# Patient Record
Sex: Male | Born: 1950
Health system: Southern US, Community
[De-identification: ages and names within clinical notes are randomized; demographics above are authoritative.]

## PROBLEM LIST (undated history)

## (undated) DIAGNOSIS — I5022 Chronic systolic (congestive) heart failure: Secondary | ICD-10-CM

## (undated) DIAGNOSIS — I350 Nonrheumatic aortic (valve) stenosis: Secondary | ICD-10-CM

## (undated) DIAGNOSIS — G4733 Obstructive sleep apnea (adult) (pediatric): Secondary | ICD-10-CM

## (undated) DIAGNOSIS — N183 Chronic kidney disease, stage 3 unspecified: Secondary | ICD-10-CM

## (undated) DIAGNOSIS — Z87442 Personal history of urinary calculi: Secondary | ICD-10-CM

## (undated) DIAGNOSIS — M1990 Unspecified osteoarthritis, unspecified site: Secondary | ICD-10-CM

## (undated) DIAGNOSIS — Z952 Presence of prosthetic heart valve: Secondary | ICD-10-CM

## (undated) DIAGNOSIS — K219 Gastro-esophageal reflux disease without esophagitis: Secondary | ICD-10-CM

## (undated) DIAGNOSIS — I2510 Atherosclerotic heart disease of native coronary artery without angina pectoris: Secondary | ICD-10-CM

## (undated) DIAGNOSIS — Z8673 Personal history of transient ischemic attack (TIA), and cerebral infarction without residual deficits: Secondary | ICD-10-CM

## (undated) DIAGNOSIS — I77810 Thoracic aortic ectasia: Secondary | ICD-10-CM

## (undated) DIAGNOSIS — I5043 Acute on chronic combined systolic (congestive) and diastolic (congestive) heart failure: Secondary | ICD-10-CM

## (undated) DIAGNOSIS — I4719 Other supraventricular tachycardia: Secondary | ICD-10-CM

## (undated) DIAGNOSIS — I471 Supraventricular tachycardia: Secondary | ICD-10-CM

## (undated) DIAGNOSIS — Z923 Personal history of irradiation: Secondary | ICD-10-CM

## (undated) DIAGNOSIS — E785 Hyperlipidemia, unspecified: Secondary | ICD-10-CM

## (undated) DIAGNOSIS — N189 Chronic kidney disease, unspecified: Secondary | ICD-10-CM

## (undated) DIAGNOSIS — D649 Anemia, unspecified: Secondary | ICD-10-CM

## (undated) DIAGNOSIS — I10 Essential (primary) hypertension: Secondary | ICD-10-CM

## (undated) DIAGNOSIS — C383 Malignant neoplasm of mediastinum, part unspecified: Secondary | ICD-10-CM

## (undated) DIAGNOSIS — N529 Male erectile dysfunction, unspecified: Secondary | ICD-10-CM

## (undated) HISTORY — DX: Supraventricular tachycardia: I47.1

## (undated) HISTORY — DX: Essential (primary) hypertension: I10

## (undated) HISTORY — PX: OTHER SURGICAL HISTORY: SHX169

## (undated) HISTORY — DX: Personal history of irradiation: Z92.3

## (undated) HISTORY — DX: Thoracic aortic ectasia: I77.810

## (undated) HISTORY — DX: Chronic kidney disease, unspecified: N18.9

## (undated) HISTORY — DX: Chronic kidney disease, stage 3 unspecified: N18.30

## (undated) HISTORY — DX: Other supraventricular tachycardia: I47.19

## (undated) HISTORY — DX: Male erectile dysfunction, unspecified: N52.9

## (undated) HISTORY — DX: Malignant neoplasm of mediastinum, part unspecified: C38.3

## (undated) HISTORY — DX: Atherosclerotic heart disease of native coronary artery without angina pectoris: I25.10

## (undated) HISTORY — PX: LITHOTRIPSY: SUR834

## (undated) HISTORY — DX: Chronic systolic (congestive) heart failure: I50.22

## (undated) HISTORY — DX: Hyperlipidemia, unspecified: E78.5

## (undated) HISTORY — DX: Obstructive sleep apnea (adult) (pediatric): G47.33

---

## 1983-08-16 DIAGNOSIS — Z923 Personal history of irradiation: Secondary | ICD-10-CM

## 1983-08-16 DIAGNOSIS — C383 Malignant neoplasm of mediastinum, part unspecified: Secondary | ICD-10-CM

## 1983-08-16 HISTORY — DX: Malignant neoplasm of mediastinum, part unspecified: C38.3

## 1983-08-16 HISTORY — PX: MEDIASTERNOTOMY: SHX5084

## 1983-08-16 HISTORY — DX: Personal history of irradiation: Z92.3

## 1998-02-10 ENCOUNTER — Ambulatory Visit (HOSPITAL_COMMUNITY): Admission: RE | Admit: 1998-02-10 | Discharge: 1998-02-10 | Payer: Self-pay | Admitting: Family Medicine

## 1998-02-11 ENCOUNTER — Ambulatory Visit (HOSPITAL_COMMUNITY): Admission: RE | Admit: 1998-02-11 | Discharge: 1998-02-11 | Payer: Self-pay | Admitting: Family Medicine

## 1999-04-20 ENCOUNTER — Encounter: Admission: RE | Admit: 1999-04-20 | Discharge: 1999-07-19 | Payer: Self-pay | Admitting: Family Medicine

## 2000-03-03 ENCOUNTER — Encounter: Admission: RE | Admit: 2000-03-03 | Discharge: 2000-06-01 | Payer: Self-pay | Admitting: Family Medicine

## 2000-06-02 ENCOUNTER — Encounter: Admission: RE | Admit: 2000-06-02 | Discharge: 2000-08-31 | Payer: Self-pay | Admitting: Family Medicine

## 2000-11-01 ENCOUNTER — Encounter: Admission: RE | Admit: 2000-11-01 | Discharge: 2001-01-30 | Payer: Self-pay | Admitting: Family Medicine

## 2002-02-22 ENCOUNTER — Encounter: Admission: RE | Admit: 2002-02-22 | Discharge: 2002-02-22 | Payer: Self-pay | Admitting: Gastroenterology

## 2002-02-22 ENCOUNTER — Encounter: Payer: Self-pay | Admitting: Gastroenterology

## 2003-09-29 ENCOUNTER — Encounter (HOSPITAL_BASED_OUTPATIENT_CLINIC_OR_DEPARTMENT_OTHER): Admission: RE | Admit: 2003-09-29 | Discharge: 2003-10-09 | Payer: Self-pay | Admitting: Internal Medicine

## 2008-08-11 ENCOUNTER — Encounter: Admission: RE | Admit: 2008-08-11 | Discharge: 2008-08-11 | Payer: Self-pay | Admitting: Family Medicine

## 2008-09-03 ENCOUNTER — Ambulatory Visit: Payer: Self-pay

## 2008-09-03 ENCOUNTER — Encounter (INDEPENDENT_AMBULATORY_CARE_PROVIDER_SITE_OTHER): Payer: Self-pay | Admitting: Neurology

## 2010-07-02 ENCOUNTER — Emergency Department: Payer: Self-pay | Admitting: Unknown Physician Specialty

## 2010-07-04 ENCOUNTER — Emergency Department: Payer: Self-pay | Admitting: Emergency Medicine

## 2010-08-12 ENCOUNTER — Ambulatory Visit (HOSPITAL_COMMUNITY)
Admission: RE | Admit: 2010-08-12 | Discharge: 2010-08-12 | Payer: Self-pay | Source: Home / Self Care | Attending: Gastroenterology | Admitting: Gastroenterology

## 2010-10-25 LAB — GLUCOSE, CAPILLARY
Glucose-Capillary: 128 mg/dL — ABNORMAL HIGH (ref 70–99)
Glucose-Capillary: 198 mg/dL — ABNORMAL HIGH (ref 70–99)

## 2010-12-31 NOTE — Consult Note (Signed)
NAME:  [name], [name] NO.:  [id number]   MEDICAL RECORD NO.:  [id number]                   PATIENT TYPE:  REC   LOCATION:  FOOT                                 FACILITY:  [facility]   PHYSICIAN:  Jonelle Sports. Sevier, M.D.              DATE OF BIRTH:  1951/05/27   DATE OF CONSULTATION:  10/02/2003  DATE OF DISCHARGE:                                   CONSULTATION   HISTORY:  This 60 year old white male was seen at the courtesy of Dr. Talmage Nap  for foot evaluation and education as well as to establish for ongoing nail  care.   The patient has had diabetes of some 12 years duration and apparently has  only recently decided to get serious about self care.  His A1C in early  January, 2004, was 9.1, but he states that he has made some substantial  changes in his behavior since that time and that recent blood sugars have  been consistent with below 150.  He apparently is exercising with some  regularity, although, his walking is on simply a three day a week basis.  He  apparently does not have hypertension.  He apparently does have some degree  of hyperlipidemia and is on Lipitor 5 mg per day.   The patient has not had any previous difficulty with his feet other than  some tingling in the toes which began some six years ago.  He has never  received any specific treatment for that (although, he is on amitriptyline,  he says that is to help with sleep).  He has never had significant callus  formation, ulcerations, or any significant deformity of the feet.   As stated above, he is here for education and to establish for routine nail  care.   PAST MEDICAL HISTORY:  Really is notable only for his diabetes with mild  neuropathy.   ALLERGIES:  He has no known medicinal allergies.   His medications include:  1. Lipitor as above.  2. Amitriptyline, an uncertain dose at bedtime.  3. __________, again, an uncertain dose on a p.r.n. basis.  4. Altace 5 mg daily (which is  apparently for renal protection).  5. Amaryl 4 mg daily.  6. Actos 45 mg daily.  7. Metformin 1000 mg b.i.d.  8. Lantus 35 units at bedtime.   EXAMINATION:  EXTREMITIES:  Examination today is limited to the distal lower  extremities.  The patient's feet are without significant deformity and free  of edema.  Skin temperatures are in the normal range and are symmetrical  throughout.  Pulses are everywhere palpable and quite adequate.  There is  good hair growth on the toes.  Other nails show no significant dystrophy.  On the plantar aspects of the feet, there is minor very early callus  formation that does not need attention at the fifth metatarsal head area on  the right and the  first metatarsal head area on the left.   1. Monofilament testing on the plantar aspects of the feet shows that there     is some variable response over the metatarsal heads but that he does have     protective sensation throughout the remainder of the feet.  2. Assessment of his footwear shows that his current shoes are probably at     least one-half size too short, and this is discussed with him.   IMPRESSION:  1. Diabetes mellitus type 2, uncontrolled but, hopefully, improving with     mild peripheral neuropathy.  2. Hyperlipidemia on treatment.   DISPOSITION:  1. The patient is given instruction regarding foot care and diabetes by     video with nurse and physician reinforcement.  2. His need to be sure of the adequacy of the length and width of his     footwear is again re-emphasized, and he is specifically instructed in his     current shoes to seek at least one-half size additional length when he     replaces them.  3. The patient is given nail care by the nurse today and will be set up for     routine nail care here on a three-month basis.  4. Follow up beyond that will be on a p.r.n. basis.                                               Jonelle Sports. Cheryll Cockayne, M.D.    RES/MEDQ  D:  10/02/2003  T:   10/03/2003  Job:  [id number]   cc:   Dorisann Frames, M.D.  [facility] [address], [place] [address]  Fax: [phone]

## 2012-02-13 DIAGNOSIS — I2510 Atherosclerotic heart disease of native coronary artery without angina pectoris: Secondary | ICD-10-CM | POA: Insufficient documentation

## 2012-02-13 HISTORY — DX: Atherosclerotic heart disease of native coronary artery without angina pectoris: I25.10

## 2012-03-06 ENCOUNTER — Encounter (HOSPITAL_COMMUNITY): Payer: Self-pay | Admitting: General Practice

## 2012-03-06 ENCOUNTER — Encounter: Payer: Self-pay | Admitting: Cardiology

## 2012-03-06 ENCOUNTER — Inpatient Hospital Stay (HOSPITAL_COMMUNITY)
Admission: EM | Admit: 2012-03-06 | Discharge: 2012-03-07 | DRG: 247 | Disposition: A | Discharge status: Home / Self Care | Payer: 59 | Source: Ambulatory Visit | Attending: Cardiology | Admitting: Cardiology

## 2012-03-06 ENCOUNTER — Encounter (HOSPITAL_COMMUNITY)
Admission: EM | Disposition: A | Discharge status: Home / Self Care | Payer: Self-pay | Source: Ambulatory Visit | Attending: Cardiology

## 2012-03-06 ENCOUNTER — Other Ambulatory Visit: Payer: Self-pay | Admitting: Cardiology

## 2012-03-06 DIAGNOSIS — E1142 Type 2 diabetes mellitus with diabetic polyneuropathy: Secondary | ICD-10-CM | POA: Diagnosis present

## 2012-03-06 DIAGNOSIS — R0602 Shortness of breath: Secondary | ICD-10-CM | POA: Diagnosis present

## 2012-03-06 DIAGNOSIS — I10 Essential (primary) hypertension: Secondary | ICD-10-CM | POA: Diagnosis present

## 2012-03-06 DIAGNOSIS — Z955 Presence of coronary angioplasty implant and graft: Secondary | ICD-10-CM

## 2012-03-06 DIAGNOSIS — I2584 Coronary atherosclerosis due to calcified coronary lesion: Secondary | ICD-10-CM | POA: Diagnosis present

## 2012-03-06 DIAGNOSIS — Z7902 Long term (current) use of antithrombotics/antiplatelets: Secondary | ICD-10-CM

## 2012-03-06 DIAGNOSIS — G4700 Insomnia, unspecified: Secondary | ICD-10-CM | POA: Diagnosis present

## 2012-03-06 DIAGNOSIS — E7800 Pure hypercholesterolemia, unspecified: Secondary | ICD-10-CM | POA: Diagnosis present

## 2012-03-06 DIAGNOSIS — Z79899 Other long term (current) drug therapy: Secondary | ICD-10-CM

## 2012-03-06 DIAGNOSIS — I214 Non-ST elevation (NSTEMI) myocardial infarction: Secondary | ICD-10-CM

## 2012-03-06 DIAGNOSIS — Z8673 Personal history of transient ischemic attack (TIA), and cerebral infarction without residual deficits: Secondary | ICD-10-CM

## 2012-03-06 DIAGNOSIS — E1149 Type 2 diabetes mellitus with other diabetic neurological complication: Secondary | ICD-10-CM | POA: Diagnosis present

## 2012-03-06 DIAGNOSIS — I2510 Atherosclerotic heart disease of native coronary artery without angina pectoris: Principal | ICD-10-CM

## 2012-03-06 DIAGNOSIS — I200 Unstable angina: Secondary | ICD-10-CM | POA: Diagnosis present

## 2012-03-06 HISTORY — DX: Gastro-esophageal reflux disease without esophagitis: K21.9

## 2012-03-06 HISTORY — PX: LEFT HEART CATHETERIZATION WITH CORONARY ANGIOGRAM: SHX5451

## 2012-03-06 LAB — GLUCOSE, CAPILLARY
Glucose-Capillary: 102 mg/dL — ABNORMAL HIGH (ref 70–99)
Glucose-Capillary: 91 mg/dL (ref 70–99)
Glucose-Capillary: 172 mg/dL — ABNORMAL HIGH (ref 70–99)

## 2012-03-06 LAB — CARDIAC PANEL(CRET KIN+CKTOT+MB+TROPI)
Relative Index: 1.6 (ref 0.0–2.5)
Relative Index: 1.6 (ref 0.0–2.5)
Total CK: 318 U/L — ABNORMAL HIGH (ref 7–232)
Troponin I: <0.3 ng/mL (ref <0.30)

## 2012-03-06 LAB — COMPREHENSIVE METABOLIC PANEL
AST: 15 U/L (ref 0–37)
Albumin: 3.7 g/dL (ref 3.5–5.2)
BUN: 17 mg/dL (ref 6–23)
Creatinine, Ser: 1.36 mg/dL — ABNORMAL HIGH (ref 0.50–1.35)
Potassium: 4.5 mEq/L (ref 3.5–5.1)
Total Protein: 6.9 g/dL (ref 6.0–8.3)

## 2012-03-06 LAB — CBC WITH DIFFERENTIAL/PLATELET
Basophils Absolute: 0 10*3/uL (ref 0.0–0.1)
Basophils Relative: 1 % (ref 0–1)
Eosinophils Absolute: 0.1 10*3/uL (ref 0.0–0.7)
Hemoglobin: 13.5 g/dL (ref 13.0–17.0)
MCH: 26.4 pg (ref 26.0–34.0)
MCHC: 33.4 g/dL (ref 30.0–36.0)
Monocytes Absolute: 0.2 10*3/uL (ref 0.1–1.0)
Monocytes Relative: 7 % (ref 3–12)
Neutro Abs: 1.3 10*3/uL — ABNORMAL LOW (ref 1.7–7.7)
Neutrophils Relative %: 43 % (ref 43–77)
RDW: 15.5 % (ref 11.5–15.5)

## 2012-03-06 LAB — POCT ACTIVATED CLOTTING TIME: Activated Clotting Time: 354 seconds

## 2012-03-06 LAB — PROTIME-INR
INR: 0.99 (ref 0.00–1.49)
Prothrombin Time: 13.3 seconds (ref 11.6–15.2)

## 2012-03-06 LAB — APTT: aPTT: 31 seconds (ref 24–37)

## 2012-03-06 LAB — MRSA PCR SCREENING: MRSA by PCR: NEGATIVE

## 2012-03-06 LAB — PLATELET INHIBITION P2Y12: Platelet Function  P2Y12: 97 [PRU] — ABNORMAL LOW (ref 194–418)

## 2012-03-06 SURGERY — LEFT HEART CATHETERIZATION WITH CORONARY ANGIOGRAM
Anesthesia: LOCAL

## 2012-03-06 MED ORDER — ONDANSETRON HCL 4 MG/2ML IJ SOLN: 4.0000 mg | Freq: Four times a day (QID) | INTRAMUSCULAR | Status: DC | PRN

## 2012-03-06 MED ORDER — ACETAMINOPHEN 325 MG PO TABS: 650.0000 mg | ORAL_TABLET | ORAL | Status: DC | PRN

## 2012-03-06 MED ORDER — ASPIRIN EC 81 MG PO TBEC: 81.0000 mg | DELAYED_RELEASE_TABLET | Freq: Every day | ORAL | Status: DC

## 2012-03-06 MED ORDER — LIDOCAINE HCL (PF) 1 % IJ SOLN
INTRAMUSCULAR | Status: AC
  Filled 2012-03-06: qty 30

## 2012-03-06 MED ORDER — MIDAZOLAM HCL 2 MG/2ML IJ SOLN
INTRAMUSCULAR | Status: AC
  Filled 2012-03-06: qty 2

## 2012-03-06 MED ORDER — BIVALIRUDIN 250 MG IV SOLR
INTRAVENOUS | Status: AC
  Filled 2012-03-06: qty 250

## 2012-03-06 MED ORDER — INSULIN DETEMIR 100 UNIT/ML ~~LOC~~ SOLN
40.0000 [IU] | Freq: Every day | SUBCUTANEOUS | Status: DC
  Administered 2012-03-06: 40 [IU] via SUBCUTANEOUS
  Filled 2012-03-06: qty 10

## 2012-03-06 MED ORDER — ASPIRIN 81 MG PO CHEW: 324.0000 mg | CHEWABLE_TABLET | ORAL | Status: DC

## 2012-03-06 MED ORDER — DIAZEPAM 2 MG PO TABS
2.0000 mg | ORAL_TABLET | ORAL | Status: AC
  Administered 2012-03-06: 2 mg via ORAL
  Filled 2012-03-06: qty 1

## 2012-03-06 MED ORDER — ACETAMINOPHEN 325 MG PO TABS
650.0000 mg | ORAL_TABLET | ORAL | Status: DC | PRN
  Filled 2012-03-06: qty 1

## 2012-03-06 MED ORDER — LISINOPRIL 2.5 MG PO TABS
2.5000 mg | ORAL_TABLET | Freq: Every day | ORAL | Status: DC
  Administered 2012-03-06 – 2012-03-07 (×2): 2.5 mg via ORAL
  Filled 2012-03-06 (×2): qty 1

## 2012-03-06 MED ORDER — INSULIN ASPART 100 UNIT/ML ~~LOC~~ SOLN
0.0000 [IU] | Freq: Three times a day (TID) | SUBCUTANEOUS | Status: DC
  Administered 2012-03-07: 3 [IU] via SUBCUTANEOUS

## 2012-03-06 MED ORDER — ASPIRIN EC 325 MG PO TBEC: 325.0000 mg | DELAYED_RELEASE_TABLET | Freq: Every day | ORAL | Status: DC

## 2012-03-06 MED ORDER — HEPARIN (PORCINE) IN NACL 100-0.45 UNIT/ML-% IJ SOLN
1000.0000 [IU]/h | INTRAMUSCULAR | Status: DC
Start: 2012-03-06 — End: 2012-03-06
  Administered 2012-03-06: 1000 [IU]/h via INTRAVENOUS
  Filled 2012-03-06: qty 250

## 2012-03-06 MED ORDER — SODIUM CHLORIDE 0.9 % IV SOLN
1.0000 mL/kg/h | INTRAVENOUS | Status: AC
  Administered 2012-03-06: 1 mL/kg/h via INTRAVENOUS

## 2012-03-06 MED ORDER — NITROGLYCERIN 0.4 MG SL SUBL: 0.4000 mg | SUBLINGUAL_TABLET | SUBLINGUAL | Status: DC | PRN

## 2012-03-06 MED ORDER — SODIUM CHLORIDE 0.9 % IV SOLN
INTRAVENOUS | Status: DC
  Administered 2012-03-06: 75 mL/h via INTRAVENOUS

## 2012-03-06 MED ORDER — AMITRIPTYLINE HCL 100 MG PO TABS
100.0000 mg | ORAL_TABLET | Freq: Every day | ORAL | Status: DC
  Filled 2012-03-06 (×2): qty 1

## 2012-03-06 MED ORDER — ASPIRIN 300 MG RE SUPP
300.0000 mg | RECTAL | Status: DC
  Filled 2012-03-06: qty 1

## 2012-03-06 MED ORDER — SODIUM CHLORIDE 0.9 % IV SOLN: 250.0000 mL | INTRAVENOUS | Status: DC | PRN

## 2012-03-06 MED ORDER — SODIUM CHLORIDE 0.9 % IJ SOLN: 3.0000 mL | INTRAMUSCULAR | Status: DC | PRN

## 2012-03-06 MED ORDER — HEPARIN BOLUS VIA INFUSION
4000.0000 [IU] | Freq: Once | INTRAVENOUS | Status: AC
  Administered 2012-03-06: 4000 [IU] via INTRAVENOUS
  Filled 2012-03-06: qty 4000

## 2012-03-06 MED ORDER — ZOLPIDEM TARTRATE 5 MG PO TABS: 10.0000 mg | ORAL_TABLET | Freq: Every evening | ORAL | Status: DC | PRN

## 2012-03-06 MED ORDER — SODIUM CHLORIDE 0.9 % IJ SOLN
3.0000 mL | Freq: Two times a day (BID) | INTRAMUSCULAR | Status: DC
  Administered 2012-03-06 – 2012-03-07 (×3): 3 mL via INTRAVENOUS

## 2012-03-06 MED ORDER — SIMVASTATIN 40 MG PO TABS
40.0000 mg | ORAL_TABLET | Freq: Every day | ORAL | Status: DC
  Filled 2012-03-06: qty 1

## 2012-03-06 MED ORDER — POLYSACCHARIDE IRON COMPLEX 150 MG PO CAPS
150.0000 mg | ORAL_CAPSULE | Freq: Every day | ORAL | Status: DC
  Administered 2012-03-06 – 2012-03-07 (×2): 150 mg via ORAL
  Filled 2012-03-06 (×2): qty 1

## 2012-03-06 MED ORDER — PANTOPRAZOLE SODIUM 40 MG PO TBEC
40.0000 mg | DELAYED_RELEASE_TABLET | Freq: Every day | ORAL | Status: DC
  Administered 2012-03-07: 40 mg via ORAL
  Filled 2012-03-06: qty 1

## 2012-03-06 MED ORDER — ATROPINE SULFATE 1 MG/ML IJ SOLN
INTRAMUSCULAR | Status: AC
  Filled 2012-03-06: qty 1

## 2012-03-06 MED ORDER — CLOPIDOGREL BISULFATE 300 MG PO TABS
ORAL_TABLET | ORAL | Status: AC
  Filled 2012-03-06: qty 1

## 2012-03-06 MED ORDER — FENTANYL CITRATE 0.05 MG/ML IJ SOLN
INTRAMUSCULAR | Status: AC
  Filled 2012-03-06: qty 2

## 2012-03-06 MED ORDER — CLOPIDOGREL BISULFATE 75 MG PO TABS: 75.0000 mg | ORAL_TABLET | Freq: Every day | ORAL | Status: DC

## 2012-03-06 MED ORDER — ASPIRIN EC 325 MG PO TBEC
325.0000 mg | DELAYED_RELEASE_TABLET | Freq: Every day | ORAL | Status: DC
  Administered 2012-03-06 – 2012-03-07 (×2): 325 mg via ORAL
  Filled 2012-03-06 (×2): qty 1

## 2012-03-06 MED ORDER — HEPARIN (PORCINE) IN NACL 2-0.9 UNIT/ML-% IJ SOLN
INTRAMUSCULAR | Status: AC
  Filled 2012-03-06: qty 2000

## 2012-03-06 MED ORDER — CLOPIDOGREL BISULFATE 75 MG PO TABS
75.0000 mg | ORAL_TABLET | Freq: Every day | ORAL | Status: DC
  Administered 2012-03-07: 75 mg via ORAL
  Filled 2012-03-06: qty 1

## 2012-03-06 MED ORDER — NITROGLYCERIN 0.2 MG/ML ON CALL CATH LAB
INTRAVENOUS | Status: AC
  Filled 2012-03-06: qty 1

## 2012-03-06 MED ORDER — MORPHINE SULFATE 2 MG/ML IJ SOLN: 1.0000 mg | INTRAMUSCULAR | Status: DC | PRN

## 2012-03-06 MED ORDER — SODIUM CHLORIDE 0.9 % IJ SOLN: 3.0000 mL | Freq: Two times a day (BID) | INTRAMUSCULAR | Status: DC

## 2012-03-06 NOTE — H&P (View-Only) (Signed)
Progress Notes     Patient: [name] Provider: Carmine Carrozza, MD  DOB: 06/25/1951 Age: 61 Y Sex: Male Date: 03/06/2012  Phone: [phone]   Address: [address], [place], [place]  Pcp: DIBAS KOIRALA       Subjective:     CC:    1. TT/urgent work in Dr. Koirala/cp.        HPI:  General:  The patient presents today for evaluation of chest pain. This has been occurring for about 6 months off and on but recently has been occurring more frequently and is now occurring daily with any type of exertion. Occasionally he will have some pressure on his chest when he lays down at night. He gets SOB with the CP but no nausea or diaphoresis. The pain is located midsternal with no radiation. He currently complains of 3/10 chest pain at rest which has been going on for about 30 minutes..        ROS:  See HPI, A twelve system review was perfomed at today's visit. For pertinent positives and negatives see HPI.       Medical History: Health maintenance, diabetes mellitus with neuropathy, (Dr Balan) sees Dr. Balan, Allergies, /insomnia, Erectile dysfunction, Hypertension, Elevated cholesterol, Kidney stones-lithotripsy plan april/2012,dr,kimbrough, Colon due 6-16, history of TIAs.        Gyn History:        OB History:        Surgical History: Mediastinal Seminoma removed (benign) 1985, colonoscopy- 02/11/10, 10/12/07, 10/18/06 , EGD- 08/05/10 , kidney stone 08/2010.        Hospitalization/Major Diagnostic Procedure: only as above , no hospitalizations in the past year in 2008 , not in past year 03/2011.        Family History: Father: deceased 76 yrs diabetes Mother: deceased 69 yrs diabetes, hypertension, CHF Brother 1: deceased 54 yrs Cancer Pancreatic Brother2: alive 56 yrs A + W Sister 1: alive 57 yrs A + W Sister 2: deceased 58 yrs diabetes  denies fam hx colon cancer, polyps, or liver dx-eb.       Social History:  General:  History of smoking  cigarettes: Former  smoker Quit in year 1986 no Smoking.  no Alcohol.  Caffeine: yes, coffee, 1 serving daily.  no Recreational drug use.  no Diet.  Exercise: yes, walks, eliptical, 4x a week.  Occupation: employed, [employer].  Marital Status: married, twice. This time for 15 years. "It is what it is.".  Children: 2, son (s).  Seat belt use: yes.  Originally from [place], [place].       Medications: Multi For Him Tablet 1 tablet once a day, Plavix 75 MG Tablet 1 tablet Once a day, Simvastatin 40 MG Tablet 1 tablet every evening Once a day, Amitriptyline HCl 100 MG Tablet 1tablet at bedtime, Ambien 10 MG Tablet 1 tablet at bedtime 30mintues before bedtime, qhs prn insomnia, Nu-Iron 150 MG Capsule 1 capsules once a day, metformin 500 mg tab 2 tablets twice a day, NovoLog 100 UNIT/ML Solution as directed , Levemir 100 UNIT/ML Solution 40units at bedtime, Lisinopril 2.5 MG Tablet 1 tablet once a day, Omeprazole 20 MG Capsule Delayed Release 1 capsule PRN, Nitroglycerin 0.4 mg 0.4 mg tablet 1 tablet as directed as directed every 5 minutes x 3 doses prn chest pain, Medication List reviewed and reconciled with the patient       Allergies: Lipitor: cramps: Side Effects.       Objective:       Vitals: Wt 190.6, Wt change 1.2 lb, Ht 69.75, BMI 27.54, Pulse sitting 106, BP sitting 102/72.       Examination:  Cardiology, General:  GENERAL APPEARANCE: pleasant, NAD.  HEENT: unremarkable.  CAROTID UPSTROKE: normal, no bruit.  JVD: flat.  HEART SOUNDS: regular, normal S1, S2, no S3 or S4.  MURMUR: absent.  LUNGS: no rales or wheezes.  ABDOMEN: soft, non tender, positive bowel sounds, no masses felt.  EXTREMITIES: no leg edema.  PERIPHERAL PULSES: 2 plus bilateral.        Assessment:     Assessment:  1. Chest pain - 786.50 (Primary)  2. Essential hypertension, benign - 401.1  3. SOBOE (shortness of breath on exertion) - 786.05    Plan:     1. Chest pain  I am very concerned about  his symptoms which are consistent with unstable angina. He currently having pain. I have recommended transporting the patient to the hospital to be admitted to a stepdown tele bed and plan cardiac cath later today. Risks and benefits of cardiac catheterization have been reviewed including risk of stroke, heart attack, death, bleeding, renal impariment and arterial damage. There was ample oppurtuny to answer questions. Alternatives were discussed. Patient understands and wishes to proceed. I wil Start IV Heparin GTT per pharmacy and IV NTG gtt as well as ASA. Hold Metformin. Continue Plavix/statin and ACE I.       2. Essential hypertension, benign Continue Lisinopril Tablet, 2.5 MG, 1 tablet, Orally, once a day .        Immunizations:        Labs:        Preventive:         Follow Up: admit      Provider: Addalyne Vandehei, MD  Patient: [name] DOB: 03/12/1951 Date: 03/06/2012    

## 2012-03-06 NOTE — CV Procedure (Signed)
PROCEDURE:  PCI proximal left circumflex and proximal OM1  INDICATIONS:  Unstable angina  The risks, benefits, and details of the procedure were explained to the patient.  The patient verbalized understanding and wanted to proceed.  Informed written consent was obtained.  Dr. Mayford Knife performed the diagnostic cath revealing a 90% circumflex lesion and a calcified 90% OM1 lesion.    PROCEDURE TECHNIQUE:  6Fr sheath used.  Angiomax for anticoagulation.  ACT was used to confirm that the Angiomax was therapeutic.  A CLS 3.5 guiding catheter was used to engage the left main.  A pro-water wire was placed across the entire diseased area in the proximal circumflex and OM1.  We attempted to advance an Angio-Sculpt 2.5 x 10 balloon.  This would not successfully crossed the proximal circumflex lesion.  A 2.5 x 12 emerge balloon was successfully used to predilate both lesions.  A 2.5 x 12 Promus element stent was advanced to the circumflex but would not cross the proximal lesion.  A BMW wire was placed down the vessel and the same stent still would not cross.  A guide liner was advanced to the proximal circumflex but would not advance into the mid circumflex or OM1.  The proximal circumflex was again predilated with a 2.5 x 12 emerge balloon. There was some moderate disease and likely tortuosity in the proximal circumflex which is making delivery of devices difficult.  A 2.25 x 12 xience expedition stent was then successfully advanced to the OM1 after the BMW wire was replaced across the area of disease.  This was deployed at 18 atmospheres.  A 2.25 x 12 sinus expedition stent was then used to try to stent the proximal circumflex lesion.  There is difficulty in getting this stent to go down across the area of disease.  The BMW wire was replaced and with a deep breath, the stent went to the proximal circumflex lesion.  This was deployed at 20 atmospheres.  A 3.0 x 8 [place] trek balloon was used to post dilate both stented  areas.  The distal area was postdilated to 10 atmospheres.  The proximal stent was postdilated to 14 atmospheres.  Several doses of intracoronary nitroglycerin were answered to treat vasospasm in the proximal circumflex which was caused by multiple attempts at advancing equipment.   CONTRAST:  Total of 280 cc.  COMPLICATIONS:  None.      IMPRESSIONS:   1. Severe disease in the proximal left circumflex artery and proximal first obtuse marginal.  These 2 areas were spot stented with two 2.25 x 12 science expedition stents.  There were both postdilated to greater than 3 mm in diameter.  There was significant difficulty in delivering stents through this heavily calcified artery.  There is moderate disease in the remainder of the OM1.  RECOMMENDATION:  The patient will need aggressive medical therapy.  He'll be hydrated and watched overnight.  Continue aggressive diabetes control.  He'll need dual antiplatelet therapy for at least a year.  He already takes Plavix on a daily basis.

## 2012-03-06 NOTE — CV Procedure (Signed)
PROCEDURE:  Left heart catheterization with selective coronary angiography, left ventriculogram.  INDICATIONS:    The risks, benefits, and details of the procedure were explained to the patient.  The patient verbalized understanding and wanted to proceed.  Informed written consent was obtained.  PROCEDURE TECHNIQUE:  After Xylocaine anesthesia a 68F sheath was placed in the right femoral artery with a single anterior needle wall stick.   Left coronary angiography was done using a Judkins L4 guide catheter.  Right coronary angiography was done using a Judkins R4 guide catheter.  Left ventriculography was done using a pigtail catheter.    CONTRAST:  Total of 100 cc.  COMPLICATIONS:  None.    HEMODYNAMICS:  Aortic pressure was 125/34mmHg; LV pressure was 131/16mmHg; LVEDP .  There was no gradient between the left ventricle and aorta.    ANGIOGRAPHIC DATA:   The left main coronary artery is short and calcified but widely patent.  It bifurcates into a left circumflex and left anterior descending arteries.  The left anterior descending artery is calcified in its proximal portion.  It is widely patent and gives rise to a large diagonal #1 which is widely patent and a diagonal #2 which is patent as well.  The ongoing LAD is patent and gives rise to a diagonal #3 which is moderate in size and patent.    The left circumflex artery is calcified.  There is an 80% stenosis proximally before the takeoff of a very large OM branch.  The left circumflex then gives rise to a large OM branch which has an 80% proximal stenosis.  The ongoing left circumflex has 99% stenosis ans is a moderate sized vessel.  The OM branch is the largest of the left circ system supplying a large area of myocardium posterolaterally.  The right coronary artery is calcified and gives rise to an acute RV marginal branch.  The mid RCA has a 60% narrowing and distally is patent and bifurcates into a PL and PDA branches which are  patent.  LEFT VENTRICULOGRAM:  Left ventricular angiogram was done in the 30 RAO projection and revealed normal left ventricular wall motion and systolic function with an estimated ejection fraction of 55%.  LVEDP was 10 mmHg.  IMPRESSIONS:  1. Calcified and short patent left main coronary artery. 2. Normal left anterior descending artery and its branches. 3. 80% proximal left circumflex and 99% distal left circumflex stenosis.  80% proximal OM1 stenosis. 4. 60% stenosis of mid RCA 5. Normal left ventricular systolic function.  LVEDP 10 mmHg.  Ejection fraction 55%.  RECOMMENDATION:   Films were reviewed with Dr. Eldridge Dace and will proceed with PCI of left circumflex/OM. Continue Plavix and ASA

## 2012-03-06 NOTE — Progress Notes (Signed)
ANTICOAGULATION CONSULT NOTE - Initial Consult  Pharmacy Consult for heparin Indication: chest pain/ACS  Allergies  Allergen Reactions  . Lipitor (Atorvastatin) Other (See Comments)    myalgias    Patient Measurements: Height: 5\' 11"  (180.3 cm) Weight: 183 lb 6.8 oz (83.2 kg) IBW/kg (Calculated) : 75.3  Heparin Dosing Weight:83.2 kg  Vital Signs: BP: 123/66 mmHg (07/23 1300) Pulse Rate: 112  (07/23 1300)  Labs: No results found for this basename: HGB:2,HCT:3,PLT:3,APTT:3,LABPROT:3,INR:3,HEPARINUNFRC:3,CREATININE:3,CKTOTAL:3,CKMB:3,TROPONINI:3 in the last 72 hours  CrCl is unknown because no creatinine reading has been taken.   Medical History: Past Medical History  Diagnosis Date  . Diabetes mellitus     neuropathy  . Erectile dysfunction   . Hypertension   . Dyslipidemia   . Chronic kidney disease     kidney stones s/p lithotripsy  . Stroke     TIA's  . Cancer     mediastinal seminoma resected 1985 - benign    Medications:  Prescriptions prior to admission  Medication Sig Dispense Refill  . amitriptyline (ELAVIL) 100 MG tablet Take 100 mg by mouth at bedtime.      . clopidogrel (PLAVIX) 75 MG tablet Take 75 mg by mouth daily.      . insulin detemir (LEVEMIR) 100 UNIT/ML injection Inject 40 Units into the skin at bedtime.      . iron polysaccharides (NIFEREX) 150 MG capsule Take 150 mg by mouth daily.      Marland Kitchen lisinopril (PRINIVIL,ZESTRIL) 2.5 MG tablet Take 2.5 mg by mouth daily.      . metFORMIN (GLUCOPHAGE) 500 MG tablet Take 500 mg by mouth 2 (two) times daily with a meal. 2 tablets twice daily      . omeprazole (PRILOSEC) 20 MG capsule Take 20 mg by mouth daily as needed.      . simvastatin (ZOCOR) 40 MG tablet Take 40 mg by mouth every evening.      . zolpidem (AMBIEN) 10 MG tablet Take 10 mg by mouth at bedtime as needed.        Assessment: 61 yo man admitted with chest pain, r/o [place] to start heparin drip. Wt 83.2 kg.  No baseline labs drawn yet.  Goal  of Therapy:  Heparin level 0.3-0.7 units/ml Monitor platelets by anticoagulation protocol: Yes   Plan:  Give 4000 units bolus x 1 Start heparin infusion at 1000 units/hr Check anti-Xa level in 6 hours and daily while on heparin Continue to monitor H&H and platelets

## 2012-03-06 NOTE — Interval H&P Note (Signed)
History and Physical Interval Note:  03/06/2012 3:53 PM  [name]  has presented today for surgery, with the diagnosis of chest pain  The various methods of treatment have been discussed with the patient and family. After consideration of risks, benefits and other options for treatment, the patient has consented to  Procedure(s) (LRB): LEFT HEART CATHETERIZATION WITH CORONARY ANGIOGRAM (N/A) as a surgical intervention .  The patient's history has been reviewed, patient examined, no change in status, stable for surgery.  I have reviewed the patient's chart and labs.  Questions were answered to the patient's satisfaction.     TURNER,[name]

## 2012-03-06 NOTE — H&P (Signed)
Progress Notes     Patient: [name] [name] Provider: Armanda Magic, MD  DOB: Oct 09, 1950 Age: 61 Y Sex: Male Date: 03/06/2012  Phone: [phone]   Address: [address] [place], [place]  Pcp: Darrow Bussing       Subjective:     CC:    1. TT/urgent work in Dr. Ranell Patrick.        HPI:  General:  The patient presents today for evaluation of chest pain. This has been occurring for about 6 months off and on but recently has been occurring more frequently and is now occurring daily with any type of exertion. Occasionally he will have some pressure on his chest when he lays down at night. He gets SOB with the CP but no nausea or diaphoresis. The pain is located midsternal with no radiation. He currently complains of 3/10 chest pain at rest which has been going on for about 30 minutes..        ROS:  See HPI, A twelve system review was perfomed at today's visit. For pertinent positives and negatives see HPI.       Medical History: Health maintenance, diabetes mellitus with neuropathy, (Dr Talmage Nap) sees Dr. Talmage Nap, Allergies, [place] Ahr, Erectile dysfunction, Hypertension, Elevated cholesterol, Kidney stones-lithotripsy plan april/2012,dr,kimbrough, Colon due 6-16, history of TIAs.        Gyn History:        OB History:        Surgical History: Mediastinal Seminoma removed (benign) 1985, colonoscopy- 02/11/10, 10/12/07, 10/18/06 , EGD- 08/05/10 , kidney stone 08/2010.        Hospitalization/Major Diagnostic Procedure: only as above , no hospitalizations in the past year in 2008 , not in past year 03/2011.        Family History: Father: deceased 41 yrs diabetes Mother: deceased 66 yrs diabetes, hypertension, CHF Brother 1: deceased 61 yrs Cancer Pancreatic Brother2: alive 46 yrs A + W Sister 1: alive 59 yrs A + W Sister 2: deceased 80 yrs diabetes  denies fam hx colon cancer, polyps, or liver dx-eb.       Social History:  General:  History of smoking  cigarettes: Former  smoker Quit in year 1986 no Smoking.  no Alcohol.  Caffeine: yes, coffee, 1 serving daily.  no Recreational drug use.  no Diet.  Exercise: yes, walks, eliptical, 4x a week.  Occupation: employed, [employer].  Marital Status: married, twice. This time for 15 years. "It is what it is.".  Children: 2, son (s).  Seat belt use: yes.  Originally from [place], [place].       Medications: Multi For Him Tablet 1 tablet once a day, Plavix 75 MG Tablet 1 tablet Once a day, Simvastatin 40 MG Tablet 1 tablet every evening Once a day, Amitriptyline HCl 100 MG Tablet 1tablet at bedtime, Ambien 10 MG Tablet 1 tablet at bedtime before bedtime, qhs prn insomnia, Nu-Iron 150 MG Capsule 1 capsules once a day, metformin 500 mg tab 2 tablets twice a day, NovoLog 100 UNIT/ML Solution as directed , Levemir 100 UNIT/ML Solution 40units at bedtime, Lisinopril 2.5 MG Tablet 1 tablet once a day, Omeprazole 20 MG Capsule Delayed Release 1 capsule PRN, Nitroglycerin 0.4 mg 0.4 mg tablet 1 tablet as directed as directed every 5 minutes x 3 doses prn chest pain, Medication List reviewed and reconciled with the patient       Allergies: Lipitor: cramps: Side Effects.       Objective:  Vitals: Wt 190.6, Wt change 1.2 lb, Ht 69.75, BMI 27.54, Pulse sitting 106, BP sitting 102/72.       Examination:  Cardiology, General:  GENERAL APPEARANCE: pleasant, NAD.  HEENT: unremarkable.  CAROTID UPSTROKE: normal, no bruit.  JVD: flat.  HEART SOUNDS: regular, normal S1, S2, no S3 or S4.  MURMUR: absent.  LUNGS: no rales or wheezes.  ABDOMEN: soft, non tender, positive bowel sounds, no masses felt.  EXTREMITIES: no leg edema.  PERIPHERAL PULSES: 2 plus bilateral.        Assessment:     Assessment:  1. Chest pain - 786.50 (Primary)  2. Essential hypertension, benign - 401.1  3. SOBOE (shortness of breath on exertion) - 786.05    Plan:     1. Chest pain  I am very concerned about  his symptoms which are consistent with unstable angina. He currently having pain. I have recommended transporting the patient to the hospital to be admitted to a stepdown tele bed and plan cardiac cath later today. Risks and benefits of cardiac catheterization have been reviewed including risk of stroke, heart attack, death, bleeding, renal impariment and arterial damage. There was ample oppurtuny to answer questions. Alternatives were discussed. Patient understands and wishes to proceed. I wil Start IV Heparin GTT per pharmacy and IV NTG gtt as well as ASA. Hold Metformin. Continue Plavix/statin and ACE I.       2. Essential hypertension, benign Continue Lisinopril Tablet, 2.5 MG, 1 tablet, Orally, once a day .        Immunizations:        Labs:        Preventive:         Follow Up: admit      Provider: Armanda Magic, MD  Patient: [name] [name] DOB: [birth year] Date: 03/06/2012

## 2012-03-07 LAB — BASIC METABOLIC PANEL
Calcium: 8.7 mg/dL (ref 8.4–10.5)
GFR calc Af Amer: 63 mL/min — ABNORMAL LOW (ref >90)
GFR calc non Af Amer: 55 mL/min — ABNORMAL LOW (ref >90)
Glucose, Bld: 173 mg/dL — ABNORMAL HIGH (ref 70–99)
Potassium: 4.2 mEq/L (ref 3.5–5.1)
Sodium: 142 mEq/L (ref 135–145)

## 2012-03-07 LAB — HEMOGLOBIN A1C
Hgb A1c MFr Bld: 8.5 % — ABNORMAL HIGH (ref <5.7)
Hgb A1c MFr Bld: 8.4 % — ABNORMAL HIGH (ref <5.7)
Mean Plasma Glucose: 197 mg/dL — ABNORMAL HIGH (ref <117)
Mean Plasma Glucose: 194 mg/dL — ABNORMAL HIGH (ref <117)

## 2012-03-07 LAB — CARDIAC PANEL(CRET KIN+CKTOT+MB+TROPI)
Relative Index: 2.6 — ABNORMAL HIGH (ref 0.0–2.5)
Total CK: 232 U/L (ref 7–232)
Troponin I: 0.37 ng/mL (ref <0.30)

## 2012-03-07 LAB — CBC
Hemoglobin: 12.8 g/dL — ABNORMAL LOW (ref 13.0–17.0)
MCH: 26.4 pg (ref 26.0–34.0)
MCHC: 33.4 g/dL (ref 30.0–36.0)
MCV: 79.1 fL (ref 78.0–100.0)
RBC: 4.84 MIL/uL (ref 4.22–5.81)

## 2012-03-07 LAB — TSH: TSH: 3.55 u[IU]/mL (ref 0.350–4.500)

## 2012-03-07 LAB — GLUCOSE, CAPILLARY: Glucose-Capillary: 90 mg/dL (ref 70–99)

## 2012-03-07 MED ORDER — ASPIRIN 325 MG PO TBEC: 325.0000 mg | DELAYED_RELEASE_TABLET | Freq: Every day | ORAL | Status: AC

## 2012-03-07 MED ORDER — NITROGLYCERIN 0.4 MG SL SUBL: 0.4000 mg | SUBLINGUAL_TABLET | SUBLINGUAL | Status: DC | PRN

## 2012-03-07 MED ORDER — METOPROLOL SUCCINATE ER 25 MG PO TB24: 25.0000 mg | ORAL_TABLET | Freq: Every day | ORAL | Status: DC

## 2012-03-07 MED ORDER — METFORMIN HCL 500 MG PO TABS: 1000.0000 mg | ORAL_TABLET | Freq: Two times a day (BID) | ORAL | Status: DC

## 2012-03-07 MED ORDER — METOPROLOL SUCCINATE ER 25 MG PO TB24
25.0000 mg | ORAL_TABLET | Freq: Every day | ORAL | Status: DC
  Administered 2012-03-07: 25 mg via ORAL
  Filled 2012-03-07: qty 1

## 2012-03-07 MED FILL — Dextrose Inj 5%: INTRAVENOUS | Qty: 1000 | Status: AC

## 2012-03-07 NOTE — Progress Notes (Signed)
Cardiac cath lab sheath removal note  Site area: R groin Site prior to removal: level 0 Manual Pressure applied for: Minutes beginning: 2026 Pt status during sheath pull: stable Post sheath removal groin site: level 0 Post cath instructions given Post pulses present Pressure dressing applied  Pt remained stable throughout whole sheath pull;   Paul Half, RN

## 2012-03-07 NOTE — Discharge Summary (Signed)
Patient ID: [name] MRN: [id number] DOB/AGE: May 22, 1951 61 y.o.  Admit date: 03/06/2012 Discharge date: 03/07/2012  Primary Discharge Diagnosis  Coronary Artery Disease Secondary Discharge Diagnosis  DM with Neuropathy  Hypertension  Erectile dysfunction  Dyslipidemia   Nephrolithiasis  TIA's  Allergies     Significant Diagnostic Studies: angiography:  PROCEDURE: Left heart catheterization with selective coronary angiography, left ventriculogram.  INDICATIONS:  The risks, benefits, and details of the procedure were explained to the patient. The patient verbalized understanding and wanted to proceed. Informed written consent was obtained.  PROCEDURE TECHNIQUE: After Xylocaine anesthesia a 82F sheath was placed in the right femoral artery with a single anterior needle wall stick. Left coronary angiography was done using a Judkins L4 guide catheter. Right coronary angiography was done using a Judkins R4 guide catheter. Left ventriculography was done using a pigtail catheter.  CONTRAST: Total of 100 cc.  COMPLICATIONS: None.  HEMODYNAMICS: Aortic pressure was 125/70mmHg; LV pressure was 131/101mmHg; LVEDP . There was no gradient between the left ventricle and aorta.  ANGIOGRAPHIC DATA: The left main coronary artery is short and calcified but widely patent. It bifurcates into a left circumflex and left anterior descending arteries.  The left anterior descending artery is calcified in its proximal portion. It is widely patent and gives rise to a large diagonal #1 which is widely patent and a diagonal #2 which is patent as well. The ongoing LAD is patent and gives rise to a diagonal #3 which is moderate in size and patent.  The left circumflex artery is calcified. There is an 80% stenosis proximally before the takeoff of a very large OM branch. The left circumflex then gives rise to a large OM branch which has an 80% proximal stenosis. The ongoing left circumflex has 99% stenosis ans is  a moderate sized vessel. The OM branch is the largest of the left circ system supplying a large area of myocardium posterolaterally.  The right coronary artery is calcified and gives rise to an acute RV marginal branch. The mid RCA has a 60% narrowing and distally is patent and bifurcates into a PL and PDA branches which are patent.  LEFT VENTRICULOGRAM: Left ventricular angiogram was done in the 30 RAO projection and revealed normal left ventricular wall motion and systolic function with an estimated ejection fraction of 55%. LVEDP was 10 mmHg.  IMPRESSIONS:  1. Calcified and short patent left main coronary artery. 2. Normal left anterior descending artery and its branches. 3. 80% proximal left circumflex and 99% distal left circumflex stenosis. 80% proximal OM1 stenosis. 4. 60% stenosis of mid RCA 5. Normal left ventricular systolic function. LVEDP 10 mmHg. Ejection fraction 55%. RECOMMENDATION:  Films were reviewed with Dr. Eldridge Dace and will proceed with PCI of left circumflex/OM.  Continue Plavix and ASA        Consults: None  Hospital Course  This is a 61yo Bm with a history of DM, TIA's and HTN who presented to the office with symptoms of angina for the past 6 months that had increased in frequency to almost daily with exertion.  He had had 30 minutes of constant CP and SOB prior to being seen in the office with normal EKG.  He was admitted to the hospital and ruled in for small NSTEMI.  He underwent cath revealing obstructive ASCAD with high grade lesions in the proximal left circumflex, OM2 and distal left circumflex.  He had nonobstructive disease of RCA.  He underwent PCI of the left circumflex and  OM by Dr. Eldridge Dace.  He has had no further chest pain since the [facility] and on day of discharge was ambulating without difficulty.    Discharge Exam: Blood pressure 113/71, pulse 113, temperature 97.9 F (36.6 C), temperature source Oral, resp. rate 19, height 5\' 11"  (1.803 m), weight 83.2 kg  (183 lb 6.8 oz), SpO2 97.00%.   General appearance: alert Resp: clear to auscultation bilaterally Cardio: regular rate and rhythm, S1, S2 normal, no murmur, click, rub or gallop GI: soft, non-tender; bowel sounds normal; no masses,  no organomegaly Extremities: extremities normal, atraumatic, no cyanosis or edema Labs:   Lab Results  Component Value Date   WBC 4.1 03/07/2012   HGB 12.8* 03/07/2012   HCT 38.3* 03/07/2012   MCV 79.1 03/07/2012   PLT 208 03/07/2012    Lab 03/07/12 0221 03/06/12 1410  NA 142 --  K 4.2 --  CL 110 --  CO2 23 --  BUN 17 --  CREATININE 1.35 --  CALCIUM 8.7 --  PROT -- 6.9  BILITOT -- 0.2*  ALKPHOS -- 74  ALT -- 13  AST -- 15  GLUCOSE 173* --   Lab Results  Component Value Date   CKTOTAL 198 03/07/2012   CKMB 6.2* 03/07/2012   TROPONINI 0.74* 03/07/2012        Radiology: NONE EKG:  Sinus tachycardia no ST abnormality  FOLLOW UP PLANS AND APPOINTMENTS Discharge Orders    Future Orders Please Complete By Expires   Amb Referral to Cardiac Rehabilitation      Diet - low sodium heart healthy      Increase activity slowly      Driving Restrictions      Comments:   No driving for 2 weeks   Lifting restrictions      Comments:   No lifting more than 10 pounds for 2 week   Other Restrictions      Comments:   Do not return to work until seen by Dr. Mayford Knife     Medication List  As of 03/07/2012  3:04 PM   TAKE these medications         amitriptyline 100 MG tablet   Commonly known as: ELAVIL   Take 100 mg by mouth at bedtime.      aspirin 325 MG EC tablet   Take 1 tablet (325 mg total) by mouth daily.      clopidogrel 75 MG tablet   Commonly known as: PLAVIX   Take 75 mg by mouth daily.      insulin detemir 100 UNIT/ML injection   Commonly known as: LEVEMIR   Inject 40 Units into the skin at bedtime.      iron polysaccharides 150 MG capsule   Commonly known as: NIFEREX   Take 150 mg by mouth daily.      lisinopril 2.5 MG tablet    Commonly known as: PRINIVIL,ZESTRIL   Take 2.5 mg by mouth daily.      metFORMIN 500 MG tablet   Commonly known as: GLUCOPHAGE   Take 2 tablets (1,000 mg total) by mouth 2 (two) times daily with a meal. Do not restart Metformin until 7/26      metoprolol succinate 25 MG 24 hr tablet   Commonly known as: TOPROL-XL   Take 1 tablet (25 mg total) by mouth daily.      nitroGLYCERIN 0.4 MG SL tablet   Commonly known as: NITROSTAT   Place 1 tablet (0.4 mg total) under the tongue every  5 (five) minutes x 3 doses as needed for chest pain.      omeprazole 20 MG capsule   Commonly known as: PRILOSEC   Take 20 mg by mouth daily as needed. For heart burn      simvastatin 40 MG tablet   Commonly known as: ZOCOR   Take 40 mg by mouth every evening.      zolpidem 10 MG tablet   Commonly known as: AMBIEN   Take 10 mg by mouth at bedtime as needed. For sleep           Follow-up Information    Follow up with Quintella Reichert, MD on 03/19/2012. (at 9:15am)    Contact information:   [address] [place] [address] [phone]          BRING ALL MEDICATIONS WITH YOU TO FOLLOW UP APPOINTMENTS  Time spent with patient to include physician time:45 minutes Signed: Quintella Reichert 03/07/2012, 3:04 PM

## 2012-03-07 NOTE — Progress Notes (Signed)
Discharge teaching complete. Patient verbalized understanding.

## 2012-03-07 NOTE — Progress Notes (Signed)
CARDIAC REHAB PHASE I   PRE:  Rate/Rhythm: 101 ST  BP:  Supine:   Sitting: 113/71  Standing:    SaO2:   MODE:  Ambulation: 350 ft   POST:  Rate/Rhythem: 106 ST  BP:  Supine:   Sitting: 125/70  Standing:    SaO2:  1150-1310 Pt tolerated ambulation well without c/o of cp or SOB. HR after walk 106 ST. Pt back to recliner after walk with call light in reach. Completed MI education with pt and friend. He agrees to [employer] in [place], will send referral.  Beatrix Fetters

## 2012-03-07 NOTE — Progress Notes (Signed)
CRITICAL VALUE ALERT  Critical value received:  CK MB 6.1 Troponin 0.37  Date of notification:  03/07/2013  Time of notification:  0530  Critical value read back: yes  Nurse who received alert:  [place] Nahzir Pohle RN  MD notified (1st page):  Dr. Anne Fu  Time of first page:  0622  Responding MD:  Dr. Anne Fu  Time MD responded:  [phone]

## 2012-03-22 ENCOUNTER — Encounter (HOSPITAL_COMMUNITY)
Admission: RE | Admit: 2012-03-22 | Discharge: 2012-03-22 | Disposition: A | Discharge status: Home / Self Care | Payer: 59 | Source: Ambulatory Visit | Attending: Cardiology | Admitting: Cardiology

## 2012-03-22 DIAGNOSIS — R079 Chest pain, unspecified: Secondary | ICD-10-CM | POA: Insufficient documentation

## 2012-03-22 DIAGNOSIS — I200 Unstable angina: Secondary | ICD-10-CM | POA: Insufficient documentation

## 2012-03-22 DIAGNOSIS — Z5189 Encounter for other specified aftercare: Secondary | ICD-10-CM | POA: Insufficient documentation

## 2012-03-22 NOTE — Progress Notes (Signed)
Cardiac Rehab Medication Review by a Pharmacist  Does the patient  feel that his/her medications are working for him/her?  yes  Has the patient been experiencing any side effects to the medications prescribed?  Yes. Patient sometimes feels short of breath and thinks it may be related to amitriptyline and ambien.  Does the patient measure his/her own blood pressure or blood glucose at home?  yes   Does the patient have any problems obtaining medications due to transportation or finances?   no  Understanding of regimen: good Understanding of indications: good Potential of compliance: good    Pharmacist comments: Patient has good understanding of medications.  Went over some of his newer meds such as toprol and plavix.  Advised patient to separate his multivitamin and iron to avoid drug-drug interaction (binding).  Lillia Pauls, PharmD Clinical Pharmacist Pager: [phone] Phone: [phone] 03/22/2012 8:40 AM

## 2012-03-26 ENCOUNTER — Encounter (HOSPITAL_COMMUNITY): Payer: 59

## 2012-03-26 ENCOUNTER — Encounter (HOSPITAL_COMMUNITY)
Admission: RE | Admit: 2012-03-26 | Discharge: 2012-03-26 | Disposition: A | Discharge status: Home / Self Care | Payer: 59 | Source: Ambulatory Visit | Attending: Cardiology | Admitting: Cardiology

## 2012-03-26 ENCOUNTER — Encounter (HOSPITAL_COMMUNITY): Payer: Self-pay

## 2012-03-26 LAB — GLUCOSE, CAPILLARY
Glucose-Capillary: 248 mg/dL — ABNORMAL HIGH (ref 70–99)
Glucose-Capillary: 227 mg/dL — ABNORMAL HIGH (ref 70–99)

## 2012-03-26 NOTE — Progress Notes (Signed)
Pt started cardiac rehab today.  Pt tolerated light exercise without difficulty.  VSS, telemetry -NSR.  Pt oriented to exercise equipment and routine.  Understanding verbalized.  Will continue to monitor the patient throughout  the program.

## 2012-03-28 ENCOUNTER — Encounter (HOSPITAL_COMMUNITY): Payer: 59

## 2012-03-30 ENCOUNTER — Encounter (HOSPITAL_COMMUNITY)
Admission: RE | Admit: 2012-03-30 | Discharge: 2012-03-30 | Disposition: A | Discharge status: Home / Self Care | Payer: 59 | Source: Ambulatory Visit | Attending: Cardiology | Admitting: Cardiology

## 2012-03-30 ENCOUNTER — Encounter (HOSPITAL_COMMUNITY): Payer: 59

## 2012-03-30 LAB — GLUCOSE, CAPILLARY
Glucose-Capillary: 76 mg/dL (ref 70–99)
Glucose-Capillary: 86 mg/dL (ref 70–99)
Glucose-Capillary: 156 mg/dL — ABNORMAL HIGH (ref 70–99)

## 2012-03-30 NOTE — Progress Notes (Signed)
CBG 156 after eating breakfast.  [name] exercised without difficulty or complaints.

## 2012-04-02 ENCOUNTER — Encounter (HOSPITAL_COMMUNITY): Admission: RE | Admit: 2012-04-02 | Payer: 59 | Source: Ambulatory Visit

## 2012-04-02 ENCOUNTER — Encounter (HOSPITAL_COMMUNITY): Payer: 59

## 2012-04-02 NOTE — Progress Notes (Addendum)
Nutrition Consult Consult for pre-exercise CBG of 419 mg/dL. Pre-exercise CBG's have been 76-413 mg/dL (2 of 4 WNL). Per discussion with pt, pt forgot to take his Novolog this am due to saying good-bye to company "my sister was leaving and I completely forgot." Pt states he ate "a big bowl of Frosted Flakes with 1% milk and peanuts because I was afraid my blood sugar was going to be too low after last time." Healthier breakfast choices discussed. Pt reports he does not like " a lot of the healthy choices." Per pt, his wife is a Engineer, civil (consulting) and is very supportive of dietary changes needed. "My wife cooks healthy food, I just don't like it." Pt encouraged to take dietary changes slowly so he can learn to like healthier food. Pt likes fruit over vegetables so pt encouraged to incorporate more fruit. Pt works 3rd shift and reports he does not eat "anything" at night. Pt encouraged to incorporate a healthy nighttime meal to help promote better eating choices at his next meal. Per discussion, pt agreed to try either Cheerios, apple slices, and peanuts or an Egg McMuffin sandwich or a peanut butter sandwich on wheat bread. Pt expressed understanding of the above information reviewed. Continue client-centered nutrition education by RD as part of interdisciplinary care.  Monitor and evaluate progress toward nutrition goal with team.

## 2012-04-04 ENCOUNTER — Encounter (HOSPITAL_COMMUNITY): Payer: 59

## 2012-04-04 ENCOUNTER — Encounter (HOSPITAL_COMMUNITY)
Admission: RE | Admit: 2012-04-04 | Discharge: 2012-04-04 | Disposition: A | Discharge status: Home / Self Care | Payer: 59 | Source: Ambulatory Visit | Attending: Cardiology | Admitting: Cardiology

## 2012-04-04 LAB — GLUCOSE, CAPILLARY: Glucose-Capillary: 163 mg/dL — ABNORMAL HIGH (ref 70–99)

## 2012-04-04 NOTE — Progress Notes (Signed)
Improved blood glucose reading -234.  Pt able to proceed with exercise.  Pt post blood sugar 126.

## 2012-04-06 ENCOUNTER — Encounter (HOSPITAL_COMMUNITY): Payer: 59

## 2012-04-06 ENCOUNTER — Encounter (HOSPITAL_COMMUNITY)
Admission: RE | Admit: 2012-04-06 | Discharge: 2012-04-06 | Disposition: A | Discharge status: Home / Self Care | Payer: 59 | Source: Ambulatory Visit | Attending: Cardiology | Admitting: Cardiology

## 2012-04-06 LAB — GLUCOSE, CAPILLARY: Glucose-Capillary: 203 mg/dL — ABNORMAL HIGH (ref 70–99)

## 2012-04-06 NOTE — Progress Notes (Signed)
Reviewed home exercise with pt today.  Pt plans to walk and use elliptical at home for exercise.  Reviewed THR, pulse, RPE, sign and symptoms, NTG use, and when to call 911 or MD.  Pt voiced understanding. Ruthanne Mcneish, MA, ACSM RCEP  

## 2012-04-09 ENCOUNTER — Encounter (HOSPITAL_COMMUNITY): Payer: 59

## 2012-04-09 ENCOUNTER — Encounter (HOSPITAL_COMMUNITY)
Admission: RE | Admit: 2012-04-09 | Discharge: 2012-04-09 | Disposition: A | Discharge status: Home / Self Care | Payer: 59 | Source: Ambulatory Visit | Attending: Cardiology | Admitting: Cardiology

## 2012-04-09 LAB — GLUCOSE, CAPILLARY: Glucose-Capillary: 175 mg/dL — ABNORMAL HIGH (ref 70–99)

## 2012-04-11 ENCOUNTER — Encounter (HOSPITAL_COMMUNITY): Payer: 59

## 2012-04-11 ENCOUNTER — Encounter (HOSPITAL_COMMUNITY)
Admission: RE | Admit: 2012-04-11 | Discharge: 2012-04-11 | Disposition: A | Discharge status: Home / Self Care | Payer: 59 | Source: Ambulatory Visit | Attending: Cardiology | Admitting: Cardiology

## 2012-04-13 ENCOUNTER — Encounter (HOSPITAL_COMMUNITY): Payer: 59

## 2012-04-13 ENCOUNTER — Encounter (HOSPITAL_COMMUNITY)
Admission: RE | Admit: 2012-04-13 | Discharge: 2012-04-13 | Disposition: A | Discharge status: Home / Self Care | Payer: 59 | Source: Ambulatory Visit | Attending: Cardiology | Admitting: Cardiology

## 2012-04-16 ENCOUNTER — Encounter (HOSPITAL_COMMUNITY): Payer: 59

## 2012-04-16 DIAGNOSIS — I200 Unstable angina: Secondary | ICD-10-CM | POA: Insufficient documentation

## 2012-04-16 DIAGNOSIS — R079 Chest pain, unspecified: Secondary | ICD-10-CM | POA: Insufficient documentation

## 2012-04-16 DIAGNOSIS — Z5189 Encounter for other specified aftercare: Secondary | ICD-10-CM | POA: Insufficient documentation

## 2012-04-18 ENCOUNTER — Encounter (HOSPITAL_COMMUNITY): Payer: 59

## 2012-04-18 ENCOUNTER — Encounter (HOSPITAL_COMMUNITY)
Admission: RE | Admit: 2012-04-18 | Discharge: 2012-04-18 | Disposition: A | Discharge status: Home / Self Care | Payer: 59 | Source: Ambulatory Visit | Attending: Cardiology | Admitting: Cardiology

## 2012-04-18 LAB — GLUCOSE, CAPILLARY
Glucose-Capillary: 86 mg/dL (ref 70–99)
Glucose-Capillary: 152 mg/dL — ABNORMAL HIGH (ref 70–99)

## 2012-04-18 NOTE — Progress Notes (Signed)
[name] 61 y.o. male Nutrition Note Spoke with pt.  Nutrition Plan, Nutrition Survey, and cholesterol goals reviewed with pt. Pt is following Step 1  of the Therapeutic Lifestyle Changes diet. Per discussion with pt, pt has "to hide certain foods from my wife." Pt reports his wife is "very controlling of the food that is in the house." Pt grocery shops with his wife weekly and pt states his wife refuses to buy anything pt puts in the shopping cart. "If she does buy it, I'll go to look for the item the next day and she says she threw it in the trash." Pt states he wants to lose wt and has not done anything to help promote wt loss at this point. Wt loss tips reviewed. Pt encouraged to increase fruit and vegetable consumption. Pt consumed 1 serving of fruit and veggies on his 24 hour survey. Pt states he will eat fruit cups and a limited variety of vegetables. Per pt, pt was told "to eat what was on my plate, which was vegetables, or go hungry."  Pt reports he chose to go hungry many nights. Pt is diabetic. Last A1c indicates blood glucose not well-controlled. Pt states he checks his blood glucose 2-4 times daily. Per discussion with pt, pt only regularly checks his CBG's before bedtime and CBG's "in the 200's." Pt takes his insulin without checking his CBG frequently. Pt states "I feel like I can predict what my blood sugar will be and I'm usually within 50 points." Pt agreed with this writer that + 50 points was not accurate enough. Pt frequently checks CBG's because "I start to feel shaky or light headed." Pt often prefers to finish his task at hand and ignores the need to eat until it's "too late." Pt encouraged by this writer to try and learn to work with his DM dx rather than against it. Per pt, pt's wife is an Charity fundraiser and often states, " why do I keep you on my insurance if you're not going to take care of yourself?" Pt reportedly "feels like a child." This Clinical research associate asked pt if his non-compliance with his DM  and diet may be related to his rebelling against his wife's control over his food. Pt did not agree with the fact that he was rebelling and stated he did have issues with his wife's control over his food. Pt encouraged to talk with his wife re: counseling to help them work through some of their issues that are affecting him. This Clinical research associate went over Diabetes Education test results. Pt expressed understanding of the above information reviewed. Pt aware of nutrition education classes offered. Pt nor pt's wife are  planning on attending nutrition classes. Per pt, "I've been through this (program) before and I think I know all the information." Pt states, "I just don't always apply the information I know to my diet." Pt declined handouts for the Nutrition classes at this time.  Nutrition Diagnosis   Food-and nutrition-related knowledge deficit related to lack of exposure to information as related to diagnosis of: ? CVD ? DM (A1c 8.4)    Overweight related to excessive energy intake as evidenced by a BMI of 26.6  Nutrition RX/ Estimated Daily Nutrition Needs for: wt loss  1550-2050 Kcal, 40-55 gm fat, 10-14 gm sat fat, 1.5-2.0 gm trans-fat, <1500 mg sodium , 175-250 gm CHO   Nutrition Intervention   Pt's individual nutrition plan including cholesterol goals reviewed with pt.   Benefits of adopting Therapeutic Lifestyle  Changes discussed when Medficts reviewed.   Pt to attend the Portion Distortion class   Continue client-centered nutrition education by RD, as part of interdisciplinary care. Goal(s)   Pt to identify and limit food sources of saturated fat, trans fat, and cholesterol   Pt to identify food quantities necessary to achieve: ? wt loss to a goal wt of 166-178 lb (75.5-80.9 kg) at graduation from cardiac rehab.    CBG concentrations in the normal range or as close to normal as is safely possible. Monitor and Evaluate progress toward nutrition goal with team. Nutrition Risk: Change to Moderate

## 2012-04-20 ENCOUNTER — Encounter (HOSPITAL_COMMUNITY): Payer: 59

## 2012-04-20 ENCOUNTER — Encounter (HOSPITAL_COMMUNITY)
Admission: RE | Admit: 2012-04-20 | Discharge: 2012-04-20 | Disposition: A | Discharge status: Home / Self Care | Payer: 59 | Source: Ambulatory Visit | Attending: Cardiology | Admitting: Cardiology

## 2012-04-20 LAB — GLUCOSE, CAPILLARY
Glucose-Capillary: 248 mg/dL — ABNORMAL HIGH (ref 70–99)
Glucose-Capillary: 209 mg/dL — ABNORMAL HIGH (ref 70–99)

## 2012-04-23 ENCOUNTER — Encounter (HOSPITAL_COMMUNITY): Payer: 59

## 2012-04-23 ENCOUNTER — Encounter (HOSPITAL_COMMUNITY)
Admission: RE | Admit: 2012-04-23 | Discharge: 2012-04-23 | Disposition: A | Discharge status: Home / Self Care | Payer: 59 | Source: Ambulatory Visit | Attending: Cardiology | Admitting: Cardiology

## 2012-04-23 LAB — GLUCOSE, CAPILLARY: Glucose-Capillary: 238 mg/dL — ABNORMAL HIGH (ref 70–99)

## 2012-04-25 ENCOUNTER — Encounter (HOSPITAL_COMMUNITY): Payer: 59

## 2012-04-25 ENCOUNTER — Encounter (HOSPITAL_COMMUNITY)
Admission: RE | Admit: 2012-04-25 | Discharge: 2012-04-25 | Disposition: A | Discharge status: Home / Self Care | Payer: 59 | Source: Ambulatory Visit | Attending: Cardiology | Admitting: Cardiology

## 2012-04-27 ENCOUNTER — Encounter (HOSPITAL_COMMUNITY): Payer: 59

## 2012-04-27 ENCOUNTER — Encounter (HOSPITAL_COMMUNITY)
Admission: RE | Admit: 2012-04-27 | Discharge: 2012-04-27 | Disposition: A | Discharge status: Home / Self Care | Payer: 59 | Source: Ambulatory Visit | Attending: Cardiology | Admitting: Cardiology

## 2012-04-27 LAB — GLUCOSE, CAPILLARY: Glucose-Capillary: 147 mg/dL — ABNORMAL HIGH (ref 70–99)

## 2012-04-30 ENCOUNTER — Encounter (HOSPITAL_COMMUNITY): Payer: 59

## 2012-04-30 ENCOUNTER — Encounter (HOSPITAL_COMMUNITY)
Admission: RE | Admit: 2012-04-30 | Discharge: 2012-04-30 | Disposition: A | Discharge status: Home / Self Care | Payer: 59 | Source: Ambulatory Visit | Attending: Cardiology | Admitting: Cardiology

## 2012-05-02 ENCOUNTER — Encounter (HOSPITAL_COMMUNITY): Payer: 59

## 2012-05-02 ENCOUNTER — Encounter (HOSPITAL_COMMUNITY)
Admission: RE | Admit: 2012-05-02 | Discharge: 2012-05-02 | Disposition: A | Discharge status: Home / Self Care | Payer: 59 | Source: Ambulatory Visit | Attending: Cardiology | Admitting: Cardiology

## 2012-05-02 LAB — GLUCOSE, CAPILLARY: Glucose-Capillary: 161 mg/dL — ABNORMAL HIGH (ref 70–99)

## 2012-05-04 ENCOUNTER — Encounter (HOSPITAL_COMMUNITY): Payer: 59

## 2012-05-04 ENCOUNTER — Encounter (HOSPITAL_COMMUNITY)
Admission: RE | Admit: 2012-05-04 | Discharge: 2012-05-04 | Disposition: A | Discharge status: Home / Self Care | Payer: 59 | Source: Ambulatory Visit | Attending: Cardiology | Admitting: Cardiology

## 2012-05-04 LAB — GLUCOSE, CAPILLARY: Glucose-Capillary: 263 mg/dL — ABNORMAL HIGH (ref 70–99)

## 2012-05-07 ENCOUNTER — Encounter (HOSPITAL_COMMUNITY): Payer: 59

## 2012-05-07 ENCOUNTER — Encounter (HOSPITAL_COMMUNITY)
Admission: RE | Admit: 2012-05-07 | Discharge: 2012-05-07 | Disposition: A | Discharge status: Home / Self Care | Payer: 59 | Source: Ambulatory Visit | Attending: Cardiology | Admitting: Cardiology

## 2012-05-07 LAB — GLUCOSE, CAPILLARY: Glucose-Capillary: 229 mg/dL — ABNORMAL HIGH (ref 70–99)

## 2012-05-09 ENCOUNTER — Encounter (HOSPITAL_COMMUNITY): Payer: 59

## 2012-05-09 ENCOUNTER — Encounter (HOSPITAL_COMMUNITY)
Admission: RE | Admit: 2012-05-09 | Discharge: 2012-05-09 | Disposition: A | Discharge status: Home / Self Care | Payer: 59 | Source: Ambulatory Visit | Attending: Cardiology | Admitting: Cardiology

## 2012-05-09 LAB — GLUCOSE, CAPILLARY: Glucose-Capillary: 149 mg/dL — ABNORMAL HIGH (ref 70–99)

## 2012-05-11 ENCOUNTER — Encounter (HOSPITAL_COMMUNITY): Payer: 59

## 2012-05-11 ENCOUNTER — Encounter (HOSPITAL_COMMUNITY)
Admission: RE | Admit: 2012-05-11 | Discharge: 2012-05-11 | Disposition: A | Discharge status: Home / Self Care | Payer: 59 | Source: Ambulatory Visit | Attending: Cardiology | Admitting: Cardiology

## 2012-05-11 LAB — GLUCOSE, CAPILLARY: Glucose-Capillary: 320 mg/dL — ABNORMAL HIGH (ref 70–99)

## 2012-05-11 NOTE — Progress Notes (Signed)
Pt pre exercise blood glucose checked - 306, negative for ketones, pt given H20 to drink.  Questioned pt, reported that he had for breakfast when he got off from work this morning around 6 - biscuit and gravy from [employer].  Marily Memos, dietician asked to speak with patient regarding food choices.  Pt not allowed to exercise per rehab policy regarding blood glucose readings.

## 2012-05-14 ENCOUNTER — Encounter (HOSPITAL_COMMUNITY)
Admission: RE | Admit: 2012-05-14 | Discharge: 2012-05-14 | Disposition: A | Discharge status: Home / Self Care | Payer: 59 | Source: Ambulatory Visit | Attending: Cardiology | Admitting: Cardiology

## 2012-05-14 ENCOUNTER — Encounter (HOSPITAL_COMMUNITY): Payer: 59

## 2012-05-16 ENCOUNTER — Emergency Department (HOSPITAL_COMMUNITY)
Admission: EM | Admit: 2012-05-16 | Discharge: 2012-05-16 | Disposition: A | Discharge status: Home / Self Care | Payer: 59 | Attending: Emergency Medicine | Admitting: Emergency Medicine

## 2012-05-16 ENCOUNTER — Encounter (HOSPITAL_COMMUNITY): Payer: 59

## 2012-05-16 ENCOUNTER — Emergency Department (HOSPITAL_COMMUNITY): Payer: 59

## 2012-05-16 ENCOUNTER — Encounter (HOSPITAL_COMMUNITY): Payer: Self-pay | Admitting: Vascular Surgery

## 2012-05-16 ENCOUNTER — Encounter (HOSPITAL_COMMUNITY)
Admission: RE | Admit: 2012-05-16 | Discharge: 2012-05-16 | Disposition: A | Discharge status: Home / Self Care | Payer: 59 | Source: Ambulatory Visit | Attending: Cardiology | Admitting: Cardiology

## 2012-05-16 ENCOUNTER — Other Ambulatory Visit: Payer: Self-pay

## 2012-05-16 DIAGNOSIS — Z5189 Encounter for other specified aftercare: Secondary | ICD-10-CM | POA: Insufficient documentation

## 2012-05-16 DIAGNOSIS — I200 Unstable angina: Secondary | ICD-10-CM | POA: Insufficient documentation

## 2012-05-16 DIAGNOSIS — E119 Type 2 diabetes mellitus without complications: Secondary | ICD-10-CM | POA: Insufficient documentation

## 2012-05-16 DIAGNOSIS — Z794 Long term (current) use of insulin: Secondary | ICD-10-CM | POA: Insufficient documentation

## 2012-05-16 DIAGNOSIS — R079 Chest pain, unspecified: Secondary | ICD-10-CM | POA: Insufficient documentation

## 2012-05-16 DIAGNOSIS — N189 Chronic kidney disease, unspecified: Secondary | ICD-10-CM | POA: Insufficient documentation

## 2012-05-16 DIAGNOSIS — I2510 Atherosclerotic heart disease of native coronary artery without angina pectoris: Secondary | ICD-10-CM

## 2012-05-16 DIAGNOSIS — I129 Hypertensive chronic kidney disease with stage 1 through stage 4 chronic kidney disease, or unspecified chronic kidney disease: Secondary | ICD-10-CM | POA: Insufficient documentation

## 2012-05-16 LAB — COMPREHENSIVE METABOLIC PANEL
AST: 18 U/L (ref 0–37)
Albumin: 3.6 g/dL (ref 3.5–5.2)
Calcium: 9.9 mg/dL (ref 8.4–10.5)
Creatinine, Ser: 1.21 mg/dL (ref 0.50–1.35)

## 2012-05-16 LAB — PROTIME-INR: INR: 0.93 (ref 0.00–1.49)

## 2012-05-16 LAB — APTT: aPTT: 31 seconds (ref 24–37)

## 2012-05-16 LAB — CBC
MCH: 24.2 pg — ABNORMAL LOW (ref 26.0–34.0)
MCV: 74 fL — ABNORMAL LOW (ref 78.0–100.0)
Platelets: 289 10*3/uL (ref 150–400)
RDW: 15.2 % (ref 11.5–15.5)

## 2012-05-16 LAB — POCT I-STAT TROPONIN I

## 2012-05-16 MED ORDER — SODIUM CHLORIDE 0.9 % IV SOLN
1000.0000 mL | INTRAVENOUS | Status: DC
  Administered 2012-05-16: 1000 mL via INTRAVENOUS

## 2012-05-16 MED ORDER — ISOSORBIDE MONONITRATE ER 30 MG PO TB24: 30.0000 mg | ORAL_TABLET | Freq: Every day | ORAL | Status: DC

## 2012-05-16 MED ORDER — ASPIRIN 81 MG PO CHEW
324.0000 mg | CHEWABLE_TABLET | Freq: Once | ORAL | Status: AC
  Administered 2012-05-16: 324 mg via ORAL
  Filled 2012-05-16: qty 4

## 2012-05-16 NOTE — ED Notes (Signed)
Admit date: 05/16/2012 Referring Physician  Dr. Iantha Fallen Primary Physician  Dr. Darrow Bussing Primary Cardiologist  Dr. Armanda Magic Reason for Consultation  chest discomfort  HPI: 61 year old man who had chest discomfort about 2 months ago.  He underwent cardiac catheterization in July of this year.  He had 2 drug eluting stents placed into a heavily calcified circumflex artery.  He did very well for over a month.  Over the past couple of weeks he has had some burning in his chest.  It occurs with exertion sometimes.  He can stop walking and the pain will go away.  Other times, he continues to walk and the discomfort goes away while he is walking.  He has not used any nitroglycerin for this.  He feels that it may be similar in character to the discomfort he had in the past but is not nearly as intense.  This morning, he was at cardiac rehabilitation and made a comment about some burning in his chest.  He was sent to the emergency room for further evaluation.  His initial troponin was normal.  His ECG was unremarkable.  He states that he feels fine now and does not want to be admitted to the hospital.     PMH:   Past Medical History  Diagnosis Date  . Erectile dysfunction   . Hypertension   . Dyslipidemia   . Chronic kidney disease     kidney stones s/p lithotripsy  . Stroke     TIA's  . Cancer     mediastinal seminoma resected 1985 - benign  . Chest pain   . Shortness of breath   . Diabetes mellitus     neuropathy  insulin dependent  . GERD (gastroesophageal reflux disease)   . NSTEMI (non-ST elevated myocardial infarction)      PSH:   Past Surgical History  Procedure Date  . Resection mediastinal seminonma   . Lithotripsy     Allergies:  Lipitor Prior to Admit Meds:   (Not in a hospital admission) Fam HX:    Family History  Problem Relation Age of Onset  . Diabetes Father   . Diabetes Mother   . Heart failure Mother   . Hypertension Mother   . Cancer Brother   .  Diabetes Sister    Social HX:    History   Social History  . Marital Status: Married    Spouse Name: N/A    Number of Children: N/A  . Years of Education: N/A   Occupational History  . Not on file.   Social History Main Topics  . Smoking status: Former Smoker    Quit date: 03/06/1985  . Smokeless tobacco: Never Used  . Alcohol Use: No  . Drug Use: No  . Sexually Active: Not Currently   Other Topics Concern  . Not on file   Social History Narrative  . No narrative on file     ROS:  All 11 ROS were addressed and are negative except what is stated in the HPI  Physical Exam: Blood pressure 139/74, pulse 91, temperature 98.9 F (37.2 C), temperature source Oral, resp. rate 19, SpO2 99.00%.    General: Well developed, well nourished, in no acute distress Head:   Normal cephalic and atramatic  Lungs:   Clear bilaterally to auscultation and percussion. Heart:   HRRR S1 S2  Abdomen:  abdomen soft and non-tender  Msk:  Back normal,  Normal strength and tone for age. Extremities:   No  edema.  DP +1 Neuro: Alert and oriented X 3. Psych:  Normal affect, responds appropriately    Labs:   Lab Results  Component Value Date   WBC 4.2 05/16/2012   HGB 11.0* 05/16/2012   HCT 33.6* 05/16/2012   MCV 74.0* 05/16/2012   PLT 289 05/16/2012    Lab 05/16/12 0945  NA 137  K 5.0  CL 103  CO2 22  BUN 19  CREATININE 1.21  CALCIUM 9.9  PROT 6.8  BILITOT 0.2*  ALKPHOS 75  ALT 22  AST 18  GLUCOSE 193*   No results found for this basename: PTT   Lab Results  Component Value Date   INR 0.93 05/16/2012   INR 0.99 03/06/2012   Lab Results  Component Value Date   CKTOTAL 198 03/07/2012   CKMB 6.2* 03/07/2012   TROPONINI 0.74* 03/07/2012     No results found for this basename: CHOL   No results found for this basename: HDL   No results found for this basename: LDLCALC   No results found for this basename: TRIG   No results found for this basename: CHOLHDL   No results  found for this basename: LDLDIRECT      Radiology:  Dg Chest Portable 1 View  05/16/2012  *RADIOLOGY REPORT*  Clinical Data: Chest pains.  Shortness of breath.  PORTABLE CHEST - 1 VIEW  Comparison: None.  Findings: The heart size is normal.  The lungs are clear.  The visualized soft tissues and bony thorax are unremarkable.  The patient is status post median sternotomy.  IMPRESSION: No acute cardiopulmonary disease.   Original Report Authenticated By: Jamesetta Orleans. MATTERN, M.D.     EKG:  Normal sinus rhythm, heart rate at upper limit of normal, no ST segment changes  ASSESSMENT: Chest discomfort, calcific coronary artery disease.  PLAN:  At this point, the patient has had 2 negative sets of troponins 3 hours apart.  He is pain free and does not want to be admitted.  He states that he feels as good now as he ever has.  I discussed this with the patient and with the PA in the emergency department.  Will start Imdur 30 mg daily.  He can use sublingual nitroglycerin as well which he has at home.  Continue beta blocker.  Hopefully, this will treat his current symptoms.  If he has further symptoms, he may need further workup with either exercise stress testing or cardiac cath depending on the symptoms.  Continue aspirin and Plavix at this time along with aggressive secondary prevention.  He should followup in our office in the next few days.  Corky Crafts., MD  05/16/2012  2:15 PM

## 2012-05-16 NOTE — Progress Notes (Signed)
Pt c/o epigastric discomfort, chest burning and knee discomfort with exercise today at cardiac rehab.  Pt rates 5/10, describes as anginal equivalent.  Symptoms resolved within 2 minutes with rest.  Pt states these symptoms have been increasing over past 2 weeks.  pc to Dr. Norris Cross office.  Per Dr. Mayford Knife pt will be taken to ED.

## 2012-05-16 NOTE — ED Provider Notes (Signed)
61 y.o. male transferred from pond any sign of given by Dr. Lynelle Doctor: Dalene Seltzer is sent from cardiac rehabilitation this a.m. for burning sensation to epigastrium exertional and relieved by rest. This is similar to the symptoms that he had in July when he had and incidentally at that time. Plan is to draw second troponin and consult with Dr. Latrelle Dodrill who will speak with the patient's cardiologist Dr. Mayford Knife.   Patient is asymptomatic at this time and resting comfortably.   Second troponin is negative I will consult Dr. Latrelle Dodrill advice on  Disposition. Patient remains adamant that he would not like to be admitted at this time.  Results for orders placed during the hospital encounter of 05/16/12  CBC      Component Value Range   WBC 4.2  4.0 - 10.5 K/uL   RBC 4.54  4.22 - 5.81 MIL/uL   Hemoglobin 11.0 (*) 13.0 - 17.0 g/dL   HCT 16.1 (*) 09.6 - 04.5 %   MCV 74.0 (*) 78.0 - 100.0 fL   MCH 24.2 (*) 26.0 - 34.0 pg   MCHC 32.7  30.0 - 36.0 g/dL   RDW 40.9  81.1 - 91.4 %   Platelets 289  150 - 400 K/uL  COMPREHENSIVE METABOLIC PANEL      Component Value Range   Sodium 137  135 - 145 mEq/L   Potassium 5.0  3.5 - 5.1 mEq/L   Chloride 103  96 - 112 mEq/L   CO2 22  19 - 32 mEq/L   Glucose, Bld 193 (*) 70 - 99 mg/dL   BUN 19  6 - 23 mg/dL   Creatinine, Ser 7.82  0.50 - 1.35 mg/dL   Calcium 9.9  8.4 - 95.6 mg/dL   Total Protein 6.8  6.0 - 8.3 g/dL   Albumin 3.6  3.5 - 5.2 g/dL   AST 18  0 - 37 U/L   ALT 22  0 - 53 U/L   Alkaline Phosphatase 75  39 - 117 U/L   Total Bilirubin 0.2 (*) 0.3 - 1.2 mg/dL   GFR calc non Af Amer 62 (*) >90 mL/min   GFR calc Af Amer 72 (*) >90 mL/min  PROTIME-INR      Component Value Range   Prothrombin Time 12.4  11.6 - 15.2 seconds   INR 0.93  0.00 - 1.49  APTT      Component Value Range   aPTT 31  24 - 37 seconds  POCT I-STAT TROPONIN I      Component Value Range   Troponin i, poc 0.00  0.00 - 0.08 ng/mL   Comment 3           POCT I-STAT TROPONIN I   Component Value Range   Troponin i, poc 0.00  0.00 - 0.08 ng/mL   Comment 3            Dg Chest Portable 1 View  05/16/2012  *RADIOLOGY REPORT*  Clinical Data: Chest pains.  Shortness of breath.  PORTABLE CHEST - 1 VIEW  Comparison: None.  Findings: The heart size is normal.  The lungs are clear.  The visualized soft tissues and bony thorax are unremarkable.  The patient is status post median sternotomy.  IMPRESSION: No acute cardiopulmonary disease.   Original Report Authenticated By: Jamesetta Orleans. MATTERN, M.D.     Consult from Dr. Cato Mulligan appreciated: He has advised that patient can be DC'd on Endur 30 mg daily in addition to his regular Rx. He  is to follow with [facility] cards in the next several days.    Pt verbalized understanding and agrees with care plan. Outpatient follow-up and return precautions given.     Wynetta Emery, PA-C 05/16/12 1410

## 2012-05-16 NOTE — ED Provider Notes (Addendum)
History     CSN: [id number]  Arrival date & time 05/16/12  4098   First MD Initiated Contact with Patient 05/16/12 [phone]      Chief Complaint  Patient presents with  . Chest Pain    HPI The patient has history of coronary artery disease with a non-ST elevation MI in July. At that time patient had a cardiac catheterization and had stents placed. The patient states over the last several weeks he has been having episodes of a burning-type discomfort in his chest when he is exerting himself at the same time he also seems to get some discomfort in his knees. The symptoms will resolve when he sits and rests. Patient states he gets a little bit short of breath when this occurs occasionally but otherwise has no trouble with nausea vomiting or lightheadedness. The patient states the chest discomfort is somewhat similar to his prior heart problems. The symptoms have not been getting more severe but have been occurring a little bit more frequently. He had not mentioned this trouble to his cardiologist. He was at cardiac rehabilitation when it occurred again. He mentioned to the nurse there and they suggested he come to the emergency room. Past Medical History  Diagnosis Date  . Erectile dysfunction   . Hypertension   . Dyslipidemia   . Chronic kidney disease     kidney stones s/p lithotripsy  . Stroke     TIA's  . Cancer     mediastinal seminoma resected 1985 - benign  . Chest pain   . Shortness of breath   . Diabetes mellitus     neuropathy  insulin dependent  . GERD (gastroesophageal reflux disease)   . NSTEMI (non-ST elevated myocardial infarction)     Past Surgical History  Procedure Date  . Resection mediastinal seminonma   . Lithotripsy     Family History  Problem Relation Age of Onset  . Diabetes Father   . Diabetes Mother   . Heart failure Mother   . Hypertension Mother   . Cancer Brother   . Diabetes Sister     History  Substance Use Topics  . Smoking status: Former  Smoker    Quit date: 03/06/1985  . Smokeless tobacco: Never Used  . Alcohol Use: No      Review of Systems  All other systems reviewed and are negative.    Allergies  Lipitor  Home Medications   Current Outpatient Rx  Name Route Sig Dispense Refill  . AMITRIPTYLINE HCL 100 MG PO TABS Oral Take 100 mg by mouth at bedtime.    . ASPIRIN 325 MG PO TBEC Oral Take 325 mg by mouth daily.    Marland Kitchen CLOPIDOGREL BISULFATE 75 MG PO TABS Oral Take 75 mg by mouth daily.    . INSULIN ASPART 100 UNIT/ML [place] SOLN Subcutaneous Inject 8-10 Units into the skin 3 (three) times daily before meals. Patient's dose is done on a sliding scale. Usually takes 8 units, but dose varies depending on glucose level.    . INSULIN DETEMIR 100 UNIT/ML [place] SOLN Subcutaneous Inject 40 Units into the skin at bedtime.    Marland Kitchen POLYSACCHARIDE IRON COMPLEX 150 MG PO CAPS Oral Take 150 mg by mouth daily.    Marland Kitchen LISINOPRIL 2.5 MG PO TABS Oral Take 2.5 mg by mouth daily.    Marland Kitchen METFORMIN HCL 500 MG PO TABS Oral Take 2 tablets (1,000 mg total) by mouth 2 (two) times daily with a meal. Do not restart  Metformin until 7/26    . METOPROLOL SUCCINATE ER 25 MG PO TB24 Oral Take 50 mg by mouth daily.    Marland Kitchen ONE-DAILY MULTI VITAMINS PO TABS Oral Take 1 tablet by mouth daily.    Marland Kitchen NITROGLYCERIN 0.4 MG SL SUBL Sublingual Place 1 tablet (0.4 mg total) under the tongue every 5 (five) minutes x 3 doses as needed for chest pain. 30 tablet 5  . FISH OIL MAXIMUM STRENGTH 1200 MG PO CAPS Oral Take 3,600 mg by mouth 2 (two) times daily.    Marland Kitchen OMEPRAZOLE 20 MG PO CPDR Oral Take 20 mg by mouth daily as needed. For heart burn    . SIMVASTATIN 40 MG PO TABS Oral Take 40 mg by mouth every evening.    Marland Kitchen ZOLPIDEM TARTRATE 10 MG PO TABS Oral Take 10 mg by mouth at bedtime as needed. For sleep      BP 131/76  Pulse 104  Temp 97.8 F (36.6 C) (Oral)  Resp 22  SpO2 100%  Physical Exam  Nursing note and vitals reviewed. Constitutional: He appears well-developed  and well-nourished. No distress.  HENT:  Head: Normocephalic and atraumatic.  Right Ear: External ear normal.  Left Ear: External ear normal.  Eyes: Conjunctivae normal are normal. Right eye exhibits no discharge. Left eye exhibits no discharge. No scleral icterus.  Neck: Neck supple. No tracheal deviation present.  Cardiovascular: Normal rate, regular rhythm and intact distal pulses.   Pulmonary/Chest: Effort normal and breath sounds normal. No stridor. No respiratory distress. He has no wheezes. He has no rales.  Abdominal: Soft. Bowel sounds are normal. He exhibits no distension. There is no tenderness. There is no rebound and no guarding.  Musculoskeletal: He exhibits no edema and no tenderness.  Neurological: He is alert. He has normal strength. No sensory deficit. Cranial nerve deficit:  no gross defecits noted. He exhibits normal muscle tone. He displays no seizure activity. Coordination normal.  Skin: Skin is warm and dry. No rash noted.  Psychiatric: He has a normal mood and affect.    ED Course  Procedures (including critical care time)  Rate: 102  Rhythm: Sinus tachycardia  QRS Axis: normal  Intervals: normal  ST/T Wave abnormalities: normal  Conduction Disutrbances:none  Old EKG Reviewed: No significant change compared to last tracing Labs Reviewed  CBC - Abnormal; Notable for the following:    Hemoglobin 11.0 (*)     HCT 33.6 (*)     MCV 74.0 (*)     MCH 24.2 (*)     All other components within normal limits  COMPREHENSIVE METABOLIC PANEL - Abnormal; Notable for the following:    Glucose, Bld 193 (*)     Total Bilirubin 0.2 (*)     GFR calc non Af Amer 62 (*)     GFR calc Af Amer 72 (*)     All other components within normal limits  POCT I-STAT TROPONIN I  PROTIME-INR  APTT   Dg Chest Portable 1 View  05/16/2012  *RADIOLOGY REPORT*  Clinical Data: Chest pains.  Shortness of breath.  PORTABLE CHEST - 1 VIEW  Comparison: None.  Findings: The heart size is normal.   The lungs are clear.  The visualized soft tissues and bony thorax are unremarkable.  The patient is status post median sternotomy.  IMPRESSION: No acute cardiopulmonary disease.   Original Report Authenticated By: Jamesetta Orleans. MATTERN, M.D.       MDM  Patient has known history of coronary artery  disease.  He presents with exercise induced chest discomfort that has been ongoing for the last several weeks.  Patient had not called his cardiologist when he had another episode today at cardiac rehabilitation he is instructed to come to the hospital. The symptoms are somewhat atypical but he states these symptoms are similar to chest pain he had previously associated with this coronary artery disease.  I will consult with his cardiologist. At this time he remains asymptomatic.        Celene Kras, MD 05/16/12 1108  Pt was seen by Dr Eldridge Dace.  He does not want to be admitted to the hospital.  Will check another troponin.  Dr Seth Bake will discuss pt's case with his primary cardiologist and likely plan for close outpatient follow up if the second troponin is normal.  Celene Kras, MD 05/16/12 1158

## 2012-05-16 NOTE — ED Notes (Signed)
Results of troponin I per POCT standards:   0.00

## 2012-05-16 NOTE — ED Notes (Signed)
Pt was doing cardiac rehab today and while he was on the treadmill he experiencing some chest burning and bilateral knee pain. Denies SOB, N/V, lightheadedness and any other associated symptoms other than the knee pain. Has hx of NSTEMI in the past and says that the symptoms feel the same. Also has significant medical hx. Has had these episodes for the past few weeks and is associated with activity. Rest resolves the symptoms. Denies CP at this time.

## 2012-05-16 NOTE — ED Notes (Signed)
Cardiology at the bedside.

## 2012-05-17 NOTE — ED Provider Notes (Signed)
Medical screening examination/treatment/procedure(s) were conducted as a shared visit with non-physician practitioner(s) and myself.  I personally evaluated the patient during the encounter   Celene Kras, MD 05/17/12 [phone]

## 2012-05-18 ENCOUNTER — Encounter (HOSPITAL_COMMUNITY)
Admission: RE | Admit: 2012-05-18 | Discharge: 2012-05-18 | Disposition: A | Discharge status: Home / Self Care | Payer: 59 | Source: Ambulatory Visit | Attending: Cardiology | Admitting: Cardiology

## 2012-05-18 ENCOUNTER — Encounter (HOSPITAL_COMMUNITY): Payer: 59

## 2012-05-18 LAB — GLUCOSE, CAPILLARY: Glucose-Capillary: 149 mg/dL — ABNORMAL HIGH (ref 70–99)

## 2012-05-21 ENCOUNTER — Encounter (HOSPITAL_COMMUNITY)
Admission: RE | Admit: 2012-05-21 | Discharge: 2012-05-21 | Disposition: A | Discharge status: Home / Self Care | Payer: 59 | Source: Ambulatory Visit | Attending: Cardiology | Admitting: Cardiology

## 2012-05-21 ENCOUNTER — Encounter (HOSPITAL_COMMUNITY): Payer: 59

## 2012-05-21 NOTE — Progress Notes (Signed)
Pt arrived at cardiac rehab CBG-385.  Pt reports when he first checked CBG at 6:00am is it was "high 200".  Pt ate frosted flakes for breakfast and took his usual insulin dose at 6:00am.  No exercise today.  Urine negative for ketones.  Reviewed with Mickle Plumb, RD, Diabetes Educator.  Per Marily Memos, pt advised to use his sliding scale insulin to cover 385.  Pt verbalized understanding

## 2012-05-23 ENCOUNTER — Encounter (HOSPITAL_COMMUNITY)
Admission: RE | Admit: 2012-05-23 | Discharge: 2012-05-23 | Disposition: A | Discharge status: Home / Self Care | Payer: 59 | Source: Ambulatory Visit | Attending: Cardiology | Admitting: Cardiology

## 2012-05-23 ENCOUNTER — Encounter (HOSPITAL_COMMUNITY): Payer: 59

## 2012-05-25 ENCOUNTER — Encounter (HOSPITAL_COMMUNITY): Payer: 59

## 2012-05-25 ENCOUNTER — Encounter (HOSPITAL_COMMUNITY)
Admission: RE | Admit: 2012-05-25 | Discharge: 2012-05-25 | Disposition: A | Discharge status: Home / Self Care | Payer: 59 | Source: Ambulatory Visit | Attending: Cardiology | Admitting: Cardiology

## 2012-05-25 LAB — GLUCOSE, CAPILLARY
Glucose-Capillary: 106 mg/dL — ABNORMAL HIGH (ref 70–99)
Glucose-Capillary: 147 mg/dL — ABNORMAL HIGH (ref 70–99)
Glucose-Capillary: 89 mg/dL (ref 70–99)

## 2012-05-25 NOTE — Progress Notes (Signed)
Pt arrived at cardiac rehab CBG-89.  Pt states he did not eat breakfast however took his usual dose of insulin at 700am, CBG-187 at that time.  Last meal at 300am.  Pt states he did not eat because he did not want to elevate CBG above acceptable range for exercise.  Pt given peanut butter crackers and lemonade.  CBG-106 15 minutes later.  Pt educated on importance of eating within 30 minutes of taking insulin always.  Understanding verbalized

## 2012-05-28 ENCOUNTER — Encounter (HOSPITAL_COMMUNITY)
Admission: RE | Admit: 2012-05-28 | Discharge: 2012-05-28 | Disposition: A | Discharge status: Home / Self Care | Payer: 59 | Source: Ambulatory Visit | Attending: Cardiology | Admitting: Cardiology

## 2012-05-28 ENCOUNTER — Encounter (HOSPITAL_COMMUNITY): Payer: 59

## 2012-05-28 NOTE — Progress Notes (Signed)
Pt arrived at cardiac rehab, CBG-298.  Pt states he ate cheerios for breakfast.  However he cut his insulin dose in 1/2 (took 4 units instead of 8) to make sure CBG remained above 100 so he could exercise, his fasting CBG-172. Pt advised to avoid cutting insulin dosage.  Understanding verbalized.

## 2012-05-30 ENCOUNTER — Encounter (HOSPITAL_COMMUNITY)
Admission: RE | Admit: 2012-05-30 | Discharge: 2012-05-30 | Disposition: A | Discharge status: Home / Self Care | Payer: 59 | Source: Ambulatory Visit | Attending: Cardiology | Admitting: Cardiology

## 2012-05-30 ENCOUNTER — Encounter (HOSPITAL_COMMUNITY): Payer: 59

## 2012-05-30 LAB — GLUCOSE, CAPILLARY: Glucose-Capillary: 150 mg/dL — ABNORMAL HIGH (ref 70–99)

## 2012-06-01 ENCOUNTER — Encounter (HOSPITAL_COMMUNITY): Payer: 59

## 2012-06-01 ENCOUNTER — Encounter (HOSPITAL_COMMUNITY)
Admission: RE | Admit: 2012-06-01 | Discharge: 2012-06-01 | Disposition: A | Discharge status: Home / Self Care | Payer: 59 | Source: Ambulatory Visit | Attending: Cardiology | Admitting: Cardiology

## 2012-06-04 ENCOUNTER — Encounter (HOSPITAL_COMMUNITY): Payer: 59

## 2012-06-04 ENCOUNTER — Encounter (HOSPITAL_COMMUNITY)
Admission: RE | Admit: 2012-06-04 | Discharge: 2012-06-04 | Disposition: A | Discharge status: Home / Self Care | Payer: 59 | Source: Ambulatory Visit | Attending: Cardiology | Admitting: Cardiology

## 2012-06-06 ENCOUNTER — Encounter (HOSPITAL_COMMUNITY): Payer: 59

## 2012-06-06 ENCOUNTER — Encounter (HOSPITAL_COMMUNITY)
Admission: RE | Admit: 2012-06-06 | Discharge: 2012-06-06 | Disposition: A | Discharge status: Home / Self Care | Payer: 59 | Source: Ambulatory Visit | Attending: Cardiology | Admitting: Cardiology

## 2012-06-06 LAB — GLUCOSE, CAPILLARY: Glucose-Capillary: 87 mg/dL (ref 70–99)

## 2012-06-06 NOTE — Progress Notes (Signed)
Pt's post exercise blood glucose during cool down - 87.  Pt with pre-blood glucose 146.  Pt given tang to drink.  Upon further questioning pt reported that he ate only cookies for breakfast prior to exercise.  Educated pt extensively regarding proper food choice for diabetics.  Pt reports that he knows what he should eat, " I just don't like to, food is the only thing I have to indulge or treat myself."  Talked to Pt extensively regarding the consequences of poor diabetes management.  Pt verbalized understanding and promised to do better.  Encourage pt to find alternatives for pleasure activities that do not negatively  impact his health.  Pt plans to "think" about it. Plan to follow up with pt on Friday when he returns to exercise.

## 2012-06-08 ENCOUNTER — Encounter (HOSPITAL_COMMUNITY)
Admission: RE | Admit: 2012-06-08 | Discharge: 2012-06-08 | Disposition: A | Discharge status: Home / Self Care | Payer: 59 | Source: Ambulatory Visit | Attending: Cardiology | Admitting: Cardiology

## 2012-06-08 ENCOUNTER — Encounter (HOSPITAL_COMMUNITY): Payer: 59

## 2012-06-11 ENCOUNTER — Encounter (HOSPITAL_COMMUNITY): Payer: 59

## 2012-06-13 ENCOUNTER — Encounter (HOSPITAL_COMMUNITY): Payer: 59

## 2012-06-15 ENCOUNTER — Encounter (HOSPITAL_COMMUNITY): Payer: 59

## 2012-06-18 ENCOUNTER — Encounter (HOSPITAL_COMMUNITY): Payer: 59

## 2012-06-18 ENCOUNTER — Encounter (HOSPITAL_COMMUNITY)
Admission: RE | Admit: 2012-06-18 | Discharge: 2012-06-18 | Disposition: A | Discharge status: Home / Self Care | Payer: 59 | Source: Ambulatory Visit | Attending: Cardiology | Admitting: Cardiology

## 2012-06-18 DIAGNOSIS — R079 Chest pain, unspecified: Secondary | ICD-10-CM | POA: Insufficient documentation

## 2012-06-18 DIAGNOSIS — Z5189 Encounter for other specified aftercare: Secondary | ICD-10-CM | POA: Insufficient documentation

## 2012-06-18 DIAGNOSIS — I200 Unstable angina: Secondary | ICD-10-CM | POA: Insufficient documentation

## 2012-06-18 LAB — GLUCOSE, CAPILLARY: Glucose-Capillary: 217 mg/dL — ABNORMAL HIGH (ref 70–99)

## 2012-06-20 ENCOUNTER — Encounter (HOSPITAL_COMMUNITY)
Admission: RE | Admit: 2012-06-20 | Discharge: 2012-06-20 | Disposition: A | Discharge status: Home / Self Care | Payer: 59 | Source: Ambulatory Visit | Attending: Cardiology | Admitting: Cardiology

## 2012-06-20 ENCOUNTER — Encounter (HOSPITAL_COMMUNITY): Payer: 59

## 2012-06-22 ENCOUNTER — Encounter (HOSPITAL_COMMUNITY): Payer: 59

## 2012-06-22 ENCOUNTER — Encounter (HOSPITAL_COMMUNITY)
Admission: RE | Admit: 2012-06-22 | Discharge: 2012-06-22 | Disposition: A | Discharge status: Home / Self Care | Payer: 59 | Source: Ambulatory Visit | Attending: Cardiology | Admitting: Cardiology

## 2012-06-22 LAB — GLUCOSE, CAPILLARY: Glucose-Capillary: 200 mg/dL — ABNORMAL HIGH (ref 70–99)

## 2012-06-25 ENCOUNTER — Encounter (HOSPITAL_COMMUNITY): Payer: 59

## 2012-06-25 ENCOUNTER — Encounter (HOSPITAL_COMMUNITY)
Admission: RE | Admit: 2012-06-25 | Discharge: 2012-06-25 | Disposition: A | Discharge status: Home / Self Care | Payer: 59 | Source: Ambulatory Visit | Attending: Cardiology | Admitting: Cardiology

## 2012-06-25 LAB — GLUCOSE, CAPILLARY: Glucose-Capillary: 278 mg/dL — ABNORMAL HIGH (ref 70–99)

## 2012-06-27 ENCOUNTER — Encounter (HOSPITAL_COMMUNITY): Payer: 59

## 2012-06-27 ENCOUNTER — Encounter (HOSPITAL_COMMUNITY)
Admission: RE | Admit: 2012-06-27 | Discharge: 2012-06-27 | Disposition: A | Discharge status: Home / Self Care | Payer: 59 | Source: Ambulatory Visit | Attending: Cardiology | Admitting: Cardiology

## 2012-06-27 LAB — GLUCOSE, CAPILLARY: Glucose-Capillary: 121 mg/dL — ABNORMAL HIGH (ref 70–99)

## 2012-06-29 ENCOUNTER — Encounter (HOSPITAL_COMMUNITY): Payer: 59

## 2012-06-29 ENCOUNTER — Encounter (HOSPITAL_COMMUNITY)
Admission: RE | Admit: 2012-06-29 | Discharge: 2012-06-29 | Disposition: A | Discharge status: Home / Self Care | Payer: 59 | Source: Ambulatory Visit | Attending: Cardiology | Admitting: Cardiology

## 2012-06-29 LAB — GLUCOSE, CAPILLARY: Glucose-Capillary: 210 mg/dL — ABNORMAL HIGH (ref 70–99)

## 2012-07-02 ENCOUNTER — Encounter (HOSPITAL_COMMUNITY)
Admission: RE | Admit: 2012-07-02 | Discharge: 2012-07-02 | Disposition: A | Discharge status: Home / Self Care | Payer: 59 | Source: Ambulatory Visit | Attending: Cardiology | Admitting: Cardiology

## 2012-07-04 ENCOUNTER — Encounter (HOSPITAL_COMMUNITY)
Admission: RE | Admit: 2012-07-04 | Discharge: 2012-07-04 | Disposition: A | Discharge status: Home / Self Care | Payer: 59 | Source: Ambulatory Visit | Attending: Cardiology | Admitting: Cardiology

## 2012-07-04 ENCOUNTER — Encounter (HOSPITAL_COMMUNITY): Payer: Self-pay

## 2012-07-04 LAB — GLUCOSE, CAPILLARY: Glucose-Capillary: 211 mg/dL — ABNORMAL HIGH (ref 70–99)

## 2012-07-06 ENCOUNTER — Encounter (HOSPITAL_COMMUNITY): Payer: 59

## 2012-07-09 ENCOUNTER — Encounter (HOSPITAL_COMMUNITY): Payer: 59

## 2012-07-11 ENCOUNTER — Encounter (HOSPITAL_COMMUNITY): Payer: 59

## 2012-07-16 ENCOUNTER — Encounter (HOSPITAL_COMMUNITY): Payer: 59

## 2012-07-17 ENCOUNTER — Encounter (HOSPITAL_COMMUNITY): Payer: 59

## 2012-07-18 ENCOUNTER — Encounter (HOSPITAL_COMMUNITY): Payer: 59

## 2012-07-19 ENCOUNTER — Encounter (HOSPITAL_COMMUNITY): Admission: RE | Admit: 2012-07-19 | Payer: 59 | Source: Ambulatory Visit

## 2012-07-20 ENCOUNTER — Ambulatory Visit (HOSPITAL_COMMUNITY)
Admission: RE | Admit: 2012-07-20 | Discharge: 2012-07-20 | Disposition: A | Discharge status: Home / Self Care | Payer: 59 | Source: Ambulatory Visit | Attending: Gastroenterology | Admitting: Gastroenterology

## 2012-07-20 ENCOUNTER — Encounter (HOSPITAL_COMMUNITY): Payer: 59

## 2012-07-20 ENCOUNTER — Encounter (HOSPITAL_COMMUNITY): Payer: Self-pay | Admitting: Gastroenterology

## 2012-07-20 ENCOUNTER — Encounter (HOSPITAL_COMMUNITY)
Admission: RE | Disposition: A | Discharge status: Home / Self Care | Payer: Self-pay | Source: Ambulatory Visit | Attending: Gastroenterology

## 2012-07-20 DIAGNOSIS — E1149 Type 2 diabetes mellitus with other diabetic neurological complication: Secondary | ICD-10-CM | POA: Insufficient documentation

## 2012-07-20 DIAGNOSIS — I10 Essential (primary) hypertension: Secondary | ICD-10-CM | POA: Insufficient documentation

## 2012-07-20 DIAGNOSIS — Z5189 Encounter for other specified aftercare: Secondary | ICD-10-CM | POA: Insufficient documentation

## 2012-07-20 DIAGNOSIS — I200 Unstable angina: Secondary | ICD-10-CM | POA: Insufficient documentation

## 2012-07-20 DIAGNOSIS — I252 Old myocardial infarction: Secondary | ICD-10-CM | POA: Insufficient documentation

## 2012-07-20 DIAGNOSIS — Z794 Long term (current) use of insulin: Secondary | ICD-10-CM | POA: Insufficient documentation

## 2012-07-20 DIAGNOSIS — K219 Gastro-esophageal reflux disease without esophagitis: Secondary | ICD-10-CM | POA: Insufficient documentation

## 2012-07-20 DIAGNOSIS — D649 Anemia, unspecified: Secondary | ICD-10-CM | POA: Insufficient documentation

## 2012-07-20 DIAGNOSIS — R195 Other fecal abnormalities: Secondary | ICD-10-CM | POA: Insufficient documentation

## 2012-07-20 DIAGNOSIS — E1142 Type 2 diabetes mellitus with diabetic polyneuropathy: Secondary | ICD-10-CM | POA: Insufficient documentation

## 2012-07-20 DIAGNOSIS — R079 Chest pain, unspecified: Secondary | ICD-10-CM | POA: Insufficient documentation

## 2012-07-20 HISTORY — PX: ESOPHAGOGASTRODUODENOSCOPY: SHX5428

## 2012-07-20 SURGERY — EGD (ESOPHAGOGASTRODUODENOSCOPY)
Anesthesia: Moderate Sedation

## 2012-07-20 MED ORDER — MIDAZOLAM HCL 5 MG/ML IJ SOLN
INTRAMUSCULAR | Status: AC
  Filled 2012-07-20: qty 2

## 2012-07-20 MED ORDER — BUTAMBEN-TETRACAINE-BENZOCAINE 2-2-14 % EX AERO
INHALATION_SPRAY | CUTANEOUS | Status: DC | PRN
  Administered 2012-07-20: 2 via TOPICAL

## 2012-07-20 MED ORDER — SODIUM CHLORIDE 0.9 % IV SOLN: INTRAVENOUS | Status: DC

## 2012-07-20 MED ORDER — FENTANYL CITRATE 0.05 MG/ML IJ SOLN
INTRAMUSCULAR | Status: AC
  Filled 2012-07-20: qty 2

## 2012-07-20 MED ORDER — MIDAZOLAM HCL 10 MG/2ML IJ SOLN
INTRAMUSCULAR | Status: DC | PRN
  Administered 2012-07-20: 1 mg via INTRAVENOUS
  Administered 2012-07-20 (×2): 2 mg via INTRAVENOUS

## 2012-07-20 MED ORDER — FENTANYL CITRATE 0.05 MG/ML IJ SOLN
INTRAMUSCULAR | Status: DC | PRN
  Administered 2012-07-20 (×2): 25 ug via INTRAVENOUS

## 2012-07-20 NOTE — Op Note (Signed)
[facility] [address] [place], [address]   ENDOSCOPY PROCEDURE REPORT  PATIENT: [name], [name]  MR#: [id number] BIRTHDATE: 01-24-1951 , 61  yrs. old GENDER: Male ENDOSCOPIST:Emberlyn Burlison Randa Evens, MD REFERRED BY: Darrow Bussing, M.D. PROCEDURE DATE:  07/20/2012 PROCEDURE:   EGD, diagnostic ASA CLASS:    Class III INDICATIONS: anemia positive stools. MEDICATION: Fentanyl 50 mcg IV, Versed 6 mg IV, and Cetacaine spray x 2 spray TOPICAL ANESTHETIC:  DESCRIPTION OF PROCEDURE:   After the risks and benefits of the procedure were explained, informed consent was obtained.  The Pentax Gastroscope [id number]  endoscope was introduced through the mouth  and advanced to the    .  The instrument was slowly withdrawn as the mucosa was fully examined. No active or recent bleeding seen.      DUODENUM: The duodenal mucosa showed no abnormalities in the entire duodenum.  STOMACH: The mucosa of the stomach appeared normal.  ESOPHAGUS: The mucosa of the esophagus appeared normal.          The scope was then withdrawn from the patient and the procedure completed.  COMPLICATIONS: There were no complications.   ENDOSCOPIC IMPRESSION: No active bleeding seen 1.   The duodenal mucosa showed no abnormalities in the entire duodenum 2.   The mucosa of the stomach appeared normal 3.   The mucosa of the esophagus appeared normal  RECOMMENDATIONS: To Dr Randa Evens office monday for CBC and to set up colonoscopy next week.    _______________________________ eSigned:  Carman Ching, MD 07/20/2012 9:51 AM   standard discharge   PATIENT NAME:  [name], [name] MR#: [id number]

## 2012-07-20 NOTE — H&P (Signed)
Subjective:   Patient is a 61 y.o. male presents with drop in Hg and positive stools. Procedure including risks and benefits discussed in office.  There are no active problems to display for this patient.  Past Medical History  Diagnosis Date  . Erectile dysfunction   . Hypertension   . Dyslipidemia   . Chronic kidney disease     kidney stones s/p lithotripsy  . Stroke     TIA's  . Cancer     mediastinal seminoma resected 1985 - benign  . Chest pain   . Shortness of breath   . Diabetes mellitus     neuropathy  insulin dependent  . GERD (gastroesophageal reflux disease)   . NSTEMI (non-ST elevated myocardial infarction)     Past Surgical History  Procedure Date  . Resection mediastinal seminonma   . Lithotripsy     Prescriptions prior to admission  Medication Sig Dispense Refill  . amitriptyline (ELAVIL) 100 MG tablet Take 100 mg by mouth at bedtime.      Marland Kitchen aspirin 325 MG EC tablet Take 81 mg by mouth daily.       . clonazePAM (KLONOPIN) 0.5 MG tablet Take 0.5 mg by mouth at bedtime as needed.      . clopidogrel (PLAVIX) 75 MG tablet Take 75 mg by mouth daily.      . insulin aspart (NOVOLOG) 100 UNIT/ML injection Inject 6-14 Units into the skin 3 (three) times daily before meals. Patient's dose is done on a sliding scale. Usually takes 8 units, but dose varies depending on glucose level.      . insulin detemir (LEVEMIR) 100 UNIT/ML injection Inject 40 Units into the skin at bedtime.      . iron polysaccharides (NIFEREX) 150 MG capsule Take 150 mg by mouth daily.      . isosorbide mononitrate (IMDUR) 30 MG 24 hr tablet Take 60 mg by mouth daily.      Marland Kitchen lisinopril (PRINIVIL,ZESTRIL) 2.5 MG tablet Take 2.5 mg by mouth daily.      . metFORMIN (GLUCOPHAGE) 500 MG tablet Take 2 tablets (1,000 mg total) by mouth 2 (two) times daily with a meal. Do not restart Metformin until 7/26      . metoprolol succinate (TOPROL-XL) 50 MG 24 hr tablet Take 50 mg by mouth daily. Take with or  immediately following a meal.      . Multiple Vitamin (MULTIVITAMIN) tablet Take 1 tablet by mouth daily.      . Omega-3 Fatty Acids (FISH OIL MAXIMUM STRENGTH) 1200 MG CAPS Take 3,600 mg by mouth 2 (two) times daily.      . simvastatin (ZOCOR) 40 MG tablet Take 40 mg by mouth every evening.      . zolpidem (AMBIEN) 10 MG tablet Take 10 mg by mouth at bedtime as needed. For sleep      . nitroGLYCERIN (NITROSTAT) 0.4 MG SL tablet Place 1 tablet (0.4 mg total) under the tongue every 5 (five) minutes x 3 doses as needed for chest pain.  30 tablet  5  . omeprazole (PRILOSEC) 20 MG capsule Take 20 mg by mouth daily as needed. For heart burn       Allergies  Allergen Reactions  . Lipitor (Atorvastatin) Other (See Comments)    myalgias    History  Substance Use Topics  . Smoking status: Former Smoker    Quit date: 03/06/1985  . Smokeless tobacco: Never Used  . Alcohol Use: No    Family History  Problem Relation Age of Onset  . Diabetes Father   . Diabetes Mother   . Heart failure Mother   . Hypertension Mother   . Cancer Brother   . Diabetes Sister      Objective:   Patient Vitals for the past 8 hrs:  BP Temp Temp src Pulse Resp SpO2 Height Weight  07/20/12 0825 114/65 mmHg 98.2 F (36.8 C) Oral 90  14  100 % 5\' 11"  (1.803 m) 87.091 kg (192 lb)         See MD Preop evaluation      Assessment:   1. GI bleed  Plan:   EGD control of bleeding if necessary. Discussed in office.

## 2012-07-23 ENCOUNTER — Encounter (HOSPITAL_COMMUNITY): Payer: Self-pay | Admitting: Gastroenterology

## 2012-07-23 ENCOUNTER — Encounter (HOSPITAL_COMMUNITY): Payer: 59

## 2012-07-24 ENCOUNTER — Encounter (HOSPITAL_COMMUNITY): Payer: 59

## 2012-07-25 ENCOUNTER — Encounter (HOSPITAL_COMMUNITY)
Admission: RE | Disposition: A | Discharge status: Home / Self Care | Payer: Self-pay | Source: Ambulatory Visit | Attending: Gastroenterology

## 2012-07-25 ENCOUNTER — Other Ambulatory Visit (HOSPITAL_COMMUNITY): Payer: Self-pay | Admitting: *Deleted

## 2012-07-25 ENCOUNTER — Encounter (HOSPITAL_COMMUNITY): Payer: Self-pay | Admitting: *Deleted

## 2012-07-25 ENCOUNTER — Ambulatory Visit (HOSPITAL_COMMUNITY)
Admission: RE | Admit: 2012-07-25 | Discharge: 2012-07-25 | Disposition: A | Discharge status: Home / Self Care | Payer: 59 | Source: Ambulatory Visit | Attending: Gastroenterology | Admitting: Gastroenterology

## 2012-07-25 ENCOUNTER — Encounter (HOSPITAL_COMMUNITY): Payer: 59

## 2012-07-25 DIAGNOSIS — Z794 Long term (current) use of insulin: Secondary | ICD-10-CM | POA: Insufficient documentation

## 2012-07-25 DIAGNOSIS — R195 Other fecal abnormalities: Secondary | ICD-10-CM | POA: Insufficient documentation

## 2012-07-25 DIAGNOSIS — E1142 Type 2 diabetes mellitus with diabetic polyneuropathy: Secondary | ICD-10-CM | POA: Insufficient documentation

## 2012-07-25 DIAGNOSIS — D649 Anemia, unspecified: Secondary | ICD-10-CM | POA: Insufficient documentation

## 2012-07-25 DIAGNOSIS — I10 Essential (primary) hypertension: Secondary | ICD-10-CM | POA: Insufficient documentation

## 2012-07-25 DIAGNOSIS — E1149 Type 2 diabetes mellitus with other diabetic neurological complication: Secondary | ICD-10-CM | POA: Insufficient documentation

## 2012-07-25 HISTORY — PX: COLONOSCOPY: SHX5424

## 2012-07-25 SURGERY — COLONOSCOPY
Anesthesia: Moderate Sedation

## 2012-07-25 MED ORDER — DIPHENHYDRAMINE HCL 50 MG/ML IJ SOLN
INTRAMUSCULAR | Status: AC
  Filled 2012-07-25: qty 1

## 2012-07-25 MED ORDER — FENTANYL CITRATE 0.05 MG/ML IJ SOLN
INTRAMUSCULAR | Status: DC | PRN
  Administered 2012-07-25 (×2): 25 ug via INTRAVENOUS

## 2012-07-25 MED ORDER — MIDAZOLAM HCL 5 MG/ML IJ SOLN
INTRAMUSCULAR | Status: AC
  Filled 2012-07-25: qty 3

## 2012-07-25 MED ORDER — SODIUM CHLORIDE 0.9 % IV SOLN
INTRAVENOUS | Status: DC
  Administered 2012-07-25: 500 mL via INTRAVENOUS

## 2012-07-25 MED ORDER — MIDAZOLAM HCL 5 MG/5ML IJ SOLN
INTRAMUSCULAR | Status: DC | PRN
  Administered 2012-07-25 (×3): 2 mg via INTRAVENOUS

## 2012-07-25 MED ORDER — FENTANYL CITRATE 0.05 MG/ML IJ SOLN
INTRAMUSCULAR | Status: AC
  Filled 2012-07-25: qty 4

## 2012-07-25 NOTE — H&P (Signed)
Subjective:   Patient is a 61 y.o. male presents with need for colonoscopy for evaluation of heme positive stools. Had EGD recently that was negative.. Procedure including risks and benefits discussed in office.  There are no active problems to display for this patient.  Past Medical History  Diagnosis Date  . Erectile dysfunction   . Hypertension   . Dyslipidemia   . Chronic kidney disease     kidney stones s/p lithotripsy  . Stroke     TIA's  . Cancer     mediastinal seminoma resected 1985 - benign  . Chest pain   . Shortness of breath   . Diabetes mellitus     neuropathy  insulin dependent  . GERD (gastroesophageal reflux disease)   . NSTEMI (non-ST elevated myocardial infarction)     Past Surgical History  Procedure Date  . Resection mediastinal seminonma   . Lithotripsy   . Esophagogastroduodenoscopy 07/20/2012    Procedure: ESOPHAGOGASTRODUODENOSCOPY (EGD);  Surgeon: Vertell Novak., MD;  Location: [facility] ENDOSCOPY;  Service: Endoscopy;  Laterality: N/A;    Prescriptions prior to admission  Medication Sig Dispense Refill  . amitriptyline (ELAVIL) 100 MG tablet Take 100 mg by mouth at bedtime.      Marland Kitchen aspirin 325 MG EC tablet Take 81 mg by mouth daily.       . clonazePAM (KLONOPIN) 0.5 MG tablet Take 0.5 mg by mouth at bedtime as needed.      . clopidogrel (PLAVIX) 75 MG tablet Take 75 mg by mouth daily.      . insulin aspart (NOVOLOG) 100 UNIT/ML injection Inject 6-14 Units into the skin 3 (three) times daily before meals. Patient's dose is done on a sliding scale. Usually takes 8 units, but dose varies depending on glucose level.      . insulin glargine (LANTUS) 100 UNIT/ML injection Inject 40 Units into the skin at bedtime.      . iron polysaccharides (NIFEREX) 150 MG capsule Take 150 mg by mouth daily.      . isosorbide mononitrate (IMDUR) 30 MG 24 hr tablet Take 60 mg by mouth daily.      Marland Kitchen lisinopril (PRINIVIL,ZESTRIL) 2.5 MG tablet Take 2.5 mg by mouth daily.       . metFORMIN (GLUCOPHAGE) 500 MG tablet Take 2 tablets (1,000 mg total) by mouth 2 (two) times daily with a meal. Do not restart Metformin until 7/26      . metoprolol succinate (TOPROL-XL) 50 MG 24 hr tablet Take 50 mg by mouth daily. Take with or immediately following a meal.      . Multiple Vitamin (MULTIVITAMIN) tablet Take 1 tablet by mouth daily.      . Omega-3 Fatty Acids (FISH OIL MAXIMUM STRENGTH) 1200 MG CAPS Take 3,600 mg by mouth 2 (two) times daily.      . simvastatin (ZOCOR) 40 MG tablet Take 40 mg by mouth every evening.      . zolpidem (AMBIEN) 10 MG tablet Take 10 mg by mouth at bedtime as needed. For sleep      . insulin detemir (LEVEMIR) 100 UNIT/ML injection Inject 40 Units into the skin at bedtime.      . nitroGLYCERIN (NITROSTAT) 0.4 MG SL tablet Place 1 tablet (0.4 mg total) under the tongue every 5 (five) minutes x 3 doses as needed for chest pain.  30 tablet  5  . omeprazole (PRILOSEC) 20 MG capsule Take 20 mg by mouth daily as needed. For heart burn  Allergies  Allergen Reactions  . Lipitor (Atorvastatin) Other (See Comments)    myalgias    History  Substance Use Topics  . Smoking status: Former Smoker    Quit date: 03/06/1985  . Smokeless tobacco: Never Used  . Alcohol Use: No    Family History  Problem Relation Age of Onset  . Diabetes Father   . Diabetes Mother   . Heart failure Mother   . Hypertension Mother   . Cancer Brother   . Diabetes Sister      Objective:   Patient Vitals for the past 8 hrs:  BP Temp Temp src Resp SpO2  07/25/12 0856 119/76 mmHg - - 15  100 %  07/25/12 0834 120/71 mmHg 98.1 F (36.7 C) Oral 22  98 %         See MD Preop evaluation      Assessment:   1. Heme positive stools with negative EGD  Plan:   We'll proceed with colonoscopy today risk and benefits of the procedure have been discussed with the patient.

## 2012-07-25 NOTE — Op Note (Signed)
[facility] [address] [place], [address]   COLONOSCOPY PROCEDURE REPORT  PATIENT: Bill, [name]  MR#: [id number] BIRTHDATE: 12-09-1950 , 61  yrs. old GENDER: Male ENDOSCOPIST: Carman Ching, MD REFERRED BY:   Dr Peterson Ao PROCEDURE DATE:  07/25/2012 PROCEDURE:     Colonoscopy ASA CLASS:   class 2 INDICATIONS:  positive stools MEDICATIONS:   fentanyl 50 mcg Versed 6 mg IV  DESCRIPTION OF PROCEDURE: Pentax pediatric colonoscope was inserted. The patient had been prepped with a routine prep there was a large amount of sticky liquid stool with small pieces of hard stool throughout the colon. The cecum in particular was completely filled up with stool it was not suctionable. We used abdominal pressure and place the patient right lateral decubitus position to try to move the liquid. There were no gross lesions seen. A lot of mucosa was seen fairly well and a small polyp or even tumor or AVM could clearly have been missed, however there was no large cancer seen. The scope was withdrawn and the colon examined carefully with the patient in the left than the right lateral decubitus position with no gross lesions other than as mentioned.     COMPLICATIONS: None  ENDOSCOPIC IMPRESSION: Patient tolerated the procedure well. 1. Positive stool. Colonoscopy grossly normal other than very poor prep small lesion or AVM could have been missed.  RECOMMENDATIONS: we'll resume the patient's medications in have him come into the office next week for stool Hemoccults and another CBC.    _______________________________ Rosalie DoctorCarman Ching, MD 07/25/2012 10:09 AM

## 2012-07-26 ENCOUNTER — Encounter (HOSPITAL_COMMUNITY)
Admission: RE | Admit: 2012-07-26 | Discharge: 2012-07-26 | Disposition: A | Discharge status: Home / Self Care | Payer: 59 | Source: Ambulatory Visit | Attending: Gastroenterology | Admitting: Gastroenterology

## 2012-07-26 ENCOUNTER — Encounter (HOSPITAL_COMMUNITY): Payer: 59

## 2012-07-26 ENCOUNTER — Encounter (HOSPITAL_COMMUNITY): Payer: Self-pay | Admitting: Gastroenterology

## 2012-07-26 DIAGNOSIS — Z5189 Encounter for other specified aftercare: Secondary | ICD-10-CM | POA: Insufficient documentation

## 2012-07-26 DIAGNOSIS — R079 Chest pain, unspecified: Secondary | ICD-10-CM | POA: Insufficient documentation

## 2012-07-26 DIAGNOSIS — I200 Unstable angina: Secondary | ICD-10-CM | POA: Insufficient documentation

## 2012-07-26 MED ORDER — SODIUM CHLORIDE 0.9 % IV SOLN
1000.0000 mg | Freq: Once | INTRAVENOUS | Status: AC
  Administered 2012-07-26: 1000 mg via INTRAVENOUS
  Filled 2012-07-26: qty 20

## 2012-07-26 MED ORDER — IRON DEXTRAN 50 MG/ML IJ SOLN
25.0000 mg | Freq: Once | INTRAMUSCULAR | Status: AC
  Administered 2012-07-26: 25 mg via INTRAVENOUS
  Filled 2012-07-26: qty 0.5

## 2012-07-27 ENCOUNTER — Encounter (HOSPITAL_COMMUNITY): Payer: 59

## 2012-07-31 ENCOUNTER — Encounter (HOSPITAL_COMMUNITY)
Admission: RE | Admit: 2012-07-31 | Discharge: 2012-07-31 | Disposition: A | Discharge status: Home / Self Care | Payer: 59 | Source: Ambulatory Visit | Attending: Cardiology | Admitting: Cardiology

## 2012-08-01 ENCOUNTER — Encounter (HOSPITAL_COMMUNITY): Payer: 59

## 2012-08-02 ENCOUNTER — Encounter (HOSPITAL_COMMUNITY): Payer: 59

## 2012-08-03 ENCOUNTER — Encounter (HOSPITAL_COMMUNITY)
Admission: RE | Admit: 2012-08-03 | Discharge: 2012-08-03 | Disposition: A | Discharge status: Home / Self Care | Payer: 59 | Source: Ambulatory Visit | Attending: Cardiology | Admitting: Cardiology

## 2012-08-07 ENCOUNTER — Encounter (HOSPITAL_COMMUNITY): Payer: 59

## 2012-08-09 ENCOUNTER — Encounter (HOSPITAL_COMMUNITY): Payer: 59

## 2012-08-10 ENCOUNTER — Encounter (HOSPITAL_COMMUNITY): Payer: 59

## 2012-08-14 ENCOUNTER — Encounter (HOSPITAL_COMMUNITY)
Admission: RE | Admit: 2012-08-14 | Discharge: 2012-08-14 | Disposition: A | Discharge status: Home / Self Care | Payer: 59 | Source: Ambulatory Visit | Attending: Cardiology | Admitting: Cardiology

## 2012-08-16 ENCOUNTER — Encounter (HOSPITAL_COMMUNITY)
Admission: RE | Admit: 2012-08-16 | Discharge: 2012-08-16 | Disposition: A | Discharge status: Home / Self Care | Payer: Self-pay | Source: Ambulatory Visit | Attending: Cardiology | Admitting: Cardiology

## 2012-08-16 DIAGNOSIS — Z5189 Encounter for other specified aftercare: Secondary | ICD-10-CM | POA: Insufficient documentation

## 2012-08-16 DIAGNOSIS — R079 Chest pain, unspecified: Secondary | ICD-10-CM | POA: Insufficient documentation

## 2012-08-16 DIAGNOSIS — I200 Unstable angina: Secondary | ICD-10-CM | POA: Insufficient documentation

## 2012-08-17 ENCOUNTER — Encounter (HOSPITAL_COMMUNITY)
Admission: RE | Admit: 2012-08-17 | Discharge: 2012-08-17 | Disposition: A | Discharge status: Home / Self Care | Payer: Self-pay | Source: Ambulatory Visit | Attending: Cardiology | Admitting: Cardiology

## 2012-08-21 ENCOUNTER — Encounter (HOSPITAL_COMMUNITY)
Admission: RE | Admit: 2012-08-21 | Discharge: 2012-08-21 | Disposition: A | Discharge status: Home / Self Care | Payer: Self-pay | Source: Ambulatory Visit | Attending: Cardiology | Admitting: Cardiology

## 2012-08-23 ENCOUNTER — Encounter (HOSPITAL_COMMUNITY)
Admission: RE | Admit: 2012-08-23 | Discharge: 2012-08-23 | Disposition: A | Discharge status: Home / Self Care | Payer: Self-pay | Source: Ambulatory Visit | Attending: Cardiology | Admitting: Cardiology

## 2012-08-24 ENCOUNTER — Encounter (HOSPITAL_COMMUNITY)
Admission: RE | Admit: 2012-08-24 | Discharge: 2012-08-24 | Disposition: A | Discharge status: Home / Self Care | Payer: Self-pay | Source: Ambulatory Visit | Attending: Cardiology | Admitting: Cardiology

## 2012-08-28 ENCOUNTER — Encounter (HOSPITAL_COMMUNITY)
Admission: RE | Admit: 2012-08-28 | Discharge: 2012-08-28 | Disposition: A | Discharge status: Home / Self Care | Payer: Self-pay | Source: Ambulatory Visit | Attending: Cardiology | Admitting: Cardiology

## 2012-08-30 ENCOUNTER — Encounter (HOSPITAL_COMMUNITY)
Admission: RE | Admit: 2012-08-30 | Discharge: 2012-08-30 | Disposition: A | Discharge status: Home / Self Care | Payer: Self-pay | Source: Ambulatory Visit | Attending: Cardiology | Admitting: Cardiology

## 2012-08-31 ENCOUNTER — Encounter (HOSPITAL_COMMUNITY)
Admission: RE | Admit: 2012-08-31 | Discharge: 2012-08-31 | Disposition: A | Discharge status: Home / Self Care | Payer: Self-pay | Source: Ambulatory Visit | Attending: Cardiology | Admitting: Cardiology

## 2012-09-04 ENCOUNTER — Encounter (HOSPITAL_COMMUNITY)
Admission: RE | Admit: 2012-09-04 | Discharge: 2012-09-04 | Disposition: A | Discharge status: Home / Self Care | Payer: Self-pay | Source: Ambulatory Visit | Attending: Cardiology | Admitting: Cardiology

## 2012-09-06 ENCOUNTER — Encounter (HOSPITAL_COMMUNITY)
Admission: RE | Admit: 2012-09-06 | Discharge: 2012-09-06 | Disposition: A | Discharge status: Home / Self Care | Payer: Self-pay | Source: Ambulatory Visit | Attending: Cardiology | Admitting: Cardiology

## 2012-09-07 ENCOUNTER — Encounter (HOSPITAL_COMMUNITY)
Admission: RE | Admit: 2012-09-07 | Discharge: 2012-09-07 | Disposition: A | Discharge status: Home / Self Care | Payer: Self-pay | Source: Ambulatory Visit | Attending: Cardiology | Admitting: Cardiology

## 2012-09-11 ENCOUNTER — Encounter (HOSPITAL_COMMUNITY)
Admission: RE | Admit: 2012-09-11 | Discharge: 2012-09-11 | Disposition: A | Discharge status: Home / Self Care | Payer: Self-pay | Source: Ambulatory Visit | Attending: Cardiology | Admitting: Cardiology

## 2012-09-13 ENCOUNTER — Encounter (HOSPITAL_COMMUNITY)
Admission: RE | Admit: 2012-09-13 | Discharge: 2012-09-13 | Disposition: A | Discharge status: Home / Self Care | Payer: Self-pay | Source: Ambulatory Visit | Attending: Cardiology | Admitting: Cardiology

## 2012-09-14 ENCOUNTER — Encounter (HOSPITAL_COMMUNITY)
Admission: RE | Admit: 2012-09-14 | Discharge: 2012-09-14 | Disposition: A | Discharge status: Home / Self Care | Payer: Self-pay | Source: Ambulatory Visit | Attending: Cardiology | Admitting: Cardiology

## 2012-09-18 ENCOUNTER — Encounter (HOSPITAL_COMMUNITY)
Admission: RE | Admit: 2012-09-18 | Discharge: 2012-09-18 | Disposition: A | Discharge status: Home / Self Care | Payer: Self-pay | Source: Ambulatory Visit | Attending: Cardiology | Admitting: Cardiology

## 2012-09-18 DIAGNOSIS — Z5189 Encounter for other specified aftercare: Secondary | ICD-10-CM | POA: Insufficient documentation

## 2012-09-18 DIAGNOSIS — R079 Chest pain, unspecified: Secondary | ICD-10-CM | POA: Insufficient documentation

## 2012-09-18 DIAGNOSIS — I200 Unstable angina: Secondary | ICD-10-CM | POA: Insufficient documentation

## 2012-09-20 ENCOUNTER — Encounter (HOSPITAL_COMMUNITY)
Admission: RE | Admit: 2012-09-20 | Discharge: 2012-09-20 | Disposition: A | Discharge status: Home / Self Care | Payer: Self-pay | Source: Ambulatory Visit | Attending: Cardiology | Admitting: Cardiology

## 2012-09-21 ENCOUNTER — Encounter (HOSPITAL_COMMUNITY)
Admission: RE | Admit: 2012-09-21 | Discharge: 2012-09-21 | Disposition: A | Discharge status: Home / Self Care | Payer: Self-pay | Source: Ambulatory Visit | Attending: Cardiology | Admitting: Cardiology

## 2012-09-25 ENCOUNTER — Encounter (HOSPITAL_COMMUNITY)
Admission: RE | Admit: 2012-09-25 | Discharge: 2012-09-25 | Disposition: A | Discharge status: Home / Self Care | Payer: Self-pay | Source: Ambulatory Visit | Attending: Cardiology | Admitting: Cardiology

## 2012-09-27 ENCOUNTER — Encounter (HOSPITAL_COMMUNITY): Payer: Self-pay

## 2012-09-28 ENCOUNTER — Encounter (HOSPITAL_COMMUNITY): Payer: Self-pay

## 2012-10-02 ENCOUNTER — Encounter (HOSPITAL_COMMUNITY): Payer: Self-pay

## 2012-10-04 ENCOUNTER — Encounter (HOSPITAL_COMMUNITY)
Admission: RE | Admit: 2012-10-04 | Discharge: 2012-10-04 | Disposition: A | Discharge status: Home / Self Care | Payer: Self-pay | Source: Ambulatory Visit | Attending: Cardiology | Admitting: Cardiology

## 2012-10-05 ENCOUNTER — Encounter (HOSPITAL_COMMUNITY)
Admission: RE | Admit: 2012-10-05 | Discharge: 2012-10-05 | Disposition: A | Discharge status: Home / Self Care | Payer: Self-pay | Source: Ambulatory Visit | Attending: Cardiology | Admitting: Cardiology

## 2012-10-09 ENCOUNTER — Encounter (HOSPITAL_COMMUNITY)
Admission: RE | Admit: 2012-10-09 | Discharge: 2012-10-09 | Disposition: A | Discharge status: Home / Self Care | Payer: Self-pay | Source: Ambulatory Visit | Attending: Cardiology | Admitting: Cardiology

## 2012-10-11 ENCOUNTER — Encounter (HOSPITAL_COMMUNITY)
Admission: RE | Admit: 2012-10-11 | Discharge: 2012-10-11 | Disposition: A | Discharge status: Home / Self Care | Payer: Self-pay | Source: Ambulatory Visit | Attending: Cardiology | Admitting: Cardiology

## 2012-10-12 ENCOUNTER — Encounter (HOSPITAL_COMMUNITY)
Admission: RE | Admit: 2012-10-12 | Discharge: 2012-10-12 | Disposition: A | Discharge status: Home / Self Care | Payer: Self-pay | Source: Ambulatory Visit | Attending: Cardiology | Admitting: Cardiology

## 2012-10-16 ENCOUNTER — Encounter (HOSPITAL_COMMUNITY): Payer: Self-pay

## 2012-10-16 DIAGNOSIS — Z5189 Encounter for other specified aftercare: Secondary | ICD-10-CM | POA: Insufficient documentation

## 2012-10-16 DIAGNOSIS — R079 Chest pain, unspecified: Secondary | ICD-10-CM | POA: Insufficient documentation

## 2012-10-16 DIAGNOSIS — I200 Unstable angina: Secondary | ICD-10-CM | POA: Insufficient documentation

## 2012-10-18 ENCOUNTER — Encounter (HOSPITAL_COMMUNITY): Payer: Self-pay

## 2012-10-19 ENCOUNTER — Encounter (HOSPITAL_COMMUNITY): Payer: Self-pay

## 2012-10-23 ENCOUNTER — Encounter (HOSPITAL_COMMUNITY)
Admission: RE | Admit: 2012-10-23 | Discharge: 2012-10-23 | Disposition: A | Discharge status: Home / Self Care | Payer: Self-pay | Source: Ambulatory Visit | Attending: Cardiology | Admitting: Cardiology

## 2012-10-25 ENCOUNTER — Encounter (HOSPITAL_COMMUNITY)
Admission: RE | Admit: 2012-10-25 | Discharge: 2012-10-25 | Disposition: A | Discharge status: Home / Self Care | Payer: Self-pay | Source: Ambulatory Visit | Attending: Cardiology | Admitting: Cardiology

## 2012-10-26 ENCOUNTER — Encounter (HOSPITAL_COMMUNITY)
Admission: RE | Admit: 2012-10-26 | Discharge: 2012-10-26 | Disposition: A | Discharge status: Home / Self Care | Payer: Self-pay | Source: Ambulatory Visit | Attending: Cardiology | Admitting: Cardiology

## 2012-10-30 ENCOUNTER — Encounter (HOSPITAL_COMMUNITY): Payer: Self-pay

## 2012-11-01 ENCOUNTER — Encounter (HOSPITAL_COMMUNITY)
Admission: RE | Admit: 2012-11-01 | Discharge: 2012-11-01 | Disposition: A | Discharge status: Home / Self Care | Payer: Self-pay | Source: Ambulatory Visit | Attending: Cardiology | Admitting: Cardiology

## 2012-11-02 ENCOUNTER — Encounter (HOSPITAL_COMMUNITY)
Admission: RE | Admit: 2012-11-02 | Discharge: 2012-11-02 | Disposition: A | Discharge status: Home / Self Care | Payer: Self-pay | Source: Ambulatory Visit | Attending: Cardiology | Admitting: Cardiology

## 2012-11-06 ENCOUNTER — Encounter (HOSPITAL_COMMUNITY)
Admission: RE | Admit: 2012-11-06 | Discharge: 2012-11-06 | Disposition: A | Discharge status: Home / Self Care | Payer: Self-pay | Source: Ambulatory Visit | Attending: Cardiology | Admitting: Cardiology

## 2012-11-08 ENCOUNTER — Encounter (HOSPITAL_COMMUNITY)
Admission: RE | Admit: 2012-11-08 | Discharge: 2012-11-08 | Disposition: A | Discharge status: Home / Self Care | Payer: Self-pay | Source: Ambulatory Visit | Attending: Cardiology | Admitting: Cardiology

## 2012-11-09 ENCOUNTER — Encounter (HOSPITAL_COMMUNITY)
Admission: RE | Admit: 2012-11-09 | Discharge: 2012-11-09 | Disposition: A | Discharge status: Home / Self Care | Payer: Self-pay | Source: Ambulatory Visit | Attending: Cardiology | Admitting: Cardiology

## 2012-11-13 ENCOUNTER — Encounter (HOSPITAL_COMMUNITY)
Admission: RE | Admit: 2012-11-13 | Discharge: 2012-11-13 | Disposition: A | Discharge status: Home / Self Care | Payer: Self-pay | Source: Ambulatory Visit | Attending: Cardiology | Admitting: Cardiology

## 2012-11-13 DIAGNOSIS — Z5189 Encounter for other specified aftercare: Secondary | ICD-10-CM | POA: Insufficient documentation

## 2012-11-13 DIAGNOSIS — I200 Unstable angina: Secondary | ICD-10-CM | POA: Insufficient documentation

## 2012-11-13 DIAGNOSIS — R079 Chest pain, unspecified: Secondary | ICD-10-CM | POA: Insufficient documentation

## 2012-11-15 ENCOUNTER — Encounter (HOSPITAL_COMMUNITY)
Admission: RE | Admit: 2012-11-15 | Discharge: 2012-11-15 | Disposition: A | Discharge status: Home / Self Care | Payer: Self-pay | Source: Ambulatory Visit | Attending: Cardiology | Admitting: Cardiology

## 2012-11-16 ENCOUNTER — Encounter (HOSPITAL_COMMUNITY)
Admission: RE | Admit: 2012-11-16 | Discharge: 2012-11-16 | Disposition: A | Discharge status: Home / Self Care | Payer: Self-pay | Source: Ambulatory Visit | Attending: Cardiology | Admitting: Cardiology

## 2012-11-20 ENCOUNTER — Encounter (HOSPITAL_COMMUNITY)
Admission: RE | Admit: 2012-11-20 | Discharge: 2012-11-20 | Disposition: A | Discharge status: Home / Self Care | Payer: Self-pay | Source: Ambulatory Visit | Attending: Cardiology | Admitting: Cardiology

## 2012-11-22 ENCOUNTER — Encounter (HOSPITAL_COMMUNITY)
Admission: RE | Admit: 2012-11-22 | Discharge: 2012-11-22 | Disposition: A | Discharge status: Home / Self Care | Payer: Self-pay | Source: Ambulatory Visit | Attending: Cardiology | Admitting: Cardiology

## 2012-11-23 ENCOUNTER — Encounter (HOSPITAL_COMMUNITY)
Admission: RE | Admit: 2012-11-23 | Discharge: 2012-11-23 | Disposition: A | Discharge status: Home / Self Care | Payer: Self-pay | Source: Ambulatory Visit | Attending: Cardiology | Admitting: Cardiology

## 2012-11-27 ENCOUNTER — Encounter (HOSPITAL_COMMUNITY): Payer: Self-pay

## 2012-11-29 ENCOUNTER — Encounter (HOSPITAL_COMMUNITY)
Admission: RE | Admit: 2012-11-29 | Discharge: 2012-11-29 | Disposition: A | Discharge status: Home / Self Care | Payer: Self-pay | Source: Ambulatory Visit | Attending: Cardiology | Admitting: Cardiology

## 2012-11-30 ENCOUNTER — Encounter (HOSPITAL_COMMUNITY)
Admission: RE | Admit: 2012-11-30 | Discharge: 2012-11-30 | Disposition: A | Discharge status: Home / Self Care | Payer: Self-pay | Source: Ambulatory Visit | Attending: Cardiology | Admitting: Cardiology

## 2012-12-04 ENCOUNTER — Encounter (HOSPITAL_COMMUNITY)
Admission: RE | Admit: 2012-12-04 | Discharge: 2012-12-04 | Disposition: A | Discharge status: Home / Self Care | Payer: Self-pay | Source: Ambulatory Visit | Attending: Cardiology | Admitting: Cardiology

## 2012-12-06 ENCOUNTER — Encounter (HOSPITAL_COMMUNITY)
Admission: RE | Admit: 2012-12-06 | Discharge: 2012-12-06 | Disposition: A | Discharge status: Home / Self Care | Payer: Self-pay | Source: Ambulatory Visit | Attending: Cardiology | Admitting: Cardiology

## 2012-12-07 ENCOUNTER — Encounter (HOSPITAL_COMMUNITY)
Admission: RE | Admit: 2012-12-07 | Discharge: 2012-12-07 | Disposition: A | Discharge status: Home / Self Care | Payer: Self-pay | Source: Ambulatory Visit | Attending: Cardiology | Admitting: Cardiology

## 2012-12-11 ENCOUNTER — Encounter (HOSPITAL_COMMUNITY)
Admission: RE | Admit: 2012-12-11 | Discharge: 2012-12-11 | Disposition: A | Discharge status: Home / Self Care | Payer: Self-pay | Source: Ambulatory Visit | Attending: Cardiology | Admitting: Cardiology

## 2012-12-11 NOTE — Progress Notes (Signed)
Today [name] was sitting in a chair instead of exercising at beginning of first station. Inquired how he was feeling. He said he was experiencing indigestion due to eating sausage before attending exercise today. He also shared this with [name] RN who reports additionally that patient also had coffee along with a sausage biscuit.  The patient was able to exercise most of session with occasional rest breaks. He insisted it was the sausage causing the indigestion.  He said eating sausage is unusual for him, especially prior to attending exercise. He completed exercise session with [facility] and was free of indigestion when he left.

## 2012-12-13 ENCOUNTER — Encounter (HOSPITAL_COMMUNITY)
Admission: RE | Admit: 2012-12-13 | Discharge: 2012-12-13 | Disposition: A | Discharge status: Home / Self Care | Payer: Self-pay | Source: Ambulatory Visit | Attending: Cardiology | Admitting: Cardiology

## 2012-12-13 DIAGNOSIS — I200 Unstable angina: Secondary | ICD-10-CM | POA: Insufficient documentation

## 2012-12-13 DIAGNOSIS — Z5189 Encounter for other specified aftercare: Secondary | ICD-10-CM | POA: Insufficient documentation

## 2012-12-13 DIAGNOSIS — R079 Chest pain, unspecified: Secondary | ICD-10-CM | POA: Insufficient documentation

## 2012-12-14 ENCOUNTER — Encounter (HOSPITAL_COMMUNITY)
Admission: RE | Admit: 2012-12-14 | Discharge: 2012-12-14 | Disposition: A | Discharge status: Home / Self Care | Payer: Self-pay | Source: Ambulatory Visit | Attending: Cardiology | Admitting: Cardiology

## 2012-12-18 ENCOUNTER — Encounter (HOSPITAL_COMMUNITY): Payer: Self-pay

## 2012-12-20 ENCOUNTER — Encounter (HOSPITAL_COMMUNITY)
Admission: RE | Admit: 2012-12-20 | Discharge: 2012-12-20 | Disposition: A | Discharge status: Home / Self Care | Payer: Self-pay | Source: Ambulatory Visit | Attending: Cardiology | Admitting: Cardiology

## 2012-12-21 ENCOUNTER — Encounter (HOSPITAL_COMMUNITY)
Admission: RE | Admit: 2012-12-21 | Discharge: 2012-12-21 | Disposition: A | Discharge status: Home / Self Care | Payer: Self-pay | Source: Ambulatory Visit | Attending: Cardiology | Admitting: Cardiology

## 2012-12-21 NOTE — Progress Notes (Signed)
[name] shared today that he is becoming more short of breath with exertional activities and not able to exercise at the intensity he had without shortness of breath.  He said he will walk a very short distance at a brisk pace and gets noticeably short of breath.  He reports that he saw Dr. Katrinka Blazing last week but he did not share his experience of dyspnea.  [name] commented he is confused as to why he is experiencing shortness of breath.  He reports his Metoprolol succinate dose was increased to 100 mg a day. He was instructed to call the office and report symptoms to Dr. Mayford Knife.  I also did a follow up phone message today to Dr. Norris Cross nurse regarding his shortness of breath.

## 2012-12-25 ENCOUNTER — Encounter (HOSPITAL_COMMUNITY)
Admission: RE | Admit: 2012-12-25 | Discharge: 2012-12-25 | Disposition: A | Discharge status: Home / Self Care | Payer: Self-pay | Source: Ambulatory Visit | Attending: Cardiology | Admitting: Cardiology

## 2012-12-27 ENCOUNTER — Encounter: Payer: Self-pay | Admitting: Cardiology

## 2012-12-27 ENCOUNTER — Encounter (HOSPITAL_COMMUNITY): Admission: RE | Admit: 2012-12-27 | Payer: Self-pay | Source: Ambulatory Visit

## 2012-12-27 ENCOUNTER — Other Ambulatory Visit: Payer: Self-pay | Admitting: Cardiology

## 2012-12-27 NOTE — H&P (Signed)
Office Visit     Patient: [name] Account Number: [id number] Provider: Matisse Roskelley, MD  DOB: 07/01/1951 Age: 61 Y Sex: Male Date: 12/26/2012  Phone: [phone]   Address: [address], [place], [place]  Pcp: DIBAS [name]          1. TT/sob.        HPI:  General:  The patient presents today for follouwp of his SOB, CAD and HTN. He recently saw [name] for an episode of chest pain and since then has had an episode of severe SOB while on the treadmill at cardiac rehab. He has a history of iron deficiency anemia and was seen by [name] this am. His last Hgb was 10 and he does not think his SOB is due to anemia. He has continued to have chest pain which only occurs with exertion. He is very active and has gone from jogging on the treadmill to now he cannot even walk on the treadmill due to chest pain and SOB..        ROS:  See HPI, A twelve system review was perfomed at today's visit. For pertinent positives and negatives see HPI.       Medical History: AAA negative 11/11 CT abdomen- no aneurysm aortic, diabetes mellitus with neuropathy, ([name]) sees Bill. Balan, Allergies, /insomnia, Erectile dysfunction, Hypertension, Elevated cholesterol, Kidney stones-lithotripsy plan april/2012,[name], Colon due 6-16, history of TIAs, EF 55-60percent, MILD AI,MR,TR 02/2012, CAD with 60% stenosis of mid RCA, 80% prox left circ, 99% distal left cir, normal LAD, normal LVF s/p PCI of left circ/OM, 02/2012, normal EGD 12/13, Colon- 12/13- colonoscopy grossly normal- poor prep, stool cards positive, [name] Anemia resolved on IV iron.        Surgical History: Mediastinal Seminoma removed (benign) 1985, colonoscopy- 02/11/10, 10/12/07, 10/18/06 , EGD- 08/05/10 , kidney stone 08/2010.        Family History: Father: deceased 76 yrs, diabetesMother: deceased 69 yrs, diabetes, hypertension, CHFBrother 1: deceased 54 yrs, Cancer PancreaticBrother2: alive 57 yrs, A + WSister 1: alive 58  yrs, A + WSister 2: deceased 58 yrs, diabetes       Social History:  General:  History of smoking  cigarettes: Former smoker Quit in year 1986 no Smoking.  no Alcohol.  Caffeine: yes, coffee, 1 serving daily.  no Recreational drug use.  no Diet.  Exercise: yes, walks, eliptical, 4x a week.  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  Marital Status: married, twice. This time for 15 years. "It is what it is.".  Children: 2, son (s).  Seat belt use: yes.  Originally from [place], [place].       Medications: Taking Nu-Iron 150 MG Capsule 2 capsules once a day, Taking Ambien 10 MG Tablet 1 tablet at bedtime 30mintues before bedtime, qhs prn insomnia, Taking Amitriptyline HCl 100 Milligram Tablet TAKE 1 TABLET BY MOUTH EVERY NIGHT AT BEDTIME at bedtime, Taking Isosorbide Mononitrate CR 60 MG Tablet Extended Release 24 Hour 1 tablet Once a day, Taking Levemir 100 UNIT/ML Solution 40units at bedtime, Taking Multi For Him Tablet 1 tablet once a day, Taking metformin 500 mg tab 2 tablets twice a day, Taking NovoLog 100 UNIT/ML Solution sliding scale , Taking Plavix 75 MG Tablet 1 tablet Once a day, Taking Simvastatin 40 MG Tablet 1 tablet every evening Once a day, Taking Losartan Potassium 25 MG Tablet 1 tablet Once a day, Taking Metoprolol Succinate 100 MG Tablet Extended Release 24 Hour 1 tablet Once a day,   Taking Nitroglycerin 0.4 mg 0.4 mg tablet 1 tablet as directed , Medication List reviewed and reconciled with the patient       Allergies: Lipitor: cramps: Side Effects.           Vitals: Wt 187.4, Wt change .4 lb, Ht 69.75, BMI 27.08, Pulse sitting 84, BP sitting 102/82.       Examination:  Cardiology, General:  GENERAL APPEARANCE: pleasant, NAD.  HEENT: unremarkable.  CAROTID UPSTROKE: normal, no bruit.  JVD: flat.  HEART SOUNDS: regular, normal S1, S2, no S3 or S4.  MURMUR: absent.  LUNGS: no rales or wheezes.  ABDOMEN: soft, non tender, positive bowel sounds, no  masses felt.  EXTREMITIES: no leg edema.  PERIPHERAL PULSES: 2 plus bilateral.            Assessment:  1. Exertional angina - 413.9 (Primary)  2. CAD (coronary artery disease), native coronary artery - 414.01  3. Hypertension - 401.9        1. Exertional angina  LAB: PT (Prothrombin Time) (005199) LAB: Basic Metabolic LAB: CBC with Diff Notes: He continues to exertional chest pain and DOE but no CP at rest. I have recommended that we proceed with cardiac cath to evaluate coronary anatomy. , Risks and benefits of cardiac catheterization have been reviewed including risk of stroke, heart attack, death, bleeding, renal impariment and arterial damage. There was ample oppurtuny to answer questions. Alternatives were discussed. Patient understands and wishes to proceed.       2. CAD (coronary artery disease), native coronary artery  Continue Plavix Tablet, 75 MG, 1 tablet, Orally, Once a day ; Continue Isosorbide Mononitrate CR Tablet Extended Release 24 Hour, 60 MG, 1 tablet, Orally, Once a day .       3. Hypertension  Continue Metoprolol Succinate Tablet Extended Release 24 Hour, 100 MG, 1 tablet, Orally, Once a day ; Continue Losartan Potassium Tablet, 25 MG, 1 tablet, Orally, Once a day .        Procedures:  Venipuncture:  Venipuncture: [name] 12/26/2012 11:56:58 AM > , performed in right arm.           Labs:    Lab: PT (Prothrombin Time) (005199)  Prothrombin Time 10.5  9.1-12.0 - SEC  INR 1.0  0.8-1.2 -           Lab: Basic Metabolic  eGFR (AFRICAN AMERICAN) 60 L >60 - calc  GLUCOSE 274 H 70-99 - mg/dL  BUN 24  6-26 - mg/dL  CREATININE 1.44 H 0.60-1.30 - mg/dl  eGFR (NON-AFRICAN AMERICAN) 49 L >60 - calc  SODIUM 139  136-145 - mmol/L  POTASSIUM 4.5  3.5-5.5 - mmol/L  CHLORIDE 107  98-107 - mmol/L  C02 25  22-32 - mmol/L  ANION GAP 11.5  6.0-20.0 - mmol/L  CALCIUM 9.6  8.6-10.3 - mg/dL   [name] 12/26/2012 05:48:48 PM > please forward to primary  MD [name] 12/27/2012 08:25:37 AM > Pt notified. To Bill. Koirala.        Lab: CBC with Diff Stable  WBC 4.0  4.0-11.0 - K/ul  RBC 5.47  4.20-5.80 - [name]/uL  HGB 13.7  13.0-17.0 - g/dL  HCT 42.5  39.0-52.0 - %  MCH 25.1 L 27.0-33.0 - pg  MPV 8.8  7.5-10.7 - fL  MCV 77.7 L 80.0-94.0 - fL  MCHC 32.3  32.0-36.0 - g/dL  RDW 20.6 H 11.5-15.5 - %  NRBC# 0.00  -   PLT 216  150-400 - K/uL  NEUT %   47.7  43.3-71.9 - %  NRBC% 0.00  - %  LYMPH% 39.4  16.8-43.5 - %  MONO % 6.7  4.6-12.4 - %  EOS % 5.0  0.0-7.8 - %  BASO % 1.2 H 0.0-1.0 - %  NEUT # 1.9  1.9-7.2 - K/uL  LYMPH# 1.60  1.10-2.70 - K/uL  MONO # 0.3  0.3-0.8 - K/uL  EOS # 0.2  0.0-0.6 - K/uL  BASO # 0.0  0.0-0.1 - K/uL   Alisah Grandberry [name] 12/26/2012 05:49:20 PM > please forward to [name] with GI [name] 12/27/2012 08:26:01 AM > Pt notified. To [name]. [name],JAMES 12/27/2012 08:42:16 AM >          Procedure Codes: 80048 ECL BMP, 85025 ECL CBC PLATELET DIFF, 36415 BLOOD COLLECTION ROUTINE VENIPUNCTURE       Follow Up: cath         Provider: Zaley Talley, MD  Patient: [name] DOB: 01/13/1951 Date: 12/26/2012       

## 2012-12-28 ENCOUNTER — Encounter (HOSPITAL_COMMUNITY): Payer: Self-pay

## 2012-12-28 ENCOUNTER — Other Ambulatory Visit: Payer: Self-pay | Admitting: *Deleted

## 2012-12-28 ENCOUNTER — Encounter (HOSPITAL_BASED_OUTPATIENT_CLINIC_OR_DEPARTMENT_OTHER)
Admission: RE | Disposition: A | Discharge status: Nursing Home (Includes ICF) | Payer: Self-pay | Source: Ambulatory Visit | Attending: Cardiology

## 2012-12-28 ENCOUNTER — Inpatient Hospital Stay (HOSPITAL_BASED_OUTPATIENT_CLINIC_OR_DEPARTMENT_OTHER)
Admission: RE | Admit: 2012-12-28 | Discharge: 2012-12-28 | Disposition: A | Discharge status: Nursing Home (Includes ICF) | Payer: 59 | Source: Ambulatory Visit | Attending: Cardiology | Admitting: Cardiology

## 2012-12-28 ENCOUNTER — Inpatient Hospital Stay (HOSPITAL_COMMUNITY)
Admission: AD | Admit: 2012-12-28 | Discharge: 2013-01-10 | DRG: 236 | Disposition: A | Discharge status: Home / Self Care | Payer: 59 | Source: Ambulatory Visit | Attending: Cardiothoracic Surgery | Admitting: Cardiothoracic Surgery

## 2012-12-28 DIAGNOSIS — I2510 Atherosclerotic heart disease of native coronary artery without angina pectoris: Secondary | ICD-10-CM

## 2012-12-28 DIAGNOSIS — Z794 Long term (current) use of insulin: Secondary | ICD-10-CM

## 2012-12-28 DIAGNOSIS — E785 Hyperlipidemia, unspecified: Secondary | ICD-10-CM | POA: Diagnosis present

## 2012-12-28 DIAGNOSIS — E119 Type 2 diabetes mellitus without complications: Secondary | ICD-10-CM | POA: Diagnosis present

## 2012-12-28 DIAGNOSIS — I708 Atherosclerosis of other arteries: Secondary | ICD-10-CM | POA: Diagnosis present

## 2012-12-28 DIAGNOSIS — Z87442 Personal history of urinary calculi: Secondary | ICD-10-CM

## 2012-12-28 DIAGNOSIS — I359 Nonrheumatic aortic valve disorder, unspecified: Secondary | ICD-10-CM | POA: Diagnosis present

## 2012-12-28 DIAGNOSIS — I200 Unstable angina: Secondary | ICD-10-CM | POA: Diagnosis present

## 2012-12-28 DIAGNOSIS — I70209 Unspecified atherosclerosis of native arteries of extremities, unspecified extremity: Secondary | ICD-10-CM | POA: Diagnosis present

## 2012-12-28 DIAGNOSIS — Z7902 Long term (current) use of antithrombotics/antiplatelets: Secondary | ICD-10-CM

## 2012-12-28 DIAGNOSIS — R0602 Shortness of breath: Secondary | ICD-10-CM | POA: Insufficient documentation

## 2012-12-28 DIAGNOSIS — T82897A Other specified complication of cardiac prosthetic devices, implants and grafts, initial encounter: Secondary | ICD-10-CM | POA: Insufficient documentation

## 2012-12-28 DIAGNOSIS — I10 Essential (primary) hypertension: Secondary | ICD-10-CM | POA: Diagnosis present

## 2012-12-28 DIAGNOSIS — I252 Old myocardial infarction: Secondary | ICD-10-CM

## 2012-12-28 DIAGNOSIS — I2584 Coronary atherosclerosis due to calcified coronary lesion: Secondary | ICD-10-CM | POA: Diagnosis present

## 2012-12-28 DIAGNOSIS — K5900 Constipation, unspecified: Secondary | ICD-10-CM | POA: Diagnosis not present

## 2012-12-28 DIAGNOSIS — D62 Acute posthemorrhagic anemia: Secondary | ICD-10-CM | POA: Diagnosis not present

## 2012-12-28 DIAGNOSIS — Y831 Surgical operation with implant of artificial internal device as the cause of abnormal reaction of the patient, or of later complication, without mention of misadventure at the time of the procedure: Secondary | ICD-10-CM | POA: Insufficient documentation

## 2012-12-28 DIAGNOSIS — Z79899 Other long term (current) drug therapy: Secondary | ICD-10-CM

## 2012-12-28 DIAGNOSIS — I209 Angina pectoris, unspecified: Secondary | ICD-10-CM | POA: Insufficient documentation

## 2012-12-28 DIAGNOSIS — Y842 Radiological procedure and radiotherapy as the cause of abnormal reaction of the patient, or of later complication, without mention of misadventure at the time of the procedure: Secondary | ICD-10-CM | POA: Diagnosis present

## 2012-12-28 DIAGNOSIS — Y832 Surgical operation with anastomosis, bypass or graft as the cause of abnormal reaction of the patient, or of later complication, without mention of misadventure at the time of the procedure: Secondary | ICD-10-CM | POA: Diagnosis present

## 2012-12-28 DIAGNOSIS — N529 Male erectile dysfunction, unspecified: Secondary | ICD-10-CM | POA: Diagnosis present

## 2012-12-28 DIAGNOSIS — D509 Iron deficiency anemia, unspecified: Secondary | ICD-10-CM | POA: Diagnosis present

## 2012-12-28 DIAGNOSIS — Z951 Presence of aortocoronary bypass graft: Secondary | ICD-10-CM

## 2012-12-28 DIAGNOSIS — E8779 Other fluid overload: Secondary | ICD-10-CM | POA: Diagnosis not present

## 2012-12-28 DIAGNOSIS — I498 Other specified cardiac arrhythmias: Secondary | ICD-10-CM | POA: Diagnosis not present

## 2012-12-28 DIAGNOSIS — E1149 Type 2 diabetes mellitus with other diabetic neurological complication: Secondary | ICD-10-CM | POA: Diagnosis present

## 2012-12-28 DIAGNOSIS — E1142 Type 2 diabetes mellitus with diabetic polyneuropathy: Secondary | ICD-10-CM | POA: Diagnosis present

## 2012-12-28 DIAGNOSIS — T66XXXS Radiation sickness, unspecified, sequela: Secondary | ICD-10-CM

## 2012-12-28 DIAGNOSIS — K219 Gastro-esophageal reflux disease without esophagitis: Secondary | ICD-10-CM | POA: Diagnosis present

## 2012-12-28 DIAGNOSIS — Z85830 Personal history of malignant neoplasm of bone: Secondary | ICD-10-CM

## 2012-12-28 LAB — BLOOD GAS, ARTERIAL
Acid-base deficit: 1.1 mmol/L (ref 0.0–2.0)
Bicarbonate: 22.9 mEq/L (ref 20.0–24.0)
Drawn by: 277551
FIO2: 0.21 %
O2 Saturation: 95.8 %
Patient temperature: 98.6
TCO2: 24 mmol/L (ref 0–100)
pCO2 arterial: 36.6 mmHg (ref 35.0–45.0)
pH, Arterial: 7.412 (ref 7.350–7.450)
pO2, Arterial: 71.1 mmHg — ABNORMAL LOW (ref 80.0–100.0)

## 2012-12-28 LAB — CBC WITH DIFFERENTIAL/PLATELET
Basophils Relative: 1 % (ref 0–1)
HCT: 40.8 % (ref 39.0–52.0)
Hemoglobin: 14.3 g/dL (ref 13.0–17.0)
Lymphocytes Relative: 50 % — ABNORMAL HIGH (ref 12–46)
MCHC: 35 g/dL (ref 30.0–36.0)
Monocytes Absolute: 0.2 10*3/uL (ref 0.1–1.0)
Monocytes Relative: 6 % (ref 3–12)
Neutro Abs: 1.4 10*3/uL — ABNORMAL LOW (ref 1.7–7.7)
Neutrophils Relative %: 37 % — ABNORMAL LOW (ref 43–77)
RBC: 5.49 MIL/uL (ref 4.22–5.81)
WBC: 3.7 10*3/uL — ABNORMAL LOW (ref 4.0–10.5)

## 2012-12-28 LAB — COMPREHENSIVE METABOLIC PANEL
AST: 16 U/L (ref 0–37)
Albumin: 3.6 g/dL (ref 3.5–5.2)
Alkaline Phosphatase: 76 U/L (ref 39–117)
BUN: 19 mg/dL (ref 6–23)
CO2: 26 mEq/L (ref 19–32)
Chloride: 106 mEq/L (ref 96–112)
GFR calc non Af Amer: 52 mL/min — ABNORMAL LOW (ref >90)
Potassium: 4.2 mEq/L (ref 3.5–5.1)
Total Bilirubin: 0.3 mg/dL (ref 0.3–1.2)

## 2012-12-28 LAB — MAGNESIUM: Magnesium: 2.1 mg/dL (ref 1.5–2.5)

## 2012-12-28 LAB — GLUCOSE, CAPILLARY
Glucose-Capillary: 295 mg/dL — ABNORMAL HIGH (ref 70–99)
Glucose-Capillary: 238 mg/dL — ABNORMAL HIGH (ref 70–99)

## 2012-12-28 LAB — POCT I-STAT GLUCOSE
Glucose, Bld: 199 mg/dL — ABNORMAL HIGH (ref 70–99)
Operator id: 144271

## 2012-12-28 SURGERY — JV LEFT HEART CATHETERIZATION WITH CORONARY ANGIOGRAM

## 2012-12-28 MED ORDER — SIMVASTATIN 40 MG PO TABS
40.0000 mg | ORAL_TABLET | Freq: Every evening | ORAL | Status: DC
  Administered 2012-12-29 – 2013-01-09 (×11): 40 mg via ORAL
  Filled 2012-12-28 (×13): qty 1

## 2012-12-28 MED ORDER — ASPIRIN 81 MG PO CHEW: 81.0000 mg | CHEWABLE_TABLET | Freq: Every day | ORAL | Status: DC

## 2012-12-28 MED ORDER — ASPIRIN EC 81 MG PO TBEC
81.0000 mg | DELAYED_RELEASE_TABLET | Freq: Every day | ORAL | Status: DC
  Administered 2012-12-29 – 2013-01-03 (×6): 81 mg via ORAL
  Filled 2012-12-28 (×7): qty 1

## 2012-12-28 MED ORDER — INSULIN ASPART 100 UNIT/ML ~~LOC~~ SOLN
0.0000 [IU] | Freq: Every day | SUBCUTANEOUS | Status: DC
  Administered 2012-12-28 – 2013-01-01 (×2): 2 [IU] via SUBCUTANEOUS
  Administered 2013-01-02: 3 [IU] via SUBCUTANEOUS
  Administered 2013-01-03: 2 [IU] via SUBCUTANEOUS

## 2012-12-28 MED ORDER — AMITRIPTYLINE HCL 100 MG PO TABS
100.0000 mg | ORAL_TABLET | Freq: Every day | ORAL | Status: DC
  Administered 2012-12-28 – 2013-01-01 (×5): 100 mg via ORAL
  Filled 2012-12-28 (×6): qty 1

## 2012-12-28 MED ORDER — INSULIN ASPART 100 UNIT/ML ~~LOC~~ SOLN
0.0000 [IU] | Freq: Three times a day (TID) | SUBCUTANEOUS | Status: DC
  Administered 2012-12-29: 8 [IU] via SUBCUTANEOUS
  Administered 2012-12-29: 5 [IU] via SUBCUTANEOUS
  Administered 2012-12-30: 8 [IU] via SUBCUTANEOUS
  Administered 2012-12-30 (×2): 5 [IU] via SUBCUTANEOUS
  Administered 2012-12-31: 8 [IU] via SUBCUTANEOUS
  Administered 2012-12-31: 2 [IU] via SUBCUTANEOUS
  Administered 2012-12-31: 3 [IU] via SUBCUTANEOUS
  Administered 2013-01-01: 5 [IU] via SUBCUTANEOUS
  Administered 2013-01-01: 3 [IU] via SUBCUTANEOUS
  Administered 2013-01-01: 2 [IU] via SUBCUTANEOUS
  Administered 2013-01-02 (×2): 5 [IU] via SUBCUTANEOUS
  Administered 2013-01-03: 8 [IU] via SUBCUTANEOUS
  Administered 2013-01-03: 3 [IU] via SUBCUTANEOUS

## 2012-12-28 MED ORDER — ASPIRIN 81 MG PO CHEW
324.0000 mg | CHEWABLE_TABLET | ORAL | Status: AC
  Administered 2012-12-28: 324 mg via ORAL

## 2012-12-28 MED ORDER — SODIUM CHLORIDE 0.9 % IV SOLN: 1.0000 mL/kg/h | INTRAVENOUS | Status: DC

## 2012-12-28 MED ORDER — ONDANSETRON HCL 4 MG/2ML IJ SOLN: 4.0000 mg | Freq: Four times a day (QID) | INTRAMUSCULAR | Status: DC | PRN

## 2012-12-28 MED ORDER — INSULIN ASPART 100 UNIT/ML ~~LOC~~ SOLN: 6.0000 [IU] | Freq: Three times a day (TID) | SUBCUTANEOUS | Status: DC

## 2012-12-28 MED ORDER — LOSARTAN POTASSIUM 25 MG PO TABS
25.0000 mg | ORAL_TABLET | Freq: Every day | ORAL | Status: DC
  Administered 2012-12-29 – 2013-01-03 (×6): 25 mg via ORAL
  Filled 2012-12-28 (×7): qty 1

## 2012-12-28 MED ORDER — POLYSACCHARIDE IRON COMPLEX 150 MG PO CAPS
300.0000 mg | ORAL_CAPSULE | Freq: Every day | ORAL | Status: DC
  Administered 2012-12-29 – 2013-01-03 (×6): 300 mg via ORAL
  Filled 2012-12-28 (×6): qty 2

## 2012-12-28 MED ORDER — NITROGLYCERIN IN D5W 200-5 MCG/ML-% IV SOLN: 2.0000 ug/min | INTRAVENOUS | Status: DC | PRN

## 2012-12-28 MED ORDER — NITROGLYCERIN 0.4 MG SL SUBL: 0.4000 mg | SUBLINGUAL_TABLET | SUBLINGUAL | Status: DC | PRN

## 2012-12-28 MED ORDER — INSULIN GLARGINE 100 UNIT/ML ~~LOC~~ SOLN
40.0000 [IU] | Freq: Every day | SUBCUTANEOUS | Status: DC
  Administered 2012-12-28 – 2013-01-03 (×7): 40 [IU] via SUBCUTANEOUS
  Filled 2012-12-28 (×9): qty 0.4

## 2012-12-28 MED ORDER — ACETAMINOPHEN 325 MG PO TABS: 650.0000 mg | ORAL_TABLET | ORAL | Status: DC | PRN

## 2012-12-28 MED ORDER — DIAZEPAM 5 MG PO TABS
5.0000 mg | ORAL_TABLET | ORAL | Status: AC
  Administered 2012-12-28: 5 mg via ORAL

## 2012-12-28 MED ORDER — SODIUM CHLORIDE 0.9 % IV SOLN: 250.0000 mL | INTRAVENOUS | Status: DC | PRN

## 2012-12-28 MED ORDER — ACETAMINOPHEN 325 MG PO TABS
650.0000 mg | ORAL_TABLET | ORAL | Status: DC | PRN
  Administered 2013-01-02: 650 mg via ORAL

## 2012-12-28 MED ORDER — HEPARIN (PORCINE) IN NACL 100-0.45 UNIT/ML-% IJ SOLN
1200.0000 [IU]/h | INTRAMUSCULAR | Status: DC
  Administered 2012-12-28: 1200 [IU]/h via INTRAVENOUS
  Filled 2012-12-28 (×2): qty 250

## 2012-12-28 MED ORDER — SODIUM CHLORIDE 0.9 % IV SOLN
INTRAVENOUS | Status: DC
  Administered 2012-12-28: 12:00:00 via INTRAVENOUS

## 2012-12-28 MED ORDER — SODIUM CHLORIDE 0.9 % IJ SOLN: 3.0000 mL | INTRAMUSCULAR | Status: DC | PRN

## 2012-12-28 MED ORDER — ZOLPIDEM TARTRATE 5 MG PO TABS
10.0000 mg | ORAL_TABLET | Freq: Every evening | ORAL | Status: DC | PRN
  Administered 2012-12-28 – 2013-01-04 (×7): 10 mg via ORAL
  Filled 2012-12-28 (×7): qty 2

## 2012-12-28 MED ORDER — ACETAMINOPHEN 325 MG PO TABS
650.0000 mg | ORAL_TABLET | ORAL | Status: DC | PRN
  Filled 2012-12-28 (×2): qty 2

## 2012-12-28 MED ORDER — METOPROLOL SUCCINATE ER 100 MG PO TB24
100.0000 mg | ORAL_TABLET | Freq: Every day | ORAL | Status: DC
  Administered 2012-12-29 – 2013-01-03 (×6): 100 mg via ORAL
  Filled 2012-12-28 (×7): qty 1

## 2012-12-28 MED ORDER — SODIUM CHLORIDE 0.9 % IJ SOLN: 3.0000 mL | Freq: Two times a day (BID) | INTRAMUSCULAR | Status: DC

## 2012-12-28 NOTE — H&P (View-Only) (Signed)
Office Visit     Patient: [name], [name] Account Number: [id number] Provider: Armanda Magic, MD  DOB: [birth year] Age: 62 Y Sex: Male Date: 12/26/2012  Phone: [phone]   Address: [address], [place], [place]  Pcp: Darrow Bussing          1. TT/sob.        HPI:  General:  The patient presents today for follouwp of his SOB, CAD and HTN. He recently saw Dr. Katrinka Blazing for an episode of chest pain and since then has had an episode of severe SOB while on the treadmill at cardiac rehab. He has a history of iron deficiency anemia and was seen by Dr. Randa Evens this am. His last Hgb was 10 and he does not think his SOB is due to anemia. He has continued to have chest pain which only occurs with exertion. He is very active and has gone from jogging on the treadmill to now he cannot even walk on the treadmill due to chest pain and SOB..        ROS:  See HPI, A twelve system review was perfomed at today's visit. For pertinent positives and negatives see HPI.       Medical History: AAA negative 11/11 CT abdomen- no aneurysm aortic, diabetes mellitus with neuropathy, (Dr Talmage Nap) sees Dr. Talmage Nap, Allergies, Lenox Ahr, Erectile dysfunction, Hypertension, Elevated cholesterol, Kidney stones-lithotripsy plan april/2012,dr,kimbrough, Colon due 6-16, history of TIAs, EF 55-60percent, MILD AI,MR,TR 02/2012, CAD with 60% stenosis of mid RCA, 80% prox left circ, 99% distal left cir, normal LAD, normal LVF s/p PCI of left circ/OM, 02/2012, normal EGD 12/13, Colon- 12/13- colonoscopy grossly normal- poor prep, stool cards positive, Dr. Randa Evens Anemia resolved on IV iron.        Surgical History: Mediastinal Seminoma removed (benign) 1985, colonoscopy- 02/11/10, 10/12/07, 10/18/06 , EGD- 08/05/10 , kidney stone 08/2010.        Family History: Father: deceased 23 yrs, diabetesMother: deceased 3 yrs, diabetes, hypertension, CHFBrother 1: deceased 61 yrs, Cancer PancreaticBrother2: alive 42 yrs, A + WSister 1: alive 65  yrs, A + WSister 2: deceased 71 yrs, diabetes       Social History:  General:  History of smoking  cigarettes: Former smoker Quit in year 1986 no Smoking.  no Alcohol.  Caffeine: yes, coffee, 1 serving daily.  no Recreational drug use.  no Diet.  Exercise: yes, walks, eliptical, 4x a week.  Occupation: employed, [employer].  Marital Status: married, twice. This time for 15 years. "It is what it is.".  Children: 2, son (s).  Seat belt use: yes.  Originally from [place], [place].       Medications: Taking Nu-Iron 150 MG Capsule 2 capsules once a day, Taking Ambien 10 MG Tablet 1 tablet at bedtime before bedtime, qhs prn insomnia, Taking Amitriptyline HCl 100 Milligram Tablet TAKE 1 TABLET BY MOUTH EVERY NIGHT AT BEDTIME at bedtime, Taking Isosorbide Mononitrate CR 60 MG Tablet Extended Release 24 Hour 1 tablet Once a day, Taking Levemir 100 UNIT/ML Solution 40units at bedtime, Taking Multi For Him Tablet 1 tablet once a day, Taking metformin 500 mg tab 2 tablets twice a day, Taking NovoLog 100 UNIT/ML Solution sliding scale , Taking Plavix 75 MG Tablet 1 tablet Once a day, Taking Simvastatin 40 MG Tablet 1 tablet every evening Once a day, Taking Losartan Potassium 25 MG Tablet 1 tablet Once a day, Taking Metoprolol Succinate 100 MG Tablet Extended Release 24 Hour 1 tablet Once a day,  Taking Nitroglycerin 0.4 mg 0.4 mg tablet 1 tablet as directed , Medication List reviewed and reconciled with the patient       Allergies: Lipitor: cramps: Side Effects.           Vitals: Wt 187.4, Wt change .4 lb, Ht 69.75, BMI 27.08, Pulse sitting 84, BP sitting 102/82.       Examination:  Cardiology, General:  GENERAL APPEARANCE: pleasant, NAD.  HEENT: unremarkable.  CAROTID UPSTROKE: normal, no bruit.  JVD: flat.  HEART SOUNDS: regular, normal S1, S2, no S3 or S4.  MURMUR: absent.  LUNGS: no rales or wheezes.  ABDOMEN: soft, non tender, positive bowel sounds, no  masses felt.  EXTREMITIES: no leg edema.  PERIPHERAL PULSES: 2 plus bilateral.            Assessment:  1. Exertional angina - 413.9 (Primary)  2. CAD (coronary artery disease), native coronary artery - 414.01  3. Hypertension - 401.9        1. Exertional angina  LAB: PT (Prothrombin Time) (086578) LAB: Basic Metabolic LAB: CBC with Diff Notes: He continues to exertional chest pain and DOE but no CP at rest. I have recommended that we proceed with cardiac cath to evaluate coronary anatomy. , Risks and benefits of cardiac catheterization have been reviewed including risk of stroke, heart attack, death, bleeding, renal impariment and arterial damage. There was ample oppurtuny to answer questions. Alternatives were discussed. Patient understands and wishes to proceed.       2. CAD (coronary artery disease), native coronary artery  Continue Plavix Tablet, 75 MG, 1 tablet, Orally, Once a day ; Continue Isosorbide Mononitrate CR Tablet Extended Release 24 Hour, 60 MG, 1 tablet, Orally, Once a day .       3. Hypertension  Continue Metoprolol Succinate Tablet Extended Release 24 Hour, 100 MG, 1 tablet, Orally, Once a day ; Continue Losartan Potassium Tablet, 25 MG, 1 tablet, Orally, Once a day .        Procedures:  Venipuncture:  Venipuncture: Gasanova,Svetlana 12/26/2012 11:56:58 AM > , performed in right arm.           Labs:    Lab: PT (Prothrombin Time) ([id number])  Prothrombin Time 10.5  9.1-12.0 - SEC  INR 1.0  0.8-1.2 -           Lab: Basic Metabolic  eGFR (AFRICAN AMERICAN) 60 L >60 - calc  GLUCOSE 274 H 70-99 - mg/dL  BUN 24  5-28 - mg/dL  CREATININE 4.13 H 2.44-0.10 - mg/dl  eGFR (NON-AFRICAN AMERICAN) 49 L >60 - calc  SODIUM 139  136-145 - mmol/L  POTASSIUM 4.5  3.5-5.5 - mmol/L  CHLORIDE 107  98-107 - mmol/L  C02 25  22-32 - mmol/L  ANION GAP 11.5  6.0-20.0 - mmol/L  CALCIUM 9.6  8.6-10.3 - mg/dL   TURNER,TRACI M 27/25/3664 05:48:48 PM > please forward to primary  MD Harward,Amy 12/27/2012 08:25:37 AM > Pt notified. To Dr. Docia Chuck.        Lab: CBC with Diff Stable  WBC 4.0  4.0-11.0 - K/ul  RBC 5.47  4.20-5.80 - M/uL  HGB 13.7  13.0-17.0 - g/dL  HCT 40.3  47.4-25.9 - %  MCH 25.1 L 27.0-33.0 - pg  MPV 8.8  7.5-10.7 - fL  MCV 77.7 L 80.0-94.0 - fL  MCHC 32.3  32.0-36.0 - g/dL  RDW 56.3 H 87.5-64.3 - %  NRBC# 0.00  -   PLT 216  150-400 - K/uL  NEUT %  47.7  43.3-71.9 - %  NRBC% 0.00  - %  LYMPH% 39.4  16.8-43.5 - %  MONO % 6.7  4.6-12.4 - %  EOS % 5.0  0.0-7.8 - %  BASO % 1.2 H 0.0-1.0 - %  NEUT # 1.9  1.9-7.2 - K/uL  LYMPH# 1.60  1.10-2.70 - K/uL  MONO # 0.3  0.3-0.8 - K/uL  EOS # 0.2  0.0-0.6 - K/uL  BASO # 0.0  0.0-0.1 - K/uL   TURNER,TRACI M 12/26/2012 05:49:20 PM > please forward to Dr. Randa Evens with GI Harward,Amy 12/27/2012 08:26:01 AM > Pt notified. To Dr. Randa Evens. EDWARDS,JAMES 12/27/2012 08:42:16 AM >          Procedure Codes: 16109 ECL BMP, 85025 ECL CBC PLATELET DIFF, 60454 BLOOD COLLECTION ROUTINE VENIPUNCTURE       Follow Up: cath         Provider: Armanda Magic, MD  Patient: [name], [name] DOB: 27-Sep-1950 Date: 12/26/2012
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
## 2012-12-28 NOTE — Progress Notes (Signed)
Verbal order received from Dr. Mayford Knife to start Ntg gtt at 65mcg/min.  Pharmacy called to verify shortage of this med.  Pt not having active CP, not a STEMI.  Pharmacist suggested we put the order in as prn for CP.  Paged Dr. Mayford Knife to notify of this issue.  Page not returned.  Night shift RN was also notified and she will f/u tonight. Advised pt to notify RN immediately of any CP.  Will continue to monitor.

## 2012-12-28 NOTE — Progress Notes (Signed)
ANTICOAGULATION CONSULT NOTE - Initial Consult  Pharmacy Consult for UFH Indication: ACS/CAD  Allergies  Allergen Reactions  . Lipitor (Atorvastatin) Other (See Comments)    myalgias    Patient Measurements: Height: 5' 10.87" (180 cm) Weight: 192 lb 0.3 oz (87.1 kg) IBW/kg (Calculated) : 74.99 Heparin Dosing Weight: 87kg  Vital Signs: Temp: 98.1 F (36.7 C) (05/16 1640) Temp src: Oral (05/16 1640) BP: 112/67 mmHg (05/16 1640) Pulse Rate: 83 (05/16 1640)  Labs: No results found for this basename: HGB, HCT, PLT, APTT, LABPROT, INR, HEPARINUNFRC, CREATININE, CKTOTAL, CKMB, TROPONINI,  in the last 72 hours  Estimated Creatinine Clearance: 66.3 ml/min (by C-G formula based on Cr of 1.21).   Medical History: Past Medical History  Diagnosis Date  . Erectile dysfunction   . Hypertension   . Dyslipidemia   . Chronic kidney disease     kidney stones s/p lithotripsy  . Stroke     TIA's  . Cancer     mediastinal seminoma resected 1985 - benign  . Chest pain   . Shortness of breath   . Diabetes mellitus     neuropathy  insulin dependent  . GERD (gastroesophageal reflux disease)   . NSTEMI (non-ST elevated myocardial infarction)   . Coronary artery disease 02/2012    60% mid RCA, 80% prox left circ, 99% distal left circ, normal LAD s/p PCI left circ/OM    Medications:  Prescriptions prior to admission  Medication Sig Dispense Refill  . amitriptyline (ELAVIL) 100 MG tablet Take 100 mg by mouth at bedtime.      . clopidogrel (PLAVIX) 75 MG tablet Take 75 mg by mouth daily.      . insulin aspart (NOVOLOG) 100 UNIT/ML injection Inject 6-14 Units into the skin 3 (three) times daily before meals. Patient's dose is done on a sliding scale. Usually takes 8 units, but dose varies depending on glucose level.      . insulin detemir (LEVEMIR) 100 UNIT/ML injection Inject 40 Units into the skin at bedtime.      . insulin glargine (LANTUS) 100 UNIT/ML injection Inject 40 Units into the  skin at bedtime.      . iron polysaccharides (NIFEREX) 150 MG capsule Take 150 mg by mouth daily.      . isosorbide mononitrate (IMDUR) 30 MG 24 hr tablet Take 60 mg by mouth daily.      Marland Kitchen lisinopril (PRINIVIL,ZESTRIL) 2.5 MG tablet Take 2.5 mg by mouth daily.      . metFORMIN (GLUCOPHAGE) 500 MG tablet Take 2 tablets (1,000 mg total) by mouth 2 (two) times daily with a meal. Do not restart Metformin until 7/26      . metoprolol succinate (TOPROL-XL) 50 MG 24 hr tablet Take 100 mg by mouth daily. Take with or immediately following a meal.      . Multiple Vitamin (MULTIVITAMIN) tablet Take 1 tablet by mouth daily.      . nitroGLYCERIN (NITROSTAT) 0.4 MG SL tablet Place 1 tablet (0.4 mg total) under the tongue every 5 (five) minutes x 3 doses as needed for chest pain.  30 tablet  5  . Omega-3 Fatty Acids (FISH OIL MAXIMUM STRENGTH) 1200 MG CAPS Take 3,600 mg by mouth 2 (two) times daily.      Marland Kitchen omeprazole (PRILOSEC) 20 MG capsule Take 20 mg by mouth daily as needed. For heart burn      . simvastatin (ZOCOR) 40 MG tablet Take 40 mg by mouth every evening.      Marland Kitchen  zolpidem (AMBIEN) 10 MG tablet Take 10 mg by mouth at bedtime as needed. For sleep        Assessment: 62 y/o male patient admitted s/p cardiac cath, found to have severe 3 vessel CAD requiring CVTS consult for possible OHS. Plan to start heparin 8 hours after sheath pull, which would be 2200 with no bolus.  Goal of Therapy:  Heparin level 0.3-0.7 units/ml Monitor platelets by anticoagulation protocol: Yes   Plan:  Begin heparin gtt at 1200 units/hr at 2200 today and f/u am heparin level with cbc.  Verlene Mayer, PharmD, BCPS Pager [phone] 12/28/2012,6:40 PM

## 2012-12-28 NOTE — Interval H&P Note (Signed)
History and Physical Interval Note:  12/28/2012 12:56 PM  [name]  has presented today for surgery, with the diagnosis of cp  The various methods of treatment have been discussed with the patient and family. After consideration of risks, benefits and other options for treatment, the patient has consented to  Procedure(s): JV LEFT HEART CATHETERIZATION WITH CORONARY ANGIOGRAM (N/A) as a surgical intervention .  The patient's history has been reviewed, patient examined, no change in status, stable for surgery.  I have reviewed the patient's chart and labs.  Questions were answered to the patient's satisfaction.     TURNER,[name]

## 2012-12-28 NOTE — Progress Notes (Signed)
Report called to [facility] on 3000, transported to 3 [address] 17 on monitor and stretcher.

## 2012-12-28 NOTE — CV Procedure (Signed)
PROCEDURE:  Left heart catheterization with selective coronary angiography, left ventriculogram.  INDICATIONS:  SOB/CAD  The risks, benefits, and details of the procedure were explained to the patient.  The patient verbalized understanding and wanted to proceed.  Informed written consent was obtained.  PROCEDURE TECHNIQUE:  After Xylocaine anesthesia a 25F sheath was placed in the right femoral artery with a single anterior needle wall stick.   Left coronary angiography was done using a Judkins L4 guide catheter.  Right coronary angiography was done using a Judkins R4 guide catheter.  Left ventriculography was done using a pigtail catheter.    CONTRAST:  Total of 55 cc.  COMPLICATIONS:  None.    HEMODYNAMICS:  Aortic pressure was 115/44mmHg; LV pressure was 111/65mmHg; LVEDP .  There was no gradient between the left ventricle and aorta.    ANGIOGRAPHIC DATA:   The left main coronary artery is very short with luminal irregularities.  The left anterior descending artery is patent with luminal irregularities.  It gives rise to a first diagonal which is small and bifurcates into 2 daughter vessels which are patent.  The ongoing LAD gives rise to a second diagonal which is patent.  The ongoing LAD traverses to the apex and is patent.  The left circumflex artery is patent in the ostial portion and then there is a high grade greater than 90% stenosis in the proximal stent in the left circumflex that extends into the left main.  The ongoing left circ is small and gives rise to a large OM1 with a patent stent.   The right coronary artery is an ostial 99% lesion.  The onging RCA has 60^ stenosis in the mid RCA.  Distally it gives rise to a PL and PDA branches with are small in caliber.  LEFT VENTRICULOGRAM:  Left ventricular angiogram was done in the 30 RAO projection and revealed normal left ventricular wall motion and systolic function with an estimated ejection fraction of 55%.  LVEDP was 13  mmHg.  IMPRESSIONS:  1. Short left main coronary artery with luminal irregularities. 2. Calcified left anterior descending artery with luminal irregularities and its branches. 3. High grade instent restenosis of the left circumflex stent with extension in the the ostium. 4. Subtotalled ostial RCA with left to right collaterals from the LAD/Circ system. 5. Normal left ventricular systolic function.  LVEDP 13 mmHg.  Ejection fraction 55%.  RECOMMENDATION:   1.  Admit to tele bed 2.  IV Heparin gtt per pharmacy 3.  IV NTG gtt 4.  Continue ASA and stop Plavix 5.  Hold Metformin 6.  Continue beta blocker/statin and ACE I 7.  Films reviewed with Dr. Eldridge Dace - left system heavily calcified and subtotalled ostial RCA in a patient with a drug eluting stent that is less than 1 year out.  Recommend CVTS consult.

## 2012-12-28 NOTE — Progress Notes (Signed)
Bedrest begins @ 1355, tegaderm dressing applied to right groin site by Army Melia, site level 0.

## 2012-12-29 ENCOUNTER — Encounter (HOSPITAL_COMMUNITY): Payer: Self-pay | Admitting: *Deleted

## 2012-12-29 DIAGNOSIS — I200 Unstable angina: Secondary | ICD-10-CM

## 2012-12-29 DIAGNOSIS — I2510 Atherosclerotic heart disease of native coronary artery without angina pectoris: Principal | ICD-10-CM

## 2012-12-29 DIAGNOSIS — Z01810 Encounter for preprocedural cardiovascular examination: Secondary | ICD-10-CM

## 2012-12-29 LAB — BASIC METABOLIC PANEL
BUN: 18 mg/dL (ref 6–23)
CO2: 22 mEq/L (ref 19–32)
Calcium: 9.2 mg/dL (ref 8.4–10.5)
Creatinine, Ser: 1.32 mg/dL (ref 0.50–1.35)
Glucose, Bld: 278 mg/dL — ABNORMAL HIGH (ref 70–99)

## 2012-12-29 LAB — SURGICAL PCR SCREEN
MRSA, PCR: NEGATIVE
Staphylococcus aureus: NEGATIVE

## 2012-12-29 LAB — HEPARIN LEVEL (UNFRACTIONATED): Heparin Unfractionated: 0.32 IU/mL (ref 0.30–0.70)

## 2012-12-29 LAB — CBC
Hemoglobin: 15.8 g/dL (ref 13.0–17.0)
MCH: 26.2 pg (ref 26.0–34.0)
MCHC: 35.2 g/dL (ref 30.0–36.0)
MCV: 74.3 fL — ABNORMAL LOW (ref 78.0–100.0)
RBC: 6.04 MIL/uL — ABNORMAL HIGH (ref 4.22–5.81)

## 2012-12-29 LAB — GLUCOSE, CAPILLARY: Glucose-Capillary: 115 mg/dL — ABNORMAL HIGH (ref 70–99)

## 2012-12-29 LAB — TSH: TSH: 7.486 u[IU]/mL — ABNORMAL HIGH (ref 0.350–4.500)

## 2012-12-29 MED ORDER — DOCUSATE SODIUM 100 MG PO CAPS
100.0000 mg | ORAL_CAPSULE | Freq: Every day | ORAL | Status: DC
  Administered 2012-12-29 – 2013-01-03 (×6): 100 mg via ORAL
  Filled 2012-12-29 (×7): qty 1

## 2012-12-29 MED ORDER — HEPARIN (PORCINE) IN NACL 100-0.45 UNIT/ML-% IJ SOLN
1400.0000 [IU]/h | INTRAMUSCULAR | Status: DC
Start: 2012-12-29 — End: 2013-01-04
  Administered 2012-12-29: 1400 [IU]/h via INTRAVENOUS
  Administered 2012-12-31 – 2013-01-02 (×4): 1500 [IU]/h via INTRAVENOUS
  Administered 2013-01-03 (×2): 1400 [IU]/h via INTRAVENOUS
  Filled 2012-12-29 (×9): qty 250

## 2012-12-29 NOTE — Progress Notes (Signed)
PROGRESS NOTE  Subjective:   Mr. Elizebeth Brooking is a 62 yo with CAD - stenosis of prox LCx and ostial RCA.  The plan is for CABG.   He is pain free.   Objective:    Vital Signs:   Temp:  [97.9 F (36.6 C)-98.2 F (36.8 C)] 98 F (36.7 C) (05/17 0400) Pulse Rate:  [78-91] 90 (05/17 0400) Resp:  [16-18] 18 (05/16 1640) BP: (103-142)/(58-96) 131/82 mmHg (05/17 0400) SpO2:  [94 %-100 %] 97 % (05/17 0400) Weight:  [192 lb (87.091 kg)-192 lb 0.3 oz (87.1 kg)] 192 lb 0.3 oz (87.1 kg) (05/16 1640)  Last BM Date: 12/27/12   24-hour weight change: Weight change:   Weight trends: Filed Weights   12/28/12 1640  Weight: 192 lb 0.3 oz (87.1 kg)    Intake/Output:  05/16 0701 - 05/17 0700 In: 360 [P.O.:360] Out: -      Physical Exam: BP 131/82  Pulse 90  Temp(Src) 98 F (36.7 C) (Oral)  Resp 18  Ht 5' 10.87" (1.8 m)  Wt 192 lb 0.3 oz (87.1 kg)  BMI 26.88 kg/m2  SpO2 97%  General: Vital signs reviewed and noted.   Head: Normocephalic, atraumatic.  Eyes: conjunctivae/corneas clear.  EOM's intact.   Throat: normal  Neck:  normal  Lungs:  clear  Heart:  RR, normal S1, S2  Abdomen:  Soft, non-tender, non-distended    Extremities: No edema, cath site is ok   Neurologic: A&O X3, CN II - XII are grossly intact.   Psych: Normal     Labs: BMET:  Recent Labs  12/28/12 1332 12/28/12 2243  NA  --  140  K  --  4.2  CL  --  106  CO2  --  26  GLUCOSE 199* 207*  BUN  --  19  CREATININE  --  1.41*  CALCIUM  --  9.0  MG  --  2.1    Liver function tests:  Recent Labs  12/28/12 2243  AST 16  ALT 28  ALKPHOS 76  BILITOT 0.3  PROT 6.5  ALBUMIN 3.6   No results found for this basename: LIPASE, AMYLASE,  in the last 72 hours  CBC:  Recent Labs  12/28/12 2243  WBC 3.7*  NEUTROABS 1.4*  HGB 14.3  HCT 40.8  MCV 74.3*  PLT 218    Cardiac Enzymes: No results found for this basename: CKTOTAL, CKMB, TROPONINI,  in the last 72 hours  Coagulation Studies: No  results found for this basename: LABPROT, INR,  in the last 72 hours  Other: No components found with this basename: POCBNP,  No results found for this basename: DDIMER,  in the last 72 hours No results found for this basename: HGBA1C,  in the last 72 hours No results found for this basename: CHOL, HDL, LDLCALC, TRIG, CHOLHDL,  in the last 72 hours No results found for this basename: TSH, T4TOTAL, FREET3, T3FREE, THYROIDAB,  in the last 72 hours No results found for this basename: VITAMINB12, FOLATE, FERRITIN, TIBC, IRON, RETICCTPCT,  in the last 72 hours   Other results:  Tele:  NSR  Medications:    Infusions: . heparin 1,200 Units/hr (12/28/12 2242)  . nitroGLYCERIN      Scheduled Medications: . amitriptyline  100 mg Oral QHS  . aspirin EC  81 mg Oral Daily  . insulin aspart  0-15 Units Subcutaneous TID WC  . insulin aspart  0-5 Units Subcutaneous QHS  . insulin aspart  6-14  Units Subcutaneous TID AC  . insulin glargine  40 Units Subcutaneous QHS  . iron polysaccharides  300 mg Oral Daily  . losartan  25 mg Oral Daily  . metoprolol succinate  100 mg Oral Daily  . simvastatin  40 mg Oral QPM    Assessment/ Plan:    1. CAD:  He has a tight proximal LCx stenosis and an ostial RCA stenosis.  Plan is for CABG this week.  2: HTN:  Currently well controlled.  3. Diabetes mellitus: 4.    Disposition:   For CABG next week.  Length of Stay: 1  Vesta Mixer, Montez Hageman., MD, [facility] 12/29/2012, 8:41 AM Office [phone] Pager [phone]

## 2012-12-29 NOTE — Progress Notes (Signed)
ANTICOAGULATION CONSULT NOTE   Pharmacy Consult for UFH Indication: ACS/CAD  Allergies  Allergen Reactions  . Adhesive (Tape)   . Lipitor (Atorvastatin) Other (See Comments)    myalgias    Patient Measurements: Height: 5' 10.87" (180 cm) Weight: 192 lb 0.3 oz (87.1 kg) IBW/kg (Calculated) : 74.99 Heparin Dosing Weight: 87kg  Vital Signs: Temp: 98.1 F (36.7 C) (05/17 1426) Temp src: Oral (05/17 1426) BP: 128/74 mmHg (05/17 1426) Pulse Rate: 85 (05/17 1426)  Labs:  Recent Labs  12/28/12 2243 12/29/12 0835 12/29/12 1500 12/29/12 1757  HGB 14.3 15.8  --   --   HCT 40.8 44.9  --   --   PLT 218 222  --   --   HEPARINUNFRC  --  0.17*  --  0.32  CREATININE 1.41*  --  1.32  --     Estimated Creatinine Clearance: 60.8 ml/min (by C-G formula based on Cr of 1.32).   Medical History: Past Medical History  Diagnosis Date  . Erectile dysfunction   . Hypertension   . Dyslipidemia   . Chronic kidney disease     kidney stones s/p lithotripsy  . Stroke     TIA's  . Cancer     mediastinal seminoma resected 1985 - benign  . Chest pain   . Shortness of breath   . Diabetes mellitus     neuropathy  insulin dependent  . GERD (gastroesophageal reflux disease)   . NSTEMI (non-ST elevated myocardial infarction)   . Coronary artery disease 02/2012    60% mid RCA, 80% prox left circ, 99% distal left circ, normal LAD s/p PCI left circ/OM    Medications:  Prescriptions prior to admission  Medication Sig Dispense Refill  . amitriptyline (ELAVIL) 100 MG tablet Take 100 mg by mouth at bedtime.      . clopidogrel (PLAVIX) 75 MG tablet Take 75 mg by mouth daily.      . insulin aspart (NOVOLOG) 100 UNIT/ML injection Inject 6-14 Units into the skin 3 (three) times daily before meals. Patient's dose is done on a sliding scale. Usually takes 8 units, but dose varies depending on glucose level.      . insulin glargine (LANTUS) 100 UNIT/ML injection Inject 40 Units into the skin at  bedtime.      . iron polysaccharides (NIFEREX) 150 MG capsule Take 300 mg by mouth daily.       . isosorbide mononitrate (IMDUR) 30 MG 24 hr tablet Take 60 mg by mouth daily.      Marland Kitchen losartan (COZAAR) 25 MG tablet Take 25 mg by mouth daily.      . metFORMIN (GLUCOPHAGE) 500 MG tablet Take 1,000 mg by mouth 2 (two) times daily with a meal.      . metoprolol succinate (TOPROL-XL) 50 MG 24 hr tablet Take 100 mg by mouth daily. Take with or immediately following a meal.      . Multiple Vitamin (MULTIVITAMIN) tablet Take 1 tablet by mouth daily.      . nitroGLYCERIN (NITROSTAT) 0.4 MG SL tablet Place 0.4 mg under the tongue every 5 (five) minutes as needed for chest pain.      . Omega-3 Fatty Acids (FISH OIL MAXIMUM STRENGTH) 1200 MG CAPS Take 3,600 mg by mouth 2 (two) times daily.      Marland Kitchen omeprazole (PRILOSEC) 20 MG capsule Take 20 mg by mouth daily as needed (heartburn).       . simvastatin (ZOCOR) 40 MG tablet Take 40  mg by mouth every evening.      . zolpidem (AMBIEN) 10 MG tablet Take 10 mg by mouth at bedtime as needed for sleep.       . [DISCONTINUED] metFORMIN (GLUCOPHAGE) 500 MG tablet Take 2 tablets (1,000 mg total) by mouth 2 (two) times daily with a meal. Do not restart Metformin until 7/26      . [DISCONTINUED] nitroGLYCERIN (NITROSTAT) 0.4 MG SL tablet Place 1 tablet (0.4 mg total) under the tongue every 5 (five) minutes x 3 doses as needed for chest pain.  30 tablet  5    Assessment: 62 y/o male patient admitted s/p cardiac cath, found to have severe 3 vessel CAD requiring CVTS consult for possible OHS. Heparin level = 0.32  Goal of Therapy:  Heparin level 0.3-0.7 units/ml Monitor platelets by anticoagulation protocol: Yes   Plan:  Increase heparin to 1500 units/hr F/u with heparin level in am

## 2012-12-29 NOTE — Progress Notes (Signed)
ANTICOAGULATION CONSULT NOTE - Initial Consult  Pharmacy Consult for UFH Indication: ACS/CAD  Allergies  Allergen Reactions  . Adhesive (Tape)   . Lipitor (Atorvastatin) Other (See Comments)    myalgias    Patient Measurements: Height: 5' 10.87" (180 cm) Weight: 192 lb 0.3 oz (87.1 kg) IBW/kg (Calculated) : 74.99 Heparin Dosing Weight: 87kg  Vital Signs: Temp: 98 F (36.7 C) (05/17 0400) Temp src: Oral (05/17 0400) BP: 110/72 mmHg (05/17 0948) Pulse Rate: 91 (05/17 0948)  Labs:  Recent Labs  12/28/12 2243 12/29/12 0835  HGB 14.3 15.8  HCT 40.8 44.9  PLT 218 222  HEPARINUNFRC  --  0.17*  CREATININE 1.41*  --     Estimated Creatinine Clearance: 56.9 ml/min (by C-G formula based on Cr of 1.41).   Medical History: Past Medical History  Diagnosis Date  . Erectile dysfunction   . Hypertension   . Dyslipidemia   . Chronic kidney disease     kidney stones s/p lithotripsy  . Stroke     TIA's  . Cancer     mediastinal seminoma resected 1985 - benign  . Chest pain   . Shortness of breath   . Diabetes mellitus     neuropathy  insulin dependent  . GERD (gastroesophageal reflux disease)   . NSTEMI (non-ST elevated myocardial infarction)   . Coronary artery disease 02/2012    60% mid RCA, 80% prox left circ, 99% distal left circ, normal LAD s/p PCI left circ/OM    Medications:  Prescriptions prior to admission  Medication Sig Dispense Refill  . amitriptyline (ELAVIL) 100 MG tablet Take 100 mg by mouth at bedtime.      . clopidogrel (PLAVIX) 75 MG tablet Take 75 mg by mouth daily.      . insulin aspart (NOVOLOG) 100 UNIT/ML injection Inject 6-14 Units into the skin 3 (three) times daily before meals. Patient's dose is done on a sliding scale. Usually takes 8 units, but dose varies depending on glucose level.      . insulin glargine (LANTUS) 100 UNIT/ML injection Inject 40 Units into the skin at bedtime.      . iron polysaccharides (NIFEREX) 150 MG capsule Take 300  mg by mouth daily.       . isosorbide mononitrate (IMDUR) 30 MG 24 hr tablet Take 60 mg by mouth daily.      Marland Kitchen losartan (COZAAR) 25 MG tablet Take 25 mg by mouth daily.      . metFORMIN (GLUCOPHAGE) 500 MG tablet Take 1,000 mg by mouth 2 (two) times daily with a meal.      . metoprolol succinate (TOPROL-XL) 50 MG 24 hr tablet Take 100 mg by mouth daily. Take with or immediately following a meal.      . Multiple Vitamin (MULTIVITAMIN) tablet Take 1 tablet by mouth daily.      . nitroGLYCERIN (NITROSTAT) 0.4 MG SL tablet Place 0.4 mg under the tongue every 5 (five) minutes as needed for chest pain.      . Omega-3 Fatty Acids (FISH OIL MAXIMUM STRENGTH) 1200 MG CAPS Take 3,600 mg by mouth 2 (two) times daily.      Marland Kitchen omeprazole (PRILOSEC) 20 MG capsule Take 20 mg by mouth daily as needed (heartburn).       . simvastatin (ZOCOR) 40 MG tablet Take 40 mg by mouth every evening.      . zolpidem (AMBIEN) 10 MG tablet Take 10 mg by mouth at bedtime as needed for sleep.       . [  DISCONTINUED] metFORMIN (GLUCOPHAGE) 500 MG tablet Take 2 tablets (1,000 mg total) by mouth 2 (two) times daily with a meal. Do not restart Metformin until 7/26      . [DISCONTINUED] nitroGLYCERIN (NITROSTAT) 0.4 MG SL tablet Place 1 tablet (0.4 mg total) under the tongue every 5 (five) minutes x 3 doses as needed for chest pain.  30 tablet  5    Assessment: 62 y/o male patient admitted s/p cardiac cath, found to have severe 3 vessel CAD requiring CVTS consult for possible OHS. Heparin level = 0.17  Goal of Therapy:  Heparin level 0.3-0.7 units/ml Monitor platelets by anticoagulation protocol: Yes   Plan:  Increase heparin to 1400 units/hr F/u with heparin level

## 2012-12-29 NOTE — Progress Notes (Signed)
  Echocardiogram 2D Echocardiogram has been performed.  [name] [name] 12/29/2012, 5:25 PM 

## 2012-12-29 NOTE — Progress Notes (Addendum)
Pre-op Cardiac Surgery  Carotid Findings:  No ICA stenosis.  Vertebral artery flow is antegrade.  Upper Extremity Right Left  Brachial Pressures 117 Triphasic 111 Triphasic  Radial Waveforms Triphasic Triphasic  Ulnar Waveforms Triphasic Biphasic  Palmar Arch (Allen's Test) Normal Normal   Findings:  Palmar arch evaluation - Doppler waveforms remained normal bilaterally with both radial and ulnar compressions.    Lower  Extremity Right Left      Anterior Tibial 149 Biphasic 149 Monophasic  Posterior Tibial > 300 Monophasic >300 Monophasic  Ankle/Brachial Indices      Findings:  ABIs not ascertained due to non compressible vessels probably secondary to calcification    [place], [place], RVS 01/30/2013 13:45

## 2012-12-29 NOTE — Progress Notes (Signed)
15:36-15:45   Follow up pre op visit Phase 1 Cardiac Rehab.  Family visiting with patient.  He has been walking independently.  Seems somewhat apprehensive about upcoming surgery.  Reassured, encouraged and supported. Cathie Olden RN

## 2012-12-30 DIAGNOSIS — Z01810 Encounter for preprocedural cardiovascular examination: Secondary | ICD-10-CM

## 2012-12-30 LAB — BASIC METABOLIC PANEL
BUN: 19 mg/dL (ref 6–23)
Creatinine, Ser: 1.41 mg/dL — ABNORMAL HIGH (ref 0.50–1.35)
GFR calc non Af Amer: 52 mL/min — ABNORMAL LOW (ref >90)
Glucose, Bld: 215 mg/dL — ABNORMAL HIGH (ref 70–99)
Potassium: 3.9 mEq/L (ref 3.5–5.1)

## 2012-12-30 LAB — GLUCOSE, CAPILLARY
Glucose-Capillary: 213 mg/dL — ABNORMAL HIGH (ref 70–99)
Glucose-Capillary: 276 mg/dL — ABNORMAL HIGH (ref 70–99)

## 2012-12-30 LAB — CBC
HCT: 43.1 % (ref 39.0–52.0)
MCH: 25.6 pg — ABNORMAL LOW (ref 26.0–34.0)
MCHC: 34.3 g/dL (ref 30.0–36.0)
MCV: 74.4 fL — ABNORMAL LOW (ref 78.0–100.0)
RDW: 18.5 % — ABNORMAL HIGH (ref 11.5–15.5)
WBC: 4.5 10*3/uL (ref 4.0–10.5)

## 2012-12-30 NOTE — Progress Notes (Signed)
ANTICOAGULATION CONSULT NOTE   Pharmacy Consult for UFH Indication: ACS/CAD  Allergies  Allergen Reactions  . Adhesive (Tape)   . Lipitor (Atorvastatin) Other (See Comments)    myalgias    Patient Measurements: Height: 5' 10.87" (180 cm) Weight: 192 lb 0.3 oz (87.1 kg) IBW/kg (Calculated) : 74.99 Heparin Dosing Weight: 87kg  Vital Signs: Temp: 97.5 F (36.4 C) (05/18 0646) Temp src: Oral (05/18 0646) BP: 129/78 mmHg (05/18 0646) Pulse Rate: 80 (05/18 0646)  Labs:  Recent Labs  12/28/12 2243 12/29/12 0835 12/29/12 1500 12/29/12 1757 12/30/12 0445  HGB 14.3 15.8  --   --  14.8  HCT 40.8 44.9  --   --  43.1  PLT 218 222  --   --  208  HEPARINUNFRC  --  0.17*  --  0.32 0.51  CREATININE 1.41*  --  1.32  --  1.41*    Estimated Creatinine Clearance: 56.9 ml/min (by C-G formula based on Cr of 1.41).   Medical History: Past Medical History  Diagnosis Date  . Erectile dysfunction   . Hypertension   . Dyslipidemia   . Chronic kidney disease     kidney stones s/p lithotripsy  . Stroke     TIA's  . Cancer     mediastinal seminoma resected 1985 - benign  . Chest pain   . Shortness of breath   . Diabetes mellitus     neuropathy  insulin dependent  . GERD (gastroesophageal reflux disease)   . NSTEMI (non-ST elevated myocardial infarction)   . Coronary artery disease 02/2012    60% mid RCA, 80% prox left circ, 99% distal left circ, normal LAD s/p PCI left circ/OM    Medications:  Prescriptions prior to admission  Medication Sig Dispense Refill  . amitriptyline (ELAVIL) 100 MG tablet Take 100 mg by mouth at bedtime.      . clopidogrel (PLAVIX) 75 MG tablet Take 75 mg by mouth daily.      . insulin aspart (NOVOLOG) 100 UNIT/ML injection Inject 6-14 Units into the skin 3 (three) times daily before meals. Patient's dose is done on a sliding scale. Usually takes 8 units, but dose varies depending on glucose level.      . insulin glargine (LANTUS) 100 UNIT/ML injection  Inject 40 Units into the skin at bedtime.      . iron polysaccharides (NIFEREX) 150 MG capsule Take 300 mg by mouth daily.       . isosorbide mononitrate (IMDUR) 30 MG 24 hr tablet Take 60 mg by mouth daily.      Marland Kitchen losartan (COZAAR) 25 MG tablet Take 25 mg by mouth daily.      . metFORMIN (GLUCOPHAGE) 500 MG tablet Take 1,000 mg by mouth 2 (two) times daily with a meal.      . metoprolol succinate (TOPROL-XL) 50 MG 24 hr tablet Take 100 mg by mouth daily. Take with or immediately following a meal.      . Multiple Vitamin (MULTIVITAMIN) tablet Take 1 tablet by mouth daily.      . nitroGLYCERIN (NITROSTAT) 0.4 MG SL tablet Place 0.4 mg under the tongue every 5 (five) minutes as needed for chest pain.      . Omega-3 Fatty Acids (FISH OIL MAXIMUM STRENGTH) 1200 MG CAPS Take 3,600 mg by mouth 2 (two) times daily.      Marland Kitchen omeprazole (PRILOSEC) 20 MG capsule Take 20 mg by mouth daily as needed (heartburn).       Marland Kitchen  simvastatin (ZOCOR) 40 MG tablet Take 40 mg by mouth every evening.      . zolpidem (AMBIEN) 10 MG tablet Take 10 mg by mouth at bedtime as needed for sleep.       . [DISCONTINUED] metFORMIN (GLUCOPHAGE) 500 MG tablet Take 2 tablets (1,000 mg total) by mouth 2 (two) times daily with a meal. Do not restart Metformin until 7/26      . [DISCONTINUED] nitroGLYCERIN (NITROSTAT) 0.4 MG SL tablet Place 1 tablet (0.4 mg total) under the tongue every 5 (five) minutes x 3 doses as needed for chest pain.  30 tablet  5    Assessment: 62 y/o male patient admitted s/p cardiac cath, found to have severe 3 vessel CAD requiring CVTS. Heparin level = 0.51. Plan for CABG later next week.   Goal of Therapy:  Heparin level 0.3-0.7 units/ml Monitor platelets by anticoagulation protocol: Yes   Plan:  Increase heparin to 1500 units/hr F/u with heparin level

## 2012-12-30 NOTE — Progress Notes (Signed)
PROGRESS NOTE  Subjective:   Mr. Elizebeth Brooking is a 62 yo with CAD - stenosis of prox LCx and ostial RCA.  The plan is for CABG.   He is pain free.   Objective:    Vital Signs:   Temp:  [97.5 F (36.4 C)-98.1 F (36.7 C)] 97.5 F (36.4 C) (05/18 0646) Pulse Rate:  [80-91] 80 (05/18 0646) Resp:  [18] 18 (05/18 0646) BP: (110-129)/(72-78) 129/78 mmHg (05/18 0646) SpO2:  [97 %-98 %] 97 % (05/18 0646)  Last BM Date: 12/29/12   24-hour weight change: Weight change:   Weight trends: Filed Weights   12/28/12 1640  Weight: 192 lb 0.3 oz (87.1 kg)    Intake/Output:  05/17 0701 - 05/18 0700 In: 909.9 [P.O.:680; I.V.:229.9] Out: -      Physical Exam: BP 129/78  Pulse 80  Temp(Src) 97.5 F (36.4 C) (Oral)  Resp 18  Ht 5' 10.87" (1.8 m)  Wt 192 lb 0.3 oz (87.1 kg)  BMI 26.88 kg/m2  SpO2 97%  General: Vital signs reviewed and noted.   Head: Normocephalic, atraumatic.  Eyes: conjunctivae/corneas clear.  EOM's intact.   Throat: normal  Neck:  normal  Lungs:  clear  Heart:  RR, normal S1, S2  Abdomen:  Soft, non-tender, non-distended    Extremities: No edema, cath site is ok   Neurologic: A&O X3, CN II - XII are grossly intact.   Psych: Normal     Labs: BMET:  Recent Labs  12/28/12 2243 12/29/12 1500 12/30/12 0445  NA 140 139 141  K 4.2 4.0 3.9  CL 106 104 107  CO2 26 22 26   GLUCOSE 207* 278* 215*  BUN 19 18 19   CREATININE 1.41* 1.32 1.41*  CALCIUM 9.0 9.2 9.0  MG 2.1  --   --     Liver function tests:  Recent Labs  12/28/12 2243  AST 16  ALT 28  ALKPHOS 76  BILITOT 0.3  PROT 6.5  ALBUMIN 3.6    CBC:  Recent Labs  12/28/12 2243 12/29/12 0835 12/30/12 0445  WBC 3.7* 3.8* 4.5  NEUTROABS 1.4*  --   --   HGB 14.3 15.8 14.8  HCT 40.8 44.9 43.1  MCV 74.3* 74.3* 74.4*  PLT 218 222 208     Recent Labs  12/28/12 2243  HGBA1C 9.1*   No results found for this basename: CHOL, HDL, LDLCALC, TRIG, CHOLHDL,  in the last 72 hours  Recent  Labs  12/28/12 2243  TSH 7.486*    Other results:  Tele:  NSR  Medications:    Infusions: . heparin 1,500 Units/hr (12/29/12 1943)  . nitroGLYCERIN      Scheduled Medications: . amitriptyline  100 mg Oral QHS  . aspirin EC  81 mg Oral Daily  . docusate sodium  100 mg Oral Daily  . insulin aspart  0-15 Units Subcutaneous TID WC  . insulin aspart  0-5 Units Subcutaneous QHS  . insulin glargine  40 Units Subcutaneous QHS  . iron polysaccharides  300 mg Oral Daily  . losartan  25 mg Oral Daily  . metoprolol succinate  100 mg Oral Daily  . simvastatin  40 mg Oral QPM    Assessment/ Plan:    1. CAD:  He has a tight proximal LCx stenosis and an ostial RCA stenosis.  Plan is for CABG this week - Thursday or Friday ( he was on Plavix and we need to allow Plavix to wash out)  2: HTN:  Currently well controlled.  3. Diabetes mellitus: 4.   Disposition:   For CABG next week.  Length of Stay: 2  Vesta Mixer, Montez Hageman., MD, [facility] 12/30/2012, 7:56 AM Office [phone] Pager [phone]

## 2012-12-31 ENCOUNTER — Inpatient Hospital Stay (HOSPITAL_COMMUNITY): Payer: 59

## 2012-12-31 LAB — PULMONARY FUNCTION TEST

## 2012-12-31 LAB — BASIC METABOLIC PANEL
GFR calc Af Amer: 73 mL/min — ABNORMAL LOW (ref >90)
GFR calc non Af Amer: 63 mL/min — ABNORMAL LOW (ref >90)
Glucose, Bld: 169 mg/dL — ABNORMAL HIGH (ref 70–99)
Potassium: 4 mEq/L (ref 3.5–5.1)
Sodium: 138 mEq/L (ref 135–145)

## 2012-12-31 LAB — CBC
HCT: 43.2 % (ref 39.0–52.0)
Platelets: 202 10*3/uL (ref 150–400)
RDW: 19 % — ABNORMAL HIGH (ref 11.5–15.5)
WBC: 3.9 10*3/uL — ABNORMAL LOW (ref 4.0–10.5)

## 2012-12-31 LAB — GLUCOSE, CAPILLARY: Glucose-Capillary: 179 mg/dL — ABNORMAL HIGH (ref 70–99)

## 2012-12-31 MED ORDER — NITROGLYCERIN 0.4 MG SL SUBL: 0.4000 mg | SUBLINGUAL_TABLET | SUBLINGUAL | Status: DC | PRN

## 2012-12-31 MED ORDER — ALBUTEROL SULFATE (5 MG/ML) 0.5% IN NEBU
2.5000 mg | INHALATION_SOLUTION | Freq: Once | RESPIRATORY_TRACT | Status: AC
  Administered 2012-12-31: 2.5 mg via RESPIRATORY_TRACT

## 2012-12-31 MED ORDER — POLYETHYLENE GLYCOL 3350 17 G PO PACK
17.0000 g | PACK | Freq: Every day | ORAL | Status: DC | PRN
  Administered 2012-12-31 – 2013-01-02 (×3): 17 g via ORAL
  Filled 2012-12-31 (×3): qty 1

## 2012-12-31 MED ORDER — IOHEXOL 300 MG/ML  SOLN
80.0000 mL | Freq: Once | INTRAMUSCULAR | Status: AC | PRN
  Administered 2012-12-31: 80 mL via INTRAVENOUS

## 2012-12-31 MED ORDER — MAGNESIUM HYDROXIDE 400 MG/5ML PO SUSP
30.0000 mL | Freq: Every day | ORAL | Status: DC | PRN
  Administered 2012-12-31 – 2013-01-03 (×2): 30 mL via ORAL
  Filled 2012-12-31 (×2): qty 30

## 2012-12-31 NOTE — Progress Notes (Signed)
SUBJECTIVE:  Doing well no complaints  OBJECTIVE:   Vitals:   Filed Vitals:   12/30/12 0646 12/30/12 1311 12/30/12 2100 12/31/12 0500  BP: 129/78 110/77 123/71 127/71  Pulse: 80 79 79 77  Temp: 97.5 F (36.4 C) 98 F (36.7 C) 97.6 F (36.4 C) 97.6 F (36.4 C)  TempSrc: Oral Oral Oral   Resp: 18 20 18 18   Height:      Weight:      SpO2: 97% 99% 99% 95%   I&O's:   Intake/Output Summary (Last 24 hours) at 12/31/12 0845 Last data filed at 12/30/12 2300  Gross per 24 hour  Intake    913 ml  Output      0 ml  Net    913 ml   TELEMETRY: Reviewed telemetry pt in NSR:     PHYSICAL EXAM General: Well developed, well nourished, in no acute distress Head: Eyes PERRLA, No xanthomas.   Normal cephalic and atramatic  Lungs:   Clear bilaterally to auscultation and percussion. Heart:   HRRR S1 S2 Pulses are 2+ & equal. Abdomen: Bowel sounds are positive, abdomen soft and non-tender without masses Extremities:   No clubbing, cyanosis or edema.  DP +1 Neuro: Alert and oriented X 3. Psych:  Good affect, responds appropriately   LABS: Basic Metabolic Panel:  Recent Labs  60/45/40 2243  12/30/12 0445 12/31/12 0520  NA 140  < > 141 138  K 4.2  < > 3.9 4.0  CL 106  < > 107 106  CO2 26  < > 26 23  GLUCOSE 207*  < > 215* 169*  BUN 19  < > 19 15  CREATININE 1.41*  < > 1.41* 1.20  CALCIUM 9.0  < > 9.0 9.1  MG 2.1  --   --   --   < > = values in this interval not displayed. Liver Function Tests:  Recent Labs  12/28/12 2243  AST 16  ALT 28  ALKPHOS 76  BILITOT 0.3  PROT 6.5  ALBUMIN 3.6   No results found for this basename: LIPASE, AMYLASE,  in the last 72 hours CBC:  Recent Labs  12/28/12 2243  12/30/12 0445 12/31/12 0520  WBC 3.7*  < > 4.5 3.9*  NEUTROABS 1.4*  --   --   --   HGB 14.3  < > 14.8 14.6  HCT 40.8  < > 43.1 43.2  MCV 74.3*  < > 74.4* 74.7*  PLT 218  < > 208 202  < > = values in this interval not displayed. Cardiac Enzymes: No results found for  this basename: CKTOTAL, CKMB, CKMBINDEX, TROPONINI,  in the last 72 hours BNP: No components found with this basename: POCBNP,  D-Dimer: No results found for this basename: DDIMER,  in the last 72 hours Hemoglobin A1C:  Recent Labs  12/28/12 2243  HGBA1C 9.1*   Fasting Lipid Panel: No results found for this basename: CHOL, HDL, LDLCALC, TRIG, CHOLHDL, LDLDIRECT,  in the last 72 hours Thyroid Function Tests:  Recent Labs  12/28/12 2243  TSH 7.486*   Anemia Panel: No results found for this basename: VITAMINB12, FOLATE, FERRITIN, TIBC, IRON, RETICCTPCT,  in the last 72 hours Coag Panel:   Lab Results  Component Value Date   INR 0.93 05/16/2012   INR 0.99 03/06/2012    RADIOLOGY: No results found.  Assessment/ Plan:   1. CAD: He has a tight proximal LCx stenosis and an ostial RCA stenosis. Plan is for  CABG this week - Thursday or Friday ( he was on Plavix and we need to allow Plavix to wash out)  2: HTN: Currently well controlled.  3. Diabetes mellitus:     Quintella Reichert, MD  12/31/2012  8:45 AM

## 2012-12-31 NOTE — Progress Notes (Signed)
ANTICOAGULATION CONSULT NOTE   Pharmacy Consult for Heparin Indication: ACS/CAD  Allergies  Allergen Reactions  . Adhesive (Tape)   . Lipitor (Atorvastatin) Other (See Comments)    myalgias    Patient Measurements: Height: 5' 10.87" (180 cm) Weight: 192 lb 0.3 oz (87.1 kg) IBW/kg (Calculated) : 74.99 Heparin Dosing Weight: 87kg  Vital Signs: Temp: 97.6 F (36.4 C) (05/19 0500) BP: 112/73 mmHg (05/19 0945) Pulse Rate: 89 (05/19 0945)  Labs:  Recent Labs  12/29/12 0835 12/29/12 1500 12/29/12 1757 12/30/12 0445 12/31/12 0520  HGB 15.8  --   --  14.8 14.6  HCT 44.9  --   --  43.1 43.2  PLT 222  --   --  208 202  HEPARINUNFRC 0.17*  --  0.32 0.51 0.53  CREATININE  --  1.32  --  1.41* 1.20    Estimated Creatinine Clearance: 66.8 ml/min (by C-G formula based on Cr of 1.2).   Medical History: Past Medical History  Diagnosis Date  . Erectile dysfunction   . Hypertension   . Dyslipidemia   . Chronic kidney disease     kidney stones s/p lithotripsy  . Stroke     TIA's  . Cancer     mediastinal seminoma resected 1985 - benign  . Chest pain   . Shortness of breath   . Diabetes mellitus     neuropathy  insulin dependent  . GERD (gastroesophageal reflux disease)   . NSTEMI (non-ST elevated myocardial infarction)   . Coronary artery disease 02/2012    60% mid RCA, 80% prox left circ, 99% distal left circ, normal LAD s/p PCI left circ/OM    Medications:  Prescriptions prior to admission  Medication Sig Dispense Refill  . amitriptyline (ELAVIL) 100 MG tablet Take 100 mg by mouth at bedtime.      . clopidogrel (PLAVIX) 75 MG tablet Take 75 mg by mouth daily.      . insulin aspart (NOVOLOG) 100 UNIT/ML injection Inject 6-14 Units into the skin 3 (three) times daily before meals. Patient's dose is done on a sliding scale. Usually takes 8 units, but dose varies depending on glucose level.      . insulin glargine (LANTUS) 100 UNIT/ML injection Inject 40 Units into the  skin at bedtime.      . iron polysaccharides (NIFEREX) 150 MG capsule Take 300 mg by mouth daily.       . isosorbide mononitrate (IMDUR) 30 MG 24 hr tablet Take 60 mg by mouth daily.      Marland Kitchen losartan (COZAAR) 25 MG tablet Take 25 mg by mouth daily.      . metFORMIN (GLUCOPHAGE) 500 MG tablet Take 1,000 mg by mouth 2 (two) times daily with a meal.      . metoprolol succinate (TOPROL-XL) 50 MG 24 hr tablet Take 100 mg by mouth daily. Take with or immediately following a meal.      . Multiple Vitamin (MULTIVITAMIN) tablet Take 1 tablet by mouth daily.      . nitroGLYCERIN (NITROSTAT) 0.4 MG SL tablet Place 0.4 mg under the tongue every 5 (five) minutes as needed for chest pain.      . Omega-3 Fatty Acids (FISH OIL MAXIMUM STRENGTH) 1200 MG CAPS Take 3,600 mg by mouth 2 (two) times daily.      Marland Kitchen omeprazole (PRILOSEC) 20 MG capsule Take 20 mg by mouth daily as needed (heartburn).       . simvastatin (ZOCOR) 40 MG tablet Take 40  mg by mouth every evening.      . zolpidem (AMBIEN) 10 MG tablet Take 10 mg by mouth at bedtime as needed for sleep.       . [DISCONTINUED] metFORMIN (GLUCOPHAGE) 500 MG tablet Take 2 tablets (1,000 mg total) by mouth 2 (two) times daily with a meal. Do not restart Metformin until 7/26      . [DISCONTINUED] nitroGLYCERIN (NITROSTAT) 0.4 MG SL tablet Place 1 tablet (0.4 mg total) under the tongue every 5 (five) minutes x 3 doses as needed for chest pain.  30 tablet  5    Assessment: 62 y/o male patient admitted with CP.  Now s/p cardiac cath with severe 3 vessel CAD requiring CVTS. Heparin level = 0.53. Plan for CABG later this week after Plavix washout.   Goal of Therapy:  Heparin level 0.3-0.7 units/ml Monitor platelets by anticoagulation protocol: Yes   Plan:  Continue heparin to 1500 units/hr F/u with heparin level   [employer], Pharm.D., BCPS Clinical Pharmacist Pager [phone] 12/31/2012 10:40 AM

## 2012-12-31 NOTE — Progress Notes (Signed)
Inpatient Diabetes Program Recommendations  AACE/ADA: New Consensus Statement on Inpatient Glycemic Control (2013)  Target Ranges:  Prepandial:   less than 140 mg/dL      Peak postprandial:   less than 180 mg/dL (1-2 hours)      Critically ill patients:  140 - 180 mg/dL   Results for [name], [name] (MRN [id number]) as of 12/31/2012 11:00  Ref. Range 12/30/2012 07:44 12/30/2012 11:45 12/30/2012 16:22 12/30/2012 21:09 12/31/2012 09:48  Glucose-Capillary Latest Range: 70-99 mg/dL 956 (H) 213 (H) 086 (H) 189 (H) 198 (H)    Inpatient Diabetes Program Recommendations Insulin - Basal: Please consider increasing Lantus to 45 units QHS. Correction (SSI): Please consider increasing Novolog correction to Resistant scale.  Note: Patient has a history of diabetes and takes Lantus 40 units QHS, Novolog 6-14 units TID, and Metformin 1000 mg BID at home for diabetes management.  Currently, patient is ordered to receive Lantus 40 units QHS, Novolog 0-15 units AC, and Novolog 0-5 units QHS for inpatient glycemic control.  Blood glucose over the past 24 hours has ranged from 189-276 mg/dl and fasting blood glucose this morning noted to be 198 mg/dl.  Please consider increasing Lantus to 45 units QHS and increasing Novolog correction to resistant scale.  Thanks, Orlando Penner, RN, MSN, CCRN Diabetes Coordinator Inpatient Diabetes Program [phone]

## 2012-12-31 NOTE — H&P (Signed)
Admit date: 12/28/2012 Referring Physician Dr. Docia Chuck Primary Cardiologist Dr. Armanda Magic Chief complaint/reason for admission: Unstable angina with progression of CAD  HPI: The patient presents today heart cath for further workup of his SOB, CAD and HTN. He recently saw Dr. Katrinka Blazing for an episode of chest pain and since then has had an episode of severe SOB while on the treadmill at cardiac rehab. He has a history of iron deficiency anemia and was seen by Dr. Randa Evens who felt anemia was not the etiology of his SOB. His last Hgb was 10 and he does not think his SOB is due to anemia. He has continued to have chest pain which only occurs with exertion. He is very active and has gone from jogging on the treadmill to now he cannot even walk on the treadmill due to chest pain and SOB. Today he underwent cardiac cath showing severe 2 vessel ASCAD with subtotaled ostial RCA and restenosis of left circ stent.  After review of films with Dr. Eldridge Dace, it was felt that PCI would be high risk due to severe calcification of the left system.  He is now admitted for IV Heparin therapy and IV NTG gtt while plavix washes out in preparation for CABG next week.   PMH:    Past Medical History  Diagnosis Date  . Erectile dysfunction   . Hypertension   . Dyslipidemia   . Chronic kidney disease     kidney stones s/p lithotripsy  . Stroke     TIA's  . Cancer     mediastinal seminoma resected 1985 - benign  . Chest pain   . Shortness of breath   . Diabetes mellitus     neuropathy  insulin dependent  . GERD (gastroesophageal reflux disease)   . NSTEMI (non-ST elevated myocardial infarction)   . Coronary artery disease 02/2012    60% mid RCA, 80% prox left circ, 99% distal left circ, normal LAD s/p PCI left circ/OM    PSH:    Past Surgical History  Procedure Laterality Date  . Resection mediastinal seminonma    . Lithotripsy    . Esophagogastroduodenoscopy  07/20/2012    Procedure: ESOPHAGOGASTRODUODENOSCOPY  (EGD);  Surgeon: Vertell Novak., MD;  Location: [facility] ENDOSCOPY;  Service: Endoscopy;  Laterality: N/A;  . Colonoscopy  07/25/2012    Procedure: COLONOSCOPY;  Surgeon: Vertell Novak., MD;  Location: [facility] ENDOSCOPY;  Service: Endoscopy;  Laterality: N/A;    ALLERGIES:   Adhesive and Lipitor  Prior to Admit Meds:   Prescriptions prior to admission  Medication Sig Dispense Refill  . amitriptyline (ELAVIL) 100 MG tablet Take 100 mg by mouth at bedtime.      . clopidogrel (PLAVIX) 75 MG tablet Take 75 mg by mouth daily.      . insulin aspart (NOVOLOG) 100 UNIT/ML injection Inject 6-14 Units into the skin 3 (three) times daily before meals. Patient's dose is done on a sliding scale. Usually takes 8 units, but dose varies depending on glucose level.      . insulin glargine (LANTUS) 100 UNIT/ML injection Inject 40 Units into the skin at bedtime.      . iron polysaccharides (NIFEREX) 150 MG capsule Take 300 mg by mouth daily.       . isosorbide mononitrate (IMDUR) 30 MG 24 hr tablet Take 60 mg by mouth daily.      Marland Kitchen losartan (COZAAR) 25 MG tablet Take 25 mg by mouth daily.      Marland Kitchen  metFORMIN (GLUCOPHAGE) 500 MG tablet Take 1,000 mg by mouth 2 (two) times daily with a meal.      . metoprolol succinate (TOPROL-XL) 50 MG 24 hr tablet Take 100 mg by mouth daily. Take with or immediately following a meal.      . Multiple Vitamin (MULTIVITAMIN) tablet Take 1 tablet by mouth daily.      . nitroGLYCERIN (NITROSTAT) 0.4 MG SL tablet Place 0.4 mg under the tongue every 5 (five) minutes as needed for chest pain.      . Omega-3 Fatty Acids (FISH OIL MAXIMUM STRENGTH) 1200 MG CAPS Take 3,600 mg by mouth 2 (two) times daily.      Marland Kitchen omeprazole (PRILOSEC) 20 MG capsule Take 20 mg by mouth daily as needed (heartburn).       . simvastatin (ZOCOR) 40 MG tablet Take 40 mg by mouth every evening.      . zolpidem (AMBIEN) 10 MG tablet Take 10 mg by mouth at bedtime as needed for sleep.       . [DISCONTINUED] metFORMIN  (GLUCOPHAGE) 500 MG tablet Take 2 tablets (1,000 mg total) by mouth 2 (two) times daily with a meal. Do not restart Metformin until 7/26      . [DISCONTINUED] nitroGLYCERIN (NITROSTAT) 0.4 MG SL tablet Place 1 tablet (0.4 mg total) under the tongue every 5 (five) minutes x 3 doses as needed for chest pain.  30 tablet  5   Family HX:    Family History  Problem Relation Age of Onset  . Diabetes Father   . Diabetes Mother   . Heart failure Mother   . Hypertension Mother   . Cancer Brother   . Diabetes Sister    Social HX:    History   Social History  . Marital Status: Married    Spouse Name: N/A    Number of Children: N/A  . Years of Education: N/A   Occupational History  . Not on file.   Social History Main Topics  . Smoking status: Former Smoker    Quit date: 03/06/1985  . Smokeless tobacco: Never Used  . Alcohol Use: No  . Drug Use: No  . Sexually Active: Not Currently   Other Topics Concern  . Not on file   Social History Narrative  . No narrative on file     ROS:  All 11 ROS were addressed and are negative except what is stated in the HPI  PHYSICAL EXAM Filed Vitals:   12/31/12 0945  BP: 112/73  Pulse: 89  Temp:   Resp:    General: Well developed, well nourished, in no acute distress Head: Eyes PERRLA, No xanthomas.   Normal cephalic and atramatic  Lungs:   Clear bilaterally to auscultation and percussion. Heart:   HRRR S1 S2 Pulses are 2+ & equal.            No carotid bruit. No JVD.  No abdominal bruits. No femoral bruits. Abdomen: Bowel sounds are positive, abdomen soft and non-tender without masses  Extremities:   No clubbing, cyanosis or edema.  DP +1 Neuro: Alert and oriented X 3. Psych:  Good affect, responds appropriately   Labs:   Lab Results  Component Value Date   WBC 3.9* 12/31/2012   HGB 14.6 12/31/2012   HCT 43.2 12/31/2012   MCV 74.7* 12/31/2012   PLT 202 12/31/2012    Recent Labs Lab 12/28/12 2243  12/31/12 0520  NA 140  < > 138   K 4.2  < >  4.0  CL 106  < > 106  CO2 26  < > 23  BUN 19  < > 15  CREATININE 1.41*  < > 1.20  CALCIUM 9.0  < > 9.1  PROT 6.5  --   --   BILITOT 0.3  --   --   ALKPHOS 76  --   --   ALT 28  --   --   AST 16  --   --   GLUCOSE 207*  < > 169*  < > = values in this interval not displayed. Lab Results  Component Value Date   CKTOTAL 198 03/07/2012   CKMB 6.2* 03/07/2012   TROPONINI 0.74* 03/07/2012   No results found for this basename: PTT   Lab Results  Component Value Date   INR 0.93 05/16/2012   INR 0.99 03/06/2012     ASSESSMENT:  1.  Severe 2 vessel ASCAD with ostial subtotalled RCA and instent restenosis of the left circ. 2.  Normal LVF 3.  Unstable angina secondary to #1 4.  HTN 5.  DM  PLAN:   1.  Admit to tele bed 2.  IV Heparin gtt 3.  IV NTG gtt 4.  Continue ASA and stop Plavix 5.  CVTS consult for CABG  Quintella Reichert, MD  12/31/2012  11:35 AM

## 2013-01-01 ENCOUNTER — Encounter (HOSPITAL_COMMUNITY): Payer: Self-pay

## 2013-01-01 LAB — CBC
Hemoglobin: 14.7 g/dL (ref 13.0–17.0)
MCH: 25.9 pg — ABNORMAL LOW (ref 26.0–34.0)
MCV: 74.5 fL — ABNORMAL LOW (ref 78.0–100.0)
RBC: 5.68 MIL/uL (ref 4.22–5.81)
WBC: 4.5 10*3/uL (ref 4.0–10.5)

## 2013-01-01 LAB — URINALYSIS, ROUTINE W REFLEX MICROSCOPIC
Bilirubin Urine: NEGATIVE
Glucose, UA: 500 mg/dL — AB
Hgb urine dipstick: NEGATIVE
Ketones, ur: NEGATIVE mg/dL
Leukocytes, UA: NEGATIVE
Nitrite: NEGATIVE
Protein, ur: NEGATIVE mg/dL
Specific Gravity, Urine: 1.019 (ref 1.005–1.030)
Urobilinogen, UA: 1 mg/dL (ref 0.0–1.0)
pH: 6 (ref 5.0–8.0)

## 2013-01-01 LAB — BASIC METABOLIC PANEL
BUN: 15 mg/dL (ref 6–23)
CO2: 26 mEq/L (ref 19–32)
Chloride: 108 mEq/L (ref 96–112)
Creatinine, Ser: 1.39 mg/dL — ABNORMAL HIGH (ref 0.50–1.35)
Potassium: 4.1 mEq/L (ref 3.5–5.1)

## 2013-01-01 LAB — HEPARIN LEVEL (UNFRACTIONATED): Heparin Unfractionated: 0.62 IU/mL (ref 0.30–0.70)

## 2013-01-01 LAB — GLUCOSE, CAPILLARY
Glucose-Capillary: 164 mg/dL — ABNORMAL HIGH (ref 70–99)
Glucose-Capillary: 234 mg/dL — ABNORMAL HIGH (ref 70–99)
Glucose-Capillary: 206 mg/dL — ABNORMAL HIGH (ref 70–99)

## 2013-01-01 NOTE — Progress Notes (Signed)
CARDIAC REHAB PHASE I   PRE:  Rate/Rhythm: 85SR  BP:  Supine:   Sitting: 90/60  Standing:    SaO2:   MODE:  Ambulation: 550 ft   POST:  Rate/Rhythm: 86SR  BP:  Supine:   Sitting: 106/70  Standing:    SaO2:  0900-0922 Pt walked 550 ft on RA with steady gait. Tolerated well. Denied CP. Pt states he has watched preop video and he has [facility] booklet. Demonstrated 2500 ml on IS. Discussed sternal precautions, correct way to get up and down after surgery without using arms, and importance of mobility after surgery. Pt voiced understanding. We will see after surgery as pt can walk independently and mobility ed done.    Luetta Nutting, RN BSN  01/01/2013 9:17 AM

## 2013-01-01 NOTE — Plan of Care (Signed)
Problem: Phase I Progression Outcomes Goal: Point person for discharge identified Outcome: Completed/Met Date Met:  01/01/13 Patients wife

## 2013-01-01 NOTE — Care Management Note (Unsigned)
    Page 1 of 1   01/09/2013     4:05:56 PM   CARE MANAGEMENT NOTE 01/09/2013  Patient:  [name], [name]   Account Number:  [id number]  Date Initiated:  01/01/2013  Documentation initiated by:  GRAVES-BIGELOW,BRENDA  Subjective/Objective Assessment:   Pt admitted for Unstable angina with progression of CAD. He is now admitted for IV Heparin therapy and IV NTG gtt while plavix washes out in preparation for CABG.     Action/Plan:   CM will continue to monitor for disposition needs.   Anticipated DC Date:  01/09/2013   Anticipated DC Plan:  HOME W HOME HEALTH SERVICES      DC Planning Services  CM consult      Choice offered to / List presented to:             Status of service:  In process, will continue to follow Medicare Important Message given?   (If response is "NO", the following Medicare IM given date fields will be blank) Date Medicare IM given:   Date Additional Medicare IM given:    Discharge Disposition:    Per UR Regulation:  Reviewed for med. necessity/level of care/duration of stay  If discussed at Long Length of Stay Meetings, dates discussed:    Comments:  01/09/13 Zenita Kister,RN,BSN [phone] PT S/P CABG X 2 ON 01/04/13.  PTA, PT RESIDES AT HOME WITH WIFE, WHO WILL PROVIDE CARE AT DC.

## 2013-01-01 NOTE — Progress Notes (Signed)
ANTICOAGULATION CONSULT NOTE   Pharmacy Consult for Heparin Indication: ACS/CAD  Allergies  Allergen Reactions  . Adhesive (Tape)   . Lipitor (Atorvastatin) Other (See Comments)    myalgias    Patient Measurements: Height: 5' 10.87" (180 cm) Weight: 192 lb 0.3 oz (87.1 kg) IBW/kg (Calculated) : 74.99 Heparin Dosing Weight: 87kg  Vital Signs: Temp: 97.8 F (36.6 C) (05/20 0500) BP: 104/68 mmHg (05/20 0500) Pulse Rate: 74 (05/20 0500)  Labs:  Recent Labs  12/30/12 0445 12/31/12 0520 01/01/13 0519  HGB 14.8 14.6 14.7  HCT 43.1 43.2 42.3  PLT 208 202 201  HEPARINUNFRC 0.51 0.53 0.62  CREATININE 1.41* 1.20 1.39*    Estimated Creatinine Clearance: 57.7 ml/min (by C-G formula based on Cr of 1.39).   Medical History: Past Medical History  Diagnosis Date  . Erectile dysfunction   . Hypertension   . Dyslipidemia   . Chronic kidney disease     kidney stones s/p lithotripsy  . Stroke     TIA's  . Cancer     mediastinal seminoma resected 1985 - benign  . Chest pain   . Shortness of breath   . Diabetes mellitus     neuropathy  insulin dependent  . GERD (gastroesophageal reflux disease)   . NSTEMI (non-ST elevated myocardial infarction)   . Coronary artery disease 02/2012    60% mid RCA, 80% prox left circ, 99% distal left circ, normal LAD s/p PCI left circ/OM    Medications:  Prescriptions prior to admission  Medication Sig Dispense Refill  . amitriptyline (ELAVIL) 100 MG tablet Take 100 mg by mouth at bedtime.      . clopidogrel (PLAVIX) 75 MG tablet Take 75 mg by mouth daily.      . insulin aspart (NOVOLOG) 100 UNIT/ML injection Inject 6-14 Units into the skin 3 (three) times daily before meals. Patient's dose is done on a sliding scale. Usually takes 8 units, but dose varies depending on glucose level.      . insulin glargine (LANTUS) 100 UNIT/ML injection Inject 40 Units into the skin at bedtime.      . iron polysaccharides (NIFEREX) 150 MG capsule Take 300  mg by mouth daily.       . isosorbide mononitrate (IMDUR) 30 MG 24 hr tablet Take 60 mg by mouth daily.      Marland Kitchen losartan (COZAAR) 25 MG tablet Take 25 mg by mouth daily.      . metFORMIN (GLUCOPHAGE) 500 MG tablet Take 1,000 mg by mouth 2 (two) times daily with a meal.      . metoprolol succinate (TOPROL-XL) 50 MG 24 hr tablet Take 100 mg by mouth daily. Take with or immediately following a meal.      . Multiple Vitamin (MULTIVITAMIN) tablet Take 1 tablet by mouth daily.      . nitroGLYCERIN (NITROSTAT) 0.4 MG SL tablet Place 0.4 mg under the tongue every 5 (five) minutes as needed for chest pain.      . Omega-3 Fatty Acids (FISH OIL MAXIMUM STRENGTH) 1200 MG CAPS Take 3,600 mg by mouth 2 (two) times daily.      Marland Kitchen omeprazole (PRILOSEC) 20 MG capsule Take 20 mg by mouth daily as needed (heartburn).       . simvastatin (ZOCOR) 40 MG tablet Take 40 mg by mouth every evening.      . zolpidem (AMBIEN) 10 MG tablet Take 10 mg by mouth at bedtime as needed for sleep.       . [  DISCONTINUED] metFORMIN (GLUCOPHAGE) 500 MG tablet Take 2 tablets (1,000 mg total) by mouth 2 (two) times daily with a meal. Do not restart Metformin until 7/26      . [DISCONTINUED] nitroGLYCERIN (NITROSTAT) 0.4 MG SL tablet Place 1 tablet (0.4 mg total) under the tongue every 5 (five) minutes x 3 doses as needed for chest pain.  30 tablet  5    Assessment: 62 y/o male patient admitted with CP.  Now s/p cardiac cath with severe 3 vessel CAD requiring CVTS. Heparin level = 0.62. Plan for CABG later this week after Plavix washout.   Goal of Therapy:  Heparin level 0.3-0.7 units/ml Monitor platelets by anticoagulation protocol: Yes   Plan:  Continue heparin to 1500 units/hr F/u with heparin level   [employer], Pharm.D., BCPS Clinical Pharmacist Pager [phone] 01/01/2013 8:50 AM

## 2013-01-01 NOTE — Progress Notes (Signed)
SUBJECTIVE:  Denies any chest pain or SOB overnight  OBJECTIVE:   Vitals:   Filed Vitals:   12/31/12 0945 12/31/12 1427 12/31/12 2100 01/01/13 0500  BP: 112/73 142/77 132/84 104/68  Pulse: 89 83 79 74  Temp:  98.9 F (37.2 C) 97.9 F (36.6 C) 97.8 F (36.6 C)  TempSrc:      Resp:  18 16 18   Height:      Weight:      SpO2:  98% 98% 98%   I&O's:   Intake/Output Summary (Last 24 hours) at 01/01/13 0836 Last data filed at 01/01/13 0540  Gross per 24 hour  Intake   1480 ml  Output      0 ml  Net   1480 ml   TELEMETRY: Reviewed telemetry pt in NSR:     PHYSICAL EXAM General: Well developed, well nourished, in no acute distress Head: Eyes PERRLA, No xanthomas.   Normal cephalic and atramatic  Lungs:   Clear bilaterally to auscultation and percussion. Heart:   HRRR S1 S2 Pulses are 2+ & equal. Abdomen: Bowel sounds are positive, abdomen soft and non-tender without masses Extremities:   No clubbing, cyanosis or edema.  DP +1 Neuro: Alert and oriented X 3. Psych:  Good affect, responds appropriately   LABS: Basic Metabolic Panel:  Recent Labs  78/29/56 0520 01/01/13 0519  NA 138 142  K 4.0 4.1  CL 106 108  CO2 23 26  GLUCOSE 169* 164*  BUN 15 15  CREATININE 1.20 1.39*  CALCIUM 9.1 9.2   Liver Function Tests: No results found for this basename: AST, ALT, ALKPHOS, BILITOT, PROT, ALBUMIN,  in the last 72 hours No results found for this basename: LIPASE, AMYLASE,  in the last 72 hours CBC:  Recent Labs  12/31/12 0520 01/01/13 0519  WBC 3.9* 4.5  HGB 14.6 14.7  HCT 43.2 42.3  MCV 74.7* 74.5*  PLT 202 201   Cardiac Enzymes: No results found for this basename: CKTOTAL, CKMB, CKMBINDEX, TROPONINI,  in the last 72 hours BNP: No components found with this basename: POCBNP,  D-Dimer: No results found for this basename: DDIMER,  in the last 72 hours Hemoglobin A1C: No results found for this basename: HGBA1C,  in the last 72 hours Fasting Lipid Panel: No  results found for this basename: CHOL, HDL, LDLCALC, TRIG, CHOLHDL, LDLDIRECT,  in the last 72 hours Thyroid Function Tests: No results found for this basename: TSH, T4TOTAL, FREET3, T3FREE, THYROIDAB,  in the last 72 hours Anemia Panel: No results found for this basename: VITAMINB12, FOLATE, FERRITIN, TIBC, IRON, RETICCTPCT,  in the last 72 hours Coag Panel:   Lab Results  Component Value Date   INR 0.93 05/16/2012   INR 0.99 03/06/2012    RADIOLOGY: Ct Chest W Contrast  12/31/2012   **ADDENDUM** CREATED: 12/31/2012 11:32:28  In addition to the conclusions discussed in the original dictation, the appearance of the lower lobes of the lungs is unusual and suggestive of an interstitial lung disease.  The pattern is nonspecific, but favored to represent a nonspecific interstitial pneumonia (NSIP).  Attention on a 1-year follow-up high-resolution chest CT is suggested to assess for any temporal progression in disease.  **END ADDENDUM** SIGNED BY: Florencia Reasons, M.D.  12/31/2012   *RADIOLOGY REPORT*  Clinical Data: History of mediastinal seminoma.  Shortness of breath.  CT CHEST WITH CONTRAST  Technique:  Multidetector CT imaging of the chest was performed following the standard protocol during bolus administration of intravenous  contrast.  Contrast: 80mL OMNIPAQUE IOHEXOL 300 MG/ML  SOLN  Comparison: No priors.  Findings:  Mediastinum: Heart size is normal. A small amount of anterior pericardial fluid and/or thickening, unlikely to be of hemodynamic significance at this time.  No associated pericardial calcification.  Surgical clips are present throughout the mediastinum, likely from prior resection of the reported seminoma. No definite recurrent soft tissue mass is identified. No pathologically enlarged mediastinal or hilar lymph nodes. There is atherosclerosis of the thoracic aorta, the great vessels of the mediastinum and the coronary arteries, including calcified atherosclerotic plaque in the left  main, left anterior descending, left circumflex and right coronary arteries. Calcifications of the aortic valve extending inferiorly along the mitral-aortic intervalvular fibrosa and the anterior leaflet of the mitral valve. The esophagus is mildly patulous, but otherwise unremarkable in appearance.  Lungs/Pleura: There are a few scattered pulmonary nodules in the lungs bilaterally.  The largest nodules include a 5 mm nodule associated with the minor fissure (image 31 of series 3) and a 6 mm area of nodular thickening along and otherwise linear opacity in the superior segment of the left lower lobe (image 19 of series 3) which is favored to represent an area of scarring.  No acute consolidative airspace disease.  No pleural effusions.  There are some areas of mild cylindrical bronchiectasis with peribronchovascular interstitial thickening and architectural distortion with some surrounding ground-glass attenuation predominately in the medial aspects of the lower lobes of the lungs bilaterally.  No frank honeycombing identified.  Upper Abdomen: Ill-defined 1.8 cm area of hypervascularity in the right lobe of the liver (segment 8) on image 50 of series 2 is incompletely characterized.  Musculoskeletal: Status post median sternotomy. There are no aggressive appearing lytic or blastic lesions noted in the visualized portions of the skeleton.  IMPRESSION:  1.  Status post median sternotomy for resection of a mediastinal mass without evidence of local recurrence of disease. 2.  A few scattered small pulmonary nodules are noted in the lungs bilaterally, as above.  These are nonspecific. If the patient is at high risk for bronchogenic carcinoma, follow-up chest CT at 6-12 months is recommended.  If the patient is at low risk for bronchogenic carcinoma, follow-up chest CT at 12 months is recommended.  This recommendation follows the consensus statement: Guidelines for Management of Small Pulmonary Nodules Detected on CT  Scans: A Statement from the Fleischner Society as published in Radiology 2005; 237:395-400. 3. Atherosclerosis, including left main and three-vessel coronary artery disease.   Assessment for potential risk factor modification, dietary therapy or pharmacologic therapy may be warranted, if clinically indicated. 4.  1.8 cm hypervascular lesion in segment 8 of the liver is incompletely characterized on today's examination.  This may simply represent a benign perfusion anomaly (transit hepatic attenuation difference (THAD)), but is nonspecific. This could be definitively characterized with contrast enhanced hepatic MRI if of clinical concern. 5.  Calcifications of the aortic valve extending inferiorly along the mitral-aortic intervalvular fibrosa and the anterior leaflet of the mitral valve. Echocardiographic correlation for evidence of valvular dysfunction may be warranted if clinically indicated. 6.  Small amount of pericardial fluid and/or thickening, unlikely to be of hemodynamic significance at this time.   Original Report Authenticated By: Trudie Reed, M.D.   Assessment/ Plan:   1. CAD: He has a tight proximal LCx stenosis and an ostial RCA stenosis. Plan is for CABG this week - Thursday or Friday ( he was on Plavix and we need to allow Plavix  to wash out)  2: HTN: Currently well controlled.  3. Diabetes mellitus:     Quintella Reichert, MD  01/01/2013  8:36 AM

## 2013-01-02 LAB — BASIC METABOLIC PANEL
Calcium: 9.1 mg/dL (ref 8.4–10.5)
GFR calc Af Amer: 76 mL/min — ABNORMAL LOW (ref >90)
GFR calc non Af Amer: 65 mL/min — ABNORMAL LOW (ref >90)
Glucose, Bld: 105 mg/dL — ABNORMAL HIGH (ref 70–99)
Potassium: 3.8 mEq/L (ref 3.5–5.1)
Sodium: 140 mEq/L (ref 135–145)

## 2013-01-02 LAB — CBC
HCT: 43.4 % (ref 39.0–52.0)
MCHC: 34.8 g/dL (ref 30.0–36.0)
Platelets: 204 10*3/uL (ref 150–400)
RDW: 19.3 % — ABNORMAL HIGH (ref 11.5–15.5)
WBC: 4 10*3/uL (ref 4.0–10.5)

## 2013-01-02 LAB — GLUCOSE, CAPILLARY
Glucose-Capillary: 85 mg/dL (ref 70–99)
Glucose-Capillary: 238 mg/dL — ABNORMAL HIGH (ref 70–99)
Glucose-Capillary: 239 mg/dL — ABNORMAL HIGH (ref 70–99)

## 2013-01-02 LAB — PLATELET INHIBITION P2Y12: Platelet Function  P2Y12: 151 [PRU] — ABNORMAL LOW (ref 194–418)

## 2013-01-02 LAB — HEPARIN LEVEL (UNFRACTIONATED): Heparin Unfractionated: 0.69 IU/mL (ref 0.30–0.70)

## 2013-01-02 MED ORDER — LACTULOSE 10 GM/15ML PO SOLN
30.0000 g | Freq: Every day | ORAL | Status: AC
  Administered 2013-01-02: 30 g via ORAL
  Filled 2013-01-02: qty 45

## 2013-01-02 MED ORDER — PANTOPRAZOLE SODIUM 40 MG PO TBEC
40.0000 mg | DELAYED_RELEASE_TABLET | Freq: Every day | ORAL | Status: DC
  Administered 2013-01-03: 40 mg via ORAL
  Filled 2013-01-02: qty 1

## 2013-01-02 NOTE — Progress Notes (Signed)
SUBJECTIVE:  No complaints  OBJECTIVE:   Vitals:   Filed Vitals:   01/01/13 0500 01/01/13 1337 01/01/13 2045 01/02/13 0621  BP: 104/68 103/63 121/65 123/74  Pulse: 74 77 78 75  Temp: 97.8 F (36.6 C) 97.9 F (36.6 C) 97.4 F (36.3 C) 97.3 F (36.3 C)  TempSrc:  Oral Oral Oral  Resp: 18 18  18   Height:      Weight:      SpO2: 98% 99% 99% 98%   I&O's:   Intake/Output Summary (Last 24 hours) at 01/02/13 4098 Last data filed at 01/01/13 1900  Gross per 24 hour  Intake    500 ml  Output      0 ml  Net    500 ml   TELEMETRY: Reviewed telemetry pt in NSR:     PHYSICAL EXAM General: Well developed, well nourished, in no acute distress Head: Eyes PERRLA, No xanthomas.   Normal cephalic and atramatic  Lungs:   Clear bilaterally to auscultation and percussion. Heart:   HRRR S1 S2 Pulses are 2+ & equal. Abdomen: Bowel sounds are positive, abdomen soft and non-tender without masses  Extremities:   No clubbing, cyanosis or edema.  DP +1 Neuro: Alert and oriented X 3. Psych:  Good affect, responds appropriately   LABS: Basic Metabolic Panel:  Recent Labs  11/91/47 0519 01/02/13 0435  NA 142 140  K 4.1 3.8  CL 108 106  CO2 26 23  GLUCOSE 164* 105*  BUN 15 15  CREATININE 1.39* 1.16  CALCIUM 9.2 9.1   Liver Function Tests: No results found for this basename: AST, ALT, ALKPHOS, BILITOT, PROT, ALBUMIN,  in the last 72 hours No results found for this basename: LIPASE, AMYLASE,  in the last 72 hours CBC:  Recent Labs  01/01/13 0519 01/02/13 0435  WBC 4.5 4.0  HGB 14.7 15.1  HCT 42.3 43.4  MCV 74.5* 74.8*  PLT 201 204   Coag Panel:   Lab Results  Component Value Date   INR 0.93 05/16/2012   INR 0.99 03/06/2012    RADIOLOGY: Ct Chest W Contrast  12/31/2012   **ADDENDUM** CREATED: 12/31/2012 11:32:28  In addition to the conclusions discussed in the original dictation, the appearance of the lower lobes of the lungs is unusual and suggestive of an interstitial lung  disease.  The pattern is nonspecific, but favored to represent a nonspecific interstitial pneumonia (NSIP).  Attention on a 1-year follow-up high-resolution chest CT is suggested to assess for any temporal progression in disease.  **END ADDENDUM** SIGNED BY: Florencia Reasons, M.D.  12/31/2012   *RADIOLOGY REPORT*  Clinical Data: History of mediastinal seminoma.  Shortness of breath.  CT CHEST WITH CONTRAST  Technique:  Multidetector CT imaging of the chest was performed following the standard protocol during bolus administration of intravenous contrast.  Contrast: 80mL OMNIPAQUE IOHEXOL 300 MG/ML  SOLN  Comparison: No priors.  Findings:  Mediastinum: Heart size is normal. A small amount of anterior pericardial fluid and/or thickening, unlikely to be of hemodynamic significance at this time.  No associated pericardial calcification.  Surgical clips are present throughout the mediastinum, likely from prior resection of the reported seminoma. No definite recurrent soft tissue mass is identified. No pathologically enlarged mediastinal or hilar lymph nodes. There is atherosclerosis of the thoracic aorta, the great vessels of the mediastinum and the coronary arteries, including calcified atherosclerotic plaque in the left main, left anterior descending, left circumflex and right coronary arteries. Calcifications of the aortic valve  extending inferiorly along the mitral-aortic intervalvular fibrosa and the anterior leaflet of the mitral valve. The esophagus is mildly patulous, but otherwise unremarkable in appearance.  Lungs/Pleura: There are a few scattered pulmonary nodules in the lungs bilaterally.  The largest nodules include a 5 mm nodule associated with the minor fissure (image 31 of series 3) and a 6 mm area of nodular thickening along and otherwise linear opacity in the superior segment of the left lower lobe (image 19 of series 3) which is favored to represent an area of scarring.  No acute consolidative  airspace disease.  No pleural effusions.  There are some areas of mild cylindrical bronchiectasis with peribronchovascular interstitial thickening and architectural distortion with some surrounding ground-glass attenuation predominately in the medial aspects of the lower lobes of the lungs bilaterally.  No frank honeycombing identified.  Upper Abdomen: Ill-defined 1.8 cm area of hypervascularity in the right lobe of the liver (segment 8) on image 50 of series 2 is incompletely characterized.  Musculoskeletal: Status post median sternotomy. There are no aggressive appearing lytic or blastic lesions noted in the visualized portions of the skeleton.  IMPRESSION:  1.  Status post median sternotomy for resection of a mediastinal mass without evidence of local recurrence of disease. 2.  A few scattered small pulmonary nodules are noted in the lungs bilaterally, as above.  These are nonspecific. If the patient is at high risk for bronchogenic carcinoma, follow-up chest CT at 6-12 months is recommended.  If the patient is at low risk for bronchogenic carcinoma, follow-up chest CT at 12 months is recommended.  This recommendation follows the consensus statement: Guidelines for Management of Small Pulmonary Nodules Detected on CT Scans: A Statement from the Fleischner Society as published in Radiology 2005; 237:395-400. 3. Atherosclerosis, including left main and three-vessel coronary artery disease.   Assessment for potential risk factor modification, dietary therapy or pharmacologic therapy may be warranted, if clinically indicated. 4.  1.8 cm hypervascular lesion in segment 8 of the liver is incompletely characterized on today's examination.  This may simply represent a benign perfusion anomaly (transit hepatic attenuation difference (THAD)), but is nonspecific. This could be definitively characterized with contrast enhanced hepatic MRI if of clinical concern. 5.  Calcifications of the aortic valve extending inferiorly  along the mitral-aortic intervalvular fibrosa and the anterior leaflet of the mitral valve. Echocardiographic correlation for evidence of valvular dysfunction may be warranted if clinically indicated. 6.  Small amount of pericardial fluid and/or thickening, unlikely to be of hemodynamic significance at this time.   Original Report Authenticated By: Trudie Reed, M.D.   Assessment/ Plan:   1. CAD: He has a tight proximal LCx stenosis and an ostial RCA stenosis. Plan is for CABG this week - Thursday or Friday ( he was on Plavix and we need to allow Plavix to wash out)  2: HTN: Currently well controlled.  3. Diabetes mellitus:        Quintella Reichert, MD  01/02/2013  9:09 AM

## 2013-01-02 NOTE — Progress Notes (Signed)
Procedure(s) (LRB): CORONARY ARTERY BYPASS GRAFTING (CABG) (N/A) INTRAOPERATIVE TRANSESOPHAGEAL ECHOCARDIOGRAM (N/A) REDO STERNOTOMY (N/A) RADIAL ARTERY HARVEST (Left) Subjective: Severe multivessel CAD with unstable angina History PCI with Plavix on presentation P2 Y. 12 assay still with abnormal Plavix effect Pre-CABG Dopplers within normal limit Patient stable on IV heparin  Objective: Vital signs in last 24 hours: Temp:  [97.3 F (36.3 C)-97.7 F (36.5 C)] 97.7 F (36.5 C) (05/21 1430) Pulse Rate:  [71-78] 71 (05/21 1430) Cardiac Rhythm:  [-] Normal sinus rhythm (05/21 0830) Resp:  [17-18] 17 (05/21 1430) BP: (102-123)/(64-74) 102/64 mmHg (05/21 1430) SpO2:  [98 %-99 %] 99 % (05/21 1430)  Hemodynamic parameters for last 24 hours:   sinus rhythm  Intake/Output from previous day: 05/20 0701 - 05/21 0700 In: 740 [P.O.:480; I.V.:200] Out: -  Intake/Output this shift:    Sinus rhythm Lungs clear Extremities with minimal edema Cardiac cath site without hematoma  Lab Results:  Recent Labs  01/01/13 0519 01/02/13 0435  WBC 4.5 4.0  HGB 14.7 15.1  HCT 42.3 43.4  PLT 201 204   BMET:  Recent Labs  01/01/13 0519 01/02/13 0435  NA 142 140  K 4.1 3.8  CL 108 106  CO2 26 23  GLUCOSE 164* 105*  BUN 15 15  CREATININE 1.39* 1.16  CALCIUM 9.2 9.1    PT/INR: No results found for this basename: LABPROT, INR,  in the last 72 hours ABG    Component Value Date/Time   PHART 7.412 12/28/2012 1825   HCO3 22.9 12/28/2012 1825   TCO2 24.0 12/28/2012 1825   ACIDBASEDEF 1.1 12/28/2012 1825   O2SAT 95.8 12/28/2012 1825   CBG (last 3)   Recent Labs  01/02/13 0735 01/02/13 1139 01/02/13 1700  GLUCAP 85 238* 239*    Assessment/Plan: S/P Procedure(s) (LRB): CORONARY ARTERY BYPASS GRAFTING (CABG) (N/A) INTRAOPERATIVE TRANSESOPHAGEAL ECHOCARDIOGRAM (N/A) REDO STERNOTOMY (N/A) RADIAL ARTERY HARVEST (Left) Plan CABG on May 23   LOS: 5 days    Bill TRIGT  [name],[name] 01/02/2013

## 2013-01-02 NOTE — Progress Notes (Signed)
ANTICOAGULATION CONSULT NOTE   Pharmacy Consult for Heparin Indication: ACS/CAD  Allergies  Allergen Reactions  . Adhesive (Tape)   . Lipitor (Atorvastatin) Other (See Comments)    myalgias    Patient Measurements: Height: 5' 10.87" (180 cm) Weight: 192 lb 0.3 oz (87.1 kg) IBW/kg (Calculated) : 74.99 Heparin Dosing Weight: 87kg  Vital Signs: Temp: 97.3 F (36.3 C) (05/21 0621) Temp src: Oral (05/21 0621) BP: 123/74 mmHg (05/21 0621) Pulse Rate: 75 (05/21 0621)  Labs:  Recent Labs  12/31/12 0520 01/01/13 0519 01/02/13 0435  HGB 14.6 14.7 15.1  HCT 43.2 42.3 43.4  PLT 202 201 204  HEPARINUNFRC 0.53 0.62 0.69  CREATININE 1.20 1.39* 1.16    Estimated Creatinine Clearance: 69.1 ml/min (by C-G formula based on Cr of 1.16).   Medical History: Past Medical History  Diagnosis Date  . Erectile dysfunction   . Hypertension   . Dyslipidemia   . Chronic kidney disease     kidney stones s/p lithotripsy  . Stroke     TIA's  . Cancer     mediastinal seminoma resected 1985 - benign  . Chest pain   . Shortness of breath   . Diabetes mellitus     neuropathy  insulin dependent  . GERD (gastroesophageal reflux disease)   . NSTEMI (non-ST elevated myocardial infarction)   . Coronary artery disease 02/2012    60% mid RCA, 80% prox left circ, 99% distal left circ, normal LAD s/p PCI left circ/OM    Medications:  Prescriptions prior to admission  Medication Sig Dispense Refill  . amitriptyline (ELAVIL) 100 MG tablet Take 100 mg by mouth at bedtime.      . clopidogrel (PLAVIX) 75 MG tablet Take 75 mg by mouth daily.      . insulin aspart (NOVOLOG) 100 UNIT/ML injection Inject 6-14 Units into the skin 3 (three) times daily before meals. Patient's dose is done on a sliding scale. Usually takes 8 units, but dose varies depending on glucose level.      . insulin glargine (LANTUS) 100 UNIT/ML injection Inject 40 Units into the skin at bedtime.      . iron polysaccharides  (NIFEREX) 150 MG capsule Take 300 mg by mouth daily.       . isosorbide mononitrate (IMDUR) 30 MG 24 hr tablet Take 60 mg by mouth daily.      Marland Kitchen losartan (COZAAR) 25 MG tablet Take 25 mg by mouth daily.      . metFORMIN (GLUCOPHAGE) 500 MG tablet Take 1,000 mg by mouth 2 (two) times daily with a meal.      . metoprolol succinate (TOPROL-XL) 50 MG 24 hr tablet Take 100 mg by mouth daily. Take with or immediately following a meal.      . Multiple Vitamin (MULTIVITAMIN) tablet Take 1 tablet by mouth daily.      . nitroGLYCERIN (NITROSTAT) 0.4 MG SL tablet Place 0.4 mg under the tongue every 5 (five) minutes as needed for chest pain.      . Omega-3 Fatty Acids (FISH OIL MAXIMUM STRENGTH) 1200 MG CAPS Take 3,600 mg by mouth 2 (two) times daily.      Marland Kitchen omeprazole (PRILOSEC) 20 MG capsule Take 20 mg by mouth daily as needed (heartburn).       . simvastatin (ZOCOR) 40 MG tablet Take 40 mg by mouth every evening.      . zolpidem (AMBIEN) 10 MG tablet Take 10 mg by mouth at bedtime as needed for sleep.       . [  DISCONTINUED] metFORMIN (GLUCOPHAGE) 500 MG tablet Take 2 tablets (1,000 mg total) by mouth 2 (two) times daily with a meal. Do not restart Metformin until 7/26      . [DISCONTINUED] nitroGLYCERIN (NITROSTAT) 0.4 MG SL tablet Place 1 tablet (0.4 mg total) under the tongue every 5 (five) minutes x 3 doses as needed for chest pain.  30 tablet  5    Assessment: 62 y/o male patient admitted with CP.  Now s/p cardiac cath with severe 3 vessel CAD requiring CVTS. Heparin level trending up on 1500 units/hr.  Will decrease heparin rate slightly to maintain goal.  Plan for CABG later this week after Plavix washout.   Goal of Therapy:  Heparin level 0.3-0.7 units/ml Monitor platelets by anticoagulation protocol: Yes   Plan:  Reduce heparin to 1400 units/hr F/u with heparin level, CBC in AM.  Dixie Dials, Pharm.D., BCPS Clinical Pharmacist Pager [phone] 01/02/2013 11:08 AM

## 2013-01-02 NOTE — Progress Notes (Signed)
Inpatient Diabetes Program Recommendations  AACE/ADA: New Consensus Statement on Inpatient Glycemic Control (2013)  Target Ranges:  Prepandial:   less than 140 mg/dL      Peak postprandial:   less than 180 mg/dL (1-2 hours)      Critically ill patients:  140 - 180 mg/dL  Results for [name], [name] (MRN [id number]) as of 01/02/2013 12:23  Ref. Range 01/01/2013 16:32 01/01/2013 20:47 01/02/2013 07:35 01/02/2013 11:39  Glucose-Capillary Latest Range: 70-99 mg/dL 469 (H) 629 (H) 85 528 (H)    Inpatient Diabetes Program Recommendations Insulin - Basal: . Correction (SSI): . Insulin - Meal Coverage: add Novolog 4-5 units TID with meals for elevated postprandials  Thank you  Piedad Climes BSN, RN,CDE Inpatient Diabetes Coordinator [phone] (team pager)

## 2013-01-03 ENCOUNTER — Encounter (HOSPITAL_COMMUNITY): Payer: Self-pay

## 2013-01-03 ENCOUNTER — Inpatient Hospital Stay (HOSPITAL_COMMUNITY): Payer: 59

## 2013-01-03 DIAGNOSIS — I2510 Atherosclerotic heart disease of native coronary artery without angina pectoris: Secondary | ICD-10-CM

## 2013-01-03 LAB — CBC
Hemoglobin: 15.7 g/dL (ref 13.0–17.0)
MCHC: 34.1 g/dL (ref 30.0–36.0)
RDW: 18.8 % — ABNORMAL HIGH (ref 11.5–15.5)

## 2013-01-03 LAB — SURGICAL PCR SCREEN
MRSA, PCR: NEGATIVE
Staphylococcus aureus: NEGATIVE

## 2013-01-03 LAB — PROTIME-INR
INR: 0.98 (ref 0.00–1.49)
Prothrombin Time: 12.9 seconds (ref 11.6–15.2)

## 2013-01-03 LAB — HEPARIN LEVEL (UNFRACTIONATED): Heparin Unfractionated: 0.53 IU/mL (ref 0.30–0.70)

## 2013-01-03 LAB — GLUCOSE, CAPILLARY
Glucose-Capillary: 118 mg/dL — ABNORMAL HIGH (ref 70–99)
Glucose-Capillary: 158 mg/dL — ABNORMAL HIGH (ref 70–99)
Glucose-Capillary: 252 mg/dL — ABNORMAL HIGH (ref 70–99)
Glucose-Capillary: 206 mg/dL — ABNORMAL HIGH (ref 70–99)

## 2013-01-03 LAB — PREPARE RBC (CROSSMATCH)

## 2013-01-03 LAB — PLATELET INHIBITION P2Y12: Platelet Function  P2Y12: 181 [PRU] — ABNORMAL LOW (ref 194–418)

## 2013-01-03 LAB — BASIC METABOLIC PANEL
CO2: 25 mEq/L (ref 19–32)
Calcium: 9.6 mg/dL (ref 8.4–10.5)
GFR calc non Af Amer: 55 mL/min — ABNORMAL LOW (ref >90)
Sodium: 142 mEq/L (ref 135–145)

## 2013-01-03 LAB — APTT: aPTT: 83 seconds — ABNORMAL HIGH (ref 24–37)

## 2013-01-03 MED ORDER — DOPAMINE-DEXTROSE 3.2-5 MG/ML-% IV SOLN
2.0000 ug/kg/min | INTRAVENOUS | Status: DC
  Filled 2013-01-03: qty 250

## 2013-01-03 MED ORDER — MAGNESIUM SULFATE 50 % IJ SOLN
40.0000 meq | INTRAMUSCULAR | Status: DC
  Filled 2013-01-03: qty 10

## 2013-01-03 MED ORDER — DEXTROSE 5 % IV SOLN
750.0000 mg | INTRAVENOUS | Status: DC
  Filled 2013-01-03: qty 750

## 2013-01-03 MED ORDER — NITROGLYCERIN IN D5W 200-5 MCG/ML-% IV SOLN
2.0000 ug/min | INTRAVENOUS | Status: DC
  Filled 2013-01-03: qty 250

## 2013-01-03 MED ORDER — VANCOMYCIN HCL 10 G IV SOLR
1500.0000 mg | INTRAVENOUS | Status: AC
  Administered 2013-01-04: 1500 mg via INTRAVENOUS
  Filled 2013-01-03: qty 1500

## 2013-01-03 MED ORDER — SODIUM CHLORIDE 0.9 % IV SOLN
INTRAVENOUS | Status: DC
  Filled 2013-01-03: qty 1

## 2013-01-03 MED ORDER — METOPROLOL TARTRATE 12.5 MG HALF TABLET
12.5000 mg | ORAL_TABLET | Freq: Once | ORAL | Status: AC
  Administered 2013-01-04: 12.5 mg via ORAL
  Filled 2013-01-03: qty 1

## 2013-01-03 MED ORDER — CHLORHEXIDINE GLUCONATE 4 % EX LIQD
60.0000 mL | Freq: Once | CUTANEOUS | Status: AC
  Administered 2013-01-03: 4 via TOPICAL
  Filled 2013-01-03: qty 60

## 2013-01-03 MED ORDER — DEXTROSE 5 % IV SOLN
1.5000 g | INTRAVENOUS | Status: AC
  Administered 2013-01-04: .75 g via INTRAVENOUS
  Administered 2013-01-04: 1.5 g via INTRAVENOUS
  Filled 2013-01-03: qty 1.5

## 2013-01-03 MED ORDER — DEXMEDETOMIDINE HCL IN NACL 400 MCG/100ML IV SOLN
0.1000 ug/kg/h | INTRAVENOUS | Status: DC
Start: 2013-01-04 — End: 2013-01-04
  Filled 2013-01-03: qty 100

## 2013-01-03 MED ORDER — SODIUM CHLORIDE 0.9 % IV SOLN
INTRAVENOUS | Status: DC
  Filled 2013-01-03: qty 30

## 2013-01-03 MED ORDER — POTASSIUM CHLORIDE 2 MEQ/ML IV SOLN
80.0000 meq | INTRAVENOUS | Status: DC
  Filled 2013-01-03: qty 40

## 2013-01-03 MED ORDER — DIAZEPAM 5 MG PO TABS
5.0000 mg | ORAL_TABLET | Freq: Once | ORAL | Status: AC
  Administered 2013-01-04: 5 mg via ORAL
  Filled 2013-01-03: qty 1

## 2013-01-03 MED ORDER — SODIUM CHLORIDE 0.9 % IV SOLN
INTRAVENOUS | Status: DC
  Filled 2013-01-03: qty 40

## 2013-01-03 MED ORDER — TEMAZEPAM 15 MG PO CAPS: 15.0000 mg | ORAL_CAPSULE | Freq: Once | ORAL | Status: AC | PRN

## 2013-01-03 MED ORDER — PHENYLEPHRINE HCL 10 MG/ML IJ SOLN
30.0000 ug/min | INTRAVENOUS | Status: DC
  Filled 2013-01-03: qty 2

## 2013-01-03 MED ORDER — CHLORHEXIDINE GLUCONATE 4 % EX LIQD
60.0000 mL | Freq: Once | CUTANEOUS | Status: AC
  Administered 2013-01-04: 4 via TOPICAL
  Filled 2013-01-03: qty 60

## 2013-01-03 MED ORDER — DEXTROSE 5 % IV SOLN
0.5000 ug/min | INTRAVENOUS | Status: DC
  Filled 2013-01-03: qty 4

## 2013-01-03 MED ORDER — PLASMA-LYTE 148 IV SOLN
INTRAVENOUS | Status: AC
  Administered 2013-01-04: 11:00:00
  Filled 2013-01-03: qty 2.5

## 2013-01-03 MED ORDER — SORBITOL 70 % SOLN
120.0000 mL | Freq: Every day | Status: DC | PRN
  Administered 2013-01-03: 120 mL via ORAL
  Filled 2013-01-03 (×3): qty 120

## 2013-01-03 MED ORDER — BISACODYL 5 MG PO TBEC
5.0000 mg | DELAYED_RELEASE_TABLET | Freq: Once | ORAL | Status: AC
  Administered 2013-01-03: 5 mg via ORAL
  Filled 2013-01-03: qty 1

## 2013-01-03 MED ORDER — DIAZEPAM 5 MG PO TABS: 5.0000 mg | ORAL_TABLET | ORAL | Status: DC | PRN

## 2013-01-03 NOTE — Progress Notes (Addendum)
SUBJECTIVE:  Doing well no compliants  OBJECTIVE:   Vitals:   Filed Vitals:   01/02/13 0621 01/02/13 1430 01/02/13 2100 01/03/13 0500  BP: 123/74 102/64 129/73 120/69  Pulse: 75 71 85 74  Temp: 97.3 F (36.3 C) 97.7 F (36.5 C) 98.1 F (36.7 C) 97.8 F (36.6 C)  TempSrc: Oral     Resp: 18 17 18 18   Height:      Weight:      SpO2: 98% 99% 99% 99%   I&O's:   Intake/Output Summary (Last 24 hours) at 01/03/13 0817 Last data filed at 01/02/13 1200  Gross per 24 hour  Intake    360 ml  Output      0 ml  Net    360 ml   TELEMETRY: Reviewed telemetry pt in NSR:     PHYSICAL EXAM General: Well developed, well nourished, in no acute distress Head: Eyes PERRLA, No xanthomas.   Normal cephalic and atramatic  Lungs:   Clear bilaterally to auscultation and percussion. Heart:   HRRR S1 S2 Pulses are 2+ & equal. Abdomen: Bowel sounds are positive, abdomen soft and non-tender without masses  Extremities:   No clubbing, cyanosis or edema.  DP +1 Neuro: Alert and oriented X 3. Psych:  Good affect, responds appropriately   LABS: Basic Metabolic Panel:  Recent Labs  16/10/96 0435 01/03/13 0527  NA 140 142  K 3.8 4.1  CL 106 105  CO2 23 25  GLUCOSE 105* 150*  BUN 15 15  CREATININE 1.16 1.34  CALCIUM 9.1 9.6   Liver Function Tests: No results found for this basename: AST, ALT, ALKPHOS, BILITOT, PROT, ALBUMIN,  in the last 72 hours No results found for this basename: LIPASE, AMYLASE,  in the last 72 hours CBC:  Recent Labs  01/02/13 0435 01/03/13 0527  WBC 4.0 3.5*  HGB 15.1 15.7  HCT 43.4 46.0  MCV 74.8* 75.0*  PLT 204 186   Cardiac Enzymes: No results found for this basename: CKTOTAL, CKMB, CKMBINDEX, TROPONINI,  in the last 72 hours BNP: No components found with this basename: POCBNP,  D-Dimer: No results found for this basename: DDIMER,  in the last 72 hours Hemoglobin A1C: No results found for this basename: HGBA1C,  in the last 72 hours Fasting Lipid  Panel: No results found for this basename: CHOL, HDL, LDLCALC, TRIG, CHOLHDL, LDLDIRECT,  in the last 72 hours Thyroid Function Tests: No results found for this basename: TSH, T4TOTAL, FREET3, T3FREE, THYROIDAB,  in the last 72 hours Anemia Panel: No results found for this basename: VITAMINB12, FOLATE, FERRITIN, TIBC, IRON, RETICCTPCT,  in the last 72 hours Coag Panel:   Lab Results  Component Value Date   INR 0.93 05/16/2012   INR 0.99 03/06/2012    RADIOLOGY: Dg Chest 2 View  01/03/2013   *RADIOLOGY REPORT*  Clinical Data: Chest pain, CAD  CHEST - 2 VIEW  Comparison: 05/16/2012  Findings: Grossly unchanged cardiac silhouette and mediastinal contours post median sternotomy.  Post coronary artery stenting. No focal airspace opacities.  No pleural effusion or pneumothorax. No definite evidence of edema.  Unchanged bones.  IMPRESSION: No acute cardiopulmonary disease.   Original Report Authenticated By: Tacey Ruiz, MD   Ct Chest W Contrast  12/31/2012   **ADDENDUM** CREATED: 12/31/2012 11:32:28  In addition to the conclusions discussed in the original dictation, the appearance of the lower lobes of the lungs is unusual and suggestive of an interstitial lung disease.  The pattern is  nonspecific, but favored to represent a nonspecific interstitial pneumonia (NSIP).  Attention on a 1-year follow-up high-resolution chest CT is suggested to assess for any temporal progression in disease.  **END ADDENDUM** SIGNED BY: Florencia Reasons, M.D.  12/31/2012   *RADIOLOGY REPORT*  Clinical Data: History of mediastinal seminoma.  Shortness of breath.  CT CHEST WITH CONTRAST  Technique:  Multidetector CT imaging of the chest was performed following the standard protocol during bolus administration of intravenous contrast.  Contrast: 80mL OMNIPAQUE IOHEXOL 300 MG/ML  SOLN  Comparison: No priors.  Findings:  Mediastinum: Heart size is normal. A small amount of anterior pericardial fluid and/or thickening, unlikely to  be of hemodynamic significance at this time.  No associated pericardial calcification.  Surgical clips are present throughout the mediastinum, likely from prior resection of the reported seminoma. No definite recurrent soft tissue mass is identified. No pathologically enlarged mediastinal or hilar lymph nodes. There is atherosclerosis of the thoracic aorta, the great vessels of the mediastinum and the coronary arteries, including calcified atherosclerotic plaque in the left main, left anterior descending, left circumflex and right coronary arteries. Calcifications of the aortic valve extending inferiorly along the mitral-aortic intervalvular fibrosa and the anterior leaflet of the mitral valve. The esophagus is mildly patulous, but otherwise unremarkable in appearance.  Lungs/Pleura: There are a few scattered pulmonary nodules in the lungs bilaterally.  The largest nodules include a 5 mm nodule associated with the minor fissure (image 31 of series 3) and a 6 mm area of nodular thickening along and otherwise linear opacity in the superior segment of the left lower lobe (image 19 of series 3) which is favored to represent an area of scarring.  No acute consolidative airspace disease.  No pleural effusions.  There are some areas of mild cylindrical bronchiectasis with peribronchovascular interstitial thickening and architectural distortion with some surrounding ground-glass attenuation predominately in the medial aspects of the lower lobes of the lungs bilaterally.  No frank honeycombing identified.  Upper Abdomen: Ill-defined 1.8 cm area of hypervascularity in the right lobe of the liver (segment 8) on image 50 of series 2 is incompletely characterized.  Musculoskeletal: Status post median sternotomy. There are no aggressive appearing lytic or blastic lesions noted in the visualized portions of the skeleton.  IMPRESSION:  1.  Status post median sternotomy for resection of a mediastinal mass without evidence of local  recurrence of disease. 2.  A few scattered small pulmonary nodules are noted in the lungs bilaterally, as above.  These are nonspecific. If the patient is at high risk for bronchogenic carcinoma, follow-up chest CT at 6-12 months is recommended.  If the patient is at low risk for bronchogenic carcinoma, follow-up chest CT at 12 months is recommended.  This recommendation follows the consensus statement: Guidelines for Management of Small Pulmonary Nodules Detected on CT Scans: A Statement from the Fleischner Society as published in Radiology 2005; 237:395-400. 3. Atherosclerosis, including left main and three-vessel coronary artery disease.   Assessment for potential risk factor modification, dietary therapy or pharmacologic therapy may be warranted, if clinically indicated. 4.  1.8 cm hypervascular lesion in segment 8 of the liver is incompletely characterized on today's examination.  This may simply represent a benign perfusion anomaly (transit hepatic attenuation difference (THAD)), but is nonspecific. This could be definitively characterized with contrast enhanced hepatic MRI if of clinical concern. 5.  Calcifications of the aortic valve extending inferiorly along the mitral-aortic intervalvular fibrosa and the anterior leaflet of the mitral valve. Echocardiographic  correlation for evidence of valvular dysfunction may be warranted if clinically indicated. 6.  Small amount of pericardial fluid and/or thickening, unlikely to be of hemodynamic significance at this time.   Original Report Authenticated By: Trudie Reed, M.D.   Assessment/ Plan:   1. CAD: He has a tight proximal LCx stenosis and an ostial RCA stenosis. Plan is for CABG tomorrow 2: HTN: Currently well controlled.  3. Diabetes mellitus:        Quintella Reichert, MD  01/03/2013  8:17 AM

## 2013-01-03 NOTE — Progress Notes (Signed)
*   Surgery Date in Future * Procedure(s) (LRB): CORONARY ARTERY BYPASS GRAFTING (CABG) (N/A) INTRAOPERATIVE TRANSESOPHAGEAL ECHOCARDIOGRAM (N/A) REDO STERNOTOMY (N/A) RADIAL ARTERY HARVEST (Left) Subjective: Multivessel CAD, unstable angina P2Y12 plt assay should be ok for CABG in am  Objective: Vital signs in last 24 hours: Temp:  [97.7 F (36.5 C)-98.1 F (36.7 C)] 97.8 F (36.6 C) (05/22 0500) Pulse Rate:  [71-85] 81 (05/22 1008) Cardiac Rhythm:  [-] Normal sinus rhythm;Heart block (05/22 0749) Resp:  [17-18] 18 (05/22 0500) BP: (102-129)/(64-73) 116/70 mmHg (05/22 1008) SpO2:  [99 %] 99 % (05/22 0500)  Hemodynamic parameters for last 24 hours:  nsr  Intake/Output from previous day: 05/21 0701 - 05/22 0700 In: 720 [P.O.:720] Out: -  Intake/Output this shift:    cxr clear- prior sternotomy for mediastinal tumor/seminoma  Lab Results:  Recent Labs  01/02/13 0435 01/03/13 0527  WBC 4.0 3.5*  HGB 15.1 15.7  HCT 43.4 46.0  PLT 204 186   BMET:  Recent Labs  01/02/13 0435 01/03/13 0527  NA 140 142  K 3.8 4.1  CL 106 105  CO2 23 25  GLUCOSE 105* 150*  BUN 15 15  CREATININE 1.16 1.34  CALCIUM 9.1 9.6    PT/INR:  Recent Labs  01/03/13 1104  LABPROT 12.9  INR 0.98   ABG    Component Value Date/Time   PHART 7.412 12/28/2012 1825   HCO3 22.9 12/28/2012 1825   TCO2 24.0 12/28/2012 1825   ACIDBASEDEF 1.1 12/28/2012 1825   O2SAT 95.8 12/28/2012 1825   CBG (last 3)   Recent Labs  01/02/13 2013 01/03/13 0728 01/03/13 1120  GLUCAP 259* 118* 158*    Assessment/Plan: S/P Procedure(s) (LRB): CORONARY ARTERY BYPASS GRAFTING (CABG) (N/A) INTRAOPERATIVE TRANSESOPHAGEAL ECHOCARDIOGRAM (N/A) REDO STERNOTOMY (N/A) RADIAL ARTERY HARVEST (Left)  Plan-CABG in am, RIMA to rca and EVH to distal circumflex   LOS: 6 days    VAN TRIGT III,PETER 01/03/2013

## 2013-01-03 NOTE — Progress Notes (Signed)
ANTICOAGULATION CONSULT NOTE   Pharmacy Consult for Heparin Indication: ACS/CAD  Allergies  Allergen Reactions  . Adhesive (Tape)   . Lipitor (Atorvastatin) Other (See Comments)    myalgias    Patient Measurements: Height: 5' 10.87" (180 cm) Weight: 192 lb 0.3 oz (87.1 kg) IBW/kg (Calculated) : 74.99 Heparin Dosing Weight: 87kg  Vital Signs: Temp: 97.8 F (36.6 C) (05/22 0500) BP: 116/70 mmHg (05/22 1008) Pulse Rate: 81 (05/22 1008)  Labs:  Recent Labs  01/01/13 0519 01/02/13 0435 01/03/13 0527 01/03/13 1104  HGB 14.7 15.1 15.7  --   HCT 42.3 43.4 46.0  --   PLT 201 204 186  --   APTT  --   --   --  83*  LABPROT  --   --   --  12.9  INR  --   --   --  0.98  HEPARINUNFRC 0.62 0.69 0.53  --   CREATININE 1.39* 1.16 1.34  --     Estimated Creatinine Clearance: 59.9 ml/min (by C-G formula based on Cr of 1.34).   Medical History: Past Medical History  Diagnosis Date  . Erectile dysfunction   . Hypertension   . Dyslipidemia   . Chronic kidney disease     kidney stones s/p lithotripsy  . Stroke     TIA's  . Cancer     mediastinal seminoma resected 1985 - benign  . Chest pain   . Shortness of breath   . Diabetes mellitus     neuropathy  insulin dependent  . GERD (gastroesophageal reflux disease)   . NSTEMI (non-ST elevated myocardial infarction)   . Coronary artery disease 02/2012    60% mid RCA, 80% prox left circ, 99% distal left circ, normal LAD s/p PCI left circ/OM    Medications:  Prescriptions prior to admission  Medication Sig Dispense Refill  . amitriptyline (ELAVIL) 100 MG tablet Take 100 mg by mouth at bedtime.      . clopidogrel (PLAVIX) 75 MG tablet Take 75 mg by mouth daily.      . insulin aspart (NOVOLOG) 100 UNIT/ML injection Inject 6-14 Units into the skin 3 (three) times daily before meals. Patient's dose is done on a sliding scale. Usually takes 8 units, but dose varies depending on glucose level.      . insulin glargine (LANTUS) 100  UNIT/ML injection Inject 40 Units into the skin at bedtime.      . iron polysaccharides (NIFEREX) 150 MG capsule Take 300 mg by mouth daily.       . isosorbide mononitrate (IMDUR) 30 MG 24 hr tablet Take 60 mg by mouth daily.      Marland Kitchen losartan (COZAAR) 25 MG tablet Take 25 mg by mouth daily.      . metFORMIN (GLUCOPHAGE) 500 MG tablet Take 1,000 mg by mouth 2 (two) times daily with a meal.      . metoprolol succinate (TOPROL-XL) 50 MG 24 hr tablet Take 100 mg by mouth daily. Take with or immediately following a meal.      . Multiple Vitamin (MULTIVITAMIN) tablet Take 1 tablet by mouth daily.      . nitroGLYCERIN (NITROSTAT) 0.4 MG SL tablet Place 0.4 mg under the tongue every 5 (five) minutes as needed for chest pain.      . Omega-3 Fatty Acids (FISH OIL MAXIMUM STRENGTH) 1200 MG CAPS Take 3,600 mg by mouth 2 (two) times daily.      Marland Kitchen omeprazole (PRILOSEC) 20 MG capsule Take 20  mg by mouth daily as needed (heartburn).       . simvastatin (ZOCOR) 40 MG tablet Take 40 mg by mouth every evening.      . zolpidem (AMBIEN) 10 MG tablet Take 10 mg by mouth at bedtime as needed for sleep.       . [DISCONTINUED] metFORMIN (GLUCOPHAGE) 500 MG tablet Take 2 tablets (1,000 mg total) by mouth 2 (two) times daily with a meal. Do not restart Metformin until 7/26      . [DISCONTINUED] nitroGLYCERIN (NITROSTAT) 0.4 MG SL tablet Place 1 tablet (0.4 mg total) under the tongue every 5 (five) minutes x 3 doses as needed for chest pain.  30 tablet  5    Assessment: 62 y/o male patient admitted with CP.  Now s/p cardiac cath with severe 3 vessel CAD requiring CABG 5/23 after plavix washout P2Y12 181. Heparin drip 1400 uts/hr Heparin level at goal 0.53.    Goal of Therapy:  Heparin level 0.3-0.7 units/ml Monitor platelets by anticoagulation protocol: Yes   Plan:  Continue  heparin to 1400 units/hr Follow up after OR 5/23   Leota Sauers Pharm.D. CPP, BCPS Clinical Pharmacist [phone] 01/03/2013 1:16  PM

## 2013-01-04 ENCOUNTER — Encounter (HOSPITAL_COMMUNITY): Payer: Self-pay

## 2013-01-04 ENCOUNTER — Encounter (HOSPITAL_COMMUNITY)
Admission: AD | Disposition: A | Discharge status: Home / Self Care | Payer: Self-pay | Source: Ambulatory Visit | Attending: Cardiology

## 2013-01-04 ENCOUNTER — Encounter (HOSPITAL_COMMUNITY): Payer: Self-pay | Admitting: Certified Registered Nurse Anesthetist

## 2013-01-04 ENCOUNTER — Inpatient Hospital Stay (HOSPITAL_COMMUNITY): Payer: 59 | Admitting: Certified Registered Nurse Anesthetist

## 2013-01-04 ENCOUNTER — Inpatient Hospital Stay (HOSPITAL_COMMUNITY): Payer: 59

## 2013-01-04 DIAGNOSIS — I2510 Atherosclerotic heart disease of native coronary artery without angina pectoris: Secondary | ICD-10-CM

## 2013-01-04 HISTORY — PX: CORONARY ARTERY BYPASS GRAFT: SHX141

## 2013-01-04 HISTORY — PX: RADIAL ARTERY HARVEST: SHX5067

## 2013-01-04 HISTORY — PX: INTRAOPERATIVE TRANSESOPHAGEAL ECHOCARDIOGRAM: SHX5062

## 2013-01-04 LAB — CBC
HCT: 44.8 % (ref 39.0–52.0)
HCT: 28.8 % — ABNORMAL LOW (ref 39.0–52.0)
Hemoglobin: 15.5 g/dL (ref 13.0–17.0)
Hemoglobin: 10.6 g/dL — ABNORMAL LOW (ref 13.0–17.0)
Hemoglobin: 9.7 g/dL — ABNORMAL LOW (ref 13.0–17.0)
MCH: 26.2 pg (ref 26.0–34.0)
MCH: 25.4 pg — ABNORMAL LOW (ref 26.0–34.0)
MCH: 25.4 pg — ABNORMAL LOW (ref 26.0–34.0)
MCHC: 34.6 g/dL (ref 30.0–36.0)
MCHC: 33.7 g/dL (ref 30.0–36.0)
MCV: 75.7 fL — ABNORMAL LOW (ref 78.0–100.0)
MCV: 75.4 fL — ABNORMAL LOW (ref 78.0–100.0)
Platelets: 201 K/uL (ref 150–400)
Platelets: 134 10*3/uL — ABNORMAL LOW (ref 150–400)
RBC: 5.92 MIL/uL — ABNORMAL HIGH (ref 4.22–5.81)
RBC: 4.18 MIL/uL — ABNORMAL LOW (ref 4.22–5.81)
RBC: 3.82 MIL/uL — ABNORMAL LOW (ref 4.22–5.81)
RDW: 18.9 % — ABNORMAL HIGH (ref 11.5–15.5)
RDW: 18.7 % — ABNORMAL HIGH (ref 11.5–15.5)
WBC: 4.7 K/uL (ref 4.0–10.5)
WBC: 8.1 10*3/uL (ref 4.0–10.5)

## 2013-01-04 LAB — CREATININE, SERUM
Creatinine, Ser: 0.99 mg/dL (ref 0.50–1.35)
GFR calc Af Amer: >90 mL/min (ref >90)
GFR calc non Af Amer: 85 mL/min — ABNORMAL LOW (ref >90)

## 2013-01-04 LAB — POCT I-STAT, CHEM 8
BUN: 10 mg/dL (ref 6–23)
Creatinine, Ser: 1 mg/dL (ref 0.50–1.35)
Glucose, Bld: 152 mg/dL — ABNORMAL HIGH (ref 70–99)
Hemoglobin: 10.2 g/dL — ABNORMAL LOW (ref 13.0–17.0)
Potassium: 3.8 mEq/L (ref 3.5–5.1)
Sodium: 141 mEq/L (ref 135–145)

## 2013-01-04 LAB — POCT I-STAT 4, (NA,K, GLUC, HGB,HCT)
Glucose, Bld: 83 mg/dL (ref 70–99)
Glucose, Bld: 92 mg/dL (ref 70–99)
Glucose, Bld: 208 mg/dL — ABNORMAL HIGH (ref 70–99)
HCT: 32 % — ABNORMAL LOW (ref 39.0–52.0)
HCT: 41 % (ref 39.0–52.0)
HCT: 33 % — ABNORMAL LOW (ref 39.0–52.0)
HCT: 32 % — ABNORMAL LOW (ref 39.0–52.0)
Hemoglobin: 15 g/dL (ref 13.0–17.0)
Hemoglobin: 10.9 g/dL — ABNORMAL LOW (ref 13.0–17.0)
Hemoglobin: 13.9 g/dL (ref 13.0–17.0)
Hemoglobin: 11.2 g/dL — ABNORMAL LOW (ref 13.0–17.0)
Hemoglobin: 10.9 g/dL — ABNORMAL LOW (ref 13.0–17.0)
Potassium: 4.6 mEq/L (ref 3.5–5.1)
Potassium: 6.1 mEq/L — ABNORMAL HIGH (ref 3.5–5.1)
Sodium: 141 mEq/L (ref 135–145)
Sodium: 138 mEq/L (ref 135–145)

## 2013-01-04 LAB — POCT I-STAT 3, ART BLOOD GAS (G3+)
Acid-base deficit: 3 mmol/L — ABNORMAL HIGH (ref 0.0–2.0)
Acid-base deficit: 2 mmol/L (ref 0.0–2.0)
Acid-base deficit: 2 mmol/L (ref 0.0–2.0)
Bicarbonate: 24 mEq/L (ref 20.0–24.0)
Bicarbonate: 22.5 mEq/L (ref 20.0–24.0)
Bicarbonate: 23.9 mEq/L (ref 20.0–24.0)
Bicarbonate: 22.6 mEq/L (ref 20.0–24.0)
Patient temperature: 36.4
Patient temperature: 35.9
TCO2: 24 mmol/L (ref 0–100)
TCO2: 23 mmol/L (ref 0–100)
TCO2: 25 mmol/L (ref 0–100)
TCO2: 24 mmol/L (ref 0–100)
pCO2 arterial: 40.3 mmHg (ref 35.0–45.0)
pH, Arterial: 7.316 — ABNORMAL LOW (ref 7.350–7.450)
pH, Arterial: 7.366 (ref 7.350–7.450)
pH, Arterial: 7.311 — ABNORMAL LOW (ref 7.350–7.450)
pH, Arterial: 7.336 — ABNORMAL LOW (ref 7.350–7.450)
pH, Arterial: 7.358 (ref 7.350–7.450)
pH, Arterial: 7.329 — ABNORMAL LOW (ref 7.350–7.450)
pO2, Arterial: 365 mmHg — ABNORMAL HIGH (ref 80.0–100.0)
pO2, Arterial: 100 mmHg (ref 80.0–100.0)
pO2, Arterial: 90 mmHg (ref 80.0–100.0)
pO2, Arterial: 77 mmHg — ABNORMAL LOW (ref 80.0–100.0)

## 2013-01-04 LAB — PLATELET COUNT: Platelets: 141 10*3/uL — ABNORMAL LOW (ref 150–400)

## 2013-01-04 LAB — CARBOXYHEMOGLOBIN
Carboxyhemoglobin: 1.4 % (ref 0.5–1.5)
Methemoglobin: 1.7 % — ABNORMAL HIGH (ref 0.0–1.5)
O2 Saturation: 70.1 %
Total hemoglobin: 9.9 g/dL — ABNORMAL LOW (ref 13.5–18.0)

## 2013-01-04 LAB — MAGNESIUM: Magnesium: 2.5 mg/dL (ref 1.5–2.5)

## 2013-01-04 LAB — HEPARIN LEVEL (UNFRACTIONATED): Heparin Unfractionated: 0.18 IU/mL — ABNORMAL LOW (ref 0.30–0.70)

## 2013-01-04 LAB — PROTIME-INR
INR: 1.28 (ref 0.00–1.49)
Prothrombin Time: 15.7 seconds — ABNORMAL HIGH (ref 11.6–15.2)

## 2013-01-04 LAB — BASIC METABOLIC PANEL
Chloride: 105 mEq/L (ref 96–112)
GFR calc Af Amer: 66 mL/min — ABNORMAL LOW (ref >90)
Potassium: 3.5 mEq/L (ref 3.5–5.1)
Sodium: 141 mEq/L (ref 135–145)

## 2013-01-04 LAB — HEMOGLOBIN AND HEMATOCRIT, BLOOD
HCT: 31 % — ABNORMAL LOW (ref 39.0–52.0)
Hemoglobin: 10.5 g/dL — ABNORMAL LOW (ref 13.0–17.0)

## 2013-01-04 SURGERY — CORONARY ARTERY BYPASS GRAFTING (CABG)
Anesthesia: General | Site: Chest | Wound class: Clean

## 2013-01-04 MED ORDER — FAMOTIDINE IN NACL 20-0.9 MG/50ML-% IV SOLN
20.0000 mg | Freq: Two times a day (BID) | INTRAVENOUS | Status: AC
  Administered 2013-01-04: 20 mg via INTRAVENOUS

## 2013-01-04 MED ORDER — ROCURONIUM BROMIDE 100 MG/10ML IV SOLN
INTRAVENOUS | Status: DC | PRN
  Administered 2013-01-04: 50 mg via INTRAVENOUS
  Administered 2013-01-04: 100 mg via INTRAVENOUS

## 2013-01-04 MED ORDER — VANCOMYCIN HCL IN DEXTROSE 1-5 GM/200ML-% IV SOLN
1000.0000 mg | Freq: Two times a day (BID) | INTRAVENOUS | Status: AC
  Administered 2013-01-04 – 2013-01-05 (×3): 1000 mg via INTRAVENOUS
  Filled 2013-01-04 (×3): qty 200

## 2013-01-04 MED ORDER — SODIUM CHLORIDE 0.9 % IV SOLN: 10.0000 g | INTRAVENOUS | Status: DC | PRN

## 2013-01-04 MED ORDER — LACTATED RINGERS IV SOLN: 500.0000 mL | Freq: Once | INTRAVENOUS | Status: DC | PRN

## 2013-01-04 MED ORDER — PANTOPRAZOLE SODIUM 40 MG PO TBEC
40.0000 mg | DELAYED_RELEASE_TABLET | Freq: Every day | ORAL | Status: DC
  Administered 2013-01-06 – 2013-01-10 (×5): 40 mg via ORAL
  Filled 2013-01-04 (×5): qty 1

## 2013-01-04 MED ORDER — HEMOSTATIC AGENTS (NO CHARGE) OPTIME
TOPICAL | Status: DC | PRN
  Administered 2013-01-04: 1 via TOPICAL

## 2013-01-04 MED ORDER — PHENYLEPHRINE HCL 10 MG/ML IJ SOLN
0.0000 ug/min | INTRAVENOUS | Status: DC
  Administered 2013-01-04: 60 ug/min via INTRAVENOUS
  Filled 2013-01-04 (×3): qty 2

## 2013-01-04 MED ORDER — SODIUM CHLORIDE 0.9 % IV SOLN
INTRAVENOUS | Status: DC
  Administered 2013-01-04: 15:00:00 via INTRAVENOUS

## 2013-01-04 MED ORDER — DOPAMINE-DEXTROSE 3.2-5 MG/ML-% IV SOLN
INTRAVENOUS | Status: DC | PRN
  Administered 2013-01-04: 3 ug/kg/min via INTRAVENOUS

## 2013-01-04 MED ORDER — BISACODYL 10 MG RE SUPP: 10.0000 mg | Freq: Every day | RECTAL | Status: DC

## 2013-01-04 MED ORDER — SODIUM CHLORIDE 0.45 % IV SOLN
INTRAVENOUS | Status: DC
  Administered 2013-01-04: 15:00:00 via INTRAVENOUS

## 2013-01-04 MED ORDER — CALCIUM CHLORIDE 10 % IV SOLN
1.0000 g | Freq: Once | INTRAVENOUS | Status: AC
  Administered 2013-01-04: 1 g via INTRAVENOUS

## 2013-01-04 MED ORDER — MIDAZOLAM HCL 5 MG/5ML IJ SOLN
INTRAMUSCULAR | Status: DC | PRN
  Administered 2013-01-04: 2 mg via INTRAVENOUS
  Administered 2013-01-04: 3 mg via INTRAVENOUS
  Administered 2013-01-04: 2 mg via INTRAVENOUS
  Administered 2013-01-04: 1 mg via INTRAVENOUS
  Administered 2013-01-04: 2 mg via INTRAVENOUS

## 2013-01-04 MED ORDER — MAGNESIUM SULFATE 40 MG/ML IJ SOLN
4.0000 g | Freq: Once | INTRAMUSCULAR | Status: AC
  Administered 2013-01-04: 4 g via INTRAVENOUS
  Filled 2013-01-04: qty 100

## 2013-01-04 MED ORDER — METOCLOPRAMIDE HCL 5 MG/ML IJ SOLN
10.0000 mg | Freq: Four times a day (QID) | INTRAMUSCULAR | Status: AC
  Administered 2013-01-05 (×3): 10 mg via INTRAVENOUS
  Filled 2013-01-04 (×4): qty 2

## 2013-01-04 MED ORDER — INSULIN REGULAR BOLUS VIA INFUSION
0.0000 [IU] | Freq: Three times a day (TID) | INTRAVENOUS | Status: DC
  Filled 2013-01-04: qty 10

## 2013-01-04 MED ORDER — PROPOFOL 10 MG/ML IV BOLUS
INTRAVENOUS | Status: DC | PRN
  Administered 2013-01-04: 150 mg via INTRAVENOUS

## 2013-01-04 MED ORDER — 0.9 % SODIUM CHLORIDE (POUR BTL) OPTIME
TOPICAL | Status: DC | PRN
  Administered 2013-01-04: 1000 mL

## 2013-01-04 MED ORDER — FENTANYL CITRATE 0.05 MG/ML IJ SOLN
INTRAMUSCULAR | Status: DC | PRN
  Administered 2013-01-04: 100 ug via INTRAVENOUS
  Administered 2013-01-04: 200 ug via INTRAVENOUS
  Administered 2013-01-04 (×3): 125 ug via INTRAVENOUS
  Administered 2013-01-04: 350 ug via INTRAVENOUS
  Administered 2013-01-04 (×3): 125 ug via INTRAVENOUS
  Administered 2013-01-04 (×2): 50 ug via INTRAVENOUS

## 2013-01-04 MED ORDER — ALBUMIN HUMAN 5 % IV SOLN
250.0000 mL | INTRAVENOUS | Status: AC | PRN
  Administered 2013-01-04 (×3): 250 mL via INTRAVENOUS
  Filled 2013-01-04 (×2): qty 250

## 2013-01-04 MED ORDER — SODIUM CHLORIDE 0.9 % IJ SOLN: 3.0000 mL | INTRAMUSCULAR | Status: DC | PRN

## 2013-01-04 MED ORDER — ALBUMIN HUMAN 5 % IV SOLN
12.5000 g | Freq: Once | INTRAVENOUS | Status: AC
  Administered 2013-01-04: 12.5 g via INTRAVENOUS

## 2013-01-04 MED ORDER — LEVALBUTEROL HCL 1.25 MG/0.5ML IN NEBU
1.2500 mg | INHALATION_SOLUTION | Freq: Three times a day (TID) | RESPIRATORY_TRACT | Status: DC
  Administered 2013-01-04 – 2013-01-08 (×12): 1.25 mg via RESPIRATORY_TRACT
  Filled 2013-01-04 (×15): qty 0.5

## 2013-01-04 MED ORDER — TRAMADOL HCL 50 MG PO TABS: 50.0000 mg | ORAL_TABLET | Freq: Four times a day (QID) | ORAL | Status: DC | PRN

## 2013-01-04 MED ORDER — SODIUM CHLORIDE 0.9 % IV SOLN
100.0000 [IU] | INTRAVENOUS | Status: DC | PRN
  Administered 2013-01-04: 1 [IU]/h via INTRAVENOUS

## 2013-01-04 MED ORDER — BISACODYL 5 MG PO TBEC
10.0000 mg | DELAYED_RELEASE_TABLET | Freq: Every day | ORAL | Status: DC
  Administered 2013-01-05 – 2013-01-09 (×4): 10 mg via ORAL
  Filled 2013-01-04 (×4): qty 2

## 2013-01-04 MED ORDER — NITROGLYCERIN IN D5W 200-5 MCG/ML-% IV SOLN
INTRAVENOUS | Status: DC | PRN
  Administered 2013-01-04: 5 ug/min via INTRAVENOUS

## 2013-01-04 MED ORDER — DOCUSATE SODIUM 100 MG PO CAPS
200.0000 mg | ORAL_CAPSULE | Freq: Every day | ORAL | Status: DC
  Administered 2013-01-05 – 2013-01-09 (×4): 200 mg via ORAL
  Filled 2013-01-04 (×5): qty 2

## 2013-01-04 MED ORDER — METOPROLOL TARTRATE 25 MG/10 ML ORAL SUSPENSION
12.5000 mg | Freq: Two times a day (BID) | ORAL | Status: DC
  Filled 2013-01-04 (×3): qty 5

## 2013-01-04 MED ORDER — ACETAMINOPHEN 500 MG PO TABS
1000.0000 mg | ORAL_TABLET | Freq: Four times a day (QID) | ORAL | Status: AC
  Administered 2013-01-05 – 2013-01-09 (×18): 1000 mg via ORAL
  Filled 2013-01-04 (×20): qty 2

## 2013-01-04 MED ORDER — NITROGLYCERIN IN D5W 200-5 MCG/ML-% IV SOLN: 0.0000 ug/min | INTRAVENOUS | Status: DC

## 2013-01-04 MED ORDER — VANCOMYCIN HCL IN DEXTROSE 1-5 GM/200ML-% IV SOLN
1000.0000 mg | Freq: Once | INTRAVENOUS | Status: DC
  Filled 2013-01-04: qty 200

## 2013-01-04 MED ORDER — NOREPINEPHRINE BITARTRATE 1 MG/ML IJ SOLN
0.0000 ug/min | INTRAVENOUS | Status: DC
  Administered 2013-01-04: 5 ug/min via INTRAVENOUS
  Filled 2013-01-04 (×2): qty 4

## 2013-01-04 MED ORDER — SODIUM CHLORIDE 0.9 % IJ SOLN
3.0000 mL | Freq: Two times a day (BID) | INTRAMUSCULAR | Status: DC
  Administered 2013-01-05 – 2013-01-08 (×4): 3 mL via INTRAVENOUS

## 2013-01-04 MED ORDER — METOPROLOL TARTRATE 1 MG/ML IV SOLN: 2.5000 mg | INTRAVENOUS | Status: DC | PRN

## 2013-01-04 MED ORDER — MORPHINE SULFATE 2 MG/ML IJ SOLN: 1.0000 mg | INTRAMUSCULAR | Status: AC | PRN

## 2013-01-04 MED ORDER — PHENYLEPHRINE HCL 10 MG/ML IJ SOLN
20.0000 mg | INTRAVENOUS | Status: DC | PRN
  Administered 2013-01-04: 40 ug/min via INTRAVENOUS

## 2013-01-04 MED ORDER — LACTATED RINGERS IV SOLN
INTRAVENOUS | Status: DC | PRN
  Administered 2013-01-04: 07:00:00 via INTRAVENOUS

## 2013-01-04 MED ORDER — MIDAZOLAM HCL 2 MG/2ML IJ SOLN
2.0000 mg | INTRAMUSCULAR | Status: DC | PRN
  Administered 2013-01-04: 2 mg via INTRAVENOUS
  Filled 2013-01-04: qty 2

## 2013-01-04 MED ORDER — LIDOCAINE HCL (CARDIAC) 20 MG/ML IV SOLN
INTRAVENOUS | Status: DC | PRN
  Administered 2013-01-04: 60 mg via INTRAVENOUS

## 2013-01-04 MED ORDER — SODIUM CHLORIDE 0.9 % IV SOLN
INTRAVENOUS | Status: DC | PRN
  Administered 2013-01-04: 13:00:00 via INTRAVENOUS

## 2013-01-04 MED ORDER — POTASSIUM CHLORIDE 10 MEQ/50ML IV SOLN: 10.0000 meq | INTRAVENOUS | Status: AC

## 2013-01-04 MED ORDER — LACTATED RINGERS IV SOLN
INTRAVENOUS | Status: DC | PRN
  Administered 2013-01-04 (×2): via INTRAVENOUS

## 2013-01-04 MED ORDER — DEXMEDETOMIDINE HCL IN NACL 200 MCG/50ML IV SOLN
0.1000 ug/kg/h | INTRAVENOUS | Status: DC
  Filled 2013-01-04 (×2): qty 50

## 2013-01-04 MED ORDER — SODIUM CHLORIDE 0.9 % IV SOLN
INTRAVENOUS | Status: DC
  Administered 2013-01-05: 1.4 [IU]/h via INTRAVENOUS
  Filled 2013-01-04 (×2): qty 1

## 2013-01-04 MED ORDER — VECURONIUM BROMIDE 10 MG IV SOLR
INTRAVENOUS | Status: DC | PRN
  Administered 2013-01-04: 10 mg via INTRAVENOUS

## 2013-01-04 MED ORDER — DEXMEDETOMIDINE HCL IN NACL 400 MCG/100ML IV SOLN
INTRAVENOUS | Status: DC | PRN
  Administered 2013-01-04: .7 ug/kg/h via INTRAVENOUS

## 2013-01-04 MED ORDER — ACETAMINOPHEN 10 MG/ML IV SOLN
1000.0000 mg | Freq: Once | INTRAVENOUS | Status: AC
  Administered 2013-01-04: 1000 mg via INTRAVENOUS
  Filled 2013-01-04: qty 100

## 2013-01-04 MED ORDER — ALBUMIN HUMAN 5 % IV SOLN
INTRAVENOUS | Status: DC | PRN
  Administered 2013-01-04: 13:00:00 via INTRAVENOUS

## 2013-01-04 MED ORDER — LACTATED RINGERS IV SOLN: INTRAVENOUS | Status: DC

## 2013-01-04 MED ORDER — ONDANSETRON HCL 4 MG/2ML IJ SOLN
4.0000 mg | Freq: Four times a day (QID) | INTRAMUSCULAR | Status: DC | PRN
  Administered 2013-01-04: 4 mg via INTRAVENOUS
  Filled 2013-01-04: qty 2

## 2013-01-04 MED ORDER — DEXTROSE 5 % IV SOLN
1.5000 g | Freq: Two times a day (BID) | INTRAVENOUS | Status: AC
  Administered 2013-01-04 – 2013-01-06 (×4): 1.5 g via INTRAVENOUS
  Filled 2013-01-04 (×4): qty 1.5

## 2013-01-04 MED ORDER — MORPHINE SULFATE 2 MG/ML IJ SOLN
2.0000 mg | INTRAMUSCULAR | Status: DC | PRN
  Administered 2013-01-04: 4 mg via INTRAVENOUS
  Administered 2013-01-04 (×6): 2 mg via INTRAVENOUS
  Administered 2013-01-05: 4 mg via INTRAVENOUS
  Administered 2013-01-05 (×3): 2 mg via INTRAVENOUS
  Filled 2013-01-04 (×2): qty 1
  Filled 2013-01-04 (×2): qty 2
  Filled 2013-01-04 (×4): qty 1
  Filled 2013-01-04: qty 2
  Filled 2013-01-04: qty 1
  Filled 2013-01-04: qty 2

## 2013-01-04 MED ORDER — HEPARIN SODIUM (PORCINE) 1000 UNIT/ML IJ SOLN
INTRAMUSCULAR | Status: DC | PRN
  Administered 2013-01-04: 31000 [IU] via INTRAVENOUS

## 2013-01-04 MED ORDER — ASPIRIN EC 325 MG PO TBEC
325.0000 mg | DELAYED_RELEASE_TABLET | Freq: Every day | ORAL | Status: DC
  Administered 2013-01-05 – 2013-01-10 (×6): 325 mg via ORAL
  Filled 2013-01-04 (×6): qty 1

## 2013-01-04 MED ORDER — CHLORHEXIDINE GLUCONATE CLOTH 2 % EX PADS
6.0000 | MEDICATED_PAD | Freq: Every day | CUTANEOUS | Status: DC
  Administered 2013-01-04: 6 via TOPICAL

## 2013-01-04 MED ORDER — ASPIRIN 81 MG PO CHEW: 324.0000 mg | CHEWABLE_TABLET | Freq: Every day | ORAL | Status: DC

## 2013-01-04 MED ORDER — SODIUM CHLORIDE 0.9 % IV SOLN
250.0000 mL | INTRAVENOUS | Status: DC
  Administered 2013-01-05: 250 mL via INTRAVENOUS

## 2013-01-04 MED ORDER — DOPAMINE-DEXTROSE 3.2-5 MG/ML-% IV SOLN: 2.0000 ug/kg/min | INTRAVENOUS | Status: DC

## 2013-01-04 MED ORDER — LACTATED RINGERS IV SOLN
INTRAVENOUS | Status: DC | PRN
  Administered 2013-01-04 (×2): via INTRAVENOUS

## 2013-01-04 MED ORDER — ACETAMINOPHEN 160 MG/5ML PO SOLN: 975.0000 mg | Freq: Four times a day (QID) | ORAL | Status: DC

## 2013-01-04 MED ORDER — PROTAMINE SULFATE 10 MG/ML IV SOLN
INTRAVENOUS | Status: DC | PRN
  Administered 2013-01-04: 220 mg via INTRAVENOUS

## 2013-01-04 MED ORDER — METOPROLOL TARTRATE 12.5 MG HALF TABLET
12.5000 mg | ORAL_TABLET | Freq: Two times a day (BID) | ORAL | Status: DC
  Filled 2013-01-04 (×3): qty 1

## 2013-01-04 MED ORDER — OXYCODONE HCL 5 MG PO TABS
5.0000 mg | ORAL_TABLET | ORAL | Status: DC | PRN
  Administered 2013-01-05 – 2013-01-07 (×4): 10 mg via ORAL
  Administered 2013-01-08: 5 mg via ORAL
  Administered 2013-01-08: 10 mg via ORAL
  Administered 2013-01-09: 5 mg via ORAL
  Administered 2013-01-10: 10 mg via ORAL
  Filled 2013-01-04: qty 1
  Filled 2013-01-04 (×6): qty 2
  Filled 2013-01-04: qty 1

## 2013-01-04 MED ORDER — SODIUM CHLORIDE 0.9 % IV SOLN
10.0000 g | INTRAVENOUS | Status: DC | PRN
  Administered 2013-01-04: 5 g/h via INTRAVENOUS

## 2013-01-04 MED FILL — Sodium Chloride IV Soln 0.9%: INTRAVENOUS | Qty: 1000 | Status: AC

## 2013-01-04 MED FILL — Heparin Sodium (Porcine) Inj 1000 Unit/ML: INTRAMUSCULAR | Qty: 30 | Status: AC

## 2013-01-04 MED FILL — Magnesium Sulfate Inj 50%: INTRAMUSCULAR | Qty: 10 | Status: AC

## 2013-01-04 MED FILL — Potassium Chloride Inj 2 mEq/ML: INTRAVENOUS | Qty: 40 | Status: AC

## 2013-01-04 SURGICAL SUPPLY — 117 items
ADAPTER CARDIO PERF ANTE/RETRO (ADAPTER) ×5 IMPLANT
APPLIER CLIP 9.375 SM OPEN (CLIP) ×5
ATTRACTOMAT 16X20 MAGNETIC DRP (DRAPES) ×5 IMPLANT
BAG DECANTER FOR FLEXI CONT (MISCELLANEOUS) ×5 IMPLANT
BANDAGE ELASTIC 4 VELCRO ST LF (GAUZE/BANDAGES/DRESSINGS) ×10 IMPLANT
BANDAGE ELASTIC 6 VELCRO ST LF (GAUZE/BANDAGES/DRESSINGS) ×5 IMPLANT
BANDAGE GAUZE ELAST BULKY 4 IN (GAUZE/BANDAGES/DRESSINGS) ×10 IMPLANT
BASKET HEART  (ORDER IN 25'S) (MISCELLANEOUS) ×1
BASKET HEART (ORDER IN 25'S) (MISCELLANEOUS) ×1
BASKET HEART (ORDER IN 25S) (MISCELLANEOUS) ×3 IMPLANT
BLADE STERNUM SYSTEM 6 (BLADE) ×5 IMPLANT
BLADE SURG 12 STRL SS (BLADE) ×5 IMPLANT
BLADE SURG 15 STRL LF DISP TIS (BLADE) ×3 IMPLANT
BLADE SURG 15 STRL SS (BLADE) ×2
BLADE SURG ROTATE 9660 (MISCELLANEOUS) ×5 IMPLANT
CANISTER SUCTION 2500CC (MISCELLANEOUS) ×5 IMPLANT
CANNULA ARTERIAL NVNT 3/8 20FR (MISCELLANEOUS) ×5 IMPLANT
CANNULA GUNDRY RCSP 15FR (MISCELLANEOUS) ×5 IMPLANT
CANNULA VENOUS MAL SGL STG 40 (MISCELLANEOUS) IMPLANT
CANNULAE VENOUS MAL SGL STG 40 (MISCELLANEOUS)
CATH COUDE 22FR 5CC (CATHETERS) ×6 IMPLANT
CATH COUDE FOLEY 5CC 14FR (CATHETERS) ×5 IMPLANT
CATH CPB KIT VANTRIGT (MISCELLANEOUS) ×5 IMPLANT
CATH RIBBED COUDE 30CC (CATHETERS) ×4
CATH ROBINSON RED A/P 18FR (CATHETERS) ×15 IMPLANT
CATH THORACIC 28FR (CATHETERS) IMPLANT
CATH THORACIC 28FR RT ANG (CATHETERS) IMPLANT
CATH THORACIC 36FR (CATHETERS) IMPLANT
CATH THORACIC 36FR RT ANG (CATHETERS) ×10 IMPLANT
CLIP APPLIE 9.375 SM OPEN (CLIP) ×3 IMPLANT
CLIP TI MEDIUM 24 (CLIP) IMPLANT
CLIP TI WIDE RED SMALL 24 (CLIP) IMPLANT
CLOTH BEACON ORANGE TIMEOUT ST (SAFETY) ×10 IMPLANT
COVER MAYO STAND STRL (DRAPES) ×5 IMPLANT
COVER SURGICAL LIGHT HANDLE (MISCELLANEOUS) ×5 IMPLANT
CRADLE DONUT ADULT HEAD (MISCELLANEOUS) ×5 IMPLANT
DRAIN CHANNEL 32F RND 10.7 FF (WOUND CARE) ×5 IMPLANT
DRAPE CARDIOVASCULAR INCISE (DRAPES) ×2
DRAPE EXTREMITY T 121X128X90 (DRAPE) ×5 IMPLANT
DRAPE PROXIMA HALF (DRAPES) IMPLANT
DRAPE SLUSH/WARMER DISC (DRAPES) ×5 IMPLANT
DRAPE SRG 135X102X78XABS (DRAPES) ×3 IMPLANT
DRSG COVADERM 4X14 (GAUZE/BANDAGES/DRESSINGS) ×5 IMPLANT
ELECT BLADE 4.0 EZ CLEAN MEGAD (MISCELLANEOUS) ×5
ELECT BLADE 6.5 EXT (BLADE) ×5 IMPLANT
ELECT CAUTERY BLADE 6.4 (BLADE) ×5 IMPLANT
ELECT REM PT RETURN 9FT ADLT (ELECTROSURGICAL) ×10
ELECTRODE BLDE 4.0 EZ CLN MEGD (MISCELLANEOUS) ×3 IMPLANT
ELECTRODE REM PT RTRN 9FT ADLT (ELECTROSURGICAL) ×6 IMPLANT
GAUZE XEROFORM 5X9 LF (GAUZE/BANDAGES/DRESSINGS) ×10 IMPLANT
GEL ULTRASOUND 20GR AQUASONIC (MISCELLANEOUS) ×5 IMPLANT
GLOVE BIO SURGEON STRL SZ7.5 (GLOVE) ×10 IMPLANT
GLOVE BIOGEL PI IND STRL 6 (GLOVE) ×9 IMPLANT
GLOVE BIOGEL PI IND STRL 7.0 (GLOVE) ×6 IMPLANT
GLOVE BIOGEL PI INDICATOR 6 (GLOVE) ×6
GLOVE BIOGEL PI INDICATOR 7.0 (GLOVE) ×4
GOWN STRL NON-REIN LRG LVL3 (GOWN DISPOSABLE) ×20 IMPLANT
HARMONIC SHEARS 14CM COAG (MISCELLANEOUS) ×5 IMPLANT
HEMOSTAT POWDER SURGIFOAM 1G (HEMOSTASIS) ×15 IMPLANT
HEMOSTAT SURGICEL 2X14 (HEMOSTASIS) ×5 IMPLANT
INSERT FOGARTY XLG (MISCELLANEOUS) IMPLANT
KIT BASIN OR (CUSTOM PROCEDURE TRAY) ×5 IMPLANT
KIT ROOM TURNOVER OR (KITS) ×10 IMPLANT
KIT SUCTION CATH 14FR (SUCTIONS) ×5 IMPLANT
KIT VASOVIEW W/TROCAR VH 2000 (KITS) ×5 IMPLANT
LEAD PACING MYOCARDI (MISCELLANEOUS) ×5 IMPLANT
MARKER GRAFT CORONARY BYPASS (MISCELLANEOUS) ×15 IMPLANT
NS IRRIG 1000ML POUR BTL (IV SOLUTION) ×25 IMPLANT
PACK OPEN HEART (CUSTOM PROCEDURE TRAY) ×5 IMPLANT
PAD ARMBOARD 7.5X6 YLW CONV (MISCELLANEOUS) ×20 IMPLANT
PENCIL BUTTON HOLSTER BLD 10FT (ELECTRODE) ×5 IMPLANT
PUNCH AORTIC ROTATE 4.0MM (MISCELLANEOUS) IMPLANT
PUNCH AORTIC ROTATE 4.5MM 8IN (MISCELLANEOUS) ×5 IMPLANT
PUNCH AORTIC ROTATE 5MM 8IN (MISCELLANEOUS) IMPLANT
SPONGE GAUZE 4X4 12PLY (GAUZE/BANDAGES/DRESSINGS) ×20 IMPLANT
SPONGE LAP 18X18 X RAY DECT (DISPOSABLE) ×10 IMPLANT
SPONGE LAP 4X18 X RAY DECT (DISPOSABLE) ×5 IMPLANT
STAPLER VISISTAT 35W (STAPLE) ×5 IMPLANT
SUT BONE WAX W31G (SUTURE) ×5 IMPLANT
SUT MNCRL AB 4-0 PS2 18 (SUTURE) IMPLANT
SUT PROLENE 3 0 SH DA (SUTURE) IMPLANT
SUT PROLENE 3 0 SH1 36 (SUTURE) ×10 IMPLANT
SUT PROLENE 4 0 RB 1 (SUTURE) ×12
SUT PROLENE 4 0 SH DA (SUTURE) ×10 IMPLANT
SUT PROLENE 4-0 RB1 .5 CRCL 36 (SUTURE) ×18 IMPLANT
SUT PROLENE 5 0 C 1 36 (SUTURE) ×5 IMPLANT
SUT PROLENE 6 0 C 1 30 (SUTURE) ×5 IMPLANT
SUT PROLENE 7 0 DA (SUTURE) IMPLANT
SUT PROLENE 7.0 RB 3 (SUTURE) ×15 IMPLANT
SUT PROLENE 8 0 BV175 6 (SUTURE) ×5 IMPLANT
SUT PROLENE BLUE 7 0 (SUTURE) ×5 IMPLANT
SUT SILK  1 MH (SUTURE)
SUT SILK 1 MH (SUTURE) IMPLANT
SUT SILK 2 0 SH CR/8 (SUTURE) IMPLANT
SUT SILK 3 0 SH CR/8 (SUTURE) IMPLANT
SUT STEEL 6MS V (SUTURE) ×10 IMPLANT
SUT STEEL SZ 6 DBL 3X14 BALL (SUTURE) ×5 IMPLANT
SUT VIC AB 1 CTX 36 (SUTURE) ×4
SUT VIC AB 1 CTX36XBRD ANBCTR (SUTURE) ×6 IMPLANT
SUT VIC AB 2-0 CT1 27 (SUTURE)
SUT VIC AB 2-0 CT1 TAPERPNT 27 (SUTURE) IMPLANT
SUT VIC AB 2-0 CTX 27 (SUTURE) IMPLANT
SUT VIC AB 3-0 SH 27 (SUTURE)
SUT VIC AB 3-0 SH 27X BRD (SUTURE) IMPLANT
SUT VIC AB 3-0 X1 27 (SUTURE) IMPLANT
SUT VICRYL 4-0 PS2 18IN ABS (SUTURE) IMPLANT
SUTURE E-PAK OPEN HEART (SUTURE) ×5 IMPLANT
SYR 50ML SLIP (SYRINGE) IMPLANT
SYR TOOMEY 50ML (SYRINGE) ×10 IMPLANT
SYSTEM SAHARA CHEST DRAIN ATS (WOUND CARE) ×5 IMPLANT
TAPE CLOTH SURG 4X10 WHT LF (GAUZE/BANDAGES/DRESSINGS) ×5 IMPLANT
TOWEL OR 17X24 6PK STRL BLUE (TOWEL DISPOSABLE) ×10 IMPLANT
TOWEL OR 17X26 10 PK STRL BLUE (TOWEL DISPOSABLE) ×10 IMPLANT
TRAY FOLEY IC TEMP SENS 14FR (CATHETERS) ×5 IMPLANT
TUBING INSUFFLATION 10FT LAP (TUBING) ×5 IMPLANT
UNDERPAD 30X30 INCONTINENT (UNDERPADS AND DIAPERS) ×10 IMPLANT
WATER STERILE IRR 1000ML POUR (IV SOLUTION) ×10 IMPLANT

## 2013-01-04 NOTE — Anesthesia Postprocedure Evaluation (Signed)
  Anesthesia Post-op Note  Patient: [name]  Procedure(s) Performed: Procedure(s) with comments: CORONARY ARTERY BYPASS GRAFTING (CABG) times two using right saphenous vein harvested with endoscope. (N/A) INTRAOPERATIVE TRANSESOPHAGEAL ECHOCARDIOGRAM (N/A) REDO STERNOTOMY (N/A) RADIAL ARTERY HARVEST (Left) - Artery not havested. Unsuitable for use.  Patient Location: SICU  Anesthesia Type:General  Level of Consciousness: responds to stimulation and Patient remains intubated per anesthesia plan  Airway and Oxygen Therapy: Patient remains intubated per anesthesia plan  Post-op Pain: intubated/sedated. unable to evaluate  Post-op Assessment: Post-op Vital signs reviewed, Patient's Cardiovascular Status Stable and Respiratory Function Stable  Post-op Vital Signs: Reviewed and stable  Complications: No apparent anesthesia complications

## 2013-01-04 NOTE — Brief Op Note (Signed)
12/28/2012 - 01/04/2013  11:53 AM  PATIENT:  [name]  62 y.o. male  PRE-OPERATIVE DIAGNOSIS:  CAD  POST-OPERATIVE DIAGNOSIS:  * No post-op diagnosis entered *  PROCEDURE:  Procedure(s):  CORONARY ARTERY BYPASS GRAFTING x2 -SVG to OM -SVG to Right Coronary  ENDOSCOPIC SAPHENOUS VEIN HARVEST RIGHT THIGH  REDO STERNOTOMY (N/A)  SURGEON:  Surgeon(s) and Role: Panel 1:    * Kerin Perna, MD - Primary  Panel 2:    * Kerin Perna, MD - Primary  PHYSICIAN ASSISTANT: Lowella Dandy PA-C  ANESTHESIA:   general  EBL:  Total I/O In: 1900 [I.V.:1900] Out: 810 [Urine:810]  BLOOD ADMINISTERED: CELLSAVER and  PLTS  DRAINS: Mediastinal chest drains, left pleural chest tube   LOCAL MEDICATIONS USED:  NONE  SPECIMEN:  No Specimen  DISPOSITION OF SPECIMEN:  N/A  COUNTS:  YES  TOURNIQUET:  * No tourniquets in log *  DICTATION: .Dragon Dictation  PLAN OF CARE: Admit to inpatient   PATIENT DISPOSITION:  ICU - intubated and hemodynamically stable.   Delay start of Pharmacological VTE agent (>24hrs) due to surgical blood loss or risk of bleeding: yes

## 2013-01-04 NOTE — OR Nursing (Addendum)
1st call to SICU charge nurse made [phone].

## 2013-01-04 NOTE — OR Nursing (Signed)
On the way to SICU call at [phone].

## 2013-01-04 NOTE — OR Nursing (Signed)
Update to family off bypass 1220 via volunteer desk.

## 2013-01-04 NOTE — Progress Notes (Signed)
The patient was examined and preop studies reviewed. There has been no change from the prior exam and the patient is ready for surgery.  Plan cabg today on J Ihde

## 2013-01-04 NOTE — Anesthesia Procedure Notes (Signed)
Procedures

## 2013-01-04 NOTE — Anesthesia Preprocedure Evaluation (Addendum)
Anesthesia Evaluation  Patient identified by MRN, date of birth, ID band Patient awake    Reviewed: Allergy & Precautions, H&P , NPO status , Patient's Chart, lab work & pertinent test results, reviewed documented beta blocker date and time   History of Anesthesia Complications Negative for: history of anesthetic complications  Airway Mallampati: II TM Distance: >3 FB Neck ROM: Full    Dental  (+) Teeth Intact and Dental Advisory Given   Pulmonary neg pulmonary ROS,    Pulmonary exam normal       Cardiovascular hypertension, Pt. on medications and Pt. on home beta blockers + angina + CAD, + Past MI and + Cardiac Stents     Neuro/Psych TIAnegative psych ROS   GI/Hepatic Neg liver ROS, GERD-  Controlled,  Endo/Other  diabetes, Well Controlled  Renal/GU Renal InsufficiencyRenal disease     Musculoskeletal   Abdominal   Peds  Hematology negative hematology ROS (+)   Anesthesia Other Findings   Reproductive/Obstetrics                         Anesthesia Physical Anesthesia Plan  ASA: IV  Anesthesia Plan: General   Post-op Pain Management:    Induction: Intravenous  Airway Management Planned: Oral ETT  Additional Equipment: Arterial line, 3D TEE and PA Cath  Intra-op Plan:   Post-operative Plan: Post-operative intubation/ventilation  Informed Consent: I have reviewed the patients History and Physical, chart, labs and discussed the procedure including the risks, benefits and alternatives for the proposed anesthesia with the patient or authorized representative who has indicated his/her understanding and acceptance.   Dental advisory given  Plan Discussed with: CRNA, Anesthesiologist and Surgeon  Anesthesia Plan Comments:        Anesthesia Quick Evaluation

## 2013-01-04 NOTE — OR Nursing (Signed)
2nd call SICU charge [phone].

## 2013-01-04 NOTE — Transfer of Care (Signed)
Immediate Anesthesia Transfer of Care Note  Patient: [name]  Procedure(s) Performed: Procedure(s) with comments: CORONARY ARTERY BYPASS GRAFTING (CABG) times two using right saphenous vein harvested with endoscope. (N/A) INTRAOPERATIVE TRANSESOPHAGEAL ECHOCARDIOGRAM (N/A) REDO STERNOTOMY (N/A) RADIAL ARTERY HARVEST (Left) - Artery not havested. Unsuitable for use.  Patient Location: SICU  Anesthesia Type:General  Level of Consciousness: responds to stimulation and Patient remains intubated per anesthesia plan  Airway & Oxygen Therapy: Patient remains intubated per anesthesia plan and Patient placed on Ventilator (see vital sign flow sheet for setting)  Post-op Assessment: Report given to PACU RN and Post -op Vital signs reviewed and stable  Post vital signs: Reviewed and stable  Complications: No apparent anesthesia complications

## 2013-01-04 NOTE — Progress Notes (Signed)
  Echocardiogram Echocardiogram Transesophageal has been performed.  Georgian Co 01/04/2013, 8:24 AM

## 2013-01-04 NOTE — Op Note (Signed)
NAMEMarland Kitchen  [name], [name] NO.:  [id number]  MEDICAL RECORD NO.:  [id number]  LOCATION:  2307                         FACILITY:  [facility]  PHYSICIAN:  Kerin Perna, M.D.  DATE OF BIRTH:  10/05/1950  DATE OF PROCEDURE:  01/04/2013 DATE OF DISCHARGE:                              OPERATIVE REPORT   OPERATION: 1. Redo sternotomy. 2. Coronary artery bypass grafting x2 (saphenous vein graft to     posterior descending, saphenous vein graft to circumflex marginal). 3. Endoscopic harvest of right leg greater saphenous vein. 4. Exposure of left radial artery which was not used due to severe     calcified atherosclerotic disease. 5. Exposure of right internal mammary artery, which was not used due     to  sclerosis from high-dose chest wall radiation in the past.  SURGEON:  Kerin Perna, MD  ASSISTANT:  Pauline Good, PA-C  PREOPERATIVE DIAGNOSES:  Unstable angina with severe multivessel coronary artery disease, status post prior sternotomy for resection of mediastinal seminoma with 30 days high-dose chest wall radiation.  POSTOPERATIVE DIAGNOSES:  Unstable angina with severe multivessel coronary artery disease, status post prior sternotomy for resection of mediastinal seminoma with 30 days high-dose chest wall radiation.  ANESTHESIOLOGIST:  Quita Skye. Krista Blue, MD.  OPERATIVE FINDINGS: 1. Severe mediastinal scarring from previous mediastinal dissection     and chest wall radiation for seminoma. 2. Poor quality left radial artery, which was exposed and not used due     to atherosclerotic calcification 3. Poor quality right internal mammary artery from sclerosis and     scarring from chest wall radiation which was not used.  OPERATIVE PROCEDURE:  The patient was brought to the operating room and placed supine on the operating table.  General anesthesia was induced under invasive hemodynamic monitoring.  The chest, abdomen, and legs were prepped with Betadine and  draped as a sterile field.  A proper time- out was performed.  A transesophageal 2D echo probe was placed by the anesthesiologist which demonstrated mild aortic insufficiency and an ejection fraction of 50%.  A sternal incision was performed as the right leg greater saphenous vein was harvested endoscopically.  The left arm had been prepped and draped to be used for radial artery harvest if needed.  The sternotomy was performed, however, with traction of the size of the sternal bone, innominate vein poor and bleeding was controlled with a pledgeted Prolene suture.  The mediastinum was then carefully dissected to allow retraction of the halves of the sternal bone.  The pericardium had not been entered during the previous mediastinal dissection for resection of seminoma.  The pericardium was opened and the heart was exposed.  There were some changes in the ascending aorta from radiation with thickening, but no dense adhesions.  The right side of the sternum was elevated for exposure of the internal mammary artery.  The internal mammary artery was not well visualized, but was encased in a thick layer of scar from previous chest wall radiation.  The internal mammary artery was not harvested.  Decision was then made to harvest the left radial artery.  An incision was made from the wrist to  the elbow and the soft tissue was dissected down to expose the radial artery.  Radial artery over most of its length had severe calcified atherosclerotic changes.  It did have a pulse but the vessel itself was of poor quality and the vessel was not harvested and the incision was closed in layers using running Vicryl.  The arm was then dressed with a sterile dressing and tucked to the side.  The decision was then made to perform the 2 bypass grafts to the circumflex and right coronary artery using vein.  It was felt that the left internal mammary artery was also scarred from the radiation as the proximal  portion of it was visualized in dissecting out the mediastinum.  The pericardium was suspended as a pericardial cradle.  Pursestrings were placed in the ascending aorta and right atrium and heparin was administered.  When the ACT was documented as being therapeutic, the patient was cannulated and placed on cardiopulmonary bypass.  The coronaries were identified for grafting.  The LAD did not have any significant disease, although there was some calcification in the proximal vessel.  The distal right (PDA) and the distal circumflex were found to be adequate targets.  Cardioplegia cannulas were placed for both antegrade and retrograde cold blood cardioplegia and the patient was cooled to 32 degrees.  The patient was transitioned to cardiopulmonary bypass without any instability.  The vein was prepared for the distal anastomoses and the crossclamp was applied.  One liter of cold blood cardioplegia was delivered in split doses between the antegrade aortic and retrograde coronary sinus catheters.  The cardioplegia resulted in cardioplegic arrest with septal temperature dropping less than 15 degrees.  Cardioplegia was delivered every 20 minutes or less while the crossclamp was in place.  The first distal anastomosis was to the posterior descending branch of right coronary.  It was almost totally occluded with 99% stenosis.  The posterior descending was 1.5-mm vessel.  A reverse saphenous vein was sewn end-to-side with running 7-0 Prolene with good flow through the graft.  Cardioplegia was redosed.  The second distal anastomosis was to the distal circumflex.  This had a proximal 90% stenosis at the site of the prior stent.  A reverse saphenous vein was sewn end-to-side with running 7-0 Prolene with excellent flow and cardioplegia was delivered through the graft.  A dose of retrograde cold blood cardioplegia was delivered as the 2 proximal anastomoses were constructed on the ascending aorta  with a 4.5 mm punch with running 6-0 Prolene.  Air was vented from the coronaries with a dose of retrograde warm blood cardioplegia as the final anastomosis was tied.  The crossclamp was removed and the heart resumed a spontaneous rhythm.  The patient was rewarmed.  The vein grafts were de-aired and each were checked and had good flow with hemostasis documented at the proximal distal anastomoses.  Temporary pacing wires were applied.  The lungs were expanded.  The pleural spaces were drained of any blood or fluid. The patient was started on low-dose dopamine.  The patient was then weaned from cardiopulmonary bypass.  A 2D echo showed good global LV function.  Hemodynamics were stable.  Protamine was administered without adverse reaction.  There was still diffuse oozing from the preoperative Plavix medication the patient had been taking and for that reason, a unit of platelet was given with improved coagulation function.  The mediastinum was irrigated with warm saline.  The superior pericardial tissue was closed over the aorta.  An anterior  mediastinal and left pleural chest tube were placed and brought out through separate incisions.  The sternum was closed with interrupted wire.  The pectoralis fascia was closed with a running #1 Vicryl.  The subcutaneous and skin layers were closed in running Vicryl and sterile dressings were applied.  Total cardiopulmonary bypass time was 90 minutes.     Kerin Perna, M.D.     PV/MEDQ  D:  01/04/2013  T:  01/04/2013  Job:  [id number]  cc:   Armanda Magic, M.D.

## 2013-01-05 ENCOUNTER — Inpatient Hospital Stay (HOSPITAL_COMMUNITY): Payer: 59

## 2013-01-05 LAB — POCT I-STAT, CHEM 8
Calcium, Ion: 1.2 mmol/L (ref 1.13–1.30)
HCT: 32 % — ABNORMAL LOW (ref 39.0–52.0)
TCO2: 21 mmol/L (ref 0–100)

## 2013-01-05 LAB — CBC
HCT: 28.5 % — ABNORMAL LOW (ref 39.0–52.0)
Hemoglobin: 9.7 g/dL — ABNORMAL LOW (ref 13.0–17.0)
Hemoglobin: 9.9 g/dL — ABNORMAL LOW (ref 13.0–17.0)
MCH: 25.4 pg — ABNORMAL LOW (ref 26.0–34.0)
MCHC: 33.7 g/dL (ref 30.0–36.0)
MCV: 75.4 fL — ABNORMAL LOW (ref 78.0–100.0)
RBC: 3.79 MIL/uL — ABNORMAL LOW (ref 4.22–5.81)
RBC: 3.9 MIL/uL — ABNORMAL LOW (ref 4.22–5.81)
WBC: 6.5 10*3/uL (ref 4.0–10.5)

## 2013-01-05 LAB — GLUCOSE, CAPILLARY
Glucose-Capillary: 157 mg/dL — ABNORMAL HIGH (ref 70–99)
Glucose-Capillary: 158 mg/dL — ABNORMAL HIGH (ref 70–99)
Glucose-Capillary: 151 mg/dL — ABNORMAL HIGH (ref 70–99)
Glucose-Capillary: 136 mg/dL — ABNORMAL HIGH (ref 70–99)
Glucose-Capillary: 117 mg/dL — ABNORMAL HIGH (ref 70–99)
Glucose-Capillary: 90 mg/dL (ref 70–99)
Glucose-Capillary: 156 mg/dL — ABNORMAL HIGH (ref 70–99)
Glucose-Capillary: 133 mg/dL — ABNORMAL HIGH (ref 70–99)
Glucose-Capillary: 112 mg/dL — ABNORMAL HIGH (ref 70–99)

## 2013-01-05 LAB — PREPARE PLATELET PHERESIS

## 2013-01-05 LAB — POCT I-STAT 3, ART BLOOD GAS (G3+)
Bicarbonate: 21.7 mEq/L (ref 20.0–24.0)
TCO2: 23 mmol/L (ref 0–100)
pCO2 arterial: 41.9 mmHg (ref 35.0–45.0)
pH, Arterial: 7.326 — ABNORMAL LOW (ref 7.350–7.450)
pO2, Arterial: 100 mmHg (ref 80.0–100.0)

## 2013-01-05 LAB — HIV ANTIBODY (ROUTINE TESTING W REFLEX): HIV: NONREACTIVE

## 2013-01-05 LAB — PREPARE FRESH FROZEN PLASMA: Unit division: 0

## 2013-01-05 LAB — BASIC METABOLIC PANEL
BUN: 11 mg/dL (ref 6–23)
Chloride: 109 mEq/L (ref 96–112)
Glucose, Bld: 74 mg/dL (ref 70–99)
Potassium: 4.3 mEq/L (ref 3.5–5.1)
Sodium: 140 mEq/L (ref 135–145)

## 2013-01-05 MED ORDER — ENOXAPARIN SODIUM 40 MG/0.4ML ~~LOC~~ SOLN
40.0000 mg | Freq: Every day | SUBCUTANEOUS | Status: DC
  Administered 2013-01-05 – 2013-01-07 (×3): 40 mg via SUBCUTANEOUS
  Filled 2013-01-05 (×4): qty 0.4

## 2013-01-05 MED ORDER — KETOROLAC TROMETHAMINE 15 MG/ML IJ SOLN
15.0000 mg | Freq: Four times a day (QID) | INTRAMUSCULAR | Status: DC | PRN
  Administered 2013-01-05: 15 mg via INTRAVENOUS
  Filled 2013-01-05: qty 1

## 2013-01-05 MED ORDER — METOPROLOL TARTRATE 25 MG/10 ML ORAL SUSPENSION
12.5000 mg | Freq: Two times a day (BID) | ORAL | Status: DC
  Administered 2013-01-06: 12.5 mg
  Filled 2013-01-05 (×11): qty 5

## 2013-01-05 MED ORDER — ZOLPIDEM TARTRATE 5 MG PO TABS
10.0000 mg | ORAL_TABLET | Freq: Every evening | ORAL | Status: DC | PRN
  Administered 2013-01-06 – 2013-01-09 (×4): 10 mg via ORAL
  Filled 2013-01-05 (×5): qty 2

## 2013-01-05 MED ORDER — FUROSEMIDE 10 MG/ML IJ SOLN
40.0000 mg | Freq: Once | INTRAMUSCULAR | Status: AC
  Administered 2013-01-05: 40 mg via INTRAVENOUS
  Filled 2013-01-05: qty 4

## 2013-01-05 MED ORDER — METOPROLOL TARTRATE 25 MG PO TABS
25.0000 mg | ORAL_TABLET | Freq: Two times a day (BID) | ORAL | Status: DC
  Administered 2013-01-05 – 2013-01-09 (×8): 25 mg via ORAL
  Filled 2013-01-05 (×12): qty 1

## 2013-01-05 MED ORDER — INSULIN ASPART 100 UNIT/ML ~~LOC~~ SOLN
4.0000 [IU] | Freq: Three times a day (TID) | SUBCUTANEOUS | Status: DC
  Administered 2013-01-05 – 2013-01-06 (×4): 4 [IU] via SUBCUTANEOUS

## 2013-01-05 MED ORDER — AMITRIPTYLINE HCL 100 MG PO TABS
100.0000 mg | ORAL_TABLET | Freq: Every day | ORAL | Status: DC
  Administered 2013-01-05 – 2013-01-09 (×5): 100 mg via ORAL
  Filled 2013-01-05 (×6): qty 1

## 2013-01-05 MED ORDER — INSULIN ASPART 100 UNIT/ML ~~LOC~~ SOLN
0.0000 [IU] | SUBCUTANEOUS | Status: DC
  Administered 2013-01-05: 8 [IU] via SUBCUTANEOUS
  Administered 2013-01-05 – 2013-01-06 (×2): 4 [IU] via SUBCUTANEOUS
  Administered 2013-01-06: 12 [IU] via SUBCUTANEOUS
  Administered 2013-01-06: 2 [IU] via SUBCUTANEOUS
  Administered 2013-01-06: 4 [IU] via SUBCUTANEOUS
  Administered 2013-01-06: 2 [IU] via SUBCUTANEOUS
  Administered 2013-01-07 (×2): 4 [IU] via SUBCUTANEOUS
  Administered 2013-01-07: 2 [IU] via SUBCUTANEOUS
  Administered 2013-01-07: 4 [IU] via SUBCUTANEOUS
  Administered 2013-01-07 – 2013-01-08 (×3): 2 [IU] via SUBCUTANEOUS

## 2013-01-05 MED ORDER — INSULIN DETEMIR 100 UNIT/ML ~~LOC~~ SOLN
30.0000 [IU] | Freq: Every day | SUBCUTANEOUS | Status: DC
  Administered 2013-01-05 – 2013-01-06 (×2): 30 [IU] via SUBCUTANEOUS
  Filled 2013-01-05 (×2): qty 0.3

## 2013-01-05 NOTE — Progress Notes (Signed)
1 Day Post-Op Procedure(s) (LRB): CORONARY ARTERY BYPASS GRAFTING (CABG) times two using right saphenous vein harvested with endoscope. (N/A) INTRAOPERATIVE TRANSESOPHAGEAL ECHOCARDIOGRAM (N/A) REDO STERNOTOMY (N/A) RADIAL ARTERY HARVEST (Left) Subjective: sore  Objective: Vital signs in last 24 hours: Temp:  [95.5 F (35.3 C)-100 F (37.8 C)] 99.9 F (37.7 C) (05/24 1100) Pulse Rate:  [43-124] 109 (05/24 1100) Cardiac Rhythm:  [-] Sinus tachycardia (05/24 1000) Resp:  [9-33] 33 (05/24 1100) BP: (92-136)/(55-71) 127/70 mmHg (05/24 1100) SpO2:  [81 %-100 %] 100 % (05/24 1100) Arterial Line BP: (92-159)/(46-64) 159/64 mmHg (05/24 1100) FiO2 (%):  [40 %-50 %] 40 % (05/23 1638) Weight:  [88.4 kg (194 lb 14.2 oz)] 88.4 kg (194 lb 14.2 oz) (05/24 0515)  Hemodynamic parameters for last 24 hours: PAP: (23-47)/(12-28) 47/28 mmHg CO:  [4.8 L/min-6.6 L/min] 5.9 L/min CI:  [2.3 L/min/m2-3.2 L/min/m2] 2.9 L/min/m2  Intake/Output from previous day: 05/23 0701 - 05/24 0700 In: 8277 [I.V.:5041; WUJWJ:1914; IV Piggyback:1700] Out: 4590 [Urine:3950; Chest Tube:640] Intake/Output this shift: Total I/O In: 473.4 [I.V.:273.4; IV Piggyback:200] Out: 510 [Urine:360; Chest Tube:150]  General appearance: alert and cooperative Neurologic: intact Heart: regular rate and rhythm, S1, S2 normal, no murmur, click, rub or gallop Lungs: clear to auscultation bilaterally Extremities: edema mild Wound: dressing dry  Lab Results:  Recent Labs  01/04/13 2000 01/05/13 0400  WBC 8.1 6.5  HGB 9.7* 9.7*  HCT 28.8* 28.5*  PLT 134* 146*   BMET:  Recent Labs  01/04/13 0515  01/04/13 1949 01/04/13 2000 01/05/13 0400  NA 141  < > 141  --  140  K 3.5  < > 3.8  --  4.3  CL 105  --  110  --  109  CO2 28  --   --   --  21  GLUCOSE 109*  < > 152*  --  74  BUN 14  --  10  --  11  CREATININE 1.30  --  1.00 0.99 1.02  CALCIUM 9.5  --   --   --  8.8  < > = values in this interval not displayed.   PT/INR:  Recent Labs  01/04/13 1410  LABPROT 15.7*  INR 1.28   ABG    Component Value Date/Time   PHART 7.326* 01/05/2013 0519   HCO3 21.7 01/05/2013 0519   TCO2 23 01/05/2013 0519   ACIDBASEDEF 4.0* 01/05/2013 0519   O2SAT 97.0 01/05/2013 0519   CBG (last 3)   Recent Labs  01/05/13 0359 01/05/13 0509 01/05/13 0641  GLUCAP 71 84 117*   CXR: bibasilar atelectasis ECG: NSR, no acute changes  Assessment/Plan: S/P Procedure(s) (LRB): CORONARY ARTERY BYPASS GRAFTING (CABG) times two using right saphenous vein harvested with endoscope. (N/A) INTRAOPERATIVE TRANSESOPHAGEAL ECHOCARDIOGRAM (N/A) REDO STERNOTOMY (N/A) RADIAL ARTERY HARVEST (Left) Mobilize Diuresis Diabetes control: start some levemir and meal coverage in addition to SSI and get off insulin drip. Continue foley due to patient in ICU and urinary output monitoring See progression orders   LOS: 8 days    BARTLE,BRYAN K 01/05/2013

## 2013-01-05 NOTE — Progress Notes (Signed)
Spoke to pt. Chart reviewed. Post CABG. Redo sternotomy.

## 2013-01-05 NOTE — Progress Notes (Signed)
Patient ID: [name], male   DOB: 1951/04/13, 62 y.o.   MRN: [id number]   Hemodynamically stable  Urine output good.  CBC    Component Value Date/Time   WBC 7.9 01/05/2013 1650   RBC 3.90* 01/05/2013 1650   HGB 9.9* 01/05/2013 1650   HCT 29.4* 01/05/2013 1650   PLT 138* 01/05/2013 1650   MCV 75.4* 01/05/2013 1650   MCH 25.4* 01/05/2013 1650   MCHC 33.7 01/05/2013 1650   RDW 19.0* 01/05/2013 1650   LYMPHSABS 1.9 12/28/2012 2243   MONOABS 0.2 12/28/2012 2243   EOSABS 0.3 12/28/2012 2243   BASOSABS 0.0 12/28/2012 2243    BMET    Component Value Date/Time   NA 139 01/05/2013 1642   K 4.1 01/05/2013 1642   CL 105 01/05/2013 1642   CO2 21 01/05/2013 0400   GLUCOSE 180* 01/05/2013 1642   BUN 11 01/05/2013 1642   CREATININE 1.26 01/05/2013 1650   CALCIUM 8.8 01/05/2013 0400   GFRNONAA 59* 01/05/2013 1650   GFRAA 68* 01/05/2013 1650    Stable day. Wants to resume his Elavil and ambien for sleep.

## 2013-01-06 ENCOUNTER — Inpatient Hospital Stay (HOSPITAL_COMMUNITY): Payer: 59

## 2013-01-06 LAB — GLUCOSE, CAPILLARY
Glucose-Capillary: 159 mg/dL — ABNORMAL HIGH (ref 70–99)
Glucose-Capillary: 138 mg/dL — ABNORMAL HIGH (ref 70–99)
Glucose-Capillary: 268 mg/dL — ABNORMAL HIGH (ref 70–99)
Glucose-Capillary: 196 mg/dL — ABNORMAL HIGH (ref 70–99)
Glucose-Capillary: 161 mg/dL — ABNORMAL HIGH (ref 70–99)

## 2013-01-06 LAB — BASIC METABOLIC PANEL
Calcium: 8.8 mg/dL (ref 8.4–10.5)
GFR calc non Af Amer: 53 mL/min — ABNORMAL LOW (ref >90)
Glucose, Bld: 173 mg/dL — ABNORMAL HIGH (ref 70–99)
Sodium: 135 mEq/L (ref 135–145)

## 2013-01-06 LAB — CBC
MCH: 25.6 pg — ABNORMAL LOW (ref 26.0–34.0)
Platelets: 126 10*3/uL — ABNORMAL LOW (ref 150–400)
RBC: 3.55 MIL/uL — ABNORMAL LOW (ref 4.22–5.81)
RDW: 18.7 % — ABNORMAL HIGH (ref 11.5–15.5)
WBC: 7 10*3/uL (ref 4.0–10.5)

## 2013-01-06 LAB — HEPATITIS PANEL, ACUTE
HCV Ab: NEGATIVE
Hep A IgM: NEGATIVE
Hep B C IgM: NEGATIVE
Hepatitis B Surface Ag: NEGATIVE

## 2013-01-06 MED ORDER — INSULIN ASPART 100 UNIT/ML ~~LOC~~ SOLN
8.0000 [IU] | Freq: Three times a day (TID) | SUBCUTANEOUS | Status: DC
  Administered 2013-01-06 – 2013-01-10 (×9): 8 [IU] via SUBCUTANEOUS

## 2013-01-06 MED ORDER — INSULIN DETEMIR 100 UNIT/ML ~~LOC~~ SOLN
40.0000 [IU] | Freq: Every day | SUBCUTANEOUS | Status: DC
  Filled 2013-01-06: qty 0.4

## 2013-01-06 NOTE — Progress Notes (Signed)
Patient ID: [name], male   DOB: 1951/01/30, 62 y.o.   MRN: [id number]  SICU Evening Rounds:  Hemodynamically stable today  Diuresing well.

## 2013-01-06 NOTE — Progress Notes (Signed)
2 Days Post-Op Procedure(s) (LRB): CORONARY ARTERY BYPASS GRAFTING (CABG) times two using right saphenous vein harvested with endoscope. (N/A) INTRAOPERATIVE TRANSESOPHAGEAL ECHOCARDIOGRAM (N/A) REDO STERNOTOMY (N/A) RADIAL ARTERY HARVEST (Left) Subjective: No complaints  Objective: Vital signs in last 24 hours: Temp:  [98.1 F (36.7 C)-98.8 F (37.1 C)] 98.7 F (37.1 C) (05/25 0800) Pulse Rate:  [100-117] 101 (05/25 1100) Cardiac Rhythm:  [-] Sinus tachycardia (05/25 1000) Resp:  [26-44] 27 (05/25 1100) BP: (108-148)/(54-81) 113/65 mmHg (05/25 1100) SpO2:  [87 %-100 %] 100 % (05/25 1100) Weight:  [86.8 kg (191 lb 5.8 oz)] 86.8 kg (191 lb 5.8 oz) (05/25 0600)  Hemodynamic parameters for last 24 hours:    Intake/Output from previous day: 05/24 0701 - 05/25 0700 In: 1465.7 [P.O.:300; I.V.:665.7; IV Piggyback:500] Out: 2665 [Urine:2055; Chest Tube:610] Intake/Output this shift: Total I/O In: 270 [P.O.:250; I.V.:20] Out: 550 [Urine:500; Chest Tube:50]  General appearance: alert and cooperative Neurologic: intact Heart: regular rate and rhythm, S1, S2 normal, no murmur, click, rub or gallop Lungs: diminished breath sounds bibasilar Extremities: extremities normal, atraumatic, no cyanosis or edema Wound: incision ok  Lab Results:  Recent Labs  01/05/13 1650 01/06/13 0409  WBC 7.9 7.0  HGB 9.9* 9.1*  HCT 29.4* 26.7*  PLT 138* 126*   BMET:  Recent Labs  01/05/13 0400 01/05/13 1642 01/05/13 1650 01/06/13 0409  NA 140 139  --  135  K 4.3 4.1  --  3.8  CL 109 105  --  102  CO2 21  --   --  23  GLUCOSE 74 180*  --  173*  BUN 11 11  --  15  CREATININE 1.02 1.20 1.26 1.38*  CALCIUM 8.8  --   --  8.8    PT/INR:  Recent Labs  01/04/13 1410  LABPROT 15.7*  INR 1.28   ABG    Component Value Date/Time   PHART 7.326* 01/05/2013 0519   HCO3 21.7 01/05/2013 0519   TCO2 21 01/05/2013 1642   ACIDBASEDEF 4.0* 01/05/2013 0519   O2SAT 97.0 01/05/2013 0519   CBG  (last 3)   Recent Labs  01/05/13 2245 01/06/13 0407 01/06/13 0756  GLUCAP 168* 159* 138*   CXR: bibasilar atelectasis, low lung volumes  Assessment/Plan: S/P Procedure(s) (LRB): CORONARY ARTERY BYPASS GRAFTING (CABG) times two using right saphenous vein harvested with endoscope. (N/A) INTRAOPERATIVE TRANSESOPHAGEAL ECHOCARDIOGRAM (N/A) REDO STERNOTOMY (N/A) RADIAL ARTERY HARVEST (Left)  He remains hemodynamically stable. Wt. Is below preop Chest tube output is still significant so will leave tubes in today. Mobilize and IS. Diabetes under adequate control   LOS: 9 days    Kayra Crowell K 01/06/2013

## 2013-01-07 ENCOUNTER — Inpatient Hospital Stay (HOSPITAL_COMMUNITY): Payer: 59

## 2013-01-07 LAB — GLUCOSE, CAPILLARY
Glucose-Capillary: 179 mg/dL — ABNORMAL HIGH (ref 70–99)
Glucose-Capillary: 132 mg/dL — ABNORMAL HIGH (ref 70–99)
Glucose-Capillary: 112 mg/dL — ABNORMAL HIGH (ref 70–99)
Glucose-Capillary: 173 mg/dL — ABNORMAL HIGH (ref 70–99)

## 2013-01-07 LAB — BASIC METABOLIC PANEL
Calcium: 8.6 mg/dL (ref 8.4–10.5)
GFR calc Af Amer: 70 mL/min — ABNORMAL LOW (ref >90)
GFR calc non Af Amer: 60 mL/min — ABNORMAL LOW (ref >90)
Glucose, Bld: 145 mg/dL — ABNORMAL HIGH (ref 70–99)
Potassium: 3.8 mEq/L (ref 3.5–5.1)
Sodium: 140 mEq/L (ref 135–145)

## 2013-01-07 LAB — TYPE AND SCREEN
ABO/RH(D): O POS
Antibody Screen: NEGATIVE
Unit division: 0
Unit division: 0

## 2013-01-07 LAB — CBC
HCT: 24.6 % — ABNORMAL LOW (ref 39.0–52.0)
MCHC: 35 g/dL (ref 30.0–36.0)
Platelets: 114 10*3/uL — ABNORMAL LOW (ref 150–400)
RDW: 18.8 % — ABNORMAL HIGH (ref 11.5–15.5)
WBC: 5.7 10*3/uL (ref 4.0–10.5)

## 2013-01-07 MED ORDER — INSULIN DETEMIR 100 UNIT/ML ~~LOC~~ SOLN
30.0000 [IU] | Freq: Every day | SUBCUTANEOUS | Status: DC
  Administered 2013-01-07 – 2013-01-08 (×2): 30 [IU] via SUBCUTANEOUS
  Filled 2013-01-07 (×2): qty 0.3

## 2013-01-07 MED ORDER — METFORMIN HCL 500 MG PO TABS
1000.0000 mg | ORAL_TABLET | Freq: Two times a day (BID) | ORAL | Status: DC
  Administered 2013-01-07 – 2013-01-08 (×3): 1000 mg via ORAL
  Filled 2013-01-07 (×5): qty 2

## 2013-01-07 NOTE — Progress Notes (Signed)
Reported off care to Brett Canales, [place].  No acute distress noted. VSS. Safety maintained.

## 2013-01-07 NOTE — Progress Notes (Signed)
Patient ID: [name], male   DOB: 1950/11/23, 62 y.o.   MRN: [id number]  SICU Evening Rounds:  Hemodynamically stable  CT output 85cc since this am.  Walking well.   Can probably remove tube tomorrow if drainage remains low.

## 2013-01-07 NOTE — Progress Notes (Addendum)
3 Days Post-Op Procedure(s) (LRB): CORONARY ARTERY BYPASS GRAFTING (CABG) times two using right saphenous vein harvested with endoscope. (N/A) INTRAOPERATIVE TRANSESOPHAGEAL ECHOCARDIOGRAM (N/A) REDO STERNOTOMY (N/A) RADIAL ARTERY HARVEST (Left) Subjective:  No complaints. Ambulated this am for the first time.  Objective: Vital signs in last 24 hours: Temp:  [98.1 F (36.7 C)-99.2 F (37.3 C)] 98.3 F (36.8 C) (05/26 0743) Pulse Rate:  [93-119] 116 (05/26 0900) Cardiac Rhythm:  [-] Sinus tachycardia (05/26 0900) Resp:  [6-36] 36 (05/26 0900) BP: (105-150)/(53-105) 122/57 mmHg (05/26 0900) SpO2:  [93 %-100 %] 100 % (05/26 0900) Weight:  [85.5 kg (188 lb 7.9 oz)] 85.5 kg (188 lb 7.9 oz) (05/26 0600)  Hemodynamic parameters for last 24 hours:    Intake/Output from previous day: 05/25 0701 - 05/26 0700 In: 590 [P.O.:570; I.V.:20] Out: 2575 [Urine:2225; Chest Tube:350] Intake/Output this shift: Total I/O In: 240 [P.O.:240] Out: 200 [Chest Tube:200]  General appearance: alert and cooperative Neurologic: intact Heart: regular rate and rhythm, S1, S2 normal, no murmur, click, rub or gallop Lungs: clear to auscultation bilaterally Extremities: extremities normal, atraumatic, no cyanosis or edema Wound: incision ok  Lab Results:  Recent Labs  01/06/13 0409 01/07/13 0412  WBC 7.0 5.7  HGB 9.1* 8.6*  HCT 26.7* 24.6*  PLT 126* 114*   BMET:  Recent Labs  01/06/13 0409 01/07/13 0412  NA 135 140  K 3.8 3.8  CL 102 107  CO2 23 23  GLUCOSE 173* 145*  BUN 15 15  CREATININE 1.38* 1.24  CALCIUM 8.8 8.6    PT/INR:  Recent Labs  01/04/13 1410  LABPROT 15.7*  INR 1.28   ABG    Component Value Date/Time   PHART 7.326* 01/05/2013 0519   HCO3 21.7 01/05/2013 0519   TCO2 21 01/05/2013 1642   ACIDBASEDEF 4.0* 01/05/2013 0519   O2SAT 97.0 01/05/2013 0519   CBG (last 3)   Recent Labs  01/07/13 0102 01/07/13 0404 01/07/13 0740  GLUCAP 179* 132* 112*   *RADIOLOGY  REPORT*   Clinical Data: Post CABG   PORTABLE CHEST - 1 VIEW   Comparison: 01/06/2013; 01/05/2013; 01/04/2013   Findings:   Grossly unchanged and borderline enlarged cardiac silhouette and mediastinal contours post median sternotomy and CABG.  Stable positioning of support apparatus.  Interval reduction in tiny residual left apical hydropneumothorax.  Lung volumes remain persistent reduced with grossly unchanged bibasilar heterogeneous opacities.  Grossly unchanged small bilateral pleural effusions. Mild pulmonary congestion without frank evidence of edema. Unchanged bones.   IMPRESSION: 1.  Stable positioning of support apparatus.  Interval reduction in now tiny left apical hydropneumothorax. 2.  Unchanged small bilateral effusions and associated bibasilar opacities, atelectasis versus infiltrate. 3.  Pulmonary venous congestion without frank evidence of edema.     Original Report Authenticated By: Tacey Ruiz, MD  Assessment/Plan: S/P Procedure(s) (LRB): CORONARY ARTERY BYPASS GRAFTING (CABG) times two using right saphenous vein harvested with endoscope. (N/A) INTRAOPERATIVE TRANSESOPHAGEAL ECHOCARDIOGRAM (N/A) REDO STERNOTOMY (N/A) RADIAL ARTERY HARVEST (Left) Mobilize Left pleural tube drained 380 cc yesterday and 200 so far today so will leave it in today. Diabetes is under adequate control. Will resume glucophage since creat ok and decrease Levemir to 30.   LOS: 10 days    Virgil Lightner K 01/07/2013

## 2013-01-08 ENCOUNTER — Encounter (HOSPITAL_COMMUNITY): Payer: Self-pay

## 2013-01-08 ENCOUNTER — Inpatient Hospital Stay (HOSPITAL_COMMUNITY): Payer: 59

## 2013-01-08 LAB — GLUCOSE, CAPILLARY
Glucose-Capillary: 180 mg/dL — ABNORMAL HIGH (ref 70–99)
Glucose-Capillary: 61 mg/dL — ABNORMAL LOW (ref 70–99)
Glucose-Capillary: 75 mg/dL (ref 70–99)
Glucose-Capillary: 148 mg/dL — ABNORMAL HIGH (ref 70–99)

## 2013-01-08 MED ORDER — INSULIN ASPART 100 UNIT/ML ~~LOC~~ SOLN
0.0000 [IU] | Freq: Three times a day (TID) | SUBCUTANEOUS | Status: DC
  Administered 2013-01-09: 4 [IU] via SUBCUTANEOUS

## 2013-01-08 MED ORDER — FE FUMARATE-B12-VIT C-FA-IFC PO CAPS
1.0000 | ORAL_CAPSULE | Freq: Three times a day (TID) | ORAL | Status: DC
  Administered 2013-01-08 – 2013-01-10 (×7): 1 via ORAL
  Filled 2013-01-08 (×10): qty 1

## 2013-01-08 MED ORDER — SODIUM CHLORIDE 0.9 % IJ SOLN
3.0000 mL | Freq: Two times a day (BID) | INTRAMUSCULAR | Status: DC
  Administered 2013-01-08 – 2013-01-09 (×3): 3 mL via INTRAVENOUS

## 2013-01-08 MED ORDER — POTASSIUM CHLORIDE CRYS ER 20 MEQ PO TBCR
20.0000 meq | EXTENDED_RELEASE_TABLET | Freq: Every day | ORAL | Status: DC
  Administered 2013-01-08 – 2013-01-10 (×3): 20 meq via ORAL
  Filled 2013-01-08 (×3): qty 1

## 2013-01-08 MED ORDER — METFORMIN HCL 500 MG PO TABS
1000.0000 mg | ORAL_TABLET | Freq: Two times a day (BID) | ORAL | Status: DC
  Administered 2013-01-09 – 2013-01-10 (×3): 1000 mg via ORAL
  Filled 2013-01-08 (×5): qty 2

## 2013-01-08 MED ORDER — SODIUM CHLORIDE 0.9 % IJ SOLN: 3.0000 mL | INTRAMUSCULAR | Status: DC | PRN

## 2013-01-08 MED ORDER — MOVING RIGHT ALONG BOOK
Freq: Once | Status: AC
  Administered 2013-01-08: 13:00:00
  Filled 2013-01-08: qty 1

## 2013-01-08 MED ORDER — FUROSEMIDE 40 MG PO TABS
40.0000 mg | ORAL_TABLET | Freq: Every day | ORAL | Status: DC
  Administered 2013-01-08 – 2013-01-10 (×3): 40 mg via ORAL
  Filled 2013-01-08 (×3): qty 1

## 2013-01-08 MED ORDER — SODIUM CHLORIDE 0.9 % IV SOLN: 250.0000 mL | INTRAVENOUS | Status: DC | PRN

## 2013-01-08 MED ORDER — LEVALBUTEROL HCL 1.25 MG/0.5ML IN NEBU
1.2500 mg | INHALATION_SOLUTION | Freq: Four times a day (QID) | RESPIRATORY_TRACT | Status: DC | PRN
  Filled 2013-01-08: qty 0.5

## 2013-01-08 MED ORDER — SORBITOL 70 % SOLN
120.0000 mL | Freq: Every day | Status: DC | PRN
  Administered 2013-01-09: 120 mL via ORAL
  Filled 2013-01-08: qty 120

## 2013-01-08 MED FILL — Mannitol IV Soln 20%: INTRAVENOUS | Qty: 500 | Status: AC

## 2013-01-08 MED FILL — Sodium Chloride IV Soln 0.9%: INTRAVENOUS | Qty: 1000 | Status: AC

## 2013-01-08 MED FILL — Heparin Sodium (Porcine) Inj 1000 Unit/ML: INTRAMUSCULAR | Qty: 30 | Status: AC

## 2013-01-08 MED FILL — Heparin Sodium (Porcine) Inj 1000 Unit/ML: INTRAMUSCULAR | Qty: 10 | Status: AC

## 2013-01-08 MED FILL — Sodium Chloride Irrigation Soln 0.9%: Qty: 3000 | Status: AC

## 2013-01-08 MED FILL — Lidocaine HCl IV Inj 20 MG/ML: INTRAVENOUS | Qty: 10 | Status: AC

## 2013-01-08 MED FILL — Sodium Bicarbonate IV Soln 8.4%: INTRAVENOUS | Qty: 50 | Status: AC

## 2013-01-08 MED FILL — Electrolyte-R (PH 7.4) Solution: INTRAVENOUS | Qty: 5000 | Status: AC

## 2013-01-08 NOTE — Progress Notes (Signed)
4 Days Post-Op Procedure(s) (LRB): CORONARY ARTERY BYPASS GRAFTING (CABG) times two using right saphenous vein harvested with endoscope. (N/A) INTRAOPERATIVE TRANSESOPHAGEAL ECHOCARDIOGRAM (N/A) REDO STERNOTOMY (N/A) RADIAL ARTERY HARVEST (Left) Subjective: Doing well after CABG for unstable angina, redo  Persistent chest tube drainage from preop plavis, postop expected anemia C/o constipation  Objective: Vital signs in last 24 hours: Temp:  [98.1 F (36.7 C)-98.8 F (37.1 C)] 98.3 F (36.8 C) (05/27 0732) Pulse Rate:  [101-117] 108 (05/27 0800) Cardiac Rhythm:  [-] Sinus tachycardia (05/27 0800) Resp:  [12-39] 30 (05/27 0800) BP: (113-132)/(57-91) 132/69 mmHg (05/27 0800) SpO2:  [97 %-100 %] 100 % (05/27 0800) Weight:  [184 lb 15.5 oz (83.9 kg)] 184 lb 15.5 oz (83.9 kg) (05/27 0500)  Hemodynamic parameters for last 24 hours:   nsr  Intake/Output from previous day: 05/26 0701 - 05/27 0700 In: 680 [P.O.:680] Out: 2195 [Urine:1850; Chest Tube:345] Intake/Output this shift:    Incisions clean Lungs clear Lab Results:  Recent Labs  01/06/13 0409 01/07/13 0412  WBC 7.0 5.7  HGB 9.1* 8.6*  HCT 26.7* 24.6*  PLT 126* 114*   BMET:  Recent Labs  01/06/13 0409 01/07/13 0412  NA 135 140  K 3.8 3.8  CL 102 107  CO2 23 23  GLUCOSE 173* 145*  BUN 15 15  CREATININE 1.38* 1.24  CALCIUM 8.8 8.6    PT/INR: No results found for this basename: LABPROT, INR,  in the last 72 hours ABG    Component Value Date/Time   PHART 7.326* 01/05/2013 0519   HCO3 21.7 01/05/2013 0519   TCO2 21 01/05/2013 1642   ACIDBASEDEF 4.0* 01/05/2013 0519   O2SAT 97.0 01/05/2013 0519   CBG (last 3)   Recent Labs  01/07/13 2353 01/08/13 0403 01/08/13 0730  GLUCAP 90 135* 145*    Assessment/Plan: S/P Procedure(s) (LRB): CORONARY ARTERY BYPASS GRAFTING (CABG) times two using right saphenous vein harvested with endoscope. (N/A) INTRAOPERATIVE TRANSESOPHAGEAL ECHOCARDIOGRAM (N/A) REDO  STERNOTOMY (N/A) RADIAL ARTERY HARVEST (Left) Plan for transfer to step-down: see transfer orders Leave L pleural tube for persistent drainage- 70cc per 12h  LOS: 11 days    VAN TRIGT III,Taje Tondreau 01/08/2013

## 2013-01-09 ENCOUNTER — Inpatient Hospital Stay (HOSPITAL_COMMUNITY): Payer: 59

## 2013-01-09 ENCOUNTER — Encounter (HOSPITAL_COMMUNITY): Payer: Self-pay | Admitting: Cardiothoracic Surgery

## 2013-01-09 ENCOUNTER — Encounter: Payer: Self-pay | Admitting: Thoracic Surgery (Cardiothoracic Vascular Surgery)

## 2013-01-09 LAB — CBC
HCT: 25.4 % — ABNORMAL LOW (ref 39.0–52.0)
Hemoglobin: 8.7 g/dL — ABNORMAL LOW (ref 13.0–17.0)
MCH: 25.5 pg — ABNORMAL LOW (ref 26.0–34.0)
MCHC: 34.3 g/dL (ref 30.0–36.0)
MCV: 74.5 fL — ABNORMAL LOW (ref 78.0–100.0)
Platelets: 189 10*3/uL (ref 150–400)
RBC: 3.41 MIL/uL — ABNORMAL LOW (ref 4.22–5.81)
RDW: 17.9 % — ABNORMAL HIGH (ref 11.5–15.5)
WBC: 5.2 10*3/uL (ref 4.0–10.5)

## 2013-01-09 LAB — BASIC METABOLIC PANEL
BUN: 18 mg/dL (ref 6–23)
CO2: 27 mEq/L (ref 19–32)
Calcium: 8.8 mg/dL (ref 8.4–10.5)
Chloride: 103 mEq/L (ref 96–112)
Creatinine, Ser: 1.29 mg/dL (ref 0.50–1.35)
GFR calc Af Amer: 67 mL/min — ABNORMAL LOW (ref >90)
GFR calc non Af Amer: 57 mL/min — ABNORMAL LOW (ref >90)
Glucose, Bld: 173 mg/dL — ABNORMAL HIGH (ref 70–99)
Potassium: 4.1 mEq/L (ref 3.5–5.1)
Sodium: 140 mEq/L (ref 135–145)

## 2013-01-09 LAB — POCT I-STAT 4, (NA,K, GLUC, HGB,HCT): HCT: 46 % (ref 39.0–52.0)

## 2013-01-09 LAB — GLUCOSE, CAPILLARY
Glucose-Capillary: 165 mg/dL — ABNORMAL HIGH (ref 70–99)
Glucose-Capillary: 163 mg/dL — ABNORMAL HIGH (ref 70–99)
Glucose-Capillary: 261 mg/dL — ABNORMAL HIGH (ref 70–99)

## 2013-01-09 MED ORDER — LACTULOSE 10 GM/15ML PO SOLN
20.0000 g | Freq: Once | ORAL | Status: AC
  Administered 2013-01-09: 20 g via ORAL
  Filled 2013-01-09: qty 30

## 2013-01-09 MED ORDER — LOSARTAN POTASSIUM 25 MG PO TABS
12.5000 mg | ORAL_TABLET | Freq: Every day | ORAL | Status: DC
  Administered 2013-01-09 – 2013-01-10 (×2): 12.5 mg via ORAL
  Filled 2013-01-09 (×2): qty 0.5

## 2013-01-09 MED ORDER — INSULIN ASPART 100 UNIT/ML ~~LOC~~ SOLN: 10.0000 [IU] | Freq: Every day | SUBCUTANEOUS | Status: DC

## 2013-01-09 MED ORDER — INSULIN DETEMIR 100 UNIT/ML ~~LOC~~ SOLN
10.0000 [IU] | Freq: Every day | SUBCUTANEOUS | Status: DC
  Administered 2013-01-09: 10 [IU] via SUBCUTANEOUS
  Filled 2013-01-09 (×2): qty 0.1

## 2013-01-09 MED ORDER — LOSARTAN POTASSIUM 25 MG PO TABS
25.0000 mg | ORAL_TABLET | Freq: Every day | ORAL | Status: DC
  Filled 2013-01-09: qty 1

## 2013-01-09 NOTE — Progress Notes (Signed)
CARDIAC REHAB PHASE I   PRE:  Rate/Rhythm: 101ST  BP:  Supine:   Sitting: 120/54  Standing:    SaO2: 93%RA  MODE:  Ambulation: 550 ft   POST:  Rate/Rhythm: 107ST  BP:  Supine:   Sitting: 108/69  Standing:    SaO2: 94%RA 1010-1034 Pt walked 550 ft on RA with rolling walker and asst x 1 with steady gait. Do not think pt will need walker once chest tube out. Tolerated well. To bed after walk. Slightly SOB but sats good.   Luetta Nutting, RN BSN  01/09/2013 10:30 AM
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
## 2013-01-09 NOTE — Progress Notes (Signed)
Pt amb 500 ft with no assistance. Pt reports no complaints. Pt returned to room with call bell in reach. Will continue to monitor pt closely.

## 2013-01-09 NOTE — Progress Notes (Addendum)
                   [address]            [place] [address]          [phone]      5 Days Post-Op Procedure(s) (LRB): CORONARY ARTERY BYPASS GRAFTING (CABG) times two using right saphenous vein harvested with endoscope. (N/A) INTRAOPERATIVE TRANSESOPHAGEAL ECHOCARDIOGRAM (N/A) REDO STERNOTOMY (N/A) RADIAL ARTERY HARVEST (Left)  Subjective: Patient with complaints of constipation. He hopes to get chest tube out today.  Objective: Vital signs in last 24 hours: Temp:  [98.3 F (36.8 C)-99 F (37.2 C)] 98.3 F (36.8 C) (05/28 0524) Pulse Rate:  [97-112] 105 (05/28 0524) Cardiac Rhythm:  [-] Sinus tachycardia (05/27 2045) Resp:  [20-30] 20 (05/28 0524) BP: (105-136)/(62-75) 132/72 mmHg (05/28 0524) SpO2:  [95 %-100 %] 95 % (05/28 0524) Weight:  [83.2 kg (183 lb 6.8 oz)] 83.2 kg (183 lb 6.8 oz) (05/28 0524)  Pre op weight  87 kg Current Weight  01/09/13 83.2 kg (183 lb 6.8 oz)      Intake/Output from previous day: 05/27 0701 - 05/28 0700 In: 1440 [P.O.:1440] Out: 1470 [Urine:1350; Chest Tube:120]   Physical Exam:  Cardiovascular: RRR Pulmonary: Slightly diminished at bases; no rales, wheezes, or rhonchi. Abdomen: Soft, non tender, bowel sounds present. Extremities: Trace bilateral lower extremity edema. Wounds: Clean and dry.  No erythema or signs of infection.  Lab Results: CBC: Recent Labs  01/07/13 0412 01/09/13 0420  WBC 5.7 5.2  HGB 8.6* 8.7*  HCT 24.6* 25.4*  PLT 114* 189   BMET:  Recent Labs  01/07/13 0412 01/09/13 0420  NA 140 140  K 3.8 4.1  CL 107 103  CO2 23 27  GLUCOSE 145* 173*  BUN 15 18  CREATININE 1.24 1.29  CALCIUM 8.6 8.8    PT/INR:  Lab Results  Component Value Date   INR 1.28 01/04/2013   INR 0.98 01/03/2013   INR 0.93 05/16/2012   ABG:  INR: Will add last result for INR, ABG once components are confirmed Will add last 4 CBG results once components are confirmed  Assessment/Plan:  1. CV - SR/ST.  On Lopressor 25 bid.Will restart low dose Cozaar for better blood pressure control and monitor creatinine.Give Lopressor now. 2.  Pulmonary -Chest tube output 120 cc last 24 hours. Chest tube is to water seal and there is no air leak. Will remove.CXR this am appears to show stable trace left apical pneumothorax, small bilateral pleural effusions, and bibasilar atelectasis 3. Volume Overload - On daily Lasix. Is below pre op weight. Will likely not need at discharge 4.  Acute blood loss anemia - H and H stable at 8.7 and 25.4 On Trinsicon tid. 5.DM-CBGs 75/148/165. On Metformin 1000 bid, Insulin 8 units tid. Was on Insulin 40 at hs pre op and 6-14 units before meals. Pre op HGA1C 8.5. Will need follow up as an outpatient 6.Remove EPW in am 7.LOC constipation 8.Possibly discharge 1-2 days  ZIMMERMAN,DONIELLE MPA-C 01/09/2013,7:41 AM  patient examined and medical record reviewed,agree with above note. VAN TRIGT III,Sharbel Sahagun 01/09/2013

## 2013-01-09 NOTE — Progress Notes (Signed)
Sorbitol given per MD request if no BM by 6pm. Will continue to monitor pt closely.

## 2013-01-09 NOTE — Progress Notes (Signed)
Lt CT D/C per order. Occlusive dsg applied. Pt tolerated procedure very well. Pt instructed to call RN if any discomfort or SOB occurs. Pt verbalized understanding. Will continue to monitor pt closely.

## 2013-01-10 ENCOUNTER — Inpatient Hospital Stay (HOSPITAL_COMMUNITY): Payer: 59

## 2013-01-10 ENCOUNTER — Encounter (HOSPITAL_COMMUNITY): Payer: Self-pay

## 2013-01-10 LAB — GLUCOSE, CAPILLARY
Glucose-Capillary: 162 mg/dL — ABNORMAL HIGH (ref 70–99)
Glucose-Capillary: 190 mg/dL — ABNORMAL HIGH (ref 70–99)

## 2013-01-10 LAB — BASIC METABOLIC PANEL
GFR calc non Af Amer: 53 mL/min — ABNORMAL LOW (ref >90)
Glucose, Bld: 176 mg/dL — ABNORMAL HIGH (ref 70–99)
Potassium: 4 mEq/L (ref 3.5–5.1)
Sodium: 140 mEq/L (ref 135–145)

## 2013-01-10 MED ORDER — METOPROLOL TARTRATE 50 MG PO TABS
50.0000 mg | ORAL_TABLET | Freq: Two times a day (BID) | ORAL | Status: DC
  Administered 2013-01-10: 50 mg via ORAL
  Filled 2013-01-10 (×2): qty 1

## 2013-01-10 MED ORDER — OXYCODONE HCL 5 MG PO TABS: 5.0000 mg | ORAL_TABLET | ORAL | Status: DC | PRN

## 2013-01-10 MED ORDER — ASPIRIN 325 MG PO TBEC: 325.0000 mg | DELAYED_RELEASE_TABLET | Freq: Every day | ORAL | Status: DC

## 2013-01-10 MED ORDER — SODIUM CHLORIDE 0.9 % IV SOLN: INTRAVENOUS | Status: DC

## 2013-01-10 NOTE — Discharge Summary (Signed)
patient examined and medical record reviewed,agree with above note. VAN TRIGT III,Javis Abboud 01/10/2013   

## 2013-01-10 NOTE — Progress Notes (Signed)
[id number] Education completed with pt. Understanding voiced. Pt states he has been walking since chest tube out without difficultly. Pt has attended diet and diabetic classes in CRP 2 so brief ed done with diet. Pt would like referral to CRP 2 at [facility]. Will refer. Has watched postop video. Pt aware that this HGA1C is elevated. He states last one around 6.7 and MD happy with that. Luetta Nutting RN BSN

## 2013-01-10 NOTE — Progress Notes (Addendum)
      [address]       [place] [address]             [phone]      6 Days Post-Op Procedure(s) (LRB): CORONARY ARTERY BYPASS GRAFTING (CABG) times two using right saphenous vein harvested with endoscope. (N/A) INTRAOPERATIVE TRANSESOPHAGEAL ECHOCARDIOGRAM (N/A) REDO STERNOTOMY (N/A) RADIAL ARTERY HARVEST (Left)  Subjective:  [name] is without complaints this morning.  He states he had a BM last evening, but it was mostly liquid.  He does state he would like to go home today.   Objective: Vital signs in last 24 hours: Temp:  [97.8 F (36.6 C)-98.6 F (37 C)] 97.8 F (36.6 C) (05/29 0430) Pulse Rate:  [103-123] 112 (05/29 0430) Cardiac Rhythm:  [-] Sinus tachycardia (05/29 0430) Resp:  [20-28] 20 (05/29 0430) BP: (115-126)/(54-79) 126/73 mmHg (05/29 0430) SpO2:  [95 %-98 %] 95 % (05/29 0430) Weight:  [178 lb 5.6 oz (80.9 kg)] 178 lb 5.6 oz (80.9 kg) (05/29 0250)  Intake/Output from previous day: 05/28 0701 - 05/29 0700 In: 123 [P.O.:120; I.V.:3] Out: 40 [Chest Tube:40]  General appearance: alert, cooperative and no distress Neurologic: intact Heart: regular rate and rhythm and tachy Lungs: clear to auscultation bilaterally Abdomen: soft, non-tender; bowel sounds normal; no masses,  no organomegaly Extremities: edema trace Wound: clean and dry  Lab Results:  Recent Labs  01/09/13 0420  WBC 5.2  HGB 8.7*  HCT 25.4*  PLT 189   BMET:  Recent Labs  01/09/13 0420 01/10/13 0445  NA 140 140  K 4.1 4.0  CL 103 102  CO2 27 26  GLUCOSE 173* 176*  BUN 18 17  CREATININE 1.29 1.38*  CALCIUM 8.8 9.0    PT/INR: No results found for this basename: LABPROT, INR,  in the last 72 hours ABG    Component Value Date/Time   PHART 7.326* 01/05/2013 0519   HCO3 21.7 01/05/2013 0519   TCO2 21 01/05/2013 1642   ACIDBASEDEF 4.0* 01/05/2013 0519   O2SAT 97.0 01/05/2013 0519   CBG (last 3)   Recent Labs  01/09/13 1612 01/09/13 2049  01/10/13 0627  GLUCAP 163* 261* 162*    Assessment/Plan: S/P Procedure(s) (LRB): CORONARY ARTERY BYPASS GRAFTING (CABG) times two using right saphenous vein harvested with endoscope. (N/A) INTRAOPERATIVE TRANSESOPHAGEAL ECHOCARDIOGRAM (N/A) REDO STERNOTOMY (N/A) RADIAL ARTERY HARVEST (Left)  1. CV- NSR, remains tachy, blood pressure improved, will increase Lopressor to 50 mg BID continue Cozaar 2. Pulm- no acute issues, continue IS at discharge 3. Renal- creatinine at baseline, weight is below admission, will d/c diuresis 4. LOC Constipation- resolved 5. DM- CBGs uncontrolled, will adjust insulin regimen 6. Dispo- patient is stable, he would like to be discharged home today if okay with staff    LOS: 13 days    Raford Pitcher, ERIN 01/10/2013  patient examined and medical record reviewed,agree with above note. Ready for DC home today VAN TRIGT III,PETER 01/10/2013

## 2013-01-10 NOTE — Progress Notes (Signed)
Discharged to home with family office visits in place teaching done  

## 2013-01-11 ENCOUNTER — Encounter (HOSPITAL_COMMUNITY): Payer: Self-pay

## 2013-01-15 ENCOUNTER — Encounter (HOSPITAL_COMMUNITY): Payer: 59

## 2013-01-17 ENCOUNTER — Encounter (HOSPITAL_COMMUNITY): Payer: 59

## 2013-01-18 ENCOUNTER — Encounter (HOSPITAL_COMMUNITY): Payer: 59

## 2013-01-22 ENCOUNTER — Encounter (HOSPITAL_COMMUNITY): Payer: 59

## 2013-01-24 ENCOUNTER — Encounter (HOSPITAL_COMMUNITY): Payer: 59

## 2013-01-25 ENCOUNTER — Encounter (HOSPITAL_COMMUNITY): Payer: 59

## 2013-01-29 ENCOUNTER — Encounter (HOSPITAL_COMMUNITY): Payer: 59

## 2013-01-31 ENCOUNTER — Encounter (HOSPITAL_COMMUNITY): Payer: 59

## 2013-01-31 ENCOUNTER — Other Ambulatory Visit: Payer: Self-pay | Admitting: *Deleted

## 2013-01-31 DIAGNOSIS — I2510 Atherosclerotic heart disease of native coronary artery without angina pectoris: Secondary | ICD-10-CM

## 2013-02-01 ENCOUNTER — Encounter (HOSPITAL_COMMUNITY): Payer: 59

## 2013-02-05 ENCOUNTER — Encounter (HOSPITAL_COMMUNITY): Payer: 59

## 2013-02-06 ENCOUNTER — Ambulatory Visit
Admission: RE | Admit: 2013-02-06 | Discharge: 2013-02-06 | Disposition: A | Discharge status: Home / Self Care | Payer: 59 | Source: Ambulatory Visit | Attending: Cardiothoracic Surgery | Admitting: Cardiothoracic Surgery

## 2013-02-06 ENCOUNTER — Ambulatory Visit (INDEPENDENT_AMBULATORY_CARE_PROVIDER_SITE_OTHER): Payer: Self-pay | Admitting: Cardiothoracic Surgery

## 2013-02-06 ENCOUNTER — Encounter: Payer: Self-pay | Admitting: Cardiothoracic Surgery

## 2013-02-06 ENCOUNTER — Other Ambulatory Visit: Payer: Self-pay | Admitting: Cardiothoracic Surgery

## 2013-02-06 VITALS — BP 117/78 | HR 105 | Resp 19 | Ht 71.0 in | Wt 181.0 lb

## 2013-02-06 DIAGNOSIS — I2510 Atherosclerotic heart disease of native coronary artery without angina pectoris: Secondary | ICD-10-CM

## 2013-02-06 LAB — CBC
HCT: 34.5 % — ABNORMAL LOW (ref 39.0–52.0)
Hemoglobin: 10.7 g/dL — ABNORMAL LOW (ref 13.0–17.0)
MCH: 22.1 pg — ABNORMAL LOW (ref 26.0–34.0)
MCHC: 31 g/dL (ref 30.0–36.0)
MCV: 71.3 fL — ABNORMAL LOW (ref 78.0–100.0)
Platelets: 310 10*3/uL (ref 150–400)
RBC: 4.84 MIL/uL (ref 4.22–5.81)
RDW: 19.4 % — ABNORMAL HIGH (ref 11.5–15.5)
WBC: 4.2 10*3/uL (ref 4.0–10.5)

## 2013-02-06 NOTE — Progress Notes (Signed)
PCP is Darrow Bussing, MD Referring Provider is Quintella Reichert, MD  Chief Complaint  Patient presents with  . Routine Post Op    f/u post cabg x 2 on 01/04/13    HPI: Routine followup 4 weeks after redo sternotomy and CABG x2.. Revascularization was difficult because of  previous high-dose radiation to the chest for a seminoma over 25 years ago. Patient had left radial artery exposed and not harvested to 2 poor collateralization prior to division of the vessel. Saphenous vein bypass graft placed to circumflex and distal RCA. Internal mammary arteries were both exposed and not used the to disease-radiation changes. Patient doing well without angina. Incision healing well.   Past Medical History  Diagnosis Date  . Erectile dysfunction   . Hypertension   . Dyslipidemia   . Chronic kidney disease     kidney stones s/p lithotripsy  . Stroke     TIA's  . Cancer     mediastinal seminoma resected 1985 - benign  . Chest pain   . Shortness of breath   . Diabetes mellitus     neuropathy  insulin dependent  . GERD (gastroesophageal reflux disease)   . NSTEMI (non-ST elevated myocardial infarction)   . Coronary artery disease 02/2012    60% mid RCA, 80% prox left circ, 99% distal left circ, normal LAD s/p PCI left circ/OM    Past Surgical History  Procedure Laterality Date  . Resection mediastinal seminonma    . Lithotripsy    . Esophagogastroduodenoscopy  07/20/2012    Procedure: ESOPHAGOGASTRODUODENOSCOPY (EGD);  Surgeon: Vertell Novak., MD;  Location: [facility] ENDOSCOPY;  Service: Endoscopy;  Laterality: N/A;  . Colonoscopy  07/25/2012    Procedure: COLONOSCOPY;  Surgeon: Vertell Novak., MD;  Location: [facility] ENDOSCOPY;  Service: Endoscopy;  Laterality: N/A;  . Coronary artery bypass graft N/A 01/04/2013    Procedure: CORONARY ARTERY BYPASS GRAFTING (CABG) times two using right saphenous vein harvested with endoscope.;  Surgeon: Kerin Perna, MD;  Location: [facility] OR;  Service: Open Heart  Surgery;  Laterality: N/A;  . Intraoperative transesophageal echocardiogram N/A 01/04/2013    Procedure: INTRAOPERATIVE TRANSESOPHAGEAL ECHOCARDIOGRAM;  Surgeon: Kerin Perna, MD;  Location: [facility] OR;  Service: Open Heart Surgery;  Laterality: N/A;  . Radial artery harvest Left 01/04/2013    Procedure: RADIAL ARTERY HARVEST;  Surgeon: Kerin Perna, MD;  Location: [facility] OR;  Service: Vascular;  Laterality: Left;  Artery not havested. Unsuitable for use.    Family History  Problem Relation Age of Onset  . Diabetes Father   . Diabetes Mother   . Heart failure Mother   . Hypertension Mother   . Cancer Brother   . Diabetes Sister     Social History History  Substance Use Topics  . Smoking status: Former Smoker    Quit date: 03/06/1985  . Smokeless tobacco: Never Used  . Alcohol Use: No    Current Outpatient Prescriptions  Medication Sig Dispense Refill  . amitriptyline (ELAVIL) 100 MG tablet Take 100 mg by mouth at bedtime.      Marland Kitchen aspirin EC 325 MG EC tablet Take 1 tablet (325 mg total) by mouth daily.  30 tablet  0  . insulin aspart (NOVOLOG) 100 UNIT/ML injection Inject 6-14 Units into the skin 3 (three) times daily before meals. Patient's dose is done on a sliding scale. Usually takes 8 units, but dose varies depending on glucose level.      . insulin glargine (LANTUS)  100 UNIT/ML injection Inject 40 Units into the skin at bedtime.      . iron polysaccharides (NIFEREX) 150 MG capsule Take 300 mg by mouth daily.       Marland Kitchen losartan (COZAAR) 25 MG tablet Take 25 mg by mouth daily.      . metFORMIN (GLUCOPHAGE) 500 MG tablet Take 1,000 mg by mouth 2 (two) times daily with a meal.      . metoprolol succinate (TOPROL-XL) 50 MG 24 hr tablet Take 100 mg by mouth daily. Take with or immediately following a meal.      . Multiple Vitamin (MULTIVITAMIN) tablet Take 1 tablet by mouth daily.      . Omega-3 Fatty Acids (FISH OIL MAXIMUM STRENGTH) 1200 MG CAPS Take 3,600 mg by mouth 2 (two) times  daily.      Marland Kitchen omeprazole (PRILOSEC) 20 MG capsule Take 20 mg by mouth daily as needed (heartburn).       . ONE TOUCH ULTRA TEST test strip       . oxyCODONE (OXY IR/ROXICODONE) 5 MG immediate release tablet Take 1-2 tablets (5-10 mg total) by mouth every 3 (three) hours as needed.  30 tablet  0  . simvastatin (ZOCOR) 40 MG tablet Take 40 mg by mouth every evening.      . zolpidem (AMBIEN) 10 MG tablet Take 10 mg by mouth at bedtime as needed for sleep.       . clopidogrel (PLAVIX) 75 MG tablet Take 75 mg by mouth daily.       No current facility-administered medications for this visit.    Allergies  Allergen Reactions  . Adhesive (Tape)   . Lipitor (Atorvastatin) Other (See Comments)    myalgias    Review of Systems alert and comfortable Ambulating daily 20 minutes twice a day. Patient ready to start cardiac rehabilitation.   BP 117/78  Pulse 105  Resp 19  Ht 5\' 11"  (1.803 m)  Wt 181 lb (82.101 kg)  BMI 25.26 kg/m2  SpO2 97% Physical Exam No distress Lungs clear Incision is well-healed Cardiac rhythm regular, no or murmur or gallop No peripheral edema  Diagnostic Tests: Chest x-ray with mild elevation of left hemidiaphragm  Impression: Doing well status post CABG x2. Patient may drive. Patient may start outpatient cardiac rehabilitation and lift up to 15 pounds maximum.  Plan:Return in 4 weeks to review progress and to discuss return to work.

## 2013-02-07 ENCOUNTER — Encounter (HOSPITAL_COMMUNITY): Payer: 59

## 2013-02-08 ENCOUNTER — Encounter (HOSPITAL_COMMUNITY): Payer: 59

## 2013-02-12 ENCOUNTER — Encounter (HOSPITAL_COMMUNITY): Payer: 59

## 2013-02-14 ENCOUNTER — Encounter (HOSPITAL_COMMUNITY)
Admission: RE | Admit: 2013-02-14 | Discharge: 2013-02-14 | Disposition: A | Discharge status: Home / Self Care | Payer: 59 | Source: Ambulatory Visit | Attending: Cardiology | Admitting: Cardiology

## 2013-02-14 ENCOUNTER — Encounter (HOSPITAL_COMMUNITY): Payer: 59

## 2013-02-14 DIAGNOSIS — R079 Chest pain, unspecified: Secondary | ICD-10-CM | POA: Insufficient documentation

## 2013-02-14 DIAGNOSIS — Z5189 Encounter for other specified aftercare: Secondary | ICD-10-CM | POA: Insufficient documentation

## 2013-02-14 DIAGNOSIS — I200 Unstable angina: Secondary | ICD-10-CM | POA: Insufficient documentation

## 2013-02-14 NOTE — Progress Notes (Signed)
Cardiac Rehab Medication Review by a Pharmacist  Does the patient  feel that his/her medications are working for him/her?  yes  Has the patient been experiencing any side effects to the medications prescribed?  no  Does the patient measure his/her own blood pressure or blood glucose at home?  yes  * Checks CBGs 4x/day, BP 3-4x/week  Does the patient have any problems obtaining medications due to transportation or finances?   no  Understanding of regimen: good Understanding of indications: good Potential of compliance: excellent  Pharmacist comments: The patient did not have a list so we did the discussion from the patient's memory. He stopped taking his plavix prior to CABG and was instructed not to take it post-operatively. All other medications are up to date and the patient states compliance. He does not utilize a pill box -- I counseled that this may be an easier way to remember which medications he has or hasn't taken for the day.   Georgina Pillion, PharmD, BCPS Clinical Pharmacist Pager: [phone] 02/14/2013 8:24 AM

## 2013-02-18 ENCOUNTER — Encounter (HOSPITAL_COMMUNITY)
Admission: RE | Admit: 2013-02-18 | Discharge: 2013-02-18 | Disposition: A | Discharge status: Home / Self Care | Payer: 59 | Source: Ambulatory Visit | Attending: Cardiology | Admitting: Cardiology

## 2013-02-18 LAB — GLUCOSE, CAPILLARY
Glucose-Capillary: 270 mg/dL — ABNORMAL HIGH (ref 70–99)
Glucose-Capillary: 197 mg/dL — ABNORMAL HIGH (ref 70–99)

## 2013-02-18 NOTE — Progress Notes (Signed)
Pt started cardiac rehab today.  Pt tolerated light exercise without difficulty. Telemetry rhythm Sinus tach 105. Sergei's betablocker was decreased to 75 mg in the [place] office last week. Vital signs stable.Will continue to monitor the patient throughout  the program.

## 2013-02-19 ENCOUNTER — Encounter (HOSPITAL_COMMUNITY): Payer: 59

## 2013-02-20 ENCOUNTER — Encounter (HOSPITAL_COMMUNITY)
Admission: RE | Admit: 2013-02-20 | Discharge: 2013-02-20 | Disposition: A | Discharge status: Home / Self Care | Payer: 59 | Source: Ambulatory Visit | Attending: Cardiology | Admitting: Cardiology

## 2013-02-20 LAB — GLUCOSE, CAPILLARY: Glucose-Capillary: 207 mg/dL — ABNORMAL HIGH (ref 70–99)

## 2013-02-21 ENCOUNTER — Encounter (HOSPITAL_COMMUNITY): Payer: 59

## 2013-02-22 ENCOUNTER — Encounter (HOSPITAL_COMMUNITY): Payer: 59

## 2013-02-25 ENCOUNTER — Encounter (HOSPITAL_COMMUNITY)
Admission: RE | Admit: 2013-02-25 | Discharge: 2013-02-25 | Disposition: A | Discharge status: Home / Self Care | Payer: 59 | Source: Ambulatory Visit | Attending: Cardiology | Admitting: Cardiology

## 2013-02-26 ENCOUNTER — Encounter (HOSPITAL_COMMUNITY): Payer: 59

## 2013-02-27 ENCOUNTER — Encounter (HOSPITAL_COMMUNITY)
Admission: RE | Admit: 2013-02-27 | Discharge: 2013-02-27 | Disposition: A | Discharge status: Home / Self Care | Payer: 59 | Source: Ambulatory Visit | Attending: Cardiology | Admitting: Cardiology

## 2013-02-27 LAB — GLUCOSE, CAPILLARY
Glucose-Capillary: 173 mg/dL — ABNORMAL HIGH (ref 70–99)
Glucose-Capillary: 137 mg/dL — ABNORMAL HIGH (ref 70–99)

## 2013-02-27 NOTE — Progress Notes (Signed)
[name] 62 y.o. male Nutrition Note Spoke with pt. Pt well-known to this Clinical research associate from previous admission. Nutrition Plan and Nutrition Survey goals reviewed with pt. Pt is following Step 2 of the Therapeutic Lifestyle Changes diet according to pt's MEDFICTS survey. Per discussion with pt, pt's wife continues to be controlling with pt diet and pt indulges in "food she doesn't want me to eat when she's not around." Pt diet and attitude toward DM diet appear to have improved since previous admission. Pt is diabetic. Last A1c indicates blood glucose not well-controlled. Pt states he sees Dr. Talmage Nap next week to have his A1c checked. Pt asked about his DM medication and pt reports "I sometimes forget to give myself my Novolog until after I've eaten." Pt feels he forgets his Novolog before eating "about 30% of the time." Pt's A1c has increased from 8.4 03/06/12 to 9.1 12/28/12. Pt reports he checks his CBG's 4-5 times daily. Pt got 14 out of 15 questions on his Diabetes education test correct. Barriers to tight blood glucose control include pt application of DM knowledge to himself and his pt's difficulty with coping with his wife's control over food. Pt states he has improved in his food choices especially at work because he brings his lunch. 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 Pt aware of nutrition education classes offered and is unable to attend nutrition classes.  Nutrition Diagnosis   Food-and nutrition-related knowledge deficit related to lack of exposure to information as related to diagnosis of: ? CVD ? DM (A1c 9.1)  Nutrition RX/ Estimated Daily Nutrition Needs for: wt maintenance 2300-2600 Kcal, 75-85 gm fat, 15-18 gm sat fat, 2.3-2.6 gm trans-fat, <1500 mg sodium, 325 gm CHO   Nutrition Intervention   Pt's individual nutrition plan reviewed with pt.   Benefits of adopting Therapeutic Lifestyle  Changes discussed when Medficts reviewed.   Pt to attend the Portion Distortion class   Pt given handouts for: ? Nutrition I class ? Nutrition II class ? Diabetes Blitz class   Continue client-centered nutrition education by RD, as part of interdisciplinary care. Goal(s)   Pt to describe the benefit of including fruits, vegetables, whole grains, and low-fat dairy products in a heart healthy meal plan.   CBG concentrations in the normal range or as close to normal as is safely possible. Monitor and Evaluate progress toward nutrition goal with team. Nutrition Risk: Change to Moderate   [name], M.Ed, RD, LDN, CDE 02/27/2013 11:21 AM

## 2013-02-28 ENCOUNTER — Encounter (HOSPITAL_COMMUNITY): Payer: 59

## 2013-03-01 ENCOUNTER — Encounter (HOSPITAL_COMMUNITY): Payer: 59

## 2013-03-01 ENCOUNTER — Encounter (HOSPITAL_COMMUNITY)
Admission: RE | Admit: 2013-03-01 | Discharge: 2013-03-01 | Disposition: A | Discharge status: Home / Self Care | Payer: 59 | Source: Ambulatory Visit | Attending: Cardiology | Admitting: Cardiology

## 2013-03-04 ENCOUNTER — Encounter (HOSPITAL_COMMUNITY)
Admission: RE | Admit: 2013-03-04 | Discharge: 2013-03-04 | Disposition: A | Discharge status: Home / Self Care | Payer: 59 | Source: Ambulatory Visit | Attending: Cardiology | Admitting: Cardiology

## 2013-03-04 LAB — GLUCOSE, CAPILLARY: Glucose-Capillary: 93 mg/dL (ref 70–99)

## 2013-03-04 NOTE — Progress Notes (Signed)
Reviewed home exercise with pt today.  Pt plans to walk outside when temperature allows and continue yard work for exercise.  Reviewed THR, pulse, RPE, sign and symptoms, NTG use, and when to call 911 or MD.  Pt voiced understanding.  Laray Anger Academic Intern  Fabio Pierce, MA, ACSM RCEP

## 2013-03-04 NOTE — Progress Notes (Signed)
Pre exercise CBG 134. [name] has a continuous blood sugar meter. [name] complained of feeling bad and that his blood sugar had dropped. CBG 46.  Patient given 2 tubes of instant glucose gel.  Patient also given lemonade. Repeat CBG 93. [name] was then given peanut butter and graham crackers. [name]'s symptoms went away after given glucose gel and a snack.  Dr Willeen Cass office called and notified. [name] was counseled by the dietitian.  [name]'s exit CBG was 102 by his home meter upon exit from cardiac rehab. Will continue to monitor the patient throughout  the program. Will fax exercise flow sheets to Dr. Willeen Cass office for review.

## 2013-03-04 NOTE — Progress Notes (Addendum)
Nutrition Note Spoke with pt. Pt fasting CBG reportedly 84 mg/dL. Pre-exercise CBG 210 mg/dL. Pt states he took 8 units of insulin and ate multigrain cheerios and milk. CBG during exercise session dropped to 46 mg/dL. Pt treated with 2 glucose gels. Pt CBG increased to 91 mg/dL a few minutes later according to pt's CGM. Pt breakfast choices discussed at length. Pt again encouraged to choose a high fiber cereal or starch (preferably 5 grams of fiber or more per serving) and protein at breakfast. Pt states he will return to his usual oat bran cereal from [employer]. Pt to add protein to his pre-exercise breakfast meal. Pt feels part of the reason his CBG dropped this morning was because "I don't think I ate enough." Continue client-centered nutrition education by RD as part of interdisciplinary care.  Monitor and evaluate progress toward nutrition goal with team.  Mickle Plumb, M.Ed, RD, LDN, CDE 03/04/2013 10:47 AM

## 2013-03-05 ENCOUNTER — Encounter (HOSPITAL_COMMUNITY): Payer: 59

## 2013-03-06 ENCOUNTER — Encounter (HOSPITAL_COMMUNITY)
Admission: RE | Admit: 2013-03-06 | Discharge: 2013-03-06 | Disposition: A | Discharge status: Home / Self Care | Payer: 59 | Source: Ambulatory Visit | Attending: Cardiology | Admitting: Cardiology

## 2013-03-06 LAB — GLUCOSE, CAPILLARY
Glucose-Capillary: 105 mg/dL — ABNORMAL HIGH (ref 70–99)
Glucose-Capillary: 184 mg/dL — ABNORMAL HIGH (ref 70–99)
Glucose-Capillary: 94 mg/dL (ref 70–99)

## 2013-03-06 MED FILL — Glucose Gel 40%: ORAL | Qty: 1.65 | Status: AC

## 2013-03-07 ENCOUNTER — Encounter (HOSPITAL_COMMUNITY): Payer: 59

## 2013-03-08 ENCOUNTER — Encounter (HOSPITAL_COMMUNITY): Payer: 59

## 2013-03-08 ENCOUNTER — Encounter (HOSPITAL_COMMUNITY)
Admission: RE | Admit: 2013-03-08 | Discharge: 2013-03-08 | Disposition: A | Discharge status: Home / Self Care | Payer: 59 | Source: Ambulatory Visit | Attending: Cardiology | Admitting: Cardiology

## 2013-03-08 LAB — GLUCOSE, CAPILLARY: Glucose-Capillary: 265 mg/dL — ABNORMAL HIGH (ref 70–99)

## 2013-03-11 ENCOUNTER — Encounter (HOSPITAL_COMMUNITY)
Admission: RE | Admit: 2013-03-11 | Discharge: 2013-03-11 | Disposition: A | Discharge status: Home / Self Care | Payer: 59 | Source: Ambulatory Visit | Attending: Cardiology | Admitting: Cardiology

## 2013-03-12 ENCOUNTER — Encounter (HOSPITAL_COMMUNITY): Payer: 59

## 2013-03-13 ENCOUNTER — Encounter (HOSPITAL_COMMUNITY)
Admission: RE | Admit: 2013-03-13 | Discharge: 2013-03-13 | Disposition: A | Discharge status: Home / Self Care | Payer: 59 | Source: Ambulatory Visit | Attending: Cardiology | Admitting: Cardiology

## 2013-03-14 ENCOUNTER — Encounter (HOSPITAL_COMMUNITY): Payer: 59

## 2013-03-15 ENCOUNTER — Encounter (HOSPITAL_COMMUNITY): Payer: 59

## 2013-03-15 ENCOUNTER — Encounter (HOSPITAL_COMMUNITY)
Admission: RE | Admit: 2013-03-15 | Discharge: 2013-03-15 | Disposition: A | Discharge status: Home / Self Care | Payer: 59 | Source: Ambulatory Visit | Attending: Cardiology | Admitting: Cardiology

## 2013-03-15 DIAGNOSIS — I200 Unstable angina: Secondary | ICD-10-CM | POA: Insufficient documentation

## 2013-03-15 DIAGNOSIS — Z5189 Encounter for other specified aftercare: Secondary | ICD-10-CM | POA: Insufficient documentation

## 2013-03-15 DIAGNOSIS — R079 Chest pain, unspecified: Secondary | ICD-10-CM | POA: Insufficient documentation

## 2013-03-18 ENCOUNTER — Encounter (HOSPITAL_COMMUNITY)
Admission: RE | Admit: 2013-03-18 | Discharge: 2013-03-18 | Disposition: A | Discharge status: Home / Self Care | Payer: 59 | Source: Ambulatory Visit | Attending: Cardiology | Admitting: Cardiology

## 2013-03-19 ENCOUNTER — Encounter (HOSPITAL_COMMUNITY): Payer: 59

## 2013-03-19 ENCOUNTER — Other Ambulatory Visit: Payer: Self-pay | Admitting: *Deleted

## 2013-03-19 DIAGNOSIS — I2510 Atherosclerotic heart disease of native coronary artery without angina pectoris: Secondary | ICD-10-CM

## 2013-03-20 ENCOUNTER — Ambulatory Visit
Admission: RE | Admit: 2013-03-20 | Discharge: 2013-03-20 | Disposition: A | Discharge status: Home / Self Care | Payer: 59 | Source: Ambulatory Visit | Attending: Cardiothoracic Surgery | Admitting: Cardiothoracic Surgery

## 2013-03-20 ENCOUNTER — Encounter: Payer: Self-pay | Admitting: Cardiothoracic Surgery

## 2013-03-20 ENCOUNTER — Other Ambulatory Visit: Payer: Self-pay | Admitting: *Deleted

## 2013-03-20 ENCOUNTER — Encounter (HOSPITAL_COMMUNITY): Payer: 59

## 2013-03-20 ENCOUNTER — Other Ambulatory Visit: Payer: Self-pay | Admitting: Cardiothoracic Surgery

## 2013-03-20 ENCOUNTER — Ambulatory Visit (INDEPENDENT_AMBULATORY_CARE_PROVIDER_SITE_OTHER): Payer: Self-pay | Admitting: Cardiothoracic Surgery

## 2013-03-20 VITALS — BP 126/79 | HR 104 | Resp 20 | Ht 71.0 in | Wt 180.0 lb

## 2013-03-20 DIAGNOSIS — I2510 Atherosclerotic heart disease of native coronary artery without angina pectoris: Secondary | ICD-10-CM

## 2013-03-20 DIAGNOSIS — D649 Anemia, unspecified: Secondary | ICD-10-CM

## 2013-03-20 DIAGNOSIS — Z951 Presence of aortocoronary bypass graft: Secondary | ICD-10-CM

## 2013-03-20 LAB — CBC
HCT: 36.3 % — ABNORMAL LOW (ref 39.0–52.0)
Hemoglobin: 11.1 g/dL — ABNORMAL LOW (ref 13.0–17.0)
MCH: 20.2 pg — ABNORMAL LOW (ref 26.0–34.0)
MCHC: 30.6 g/dL (ref 30.0–36.0)
MCV: 66 fL — ABNORMAL LOW (ref 78.0–100.0)
Platelets: 408 10*3/uL — ABNORMAL HIGH (ref 150–400)
RBC: 5.5 MIL/uL (ref 4.22–5.81)
RDW: 20.1 % — ABNORMAL HIGH (ref 11.5–15.5)
WBC: 6.1 10*3/uL (ref 4.0–10.5)

## 2013-03-20 NOTE — Progress Notes (Signed)
PCP is Bill Bussing, MD Referring Provider is Quintella Reichert, MD  Chief Complaint  Patient presents with  . Routine Post Op    1 week f/u with CXR and CBC, S/P CABG     HPI: Doing well almost 3 months after CABG Doing well and outpatient cardiac rehabilitation No angina, exercise intolerance improving but still some mild dyspnea with walking up steep hills. Chest x-ray today is clear. CBC is pending. Incisions are well-healed.  Past Medical History  Diagnosis Date  . Erectile dysfunction   . Hypertension   . Dyslipidemia   . Chronic kidney disease     kidney stones s/p lithotripsy  . Stroke     TIA's  . Cancer     mediastinal seminoma resected 1985 - benign  . Chest pain   . Shortness of breath   . Diabetes mellitus     neuropathy  insulin dependent  . GERD (gastroesophageal reflux disease)   . NSTEMI (non-ST elevated myocardial infarction)   . Coronary artery disease 02/2012    60% mid RCA, 80% prox left circ, 99% distal left circ, normal LAD s/p PCI left circ/OM    Past Surgical History  Procedure Laterality Date  . Resection mediastinal seminonma    . Lithotripsy    . Esophagogastroduodenoscopy  07/20/2012    Procedure: ESOPHAGOGASTRODUODENOSCOPY (EGD);  Surgeon: Vertell Novak., MD;  Location: [facility] ENDOSCOPY;  Service: Endoscopy;  Laterality: N/A;  . Colonoscopy  07/25/2012    Procedure: COLONOSCOPY;  Surgeon: Vertell Novak., MD;  Location: [facility] ENDOSCOPY;  Service: Endoscopy;  Laterality: N/A;  . Coronary artery bypass graft N/A 01/04/2013    Procedure: CORONARY ARTERY BYPASS GRAFTING (CABG) times two using right saphenous vein harvested with endoscope.;  Surgeon: Kerin Perna, MD;  Location: [facility] OR;  Service: Open Heart Surgery;  Laterality: N/A;  . Intraoperative transesophageal echocardiogram N/A 01/04/2013    Procedure: INTRAOPERATIVE TRANSESOPHAGEAL ECHOCARDIOGRAM;  Surgeon: Kerin Perna, MD;  Location: [facility] OR;  Service: Open Heart Surgery;  Laterality:  N/A;  . Radial artery harvest Left 01/04/2013    Procedure: RADIAL ARTERY HARVEST;  Surgeon: Kerin Perna, MD;  Location: [facility] OR;  Service: Vascular;  Laterality: Left;  Artery not havested. Unsuitable for use.    Family History  Problem Relation Age of Onset  . Diabetes Father   . Diabetes Mother   . Heart failure Mother   . Hypertension Mother   . Cancer Brother   . Diabetes Sister     Social History History  Substance Use Topics  . Smoking status: Former Smoker    Quit date: 03/06/1985  . Smokeless tobacco: Never Used  . Alcohol Use: No    Current Outpatient Prescriptions  Medication Sig Dispense Refill  . amitriptyline (ELAVIL) 100 MG tablet Take 100 mg by mouth at bedtime.      Marland Kitchen aspirin EC 325 MG EC tablet Take 1 tablet (325 mg total) by mouth daily.  30 tablet  0  . insulin aspart (NOVOLOG) 100 UNIT/ML injection Inject 6-14 Units into the skin 3 (three) times daily before meals. Patient's dose is done on a sliding scale. Usually takes 8 units, but dose varies depending on glucose level.      . insulin glargine (LANTUS) 100 UNIT/ML injection Inject 40 Units into the skin at bedtime.      . iron polysaccharides (NIFEREX) 150 MG capsule Take 300 mg by mouth daily.       . metFORMIN (GLUCOPHAGE)  500 MG tablet Take 1,000 mg by mouth 2 (two) times daily with a meal.      . metoprolol succinate (TOPROL-XL) 50 MG 24 hr tablet Take 75 mg by mouth daily. Take with or immediately following a meal.      . Multiple Vitamin (MULTIVITAMIN) tablet Take 1 tablet by mouth daily.      . Omega-3 Fatty Acids (FISH OIL MAXIMUM STRENGTH) 1200 MG CAPS Take 3,600 mg by mouth 2 (two) times daily.      Marland Kitchen omeprazole (PRILOSEC) 20 MG capsule Take 20 mg by mouth daily as needed (heartburn/PRN only).       . ONE TOUCH ULTRA TEST test strip       . simvastatin (ZOCOR) 40 MG tablet Take 40 mg by mouth every evening.      . zolpidem (AMBIEN) 10 MG tablet Take 10 mg by mouth at bedtime as needed for  sleep.       Marland Kitchen losartan (COZAAR) 25 MG tablet Take 25 mg by mouth daily.       No current facility-administered medications for this visit.    Allergies  Allergen Reactions  . Lipitor (Atorvastatin) Other (See Comments)    myalgias  . Adhesive (Tape) Rash    Review of Systems no fever weight stable  BP 126/79  Pulse 104  Resp 20  Ht 5\' 11"  (1.803 m)  Wt 180 lb (81.647 kg)  BMI 25.12 kg/m2  SpO2 97% Physical Exam Alert and comfortable Heart rhythm regular Lungs clear Incision is healed No pedal edema  Diagnostic Tests:  Chest x-ray clear Impression: Doing well almost 3 months postop Plan: Ready to return to work, return to work form signed for August 11. Return as needed

## 2013-03-21 ENCOUNTER — Encounter (HOSPITAL_COMMUNITY): Payer: 59

## 2013-03-22 ENCOUNTER — Encounter (HOSPITAL_COMMUNITY): Payer: 59

## 2013-03-22 ENCOUNTER — Encounter (HOSPITAL_COMMUNITY)
Admission: RE | Admit: 2013-03-22 | Discharge: 2013-03-22 | Disposition: A | Discharge status: Home / Self Care | Payer: 59 | Source: Ambulatory Visit | Attending: Cardiology | Admitting: Cardiology

## 2013-03-22 NOTE — Progress Notes (Signed)
[name] plans to return to work on Monday. [name] is switching to the 0815 exercise class.

## 2013-03-25 ENCOUNTER — Encounter (HOSPITAL_COMMUNITY)
Admission: RE | Admit: 2013-03-25 | Discharge: 2013-03-25 | Disposition: A | Discharge status: Home / Self Care | Payer: 59 | Source: Ambulatory Visit | Attending: Cardiology | Admitting: Cardiology

## 2013-03-25 ENCOUNTER — Encounter (HOSPITAL_COMMUNITY): Payer: 59

## 2013-03-25 LAB — GLUCOSE, CAPILLARY: Glucose-Capillary: 109 mg/dL — ABNORMAL HIGH (ref 70–99)

## 2013-03-25 NOTE — Progress Notes (Signed)
[name] blood sugar this morning was 94. Patient was given Ginger ale and graham crackers and peanut butter.  Repeat blood sugar 77. Cruise says his blood sugar was 270 this morning he took his insulin and did not eat breakfast.  Jonny Ruiz went to eat breakfast repeat blood sugar 109. Brownie exercised with the 1115 exercise class today.

## 2013-03-26 ENCOUNTER — Encounter (HOSPITAL_COMMUNITY): Payer: 59

## 2013-03-27 ENCOUNTER — Encounter (HOSPITAL_COMMUNITY): Payer: 59

## 2013-03-27 ENCOUNTER — Encounter (HOSPITAL_COMMUNITY)
Admission: RE | Admit: 2013-03-27 | Discharge: 2013-03-27 | Disposition: A | Discharge status: Home / Self Care | Payer: 59 | Source: Ambulatory Visit | Attending: Cardiology | Admitting: Cardiology

## 2013-03-27 LAB — GLUCOSE, CAPILLARY: Glucose-Capillary: 177 mg/dL — ABNORMAL HIGH (ref 70–99)

## 2013-03-28 ENCOUNTER — Encounter (HOSPITAL_COMMUNITY): Payer: 59

## 2013-03-29 ENCOUNTER — Encounter (HOSPITAL_COMMUNITY): Payer: 59

## 2013-03-29 ENCOUNTER — Encounter (HOSPITAL_COMMUNITY)
Admission: RE | Admit: 2013-03-29 | Discharge: 2013-03-29 | Disposition: A | Discharge status: Home / Self Care | Payer: 59 | Source: Ambulatory Visit | Attending: Cardiology | Admitting: Cardiology

## 2013-03-29 LAB — GLUCOSE, CAPILLARY: Glucose-Capillary: 180 mg/dL — ABNORMAL HIGH (ref 70–99)

## 2013-04-01 ENCOUNTER — Encounter (HOSPITAL_COMMUNITY)
Admission: RE | Admit: 2013-04-01 | Discharge: 2013-04-01 | Disposition: A | Discharge status: Home / Self Care | Payer: 59 | Source: Ambulatory Visit | Attending: Cardiology | Admitting: Cardiology

## 2013-04-01 ENCOUNTER — Encounter (HOSPITAL_COMMUNITY): Payer: 59

## 2013-04-01 LAB — GLUCOSE, CAPILLARY: Glucose-Capillary: 223 mg/dL — ABNORMAL HIGH (ref 70–99)

## 2013-04-02 ENCOUNTER — Encounter (HOSPITAL_COMMUNITY): Payer: 59

## 2013-04-03 ENCOUNTER — Encounter (HOSPITAL_COMMUNITY): Payer: 59

## 2013-04-04 ENCOUNTER — Encounter (HOSPITAL_COMMUNITY): Payer: 59

## 2013-04-05 ENCOUNTER — Encounter (HOSPITAL_COMMUNITY): Payer: 59

## 2013-04-05 ENCOUNTER — Encounter (HOSPITAL_COMMUNITY)
Admission: RE | Admit: 2013-04-05 | Discharge: 2013-04-05 | Disposition: A | Discharge status: Home / Self Care | Payer: 59 | Source: Ambulatory Visit | Attending: Cardiology | Admitting: Cardiology

## 2013-04-08 ENCOUNTER — Encounter (HOSPITAL_COMMUNITY)
Admission: RE | Admit: 2013-04-08 | Discharge: 2013-04-08 | Disposition: A | Discharge status: Home / Self Care | Payer: 59 | Source: Ambulatory Visit | Attending: Cardiology | Admitting: Cardiology

## 2013-04-08 ENCOUNTER — Encounter (HOSPITAL_COMMUNITY): Payer: 59

## 2013-04-08 LAB — GLUCOSE, CAPILLARY: Glucose-Capillary: 154 mg/dL — ABNORMAL HIGH (ref 70–99)

## 2013-04-09 ENCOUNTER — Encounter (HOSPITAL_COMMUNITY): Payer: 59

## 2013-04-10 ENCOUNTER — Encounter (HOSPITAL_COMMUNITY): Payer: 59

## 2013-04-10 ENCOUNTER — Encounter (HOSPITAL_COMMUNITY)
Admission: RE | Admit: 2013-04-10 | Discharge: 2013-04-10 | Disposition: A | Discharge status: Home / Self Care | Payer: 59 | Source: Ambulatory Visit | Attending: Cardiology | Admitting: Cardiology

## 2013-04-11 ENCOUNTER — Encounter (HOSPITAL_COMMUNITY): Payer: 59

## 2013-04-12 ENCOUNTER — Encounter (HOSPITAL_COMMUNITY): Payer: 59

## 2013-04-12 ENCOUNTER — Encounter (HOSPITAL_COMMUNITY)
Admission: RE | Admit: 2013-04-12 | Discharge: 2013-04-12 | Disposition: A | Discharge status: Home / Self Care | Payer: 59 | Source: Ambulatory Visit | Attending: Cardiology | Admitting: Cardiology

## 2013-04-16 ENCOUNTER — Encounter (HOSPITAL_COMMUNITY): Payer: 59

## 2013-04-17 ENCOUNTER — Encounter (HOSPITAL_COMMUNITY): Payer: 59

## 2013-04-17 ENCOUNTER — Encounter (HOSPITAL_COMMUNITY)
Admission: RE | Admit: 2013-04-17 | Discharge: 2013-04-17 | Disposition: A | Discharge status: Home / Self Care | Payer: 59 | Source: Ambulatory Visit | Attending: Cardiology | Admitting: Cardiology

## 2013-04-17 DIAGNOSIS — Z5189 Encounter for other specified aftercare: Secondary | ICD-10-CM | POA: Insufficient documentation

## 2013-04-17 DIAGNOSIS — I200 Unstable angina: Secondary | ICD-10-CM | POA: Insufficient documentation

## 2013-04-17 DIAGNOSIS — R079 Chest pain, unspecified: Secondary | ICD-10-CM | POA: Insufficient documentation

## 2013-04-18 ENCOUNTER — Encounter (HOSPITAL_COMMUNITY): Payer: 59

## 2013-04-19 ENCOUNTER — Encounter (HOSPITAL_COMMUNITY): Payer: 59

## 2013-04-19 ENCOUNTER — Encounter (HOSPITAL_COMMUNITY)
Admission: RE | Admit: 2013-04-19 | Discharge: 2013-04-19 | Disposition: A | Discharge status: Home / Self Care | Payer: 59 | Source: Ambulatory Visit | Attending: Cardiology | Admitting: Cardiology

## 2013-04-22 ENCOUNTER — Encounter (HOSPITAL_COMMUNITY): Payer: 59

## 2013-04-22 ENCOUNTER — Encounter (HOSPITAL_COMMUNITY)
Admission: RE | Admit: 2013-04-22 | Discharge: 2013-04-22 | Disposition: A | Discharge status: Home / Self Care | Payer: 59 | Source: Ambulatory Visit | Attending: Cardiology | Admitting: Cardiology

## 2013-04-22 NOTE — Progress Notes (Signed)
CBG 309 this AM urine negative for ketones. No exercise per protocol.  [name] said he didn't take his insulin and other medications this morning. [name] said he was going home to take his medication.  Dr Willeen Cass office called and notified of elevated CBG.  Will fax exercise flow sheets to Dr. Willeen Cass office for review.

## 2013-04-23 ENCOUNTER — Encounter (HOSPITAL_COMMUNITY): Payer: 59

## 2013-04-24 ENCOUNTER — Encounter (HOSPITAL_COMMUNITY)
Admission: RE | Admit: 2013-04-24 | Discharge: 2013-04-24 | Disposition: A | Discharge status: Home / Self Care | Payer: 59 | Source: Ambulatory Visit | Attending: Cardiology | Admitting: Cardiology

## 2013-04-24 ENCOUNTER — Encounter (HOSPITAL_COMMUNITY): Payer: 59

## 2013-04-25 ENCOUNTER — Encounter (HOSPITAL_COMMUNITY): Payer: 59

## 2013-04-26 ENCOUNTER — Encounter (HOSPITAL_COMMUNITY): Payer: 59

## 2013-04-26 ENCOUNTER — Encounter (HOSPITAL_COMMUNITY)
Admission: RE | Admit: 2013-04-26 | Discharge: 2013-04-26 | Disposition: A | Discharge status: Home / Self Care | Payer: 59 | Source: Ambulatory Visit | Attending: Cardiology | Admitting: Cardiology

## 2013-04-29 ENCOUNTER — Encounter (HOSPITAL_COMMUNITY)
Admission: RE | Admit: 2013-04-29 | Discharge: 2013-04-29 | Disposition: A | Discharge status: Home / Self Care | Payer: 59 | Source: Ambulatory Visit | Attending: Cardiology | Admitting: Cardiology

## 2013-04-29 ENCOUNTER — Encounter (HOSPITAL_COMMUNITY): Payer: 59

## 2013-04-30 ENCOUNTER — Encounter (HOSPITAL_COMMUNITY): Payer: 59

## 2013-05-01 ENCOUNTER — Encounter (HOSPITAL_COMMUNITY): Payer: 59

## 2013-05-01 ENCOUNTER — Encounter (HOSPITAL_COMMUNITY)
Admission: RE | Admit: 2013-05-01 | Discharge: 2013-05-01 | Disposition: A | Discharge status: Home / Self Care | Payer: 59 | Source: Ambulatory Visit | Attending: Cardiology | Admitting: Cardiology

## 2013-05-02 ENCOUNTER — Encounter (HOSPITAL_COMMUNITY): Payer: 59

## 2013-05-03 ENCOUNTER — Encounter (HOSPITAL_COMMUNITY): Payer: 59

## 2013-05-03 ENCOUNTER — Encounter (HOSPITAL_COMMUNITY)
Admission: RE | Admit: 2013-05-03 | Discharge: 2013-05-03 | Disposition: A | Discharge status: Home / Self Care | Payer: 59 | Source: Ambulatory Visit | Attending: Cardiology | Admitting: Cardiology

## 2013-05-04 ENCOUNTER — Encounter: Payer: Self-pay | Admitting: Cardiology

## 2013-05-04 ENCOUNTER — Other Ambulatory Visit: Payer: Self-pay | Admitting: Cardiology

## 2013-05-04 DIAGNOSIS — I2510 Atherosclerotic heart disease of native coronary artery without angina pectoris: Secondary | ICD-10-CM

## 2013-05-04 DIAGNOSIS — E7800 Pure hypercholesterolemia, unspecified: Secondary | ICD-10-CM

## 2013-05-04 DIAGNOSIS — G459 Transient cerebral ischemic attack, unspecified: Secondary | ICD-10-CM

## 2013-05-04 DIAGNOSIS — R0602 Shortness of breath: Secondary | ICD-10-CM | POA: Insufficient documentation

## 2013-05-04 DIAGNOSIS — Z79899 Other long term (current) drug therapy: Secondary | ICD-10-CM

## 2013-05-04 DIAGNOSIS — I10 Essential (primary) hypertension: Secondary | ICD-10-CM

## 2013-05-04 DIAGNOSIS — R079 Chest pain, unspecified: Secondary | ICD-10-CM | POA: Insufficient documentation

## 2013-05-06 ENCOUNTER — Encounter (HOSPITAL_COMMUNITY): Payer: 59

## 2013-05-07 ENCOUNTER — Encounter (HOSPITAL_COMMUNITY): Payer: 59

## 2013-05-08 ENCOUNTER — Encounter (HOSPITAL_COMMUNITY)
Admission: RE | Admit: 2013-05-08 | Discharge: 2013-05-08 | Disposition: A | Discharge status: Home / Self Care | Payer: 59 | Source: Ambulatory Visit | Attending: Cardiology | Admitting: Cardiology

## 2013-05-08 ENCOUNTER — Encounter (HOSPITAL_COMMUNITY): Payer: 59

## 2013-05-09 ENCOUNTER — Encounter (HOSPITAL_COMMUNITY): Payer: 59

## 2013-05-10 ENCOUNTER — Encounter (HOSPITAL_COMMUNITY): Payer: 59

## 2013-05-10 ENCOUNTER — Encounter (HOSPITAL_COMMUNITY)
Admission: RE | Admit: 2013-05-10 | Discharge: 2013-05-10 | Disposition: A | Discharge status: Home / Self Care | Payer: 59 | Source: Ambulatory Visit | Attending: Cardiology | Admitting: Cardiology

## 2013-05-13 ENCOUNTER — Encounter (HOSPITAL_COMMUNITY)
Admission: RE | Admit: 2013-05-13 | Discharge: 2013-05-13 | Disposition: A | Discharge status: Home / Self Care | Payer: 59 | Source: Ambulatory Visit | Attending: Cardiology | Admitting: Cardiology

## 2013-05-13 ENCOUNTER — Encounter (HOSPITAL_COMMUNITY): Payer: 59

## 2013-05-14 ENCOUNTER — Encounter (HOSPITAL_COMMUNITY): Payer: 59

## 2013-05-15 ENCOUNTER — Encounter (HOSPITAL_COMMUNITY)
Admission: RE | Admit: 2013-05-15 | Discharge: 2013-05-15 | Disposition: A | Discharge status: Home / Self Care | Payer: 59 | Source: Ambulatory Visit | Attending: Cardiology | Admitting: Cardiology

## 2013-05-15 ENCOUNTER — Ambulatory Visit: Payer: 59 | Admitting: Cardiology

## 2013-05-15 ENCOUNTER — Encounter (HOSPITAL_COMMUNITY): Payer: 59

## 2013-05-15 DIAGNOSIS — R079 Chest pain, unspecified: Secondary | ICD-10-CM | POA: Insufficient documentation

## 2013-05-15 DIAGNOSIS — Z5189 Encounter for other specified aftercare: Secondary | ICD-10-CM | POA: Insufficient documentation

## 2013-05-15 DIAGNOSIS — I200 Unstable angina: Secondary | ICD-10-CM | POA: Insufficient documentation

## 2013-05-16 ENCOUNTER — Encounter (HOSPITAL_COMMUNITY): Payer: 59

## 2013-05-17 ENCOUNTER — Encounter (HOSPITAL_COMMUNITY): Payer: 59

## 2013-05-17 ENCOUNTER — Encounter (HOSPITAL_COMMUNITY)
Admission: RE | Admit: 2013-05-17 | Discharge: 2013-05-17 | Disposition: A | Discharge status: Home / Self Care | Payer: 59 | Source: Ambulatory Visit | Attending: Cardiology | Admitting: Cardiology

## 2013-05-20 ENCOUNTER — Encounter (HOSPITAL_COMMUNITY)
Admission: RE | Admit: 2013-05-20 | Discharge: 2013-05-20 | Disposition: A | Discharge status: Home / Self Care | Payer: 59 | Source: Ambulatory Visit | Attending: Cardiology | Admitting: Cardiology

## 2013-05-20 ENCOUNTER — Encounter (HOSPITAL_COMMUNITY): Payer: 59

## 2013-05-21 ENCOUNTER — Encounter (HOSPITAL_COMMUNITY): Payer: 59

## 2013-05-22 ENCOUNTER — Encounter (HOSPITAL_COMMUNITY)
Admission: RE | Admit: 2013-05-22 | Discharge: 2013-05-22 | Disposition: A | Discharge status: Home / Self Care | Payer: 59 | Source: Ambulatory Visit | Attending: Cardiology | Admitting: Cardiology

## 2013-05-22 ENCOUNTER — Encounter (HOSPITAL_COMMUNITY): Payer: 59

## 2013-05-23 ENCOUNTER — Encounter (HOSPITAL_COMMUNITY): Payer: 59

## 2013-05-24 ENCOUNTER — Encounter (HOSPITAL_COMMUNITY)
Admission: RE | Admit: 2013-05-24 | Discharge: 2013-05-24 | Disposition: A | Discharge status: Home / Self Care | Payer: 59 | Source: Ambulatory Visit | Attending: Cardiology | Admitting: Cardiology

## 2013-05-24 ENCOUNTER — Encounter (HOSPITAL_COMMUNITY): Payer: 59

## 2013-05-24 ENCOUNTER — Encounter (HOSPITAL_COMMUNITY): Payer: Self-pay

## 2013-05-24 NOTE — Progress Notes (Signed)
Pt graduated from cardiac rehab program today.  Medication list reconciled.  PHQ9 score-  0.  Pt has made significant lifestyle changes and should be commended for his success. Pt has had well controlled blood sugar with better dietary habits and medication regimen.  Pt plans to continue exercise in cardiac maintenance program.

## 2013-05-28 ENCOUNTER — Encounter (HOSPITAL_COMMUNITY): Payer: 59

## 2013-05-30 ENCOUNTER — Encounter (HOSPITAL_COMMUNITY): Payer: 59

## 2013-05-31 ENCOUNTER — Encounter (HOSPITAL_COMMUNITY): Payer: 59

## 2013-06-04 ENCOUNTER — Encounter (HOSPITAL_COMMUNITY): Payer: 59

## 2013-06-06 ENCOUNTER — Encounter (HOSPITAL_COMMUNITY): Payer: 59

## 2013-06-07 ENCOUNTER — Encounter (HOSPITAL_COMMUNITY): Payer: 59

## 2013-06-11 ENCOUNTER — Encounter (HOSPITAL_COMMUNITY): Payer: 59

## 2013-06-12 ENCOUNTER — Encounter: Payer: Self-pay | Admitting: Cardiology

## 2013-06-13 ENCOUNTER — Encounter (HOSPITAL_COMMUNITY): Payer: 59

## 2013-06-14 ENCOUNTER — Encounter (HOSPITAL_COMMUNITY): Payer: 59

## 2013-06-18 ENCOUNTER — Encounter (HOSPITAL_COMMUNITY)
Admission: RE | Admit: 2013-06-18 | Discharge: 2013-06-18 | Disposition: A | Discharge status: Home / Self Care | Payer: Self-pay | Source: Ambulatory Visit | Attending: Cardiology | Admitting: Cardiology

## 2013-06-18 ENCOUNTER — Encounter (HOSPITAL_COMMUNITY): Payer: 59

## 2013-06-18 DIAGNOSIS — Z5189 Encounter for other specified aftercare: Secondary | ICD-10-CM | POA: Insufficient documentation

## 2013-06-18 DIAGNOSIS — I2510 Atherosclerotic heart disease of native coronary artery without angina pectoris: Secondary | ICD-10-CM | POA: Insufficient documentation

## 2013-06-18 DIAGNOSIS — I252 Old myocardial infarction: Secondary | ICD-10-CM | POA: Insufficient documentation

## 2013-06-18 DIAGNOSIS — Z951 Presence of aortocoronary bypass graft: Secondary | ICD-10-CM | POA: Insufficient documentation

## 2013-06-19 ENCOUNTER — Encounter (HOSPITAL_COMMUNITY)
Admission: RE | Admit: 2013-06-19 | Discharge: 2013-06-19 | Disposition: A | Discharge status: Home / Self Care | Payer: Self-pay | Source: Ambulatory Visit | Attending: Cardiology | Admitting: Cardiology

## 2013-06-20 ENCOUNTER — Encounter (HOSPITAL_COMMUNITY): Payer: 59

## 2013-06-20 ENCOUNTER — Encounter (HOSPITAL_COMMUNITY)
Admission: RE | Admit: 2013-06-20 | Discharge: 2013-06-20 | Disposition: A | Discharge status: Home / Self Care | Payer: Self-pay | Source: Ambulatory Visit | Attending: Cardiology | Admitting: Cardiology

## 2013-06-21 ENCOUNTER — Encounter (HOSPITAL_COMMUNITY): Payer: 59

## 2013-06-24 ENCOUNTER — Ambulatory Visit (INDEPENDENT_AMBULATORY_CARE_PROVIDER_SITE_OTHER): Payer: 59 | Admitting: Cardiology

## 2013-06-24 ENCOUNTER — Encounter: Payer: Self-pay | Admitting: Cardiology

## 2013-06-24 VITALS — BP 125/73 | HR 103 | Ht 71.0 in | Wt 185.0 lb

## 2013-06-24 DIAGNOSIS — I2510 Atherosclerotic heart disease of native coronary artery without angina pectoris: Secondary | ICD-10-CM

## 2013-06-24 DIAGNOSIS — I10 Essential (primary) hypertension: Secondary | ICD-10-CM

## 2013-06-24 DIAGNOSIS — I639 Cerebral infarction, unspecified: Secondary | ICD-10-CM | POA: Insufficient documentation

## 2013-06-24 DIAGNOSIS — G4700 Insomnia, unspecified: Secondary | ICD-10-CM | POA: Insufficient documentation

## 2013-06-24 DIAGNOSIS — E7800 Pure hypercholesterolemia, unspecified: Secondary | ICD-10-CM

## 2013-06-24 DIAGNOSIS — I714 Abdominal aortic aneurysm, without rupture: Secondary | ICD-10-CM | POA: Insufficient documentation

## 2013-06-24 NOTE — Progress Notes (Signed)
[address] [place], [place]  [address] Phone: [phone] Fax:  [phone]  Date:  06/24/2013   ID:  [name], DOB Jul 20, 1951, MRN [id number]  PCP:  Darrow Bussing, MD  Cardiologist:  Armanda Magic, MD     History of Present Illness: [name] is a 62 y.o. male with a history of CAD/HTN and lipids.  He is doing well.  He denies any anginal chest pain, SOB, DOE, LE edema, dizziness, palpitations or syncope.    Wt Readings from Last 3 Encounters:  06/24/13 185 lb (83.915 kg)  03/20/13 180 lb (81.647 kg)  02/06/13 181 lb (82.101 kg)     Past Medical History  Diagnosis Date  . Erectile dysfunction   . Dyslipidemia   . Chronic kidney disease     kidney stones s/p lithotripsy  . Cancer     mediastinal seminoma resected 1985 - benign  . Chest pain   . Shortness of breath   . GERD (gastroesophageal reflux disease)   . NSTEMI (non-ST elevated myocardial infarction)   . Insomnia   . TIA (transient ischemic attack)   . AAA (abdominal aortic aneurysm)     (-)11/11,CT abdomen-no aneurysm aortic  . Diabetes mellitus     neuropathy  insulin dependent  . Insomnia   . Erectile dysfunction   . Hypertension   . Elevated cholesterol   . Stroke     TIA's  . Coronary artery disease 02/2012    60% mid RCA, 80% prox left circ, 99% distal left circ, normal LAD s/p PCI left circ/OM, 02/2012 -now s/p CABG    Current Outpatient Prescriptions  Medication Sig Dispense Refill  . amitriptyline (ELAVIL) 100 MG tablet Take 100 mg by mouth at bedtime.      Marland Kitchen aspirin EC 325 MG EC tablet Take 1 tablet (325 mg total) by mouth daily.  30 tablet  0  . B-D ULTRAFINE III SHORT PEN 31G X 8 MM MISC       . insulin aspart (NOVOLOG) 100 UNIT/ML injection Inject 6-14 Units into the skin 3 (three) times daily before meals. Patient's dose is done on a sliding scale. Usually takes 8 units, but dose varies depending on glucose level.      . insulin glargine (LANTUS) 100 UNIT/ML injection  Inject 40 Units into the skin at bedtime.      . iron polysaccharides (NIFEREX) 150 MG capsule Take 300 mg by mouth daily.       . metFORMIN (GLUCOPHAGE) 500 MG tablet Take 1,000 mg by mouth 2 (two) times daily with a meal.      . metoprolol succinate (TOPROL-XL) 50 MG 24 hr tablet Take 75 mg by mouth daily. Take with or immediately following a meal.      . Multiple Vitamin (MULTIVITAMIN) tablet Take 1 tablet by mouth daily.      . Omega-3 Fatty Acids (FISH OIL MAXIMUM STRENGTH) 1200 MG CAPS Take 3,000 mg by mouth 2 (two) times daily.       . ONE TOUCH ULTRA TEST test strip       . simvastatin (ZOCOR) 40 MG tablet Take 40 mg by mouth every evening.      . zolpidem (AMBIEN) 10 MG tablet Take 10 mg by mouth at bedtime as needed for sleep.        No current facility-administered medications for this visit.    Allergies:    Allergies  Allergen Reactions  . Lipitor [Atorvastatin] Other (See  Comments)    myalgias  . Adhesive [Tape] Rash    Social History:  The patient  reports that he quit smoking about 28 years ago. He has never used smokeless tobacco. He reports that he does not drink alcohol or use illicit drugs.   Family History:  The patient's family history includes Cancer in his brother; Diabetes in his father, mother, and sister; Heart failure in his mother; Hypertension in his mother.   ROS:  Please see the history of present illness.      All other systems reviewed and negative.   PHYSICAL EXAM: VS:  BP 125/73  Pulse 103  Ht 5\' 11"  (1.803 m)  Wt 185 lb (83.915 kg)  BMI 25.81 kg/m2  SpO2 98% Well nourished, well developed, in no acute distress HEENT: normal Neck: no JVD Cardiac:  normal S1, S2; RRR; no murmur Lungs:  clear to auscultation bilaterally, no wheezing, rhonchi or rales Abd: soft, nontender, no hepatomegaly Ext: no edema Skin: warm and dry Neuro:  CNs 2-12 intact, no focal abnormalities noted  EKG:  Sinus tachycardia at 102bpm  ASSESSMENT AND  PLAN:  1. ASCAD s/p CABG with no angina  - continue ASA and decrease to 81mg  daily 2. HTN  - continue metoprolol 3. Dyslipidemia - his last lipids 03/2013 showed an LDL of 59   - continue simvastatin 4.  Tachycardia - he just took his beta blocker a short while ago  Followup with me in 6 months  Signed, Armanda Magic, MD 06/24/2013 8:52 AM

## 2013-06-24 NOTE — Patient Instructions (Signed)
Your physician recommends that you continue on your current medications as directed. Please refer to the Current Medication list given to you today.  Your physician wants you to follow-up in: 6 Months with Dr Turner You will receive a reminder letter in the mail two months in advance. If you don't receive a letter, please call our office to schedule the follow-up appointment.  

## 2013-06-25 ENCOUNTER — Encounter (HOSPITAL_COMMUNITY): Payer: 59

## 2013-06-25 ENCOUNTER — Encounter (HOSPITAL_COMMUNITY)
Admission: RE | Admit: 2013-06-25 | Discharge: 2013-06-25 | Disposition: A | Discharge status: Home / Self Care | Payer: Self-pay | Source: Ambulatory Visit | Attending: Cardiology | Admitting: Cardiology

## 2013-06-26 ENCOUNTER — Encounter (HOSPITAL_COMMUNITY)
Admission: RE | Admit: 2013-06-26 | Discharge: 2013-06-26 | Disposition: A | Discharge status: Home / Self Care | Payer: Self-pay | Source: Ambulatory Visit | Attending: Cardiology | Admitting: Cardiology

## 2013-06-27 ENCOUNTER — Encounter (HOSPITAL_COMMUNITY): Payer: 59

## 2013-06-27 ENCOUNTER — Encounter (HOSPITAL_COMMUNITY)
Admission: RE | Admit: 2013-06-27 | Discharge: 2013-06-27 | Disposition: A | Discharge status: Home / Self Care | Payer: Self-pay | Source: Ambulatory Visit | Attending: Cardiology | Admitting: Cardiology

## 2013-06-28 ENCOUNTER — Encounter (HOSPITAL_COMMUNITY): Payer: 59

## 2013-07-02 ENCOUNTER — Encounter (HOSPITAL_COMMUNITY): Payer: 59

## 2013-07-02 ENCOUNTER — Encounter (HOSPITAL_COMMUNITY)
Admission: RE | Admit: 2013-07-02 | Discharge: 2013-07-02 | Disposition: A | Discharge status: Home / Self Care | Payer: Self-pay | Source: Ambulatory Visit | Attending: Cardiology | Admitting: Cardiology

## 2013-07-03 ENCOUNTER — Encounter (HOSPITAL_COMMUNITY)
Admission: RE | Admit: 2013-07-03 | Discharge: 2013-07-03 | Disposition: A | Discharge status: Home / Self Care | Payer: Self-pay | Source: Ambulatory Visit | Attending: Cardiology | Admitting: Cardiology

## 2013-07-04 ENCOUNTER — Encounter (HOSPITAL_COMMUNITY)
Admission: RE | Admit: 2013-07-04 | Discharge: 2013-07-04 | Disposition: A | Discharge status: Home / Self Care | Payer: Self-pay | Source: Ambulatory Visit | Attending: Cardiology | Admitting: Cardiology

## 2013-07-04 ENCOUNTER — Encounter (HOSPITAL_COMMUNITY): Payer: 59

## 2013-07-05 ENCOUNTER — Encounter (HOSPITAL_COMMUNITY): Payer: 59

## 2013-07-09 ENCOUNTER — Encounter (HOSPITAL_COMMUNITY): Payer: 59

## 2013-07-09 ENCOUNTER — Encounter (HOSPITAL_COMMUNITY)
Admission: RE | Admit: 2013-07-09 | Discharge: 2013-07-09 | Disposition: A | Discharge status: Home / Self Care | Payer: Self-pay | Source: Ambulatory Visit | Attending: Cardiology | Admitting: Cardiology

## 2013-07-10 ENCOUNTER — Encounter (HOSPITAL_COMMUNITY)
Admission: RE | Admit: 2013-07-10 | Discharge: 2013-07-10 | Disposition: A | Discharge status: Home / Self Care | Payer: Self-pay | Source: Ambulatory Visit | Attending: Cardiology | Admitting: Cardiology

## 2013-07-11 ENCOUNTER — Encounter (HOSPITAL_COMMUNITY): Payer: 59

## 2013-07-12 ENCOUNTER — Encounter (HOSPITAL_COMMUNITY): Payer: 59

## 2013-07-16 ENCOUNTER — Encounter (HOSPITAL_COMMUNITY)
Admission: RE | Admit: 2013-07-16 | Discharge: 2013-07-16 | Disposition: A | Discharge status: Home / Self Care | Payer: Self-pay | Source: Ambulatory Visit | Attending: Cardiology | Admitting: Cardiology

## 2013-07-16 DIAGNOSIS — I2510 Atherosclerotic heart disease of native coronary artery without angina pectoris: Secondary | ICD-10-CM | POA: Insufficient documentation

## 2013-07-16 DIAGNOSIS — Z5189 Encounter for other specified aftercare: Secondary | ICD-10-CM | POA: Insufficient documentation

## 2013-07-16 DIAGNOSIS — Z951 Presence of aortocoronary bypass graft: Secondary | ICD-10-CM | POA: Insufficient documentation

## 2013-07-16 DIAGNOSIS — I252 Old myocardial infarction: Secondary | ICD-10-CM | POA: Insufficient documentation

## 2013-07-17 ENCOUNTER — Encounter (HOSPITAL_COMMUNITY): Payer: Self-pay

## 2013-07-18 ENCOUNTER — Encounter (HOSPITAL_COMMUNITY)
Admission: RE | Admit: 2013-07-18 | Discharge: 2013-07-18 | Disposition: A | Discharge status: Home / Self Care | Payer: Self-pay | Source: Ambulatory Visit | Attending: Cardiology | Admitting: Cardiology

## 2013-07-19 ENCOUNTER — Encounter (HOSPITAL_COMMUNITY)
Admission: RE | Admit: 2013-07-19 | Discharge: 2013-07-19 | Disposition: A | Discharge status: Home / Self Care | Payer: Self-pay | Source: Ambulatory Visit | Attending: Cardiology | Admitting: Cardiology

## 2013-07-23 ENCOUNTER — Encounter (HOSPITAL_COMMUNITY)
Admission: RE | Admit: 2013-07-23 | Discharge: 2013-07-23 | Disposition: A | Discharge status: Home / Self Care | Payer: Self-pay | Source: Ambulatory Visit | Attending: Cardiology | Admitting: Cardiology

## 2013-07-24 ENCOUNTER — Encounter (HOSPITAL_COMMUNITY)
Admission: RE | Admit: 2013-07-24 | Discharge: 2013-07-24 | Disposition: A | Discharge status: Home / Self Care | Payer: Self-pay | Source: Ambulatory Visit | Attending: Cardiology | Admitting: Cardiology

## 2013-07-25 ENCOUNTER — Encounter (HOSPITAL_COMMUNITY)
Admission: RE | Admit: 2013-07-25 | Discharge: 2013-07-25 | Disposition: A | Discharge status: Home / Self Care | Payer: Self-pay | Source: Ambulatory Visit | Attending: Cardiology | Admitting: Cardiology

## 2013-07-30 ENCOUNTER — Encounter (HOSPITAL_COMMUNITY)
Admission: RE | Admit: 2013-07-30 | Discharge: 2013-07-30 | Disposition: A | Discharge status: Home / Self Care | Payer: Self-pay | Source: Ambulatory Visit | Attending: Cardiology | Admitting: Cardiology

## 2013-07-31 ENCOUNTER — Encounter (HOSPITAL_COMMUNITY)
Admission: RE | Admit: 2013-07-31 | Discharge: 2013-07-31 | Disposition: A | Discharge status: Home / Self Care | Payer: Self-pay | Source: Ambulatory Visit | Attending: Cardiology | Admitting: Cardiology

## 2013-07-31 NOTE — Progress Notes (Addendum)
(  late entry)  Staff noticed  Right eye redness post exercise today at cardiac rehab.  Pt asymptomatic, denies pain, irritation, itching.  Pt has noticed this finding, so unsure when it occurred.  Eye has bloodshot appearance.  Pt is taking Aspirin daily.  Pt  Instructed to seek optometric evaluation.  Pt verbalized understanding and states he will schedule appointment.

## 2013-08-01 ENCOUNTER — Encounter (HOSPITAL_COMMUNITY)
Admission: RE | Admit: 2013-08-01 | Discharge: 2013-08-01 | Disposition: A | Discharge status: Home / Self Care | Payer: Self-pay | Source: Ambulatory Visit | Attending: Cardiology | Admitting: Cardiology

## 2013-08-06 ENCOUNTER — Encounter (HOSPITAL_COMMUNITY)
Admission: RE | Admit: 2013-08-06 | Discharge: 2013-08-06 | Disposition: A | Discharge status: Home / Self Care | Payer: Self-pay | Source: Ambulatory Visit | Attending: Cardiology | Admitting: Cardiology

## 2013-08-07 ENCOUNTER — Encounter (HOSPITAL_COMMUNITY)
Admission: RE | Admit: 2013-08-07 | Discharge: 2013-08-07 | Disposition: A | Discharge status: Home / Self Care | Payer: Self-pay | Source: Ambulatory Visit | Attending: Cardiology | Admitting: Cardiology

## 2013-08-13 ENCOUNTER — Encounter (HOSPITAL_COMMUNITY)
Admission: RE | Admit: 2013-08-13 | Discharge: 2013-08-13 | Disposition: A | Discharge status: Home / Self Care | Payer: Self-pay | Source: Ambulatory Visit | Attending: Cardiology | Admitting: Cardiology

## 2013-08-13 ENCOUNTER — Other Ambulatory Visit (INDEPENDENT_AMBULATORY_CARE_PROVIDER_SITE_OTHER): Payer: 59

## 2013-08-13 DIAGNOSIS — Z79899 Other long term (current) drug therapy: Secondary | ICD-10-CM

## 2013-08-13 DIAGNOSIS — E7800 Pure hypercholesterolemia, unspecified: Secondary | ICD-10-CM

## 2013-08-13 LAB — LIPID PANEL
HDL: 36 mg/dL — ABNORMAL LOW (ref >39.00)
Total CHOL/HDL Ratio: 3
Triglycerides: 93 mg/dL (ref 0.0–149.0)

## 2013-08-13 LAB — ALT: ALT: 40 U/L (ref 0–53)

## 2013-08-14 ENCOUNTER — Other Ambulatory Visit: Payer: Self-pay | Admitting: General Surgery

## 2013-08-14 ENCOUNTER — Encounter (HOSPITAL_COMMUNITY)
Admission: RE | Admit: 2013-08-14 | Discharge: 2013-08-14 | Disposition: A | Discharge status: Home / Self Care | Payer: Self-pay | Source: Ambulatory Visit | Attending: Cardiology | Admitting: Cardiology

## 2013-08-14 ENCOUNTER — Encounter: Payer: Self-pay | Admitting: General Surgery

## 2013-08-14 DIAGNOSIS — E785 Hyperlipidemia, unspecified: Secondary | ICD-10-CM

## 2013-08-16 ENCOUNTER — Encounter (HOSPITAL_COMMUNITY)
Admission: RE | Admit: 2013-08-16 | Discharge: 2013-08-16 | Disposition: A | Discharge status: Home / Self Care | Payer: Self-pay | Source: Ambulatory Visit | Attending: Cardiology | Admitting: Cardiology

## 2013-08-16 DIAGNOSIS — I252 Old myocardial infarction: Secondary | ICD-10-CM | POA: Insufficient documentation

## 2013-08-16 DIAGNOSIS — Z951 Presence of aortocoronary bypass graft: Secondary | ICD-10-CM | POA: Insufficient documentation

## 2013-08-16 DIAGNOSIS — Z5189 Encounter for other specified aftercare: Secondary | ICD-10-CM | POA: Insufficient documentation

## 2013-08-16 DIAGNOSIS — I2510 Atherosclerotic heart disease of native coronary artery without angina pectoris: Secondary | ICD-10-CM | POA: Insufficient documentation

## 2013-08-20 ENCOUNTER — Encounter (HOSPITAL_COMMUNITY)
Admission: RE | Admit: 2013-08-20 | Discharge: 2013-08-20 | Disposition: A | Discharge status: Home / Self Care | Payer: Self-pay | Source: Ambulatory Visit | Attending: Cardiology | Admitting: Cardiology

## 2013-08-21 ENCOUNTER — Encounter (HOSPITAL_COMMUNITY)
Admission: RE | Admit: 2013-08-21 | Discharge: 2013-08-21 | Disposition: A | Discharge status: Home / Self Care | Payer: Self-pay | Source: Ambulatory Visit | Attending: Cardiology | Admitting: Cardiology

## 2013-08-22 ENCOUNTER — Encounter (HOSPITAL_COMMUNITY)
Admission: RE | Admit: 2013-08-22 | Discharge: 2013-08-22 | Disposition: A | Discharge status: Home / Self Care | Payer: Self-pay | Source: Ambulatory Visit | Attending: Cardiology | Admitting: Cardiology

## 2013-08-27 ENCOUNTER — Encounter (HOSPITAL_COMMUNITY): Payer: Self-pay

## 2013-08-28 ENCOUNTER — Encounter (HOSPITAL_COMMUNITY)
Admission: RE | Admit: 2013-08-28 | Discharge: 2013-08-28 | Disposition: A | Discharge status: Home / Self Care | Payer: Self-pay | Source: Ambulatory Visit | Attending: Cardiology | Admitting: Cardiology

## 2013-08-29 ENCOUNTER — Encounter (HOSPITAL_COMMUNITY)
Admission: RE | Admit: 2013-08-29 | Discharge: 2013-08-29 | Disposition: A | Discharge status: Home / Self Care | Payer: Self-pay | Source: Ambulatory Visit | Attending: Cardiology | Admitting: Cardiology

## 2013-09-03 ENCOUNTER — Encounter (HOSPITAL_COMMUNITY)
Admission: RE | Admit: 2013-09-03 | Discharge: 2013-09-03 | Disposition: A | Discharge status: Home / Self Care | Payer: Self-pay | Source: Ambulatory Visit | Attending: Cardiology | Admitting: Cardiology

## 2013-09-04 ENCOUNTER — Encounter (HOSPITAL_COMMUNITY)
Admission: RE | Admit: 2013-09-04 | Discharge: 2013-09-04 | Disposition: A | Discharge status: Home / Self Care | Payer: Self-pay | Source: Ambulatory Visit | Attending: Cardiology | Admitting: Cardiology

## 2013-09-05 ENCOUNTER — Encounter (HOSPITAL_COMMUNITY)
Admission: RE | Admit: 2013-09-05 | Discharge: 2013-09-05 | Disposition: A | Discharge status: Home / Self Care | Payer: Self-pay | Source: Ambulatory Visit | Attending: Cardiology | Admitting: Cardiology

## 2013-09-06 ENCOUNTER — Encounter (HOSPITAL_COMMUNITY)
Admission: RE | Admit: 2013-09-06 | Discharge: 2013-09-06 | Disposition: A | Discharge status: Home / Self Care | Payer: Self-pay | Source: Ambulatory Visit | Attending: Cardiology | Admitting: Cardiology

## 2013-09-10 ENCOUNTER — Encounter (HOSPITAL_COMMUNITY)
Admission: RE | Admit: 2013-09-10 | Discharge: 2013-09-10 | Disposition: A | Discharge status: Home / Self Care | Payer: Self-pay | Source: Ambulatory Visit | Attending: Cardiology | Admitting: Cardiology

## 2013-09-11 ENCOUNTER — Encounter (HOSPITAL_COMMUNITY)
Admission: RE | Admit: 2013-09-11 | Discharge: 2013-09-11 | Disposition: A | Discharge status: Home / Self Care | Payer: Self-pay | Source: Ambulatory Visit | Attending: Cardiology | Admitting: Cardiology

## 2013-09-12 ENCOUNTER — Encounter (HOSPITAL_COMMUNITY)
Admission: RE | Admit: 2013-09-12 | Discharge: 2013-09-12 | Disposition: A | Discharge status: Home / Self Care | Payer: Self-pay | Source: Ambulatory Visit | Attending: Cardiology | Admitting: Cardiology

## 2013-09-17 ENCOUNTER — Encounter (HOSPITAL_COMMUNITY)
Admission: RE | Admit: 2013-09-17 | Discharge: 2013-09-17 | Disposition: A | Discharge status: Home / Self Care | Payer: Self-pay | Source: Ambulatory Visit | Attending: Cardiology | Admitting: Cardiology

## 2013-09-17 DIAGNOSIS — I252 Old myocardial infarction: Secondary | ICD-10-CM | POA: Insufficient documentation

## 2013-09-17 DIAGNOSIS — I2510 Atherosclerotic heart disease of native coronary artery without angina pectoris: Secondary | ICD-10-CM | POA: Insufficient documentation

## 2013-09-17 DIAGNOSIS — Z951 Presence of aortocoronary bypass graft: Secondary | ICD-10-CM | POA: Insufficient documentation

## 2013-09-17 DIAGNOSIS — Z5189 Encounter for other specified aftercare: Secondary | ICD-10-CM | POA: Insufficient documentation

## 2013-09-18 ENCOUNTER — Encounter (HOSPITAL_COMMUNITY)
Admission: RE | Admit: 2013-09-18 | Discharge: 2013-09-18 | Disposition: A | Discharge status: Home / Self Care | Payer: Self-pay | Source: Ambulatory Visit | Attending: Cardiology | Admitting: Cardiology

## 2013-09-19 ENCOUNTER — Encounter (HOSPITAL_COMMUNITY)
Admission: RE | Admit: 2013-09-19 | Discharge: 2013-09-19 | Disposition: A | Discharge status: Home / Self Care | Payer: Self-pay | Source: Ambulatory Visit | Attending: Cardiology | Admitting: Cardiology

## 2013-09-20 ENCOUNTER — Encounter (HOSPITAL_COMMUNITY)
Admission: RE | Admit: 2013-09-20 | Discharge: 2013-09-20 | Disposition: A | Discharge status: Home / Self Care | Payer: Self-pay | Source: Ambulatory Visit | Attending: Cardiology | Admitting: Cardiology

## 2013-09-24 ENCOUNTER — Encounter (HOSPITAL_COMMUNITY)
Admission: RE | Admit: 2013-09-24 | Discharge: 2013-09-24 | Disposition: A | Discharge status: Home / Self Care | Payer: Self-pay | Source: Ambulatory Visit | Attending: Cardiology | Admitting: Cardiology

## 2013-09-25 ENCOUNTER — Encounter (HOSPITAL_COMMUNITY)
Admission: RE | Admit: 2013-09-25 | Discharge: 2013-09-25 | Disposition: A | Discharge status: Home / Self Care | Payer: Self-pay | Source: Ambulatory Visit | Attending: Cardiology | Admitting: Cardiology

## 2013-09-26 ENCOUNTER — Encounter (HOSPITAL_COMMUNITY): Payer: Self-pay

## 2013-09-27 ENCOUNTER — Encounter (HOSPITAL_COMMUNITY)
Admission: RE | Admit: 2013-09-27 | Discharge: 2013-09-27 | Disposition: A | Discharge status: Home / Self Care | Payer: Self-pay | Source: Ambulatory Visit | Attending: Cardiology | Admitting: Cardiology

## 2013-10-01 ENCOUNTER — Encounter (HOSPITAL_COMMUNITY): Payer: Self-pay

## 2013-10-02 ENCOUNTER — Encounter (HOSPITAL_COMMUNITY): Payer: Self-pay

## 2013-10-03 ENCOUNTER — Encounter (HOSPITAL_COMMUNITY): Payer: Self-pay

## 2013-10-07 ENCOUNTER — Other Ambulatory Visit: Payer: Self-pay | Admitting: Gastroenterology

## 2013-10-07 NOTE — Addendum Note (Signed)
Addended by: Vernie Ammons. on: 10/07/2013 11:29 AM   Modules accepted: Orders

## 2013-10-08 ENCOUNTER — Encounter (HOSPITAL_COMMUNITY): Payer: Self-pay

## 2013-10-09 ENCOUNTER — Encounter (HOSPITAL_COMMUNITY): Admission: RE | Payer: Self-pay | Source: Ambulatory Visit

## 2013-10-09 ENCOUNTER — Encounter (HOSPITAL_COMMUNITY)
Admission: RE | Admit: 2013-10-09 | Discharge: 2013-10-09 | Disposition: A | Discharge status: Home / Self Care | Payer: Self-pay | Source: Ambulatory Visit | Attending: Cardiology | Admitting: Cardiology

## 2013-10-09 ENCOUNTER — Ambulatory Visit (HOSPITAL_COMMUNITY): Admission: RE | Admit: 2013-10-09 | Payer: 59 | Source: Ambulatory Visit | Admitting: Gastroenterology

## 2013-10-09 SURGERY — EGD (ESOPHAGOGASTRODUODENOSCOPY)
Anesthesia: Moderate Sedation

## 2013-10-10 ENCOUNTER — Encounter (HOSPITAL_COMMUNITY): Payer: Self-pay

## 2013-10-11 ENCOUNTER — Encounter (HOSPITAL_COMMUNITY)
Admission: RE | Admit: 2013-10-11 | Discharge: 2013-10-11 | Disposition: A | Discharge status: Home / Self Care | Payer: Self-pay | Source: Ambulatory Visit | Attending: Cardiology | Admitting: Cardiology

## 2013-10-15 ENCOUNTER — Encounter (HOSPITAL_COMMUNITY)
Admission: RE | Admit: 2013-10-15 | Discharge: 2013-10-15 | Disposition: A | Discharge status: Home / Self Care | Payer: Self-pay | Source: Ambulatory Visit | Attending: Cardiology | Admitting: Cardiology

## 2013-10-15 DIAGNOSIS — I2510 Atherosclerotic heart disease of native coronary artery without angina pectoris: Secondary | ICD-10-CM | POA: Insufficient documentation

## 2013-10-15 DIAGNOSIS — Z5189 Encounter for other specified aftercare: Secondary | ICD-10-CM | POA: Insufficient documentation

## 2013-10-15 DIAGNOSIS — Z951 Presence of aortocoronary bypass graft: Secondary | ICD-10-CM | POA: Insufficient documentation

## 2013-10-15 DIAGNOSIS — I252 Old myocardial infarction: Secondary | ICD-10-CM | POA: Insufficient documentation

## 2013-10-16 ENCOUNTER — Encounter (HOSPITAL_COMMUNITY)
Admission: RE | Admit: 2013-10-16 | Discharge: 2013-10-16 | Disposition: A | Discharge status: Home / Self Care | Payer: Self-pay | Source: Ambulatory Visit | Attending: Cardiology | Admitting: Cardiology

## 2013-10-17 ENCOUNTER — Encounter (HOSPITAL_COMMUNITY)
Admission: RE | Admit: 2013-10-17 | Discharge: 2013-10-17 | Disposition: A | Discharge status: Home / Self Care | Payer: Self-pay | Source: Ambulatory Visit | Attending: Cardiology | Admitting: Cardiology

## 2013-10-18 ENCOUNTER — Encounter (HOSPITAL_COMMUNITY)
Admission: RE | Admit: 2013-10-18 | Discharge: 2013-10-18 | Disposition: A | Discharge status: Home / Self Care | Payer: Self-pay | Source: Ambulatory Visit | Attending: Cardiology | Admitting: Cardiology

## 2013-10-22 ENCOUNTER — Encounter (HOSPITAL_COMMUNITY)
Admission: RE | Admit: 2013-10-22 | Discharge: 2013-10-22 | Disposition: A | Discharge status: Home / Self Care | Payer: Self-pay | Source: Ambulatory Visit | Attending: Cardiology | Admitting: Cardiology

## 2013-10-23 ENCOUNTER — Encounter (HOSPITAL_COMMUNITY)
Admission: RE | Admit: 2013-10-23 | Discharge: 2013-10-23 | Disposition: A | Discharge status: Home / Self Care | Payer: Self-pay | Source: Ambulatory Visit | Attending: Cardiology | Admitting: Cardiology

## 2013-10-24 ENCOUNTER — Encounter (HOSPITAL_COMMUNITY)
Admission: RE | Admit: 2013-10-24 | Discharge: 2013-10-24 | Disposition: A | Discharge status: Home / Self Care | Payer: Self-pay | Source: Ambulatory Visit | Attending: Cardiology | Admitting: Cardiology

## 2013-10-29 ENCOUNTER — Encounter (HOSPITAL_COMMUNITY)
Admission: RE | Admit: 2013-10-29 | Discharge: 2013-10-29 | Disposition: A | Discharge status: Home / Self Care | Payer: Self-pay | Source: Ambulatory Visit | Attending: Cardiology | Admitting: Cardiology

## 2013-10-30 ENCOUNTER — Encounter (HOSPITAL_COMMUNITY)
Admission: RE | Admit: 2013-10-30 | Discharge: 2013-10-30 | Disposition: A | Discharge status: Home / Self Care | Payer: Self-pay | Source: Ambulatory Visit | Attending: Cardiology | Admitting: Cardiology

## 2013-10-31 ENCOUNTER — Encounter (HOSPITAL_COMMUNITY): Payer: Self-pay

## 2013-11-05 ENCOUNTER — Encounter (HOSPITAL_COMMUNITY)
Admission: RE | Admit: 2013-11-05 | Discharge: 2013-11-05 | Disposition: A | Discharge status: Home / Self Care | Payer: Self-pay | Source: Ambulatory Visit | Attending: Cardiology | Admitting: Cardiology

## 2013-11-06 ENCOUNTER — Encounter (HOSPITAL_COMMUNITY)
Admission: RE | Admit: 2013-11-06 | Discharge: 2013-11-06 | Disposition: A | Discharge status: Home / Self Care | Payer: Self-pay | Source: Ambulatory Visit | Attending: Cardiology | Admitting: Cardiology

## 2013-11-07 ENCOUNTER — Encounter (HOSPITAL_COMMUNITY)
Admission: RE | Admit: 2013-11-07 | Discharge: 2013-11-07 | Disposition: A | Discharge status: Home / Self Care | Payer: Self-pay | Source: Ambulatory Visit | Attending: Cardiology | Admitting: Cardiology

## 2013-11-12 ENCOUNTER — Encounter (HOSPITAL_COMMUNITY)
Admission: RE | Admit: 2013-11-12 | Discharge: 2013-11-12 | Disposition: A | Discharge status: Home / Self Care | Payer: Self-pay | Source: Ambulatory Visit | Attending: Cardiology | Admitting: Cardiology

## 2013-11-13 ENCOUNTER — Encounter (HOSPITAL_COMMUNITY): Payer: Self-pay

## 2013-11-13 DIAGNOSIS — I252 Old myocardial infarction: Secondary | ICD-10-CM | POA: Insufficient documentation

## 2013-11-13 DIAGNOSIS — Z951 Presence of aortocoronary bypass graft: Secondary | ICD-10-CM | POA: Insufficient documentation

## 2013-11-13 DIAGNOSIS — Z5189 Encounter for other specified aftercare: Secondary | ICD-10-CM | POA: Insufficient documentation

## 2013-11-13 DIAGNOSIS — I2510 Atherosclerotic heart disease of native coronary artery without angina pectoris: Secondary | ICD-10-CM | POA: Insufficient documentation

## 2013-11-14 ENCOUNTER — Encounter (HOSPITAL_COMMUNITY)
Admission: RE | Admit: 2013-11-14 | Discharge: 2013-11-14 | Disposition: A | Discharge status: Home / Self Care | Payer: Self-pay | Source: Ambulatory Visit | Attending: Cardiology | Admitting: Cardiology

## 2013-11-15 ENCOUNTER — Encounter (HOSPITAL_COMMUNITY)
Admission: RE | Admit: 2013-11-15 | Discharge: 2013-11-15 | Disposition: A | Discharge status: Home / Self Care | Payer: Self-pay | Source: Ambulatory Visit | Attending: Cardiology | Admitting: Cardiology

## 2013-11-19 ENCOUNTER — Encounter (HOSPITAL_COMMUNITY): Payer: Self-pay

## 2013-11-20 ENCOUNTER — Encounter (HOSPITAL_COMMUNITY)
Admission: RE | Admit: 2013-11-20 | Discharge: 2013-11-20 | Disposition: A | Discharge status: Home / Self Care | Payer: Self-pay | Source: Ambulatory Visit | Attending: Cardiology | Admitting: Cardiology

## 2013-11-21 ENCOUNTER — Encounter (HOSPITAL_COMMUNITY): Payer: Self-pay

## 2013-11-22 ENCOUNTER — Encounter (HOSPITAL_COMMUNITY)
Admission: RE | Admit: 2013-11-22 | Discharge: 2013-11-22 | Disposition: A | Discharge status: Home / Self Care | Payer: Self-pay | Source: Ambulatory Visit | Attending: Cardiology | Admitting: Cardiology

## 2013-11-26 ENCOUNTER — Encounter (HOSPITAL_COMMUNITY): Payer: Self-pay

## 2013-11-27 ENCOUNTER — Encounter (HOSPITAL_COMMUNITY): Payer: Self-pay

## 2013-11-28 ENCOUNTER — Encounter (HOSPITAL_COMMUNITY): Payer: Self-pay

## 2013-11-28 ENCOUNTER — Other Ambulatory Visit: Payer: Self-pay | Admitting: Gastroenterology

## 2013-11-29 ENCOUNTER — Encounter (HOSPITAL_COMMUNITY)
Admission: RE | Admit: 2013-11-29 | Discharge: 2013-11-29 | Disposition: A | Discharge status: Home / Self Care | Payer: Self-pay | Source: Ambulatory Visit | Attending: Cardiology | Admitting: Cardiology

## 2013-11-29 NOTE — Addendum Note (Signed)
Addended by: Vernie Ammons. on: 11/29/2013 08:08 AM   Modules accepted: Orders

## 2013-12-02 ENCOUNTER — Encounter (HOSPITAL_COMMUNITY)
Admission: RE | Disposition: A | Discharge status: Home / Self Care | Payer: Self-pay | Source: Ambulatory Visit | Attending: Gastroenterology

## 2013-12-02 ENCOUNTER — Ambulatory Visit (HOSPITAL_COMMUNITY)
Admission: RE | Admit: 2013-12-02 | Discharge: 2013-12-02 | Disposition: A | Discharge status: Home / Self Care | Payer: 59 | Source: Ambulatory Visit | Attending: Gastroenterology | Admitting: Gastroenterology

## 2013-12-02 ENCOUNTER — Encounter (HOSPITAL_COMMUNITY): Payer: Self-pay

## 2013-12-02 DIAGNOSIS — Z794 Long term (current) use of insulin: Secondary | ICD-10-CM | POA: Insufficient documentation

## 2013-12-02 DIAGNOSIS — K219 Gastro-esophageal reflux disease without esophagitis: Secondary | ICD-10-CM | POA: Insufficient documentation

## 2013-12-02 DIAGNOSIS — Z7982 Long term (current) use of aspirin: Secondary | ICD-10-CM | POA: Insufficient documentation

## 2013-12-02 DIAGNOSIS — I2510 Atherosclerotic heart disease of native coronary artery without angina pectoris: Secondary | ICD-10-CM | POA: Insufficient documentation

## 2013-12-02 DIAGNOSIS — Z7902 Long term (current) use of antithrombotics/antiplatelets: Secondary | ICD-10-CM | POA: Insufficient documentation

## 2013-12-02 DIAGNOSIS — E785 Hyperlipidemia, unspecified: Secondary | ICD-10-CM | POA: Insufficient documentation

## 2013-12-02 DIAGNOSIS — I10 Essential (primary) hypertension: Secondary | ICD-10-CM | POA: Insufficient documentation

## 2013-12-02 DIAGNOSIS — Z8673 Personal history of transient ischemic attack (TIA), and cerebral infarction without residual deficits: Secondary | ICD-10-CM | POA: Insufficient documentation

## 2013-12-02 DIAGNOSIS — Z79899 Other long term (current) drug therapy: Secondary | ICD-10-CM | POA: Insufficient documentation

## 2013-12-02 DIAGNOSIS — I252 Old myocardial infarction: Secondary | ICD-10-CM | POA: Insufficient documentation

## 2013-12-02 DIAGNOSIS — K6289 Other specified diseases of anus and rectum: Secondary | ICD-10-CM | POA: Insufficient documentation

## 2013-12-02 DIAGNOSIS — K319 Disease of stomach and duodenum, unspecified: Secondary | ICD-10-CM | POA: Insufficient documentation

## 2013-12-02 DIAGNOSIS — Z951 Presence of aortocoronary bypass graft: Secondary | ICD-10-CM | POA: Insufficient documentation

## 2013-12-02 DIAGNOSIS — E119 Type 2 diabetes mellitus without complications: Secondary | ICD-10-CM | POA: Insufficient documentation

## 2013-12-02 DIAGNOSIS — R195 Other fecal abnormalities: Secondary | ICD-10-CM | POA: Insufficient documentation

## 2013-12-02 HISTORY — PX: ESOPHAGOGASTRODUODENOSCOPY: SHX5428

## 2013-12-02 HISTORY — PX: HOT HEMOSTASIS: SHX5433

## 2013-12-02 HISTORY — PX: COLONOSCOPY: SHX5424

## 2013-12-02 LAB — GLUCOSE, CAPILLARY: Glucose-Capillary: 157 mg/dL — ABNORMAL HIGH (ref 70–99)

## 2013-12-02 SURGERY — EGD (ESOPHAGOGASTRODUODENOSCOPY)
Anesthesia: Moderate Sedation

## 2013-12-02 MED ORDER — FENTANYL CITRATE 0.05 MG/ML IJ SOLN
INTRAMUSCULAR | Status: AC
  Filled 2013-12-02: qty 2

## 2013-12-02 MED ORDER — SODIUM CHLORIDE 0.9 % IV SOLN: INTRAVENOUS | Status: DC

## 2013-12-02 MED ORDER — MIDAZOLAM HCL 10 MG/2ML IJ SOLN
INTRAMUSCULAR | Status: DC | PRN
  Administered 2013-12-02 (×2): 2 mg via INTRAVENOUS

## 2013-12-02 MED ORDER — MIDAZOLAM HCL 10 MG/2ML IJ SOLN
INTRAMUSCULAR | Status: AC
  Filled 2013-12-02: qty 2

## 2013-12-02 MED ORDER — BUTAMBEN-TETRACAINE-BENZOCAINE 2-2-14 % EX AERO
INHALATION_SPRAY | CUTANEOUS | Status: DC | PRN
  Administered 2013-12-02: 2 via TOPICAL

## 2013-12-02 MED ORDER — FENTANYL CITRATE 0.05 MG/ML IJ SOLN
INTRAMUSCULAR | Status: DC | PRN
  Administered 2013-12-02: 12.5 ug via INTRAVENOUS
  Administered 2013-12-02: 25 ug via INTRAVENOUS

## 2013-12-02 NOTE — Op Note (Signed)
[facility] [place], [address]   ENDOSCOPY PROCEDURE REPORT  PATIENT: [name], [name]  MR#: [id number] BIRTHDATE: 03-21-1951 , 67  yrs. old GENDER: Male ENDOSCOPIST:Worley Radermacher Oletta Lamas, MD REFERRED BY:  Dr Dorthy Cooler, Dr. Radford Pax PROCEDURE DATE:  12/02/2013 PROCEDURE:      EGD with Biopsy ASA CLASS:     class 2 INDICATIONS:63 year old gentleman who has coronary disease and has been on those anticoagulants in antiplatelet agents. He said persistent heme positive stool. Year and half ago an EGD and colonoscopy performed or grossly normal MEDICATION:   fentanyl 50 mcg, versed 3 mg IV TOPICAL ANESTHETIC:  cetacaine spray  DESCRIPTION OF PROCEDURE:   The Pentax upper scope was inserted finally into the esophagus with swallowing. The esophagus was normal with no ulceration's, esophagitis, varices etc. The stomach were centered and examined in the forward and in the retroflex view. The GE junction was normal. There was mild antral inflammation with possibly some erosions. This was mild and was located in the antrum. Several biopsies were obtained. The duodenum was seen well. To duodenal bulb and 2nd duodenum were normal with no ulceration, inflammation, or active bleeding. The scope is withdrawn the patient tolerated procedure well.     COMPLICATIONS: None  ENDOSCOPIC IMPRESSION: 1. Mild Antral Gastritis. This could have potentially caused positive stool. This was biopsied. 2. Stool positive for FOB.  RECOMMENDATIONS: 1. will proceed with colonoscopy at this time. 2. We'll recommend that you take omeprazole 20 mg daily 3. Will follow in the office in about 6 weeks.    _______________________________ Lorrin MaisLaurence Spates, MD 12/02/2013 10:25 AM CC: Dr Dorthy Cooler, Dr. Radford Pax

## 2013-12-02 NOTE — H&P (Addendum)
Subjective:   Patient is a 63 y.o. male presents with persistent heme positive stools. He is been on aspirin Plavix. At EGD and colonoscopy about a year and a half brother were grossly negative but the colonoscopy prep was not particularly good. It is persistently positive stools EGD and colonoscopy are going to be repeated. Procedure including risks and benefits discussed in office.  Patient Active Problem List   Diagnosis Date Noted  . AAA (abdominal aortic aneurysm)   . Diabetes mellitus   . Insomnia   . Elevated cholesterol   . Stroke   . Hypertension   . TIA (transient ischemic attack)   . DM (diabetes mellitus) 12/28/2012  . Dyslipidemia 12/28/2012  . Coronary artery disease 02/13/2012   Past Medical History  Diagnosis Date  . Erectile dysfunction   . Dyslipidemia   . Chronic kidney disease     kidney stones s/p lithotripsy  . Cancer     mediastinal seminoma resected 1985 - benign  . Chest pain   . Shortness of breath   . GERD (gastroesophageal reflux disease)   . NSTEMI (non-ST elevated myocardial infarction)   . Insomnia   . TIA (transient ischemic attack)   . AAA (abdominal aortic aneurysm)     (-)11/11,CT abdomen-no aneurysm aortic  . Diabetes mellitus     neuropathy  insulin dependent  . Insomnia   . Erectile dysfunction   . Hypertension   . Elevated cholesterol   . Stroke     TIA's  . Coronary artery disease 02/2012    60% mid RCA, 80% prox left circ, 99% distal left circ, normal LAD s/p PCI left circ/OM, 02/2012 -now s/p CABG    Past Surgical History  Procedure Laterality Date  . Resection mediastinal seminonma    . Lithotripsy    . Esophagogastroduodenoscopy  07/20/2012    Procedure: ESOPHAGOGASTRODUODENOSCOPY (EGD);  Surgeon: Winfield Cunas., MD;  Location: [facility] ENDOSCOPY;  Service: Endoscopy;  Laterality: N/A;  . Colonoscopy  07/25/2012    Procedure: COLONOSCOPY;  Surgeon: Winfield Cunas., MD;  Location: [facility] ENDOSCOPY;  Service: Endoscopy;   Laterality: N/A;  . Coronary artery bypass graft N/A 01/04/2013    Procedure: CORONARY ARTERY BYPASS GRAFTING (CABG) times two using right saphenous vein harvested with endoscope.;  Surgeon: Ivin Poot, MD;  Location: [place];  Service: Open Heart Surgery;  Laterality: N/A;  . Intraoperative transesophageal echocardiogram N/A 01/04/2013    Procedure: INTRAOPERATIVE TRANSESOPHAGEAL ECHOCARDIOGRAM;  Surgeon: Ivin Poot, MD;  Location: [place];  Service: Open Heart Surgery;  Laterality: N/A;  . Radial artery harvest Left 01/04/2013    Procedure: RADIAL ARTERY HARVEST;  Surgeon: Ivin Poot, MD;  Location: [place];  Service: Vascular;  Laterality: Left;  Artery not havested. Unsuitable for use.    Prescriptions prior to admission  Medication Sig Dispense Refill  . amitriptyline (ELAVIL) 100 MG tablet Take 100 mg by mouth at bedtime.      Marland Kitchen aspirin EC 325 MG EC tablet Take 1 tablet (325 mg total) by mouth daily.  30 tablet  0  . insulin aspart (NOVOLOG) 100 UNIT/ML injection Inject 6-14 Units into the skin 3 (three) times daily before meals. Patient's dose is done on a sliding scale. Usually takes 8 units, but dose varies depending on glucose level.      . insulin glargine (LANTUS) 100 UNIT/ML injection Inject 40 Units into the skin at bedtime.      . iron polysaccharides (NIFEREX) 150  MG capsule Take 300 mg by mouth daily.       . metFORMIN (GLUCOPHAGE) 500 MG tablet Take 1,000 mg by mouth 2 (two) times daily with a meal.      . metoprolol succinate (TOPROL-XL) 50 MG 24 hr tablet Take 75 mg by mouth daily. Take with or immediately following a meal.      . Multiple Vitamin (MULTIVITAMIN) tablet Take 1 tablet by mouth daily.      . Omega-3 Fatty Acids (FISH OIL MAXIMUM STRENGTH) 1200 MG CAPS Take 3,000 mg by mouth 2 (two) times daily.       . ONE TOUCH ULTRA TEST test strip       . simvastatin (ZOCOR) 40 MG tablet Take 40 mg by mouth every evening.      . zolpidem (AMBIEN) 10 MG tablet Take 10 mg  by mouth at bedtime as needed for sleep.       . B-D ULTRAFINE III SHORT PEN 31G X 8 MM MISC        Allergies  Allergen Reactions  . Lipitor [Atorvastatin] Other (See Comments)    myalgias  . Adhesive [Tape] Rash    History  Substance Use Topics  . Smoking status: Former Smoker    Quit date: 03/06/1985  . Smokeless tobacco: Never Used  . Alcohol Use: No    Family History  Problem Relation Age of Onset  . Diabetes Father   . Diabetes Mother   . Heart failure Mother   . Hypertension Mother   . Cancer Brother   . Diabetes Sister      Objective:   Patient Vitals for the past 8 hrs:  BP Temp Temp src Pulse Resp SpO2  12/02/13 0825 137/83 mmHg 97.8 F (36.6 C) Oral 100 22 99 %         See MD Preop evaluation      Assessment:   1. Persistent heme positive stools.  Plan:   Will proceed with EGD and colonoscopy. Have discussed fulgeration APC of AVM if this is found in felt to be the cause of bleeding. Procedure discussed extensively again in the office  Discusseds again.

## 2013-12-02 NOTE — Discharge Instructions (Addendum)
Resume previous diet and medications. Repeat colonoscopy in 10 years. Resume yearly stool checks for blood in 3 years   BEGIN PRILOSEC OTC 20 mg 1 tab dailyGastrointestinal Endoscopy Care After Refer to this sheet in the next few weeks. These instructions provide you with information on caring for yourself after your procedure. Your caregiver may also give you more specific instructions. Your treatment has been planned according to current medical practices, but problems sometimes occur. Call your caregiver if you have any problems or questions after your procedure. HOME CARE INSTRUCTIONS  If you were given medicine to help you relax (sedative), do not drive, operate machinery, or sign important documents for 24 hours.  Avoid alcohol and hot or warm beverages for the first 24 hours after the procedure.  Only take over-the-counter or prescription medicines for pain, discomfort, or fever as directed by your caregiver. You may resume taking your normal medicines unless your caregiver tells you otherwise. Ask your caregiver when you may resume taking medicines that may cause bleeding, such as aspirin, clopidogrel, or warfarin.  You may return to your normal diet and activities on the day after your procedure, or as directed by your caregiver. Walking may help to reduce any bloated feeling in your abdomen.  Drink enough fluids to keep your urine clear or pale yellow.  You may gargle with salt water if you have a sore throat. SEEK IMMEDIATE MEDICAL CARE IF:  You have severe nausea or vomiting.  You have severe abdominal pain, abdominal cramps that last longer than 6 hours, or abdominal swelling (distention).  You have severe shoulder or back pain.  You have trouble swallowing.  You have shortness of breath, your breathing is shallow, or you are breathing faster than normal.  You have a fever or a rapid heartbeat.  You vomit blood or material that looks like coffee grounds.  You have  bloody, black, or tarry stools. MAKE SURE YOU:  Understand these instructions.  Will watch your condition.  Will get help right away if you are not doing well or get worse. Document Released: 03/15/2004 Document Revised: 01/31/2012 Document Reviewed: 11/01/2011 [facility] Patient Information 2014 [place], [place].

## 2013-12-02 NOTE — Op Note (Signed)
[facility] [place], [address]   COLONOSCOPY PROCEDURE REPORT  PATIENT: [name], [name]  MR#: [id number] BIRTHDATE: 1950/12/05 , 66  yrs. old GENDER: Male ENDOSCOPIST: Laurence Spates, MD REFERRED BY:  Dr. Dorthy Cooler, Dr. Radford Pax PROCEDURE DATE:  12/02/2013 PROCEDURE:    Colonoscopy ASA CLASS:    class 2 INDICATIONS:  heme positive stool, persistent. Please see EGD note. MEDICATIONS:fentanyl 75 mcg, versed 5 mg IV  DESCRIPTION OF PROCEDURE: Following the EGD, a digital rectal exam was performed in the Pentax adult colonoscope was inserted. The prep was good there was a minimal solid stool. There was some liquid that had to be suctioned. Using abdominal pressure we were able to reach the cecum. The appendiceal orifice and the ICV were seen and photographed. The scope was withdrawn and the colon carefully examine. No polyps, masses, diverticula, or AVMs were seen. There were no signs of active bleeding. There was some perianal irritation consistent with mild hemorrhoids but no active bleeding. The scope was withdrawn the patient tolerated procedure well there were no immediate complications.     COMPLICATIONS: None  ENDOSCOPIC IMPRESSION: 1. Heme Positive Stool. No obvious abnormalities on colonoscopy of concern 2. Mild perianal irritation. Probably from hemorrhoids RECOMMENDATIONS: 1. we'll see back in the office in about 6 weeks.    _______________________________ Lorrin MaisLaurence Spates, MD 12/02/2013 10:31 AM CC: Dr. Dorthy Cooler, Dr. Radford Pax

## 2013-12-03 ENCOUNTER — Encounter (HOSPITAL_COMMUNITY)
Admission: RE | Admit: 2013-12-03 | Discharge: 2013-12-03 | Disposition: A | Discharge status: Home / Self Care | Payer: Self-pay | Source: Ambulatory Visit | Attending: Cardiology | Admitting: Cardiology

## 2013-12-03 ENCOUNTER — Encounter (HOSPITAL_COMMUNITY): Payer: Self-pay | Admitting: Gastroenterology

## 2013-12-04 ENCOUNTER — Encounter (HOSPITAL_COMMUNITY)
Admission: RE | Admit: 2013-12-04 | Discharge: 2013-12-04 | Disposition: A | Discharge status: Home / Self Care | Payer: Self-pay | Source: Ambulatory Visit | Attending: Cardiology | Admitting: Cardiology

## 2013-12-05 ENCOUNTER — Encounter (HOSPITAL_COMMUNITY): Payer: Self-pay

## 2013-12-06 ENCOUNTER — Encounter (HOSPITAL_COMMUNITY)
Admission: RE | Admit: 2013-12-06 | Discharge: 2013-12-06 | Disposition: A | Discharge status: Home / Self Care | Payer: Self-pay | Source: Ambulatory Visit | Attending: Cardiology | Admitting: Cardiology

## 2013-12-10 ENCOUNTER — Encounter (HOSPITAL_COMMUNITY)
Admission: RE | Admit: 2013-12-10 | Discharge: 2013-12-10 | Disposition: A | Discharge status: Home / Self Care | Payer: Self-pay | Source: Ambulatory Visit | Attending: Cardiology | Admitting: Cardiology

## 2013-12-11 ENCOUNTER — Encounter (HOSPITAL_COMMUNITY): Payer: Self-pay

## 2013-12-12 ENCOUNTER — Encounter (HOSPITAL_COMMUNITY): Payer: Self-pay

## 2013-12-13 ENCOUNTER — Encounter (HOSPITAL_COMMUNITY)
Admission: RE | Admit: 2013-12-13 | Discharge: 2013-12-13 | Disposition: A | Discharge status: Home / Self Care | Payer: Self-pay | Source: Ambulatory Visit | Attending: Cardiology | Admitting: Cardiology

## 2013-12-13 DIAGNOSIS — I6350 Cerebral infarction due to unspecified occlusion or stenosis of unspecified cerebral artery: Secondary | ICD-10-CM | POA: Insufficient documentation

## 2013-12-13 DIAGNOSIS — E785 Hyperlipidemia, unspecified: Secondary | ICD-10-CM | POA: Insufficient documentation

## 2013-12-13 DIAGNOSIS — Z5189 Encounter for other specified aftercare: Secondary | ICD-10-CM | POA: Insufficient documentation

## 2013-12-13 DIAGNOSIS — I10 Essential (primary) hypertension: Secondary | ICD-10-CM | POA: Insufficient documentation

## 2013-12-13 DIAGNOSIS — I2510 Atherosclerotic heart disease of native coronary artery without angina pectoris: Secondary | ICD-10-CM | POA: Insufficient documentation

## 2013-12-13 DIAGNOSIS — I714 Abdominal aortic aneurysm, without rupture, unspecified: Secondary | ICD-10-CM | POA: Insufficient documentation

## 2013-12-13 DIAGNOSIS — G459 Transient cerebral ischemic attack, unspecified: Secondary | ICD-10-CM | POA: Insufficient documentation

## 2013-12-17 ENCOUNTER — Encounter (HOSPITAL_COMMUNITY)
Admission: RE | Admit: 2013-12-17 | Discharge: 2013-12-17 | Disposition: A | Discharge status: Home / Self Care | Payer: Self-pay | Source: Ambulatory Visit | Attending: Cardiology | Admitting: Cardiology

## 2013-12-18 ENCOUNTER — Encounter (HOSPITAL_COMMUNITY)
Admission: RE | Admit: 2013-12-18 | Discharge: 2013-12-18 | Disposition: A | Discharge status: Home / Self Care | Payer: Self-pay | Source: Ambulatory Visit | Attending: Cardiology | Admitting: Cardiology

## 2013-12-19 ENCOUNTER — Encounter (HOSPITAL_COMMUNITY): Payer: Self-pay

## 2013-12-20 ENCOUNTER — Encounter (HOSPITAL_COMMUNITY)
Admission: RE | Admit: 2013-12-20 | Discharge: 2013-12-20 | Disposition: A | Discharge status: Home / Self Care | Payer: Self-pay | Source: Ambulatory Visit | Attending: Cardiology | Admitting: Cardiology

## 2013-12-23 ENCOUNTER — Other Ambulatory Visit: Payer: Self-pay | Admitting: Interventional Cardiology

## 2013-12-23 NOTE — Telephone Encounter (Signed)
Patient has seen Dr Radford Pax, but this has Dr Tamala Julian on it and the dose doesn't match. Please advise. Thanks, MI

## 2013-12-24 ENCOUNTER — Encounter (HOSPITAL_COMMUNITY)
Admission: RE | Admit: 2013-12-24 | Discharge: 2013-12-24 | Disposition: A | Discharge status: Home / Self Care | Payer: Self-pay | Source: Ambulatory Visit | Attending: Cardiology | Admitting: Cardiology

## 2013-12-25 ENCOUNTER — Encounter (HOSPITAL_COMMUNITY)
Admission: RE | Admit: 2013-12-25 | Discharge: 2013-12-25 | Disposition: A | Discharge status: Home / Self Care | Payer: Self-pay | Source: Ambulatory Visit | Attending: Cardiology | Admitting: Cardiology

## 2013-12-26 ENCOUNTER — Encounter (HOSPITAL_COMMUNITY): Payer: Self-pay

## 2013-12-27 ENCOUNTER — Encounter (HOSPITAL_COMMUNITY)
Admission: RE | Admit: 2013-12-27 | Discharge: 2013-12-27 | Disposition: A | Discharge status: Home / Self Care | Payer: Self-pay | Source: Ambulatory Visit | Attending: Cardiology | Admitting: Cardiology

## 2013-12-31 ENCOUNTER — Encounter (HOSPITAL_COMMUNITY)
Admission: RE | Admit: 2013-12-31 | Discharge: 2013-12-31 | Disposition: A | Discharge status: Home / Self Care | Payer: Self-pay | Source: Ambulatory Visit | Attending: Cardiology | Admitting: Cardiology

## 2014-01-01 ENCOUNTER — Encounter (HOSPITAL_COMMUNITY)
Admission: RE | Admit: 2014-01-01 | Discharge: 2014-01-01 | Disposition: A | Discharge status: Home / Self Care | Payer: Self-pay | Source: Ambulatory Visit | Attending: Cardiology | Admitting: Cardiology

## 2014-01-02 ENCOUNTER — Encounter (HOSPITAL_COMMUNITY): Payer: Self-pay

## 2014-01-03 ENCOUNTER — Encounter (HOSPITAL_COMMUNITY)
Admission: RE | Admit: 2014-01-03 | Discharge: 2014-01-03 | Disposition: A | Discharge status: Home / Self Care | Payer: Self-pay | Source: Ambulatory Visit | Attending: Cardiology | Admitting: Cardiology

## 2014-01-07 ENCOUNTER — Encounter (HOSPITAL_COMMUNITY)
Admission: RE | Admit: 2014-01-07 | Discharge: 2014-01-07 | Disposition: A | Discharge status: Home / Self Care | Payer: Self-pay | Source: Ambulatory Visit | Attending: Cardiology | Admitting: Cardiology

## 2014-01-08 ENCOUNTER — Encounter (HOSPITAL_COMMUNITY): Payer: Self-pay

## 2014-01-09 ENCOUNTER — Encounter (HOSPITAL_COMMUNITY)
Admission: RE | Admit: 2014-01-09 | Discharge: 2014-01-09 | Disposition: A | Discharge status: Home / Self Care | Payer: Self-pay | Source: Ambulatory Visit | Attending: Cardiology | Admitting: Cardiology

## 2014-01-10 ENCOUNTER — Encounter (HOSPITAL_COMMUNITY)
Admission: RE | Admit: 2014-01-10 | Discharge: 2014-01-10 | Disposition: A | Discharge status: Home / Self Care | Payer: Self-pay | Source: Ambulatory Visit | Attending: Cardiology | Admitting: Cardiology

## 2014-01-14 ENCOUNTER — Encounter (HOSPITAL_COMMUNITY)
Admission: RE | Admit: 2014-01-14 | Discharge: 2014-01-14 | Disposition: A | Discharge status: Home / Self Care | Payer: Self-pay | Source: Ambulatory Visit | Attending: Cardiology | Admitting: Cardiology

## 2014-01-14 DIAGNOSIS — Z951 Presence of aortocoronary bypass graft: Secondary | ICD-10-CM | POA: Insufficient documentation

## 2014-01-14 DIAGNOSIS — I2510 Atherosclerotic heart disease of native coronary artery without angina pectoris: Secondary | ICD-10-CM | POA: Insufficient documentation

## 2014-01-14 DIAGNOSIS — Z5189 Encounter for other specified aftercare: Secondary | ICD-10-CM | POA: Insufficient documentation

## 2014-01-15 ENCOUNTER — Encounter (HOSPITAL_COMMUNITY)
Admission: RE | Admit: 2014-01-15 | Discharge: 2014-01-15 | Disposition: A | Discharge status: Home / Self Care | Payer: Self-pay | Source: Ambulatory Visit | Attending: Cardiology | Admitting: Cardiology

## 2014-01-16 ENCOUNTER — Encounter (HOSPITAL_COMMUNITY): Payer: Self-pay

## 2014-01-17 ENCOUNTER — Encounter (HOSPITAL_COMMUNITY)
Admission: RE | Admit: 2014-01-17 | Discharge: 2014-01-17 | Disposition: A | Discharge status: Home / Self Care | Payer: Self-pay | Source: Ambulatory Visit | Attending: Cardiology | Admitting: Cardiology

## 2014-01-21 ENCOUNTER — Encounter (HOSPITAL_COMMUNITY): Payer: Self-pay

## 2014-01-22 ENCOUNTER — Encounter (HOSPITAL_COMMUNITY): Payer: Self-pay

## 2014-01-23 ENCOUNTER — Encounter (HOSPITAL_COMMUNITY)
Admission: RE | Admit: 2014-01-23 | Discharge: 2014-01-23 | Disposition: A | Discharge status: Home / Self Care | Payer: Self-pay | Source: Ambulatory Visit | Attending: Cardiology | Admitting: Cardiology

## 2014-01-24 ENCOUNTER — Encounter (HOSPITAL_COMMUNITY)
Admission: RE | Admit: 2014-01-24 | Discharge: 2014-01-24 | Disposition: A | Discharge status: Home / Self Care | Payer: Self-pay | Source: Ambulatory Visit | Attending: Cardiology | Admitting: Cardiology

## 2014-01-28 ENCOUNTER — Encounter (HOSPITAL_COMMUNITY)
Admission: RE | Admit: 2014-01-28 | Discharge: 2014-01-28 | Disposition: A | Discharge status: Home / Self Care | Payer: Self-pay | Source: Ambulatory Visit | Attending: Cardiology | Admitting: Cardiology

## 2014-01-29 ENCOUNTER — Encounter (HOSPITAL_COMMUNITY)
Admission: RE | Admit: 2014-01-29 | Discharge: 2014-01-29 | Disposition: A | Discharge status: Home / Self Care | Payer: Self-pay | Source: Ambulatory Visit | Attending: Cardiology | Admitting: Cardiology

## 2014-01-30 ENCOUNTER — Encounter (HOSPITAL_COMMUNITY): Payer: Self-pay

## 2014-01-31 ENCOUNTER — Encounter (HOSPITAL_COMMUNITY)
Admission: RE | Admit: 2014-01-31 | Discharge: 2014-01-31 | Disposition: A | Discharge status: Home / Self Care | Payer: Self-pay | Source: Ambulatory Visit | Attending: Cardiology | Admitting: Cardiology

## 2014-02-04 ENCOUNTER — Encounter (HOSPITAL_COMMUNITY)
Admission: RE | Admit: 2014-02-04 | Discharge: 2014-02-04 | Disposition: A | Discharge status: Home / Self Care | Payer: Self-pay | Source: Ambulatory Visit | Attending: Cardiology | Admitting: Cardiology

## 2014-02-05 ENCOUNTER — Encounter (HOSPITAL_COMMUNITY)
Admission: RE | Admit: 2014-02-05 | Discharge: 2014-02-05 | Disposition: A | Discharge status: Home / Self Care | Payer: Self-pay | Source: Ambulatory Visit | Attending: Cardiology | Admitting: Cardiology

## 2014-02-06 ENCOUNTER — Encounter (HOSPITAL_COMMUNITY): Payer: Self-pay

## 2014-02-07 ENCOUNTER — Encounter (HOSPITAL_COMMUNITY)
Admission: RE | Admit: 2014-02-07 | Discharge: 2014-02-07 | Disposition: A | Discharge status: Home / Self Care | Payer: Self-pay | Source: Ambulatory Visit | Attending: Cardiology | Admitting: Cardiology

## 2014-02-11 ENCOUNTER — Encounter (HOSPITAL_COMMUNITY)
Admission: RE | Admit: 2014-02-11 | Discharge: 2014-02-11 | Disposition: A | Discharge status: Home / Self Care | Payer: Self-pay | Source: Ambulatory Visit | Attending: Cardiology | Admitting: Cardiology

## 2014-02-11 ENCOUNTER — Other Ambulatory Visit (INDEPENDENT_AMBULATORY_CARE_PROVIDER_SITE_OTHER): Payer: 59

## 2014-02-11 DIAGNOSIS — E785 Hyperlipidemia, unspecified: Secondary | ICD-10-CM

## 2014-02-11 LAB — ALT: ALT: 35 U/L (ref 0–53)

## 2014-02-11 LAB — LIPID PANEL
CHOLESTEROL: 104 mg/dL (ref 0–200)
HDL: 35.3 mg/dL — ABNORMAL LOW (ref >39.00)
LDL Cholesterol: 55 mg/dL (ref 0–99)
NONHDL: 68.7
Total CHOL/HDL Ratio: 3
Triglycerides: 71 mg/dL (ref 0.0–149.0)
VLDL: 14.2 mg/dL (ref 0.0–40.0)

## 2014-02-12 ENCOUNTER — Encounter (HOSPITAL_COMMUNITY)
Admission: RE | Admit: 2014-02-12 | Discharge: 2014-02-12 | Disposition: A | Discharge status: Home / Self Care | Payer: Self-pay | Source: Ambulatory Visit | Attending: Cardiology | Admitting: Cardiology

## 2014-02-12 DIAGNOSIS — I2510 Atherosclerotic heart disease of native coronary artery without angina pectoris: Secondary | ICD-10-CM | POA: Insufficient documentation

## 2014-02-12 DIAGNOSIS — Z5189 Encounter for other specified aftercare: Secondary | ICD-10-CM | POA: Insufficient documentation

## 2014-02-12 DIAGNOSIS — Z951 Presence of aortocoronary bypass graft: Secondary | ICD-10-CM | POA: Insufficient documentation

## 2014-02-13 ENCOUNTER — Encounter: Payer: Self-pay | Admitting: General Surgery

## 2014-02-13 ENCOUNTER — Encounter (HOSPITAL_COMMUNITY)
Admission: RE | Admit: 2014-02-13 | Discharge: 2014-02-13 | Disposition: A | Discharge status: Home / Self Care | Payer: Self-pay | Source: Ambulatory Visit | Attending: Cardiology | Admitting: Cardiology

## 2014-02-18 ENCOUNTER — Encounter (HOSPITAL_COMMUNITY): Payer: Self-pay

## 2014-02-19 ENCOUNTER — Encounter (HOSPITAL_COMMUNITY)
Admission: RE | Admit: 2014-02-19 | Discharge: 2014-02-19 | Disposition: A | Discharge status: Home / Self Care | Payer: Self-pay | Source: Ambulatory Visit | Attending: Cardiology | Admitting: Cardiology

## 2014-02-20 ENCOUNTER — Encounter (HOSPITAL_COMMUNITY)
Admission: RE | Admit: 2014-02-20 | Discharge: 2014-02-20 | Disposition: A | Discharge status: Home / Self Care | Payer: Self-pay | Source: Ambulatory Visit | Attending: Cardiology | Admitting: Cardiology

## 2014-02-21 ENCOUNTER — Encounter (HOSPITAL_COMMUNITY)
Admission: RE | Admit: 2014-02-21 | Discharge: 2014-02-21 | Disposition: A | Discharge status: Home / Self Care | Payer: Self-pay | Source: Ambulatory Visit | Attending: Cardiology | Admitting: Cardiology

## 2014-02-25 ENCOUNTER — Encounter (HOSPITAL_COMMUNITY)
Admission: RE | Admit: 2014-02-25 | Discharge: 2014-02-25 | Disposition: A | Discharge status: Home / Self Care | Payer: Self-pay | Source: Ambulatory Visit | Attending: Cardiology | Admitting: Cardiology

## 2014-02-26 ENCOUNTER — Encounter (HOSPITAL_COMMUNITY): Payer: Self-pay

## 2014-02-27 ENCOUNTER — Encounter (HOSPITAL_COMMUNITY)
Admission: RE | Admit: 2014-02-27 | Discharge: 2014-02-27 | Disposition: A | Discharge status: Home / Self Care | Payer: Self-pay | Source: Ambulatory Visit | Attending: Cardiology | Admitting: Cardiology

## 2014-02-28 ENCOUNTER — Encounter (HOSPITAL_COMMUNITY)
Admission: RE | Admit: 2014-02-28 | Discharge: 2014-02-28 | Disposition: A | Discharge status: Home / Self Care | Payer: Self-pay | Source: Ambulatory Visit | Attending: Cardiology | Admitting: Cardiology

## 2014-03-04 ENCOUNTER — Encounter (HOSPITAL_COMMUNITY)
Admission: RE | Admit: 2014-03-04 | Discharge: 2014-03-04 | Disposition: A | Discharge status: Home / Self Care | Payer: Self-pay | Source: Ambulatory Visit | Attending: Cardiology | Admitting: Cardiology

## 2014-03-05 ENCOUNTER — Encounter (HOSPITAL_COMMUNITY)
Admission: RE | Admit: 2014-03-05 | Discharge: 2014-03-05 | Disposition: A | Discharge status: Home / Self Care | Payer: Self-pay | Source: Ambulatory Visit | Attending: Cardiology | Admitting: Cardiology

## 2014-03-06 ENCOUNTER — Encounter (HOSPITAL_COMMUNITY): Payer: Self-pay

## 2014-03-07 ENCOUNTER — Encounter (HOSPITAL_COMMUNITY)
Admission: RE | Admit: 2014-03-07 | Discharge: 2014-03-07 | Disposition: A | Discharge status: Home / Self Care | Payer: Self-pay | Source: Ambulatory Visit | Attending: Cardiology | Admitting: Cardiology

## 2014-03-11 ENCOUNTER — Encounter (HOSPITAL_COMMUNITY)
Admission: RE | Admit: 2014-03-11 | Discharge: 2014-03-11 | Disposition: A | Discharge status: Home / Self Care | Payer: Self-pay | Source: Ambulatory Visit | Attending: Cardiology | Admitting: Cardiology

## 2014-03-12 ENCOUNTER — Encounter (HOSPITAL_COMMUNITY)
Admission: RE | Admit: 2014-03-12 | Discharge: 2014-03-12 | Disposition: A | Discharge status: Home / Self Care | Payer: Self-pay | Source: Ambulatory Visit | Attending: Cardiology | Admitting: Cardiology

## 2014-03-13 ENCOUNTER — Encounter (HOSPITAL_COMMUNITY): Payer: Self-pay

## 2014-03-14 ENCOUNTER — Encounter (HOSPITAL_COMMUNITY)
Admission: RE | Admit: 2014-03-14 | Discharge: 2014-03-14 | Disposition: A | Discharge status: Home / Self Care | Payer: Self-pay | Source: Ambulatory Visit | Attending: Cardiology | Admitting: Cardiology

## 2014-03-18 ENCOUNTER — Encounter (HOSPITAL_COMMUNITY)
Admission: RE | Admit: 2014-03-18 | Discharge: 2014-03-18 | Disposition: A | Discharge status: Home / Self Care | Payer: Self-pay | Source: Ambulatory Visit | Attending: Cardiology | Admitting: Cardiology

## 2014-03-18 DIAGNOSIS — Z5189 Encounter for other specified aftercare: Secondary | ICD-10-CM | POA: Insufficient documentation

## 2014-03-18 DIAGNOSIS — Z951 Presence of aortocoronary bypass graft: Secondary | ICD-10-CM | POA: Insufficient documentation

## 2014-03-18 DIAGNOSIS — I2510 Atherosclerotic heart disease of native coronary artery without angina pectoris: Secondary | ICD-10-CM | POA: Insufficient documentation

## 2014-03-19 ENCOUNTER — Encounter (HOSPITAL_COMMUNITY)
Admission: RE | Admit: 2014-03-19 | Discharge: 2014-03-19 | Disposition: A | Discharge status: Home / Self Care | Payer: Self-pay | Source: Ambulatory Visit | Attending: Cardiology | Admitting: Cardiology

## 2014-03-20 ENCOUNTER — Encounter (HOSPITAL_COMMUNITY): Payer: Self-pay

## 2014-03-21 ENCOUNTER — Encounter (HOSPITAL_COMMUNITY)
Admission: RE | Admit: 2014-03-21 | Discharge: 2014-03-21 | Disposition: A | Discharge status: Home / Self Care | Payer: Self-pay | Source: Ambulatory Visit | Attending: Cardiology | Admitting: Cardiology

## 2014-03-25 ENCOUNTER — Encounter (HOSPITAL_COMMUNITY)
Admission: RE | Admit: 2014-03-25 | Discharge: 2014-03-25 | Disposition: A | Discharge status: Home / Self Care | Payer: Self-pay | Source: Ambulatory Visit | Attending: Cardiology | Admitting: Cardiology

## 2014-03-26 ENCOUNTER — Encounter (HOSPITAL_COMMUNITY)
Admission: RE | Admit: 2014-03-26 | Discharge: 2014-03-26 | Disposition: A | Discharge status: Home / Self Care | Payer: Self-pay | Source: Ambulatory Visit | Attending: Cardiology | Admitting: Cardiology

## 2014-03-27 ENCOUNTER — Encounter (HOSPITAL_COMMUNITY): Payer: Self-pay

## 2014-03-28 ENCOUNTER — Encounter (HOSPITAL_COMMUNITY)
Admission: RE | Admit: 2014-03-28 | Discharge: 2014-03-28 | Disposition: A | Discharge status: Home / Self Care | Payer: Self-pay | Source: Ambulatory Visit | Attending: Cardiology | Admitting: Cardiology

## 2014-04-01 ENCOUNTER — Encounter (HOSPITAL_COMMUNITY)
Admission: RE | Admit: 2014-04-01 | Discharge: 2014-04-01 | Disposition: A | Discharge status: Home / Self Care | Payer: Self-pay | Source: Ambulatory Visit | Attending: Cardiology | Admitting: Cardiology

## 2014-04-02 ENCOUNTER — Encounter (HOSPITAL_COMMUNITY)
Admission: RE | Admit: 2014-04-02 | Discharge: 2014-04-02 | Disposition: A | Discharge status: Home / Self Care | Payer: Self-pay | Source: Ambulatory Visit | Attending: Cardiology | Admitting: Cardiology

## 2014-04-03 ENCOUNTER — Encounter (HOSPITAL_COMMUNITY): Payer: Self-pay

## 2014-04-08 ENCOUNTER — Encounter (HOSPITAL_COMMUNITY)
Admission: RE | Admit: 2014-04-08 | Discharge: 2014-04-08 | Disposition: A | Discharge status: Home / Self Care | Payer: Self-pay | Source: Ambulatory Visit | Attending: Cardiology | Admitting: Cardiology

## 2014-04-09 ENCOUNTER — Encounter (HOSPITAL_COMMUNITY)
Admission: RE | Admit: 2014-04-09 | Discharge: 2014-04-09 | Disposition: A | Discharge status: Home / Self Care | Payer: Self-pay | Source: Ambulatory Visit | Attending: Cardiology | Admitting: Cardiology

## 2014-04-10 ENCOUNTER — Encounter (HOSPITAL_COMMUNITY): Payer: Self-pay

## 2014-04-11 ENCOUNTER — Encounter (HOSPITAL_COMMUNITY)
Admission: RE | Admit: 2014-04-11 | Discharge: 2014-04-11 | Disposition: A | Discharge status: Home / Self Care | Payer: Self-pay | Source: Ambulatory Visit | Attending: Cardiology | Admitting: Cardiology

## 2014-04-15 ENCOUNTER — Encounter (HOSPITAL_COMMUNITY): Payer: Self-pay

## 2014-04-15 DIAGNOSIS — I2510 Atherosclerotic heart disease of native coronary artery without angina pectoris: Secondary | ICD-10-CM | POA: Insufficient documentation

## 2014-04-15 DIAGNOSIS — Z951 Presence of aortocoronary bypass graft: Secondary | ICD-10-CM | POA: Insufficient documentation

## 2014-04-15 DIAGNOSIS — Z5189 Encounter for other specified aftercare: Secondary | ICD-10-CM | POA: Insufficient documentation

## 2014-04-16 ENCOUNTER — Encounter (HOSPITAL_COMMUNITY)
Admission: RE | Admit: 2014-04-16 | Discharge: 2014-04-16 | Disposition: A | Discharge status: Home / Self Care | Payer: Self-pay | Source: Ambulatory Visit | Attending: Cardiology | Admitting: Cardiology

## 2014-04-17 ENCOUNTER — Encounter (HOSPITAL_COMMUNITY)
Admission: RE | Admit: 2014-04-17 | Discharge: 2014-04-17 | Disposition: A | Discharge status: Home / Self Care | Payer: Self-pay | Source: Ambulatory Visit | Attending: Cardiology | Admitting: Cardiology

## 2014-04-17 ENCOUNTER — Other Ambulatory Visit: Payer: Self-pay | Admitting: Cardiology

## 2014-04-18 ENCOUNTER — Encounter (HOSPITAL_COMMUNITY)
Admission: RE | Admit: 2014-04-18 | Discharge: 2014-04-18 | Disposition: A | Discharge status: Home / Self Care | Payer: Self-pay | Source: Ambulatory Visit | Attending: Cardiology | Admitting: Cardiology

## 2014-04-18 ENCOUNTER — Other Ambulatory Visit: Payer: Self-pay

## 2014-04-18 MED ORDER — METOPROLOL SUCCINATE ER 50 MG PO TB24: 50.0000 mg | ORAL_TABLET | Freq: Every day | ORAL | Status: DC

## 2014-04-18 NOTE — Telephone Encounter (Signed)
OK to refill but make sure pt knows they need a f.u with Dr Radford Pax. Give 1 refill

## 2014-04-22 ENCOUNTER — Encounter (HOSPITAL_COMMUNITY)
Admission: RE | Admit: 2014-04-22 | Discharge: 2014-04-22 | Disposition: A | Discharge status: Home / Self Care | Payer: Self-pay | Source: Ambulatory Visit | Attending: Cardiology | Admitting: Cardiology

## 2014-04-23 ENCOUNTER — Encounter (HOSPITAL_COMMUNITY)
Admission: RE | Admit: 2014-04-23 | Discharge: 2014-04-23 | Disposition: A | Discharge status: Home / Self Care | Payer: Self-pay | Source: Ambulatory Visit | Attending: Cardiology | Admitting: Cardiology

## 2014-04-24 ENCOUNTER — Encounter (HOSPITAL_COMMUNITY): Payer: Self-pay

## 2014-04-29 ENCOUNTER — Encounter (HOSPITAL_COMMUNITY): Payer: Self-pay

## 2014-04-30 ENCOUNTER — Encounter (HOSPITAL_COMMUNITY): Payer: Self-pay

## 2014-05-01 ENCOUNTER — Encounter (HOSPITAL_COMMUNITY): Payer: Self-pay

## 2014-05-02 ENCOUNTER — Encounter (HOSPITAL_COMMUNITY)
Admission: RE | Admit: 2014-05-02 | Discharge: 2014-05-02 | Disposition: A | Discharge status: Home / Self Care | Payer: Self-pay | Source: Ambulatory Visit | Attending: Cardiology | Admitting: Cardiology

## 2014-05-06 ENCOUNTER — Encounter (HOSPITAL_COMMUNITY)
Admission: RE | Admit: 2014-05-06 | Discharge: 2014-05-06 | Disposition: A | Discharge status: Home / Self Care | Payer: Self-pay | Source: Ambulatory Visit | Attending: Cardiology | Admitting: Cardiology

## 2014-05-07 ENCOUNTER — Encounter (HOSPITAL_COMMUNITY)
Admission: RE | Admit: 2014-05-07 | Discharge: 2014-05-07 | Disposition: A | Discharge status: Home / Self Care | Payer: Self-pay | Source: Ambulatory Visit | Attending: Cardiology | Admitting: Cardiology

## 2014-05-08 ENCOUNTER — Other Ambulatory Visit: Payer: Self-pay | Admitting: Internal Medicine

## 2014-05-08 ENCOUNTER — Encounter (HOSPITAL_COMMUNITY): Payer: Self-pay

## 2014-05-09 ENCOUNTER — Encounter (HOSPITAL_COMMUNITY)
Admission: RE | Admit: 2014-05-09 | Discharge: 2014-05-09 | Disposition: A | Discharge status: Home / Self Care | Payer: Self-pay | Source: Ambulatory Visit | Attending: Cardiology | Admitting: Cardiology

## 2014-05-13 ENCOUNTER — Encounter (HOSPITAL_COMMUNITY)
Admission: RE | Admit: 2014-05-13 | Discharge: 2014-05-13 | Disposition: A | Discharge status: Home / Self Care | Payer: Self-pay | Source: Ambulatory Visit | Attending: Cardiology | Admitting: Cardiology

## 2014-05-14 ENCOUNTER — Encounter (HOSPITAL_COMMUNITY)
Admission: RE | Admit: 2014-05-14 | Discharge: 2014-05-14 | Disposition: A | Discharge status: Home / Self Care | Payer: Self-pay | Source: Ambulatory Visit | Attending: Cardiology | Admitting: Cardiology

## 2014-05-15 ENCOUNTER — Encounter (HOSPITAL_COMMUNITY): Payer: Self-pay

## 2014-05-15 DIAGNOSIS — E780 Pure hypercholesterolemia: Secondary | ICD-10-CM | POA: Insufficient documentation

## 2014-05-15 DIAGNOSIS — I2510 Atherosclerotic heart disease of native coronary artery without angina pectoris: Secondary | ICD-10-CM | POA: Insufficient documentation

## 2014-05-15 DIAGNOSIS — Z8673 Personal history of transient ischemic attack (TIA), and cerebral infarction without residual deficits: Secondary | ICD-10-CM | POA: Insufficient documentation

## 2014-05-15 DIAGNOSIS — I129 Hypertensive chronic kidney disease with stage 1 through stage 4 chronic kidney disease, or unspecified chronic kidney disease: Secondary | ICD-10-CM | POA: Insufficient documentation

## 2014-05-15 DIAGNOSIS — E785 Hyperlipidemia, unspecified: Secondary | ICD-10-CM | POA: Insufficient documentation

## 2014-05-15 DIAGNOSIS — Z794 Long term (current) use of insulin: Secondary | ICD-10-CM | POA: Insufficient documentation

## 2014-05-15 DIAGNOSIS — E119 Type 2 diabetes mellitus without complications: Secondary | ICD-10-CM | POA: Insufficient documentation

## 2014-05-15 DIAGNOSIS — I252 Old myocardial infarction: Secondary | ICD-10-CM | POA: Insufficient documentation

## 2014-05-15 DIAGNOSIS — N189 Chronic kidney disease, unspecified: Secondary | ICD-10-CM | POA: Insufficient documentation

## 2014-05-16 ENCOUNTER — Encounter (HOSPITAL_COMMUNITY)
Admission: RE | Admit: 2014-05-16 | Discharge: 2014-05-16 | Disposition: A | Discharge status: Home / Self Care | Payer: Self-pay | Source: Ambulatory Visit | Attending: Cardiology | Admitting: Cardiology

## 2014-05-20 ENCOUNTER — Encounter (HOSPITAL_COMMUNITY)
Admission: RE | Admit: 2014-05-20 | Discharge: 2014-05-20 | Disposition: A | Discharge status: Home / Self Care | Payer: Self-pay | Source: Ambulatory Visit | Attending: Cardiology | Admitting: Cardiology

## 2014-05-21 ENCOUNTER — Encounter (HOSPITAL_COMMUNITY)
Admission: RE | Admit: 2014-05-21 | Discharge: 2014-05-21 | Disposition: A | Discharge status: Home / Self Care | Payer: Self-pay | Source: Ambulatory Visit | Attending: Cardiology | Admitting: Cardiology

## 2014-05-22 ENCOUNTER — Encounter (HOSPITAL_COMMUNITY): Payer: Self-pay

## 2014-05-23 ENCOUNTER — Encounter (HOSPITAL_COMMUNITY)
Admission: RE | Admit: 2014-05-23 | Discharge: 2014-05-23 | Disposition: A | Discharge status: Home / Self Care | Payer: Self-pay | Source: Ambulatory Visit | Attending: Cardiology | Admitting: Cardiology

## 2014-05-27 ENCOUNTER — Encounter (HOSPITAL_COMMUNITY)
Admission: RE | Admit: 2014-05-27 | Discharge: 2014-05-27 | Disposition: A | Discharge status: Home / Self Care | Payer: Self-pay | Source: Ambulatory Visit | Attending: Cardiology | Admitting: Cardiology

## 2014-05-28 ENCOUNTER — Encounter (HOSPITAL_COMMUNITY)
Admission: RE | Admit: 2014-05-28 | Discharge: 2014-05-28 | Disposition: A | Discharge status: Home / Self Care | Payer: Self-pay | Source: Ambulatory Visit | Attending: Cardiology | Admitting: Cardiology

## 2014-05-29 ENCOUNTER — Encounter (HOSPITAL_COMMUNITY)
Admission: RE | Admit: 2014-05-29 | Discharge: 2014-05-29 | Disposition: A | Discharge status: Home / Self Care | Payer: Self-pay | Source: Ambulatory Visit | Attending: Cardiology | Admitting: Cardiology

## 2014-05-30 ENCOUNTER — Encounter (HOSPITAL_COMMUNITY)
Admission: RE | Admit: 2014-05-30 | Discharge: 2014-05-30 | Disposition: A | Discharge status: Home / Self Care | Payer: Self-pay | Source: Ambulatory Visit | Attending: Cardiology | Admitting: Cardiology

## 2014-06-03 ENCOUNTER — Encounter (HOSPITAL_COMMUNITY)
Admission: RE | Admit: 2014-06-03 | Discharge: 2014-06-03 | Disposition: A | Discharge status: Home / Self Care | Payer: Self-pay | Source: Ambulatory Visit | Attending: Cardiology | Admitting: Cardiology

## 2014-06-04 ENCOUNTER — Encounter (HOSPITAL_COMMUNITY)
Admission: RE | Admit: 2014-06-04 | Discharge: 2014-06-04 | Disposition: A | Discharge status: Home / Self Care | Payer: Self-pay | Source: Ambulatory Visit | Attending: Cardiology | Admitting: Cardiology

## 2014-06-05 ENCOUNTER — Encounter (HOSPITAL_COMMUNITY): Payer: Self-pay

## 2014-06-06 ENCOUNTER — Encounter (HOSPITAL_COMMUNITY)
Admission: RE | Admit: 2014-06-06 | Discharge: 2014-06-06 | Disposition: A | Discharge status: Home / Self Care | Payer: Self-pay | Source: Ambulatory Visit | Attending: Cardiology | Admitting: Cardiology

## 2014-06-10 ENCOUNTER — Encounter (HOSPITAL_COMMUNITY)
Admission: RE | Admit: 2014-06-10 | Discharge: 2014-06-10 | Disposition: A | Discharge status: Home / Self Care | Payer: Self-pay | Source: Ambulatory Visit | Attending: Cardiology | Admitting: Cardiology

## 2014-06-11 ENCOUNTER — Encounter (HOSPITAL_COMMUNITY): Payer: Self-pay

## 2014-06-11 ENCOUNTER — Other Ambulatory Visit: Payer: Self-pay | Admitting: Internal Medicine

## 2014-06-12 ENCOUNTER — Encounter (HOSPITAL_COMMUNITY)
Admission: RE | Admit: 2014-06-12 | Discharge: 2014-06-12 | Disposition: A | Discharge status: Home / Self Care | Payer: Self-pay | Source: Ambulatory Visit | Attending: Cardiology | Admitting: Cardiology

## 2014-06-13 ENCOUNTER — Other Ambulatory Visit: Payer: Self-pay | Admitting: Cardiology

## 2014-06-13 ENCOUNTER — Encounter (HOSPITAL_COMMUNITY)
Admission: RE | Admit: 2014-06-13 | Discharge: 2014-06-13 | Disposition: A | Discharge status: Home / Self Care | Payer: Self-pay | Source: Ambulatory Visit | Attending: Cardiology | Admitting: Cardiology

## 2014-06-17 ENCOUNTER — Encounter (HOSPITAL_COMMUNITY)
Admission: RE | Admit: 2014-06-17 | Discharge: 2014-06-17 | Disposition: A | Discharge status: Home / Self Care | Payer: Self-pay | Source: Ambulatory Visit | Attending: Cardiology | Admitting: Cardiology

## 2014-06-17 ENCOUNTER — Ambulatory Visit (INDEPENDENT_AMBULATORY_CARE_PROVIDER_SITE_OTHER): Payer: 59 | Admitting: Cardiology

## 2014-06-17 ENCOUNTER — Encounter: Payer: Self-pay | Admitting: Cardiology

## 2014-06-17 VITALS — BP 122/82 | HR 109 | Ht 71.0 in | Wt 191.6 lb

## 2014-06-17 DIAGNOSIS — I2510 Atherosclerotic heart disease of native coronary artery without angina pectoris: Secondary | ICD-10-CM | POA: Insufficient documentation

## 2014-06-17 DIAGNOSIS — I10 Essential (primary) hypertension: Secondary | ICD-10-CM

## 2014-06-17 DIAGNOSIS — Z5189 Encounter for other specified aftercare: Secondary | ICD-10-CM | POA: Insufficient documentation

## 2014-06-17 DIAGNOSIS — Z951 Presence of aortocoronary bypass graft: Secondary | ICD-10-CM | POA: Insufficient documentation

## 2014-06-17 DIAGNOSIS — I2584 Coronary atherosclerosis due to calcified coronary lesion: Secondary | ICD-10-CM

## 2014-06-17 DIAGNOSIS — E785 Hyperlipidemia, unspecified: Secondary | ICD-10-CM

## 2014-06-17 DIAGNOSIS — R000 Tachycardia, unspecified: Secondary | ICD-10-CM

## 2014-06-17 DIAGNOSIS — I471 Supraventricular tachycardia: Secondary | ICD-10-CM

## 2014-06-17 MED ORDER — METOPROLOL SUCCINATE ER 25 MG PO TB24: 25.0000 mg | ORAL_TABLET | Freq: Every day | ORAL | Status: DC

## 2014-06-17 MED ORDER — ASPIRIN 81 MG PO TBEC: 81.0000 mg | DELAYED_RELEASE_TABLET | Freq: Every day | ORAL | Status: DC

## 2014-06-17 NOTE — Progress Notes (Signed)
[place], [place], [place]  [address] Phone: [phone] Fax:  [phone]  Date:  06/17/2014   ID:  [name], DOB June 30, 1951, MRN [id number]  PCP:  [name], MD  Cardiologist:  Fransico Him, MD    History of Present Illness: [name] is a 63 y.o. male with a history of CAD/HTN and lipids. He is doing well. He denies any anginal chest pain, SOB, DOE, LE edema, dizziness, palpitations or syncope.    Wt Readings from Last 3 Encounters:  06/17/14 191 lb 9.6 oz (86.909 kg)  06/24/13 185 lb (83.915 kg)  03/20/13 180 lb (81.647 kg)     Past Medical History  Diagnosis Date  . Erectile dysfunction   . Dyslipidemia   . Chronic kidney disease     kidney stones s/p lithotripsy  . Cancer     mediastinal seminoma resected 1985 - benign  . Chest pain   . Shortness of breath   . GERD (gastroesophageal reflux disease)   . NSTEMI (non-ST elevated myocardial infarction)   . Insomnia   . TIA (transient ischemic attack)   . AAA (abdominal aortic aneurysm)     (-)11/11,CT abdomen-no aneurysm aortic  . Diabetes mellitus     neuropathy  insulin dependent  . Insomnia   . Erectile dysfunction   . Hypertension   . Elevated cholesterol   . Stroke     TIA's  . Coronary artery disease 02/2012    60% mid RCA, 80% prox left circ, 99% distal left circ, normal LAD s/p PCI left circ/OM, 02/2012 -now s/p CABG    Current Outpatient Prescriptions  Medication Sig Dispense Refill  . amitriptyline (ELAVIL) 100 MG tablet Take 100 mg by mouth at bedtime.    Marland Kitchen aspirin EC 325 MG EC tablet Take 1 tablet (325 mg total) by mouth daily. 30 tablet 0  . B-D ULTRAFINE III SHORT PEN 31G X 8 MM MISC     . insulin aspart (NOVOLOG) 100 UNIT/ML injection Inject 6-14 Units into the skin 3 (three) times daily before meals. Patient's dose is done on a sliding scale. Usually takes 8 units, but dose varies depending on glucose level.    . insulin glargine (LANTUS) 100 UNIT/ML injection  Inject 40 Units into the skin at bedtime.    . iron polysaccharides (NIFEREX) 150 MG capsule Take 300 mg by mouth daily.     . metFORMIN (GLUCOPHAGE) 500 MG tablet Take 1,000 mg by mouth 2 (two) times daily with a meal.    . metoprolol succinate (TOPROL-XL) 50 MG 24 hr tablet TAKE 1 TABLET BY MOUTH DAILY 15 tablet 0  . Multiple Vitamin (MULTIVITAMIN) tablet Take 1 tablet by mouth daily.    . Omega-3 Fatty Acids (FISH OIL MAXIMUM STRENGTH) 1200 MG CAPS Take 3,000 mg by mouth 2 (two) times daily.     . ONE TOUCH ULTRA TEST test strip     . simvastatin (ZOCOR) 40 MG tablet Take 40 mg by mouth every evening.    . zolpidem (AMBIEN) 10 MG tablet Take 10 mg by mouth at bedtime as needed for sleep.      No current facility-administered medications for this visit.    Allergies:    Allergies  Allergen Reactions  . Lipitor [Atorvastatin] Other (See Comments)    myalgias  . Adhesive [Tape] Rash    Social History:  The patient  reports that he quit smoking about 29 years ago. He has never used smokeless  tobacco. He reports that he does not drink alcohol or use illicit drugs.   Family History:  The patient's family history includes Cancer in his brother; Diabetes in his father, mother, and sister; Heart failure in his mother; Hypertension in his mother.   ROS:  Please see the history of present illness.      All other systems reviewed and negative.   PHYSICAL EXAM: VS:  BP 122/82 mmHg  Pulse 109  Ht 5\' 11"  (1.803 m)  Wt 191 lb 9.6 oz (86.909 kg)  BMI 26.73 kg/m2 Well nourished, well developed, in no acute distress HEENT: normal Neck: no JVD Cardiac:  normal S1, S2; RRR; no murmur Lungs:  clear to auscultation bilaterally, no wheezing, rhonchi or rales Abd: soft, nontender, no hepatomegaly Ext: no edema Skin: warm and dry Neuro:  CNs 2-12 intact, no focal abnormalities noted  JKK:[id number] tachycardia at 105bpm with no ST changes, LAE, LVH by voltage with early repol     ASSESSMENT AND  PLAN:  1. ASCAD s/p CABG with no angina - continue ASA and decrease to 81mg  daily 2. HTN - continue metoprolol 3. Dyslipidemia - his last lipids 01/2014 showed an LDL of 55 - continue simvastatin 4. Tachycardia - he just took his beta blocker a short while ago             - increase Toprol to 75mg  daily             - repeat EKG with nurse in 1 week with BP check  Followup with me in 6 months   Signed, Fransico Him, MD [facility] 06/17/2014 10:10 AM

## 2014-06-17 NOTE — Patient Instructions (Signed)
Your physician has recommended you make the following change in your medication:  1) DECREASE Aspirin to 81 mg daily 2) INCREASE Toprol to 75 mg at night   Your physician wants you to follow-up in: 6 months with Dr. Radford Pax. You will receive a reminder letter in the mail two months in advance. If you don't receive a letter, please call our office to schedule the follow-up appointment.  Come back on 11/12 at 9:00 am for an EKG and BP check.

## 2014-06-20 ENCOUNTER — Encounter (HOSPITAL_COMMUNITY)
Admission: RE | Admit: 2014-06-20 | Discharge: 2014-06-20 | Disposition: A | Discharge status: Home / Self Care | Payer: Self-pay | Source: Ambulatory Visit | Attending: Cardiology | Admitting: Cardiology

## 2014-06-24 ENCOUNTER — Encounter (HOSPITAL_COMMUNITY)
Admission: RE | Admit: 2014-06-24 | Discharge: 2014-06-24 | Disposition: A | Discharge status: Home / Self Care | Payer: Self-pay | Source: Ambulatory Visit | Attending: Cardiology | Admitting: Cardiology

## 2014-06-26 ENCOUNTER — Ambulatory Visit (INDEPENDENT_AMBULATORY_CARE_PROVIDER_SITE_OTHER): Payer: 59 | Admitting: *Deleted

## 2014-06-26 ENCOUNTER — Encounter (HOSPITAL_COMMUNITY)
Admission: RE | Admit: 2014-06-26 | Discharge: 2014-06-26 | Disposition: A | Discharge status: Home / Self Care | Payer: Self-pay | Source: Ambulatory Visit | Attending: Cardiology | Admitting: Cardiology

## 2014-06-26 VITALS — BP 128/70 | HR 105 | Wt 192.8 lb

## 2014-06-26 DIAGNOSIS — I471 Supraventricular tachycardia: Secondary | ICD-10-CM

## 2014-06-26 DIAGNOSIS — R000 Tachycardia, unspecified: Secondary | ICD-10-CM

## 2014-06-26 LAB — CBC WITH DIFFERENTIAL/PLATELET
Basophils Absolute: 0 10*3/uL (ref 0.0–0.1)
Basophils Relative: 0.6 % (ref 0.0–3.0)
Eosinophils Absolute: 0.2 10*3/uL (ref 0.0–0.7)
Eosinophils Relative: 4.5 % (ref 0.0–5.0)
HCT: 51.2 % (ref 39.0–52.0)
Hemoglobin: 16.5 g/dL (ref 13.0–17.0)
LYMPHS PCT: 38.6 % (ref 12.0–46.0)
Lymphs Abs: 1.7 10*3/uL (ref 0.7–4.0)
MCHC: 32.2 g/dL (ref 30.0–36.0)
MCV: 83.8 fl (ref 78.0–100.0)
MONOS PCT: 7.1 % (ref 3.0–12.0)
Monocytes Absolute: 0.3 10*3/uL (ref 0.1–1.0)
NEUTROS PCT: 49.2 % (ref 43.0–77.0)
Neutro Abs: 2.1 10*3/uL (ref 1.4–7.7)
Platelets: 212 10*3/uL (ref 150.0–400.0)
RBC: 6.1 Mil/uL — ABNORMAL HIGH (ref 4.22–5.81)
RDW: 15 % (ref 11.5–15.5)
WBC: 4.3 10*3/uL (ref 4.0–10.5)

## 2014-06-26 LAB — TSH: TSH: 3.19 u[IU]/mL (ref 0.35–4.50)

## 2014-06-26 MED ORDER — METOPROLOL SUCCINATE ER 50 MG PO TB24: 50.0000 mg | ORAL_TABLET | Freq: Two times a day (BID) | ORAL | Status: DC

## 2014-06-26 NOTE — Progress Notes (Addendum)
Patient here today for a 1 week follow up EKG/ BP check. He saw Dr. Radford Pax on 11/3 and at that time his HR was- 109 & BP- 122/82. He was on toprol 50 mg daily. Dr. Radford Pax increased his toprol to 75 mg daily at that time. The patient works [place] at night with the [employer]. He did work last night. He takes his medications about 10:00 pm. He did have cardiac rehab this morning and is just coming from that. He states he feels well with exercise and does not feel tachycardiac or have palpitations at night with his job. Per the patient's report, his night time heart rates are ~ 95 bpm. Today his BP is 128/70 and his HR- 105- sinus tach per his EKG. I reviewed with Dr. Radford Pax. Orders received to increase metoprolol succinate to 50 mg BID, obtain a TSH/ CBC today, and order an echo for tachycardia (rule out pericardial effusion). The patient is aware and verbalizes understanding.    Agree with above Signed: Fransico Him, MD

## 2014-06-26 NOTE — Patient Instructions (Signed)
Your physician has recommended you make the following change in your medication:  1) Increase toprol (metoprolol succinate) to 50 mg twice daily.  Your physician recommends that you have lab work: today- TSH/ CBC  Your physician has requested that you have an echocardiogram. Echocardiography is a painless test that uses sound waves to create images of your heart. It provides your doctor with information about the size and shape of your heart and how well your heart's chambers and valves are working. This procedure takes approximately one hour. There are no restrictions for this procedure.

## 2014-06-27 ENCOUNTER — Ambulatory Visit (HOSPITAL_COMMUNITY): Payer: 59 | Attending: Cardiology | Admitting: Radiology

## 2014-06-27 ENCOUNTER — Encounter: Payer: Self-pay | Admitting: Cardiology

## 2014-06-27 DIAGNOSIS — I471 Supraventricular tachycardia: Secondary | ICD-10-CM | POA: Insufficient documentation

## 2014-06-27 DIAGNOSIS — E785 Hyperlipidemia, unspecified: Secondary | ICD-10-CM | POA: Diagnosis not present

## 2014-06-27 DIAGNOSIS — I351 Nonrheumatic aortic (valve) insufficiency: Secondary | ICD-10-CM | POA: Insufficient documentation

## 2014-06-27 DIAGNOSIS — Z87891 Personal history of nicotine dependence: Secondary | ICD-10-CM | POA: Insufficient documentation

## 2014-06-27 DIAGNOSIS — I10 Essential (primary) hypertension: Secondary | ICD-10-CM | POA: Insufficient documentation

## 2014-06-27 DIAGNOSIS — E119 Type 2 diabetes mellitus without complications: Secondary | ICD-10-CM | POA: Diagnosis not present

## 2014-06-27 DIAGNOSIS — I350 Nonrheumatic aortic (valve) stenosis: Secondary | ICD-10-CM | POA: Insufficient documentation

## 2014-06-27 DIAGNOSIS — R000 Tachycardia, unspecified: Secondary | ICD-10-CM

## 2014-06-27 NOTE — Progress Notes (Signed)
Quick Note:  Please let patient know that labs were normal. Continue current medical therapy. ______

## 2014-06-27 NOTE — Progress Notes (Signed)
Echocardiogram performed.  

## 2014-07-01 ENCOUNTER — Encounter (HOSPITAL_COMMUNITY)
Admission: RE | Admit: 2014-07-01 | Discharge: 2014-07-01 | Disposition: A | Discharge status: Home / Self Care | Payer: Self-pay | Source: Ambulatory Visit | Attending: Cardiology | Admitting: Cardiology

## 2014-07-02 ENCOUNTER — Encounter (HOSPITAL_COMMUNITY): Payer: Self-pay

## 2014-07-03 ENCOUNTER — Encounter (HOSPITAL_COMMUNITY)
Admission: RE | Admit: 2014-07-03 | Discharge: 2014-07-03 | Disposition: A | Discharge status: Home / Self Care | Payer: Self-pay | Source: Ambulatory Visit | Attending: Cardiology | Admitting: Cardiology

## 2014-07-04 ENCOUNTER — Encounter (HOSPITAL_COMMUNITY)
Admission: RE | Admit: 2014-07-04 | Discharge: 2014-07-04 | Disposition: A | Discharge status: Home / Self Care | Payer: Self-pay | Source: Ambulatory Visit | Attending: Cardiology | Admitting: Cardiology

## 2014-07-08 ENCOUNTER — Encounter (HOSPITAL_COMMUNITY)
Admission: RE | Admit: 2014-07-08 | Discharge: 2014-07-08 | Disposition: A | Discharge status: Home / Self Care | Payer: Self-pay | Source: Ambulatory Visit | Attending: Cardiology | Admitting: Cardiology

## 2014-07-09 ENCOUNTER — Encounter (HOSPITAL_COMMUNITY)
Admission: RE | Admit: 2014-07-09 | Discharge: 2014-07-09 | Disposition: A | Discharge status: Home / Self Care | Payer: Self-pay | Source: Ambulatory Visit | Attending: Cardiology | Admitting: Cardiology

## 2014-07-15 ENCOUNTER — Encounter (HOSPITAL_COMMUNITY)
Admission: RE | Admit: 2014-07-15 | Discharge: 2014-07-15 | Disposition: A | Discharge status: Home / Self Care | Payer: Self-pay | Source: Ambulatory Visit | Attending: Cardiology | Admitting: Cardiology

## 2014-07-15 DIAGNOSIS — Z951 Presence of aortocoronary bypass graft: Secondary | ICD-10-CM | POA: Insufficient documentation

## 2014-07-15 DIAGNOSIS — Z5189 Encounter for other specified aftercare: Secondary | ICD-10-CM | POA: Insufficient documentation

## 2014-07-15 DIAGNOSIS — I2510 Atherosclerotic heart disease of native coronary artery without angina pectoris: Secondary | ICD-10-CM | POA: Insufficient documentation

## 2014-07-16 ENCOUNTER — Encounter (HOSPITAL_COMMUNITY): Payer: Self-pay

## 2014-07-17 ENCOUNTER — Encounter (HOSPITAL_COMMUNITY)
Admission: RE | Admit: 2014-07-17 | Discharge: 2014-07-17 | Disposition: A | Discharge status: Home / Self Care | Payer: Self-pay | Source: Ambulatory Visit | Attending: Cardiology | Admitting: Cardiology

## 2014-07-18 ENCOUNTER — Encounter (HOSPITAL_COMMUNITY)
Admission: RE | Admit: 2014-07-18 | Discharge: 2014-07-18 | Disposition: A | Discharge status: Home / Self Care | Payer: Self-pay | Source: Ambulatory Visit | Attending: Cardiology | Admitting: Cardiology

## 2014-07-22 ENCOUNTER — Encounter (HOSPITAL_COMMUNITY)
Admission: RE | Admit: 2014-07-22 | Discharge: 2014-07-22 | Disposition: A | Discharge status: Home / Self Care | Payer: Self-pay | Source: Ambulatory Visit | Attending: Cardiology | Admitting: Cardiology

## 2014-07-23 ENCOUNTER — Encounter (HOSPITAL_COMMUNITY): Payer: Self-pay

## 2014-07-24 ENCOUNTER — Encounter (HOSPITAL_COMMUNITY)
Admission: RE | Admit: 2014-07-24 | Discharge: 2014-07-24 | Disposition: A | Discharge status: Home / Self Care | Payer: Self-pay | Source: Ambulatory Visit | Attending: Cardiology | Admitting: Cardiology

## 2014-07-24 ENCOUNTER — Encounter (HOSPITAL_COMMUNITY): Payer: Self-pay | Admitting: Cardiology

## 2014-07-25 ENCOUNTER — Encounter (HOSPITAL_COMMUNITY)
Admission: RE | Admit: 2014-07-25 | Discharge: 2014-07-25 | Disposition: A | Discharge status: Home / Self Care | Payer: Self-pay | Source: Ambulatory Visit | Attending: Cardiology | Admitting: Cardiology

## 2014-07-29 ENCOUNTER — Encounter (HOSPITAL_COMMUNITY)
Admission: RE | Admit: 2014-07-29 | Discharge: 2014-07-29 | Disposition: A | Discharge status: Home / Self Care | Payer: Self-pay | Source: Ambulatory Visit | Attending: Cardiology | Admitting: Cardiology

## 2014-07-30 ENCOUNTER — Encounter (HOSPITAL_COMMUNITY): Payer: Self-pay

## 2014-07-31 ENCOUNTER — Encounter (HOSPITAL_COMMUNITY)
Admission: RE | Admit: 2014-07-31 | Discharge: 2014-07-31 | Disposition: A | Discharge status: Home / Self Care | Payer: Self-pay | Source: Ambulatory Visit | Attending: Cardiology | Admitting: Cardiology

## 2014-08-01 ENCOUNTER — Encounter (HOSPITAL_COMMUNITY)
Admission: RE | Admit: 2014-08-01 | Discharge: 2014-08-01 | Disposition: A | Discharge status: Home / Self Care | Payer: Self-pay | Source: Ambulatory Visit | Attending: Cardiology | Admitting: Cardiology

## 2014-08-05 ENCOUNTER — Encounter (HOSPITAL_COMMUNITY)
Admission: RE | Admit: 2014-08-05 | Discharge: 2014-08-05 | Disposition: A | Discharge status: Home / Self Care | Payer: Self-pay | Source: Ambulatory Visit | Attending: Cardiology | Admitting: Cardiology

## 2014-08-06 ENCOUNTER — Encounter (HOSPITAL_COMMUNITY)
Admission: RE | Admit: 2014-08-06 | Discharge: 2014-08-06 | Disposition: A | Discharge status: Home / Self Care | Payer: Self-pay | Source: Ambulatory Visit | Attending: Cardiology | Admitting: Cardiology

## 2014-08-07 ENCOUNTER — Encounter (HOSPITAL_COMMUNITY)
Admission: RE | Admit: 2014-08-07 | Discharge: 2014-08-07 | Disposition: A | Discharge status: Home / Self Care | Payer: Self-pay | Source: Ambulatory Visit | Attending: Cardiology | Admitting: Cardiology

## 2014-08-12 ENCOUNTER — Encounter (HOSPITAL_COMMUNITY): Payer: Self-pay

## 2014-08-13 ENCOUNTER — Encounter (HOSPITAL_COMMUNITY): Payer: Self-pay

## 2014-08-14 ENCOUNTER — Encounter (HOSPITAL_COMMUNITY): Payer: Self-pay

## 2014-08-19 ENCOUNTER — Encounter (HOSPITAL_COMMUNITY)
Admission: RE | Admit: 2014-08-19 | Discharge: 2014-08-19 | Disposition: A | Discharge status: Home / Self Care | Payer: Self-pay | Source: Ambulatory Visit | Attending: Cardiology | Admitting: Cardiology

## 2014-08-19 DIAGNOSIS — I2510 Atherosclerotic heart disease of native coronary artery without angina pectoris: Secondary | ICD-10-CM | POA: Insufficient documentation

## 2014-08-19 DIAGNOSIS — Z5189 Encounter for other specified aftercare: Secondary | ICD-10-CM | POA: Insufficient documentation

## 2014-08-19 DIAGNOSIS — Z951 Presence of aortocoronary bypass graft: Secondary | ICD-10-CM | POA: Insufficient documentation

## 2014-08-20 ENCOUNTER — Encounter (HOSPITAL_COMMUNITY): Payer: Self-pay

## 2014-08-21 ENCOUNTER — Encounter (HOSPITAL_COMMUNITY)
Admission: RE | Admit: 2014-08-21 | Discharge: 2014-08-21 | Disposition: A | Discharge status: Home / Self Care | Payer: Self-pay | Source: Ambulatory Visit | Attending: Cardiology | Admitting: Cardiology

## 2014-08-22 ENCOUNTER — Encounter (HOSPITAL_COMMUNITY)
Admission: RE | Admit: 2014-08-22 | Discharge: 2014-08-22 | Disposition: A | Discharge status: Home / Self Care | Payer: Self-pay | Source: Ambulatory Visit | Attending: Cardiology | Admitting: Cardiology

## 2014-08-26 ENCOUNTER — Encounter (HOSPITAL_COMMUNITY)
Admission: RE | Admit: 2014-08-26 | Discharge: 2014-08-26 | Disposition: A | Discharge status: Home / Self Care | Payer: Self-pay | Source: Ambulatory Visit | Attending: Cardiology | Admitting: Cardiology

## 2014-08-27 ENCOUNTER — Encounter (HOSPITAL_COMMUNITY): Payer: Self-pay

## 2014-08-28 ENCOUNTER — Encounter (HOSPITAL_COMMUNITY)
Admission: RE | Admit: 2014-08-28 | Discharge: 2014-08-28 | Disposition: A | Discharge status: Home / Self Care | Payer: Self-pay | Source: Ambulatory Visit | Attending: Cardiology | Admitting: Cardiology

## 2014-08-29 ENCOUNTER — Encounter (HOSPITAL_COMMUNITY)
Admission: RE | Admit: 2014-08-29 | Discharge: 2014-08-29 | Disposition: A | Discharge status: Home / Self Care | Payer: Self-pay | Source: Ambulatory Visit | Attending: Cardiology | Admitting: Cardiology

## 2014-09-02 ENCOUNTER — Encounter (HOSPITAL_COMMUNITY)
Admission: RE | Admit: 2014-09-02 | Discharge: 2014-09-02 | Disposition: A | Discharge status: Home / Self Care | Payer: Self-pay | Source: Ambulatory Visit | Attending: Cardiology | Admitting: Cardiology

## 2014-09-03 ENCOUNTER — Encounter (HOSPITAL_COMMUNITY): Payer: Self-pay

## 2014-09-04 ENCOUNTER — Encounter (HOSPITAL_COMMUNITY)
Admission: RE | Admit: 2014-09-04 | Discharge: 2014-09-04 | Disposition: A | Discharge status: Home / Self Care | Payer: Self-pay | Source: Ambulatory Visit | Attending: Cardiology | Admitting: Cardiology

## 2014-09-09 ENCOUNTER — Encounter (HOSPITAL_COMMUNITY)
Admission: RE | Admit: 2014-09-09 | Discharge: 2014-09-09 | Disposition: A | Discharge status: Home / Self Care | Payer: Self-pay | Source: Ambulatory Visit | Attending: Cardiology | Admitting: Cardiology

## 2014-09-10 ENCOUNTER — Encounter (HOSPITAL_COMMUNITY): Payer: Self-pay

## 2014-09-11 ENCOUNTER — Encounter (HOSPITAL_COMMUNITY)
Admission: RE | Admit: 2014-09-11 | Discharge: 2014-09-11 | Disposition: A | Discharge status: Home / Self Care | Payer: Self-pay | Source: Ambulatory Visit | Attending: Cardiology | Admitting: Cardiology

## 2014-09-12 ENCOUNTER — Encounter (HOSPITAL_COMMUNITY)
Admission: RE | Admit: 2014-09-12 | Discharge: 2014-09-12 | Disposition: A | Discharge status: Home / Self Care | Payer: Self-pay | Source: Ambulatory Visit | Attending: Cardiology | Admitting: Cardiology

## 2014-09-16 ENCOUNTER — Encounter (HOSPITAL_COMMUNITY): Payer: Self-pay

## 2014-09-16 DIAGNOSIS — I2510 Atherosclerotic heart disease of native coronary artery without angina pectoris: Secondary | ICD-10-CM | POA: Insufficient documentation

## 2014-09-16 DIAGNOSIS — Z951 Presence of aortocoronary bypass graft: Secondary | ICD-10-CM | POA: Insufficient documentation

## 2014-09-16 DIAGNOSIS — Z5189 Encounter for other specified aftercare: Secondary | ICD-10-CM | POA: Insufficient documentation

## 2014-09-17 ENCOUNTER — Encounter (HOSPITAL_COMMUNITY): Payer: Self-pay

## 2014-09-18 ENCOUNTER — Encounter (HOSPITAL_COMMUNITY)
Admission: RE | Admit: 2014-09-18 | Discharge: 2014-09-18 | Disposition: A | Discharge status: Home / Self Care | Payer: Self-pay | Source: Ambulatory Visit | Attending: Cardiology | Admitting: Cardiology

## 2014-09-19 ENCOUNTER — Encounter (HOSPITAL_COMMUNITY)
Admission: RE | Admit: 2014-09-19 | Discharge: 2014-09-19 | Disposition: A | Discharge status: Home / Self Care | Payer: Self-pay | Source: Ambulatory Visit | Attending: Cardiology | Admitting: Cardiology

## 2014-09-23 ENCOUNTER — Encounter (HOSPITAL_COMMUNITY)
Admission: RE | Admit: 2014-09-23 | Discharge: 2014-09-23 | Disposition: A | Discharge status: Home / Self Care | Payer: Self-pay | Source: Ambulatory Visit | Attending: Cardiology | Admitting: Cardiology

## 2014-09-24 ENCOUNTER — Encounter (HOSPITAL_COMMUNITY): Payer: Self-pay

## 2014-09-25 ENCOUNTER — Encounter (HOSPITAL_COMMUNITY)
Admission: RE | Admit: 2014-09-25 | Discharge: 2014-09-25 | Disposition: A | Discharge status: Home / Self Care | Payer: Self-pay | Source: Ambulatory Visit | Attending: Cardiology | Admitting: Cardiology

## 2014-09-26 ENCOUNTER — Encounter (HOSPITAL_COMMUNITY)
Admission: RE | Admit: 2014-09-26 | Discharge: 2014-09-26 | Disposition: A | Discharge status: Home / Self Care | Payer: Self-pay | Source: Ambulatory Visit | Attending: Cardiology | Admitting: Cardiology

## 2014-09-30 ENCOUNTER — Encounter (HOSPITAL_COMMUNITY): Payer: Self-pay

## 2014-10-01 ENCOUNTER — Encounter (HOSPITAL_COMMUNITY)
Admission: RE | Admit: 2014-10-01 | Discharge: 2014-10-01 | Disposition: A | Discharge status: Home / Self Care | Payer: Self-pay | Source: Ambulatory Visit | Attending: Cardiology | Admitting: Cardiology

## 2014-10-02 ENCOUNTER — Encounter (HOSPITAL_COMMUNITY)
Admission: RE | Admit: 2014-10-02 | Discharge: 2014-10-02 | Disposition: A | Discharge status: Home / Self Care | Payer: Self-pay | Source: Ambulatory Visit | Attending: Cardiology | Admitting: Cardiology

## 2014-10-07 ENCOUNTER — Encounter (HOSPITAL_COMMUNITY): Payer: Self-pay

## 2014-10-08 ENCOUNTER — Encounter (HOSPITAL_COMMUNITY)
Admission: RE | Admit: 2014-10-08 | Discharge: 2014-10-08 | Disposition: A | Discharge status: Home / Self Care | Payer: Self-pay | Source: Ambulatory Visit | Attending: Cardiology | Admitting: Cardiology

## 2014-10-09 ENCOUNTER — Encounter (HOSPITAL_COMMUNITY)
Admission: RE | Admit: 2014-10-09 | Discharge: 2014-10-09 | Disposition: A | Discharge status: Home / Self Care | Payer: Self-pay | Source: Ambulatory Visit | Attending: Cardiology | Admitting: Cardiology

## 2014-10-10 ENCOUNTER — Encounter (HOSPITAL_COMMUNITY)
Admission: RE | Admit: 2014-10-10 | Discharge: 2014-10-10 | Disposition: A | Discharge status: Home / Self Care | Payer: Self-pay | Source: Ambulatory Visit | Attending: Cardiology | Admitting: Cardiology

## 2014-10-10 LAB — GLUCOSE, CAPILLARY: Glucose-Capillary: 76 mg/dL (ref 70–99)

## 2014-10-14 ENCOUNTER — Encounter (HOSPITAL_COMMUNITY): Payer: Self-pay

## 2014-10-14 DIAGNOSIS — I2510 Atherosclerotic heart disease of native coronary artery without angina pectoris: Secondary | ICD-10-CM | POA: Insufficient documentation

## 2014-10-14 DIAGNOSIS — Z951 Presence of aortocoronary bypass graft: Secondary | ICD-10-CM | POA: Insufficient documentation

## 2014-10-14 DIAGNOSIS — Z5189 Encounter for other specified aftercare: Secondary | ICD-10-CM | POA: Insufficient documentation

## 2014-10-15 ENCOUNTER — Encounter (HOSPITAL_COMMUNITY)
Admission: RE | Admit: 2014-10-15 | Discharge: 2014-10-15 | Disposition: A | Discharge status: Home / Self Care | Payer: Self-pay | Source: Ambulatory Visit | Attending: Cardiology | Admitting: Cardiology

## 2014-10-16 ENCOUNTER — Encounter (HOSPITAL_COMMUNITY)
Admission: RE | Admit: 2014-10-16 | Discharge: 2014-10-16 | Disposition: A | Discharge status: Home / Self Care | Payer: Self-pay | Source: Ambulatory Visit | Attending: Cardiology | Admitting: Cardiology

## 2014-10-17 ENCOUNTER — Encounter (HOSPITAL_COMMUNITY)
Admission: RE | Admit: 2014-10-17 | Discharge: 2014-10-17 | Disposition: A | Discharge status: Home / Self Care | Payer: Self-pay | Source: Ambulatory Visit | Attending: Cardiology | Admitting: Cardiology

## 2014-10-21 ENCOUNTER — Encounter (HOSPITAL_COMMUNITY): Payer: Self-pay

## 2014-10-22 ENCOUNTER — Encounter (HOSPITAL_COMMUNITY)
Admission: RE | Admit: 2014-10-22 | Discharge: 2014-10-22 | Disposition: A | Discharge status: Home / Self Care | Payer: Self-pay | Source: Ambulatory Visit | Attending: Cardiology | Admitting: Cardiology

## 2014-10-23 ENCOUNTER — Encounter (HOSPITAL_COMMUNITY)
Admission: RE | Admit: 2014-10-23 | Discharge: 2014-10-23 | Disposition: A | Discharge status: Home / Self Care | Payer: Self-pay | Source: Ambulatory Visit | Attending: Cardiology | Admitting: Cardiology

## 2014-10-28 ENCOUNTER — Encounter (HOSPITAL_COMMUNITY)
Admission: RE | Admit: 2014-10-28 | Discharge: 2014-10-28 | Disposition: A | Discharge status: Home / Self Care | Payer: Self-pay | Source: Ambulatory Visit | Attending: Cardiology | Admitting: Cardiology

## 2014-10-29 ENCOUNTER — Encounter (HOSPITAL_COMMUNITY): Payer: Self-pay

## 2014-10-30 ENCOUNTER — Encounter (HOSPITAL_COMMUNITY)
Admission: RE | Admit: 2014-10-30 | Discharge: 2014-10-30 | Disposition: A | Discharge status: Home / Self Care | Payer: Self-pay | Source: Ambulatory Visit | Attending: Cardiology | Admitting: Cardiology

## 2014-10-31 ENCOUNTER — Encounter (HOSPITAL_COMMUNITY)
Admission: RE | Admit: 2014-10-31 | Discharge: 2014-10-31 | Disposition: A | Discharge status: Home / Self Care | Payer: Self-pay | Source: Ambulatory Visit | Attending: Cardiology | Admitting: Cardiology

## 2014-11-04 ENCOUNTER — Encounter (HOSPITAL_COMMUNITY)
Admission: RE | Admit: 2014-11-04 | Discharge: 2014-11-04 | Disposition: A | Discharge status: Home / Self Care | Payer: Self-pay | Source: Ambulatory Visit | Attending: Cardiology | Admitting: Cardiology

## 2014-11-05 ENCOUNTER — Encounter (HOSPITAL_COMMUNITY): Payer: Self-pay

## 2014-11-06 ENCOUNTER — Encounter (HOSPITAL_COMMUNITY)
Admission: RE | Admit: 2014-11-06 | Discharge: 2014-11-06 | Disposition: A | Discharge status: Home / Self Care | Payer: Self-pay | Source: Ambulatory Visit | Attending: Cardiology | Admitting: Cardiology

## 2014-11-07 ENCOUNTER — Encounter (HOSPITAL_COMMUNITY)
Admission: RE | Admit: 2014-11-07 | Discharge: 2014-11-07 | Disposition: A | Discharge status: Home / Self Care | Payer: Self-pay | Source: Ambulatory Visit | Attending: Cardiology | Admitting: Cardiology

## 2014-11-11 ENCOUNTER — Encounter (HOSPITAL_COMMUNITY)
Admission: RE | Admit: 2014-11-11 | Discharge: 2014-11-11 | Disposition: A | Discharge status: Home / Self Care | Payer: Self-pay | Source: Ambulatory Visit | Attending: Cardiology | Admitting: Cardiology

## 2014-11-12 ENCOUNTER — Encounter (HOSPITAL_COMMUNITY): Payer: Self-pay

## 2014-11-13 ENCOUNTER — Encounter (HOSPITAL_COMMUNITY)
Admission: RE | Admit: 2014-11-13 | Discharge: 2014-11-13 | Disposition: A | Discharge status: Home / Self Care | Payer: Self-pay | Source: Ambulatory Visit | Attending: Cardiology | Admitting: Cardiology

## 2014-11-14 ENCOUNTER — Encounter (HOSPITAL_COMMUNITY)
Admission: RE | Admit: 2014-11-14 | Discharge: 2014-11-14 | Disposition: A | Discharge status: Home / Self Care | Payer: Self-pay | Source: Ambulatory Visit | Attending: Cardiology | Admitting: Cardiology

## 2014-11-14 DIAGNOSIS — Z5189 Encounter for other specified aftercare: Secondary | ICD-10-CM | POA: Insufficient documentation

## 2014-11-14 DIAGNOSIS — I2510 Atherosclerotic heart disease of native coronary artery without angina pectoris: Secondary | ICD-10-CM | POA: Insufficient documentation

## 2014-11-14 DIAGNOSIS — Z951 Presence of aortocoronary bypass graft: Secondary | ICD-10-CM | POA: Insufficient documentation

## 2014-11-18 ENCOUNTER — Encounter (HOSPITAL_COMMUNITY): Payer: Self-pay

## 2014-11-19 ENCOUNTER — Encounter (HOSPITAL_COMMUNITY): Payer: Self-pay

## 2014-11-20 ENCOUNTER — Encounter (HOSPITAL_COMMUNITY)
Admission: RE | Admit: 2014-11-20 | Discharge: 2014-11-20 | Disposition: A | Discharge status: Home / Self Care | Payer: Self-pay | Source: Ambulatory Visit | Attending: Cardiology | Admitting: Cardiology

## 2014-11-21 ENCOUNTER — Encounter (HOSPITAL_COMMUNITY)
Admission: RE | Admit: 2014-11-21 | Discharge: 2014-11-21 | Disposition: A | Discharge status: Home / Self Care | Payer: Self-pay | Source: Ambulatory Visit | Attending: Cardiology | Admitting: Cardiology

## 2014-11-25 ENCOUNTER — Encounter (HOSPITAL_COMMUNITY)
Admission: RE | Admit: 2014-11-25 | Discharge: 2014-11-25 | Disposition: A | Discharge status: Home / Self Care | Payer: Self-pay | Source: Ambulatory Visit | Attending: Cardiology | Admitting: Cardiology

## 2014-11-26 ENCOUNTER — Encounter (HOSPITAL_COMMUNITY): Payer: Self-pay

## 2014-11-27 ENCOUNTER — Encounter (HOSPITAL_COMMUNITY)
Admission: RE | Admit: 2014-11-27 | Discharge: 2014-11-27 | Disposition: A | Discharge status: Home / Self Care | Payer: Self-pay | Source: Ambulatory Visit | Attending: Cardiology | Admitting: Cardiology

## 2014-12-02 ENCOUNTER — Encounter (HOSPITAL_COMMUNITY)
Admission: RE | Admit: 2014-12-02 | Discharge: 2014-12-02 | Disposition: A | Discharge status: Home / Self Care | Payer: Self-pay | Source: Ambulatory Visit | Attending: Cardiology | Admitting: Cardiology

## 2014-12-03 ENCOUNTER — Encounter (HOSPITAL_COMMUNITY): Payer: Self-pay

## 2014-12-04 ENCOUNTER — Encounter (HOSPITAL_COMMUNITY)
Admission: RE | Admit: 2014-12-04 | Discharge: 2014-12-04 | Disposition: A | Discharge status: Home / Self Care | Payer: Self-pay | Source: Ambulatory Visit | Attending: Cardiology | Admitting: Cardiology

## 2014-12-05 ENCOUNTER — Encounter (HOSPITAL_COMMUNITY)
Admission: RE | Admit: 2014-12-05 | Discharge: 2014-12-05 | Disposition: A | Discharge status: Home / Self Care | Payer: Self-pay | Source: Ambulatory Visit | Attending: Cardiology | Admitting: Cardiology

## 2014-12-09 ENCOUNTER — Encounter (HOSPITAL_COMMUNITY)
Admission: RE | Admit: 2014-12-09 | Discharge: 2014-12-09 | Disposition: A | Discharge status: Home / Self Care | Payer: Self-pay | Source: Ambulatory Visit | Attending: Cardiology | Admitting: Cardiology

## 2014-12-10 ENCOUNTER — Encounter (HOSPITAL_COMMUNITY): Payer: Self-pay

## 2014-12-11 ENCOUNTER — Encounter (HOSPITAL_COMMUNITY)
Admission: RE | Admit: 2014-12-11 | Discharge: 2014-12-11 | Disposition: A | Discharge status: Home / Self Care | Payer: Self-pay | Source: Ambulatory Visit | Attending: Cardiology | Admitting: Cardiology

## 2014-12-12 ENCOUNTER — Encounter (HOSPITAL_COMMUNITY)
Admission: RE | Admit: 2014-12-12 | Discharge: 2014-12-12 | Disposition: A | Discharge status: Home / Self Care | Payer: Self-pay | Source: Ambulatory Visit | Attending: Cardiology | Admitting: Cardiology

## 2014-12-16 ENCOUNTER — Encounter (HOSPITAL_COMMUNITY)
Admission: RE | Admit: 2014-12-16 | Discharge: 2014-12-16 | Disposition: A | Discharge status: Home / Self Care | Payer: Self-pay | Source: Ambulatory Visit | Attending: Cardiology | Admitting: Cardiology

## 2014-12-16 DIAGNOSIS — Z5189 Encounter for other specified aftercare: Secondary | ICD-10-CM | POA: Insufficient documentation

## 2014-12-16 DIAGNOSIS — I2510 Atherosclerotic heart disease of native coronary artery without angina pectoris: Secondary | ICD-10-CM | POA: Insufficient documentation

## 2014-12-16 DIAGNOSIS — Z951 Presence of aortocoronary bypass graft: Secondary | ICD-10-CM | POA: Insufficient documentation

## 2014-12-17 ENCOUNTER — Encounter (HOSPITAL_COMMUNITY): Payer: Self-pay

## 2014-12-18 ENCOUNTER — Encounter (HOSPITAL_COMMUNITY)
Admission: RE | Admit: 2014-12-18 | Discharge: 2014-12-18 | Disposition: A | Discharge status: Home / Self Care | Payer: Self-pay | Source: Ambulatory Visit | Attending: Cardiology | Admitting: Cardiology

## 2014-12-19 ENCOUNTER — Encounter (HOSPITAL_COMMUNITY)
Admission: RE | Admit: 2014-12-19 | Discharge: 2014-12-19 | Disposition: A | Discharge status: Home / Self Care | Payer: Self-pay | Source: Ambulatory Visit | Attending: Cardiology | Admitting: Cardiology

## 2014-12-23 ENCOUNTER — Encounter (HOSPITAL_COMMUNITY)
Admission: RE | Admit: 2014-12-23 | Discharge: 2014-12-23 | Disposition: A | Discharge status: Home / Self Care | Payer: Self-pay | Source: Ambulatory Visit | Attending: Cardiology | Admitting: Cardiology

## 2014-12-24 ENCOUNTER — Encounter (HOSPITAL_COMMUNITY): Payer: Self-pay

## 2014-12-25 ENCOUNTER — Encounter (HOSPITAL_COMMUNITY)
Admission: RE | Admit: 2014-12-25 | Discharge: 2014-12-25 | Disposition: A | Discharge status: Home / Self Care | Payer: Self-pay | Source: Ambulatory Visit | Attending: Cardiology | Admitting: Cardiology

## 2014-12-26 ENCOUNTER — Encounter (HOSPITAL_COMMUNITY)
Admission: RE | Admit: 2014-12-26 | Discharge: 2014-12-26 | Disposition: A | Discharge status: Home / Self Care | Payer: Self-pay | Source: Ambulatory Visit | Attending: Cardiology | Admitting: Cardiology

## 2014-12-30 ENCOUNTER — Encounter (HOSPITAL_COMMUNITY)
Admission: RE | Admit: 2014-12-30 | Discharge: 2014-12-30 | Disposition: A | Discharge status: Home / Self Care | Payer: Self-pay | Source: Ambulatory Visit | Attending: Cardiology | Admitting: Cardiology

## 2014-12-31 ENCOUNTER — Encounter (HOSPITAL_COMMUNITY): Payer: Self-pay

## 2015-01-01 ENCOUNTER — Encounter (HOSPITAL_COMMUNITY)
Admission: RE | Admit: 2015-01-01 | Discharge: 2015-01-01 | Disposition: A | Discharge status: Home / Self Care | Payer: Self-pay | Source: Ambulatory Visit | Attending: Cardiology | Admitting: Cardiology

## 2015-01-02 ENCOUNTER — Encounter (HOSPITAL_COMMUNITY)
Admission: RE | Admit: 2015-01-02 | Discharge: 2015-01-02 | Disposition: A | Discharge status: Home / Self Care | Payer: Self-pay | Source: Ambulatory Visit | Attending: Cardiology | Admitting: Cardiology

## 2015-01-06 ENCOUNTER — Encounter (HOSPITAL_COMMUNITY)
Admission: RE | Admit: 2015-01-06 | Discharge: 2015-01-06 | Disposition: A | Discharge status: Home / Self Care | Payer: Self-pay | Source: Ambulatory Visit | Attending: Cardiology | Admitting: Cardiology

## 2015-01-07 ENCOUNTER — Encounter (HOSPITAL_COMMUNITY): Payer: Self-pay

## 2015-01-08 ENCOUNTER — Encounter (HOSPITAL_COMMUNITY)
Admission: RE | Admit: 2015-01-08 | Discharge: 2015-01-08 | Disposition: A | Discharge status: Home / Self Care | Payer: Self-pay | Source: Ambulatory Visit | Attending: Cardiology | Admitting: Cardiology

## 2015-01-09 ENCOUNTER — Encounter (HOSPITAL_COMMUNITY)
Admission: RE | Admit: 2015-01-09 | Discharge: 2015-01-09 | Disposition: A | Discharge status: Home / Self Care | Payer: Self-pay | Source: Ambulatory Visit | Attending: Cardiology | Admitting: Cardiology

## 2015-01-13 ENCOUNTER — Encounter (HOSPITAL_COMMUNITY)
Admission: RE | Admit: 2015-01-13 | Discharge: 2015-01-13 | Disposition: A | Discharge status: Home / Self Care | Payer: Self-pay | Source: Ambulatory Visit | Attending: Cardiology | Admitting: Cardiology

## 2015-01-16 ENCOUNTER — Encounter (HOSPITAL_COMMUNITY)
Admission: RE | Admit: 2015-01-16 | Discharge: 2015-01-16 | Disposition: A | Discharge status: Home / Self Care | Payer: Self-pay | Source: Ambulatory Visit | Attending: Cardiology | Admitting: Cardiology

## 2015-01-16 DIAGNOSIS — Z5189 Encounter for other specified aftercare: Secondary | ICD-10-CM | POA: Insufficient documentation

## 2015-01-16 DIAGNOSIS — I2510 Atherosclerotic heart disease of native coronary artery without angina pectoris: Secondary | ICD-10-CM | POA: Insufficient documentation

## 2015-01-16 DIAGNOSIS — Z951 Presence of aortocoronary bypass graft: Secondary | ICD-10-CM | POA: Insufficient documentation

## 2015-01-22 ENCOUNTER — Encounter (HOSPITAL_COMMUNITY)
Admission: RE | Admit: 2015-01-22 | Discharge: 2015-01-22 | Disposition: A | Discharge status: Home / Self Care | Payer: Self-pay | Source: Ambulatory Visit | Attending: Cardiology | Admitting: Cardiology

## 2015-01-23 ENCOUNTER — Encounter (HOSPITAL_COMMUNITY)
Admission: RE | Admit: 2015-01-23 | Discharge: 2015-01-23 | Disposition: A | Discharge status: Home / Self Care | Payer: Self-pay | Source: Ambulatory Visit | Attending: Cardiology | Admitting: Cardiology

## 2015-01-27 ENCOUNTER — Encounter (HOSPITAL_COMMUNITY)
Admission: RE | Admit: 2015-01-27 | Discharge: 2015-01-27 | Disposition: A | Discharge status: Home / Self Care | Payer: Self-pay | Source: Ambulatory Visit | Attending: Cardiology | Admitting: Cardiology

## 2015-01-28 ENCOUNTER — Encounter (HOSPITAL_COMMUNITY): Payer: Self-pay

## 2015-01-29 ENCOUNTER — Encounter (HOSPITAL_COMMUNITY)
Admission: RE | Admit: 2015-01-29 | Discharge: 2015-01-29 | Disposition: A | Discharge status: Home / Self Care | Payer: Self-pay | Source: Ambulatory Visit | Attending: Cardiology | Admitting: Cardiology

## 2015-02-03 ENCOUNTER — Encounter (HOSPITAL_COMMUNITY)
Admission: RE | Admit: 2015-02-03 | Discharge: 2015-02-03 | Disposition: A | Discharge status: Home / Self Care | Payer: Self-pay | Source: Ambulatory Visit | Attending: Cardiology | Admitting: Cardiology

## 2015-02-04 ENCOUNTER — Encounter (HOSPITAL_COMMUNITY): Payer: Self-pay

## 2015-02-05 ENCOUNTER — Encounter (HOSPITAL_COMMUNITY): Payer: Self-pay

## 2015-02-06 ENCOUNTER — Other Ambulatory Visit: Payer: Self-pay | Admitting: Cardiology

## 2015-02-10 ENCOUNTER — Encounter (HOSPITAL_COMMUNITY)
Admission: RE | Admit: 2015-02-10 | Discharge: 2015-02-10 | Disposition: A | Discharge status: Home / Self Care | Payer: Self-pay | Source: Ambulatory Visit | Attending: Cardiology | Admitting: Cardiology

## 2015-02-11 ENCOUNTER — Encounter (HOSPITAL_COMMUNITY): Payer: Self-pay

## 2015-02-12 ENCOUNTER — Encounter (HOSPITAL_COMMUNITY)
Admission: RE | Admit: 2015-02-12 | Discharge: 2015-02-12 | Disposition: A | Discharge status: Home / Self Care | Payer: Self-pay | Source: Ambulatory Visit | Attending: Cardiology | Admitting: Cardiology

## 2015-02-17 ENCOUNTER — Encounter (HOSPITAL_COMMUNITY)
Admission: RE | Admit: 2015-02-17 | Discharge: 2015-02-17 | Disposition: A | Discharge status: Home / Self Care | Payer: Self-pay | Source: Ambulatory Visit | Attending: Cardiology | Admitting: Cardiology

## 2015-02-17 DIAGNOSIS — Z951 Presence of aortocoronary bypass graft: Secondary | ICD-10-CM | POA: Insufficient documentation

## 2015-02-17 DIAGNOSIS — Z5189 Encounter for other specified aftercare: Secondary | ICD-10-CM | POA: Insufficient documentation

## 2015-02-17 DIAGNOSIS — I2510 Atherosclerotic heart disease of native coronary artery without angina pectoris: Secondary | ICD-10-CM | POA: Insufficient documentation

## 2015-02-18 ENCOUNTER — Encounter (HOSPITAL_COMMUNITY): Payer: Self-pay

## 2015-02-19 ENCOUNTER — Encounter (HOSPITAL_COMMUNITY)
Admission: RE | Admit: 2015-02-19 | Discharge: 2015-02-19 | Disposition: A | Discharge status: Home / Self Care | Payer: Self-pay | Source: Ambulatory Visit | Attending: Cardiology | Admitting: Cardiology

## 2015-02-24 ENCOUNTER — Encounter (HOSPITAL_COMMUNITY)
Admission: RE | Admit: 2015-02-24 | Discharge: 2015-02-24 | Disposition: A | Discharge status: Home / Self Care | Payer: Self-pay | Source: Ambulatory Visit | Attending: Cardiology | Admitting: Cardiology

## 2015-02-25 ENCOUNTER — Encounter (HOSPITAL_COMMUNITY): Payer: Self-pay

## 2015-02-26 ENCOUNTER — Encounter (HOSPITAL_COMMUNITY)
Admission: RE | Admit: 2015-02-26 | Discharge: 2015-02-26 | Disposition: A | Discharge status: Home / Self Care | Payer: Self-pay | Source: Ambulatory Visit | Attending: Cardiology | Admitting: Cardiology

## 2015-02-27 ENCOUNTER — Encounter (HOSPITAL_COMMUNITY)
Admission: RE | Admit: 2015-02-27 | Discharge: 2015-02-27 | Disposition: A | Discharge status: Home / Self Care | Payer: Self-pay | Source: Ambulatory Visit | Attending: Cardiology | Admitting: Cardiology

## 2015-03-03 ENCOUNTER — Encounter (HOSPITAL_COMMUNITY)
Admission: RE | Admit: 2015-03-03 | Discharge: 2015-03-03 | Disposition: A | Discharge status: Home / Self Care | Payer: Self-pay | Source: Ambulatory Visit | Attending: Cardiology | Admitting: Cardiology

## 2015-03-04 ENCOUNTER — Encounter (HOSPITAL_COMMUNITY): Payer: Self-pay

## 2015-03-05 ENCOUNTER — Encounter (HOSPITAL_COMMUNITY)
Admission: RE | Admit: 2015-03-05 | Discharge: 2015-03-05 | Disposition: A | Discharge status: Home / Self Care | Payer: Self-pay | Source: Ambulatory Visit | Attending: Cardiology | Admitting: Cardiology

## 2015-03-10 ENCOUNTER — Encounter (HOSPITAL_COMMUNITY)
Admission: RE | Admit: 2015-03-10 | Discharge: 2015-03-10 | Disposition: A | Discharge status: Home / Self Care | Payer: Self-pay | Source: Ambulatory Visit | Attending: Cardiology | Admitting: Cardiology

## 2015-03-11 ENCOUNTER — Encounter (HOSPITAL_COMMUNITY): Payer: Self-pay

## 2015-03-12 ENCOUNTER — Encounter (HOSPITAL_COMMUNITY)
Admission: RE | Admit: 2015-03-12 | Discharge: 2015-03-12 | Disposition: A | Discharge status: Home / Self Care | Payer: Self-pay | Source: Ambulatory Visit | Attending: Cardiology | Admitting: Cardiology

## 2015-03-13 ENCOUNTER — Encounter (HOSPITAL_COMMUNITY)
Admission: RE | Admit: 2015-03-13 | Discharge: 2015-03-13 | Disposition: A | Discharge status: Home / Self Care | Payer: Self-pay | Source: Ambulatory Visit | Attending: Cardiology | Admitting: Cardiology

## 2015-03-17 ENCOUNTER — Encounter (HOSPITAL_COMMUNITY)
Admission: RE | Admit: 2015-03-17 | Discharge: 2015-03-17 | Disposition: A | Discharge status: Home / Self Care | Payer: Self-pay | Source: Ambulatory Visit | Attending: Cardiology | Admitting: Cardiology

## 2015-03-17 DIAGNOSIS — Z951 Presence of aortocoronary bypass graft: Secondary | ICD-10-CM | POA: Insufficient documentation

## 2015-03-17 DIAGNOSIS — I2510 Atherosclerotic heart disease of native coronary artery without angina pectoris: Secondary | ICD-10-CM | POA: Insufficient documentation

## 2015-03-17 DIAGNOSIS — Z5189 Encounter for other specified aftercare: Secondary | ICD-10-CM | POA: Insufficient documentation

## 2015-03-18 ENCOUNTER — Encounter (HOSPITAL_COMMUNITY): Payer: Self-pay

## 2015-03-19 ENCOUNTER — Encounter (HOSPITAL_COMMUNITY)
Admission: RE | Admit: 2015-03-19 | Discharge: 2015-03-19 | Disposition: A | Discharge status: Home / Self Care | Payer: Self-pay | Source: Ambulatory Visit | Attending: Cardiology | Admitting: Cardiology

## 2015-03-20 ENCOUNTER — Encounter (HOSPITAL_COMMUNITY)
Admission: RE | Admit: 2015-03-20 | Discharge: 2015-03-20 | Disposition: A | Discharge status: Home / Self Care | Payer: Self-pay | Source: Ambulatory Visit | Attending: Cardiology | Admitting: Cardiology

## 2015-03-24 ENCOUNTER — Encounter (HOSPITAL_COMMUNITY)
Admission: RE | Admit: 2015-03-24 | Discharge: 2015-03-24 | Disposition: A | Discharge status: Home / Self Care | Payer: Self-pay | Source: Ambulatory Visit | Attending: Cardiology | Admitting: Cardiology

## 2015-03-25 ENCOUNTER — Encounter (HOSPITAL_COMMUNITY): Payer: Self-pay

## 2015-03-26 ENCOUNTER — Encounter (HOSPITAL_COMMUNITY)
Admission: RE | Admit: 2015-03-26 | Discharge: 2015-03-26 | Disposition: A | Discharge status: Home / Self Care | Payer: Self-pay | Source: Ambulatory Visit | Attending: Cardiology | Admitting: Cardiology

## 2015-03-27 ENCOUNTER — Encounter (HOSPITAL_COMMUNITY)
Admission: RE | Admit: 2015-03-27 | Discharge: 2015-03-27 | Disposition: A | Discharge status: Home / Self Care | Payer: Self-pay | Source: Ambulatory Visit | Attending: Cardiology | Admitting: Cardiology

## 2015-03-31 ENCOUNTER — Encounter (HOSPITAL_COMMUNITY)
Admission: RE | Admit: 2015-03-31 | Discharge: 2015-03-31 | Disposition: A | Discharge status: Home / Self Care | Payer: Self-pay | Source: Ambulatory Visit | Attending: Cardiology | Admitting: Cardiology

## 2015-04-01 ENCOUNTER — Encounter (HOSPITAL_COMMUNITY): Payer: Self-pay

## 2015-04-02 ENCOUNTER — Encounter (HOSPITAL_COMMUNITY)
Admission: RE | Admit: 2015-04-02 | Discharge: 2015-04-02 | Disposition: A | Discharge status: Home / Self Care | Payer: Self-pay | Source: Ambulatory Visit | Attending: Cardiology | Admitting: Cardiology

## 2015-04-03 ENCOUNTER — Encounter (HOSPITAL_COMMUNITY)
Admission: RE | Admit: 2015-04-03 | Discharge: 2015-04-03 | Disposition: A | Discharge status: Home / Self Care | Payer: Self-pay | Source: Ambulatory Visit | Attending: Cardiology | Admitting: Cardiology

## 2015-04-07 ENCOUNTER — Encounter (HOSPITAL_COMMUNITY)
Admission: RE | Admit: 2015-04-07 | Discharge: 2015-04-07 | Disposition: A | Discharge status: Home / Self Care | Payer: Self-pay | Source: Ambulatory Visit | Attending: Cardiology | Admitting: Cardiology

## 2015-04-08 ENCOUNTER — Encounter (HOSPITAL_COMMUNITY): Payer: Self-pay

## 2015-04-09 ENCOUNTER — Encounter (HOSPITAL_COMMUNITY)
Admission: RE | Admit: 2015-04-09 | Discharge: 2015-04-09 | Disposition: A | Discharge status: Home / Self Care | Payer: Self-pay | Source: Ambulatory Visit | Attending: Cardiology | Admitting: Cardiology

## 2015-04-10 ENCOUNTER — Encounter (HOSPITAL_COMMUNITY)
Admission: RE | Admit: 2015-04-10 | Discharge: 2015-04-10 | Disposition: A | Discharge status: Home / Self Care | Payer: Self-pay | Source: Ambulatory Visit | Attending: Cardiology | Admitting: Cardiology

## 2015-04-14 ENCOUNTER — Encounter (HOSPITAL_COMMUNITY): Payer: Self-pay

## 2015-04-15 ENCOUNTER — Encounter (HOSPITAL_COMMUNITY): Payer: Self-pay

## 2015-04-16 ENCOUNTER — Encounter (HOSPITAL_COMMUNITY)
Admission: RE | Admit: 2015-04-16 | Discharge: 2015-04-16 | Disposition: A | Discharge status: Home / Self Care | Payer: Self-pay | Source: Ambulatory Visit | Attending: Cardiology | Admitting: Cardiology

## 2015-04-16 DIAGNOSIS — Z951 Presence of aortocoronary bypass graft: Secondary | ICD-10-CM | POA: Insufficient documentation

## 2015-04-16 DIAGNOSIS — I2510 Atherosclerotic heart disease of native coronary artery without angina pectoris: Secondary | ICD-10-CM | POA: Insufficient documentation

## 2015-04-16 DIAGNOSIS — Z5189 Encounter for other specified aftercare: Secondary | ICD-10-CM | POA: Insufficient documentation

## 2015-04-17 ENCOUNTER — Encounter (HOSPITAL_COMMUNITY)
Admission: RE | Admit: 2015-04-17 | Discharge: 2015-04-17 | Disposition: A | Discharge status: Home / Self Care | Payer: Self-pay | Source: Ambulatory Visit | Attending: Cardiology | Admitting: Cardiology

## 2015-04-21 ENCOUNTER — Encounter (HOSPITAL_COMMUNITY)
Admission: RE | Admit: 2015-04-21 | Discharge: 2015-04-21 | Disposition: A | Discharge status: Home / Self Care | Payer: Self-pay | Source: Ambulatory Visit | Attending: Cardiology | Admitting: Cardiology

## 2015-04-22 ENCOUNTER — Encounter (HOSPITAL_COMMUNITY): Payer: Self-pay

## 2015-04-23 ENCOUNTER — Encounter (HOSPITAL_COMMUNITY)
Admission: RE | Admit: 2015-04-23 | Discharge: 2015-04-23 | Disposition: A | Discharge status: Home / Self Care | Payer: Self-pay | Source: Ambulatory Visit | Attending: Cardiology | Admitting: Cardiology

## 2015-04-24 ENCOUNTER — Encounter (HOSPITAL_COMMUNITY)
Admission: RE | Admit: 2015-04-24 | Discharge: 2015-04-24 | Disposition: A | Discharge status: Home / Self Care | Payer: Self-pay | Source: Ambulatory Visit | Attending: Cardiology | Admitting: Cardiology

## 2015-04-28 ENCOUNTER — Encounter (HOSPITAL_COMMUNITY)
Admission: RE | Admit: 2015-04-28 | Discharge: 2015-04-28 | Disposition: A | Discharge status: Home / Self Care | Payer: Self-pay | Source: Ambulatory Visit | Attending: Cardiology | Admitting: Cardiology

## 2015-04-29 ENCOUNTER — Encounter (HOSPITAL_COMMUNITY): Payer: Self-pay

## 2015-04-30 ENCOUNTER — Encounter (HOSPITAL_COMMUNITY)
Admission: RE | Admit: 2015-04-30 | Discharge: 2015-04-30 | Disposition: A | Discharge status: Home / Self Care | Payer: Self-pay | Source: Ambulatory Visit | Attending: Cardiology | Admitting: Cardiology

## 2015-05-01 ENCOUNTER — Encounter (HOSPITAL_COMMUNITY)
Admission: RE | Admit: 2015-05-01 | Discharge: 2015-05-01 | Disposition: A | Discharge status: Home / Self Care | Payer: Self-pay | Source: Ambulatory Visit | Attending: Cardiology | Admitting: Cardiology

## 2015-05-05 ENCOUNTER — Encounter (HOSPITAL_COMMUNITY)
Admission: RE | Admit: 2015-05-05 | Discharge: 2015-05-05 | Disposition: A | Discharge status: Home / Self Care | Payer: Self-pay | Source: Ambulatory Visit | Attending: Cardiology | Admitting: Cardiology

## 2015-05-06 ENCOUNTER — Encounter (HOSPITAL_COMMUNITY): Payer: Self-pay

## 2015-05-07 ENCOUNTER — Encounter (HOSPITAL_COMMUNITY)
Admission: RE | Admit: 2015-05-07 | Discharge: 2015-05-07 | Disposition: A | Discharge status: Home / Self Care | Payer: Self-pay | Source: Ambulatory Visit | Attending: Cardiology | Admitting: Cardiology

## 2015-05-08 ENCOUNTER — Encounter (HOSPITAL_COMMUNITY)
Admission: RE | Admit: 2015-05-08 | Discharge: 2015-05-08 | Disposition: A | Discharge status: Home / Self Care | Payer: Self-pay | Source: Ambulatory Visit | Attending: Cardiology | Admitting: Cardiology

## 2015-05-12 ENCOUNTER — Other Ambulatory Visit: Payer: Self-pay | Admitting: Cardiology

## 2015-05-12 ENCOUNTER — Encounter (HOSPITAL_COMMUNITY)
Admission: RE | Admit: 2015-05-12 | Discharge: 2015-05-12 | Disposition: A | Discharge status: Home / Self Care | Payer: Self-pay | Source: Ambulatory Visit | Attending: Cardiology | Admitting: Cardiology

## 2015-05-13 ENCOUNTER — Encounter (HOSPITAL_COMMUNITY): Payer: Self-pay

## 2015-05-14 ENCOUNTER — Encounter (HOSPITAL_COMMUNITY)
Admission: RE | Admit: 2015-05-14 | Discharge: 2015-05-14 | Disposition: A | Discharge status: Home / Self Care | Payer: Self-pay | Source: Ambulatory Visit | Attending: Cardiology | Admitting: Cardiology

## 2015-05-15 ENCOUNTER — Encounter (HOSPITAL_COMMUNITY)
Admission: RE | Admit: 2015-05-15 | Discharge: 2015-05-15 | Disposition: A | Discharge status: Home / Self Care | Payer: Self-pay | Source: Ambulatory Visit | Attending: Cardiology | Admitting: Cardiology

## 2015-05-19 ENCOUNTER — Encounter (HOSPITAL_COMMUNITY)
Admission: RE | Admit: 2015-05-19 | Discharge: 2015-05-19 | Disposition: A | Discharge status: Home / Self Care | Payer: Self-pay | Source: Ambulatory Visit | Attending: Cardiology | Admitting: Cardiology

## 2015-05-19 DIAGNOSIS — Z5189 Encounter for other specified aftercare: Secondary | ICD-10-CM | POA: Insufficient documentation

## 2015-05-19 DIAGNOSIS — Z951 Presence of aortocoronary bypass graft: Secondary | ICD-10-CM | POA: Insufficient documentation

## 2015-05-19 DIAGNOSIS — I2510 Atherosclerotic heart disease of native coronary artery without angina pectoris: Secondary | ICD-10-CM | POA: Insufficient documentation

## 2015-05-20 ENCOUNTER — Encounter (HOSPITAL_COMMUNITY): Payer: Self-pay

## 2015-05-21 ENCOUNTER — Encounter (HOSPITAL_COMMUNITY): Payer: Self-pay

## 2015-05-22 ENCOUNTER — Encounter (HOSPITAL_COMMUNITY)
Admission: RE | Admit: 2015-05-22 | Discharge: 2015-05-22 | Disposition: A | Discharge status: Home / Self Care | Payer: Self-pay | Source: Ambulatory Visit | Attending: Cardiology | Admitting: Cardiology

## 2015-05-26 ENCOUNTER — Encounter (HOSPITAL_COMMUNITY)
Admission: RE | Admit: 2015-05-26 | Discharge: 2015-05-26 | Disposition: A | Discharge status: Home / Self Care | Payer: Self-pay | Source: Ambulatory Visit | Attending: Cardiology | Admitting: Cardiology

## 2015-05-27 ENCOUNTER — Encounter (HOSPITAL_COMMUNITY): Payer: Self-pay

## 2015-05-28 ENCOUNTER — Encounter (HOSPITAL_COMMUNITY)
Admission: RE | Admit: 2015-05-28 | Discharge: 2015-05-28 | Disposition: A | Discharge status: Home / Self Care | Payer: Self-pay | Source: Ambulatory Visit | Attending: Cardiology | Admitting: Cardiology

## 2015-05-29 ENCOUNTER — Encounter (HOSPITAL_COMMUNITY)
Admission: RE | Admit: 2015-05-29 | Discharge: 2015-05-29 | Disposition: A | Discharge status: Home / Self Care | Payer: Self-pay | Source: Ambulatory Visit | Attending: Cardiology | Admitting: Cardiology

## 2015-06-02 ENCOUNTER — Encounter (HOSPITAL_COMMUNITY): Payer: Self-pay

## 2015-06-03 ENCOUNTER — Encounter (HOSPITAL_COMMUNITY): Payer: Self-pay

## 2015-06-04 ENCOUNTER — Encounter (HOSPITAL_COMMUNITY): Payer: Self-pay

## 2015-06-09 ENCOUNTER — Encounter (HOSPITAL_COMMUNITY): Payer: Self-pay

## 2015-06-10 ENCOUNTER — Encounter (HOSPITAL_COMMUNITY): Payer: Self-pay

## 2015-06-11 ENCOUNTER — Encounter (HOSPITAL_COMMUNITY)
Admission: RE | Admit: 2015-06-11 | Discharge: 2015-06-11 | Disposition: A | Discharge status: Home / Self Care | Payer: Self-pay | Source: Ambulatory Visit | Attending: Cardiology | Admitting: Cardiology

## 2015-06-16 ENCOUNTER — Encounter (HOSPITAL_COMMUNITY)
Admission: RE | Admit: 2015-06-16 | Discharge: 2015-06-16 | Disposition: A | Discharge status: Home / Self Care | Payer: Self-pay | Source: Ambulatory Visit | Attending: Cardiology | Admitting: Cardiology

## 2015-06-16 DIAGNOSIS — I2510 Atherosclerotic heart disease of native coronary artery without angina pectoris: Secondary | ICD-10-CM | POA: Insufficient documentation

## 2015-06-16 DIAGNOSIS — Z5189 Encounter for other specified aftercare: Secondary | ICD-10-CM | POA: Insufficient documentation

## 2015-06-16 DIAGNOSIS — Z951 Presence of aortocoronary bypass graft: Secondary | ICD-10-CM | POA: Insufficient documentation

## 2015-06-17 ENCOUNTER — Encounter (HOSPITAL_COMMUNITY): Payer: Self-pay

## 2015-06-18 ENCOUNTER — Other Ambulatory Visit: Payer: Self-pay | Admitting: Cardiology

## 2015-06-18 ENCOUNTER — Encounter (HOSPITAL_COMMUNITY)
Admission: RE | Admit: 2015-06-18 | Discharge: 2015-06-18 | Disposition: A | Discharge status: Home / Self Care | Payer: Self-pay | Source: Ambulatory Visit | Attending: Cardiology | Admitting: Cardiology

## 2015-06-23 ENCOUNTER — Encounter (HOSPITAL_COMMUNITY): Payer: Self-pay

## 2015-06-24 ENCOUNTER — Encounter (HOSPITAL_COMMUNITY): Payer: Self-pay

## 2015-06-25 ENCOUNTER — Encounter (HOSPITAL_COMMUNITY)
Admission: RE | Admit: 2015-06-25 | Discharge: 2015-06-25 | Disposition: A | Discharge status: Home / Self Care | Payer: Self-pay | Source: Ambulatory Visit | Attending: Cardiology | Admitting: Cardiology

## 2015-06-30 ENCOUNTER — Encounter (HOSPITAL_COMMUNITY)
Admission: RE | Admit: 2015-06-30 | Discharge: 2015-06-30 | Disposition: A | Discharge status: Home / Self Care | Payer: Self-pay | Source: Ambulatory Visit | Attending: Cardiology | Admitting: Cardiology

## 2015-07-01 ENCOUNTER — Encounter (HOSPITAL_COMMUNITY): Payer: Self-pay

## 2015-07-02 ENCOUNTER — Encounter (HOSPITAL_COMMUNITY)
Admission: RE | Admit: 2015-07-02 | Discharge: 2015-07-02 | Disposition: A | Discharge status: Home / Self Care | Payer: Self-pay | Source: Ambulatory Visit | Attending: Cardiology | Admitting: Cardiology

## 2015-07-03 ENCOUNTER — Encounter (HOSPITAL_COMMUNITY)
Admission: RE | Admit: 2015-07-03 | Discharge: 2015-07-03 | Disposition: A | Discharge status: Home / Self Care | Payer: Self-pay | Source: Ambulatory Visit | Attending: Cardiology | Admitting: Cardiology

## 2015-07-07 ENCOUNTER — Encounter (HOSPITAL_COMMUNITY): Payer: Self-pay

## 2015-07-08 ENCOUNTER — Encounter (HOSPITAL_COMMUNITY): Payer: Self-pay

## 2015-07-14 ENCOUNTER — Encounter (HOSPITAL_COMMUNITY)
Admission: RE | Admit: 2015-07-14 | Discharge: 2015-07-14 | Disposition: A | Discharge status: Home / Self Care | Payer: Self-pay | Source: Ambulatory Visit | Attending: Cardiology | Admitting: Cardiology

## 2015-07-15 ENCOUNTER — Encounter (HOSPITAL_COMMUNITY): Payer: Self-pay

## 2015-07-16 ENCOUNTER — Encounter (HOSPITAL_COMMUNITY)
Admission: RE | Admit: 2015-07-16 | Discharge: 2015-07-16 | Disposition: A | Discharge status: Home / Self Care | Payer: Self-pay | Source: Ambulatory Visit | Attending: Cardiology | Admitting: Cardiology

## 2015-07-16 DIAGNOSIS — Z951 Presence of aortocoronary bypass graft: Secondary | ICD-10-CM | POA: Insufficient documentation

## 2015-07-17 ENCOUNTER — Encounter (HOSPITAL_COMMUNITY)
Admission: RE | Admit: 2015-07-17 | Discharge: 2015-07-17 | Disposition: A | Discharge status: Home / Self Care | Payer: Self-pay | Source: Ambulatory Visit | Attending: Cardiology | Admitting: Cardiology

## 2015-07-21 ENCOUNTER — Encounter (HOSPITAL_COMMUNITY)
Admission: RE | Admit: 2015-07-21 | Discharge: 2015-07-21 | Disposition: A | Discharge status: Home / Self Care | Payer: Self-pay | Source: Ambulatory Visit | Attending: Cardiology | Admitting: Cardiology

## 2015-07-23 ENCOUNTER — Encounter (HOSPITAL_COMMUNITY): Payer: Self-pay

## 2015-07-24 ENCOUNTER — Encounter (HOSPITAL_COMMUNITY)
Admission: RE | Admit: 2015-07-24 | Discharge: 2015-07-24 | Disposition: A | Discharge status: Home / Self Care | Payer: Self-pay | Source: Ambulatory Visit | Attending: Cardiology | Admitting: Cardiology

## 2015-07-28 ENCOUNTER — Encounter (HOSPITAL_COMMUNITY)
Admission: RE | Admit: 2015-07-28 | Discharge: 2015-07-28 | Disposition: A | Discharge status: Home / Self Care | Payer: Self-pay | Source: Ambulatory Visit | Attending: Cardiology | Admitting: Cardiology

## 2015-07-30 ENCOUNTER — Encounter (HOSPITAL_COMMUNITY)
Admission: RE | Admit: 2015-07-30 | Discharge: 2015-07-30 | Disposition: A | Discharge status: Home / Self Care | Payer: Self-pay | Source: Ambulatory Visit | Attending: Cardiology | Admitting: Cardiology

## 2015-07-31 ENCOUNTER — Encounter (HOSPITAL_COMMUNITY)
Admission: RE | Admit: 2015-07-31 | Discharge: 2015-07-31 | Disposition: A | Discharge status: Home / Self Care | Payer: Self-pay | Source: Ambulatory Visit | Attending: Cardiology | Admitting: Cardiology

## 2015-07-31 ENCOUNTER — Other Ambulatory Visit: Payer: Self-pay | Admitting: Cardiology

## 2015-08-04 ENCOUNTER — Encounter (HOSPITAL_COMMUNITY)
Admission: RE | Admit: 2015-08-04 | Discharge: 2015-08-04 | Disposition: A | Discharge status: Home / Self Care | Payer: Self-pay | Source: Ambulatory Visit | Attending: Cardiology | Admitting: Cardiology

## 2015-08-06 ENCOUNTER — Encounter (HOSPITAL_COMMUNITY)
Admission: RE | Admit: 2015-08-06 | Discharge: 2015-08-06 | Disposition: A | Discharge status: Home / Self Care | Payer: Self-pay | Source: Ambulatory Visit | Attending: Cardiology | Admitting: Cardiology

## 2015-08-07 ENCOUNTER — Encounter (HOSPITAL_COMMUNITY)
Admission: RE | Admit: 2015-08-07 | Discharge: 2015-08-07 | Disposition: A | Discharge status: Home / Self Care | Payer: Self-pay | Source: Ambulatory Visit | Attending: Cardiology | Admitting: Cardiology

## 2015-08-11 ENCOUNTER — Encounter (HOSPITAL_COMMUNITY): Payer: Self-pay

## 2015-08-13 ENCOUNTER — Encounter (HOSPITAL_COMMUNITY): Payer: Self-pay

## 2015-08-14 ENCOUNTER — Encounter (HOSPITAL_COMMUNITY)
Admission: RE | Admit: 2015-08-14 | Discharge: 2015-08-14 | Disposition: A | Discharge status: Home / Self Care | Payer: Self-pay | Source: Ambulatory Visit | Attending: Cardiology | Admitting: Cardiology

## 2015-08-18 ENCOUNTER — Encounter (HOSPITAL_COMMUNITY)
Admission: RE | Admit: 2015-08-18 | Discharge: 2015-08-18 | Disposition: A | Discharge status: Home / Self Care | Payer: Self-pay | Source: Ambulatory Visit | Attending: Cardiology | Admitting: Cardiology

## 2015-08-18 DIAGNOSIS — Z951 Presence of aortocoronary bypass graft: Secondary | ICD-10-CM | POA: Insufficient documentation

## 2015-08-20 ENCOUNTER — Encounter (HOSPITAL_COMMUNITY)
Admission: RE | Admit: 2015-08-20 | Discharge: 2015-08-20 | Disposition: A | Discharge status: Home / Self Care | Payer: Self-pay | Source: Ambulatory Visit | Attending: Cardiology | Admitting: Cardiology

## 2015-08-21 ENCOUNTER — Encounter (HOSPITAL_COMMUNITY)
Admission: RE | Admit: 2015-08-21 | Discharge: 2015-08-21 | Disposition: A | Discharge status: Home / Self Care | Payer: Self-pay | Source: Ambulatory Visit | Attending: Cardiology | Admitting: Cardiology

## 2015-08-25 ENCOUNTER — Encounter (HOSPITAL_COMMUNITY)
Admission: RE | Admit: 2015-08-25 | Discharge: 2015-08-25 | Disposition: A | Discharge status: Home / Self Care | Payer: Self-pay | Source: Ambulatory Visit | Attending: Cardiology | Admitting: Cardiology

## 2015-08-27 ENCOUNTER — Encounter (HOSPITAL_COMMUNITY)
Admission: RE | Admit: 2015-08-27 | Discharge: 2015-08-27 | Disposition: A | Discharge status: Home / Self Care | Payer: Self-pay | Source: Ambulatory Visit | Attending: Cardiology | Admitting: Cardiology

## 2015-08-28 ENCOUNTER — Encounter (HOSPITAL_COMMUNITY)
Admission: RE | Admit: 2015-08-28 | Discharge: 2015-08-28 | Disposition: A | Discharge status: Home / Self Care | Payer: Self-pay | Source: Ambulatory Visit | Attending: Cardiology | Admitting: Cardiology

## 2015-09-01 ENCOUNTER — Encounter (HOSPITAL_COMMUNITY)
Admission: RE | Admit: 2015-09-01 | Discharge: 2015-09-01 | Disposition: A | Discharge status: Home / Self Care | Payer: Self-pay | Source: Ambulatory Visit | Attending: Cardiology | Admitting: Cardiology

## 2015-09-03 ENCOUNTER — Encounter (HOSPITAL_COMMUNITY)
Admission: RE | Admit: 2015-09-03 | Discharge: 2015-09-03 | Disposition: A | Discharge status: Home / Self Care | Payer: Self-pay | Source: Ambulatory Visit | Attending: Cardiology | Admitting: Cardiology

## 2015-09-04 ENCOUNTER — Encounter (HOSPITAL_COMMUNITY)
Admission: RE | Admit: 2015-09-04 | Discharge: 2015-09-04 | Disposition: A | Discharge status: Home / Self Care | Payer: Self-pay | Source: Ambulatory Visit | Attending: Cardiology | Admitting: Cardiology

## 2015-09-08 ENCOUNTER — Encounter (HOSPITAL_COMMUNITY)
Admission: RE | Admit: 2015-09-08 | Discharge: 2015-09-08 | Disposition: A | Discharge status: Home / Self Care | Payer: Self-pay | Source: Ambulatory Visit | Attending: Cardiology | Admitting: Cardiology

## 2015-09-10 ENCOUNTER — Encounter (HOSPITAL_COMMUNITY)
Admission: RE | Admit: 2015-09-10 | Discharge: 2015-09-10 | Disposition: A | Discharge status: Home / Self Care | Payer: Self-pay | Source: Ambulatory Visit | Attending: Cardiology | Admitting: Cardiology

## 2015-09-11 ENCOUNTER — Encounter (HOSPITAL_COMMUNITY)
Admission: RE | Admit: 2015-09-11 | Discharge: 2015-09-11 | Disposition: A | Discharge status: Home / Self Care | Payer: Self-pay | Source: Ambulatory Visit | Attending: Cardiology | Admitting: Cardiology

## 2015-09-13 ENCOUNTER — Other Ambulatory Visit: Payer: Self-pay | Admitting: Cardiology

## 2015-09-15 ENCOUNTER — Encounter (HOSPITAL_COMMUNITY)
Admission: RE | Admit: 2015-09-15 | Discharge: 2015-09-15 | Disposition: A | Discharge status: Home / Self Care | Payer: Self-pay | Source: Ambulatory Visit | Attending: Cardiology | Admitting: Cardiology

## 2015-09-17 ENCOUNTER — Encounter (HOSPITAL_COMMUNITY)
Admission: RE | Admit: 2015-09-17 | Discharge: 2015-09-17 | Disposition: A | Discharge status: Home / Self Care | Payer: Self-pay | Source: Ambulatory Visit | Attending: Cardiology | Admitting: Cardiology

## 2015-09-17 DIAGNOSIS — Z951 Presence of aortocoronary bypass graft: Secondary | ICD-10-CM | POA: Insufficient documentation

## 2015-09-18 ENCOUNTER — Encounter (HOSPITAL_COMMUNITY)
Admission: RE | Admit: 2015-09-18 | Discharge: 2015-09-18 | Disposition: A | Discharge status: Home / Self Care | Payer: Self-pay | Source: Ambulatory Visit | Attending: Cardiology | Admitting: Cardiology

## 2015-09-22 ENCOUNTER — Encounter (HOSPITAL_COMMUNITY)
Admission: RE | Admit: 2015-09-22 | Discharge: 2015-09-22 | Disposition: A | Discharge status: Home / Self Care | Payer: Self-pay | Source: Ambulatory Visit | Attending: Cardiology | Admitting: Cardiology

## 2015-09-24 ENCOUNTER — Encounter (HOSPITAL_COMMUNITY): Payer: Self-pay

## 2015-09-25 ENCOUNTER — Encounter (HOSPITAL_COMMUNITY): Payer: Self-pay

## 2015-09-29 ENCOUNTER — Encounter (HOSPITAL_COMMUNITY)
Admission: RE | Admit: 2015-09-29 | Discharge: 2015-09-29 | Disposition: A | Discharge status: Home / Self Care | Payer: Self-pay | Source: Ambulatory Visit | Attending: Cardiology | Admitting: Cardiology

## 2015-10-01 ENCOUNTER — Encounter (HOSPITAL_COMMUNITY)
Admission: RE | Admit: 2015-10-01 | Discharge: 2015-10-01 | Disposition: A | Discharge status: Home / Self Care | Payer: Self-pay | Source: Ambulatory Visit | Attending: Cardiology | Admitting: Cardiology

## 2015-10-02 ENCOUNTER — Encounter (HOSPITAL_COMMUNITY)
Admission: RE | Admit: 2015-10-02 | Discharge: 2015-10-02 | Disposition: A | Discharge status: Home / Self Care | Payer: Self-pay | Source: Ambulatory Visit | Attending: Cardiology | Admitting: Cardiology

## 2015-10-05 NOTE — Progress Notes (Signed)
Cardiology Office Note:    Date:  10/06/2015   ID:  [name], DOB 08-27-1950, MRN [id number]  PCP:  [name], MD  Cardiologist:  Dr. Fransico Him   Electrophysiologist:  n/a  Chief Complaint  Patient presents with  . Coronary Artery Disease    Follow up    History of Present Illness:     [name] is a 65 y.o. male with a hx of CAD, HL, HTN, prior CVA, diabetes, aortic stenosis. Patient suffered a non-STEMI in 2013 treated with PCI of the LCx/OM. LHC in 5/14 demonstrated severe 2 vessel CAD with critical in-stent restenosis in the LCx. He underwent CABG (SVG-PDA, SVG-LCx). Echo in 11/15 with normal LV function and mild aortic stenosis with a mean gradient of 11 mmHg and mild to moderate AI. Last seen by Dr. Radford Pax 11/15.   Returns for follow-up. He continues to attend cardiac rehabilitation 3 times a week. He is doing well without chest discomfort. He does note dyspnea over the past year. His shortness of breath is not getting any worse. He only notices dyspnea after sexual intercourse. He denies dyspnea with exercise. He denies orthopnea, PND or edema. Denies syncope.    Past Medical History  Diagnosis Date  . Erectile dysfunction   . Dyslipidemia   . Chronic kidney disease     kidney stones s/p lithotripsy  . Cancer [facility])     mediastinal seminoma resected 1985 - benign  . Chest pain   . Shortness of breath   . GERD (gastroesophageal reflux disease)   . NSTEMI (non-ST elevated myocardial infarction) ([place])   . Insomnia   . TIA (transient ischemic attack)   . AAA (abdominal aortic aneurysm) (HCC)     (-)11/11,CT abdomen-no aneurysm aortic  . Diabetes mellitus     neuropathy  insulin dependent  . Insomnia   . Erectile dysfunction   . Hypertension   . Elevated cholesterol   . Stroke ([place])     TIA's  . Coronary artery disease 02/2012    60% mid RCA, 80% prox left circ, 99% distal left circ, normal LAD s/p PCI left circ/OM, 02/2012 -now s/p CABG  . Aortic  stenosis, mild 06/27/2014    Past Surgical History  Procedure Laterality Date  . Resection mediastinal seminonma    . Lithotripsy    . Esophagogastroduodenoscopy  07/20/2012    Procedure: ESOPHAGOGASTRODUODENOSCOPY (EGD);  Surgeon: Winfield Cunas., MD;  Location: [facility] ENDOSCOPY;  Service: Endoscopy;  Laterality: N/A;  . Colonoscopy  07/25/2012    Procedure: COLONOSCOPY;  Surgeon: Winfield Cunas., MD;  Location: [facility] ENDOSCOPY;  Service: Endoscopy;  Laterality: N/A;  . Coronary artery bypass graft N/A 01/04/2013    Procedure: CORONARY ARTERY BYPASS GRAFTING (CABG) times two using right saphenous vein harvested with endoscope.;  Surgeon: Ivin Poot, MD;  Location: [place];  Service: Open Heart Surgery;  Laterality: N/A;  . Intraoperative transesophageal echocardiogram N/A 01/04/2013    Procedure: INTRAOPERATIVE TRANSESOPHAGEAL ECHOCARDIOGRAM;  Surgeon: Ivin Poot, MD;  Location: [place];  Service: Open Heart Surgery;  Laterality: N/A;  . Radial artery harvest Left 01/04/2013    Procedure: RADIAL ARTERY HARVEST;  Surgeon: Ivin Poot, MD;  Location: [place];  Service: Vascular;  Laterality: Left;  Artery not havested. Unsuitable for use.  . Esophagogastroduodenoscopy N/A 12/02/2013    Procedure: ESOPHAGOGASTRODUODENOSCOPY (EGD);  Surgeon: Winfield Cunas., MD;  Location: [facility] ENDOSCOPY;  Service: Endoscopy;  Laterality: N/A;  . Colonoscopy N/A  12/02/2013    Procedure: COLONOSCOPY;  Surgeon: Winfield Cunas., MD;  Location: [facility] ENDOSCOPY;  Service: Endoscopy;  Laterality: N/A;  . Hot hemostasis N/A 12/02/2013    Procedure: HOT HEMOSTASIS (ARGON PLASMA COAGULATION/BICAP);  Surgeon: Winfield Cunas., MD;  Location: [facility] ENDOSCOPY;  Service: Endoscopy;  Laterality: N/A;  . Left heart catheterization with coronary angiogram N/A 03/06/2012    Procedure: LEFT HEART CATHETERIZATION WITH CORONARY ANGIOGRAM;  Surgeon: [name], MD;  Location: [place] CATH LAB;  Service: Cardiovascular;   Laterality: N/A;    Current Medications: Outpatient Prescriptions Prior to Visit  Medication Sig Dispense Refill  . amitriptyline (ELAVIL) 100 MG tablet Take 100 mg by mouth at bedtime.    Marland Kitchen aspirin 81 MG EC tablet Take 1 tablet (81 mg total) by mouth daily.    . B-D ULTRAFINE III SHORT PEN 31G X 8 MM MISC 1 each by Other route daily as needed (GLUCOSE TESTING (INSULINE NEEDLE)).     . insulin aspart (NOVOLOG) 100 UNIT/ML injection Inject 6-14 Units into the skin 3 (three) times daily before meals. Patient's dose is done on a sliding scale. Usually takes 8 units, but dose varies depending on glucose [name].    . iron polysaccharides (NIFEREX) 150 MG capsule Take 150 mg by mouth daily.     . metFORMIN (GLUCOPHAGE) 500 MG tablet Take 1,000 mg by mouth 2 (two) times daily with a meal.    . Multiple Vitamin (MULTIVITAMIN) tablet Take 1 tablet by mouth daily.    . Omega-3 Fatty Acids (FISH OIL MAXIMUM STRENGTH) 1200 MG CAPS Take 3,000 mg by mouth 2 (two) times daily.     . ONE TOUCH ULTRA TEST test strip 1 each by Other route as needed for other.     . simvastatin (ZOCOR) 40 MG tablet Take 40 mg by mouth every evening.    . zolpidem (AMBIEN) 10 MG tablet Take 10 mg by mouth at bedtime as needed for sleep.     . metoprolol succinate (TOPROL-XL) 50 MG 24 hr tablet TAKE 1 TABLET BY MOUTH TWICE DAILY WITH OR IMMEDIATELY FOLLOWING A MEAL 60 tablet 0  . insulin glargine (LANTUS) 100 UNIT/ML injection Inject 40 Units into the skin at bedtime. Reported on 10/06/2015     No facility-administered medications prior to visit.     Allergies:   Lipitor and Adhesive   Social History   Social History  . Marital Status: Married    Spouse Name: N/A  . Number of Children: N/A  . Years of Education: N/A   Social History Main Topics  . Smoking status: Former Smoker    Quit date: 03/06/1985  . Smokeless tobacco: Never Used  . Alcohol Use: No  . Drug Use: No  . Sexual Activity: Not Currently   Other  Topics Concern  . None   Social History Narrative     Family History:  The patient's family history includes Cancer in his brother; Diabetes in his father, mother, and sister; Heart failure in his mother; Hypertension in his mother.   ROS:   Please see the history of present illness.    Review of Systems  Cardiovascular: Positive for dyspnea on exertion.  All other systems reviewed and are negative.   Physical Exam:    VS:  BP 100/62 mmHg  Pulse 102  Ht '5\' 11"'$  (1.803 m)  Wt 189 lb 9.6 oz (86.002 kg)  BMI 26.46 kg/m2  SpO2 96%   GEN: Well nourished, well  developed, in no acute distress HEENT: normal Neck: no JVD, no masses Cardiac: Normal S1/S2, RRR; harsh crescendo decrescendo 2/6 systolic murmur RUSB, no edema   Respiratory:  clear to auscultation bilaterally; no wheezing, rhonchi or rales GI: soft, nontender MS: no deformity or atrophy Skin: warm and dry Neuro:  no focal deficits  Psych: Alert and oriented x 3, normal affect  Wt Readings from Last 3 Encounters:  10/06/15 189 lb 9.6 oz (86.002 kg)  06/26/14 192 lb 12 oz (87.431 kg)  06/17/14 191 lb 9.6 oz (86.909 kg)      Studies/Labs Reviewed:     EKG:  EKG is  ordered today.  The ekg ordered today demonstrates sinus tachycardia, HR 104, normal axis, first-degree AV block, PR interval 210 ms, QTC 447 ms, no change from prior tracing  Recent Labs: No results found for requested labs within last 365 days.   Recent Lipid Panel    Component Value Date/Time   CHOL 104 02/11/2014 0741   TRIG 71.0 02/11/2014 0741   HDL 35.30* 02/11/2014 0741   CHOLHDL 3 02/11/2014 0741   VLDL 14.2 02/11/2014 0741   LDLCALC 55 02/11/2014 0741    Additional studies/ records that were reviewed today include:   Echo 11/15 Mild LVH, EF 60-65%, normal wall motion, mild aortic stenosis (mean 11 mmHg), mild to moderate AI, MAC, mild LAE  LHC 5/14 LM luminal irregularities LAD luminal irregularities LCx proximal 90% ISR RCA  ostial 99%, mid 60% EF 55%   Carotid US 5/14 No significant ICA stenosis.  Myoview 3/14 Normal perfusion, EF 57%   ASSESSMENT:     1. Shortness of breath   2. Coronary artery disease involving native coronary artery of native heart without angina pectoris   3. Aortic stenosis, mild   4. Essential hypertension   5. Dyslipidemia   6. Sinus tachycardia ([name])     PLAN:     In order of problems listed above:  1. Dyspnea - Etiology unclear. He only notices symptoms after sexual intercourse. He goes to cardiac rehabilitation 3 times a week without symptoms of dyspnea. He does not have any evidence of volume excess on exam. He is overdue for follow-up echocardiogram to reassess his aortic stenosis. It has been 3 years since his bypass surgery. He does feel that the beta blocker makes him excessively tired.  -  Arrange follow-up echocardiogram  -  Arrange plain exercise treadmill test  -  Decrease Toprol to 50 mg daily  -  Follow-up sooner if symptoms worsen.  2. CAD - Hx of PCI to the LCx in the setting of NSTEMI in 2013 and eventual CABG in 2014.  As noted, he has noted some dyspnea after intercourse over the past year without significant change. Exercise treadmill test will be arranged to rule out ischemia. Continue aspirin, beta blocker, statin.  3. Aortic stenosis - Mild AS and mild to mod AI by echo in 11/15.  Arrange follow-up echocardiogram.  4. HTN - Blood pressure controlled. As noted, he does note fatigue with Toprol 50 mg twice a day. I have asked him to decrease this to once daily. I have asked him to monitor his blood pressure and heart rate at cardiac rehabilitation over the next 2 weeks and send me a copy of the readings. If his heart rate and blood pressure remain well controlled, he may continue on Toprol-XL 50 mg daily.  5. HL - Managed by primary care. Obtain recent lipid panel from PCP. Continue statin.  6. Sinus tachycardia - In review of his chart, he is  chronically tachycardic. His heart rates typically remain stable at cardiac rehabilitation, per his report. As noted, he would like to decrease the dose of his beta blocker. His Toprol will be reduced to once a day. If his heart rate becomes significantly uncontrolled on lower dose Toprol, we will need to resume Toprol 50 mg twice a day. Obtain recent BMET, TSH, CBC from primary care. As noted, follow-up echo will be obtained as well.     Medication Adjustments/Labs and Tests Ordered: Current medicines are reviewed at length with the patient today.  Concerns regarding medicines are outlined above.  Medication changes, Labs and Tests ordered today are outlined in the Patient Instructions noted below. Patient Instructions  Medication Instructions:  1. DECREASE TOPROL XL 50 MG DAILY  Labwork: I WILL GET YOUR RECENT LABS WORK FROM PRIMARY CARE  Testing/Procedures: 1. Your physician has requested that you have an echocardiogram THIS IS TO BE DONE FIRST BEFORE THE STRESS TEST. Echocardiography is a painless test that uses sound waves to create images of your heart. It provides your doctor with information about the size and shape of your heart and how well your heart's chambers and valves are working. This procedure takes approximately one hour. There are no restrictions for this procedure.  2. Your physician has requested that you have an exercise tolerance test. THE STRESS TEST IS TO BE DONE A COUPLE OF DAYS AFTER THE ECHO HAS BEEN DONE. For further information please visit [email or web address]. Please also follow instruction sheet, as given.  Follow-Up: Your physician wants you to follow-up in: [place]. You will receive a reminder letter in the mail two months in advance. If you don't receive a letter, please call our office to schedule the follow-up appointment.  Any Other Special Instructions Will Be Listed Below (If Applicable). CHECK YOUR BP AND HEART RATE AT [facility] BEFORE  STARTING REHAB AND SEND READINGS TO [place], [place] AFTER 2 WEEKS OF READINGS  If you need a refill on your cardiac medications before your next appointment, please call your pharmacy.   Signed, Richardson Dopp, PA-C  10/06/2015 10:53 AM    [facility] [place], [place], [place]  [address] Phone: [phone]; Fax: [phone]

## 2015-10-06 ENCOUNTER — Encounter: Payer: Self-pay | Admitting: Physician Assistant

## 2015-10-06 ENCOUNTER — Encounter (HOSPITAL_COMMUNITY)
Admission: RE | Admit: 2015-10-06 | Discharge: 2015-10-06 | Disposition: A | Discharge status: Home / Self Care | Payer: Self-pay | Source: Ambulatory Visit | Attending: Cardiology | Admitting: Cardiology

## 2015-10-06 ENCOUNTER — Ambulatory Visit (INDEPENDENT_AMBULATORY_CARE_PROVIDER_SITE_OTHER): Payer: 59 | Admitting: Physician Assistant

## 2015-10-06 VITALS — BP 100/62 | HR 102 | Ht 71.0 in | Wt 189.6 lb

## 2015-10-06 DIAGNOSIS — I350 Nonrheumatic aortic (valve) stenosis: Secondary | ICD-10-CM

## 2015-10-06 DIAGNOSIS — R0602 Shortness of breath: Secondary | ICD-10-CM | POA: Diagnosis not present

## 2015-10-06 DIAGNOSIS — I2510 Atherosclerotic heart disease of native coronary artery without angina pectoris: Secondary | ICD-10-CM

## 2015-10-06 DIAGNOSIS — E785 Hyperlipidemia, unspecified: Secondary | ICD-10-CM | POA: Diagnosis not present

## 2015-10-06 DIAGNOSIS — R000 Tachycardia, unspecified: Secondary | ICD-10-CM

## 2015-10-06 DIAGNOSIS — I10 Essential (primary) hypertension: Secondary | ICD-10-CM | POA: Diagnosis not present

## 2015-10-06 MED ORDER — METOPROLOL SUCCINATE ER 50 MG PO TB24: 50.0000 mg | ORAL_TABLET | Freq: Every day | ORAL | Status: DC

## 2015-10-06 NOTE — Patient Instructions (Addendum)
Medication Instructions:  1. DECREASE TOPROL XL 50 MG DAILY  Labwork: I WILL GET YOUR RECENT LABS WORK FROM PRIMARY CARE  Testing/Procedures: 1. Your physician has requested that you have an echocardiogram THIS IS TO BE DONE FIRST BEFORE THE STRESS TEST. Echocardiography is a painless test that uses sound waves to create images of your heart. It provides your doctor with information about the size and shape of your heart and how well your heart's chambers and valves are working. This procedure takes approximately one hour. There are no restrictions for this procedure.  2. Your physician has requested that you have an exercise tolerance test. THE STRESS TEST IS TO BE DONE A COUPLE OF DAYS AFTER THE ECHO HAS BEEN DONE. For further information please visit [email or web address]. Please also follow instruction sheet, as given.  Follow-Up: Your physician wants you to follow-up in: [place]. You will receive a reminder letter in the mail two months in advance. If you don't receive a letter, please call our office to schedule the follow-up appointment.  Any Other Special Instructions Will Be Listed Below (If Applicable). CHECK YOUR BP AND HEART RATE AT [facility] BEFORE STARTING REHAB AND SEND READINGS TO [place], [place] AFTER 2 WEEKS OF READINGS  If you need a refill on your cardiac medications before your next appointment, please call your pharmacy.

## 2015-10-07 ENCOUNTER — Encounter: Payer: Self-pay | Admitting: Physician Assistant

## 2015-10-08 ENCOUNTER — Telehealth: Payer: Self-pay | Admitting: *Deleted

## 2015-10-08 ENCOUNTER — Encounter (HOSPITAL_COMMUNITY)
Admission: RE | Admit: 2015-10-08 | Discharge: 2015-10-08 | Disposition: A | Discharge status: Home / Self Care | Payer: Self-pay | Source: Ambulatory Visit | Attending: Cardiology | Admitting: Cardiology

## 2015-10-08 NOTE — Telephone Encounter (Signed)
Lmptcb to go over lab results 

## 2015-10-09 ENCOUNTER — Encounter (HOSPITAL_COMMUNITY): Payer: Self-pay

## 2015-10-09 NOTE — Telephone Encounter (Signed)
Pt and wife notified of lab results by phone with verbal understanding

## 2015-10-12 ENCOUNTER — Ambulatory Visit: Payer: Medicare Other | Admitting: Physician Assistant

## 2015-10-13 ENCOUNTER — Encounter (HOSPITAL_COMMUNITY)
Admission: RE | Admit: 2015-10-13 | Discharge: 2015-10-13 | Disposition: A | Discharge status: Home / Self Care | Payer: Self-pay | Source: Ambulatory Visit | Attending: Cardiology | Admitting: Cardiology

## 2015-10-15 ENCOUNTER — Encounter (HOSPITAL_COMMUNITY)
Admission: RE | Admit: 2015-10-15 | Discharge: 2015-10-15 | Disposition: A | Discharge status: Home / Self Care | Payer: Self-pay | Source: Ambulatory Visit | Attending: Cardiology | Admitting: Cardiology

## 2015-10-15 DIAGNOSIS — Z951 Presence of aortocoronary bypass graft: Secondary | ICD-10-CM | POA: Insufficient documentation

## 2015-10-16 ENCOUNTER — Encounter (HOSPITAL_COMMUNITY)
Admission: RE | Admit: 2015-10-16 | Discharge: 2015-10-16 | Disposition: A | Discharge status: Home / Self Care | Payer: Self-pay | Source: Ambulatory Visit | Attending: Cardiology | Admitting: Cardiology

## 2015-10-19 ENCOUNTER — Ambulatory Visit (HOSPITAL_COMMUNITY): Payer: 59 | Attending: Cardiovascular Disease

## 2015-10-19 ENCOUNTER — Encounter: Payer: Self-pay | Admitting: Physician Assistant

## 2015-10-19 ENCOUNTER — Other Ambulatory Visit: Payer: Self-pay

## 2015-10-19 ENCOUNTER — Encounter (HOSPITAL_COMMUNITY)
Admission: RE | Admit: 2015-10-19 | Discharge: 2015-10-19 | Disposition: A | Discharge status: Home / Self Care | Payer: Self-pay | Source: Ambulatory Visit | Attending: Cardiology | Admitting: Cardiology

## 2015-10-19 DIAGNOSIS — E785 Hyperlipidemia, unspecified: Secondary | ICD-10-CM | POA: Insufficient documentation

## 2015-10-19 DIAGNOSIS — Z951 Presence of aortocoronary bypass graft: Secondary | ICD-10-CM | POA: Insufficient documentation

## 2015-10-19 DIAGNOSIS — I350 Nonrheumatic aortic (valve) stenosis: Secondary | ICD-10-CM

## 2015-10-19 DIAGNOSIS — I119 Hypertensive heart disease without heart failure: Secondary | ICD-10-CM | POA: Diagnosis not present

## 2015-10-19 DIAGNOSIS — I340 Nonrheumatic mitral (valve) insufficiency: Secondary | ICD-10-CM | POA: Diagnosis not present

## 2015-10-19 DIAGNOSIS — E119 Type 2 diabetes mellitus without complications: Secondary | ICD-10-CM | POA: Insufficient documentation

## 2015-10-19 DIAGNOSIS — I352 Nonrheumatic aortic (valve) stenosis with insufficiency: Secondary | ICD-10-CM | POA: Insufficient documentation

## 2015-10-20 ENCOUNTER — Encounter (HOSPITAL_COMMUNITY): Admission: RE | Admit: 2015-10-20 | Payer: Self-pay | Source: Ambulatory Visit

## 2015-10-20 ENCOUNTER — Telehealth: Payer: Self-pay | Admitting: *Deleted

## 2015-10-20 NOTE — Telephone Encounter (Signed)
Pt notified of echo results and findings by phone with verbal understanding. pt asked for a copy fo his results to pick up 3/8 when he comes in for his stress test.

## 2015-10-21 ENCOUNTER — Telehealth: Payer: Self-pay | Admitting: *Deleted

## 2015-10-21 ENCOUNTER — Ambulatory Visit (INDEPENDENT_AMBULATORY_CARE_PROVIDER_SITE_OTHER): Payer: 59

## 2015-10-21 ENCOUNTER — Encounter (HOSPITAL_COMMUNITY): Payer: 59

## 2015-10-21 ENCOUNTER — Encounter: Payer: Self-pay | Admitting: Physician Assistant

## 2015-10-21 DIAGNOSIS — I10 Essential (primary) hypertension: Secondary | ICD-10-CM

## 2015-10-21 DIAGNOSIS — R0602 Shortness of breath: Secondary | ICD-10-CM | POA: Diagnosis not present

## 2015-10-21 LAB — EXERCISE TOLERANCE TEST
CHL CUP MPHR: 154 {beats}/min
CHL CUP RESTING HR STRESS: 100 {beats}/min
CSEPPHR: 134 {beats}/min
Estimated workload: 13.4 METS
Exercise duration (min): 11 min
Exercise duration (sec): 25 s
Percent HR: 87 %
RPE: 17

## 2015-10-21 MED ORDER — METOPROLOL SUCCINATE ER 50 MG PO TB24: 50.0000 mg | ORAL_TABLET | Freq: Every day | ORAL | Status: DC

## 2015-10-21 MED ORDER — METOPROLOL SUCCINATE ER 25 MG PO TB24: 25.0000 mg | ORAL_TABLET | Freq: Every day | ORAL | Status: DC

## 2015-10-21 NOTE — Telephone Encounter (Signed)
Pt has been notified of GXT results normal. I asked pt if he took Toprol this AM due to BP was high during the exercise on GXT. Pt states took Toprol around 10 pm last night. Advised pt per Brynda Rim. PA to increase Toprol XL to 75 mg daily. Pt advised to call with readings in about 2 weeks. Pt agreeable to plan of care.

## 2015-10-22 ENCOUNTER — Encounter (HOSPITAL_COMMUNITY)
Admission: RE | Admit: 2015-10-22 | Discharge: 2015-10-22 | Disposition: A | Discharge status: Home / Self Care | Payer: Self-pay | Source: Ambulatory Visit | Attending: Cardiology | Admitting: Cardiology

## 2015-10-23 ENCOUNTER — Encounter (HOSPITAL_COMMUNITY)
Admission: RE | Admit: 2015-10-23 | Discharge: 2015-10-23 | Disposition: A | Discharge status: Home / Self Care | Payer: Self-pay | Source: Ambulatory Visit | Attending: Cardiology | Admitting: Cardiology

## 2015-10-27 ENCOUNTER — Encounter (HOSPITAL_COMMUNITY): Payer: Self-pay

## 2015-10-29 ENCOUNTER — Encounter (HOSPITAL_COMMUNITY)
Admission: RE | Admit: 2015-10-29 | Discharge: 2015-10-29 | Disposition: A | Discharge status: Home / Self Care | Payer: Self-pay | Source: Ambulatory Visit | Attending: Cardiology | Admitting: Cardiology

## 2015-10-30 ENCOUNTER — Encounter (HOSPITAL_COMMUNITY)
Admission: RE | Admit: 2015-10-30 | Discharge: 2015-10-30 | Disposition: A | Discharge status: Home / Self Care | Payer: Self-pay | Source: Ambulatory Visit | Attending: Cardiology | Admitting: Cardiology

## 2015-11-03 ENCOUNTER — Encounter (HOSPITAL_COMMUNITY)
Admission: RE | Admit: 2015-11-03 | Discharge: 2015-11-03 | Disposition: A | Discharge status: Home / Self Care | Payer: Self-pay | Source: Ambulatory Visit | Attending: Cardiology | Admitting: Cardiology

## 2015-11-05 ENCOUNTER — Encounter (HOSPITAL_COMMUNITY)
Admission: RE | Admit: 2015-11-05 | Discharge: 2015-11-05 | Disposition: A | Discharge status: Home / Self Care | Payer: Self-pay | Source: Ambulatory Visit | Attending: Cardiology | Admitting: Cardiology

## 2015-11-06 ENCOUNTER — Encounter (HOSPITAL_COMMUNITY)
Admission: RE | Admit: 2015-11-06 | Discharge: 2015-11-06 | Disposition: A | Discharge status: Home / Self Care | Payer: Self-pay | Source: Ambulatory Visit | Attending: Cardiology | Admitting: Cardiology

## 2015-11-10 ENCOUNTER — Encounter (HOSPITAL_COMMUNITY)
Admission: RE | Admit: 2015-11-10 | Discharge: 2015-11-10 | Disposition: A | Discharge status: Home / Self Care | Payer: Self-pay | Source: Ambulatory Visit | Attending: Cardiology | Admitting: Cardiology

## 2015-11-12 ENCOUNTER — Encounter (HOSPITAL_COMMUNITY)
Admission: RE | Admit: 2015-11-12 | Discharge: 2015-11-12 | Disposition: A | Discharge status: Home / Self Care | Payer: Self-pay | Source: Ambulatory Visit | Attending: Cardiology | Admitting: Cardiology

## 2015-11-13 ENCOUNTER — Encounter (HOSPITAL_COMMUNITY)
Admission: RE | Admit: 2015-11-13 | Discharge: 2015-11-13 | Disposition: A | Discharge status: Home / Self Care | Payer: Self-pay | Source: Ambulatory Visit | Attending: Cardiology | Admitting: Cardiology

## 2015-11-17 ENCOUNTER — Encounter (HOSPITAL_COMMUNITY)
Admission: RE | Admit: 2015-11-17 | Discharge: 2015-11-17 | Disposition: A | Discharge status: Home / Self Care | Payer: Self-pay | Source: Ambulatory Visit | Attending: Cardiology | Admitting: Cardiology

## 2015-11-17 DIAGNOSIS — Z951 Presence of aortocoronary bypass graft: Secondary | ICD-10-CM | POA: Insufficient documentation

## 2015-11-19 ENCOUNTER — Encounter (HOSPITAL_COMMUNITY)
Admission: RE | Admit: 2015-11-19 | Discharge: 2015-11-19 | Disposition: A | Discharge status: Home / Self Care | Payer: Self-pay | Source: Ambulatory Visit | Attending: Cardiology | Admitting: Cardiology

## 2015-11-20 ENCOUNTER — Encounter (HOSPITAL_COMMUNITY)
Admission: RE | Admit: 2015-11-20 | Discharge: 2015-11-20 | Disposition: A | Discharge status: Home / Self Care | Payer: Self-pay | Source: Ambulatory Visit | Attending: Cardiology | Admitting: Cardiology

## 2015-11-24 ENCOUNTER — Encounter (HOSPITAL_COMMUNITY)
Admission: RE | Admit: 2015-11-24 | Discharge: 2015-11-24 | Disposition: A | Discharge status: Home / Self Care | Payer: Self-pay | Source: Ambulatory Visit | Attending: Cardiology | Admitting: Cardiology

## 2015-11-26 ENCOUNTER — Encounter (HOSPITAL_COMMUNITY)
Admission: RE | Admit: 2015-11-26 | Discharge: 2015-11-26 | Disposition: A | Discharge status: Home / Self Care | Payer: Self-pay | Source: Ambulatory Visit | Attending: Cardiology | Admitting: Cardiology

## 2015-11-27 ENCOUNTER — Encounter (HOSPITAL_COMMUNITY)
Admission: RE | Admit: 2015-11-27 | Discharge: 2015-11-27 | Disposition: A | Discharge status: Home / Self Care | Payer: Self-pay | Source: Ambulatory Visit | Attending: Cardiology | Admitting: Cardiology

## 2015-11-27 ENCOUNTER — Other Ambulatory Visit: Payer: Self-pay

## 2015-11-27 ENCOUNTER — Other Ambulatory Visit: Payer: Self-pay | Admitting: Cardiology

## 2015-11-27 MED ORDER — METOPROLOL SUCCINATE ER 50 MG PO TB24: 50.0000 mg | ORAL_TABLET | Freq: Every day | ORAL | Status: DC

## 2015-12-01 ENCOUNTER — Encounter (HOSPITAL_COMMUNITY)
Admission: RE | Admit: 2015-12-01 | Discharge: 2015-12-01 | Disposition: A | Discharge status: Home / Self Care | Payer: Self-pay | Source: Ambulatory Visit | Attending: Cardiology | Admitting: Cardiology

## 2015-12-03 ENCOUNTER — Encounter (HOSPITAL_COMMUNITY)
Admission: RE | Admit: 2015-12-03 | Discharge: 2015-12-03 | Disposition: A | Discharge status: Home / Self Care | Payer: Self-pay | Source: Ambulatory Visit | Attending: Cardiology | Admitting: Cardiology

## 2015-12-04 ENCOUNTER — Encounter (HOSPITAL_COMMUNITY): Payer: Self-pay

## 2015-12-08 ENCOUNTER — Encounter (HOSPITAL_COMMUNITY)
Admission: RE | Admit: 2015-12-08 | Discharge: 2015-12-08 | Disposition: A | Discharge status: Home / Self Care | Payer: Self-pay | Source: Ambulatory Visit | Attending: Cardiology | Admitting: Cardiology

## 2015-12-10 ENCOUNTER — Encounter (HOSPITAL_COMMUNITY)
Admission: RE | Admit: 2015-12-10 | Discharge: 2015-12-10 | Disposition: A | Discharge status: Home / Self Care | Payer: Self-pay | Source: Ambulatory Visit | Attending: Cardiology | Admitting: Cardiology

## 2015-12-11 ENCOUNTER — Encounter (HOSPITAL_COMMUNITY)
Admission: RE | Admit: 2015-12-11 | Discharge: 2015-12-11 | Disposition: A | Discharge status: Home / Self Care | Payer: 59 | Source: Ambulatory Visit | Attending: Cardiology | Admitting: Cardiology

## 2015-12-15 ENCOUNTER — Encounter (HOSPITAL_COMMUNITY)
Admission: RE | Admit: 2015-12-15 | Discharge: 2015-12-15 | Disposition: A | Discharge status: Home / Self Care | Payer: Self-pay | Source: Ambulatory Visit | Attending: Cardiology | Admitting: Cardiology

## 2015-12-15 DIAGNOSIS — Z951 Presence of aortocoronary bypass graft: Secondary | ICD-10-CM | POA: Insufficient documentation

## 2015-12-17 ENCOUNTER — Encounter (HOSPITAL_COMMUNITY)
Admission: RE | Admit: 2015-12-17 | Discharge: 2015-12-17 | Disposition: A | Discharge status: Home / Self Care | Payer: Self-pay | Source: Ambulatory Visit | Attending: Cardiology | Admitting: Cardiology

## 2015-12-18 ENCOUNTER — Encounter (HOSPITAL_COMMUNITY)
Admission: RE | Admit: 2015-12-18 | Discharge: 2015-12-18 | Disposition: A | Discharge status: Home / Self Care | Payer: Self-pay | Source: Ambulatory Visit | Attending: Cardiology | Admitting: Cardiology

## 2015-12-22 ENCOUNTER — Encounter (HOSPITAL_COMMUNITY)
Admission: RE | Admit: 2015-12-22 | Discharge: 2015-12-22 | Disposition: A | Discharge status: Home / Self Care | Payer: Self-pay | Source: Ambulatory Visit | Attending: Cardiology | Admitting: Cardiology

## 2015-12-24 ENCOUNTER — Encounter (HOSPITAL_COMMUNITY)
Admission: RE | Admit: 2015-12-24 | Discharge: 2015-12-24 | Disposition: A | Discharge status: Home / Self Care | Payer: Self-pay | Source: Ambulatory Visit | Attending: Cardiology | Admitting: Cardiology

## 2015-12-25 ENCOUNTER — Encounter (HOSPITAL_COMMUNITY)
Admission: RE | Admit: 2015-12-25 | Discharge: 2015-12-25 | Disposition: A | Discharge status: Home / Self Care | Payer: Self-pay | Source: Ambulatory Visit | Attending: Cardiology | Admitting: Cardiology

## 2015-12-29 ENCOUNTER — Encounter (HOSPITAL_COMMUNITY)
Admission: RE | Admit: 2015-12-29 | Discharge: 2015-12-29 | Disposition: A | Discharge status: Home / Self Care | Payer: Self-pay | Source: Ambulatory Visit | Attending: Cardiology | Admitting: Cardiology

## 2015-12-31 ENCOUNTER — Encounter (HOSPITAL_COMMUNITY)
Admission: RE | Admit: 2015-12-31 | Discharge: 2015-12-31 | Disposition: A | Discharge status: Home / Self Care | Payer: Self-pay | Source: Ambulatory Visit | Attending: Cardiology | Admitting: Cardiology

## 2015-12-31 DIAGNOSIS — E113511 Type 2 diabetes mellitus with proliferative diabetic retinopathy with macular edema, right eye: Secondary | ICD-10-CM | POA: Diagnosis not present

## 2015-12-31 DIAGNOSIS — E113512 Type 2 diabetes mellitus with proliferative diabetic retinopathy with macular edema, left eye: Secondary | ICD-10-CM | POA: Diagnosis not present

## 2015-12-31 DIAGNOSIS — H211X3 Other vascular disorders of iris and ciliary body, bilateral: Secondary | ICD-10-CM | POA: Diagnosis not present

## 2016-01-01 ENCOUNTER — Encounter (HOSPITAL_COMMUNITY)
Admission: RE | Admit: 2016-01-01 | Discharge: 2016-01-01 | Disposition: A | Discharge status: Home / Self Care | Payer: Self-pay | Source: Ambulatory Visit | Attending: Cardiology | Admitting: Cardiology

## 2016-01-05 ENCOUNTER — Encounter (HOSPITAL_COMMUNITY)
Admission: RE | Admit: 2016-01-05 | Discharge: 2016-01-05 | Disposition: A | Discharge status: Home / Self Care | Payer: Self-pay | Source: Ambulatory Visit | Attending: Cardiology | Admitting: Cardiology

## 2016-01-07 ENCOUNTER — Encounter (HOSPITAL_COMMUNITY)
Admission: RE | Admit: 2016-01-07 | Discharge: 2016-01-07 | Disposition: A | Discharge status: Home / Self Care | Payer: Self-pay | Source: Ambulatory Visit | Attending: Cardiology | Admitting: Cardiology

## 2016-01-08 ENCOUNTER — Encounter (HOSPITAL_COMMUNITY)
Admission: RE | Admit: 2016-01-08 | Discharge: 2016-01-08 | Disposition: A | Discharge status: Home / Self Care | Payer: Self-pay | Source: Ambulatory Visit | Attending: Cardiology | Admitting: Cardiology

## 2016-01-12 ENCOUNTER — Encounter (HOSPITAL_COMMUNITY)
Admission: RE | Admit: 2016-01-12 | Discharge: 2016-01-12 | Disposition: A | Discharge status: Home / Self Care | Payer: Self-pay | Source: Ambulatory Visit | Attending: Cardiology | Admitting: Cardiology

## 2016-01-15 ENCOUNTER — Encounter (HOSPITAL_COMMUNITY)
Admission: RE | Admit: 2016-01-15 | Discharge: 2016-01-15 | Disposition: A | Discharge status: Home / Self Care | Payer: Self-pay | Source: Ambulatory Visit | Attending: Cardiology | Admitting: Cardiology

## 2016-01-15 DIAGNOSIS — Z951 Presence of aortocoronary bypass graft: Secondary | ICD-10-CM | POA: Insufficient documentation

## 2016-01-19 ENCOUNTER — Encounter (HOSPITAL_COMMUNITY)
Admission: RE | Admit: 2016-01-19 | Discharge: 2016-01-19 | Disposition: A | Discharge status: Home / Self Care | Payer: Self-pay | Source: Ambulatory Visit | Attending: Cardiology | Admitting: Cardiology

## 2016-01-21 ENCOUNTER — Encounter (HOSPITAL_COMMUNITY)
Admission: RE | Admit: 2016-01-21 | Discharge: 2016-01-21 | Disposition: A | Discharge status: Home / Self Care | Payer: Self-pay | Source: Ambulatory Visit | Attending: Cardiology | Admitting: Cardiology

## 2016-01-22 ENCOUNTER — Encounter (HOSPITAL_COMMUNITY)
Admission: RE | Admit: 2016-01-22 | Discharge: 2016-01-22 | Disposition: A | Discharge status: Home / Self Care | Payer: Self-pay | Source: Ambulatory Visit | Attending: Cardiology | Admitting: Cardiology

## 2016-01-25 DIAGNOSIS — N183 Chronic kidney disease, stage 3 (moderate): Secondary | ICD-10-CM | POA: Diagnosis not present

## 2016-01-25 DIAGNOSIS — Z7984 Long term (current) use of oral hypoglycemic drugs: Secondary | ICD-10-CM | POA: Diagnosis not present

## 2016-01-25 DIAGNOSIS — Z0001 Encounter for general adult medical examination with abnormal findings: Secondary | ICD-10-CM | POA: Diagnosis not present

## 2016-01-25 DIAGNOSIS — I10 Essential (primary) hypertension: Secondary | ICD-10-CM | POA: Diagnosis not present

## 2016-01-25 DIAGNOSIS — G4700 Insomnia, unspecified: Secondary | ICD-10-CM | POA: Diagnosis not present

## 2016-01-25 DIAGNOSIS — E7800 Pure hypercholesterolemia, unspecified: Secondary | ICD-10-CM | POA: Diagnosis not present

## 2016-01-25 DIAGNOSIS — E1122 Type 2 diabetes mellitus with diabetic chronic kidney disease: Secondary | ICD-10-CM | POA: Diagnosis not present

## 2016-01-25 DIAGNOSIS — Z79899 Other long term (current) drug therapy: Secondary | ICD-10-CM | POA: Diagnosis not present

## 2016-01-25 DIAGNOSIS — D500 Iron deficiency anemia secondary to blood loss (chronic): Secondary | ICD-10-CM | POA: Diagnosis not present

## 2016-01-25 DIAGNOSIS — Z125 Encounter for screening for malignant neoplasm of prostate: Secondary | ICD-10-CM | POA: Diagnosis not present

## 2016-01-25 DIAGNOSIS — I2510 Atherosclerotic heart disease of native coronary artery without angina pectoris: Secondary | ICD-10-CM | POA: Diagnosis not present

## 2016-01-26 ENCOUNTER — Encounter (HOSPITAL_COMMUNITY)
Admission: RE | Admit: 2016-01-26 | Discharge: 2016-01-26 | Disposition: A | Discharge status: Home / Self Care | Payer: Self-pay | Source: Ambulatory Visit | Attending: Cardiology | Admitting: Cardiology

## 2016-01-28 ENCOUNTER — Encounter (HOSPITAL_COMMUNITY)
Admission: RE | Admit: 2016-01-28 | Discharge: 2016-01-28 | Disposition: A | Discharge status: Home / Self Care | Payer: Self-pay | Source: Ambulatory Visit | Attending: Cardiology | Admitting: Cardiology

## 2016-01-29 ENCOUNTER — Encounter (HOSPITAL_COMMUNITY)
Admission: RE | Admit: 2016-01-29 | Discharge: 2016-01-29 | Disposition: A | Discharge status: Home / Self Care | Payer: Self-pay | Source: Ambulatory Visit | Attending: Cardiology | Admitting: Cardiology

## 2016-02-02 ENCOUNTER — Encounter (HOSPITAL_COMMUNITY)
Admission: RE | Admit: 2016-02-02 | Discharge: 2016-02-02 | Disposition: A | Discharge status: Home / Self Care | Payer: Self-pay | Source: Ambulatory Visit | Attending: Cardiology | Admitting: Cardiology

## 2016-02-04 ENCOUNTER — Encounter (HOSPITAL_COMMUNITY)
Admission: RE | Admit: 2016-02-04 | Discharge: 2016-02-04 | Disposition: A | Discharge status: Home / Self Care | Payer: Self-pay | Source: Ambulatory Visit | Attending: Cardiology | Admitting: Cardiology

## 2016-02-05 ENCOUNTER — Encounter (HOSPITAL_COMMUNITY)
Admission: RE | Admit: 2016-02-05 | Discharge: 2016-02-05 | Disposition: A | Discharge status: Home / Self Care | Payer: Self-pay | Source: Ambulatory Visit | Attending: Cardiology | Admitting: Cardiology

## 2016-02-08 DIAGNOSIS — E113511 Type 2 diabetes mellitus with proliferative diabetic retinopathy with macular edema, right eye: Secondary | ICD-10-CM | POA: Diagnosis not present

## 2016-02-08 DIAGNOSIS — E113512 Type 2 diabetes mellitus with proliferative diabetic retinopathy with macular edema, left eye: Secondary | ICD-10-CM | POA: Diagnosis not present

## 2016-02-09 ENCOUNTER — Encounter (HOSPITAL_COMMUNITY)
Admission: RE | Admit: 2016-02-09 | Discharge: 2016-02-09 | Disposition: A | Discharge status: Home / Self Care | Payer: Self-pay | Source: Ambulatory Visit | Attending: Cardiology | Admitting: Cardiology

## 2016-02-11 ENCOUNTER — Encounter (HOSPITAL_COMMUNITY)
Admission: RE | Admit: 2016-02-11 | Discharge: 2016-02-11 | Disposition: A | Discharge status: Home / Self Care | Payer: Self-pay | Source: Ambulatory Visit | Attending: Cardiology | Admitting: Cardiology

## 2016-02-12 ENCOUNTER — Encounter (HOSPITAL_COMMUNITY)
Admission: RE | Admit: 2016-02-12 | Discharge: 2016-02-12 | Disposition: A | Discharge status: Home / Self Care | Payer: Self-pay | Source: Ambulatory Visit | Attending: Cardiology | Admitting: Cardiology

## 2016-02-15 DIAGNOSIS — E113511 Type 2 diabetes mellitus with proliferative diabetic retinopathy with macular edema, right eye: Secondary | ICD-10-CM | POA: Diagnosis not present

## 2016-02-18 ENCOUNTER — Encounter (HOSPITAL_COMMUNITY)
Admission: RE | Admit: 2016-02-18 | Discharge: 2016-02-18 | Disposition: A | Discharge status: Home / Self Care | Payer: Self-pay | Source: Ambulatory Visit | Attending: Cardiology | Admitting: Cardiology

## 2016-02-18 DIAGNOSIS — Z951 Presence of aortocoronary bypass graft: Secondary | ICD-10-CM | POA: Insufficient documentation

## 2016-02-19 ENCOUNTER — Encounter (HOSPITAL_COMMUNITY)
Admission: RE | Admit: 2016-02-19 | Discharge: 2016-02-19 | Disposition: A | Discharge status: Home / Self Care | Payer: Self-pay | Source: Ambulatory Visit | Attending: Cardiology | Admitting: Cardiology

## 2016-02-22 DIAGNOSIS — E113512 Type 2 diabetes mellitus with proliferative diabetic retinopathy with macular edema, left eye: Secondary | ICD-10-CM | POA: Diagnosis not present

## 2016-02-23 ENCOUNTER — Encounter (HOSPITAL_COMMUNITY)
Admission: RE | Admit: 2016-02-23 | Discharge: 2016-02-23 | Disposition: A | Discharge status: Home / Self Care | Payer: Self-pay | Source: Ambulatory Visit | Attending: Cardiology | Admitting: Cardiology

## 2016-02-25 ENCOUNTER — Encounter (HOSPITAL_COMMUNITY)
Admission: RE | Admit: 2016-02-25 | Discharge: 2016-02-25 | Disposition: A | Discharge status: Home / Self Care | Payer: Self-pay | Source: Ambulatory Visit | Attending: Cardiology | Admitting: Cardiology

## 2016-02-26 ENCOUNTER — Encounter (HOSPITAL_COMMUNITY)
Admission: RE | Admit: 2016-02-26 | Discharge: 2016-02-26 | Disposition: A | Discharge status: Home / Self Care | Payer: Self-pay | Source: Ambulatory Visit | Attending: Cardiology | Admitting: Cardiology

## 2016-03-01 ENCOUNTER — Encounter (HOSPITAL_COMMUNITY)
Admission: RE | Admit: 2016-03-01 | Discharge: 2016-03-01 | Disposition: A | Discharge status: Home / Self Care | Payer: Self-pay | Source: Ambulatory Visit | Attending: Cardiology | Admitting: Cardiology

## 2016-03-03 ENCOUNTER — Encounter (HOSPITAL_COMMUNITY)
Admission: RE | Admit: 2016-03-03 | Discharge: 2016-03-03 | Disposition: A | Discharge status: Home / Self Care | Payer: Self-pay | Source: Ambulatory Visit | Attending: Cardiology | Admitting: Cardiology

## 2016-03-04 ENCOUNTER — Encounter (HOSPITAL_COMMUNITY)
Admission: RE | Admit: 2016-03-04 | Discharge: 2016-03-04 | Disposition: A | Discharge status: Home / Self Care | Payer: Self-pay | Source: Ambulatory Visit | Attending: Cardiology | Admitting: Cardiology

## 2016-03-08 ENCOUNTER — Encounter (HOSPITAL_COMMUNITY)
Admission: RE | Admit: 2016-03-08 | Discharge: 2016-03-08 | Disposition: A | Discharge status: Home / Self Care | Payer: Self-pay | Source: Ambulatory Visit | Attending: Cardiology | Admitting: Cardiology

## 2016-03-10 ENCOUNTER — Encounter (HOSPITAL_COMMUNITY)
Admission: RE | Admit: 2016-03-10 | Discharge: 2016-03-10 | Disposition: A | Discharge status: Home / Self Care | Payer: Self-pay | Source: Ambulatory Visit | Attending: Cardiology | Admitting: Cardiology

## 2016-03-11 ENCOUNTER — Encounter (HOSPITAL_COMMUNITY)
Admission: RE | Admit: 2016-03-11 | Discharge: 2016-03-11 | Disposition: A | Discharge status: Home / Self Care | Payer: Self-pay | Source: Ambulatory Visit | Attending: Cardiology | Admitting: Cardiology

## 2016-03-14 DIAGNOSIS — E113592 Type 2 diabetes mellitus with proliferative diabetic retinopathy without macular edema, left eye: Secondary | ICD-10-CM | POA: Diagnosis not present

## 2016-03-14 DIAGNOSIS — E113591 Type 2 diabetes mellitus with proliferative diabetic retinopathy without macular edema, right eye: Secondary | ICD-10-CM | POA: Diagnosis not present

## 2016-03-15 ENCOUNTER — Encounter (HOSPITAL_COMMUNITY)
Admission: RE | Admit: 2016-03-15 | Discharge: 2016-03-15 | Disposition: A | Discharge status: Home / Self Care | Payer: Self-pay | Source: Ambulatory Visit | Attending: Cardiology | Admitting: Cardiology

## 2016-03-15 DIAGNOSIS — Z951 Presence of aortocoronary bypass graft: Secondary | ICD-10-CM | POA: Insufficient documentation

## 2016-03-17 ENCOUNTER — Encounter (HOSPITAL_COMMUNITY): Admission: RE | Admit: 2016-03-17 | Payer: Self-pay | Source: Ambulatory Visit

## 2016-03-17 DIAGNOSIS — D500 Iron deficiency anemia secondary to blood loss (chronic): Secondary | ICD-10-CM | POA: Diagnosis not present

## 2016-03-17 DIAGNOSIS — Z86010 Personal history of colonic polyps: Secondary | ICD-10-CM | POA: Diagnosis not present

## 2016-03-18 ENCOUNTER — Encounter (HOSPITAL_COMMUNITY)
Admission: RE | Admit: 2016-03-18 | Discharge: 2016-03-18 | Disposition: A | Discharge status: Home / Self Care | Payer: Self-pay | Source: Ambulatory Visit | Attending: Cardiology | Admitting: Cardiology

## 2016-03-22 ENCOUNTER — Encounter (HOSPITAL_COMMUNITY): Payer: Self-pay

## 2016-03-24 ENCOUNTER — Encounter (HOSPITAL_COMMUNITY)
Admission: RE | Admit: 2016-03-24 | Discharge: 2016-03-24 | Disposition: A | Discharge status: Home / Self Care | Payer: Self-pay | Source: Ambulatory Visit | Attending: Cardiology | Admitting: Cardiology

## 2016-03-25 ENCOUNTER — Encounter (HOSPITAL_COMMUNITY)
Admission: RE | Admit: 2016-03-25 | Discharge: 2016-03-25 | Disposition: A | Discharge status: Home / Self Care | Payer: Self-pay | Source: Ambulatory Visit | Attending: Cardiology | Admitting: Cardiology

## 2016-03-29 ENCOUNTER — Encounter (HOSPITAL_COMMUNITY)
Admission: RE | Admit: 2016-03-29 | Discharge: 2016-03-29 | Disposition: A | Discharge status: Home / Self Care | Payer: Self-pay | Source: Ambulatory Visit | Attending: Cardiology | Admitting: Cardiology

## 2016-03-31 ENCOUNTER — Encounter (HOSPITAL_COMMUNITY): Payer: Self-pay

## 2016-03-31 DIAGNOSIS — E103512 Type 1 diabetes mellitus with proliferative diabetic retinopathy with macular edema, left eye: Secondary | ICD-10-CM | POA: Diagnosis not present

## 2016-03-31 DIAGNOSIS — E103511 Type 1 diabetes mellitus with proliferative diabetic retinopathy with macular edema, right eye: Secondary | ICD-10-CM | POA: Diagnosis not present

## 2016-04-01 ENCOUNTER — Encounter (HOSPITAL_COMMUNITY)
Admission: RE | Admit: 2016-04-01 | Discharge: 2016-04-01 | Disposition: A | Discharge status: Home / Self Care | Payer: Self-pay | Source: Ambulatory Visit | Attending: Cardiology | Admitting: Cardiology

## 2016-04-05 ENCOUNTER — Encounter (HOSPITAL_COMMUNITY)
Admission: RE | Admit: 2016-04-05 | Discharge: 2016-04-05 | Disposition: A | Discharge status: Home / Self Care | Payer: Self-pay | Source: Ambulatory Visit | Attending: Cardiology | Admitting: Cardiology

## 2016-04-06 ENCOUNTER — Other Ambulatory Visit: Payer: Self-pay | Admitting: Cardiology

## 2016-04-06 DIAGNOSIS — E7800 Pure hypercholesterolemia, unspecified: Secondary | ICD-10-CM | POA: Diagnosis not present

## 2016-04-06 DIAGNOSIS — I10 Essential (primary) hypertension: Secondary | ICD-10-CM | POA: Diagnosis not present

## 2016-04-06 DIAGNOSIS — E1165 Type 2 diabetes mellitus with hyperglycemia: Secondary | ICD-10-CM | POA: Diagnosis not present

## 2016-04-06 DIAGNOSIS — N189 Chronic kidney disease, unspecified: Secondary | ICD-10-CM | POA: Diagnosis not present

## 2016-04-07 ENCOUNTER — Encounter (HOSPITAL_COMMUNITY)
Admission: RE | Admit: 2016-04-07 | Discharge: 2016-04-07 | Disposition: A | Discharge status: Home / Self Care | Payer: Self-pay | Source: Ambulatory Visit | Attending: Cardiology | Admitting: Cardiology

## 2016-04-08 ENCOUNTER — Encounter (HOSPITAL_COMMUNITY)
Admission: RE | Admit: 2016-04-08 | Discharge: 2016-04-08 | Disposition: A | Discharge status: Home / Self Care | Payer: Self-pay | Source: Ambulatory Visit | Attending: Cardiology | Admitting: Cardiology

## 2016-04-12 ENCOUNTER — Encounter (HOSPITAL_COMMUNITY): Payer: Self-pay

## 2016-04-13 ENCOUNTER — Encounter (HOSPITAL_COMMUNITY)
Admission: RE | Admit: 2016-04-13 | Discharge: 2016-04-13 | Disposition: A | Discharge status: Home / Self Care | Payer: Self-pay | Source: Ambulatory Visit | Attending: Cardiology | Admitting: Cardiology

## 2016-04-14 ENCOUNTER — Encounter (HOSPITAL_COMMUNITY): Payer: Self-pay

## 2016-04-14 DIAGNOSIS — E113512 Type 2 diabetes mellitus with proliferative diabetic retinopathy with macular edema, left eye: Secondary | ICD-10-CM | POA: Diagnosis not present

## 2016-04-15 ENCOUNTER — Encounter (HOSPITAL_COMMUNITY)
Admission: RE | Admit: 2016-04-15 | Discharge: 2016-04-15 | Disposition: A | Discharge status: Home / Self Care | Payer: Self-pay | Source: Ambulatory Visit | Attending: Cardiology | Admitting: Cardiology

## 2016-04-15 DIAGNOSIS — Z951 Presence of aortocoronary bypass graft: Secondary | ICD-10-CM | POA: Insufficient documentation

## 2016-04-19 ENCOUNTER — Encounter (HOSPITAL_COMMUNITY)
Admission: RE | Admit: 2016-04-19 | Discharge: 2016-04-19 | Disposition: A | Discharge status: Home / Self Care | Payer: Self-pay | Source: Ambulatory Visit | Attending: Cardiology | Admitting: Cardiology

## 2016-04-21 ENCOUNTER — Encounter (HOSPITAL_COMMUNITY)
Admission: RE | Admit: 2016-04-21 | Discharge: 2016-04-21 | Disposition: A | Discharge status: Home / Self Care | Payer: Self-pay | Source: Ambulatory Visit | Attending: Cardiology | Admitting: Cardiology

## 2016-04-22 ENCOUNTER — Encounter (HOSPITAL_COMMUNITY)
Admission: RE | Admit: 2016-04-22 | Discharge: 2016-04-22 | Disposition: A | Discharge status: Home / Self Care | Payer: Self-pay | Source: Ambulatory Visit | Attending: Cardiology | Admitting: Cardiology

## 2016-04-26 ENCOUNTER — Encounter (HOSPITAL_COMMUNITY)
Admission: RE | Admit: 2016-04-26 | Discharge: 2016-04-26 | Disposition: A | Discharge status: Home / Self Care | Payer: Self-pay | Source: Ambulatory Visit | Attending: Cardiology | Admitting: Cardiology

## 2016-04-28 ENCOUNTER — Encounter (HOSPITAL_COMMUNITY)
Admission: RE | Admit: 2016-04-28 | Discharge: 2016-04-28 | Disposition: A | Discharge status: Home / Self Care | Payer: Self-pay | Source: Ambulatory Visit | Attending: Cardiology | Admitting: Cardiology

## 2016-04-29 ENCOUNTER — Encounter (HOSPITAL_COMMUNITY)
Admission: RE | Admit: 2016-04-29 | Discharge: 2016-04-29 | Disposition: A | Discharge status: Home / Self Care | Payer: Self-pay | Source: Ambulatory Visit | Attending: Cardiology | Admitting: Cardiology

## 2016-05-03 ENCOUNTER — Encounter (HOSPITAL_COMMUNITY)
Admission: RE | Admit: 2016-05-03 | Discharge: 2016-05-03 | Disposition: A | Discharge status: Home / Self Care | Payer: Self-pay | Source: Ambulatory Visit | Attending: Cardiology | Admitting: Cardiology

## 2016-05-03 ENCOUNTER — Encounter: Payer: Self-pay | Admitting: Cardiology

## 2016-05-05 ENCOUNTER — Encounter (HOSPITAL_COMMUNITY)
Admission: RE | Admit: 2016-05-05 | Discharge: 2016-05-05 | Disposition: A | Discharge status: Home / Self Care | Payer: Self-pay | Source: Ambulatory Visit | Attending: Cardiology | Admitting: Cardiology

## 2016-05-06 ENCOUNTER — Encounter (HOSPITAL_COMMUNITY)
Admission: RE | Admit: 2016-05-06 | Discharge: 2016-05-06 | Disposition: A | Discharge status: Home / Self Care | Payer: Self-pay | Source: Ambulatory Visit | Attending: Cardiology | Admitting: Cardiology

## 2016-05-10 ENCOUNTER — Encounter (HOSPITAL_COMMUNITY)
Admission: RE | Admit: 2016-05-10 | Discharge: 2016-05-10 | Disposition: A | Discharge status: Home / Self Care | Payer: Self-pay | Source: Ambulatory Visit | Attending: Cardiology | Admitting: Cardiology

## 2016-05-10 DIAGNOSIS — Z23 Encounter for immunization: Secondary | ICD-10-CM | POA: Diagnosis not present

## 2016-05-11 DIAGNOSIS — E113513 Type 2 diabetes mellitus with proliferative diabetic retinopathy with macular edema, bilateral: Secondary | ICD-10-CM | POA: Diagnosis not present

## 2016-05-12 ENCOUNTER — Encounter (HOSPITAL_COMMUNITY): Payer: Self-pay

## 2016-05-13 ENCOUNTER — Encounter (HOSPITAL_COMMUNITY)
Admission: RE | Admit: 2016-05-13 | Discharge: 2016-05-13 | Disposition: A | Discharge status: Home / Self Care | Payer: Self-pay | Source: Ambulatory Visit | Attending: Cardiology | Admitting: Cardiology

## 2016-05-16 ENCOUNTER — Ambulatory Visit (INDEPENDENT_AMBULATORY_CARE_PROVIDER_SITE_OTHER): Payer: Medicare Other | Admitting: Cardiology

## 2016-05-16 ENCOUNTER — Encounter: Payer: Self-pay | Admitting: Cardiology

## 2016-05-16 VITALS — BP 130/86 | HR 97 | Ht 71.0 in | Wt 182.0 lb

## 2016-05-16 DIAGNOSIS — I2510 Atherosclerotic heart disease of native coronary artery without angina pectoris: Secondary | ICD-10-CM | POA: Diagnosis not present

## 2016-05-16 DIAGNOSIS — I350 Nonrheumatic aortic (valve) stenosis: Secondary | ICD-10-CM

## 2016-05-16 DIAGNOSIS — I10 Essential (primary) hypertension: Secondary | ICD-10-CM | POA: Diagnosis not present

## 2016-05-16 DIAGNOSIS — E785 Hyperlipidemia, unspecified: Secondary | ICD-10-CM

## 2016-05-16 DIAGNOSIS — R000 Tachycardia, unspecified: Secondary | ICD-10-CM

## 2016-05-16 LAB — LIPID PANEL
CHOLESTEROL: 122 mg/dL — AB (ref 125–200)
HDL: 42 mg/dL (ref >=40)
LDL Cholesterol: 72 mg/dL (ref <130)
TRIGLYCERIDES: 41 mg/dL (ref <150)
Total CHOL/HDL Ratio: 2.9 Ratio (ref <=5.0)
VLDL: 8 mg/dL (ref <30)

## 2016-05-16 LAB — HEPATIC FUNCTION PANEL
ALT: 36 U/L (ref 9–46)
AST: 30 U/L (ref 10–35)
Albumin: 4.3 g/dL (ref 3.6–5.1)
Alkaline Phosphatase: 92 U/L (ref 40–115)
Bilirubin, Direct: 0.1 mg/dL
Indirect Bilirubin: 0.5 mg/dL (ref 0.2–1.2)
Total Bilirubin: 0.6 mg/dL (ref 0.2–1.2)
Total Protein: 7.2 g/dL (ref 6.1–8.1)

## 2016-05-16 NOTE — Progress Notes (Signed)
Cardiology Office Note    Date:  05/16/2016   ID:  [name], DOB [birth year], MRN [id number]  PCP:  Bill Amel, MD  Cardiologist:  Bill Him, MD   Chief Complaint  Patient presents with  . Coronary Artery Disease  . Hypertension  . Hyperlipidemia    History of Present Illness:  [name] is a 65 y.o. male with a hx of CAD, HL, HTN, prior CVA, diabetes, aortic stenosis. Patient suffered a non-STEMI in 2013 treated with PCI of the LCx/OM. LHC in 5/14 demonstrated severe 2 vessel CAD with critical in-stent restenosis in the LCx. He underwent CABG (SVG-PDA, SVG-LCx). Echo in 11/15 with normal LV function and mild aortic stenosis with a mean gradient of 11 mmHg and mild to moderate AI. He was seen last winter by Bill Dopp, PA and was complaining of SOB.  ETT showed no ischemia and echo showed normal LVF with mild AS and moderate AR which was stabel from 2015.  He is doing well today.  He denies any chest pain or pressure, SOB, DOE, LE edema, dizziness, palpitations, claudication or syncope.   Past Medical History:  Diagnosis Date  . AAA (abdominal aortic aneurysm) (HCC)    (-)11/11,CT abdomen-no aneurysm aortic  . Aortic stenosis, mild 06/27/2014   Echo 3/17: EF 55-60%, normal wall motion, normal diastolic function, mild aortic stenosis (mean 17 mmHg), moderate AI, MAC  . Cancer [facility])    mediastinal seminoma resected 1985 - benign  . Chest pain   . Chronic kidney disease    kidney stones s/p lithotripsy  . Coronary artery disease 02/2012   60% mid RCA, 80% prox left circ, 99% distal left circ, normal LAD s/p PCI left circ/OM, 02/2012 -now s/p CABG  // ETT 3/17: No ischemic changes, Hypertensive BP response.  . Diabetes mellitus    neuropathy  insulin dependent  . Dyslipidemia   . Elevated cholesterol   . Erectile dysfunction   . Erectile dysfunction   . GERD (gastroesophageal reflux disease)   . Hypertension   . Insomnia   . Insomnia   . NSTEMI (non-ST  elevated myocardial infarction) ([place])   . Shortness of breath   . Stroke ([place])    TIA's  . TIA (transient ischemic attack)     Past Surgical History:  Procedure Laterality Date  . COLONOSCOPY  07/25/2012   Procedure: COLONOSCOPY;  Surgeon: Winfield Cunas., MD;  Location: [facility] ENDOSCOPY;  Service: Endoscopy;  Laterality: N/A;  . COLONOSCOPY N/A 12/02/2013   Procedure: COLONOSCOPY;  Surgeon: Winfield Cunas., MD;  Location: [facility] ENDOSCOPY;  Service: Endoscopy;  Laterality: N/A;  . CORONARY ARTERY BYPASS GRAFT N/A 01/04/2013   Procedure: CORONARY ARTERY BYPASS GRAFTING (CABG) times two using right saphenous vein harvested with endoscope.;  Surgeon: Ivin Poot, MD;  Location: [place];  Service: Open Heart Surgery;  Laterality: N/A;  . ESOPHAGOGASTRODUODENOSCOPY  07/20/2012   Procedure: ESOPHAGOGASTRODUODENOSCOPY (EGD);  Surgeon: Winfield Cunas., MD;  Location: [facility] ENDOSCOPY;  Service: Endoscopy;  Laterality: N/A;  . ESOPHAGOGASTRODUODENOSCOPY N/A 12/02/2013   Procedure: ESOPHAGOGASTRODUODENOSCOPY (EGD);  Surgeon: Winfield Cunas., MD;  Location: [facility] ENDOSCOPY;  Service: Endoscopy;  Laterality: N/A;  . HOT HEMOSTASIS N/A 12/02/2013   Procedure: HOT HEMOSTASIS (ARGON PLASMA COAGULATION/BICAP);  Surgeon: Winfield Cunas., MD;  Location: [facility] ENDOSCOPY;  Service: Endoscopy;  Laterality: N/A;  . INTRAOPERATIVE TRANSESOPHAGEAL ECHOCARDIOGRAM N/A 01/04/2013   Procedure: INTRAOPERATIVE TRANSESOPHAGEAL ECHOCARDIOGRAM;  Surgeon: Ivin Poot, MD;  Location: [facility] OR;  Service: Open Heart Surgery;  Laterality: N/A;  . LEFT HEART CATHETERIZATION WITH CORONARY ANGIOGRAM N/A 03/06/2012   Procedure: LEFT HEART CATHETERIZATION WITH CORONARY ANGIOGRAM;  Surgeon: [name], MD;  Location: [place] CATH LAB;  Service: Cardiovascular;  Laterality: N/A;  . lithotripsy    . RADIAL ARTERY HARVEST Left 01/04/2013   Procedure: RADIAL ARTERY HARVEST;  Surgeon: Ivin Poot, MD;  Location: [place];  Service:  Vascular;  Laterality: Left;  Artery not havested. Unsuitable for use.  . resection mediastinal seminonma      Current Medications: Outpatient Medications Prior to Visit  Medication Sig Dispense Refill  . amitriptyline (ELAVIL) 100 MG tablet Take 100 mg by mouth at bedtime.    Marland Kitchen aspirin 81 MG EC tablet Take 1 tablet (81 mg total) by mouth daily.    . B-D ULTRAFINE III SHORT PEN 31G X 8 MM MISC 1 each by Other route daily as needed (GLUCOSE TESTING (INSULINE NEEDLE)).     . insulin aspart (NOVOLOG) 100 UNIT/ML injection Inject 6-14 Units into the skin 3 (three) times daily before meals. Patient's dose is done on a sliding scale. Usually takes 8 units, but dose varies depending on glucose level.    . iron polysaccharides (NIFEREX) 150 MG capsule Take 150 mg by mouth daily.     Marland Kitchen LEVEMIR FLEXTOUCH 100 UNIT/ML Pen INJECT 42-44 UNITS INTO THE SKIN ONCE AT BEDTIME  6  . metFORMIN (GLUCOPHAGE) 500 MG tablet Take 1,000 mg by mouth 2 (two) times daily with a meal.    . metoprolol succinate (TOPROL-XL) 50 MG 24 hr tablet TAKE 1 TABLET BY MOUTH EVERY DAY. TAKE WITH OR IMMEDIATELY FOLLOWING A MEAL 90 tablet 3  . Multiple Vitamin (MULTIVITAMIN) tablet Take 1 tablet by mouth daily.    . Omega-3 Fatty Acids (FISH OIL MAXIMUM STRENGTH) 1200 MG CAPS Take 3,000 mg by mouth 2 (two) times daily.     . ONE TOUCH ULTRA TEST test strip 1 each by Other route as needed for other.     . simvastatin (ZOCOR) 40 MG tablet Take 40 mg by mouth every evening.    . zolpidem (AMBIEN) 10 MG tablet Take 10 mg by mouth at bedtime as needed for sleep.      No facility-administered medications prior to visit.      Allergies:   Lipitor [atorvastatin] and Adhesive [tape]   Social History   Social History  . Marital status: Married    Spouse name: N/A  . Number of children: N/A  . Years of education: N/A   Social History Main Topics  . Smoking status: Former Smoker    Quit date: 03/06/1985  . Smokeless tobacco: Never Used   . Alcohol use No  . Drug use: No  . Sexual activity: Not Currently   Other Topics Concern  . None   Social History Narrative  . None     Family History:  The patient's family history includes Cancer in his brother; Diabetes in his father, mother, and sister; Heart failure in his mother; Hypertension in his mother.   ROS:   Please see the history of present illness.    ROS All other systems reviewed and are negative.  No flowsheet data found.     PHYSICAL EXAM:   VS:  BP 130/86   Pulse 97   Ht '5\' 11"'$  (1.803 m)   Wt 182 lb (82.6 kg)   BMI 25.38 kg/m    GEN: Well  nourished, well developed, in no acute distress  HEENT: normal  Neck: no JVD, carotid bruits, or masses Cardiac: RRR; no murmurs, rubs, or gallops,no edema.  Intact distal pulses bilaterally.  Respiratory:  clear to auscultation bilaterally, normal work of breathing GI: soft, nontender, nondistended, + BS MS: no deformity or atrophy  Skin: warm and dry, no rash Neuro:  Alert and Oriented x 3, Strength and sensation are intact Psych: euthymic mood, full affect  Wt Readings from Last 3 Encounters:  05/16/16 182 lb (82.6 kg)  10/06/15 189 lb 9.6 oz (86 kg)  06/26/14 192 lb 12 oz (87.4 kg)      Studies/Labs Reviewed:   EKG:  EKG is not ordered today.    Recent Labs: No results found for requested labs within last 8760 hours.   Lipid Panel    Component Value Date/Time   CHOL 104 02/11/2014 0741   TRIG 71.0 02/11/2014 0741   HDL 35.30 (L) 02/11/2014 0741   CHOLHDL 3 02/11/2014 0741   VLDL 14.2 02/11/2014 0741   LDLCALC 55 02/11/2014 0741    Additional studies/ records that were reviewed today include:  none    ASSESSMENT:    1. Coronary artery disease involving native coronary artery of native heart without angina pectoris   2. Aortic stenosis, mild   3. Essential hypertension   4. Dyslipidemia   5. Sinus tachycardia      PLAN:  In order of problems listed above:  1. ASCAD with  remote PCI of the LCx/OM and then severe restenosis s/p CABG.  He has no anginal symptoms and recent ETT showed no ischemia.  Continue ASA/statin and BB. 2. Mild AS and moderate AR by echo 10/2015.  Repeat echo 10/2016 to assess for progression of AI.  His LV dimensions and LVF were normal. 3. HTN - BP controlled on current meds.  Continue BB. 4. Hyperlipidemia with LDL goal < 70.  Continue statin.  Check FLP and ALT. 5. Tachycardia - his HR has been running mildly elevated at cardiac rehab and sounds fast on exam.  I will get a 24 hour Holter to assess average heart rate.    Medication Adjustments/Labs and Tests Ordered: Current medicines are reviewed at length with the patient today.  Concerns regarding medicines are outlined above.  Medication changes, Labs and Tests ordered today are listed in the Patient Instructions below.  There are no Patient Instructions on file for this visit.   Signed, Bill Him, MD  05/16/2016 10:07 AM    [facility], [place], [place]  [address] Phone: [phone]; Fax: [phone]

## 2016-05-16 NOTE — Patient Instructions (Signed)
Medication Instructions:  Your physician recommends that you continue on your current medications as directed. Please refer to the Current Medication list given to you today.   Labwork: TODAY: LFTs, Lipids  Testing/Procedures: Your physician has requested that you have an echocardiogram in March, 2018. Echocardiography is a painless test that uses sound waves to create images of your heart. It provides your doctor with information about the size and shape of your heart and how well your heart's chambers and valves are working. This procedure takes approximately one hour. There are no restrictions for this procedure.   Your physician has recommended that you wear a holter monitor. Holter monitors are medical devices that record the heart's electrical activity. Doctors most often use these monitors to diagnose arrhythmias. Arrhythmias are problems with the speed or rhythm of the heartbeat. The monitor is a small, portable device. You can wear one while you do your normal daily activities. This is usually used to diagnose what is causing palpitations/syncope (passing out).  Follow-Up: Your physician wants you to follow-up in: 6 months with Dr. Radford Pax. You will receive a reminder letter in the mail two months in advance. If you don't receive a letter, please call our office to schedule the follow-up appointment.  Any Other Special Instructions Will Be Listed Below (If Applicable).     If you need a refill on your cardiac medications before your next appointment, please call your pharmacy.

## 2016-05-17 ENCOUNTER — Encounter (HOSPITAL_COMMUNITY)
Admission: RE | Admit: 2016-05-17 | Discharge: 2016-05-17 | Disposition: A | Discharge status: Home / Self Care | Payer: Self-pay | Source: Ambulatory Visit | Attending: Cardiology | Admitting: Cardiology

## 2016-05-17 ENCOUNTER — Telehealth: Payer: Self-pay | Admitting: Cardiology

## 2016-05-17 DIAGNOSIS — Z951 Presence of aortocoronary bypass graft: Secondary | ICD-10-CM | POA: Insufficient documentation

## 2016-05-17 NOTE — Telephone Encounter (Signed)
New message  Pt verbalized that he is returning call for rn about lab results

## 2016-05-19 ENCOUNTER — Encounter (HOSPITAL_COMMUNITY)
Admission: RE | Admit: 2016-05-19 | Discharge: 2016-05-19 | Disposition: A | Discharge status: Home / Self Care | Payer: Self-pay | Source: Ambulatory Visit | Attending: Cardiology | Admitting: Cardiology

## 2016-05-20 ENCOUNTER — Encounter (HOSPITAL_COMMUNITY)
Admission: RE | Admit: 2016-05-20 | Discharge: 2016-05-20 | Disposition: A | Discharge status: Home / Self Care | Payer: Self-pay | Source: Ambulatory Visit | Attending: Cardiology | Admitting: Cardiology

## 2016-05-24 ENCOUNTER — Encounter (HOSPITAL_COMMUNITY)
Admission: RE | Admit: 2016-05-24 | Discharge: 2016-05-24 | Disposition: A | Discharge status: Home / Self Care | Payer: Self-pay | Source: Ambulatory Visit | Attending: Cardiology | Admitting: Cardiology

## 2016-05-25 ENCOUNTER — Encounter: Payer: Self-pay | Admitting: *Deleted

## 2016-05-25 NOTE — Progress Notes (Signed)
Patient ID: [name], male   DOB: [birth year], 65 y.o.   MRN: [id number] Patient did not show up for 05/25/2016, 8:30 AM, appointment to have a 24 hour holter monitor applied.

## 2016-05-25 NOTE — Progress Notes (Signed)
Message sent to [facility] to reschedule.

## 2016-05-25 NOTE — Progress Notes (Signed)
Please call patient

## 2016-05-25 NOTE — Progress Notes (Signed)
Holter has been rescheduled 10/24.

## 2016-05-26 ENCOUNTER — Encounter (HOSPITAL_COMMUNITY)
Admission: RE | Admit: 2016-05-26 | Discharge: 2016-05-26 | Disposition: A | Discharge status: Home / Self Care | Payer: Self-pay | Source: Ambulatory Visit | Attending: Cardiology | Admitting: Cardiology

## 2016-05-27 ENCOUNTER — Encounter (HOSPITAL_COMMUNITY)
Admission: RE | Admit: 2016-05-27 | Discharge: 2016-05-27 | Disposition: A | Discharge status: Home / Self Care | Payer: Self-pay | Source: Ambulatory Visit | Attending: Cardiology | Admitting: Cardiology

## 2016-05-31 ENCOUNTER — Encounter (HOSPITAL_COMMUNITY): Payer: Self-pay

## 2016-06-02 ENCOUNTER — Encounter (HOSPITAL_COMMUNITY): Payer: Self-pay

## 2016-06-03 ENCOUNTER — Encounter (HOSPITAL_COMMUNITY): Payer: Self-pay

## 2016-06-07 ENCOUNTER — Ambulatory Visit (INDEPENDENT_AMBULATORY_CARE_PROVIDER_SITE_OTHER): Payer: Medicare Other

## 2016-06-07 ENCOUNTER — Encounter (HOSPITAL_COMMUNITY)
Admission: RE | Admit: 2016-06-07 | Discharge: 2016-06-07 | Disposition: A | Discharge status: Home / Self Care | Payer: Self-pay | Source: Ambulatory Visit | Attending: Cardiology | Admitting: Cardiology

## 2016-06-07 DIAGNOSIS — R000 Tachycardia, unspecified: Secondary | ICD-10-CM | POA: Diagnosis not present

## 2016-06-09 ENCOUNTER — Encounter (HOSPITAL_COMMUNITY)
Admission: RE | Admit: 2016-06-09 | Discharge: 2016-06-09 | Disposition: A | Discharge status: Home / Self Care | Payer: Self-pay | Source: Ambulatory Visit | Attending: Cardiology | Admitting: Cardiology

## 2016-06-10 ENCOUNTER — Encounter (HOSPITAL_COMMUNITY)
Admission: RE | Admit: 2016-06-10 | Discharge: 2016-06-10 | Disposition: A | Discharge status: Home / Self Care | Payer: Self-pay | Source: Ambulatory Visit | Attending: Cardiology | Admitting: Cardiology

## 2016-06-14 ENCOUNTER — Encounter (HOSPITAL_COMMUNITY)
Admission: RE | Admit: 2016-06-14 | Discharge: 2016-06-14 | Disposition: A | Discharge status: Home / Self Care | Payer: Self-pay | Source: Ambulatory Visit | Attending: Cardiology | Admitting: Cardiology

## 2016-06-16 ENCOUNTER — Encounter (HOSPITAL_COMMUNITY)
Admission: RE | Admit: 2016-06-16 | Discharge: 2016-06-16 | Disposition: A | Discharge status: Home / Self Care | Payer: Self-pay | Source: Ambulatory Visit | Attending: Cardiology | Admitting: Cardiology

## 2016-06-16 DIAGNOSIS — Z951 Presence of aortocoronary bypass graft: Secondary | ICD-10-CM | POA: Insufficient documentation

## 2016-06-17 ENCOUNTER — Encounter (HOSPITAL_COMMUNITY)
Admission: RE | Admit: 2016-06-17 | Discharge: 2016-06-17 | Disposition: A | Discharge status: Home / Self Care | Payer: Self-pay | Source: Ambulatory Visit | Attending: Cardiology | Admitting: Cardiology

## 2016-06-21 ENCOUNTER — Encounter (HOSPITAL_COMMUNITY)
Admission: RE | Admit: 2016-06-21 | Discharge: 2016-06-21 | Disposition: A | Discharge status: Home / Self Care | Payer: Self-pay | Source: Ambulatory Visit | Attending: Cardiology | Admitting: Cardiology

## 2016-06-23 ENCOUNTER — Encounter (HOSPITAL_COMMUNITY)
Admission: RE | Admit: 2016-06-23 | Discharge: 2016-06-23 | Disposition: A | Discharge status: Home / Self Care | Payer: Self-pay | Source: Ambulatory Visit | Attending: Cardiology | Admitting: Cardiology

## 2016-06-24 ENCOUNTER — Encounter (HOSPITAL_COMMUNITY): Payer: Self-pay

## 2016-06-28 ENCOUNTER — Encounter (HOSPITAL_COMMUNITY)
Admission: RE | Admit: 2016-06-28 | Discharge: 2016-06-28 | Disposition: A | Discharge status: Home / Self Care | Payer: Self-pay | Source: Ambulatory Visit | Attending: Cardiology | Admitting: Cardiology

## 2016-06-30 ENCOUNTER — Encounter (HOSPITAL_COMMUNITY)
Admission: RE | Admit: 2016-06-30 | Discharge: 2016-06-30 | Disposition: A | Discharge status: Home / Self Care | Payer: Self-pay | Source: Ambulatory Visit | Attending: Cardiology | Admitting: Cardiology

## 2016-07-01 ENCOUNTER — Encounter (HOSPITAL_COMMUNITY)
Admission: RE | Admit: 2016-07-01 | Discharge: 2016-07-01 | Disposition: A | Discharge status: Home / Self Care | Payer: Self-pay | Source: Ambulatory Visit | Attending: Cardiology | Admitting: Cardiology

## 2016-07-05 ENCOUNTER — Encounter (HOSPITAL_COMMUNITY)
Admission: RE | Admit: 2016-07-05 | Discharge: 2016-07-05 | Disposition: A | Discharge status: Home / Self Care | Payer: Self-pay | Source: Ambulatory Visit | Attending: Cardiology | Admitting: Cardiology

## 2016-07-06 DIAGNOSIS — E113511 Type 2 diabetes mellitus with proliferative diabetic retinopathy with macular edema, right eye: Secondary | ICD-10-CM | POA: Diagnosis not present

## 2016-07-08 ENCOUNTER — Encounter (HOSPITAL_COMMUNITY): Payer: Self-pay

## 2016-07-12 ENCOUNTER — Encounter (HOSPITAL_COMMUNITY)
Admission: RE | Admit: 2016-07-12 | Discharge: 2016-07-12 | Disposition: A | Discharge status: Home / Self Care | Payer: Self-pay | Source: Ambulatory Visit | Attending: Cardiology | Admitting: Cardiology

## 2016-07-14 ENCOUNTER — Encounter (HOSPITAL_COMMUNITY)
Admission: RE | Admit: 2016-07-14 | Discharge: 2016-07-14 | Disposition: A | Discharge status: Home / Self Care | Payer: Self-pay | Source: Ambulatory Visit | Attending: Cardiology | Admitting: Cardiology

## 2016-07-15 ENCOUNTER — Encounter (HOSPITAL_COMMUNITY)
Admission: RE | Admit: 2016-07-15 | Discharge: 2016-07-15 | Disposition: A | Discharge status: Home / Self Care | Payer: Self-pay | Source: Ambulatory Visit | Attending: Cardiology | Admitting: Cardiology

## 2016-07-15 DIAGNOSIS — I2510 Atherosclerotic heart disease of native coronary artery without angina pectoris: Secondary | ICD-10-CM | POA: Insufficient documentation

## 2016-07-19 ENCOUNTER — Encounter (HOSPITAL_COMMUNITY)
Admission: RE | Admit: 2016-07-19 | Discharge: 2016-07-19 | Disposition: A | Discharge status: Home / Self Care | Payer: Self-pay | Source: Ambulatory Visit | Attending: Cardiology | Admitting: Cardiology

## 2016-07-21 ENCOUNTER — Encounter (HOSPITAL_COMMUNITY)
Admission: RE | Admit: 2016-07-21 | Discharge: 2016-07-21 | Disposition: A | Discharge status: Home / Self Care | Payer: Self-pay | Source: Ambulatory Visit | Attending: Cardiology | Admitting: Cardiology

## 2016-07-22 ENCOUNTER — Encounter (HOSPITAL_COMMUNITY)
Admission: RE | Admit: 2016-07-22 | Discharge: 2016-07-22 | Disposition: A | Discharge status: Home / Self Care | Payer: Self-pay | Source: Ambulatory Visit | Attending: Cardiology | Admitting: Cardiology

## 2016-07-26 ENCOUNTER — Encounter (HOSPITAL_COMMUNITY): Payer: Self-pay

## 2016-07-26 DIAGNOSIS — I10 Essential (primary) hypertension: Secondary | ICD-10-CM | POA: Diagnosis not present

## 2016-07-26 DIAGNOSIS — I2510 Atherosclerotic heart disease of native coronary artery without angina pectoris: Secondary | ICD-10-CM | POA: Diagnosis not present

## 2016-07-26 DIAGNOSIS — E1122 Type 2 diabetes mellitus with diabetic chronic kidney disease: Secondary | ICD-10-CM | POA: Diagnosis not present

## 2016-07-26 DIAGNOSIS — G4700 Insomnia, unspecified: Secondary | ICD-10-CM | POA: Diagnosis not present

## 2016-07-26 DIAGNOSIS — Z794 Long term (current) use of insulin: Secondary | ICD-10-CM | POA: Diagnosis not present

## 2016-07-26 DIAGNOSIS — E7800 Pure hypercholesterolemia, unspecified: Secondary | ICD-10-CM | POA: Diagnosis not present

## 2016-07-26 DIAGNOSIS — Z23 Encounter for immunization: Secondary | ICD-10-CM | POA: Diagnosis not present

## 2016-07-26 DIAGNOSIS — N183 Chronic kidney disease, stage 3 (moderate): Secondary | ICD-10-CM | POA: Diagnosis not present

## 2016-07-27 ENCOUNTER — Encounter (HOSPITAL_COMMUNITY)
Admission: RE | Admit: 2016-07-27 | Discharge: 2016-07-27 | Disposition: A | Discharge status: Home / Self Care | Payer: Self-pay | Source: Ambulatory Visit | Attending: Cardiology | Admitting: Cardiology

## 2016-07-28 ENCOUNTER — Encounter (HOSPITAL_COMMUNITY)
Admission: RE | Admit: 2016-07-28 | Discharge: 2016-07-28 | Disposition: A | Discharge status: Home / Self Care | Payer: Self-pay | Source: Ambulatory Visit | Attending: Cardiology | Admitting: Cardiology

## 2016-07-29 ENCOUNTER — Encounter (HOSPITAL_COMMUNITY): Payer: Self-pay

## 2016-07-29 DIAGNOSIS — E1165 Type 2 diabetes mellitus with hyperglycemia: Secondary | ICD-10-CM | POA: Diagnosis not present

## 2016-07-29 DIAGNOSIS — E7800 Pure hypercholesterolemia, unspecified: Secondary | ICD-10-CM | POA: Diagnosis not present

## 2016-07-29 DIAGNOSIS — I10 Essential (primary) hypertension: Secondary | ICD-10-CM | POA: Diagnosis not present

## 2016-07-29 DIAGNOSIS — N189 Chronic kidney disease, unspecified: Secondary | ICD-10-CM | POA: Diagnosis not present

## 2016-08-02 ENCOUNTER — Encounter (HOSPITAL_COMMUNITY)
Admission: RE | Admit: 2016-08-02 | Discharge: 2016-08-02 | Disposition: A | Discharge status: Home / Self Care | Payer: Self-pay | Source: Ambulatory Visit | Attending: Cardiology | Admitting: Cardiology

## 2016-08-04 ENCOUNTER — Encounter (HOSPITAL_COMMUNITY)
Admission: RE | Admit: 2016-08-04 | Discharge: 2016-08-04 | Disposition: A | Discharge status: Home / Self Care | Payer: Self-pay | Source: Ambulatory Visit | Attending: Cardiology | Admitting: Cardiology

## 2016-08-05 ENCOUNTER — Encounter (HOSPITAL_COMMUNITY)
Admission: RE | Admit: 2016-08-05 | Discharge: 2016-08-05 | Disposition: A | Discharge status: Home / Self Care | Payer: Self-pay | Source: Ambulatory Visit | Attending: Cardiology | Admitting: Cardiology

## 2016-08-09 ENCOUNTER — Encounter (HOSPITAL_COMMUNITY)
Admission: RE | Admit: 2016-08-09 | Discharge: 2016-08-09 | Disposition: A | Discharge status: Home / Self Care | Payer: Self-pay | Source: Ambulatory Visit | Attending: Cardiology | Admitting: Cardiology

## 2016-08-11 ENCOUNTER — Encounter (HOSPITAL_COMMUNITY)
Admission: RE | Admit: 2016-08-11 | Discharge: 2016-08-11 | Disposition: A | Discharge status: Home / Self Care | Payer: Self-pay | Source: Ambulatory Visit | Attending: Cardiology | Admitting: Cardiology

## 2016-08-12 ENCOUNTER — Encounter (HOSPITAL_COMMUNITY)
Admission: RE | Admit: 2016-08-12 | Discharge: 2016-08-12 | Disposition: A | Discharge status: Home / Self Care | Payer: Self-pay | Source: Ambulatory Visit | Attending: Cardiology | Admitting: Cardiology

## 2016-08-16 ENCOUNTER — Encounter (HOSPITAL_COMMUNITY)
Admission: RE | Admit: 2016-08-16 | Discharge: 2016-08-16 | Disposition: A | Discharge status: Home / Self Care | Payer: Self-pay | Source: Ambulatory Visit | Attending: Cardiology | Admitting: Cardiology

## 2016-08-16 DIAGNOSIS — I2510 Atherosclerotic heart disease of native coronary artery without angina pectoris: Secondary | ICD-10-CM | POA: Insufficient documentation

## 2016-08-18 ENCOUNTER — Encounter (HOSPITAL_COMMUNITY)
Admission: RE | Admit: 2016-08-18 | Discharge: 2016-08-18 | Disposition: A | Discharge status: Home / Self Care | Payer: Self-pay | Source: Ambulatory Visit | Attending: Cardiology | Admitting: Cardiology

## 2016-08-19 ENCOUNTER — Encounter (HOSPITAL_COMMUNITY)
Admission: RE | Admit: 2016-08-19 | Discharge: 2016-08-19 | Disposition: A | Discharge status: Home / Self Care | Payer: Self-pay | Source: Ambulatory Visit | Attending: Cardiology | Admitting: Cardiology

## 2016-08-23 ENCOUNTER — Encounter (HOSPITAL_COMMUNITY)
Admission: RE | Admit: 2016-08-23 | Discharge: 2016-08-23 | Disposition: A | Discharge status: Home / Self Care | Payer: Self-pay | Source: Ambulatory Visit | Attending: Cardiology | Admitting: Cardiology

## 2016-08-25 ENCOUNTER — Encounter (HOSPITAL_COMMUNITY)
Admission: RE | Admit: 2016-08-25 | Discharge: 2016-08-25 | Disposition: A | Discharge status: Home / Self Care | Payer: Self-pay | Source: Ambulatory Visit | Attending: Cardiology | Admitting: Cardiology

## 2016-08-26 ENCOUNTER — Encounter (HOSPITAL_COMMUNITY)
Admission: RE | Admit: 2016-08-26 | Discharge: 2016-08-26 | Disposition: A | Discharge status: Home / Self Care | Payer: Self-pay | Source: Ambulatory Visit | Attending: Cardiology | Admitting: Cardiology

## 2016-08-30 ENCOUNTER — Encounter (HOSPITAL_COMMUNITY)
Admission: RE | Admit: 2016-08-30 | Discharge: 2016-08-30 | Disposition: A | Discharge status: Home / Self Care | Payer: Self-pay | Source: Ambulatory Visit | Attending: Cardiology | Admitting: Cardiology

## 2016-09-01 ENCOUNTER — Encounter (HOSPITAL_COMMUNITY): Payer: Self-pay

## 2016-09-02 ENCOUNTER — Encounter (HOSPITAL_COMMUNITY)
Admission: RE | Admit: 2016-09-02 | Discharge: 2016-09-02 | Disposition: A | Discharge status: Home / Self Care | Payer: Self-pay | Source: Ambulatory Visit | Attending: Cardiology | Admitting: Cardiology

## 2016-09-05 DIAGNOSIS — E1165 Type 2 diabetes mellitus with hyperglycemia: Secondary | ICD-10-CM | POA: Diagnosis not present

## 2016-09-06 ENCOUNTER — Encounter (HOSPITAL_COMMUNITY)
Admission: RE | Admit: 2016-09-06 | Discharge: 2016-09-06 | Disposition: A | Discharge status: Home / Self Care | Payer: Self-pay | Source: Ambulatory Visit | Attending: Cardiology | Admitting: Cardiology

## 2016-09-08 ENCOUNTER — Encounter (HOSPITAL_COMMUNITY)
Admission: RE | Admit: 2016-09-08 | Discharge: 2016-09-08 | Disposition: A | Discharge status: Home / Self Care | Payer: Self-pay | Source: Ambulatory Visit | Attending: Cardiology | Admitting: Cardiology

## 2016-09-09 ENCOUNTER — Encounter (HOSPITAL_COMMUNITY)
Admission: RE | Admit: 2016-09-09 | Discharge: 2016-09-09 | Disposition: A | Discharge status: Home / Self Care | Payer: Self-pay | Source: Ambulatory Visit | Attending: Cardiology | Admitting: Cardiology

## 2016-09-13 ENCOUNTER — Encounter (HOSPITAL_COMMUNITY)
Admission: RE | Admit: 2016-09-13 | Discharge: 2016-09-13 | Disposition: A | Discharge status: Home / Self Care | Payer: Self-pay | Source: Ambulatory Visit | Attending: Cardiology | Admitting: Cardiology

## 2016-09-14 DIAGNOSIS — E113511 Type 2 diabetes mellitus with proliferative diabetic retinopathy with macular edema, right eye: Secondary | ICD-10-CM | POA: Diagnosis not present

## 2016-09-15 ENCOUNTER — Encounter (HOSPITAL_COMMUNITY)
Admission: RE | Admit: 2016-09-15 | Discharge: 2016-09-15 | Disposition: A | Discharge status: Home / Self Care | Payer: Self-pay | Source: Ambulatory Visit | Attending: Cardiology | Admitting: Cardiology

## 2016-09-15 DIAGNOSIS — I2510 Atherosclerotic heart disease of native coronary artery without angina pectoris: Secondary | ICD-10-CM | POA: Insufficient documentation

## 2016-09-16 ENCOUNTER — Encounter (HOSPITAL_COMMUNITY)
Admission: RE | Admit: 2016-09-16 | Discharge: 2016-09-16 | Disposition: A | Discharge status: Home / Self Care | Payer: Self-pay | Source: Ambulatory Visit | Attending: Cardiology | Admitting: Cardiology

## 2016-09-20 ENCOUNTER — Encounter (HOSPITAL_COMMUNITY)
Admission: RE | Admit: 2016-09-20 | Discharge: 2016-09-20 | Disposition: A | Discharge status: Home / Self Care | Payer: Self-pay | Source: Ambulatory Visit | Attending: Cardiology | Admitting: Cardiology

## 2016-09-22 ENCOUNTER — Encounter (HOSPITAL_COMMUNITY)
Admission: RE | Admit: 2016-09-22 | Discharge: 2016-09-22 | Disposition: A | Discharge status: Home / Self Care | Payer: Self-pay | Source: Ambulatory Visit | Attending: Cardiology | Admitting: Cardiology

## 2016-09-23 ENCOUNTER — Encounter (HOSPITAL_COMMUNITY)
Admission: RE | Admit: 2016-09-23 | Discharge: 2016-09-23 | Disposition: A | Discharge status: Home / Self Care | Payer: Self-pay | Source: Ambulatory Visit | Attending: Cardiology | Admitting: Cardiology

## 2016-09-27 ENCOUNTER — Encounter (HOSPITAL_COMMUNITY)
Admission: RE | Admit: 2016-09-27 | Discharge: 2016-09-27 | Disposition: A | Discharge status: Home / Self Care | Payer: Self-pay | Source: Ambulatory Visit | Attending: Cardiology | Admitting: Cardiology

## 2016-09-29 ENCOUNTER — Encounter (HOSPITAL_COMMUNITY)
Admission: RE | Admit: 2016-09-29 | Discharge: 2016-09-29 | Disposition: A | Discharge status: Home / Self Care | Payer: Self-pay | Source: Ambulatory Visit | Attending: Cardiology | Admitting: Cardiology

## 2016-09-30 ENCOUNTER — Encounter (HOSPITAL_COMMUNITY)
Admission: RE | Admit: 2016-09-30 | Discharge: 2016-09-30 | Disposition: A | Discharge status: Home / Self Care | Payer: Self-pay | Source: Ambulatory Visit | Attending: Cardiology | Admitting: Cardiology

## 2016-10-04 ENCOUNTER — Encounter (HOSPITAL_COMMUNITY)
Admission: RE | Admit: 2016-10-04 | Discharge: 2016-10-04 | Disposition: A | Discharge status: Home / Self Care | Payer: Self-pay | Source: Ambulatory Visit | Attending: Cardiology | Admitting: Cardiology

## 2016-10-06 ENCOUNTER — Encounter (HOSPITAL_COMMUNITY)
Admission: RE | Admit: 2016-10-06 | Discharge: 2016-10-06 | Disposition: A | Discharge status: Home / Self Care | Payer: Self-pay | Source: Ambulatory Visit | Attending: Cardiology | Admitting: Cardiology

## 2016-10-07 ENCOUNTER — Encounter (HOSPITAL_COMMUNITY)
Admission: RE | Admit: 2016-10-07 | Discharge: 2016-10-07 | Disposition: A | Discharge status: Home / Self Care | Payer: Self-pay | Source: Ambulatory Visit | Attending: Cardiology | Admitting: Cardiology

## 2016-10-11 ENCOUNTER — Encounter (HOSPITAL_COMMUNITY)
Admission: RE | Admit: 2016-10-11 | Discharge: 2016-10-11 | Disposition: A | Discharge status: Home / Self Care | Payer: Self-pay | Source: Ambulatory Visit | Attending: Cardiology | Admitting: Cardiology

## 2016-10-13 ENCOUNTER — Encounter (HOSPITAL_COMMUNITY)
Admission: RE | Admit: 2016-10-13 | Discharge: 2016-10-13 | Disposition: A | Discharge status: Home / Self Care | Payer: Medicare Other | Source: Ambulatory Visit | Attending: Cardiology | Admitting: Cardiology

## 2016-10-13 DIAGNOSIS — I2510 Atherosclerotic heart disease of native coronary artery without angina pectoris: Secondary | ICD-10-CM | POA: Insufficient documentation

## 2016-10-14 ENCOUNTER — Encounter (HOSPITAL_COMMUNITY)
Admission: RE | Admit: 2016-10-14 | Discharge: 2016-10-14 | Disposition: A | Discharge status: Home / Self Care | Payer: Medicare Other | Source: Ambulatory Visit | Attending: Cardiology | Admitting: Cardiology

## 2016-10-18 ENCOUNTER — Encounter (HOSPITAL_COMMUNITY)
Admission: RE | Admit: 2016-10-18 | Discharge: 2016-10-18 | Disposition: A | Discharge status: Home / Self Care | Payer: Medicare Other | Source: Ambulatory Visit | Attending: Cardiology | Admitting: Cardiology

## 2016-10-20 ENCOUNTER — Encounter (HOSPITAL_COMMUNITY)
Admission: RE | Admit: 2016-10-20 | Discharge: 2016-10-20 | Disposition: A | Discharge status: Home / Self Care | Payer: Medicare Other | Source: Ambulatory Visit | Attending: Cardiology | Admitting: Cardiology

## 2016-10-21 ENCOUNTER — Encounter (HOSPITAL_COMMUNITY)
Admission: RE | Admit: 2016-10-21 | Discharge: 2016-10-21 | Disposition: A | Discharge status: Home / Self Care | Payer: Medicare Other | Source: Ambulatory Visit | Attending: Cardiology | Admitting: Cardiology

## 2016-10-25 ENCOUNTER — Encounter (HOSPITAL_COMMUNITY)
Admission: RE | Admit: 2016-10-25 | Discharge: 2016-10-25 | Disposition: A | Discharge status: Home / Self Care | Payer: Medicare Other | Source: Ambulatory Visit | Attending: Cardiology | Admitting: Cardiology

## 2016-10-26 ENCOUNTER — Ambulatory Visit (HOSPITAL_COMMUNITY): Payer: Medicare Other | Attending: Cardiovascular Disease

## 2016-10-26 ENCOUNTER — Encounter (HOSPITAL_COMMUNITY): Payer: Medicare Other

## 2016-10-26 ENCOUNTER — Other Ambulatory Visit: Payer: Self-pay

## 2016-10-26 DIAGNOSIS — R9439 Abnormal result of other cardiovascular function study: Secondary | ICD-10-CM | POA: Insufficient documentation

## 2016-10-26 DIAGNOSIS — I517 Cardiomegaly: Secondary | ICD-10-CM | POA: Diagnosis not present

## 2016-10-26 DIAGNOSIS — I340 Nonrheumatic mitral (valve) insufficiency: Secondary | ICD-10-CM | POA: Insufficient documentation

## 2016-10-26 DIAGNOSIS — I350 Nonrheumatic aortic (valve) stenosis: Secondary | ICD-10-CM | POA: Diagnosis not present

## 2016-10-26 DIAGNOSIS — I361 Nonrheumatic tricuspid (valve) insufficiency: Secondary | ICD-10-CM | POA: Insufficient documentation

## 2016-10-27 ENCOUNTER — Encounter (HOSPITAL_COMMUNITY)
Admission: RE | Admit: 2016-10-27 | Discharge: 2016-10-27 | Disposition: A | Discharge status: Home / Self Care | Payer: Medicare Other | Source: Ambulatory Visit | Attending: Cardiology | Admitting: Cardiology

## 2016-10-28 ENCOUNTER — Encounter (HOSPITAL_COMMUNITY)
Admission: RE | Admit: 2016-10-28 | Discharge: 2016-10-28 | Disposition: A | Discharge status: Home / Self Care | Payer: Medicare Other | Source: Ambulatory Visit | Attending: Cardiology | Admitting: Cardiology

## 2016-11-01 ENCOUNTER — Encounter (HOSPITAL_COMMUNITY)
Admission: RE | Admit: 2016-11-01 | Discharge: 2016-11-01 | Disposition: A | Discharge status: Home / Self Care | Payer: Medicare Other | Source: Ambulatory Visit | Attending: Cardiology | Admitting: Cardiology

## 2016-11-01 ENCOUNTER — Telehealth: Payer: Self-pay | Admitting: Cardiology

## 2016-11-01 NOTE — Telephone Encounter (Signed)
°   New message    pt verbalized that he is calling to speak to rn    He said that RN called him yesterday about Echo results  Pt said he is in the area and he would like then printed

## 2016-11-01 NOTE — Telephone Encounter (Signed)
Left message to call back  

## 2016-11-02 ENCOUNTER — Telehealth: Payer: Self-pay | Admitting: Cardiology

## 2016-11-02 NOTE — Telephone Encounter (Signed)
Left message to call back  

## 2016-11-02 NOTE — Telephone Encounter (Signed)
Walk In pt Form-pt wants Echo results. Placed in [place] box.

## 2016-11-02 NOTE — Telephone Encounter (Signed)
Called patient to review ECHO results and to schedule follow-up appointment to discuss.  Left message to call back.

## 2016-11-02 NOTE — Telephone Encounter (Signed)
[name] is returning your call .  Thanks

## 2016-11-03 ENCOUNTER — Encounter (HOSPITAL_COMMUNITY)
Admission: RE | Admit: 2016-11-03 | Discharge: 2016-11-03 | Disposition: A | Discharge status: Home / Self Care | Payer: Medicare Other | Source: Ambulatory Visit | Attending: Cardiology | Admitting: Cardiology

## 2016-11-03 NOTE — Telephone Encounter (Signed)
Patient walked in to clinic to discuss results. Gave patient results and scheduled patient for follow-up appointment 3/30 with B. Rosita Fire, PA to discuss further. Printed out MyChart activation instructions for patient. He was grateful for assistance.

## 2016-11-03 NOTE — Telephone Encounter (Signed)
-----   Message from [name], MD sent at 10/30/2016  3:13 PM EDT ----- I wold like him to come in with me or with an extender to discuss the results and talk about setting up for a right and left heart cath.  I have reviewed his echo and clearly he appears, visually, to have severe AS and likely has low flow low gradient AS.  Although is LV dimensions are still normal, his EF has started to decline which could be from significant AS.

## 2016-11-04 ENCOUNTER — Encounter (HOSPITAL_COMMUNITY)
Admission: RE | Admit: 2016-11-04 | Discharge: 2016-11-04 | Disposition: A | Discharge status: Home / Self Care | Payer: Medicare Other | Source: Ambulatory Visit | Attending: Cardiology | Admitting: Cardiology

## 2016-11-08 ENCOUNTER — Encounter (HOSPITAL_COMMUNITY)
Admission: RE | Admit: 2016-11-08 | Discharge: 2016-11-08 | Disposition: A | Discharge status: Home / Self Care | Payer: Medicare Other | Source: Ambulatory Visit | Attending: Cardiology | Admitting: Cardiology

## 2016-11-09 DIAGNOSIS — E113513 Type 2 diabetes mellitus with proliferative diabetic retinopathy with macular edema, bilateral: Secondary | ICD-10-CM | POA: Diagnosis not present

## 2016-11-10 ENCOUNTER — Encounter (HOSPITAL_COMMUNITY)
Admission: RE | Admit: 2016-11-10 | Discharge: 2016-11-10 | Disposition: A | Discharge status: Home / Self Care | Payer: Medicare Other | Source: Ambulatory Visit | Attending: Cardiology | Admitting: Cardiology

## 2016-11-11 ENCOUNTER — Encounter (HOSPITAL_COMMUNITY): Payer: Medicare Other

## 2016-11-11 ENCOUNTER — Encounter: Payer: Self-pay | Admitting: Cardiology

## 2016-11-11 ENCOUNTER — Ambulatory Visit (INDEPENDENT_AMBULATORY_CARE_PROVIDER_SITE_OTHER): Payer: Medicare Other | Admitting: Cardiology

## 2016-11-11 VITALS — BP 136/84 | HR 97 | Ht 71.0 in | Wt 189.0 lb

## 2016-11-11 DIAGNOSIS — I350 Nonrheumatic aortic (valve) stenosis: Secondary | ICD-10-CM

## 2016-11-11 DIAGNOSIS — I429 Cardiomyopathy, unspecified: Secondary | ICD-10-CM | POA: Insufficient documentation

## 2016-11-11 DIAGNOSIS — I2510 Atherosclerotic heart disease of native coronary artery without angina pectoris: Secondary | ICD-10-CM | POA: Diagnosis not present

## 2016-11-11 LAB — BASIC METABOLIC PANEL
BUN/Creatinine Ratio: 16 (ref 10–24)
BUN: 21 mg/dL (ref 8–27)
CALCIUM: 9.3 mg/dL (ref 8.6–10.2)
CHLORIDE: 105 mmol/L (ref 96–106)
CO2: 27 mmol/L (ref 18–29)
CREATININE: 1.33 mg/dL — AB (ref 0.76–1.27)
GFR calc Af Amer: 63 mL/min/{1.73_m2} (ref >59)
GFR, EST NON AFRICAN AMERICAN: 55 mL/min/{1.73_m2} — AB (ref >59)
Glucose: 122 mg/dL — ABNORMAL HIGH (ref 65–99)
Potassium: 4.2 mmol/L (ref 3.5–5.2)
Sodium: 140 mmol/L (ref 134–144)

## 2016-11-11 NOTE — Patient Instructions (Signed)
Medication Instructions:  Your physician recommends that you continue on your current medications as directed. Please refer to the Current Medication list given to you today.   Labwork: LABS TODAY: CBC, PT/INR, STAT BMET  Testing/Procedures: Your physician has requested that you have a cardiac catheterization. Cardiac catheterization is used to diagnose and/or treat various heart conditions. Doctors may recommend this procedure for a number of different reasons. The most common reason is to evaluate chest pain. Chest pain can be a symptom of coronary artery disease (CAD), and cardiac catheterization can show whether plaque is narrowing or blocking your heart's arteries. This procedure is also used to evaluate the valves, as well as measure the blood flow and oxygen levels in different parts of your heart. For further information please visit [facility]. Please follow instruction sheet, as given.    Follow-Up: Your physician recommends that you schedule a follow-up appointment with Dr. Radford Pax after Cardiac Catheterization.   Any Other Special Instructions Will Be Listed Below (If Applicable).    [facility] [address] [place] [address] [place] [address] Dept: [phone] Loc: [phone]  [name]  11/11/2016  You are scheduled for a Cardiac Catheterization on Tuesday, April 3 with Dr. Glenetta Hew.  1. Please arrive at the [facility] (Main Entrance A) at [facility]: [place], [place] [address] at 8:30 AM (two hours before your procedure to ensure your preparation). Free valet parking service is available.   Special note: Every effort is made to have your procedure done on time. Please understand that emergencies sometimes delay scheduled procedures.  2. Diet: Do not eat or drink anything after midnight prior to your procedure except sips of water to take  medications.  3. Labs: None needed. Labs drawn in clinic on 11/11/16.  4. Medication instructions in preparation for your procedure:  STOP metformin 2 days prior to your procedure and restart 2 days after your procedure.  Take 1/2 dose of insulin the night before your procedure and do NOT take morning insulin the day of your procedure.  On the morning of your procedure, take any morning medicines NOT listed above.  You may use sips of water.  5. Plan for one night stay--bring personal belongings. 6. Bring a current list of your medications and current insurance cards. 7. You MUST have a responsible person to drive you home. 8. Someone MUST be with you the first 24 hours after you arrive home or your discharge will be delayed. 9. Please wear clothes that are easy to get on and off and wear slip-on shoes.  Thank you for allowing [place] to care for you!   -- [facility]  Coronary Angiogram A coronary angiogram is an X-ray procedure that is used to examine the arteries in the heart. In this procedure, a dye (contrast dye) is injected through a long, thin tube (catheter). The catheter is inserted through the groin, wrist, or arm. The dye is injected into each artery, then X-rays are taken to show if there is a blockage in the arteries of the heart. This procedure can also show if you have valve disease or a disease of the aorta, and it can be used to check the overall function of your heart muscle. You may have a coronary angiogram if: You are having chest pain, or other symptoms of angina, and you are at risk for heart disease. You have an abnormal  electrocardiogram (ECG) or stress test. You have chest pain and heart failure. You are having irregular heart rhythms. You and your health care provider determine that the benefits of the test information outweigh the risks of the procedure. Let your health care provider know about: Any allergies you have, including  allergies to contrast dye. All medicines you are taking, including vitamins, herbs, eye drops, creams, and over-the-counter medicines. Any problems you or family members have had with anesthetic medicines. Any blood disorders you have. Any surgeries you have had. History of kidney problems or kidney failure. Any medical conditions you have. Whether you are pregnant or may be pregnant. What are the risks? Generally, this is a safe procedure. However, problems may occur, including: Infection. Allergic reaction to medicines or dyes that are used. Bleeding from the access site or other locations. Kidney injury, especially in people with impaired kidney function. Stroke (rare). Heart attack (rare). Damage to other structures or organs. What happens before the procedure? Staying hydrated  Follow instructions from your health care provider about hydration, which may include: Up to 2 hours before the procedure - you may continue to drink clear liquids, such as water, clear fruit juice, black coffee, and plain tea. Eating and drinking restrictions  Follow instructions from your health care provider about eating and drinking, which may include: 8 hours before the procedure - stop eating heavy meals or foods such as meat, fried foods, or fatty foods. 6 hours before the procedure - stop eating light meals or foods, such as toast or cereal. 2 hours before the procedure - stop drinking clear liquids. General instructions  Ask your health care provider about: Changing or stopping your regular medicines. This is especially important if you are taking diabetes medicines or blood thinners. Taking medicines such as ibuprofen. These medicines can thin your blood. Do not take these medicines before your procedure if your health care provider instructs you not to, though aspirin may be recommended prior to coronary angiograms. Plan to have someone take you home from the hospital or clinic. You may need to  have blood tests or X-rays done. What happens during the procedure? An IV tube will be inserted into one of your veins. You will be given one or more of the following: A medicine to help you relax (sedative). A medicine to numb the area where the catheter will be inserted into an artery (local anesthetic). To reduce your risk of infection: Your health care team will wash or sanitize their hands. Your skin will be washed with soap. Hair may be removed from the area where the catheter will be inserted. You will be connected to a continuous ECG monitor. The catheter will be inserted into an artery. The location may be in your groin, in your wrist, or in the fold of your arm (near your elbow). A type of X-ray (fluoroscopy) will be used to help guide the catheter to the opening of the blood vessel that is being examined. A dye will be injected into the catheter, and X-rays will be taken. The dye will help to show where any narrowing or blockages are located in the heart arteries. Tell your health care provider if you have any chest pain or trouble breathing during the procedure. If blockages are found, your health care provider may perform another procedure, such as inserting a coronary stent. The procedure may vary among health care providers and hospitals. What happens after the procedure? After the procedure, you will need to keep the area  still for a few hours, or for as long as told by your health care provider. If the procedure is done through the groin, you will be instructed to not bend and not cross your legs. The insertion site will be checked frequently. The pulse in your foot or wrist will be checked frequently. You may have additional blood tests, X-rays, and a test that records the electrical activity of your heart (ECG). Do not drive for 24 hours if you were given a sedative. Summary A coronary angiogram is an X-ray procedure that is used to look into the arteries in the  heart. During the procedure, a dye (contrast dye) is injected through a long, thin tube (catheter). The catheter is inserted through the groin, wrist, or arm. Tell your health care provider about any allergies you have, including allergies to contrast dye. After the procedure, you will need to keep the area still for a few hours, or for as long as told by your health care provider. This information is not intended to replace advice given to you by your health care provider. Make sure you discuss any questions you have with your health care provider. Document Released: 02/05/2003 Document Revised: 05/13/2016 Document Reviewed: 05/13/2016 Elsevier Interactive Patient Education  2017 [employer]  Aortic Valve Stenosis Aortic valve stenosis is a narrowing of the aortic valve. The aortic valve opens and closes to regulate blood flow between the lower left chamber of the heart (left ventricle) and the blood vessel that leads away from the heart (aorta). When the aortic valve becomes narrow, it makes it difficult for the heart to pump blood into the aorta, which causes the heart to work harder. The extra work can weaken the heart over time. Aortic valve stenosis can range from mild to severe. If untreated, it can become more severe over time and can lead to heart failure. What are the causes? This condition may be caused by:  Buildup of calcium around and on the valve. This can occur with aging. This is the most common cause of aortic valve stenosis.  Birth defect.  Rheumatic fever.  Radiation to the chest. What increases the risk? You may be more likely to develop this condition if:  You are over the age of 68.  You were born with an abnormal bicuspid valve. What are the signs or symptoms? You may have no symptoms until your condition becomes severe. It may take 10-20 years for mild or moderate aortic valve stenosis to become severe. Symptoms may include:  Shortness of breath. This may get  worse during physical activity.  Feeling unusually weak and tired (fatigue).  Extreme discomfort in the chest, neck, or arm (angina).  A heartbeat that is irregular or faster than normal (palpitations).  Dizziness or fainting. This may happen when you get physically tired or after you take certain heart medicines, such as nitroglycerin. How is this diagnosed? This condition may be diagnosed with:  A physical exam.  Echocardiogram. This is a type of imaging test that uses sound waves (ultrasound) to make an image of your heart. There are two types that may be used:  Transthoracic echocardiogram (TTE). This type of echocardiogram is noninvasive, and it is usually done first.  Transesophageal echocardiogram (TEE). This type of echocardiogram is done by passing a flexible tube down your esophagus. The heart and the esophagus are close to each other, so your health care provider can take very clear, detailed pictures of the heart using this type of test.  Cardiac catheterization. In this procedure, a thin, flexible tube (catheter) is passed through a large vein in your neck, groin, or arm. This procedure provides information about arteries, structures, blood pressure, and oxygen levels in your heart.  Electrocardiogram (ECG). This records the electrical impulses of your heart and assesses heart function.  Stress tests. These are tests that evaluate the blood supply to your heart and your heart's response to exercise.  Blood tests. You may work with a health care provider who specializes in the heart (cardiologist). How is this treated? Treatment depends on how severe your condition is and what your symptoms are. You will need to have your heart checked regularly to make sure that your condition is not getting worse or causing serious problems. If your condition is mild, no treatment may be needed. Treatment may include:  Medicines that help keep your heart rate regular.  Medicines that  thin your blood (anticoagulants) to prevent the formation of blood clots.  Antibiotic medicines to help prevent infection.  Surgery to replace your aortic valve. This is the most common treatment for aortic valve stenosis. Several types of surgeries are available. The surgery may be done through a large incision over your heart (open heart surgery), or it may be done using a minimally invasive technique (transcatheter aortic valve replacement, or TAVR). Follow these instructions at home: Lifestyle    Limit alcohol intake to no more than 1 drink per day for nonpregnant women and 2 drinks per day for men. One drink equals 12 oz of beer, 5 oz of wine, or 1 oz of hard liquor.  Do not use any tobacco products, such as cigarettes, chewing tobacco, or e-cigarettes. If you need help quitting, ask your health care provider.  Work with your health care provider to manage your blood pressure and cholesterol.  Maintain a healthy weight. Eating and drinking   Follow instructions from your health care provider about eating or drinking restrictions.  Limit how much caffeine you drink. Caffeine can affect your heart's rate and rhythm.  Drink enough fluid to keep your urine clear or pale yellow.  Eat a heart-healthy diet. This should include plenty of fresh fruits and vegetables. If you eat meat, it should be lean cuts. Avoid foods that are:  High in salt, saturated fat, or sugar.  Canned or highly processed.  [place] Pro. Activity   Return to your normal activities as told by your health care provider. Ask your health care provider what activities are safe for you.  Exercise regularly, as told by your health care provider. Ask your health care provider what types of exercise are safe for you.  If your aortic valve stenosis is mild, you may need to avoid only very intense physical activity. The more severe your aortic valve stenosis is, the more activities you may need to avoid. General instructions    Take over-the-counter and prescription medicines only as told by your health care provider.  If you are a woman and you plan to become pregnant, talk with your health care provider before you become pregnant.  Tell all health care providers who care for you that you have aortic valve stenosis.  Keep all follow-up visits as told by your health care provider. This is important. Contact a health care provider if:  You have a fever. Get help right away if:  You develop chest pain or tightness.  You develop shortness of breath or difficulty breathing.  You feel light-headed.  You feel like you might faint.  Your heartbeat is irregular or faster than normal. These symptoms may represent a serious problem that is an emergency. Do not wait to see if the symptoms will go away. Get medical help right away. Call your local emergency services (911 in the [place] Do not drive yourself to the hospital. This information is not intended to replace advice given to you by your health care provider. Make sure you discuss any questions you have with your health care provider. Document Released: 04/30/2003 Document Revised: 01/07/2016 Document Reviewed: 07/05/2015 Elsevier Interactive Patient Education  2017 [employer].    If you need a refill on your cardiac medications before your next appointment, please call your pharmacy.

## 2016-11-11 NOTE — Progress Notes (Signed)
11/11/2016 [name]   11-12-1950  [id number]  Primary Physician Bill Amel, MD Primary Cardiologist: Dr. Radford [name]    Reason for Visit/CC: F/u for Aortic Stenosis, Systolic HF; Discuss Indication for [facility]  HPI:  [name] returns to clinic today as advised by his primary cardiologist Dr. Radford [name] to discuss results of his recent echocardiogram as well as discuss indication, procedure details and risk of recommended right and left heart catheterization.  He is a 66 year old African-American male with a history of CAD. He suffered a non-STEMI in 2013 treated with PCI of the LCx/OM. LHC in 5/14 demonstrated severe 2 vessel CAD with critical in-stent restenosis in the LCx. He underwent CABG (SVG-PDA, SVG-LCx). He also has a history of aortic stenosis. Echo in 11/15 with normal LV function and mild aortic stenosis with a mean gradient of 11 mmHg and mild to moderate AI. He was [name], [place] in March 2017 and was complaining of SOB.  ETT showed no ischemia and echo showed normal LVF with mild AS and moderate AR which was stable from 2015.   He was last seen by Dr. Radford [name] on 05/16/2016. He was doing well at that visit without any complaints of chest pain or dyspnea. Dr. Radford [name] recommended that he undergo repeat echocardiogram March 2018 to assess for progression of his aortic valve disease. Echocardiogram was performed 10/26/2016. This demonstrated notable reduction in left ventricular systolic function with a new ejection fraction of 35-40% with diffuse hypokinesis. Visualization of his  aortic valve revealed restricted mobility. There was mild stenosis with moderate regurgitation. Gradients weren't significantly uncharged. Mean gradient 16 mm Hg. However there is concern for low flow, low gradient severe aortic stenosis. Dr. Radford [name] has recommended [facility].   The patient is here in clinic today with his wife, who is doing most of the talking. She has lots of concerns regarding the need for  repeat catheterization. She has many questions. We discussed the indication for procedure as well as reviewed procedural details as well as outlined potential associated risk. The patient verbalized that he will like to proceed with further testing.  He is stable in clinic today. He denies any chest pain or dyspnea. No syncope/near-syncope. He is euvolemic on physical exam.   Current Meds  Medication Sig  . amitriptyline (ELAVIL) 100 MG tablet Take 100 mg by mouth at bedtime.  . B-D ULTRAFINE III SHORT PEN 31G X 8 MM MISC 1 each by Other route daily as needed (GLUCOSE TESTING (INSULINE NEEDLE)).   . insulin aspart (NOVOLOG) 100 UNIT/ML injection Inject 6-14 Units into the skin 3 (three) times daily before meals. Patient's dose is done on a sliding scale. Usually takes 8 units, but dose varies depending on glucose level.  . iron polysaccharides (NIFEREX) 150 MG capsule Take 150 mg by mouth daily.   Marland Kitchen LEVEMIR FLEXTOUCH 100 UNIT/ML Pen INJECT 42-44 UNITS INTO THE SKIN ONCE AT BEDTIME  . metFORMIN (GLUCOPHAGE) 500 MG tablet Take 1,000 mg by mouth 2 (two) times daily with a meal.  . metoprolol succinate (TOPROL-XL) 50 MG 24 hr tablet TAKE 1 TABLET BY MOUTH EVERY DAY. TAKE WITH OR IMMEDIATELY FOLLOWING A MEAL  . Multiple Vitamin (MULTIVITAMIN) tablet Take 1 tablet by mouth daily.  . Omega-3 Fatty Acids (FISH OIL MAXIMUM STRENGTH) 1200 MG CAPS Take 3,000 mg by mouth 2 (two) times daily.   . ONE TOUCH ULTRA TEST test strip 1 each by Other route as needed for other.   . simvastatin (ZOCOR) 40 MG  tablet Take 40 mg by mouth every evening.  . zolpidem (AMBIEN) 10 MG tablet Take 10 mg by mouth at bedtime as needed for sleep.   . [DISCONTINUED] aspirin 81 MG EC tablet Take 1 tablet (81 mg total) by mouth daily.   Allergies  Allergen Reactions  . Lipitor [Atorvastatin] Other (See Comments)    myalgias  . Adhesive [Tape] Rash   Past Medical History:  Diagnosis Date  . AAA (abdominal aortic aneurysm)  (HCC)    (-)11/11,CT abdomen-no aneurysm aortic  . Aortic stenosis, mild 06/27/2014   Echo 3/17: EF 55-60%, normal wall motion, normal diastolic function, mild aortic stenosis (mean 17 mmHg), moderate AI, MAC  . Cancer [facility])    mediastinal seminoma resected 1985 - benign  . Chest pain   . Chronic kidney disease    kidney stones s/p lithotripsy  . Coronary artery disease 02/2012   60% mid RCA, 80% prox left circ, 99% distal left circ, normal LAD s/p PCI left circ/OM, 02/2012 -now s/p CABG  // ETT 3/17: No ischemic changes, Hypertensive BP response.  . Diabetes mellitus    neuropathy  insulin dependent  . Dyslipidemia   . Elevated cholesterol   . Erectile dysfunction   . Erectile dysfunction   . GERD (gastroesophageal reflux disease)   . Hypertension   . Insomnia   . Insomnia   . NSTEMI (non-ST elevated myocardial infarction) ([place])   . Shortness of breath   . Stroke ([place])    TIA's  . TIA (transient ischemic attack)    Family History  Problem Relation Age of Onset  . Diabetes Father   . Diabetes Mother   . Heart failure Mother   . Hypertension Mother   . Cancer Brother   . Diabetes Sister    Past Surgical History:  Procedure Laterality Date  . COLONOSCOPY  07/25/2012   Procedure: COLONOSCOPY;  Surgeon: Winfield Cunas., MD;  Location: [facility] ENDOSCOPY;  Service: Endoscopy;  Laterality: N/A;  . COLONOSCOPY N/A 12/02/2013   Procedure: COLONOSCOPY;  Surgeon: Winfield Cunas., MD;  Location: [facility] ENDOSCOPY;  Service: Endoscopy;  Laterality: N/A;  . CORONARY ARTERY BYPASS GRAFT N/A 01/04/2013   Procedure: CORONARY ARTERY BYPASS GRAFTING (CABG) times two using right saphenous vein harvested with endoscope.;  Surgeon: Ivin Poot, MD;  Location: [facility];  Service: Open Heart Surgery;  Laterality: N/A;  . ESOPHAGOGASTRODUODENOSCOPY  07/20/2012   Procedure: ESOPHAGOGASTRODUODENOSCOPY (EGD);  Surgeon: Winfield Cunas., MD;  Location: [facility] ENDOSCOPY;  Service: Endoscopy;  Laterality:  N/A;  . ESOPHAGOGASTRODUODENOSCOPY N/A 12/02/2013   Procedure: ESOPHAGOGASTRODUODENOSCOPY (EGD);  Surgeon: Winfield Cunas., MD;  Location: [facility] ENDOSCOPY;  Service: Endoscopy;  Laterality: N/A;  . HOT HEMOSTASIS N/A 12/02/2013   Procedure: HOT HEMOSTASIS (ARGON PLASMA COAGULATION/BICAP);  Surgeon: Winfield Cunas., MD;  Location: [facility] ENDOSCOPY;  Service: Endoscopy;  Laterality: N/A;  . INTRAOPERATIVE TRANSESOPHAGEAL ECHOCARDIOGRAM N/A 01/04/2013   Procedure: INTRAOPERATIVE TRANSESOPHAGEAL ECHOCARDIOGRAM;  Surgeon: Ivin Poot, MD;  Location: [place];  Service: Open Heart Surgery;  Laterality: N/A;  . LEFT HEART CATHETERIZATION WITH CORONARY ANGIOGRAM N/A 03/06/2012   Procedure: LEFT HEART CATHETERIZATION WITH CORONARY ANGIOGRAM;  Surgeon: [name], MD;  Location: [facility] CATH LAB;  Service: Cardiovascular;  Laterality: N/A;  . lithotripsy    . RADIAL ARTERY HARVEST Left 01/04/2013   Procedure: RADIAL ARTERY HARVEST;  Surgeon: Ivin Poot, MD;  Location: [facility];  Service: Vascular;  Laterality: Left;  Artery not havested.  Unsuitable for use.  . resection mediastinal seminonma     Social History   Social History  . Marital status: Married    Spouse name: N/A  . Number of children: N/A  . Years of education: N/A   Occupational History  . Not on file.   Social History Main Topics  . Smoking status: Former Smoker    Quit date: 03/06/1985  . Smokeless tobacco: Never Used  . Alcohol use No  . Drug use: No  . Sexual activity: Not Currently   Other Topics Concern  . Not on file   Social History Narrative  . No narrative on file     Review of Systems: General: negative for chills, fever, night sweats or weight changes.  Cardiovascular: negative for chest pain, dyspnea on exertion, edema, orthopnea, palpitations, paroxysmal nocturnal dyspnea or shortness of breath Dermatological: negative for rash Respiratory: negative for cough or wheezing Urologic: negative for  hematuria Abdominal: negative for nausea, vomiting, diarrhea, bright red blood per rectum, melena, or hematemesis Neurologic: negative for visual changes, syncope, or dizziness All other systems reviewed and are otherwise negative except as noted above.   Physical Exam:  Blood pressure 136/84, pulse 97, height _0  (1.803 m), weight 189 lb (85.7 kg), SpO2 99 %.  General appearance: alert, cooperative and no distress Neck: no carotid bruit and no JVD Lungs: clear to auscultation bilaterally Heart: regular rate and rhythm and 2/6 SM with radiation to the carotids bilaterally Extremities: extremities normal, atraumatic, no cyanosis or edema Pulses: 2+ and symmetric Skin: Skin color, texture, turgor normal. No rashes or lesions Neurologic: Grossly normal  EKG NSR. No ischemia   2D Echo 10/26/16 Study Conclusions  - Left ventricle: The cavity size was normal. There was moderate   focal basal hypertrophy of the septum with mild concentric   hypertrophy. Systolic function was moderately reduced. The   estimated ejection fraction was in the range of 35% to 40%.   Diffuse hypokinesis. The study is not technically sufficient to   allow evaluation of LV diastolic function. Diastolic function is   abnormal but cannot be qualified. - Aortic valve: Valve mobility was restricted. There was mild   stenosis. There was moderate regurgitation. - Mitral valve: Calcified annulus. Transvalvular velocity was   within the normal range. There was no evidence for stenosis.   There was mild regurgitation. - Left atrium: The atrium was mildly to moderately dilated. - Right ventricle: The cavity size was normal. Wall thickness was   normal. - Atrial septum: No defect or patent foramen ovale was identified   by color flow Doppler. - Tricuspid valve: There was mild regurgitation. - Pulmonary arteries: Systolic pressure was within the normal   range. PA peak pressure: 28 mm Hg (S).  Impressions:  -  Compared with echo 0/9381, systolic function is reduced. Aortic   stenosis appears worse visually, but the gradients are unchanged.   This could be due to low flow, low gradient aortic stenosis.  ASSESSMENT AND PLAN:   1. Aortic Stenosis: recent 2D echo showed  Restricted valve mobility. There was mild stenosis with moderate regurgitation. Gradients weren't significantly uncharged. Mean gradient 16 mm Hg. However there is concern for low flow, low gradient severe aortic stenosis. EF is reduced at 35-40%. Dr. Radford [name] has recommended [facility]. He is willing to proceed. He denies any CP, dyspnea, syncope/ near syncope.   2. CAD: h/o CABG in 2015. Nonischemic NST in 2017, however now with reduced LVEF. This may be  from his AS. We will plan R/LHC to further evaluate. He is CP free. Continue medical therapy.    3. New Systolic HF: EF reduced at 35-40%, previously 55-60%.  ? Etiology, severe AS, vs worsening CAD, vs nonischemic. Plan is for [facility]. He is asymptomatic and euvolemic.   4. R/LHC:  discussed the indication for procedure as well as reviewed procedural details as well as outlined potential associated risk, which include but not limited to death, MI, stroke, vascular injury, bleeding, renal toxicity and allergic reaction. The patient verbalized that he will like to proceed with further testing. We will check precath labs today, including BMP, CBC and INR. Plan for procedure next week at [facility].     PLAN  f/u with Dr. Radford [name] after [facility].    Lyda Jester PA-C 11/11/2016 9:37 AM

## 2016-11-12 LAB — CBC
HEMATOCRIT: 45.9 % (ref 37.5–51.0)
HEMOGLOBIN: 15.6 g/dL (ref 13.0–17.7)
MCH: 27.1 pg (ref 26.6–33.0)
MCHC: 34 g/dL (ref 31.5–35.7)
MCV: 80 fL (ref 79–97)
Platelets: 191 10*3/uL (ref 150–379)
RBC: 5.75 x10E6/uL (ref 4.14–5.80)
RDW: 15.5 % — ABNORMAL HIGH (ref 12.3–15.4)
WBC: 3.9 10*3/uL (ref 3.4–10.8)

## 2016-11-12 LAB — PROTIME-INR
INR: 1 (ref 0.8–1.2)
Prothrombin Time: 10.6 s (ref 9.1–12.0)

## 2016-11-15 ENCOUNTER — Ambulatory Visit (HOSPITAL_COMMUNITY)
Admission: RE | Admit: 2016-11-15 | Discharge: 2016-11-15 | Disposition: A | Discharge status: Home / Self Care | Payer: Medicare Other | Source: Ambulatory Visit | Attending: Cardiology | Admitting: Cardiology

## 2016-11-15 ENCOUNTER — Encounter (HOSPITAL_COMMUNITY): Payer: Medicare Other

## 2016-11-15 ENCOUNTER — Encounter (HOSPITAL_COMMUNITY)
Admission: RE | Disposition: A | Discharge status: Home / Self Care | Payer: Self-pay | Source: Ambulatory Visit | Attending: Cardiology

## 2016-11-15 DIAGNOSIS — E7800 Pure hypercholesterolemia, unspecified: Secondary | ICD-10-CM | POA: Insufficient documentation

## 2016-11-15 DIAGNOSIS — I2510 Atherosclerotic heart disease of native coronary artery without angina pectoris: Secondary | ICD-10-CM | POA: Diagnosis not present

## 2016-11-15 DIAGNOSIS — I5022 Chronic systolic (congestive) heart failure: Secondary | ICD-10-CM | POA: Diagnosis not present

## 2016-11-15 DIAGNOSIS — Z87891 Personal history of nicotine dependence: Secondary | ICD-10-CM | POA: Diagnosis not present

## 2016-11-15 DIAGNOSIS — E1140 Type 2 diabetes mellitus with diabetic neuropathy, unspecified: Secondary | ICD-10-CM | POA: Insufficient documentation

## 2016-11-15 DIAGNOSIS — I2584 Coronary atherosclerosis due to calcified coronary lesion: Secondary | ICD-10-CM | POA: Diagnosis not present

## 2016-11-15 DIAGNOSIS — Z8249 Family history of ischemic heart disease and other diseases of the circulatory system: Secondary | ICD-10-CM | POA: Insufficient documentation

## 2016-11-15 DIAGNOSIS — Z8673 Personal history of transient ischemic attack (TIA), and cerebral infarction without residual deficits: Secondary | ICD-10-CM | POA: Diagnosis not present

## 2016-11-15 DIAGNOSIS — G4700 Insomnia, unspecified: Secondary | ICD-10-CM | POA: Diagnosis not present

## 2016-11-15 DIAGNOSIS — I2582 Chronic total occlusion of coronary artery: Secondary | ICD-10-CM | POA: Insufficient documentation

## 2016-11-15 DIAGNOSIS — E1122 Type 2 diabetes mellitus with diabetic chronic kidney disease: Secondary | ICD-10-CM | POA: Insufficient documentation

## 2016-11-15 DIAGNOSIS — I130 Hypertensive heart and chronic kidney disease with heart failure and stage 1 through stage 4 chronic kidney disease, or unspecified chronic kidney disease: Secondary | ICD-10-CM | POA: Insufficient documentation

## 2016-11-15 DIAGNOSIS — I352 Nonrheumatic aortic (valve) stenosis with insufficiency: Secondary | ICD-10-CM | POA: Diagnosis not present

## 2016-11-15 DIAGNOSIS — I714 Abdominal aortic aneurysm, without rupture: Secondary | ICD-10-CM | POA: Diagnosis not present

## 2016-11-15 DIAGNOSIS — I252 Old myocardial infarction: Secondary | ICD-10-CM | POA: Diagnosis not present

## 2016-11-15 DIAGNOSIS — I25119 Atherosclerotic heart disease of native coronary artery with unspecified angina pectoris: Secondary | ICD-10-CM

## 2016-11-15 DIAGNOSIS — Z833 Family history of diabetes mellitus: Secondary | ICD-10-CM | POA: Diagnosis not present

## 2016-11-15 DIAGNOSIS — I350 Nonrheumatic aortic (valve) stenosis: Secondary | ICD-10-CM | POA: Diagnosis not present

## 2016-11-15 DIAGNOSIS — N189 Chronic kidney disease, unspecified: Secondary | ICD-10-CM | POA: Diagnosis not present

## 2016-11-15 DIAGNOSIS — Z951 Presence of aortocoronary bypass graft: Secondary | ICD-10-CM | POA: Insufficient documentation

## 2016-11-15 DIAGNOSIS — Z794 Long term (current) use of insulin: Secondary | ICD-10-CM | POA: Insufficient documentation

## 2016-11-15 DIAGNOSIS — I420 Dilated cardiomyopathy: Secondary | ICD-10-CM

## 2016-11-15 DIAGNOSIS — K219 Gastro-esophageal reflux disease without esophagitis: Secondary | ICD-10-CM | POA: Insufficient documentation

## 2016-11-15 DIAGNOSIS — I429 Cardiomyopathy, unspecified: Secondary | ICD-10-CM

## 2016-11-15 HISTORY — PX: RIGHT/LEFT HEART CATH AND CORONARY/GRAFT ANGIOGRAPHY: CATH118267

## 2016-11-15 LAB — POCT I-STAT 3, VENOUS BLOOD GAS (G3P V)
ACID-BASE DEFICIT: 2 mmol/L (ref 0.0–2.0)
Acid-base deficit: 1 mmol/L (ref 0.0–2.0)
BICARBONATE: 24.3 mmol/L (ref 20.0–28.0)
Bicarbonate: 24.9 mmol/L (ref 20.0–28.0)
O2 SAT: 68 %
O2 SAT: 69 %
TCO2: 26 mmol/L (ref 0–100)
TCO2: 26 mmol/L (ref 0–100)
pCO2, Ven: 45 mmHg (ref 44.0–60.0)
pCO2, Ven: 45.1 mmHg (ref 44.0–60.0)
pH, Ven: 7.34 (ref 7.250–7.430)
pH, Ven: 7.352 (ref 7.250–7.430)
pO2, Ven: 37 mmHg (ref 32.0–45.0)
pO2, Ven: 38 mmHg (ref 32.0–45.0)

## 2016-11-15 LAB — POCT I-STAT 3, ART BLOOD GAS (G3+)
ACID-BASE DEFICIT: 1 mmol/L (ref 0.0–2.0)
Bicarbonate: 24 mmol/L (ref 20.0–28.0)
O2 SAT: 96 %
TCO2: 25 mmol/L (ref 0–100)
pCO2 arterial: 39.6 mmHg (ref 32.0–48.0)
pH, Arterial: 7.39 (ref 7.350–7.450)
pO2, Arterial: 84 mmHg (ref 83.0–108.0)

## 2016-11-15 LAB — GLUCOSE, CAPILLARY
Glucose-Capillary: 151 mg/dL — ABNORMAL HIGH (ref 65–99)
Glucose-Capillary: 84 mg/dL (ref 65–99)

## 2016-11-15 SURGERY — RIGHT/LEFT HEART CATH AND CORONARY/GRAFT ANGIOGRAPHY
Anesthesia: LOCAL

## 2016-11-15 MED ORDER — SODIUM CHLORIDE 0.9 % IV SOLN
INTRAVENOUS | Status: DC
  Administered 2016-11-15: 10:00:00 via INTRAVENOUS

## 2016-11-15 MED ORDER — DIPHENHYDRAMINE HCL 25 MG PO CAPS
ORAL_CAPSULE | ORAL | Status: AC
  Administered 2016-11-15: 25 mg via ORAL
  Filled 2016-11-15: qty 1

## 2016-11-15 MED ORDER — METOPROLOL SUCCINATE ER 25 MG PO TB24: 50.0000 mg | ORAL_TABLET | Freq: Every day | ORAL | Status: DC

## 2016-11-15 MED ORDER — MIDAZOLAM HCL 2 MG/2ML IJ SOLN
INTRAMUSCULAR | Status: AC
  Filled 2016-11-15: qty 2

## 2016-11-15 MED ORDER — ONDANSETRON HCL 4 MG/2ML IJ SOLN: 4.0000 mg | Freq: Four times a day (QID) | INTRAMUSCULAR | Status: DC | PRN

## 2016-11-15 MED ORDER — SODIUM CHLORIDE 0.9 % IV SOLN: 250.0000 mL | INTRAVENOUS | Status: DC | PRN

## 2016-11-15 MED ORDER — ACETAMINOPHEN 325 MG PO TABS: 650.0000 mg | ORAL_TABLET | ORAL | Status: DC | PRN

## 2016-11-15 MED ORDER — SODIUM CHLORIDE 0.9% FLUSH: 3.0000 mL | INTRAVENOUS | Status: DC | PRN

## 2016-11-15 MED ORDER — FENTANYL CITRATE (PF) 100 MCG/2ML IJ SOLN
INTRAMUSCULAR | Status: AC
  Filled 2016-11-15: qty 2

## 2016-11-15 MED ORDER — SODIUM CHLORIDE 0.9 % IV SOLN: INTRAVENOUS | Status: AC

## 2016-11-15 MED ORDER — FENTANYL CITRATE (PF) 100 MCG/2ML IJ SOLN
INTRAMUSCULAR | Status: DC | PRN
  Administered 2016-11-15: 25 ug via INTRAVENOUS

## 2016-11-15 MED ORDER — MIDAZOLAM HCL 2 MG/2ML IJ SOLN
INTRAMUSCULAR | Status: DC | PRN
  Administered 2016-11-15: 1 mg via INTRAVENOUS

## 2016-11-15 MED ORDER — VERAPAMIL HCL 2.5 MG/ML IV SOLN
INTRAVENOUS | Status: DC | PRN
  Administered 2016-11-15: 10 mL via INTRA_ARTERIAL

## 2016-11-15 MED ORDER — SODIUM CHLORIDE 0.9% FLUSH: 3.0000 mL | Freq: Two times a day (BID) | INTRAVENOUS | Status: DC

## 2016-11-15 MED ORDER — HEPARIN SODIUM (PORCINE) 1000 UNIT/ML IJ SOLN
INTRAMUSCULAR | Status: DC | PRN
  Administered 2016-11-15: 4000 [IU] via INTRAVENOUS

## 2016-11-15 MED ORDER — ASPIRIN 81 MG PO CHEW
CHEWABLE_TABLET | ORAL | Status: AC
  Filled 2016-11-15: qty 1

## 2016-11-15 MED ORDER — VERAPAMIL HCL 2.5 MG/ML IV SOLN
INTRAVENOUS | Status: AC
Start: 2016-11-15 — End: ?
  Filled 2016-11-15: qty 2

## 2016-11-15 MED ORDER — LIDOCAINE HCL (PF) 1 % IJ SOLN
INTRAMUSCULAR | Status: AC
  Filled 2016-11-15: qty 30

## 2016-11-15 MED ORDER — LIDOCAINE HCL (PF) 1 % IJ SOLN
INTRAMUSCULAR | Status: DC | PRN
  Administered 2016-11-15 (×2): 2 mL via INTRADERMAL

## 2016-11-15 MED ORDER — HEPARIN (PORCINE) IN NACL 2-0.9 UNIT/ML-% IJ SOLN
INTRAMUSCULAR | Status: DC | PRN
  Administered 2016-11-15: 1000 mL

## 2016-11-15 MED ORDER — ASPIRIN 81 MG PO CHEW
81.0000 mg | CHEWABLE_TABLET | ORAL | Status: AC
  Administered 2016-11-15: 81 mg via ORAL

## 2016-11-15 MED ORDER — IOPAMIDOL (ISOVUE-370) INJECTION 76%
INTRAVENOUS | Status: DC | PRN
  Administered 2016-11-15: 150 mL via INTRA_ARTERIAL

## 2016-11-15 MED ORDER — DIPHENHYDRAMINE HCL 25 MG PO CAPS
25.0000 mg | ORAL_CAPSULE | Freq: Once | ORAL | Status: AC
  Administered 2016-11-15: 25 mg via ORAL
  Filled 2016-11-15: qty 1

## 2016-11-15 MED ORDER — HEPARIN SODIUM (PORCINE) 1000 UNIT/ML IJ SOLN
INTRAMUSCULAR | Status: AC
  Filled 2016-11-15: qty 1

## 2016-11-15 MED ORDER — IOPAMIDOL (ISOVUE-370) INJECTION 76%
INTRAVENOUS | Status: AC
  Filled 2016-11-15: qty 100

## 2016-11-15 SURGICAL SUPPLY — 19 items
CATH BALLN WEDGE 5F 110CM (CATHETERS) ×2 IMPLANT
CATH INFINITI 5 FR 3DRC (CATHETERS) ×2 IMPLANT
CATH INFINITI 5FR AL1 (CATHETERS) ×2 IMPLANT
CATH INFINITI 5FR MULTPACK ANG (CATHETERS) ×2 IMPLANT
CATH LAUNCHER 5F RADR (CATHETERS) ×1 IMPLANT
CATH SWAN GANZ 7F STRAIGHT (CATHETERS) IMPLANT
CATHETER LAUNCHER 5F RADR (CATHETERS) ×2
DEVICE RAD COMP TR BAND LRG (VASCULAR PRODUCTS) ×2 IMPLANT
GLIDESHEATH SLEND A-KIT 6F 22G (SHEATH) ×2 IMPLANT
GUIDEWIRE INQWIRE 1.5J.035X260 (WIRE) ×1 IMPLANT
INQWIRE 1.5J .035X260CM (WIRE) ×2
KIT HEART LEFT (KITS) ×2 IMPLANT
PACK CARDIAC CATHETERIZATION (CUSTOM PROCEDURE TRAY) ×2 IMPLANT
SHEATH GLIDE SLENDER 4/5FR (SHEATH) ×2 IMPLANT
SHEATH PINNACLE 5F 10CM (SHEATH) IMPLANT
SHEATH PINNACLE 7F 10CM (SHEATH) IMPLANT
TRANSDUCER W/STOPCOCK (MISCELLANEOUS) ×2 IMPLANT
TUBING CIL FLEX 10 FLL-RA (TUBING) ×2 IMPLANT
WIRE EMERALD 3MM-J .035X150CM (WIRE) IMPLANT

## 2016-11-15 NOTE — Progress Notes (Signed)
Dr Ellyn Hack in and notified of client with sneezing and coughing and order noted and med given

## 2016-11-15 NOTE — Discharge Instructions (Signed)
Radial Site Care °Refer to this sheet in the next few weeks. These instructions provide you with information about caring for yourself after your procedure. Your health care provider may also give you more specific instructions. Your treatment has been planned according to current medical practices, but problems sometimes occur. Call your health care provider if you have any problems or questions after your procedure. °What can I expect after the procedure? °After your procedure, it is typical to have the following: °· Bruising at the radial site that usually fades within 1-2 weeks. °· Blood collecting in the tissue (hematoma) that may be painful to the touch. It should usually decrease in size and tenderness within 1-2 weeks. °Follow these instructions at home: °· Take medicines only as directed by your health care provider. °· You may shower 24-48 hours after the procedure or as directed by your health care provider. Remove the bandage (dressing) and gently wash the site with plain soap and water. Pat the area dry with a clean towel. Do not rub the site, because this may cause bleeding. °· Do not take baths, swim, or use a hot tub until your health care provider approves. °· Check your insertion site every day for redness, swelling, or drainage. °· Do not apply powder or lotion to the site. °· Do not flex or bend the affected arm for 24 hours or as directed by your health care provider. °· Do not push or pull heavy objects with the affected arm for 24 hours or as directed by your health care provider. °· Do not lift over 10 lb (4.5 kg) for 5 days after your procedure or as directed by your health care provider. °· Ask your health care provider when it is okay to: °¨ Return to work or school. °¨ Resume usual physical activities or sports. °¨ Resume sexual activity. °· Do not drive home if you are discharged the same day as the procedure. Have someone else drive you. °· You may drive 24 hours after the procedure  unless otherwise instructed by your health care provider. °· Do not operate machinery or power tools for 24 hours after the procedure. °· If your procedure was done as an outpatient procedure, which means that you went home the same day as your procedure, a responsible adult should be with you for the first 24 hours after you arrive home. °· Keep all follow-up visits as directed by your health care provider. This is important. °Contact a health care provider if: °· You have a fever. °· You have chills. °· You have increased bleeding from the radial site. Hold pressure on the site. °Get help right away if: °· You have unusual pain at the radial site. °· You have redness, warmth, or swelling at the radial site. °· You have drainage (other than a small amount of blood on the dressing) from the radial site. °· The radial site is bleeding, and the bleeding does not stop after 30 minutes of holding steady pressure on the site. °· Your arm or hand becomes pale, cool, tingly, or numb. °This information is not intended to replace advice given to you by your health care provider. Make sure you discuss any questions you have with your health care provider. °Document Released: 09/03/2010 Document Revised: 01/07/2016 Document Reviewed: 02/17/2014 °Elsevier Interactive Patient Education © 2017 [employer] ° °

## 2016-11-15 NOTE — Interval H&P Note (Signed)
History and Physical Interval Note:  11/15/2016 11:21 AM  [name]  has presented today for surgery, with the diagnosis of aortic stenosis, cad - with new reduced EF perked in his coronary intervention  The various methods of treatment have been discussed with the patient and family. After consideration of risks, benefits and other options for treatment, the patient has consented to  Procedure(s): Right/Left Heart Cath and Coronary/Graft Angiography (N/A) With Possible Percutaneous Coronary Intervention as a surgical intervention .  The patient's history has been reviewed, patient examined, no change in status, stable for surgery.  I have reviewed the patient's chart and labs.  Questions were answered to the patient's satisfaction.     Glenetta Hew

## 2016-11-15 NOTE — H&P (View-Only) (Signed)
11/11/2016 [name]   68-31-1952  [id number]  Primary Physician [name], MD Primary Cardiologist: Dr. Radford Pax    Reason for Visit/CC: F/u for Aortic Stenosis, Systolic HF; Discuss Indication for [facility]  HPI:  [name] returns to clinic today as advised by his primary cardiologist Dr. Radford Pax to discuss results of his recent echocardiogram as well as discuss indication, procedure details and risk of recommended right and left heart catheterization.  He is a 66 year old African-American male with a history of CAD. He suffered a non-STEMI in 2013 treated with PCI of the LCx/OM. LHC in 5/14 demonstrated severe 2 vessel CAD with critical in-stent restenosis in the LCx. He underwent CABG (SVG-PDA, SVG-LCx). He also has a history of aortic stenosis. Echo in 11/15 with normal LV function and mild aortic stenosis with a mean gradient of 11 mmHg and mild to moderate AI. He was [place], [place] in March 2017 and was complaining of SOB.  ETT showed no ischemia and echo showed normal LVF with mild AS and moderate AR which was stable from 2015.   He was last seen by Dr. Radford Pax on 05/16/2016. He was doing well at that visit without any complaints of chest pain or dyspnea. Dr. Radford Pax recommended that he undergo repeat echocardiogram March 2018 to assess for progression of his aortic valve disease. Echocardiogram was performed 10/26/2016. This demonstrated notable reduction in left ventricular systolic function with a new ejection fraction of 35-40% with diffuse hypokinesis. Visualization of his  aortic valve revealed restricted mobility. There was mild stenosis with moderate regurgitation. Gradients weren't significantly uncharged. Mean gradient 16 mm Hg. However there is concern for low flow, low gradient severe aortic stenosis. Dr. Radford Pax has recommended [facility].   The patient is here in clinic today with his wife, who is doing most of the talking. She has lots of concerns regarding the need for  repeat catheterization. She has many questions. We discussed the indication for procedure as well as reviewed procedural details as well as outlined potential associated risk. The patient verbalized that he will like to proceed with further testing.  He is stable in clinic today. He denies any chest pain or dyspnea. No syncope/near-syncope. He is euvolemic on physical exam.   Current Meds  Medication Sig  . amitriptyline (ELAVIL) 100 MG tablet Take 100 mg by mouth at bedtime.  . B-D ULTRAFINE III SHORT PEN 31G X 8 MM MISC 1 each by Other route daily as needed (GLUCOSE TESTING (INSULINE NEEDLE)).   . insulin aspart (NOVOLOG) 100 UNIT/ML injection Inject 6-14 Units into the skin 3 (three) times daily before meals. Patient's dose is done on a sliding scale. Usually takes 8 units, but dose varies depending on glucose level.  . iron polysaccharides (NIFEREX) 150 MG capsule Take 150 mg by mouth daily.   Marland Kitchen LEVEMIR FLEXTOUCH 100 UNIT/ML Pen INJECT 42-44 UNITS INTO THE SKIN ONCE AT BEDTIME  . metFORMIN (GLUCOPHAGE) 500 MG tablet Take 1,000 mg by mouth 2 (two) times daily with a meal.  . metoprolol succinate (TOPROL-XL) 50 MG 24 hr tablet TAKE 1 TABLET BY MOUTH EVERY DAY. TAKE WITH OR IMMEDIATELY FOLLOWING A MEAL  . Multiple Vitamin (MULTIVITAMIN) tablet Take 1 tablet by mouth daily.  . Omega-3 Fatty Acids (FISH OIL MAXIMUM STRENGTH) 1200 MG CAPS Take 3,000 mg by mouth 2 (two) times daily.   . ONE TOUCH ULTRA TEST test strip 1 each by Other route as needed for other.   . simvastatin (ZOCOR) 40 MG  tablet Take 40 mg by mouth every evening.  . zolpidem (AMBIEN) 10 MG tablet Take 10 mg by mouth at bedtime as needed for sleep.   . [DISCONTINUED] aspirin 81 MG EC tablet Take 1 tablet (81 mg total) by mouth daily.   Allergies  Allergen Reactions  . Lipitor [Atorvastatin] Other (See Comments)    myalgias  . Adhesive [Tape] Rash   Past Medical History:  Diagnosis Date  . AAA (abdominal aortic aneurysm)  (HCC)    (-)11/11,CT abdomen-no aneurysm aortic  . Aortic stenosis, mild 06/27/2014   Echo 3/17: EF 55-60%, normal wall motion, normal diastolic function, mild aortic stenosis (mean 17 mmHg), moderate AI, MAC  . Cancer (HCC)    mediastinal seminoma resected 1985 - benign  . Chest pain   . Chronic kidney disease    kidney stones s/p lithotripsy  . Coronary artery disease 02/2012   60% mid RCA, 80% prox left circ, 99% distal left circ, normal LAD s/p PCI left circ/OM, 02/2012 -now s/p CABG  // ETT 3/17: No ischemic changes, Hypertensive BP response.  . Diabetes mellitus    neuropathy  insulin dependent  . Dyslipidemia   . Elevated cholesterol   . Erectile dysfunction   . Erectile dysfunction   . GERD (gastroesophageal reflux disease)   . Hypertension   . Insomnia   . Insomnia   . NSTEMI (non-ST elevated myocardial infarction) (HCC)   . Shortness of breath   . Stroke (HCC)    TIA's  . TIA (transient ischemic attack)    Family History  Problem Relation Age of Onset  . Diabetes Father   . Diabetes Mother   . Heart failure Mother   . Hypertension Mother   . Cancer Brother   . Diabetes Sister    Past Surgical History:  Procedure Laterality Date  . COLONOSCOPY  07/25/2012   Procedure: COLONOSCOPY;  Surgeon: James L Edwards Jr., MD;  [facility]: [facility];  Service: Endoscopy;  Laterality: N/A;  . COLONOSCOPY N/A 12/02/2013   Procedure: COLONOSCOPY;  Surgeon: James L Edwards Jr., MD;  Location: [facility] ENDOSCOPY;  Service: Endoscopy;  Laterality: N/A;  . CORONARY ARTERY BYPASS GRAFT N/A 01/04/2013   Procedure: CORONARY ARTERY BYPASS GRAFTING (CABG) times two using right saphenous vein harvested with endoscope.;  Surgeon: Peter Van Trigt, MD;  Location: [facility] OR;  Service: Open Heart Surgery;  Laterality: N/A;  . ESOPHAGOGASTRODUODENOSCOPY  07/20/2012   Procedure: ESOPHAGOGASTRODUODENOSCOPY (EGD);  Surgeon: James L Edwards Jr., MD;  Location: [facility] ENDOS[facility];  Service: Endoscopy;  Laterality:  N/A;  . ESOPHAGOGASTRODUODENOSCOPY N/A 12/02/2013   Procedure: ESOPHAGOGASTRODUODENOSCOPY (EGD);  Surgeon: James L Edwards Jr., MD;  Location: [facility];  Service: Endoscopy;  Laterality: N/A;  . HOT HEMOSTASIS N/A 12/02/2013   Procedure: HOT HEMOSTASIS (ARGON PLASMA COAGULATION/BICAP);  Surgeon: James L Edwards Jr., MD;  Location: [facility];  Service: Endoscopy;  Laterality: N/A;  . INTRAOPERATIVE TRANSESOPHAGEAL ECHOCARDIOGRAM N/A 01/04/2013   Procedure: INTRAOPERATIVE TRANSESOPHAGEAL ECHOCARDIOGRAM;  Surgeon: Peter Van Trigt, MD;  Location: [facility] OR;  Service: Open Heart Surgery;  Laterality: N/A;  . LEFT HEART CATHETERIZATION WITH CORONARY ANGIOGRAM N/A 03/06/2012   Procedure: LEFT HEART CATHETERIZATION WITH CORONARY ANGIOGRAM;  Surgeon: [name] Turner, MD;  [facility]: [facility] CATH LAB;  Service: Cardiovascular;  Laterality: N/A;  . lithotripsy    . RADIAL ARTERY HARVEST Left 01/04/2013   Procedure: RADIAL ARTERY HARVEST;  Surgeon: Peter Van Trigt, MD;  Location: [facility] OR;  Service: Vascular;  Laterality: Left;  Artery not havested.   Unsuitable for use.  . resection mediastinal seminonma     Social History   Social History  . Marital status: Married    Spouse name: N/A  . Number of children: N/A  . Years of education: N/A   Occupational History  . Not on file.   Social History Main Topics  . Smoking status: Former Smoker    Quit date: 03/06/1985  . Smokeless tobacco: Never Used  . Alcohol use No  . Drug use: No  . Sexual activity: Not Currently   Other Topics Concern  . Not on file   Social History Narrative  . No narrative on file     Review of Systems: General: negative for chills, fever, night sweats or weight changes.  Cardiovascular: negative for chest pain, dyspnea on exertion, edema, orthopnea, palpitations, paroxysmal nocturnal dyspnea or shortness of breath Dermatological: negative for rash Respiratory: negative for cough or wheezing Urologic: negative for  hematuria Abdominal: negative for nausea, vomiting, diarrhea, bright red blood per rectum, melena, or hematemesis Neurologic: negative for visual changes, syncope, or dizziness All other systems reviewed and are otherwise negative except as noted above.   Physical Exam:  Blood pressure 136/84, pulse 97, height _0  (1.803 m), weight 189 lb (85.7 kg), SpO2 99 %.  General appearance: alert, cooperative and no distress Neck: no carotid bruit and no JVD Lungs: clear to auscultation bilaterally Heart: regular rate and rhythm and 2/6 SM with radiation to the carotids bilaterally Extremities: extremities normal, atraumatic, no cyanosis or edema Pulses: 2+ and symmetric Skin: Skin color, texture, turgor normal. No rashes or lesions Neurologic: Grossly normal  EKG NSR. No ischemia   2D Echo 10/26/16 Study Conclusions  - Left ventricle: The cavity size was normal. There was moderate   focal basal hypertrophy of the septum with mild concentric   hypertrophy. Systolic function was moderately reduced. The   estimated ejection fraction was in the range of 35% to 40%.   Diffuse hypokinesis. The study is not technically sufficient to   allow evaluation of LV diastolic function. Diastolic function is   abnormal but cannot be qualified. - Aortic valve: Valve mobility was restricted. There was mild   stenosis. There was moderate regurgitation. - Mitral valve: Calcified annulus. Transvalvular velocity was   within the normal range. There was no evidence for stenosis.   There was mild regurgitation. - Left atrium: The atrium was mildly to moderately dilated. - Right ventricle: The cavity size was normal. Wall thickness was   normal. - Atrial septum: No defect or patent foramen ovale was identified   by color flow Doppler. - Tricuspid valve: There was mild regurgitation. - Pulmonary arteries: Systolic pressure was within the normal   range. PA peak pressure: 28 mm Hg (S).  Impressions:  -  Compared with echo 0/9381, systolic function is reduced. Aortic   stenosis appears worse visually, but the gradients are unchanged.   This could be due to low flow, low gradient aortic stenosis.  ASSESSMENT AND PLAN:   1. Aortic Stenosis: recent 2D echo showed  Restricted valve mobility. There was mild stenosis with moderate regurgitation. Gradients weren't significantly uncharged. Mean gradient 16 mm Hg. However there is concern for low flow, low gradient severe aortic stenosis. EF is reduced at 35-40%. Dr. Radford Pax has recommended [facility]. He is willing to proceed. He denies any CP, dyspnea, syncope/ near syncope.   2. CAD: h/o CABG in 2015. Nonischemic NST in 2017, however now with reduced LVEF. This may be  from his AS. We will plan R/LHC to further evaluate. He is CP free. Continue medical therapy.    3. New Systolic HF: EF reduced at 35-40%, previously 55-60%.  ? Etiology, severe AS, vs worsening CAD, vs nonischemic. Plan is for [facility]. He is asymptomatic and euvolemic.   4. R/LHC:  discussed the indication for procedure as well as reviewed procedural details as well as outlined potential associated risk, which include but not limited to death, MI, stroke, vascular injury, bleeding, renal toxicity and allergic reaction. The patient verbalized that he will like to proceed with further testing. We will check precath labs today, including BMP, CBC and INR. Plan for procedure next week at [facility].     PLAN  f/u with Dr. Radford Pax after [facility].    Lyda Jester PA-C 11/11/2016 9:37 AM

## 2016-11-16 ENCOUNTER — Encounter (HOSPITAL_COMMUNITY): Payer: Self-pay | Admitting: Cardiology

## 2016-11-17 ENCOUNTER — Encounter (HOSPITAL_COMMUNITY): Payer: Medicare Other

## 2016-11-18 ENCOUNTER — Encounter: Payer: Self-pay | Admitting: Cardiology

## 2016-11-18 ENCOUNTER — Encounter (HOSPITAL_COMMUNITY)
Admission: RE | Admit: 2016-11-18 | Discharge: 2016-11-18 | Disposition: A | Discharge status: Home / Self Care | Payer: Medicare Other | Source: Ambulatory Visit | Attending: Cardiology | Admitting: Cardiology

## 2016-11-22 ENCOUNTER — Encounter (HOSPITAL_COMMUNITY)
Admission: RE | Admit: 2016-11-22 | Discharge: 2016-11-22 | Disposition: A | Discharge status: Home / Self Care | Payer: Medicare Other | Source: Ambulatory Visit | Attending: Cardiology | Admitting: Cardiology

## 2016-11-24 ENCOUNTER — Encounter (HOSPITAL_COMMUNITY)
Admission: RE | Admit: 2016-11-24 | Discharge: 2016-11-24 | Disposition: A | Discharge status: Home / Self Care | Payer: Medicare Other | Source: Ambulatory Visit | Attending: Cardiology | Admitting: Cardiology

## 2016-11-25 ENCOUNTER — Encounter (HOSPITAL_COMMUNITY)
Admission: RE | Admit: 2016-11-25 | Discharge: 2016-11-25 | Disposition: A | Discharge status: Home / Self Care | Payer: Medicare Other | Source: Ambulatory Visit | Attending: Cardiology | Admitting: Cardiology

## 2016-11-29 ENCOUNTER — Encounter (HOSPITAL_COMMUNITY)
Admission: RE | Admit: 2016-11-29 | Discharge: 2016-11-29 | Disposition: A | Discharge status: Home / Self Care | Payer: Medicare Other | Source: Ambulatory Visit | Attending: Cardiology | Admitting: Cardiology

## 2016-12-01 ENCOUNTER — Encounter (HOSPITAL_COMMUNITY)
Admission: RE | Admit: 2016-12-01 | Discharge: 2016-12-01 | Disposition: A | Discharge status: Home / Self Care | Payer: Medicare Other | Source: Ambulatory Visit | Attending: Cardiology | Admitting: Cardiology

## 2016-12-02 ENCOUNTER — Encounter (HOSPITAL_COMMUNITY)
Admission: RE | Admit: 2016-12-02 | Discharge: 2016-12-02 | Disposition: A | Discharge status: Home / Self Care | Payer: Medicare Other | Source: Ambulatory Visit | Attending: Cardiology | Admitting: Cardiology

## 2016-12-06 ENCOUNTER — Encounter (HOSPITAL_COMMUNITY)
Admission: RE | Admit: 2016-12-06 | Discharge: 2016-12-06 | Disposition: A | Discharge status: Home / Self Care | Payer: Medicare Other | Source: Ambulatory Visit | Attending: Cardiology | Admitting: Cardiology

## 2016-12-07 ENCOUNTER — Ambulatory Visit: Payer: Medicare Other | Admitting: Cardiology

## 2016-12-08 ENCOUNTER — Ambulatory Visit (INDEPENDENT_AMBULATORY_CARE_PROVIDER_SITE_OTHER): Payer: Medicare Other | Admitting: Cardiology

## 2016-12-08 ENCOUNTER — Encounter: Payer: Self-pay | Admitting: Cardiology

## 2016-12-08 ENCOUNTER — Encounter (HOSPITAL_COMMUNITY)
Admission: RE | Admit: 2016-12-08 | Discharge: 2016-12-08 | Disposition: A | Discharge status: Home / Self Care | Payer: Medicare Other | Source: Ambulatory Visit | Attending: Cardiology | Admitting: Cardiology

## 2016-12-08 ENCOUNTER — Encounter (INDEPENDENT_AMBULATORY_CARE_PROVIDER_SITE_OTHER): Payer: Self-pay

## 2016-12-08 VITALS — BP 138/60 | HR 104 | Ht 71.0 in | Wt 189.4 lb

## 2016-12-08 DIAGNOSIS — I25119 Atherosclerotic heart disease of native coronary artery with unspecified angina pectoris: Secondary | ICD-10-CM | POA: Diagnosis not present

## 2016-12-08 DIAGNOSIS — I209 Angina pectoris, unspecified: Secondary | ICD-10-CM

## 2016-12-08 MED ORDER — METOPROLOL SUCCINATE ER 50 MG PO TB24: ORAL_TABLET | ORAL | 3 refills | Status: DC

## 2016-12-08 NOTE — Patient Instructions (Signed)
Your physician has recommended you make the following change in your medication:  -- increase metoprolol to '75mg'$  daily  Your physician wants you to follow-up in: October 2018 with Dr. Radford Pax. You will receive a reminder letter in the mail two months in advance. If you don't receive a letter, please call our office to schedule the follow-up appointment.

## 2016-12-08 NOTE — Progress Notes (Signed)
12/08/2016 [name]   January 13, 1951  [id number]  Primary Physician [name], MD Primary Cardiologist: Dr. Radford Pax   Reason for Visit/CC: Post Procedural F/u s/p [facility]; Aortic Stenosis and CAD  HPI:  [name] is a 66 y.o. male, followed by Dr. Radford Pax with a h/o CAD, aortic stenosis and systolic HF. He suffered a non-STEMI in 2013 treated with PCI of the LCx/OM. LHC in 5/14 demonstrated severe 2 vessel CAD with critical in-stent restenosis in the LCx. He underwent CABG (SVG-PDA, SVG-LCx). He also has a history of aortic stenosis.  He recently had a f/u 2D echo 10/16/16 for assessment of his aortic valve. This demonstrated notable reduction in left ventricular systolic function with a new ejection fraction of 35-40% with diffuse hypokinesis. Visualization of his  aortic valve revealed restricted mobility. There was mild stenosis with moderate regurgitation. Gradients weren't significantly uncharged. Mean gradient 16 mm Hg. However there was concern for possible low flow, low gradient severe aortic stenosis. Subsequently, he was referred for [facility]. This was performed by Dr. Ellyn Hack 11/15/16. This revealed mild-moderate aortic valve stenosis. AVA 0.837 cm2. Moderate LV dysfunction. The LVEF is ~45%, which was higher than echo report. His coronary anatomy was as follows. Patent SVG-rPDA & SVG-OM1 with occluded native vessels. Mild-Moderate LAD-Diag disease. No indication for PCI. Continued medical therapy recommended.   He presents to clinic today for f/u. He is doing well. No post cath complications. Right radial cath site is stable. He denies CP, dyspnea, syncope/ near syncope. No exertional symtoms. He reports full medication compliance. BP is 138/60. EKG shows sinus tach with rate of 104 bpm. He monitors his HR regularly at home and HR is usually in the upper 90s, per patient report.    Current Meds  Medication Sig  . amitriptyline (ELAVIL) 100 MG tablet Take 100 mg by mouth at bedtime.  .  B Complex-C (SUPER B COMPLEX PO) Take 1 tablet by mouth daily.  . B-D ULTRAFINE III SHORT PEN 31G X 8 MM MISC 1 each by Other route daily as needed (GLUCOSE TESTING (INSULINE NEEDLE)).   . Cholecalciferol (VITAMIN D-3) 5000 units TABS Take 5,000 Units by mouth daily.  Marland Kitchen HUMALOG KWIKPEN 100 UNIT/ML KiwkPen Inject 6-14 Units into the skin 3 (three) times daily with meals. Patient's dose is done on a sliding scale.  . hydroxypropyl methylcellulose / hypromellose (ISOPTO TEARS / GONIOVISC) 2.5 % ophthalmic solution Place 1 drop into both eyes 4 (four) times daily as needed for dry eyes.  . iron polysaccharides (NIFEREX) 150 MG capsule Take 150 mg by mouth at bedtime.   Marland Kitchen LANTUS SOLOSTAR 100 UNIT/ML Solostar Pen Inject 42 Units into the skin daily.   . metFORMIN (GLUCOPHAGE) 500 MG tablet Take 1,000 mg by mouth 2 (two) times daily with a meal.  . metoprolol succinate (TOPROL-XL) 50 MG 24 hr tablet Take 1 and 1/2 tablets (42m) by mouth daily. Take with or immediately following a meal.  . Multiple Vitamin (MULTIVITAMIN) tablet Take 1 tablet by mouth daily.  . Omega-3 Fatty Acids (FISH OIL MAXIMUM STRENGTH) 1200 MG CAPS Take 3,600 mg by mouth daily.   . ONE TOUCH ULTRA TEST test strip 1 each by Other route as needed for other.   . simvastatin (ZOCOR) 40 MG tablet Take 40 mg by mouth every evening.  . vitamin B-12 (CYANOCOBALAMIN) 1000 MCG tablet Take 1,000 mcg by mouth daily.  .Marland Kitchenzinc gluconate 50 MG tablet Take 50 mg by mouth daily.  .Marland Kitchenzolpidem (AMBIEN)  10 MG tablet Take 10 mg by mouth at bedtime.   . [DISCONTINUED] metoprolol succinate (TOPROL-XL) 50 MG 24 hr tablet TAKE 1 TABLET BY MOUTH EVERY DAY. TAKE WITH OR IMMEDIATELY FOLLOWING A MEAL   Allergies  Allergen Reactions  . Lipitor [Atorvastatin] Other (See Comments)    myalgias  . Adhesive [Tape] Rash   Past Medical History:  Diagnosis Date  . AAA (abdominal aortic aneurysm) (HCC)    (-)11/11,CT abdomen-no aneurysm aortic  . Aortic stenosis,  mild 06/27/2014   Echo 3/17: EF 55-60%, normal wall motion, normal diastolic function, mild aortic stenosis (mean 17 mmHg), moderate AI, MAC  . Cancer [facility])    mediastinal seminoma resected 1985 - benign  . Chest pain   . Chronic kidney disease    kidney stones s/p lithotripsy  . Coronary artery disease 02/2012   60% mid RCA, 80% prox left circ, 99% distal left circ, normal LAD s/p PCI left circ/OM, 02/2012 -now s/p CABG  // ETT 3/17: No ischemic changes, Hypertensive BP response.  . Diabetes mellitus    neuropathy  insulin dependent  . Dyslipidemia   . Elevated cholesterol   . Erectile dysfunction   . Erectile dysfunction   . GERD (gastroesophageal reflux disease)   . Hypertension   . Insomnia   . Insomnia   . NSTEMI (non-ST elevated myocardial infarction) ([place])   . Shortness of breath   . Stroke ([place])    TIA's  . TIA (transient ischemic attack)    Family History  Problem Relation Age of Onset  . Diabetes Father   . Diabetes Mother   . Heart failure Mother   . Hypertension Mother   . Cancer Brother   . Diabetes Sister    Past Surgical History:  Procedure Laterality Date  . COLONOSCOPY  07/25/2012   Procedure: COLONOSCOPY;  Surgeon: Winfield Cunas., MD;  Location: [facility] ENDOSCOPY;  Service: Endoscopy;  Laterality: N/A;  . COLONOSCOPY N/A 12/02/2013   Procedure: COLONOSCOPY;  Surgeon: Winfield Cunas., MD;  Location: [facility] ENDOSCOPY;  Service: Endoscopy;  Laterality: N/A;  . CORONARY ARTERY BYPASS GRAFT N/A 01/04/2013   Procedure: CORONARY ARTERY BYPASS GRAFTING (CABG) times two using right saphenous vein harvested with endoscope.;  Surgeon: Ivin Poot, MD;  Location: [place];  Service: Open Heart Surgery;  Laterality: N/A;  . ESOPHAGOGASTRODUODENOSCOPY  07/20/2012   Procedure: ESOPHAGOGASTRODUODENOSCOPY (EGD);  Surgeon: Winfield Cunas., MD;  Location: [facility] ENDOSCOPY;  Service: Endoscopy;  Laterality: N/A;  . ESOPHAGOGASTRODUODENOSCOPY N/A 12/02/2013   Procedure:  ESOPHAGOGASTRODUODENOSCOPY (EGD);  Surgeon: Winfield Cunas., MD;  Location: [facility] ENDOSCOPY;  Service: Endoscopy;  Laterality: N/A;  . HOT HEMOSTASIS N/A 12/02/2013   Procedure: HOT HEMOSTASIS (ARGON PLASMA COAGULATION/BICAP);  Surgeon: Winfield Cunas., MD;  Location: [facility] ENDOSCOPY;  Service: Endoscopy;  Laterality: N/A;  . INTRAOPERATIVE TRANSESOPHAGEAL ECHOCARDIOGRAM N/A 01/04/2013   Procedure: INTRAOPERATIVE TRANSESOPHAGEAL ECHOCARDIOGRAM;  Surgeon: Ivin Poot, MD;  Location: [place];  Service: Open Heart Surgery;  Laterality: N/A;  . LEFT HEART CATHETERIZATION WITH CORONARY ANGIOGRAM N/A 03/06/2012   Procedure: LEFT HEART CATHETERIZATION WITH CORONARY ANGIOGRAM;  Surgeon: [name], MD;  Location: [place] CATH LAB;  Service: Cardiovascular;  Laterality: N/A;  . lithotripsy    . RADIAL ARTERY HARVEST Left 01/04/2013   Procedure: RADIAL ARTERY HARVEST;  Surgeon: Ivin Poot, MD;  Location: [place];  Service: Vascular;  Laterality: Left;  Artery not havested. Unsuitable for use.  . resection mediastinal seminonma    .  RIGHT/LEFT HEART CATH AND CORONARY/GRAFT ANGIOGRAPHY N/A 11/15/2016   Procedure: Right/Left Heart Cath and Coronary/Graft Angiography;  Surgeon: Leonie Man, MD;  Location: [place] CV LAB;  Service: Cardiovascular;  Laterality: N/A;   Social History   Social History  . Marital status: Married    Spouse name: N/A  . Number of children: N/A  . Years of education: N/A   Occupational History  . Not on file.   Social History Main Topics  . Smoking status: Former Smoker    Quit date: 03/06/1985  . Smokeless tobacco: Never Used  . Alcohol use No  . Drug use: No  . Sexual activity: Not Currently   Other Topics Concern  . Not on file   Social History Narrative  . No narrative on file     Review of Systems: General: negative for chills, fever, night sweats or weight changes.  Cardiovascular: negative for chest pain, dyspnea on exertion, edema, orthopnea,  palpitations, paroxysmal nocturnal dyspnea or shortness of breath Dermatological: negative for rash Respiratory: negative for cough or wheezing Urologic: negative for hematuria Abdominal: negative for nausea, vomiting, diarrhea, bright red blood per rectum, melena, or hematemesis Neurologic: negative for visual changes, syncope, or dizziness All other systems reviewed and are otherwise negative except as noted above.   Physical Exam:  Blood pressure 138/60, pulse (!) 104, height _0  (1.803 m), weight 189 lb 6.4 oz (85.9 kg).  General appearance: alert, cooperative and no distress Neck: no carotid bruit and no JVD Lungs: clear to auscultation bilaterally Heart: regular rate and rhythm and 2/6 AS murmur, loudest at RUSB Extremities: extremities normal, atraumatic, no cyanosis or edema Pulses: 2+ and symmetric Skin: Skin color, texture, turgor normal. No rashes or lesions Neurologic: Grossly normal  EKG sinus tach, 104 bpm  -- personally reviewed   ASSESSMENT AND PLAN:   1. Aortic Stenosis: recently assess by cath. Felt to be mild-moderate. AVA 0.837 cm2. No symptoms. Continue to monitor.   2. CAD: s/p CABG in 2014 (SVG-PDA, SVG-LCx). Recent LHC with patent grafts. Stable disease. No angina. Continue medical therapy. His HR is elevated in clinic today and per patient report, is usually in the upper 90s at home. BP is 138/60. We will increase his metoprolol from 50 to 75 mg daily. Continue statin.   3. Systolic Dysfunction: EF estimated at 35-40% on recent 2D echo however 45% by cath estimate. No s/s of acute CHF. Increase BB to 75 mg daily for increased HR.   Melanie Openshaw Ladoris Gene, MHS [facility] 12/08/2016 12:54 PM

## 2016-12-09 ENCOUNTER — Ambulatory Visit: Payer: Medicare Other | Admitting: Cardiology

## 2016-12-09 ENCOUNTER — Encounter (HOSPITAL_COMMUNITY)
Admission: RE | Admit: 2016-12-09 | Discharge: 2016-12-09 | Disposition: A | Discharge status: Home / Self Care | Payer: Medicare Other | Source: Ambulatory Visit | Attending: Cardiology | Admitting: Cardiology

## 2016-12-13 ENCOUNTER — Encounter (HOSPITAL_COMMUNITY)
Admission: RE | Admit: 2016-12-13 | Discharge: 2016-12-13 | Disposition: A | Discharge status: Home / Self Care | Payer: Medicare Other | Source: Ambulatory Visit | Attending: Cardiology | Admitting: Cardiology

## 2016-12-13 DIAGNOSIS — I2510 Atherosclerotic heart disease of native coronary artery without angina pectoris: Secondary | ICD-10-CM | POA: Insufficient documentation

## 2016-12-15 ENCOUNTER — Encounter (HOSPITAL_COMMUNITY)
Admission: RE | Admit: 2016-12-15 | Discharge: 2016-12-15 | Disposition: A | Discharge status: Home / Self Care | Payer: Medicare Other | Source: Ambulatory Visit | Attending: Cardiology | Admitting: Cardiology

## 2016-12-16 ENCOUNTER — Encounter (HOSPITAL_COMMUNITY)
Admission: RE | Admit: 2016-12-16 | Discharge: 2016-12-16 | Disposition: A | Discharge status: Home / Self Care | Payer: Medicare Other | Source: Ambulatory Visit | Attending: Cardiology | Admitting: Cardiology

## 2016-12-20 ENCOUNTER — Encounter (HOSPITAL_COMMUNITY)
Admission: RE | Admit: 2016-12-20 | Discharge: 2016-12-20 | Disposition: A | Discharge status: Home / Self Care | Payer: Medicare Other | Source: Ambulatory Visit | Attending: Cardiology | Admitting: Cardiology

## 2016-12-22 ENCOUNTER — Encounter (HOSPITAL_COMMUNITY)
Admission: RE | Admit: 2016-12-22 | Discharge: 2016-12-22 | Disposition: A | Discharge status: Home / Self Care | Payer: Medicare Other | Source: Ambulatory Visit | Attending: Cardiology | Admitting: Cardiology

## 2016-12-23 ENCOUNTER — Encounter (HOSPITAL_COMMUNITY)
Admission: RE | Admit: 2016-12-23 | Discharge: 2016-12-23 | Disposition: A | Discharge status: Home / Self Care | Payer: Medicare Other | Source: Ambulatory Visit | Attending: Cardiology | Admitting: Cardiology

## 2016-12-23 NOTE — Progress Notes (Signed)
[name]'s blood sugar was noted at 57 per his continuous glucose monitor. Patient was given a pack of smarties candy ginger ale. Exercise stopped. [name] said he ate at 35. Patient instructed to make sure he eats breakfast prior to coming to exercise at cardiac rehab.  [name] worked last night.[name]'s blood sugar came up to 74 and then 122. [name] left cardiac rehab maintenance without complaints.Barnet Pall, RN,BSN 12/23/2016 2:07 PM

## 2016-12-27 ENCOUNTER — Encounter (HOSPITAL_COMMUNITY)
Admission: RE | Admit: 2016-12-27 | Discharge: 2016-12-27 | Disposition: A | Discharge status: Home / Self Care | Payer: Medicare Other | Source: Ambulatory Visit | Attending: Cardiology | Admitting: Cardiology

## 2016-12-28 DIAGNOSIS — I10 Essential (primary) hypertension: Secondary | ICD-10-CM | POA: Diagnosis not present

## 2016-12-28 DIAGNOSIS — E11319 Type 2 diabetes mellitus with unspecified diabetic retinopathy without macular edema: Secondary | ICD-10-CM | POA: Diagnosis not present

## 2016-12-28 DIAGNOSIS — N189 Chronic kidney disease, unspecified: Secondary | ICD-10-CM | POA: Diagnosis not present

## 2016-12-28 DIAGNOSIS — E1165 Type 2 diabetes mellitus with hyperglycemia: Secondary | ICD-10-CM | POA: Diagnosis not present

## 2016-12-28 DIAGNOSIS — E7800 Pure hypercholesterolemia, unspecified: Secondary | ICD-10-CM | POA: Diagnosis not present

## 2016-12-29 ENCOUNTER — Encounter (HOSPITAL_COMMUNITY)
Admission: RE | Admit: 2016-12-29 | Discharge: 2016-12-29 | Disposition: A | Discharge status: Home / Self Care | Payer: Medicare Other | Source: Ambulatory Visit | Attending: Cardiology | Admitting: Cardiology

## 2016-12-30 ENCOUNTER — Encounter (HOSPITAL_COMMUNITY)
Admission: RE | Admit: 2016-12-30 | Discharge: 2016-12-30 | Disposition: A | Discharge status: Home / Self Care | Payer: Medicare Other | Source: Ambulatory Visit | Attending: Cardiology | Admitting: Cardiology

## 2017-01-03 ENCOUNTER — Encounter (HOSPITAL_COMMUNITY)
Admission: RE | Admit: 2017-01-03 | Discharge: 2017-01-03 | Disposition: A | Discharge status: Home / Self Care | Payer: Medicare Other | Source: Ambulatory Visit | Attending: Cardiology | Admitting: Cardiology

## 2017-01-05 ENCOUNTER — Encounter (HOSPITAL_COMMUNITY)
Admission: RE | Admit: 2017-01-05 | Discharge: 2017-01-05 | Disposition: A | Discharge status: Home / Self Care | Payer: Medicare Other | Source: Ambulatory Visit | Attending: Cardiology | Admitting: Cardiology

## 2017-01-06 ENCOUNTER — Encounter (HOSPITAL_COMMUNITY)
Admission: RE | Admit: 2017-01-06 | Discharge: 2017-01-06 | Disposition: A | Discharge status: Home / Self Care | Payer: Medicare Other | Source: Ambulatory Visit | Attending: Cardiology | Admitting: Cardiology

## 2017-01-10 ENCOUNTER — Encounter (HOSPITAL_COMMUNITY)
Admission: RE | Admit: 2017-01-10 | Discharge: 2017-01-10 | Disposition: A | Discharge status: Home / Self Care | Payer: Medicare Other | Source: Ambulatory Visit | Attending: Cardiology | Admitting: Cardiology

## 2017-01-12 ENCOUNTER — Encounter (HOSPITAL_COMMUNITY)
Admission: RE | Admit: 2017-01-12 | Discharge: 2017-01-12 | Disposition: A | Discharge status: Home / Self Care | Payer: Medicare Other | Source: Ambulatory Visit | Attending: Cardiology | Admitting: Cardiology

## 2017-01-13 ENCOUNTER — Encounter (HOSPITAL_COMMUNITY): Payer: Medicare Other

## 2017-01-13 DIAGNOSIS — I2510 Atherosclerotic heart disease of native coronary artery without angina pectoris: Secondary | ICD-10-CM | POA: Insufficient documentation

## 2017-01-17 ENCOUNTER — Encounter (HOSPITAL_COMMUNITY)
Admission: RE | Admit: 2017-01-17 | Discharge: 2017-01-17 | Disposition: A | Discharge status: Home / Self Care | Payer: Medicare Other | Source: Ambulatory Visit | Attending: Cardiology | Admitting: Cardiology

## 2017-01-19 ENCOUNTER — Encounter (HOSPITAL_COMMUNITY)
Admission: RE | Admit: 2017-01-19 | Discharge: 2017-01-19 | Disposition: A | Discharge status: Home / Self Care | Payer: Medicare Other | Source: Ambulatory Visit | Attending: Cardiology | Admitting: Cardiology

## 2017-01-20 ENCOUNTER — Encounter (HOSPITAL_COMMUNITY)
Admission: RE | Admit: 2017-01-20 | Discharge: 2017-01-20 | Disposition: A | Discharge status: Home / Self Care | Payer: Medicare Other | Source: Ambulatory Visit | Attending: Cardiology | Admitting: Cardiology

## 2017-01-24 ENCOUNTER — Encounter (HOSPITAL_COMMUNITY): Payer: Medicare Other

## 2017-01-24 DIAGNOSIS — Z136 Encounter for screening for cardiovascular disorders: Secondary | ICD-10-CM | POA: Diagnosis not present

## 2017-01-24 DIAGNOSIS — D500 Iron deficiency anemia secondary to blood loss (chronic): Secondary | ICD-10-CM | POA: Diagnosis not present

## 2017-01-24 DIAGNOSIS — B351 Tinea unguium: Secondary | ICD-10-CM | POA: Diagnosis not present

## 2017-01-24 DIAGNOSIS — Z125 Encounter for screening for malignant neoplasm of prostate: Secondary | ICD-10-CM | POA: Diagnosis not present

## 2017-01-24 DIAGNOSIS — I2510 Atherosclerotic heart disease of native coronary artery without angina pectoris: Secondary | ICD-10-CM | POA: Diagnosis not present

## 2017-01-24 DIAGNOSIS — E7800 Pure hypercholesterolemia, unspecified: Secondary | ICD-10-CM | POA: Diagnosis not present

## 2017-01-24 DIAGNOSIS — Z0001 Encounter for general adult medical examination with abnormal findings: Secondary | ICD-10-CM | POA: Diagnosis not present

## 2017-01-24 DIAGNOSIS — I10 Essential (primary) hypertension: Secondary | ICD-10-CM | POA: Diagnosis not present

## 2017-01-24 DIAGNOSIS — E1122 Type 2 diabetes mellitus with diabetic chronic kidney disease: Secondary | ICD-10-CM | POA: Diagnosis not present

## 2017-01-24 DIAGNOSIS — N183 Chronic kidney disease, stage 3 (moderate): Secondary | ICD-10-CM | POA: Diagnosis not present

## 2017-01-24 DIAGNOSIS — Z23 Encounter for immunization: Secondary | ICD-10-CM | POA: Diagnosis not present

## 2017-01-24 DIAGNOSIS — G4700 Insomnia, unspecified: Secondary | ICD-10-CM | POA: Diagnosis not present

## 2017-01-26 ENCOUNTER — Other Ambulatory Visit: Payer: Self-pay | Admitting: Family Medicine

## 2017-01-26 ENCOUNTER — Encounter (HOSPITAL_COMMUNITY)
Admission: RE | Admit: 2017-01-26 | Discharge: 2017-01-26 | Disposition: A | Discharge status: Home / Self Care | Payer: Medicare Other | Source: Ambulatory Visit | Attending: Cardiology | Admitting: Cardiology

## 2017-01-26 DIAGNOSIS — Z136 Encounter for screening for cardiovascular disorders: Secondary | ICD-10-CM

## 2017-01-27 ENCOUNTER — Encounter (HOSPITAL_COMMUNITY)
Admission: RE | Admit: 2017-01-27 | Discharge: 2017-01-27 | Disposition: A | Discharge status: Home / Self Care | Payer: Medicare Other | Source: Ambulatory Visit | Attending: Cardiology | Admitting: Cardiology

## 2017-01-31 ENCOUNTER — Encounter (HOSPITAL_COMMUNITY)
Admission: RE | Admit: 2017-01-31 | Discharge: 2017-01-31 | Disposition: A | Discharge status: Home / Self Care | Payer: Medicare Other | Source: Ambulatory Visit | Attending: Cardiology | Admitting: Cardiology

## 2017-02-02 ENCOUNTER — Encounter (HOSPITAL_COMMUNITY)
Admission: RE | Admit: 2017-02-02 | Discharge: 2017-02-02 | Disposition: A | Discharge status: Home / Self Care | Payer: Medicare Other | Source: Ambulatory Visit | Attending: Cardiology | Admitting: Cardiology

## 2017-02-03 ENCOUNTER — Encounter (HOSPITAL_COMMUNITY)
Admission: RE | Admit: 2017-02-03 | Discharge: 2017-02-03 | Disposition: A | Discharge status: Home / Self Care | Payer: Medicare Other | Source: Ambulatory Visit | Attending: Cardiology | Admitting: Cardiology

## 2017-02-07 ENCOUNTER — Encounter (HOSPITAL_COMMUNITY)
Admission: RE | Admit: 2017-02-07 | Discharge: 2017-02-07 | Disposition: A | Discharge status: Home / Self Care | Payer: Medicare Other | Source: Ambulatory Visit | Attending: Cardiology | Admitting: Cardiology

## 2017-02-08 ENCOUNTER — Ambulatory Visit
Admission: RE | Admit: 2017-02-08 | Discharge: 2017-02-08 | Disposition: A | Discharge status: Home / Self Care | Payer: Medicare Other | Source: Ambulatory Visit | Attending: Family Medicine | Admitting: Family Medicine

## 2017-02-08 DIAGNOSIS — Z136 Encounter for screening for cardiovascular disorders: Secondary | ICD-10-CM

## 2017-02-08 DIAGNOSIS — Z87891 Personal history of nicotine dependence: Secondary | ICD-10-CM | POA: Diagnosis not present

## 2017-02-09 ENCOUNTER — Encounter (HOSPITAL_COMMUNITY)
Admission: RE | Admit: 2017-02-09 | Discharge: 2017-02-09 | Disposition: A | Discharge status: Home / Self Care | Payer: Medicare Other | Source: Ambulatory Visit | Attending: Cardiology | Admitting: Cardiology

## 2017-02-10 ENCOUNTER — Encounter (HOSPITAL_COMMUNITY)
Admission: RE | Admit: 2017-02-10 | Discharge: 2017-02-10 | Disposition: A | Discharge status: Home / Self Care | Payer: Medicare Other | Source: Ambulatory Visit | Attending: Cardiology | Admitting: Cardiology

## 2017-02-14 ENCOUNTER — Encounter (HOSPITAL_COMMUNITY)
Admission: RE | Admit: 2017-02-14 | Discharge: 2017-02-14 | Disposition: A | Discharge status: Home / Self Care | Payer: Self-pay | Source: Ambulatory Visit | Attending: Cardiology | Admitting: Cardiology

## 2017-02-14 DIAGNOSIS — I2510 Atherosclerotic heart disease of native coronary artery without angina pectoris: Secondary | ICD-10-CM | POA: Insufficient documentation

## 2017-02-16 ENCOUNTER — Encounter (HOSPITAL_COMMUNITY)
Admission: RE | Admit: 2017-02-16 | Discharge: 2017-02-16 | Disposition: A | Discharge status: Home / Self Care | Payer: Self-pay | Source: Ambulatory Visit | Attending: Cardiology | Admitting: Cardiology

## 2017-02-17 ENCOUNTER — Encounter (HOSPITAL_COMMUNITY)
Admission: RE | Admit: 2017-02-17 | Discharge: 2017-02-17 | Disposition: A | Discharge status: Home / Self Care | Payer: Self-pay | Source: Ambulatory Visit | Attending: Cardiology | Admitting: Cardiology

## 2017-02-21 ENCOUNTER — Encounter (HOSPITAL_COMMUNITY)
Admission: RE | Admit: 2017-02-21 | Discharge: 2017-02-21 | Disposition: A | Discharge status: Home / Self Care | Payer: Self-pay | Source: Ambulatory Visit | Attending: Cardiology | Admitting: Cardiology

## 2017-02-23 ENCOUNTER — Encounter (HOSPITAL_COMMUNITY)
Admission: RE | Admit: 2017-02-23 | Discharge: 2017-02-23 | Disposition: A | Discharge status: Home / Self Care | Payer: Self-pay | Source: Ambulatory Visit | Attending: Cardiology | Admitting: Cardiology

## 2017-02-24 ENCOUNTER — Encounter (HOSPITAL_COMMUNITY)
Admission: RE | Admit: 2017-02-24 | Discharge: 2017-02-24 | Disposition: A | Discharge status: Home / Self Care | Payer: Self-pay | Source: Ambulatory Visit | Attending: Cardiology | Admitting: Cardiology

## 2017-02-28 ENCOUNTER — Encounter (HOSPITAL_COMMUNITY)
Admission: RE | Admit: 2017-02-28 | Discharge: 2017-02-28 | Disposition: A | Discharge status: Home / Self Care | Payer: Self-pay | Source: Ambulatory Visit | Attending: Cardiology | Admitting: Cardiology

## 2017-03-02 ENCOUNTER — Encounter (HOSPITAL_COMMUNITY)
Admission: RE | Admit: 2017-03-02 | Discharge: 2017-03-02 | Disposition: A | Discharge status: Home / Self Care | Payer: Self-pay | Source: Ambulatory Visit | Attending: Cardiology | Admitting: Cardiology

## 2017-03-03 ENCOUNTER — Encounter (HOSPITAL_COMMUNITY)
Admission: RE | Admit: 2017-03-03 | Discharge: 2017-03-03 | Disposition: A | Discharge status: Home / Self Care | Payer: Self-pay | Source: Ambulatory Visit | Attending: Cardiology | Admitting: Cardiology

## 2017-03-07 ENCOUNTER — Encounter (HOSPITAL_COMMUNITY)
Admission: RE | Admit: 2017-03-07 | Discharge: 2017-03-07 | Disposition: A | Discharge status: Home / Self Care | Payer: Self-pay | Source: Ambulatory Visit | Attending: Cardiology | Admitting: Cardiology

## 2017-03-08 DIAGNOSIS — B351 Tinea unguium: Secondary | ICD-10-CM | POA: Diagnosis not present

## 2017-03-09 ENCOUNTER — Encounter (HOSPITAL_COMMUNITY)
Admission: RE | Admit: 2017-03-09 | Discharge: 2017-03-09 | Disposition: A | Discharge status: Home / Self Care | Payer: Self-pay | Source: Ambulatory Visit | Attending: Cardiology | Admitting: Cardiology

## 2017-03-10 ENCOUNTER — Encounter (HOSPITAL_COMMUNITY)
Admission: RE | Admit: 2017-03-10 | Discharge: 2017-03-10 | Disposition: A | Discharge status: Home / Self Care | Payer: Self-pay | Source: Ambulatory Visit | Attending: Cardiology | Admitting: Cardiology

## 2017-03-14 ENCOUNTER — Encounter (HOSPITAL_COMMUNITY)
Admission: RE | Admit: 2017-03-14 | Discharge: 2017-03-14 | Disposition: A | Discharge status: Home / Self Care | Payer: Self-pay | Source: Ambulatory Visit | Attending: Cardiology | Admitting: Cardiology

## 2017-03-16 ENCOUNTER — Encounter (HOSPITAL_COMMUNITY)
Admission: RE | Admit: 2017-03-16 | Discharge: 2017-03-16 | Disposition: A | Discharge status: Home / Self Care | Payer: Self-pay | Source: Ambulatory Visit | Attending: Cardiology | Admitting: Cardiology

## 2017-03-16 DIAGNOSIS — I2510 Atherosclerotic heart disease of native coronary artery without angina pectoris: Secondary | ICD-10-CM | POA: Insufficient documentation

## 2017-03-17 ENCOUNTER — Encounter (HOSPITAL_COMMUNITY)
Admission: RE | Admit: 2017-03-17 | Discharge: 2017-03-17 | Disposition: A | Discharge status: Home / Self Care | Payer: Self-pay | Source: Ambulatory Visit | Attending: Cardiology | Admitting: Cardiology

## 2017-03-21 ENCOUNTER — Encounter (HOSPITAL_COMMUNITY)
Admission: RE | Admit: 2017-03-21 | Discharge: 2017-03-21 | Disposition: A | Discharge status: Home / Self Care | Payer: Self-pay | Source: Ambulatory Visit | Attending: Cardiology | Admitting: Cardiology

## 2017-03-23 ENCOUNTER — Encounter (HOSPITAL_COMMUNITY)
Admission: RE | Admit: 2017-03-23 | Discharge: 2017-03-23 | Disposition: A | Discharge status: Home / Self Care | Payer: Self-pay | Source: Ambulatory Visit | Attending: Cardiology | Admitting: Cardiology

## 2017-03-24 ENCOUNTER — Encounter (HOSPITAL_COMMUNITY)
Admission: RE | Admit: 2017-03-24 | Discharge: 2017-03-24 | Disposition: A | Discharge status: Home / Self Care | Payer: Self-pay | Source: Ambulatory Visit | Attending: Cardiology | Admitting: Cardiology

## 2017-03-28 ENCOUNTER — Encounter (HOSPITAL_COMMUNITY)
Admission: RE | Admit: 2017-03-28 | Discharge: 2017-03-28 | Disposition: A | Discharge status: Home / Self Care | Payer: Self-pay | Source: Ambulatory Visit | Attending: Cardiology | Admitting: Cardiology

## 2017-03-30 ENCOUNTER — Encounter (HOSPITAL_COMMUNITY)
Admission: RE | Admit: 2017-03-30 | Discharge: 2017-03-30 | Disposition: A | Discharge status: Home / Self Care | Payer: Self-pay | Source: Ambulatory Visit | Attending: Cardiology | Admitting: Cardiology

## 2017-03-31 ENCOUNTER — Encounter (HOSPITAL_COMMUNITY)
Admission: RE | Admit: 2017-03-31 | Discharge: 2017-03-31 | Disposition: A | Discharge status: Home / Self Care | Payer: Self-pay | Source: Ambulatory Visit | Attending: Cardiology | Admitting: Cardiology

## 2017-04-04 ENCOUNTER — Encounter (HOSPITAL_COMMUNITY)
Admission: RE | Admit: 2017-04-04 | Discharge: 2017-04-04 | Disposition: A | Discharge status: Home / Self Care | Payer: Self-pay | Source: Ambulatory Visit | Attending: Cardiology | Admitting: Cardiology

## 2017-04-06 ENCOUNTER — Encounter (HOSPITAL_COMMUNITY)
Admission: RE | Admit: 2017-04-06 | Discharge: 2017-04-06 | Disposition: A | Discharge status: Home / Self Care | Payer: Self-pay | Source: Ambulatory Visit | Attending: Cardiology | Admitting: Cardiology

## 2017-04-07 ENCOUNTER — Encounter (HOSPITAL_COMMUNITY)
Admission: RE | Admit: 2017-04-07 | Discharge: 2017-04-07 | Disposition: A | Discharge status: Home / Self Care | Payer: Self-pay | Source: Ambulatory Visit | Attending: Cardiology | Admitting: Cardiology

## 2017-04-11 ENCOUNTER — Encounter (HOSPITAL_COMMUNITY): Payer: Self-pay

## 2017-04-13 ENCOUNTER — Encounter (HOSPITAL_COMMUNITY)
Admission: RE | Admit: 2017-04-13 | Discharge: 2017-04-13 | Disposition: A | Discharge status: Home / Self Care | Payer: Self-pay | Source: Ambulatory Visit | Attending: Cardiology | Admitting: Cardiology

## 2017-04-14 ENCOUNTER — Encounter (HOSPITAL_COMMUNITY)
Admission: RE | Admit: 2017-04-14 | Discharge: 2017-04-14 | Disposition: A | Discharge status: Home / Self Care | Payer: Self-pay | Source: Ambulatory Visit | Attending: Cardiology | Admitting: Cardiology

## 2017-04-18 ENCOUNTER — Encounter (HOSPITAL_COMMUNITY)
Admission: RE | Admit: 2017-04-18 | Discharge: 2017-04-18 | Disposition: A | Discharge status: Home / Self Care | Payer: Self-pay | Source: Ambulatory Visit | Attending: Cardiology | Admitting: Cardiology

## 2017-04-18 DIAGNOSIS — I2510 Atherosclerotic heart disease of native coronary artery without angina pectoris: Secondary | ICD-10-CM | POA: Insufficient documentation

## 2017-04-20 ENCOUNTER — Encounter (HOSPITAL_COMMUNITY)
Admission: RE | Admit: 2017-04-20 | Discharge: 2017-04-20 | Disposition: A | Discharge status: Home / Self Care | Payer: Self-pay | Source: Ambulatory Visit | Attending: Cardiology | Admitting: Cardiology

## 2017-04-21 ENCOUNTER — Encounter (HOSPITAL_COMMUNITY)
Admission: RE | Admit: 2017-04-21 | Discharge: 2017-04-21 | Disposition: A | Discharge status: Home / Self Care | Payer: Self-pay | Source: Ambulatory Visit | Attending: Cardiology | Admitting: Cardiology

## 2017-04-25 ENCOUNTER — Encounter (HOSPITAL_COMMUNITY)
Admission: RE | Admit: 2017-04-25 | Discharge: 2017-04-25 | Disposition: A | Discharge status: Home / Self Care | Payer: Self-pay | Source: Ambulatory Visit | Attending: Cardiology | Admitting: Cardiology

## 2017-04-27 ENCOUNTER — Encounter (HOSPITAL_COMMUNITY)
Admission: RE | Admit: 2017-04-27 | Discharge: 2017-04-27 | Disposition: A | Discharge status: Home / Self Care | Payer: Self-pay | Source: Ambulatory Visit | Attending: Cardiology | Admitting: Cardiology

## 2017-04-28 ENCOUNTER — Encounter (HOSPITAL_COMMUNITY)
Admission: RE | Admit: 2017-04-28 | Discharge: 2017-04-28 | Disposition: A | Discharge status: Home / Self Care | Payer: Self-pay | Source: Ambulatory Visit | Attending: Cardiology | Admitting: Cardiology

## 2017-05-02 ENCOUNTER — Encounter (HOSPITAL_COMMUNITY)
Admission: RE | Admit: 2017-05-02 | Discharge: 2017-05-02 | Disposition: A | Discharge status: Home / Self Care | Payer: Self-pay | Source: Ambulatory Visit | Attending: Cardiology | Admitting: Cardiology

## 2017-05-04 ENCOUNTER — Encounter (HOSPITAL_COMMUNITY)
Admission: RE | Admit: 2017-05-04 | Discharge: 2017-05-04 | Disposition: A | Discharge status: Home / Self Care | Payer: Self-pay | Source: Ambulatory Visit | Attending: Cardiology | Admitting: Cardiology

## 2017-05-05 ENCOUNTER — Encounter (HOSPITAL_COMMUNITY)
Admission: RE | Admit: 2017-05-05 | Discharge: 2017-05-05 | Disposition: A | Discharge status: Home / Self Care | Payer: Self-pay | Source: Ambulatory Visit | Attending: Cardiology | Admitting: Cardiology

## 2017-05-09 ENCOUNTER — Encounter (HOSPITAL_COMMUNITY)
Admission: RE | Admit: 2017-05-09 | Discharge: 2017-05-09 | Disposition: A | Discharge status: Home / Self Care | Payer: Self-pay | Source: Ambulatory Visit | Attending: Cardiology | Admitting: Cardiology

## 2017-05-10 DIAGNOSIS — B351 Tinea unguium: Secondary | ICD-10-CM | POA: Diagnosis not present

## 2017-05-10 DIAGNOSIS — Z23 Encounter for immunization: Secondary | ICD-10-CM | POA: Diagnosis not present

## 2017-05-10 DIAGNOSIS — N183 Chronic kidney disease, stage 3 (moderate): Secondary | ICD-10-CM | POA: Diagnosis not present

## 2017-05-10 DIAGNOSIS — E1122 Type 2 diabetes mellitus with diabetic chronic kidney disease: Secondary | ICD-10-CM | POA: Diagnosis not present

## 2017-05-10 DIAGNOSIS — I10 Essential (primary) hypertension: Secondary | ICD-10-CM | POA: Diagnosis not present

## 2017-05-11 ENCOUNTER — Encounter (HOSPITAL_COMMUNITY)
Admission: RE | Admit: 2017-05-11 | Discharge: 2017-05-11 | Disposition: A | Discharge status: Home / Self Care | Payer: Self-pay | Source: Ambulatory Visit | Attending: Cardiology | Admitting: Cardiology

## 2017-05-12 ENCOUNTER — Encounter (HOSPITAL_COMMUNITY)
Admission: RE | Admit: 2017-05-12 | Discharge: 2017-05-12 | Disposition: A | Discharge status: Home / Self Care | Payer: Self-pay | Source: Ambulatory Visit | Attending: Cardiology | Admitting: Cardiology

## 2017-05-16 ENCOUNTER — Encounter (HOSPITAL_COMMUNITY)
Admission: RE | Admit: 2017-05-16 | Discharge: 2017-05-16 | Disposition: A | Discharge status: Home / Self Care | Payer: Self-pay | Source: Ambulatory Visit | Attending: Cardiology | Admitting: Cardiology

## 2017-05-16 DIAGNOSIS — I2510 Atherosclerotic heart disease of native coronary artery without angina pectoris: Secondary | ICD-10-CM | POA: Insufficient documentation

## 2017-05-18 ENCOUNTER — Encounter (HOSPITAL_COMMUNITY)
Admission: RE | Admit: 2017-05-18 | Discharge: 2017-05-18 | Disposition: A | Discharge status: Home / Self Care | Payer: Self-pay | Source: Ambulatory Visit | Attending: Cardiology | Admitting: Cardiology

## 2017-05-19 ENCOUNTER — Encounter (HOSPITAL_COMMUNITY)
Admission: RE | Admit: 2017-05-19 | Discharge: 2017-05-19 | Disposition: A | Discharge status: Home / Self Care | Payer: Self-pay | Source: Ambulatory Visit | Attending: Cardiology | Admitting: Cardiology

## 2017-05-23 ENCOUNTER — Encounter (HOSPITAL_COMMUNITY)
Admission: RE | Admit: 2017-05-23 | Discharge: 2017-05-23 | Disposition: A | Discharge status: Home / Self Care | Payer: Self-pay | Source: Ambulatory Visit | Attending: Cardiology | Admitting: Cardiology

## 2017-05-25 ENCOUNTER — Encounter (HOSPITAL_COMMUNITY): Payer: Self-pay

## 2017-05-26 ENCOUNTER — Encounter (HOSPITAL_COMMUNITY)
Admission: RE | Admit: 2017-05-26 | Discharge: 2017-05-26 | Disposition: A | Discharge status: Home / Self Care | Payer: Self-pay | Source: Ambulatory Visit | Attending: Cardiology | Admitting: Cardiology

## 2017-05-30 ENCOUNTER — Encounter (HOSPITAL_COMMUNITY)
Admission: RE | Admit: 2017-05-30 | Discharge: 2017-05-30 | Disposition: A | Discharge status: Home / Self Care | Payer: Self-pay | Source: Ambulatory Visit | Attending: Cardiology | Admitting: Cardiology

## 2017-06-01 ENCOUNTER — Encounter (HOSPITAL_COMMUNITY)
Admission: RE | Admit: 2017-06-01 | Discharge: 2017-06-01 | Disposition: A | Discharge status: Home / Self Care | Payer: Self-pay | Source: Ambulatory Visit | Attending: Cardiology | Admitting: Cardiology

## 2017-06-01 DIAGNOSIS — E7800 Pure hypercholesterolemia, unspecified: Secondary | ICD-10-CM | POA: Diagnosis not present

## 2017-06-01 DIAGNOSIS — E11319 Type 2 diabetes mellitus with unspecified diabetic retinopathy without macular edema: Secondary | ICD-10-CM | POA: Diagnosis not present

## 2017-06-01 DIAGNOSIS — E1165 Type 2 diabetes mellitus with hyperglycemia: Secondary | ICD-10-CM | POA: Diagnosis not present

## 2017-06-01 DIAGNOSIS — N189 Chronic kidney disease, unspecified: Secondary | ICD-10-CM | POA: Diagnosis not present

## 2017-06-01 DIAGNOSIS — I10 Essential (primary) hypertension: Secondary | ICD-10-CM | POA: Diagnosis not present

## 2017-06-02 ENCOUNTER — Encounter (HOSPITAL_COMMUNITY)
Admission: RE | Admit: 2017-06-02 | Discharge: 2017-06-02 | Disposition: A | Discharge status: Home / Self Care | Payer: Self-pay | Source: Ambulatory Visit | Attending: Cardiology | Admitting: Cardiology

## 2017-06-06 ENCOUNTER — Encounter (HOSPITAL_COMMUNITY)
Admission: RE | Admit: 2017-06-06 | Discharge: 2017-06-06 | Disposition: A | Discharge status: Home / Self Care | Payer: Self-pay | Source: Ambulatory Visit | Attending: Cardiology | Admitting: Cardiology

## 2017-06-08 ENCOUNTER — Encounter (HOSPITAL_COMMUNITY)
Admission: RE | Admit: 2017-06-08 | Discharge: 2017-06-08 | Disposition: A | Discharge status: Home / Self Care | Payer: Self-pay | Source: Ambulatory Visit | Attending: Cardiology | Admitting: Cardiology

## 2017-06-09 ENCOUNTER — Encounter (HOSPITAL_COMMUNITY)
Admission: RE | Admit: 2017-06-09 | Discharge: 2017-06-09 | Disposition: A | Discharge status: Home / Self Care | Payer: Self-pay | Source: Ambulatory Visit | Attending: Cardiology | Admitting: Cardiology

## 2017-06-13 ENCOUNTER — Encounter (HOSPITAL_COMMUNITY)
Admission: RE | Admit: 2017-06-13 | Discharge: 2017-06-13 | Disposition: A | Discharge status: Home / Self Care | Payer: Self-pay | Source: Ambulatory Visit | Attending: Cardiology | Admitting: Cardiology

## 2017-06-15 ENCOUNTER — Encounter (HOSPITAL_COMMUNITY)
Admission: RE | Admit: 2017-06-15 | Discharge: 2017-06-15 | Disposition: A | Discharge status: Home / Self Care | Payer: Self-pay | Source: Ambulatory Visit | Attending: Cardiology | Admitting: Cardiology

## 2017-06-15 DIAGNOSIS — I2510 Atherosclerotic heart disease of native coronary artery without angina pectoris: Secondary | ICD-10-CM | POA: Insufficient documentation

## 2017-06-16 ENCOUNTER — Encounter (HOSPITAL_COMMUNITY)
Admission: RE | Admit: 2017-06-16 | Discharge: 2017-06-16 | Disposition: A | Discharge status: Home / Self Care | Payer: Medicare Other | Source: Ambulatory Visit | Attending: Cardiology | Admitting: Cardiology

## 2017-06-20 ENCOUNTER — Encounter (HOSPITAL_COMMUNITY)
Admission: RE | Admit: 2017-06-20 | Discharge: 2017-06-20 | Disposition: A | Discharge status: Home / Self Care | Payer: Self-pay | Source: Ambulatory Visit | Attending: Cardiology | Admitting: Cardiology

## 2017-06-22 ENCOUNTER — Encounter (HOSPITAL_COMMUNITY)
Admission: RE | Admit: 2017-06-22 | Discharge: 2017-06-22 | Disposition: A | Discharge status: Home / Self Care | Payer: Self-pay | Source: Ambulatory Visit | Attending: Cardiology | Admitting: Cardiology

## 2017-06-23 ENCOUNTER — Encounter (HOSPITAL_COMMUNITY)
Admission: RE | Admit: 2017-06-23 | Discharge: 2017-06-23 | Disposition: A | Discharge status: Home / Self Care | Payer: Self-pay | Source: Ambulatory Visit | Attending: Cardiology | Admitting: Cardiology

## 2017-06-27 ENCOUNTER — Encounter (HOSPITAL_COMMUNITY)
Admission: RE | Admit: 2017-06-27 | Discharge: 2017-06-27 | Disposition: A | Discharge status: Home / Self Care | Payer: Self-pay | Source: Ambulatory Visit | Attending: Cardiology | Admitting: Cardiology

## 2017-06-29 ENCOUNTER — Encounter (HOSPITAL_COMMUNITY): Payer: Self-pay

## 2017-06-30 ENCOUNTER — Encounter (HOSPITAL_COMMUNITY)
Admission: RE | Admit: 2017-06-30 | Discharge: 2017-06-30 | Disposition: A | Discharge status: Home / Self Care | Payer: Medicare Other | Source: Ambulatory Visit | Attending: Cardiology | Admitting: Cardiology

## 2017-07-04 ENCOUNTER — Encounter (HOSPITAL_COMMUNITY)
Admission: RE | Admit: 2017-07-04 | Discharge: 2017-07-04 | Disposition: A | Discharge status: Home / Self Care | Payer: Self-pay | Source: Ambulatory Visit | Attending: Cardiology | Admitting: Cardiology

## 2017-07-11 ENCOUNTER — Encounter (HOSPITAL_COMMUNITY)
Admission: RE | Admit: 2017-07-11 | Discharge: 2017-07-11 | Disposition: A | Discharge status: Home / Self Care | Payer: Self-pay | Source: Ambulatory Visit | Attending: Cardiology | Admitting: Cardiology

## 2017-07-13 ENCOUNTER — Encounter (HOSPITAL_COMMUNITY)
Admission: RE | Admit: 2017-07-13 | Discharge: 2017-07-13 | Disposition: A | Discharge status: Home / Self Care | Payer: Self-pay | Source: Ambulatory Visit | Attending: Cardiology | Admitting: Cardiology

## 2017-07-14 ENCOUNTER — Encounter (HOSPITAL_COMMUNITY)
Admission: RE | Admit: 2017-07-14 | Discharge: 2017-07-14 | Disposition: A | Discharge status: Home / Self Care | Payer: Self-pay | Source: Ambulatory Visit | Attending: Cardiology | Admitting: Cardiology

## 2017-07-18 ENCOUNTER — Encounter (HOSPITAL_COMMUNITY)
Admission: RE | Admit: 2017-07-18 | Discharge: 2017-07-18 | Disposition: A | Discharge status: Home / Self Care | Payer: Self-pay | Source: Ambulatory Visit | Attending: Cardiology | Admitting: Cardiology

## 2017-07-18 DIAGNOSIS — I2510 Atherosclerotic heart disease of native coronary artery without angina pectoris: Secondary | ICD-10-CM | POA: Insufficient documentation

## 2017-07-20 ENCOUNTER — Encounter (HOSPITAL_COMMUNITY)
Admission: RE | Admit: 2017-07-20 | Discharge: 2017-07-20 | Disposition: A | Discharge status: Home / Self Care | Payer: Self-pay | Source: Ambulatory Visit | Attending: Cardiology | Admitting: Cardiology

## 2017-07-21 ENCOUNTER — Encounter (HOSPITAL_COMMUNITY)
Admission: RE | Admit: 2017-07-21 | Discharge: 2017-07-21 | Disposition: A | Discharge status: Home / Self Care | Payer: Medicare Other | Source: Ambulatory Visit | Attending: Cardiology | Admitting: Cardiology

## 2017-07-25 ENCOUNTER — Encounter (HOSPITAL_COMMUNITY): Payer: Self-pay

## 2017-07-27 ENCOUNTER — Encounter (HOSPITAL_COMMUNITY)
Admission: RE | Admit: 2017-07-27 | Discharge: 2017-07-27 | Disposition: A | Discharge status: Home / Self Care | Payer: Self-pay | Source: Ambulatory Visit | Attending: Cardiology | Admitting: Cardiology

## 2017-07-28 ENCOUNTER — Encounter (HOSPITAL_COMMUNITY)
Admission: RE | Admit: 2017-07-28 | Discharge: 2017-07-28 | Disposition: A | Discharge status: Home / Self Care | Payer: Medicare Other | Source: Ambulatory Visit | Attending: Cardiology | Admitting: Cardiology

## 2017-08-01 ENCOUNTER — Encounter (HOSPITAL_COMMUNITY)
Admission: RE | Admit: 2017-08-01 | Discharge: 2017-08-01 | Disposition: A | Discharge status: Home / Self Care | Payer: Medicare Other | Source: Ambulatory Visit | Attending: Cardiology | Admitting: Cardiology

## 2017-08-03 ENCOUNTER — Encounter (HOSPITAL_COMMUNITY)
Admission: RE | Admit: 2017-08-03 | Discharge: 2017-08-03 | Disposition: A | Discharge status: Home / Self Care | Payer: Self-pay | Source: Ambulatory Visit | Attending: Cardiology | Admitting: Cardiology

## 2017-08-04 ENCOUNTER — Encounter (HOSPITAL_COMMUNITY)
Admission: RE | Admit: 2017-08-04 | Discharge: 2017-08-04 | Disposition: A | Discharge status: Home / Self Care | Payer: Self-pay | Source: Ambulatory Visit | Attending: Cardiology | Admitting: Cardiology

## 2017-08-10 ENCOUNTER — Encounter (HOSPITAL_COMMUNITY)
Admission: RE | Admit: 2017-08-10 | Discharge: 2017-08-10 | Disposition: A | Discharge status: Home / Self Care | Payer: Medicare Other | Source: Ambulatory Visit | Attending: Cardiology | Admitting: Cardiology

## 2017-08-11 ENCOUNTER — Encounter (HOSPITAL_COMMUNITY): Payer: Self-pay

## 2017-08-17 ENCOUNTER — Encounter (HOSPITAL_COMMUNITY)
Admission: RE | Admit: 2017-08-17 | Discharge: 2017-08-17 | Disposition: A | Discharge status: Home / Self Care | Payer: Self-pay | Source: Ambulatory Visit | Attending: Cardiology | Admitting: Cardiology

## 2017-08-17 DIAGNOSIS — I2510 Atherosclerotic heart disease of native coronary artery without angina pectoris: Secondary | ICD-10-CM | POA: Insufficient documentation

## 2017-08-18 ENCOUNTER — Encounter (HOSPITAL_COMMUNITY): Payer: Self-pay

## 2017-08-18 DIAGNOSIS — N183 Chronic kidney disease, stage 3 (moderate): Secondary | ICD-10-CM | POA: Diagnosis not present

## 2017-08-18 DIAGNOSIS — I2510 Atherosclerotic heart disease of native coronary artery without angina pectoris: Secondary | ICD-10-CM | POA: Diagnosis not present

## 2017-08-18 DIAGNOSIS — G4700 Insomnia, unspecified: Secondary | ICD-10-CM | POA: Diagnosis not present

## 2017-08-18 DIAGNOSIS — D500 Iron deficiency anemia secondary to blood loss (chronic): Secondary | ICD-10-CM | POA: Diagnosis not present

## 2017-08-18 DIAGNOSIS — E7800 Pure hypercholesterolemia, unspecified: Secondary | ICD-10-CM | POA: Diagnosis not present

## 2017-08-18 DIAGNOSIS — E1122 Type 2 diabetes mellitus with diabetic chronic kidney disease: Secondary | ICD-10-CM | POA: Diagnosis not present

## 2017-08-18 DIAGNOSIS — I10 Essential (primary) hypertension: Secondary | ICD-10-CM | POA: Diagnosis not present

## 2017-08-22 ENCOUNTER — Encounter (HOSPITAL_COMMUNITY)
Admission: RE | Admit: 2017-08-22 | Discharge: 2017-08-22 | Disposition: A | Discharge status: Home / Self Care | Payer: Self-pay | Source: Ambulatory Visit | Attending: Cardiology | Admitting: Cardiology

## 2017-08-24 ENCOUNTER — Encounter (HOSPITAL_COMMUNITY)
Admission: RE | Admit: 2017-08-24 | Discharge: 2017-08-24 | Disposition: A | Discharge status: Home / Self Care | Payer: Self-pay | Source: Ambulatory Visit | Attending: Cardiology | Admitting: Cardiology

## 2017-08-25 ENCOUNTER — Encounter (HOSPITAL_COMMUNITY)
Admission: RE | Admit: 2017-08-25 | Discharge: 2017-08-25 | Disposition: A | Discharge status: Home / Self Care | Payer: Self-pay | Source: Ambulatory Visit | Attending: Cardiology | Admitting: Cardiology

## 2017-08-29 ENCOUNTER — Encounter (HOSPITAL_COMMUNITY)
Admission: RE | Admit: 2017-08-29 | Discharge: 2017-08-29 | Disposition: A | Discharge status: Home / Self Care | Payer: Self-pay | Source: Ambulatory Visit | Attending: Cardiology | Admitting: Cardiology

## 2017-08-31 ENCOUNTER — Encounter (HOSPITAL_COMMUNITY)
Admission: RE | Admit: 2017-08-31 | Discharge: 2017-08-31 | Disposition: A | Discharge status: Home / Self Care | Payer: Self-pay | Source: Ambulatory Visit | Attending: Cardiology | Admitting: Cardiology

## 2017-09-01 ENCOUNTER — Encounter (HOSPITAL_COMMUNITY)
Admission: RE | Admit: 2017-09-01 | Discharge: 2017-09-01 | Disposition: A | Discharge status: Home / Self Care | Payer: Self-pay | Source: Ambulatory Visit | Attending: Cardiology | Admitting: Cardiology

## 2017-09-05 ENCOUNTER — Encounter (HOSPITAL_COMMUNITY)
Admission: RE | Admit: 2017-09-05 | Discharge: 2017-09-05 | Disposition: A | Discharge status: Home / Self Care | Payer: Self-pay | Source: Ambulatory Visit | Attending: Cardiology | Admitting: Cardiology

## 2017-09-06 ENCOUNTER — Encounter (HOSPITAL_COMMUNITY)
Admission: RE | Admit: 2017-09-06 | Discharge: 2017-09-06 | Disposition: A | Discharge status: Home / Self Care | Payer: Self-pay | Source: Ambulatory Visit | Attending: Cardiology | Admitting: Cardiology

## 2017-09-07 ENCOUNTER — Encounter (HOSPITAL_COMMUNITY)
Admission: RE | Admit: 2017-09-07 | Discharge: 2017-09-07 | Disposition: A | Discharge status: Home / Self Care | Payer: Self-pay | Source: Ambulatory Visit | Attending: Cardiology | Admitting: Cardiology

## 2017-09-08 ENCOUNTER — Encounter (HOSPITAL_COMMUNITY)
Admission: RE | Admit: 2017-09-08 | Discharge: 2017-09-08 | Disposition: A | Discharge status: Home / Self Care | Payer: Self-pay | Source: Ambulatory Visit | Attending: Cardiology | Admitting: Cardiology

## 2017-09-12 ENCOUNTER — Encounter (HOSPITAL_COMMUNITY)
Admission: RE | Admit: 2017-09-12 | Discharge: 2017-09-12 | Disposition: A | Discharge status: Home / Self Care | Payer: Self-pay | Source: Ambulatory Visit | Attending: Cardiology | Admitting: Cardiology

## 2017-09-14 ENCOUNTER — Encounter (HOSPITAL_COMMUNITY)
Admission: RE | Admit: 2017-09-14 | Discharge: 2017-09-14 | Disposition: A | Discharge status: Home / Self Care | Payer: Medicare Other | Source: Ambulatory Visit | Attending: Cardiology | Admitting: Cardiology

## 2017-09-15 ENCOUNTER — Encounter (HOSPITAL_COMMUNITY)
Admission: RE | Admit: 2017-09-15 | Discharge: 2017-09-15 | Disposition: A | Discharge status: Home / Self Care | Payer: Self-pay | Source: Ambulatory Visit | Attending: Cardiology | Admitting: Cardiology

## 2017-09-15 DIAGNOSIS — I2510 Atherosclerotic heart disease of native coronary artery without angina pectoris: Secondary | ICD-10-CM | POA: Insufficient documentation

## 2017-09-19 ENCOUNTER — Encounter (HOSPITAL_COMMUNITY)
Admission: RE | Admit: 2017-09-19 | Discharge: 2017-09-19 | Disposition: A | Discharge status: Home / Self Care | Payer: Self-pay | Source: Ambulatory Visit | Attending: Cardiology | Admitting: Cardiology

## 2017-09-21 ENCOUNTER — Encounter (HOSPITAL_COMMUNITY)
Admission: RE | Admit: 2017-09-21 | Discharge: 2017-09-21 | Disposition: A | Discharge status: Home / Self Care | Payer: Self-pay | Source: Ambulatory Visit | Attending: Cardiology | Admitting: Cardiology

## 2017-09-22 ENCOUNTER — Encounter (HOSPITAL_COMMUNITY)
Admission: RE | Admit: 2017-09-22 | Discharge: 2017-09-22 | Disposition: A | Discharge status: Home / Self Care | Payer: Self-pay | Source: Ambulatory Visit | Attending: Cardiology | Admitting: Cardiology

## 2017-09-26 ENCOUNTER — Encounter (HOSPITAL_COMMUNITY)
Admission: RE | Admit: 2017-09-26 | Discharge: 2017-09-26 | Disposition: A | Discharge status: Home / Self Care | Payer: Self-pay | Source: Ambulatory Visit | Attending: Cardiology | Admitting: Cardiology

## 2017-09-28 ENCOUNTER — Encounter (HOSPITAL_COMMUNITY)
Admission: RE | Admit: 2017-09-28 | Discharge: 2017-09-28 | Disposition: A | Discharge status: Home / Self Care | Payer: Self-pay | Source: Ambulatory Visit | Attending: Cardiology | Admitting: Cardiology

## 2017-10-03 ENCOUNTER — Encounter (HOSPITAL_COMMUNITY)
Admission: RE | Admit: 2017-10-03 | Discharge: 2017-10-03 | Disposition: A | Discharge status: Home / Self Care | Payer: Self-pay | Source: Ambulatory Visit | Attending: Cardiology | Admitting: Cardiology

## 2017-10-05 ENCOUNTER — Encounter (HOSPITAL_COMMUNITY)
Admission: RE | Admit: 2017-10-05 | Discharge: 2017-10-05 | Disposition: A | Discharge status: Home / Self Care | Payer: Self-pay | Source: Ambulatory Visit | Attending: Cardiology | Admitting: Cardiology

## 2017-10-06 ENCOUNTER — Encounter (HOSPITAL_COMMUNITY)
Admission: RE | Admit: 2017-10-06 | Discharge: 2017-10-06 | Disposition: A | Discharge status: Home / Self Care | Payer: Self-pay | Source: Ambulatory Visit | Attending: Cardiology | Admitting: Cardiology

## 2017-10-10 ENCOUNTER — Encounter (HOSPITAL_COMMUNITY)
Admission: RE | Admit: 2017-10-10 | Discharge: 2017-10-10 | Disposition: A | Discharge status: Home / Self Care | Payer: Self-pay | Source: Ambulatory Visit | Attending: Cardiology | Admitting: Cardiology

## 2017-10-11 DIAGNOSIS — E1165 Type 2 diabetes mellitus with hyperglycemia: Secondary | ICD-10-CM | POA: Diagnosis not present

## 2017-10-11 DIAGNOSIS — I10 Essential (primary) hypertension: Secondary | ICD-10-CM | POA: Diagnosis not present

## 2017-10-12 ENCOUNTER — Encounter (HOSPITAL_COMMUNITY)
Admission: RE | Admit: 2017-10-12 | Discharge: 2017-10-12 | Disposition: A | Discharge status: Home / Self Care | Payer: Self-pay | Source: Ambulatory Visit | Attending: Cardiology | Admitting: Cardiology

## 2017-10-13 ENCOUNTER — Encounter (HOSPITAL_COMMUNITY)
Admission: RE | Admit: 2017-10-13 | Discharge: 2017-10-13 | Disposition: A | Discharge status: Home / Self Care | Payer: Self-pay | Source: Ambulatory Visit | Attending: Cardiology | Admitting: Cardiology

## 2017-10-13 DIAGNOSIS — I2510 Atherosclerotic heart disease of native coronary artery without angina pectoris: Secondary | ICD-10-CM | POA: Insufficient documentation

## 2017-10-17 ENCOUNTER — Encounter (HOSPITAL_COMMUNITY)
Admission: RE | Admit: 2017-10-17 | Discharge: 2017-10-17 | Disposition: A | Discharge status: Home / Self Care | Payer: Self-pay | Source: Ambulatory Visit | Attending: Cardiology | Admitting: Cardiology

## 2017-10-19 ENCOUNTER — Encounter (HOSPITAL_COMMUNITY)
Admission: RE | Admit: 2017-10-19 | Discharge: 2017-10-19 | Disposition: A | Discharge status: Home / Self Care | Payer: Self-pay | Source: Ambulatory Visit | Attending: Cardiology | Admitting: Cardiology

## 2017-10-20 ENCOUNTER — Encounter (HOSPITAL_COMMUNITY)
Admission: RE | Admit: 2017-10-20 | Discharge: 2017-10-20 | Disposition: A | Discharge status: Home / Self Care | Payer: Self-pay | Source: Ambulatory Visit | Attending: Cardiology | Admitting: Cardiology

## 2017-10-24 ENCOUNTER — Encounter (HOSPITAL_COMMUNITY)
Admission: RE | Admit: 2017-10-24 | Discharge: 2017-10-24 | Disposition: A | Discharge status: Home / Self Care | Payer: Self-pay | Source: Ambulatory Visit | Attending: Cardiology | Admitting: Cardiology

## 2017-11-07 ENCOUNTER — Encounter (HOSPITAL_COMMUNITY)
Admission: RE | Admit: 2017-11-07 | Discharge: 2017-11-07 | Disposition: A | Discharge status: Home / Self Care | Payer: Self-pay | Source: Ambulatory Visit | Attending: Cardiology | Admitting: Cardiology

## 2017-11-08 ENCOUNTER — Encounter: Payer: Medicare Other | Attending: Family Medicine | Admitting: *Deleted

## 2017-11-08 DIAGNOSIS — Z713 Dietary counseling and surveillance: Secondary | ICD-10-CM | POA: Diagnosis not present

## 2017-11-08 DIAGNOSIS — E119 Type 2 diabetes mellitus without complications: Secondary | ICD-10-CM | POA: Diagnosis not present

## 2017-11-08 NOTE — Progress Notes (Signed)
Diabetes Self-Management Education  Visit Type: First/Initial  Appt. Start Time: 0800 Appt. End Time: 0930  11/08/2017  [name], identified by name and date of birth, is a 67 y.o. male with a diagnosis of Diabetes: Type 2. 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 His wife prepares his meals. He lives on 3 acres of land and enjoys working outside on his days off, sometimes for 8-10 hours. He states he has low BG about once a week, especially when more active. He states he takes his fast acting after meals occasionally, if he forgets before the meal.  ASSESSMENT  Height 5\' 11"  (1.803 m), weight 179 lb 1.6 oz (81.2 kg). Body mass index is 24.98 kg/m.  Diabetes Self-Management Education - 11/08/17 0819      Visit Information   Visit Type  First/Initial      Initial Visit   Diabetes Type  Type 2    Are you currently following a meal plan?  No    Are you taking your medications as prescribed?  Yes    Date Diagnosed  years ago      Health Coping   How would you rate your overall health?  Good      Psychosocial Assessment   Patient Belief/Attitude about Diabetes  -- patient did not answer    Self-care barriers  None    Self-management support  Family    Other persons present  Patient    Patient Concerns  Nutrition/Meal planning;Medication;Monitoring;Glycemic Control    Special Needs  None    Learning Readiness  Change in progress    How often do you need to have someone help you when you read instructions, pamphlets, or other written materials from your doctor or pharmacy?  1 - Never    What is the last grade level you completed in school?  12      Pre-Education Assessment   Patient understands the diabetes disease and treatment process.  Demonstrates understanding / competency    Patient understands incorporating nutritional management into lifestyle.  Needs Review    Patient undertands incorporating physical activity into  lifestyle.  Needs Review    Patient understands using medications safely.  Needs Review    Patient understands monitoring blood glucose, interpreting and using results  Demonstrates understanding / competency    Patient understands prevention, detection, and treatment of acute complications.  Needs Review    Patient understands prevention, detection, and treatment of chronic complications.  Demonstrates understanding / competency    Patient understands how to develop strategies to address psychosocial issues.  Demonstrates understanding / competency    Patient understands how to develop strategies to promote health/change behavior.  Demonstrates understanding / competency      Complications   Last HgB A1C per patient/outside source  9 %    How often do you check your blood sugar?  3-4 times/day    Number of hypoglycemic episodes per month  4    Can you tell when your blood sugar is low?  Yes    What do you do if your blood sugar is low?  eat lots of food    Have you had a dilated eye exam in the past 12 months?  Yes    Have you had a dental exam in the past 12 months?  Yes    Are you checking your feet?  Yes    How many days per week  are you checking your feet?  7      Dietary Intake   Breakfast  flavored oatmeal with walnuts x 1 pkt    Lunch  8 PM before work: meat, starch, vegetables and bread    Dinner  3 AM: meal at work - sandwich (grilled chicken) OR leftovers from home    Beverage(s)  diet soda, water occasionally, coffee      Exercise   Exercise Type  Light (walking / raking leaves) rehab now, previously walked at home and lifted weights.    How many days per week to you exercise?  3    How many minutes per day do you exercise?  30    Total minutes per week of exercise  90      Patient Education   Previous Diabetes Education  Yes (please comment) a few years ago    Disease state   Factors that contribute to the development of diabetes    Nutrition management   Carbohydrate  counting;Role of diet in the treatment of diabetes and the relationship between the three main macronutrients and blood glucose level;Meal timing in regards to the patients' current diabetes medication.    Physical activity and exercise   Role of exercise on diabetes management, blood pressure control and cardiac health.;Identified with patient nutritional and/or medication changes necessary with exercise.    Medications  Reviewed patients medication for diabetes, action, purpose, timing of dose and side effects.      Individualized Goals (developed by patient)   Nutrition  General guidelines for healthy choices and portions discussed    Physical Activity  Exercise 3-5 times per week    Medications  take my medication as prescribed IF correcting a high BG in less than 2 hours from last dose, take 1/2 the amount    Monitoring   test blood glucose pre and post meals as discussed [employer] Reader with you when working outside      Independence   Patient understands the diabetes disease and treatment process.  Demonstrates understanding / competency    Patient understands incorporating nutritional management into lifestyle.  Demonstrates understanding / competency    Patient undertands incorporating physical activity into lifestyle.  Demonstrates understanding / competency    Patient understands using medications safely.  Demonstrates understanding / competency    Patient understands monitoring blood glucose, interpreting and using results  Demonstrates understanding / competency    Patient understands prevention, detection, and treatment of acute complications.  Demonstrates understanding / competency    Patient understands prevention, detection, and treatment of chronic complications.  Demonstrates understanding / competency    Patient understands how to develop strategies to address psychosocial issues.  Demonstrates understanding / competency    Patient understands how to develop  strategies to promote health/change behavior.  Demonstrates understanding / competency      Outcomes   Expected Outcomes  Demonstrated interest in learning. Expect positive outcomes    Future DMSE  PRN    Program Status  Completed       Individualized Plan for Diabetes Self-Management Training:   Learning Objective:  Patient will have a greater understanding of diabetes self-management. Patient education plan is to attend individual and/or group sessions per assessed needs and concerns.   Plan:   Patient Instructions  Plan:   Aim for 3 Carb Choices per meal (45 grams) +/- 1 either way   Aim for 0-2 Carbs per snack if hungry   Include protein in moderation with  your meals and snacks  Consider reading food labels for Total Carbohydrate  of foods  Continue with your activity level daily as tolerated  Consider using a passport pocket holder to carry your [place] Reader around your neck especially when working outside so you can have it with you and not lose it. Also consider checking BG every hour or so when physically active to help prevent low BG.   Continue taking medication as directed by MD  Expected Outcomes:  Demonstrated interest in learning. Expect positive outcomes  Education material provided: A1C conversion sheet, Meal plan card and Carbohydrate counting sheet, Insulin Action handout  If problems or questions, patient to contact team via:  Phone  Future DSME appointment: PRN

## 2017-11-08 NOTE — Patient Instructions (Signed)
Plan:   Aim for 3 Carb Choices per meal (45 grams) +/- 1 either way   Aim for 0-2 Carbs per snack if hungry   Include protein in moderation with your meals and snacks  Consider reading food labels for Total Carbohydrate  of foods  Continue with your activity level daily as tolerated  Consider using a passport pocket holder to carry your [place] Reader around your neck especially when working outside so you can have it with you and not lose it. Also consider checking BG every hour or so when physically active to help prevent low BG.   Continue taking medication as directed by MD

## 2017-11-09 ENCOUNTER — Encounter (HOSPITAL_COMMUNITY)
Admission: RE | Admit: 2017-11-09 | Discharge: 2017-11-09 | Disposition: A | Discharge status: Home / Self Care | Payer: Self-pay | Source: Ambulatory Visit | Attending: Cardiology | Admitting: Cardiology

## 2017-11-10 ENCOUNTER — Encounter (HOSPITAL_COMMUNITY)
Admission: RE | Admit: 2017-11-10 | Discharge: 2017-11-10 | Disposition: A | Discharge status: Home / Self Care | Payer: Self-pay | Source: Ambulatory Visit | Attending: Cardiology | Admitting: Cardiology

## 2017-11-14 ENCOUNTER — Encounter (HOSPITAL_COMMUNITY)
Admission: RE | Admit: 2017-11-14 | Discharge: 2017-11-14 | Disposition: A | Discharge status: Home / Self Care | Payer: Self-pay | Source: Ambulatory Visit | Attending: Cardiology | Admitting: Cardiology

## 2017-11-14 DIAGNOSIS — I2510 Atherosclerotic heart disease of native coronary artery without angina pectoris: Secondary | ICD-10-CM | POA: Insufficient documentation

## 2017-11-16 ENCOUNTER — Encounter (HOSPITAL_COMMUNITY)
Admission: RE | Admit: 2017-11-16 | Discharge: 2017-11-16 | Disposition: A | Discharge status: Home / Self Care | Payer: Self-pay | Source: Ambulatory Visit | Attending: Cardiology | Admitting: Cardiology

## 2017-11-17 ENCOUNTER — Encounter (HOSPITAL_COMMUNITY)
Admission: RE | Admit: 2017-11-17 | Discharge: 2017-11-17 | Disposition: A | Discharge status: Home / Self Care | Payer: Self-pay | Source: Ambulatory Visit | Attending: Cardiology | Admitting: Cardiology

## 2017-11-21 ENCOUNTER — Encounter (HOSPITAL_COMMUNITY)
Admission: RE | Admit: 2017-11-21 | Discharge: 2017-11-21 | Disposition: A | Discharge status: Home / Self Care | Payer: Self-pay | Source: Ambulatory Visit | Attending: Cardiology | Admitting: Cardiology

## 2017-11-23 ENCOUNTER — Encounter (HOSPITAL_COMMUNITY)
Admission: RE | Admit: 2017-11-23 | Discharge: 2017-11-23 | Disposition: A | Discharge status: Home / Self Care | Payer: Self-pay | Source: Ambulatory Visit | Attending: Cardiology | Admitting: Cardiology

## 2017-11-24 ENCOUNTER — Encounter (HOSPITAL_COMMUNITY)
Admission: RE | Admit: 2017-11-24 | Discharge: 2017-11-24 | Disposition: A | Discharge status: Home / Self Care | Payer: Self-pay | Source: Ambulatory Visit | Attending: Cardiology | Admitting: Cardiology

## 2017-11-28 ENCOUNTER — Encounter (HOSPITAL_COMMUNITY)
Admission: RE | Admit: 2017-11-28 | Discharge: 2017-11-28 | Disposition: A | Discharge status: Home / Self Care | Payer: Self-pay | Source: Ambulatory Visit | Attending: Cardiology | Admitting: Cardiology

## 2017-11-30 ENCOUNTER — Encounter (HOSPITAL_COMMUNITY)
Admission: RE | Admit: 2017-11-30 | Discharge: 2017-11-30 | Disposition: A | Discharge status: Home / Self Care | Payer: Self-pay | Source: Ambulatory Visit | Attending: Cardiology | Admitting: Cardiology

## 2017-12-01 ENCOUNTER — Encounter (HOSPITAL_COMMUNITY)
Admission: RE | Admit: 2017-12-01 | Discharge: 2017-12-01 | Disposition: A | Discharge status: Home / Self Care | Payer: Self-pay | Source: Ambulatory Visit | Attending: Cardiology | Admitting: Cardiology

## 2017-12-05 ENCOUNTER — Encounter (HOSPITAL_COMMUNITY): Payer: Self-pay

## 2017-12-07 ENCOUNTER — Encounter (HOSPITAL_COMMUNITY): Payer: Self-pay

## 2017-12-08 ENCOUNTER — Encounter (HOSPITAL_COMMUNITY): Payer: Self-pay

## 2017-12-12 ENCOUNTER — Encounter (HOSPITAL_COMMUNITY)
Admission: RE | Admit: 2017-12-12 | Discharge: 2017-12-12 | Disposition: A | Discharge status: Home / Self Care | Payer: Self-pay | Source: Ambulatory Visit | Attending: Cardiology | Admitting: Cardiology

## 2017-12-14 ENCOUNTER — Encounter (HOSPITAL_COMMUNITY)
Admission: RE | Admit: 2017-12-14 | Discharge: 2017-12-14 | Disposition: A | Discharge status: Home / Self Care | Payer: Self-pay | Source: Ambulatory Visit | Attending: Cardiology | Admitting: Cardiology

## 2017-12-14 DIAGNOSIS — I2510 Atherosclerotic heart disease of native coronary artery without angina pectoris: Secondary | ICD-10-CM | POA: Insufficient documentation

## 2017-12-15 ENCOUNTER — Encounter (HOSPITAL_COMMUNITY)
Admission: RE | Admit: 2017-12-15 | Discharge: 2017-12-15 | Disposition: A | Discharge status: Home / Self Care | Payer: Self-pay | Source: Ambulatory Visit | Attending: Cardiology | Admitting: Cardiology

## 2017-12-19 ENCOUNTER — Encounter (HOSPITAL_COMMUNITY)
Admission: RE | Admit: 2017-12-19 | Discharge: 2017-12-19 | Disposition: A | Discharge status: Home / Self Care | Payer: Self-pay | Source: Ambulatory Visit | Attending: Cardiology | Admitting: Cardiology

## 2017-12-21 ENCOUNTER — Encounter (HOSPITAL_COMMUNITY)
Admission: RE | Admit: 2017-12-21 | Discharge: 2017-12-21 | Disposition: A | Discharge status: Home / Self Care | Payer: Self-pay | Source: Ambulatory Visit | Attending: Cardiology | Admitting: Cardiology

## 2017-12-22 ENCOUNTER — Encounter (HOSPITAL_COMMUNITY)
Admission: RE | Admit: 2017-12-22 | Discharge: 2017-12-22 | Disposition: A | Discharge status: Home / Self Care | Payer: Self-pay | Source: Ambulatory Visit | Attending: Cardiology | Admitting: Cardiology

## 2017-12-26 ENCOUNTER — Encounter (HOSPITAL_COMMUNITY)
Admission: RE | Admit: 2017-12-26 | Discharge: 2017-12-26 | Disposition: A | Discharge status: Home / Self Care | Payer: Self-pay | Source: Ambulatory Visit | Attending: Cardiology | Admitting: Cardiology

## 2017-12-28 ENCOUNTER — Encounter (HOSPITAL_COMMUNITY)
Admission: RE | Admit: 2017-12-28 | Discharge: 2017-12-28 | Disposition: A | Discharge status: Home / Self Care | Payer: Medicare Other | Source: Ambulatory Visit | Attending: Cardiology | Admitting: Cardiology

## 2017-12-29 ENCOUNTER — Encounter (HOSPITAL_COMMUNITY)
Admission: RE | Admit: 2017-12-29 | Discharge: 2017-12-29 | Disposition: A | Discharge status: Home / Self Care | Payer: Medicare Other | Source: Ambulatory Visit | Attending: Cardiology | Admitting: Cardiology

## 2018-01-02 ENCOUNTER — Encounter (HOSPITAL_COMMUNITY)
Admission: RE | Admit: 2018-01-02 | Discharge: 2018-01-02 | Disposition: A | Discharge status: Home / Self Care | Payer: Self-pay | Source: Ambulatory Visit | Attending: Cardiology | Admitting: Cardiology

## 2018-01-04 ENCOUNTER — Encounter (HOSPITAL_COMMUNITY)
Admission: RE | Admit: 2018-01-04 | Discharge: 2018-01-04 | Disposition: A | Discharge status: Home / Self Care | Payer: Self-pay | Source: Ambulatory Visit | Attending: Cardiology | Admitting: Cardiology

## 2018-01-05 ENCOUNTER — Encounter (HOSPITAL_COMMUNITY)
Admission: RE | Admit: 2018-01-05 | Discharge: 2018-01-05 | Disposition: A | Discharge status: Home / Self Care | Payer: Self-pay | Source: Ambulatory Visit | Attending: Cardiology | Admitting: Cardiology

## 2018-01-09 ENCOUNTER — Encounter (HOSPITAL_COMMUNITY)
Admission: RE | Admit: 2018-01-09 | Discharge: 2018-01-09 | Disposition: A | Discharge status: Home / Self Care | Payer: Self-pay | Source: Ambulatory Visit | Attending: Cardiology | Admitting: Cardiology

## 2018-01-11 ENCOUNTER — Encounter (HOSPITAL_COMMUNITY)
Admission: RE | Admit: 2018-01-11 | Discharge: 2018-01-11 | Disposition: A | Discharge status: Home / Self Care | Payer: Medicare Other | Source: Ambulatory Visit | Attending: Cardiology | Admitting: Cardiology

## 2018-01-12 ENCOUNTER — Encounter (HOSPITAL_COMMUNITY)
Admission: RE | Admit: 2018-01-12 | Discharge: 2018-01-12 | Disposition: A | Discharge status: Home / Self Care | Payer: Medicare Other | Source: Ambulatory Visit | Attending: Cardiology | Admitting: Cardiology

## 2018-01-16 ENCOUNTER — Encounter (HOSPITAL_COMMUNITY)
Admission: RE | Admit: 2018-01-16 | Discharge: 2018-01-16 | Disposition: A | Discharge status: Home / Self Care | Payer: Self-pay | Source: Ambulatory Visit | Attending: Cardiology | Admitting: Cardiology

## 2018-01-16 DIAGNOSIS — I2510 Atherosclerotic heart disease of native coronary artery without angina pectoris: Secondary | ICD-10-CM | POA: Insufficient documentation

## 2018-01-18 ENCOUNTER — Encounter (HOSPITAL_COMMUNITY): Payer: Self-pay

## 2018-01-19 ENCOUNTER — Encounter (HOSPITAL_COMMUNITY): Payer: Self-pay

## 2018-01-23 ENCOUNTER — Encounter (HOSPITAL_COMMUNITY)
Admission: RE | Admit: 2018-01-23 | Discharge: 2018-01-23 | Disposition: A | Discharge status: Home / Self Care | Payer: Self-pay | Source: Ambulatory Visit | Attending: Cardiology | Admitting: Cardiology

## 2018-01-25 ENCOUNTER — Encounter (HOSPITAL_COMMUNITY): Payer: Self-pay

## 2018-01-26 ENCOUNTER — Encounter (HOSPITAL_COMMUNITY): Payer: Self-pay

## 2018-01-30 ENCOUNTER — Ambulatory Visit: Payer: Medicare Other | Admitting: Cardiology

## 2018-01-30 ENCOUNTER — Encounter (HOSPITAL_COMMUNITY): Payer: Self-pay

## 2018-01-31 DIAGNOSIS — R5383 Other fatigue: Secondary | ICD-10-CM | POA: Diagnosis not present

## 2018-01-31 DIAGNOSIS — Z0001 Encounter for general adult medical examination with abnormal findings: Secondary | ICD-10-CM | POA: Diagnosis not present

## 2018-01-31 DIAGNOSIS — M545 Low back pain: Secondary | ICD-10-CM | POA: Diagnosis not present

## 2018-01-31 DIAGNOSIS — Z125 Encounter for screening for malignant neoplasm of prostate: Secondary | ICD-10-CM | POA: Diagnosis not present

## 2018-01-31 DIAGNOSIS — D500 Iron deficiency anemia secondary to blood loss (chronic): Secondary | ICD-10-CM | POA: Diagnosis not present

## 2018-01-31 DIAGNOSIS — G4700 Insomnia, unspecified: Secondary | ICD-10-CM | POA: Diagnosis not present

## 2018-01-31 DIAGNOSIS — I10 Essential (primary) hypertension: Secondary | ICD-10-CM | POA: Diagnosis not present

## 2018-01-31 DIAGNOSIS — N183 Chronic kidney disease, stage 3 (moderate): Secondary | ICD-10-CM | POA: Diagnosis not present

## 2018-01-31 DIAGNOSIS — Z136 Encounter for screening for cardiovascular disorders: Secondary | ICD-10-CM | POA: Diagnosis not present

## 2018-01-31 DIAGNOSIS — E1122 Type 2 diabetes mellitus with diabetic chronic kidney disease: Secondary | ICD-10-CM | POA: Diagnosis not present

## 2018-02-01 ENCOUNTER — Encounter (HOSPITAL_COMMUNITY): Payer: Self-pay

## 2018-02-02 ENCOUNTER — Encounter (HOSPITAL_COMMUNITY): Payer: Self-pay

## 2018-02-06 ENCOUNTER — Encounter (HOSPITAL_COMMUNITY)
Admission: RE | Admit: 2018-02-06 | Discharge: 2018-02-06 | Disposition: A | Discharge status: Home / Self Care | Payer: Medicare Other | Source: Ambulatory Visit | Attending: Cardiology | Admitting: Cardiology

## 2018-02-08 ENCOUNTER — Encounter (HOSPITAL_COMMUNITY)
Admission: RE | Admit: 2018-02-08 | Discharge: 2018-02-08 | Disposition: A | Discharge status: Home / Self Care | Payer: Self-pay | Source: Ambulatory Visit | Attending: Cardiology | Admitting: Cardiology

## 2018-02-09 ENCOUNTER — Encounter (HOSPITAL_COMMUNITY)
Admission: RE | Admit: 2018-02-09 | Discharge: 2018-02-09 | Disposition: A | Discharge status: Home / Self Care | Payer: Self-pay | Source: Ambulatory Visit | Attending: Cardiology | Admitting: Cardiology

## 2018-02-13 ENCOUNTER — Encounter (HOSPITAL_COMMUNITY)
Admission: RE | Admit: 2018-02-13 | Discharge: 2018-02-13 | Disposition: A | Discharge status: Home / Self Care | Payer: Medicare Other | Source: Ambulatory Visit | Attending: Cardiology | Admitting: Cardiology

## 2018-02-13 DIAGNOSIS — I2510 Atherosclerotic heart disease of native coronary artery without angina pectoris: Secondary | ICD-10-CM | POA: Insufficient documentation

## 2018-02-14 DIAGNOSIS — E1165 Type 2 diabetes mellitus with hyperglycemia: Secondary | ICD-10-CM | POA: Diagnosis not present

## 2018-02-14 DIAGNOSIS — E039 Hypothyroidism, unspecified: Secondary | ICD-10-CM | POA: Diagnosis not present

## 2018-02-14 DIAGNOSIS — I10 Essential (primary) hypertension: Secondary | ICD-10-CM | POA: Diagnosis not present

## 2018-02-14 DIAGNOSIS — E11319 Type 2 diabetes mellitus with unspecified diabetic retinopathy without macular edema: Secondary | ICD-10-CM | POA: Diagnosis not present

## 2018-02-14 DIAGNOSIS — N189 Chronic kidney disease, unspecified: Secondary | ICD-10-CM | POA: Diagnosis not present

## 2018-02-14 DIAGNOSIS — E7800 Pure hypercholesterolemia, unspecified: Secondary | ICD-10-CM | POA: Diagnosis not present

## 2018-02-16 ENCOUNTER — Encounter (HOSPITAL_COMMUNITY)
Admission: RE | Admit: 2018-02-16 | Discharge: 2018-02-16 | Disposition: A | Discharge status: Home / Self Care | Payer: Self-pay | Source: Ambulatory Visit | Attending: Cardiology | Admitting: Cardiology

## 2018-02-20 ENCOUNTER — Encounter (HOSPITAL_COMMUNITY)
Admission: RE | Admit: 2018-02-20 | Discharge: 2018-02-20 | Disposition: A | Discharge status: Home / Self Care | Payer: Self-pay | Source: Ambulatory Visit | Attending: Cardiology | Admitting: Cardiology

## 2018-02-21 DIAGNOSIS — Z86010 Personal history of colonic polyps: Secondary | ICD-10-CM | POA: Diagnosis not present

## 2018-02-21 DIAGNOSIS — I10 Essential (primary) hypertension: Secondary | ICD-10-CM | POA: Diagnosis not present

## 2018-02-21 DIAGNOSIS — Z794 Long term (current) use of insulin: Secondary | ICD-10-CM | POA: Diagnosis not present

## 2018-02-21 DIAGNOSIS — D500 Iron deficiency anemia secondary to blood loss (chronic): Secondary | ICD-10-CM | POA: Diagnosis not present

## 2018-02-21 DIAGNOSIS — I2510 Atherosclerotic heart disease of native coronary artery without angina pectoris: Secondary | ICD-10-CM | POA: Diagnosis not present

## 2018-02-21 DIAGNOSIS — N183 Chronic kidney disease, stage 3 (moderate): Secondary | ICD-10-CM | POA: Diagnosis not present

## 2018-02-21 DIAGNOSIS — E1122 Type 2 diabetes mellitus with diabetic chronic kidney disease: Secondary | ICD-10-CM | POA: Diagnosis not present

## 2018-02-22 ENCOUNTER — Encounter (HOSPITAL_COMMUNITY)
Admission: RE | Admit: 2018-02-22 | Discharge: 2018-02-22 | Disposition: A | Discharge status: Home / Self Care | Payer: Medicare Other | Source: Ambulatory Visit | Attending: Cardiology | Admitting: Cardiology

## 2018-02-23 ENCOUNTER — Encounter (HOSPITAL_COMMUNITY)
Admission: RE | Admit: 2018-02-23 | Discharge: 2018-02-23 | Disposition: A | Discharge status: Home / Self Care | Payer: Self-pay | Source: Ambulatory Visit | Attending: Cardiology | Admitting: Cardiology

## 2018-02-27 ENCOUNTER — Encounter (HOSPITAL_COMMUNITY)
Admission: RE | Admit: 2018-02-27 | Discharge: 2018-02-27 | Disposition: A | Discharge status: Home / Self Care | Payer: Self-pay | Source: Ambulatory Visit | Attending: Cardiology | Admitting: Cardiology

## 2018-03-01 ENCOUNTER — Encounter (HOSPITAL_COMMUNITY)
Admission: RE | Admit: 2018-03-01 | Discharge: 2018-03-01 | Disposition: A | Discharge status: Home / Self Care | Payer: Self-pay | Source: Ambulatory Visit | Attending: Cardiology | Admitting: Cardiology

## 2018-03-02 ENCOUNTER — Encounter (HOSPITAL_COMMUNITY)
Admission: RE | Admit: 2018-03-02 | Discharge: 2018-03-02 | Disposition: A | Discharge status: Home / Self Care | Payer: Self-pay | Source: Ambulatory Visit | Attending: Cardiology | Admitting: Cardiology

## 2018-03-06 ENCOUNTER — Encounter (HOSPITAL_COMMUNITY)
Admission: RE | Admit: 2018-03-06 | Discharge: 2018-03-06 | Disposition: A | Discharge status: Home / Self Care | Payer: Self-pay | Source: Ambulatory Visit | Attending: Cardiology | Admitting: Cardiology

## 2018-03-08 ENCOUNTER — Encounter (HOSPITAL_COMMUNITY): Payer: Self-pay

## 2018-03-09 ENCOUNTER — Encounter (HOSPITAL_COMMUNITY)
Admission: RE | Admit: 2018-03-09 | Discharge: 2018-03-09 | Disposition: A | Discharge status: Home / Self Care | Payer: Self-pay | Source: Ambulatory Visit | Attending: Cardiology | Admitting: Cardiology

## 2018-03-13 ENCOUNTER — Encounter (HOSPITAL_COMMUNITY)
Admission: RE | Admit: 2018-03-13 | Discharge: 2018-03-13 | Disposition: A | Discharge status: Home / Self Care | Payer: Self-pay | Source: Ambulatory Visit | Attending: Cardiology | Admitting: Cardiology

## 2018-03-15 ENCOUNTER — Encounter (HOSPITAL_COMMUNITY): Payer: Self-pay

## 2018-03-15 DIAGNOSIS — I2510 Atherosclerotic heart disease of native coronary artery without angina pectoris: Secondary | ICD-10-CM | POA: Insufficient documentation

## 2018-03-16 ENCOUNTER — Encounter (HOSPITAL_COMMUNITY)
Admission: RE | Admit: 2018-03-16 | Discharge: 2018-03-16 | Disposition: A | Discharge status: Home / Self Care | Payer: Self-pay | Source: Ambulatory Visit | Attending: Cardiology | Admitting: Cardiology

## 2018-03-20 ENCOUNTER — Encounter (HOSPITAL_COMMUNITY): Payer: Self-pay

## 2018-03-22 ENCOUNTER — Encounter (HOSPITAL_COMMUNITY)
Admission: RE | Admit: 2018-03-22 | Discharge: 2018-03-22 | Disposition: A | Discharge status: Home / Self Care | Payer: Self-pay | Source: Ambulatory Visit | Attending: Cardiology | Admitting: Cardiology

## 2018-03-23 ENCOUNTER — Encounter (HOSPITAL_COMMUNITY)
Admission: RE | Admit: 2018-03-23 | Discharge: 2018-03-23 | Disposition: A | Discharge status: Home / Self Care | Payer: Self-pay | Source: Ambulatory Visit | Attending: Cardiology | Admitting: Cardiology

## 2018-03-27 ENCOUNTER — Encounter (HOSPITAL_COMMUNITY)
Admission: RE | Admit: 2018-03-27 | Discharge: 2018-03-27 | Disposition: A | Discharge status: Home / Self Care | Payer: Self-pay | Source: Ambulatory Visit | Attending: Cardiology | Admitting: Cardiology

## 2018-03-27 NOTE — Progress Notes (Signed)
Cardiology Office Note:    Date:  03/28/2018   ID:  [name], DOB 12-17-1950, MRN [id number]  PCP:  [name], MD  Cardiologist:  No primary care provider on file.    Referring MD: [name], MD   Chief Complaint  Patient presents with  . Coronary Artery Disease  . Aortic Stenosis  . Aortic Insuffiency  . Hyperlipidemia    History of Present Illness:    [name] is a 67 y.o. male with a hx of CAD, aortic stenosis and systolic HF. He had a non-STEMI in 2013 treated with PCI of the LCx/OM. LHC in 5/14 demonstrated severe 2 vessel CAD with critical in-stent restenosis in the LCx. He underwent CABG (SVG-PDA, SVG-LCx). He also has a history of aortic stenosis. Echo 10/2016 showed moderately reduced LVF at 35-40% with diffuse HK, mild AS and moderate AR.  Right and Left heart cath showed mild to moderate AS with AVA 0.837cm2 and EF 45% with patent SVG to RPDA and SVG to OM1 With occluded native vessels.  Medical therapy was continued    He is here today for followup and is doing well.  He denies any chest pain or pressure, SOB, DOE, PND, orthopnea, LE edema, dizziness, palpitations or syncope. He is compliant with his meds and is tolerating meds with no SE.     Past Medical History:  Diagnosis Date  . AAA (abdominal aortic aneurysm) (HCC)    (-)11/11,CT abdomen-no aneurysm aortic  . Aortic stenosis, mild 06/27/2014   Echo 3/17: EF 55-60%, normal wall motion, normal diastolic function, mild aortic stenosis (mean 17 mmHg), moderate AI, MAC  . Cancer [facility])    mediastinal seminoma resected 1985 - benign  . Chest pain   . Chronic kidney disease    kidney stones s/p lithotripsy  . Coronary artery disease 02/2012   60% mid RCA, 80% prox left circ, 99% distal left circ, normal LAD s/p PCI left circ/OM, 02/2012 -now s/p CABG  // ETT 3/17: No ischemic changes, Hypertensive BP response.  . Diabetes mellitus    neuropathy  insulin dependent  . Dyslipidemia   . Elevated  cholesterol   . Erectile dysfunction   . Erectile dysfunction   . GERD (gastroesophageal reflux disease)   . Hypertension   . Insomnia   . Insomnia   . NSTEMI (non-ST elevated myocardial infarction) ([place])   . Shortness of breath   . Stroke ([place])    TIA's  . TIA (transient ischemic attack)     Past Surgical History:  Procedure Laterality Date  . COLONOSCOPY  07/25/2012   Procedure: COLONOSCOPY;  Surgeon: Winfield Cunas., MD;  Location: [facility] ENDOSCOPY;  Service: Endoscopy;  Laterality: N/A;  . COLONOSCOPY N/A 12/02/2013   Procedure: COLONOSCOPY;  Surgeon: Winfield Cunas., MD;  Location: [facility] ENDOSCOPY;  Service: Endoscopy;  Laterality: N/A;  . CORONARY ARTERY BYPASS GRAFT N/A 01/04/2013   Procedure: CORONARY ARTERY BYPASS GRAFTING (CABG) times two using right saphenous vein harvested with endoscope.;  Surgeon: Ivin Poot, MD;  Location: [place];  Service: Open Heart Surgery;  Laterality: N/A;  . ESOPHAGOGASTRODUODENOSCOPY  07/20/2012   Procedure: ESOPHAGOGASTRODUODENOSCOPY (EGD);  Surgeon: Winfield Cunas., MD;  Location: [facility] ENDOSCOPY;  Service: Endoscopy;  Laterality: N/A;  . ESOPHAGOGASTRODUODENOSCOPY N/A 12/02/2013   Procedure: ESOPHAGOGASTRODUODENOSCOPY (EGD);  Surgeon: Winfield Cunas., MD;  Location: [facility] ENDOSCOPY;  Service: Endoscopy;  Laterality: N/A;  . HOT HEMOSTASIS N/A 12/02/2013   Procedure: HOT HEMOSTASIS (  ARGON PLASMA COAGULATION/BICAP);  Surgeon: Winfield Cunas., MD;  Location: [facility] ENDOSCOPY;  Service: Endoscopy;  Laterality: N/A;  . INTRAOPERATIVE TRANSESOPHAGEAL ECHOCARDIOGRAM N/A 01/04/2013   Procedure: INTRAOPERATIVE TRANSESOPHAGEAL ECHOCARDIOGRAM;  Surgeon: Ivin Poot, MD;  Location: [place];  Service: Open Heart Surgery;  Laterality: N/A;  . LEFT HEART CATHETERIZATION WITH CORONARY ANGIOGRAM N/A 03/06/2012   Procedure: LEFT HEART CATHETERIZATION WITH CORONARY ANGIOGRAM;  Surgeon: [name], MD;  Location: [facility] CATH LAB;  Service: Cardiovascular;   Laterality: N/A;  . lithotripsy    . RADIAL ARTERY HARVEST Left 01/04/2013   Procedure: RADIAL ARTERY HARVEST;  Surgeon: Ivin Poot, MD;  Location: [place];  Service: Vascular;  Laterality: Left;  Artery not havested. Unsuitable for use.  . resection mediastinal seminonma    . RIGHT/LEFT HEART CATH AND CORONARY/GRAFT ANGIOGRAPHY N/A 11/15/2016   Procedure: Right/Left Heart Cath and Coronary/Graft Angiography;  Surgeon: Leonie Man, MD;  Location: [place] CV LAB;  Service: Cardiovascular;  Laterality: N/A;    Current Medications: Current Meds  Medication Sig  . amitriptyline (ELAVIL) 100 MG tablet Take 100 mg by mouth at bedtime.  . B Complex-C (SUPER B COMPLEX PO) Take 1 tablet by mouth daily.  . B-D ULTRAFINE III SHORT PEN 31G X 8 MM MISC 1 each by Other route daily as needed (GLUCOSE TESTING (INSULINE NEEDLE)).   . Cholecalciferol (VITAMIN D-3) 5000 units TABS Take 5,000 Units by mouth daily.  Marland Kitchen HUMALOG KWIKPEN 100 UNIT/ML KiwkPen Inject 6-14 Units into the skin 3 (three) times daily with meals. Patient's dose is done on a sliding scale.  . hydroxypropyl methylcellulose / hypromellose (ISOPTO TEARS / GONIOVISC) 2.5 % ophthalmic solution Place 1 drop into both eyes 4 (four) times daily as needed for dry eyes.  . iron polysaccharides (NIFEREX) 150 MG capsule Take 150 mg by mouth at bedtime.   Marland Kitchen LANTUS SOLOSTAR 100 UNIT/ML Solostar Pen Inject 42 Units into the skin daily.   Marland Kitchen LEVEMIR FLEXTOUCH 100 UNIT/ML Pen Inject 1 Dose into the skin 2 (two) times daily.  . metFORMIN (GLUCOPHAGE) 500 MG tablet Take 1,000 mg by mouth 2 (two) times daily with a meal.  . metoprolol succinate (TOPROL-XL) 50 MG 24 hr tablet Take 1 and 1/2 tablets (68m) by mouth daily. Take with or immediately following a meal.  . Multiple Vitamin (MULTIVITAMIN) tablet Take 1 tablet by mouth daily.  . Omega-3 Fatty Acids (FISH OIL MAXIMUM STRENGTH) 1200 MG CAPS Take 3,600 mg by mouth daily.   . ONE TOUCH ULTRA TEST test  strip 1 each by Other route as needed for other.   . simvastatin (ZOCOR) 40 MG tablet Take 40 mg by mouth every evening.  . vitamin B-12 (CYANOCOBALAMIN) 1000 MCG tablet Take 1,000 mcg by mouth daily.  .Marland Kitchenzinc gluconate 50 MG tablet Take 50 mg by mouth daily.  .Marland Kitchenzolpidem (AMBIEN) 10 MG tablet Take 10 mg by mouth at bedtime.      Allergies:   Lipitor [atorvastatin] and Adhesive [tape]   Social History   Socioeconomic History  . Marital status: Married    Spouse name: Not on file  . Number of children: Not on file  . Years of education: Not on file  . Highest education [name]: Not on file  Occupational History  . Not on file  Social Needs  . Financial resource strain: Not on file  . Food insecurity:    Worry: Not on file    Inability: Not on file  .  Transportation needs:    Medical: Not on file    Non-medical: Not on file  Tobacco Use  . Smoking status: Former Smoker    Last attempt to quit: 03/06/1985    Years since quitting: 33.0  . Smokeless tobacco: Never Used  Substance and Sexual Activity  . Alcohol use: No  . Drug use: No  . Sexual activity: Not Currently  Lifestyle  . Physical activity:    Days per week: Not on file    Minutes per session: Not on file  . Stress: Not on file  Relationships  . Social connections:    Talks on phone: Not on file    Gets together: Not on file    Attends religious service: Not on file    Active member of club or organization: Not on file    Attends meetings of clubs or organizations: Not on file    Relationship status: Not on file  Other Topics Concern  . Not on file  Social History Narrative  . Not on file     Family History: The patient's family history includes Cancer in his brother; Diabetes in his father, mother, and sister; Heart failure in his mother; Hypertension in his mother.  ROS:   Please see the history of present illness.    ROS  All other systems reviewed and negative.   EKGs/Labs/Other Studies Reviewed:     The following studies were reviewed today: none  EKG:  EKG is ordered today and showed sinus tachycardia at 113bpm with LAE and nonspecific T wave abnormality   Recent Labs: No results found for requested labs within last 8760 hours.   Recent Lipid Panel    Component Value Date/Time   CHOL 122 (L) 05/16/2016 1030   TRIG 41 05/16/2016 1030   HDL 42 05/16/2016 1030   CHOLHDL 2.9 05/16/2016 1030   VLDL 8 05/16/2016 1030   LDLCALC 72 05/16/2016 1030    Physical Exam:    VS:  BP 126/78   Pulse (!) 113   Ht _0  (1.803 m)   Wt 177 lb 3.2 oz (80.4 kg)   SpO2 91%   BMI 24.71 kg/m     Wt Readings from Last 3 Encounters:  03/28/18 177 lb 3.2 oz (80.4 kg)  11/08/17 179 lb 1.6 oz (81.2 kg)  12/08/16 189 lb 6.4 oz (85.9 kg)     GEN:  Well nourished, well developed in no acute distress HEENT: Normal NECK: No JVD; No carotid bruits LYMPHATICS: No lymphadenopathy CARDIAC: regular but tachy, no rubs, gallops.  2/6 late peaking SM at RUSB RESPIRATORY:  Clear to auscultation without rales, wheezing or rhonchi  ABDOMEN: Soft, non-tender, non-distended MUSCULOSKELETAL:  No edema; No deformity  SKIN: Warm and dry NEUROLOGIC:  Alert and oriented x 3 PSYCHIATRIC:  Normal affect   ASSESSMENT:    1. Coronary artery disease involving native coronary artery of native heart without angina pectoris   2. Aortic stenosis, mild   3. Essential hypertension   4. Nonrheumatic aortic valve insufficiency   5. Dilated cardiomyopathy ([place])   6. Diabetes mellitus due to underlying condition with hyperosmolarity without coma, without long-term current use of insulin ([place])   7. Dyslipidemia    PLAN:    In order of problems listed above:  1.  ASCAD - s/p CABG with cath 10/2016 showing occluded [facility] with retgrograde filling via SVG to rPDA, 95% in-stent restenosis of pLCx stent and occluded OM1 stent, 40% pLAD and 50% D1, patent SVG  to rPDA and SVG to OM1.  Recently he has been having SOB  similar to when he had his stent put in.  This occurs at night and with exertion.  EKG shows ST but he has not taken his BB yet this am.   He will continue on medical therapy with BB, statin.  I instructed him to start ASA 47m daily.  I will get a 2D echo to assess LVF and if AS and AI appear worse then will set up for right and left heart cath otherwise will get stress myoview to rule out ischemia.    2.  Mild AS - mean gradient on echo 10/2016 was 143mg with AVA 0.93cm2.  Cath showed mild to moderate AS with mean AVG 2087m and peak to peak 15-68m76mwith AVA 0.85cm2.  CO 4.74.  On exam he has a late peaking systolic murmur at RUSB.  3.  HTN - BP controlled on exam today.  He will continue on Toprol XL 75mg54mly.  4.  Moderate AR - by echo 10/2016.  Repeat echo to make sure that AR has not progressed. Given SOB that is new I will repeat echo.   5.  Nonischemic DCM - echo 10/2016 showed Ef 35-40$ but 45% by subsequent cath.    6.  DM - followed by PCP.   7.  Hyperlipidemia - LDL goal is < 70.  He will continue on Simvastatin  40mg 53my.   8.  Tachycardia - he is tachycardic at 113bpm today but ran out of Toprol last night. He has only been taking 50mg d1m.  I will refill Toprol XL 75mg [id number] get a 48 hour Holter to assess HR    Medication Adjustments/Labs and Tests Ordered: Current medicines are reviewed at length with the patient today.  Concerns regarding medicines are outlined above.  Orders Placed This Encounter  Procedures  . EKG 12-Lead   No orders of the defined types were placed in this encounter.   Signed, Traci TuFransico Him14/2019 8:05 AM    [facility]

## 2018-03-28 ENCOUNTER — Telehealth (HOSPITAL_COMMUNITY): Payer: Self-pay

## 2018-03-28 ENCOUNTER — Encounter: Payer: Self-pay | Admitting: *Deleted

## 2018-03-28 ENCOUNTER — Encounter

## 2018-03-28 ENCOUNTER — Ambulatory Visit (INDEPENDENT_AMBULATORY_CARE_PROVIDER_SITE_OTHER): Payer: Medicare Other | Admitting: Cardiology

## 2018-03-28 ENCOUNTER — Encounter: Payer: Self-pay | Admitting: Cardiology

## 2018-03-28 VITALS — BP 126/78 | HR 113 | Ht 71.0 in | Wt 177.2 lb

## 2018-03-28 DIAGNOSIS — I2510 Atherosclerotic heart disease of native coronary artery without angina pectoris: Secondary | ICD-10-CM | POA: Diagnosis not present

## 2018-03-28 DIAGNOSIS — I420 Dilated cardiomyopathy: Secondary | ICD-10-CM

## 2018-03-28 DIAGNOSIS — E1165 Type 2 diabetes mellitus with hyperglycemia: Secondary | ICD-10-CM | POA: Diagnosis not present

## 2018-03-28 DIAGNOSIS — E0800 Diabetes mellitus due to underlying condition with hyperosmolarity without nonketotic hyperglycemic-hyperosmolar coma (NKHHC): Secondary | ICD-10-CM | POA: Diagnosis not present

## 2018-03-28 DIAGNOSIS — I10 Essential (primary) hypertension: Secondary | ICD-10-CM

## 2018-03-28 DIAGNOSIS — I350 Nonrheumatic aortic (valve) stenosis: Secondary | ICD-10-CM

## 2018-03-28 DIAGNOSIS — E785 Hyperlipidemia, unspecified: Secondary | ICD-10-CM

## 2018-03-28 DIAGNOSIS — I351 Nonrheumatic aortic (valve) insufficiency: Secondary | ICD-10-CM

## 2018-03-28 LAB — ALT: ALT: 21 IU/L (ref 0–44)

## 2018-03-28 LAB — LIPID PANEL
CHOL/HDL RATIO: 3 ratio (ref 0.0–5.0)
Cholesterol, Total: 104 mg/dL (ref 100–199)
HDL: 35 mg/dL — AB (ref >39)
LDL CALC: 58 mg/dL (ref 0–99)
TRIGLYCERIDES: 55 mg/dL (ref 0–149)
VLDL Cholesterol Cal: 11 mg/dL (ref 5–40)

## 2018-03-28 MED ORDER — METOPROLOL SUCCINATE ER 50 MG PO TB24
ORAL_TABLET | ORAL | 3 refills | Status: DC
Start: 2018-03-28 — End: 2018-04-10

## 2018-03-28 NOTE — Patient Instructions (Addendum)
Your physician recommends that you continue on your current medications as directed. Please refer to the Current Medication list given to you today.  Your physician has requested that you have en exercise stress myoview. For further information please visit [email or web address]. Please follow instruction sheet, as given.  Your physician has recommended that you wear a holter monitor. Holter monitors are medical devices that record the heart's electrical activity. Doctors most often use these monitors to diagnose arrhythmias. Arrhythmias are problems with the speed or rhythm of the heartbeat. The monitor is a small, portable device. You can wear one while you do your normal daily activities. This is usually used to diagnose what is causing palpitations/syncope (passing out).   Your physician has requested that you have an echocardiogram. Echocardiography is a painless test that uses sound waves to create images of your heart. It provides your doctor with information about the size and shape of your heart and how well your heart's chambers and valves are working. This procedure takes approximately one hour. There are no restrictions for this procedure.  Your physician recommends that you return for lab work now -lipids, alt  Your physician wants you to follow-up in: 1 year with Dr. Mallie Snooks will receive a reminder letter in the mail two months in advance. If you don't receive a letter, please call our office to schedule the follow-up appointment.

## 2018-03-28 NOTE — Telephone Encounter (Signed)
Pt contacted and instructions given. Pt stated he understood and would be here for his appointment. S.Asante Blanda EMTP

## 2018-03-29 ENCOUNTER — Other Ambulatory Visit: Payer: Self-pay | Admitting: Cardiology

## 2018-03-29 ENCOUNTER — Encounter (HOSPITAL_COMMUNITY)
Admission: RE | Admit: 2018-03-29 | Discharge: 2018-03-29 | Disposition: A | Discharge status: Home / Self Care | Payer: Self-pay | Source: Ambulatory Visit | Attending: Cardiology | Admitting: Cardiology

## 2018-03-29 DIAGNOSIS — I10 Essential (primary) hypertension: Secondary | ICD-10-CM

## 2018-03-29 DIAGNOSIS — I2510 Atherosclerotic heart disease of native coronary artery without angina pectoris: Secondary | ICD-10-CM

## 2018-03-29 DIAGNOSIS — I350 Nonrheumatic aortic (valve) stenosis: Secondary | ICD-10-CM

## 2018-03-29 DIAGNOSIS — R000 Tachycardia, unspecified: Secondary | ICD-10-CM

## 2018-03-29 DIAGNOSIS — E0800 Diabetes mellitus due to underlying condition with hyperosmolarity without nonketotic hyperglycemic-hyperosmolar coma (NKHHC): Secondary | ICD-10-CM

## 2018-03-29 DIAGNOSIS — I351 Nonrheumatic aortic (valve) insufficiency: Secondary | ICD-10-CM

## 2018-03-29 DIAGNOSIS — E785 Hyperlipidemia, unspecified: Secondary | ICD-10-CM

## 2018-03-29 DIAGNOSIS — I420 Dilated cardiomyopathy: Secondary | ICD-10-CM

## 2018-03-30 ENCOUNTER — Encounter (HOSPITAL_COMMUNITY)
Admission: RE | Admit: 2018-03-30 | Discharge: 2018-03-30 | Disposition: A | Discharge status: Home / Self Care | Payer: Self-pay | Source: Ambulatory Visit | Attending: Cardiology | Admitting: Cardiology

## 2018-04-02 ENCOUNTER — Ambulatory Visit (HOSPITAL_COMMUNITY): Payer: Medicare Other | Attending: Cardiology

## 2018-04-02 ENCOUNTER — Other Ambulatory Visit: Payer: Self-pay

## 2018-04-02 ENCOUNTER — Ambulatory Visit (INDEPENDENT_AMBULATORY_CARE_PROVIDER_SITE_OTHER): Payer: Medicare Other

## 2018-04-02 DIAGNOSIS — E785 Hyperlipidemia, unspecified: Secondary | ICD-10-CM | POA: Diagnosis not present

## 2018-04-02 DIAGNOSIS — I10 Essential (primary) hypertension: Secondary | ICD-10-CM | POA: Diagnosis not present

## 2018-04-02 DIAGNOSIS — I2510 Atherosclerotic heart disease of native coronary artery without angina pectoris: Secondary | ICD-10-CM

## 2018-04-02 DIAGNOSIS — R000 Tachycardia, unspecified: Secondary | ICD-10-CM | POA: Diagnosis not present

## 2018-04-02 DIAGNOSIS — I350 Nonrheumatic aortic (valve) stenosis: Secondary | ICD-10-CM

## 2018-04-02 DIAGNOSIS — E119 Type 2 diabetes mellitus without complications: Secondary | ICD-10-CM | POA: Insufficient documentation

## 2018-04-02 DIAGNOSIS — I351 Nonrheumatic aortic (valve) insufficiency: Secondary | ICD-10-CM

## 2018-04-02 DIAGNOSIS — I420 Dilated cardiomyopathy: Secondary | ICD-10-CM | POA: Diagnosis not present

## 2018-04-02 DIAGNOSIS — E0800 Diabetes mellitus due to underlying condition with hyperosmolarity without nonketotic hyperglycemic-hyperosmolar coma (NKHHC): Secondary | ICD-10-CM

## 2018-04-02 DIAGNOSIS — I083 Combined rheumatic disorders of mitral, aortic and tricuspid valves: Secondary | ICD-10-CM | POA: Insufficient documentation

## 2018-04-03 ENCOUNTER — Telehealth: Payer: Self-pay

## 2018-04-03 ENCOUNTER — Encounter (HOSPITAL_COMMUNITY)
Admission: RE | Admit: 2018-04-03 | Discharge: 2018-04-03 | Disposition: A | Discharge status: Home / Self Care | Payer: Self-pay | Source: Ambulatory Visit | Attending: Cardiology | Admitting: Cardiology

## 2018-04-03 NOTE — Telephone Encounter (Signed)
Notes recorded by Frederik Schmidt, RN on 04/03/2018 at 8:34 AM EDT Reviewed Echo results/recommendations with patient and wife. They verbalized understanding and will await further advice. ------

## 2018-04-03 NOTE — Telephone Encounter (Signed)
-----   Message from [name], MD sent at 04/02/2018  7:46 PM EDT ----- Echo showed moderately reduced LVF with EF 30-35% with moderate AS and mild AI but could be underestimated due to LV dysfunction.  The mean AVG has increased some since last echo (16>>70mmHg).  Mildly leaky MV and TV.  LVOT/LV dimensionless ratio is low at 0.22 indicating the AS may be more severe.I hvae personally reviewed the films and feel that AS is worse than indicated in report.  I will discuss with structural heart team

## 2018-04-03 NOTE — Progress Notes (Signed)
Pt has been made aware of normal result and verbalized understanding.  jw 04/03/18 Copy will be sent to Dr. Dorthy Cooler

## 2018-04-04 ENCOUNTER — Other Ambulatory Visit (HOSPITAL_COMMUNITY): Payer: Medicare Other

## 2018-04-04 ENCOUNTER — Ambulatory Visit (HOSPITAL_COMMUNITY): Payer: Medicare Other | Attending: Cardiovascular Disease

## 2018-04-04 DIAGNOSIS — I420 Dilated cardiomyopathy: Secondary | ICD-10-CM | POA: Diagnosis not present

## 2018-04-04 DIAGNOSIS — I351 Nonrheumatic aortic (valve) insufficiency: Secondary | ICD-10-CM

## 2018-04-04 DIAGNOSIS — I2510 Atherosclerotic heart disease of native coronary artery without angina pectoris: Secondary | ICD-10-CM | POA: Diagnosis not present

## 2018-04-04 DIAGNOSIS — I10 Essential (primary) hypertension: Secondary | ICD-10-CM

## 2018-04-04 DIAGNOSIS — E785 Hyperlipidemia, unspecified: Secondary | ICD-10-CM | POA: Diagnosis not present

## 2018-04-04 DIAGNOSIS — I350 Nonrheumatic aortic (valve) stenosis: Secondary | ICD-10-CM

## 2018-04-04 DIAGNOSIS — E0800 Diabetes mellitus due to underlying condition with hyperosmolarity without nonketotic hyperglycemic-hyperosmolar coma (NKHHC): Secondary | ICD-10-CM

## 2018-04-04 LAB — MYOCARDIAL PERFUSION IMAGING
CHL CUP MPHR: 152 {beats}/min
CHL CUP NUCLEAR SDS: 2
CHL CUP NUCLEAR SRS: 0
CHL CUP NUCLEAR SSS: 2
CHL CUP RESTING HR STRESS: 105 {beats}/min
CSEPEW: 10.1 METS
CSEPPHR: 133 {beats}/min
Exercise duration (min): 8 min
LV sys vol: 112 mL
LVDIAVOL: 157 mL (ref 62–150)
Percent HR: 87 %
RPE: 18
TID: 1.03

## 2018-04-04 MED ORDER — TECHNETIUM TC 99M TETROFOSMIN IV KIT
10.7000 | PACK | Freq: Once | INTRAVENOUS | Status: AC | PRN
  Administered 2018-04-04: 10.7 via INTRAVENOUS
  Filled 2018-04-04: qty 11

## 2018-04-04 MED ORDER — TECHNETIUM TC 99M TETROFOSMIN IV KIT
32.8000 | PACK | Freq: Once | INTRAVENOUS | Status: AC | PRN
  Administered 2018-04-04: 32.8 via INTRAVENOUS
  Filled 2018-04-04: qty 33

## 2018-04-05 ENCOUNTER — Telehealth: Payer: Self-pay

## 2018-04-05 ENCOUNTER — Encounter (HOSPITAL_COMMUNITY): Payer: Self-pay

## 2018-04-05 NOTE — Telephone Encounter (Signed)
-----   Message from Blenda Bridegroom sent at 04/05/2018  7:13 AM EDT ----- Regarding: High Risk Nuclear Study Wanted to let you know that I routed a High Risk Nuclear study to Dr Kyla Balzarine (DOD) in-box for Dr Johnsie Cancel to review. This test was ordered by Dr Radford Pax who is on vacation.  Thanks, Imagene Riches, CNMT

## 2018-04-05 NOTE — Telephone Encounter (Signed)
Dr. Johnsie Cancel advised patient to follow up with Dr. Radford Pax about stress test results. Called patient and made next available appointment with Dr. Radford Pax on 04/23/18. Patient verbalized understanding.

## 2018-04-06 ENCOUNTER — Encounter (HOSPITAL_COMMUNITY): Payer: Self-pay

## 2018-04-09 ENCOUNTER — Telehealth: Payer: Self-pay

## 2018-04-09 ENCOUNTER — Other Ambulatory Visit (HOSPITAL_COMMUNITY): Payer: Medicare Other

## 2018-04-09 NOTE — Telephone Encounter (Signed)
Called patient with results of stress test. Patient already has appointment with Dr. Angelena Form on Friday.

## 2018-04-10 ENCOUNTER — Telehealth: Payer: Self-pay

## 2018-04-10 ENCOUNTER — Encounter (HOSPITAL_COMMUNITY): Payer: Self-pay

## 2018-04-10 MED ORDER — METOPROLOL SUCCINATE ER 100 MG PO TB24: 100.0000 mg | ORAL_TABLET | Freq: Every day | ORAL | 3 refills | Status: DC

## 2018-04-10 NOTE — Telephone Encounter (Signed)
-----   Message from [name], MD sent at 04/09/2018  1:11 PM EDT ----- Please let patient know that heart monitor showed normal rhythm but average heart rate 109 bpm which is 2's fast.  Please increase Toprol XL to 100 mg daily.  He did have occasional extra heartbeats from the bottom of his heart as well.  These should be suppressed with the increased dose of beta-blocker.

## 2018-04-10 NOTE — Telephone Encounter (Signed)
Notes recorded by Frederik Schmidt, RN on 04/10/2018 at 8:30 AM EDT Informed patient of results/recommendations. He verbalized understanding and will make medication adjustments. ------

## 2018-04-11 ENCOUNTER — Inpatient Hospital Stay (HOSPITAL_COMMUNITY)
Admission: EM | Admit: 2018-04-11 | Discharge: 2018-04-16 | DRG: 286 | Disposition: A | Discharge status: Home / Self Care | Payer: Medicare Other | Attending: Cardiology | Admitting: Cardiology

## 2018-04-11 ENCOUNTER — Other Ambulatory Visit: Payer: Self-pay

## 2018-04-11 ENCOUNTER — Encounter (HOSPITAL_COMMUNITY): Payer: Self-pay | Admitting: Emergency Medicine

## 2018-04-11 ENCOUNTER — Telehealth (HOSPITAL_COMMUNITY): Payer: Self-pay | Admitting: *Deleted

## 2018-04-11 ENCOUNTER — Emergency Department (HOSPITAL_COMMUNITY): Payer: Medicare Other

## 2018-04-11 ENCOUNTER — Telehealth: Payer: Self-pay

## 2018-04-11 DIAGNOSIS — I509 Heart failure, unspecified: Secondary | ICD-10-CM

## 2018-04-11 DIAGNOSIS — I5021 Acute systolic (congestive) heart failure: Secondary | ICD-10-CM

## 2018-04-11 DIAGNOSIS — I110 Hypertensive heart disease with heart failure: Secondary | ICD-10-CM | POA: Diagnosis not present

## 2018-04-11 DIAGNOSIS — I350 Nonrheumatic aortic (valve) stenosis: Secondary | ICD-10-CM | POA: Diagnosis not present

## 2018-04-11 DIAGNOSIS — Z951 Presence of aortocoronary bypass graft: Secondary | ICD-10-CM

## 2018-04-11 DIAGNOSIS — I2584 Coronary atherosclerosis due to calcified coronary lesion: Secondary | ICD-10-CM | POA: Diagnosis present

## 2018-04-11 DIAGNOSIS — R079 Chest pain, unspecified: Secondary | ICD-10-CM | POA: Diagnosis present

## 2018-04-11 DIAGNOSIS — Z833 Family history of diabetes mellitus: Secondary | ICD-10-CM

## 2018-04-11 DIAGNOSIS — I5043 Acute on chronic combined systolic (congestive) and diastolic (congestive) heart failure: Secondary | ICD-10-CM | POA: Diagnosis not present

## 2018-04-11 DIAGNOSIS — Z9861 Coronary angioplasty status: Secondary | ICD-10-CM

## 2018-04-11 DIAGNOSIS — E785 Hyperlipidemia, unspecified: Secondary | ICD-10-CM | POA: Diagnosis present

## 2018-04-11 DIAGNOSIS — N182 Chronic kidney disease, stage 2 (mild): Secondary | ICD-10-CM | POA: Diagnosis present

## 2018-04-11 DIAGNOSIS — R0602 Shortness of breath: Secondary | ICD-10-CM | POA: Diagnosis not present

## 2018-04-11 DIAGNOSIS — Z91048 Other nonmedicinal substance allergy status: Secondary | ICD-10-CM

## 2018-04-11 DIAGNOSIS — Z79890 Hormone replacement therapy (postmenopausal): Secondary | ICD-10-CM

## 2018-04-11 DIAGNOSIS — E1122 Type 2 diabetes mellitus with diabetic chronic kidney disease: Secondary | ICD-10-CM | POA: Diagnosis present

## 2018-04-11 DIAGNOSIS — Z8673 Personal history of transient ischemic attack (TIA), and cerebral infarction without residual deficits: Secondary | ICD-10-CM

## 2018-04-11 DIAGNOSIS — Z794 Long term (current) use of insulin: Secondary | ICD-10-CM

## 2018-04-11 DIAGNOSIS — Z8249 Family history of ischemic heart disease and other diseases of the circulatory system: Secondary | ICD-10-CM

## 2018-04-11 DIAGNOSIS — I10 Essential (primary) hypertension: Secondary | ICD-10-CM | POA: Diagnosis present

## 2018-04-11 DIAGNOSIS — G4700 Insomnia, unspecified: Secondary | ICD-10-CM | POA: Diagnosis present

## 2018-04-11 DIAGNOSIS — R05 Cough: Secondary | ICD-10-CM | POA: Diagnosis not present

## 2018-04-11 DIAGNOSIS — I130 Hypertensive heart and chronic kidney disease with heart failure and stage 1 through stage 4 chronic kidney disease, or unspecified chronic kidney disease: Principal | ICD-10-CM | POA: Diagnosis present

## 2018-04-11 DIAGNOSIS — Z888 Allergy status to other drugs, medicaments and biological substances status: Secondary | ICD-10-CM

## 2018-04-11 DIAGNOSIS — I429 Cardiomyopathy, unspecified: Secondary | ICD-10-CM | POA: Diagnosis present

## 2018-04-11 DIAGNOSIS — E7800 Pure hypercholesterolemia, unspecified: Secondary | ICD-10-CM | POA: Diagnosis present

## 2018-04-11 DIAGNOSIS — E1140 Type 2 diabetes mellitus with diabetic neuropathy, unspecified: Secondary | ICD-10-CM | POA: Diagnosis present

## 2018-04-11 DIAGNOSIS — I2510 Atherosclerotic heart disease of native coronary artery without angina pectoris: Secondary | ICD-10-CM | POA: Diagnosis present

## 2018-04-11 DIAGNOSIS — I2582 Chronic total occlusion of coronary artery: Secondary | ICD-10-CM | POA: Diagnosis present

## 2018-04-11 DIAGNOSIS — Z923 Personal history of irradiation: Secondary | ICD-10-CM

## 2018-04-11 DIAGNOSIS — Z86018 Personal history of other benign neoplasm: Secondary | ICD-10-CM

## 2018-04-11 DIAGNOSIS — I252 Old myocardial infarction: Secondary | ICD-10-CM

## 2018-04-11 DIAGNOSIS — Z87891 Personal history of nicotine dependence: Secondary | ICD-10-CM

## 2018-04-11 HISTORY — DX: Acute on chronic combined systolic (congestive) and diastolic (congestive) heart failure: I50.43

## 2018-04-11 LAB — BASIC METABOLIC PANEL
ANION GAP: 8 (ref 5–15)
ANION GAP: 10 (ref 5–15)
BUN: 24 mg/dL — ABNORMAL HIGH (ref 8–23)
BUN: 19 mg/dL (ref 8–23)
CALCIUM: 9.1 mg/dL (ref 8.9–10.3)
CHLORIDE: 108 mmol/L (ref 98–111)
CO2: 24 mmol/L (ref 22–32)
CO2: 22 mmol/L (ref 22–32)
CREATININE: 1.54 mg/dL — AB (ref 0.61–1.24)
Calcium: 8.7 mg/dL — ABNORMAL LOW (ref 8.9–10.3)
Chloride: 110 mmol/L (ref 98–111)
Creatinine, Ser: 1.52 mg/dL — ABNORMAL HIGH (ref 0.61–1.24)
GFR calc Af Amer: 53 mL/min — ABNORMAL LOW (ref >60)
GFR calc non Af Amer: 45 mL/min — ABNORMAL LOW (ref >60)
GFR, EST AFRICAN AMERICAN: 52 mL/min — AB (ref >60)
GFR, EST NON AFRICAN AMERICAN: 45 mL/min — AB (ref >60)
Glucose, Bld: 75 mg/dL (ref 70–99)
Glucose, Bld: 135 mg/dL — ABNORMAL HIGH (ref 70–99)
POTASSIUM: 4 mmol/L (ref 3.5–5.1)
Potassium: 4.7 mmol/L (ref 3.5–5.1)
SODIUM: 140 mmol/L (ref 135–145)
SODIUM: 142 mmol/L (ref 135–145)

## 2018-04-11 LAB — CBG MONITORING, ED
Glucose-Capillary: 108 mg/dL — ABNORMAL HIGH (ref 70–99)
Glucose-Capillary: 53 mg/dL — ABNORMAL LOW (ref 70–99)
Glucose-Capillary: 142 mg/dL — ABNORMAL HIGH (ref 70–99)

## 2018-04-11 LAB — CBC WITH DIFFERENTIAL/PLATELET
ABS IMMATURE GRANULOCYTES: 0 10*3/uL (ref 0.0–0.1)
BASOS PCT: 2 %
Basophils Absolute: 0.1 10*3/uL (ref 0.0–0.1)
EOS ABS: 0.4 10*3/uL (ref 0.0–0.7)
Eosinophils Relative: 9 %
HCT: 44.2 % (ref 39.0–52.0)
Hemoglobin: 14.4 g/dL (ref 13.0–17.0)
Immature Granulocytes: 0 %
Lymphocytes Relative: 28 %
Lymphs Abs: 1.3 10*3/uL (ref 0.7–4.0)
MCH: 26.9 pg (ref 26.0–34.0)
MCHC: 32.6 g/dL (ref 30.0–36.0)
MCV: 82.6 fL (ref 78.0–100.0)
MONO ABS: 0.3 10*3/uL (ref 0.1–1.0)
MONOS PCT: 6 %
Neutro Abs: 2.5 10*3/uL (ref 1.7–7.7)
Neutrophils Relative %: 55 %
PLATELETS: 264 10*3/uL (ref 150–400)
RBC: 5.35 MIL/uL (ref 4.22–5.81)
RDW: 15 % (ref 11.5–15.5)
WBC: 4.5 10*3/uL (ref 4.0–10.5)

## 2018-04-11 LAB — CBC
HCT: 47.9 % (ref 39.0–52.0)
HEMOGLOBIN: 15.6 g/dL (ref 13.0–17.0)
MCH: 26.9 pg (ref 26.0–34.0)
MCHC: 32.6 g/dL (ref 30.0–36.0)
MCV: 82.4 fL (ref 78.0–100.0)
PLATELETS: 238 10*3/uL (ref 150–400)
RBC: 5.81 MIL/uL (ref 4.22–5.81)
RDW: 15 % (ref 11.5–15.5)
WBC: 4 10*3/uL (ref 4.0–10.5)

## 2018-04-11 LAB — I-STAT TROPONIN, ED: TROPONIN I, POC: 0.01 ng/mL (ref 0.00–0.08)

## 2018-04-11 LAB — TSH: TSH: 5.756 u[IU]/mL — AB (ref 0.350–4.500)

## 2018-04-11 LAB — HEMOGLOBIN A1C
HEMOGLOBIN A1C: 7.6 % — AB (ref 4.8–5.6)
MEAN PLASMA GLUCOSE: 171.42 mg/dL

## 2018-04-11 LAB — BRAIN NATRIURETIC PEPTIDE
B NATRIURETIC PEPTIDE 5: 957.5 pg/mL — AB (ref 0.0–100.0)
B Natriuretic Peptide: 682.8 pg/mL — ABNORMAL HIGH (ref 0.0–100.0)

## 2018-04-11 LAB — TROPONIN I: Troponin I: <0.03 ng/mL (ref <0.03)

## 2018-04-11 LAB — GLUCOSE, CAPILLARY: Glucose-Capillary: 215 mg/dL — ABNORMAL HIGH (ref 70–99)

## 2018-04-11 MED ORDER — ASPIRIN EC 81 MG PO TBEC
81.0000 mg | DELAYED_RELEASE_TABLET | Freq: Every day | ORAL | Status: DC
  Administered 2018-04-12 – 2018-04-16 (×5): 81 mg via ORAL
  Filled 2018-04-11 (×6): qty 1

## 2018-04-11 MED ORDER — METOPROLOL SUCCINATE ER 100 MG PO TB24
100.0000 mg | ORAL_TABLET | Freq: Every day | ORAL | Status: DC
  Administered 2018-04-12 (×2): 100 mg via ORAL
  Filled 2018-04-11 (×2): qty 1

## 2018-04-11 MED ORDER — ACETAMINOPHEN 325 MG PO TABS: 650.0000 mg | ORAL_TABLET | ORAL | Status: DC | PRN

## 2018-04-11 MED ORDER — SIMVASTATIN 40 MG PO TABS
40.0000 mg | ORAL_TABLET | Freq: Every day | ORAL | Status: DC
  Administered 2018-04-12 – 2018-04-15 (×5): 40 mg via ORAL
  Filled 2018-04-11 (×5): qty 1

## 2018-04-11 MED ORDER — HEPARIN SODIUM (PORCINE) 5000 UNIT/ML IJ SOLN
5000.0000 [IU] | Freq: Three times a day (TID) | INTRAMUSCULAR | Status: DC
  Administered 2018-04-12 – 2018-04-16 (×13): 5000 [IU] via SUBCUTANEOUS
  Filled 2018-04-11 (×13): qty 1

## 2018-04-11 MED ORDER — ASPIRIN 81 MG PO CHEW
324.0000 mg | CHEWABLE_TABLET | ORAL | Status: AC
  Administered 2018-04-12: 324 mg via ORAL
  Filled 2018-04-11: qty 4

## 2018-04-11 MED ORDER — HYPROMELLOSE (GONIOSCOPIC) 2.5 % OP SOLN: 1.0000 [drp] | Freq: Four times a day (QID) | OPHTHALMIC | Status: DC | PRN

## 2018-04-11 MED ORDER — IOPAMIDOL (ISOVUE-370) INJECTION 76%
INTRAVENOUS | Status: AC
  Filled 2018-04-11: qty 100

## 2018-04-11 MED ORDER — FUROSEMIDE 10 MG/ML IJ SOLN
40.0000 mg | Freq: Every day | INTRAMUSCULAR | Status: DC
  Administered 2018-04-12: 40 mg via INTRAVENOUS
  Filled 2018-04-11 (×2): qty 4

## 2018-04-11 MED ORDER — ONDANSETRON HCL 4 MG/2ML IJ SOLN: 4.0000 mg | Freq: Four times a day (QID) | INTRAMUSCULAR | Status: DC | PRN

## 2018-04-11 MED ORDER — NITROGLYCERIN 0.4 MG SL SUBL: 0.4000 mg | SUBLINGUAL_TABLET | SUBLINGUAL | Status: DC | PRN

## 2018-04-11 MED ORDER — ASPIRIN 300 MG RE SUPP: 300.0000 mg | RECTAL | Status: AC

## 2018-04-11 MED ORDER — FUROSEMIDE 10 MG/ML IJ SOLN: 40.0000 mg | Freq: Once | INTRAMUSCULAR | Status: DC

## 2018-04-11 MED ORDER — LEVOTHYROXINE SODIUM 50 MCG PO TABS
50.0000 ug | ORAL_TABLET | Freq: Every day | ORAL | Status: DC
  Administered 2018-04-12 – 2018-04-16 (×5): 50 ug via ORAL
  Filled 2018-04-11 (×5): qty 1

## 2018-04-11 MED ORDER — INSULIN ASPART 100 UNIT/ML ~~LOC~~ SOLN
0.0000 [IU] | Freq: Three times a day (TID) | SUBCUTANEOUS | Status: DC
  Administered 2018-04-12 (×3): 3 [IU] via SUBCUTANEOUS
  Administered 2018-04-13: 2 [IU] via SUBCUTANEOUS
  Administered 2018-04-14: 5 [IU] via SUBCUTANEOUS
  Administered 2018-04-14 – 2018-04-15 (×3): 3 [IU] via SUBCUTANEOUS
  Administered 2018-04-15 – 2018-04-16 (×3): 5 [IU] via SUBCUTANEOUS

## 2018-04-11 NOTE — Telephone Encounter (Signed)
Spoke to Joiner from [facility] who had called in regards to patient's SOB and edema in his lower extremities.  He has not been to rehab lately, because his SOB has worsened over the past 2 weeks.  Seth Bake recommended that he should go to the emergency dept. and not wait until his scheduled OV this Friday with Dr Angelena Form.  I telephoned patient who did sound SOB over the telephone.  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  His wife is to return shortly and I informed him of Andrea's suggestion, which he will carry out.

## 2018-04-11 NOTE — ED Notes (Signed)
Patient transported to X-ray 

## 2018-04-11 NOTE — ED Triage Notes (Signed)
Onset shortness of breath for a while and recently worsening especially laying in supine position. Denies chest pain. Did have stress test last week.

## 2018-04-11 NOTE — Telephone Encounter (Signed)
Agree. I have not met him yet. I think he was supposed to see me Friday in the TAVR clinic. I will have Dr. Radford Pax comment on her recommendations. [name]

## 2018-04-11 NOTE — H&P (Signed)
Cardiology Admission History and Physical:   Patient ID: [name]; MRN: [id number]; DOB: 03/04/1951   Admission date: 04/11/2018  Primary Care Provider: [name], MD Primary Cardiologist: Radford Pax Primary Electrophysiologist:  None   Chief Complaint:  Dyspnea  Patient Profile:   [name] is a 67 y.o. male with a history of CABG, CRF and AV disease being admitted for recurrent CHF  History of Present Illness:   [name] 66 y.o. CABG in 2014 with SVG to PDA and SVG Circumflex. He has had two sternotomies already with distant history of seminoma surgery followed by radiation. Has had increasing CHF and was to be evaluated by Dr Lucienne Minks Friday for TAVR. Last cath by Dr Ellyn Hack 11/15/16 showed patent vein grafts and only mold to moderate LAD/Diagonal disease Rx medically. Echo done 04/02/18 showed worsening EF 30-35% with likely low gradient severe AS mean gradient 20 mmHg peak 34 mmHg VTI ratio .22 and mild AR. Does have some CRF with Cr on admission 1.54.  Has been having increasing dyspnea last few weeks with weight gain and some LE edema PND and orthopnea. BNP is elevated at 957.  Has been compliant with meds. No chest pain  Had nuclear study done 04/04/18 can;t find report but review of images shows no ischemia to my eye    Past Medical History:  Diagnosis Date  . AAA (abdominal aortic aneurysm) (HCC)    (-)11/11,CT abdomen-no aneurysm aortic  . Aortic stenosis, mild 06/27/2014   Echo 3/17: EF 55-60%, normal wall motion, normal diastolic function, mild aortic stenosis (mean 17 mmHg), moderate AI, MAC  . Cancer [facility])    mediastinal seminoma resected 1985 - benign  . Chest pain   . Chronic kidney disease    kidney stones s/p lithotripsy  . Coronary artery disease 02/2012   60% mid RCA, 80% prox left circ, 99% distal left circ, normal LAD s/p PCI left circ/OM, 02/2012 -now s/p CABG  // ETT 3/17: No ischemic changes, Hypertensive BP response.  . Diabetes mellitus    neuropathy  insulin dependent  . Dyslipidemia   . Elevated cholesterol   . Erectile dysfunction   . Erectile dysfunction   . GERD (gastroesophageal reflux disease)   . Hypertension   . Insomnia   . Insomnia   . NSTEMI (non-ST elevated myocardial infarction) ([place])   . Shortness of breath   . Stroke ([place])    TIA's  . TIA (transient ischemic attack)     Past Surgical History:  Procedure Laterality Date  . COLONOSCOPY  07/25/2012   Procedure: COLONOSCOPY;  Surgeon: Winfield Cunas., MD;  Location: [facility] ENDOSCOPY;  Service: Endoscopy;  Laterality: N/A;  . COLONOSCOPY N/A 12/02/2013   Procedure: COLONOSCOPY;  Surgeon: Winfield Cunas., MD;  Location: [facility] ENDOSCOPY;  Service: Endoscopy;  Laterality: N/A;  . CORONARY ARTERY BYPASS GRAFT N/A 01/04/2013   Procedure: CORONARY ARTERY BYPASS GRAFTING (CABG) times two using right saphenous vein harvested with endoscope.;  Surgeon: Ivin Poot, MD;  Location: [place];  Service: Open Heart Surgery;  Laterality: N/A;  . ESOPHAGOGASTRODUODENOSCOPY  07/20/2012   Procedure: ESOPHAGOGASTRODUODENOSCOPY (EGD);  Surgeon: Winfield Cunas., MD;  Location: [facility] ENDOSCOPY;  Service: Endoscopy;  Laterality: N/A;  . ESOPHAGOGASTRODUODENOSCOPY N/A 12/02/2013   Procedure: ESOPHAGOGASTRODUODENOSCOPY (EGD);  Surgeon: Winfield Cunas., MD;  Location: [facility] ENDOSCOPY;  Service: Endoscopy;  Laterality: N/A;  . HOT HEMOSTASIS N/A 12/02/2013   Procedure: HOT HEMOSTASIS (ARGON PLASMA COAGULATION/BICAP);  Surgeon: Joyice Faster  Rolla Flatten., MD;  Location: [facility] ENDOSCOPY;  Service: Endoscopy;  Laterality: N/A;  . INTRAOPERATIVE TRANSESOPHAGEAL ECHOCARDIOGRAM N/A 01/04/2013   Procedure: INTRAOPERATIVE TRANSESOPHAGEAL ECHOCARDIOGRAM;  Surgeon: Ivin Poot, MD;  Location: [place];  Service: Open Heart Surgery;  Laterality: N/A;  . LEFT HEART CATHETERIZATION WITH CORONARY ANGIOGRAM N/A 03/06/2012   Procedure: LEFT HEART CATHETERIZATION WITH CORONARY ANGIOGRAM;  Surgeon: [name],  MD;  Location: [place] CATH LAB;  Service: Cardiovascular;  Laterality: N/A;  . lithotripsy    . RADIAL ARTERY HARVEST Left 01/04/2013   Procedure: RADIAL ARTERY HARVEST;  Surgeon: Ivin Poot, MD;  Location: [place];  Service: Vascular;  Laterality: Left;  Artery not havested. Unsuitable for use.  . resection mediastinal seminonma    . RIGHT/LEFT HEART CATH AND CORONARY/GRAFT ANGIOGRAPHY N/A 11/15/2016   Procedure: Right/Left Heart Cath and Coronary/Graft Angiography;  Surgeon: Leonie Man, MD;  Location: [place] CV LAB;  Service: Cardiovascular;  Laterality: N/A;     Medications Prior to Admission: Prior to Admission medications   Medication Sig Start Date End Date Taking? Authorizing Provider  amitriptyline (ELAVIL) 100 MG tablet Take 100 mg by mouth daily.    Yes [provider]  B Complex-C (SUPER B COMPLEX PO) Take 1 tablet by mouth daily.   Yes [provider]  HUMALOG KWIKPEN 100 UNIT/ML KiwkPen Per pump;  0.8 unit(s)/per hour average and patient gives a bolus, if needed 10/05/16  Yes [provider]  hydroxypropyl methylcellulose / hypromellose (ISOPTO TEARS / GONIOVISC) 2.5 % ophthalmic solution Place 1 drop into both eyes 4 (four) times daily as needed for dry eyes.   Yes [provider]  LEVEMIR FLEXTOUCH 100 UNIT/ML Pen Inject 30 Units into the skin daily after breakfast.  01/14/18  Yes [provider]  levothyroxine (SYNTHROID, LEVOTHROID) 50 MCG tablet Take 50 mcg by mouth daily before breakfast.   Yes [provider]  metFORMIN (GLUCOPHAGE) 500 MG tablet Take 1,000 mg by mouth 2 (two) times daily with a meal. 03/07/12  Yes Turner, Eber Hong, MD  metoprolol succinate (TOPROL-XL) 100 MG 24 hr tablet Take 1 tablet (100 mg total) by mouth daily. Take with or immediately following a meal. 04/10/18  Yes Turner, Eber Hong, MD  Multiple Vitamin (MULTIVITAMIN) tablet Take 1 tablet by mouth daily.   Yes [provider]  simvastatin  (ZOCOR) 40 MG tablet Take 40 mg by mouth every evening.   Yes [provider]  zolpidem (AMBIEN) 10 MG tablet Take 10 mg by mouth daily.    Yes [provider]  B-D ULTRAFINE III SHORT PEN 31G X 8 MM MISC 1 each by Other route daily as needed (GLUCOSE TESTING (INSULINE NEEDLE)).  04/22/13   [provider]  ONE TOUCH ULTRA TEST test strip 1 each by Other route as needed for other.  01/15/13   [provider]     Allergies:    Allergies  Allergen Reactions  . Lipitor [Atorvastatin] Other (See Comments)    Muscle pain  . Adhesive [Tape] Rash    Social History:   Social History   Socioeconomic History  . Marital status: Married    Spouse name: Not on file  . Number of children: Not on file  . Years of education: Not on file  . Highest education level: Not on file  Occupational History  . Not on file  Social Needs  . Financial resource strain: Not on file  . Food insecurity:  Worry: Not on file    Inability: Not on file  . Transportation needs:    Medical: Not on file    Non-medical: Not on file  Tobacco Use  . Smoking status: Former Smoker    Last attempt to quit: 03/06/1985    Years since quitting: 33.1  . Smokeless tobacco: Never Used  Substance and Sexual Activity  . Alcohol use: No  . Drug use: No  . Sexual activity: Not Currently  Lifestyle  . Physical activity:    Days per week: Not on file    Minutes per session: Not on file  . Stress: Not on file  Relationships  . Social connections:    Talks on phone: Not on file    Gets together: Not on file    Attends religious service: Not on file    Active member of club or organization: Not on file    Attends meetings of clubs or organizations: Not on file    Relationship status: Not on file  . Intimate partner violence:    Fear of current or ex partner: Not on file    Emotionally abused: Not on file    Physically abused: Not on file    Forced sexual activity: Not on file  Other  Topics Concern  . Not on file  Social History Narrative  . Not on file    Family History:    The patient's family history includes Cancer in his brother; Diabetes in his father, mother, and sister; Heart failure in his mother; Hypertension in his mother.    ROS:  Please see the history of present illness.  All other ROS reviewed and negative.     Physical Exam/Data:   Vitals:   04/11/18 1429 04/11/18 1455 04/11/18 1645 04/11/18 1730  BP:  111/86 116/83 101/85  Pulse:  95 (!) 101 (!) 102  Resp:  18 (!) 27 (!) 22  Temp:  (!) 97.5 F (36.4 C)    TempSrc:  Oral    SpO2:  96% 94% 92%  Weight: 79.4 kg     Height: 5' 11"  (1.803 m)      No intake or output data in the 24 hours ending 04/11/18 1818 Filed Weights   04/11/18 1429  Weight: 79.4 kg   Body mass index is 24.41 kg/m.  General:  Thin black male in no distress  HEENT: normal Lymph: no adenopathy Neck: JVP elevated  Endocrine:  No thryomegaly Vascular: No carotid bruits; FA pulses 2+ bilaterally without bruits  Cardiac:  S1 S2 AS murmur and AR murmur  Lungs:  Bibasilar rales  Abd: soft, nontender, no hepatomegaly  Ext: Plus one bilateral edema  Musculoskeletal:  No deformities, BUE and BLE strength normal and equal Skin: warm and dry  Neuro:  CNs 2-12 intact, no focal abnormalities noted Psych:  Normal affect    EKG:   SR LVH no acute changes   Relevant CV Studies: See HPI  Laboratory Data:  Chemistry Recent Labs  Lab 04/11/18 1431  NA 140  K 4.7  CL 108  CO2 24  GLUCOSE 75  BUN 24*  CREATININE 1.54*  CALCIUM 9.1  GFRNONAA 45*  GFRAA 52*  ANIONGAP 8    No results for input(s): PROT, ALBUMIN, AST, ALT, ALKPHOS, BILITOT in the last 168 hours. Hematology Recent Labs  Lab 04/11/18 1431  WBC 4.0  RBC 5.81  HGB 15.6  HCT 47.9  MCV 82.4  MCH 26.9  MCHC 32.6  RDW 15.0  PLT 238   Cardiac EnzymesNo results for input(s): TROPONINI in the last 168 hours.  Recent Labs  Lab 04/11/18 1517    TROPIPOC 0.01    BNP Recent Labs  Lab 04/11/18 1432  BNP 957.5*    DDimer No results for input(s): DDIMER in the last 168 hours.  Radiology/Studies:  Dg Chest 2 View  Result Date: 04/11/2018 CLINICAL DATA:  Onset of chest pain this morning while sitting position, worsened while lying supine: Dry cough, history coronary artery disease post MI and CABG, abdominal aortic aneurysm, seminoma, diabetes mellitus, hypertension, former smoker EXAM: CHEST - 2 VIEW COMPARISON:  03/20/2013 FINDINGS: Upper normal size of cardiac silhouette post CABG. Atherosclerotic calcifications aorta and coronary arteries. Mediastinal contours and pulmonary vascularity normal. Mild bibasilar atelectasis versus infiltrate and probable small bibasilar effusions. Remaining lungs clear. No acute infiltrate or pneumothorax. Question 5 mm nodular density RIGHT upper lobe. IMPRESSION: Bibasilar atelectasis versus infiltrate and probable small pleural effusions. Question new 5 mm RIGHT upper lobe nodule; CT chest recommended to exclude pulmonary nodule. Electronically Signed   By: Lavonia Dana M.D.   On: 04/11/2018 15:50    Assessment and Plan:   1. CHF:  Has ischemic DCM with previous CABG No chest pain and grafts patent by cath 11/2016. EF has fallen 30-35% from 35-40% by TTE done last March. Recent myovue non ischemic suggesting valve disease may be responsible for deterioration. Would agree that despite young age would not want to have 3rd sternotomy (seminoma and CABG) in setting of previous chest radiation as well. Will give iv lasix in ER and another dose in am Tentative right and left cath in am with possible inpatient TAVR evaluation. Will need to stage CT exams and possibly use low dose protocol given CKD.  2. Abnormal CXR:  ? 5 mm nodular density RUL with bibasilar atelectasis vs infiltrate small effusions Don't think non contrast CT imperative at this point may do latter after CHF has cleared  3. DM:  Hold metformin  prior to cath can cover with SS insulin 4. HLD continue statin  5. Thyroid:  On synthroid replacement check TSH in am no recent one in Epic   Severity of Illness:    For questions or updates, please contact [place] Please consult [email or web address] for contact info under Cardiology/STEMI.    Signed, Jenkins Rouge, MD  04/11/2018 6:18 PM

## 2018-04-11 NOTE — ED Notes (Signed)
Pt CBG was 142, notified Michael(RN)

## 2018-04-11 NOTE — ED Provider Notes (Signed)
[place] EMERGENCY DEPARTMENT Provider Note   CSN: [id number] Arrival date & time: 04/11/18  1418     History   Chief Complaint Chief Complaint  Patient presents with  . Shortness of Breath    HPI JALAN FARISS is a 67 y.o. male.  HPI Patient is a 66 year old male who is been following closely with the cardiology team regarding his shortness of breath and known aortic stenosis.  He underwent recent stress Myoview which demonstrated reduced EF of 30 to 35% and possible worsening of his aortic stenosis.  He presents today with worsening exertional shortness of breath and orthopnea.  He states he can barely walk 10 feet without coming extremely winded.  No history of DVT or pulmonary embolism.  No history of atrial fibrillation.  No unilateral leg swelling.  No recent long travel or surgery.  No chest pain at this time.  History of CABG.  He contacted the cardiology office today regarding his worsening shortness of breath and was recommended that he come to the ER for further evaluation.   Past Medical History:  Diagnosis Date  . AAA (abdominal aortic aneurysm) (HCC)    (-)11/11,CT abdomen-no aneurysm aortic  . Aortic stenosis, mild 06/27/2014   Echo 3/17: EF 55-60%, normal wall motion, normal diastolic function, mild aortic stenosis (mean 17 mmHg), moderate AI, MAC  . Cancer [facility])    mediastinal seminoma resected 1985 - benign  . Chest pain   . Chronic kidney disease    kidney stones s/p lithotripsy  . Coronary artery disease 02/2012   60% mid RCA, 80% prox left circ, 99% distal left circ, normal LAD s/p PCI left circ/OM, 02/2012 -now s/p CABG  // ETT 3/17: No ischemic changes, Hypertensive BP response.  . Diabetes mellitus    neuropathy  insulin dependent  . Dyslipidemia   . Elevated cholesterol   . Erectile dysfunction   . Erectile dysfunction   . GERD (gastroesophageal reflux disease)   . Hypertension   . Insomnia   . Insomnia   . NSTEMI (non-ST  elevated myocardial infarction) ([place])   . Shortness of breath   . Stroke ([place])    TIA's  . TIA (transient ischemic attack)     Patient Active Problem List   Diagnosis Date Noted  . Cardiomyopathy ([place]) 11/11/2016  . Aortic stenosis, mild 06/27/2014  . Aortic valve regurgitation 06/27/2014  . Sinus tachycardia 06/17/2014  . AAA (abdominal aortic aneurysm) ([place])   . Diabetes mellitus   . Insomnia   . Stroke ([place])   . Hypertension   . TIA (transient ischemic attack)   . DM (diabetes mellitus) ([place]) 12/28/2012  . Dyslipidemia 12/28/2012  . CAD (coronary artery disease), native coronary artery 02/13/2012    Past Surgical History:  Procedure Laterality Date  . COLONOSCOPY  07/25/2012   Procedure: COLONOSCOPY;  Surgeon: Winfield Cunas., MD;  Location: [facility] ENDOSCOPY;  Service: Endoscopy;  Laterality: N/A;  . COLONOSCOPY N/A 12/02/2013   Procedure: COLONOSCOPY;  Surgeon: Winfield Cunas., MD;  Location: [facility] ENDOSCOPY;  Service: Endoscopy;  Laterality: N/A;  . CORONARY ARTERY BYPASS GRAFT N/A 01/04/2013   Procedure: CORONARY ARTERY BYPASS GRAFTING (CABG) times two using right saphenous vein harvested with endoscope.;  Surgeon: Ivin Poot, MD;  Location: [place];  Service: Open Heart Surgery;  Laterality: N/A;  . ESOPHAGOGASTRODUODENOSCOPY  07/20/2012   Procedure: ESOPHAGOGASTRODUODENOSCOPY (EGD);  Surgeon: Winfield Cunas., MD;  Location: [facility] ENDOSCOPY;  Service: Endoscopy;  Laterality: N/A;  . ESOPHAGOGASTRODUODENOSCOPY N/A 12/02/2013   Procedure: ESOPHAGOGASTRODUODENOSCOPY (EGD);  Surgeon: Winfield Cunas., MD;  Location: [facility] ENDOSCOPY;  Service: Endoscopy;  Laterality: N/A;  . HOT HEMOSTASIS N/A 12/02/2013   Procedure: HOT HEMOSTASIS (ARGON PLASMA COAGULATION/BICAP);  Surgeon: Winfield Cunas., MD;  Location: [facility] ENDOSCOPY;  Service: Endoscopy;  Laterality: N/A;  . INTRAOPERATIVE TRANSESOPHAGEAL ECHOCARDIOGRAM N/A 01/04/2013   Procedure: INTRAOPERATIVE TRANSESOPHAGEAL  ECHOCARDIOGRAM;  Surgeon: Ivin Poot, MD;  Location: [place];  Service: Open Heart Surgery;  Laterality: N/A;  . LEFT HEART CATHETERIZATION WITH CORONARY ANGIOGRAM N/A 03/06/2012   Procedure: LEFT HEART CATHETERIZATION WITH CORONARY ANGIOGRAM;  Surgeon: [name], MD;  Location: [place] CATH LAB;  Service: Cardiovascular;  Laterality: N/A;  . lithotripsy    . RADIAL ARTERY HARVEST Left 01/04/2013   Procedure: RADIAL ARTERY HARVEST;  Surgeon: Ivin Poot, MD;  Location: [place];  Service: Vascular;  Laterality: Left;  Artery not havested. Unsuitable for use.  . resection mediastinal seminonma    . RIGHT/LEFT HEART CATH AND CORONARY/GRAFT ANGIOGRAPHY N/A 11/15/2016   Procedure: Right/Left Heart Cath and Coronary/Graft Angiography;  Surgeon: Leonie Man, MD;  Location: [place] CV LAB;  Service: Cardiovascular;  Laterality: N/A;        Home Medications    Prior to Admission medications   Medication Sig Start Date End Date Taking? Authorizing Provider  amitriptyline (ELAVIL) 100 MG tablet Take 100 mg by mouth daily.    Yes [provider]  B Complex-C (SUPER B COMPLEX PO) Take 1 tablet by mouth daily.   Yes [provider]  HUMALOG KWIKPEN 100 UNIT/ML KiwkPen Per pump;  0.8 unit(s)/per hour average and patient gives a bolus, if needed 10/05/16  Yes [provider]  hydroxypropyl methylcellulose / hypromellose (ISOPTO TEARS / GONIOVISC) 2.5 % ophthalmic solution Place 1 drop into both eyes 4 (four) times daily as needed for dry eyes.   Yes [provider]  LEVEMIR FLEXTOUCH 100 UNIT/ML Pen Inject 30 Units into the skin daily after breakfast.  01/14/18  Yes [provider]  levothyroxine (SYNTHROID, LEVOTHROID) 50 MCG tablet Take 50 mcg by mouth daily before breakfast.   Yes [provider]  metFORMIN (GLUCOPHAGE) 500 MG tablet Take 1,000 mg by mouth 2 (two) times daily with a meal. 03/07/12  Yes Turner, Eber Hong, MD  metoprolol succinate  (TOPROL-XL) 100 MG 24 hr tablet Take 1 tablet (100 mg total) by mouth daily. Take with or immediately following a meal. 04/10/18  Yes Turner, Eber Hong, MD  Multiple Vitamin (MULTIVITAMIN) tablet Take 1 tablet by mouth daily.   Yes [provider]  simvastatin (ZOCOR) 40 MG tablet Take 40 mg by mouth every evening.   Yes [provider]  zolpidem (AMBIEN) 10 MG tablet Take 10 mg by mouth daily.    Yes [provider]  B-D ULTRAFINE III SHORT PEN 31G X 8 MM MISC 1 each by Other route daily as needed (GLUCOSE TESTING (INSULINE NEEDLE)).  04/22/13   [provider]  ONE TOUCH ULTRA TEST test strip 1 each by Other route as needed for other.  01/15/13   [provider]    Family History Family History  Problem Relation Age of Onset  . Diabetes Father   . Diabetes Mother   . Heart failure Mother   . Hypertension Mother   . Cancer Brother   . Diabetes Sister     Social History Social History  Tobacco Use  . Smoking status: Former Smoker    Last attempt to quit: 03/06/1985    Years since quitting: 33.1  . Smokeless tobacco: Never Used  Substance Use Topics  . Alcohol use: No  . Drug use: No     Allergies   Lipitor [atorvastatin] and Adhesive [tape]   Review of Systems Review of Systems  All other systems reviewed and are negative.    Physical Exam Updated Vital Signs BP 101/85   Pulse (!) 102   Temp (!) 97.5 F (36.4 C) (Oral)   Resp (!) 22   Ht _0  (1.803 m)   Wt 79.4 kg   SpO2 92%   BMI 24.41 kg/m   Physical Exam  Constitutional: He is oriented to person, place, and time. He appears well-developed and well-nourished.  HENT:  Head: Normocephalic and atraumatic.  Eyes: EOM are normal.  Neck: Normal range of motion.  Cardiovascular: Normal rate, regular rhythm, normal heart sounds and intact distal pulses.  Pulmonary/Chest: Effort normal. No respiratory distress. He has rales.  Abdominal: Soft. He exhibits no distension.  There is no tenderness.  Musculoskeletal: Normal range of motion.  Trace edema bilaterally  Neurological: He is alert and oriented to person, place, and time.  Skin: Skin is warm and dry.  Psychiatric: He has a normal mood and affect. Judgment normal.  Nursing note and vitals reviewed.    ED Treatments / Results  Labs (all labs ordered are listed, but only abnormal results are displayed) Labs Reviewed  BASIC METABOLIC PANEL - Abnormal; Notable for the following components:      Result Value   BUN 24 (*)    Creatinine, Ser 1.54 (*)    GFR calc non Af Amer 45 (*)    GFR calc Af Amer 52 (*)    All other components within normal limits  BRAIN NATRIURETIC PEPTIDE - Abnormal; Notable for the following components:   B Natriuretic Peptide 957.5 (*)    All other components within normal limits  CBG MONITORING, ED - Abnormal; Notable for the following components:   Glucose-Capillary 53 (*)    All other components within normal limits  CBG MONITORING, ED - Abnormal; Notable for the following components:   Glucose-Capillary 108 (*)    All other components within normal limits  CBC  I-STAT TROPONIN, ED    EKG EKG Interpretation  Date/Time:  Wednesday April 11 2018 14:22:46 EDT Ventricular Rate:  96 PR Interval:  200 QRS Duration: 88 QT Interval:  382 QTC Calculation: 482 R Axis:   108 Text Interpretation:  Normal sinus rhythm with sinus arrhythmia Rightward axis Left ventricular hypertrophy Prolonged QT Abnormal ECG No significant change was found Confirmed by Jola Schmidt [phone]) on 04/11/2018 6:25:53 PM   Radiology Dg Chest 2 View  Result Date: 04/11/2018 CLINICAL DATA:  Onset of chest pain this morning while sitting position, worsened while lying supine: Dry cough, history coronary artery disease post MI and CABG, abdominal aortic aneurysm, seminoma, diabetes mellitus, hypertension, former smoker EXAM: CHEST - 2 VIEW COMPARISON:  03/20/2013 FINDINGS: Upper normal size of  cardiac silhouette post CABG. Atherosclerotic calcifications aorta and coronary arteries. Mediastinal contours and pulmonary vascularity normal. Mild bibasilar atelectasis versus infiltrate and probable small bibasilar effusions. Remaining lungs clear. No acute infiltrate or pneumothorax. Question 5 mm nodular density RIGHT upper lobe. IMPRESSION: Bibasilar atelectasis versus infiltrate and probable small pleural effusions. Question new 5 mm RIGHT upper lobe nodule; CT chest recommended to exclude pulmonary nodule. Electronically  Signed   By: Lavonia Dana M.D.   On: 04/11/2018 15:50    Procedures Procedures (including critical care time)  Medications Ordered in ED Medications  iopamidol (ISOVUE-370) 76 % injection (has no administration in time range)  furosemide (LASIX) injection 40 mg (has no administration in time range)     Initial Impression / Assessment and Plan / ED Course  I have reviewed the triage vital signs and the nursing notes.  Pertinent labs & imaging results that were available during my care of the patient were reviewed by me and considered in my medical decision making (see chart for details).     Work-up in the emergency department concerning for congestive heart failure.  He will need a CT scan of his chest to further evaluate the pulmonary nodule.  No clinical evidence to suggest pulmonary embolism at this time.  Discussed case with cardiology who will see patient in consultation and plan for IV diuresis and admission to the hospital.  Consultation: Dr Ellyn Hack  Final Clinical Impressions(s) / ED Diagnoses   Final diagnoses:  Acute on chronic congestive heart failure, unspecified heart failure type [facility])  Aortic valve stenosis, etiology of cardiac valve disease unspecified    ED Discharge Orders    None       Jola Schmidt, MD 04/11/18 1826

## 2018-04-11 NOTE — Telephone Encounter (Signed)
Agree with proceeding to ER

## 2018-04-11 NOTE — Telephone Encounter (Signed)
Called patient at 12:23pm to check on his current status. He has been missed at [facility] since after 04/03/18. Patient shared that he had recent appointments and tests with Dr. Radford Pax and a planned follow-up this Friday on plan of care. He shared he almost went to ED yesterday. He is noticeably short of breath while speaking to him and he said it is difficult to talk. He said he was not certain if he can wait for the Friday appointment for a decision.  Inquired if he had any swelling in lower extremities and he said yes around his ankles. Given symptoms, advised patient to seek emergency care.  After phone call to patient called Dr. Theodosia Blender office at 12:34pm to report patient's symptoms, provided this information to Frederik Schmidt, RN who said he will follow-up with the patient.

## 2018-04-11 NOTE — ED Provider Notes (Signed)
Patient placed in Quick Look pathway, seen and evaluated   Chief Complaint: shortness of breath  HPI:   [name] is a 67 y.o. male who presents to the ED with shortness of breath. The shortness of breath has been off and on for the past year but has gotten worse since stress test last week. The symptoms are worse with lying flat. Patient denies chest pain.   ROS: Resp: shortness of breath, wheezing  Physical Exam:  BP 95/70   Pulse 89   Temp 98.1 F (36.7 C) (Oral)   Resp 20   Ht 5\' 11"  (1.803 m)   Wt 79.4 kg   SpO2 96%   BMI 24.41 kg/m   Gen: No distress  Neuro: Awake and Alert  Skin: Warm and dry  Lungs: rales bilateral, expiratory wheezing  Next available room  Initiation of care has begun. The patient has been counseled on the process, plan, and necessity for staying for the completion/evaluation, and the remainder of the medical screening examination    Ashley Murrain, NP 04/13/18 1353    Carmin Muskrat, MD 04/13/18 2031

## 2018-04-11 NOTE — ED Notes (Signed)
Ronalee Belts RN and Dr. Venora Maples notified of pt's low CBG

## 2018-04-12 ENCOUNTER — Encounter (HOSPITAL_COMMUNITY): Payer: Self-pay | Admitting: Physician Assistant

## 2018-04-12 ENCOUNTER — Encounter (HOSPITAL_COMMUNITY): Admission: RE | Admit: 2018-04-12 | Payer: Self-pay | Source: Ambulatory Visit

## 2018-04-12 ENCOUNTER — Other Ambulatory Visit: Payer: Self-pay

## 2018-04-12 DIAGNOSIS — E7800 Pure hypercholesterolemia, unspecified: Secondary | ICD-10-CM | POA: Diagnosis present

## 2018-04-12 DIAGNOSIS — Z794 Long term (current) use of insulin: Secondary | ICD-10-CM

## 2018-04-12 DIAGNOSIS — Z888 Allergy status to other drugs, medicaments and biological substances status: Secondary | ICD-10-CM | POA: Diagnosis not present

## 2018-04-12 DIAGNOSIS — Z87891 Personal history of nicotine dependence: Secondary | ICD-10-CM | POA: Diagnosis not present

## 2018-04-12 DIAGNOSIS — I509 Heart failure, unspecified: Secondary | ICD-10-CM

## 2018-04-12 DIAGNOSIS — Z923 Personal history of irradiation: Secondary | ICD-10-CM | POA: Diagnosis not present

## 2018-04-12 DIAGNOSIS — E119 Type 2 diabetes mellitus without complications: Secondary | ICD-10-CM

## 2018-04-12 DIAGNOSIS — I5043 Acute on chronic combined systolic (congestive) and diastolic (congestive) heart failure: Secondary | ICD-10-CM | POA: Diagnosis present

## 2018-04-12 DIAGNOSIS — I2510 Atherosclerotic heart disease of native coronary artery without angina pectoris: Secondary | ICD-10-CM | POA: Diagnosis present

## 2018-04-12 DIAGNOSIS — N182 Chronic kidney disease, stage 2 (mild): Secondary | ICD-10-CM | POA: Diagnosis present

## 2018-04-12 DIAGNOSIS — G4700 Insomnia, unspecified: Secondary | ICD-10-CM | POA: Diagnosis present

## 2018-04-12 DIAGNOSIS — I429 Cardiomyopathy, unspecified: Secondary | ICD-10-CM | POA: Diagnosis present

## 2018-04-12 DIAGNOSIS — I5023 Acute on chronic systolic (congestive) heart failure: Secondary | ICD-10-CM | POA: Diagnosis not present

## 2018-04-12 DIAGNOSIS — E1122 Type 2 diabetes mellitus with diabetic chronic kidney disease: Secondary | ICD-10-CM | POA: Diagnosis present

## 2018-04-12 DIAGNOSIS — E1140 Type 2 diabetes mellitus with diabetic neuropathy, unspecified: Secondary | ICD-10-CM | POA: Diagnosis present

## 2018-04-12 DIAGNOSIS — Z8673 Personal history of transient ischemic attack (TIA), and cerebral infarction without residual deficits: Secondary | ICD-10-CM | POA: Diagnosis not present

## 2018-04-12 DIAGNOSIS — Z833 Family history of diabetes mellitus: Secondary | ICD-10-CM | POA: Diagnosis not present

## 2018-04-12 DIAGNOSIS — Z91048 Other nonmedicinal substance allergy status: Secondary | ICD-10-CM | POA: Diagnosis not present

## 2018-04-12 DIAGNOSIS — Z9861 Coronary angioplasty status: Secondary | ICD-10-CM | POA: Diagnosis not present

## 2018-04-12 DIAGNOSIS — I2584 Coronary atherosclerosis due to calcified coronary lesion: Secondary | ICD-10-CM | POA: Diagnosis present

## 2018-04-12 DIAGNOSIS — E785 Hyperlipidemia, unspecified: Secondary | ICD-10-CM | POA: Diagnosis present

## 2018-04-12 DIAGNOSIS — I2582 Chronic total occlusion of coronary artery: Secondary | ICD-10-CM | POA: Diagnosis present

## 2018-04-12 DIAGNOSIS — Z86018 Personal history of other benign neoplasm: Secondary | ICD-10-CM | POA: Diagnosis not present

## 2018-04-12 DIAGNOSIS — I252 Old myocardial infarction: Secondary | ICD-10-CM | POA: Diagnosis not present

## 2018-04-12 DIAGNOSIS — Z951 Presence of aortocoronary bypass graft: Secondary | ICD-10-CM | POA: Diagnosis not present

## 2018-04-12 DIAGNOSIS — I130 Hypertensive heart and chronic kidney disease with heart failure and stage 1 through stage 4 chronic kidney disease, or unspecified chronic kidney disease: Secondary | ICD-10-CM | POA: Diagnosis present

## 2018-04-12 DIAGNOSIS — R0602 Shortness of breath: Secondary | ICD-10-CM | POA: Diagnosis not present

## 2018-04-12 DIAGNOSIS — Z8249 Family history of ischemic heart disease and other diseases of the circulatory system: Secondary | ICD-10-CM | POA: Diagnosis not present

## 2018-04-12 DIAGNOSIS — I350 Nonrheumatic aortic (valve) stenosis: Secondary | ICD-10-CM | POA: Diagnosis present

## 2018-04-12 LAB — LIPID PANEL
CHOLESTEROL: 95 mg/dL (ref 0–200)
HDL: 33 mg/dL — ABNORMAL LOW (ref >40)
LDL CALC: 55 mg/dL (ref 0–99)
TRIGLYCERIDES: 36 mg/dL (ref <150)
Total CHOL/HDL Ratio: 2.9 RATIO
VLDL: 7 mg/dL (ref 0–40)

## 2018-04-12 LAB — CBC
HCT: 46 % (ref 39.0–52.0)
HEMOGLOBIN: 15 g/dL (ref 13.0–17.0)
MCH: 26.9 pg (ref 26.0–34.0)
MCHC: 32.6 g/dL (ref 30.0–36.0)
MCV: 82.6 fL (ref 78.0–100.0)
PLATELETS: 254 10*3/uL (ref 150–400)
RBC: 5.57 MIL/uL (ref 4.22–5.81)
RDW: 14.9 % (ref 11.5–15.5)
WBC: 4.5 10*3/uL (ref 4.0–10.5)

## 2018-04-12 LAB — TROPONIN I: Troponin I: <0.03 ng/mL (ref <0.03)

## 2018-04-12 LAB — HIV ANTIBODY (ROUTINE TESTING W REFLEX): HIV Screen 4th Generation wRfx: NONREACTIVE

## 2018-04-12 LAB — BASIC METABOLIC PANEL
ANION GAP: 7 (ref 5–15)
BUN: 16 mg/dL (ref 8–23)
CALCIUM: 9.1 mg/dL (ref 8.9–10.3)
CO2: 25 mmol/L (ref 22–32)
CREATININE: 1.56 mg/dL — AB (ref 0.61–1.24)
Chloride: 111 mmol/L (ref 98–111)
GFR calc non Af Amer: 44 mL/min — ABNORMAL LOW (ref >60)
GFR, EST AFRICAN AMERICAN: 51 mL/min — AB (ref >60)
Glucose, Bld: 125 mg/dL — ABNORMAL HIGH (ref 70–99)
Potassium: 4.4 mmol/L (ref 3.5–5.1)
SODIUM: 143 mmol/L (ref 135–145)

## 2018-04-12 LAB — GLUCOSE, CAPILLARY
GLUCOSE-CAPILLARY: 182 mg/dL — AB (ref 70–99)
GLUCOSE-CAPILLARY: 174 mg/dL — AB (ref 70–99)
GLUCOSE-CAPILLARY: 108 mg/dL — AB (ref 70–99)
Glucose-Capillary: 168 mg/dL — ABNORMAL HIGH (ref 70–99)

## 2018-04-12 MED ORDER — SODIUM CHLORIDE 0.9% FLUSH
3.0000 mL | Freq: Two times a day (BID) | INTRAVENOUS | Status: DC
  Administered 2018-04-12: 3 mL via INTRAVENOUS

## 2018-04-12 MED ORDER — SODIUM CHLORIDE 0.9 % IV SOLN
INTRAVENOUS | Status: DC
  Administered 2018-04-13: 05:00:00 via INTRAVENOUS

## 2018-04-12 MED ORDER — ZOLPIDEM TARTRATE 5 MG PO TABS
5.0000 mg | ORAL_TABLET | Freq: Every evening | ORAL | Status: DC | PRN
  Administered 2018-04-12 – 2018-04-15 (×4): 5 mg via ORAL
  Filled 2018-04-12 (×4): qty 1

## 2018-04-12 MED ORDER — SODIUM CHLORIDE 0.9 % IV SOLN: 250.0000 mL | INTRAVENOUS | Status: DC | PRN

## 2018-04-12 MED ORDER — SODIUM CHLORIDE 0.9% FLUSH: 3.0000 mL | INTRAVENOUS | Status: DC | PRN

## 2018-04-12 MED ORDER — INSULIN DETEMIR 100 UNIT/ML ~~LOC~~ SOLN
15.0000 [IU] | Freq: Every day | SUBCUTANEOUS | Status: DC
  Administered 2018-04-12 – 2018-04-16 (×5): 15 [IU] via SUBCUTANEOUS
  Filled 2018-04-12 (×5): qty 0.15

## 2018-04-12 MED ORDER — AMITRIPTYLINE HCL 25 MG PO TABS
100.0000 mg | ORAL_TABLET | Freq: Every day | ORAL | Status: DC
  Administered 2018-04-12 – 2018-04-15 (×4): 100 mg via ORAL
  Filled 2018-04-12 (×4): qty 4

## 2018-04-12 NOTE — H&P (View-Only) (Signed)
[facility] VALVE TEAM  Inpatient TAVR Consultation:   Patient ID: KATO WIECZOREK; [id number]; 10-28-1950   Admit date: 04/11/2018 Date of Consult: 04/12/2018  Primary Care Provider: [name], MD Primary Cardiologist: Dr. Radford Pax    Patient Profile:   DEVAL MROCZKA is a 67 y.o. male with a hx of CVA, CAD s/p previous stenting and ultimately CABG x2 in 2014, HTN, HLD, DMT2, CKD stage II, chronic combined S/D CHF and mod-severe AS/AI who is being seen today for the evaluation of possible low flow, low gradient AS with AI and acute CHF at the request of Dr. Ellyn Hack.  History of Present Illness:   Mr. Behney lives in [place] with his wife who is a retired Therapist, sports.  He has 2 children and 2 grandchildren who live locally.  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  He has all of his natural teeth and regularly sees a dentist.  He is unaware of any active dental issues.  He has a history of a mediastinal seminoma resected 1985. He also had a history of CVA per his wife in 2012. He was diagnosed with a lacunar infarct at [facility] but has no residual deficits.  He had a non-STEMI in 2013 treated with PCI of the LCx/OM. LHC in 5/14 demonstrated severe 2 vessel CAD with critical in-stent restenosis in the LCx. He underwent CABG (SVG-PDA, SVG-LCx). He also has a history of aortic stenosis. Echo 10/2016 showed moderately reduced LVF at 35-40% with diffuse HK, mild AS and moderate AR.  R/LHC showed mild to moderate AS with AVA 0.837cm2 and EF 45% with patent SVG to RPDA and SVG to OM1 with occluded native vessels.  Medical therapy was continued    He was recently seen in the office by Dr. Radford Pax on 03/28/2018 and complained of worsening dyspnea.  His heart rate was noted to be elevated and a 48-hour Holter monitor was placed.  This showed sinus tachycardia: ave  rate 109 bpm soToprol was increased.  He was also set up for a Myoview and a repeat echocardiogram.  Myoview showed EF 30% with positive EKG but no ischemia on perfusion imaging.  Echocardiogram on 04/02/2018 showed EF 30 to 35%, akinesis of the anteroseptal myocardium, trileaflet severely thickened, calcified leaflets with at least moderate stenosis and mild regurgitation: mean gradient 20 mmHg, peak 34 mmHg, DVI 0.22, AVA 1.02 cm, mild dilation of ascending aorta, mild MR, mild TR and PA pressure 38 mmHg.   There was concern for progression of his aortic stenosis and structural heart consult was obtained.  He was scheduled for an office visit with Dr. Angelena Form on 04/13/18.  However, he called the office to report increasing dyspnea, weight gain, orthopnea, PND, and lower extremity edema and was directed to the ED.  In the [facility] ED his BNP was noted to be elevated at 957, creatinine 1.56, troponin negative x2, CBC normal, TSH 5.756.  He was admitted for IV diuresis and left and right heart catheterization which has been tentatively planned for tomorrow.  He is already feeling much better after 1 dose of IV Lasix.  He now feels comfortable at rest but still has shortness of breath when up walking around.  His lower extremity edema and orthopnea is improving.  He admits to noticing more fatigue and dyspnea on exertion when walking around  the parking lot at night.  He denies any chest pain, dizziness or syncope.   Past Medical History:  Diagnosis Date  . Aortic stenosis   . Cancer [facility])    mediastinal seminoma resected 1985 - benign  . Chronic kidney disease   . Coronary artery disease 02/2012   a. s/p stenting in 2013  b. s/p CABGx2V (SVG--> PDA, SVG--> LCx).  . Diabetes mellitus    neuropathy  insulin dependent  . Dyslipidemia   . Erectile dysfunction   . GERD (gastroesophageal reflux disease)   . Hypertension   . Stroke [facility])    TIA's    Past Surgical History:  Procedure  Laterality Date  . COLONOSCOPY  07/25/2012   Procedure: COLONOSCOPY;  Surgeon: Winfield Cunas., MD;  Location: [facility] ENDOSCOPY;  Service: Endoscopy;  Laterality: N/A;  . COLONOSCOPY N/A 12/02/2013   Procedure: COLONOSCOPY;  Surgeon: Winfield Cunas., MD;  Location: [facility] ENDOSCOPY;  Service: Endoscopy;  Laterality: N/A;  . CORONARY ARTERY BYPASS GRAFT N/A 01/04/2013   Procedure: CORONARY ARTERY BYPASS GRAFTING (CABG) times two using right saphenous vein harvested with endoscope.;  Surgeon: Ivin Poot, MD;  Location: [place];  Service: Open Heart Surgery;  Laterality: N/A;  . ESOPHAGOGASTRODUODENOSCOPY  07/20/2012   Procedure: ESOPHAGOGASTRODUODENOSCOPY (EGD);  Surgeon: Winfield Cunas., MD;  Location: [facility] ENDOSCOPY;  Service: Endoscopy;  Laterality: N/A;  . ESOPHAGOGASTRODUODENOSCOPY N/A 12/02/2013   Procedure: ESOPHAGOGASTRODUODENOSCOPY (EGD);  Surgeon: Winfield Cunas., MD;  Location: [facility] ENDOSCOPY;  Service: Endoscopy;  Laterality: N/A;  . HOT HEMOSTASIS N/A 12/02/2013   Procedure: HOT HEMOSTASIS (ARGON PLASMA COAGULATION/BICAP);  Surgeon: Winfield Cunas., MD;  Location: [facility] ENDOSCOPY;  Service: Endoscopy;  Laterality: N/A;  . INTRAOPERATIVE TRANSESOPHAGEAL ECHOCARDIOGRAM N/A 01/04/2013   Procedure: INTRAOPERATIVE TRANSESOPHAGEAL ECHOCARDIOGRAM;  Surgeon: Ivin Poot, MD;  Location: [place];  Service: Open Heart Surgery;  Laterality: N/A;  . LEFT HEART CATHETERIZATION WITH CORONARY ANGIOGRAM N/A 03/06/2012   Procedure: LEFT HEART CATHETERIZATION WITH CORONARY ANGIOGRAM;  Surgeon: [name], MD;  Location: [place] CATH LAB;  Service: Cardiovascular;  Laterality: N/A;  . lithotripsy    . RADIAL ARTERY HARVEST Left 01/04/2013   Procedure: RADIAL ARTERY HARVEST;  Surgeon: Ivin Poot, MD;  Location: [place];  Service: Vascular;  Laterality: Left;  Artery not havested. Unsuitable for use.  . resection mediastinal seminonma    . RIGHT/LEFT HEART CATH AND CORONARY/GRAFT ANGIOGRAPHY N/A  11/15/2016   Procedure: Right/Left Heart Cath and Coronary/Graft Angiography;  Surgeon: Leonie Man, MD;  Location: [place] CV LAB;  Service: Cardiovascular;  Laterality: N/A;     Inpatient Medications: Scheduled Meds: . amitriptyline  100 mg Oral QHS  . aspirin EC  81 mg Oral Daily  . furosemide  40 mg Intravenous Once  . furosemide  40 mg Intravenous Daily  . heparin  5,000 Units Subcutaneous Q8H  . insulin aspart  0-15 Units Subcutaneous TID WC  . insulin detemir  15 Units Subcutaneous Daily  . levothyroxine  50 mcg Oral QAC breakfast  . metoprolol succinate  100 mg Oral QHS  . simvastatin  40 mg Oral QHS   Continuous Infusions:  PRN Meds: acetaminophen, nitroGLYCERIN, ondansetron (ZOFRAN) IV  Allergies:    Allergies  Allergen Reactions  . Lipitor [Atorvastatin] Other (See Comments)    Muscle pain  . Adhesive [Tape] Rash    Social History:   Social History   Socioeconomic History  . Marital status: Married  Spouse name: Not on file  . Number of children: Not on file  . Years of education: Not on file  . Highest education level: Not on file  Occupational History  . Not on file  Social Needs  . Financial resource strain: Not on file  . Food insecurity:    Worry: Not on file    Inability: Not on file  . Transportation needs:    Medical: Not on file    Non-medical: Not on file  Tobacco Use  . Smoking status: Former Smoker    Last attempt to quit: 03/06/1985    Years since quitting: 33.1  . Smokeless tobacco: Never Used  Substance and Sexual Activity  . Alcohol use: No  . Drug use: No  . Sexual activity: Not Currently  Lifestyle  . Physical activity:    Days per week: Not on file    Minutes per session: Not on file  . Stress: Not on file  Relationships  . Social connections:    Talks on phone: Not on file    Gets together: Not on file    Attends religious service: Not on file    Active member of club or organization: Not on file    Attends  meetings of clubs or organizations: Not on file    Relationship status: Not on file  . Intimate partner violence:    Fear of current or ex partner: Not on file    Emotionally abused: Not on file    Physically abused: Not on file    Forced sexual activity: Not on file  Other Topics Concern  . Not on file  Social History Narrative  . Not on file    Family History:   The patient's family history includes Cancer in his brother; Diabetes in his father, mother, and sister; Heart failure in his mother; Hypertension in his mother.  ROS:  Please see the history of present illness.  ROS  All other ROS reviewed and negative.     Physical Exam/Data:   Vitals:   04/12/18 0021 04/12/18 0518 04/12/18 0529 04/12/18 1358  BP:  114/83  112/71  Pulse:   95 98  Resp: 16  (!) 29 (!) 29  Temp:   (!) 97.5 F (36.4 C) 97.9 F (36.6 C)  TempSrc:   Oral Oral  SpO2: 97%   97%  Weight:  80.1 kg    Height:        Intake/Output Summary (Last 24 hours) at 04/12/2018 1926 Last data filed at 04/12/2018 1712 Gross per 24 hour  Intake 580 ml  Output 1825 ml  Net -1245 ml   Filed Weights   04/11/18 1429 04/11/18 2021 04/12/18 0518  Weight: 79.4 kg 81 kg 80.1 kg   Body mass index is 24.63 kg/m.  General:  Well nourished, well developed, in no acute distress HEENT: normal Lymph: no adenopathy Neck: +JVD Endocrine:  No thryomegaly Vascular: No carotid bruits; FA pulses 2+ bilaterally without bruits  Cardiac:  normal S1, S2; RRR; +2/6 SEM @ RUSB Lungs: Crackles at bases Abd: soft, nontender, no hepatomegaly  Ext: 1+ bilateral lower extremity edema Musculoskeletal:  No deformities, BUE and BLE strength normal and equal Skin: warm and dry  Neuro:  CNs 2-12 intact, no focal abnormalities noted Psych:  Normal affect   EKG:  The EKG was personally reviewed and demonstrates: Sinus Telemetry:  Telemetry was personally reviewed and demonstrates: Sinus  Relevant CV Studies: 2D ECHO 04/02/18 Study  Conclusions - Left  ventricle: The cavity size was normal. There was mild focal   basal hypertrophy of the septum. Systolic function was moderately   to severely reduced. The estimated ejection fraction was in the   range of 30% to 35%. There is akinesis of the anteroseptal   myocardium. There was no evidence of elevated ventricular filling   pressure by Doppler parameters. - Aortic valve: Moderately calcified annulus. Trileaflet; severely   thickened, severely calcified leaflets. Valve mobility was   restricted. There was moderate stenosis. There was mild   regurgitation. Peak velocity (S): 293 cm/s. Mean gradient (S): 20   mm Hg. - Aorta: Mild advancement in aortic stenosis (prior mean gradient   54mmHg). Aortic root dimension: 38 mm (ED). - Ascending aorta: The ascending aorta was mildly dilated. - Mitral valve: There was mild regurgitation. - Left atrium: The atrium was moderately dilated. - Tricuspid valve: There was mild regurgitation. - Pulmonary arteries: Systolic pressure was mildly increased. PA   peak pressure: 38 mm Hg (S).    Laboratory Data:  Chemistry Recent Labs  Lab 04/11/18 1431 04/11/18 2027 04/12/18 1007  NA 140 142 143  K 4.7 4.0 4.4  CL 108 110 111  CO2 24 22 25   GLUCOSE 75 135* 125*  BUN 24* 19 16  CREATININE 1.54* 1.52* 1.56*  CALCIUM 9.1 8.7* 9.1  GFRNONAA 45* 45* 44*  GFRAA 52* 53* 51*  ANIONGAP 8 10 7     No results for input(s): PROT, ALBUMIN, AST, ALT, ALKPHOS, BILITOT in the last 168 hours. Hematology Recent Labs  Lab 04/11/18 1431 04/11/18 2027 04/12/18 1007  WBC 4.0 4.5 4.5  RBC 5.81 5.35 5.57  HGB 15.6 14.4 15.0  HCT 47.9 44.2 46.0  MCV 82.4 82.6 82.6  MCH 26.9 26.9 26.9  MCHC 32.6 32.6 32.6  RDW 15.0 15.0 14.9  PLT 238 264 254   Cardiac Enzymes Recent Labs  Lab 04/11/18 2027 04/12/18 0216  TROPONINI <0.03 <0.03    Recent Labs  Lab 04/11/18 1517  TROPIPOC 0.01    BNP Recent Labs  Lab 04/11/18 1432 04/11/18 2035   BNP 957.5* 682.8*    DDimer No results for input(s): DDIMER in the last 168 hours.  Radiology/Studies:  Dg Chest 2 View  Result Date: 04/11/2018 CLINICAL DATA:  Onset of chest pain this morning while sitting position, worsened while lying supine: Dry cough, history coronary artery disease post MI and CABG, abdominal aortic aneurysm, seminoma, diabetes mellitus, hypertension, former smoker EXAM: CHEST - 2 VIEW COMPARISON:  03/20/2013 FINDINGS: Upper normal size of cardiac silhouette post CABG. Atherosclerotic calcifications aorta and coronary arteries. Mediastinal contours and pulmonary vascularity normal. Mild bibasilar atelectasis versus infiltrate and probable small bibasilar effusions. Remaining lungs clear. No acute infiltrate or pneumothorax. Question 5 mm nodular density RIGHT upper lobe. IMPRESSION: Bibasilar atelectasis versus infiltrate and probable small pleural effusions. Question new 5 mm RIGHT upper lobe nodule; CT chest recommended to exclude pulmonary nodule. Electronically Signed   By: Lavonia Dana M.D.   On: 04/11/2018 15:50     STS Risk Calculator:  Procedure: Isolated AVR  Risk of Mortality:  3.167%   Renal Failure:  4.535%   Permanent Stroke:  4.380%   Prolonged Ventilation:  22.917%   DSW Infection:  0.271%   Reoperation:  6.183%   Morbidity or Mortality:  33.316%   Short Length of Stay:  25.339%   Long Length of Stay:  12.397%    Procedure: AVR + CAB  Risk of Mortality:  6.395%  Renal Failure:  4.775%   Permanent Stroke:  1.853%   Prolonged Ventilation:  17.348%   DSW Infection:  0.268%   Reoperation:  4.940%   Morbidity or Mortality:  25.375%   Short Length of Stay:  15.543%   Long Length of Stay:  11.350%     Assessment and Plan:   MASTON WIGHT is a 67 y.o. male with symptoms of severe, stage D2 aortic stenosis with NYHA Class IV symptoms. I have reviewed the patient's recent echocardiogram which is notable for reduced LV systolic function and  possible low flow, right low gradient aortic stenosis with peak gradient of 34 mm hg and mean transvalvular gradient of 20 mm Hg. The patient's dimensionless index is 0.22 and calculated aortic valve area is 0.51 cm.   I have reviewed the natural history of aortic stenosis with the patient. We have discussed the limitations of medical therapy and the poor prognosis associated with symptomatic aortic stenosis. We have reviewed potential treatment options, including palliative medical therapy, conventional surgical aortic valve replacement, and transcatheter aortic valve replacement. We discussed treatment options in the context of this patient's specific comorbid medical conditions.   The patient's predicted risk of mortality with conventional aortic valve replacement is 3.167% primarily based on the patient's previous history of two open operations, CAD S/P CABG, LV dysfunction, acute CHF, CKD stage II, previous CVA, and hypertension. TAVR seems like a reasonable treatment option for this patient pending formal cardiac surgical consultation. We discussed typical evaluation which will require a gated cardiac CTA and a CTA of the chest/abdomen/pelvis to evaluate both his cardiac anatomy and peripheral vasculature, L/RHC, PFTs, carotid dopplers, and PT consult. Plan is for left and right heart cath tomorrow with Dr. Claiborne Billings.  If this confirms severe low gradient low flow aortic stenosis we will pursue further TAVR work-up and arrange other follow-up and surgical consultation in the outpatient setting. He will require at least one more day of IV diuresis.    Signed, Angelena Form, PA-C 04/12/18  I have personally seen and examined this patient. I agree with the assessment and plan as outlined above.  Mr. [name] is a 67 yo male with what appears to be severe aortic stenosis. This likely low flow/low gradient AS given his LV systolic dysfunction. He also has CAD s/p CABG, DM, HTN, HLD, prior CVA, chronic  kidney disease, chronic systolic and diastolic CHF and prior resection of benign mediastinal seminoma in 1985. 2 Vessel CABG in 2014. He has been followed by Dr. Radford Pax for aortic stenosis. He was seen by her 2 weeks ago with worsened dyspnea. Echo with LVEF=30-35%, akinesis of the anteroseptal myocardium. His aortic valve leaflets are calcified and thickened with limited mobility. Mean gradient 20 mmHg, peak gradient 34 mmHg, DVI 0.22. AVA 1.0 cm2. This is c/w severe aortic stenosis with low gradients due to his low LVEF. I was supposed to the see him in the office tomorrow to discuss his AS and possible TAVR but he was admitted yesterday to [facility] with acute CHF. He has been diuresed and is feeling much better. Still with some dyspnea with exertion.  Labs reviewed by me.  Echo images reviewed by me. His valve leaflets have limited excursion and are heavily thickened and calcified.  EKG reviewed by me. Sinus My exam:   General: Well developed, well nourished, NAD  HEENT: OP clear, mucus membranes moist  SKIN: warm, dry. No rashes. Neuro: No focal deficits  Musculoskeletal: Muscle strength 5/5 all ext  Psychiatric: Mood and affect normal  Neck: No JVD, no carotid bruits, no thyromegaly, no lymphadenopathy.  Lungs:Clear bilaterally, no wheezes, rhonci, crackles Cardiovascular: Regular rate and rhythm. Systolic murmur noted at RUSB.   Abdomen:Soft. Bowel sounds present. Non-tender.  Extremities: No lower extremity edema. Pulses are 2 + in the bilateral DP/PT.  1. Severe aortic stenosis: He has severe, stage D aortic valve stenosis. Given his LV systolic dysfunction, this likely represents low flow/low gradient AS. I have personally reviewed the echo images. The aortic valve is thickened, calcified with limited leaflet mobility. I think he would benefit from AVR. With two prior open chest procedures, he is not a good candidate for conventional AVR by surgical approach. I think he may be a good candidate  for TAVR. I have reviewed the natural history of aortic stenosis with the patient. We have discussed the limitations of medical therapy and the poor prognosis associated with symptomatic aortic stenosis. We have reviewed potential treatment options, including palliative medical therapy, conventional surgical aortic valve replacement, and transcatheter aortic valve replacement. We discussed treatment options in the context of the patient's specific comorbid medical conditions. He would like to proceed with planning for TAVR. He will have a right and left heart cath tomorrow. We will then arrange his cardiac CT, CTA of the chest/abdomen/pelvis (staging based on renal function) PFTs, carotid dopplers and PT assessment. He will also be see by one of the CT surgeons on our TAVR team.   We will follow with you. I did not get to see him until 7pm tonight. His wife was not here. I will come by in the morning tomorrow to speak to both of them.   Lauree Chandler 04/12/2018 7:26 PM

## 2018-04-12 NOTE — Progress Notes (Signed)
Inpatient Diabetes Program Recommendations  AACE/ADA: New Consensus Statement on Inpatient Glycemic Control (2015)  Target Ranges:  Prepandial:   less than 140 mg/dL      Peak postprandial:   less than 180 mg/dL (1-2 hours)      Critically ill patients:  140 - 180 mg/dL   Lab Results  Component Value Date   GLUCAP 182 (H) 04/12/2018   HGBA1C 7.6 (H) 04/11/2018    Review of Glycemic Control Results for PRUITT, TABOADA (MRN [id number]) as of 04/12/2018 15:16  Ref. Range 04/11/2018 19:53 04/11/2018 22:04 04/12/2018 07:38 04/12/2018 11:07  Glucose-Capillary Latest Ref Range: 70 - 99 mg/dL 142 (H) 215 (H) 168 (H) 182 (H)   Diabetes history: Type 2 DM Outpatient Diabetes medications: Humalog via Omnipod, Metformin 1000 mg BID, Levemir 30 units QAM Current orders for Inpatient glycemic control: Novolog 0-15 units TID, Levemir 15 units QD  Inpatient Diabetes Program Recommendations:    Spoke with patient regarding outpatient diabetes management. Patient is seen by Dr Chalmers Cater, endocrinologist and has an Omnipod using Humalog that was initiated 3 weeks ago. Verified that patient was taking Levemir and Metformin in addition to pump placement.   Patient has insulin pump in the room with him but it is not connected nor does it have an insulin reservoir in it at this time. Patient states that he would prefer to use sub Q insulin while inpatient and he does not have additional insulin pump supplies with him. His wife will need to bring his insulin pump supplies here to the hospital so he can resume his pump at time of discharge.   Current insulin pump settings are as follows:  Basal insulin  12A 0.8 units/hour Total daily basal insulin: 19.2 units/24 hours Carb Coverage 1:11 1 unit for every 11 grams of carbohydrates Insulin Sensitivity 1:40 1 unit drops blood glucose 40 mg/dl Target Glucose Goals 12A-12P 70-120 mg/dl   In talking with the patient he states that his blood glucose normally runs very  good and his last A1C was 7.4%, however, recently had began having lower blood sugars. Patient is aware of interventions and claims this has occurred several times in the last week. In talking with the patient, there seems to be some confusion surrounding the administration of the Levemir. Per patient, "The pump trainer told me not to take the Levemir, but then Dr Almetta Lovely office called and told me that I needed it, so I did what they said." Discussed with patient that usually the pump manages basal needs, role of Levemir vs. Insulin pump, and discussed survival skills to include hypoglycemia .  At 1500: Dr Almetta Lovely office called regarding patient. Verified correct insulin pump settings and to see if Levemir was ordered. BS given during this hospitalization as well as administered insulin. CMA to give information to Dr Chalmers Cater for adjustments. Per CMA, it may be on 8/30.  Patient verbalized understanding of information discussed and states that he does not have any further questions related to diabetes at this time. Will continue to follow for safety administration at discharge.   Thanks, Bronson Curb, MSN, RNC-OB Diabetes Coordinator [phone] (8a-5p)

## 2018-04-12 NOTE — Progress Notes (Signed)
Patient had episode of sob around midnight and was placed on O2-2L.  Had another episode of sob tonight, checked lungs are diminished, some fine crackles on the right side.  I will keep monitoring the patient.

## 2018-04-12 NOTE — Progress Notes (Signed)
Progress Note  Patient Name: [name] Date of Encounter: 04/12/2018  Primary Cardiologist: No primary care provider on file.  Subjective   Feeling short of breath this morning while at rest. No chest pain.   Inpatient Medications    Scheduled Meds: . aspirin EC  81 mg Oral Daily  . furosemide  40 mg Intravenous Once  . furosemide  40 mg Intravenous Daily  . heparin  5,000 Units Subcutaneous Q8H  . insulin aspart  0-15 Units Subcutaneous TID WC  . levothyroxine  50 mcg Oral QAC breakfast  . metoprolol succinate  100 mg Oral QHS  . simvastatin  40 mg Oral QHS   Continuous Infusions:  PRN Meds: acetaminophen, nitroGLYCERIN, ondansetron (ZOFRAN) IV   Vital Signs    Vitals:   04/12/18 0006 04/12/18 0021 04/12/18 0518 04/12/18 0529  BP: (!) 122/97  114/83   Pulse: (!) 109   95  Resp:  16  (!) 29  Temp:    (!) 97.5 F (36.4 C)  TempSrc:    Oral  SpO2:  97%    Weight:   80.1 kg   Height:        Intake/Output Summary (Last 24 hours) at 04/12/2018 1003 Last data filed at 04/12/2018 9233 Gross per 24 hour  Intake -  Output 300 ml  Net -300 ml   Filed Weights   04/11/18 1429 04/11/18 2021 04/12/18 0518  Weight: 79.4 kg 81 kg 80.1 kg    Telemetry    SR - Personally Reviewed  ECG    SR - Personally Reviewed  Physical Exam   General: Well developed, well nourished, older AA male appearing in no acute distress. Head: Normocephalic, atraumatic.  Neck: Supple, + JVD. Lungs:  Resp regular and unlabored, CTA. Heart: RRR, S1, S2, 3/5 systolic murmur; no rub. Abdomen: Soft, non-tender, non-distended with normoactive bowel sounds. Extremities: No clubbing, cyanosis, edema. Distal pedal pulses are 2+ bilaterally. Neuro: Alert and oriented X 3. Moves all extremities spontaneously. Psych: Normal affect.  Labs    Chemistry Recent Labs  Lab 04/11/18 1431 04/11/18 2027  NA 140 142  K 4.7 4.0  CL 108 110  CO2 24 22  GLUCOSE 75 135*  BUN 24* 19    CREATININE 1.54* 1.52*  CALCIUM 9.1 8.7*  GFRNONAA 45* 45*  GFRAA 52* 53*  ANIONGAP 8 10     Hematology Recent Labs  Lab 04/11/18 1431 04/11/18 2027  WBC 4.0 4.5  RBC 5.81 5.35  HGB 15.6 14.4  HCT 47.9 44.2  MCV 82.4 82.6  MCH 26.9 26.9  MCHC 32.6 32.6  RDW 15.0 15.0  PLT 238 264    Cardiac Enzymes Recent Labs  Lab 04/11/18 2027 04/12/18 0216  TROPONINI <0.03 <0.03    Recent Labs  Lab 04/11/18 1517  TROPIPOC 0.01     BNP Recent Labs  Lab 04/11/18 1432 04/11/18 2035  BNP 957.5* 682.8*     DDimer No results for input(s): DDIMER in the last 168 hours.    Radiology    Dg Chest 2 View  Result Date: 04/11/2018 CLINICAL DATA:  Onset of chest pain this morning while sitting position, worsened while lying supine: Dry cough, history coronary artery disease post MI and CABG, abdominal aortic aneurysm, seminoma, diabetes mellitus, hypertension, former smoker EXAM: CHEST - 2 VIEW COMPARISON:  03/20/2013 FINDINGS: Upper normal size of cardiac silhouette post CABG. Atherosclerotic calcifications aorta and coronary arteries. Mediastinal contours and pulmonary vascularity normal. Mild bibasilar atelectasis versus  infiltrate and probable small bibasilar effusions. Remaining lungs clear. No acute infiltrate or pneumothorax. Question 5 mm nodular density RIGHT upper lobe. IMPRESSION: Bibasilar atelectasis versus infiltrate and probable small pleural effusions. Question new 5 mm RIGHT upper lobe nodule; CT chest recommended to exclude pulmonary nodule. Electronically Signed   By: Lavonia Dana M.D.   On: 04/11/2018 15:50    Cardiac Studies   TTE: 04/02/18  Study Conclusions  - Left ventricle: The cavity size was normal. There was mild focal   basal hypertrophy of the septum. Systolic function was moderately   to severely reduced. The estimated ejection fraction was in the   range of 30% to 35%. There is akinesis of the anteroseptal   myocardium. There was no evidence of  elevated ventricular filling   pressure by Doppler parameters. - Aortic valve: Moderately calcified annulus. Trileaflet; severely   thickened, severely calcified leaflets. Valve mobility was   restricted. There was moderate stenosis. There was mild   regurgitation. Peak velocity (S): 293 cm/s. Mean gradient (S): 20   mm Hg. - Aorta: Mild advancement in aortic stenosis (prior mean gradient   44mmHg). Aortic root dimension: 38 mm (ED). - Ascending aorta: The ascending aorta was mildly dilated. - Mitral valve: There was mild regurgitation. - Left atrium: The atrium was moderately dilated. - Tricuspid valve: There was mild regurgitation. - Pulmonary arteries: Systolic pressure was mildly increased. PA   peak pressure: 38 mm Hg (S).  Patient Profile     67 y.o. male with a history of CABG, CRF and AV disease who presented with worsening shortness of breath. Admitted for further work up.   Assessment & Plan    1. Acute on chronic HF: Presented with worsening shortness of breath. EF slightly down from 35-40% to 30-35% back on 04/02/18. Recent myoview with non ischemic with concern for worsening valve disease. Suspect decompensation 2/2 to worsening AV. Ordered for lasix in the ED but does not appear to have gotten this. -- continue with IV lasix now as he is notable dyspnea at rest. Tentative L/RHC in the am pending renal function and respiratory status   2. AV disease: Being followed by Dr. Radford Pax as an outpatient. Further work up to determine Surgical vs TAVR?   3. DM: hold metformin, SSI, and levemir  -- Hgb A1c 7.6  4. HL: on statin, LDL 55  5. HTN: continue on Toprol  Signed, Reino Bellis, NP  04/12/2018, 10:03 AM  Pager # [phone]   For questions or updates, please contact [place] Please consult [email or web address] for contact info under Cardiology/STEMI.

## 2018-04-12 NOTE — Consult Note (Addendum)
[facility] VALVE TEAM  Inpatient TAVR Consultation:   Patient ID: [name]; [id number]; [birth year]   Admit date: 04/11/2018 Date of Consult: 04/12/2018  Primary Care Provider: [name], MD Primary Cardiologist: Dr. Radford Pax    Patient Profile:   [name] is a 67 y.o. male with a hx of CVA, CAD s/p previous stenting and ultimately CABG x2 in 2014, HTN, HLD, DMT2, CKD stage II, chronic combined S/D CHF and mod-severe AS/AI who is being seen today for the evaluation of possible low flow, low gradient AS with AI and acute CHF at the request of Dr. Ellyn Hack.  History of Present Illness:   [name] lives in [place] with his wife who is a retired Therapist, sports.  He has 2 children and 2 grandchildren who live locally.  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  He has all of his natural teeth and regularly sees a dentist.  He is unaware of any active dental issues.  He has a history of a mediastinal seminoma resected 1985. He also had a history of CVA per his wife in 2012. He was diagnosed with a lacunar infarct at [facility] but has no residual deficits.  He had a non-STEMI in 2013 treated with PCI of the LCx/OM. LHC in 5/14 demonstrated severe 2 vessel CAD with critical in-stent restenosis in the LCx. He underwent CABG (SVG-PDA, SVG-LCx). He also has a history of aortic stenosis. Echo 10/2016 showed moderately reduced LVF at 35-40% with diffuse HK, mild AS and moderate AR.  R/LHC showed mild to moderate AS with AVA 0.837cm2 and EF 45% with patent SVG to RPDA and SVG to OM1 with occluded native vessels.  Medical therapy was continued    He was recently seen in the office by Dr. Radford Pax on 03/28/2018 and complained of worsening dyspnea.  His heart rate was noted to be elevated and a 48-hour Holter monitor was placed.  This showed sinus tachycardia: ave  rate 109 bpm soToprol was increased.  He was also set up for a Myoview and a repeat echocardiogram.  Myoview showed EF 30% with positive EKG but no ischemia on perfusion imaging.  Echocardiogram on 04/02/2018 showed EF 30 to 35%, akinesis of the anteroseptal myocardium, trileaflet severely thickened, calcified leaflets with at least moderate stenosis and mild regurgitation: mean gradient 20 mmHg, peak 34 mmHg, DVI 0.22, AVA 1.02 cm, mild dilation of ascending aorta, mild MR, mild TR and PA pressure 38 mmHg.   There was concern for progression of his aortic stenosis and structural heart consult was obtained.  He was scheduled for an office visit with Dr. Angelena Form on 04/13/18.  However, he called the office to report increasing dyspnea, weight gain, orthopnea, PND, and lower extremity edema and was directed to the ED.  In the [facility] ED his BNP was noted to be elevated at 957, creatinine 1.56, troponin negative x2, CBC normal, TSH 5.756.  He was admitted for IV diuresis and left and right heart catheterization which has been tentatively planned for tomorrow.  He is already feeling much better after 1 dose of IV Lasix.  He now feels comfortable at rest but still has shortness of breath when up walking around.  His lower extremity edema and orthopnea is improving.  He admits to noticing more fatigue and dyspnea on exertion when walking around  the parking lot at night.  He denies any chest pain, dizziness or syncope.   Past Medical History:  Diagnosis Date  . Aortic stenosis   . Cancer [facility])    mediastinal seminoma resected 1985 - benign  . Chronic kidney disease   . Coronary artery disease 02/2012   a. s/p stenting in 2013  b. s/p CABGx2V (SVG--> PDA, SVG--> LCx).  . Diabetes mellitus    neuropathy  insulin dependent  . Dyslipidemia   . Erectile dysfunction   . GERD (gastroesophageal reflux disease)   . Hypertension   . Stroke [facility])    TIA's    Past Surgical History:  Procedure  Laterality Date  . COLONOSCOPY  07/25/2012   Procedure: COLONOSCOPY;  Surgeon: Winfield Cunas., MD;  Location: [facility] ENDOSCOPY;  Service: Endoscopy;  Laterality: N/A;  . COLONOSCOPY N/A 12/02/2013   Procedure: COLONOSCOPY;  Surgeon: Winfield Cunas., MD;  Location: [facility] ENDOSCOPY;  Service: Endoscopy;  Laterality: N/A;  . CORONARY ARTERY BYPASS GRAFT N/A 01/04/2013   Procedure: CORONARY ARTERY BYPASS GRAFTING (CABG) times two using right saphenous vein harvested with endoscope.;  Surgeon: Ivin Poot, MD;  Location: [place];  Service: Open Heart Surgery;  Laterality: N/A;  . ESOPHAGOGASTRODUODENOSCOPY  07/20/2012   Procedure: ESOPHAGOGASTRODUODENOSCOPY (EGD);  Surgeon: Winfield Cunas., MD;  Location: [facility] ENDOSCOPY;  Service: Endoscopy;  Laterality: N/A;  . ESOPHAGOGASTRODUODENOSCOPY N/A 12/02/2013   Procedure: ESOPHAGOGASTRODUODENOSCOPY (EGD);  Surgeon: Winfield Cunas., MD;  Location: [facility] ENDOSCOPY;  Service: Endoscopy;  Laterality: N/A;  . HOT HEMOSTASIS N/A 12/02/2013   Procedure: HOT HEMOSTASIS (ARGON PLASMA COAGULATION/BICAP);  Surgeon: Winfield Cunas., MD;  Location: [facility] ENDOSCOPY;  Service: Endoscopy;  Laterality: N/A;  . INTRAOPERATIVE TRANSESOPHAGEAL ECHOCARDIOGRAM N/A 01/04/2013   Procedure: INTRAOPERATIVE TRANSESOPHAGEAL ECHOCARDIOGRAM;  Surgeon: Ivin Poot, MD;  Location: [place];  Service: Open Heart Surgery;  Laterality: N/A;  . LEFT HEART CATHETERIZATION WITH CORONARY ANGIOGRAM N/A 03/06/2012   Procedure: LEFT HEART CATHETERIZATION WITH CORONARY ANGIOGRAM;  Surgeon: [name], MD;  Location: [place] CATH LAB;  Service: Cardiovascular;  Laterality: N/A;  . lithotripsy    . RADIAL ARTERY HARVEST Left 01/04/2013   Procedure: RADIAL ARTERY HARVEST;  Surgeon: Ivin Poot, MD;  Location: [place];  Service: Vascular;  Laterality: Left;  Artery not havested. Unsuitable for use.  . resection mediastinal seminonma    . RIGHT/LEFT HEART CATH AND CORONARY/GRAFT ANGIOGRAPHY N/A  11/15/2016   Procedure: Right/Left Heart Cath and Coronary/Graft Angiography;  Surgeon: Leonie Man, MD;  Location: [place] CV LAB;  Service: Cardiovascular;  Laterality: N/A;     Inpatient Medications: Scheduled Meds: . amitriptyline  100 mg Oral QHS  . aspirin EC  81 mg Oral Daily  . furosemide  40 mg Intravenous Once  . furosemide  40 mg Intravenous Daily  . heparin  5,000 Units Subcutaneous Q8H  . insulin aspart  0-15 Units Subcutaneous TID WC  . insulin detemir  15 Units Subcutaneous Daily  . levothyroxine  50 mcg Oral QAC breakfast  . metoprolol succinate  100 mg Oral QHS  . simvastatin  40 mg Oral QHS   Continuous Infusions:  PRN Meds: acetaminophen, nitroGLYCERIN, ondansetron (ZOFRAN) IV  Allergies:    Allergies  Allergen Reactions  . Lipitor [Atorvastatin] Other (See Comments)    Muscle pain  . Adhesive [Tape] Rash    Social History:   Social History   Socioeconomic History  . Marital status: Married  Spouse name: Not on file  . Number of children: Not on file  . Years of education: Not on file  . Highest education level: Not on file  Occupational History  . Not on file  Social Needs  . Financial resource strain: Not on file  . Food insecurity:    Worry: Not on file    Inability: Not on file  . Transportation needs:    Medical: Not on file    Non-medical: Not on file  Tobacco Use  . Smoking status: Former Smoker    Last attempt to quit: 03/06/1985    Years since quitting: 33.1  . Smokeless tobacco: Never Used  Substance and Sexual Activity  . Alcohol use: No  . Drug use: No  . Sexual activity: Not Currently  Lifestyle  . Physical activity:    Days per week: Not on file    Minutes per session: Not on file  . Stress: Not on file  Relationships  . Social connections:    Talks on phone: Not on file    Gets together: Not on file    Attends religious service: Not on file    Active member of club or organization: Not on file    Attends  meetings of clubs or organizations: Not on file    Relationship status: Not on file  . Intimate partner violence:    Fear of current or ex partner: Not on file    Emotionally abused: Not on file    Physically abused: Not on file    Forced sexual activity: Not on file  Other Topics Concern  . Not on file  Social History Narrative  . Not on file    Family History:   The patient's family history includes Cancer in his brother; Diabetes in his father, mother, and sister; Heart failure in his mother; Hypertension in his mother.  ROS:  Please see the history of present illness.  ROS  All other ROS reviewed and negative.     Physical Exam/Data:   Vitals:   04/12/18 0021 04/12/18 0518 04/12/18 0529 04/12/18 1358  BP:  114/83  112/71  Pulse:   95 98  Resp: 16  (!) 29 (!) 29  Temp:   (!) 97.5 F (36.4 C) 97.9 F (36.6 C)  TempSrc:   Oral Oral  SpO2: 97%   97%  Weight:  80.1 kg    Height:        Intake/Output Summary (Last 24 hours) at 04/12/2018 1926 Last data filed at 04/12/2018 1712 Gross per 24 hour  Intake 580 ml  Output 1825 ml  Net -1245 ml   Filed Weights   04/11/18 1429 04/11/18 2021 04/12/18 0518  Weight: 79.4 kg 81 kg 80.1 kg   Body mass index is 24.63 kg/m.  General:  Well nourished, well developed, in no acute distress HEENT: normal Lymph: no adenopathy Neck: +JVD Endocrine:  No thryomegaly Vascular: No carotid bruits; FA pulses 2+ bilaterally without bruits  Cardiac:  normal S1, S2; RRR; +2/6 SEM @ RUSB Lungs: Crackles at bases Abd: soft, nontender, no hepatomegaly  Ext: 1+ bilateral lower extremity edema Musculoskeletal:  No deformities, BUE and BLE strength normal and equal Skin: warm and dry  Neuro:  CNs 2-12 intact, no focal abnormalities noted Psych:  Normal affect   EKG:  The EKG was personally reviewed and demonstrates: Sinus Telemetry:  Telemetry was personally reviewed and demonstrates: Sinus  Relevant CV Studies: 2D ECHO 04/02/18 Study  Conclusions - Left  ventricle: The cavity size was normal. There was mild focal   basal hypertrophy of the septum. Systolic function was moderately   to severely reduced. The estimated ejection fraction was in the   range of 30% to 35%. There is akinesis of the anteroseptal   myocardium. There was no evidence of elevated ventricular filling   pressure by Doppler parameters. - Aortic valve: Moderately calcified annulus. Trileaflet; severely   thickened, severely calcified leaflets. Valve mobility was   restricted. There was moderate stenosis. There was mild   regurgitation. Peak velocity (S): 293 cm/s. Mean gradient (S): 20   mm Hg. - Aorta: Mild advancement in aortic stenosis (prior mean gradient   23mmHg). Aortic root dimension: 38 mm (ED). - Ascending aorta: The ascending aorta was mildly dilated. - Mitral valve: There was mild regurgitation. - Left atrium: The atrium was moderately dilated. - Tricuspid valve: There was mild regurgitation. - Pulmonary arteries: Systolic pressure was mildly increased. PA   peak pressure: 38 mm Hg (S).    Laboratory Data:  Chemistry Recent Labs  Lab 04/11/18 1431 04/11/18 2027 04/12/18 1007  NA 140 142 143  K 4.7 4.0 4.4  CL 108 110 111  CO2 24 22 25   GLUCOSE 75 135* 125*  BUN 24* 19 16  CREATININE 1.54* 1.52* 1.56*  CALCIUM 9.1 8.7* 9.1  GFRNONAA 45* 45* 44*  GFRAA 52* 53* 51*  ANIONGAP 8 10 7     No results for input(s): PROT, ALBUMIN, AST, ALT, ALKPHOS, BILITOT in the last 168 hours. Hematology Recent Labs  Lab 04/11/18 1431 04/11/18 2027 04/12/18 1007  WBC 4.0 4.5 4.5  RBC 5.81 5.35 5.57  HGB 15.6 14.4 15.0  HCT 47.9 44.2 46.0  MCV 82.4 82.6 82.6  MCH 26.9 26.9 26.9  MCHC 32.6 32.6 32.6  RDW 15.0 15.0 14.9  PLT 238 264 254   Cardiac Enzymes Recent Labs  Lab 04/11/18 2027 04/12/18 0216  TROPONINI <0.03 <0.03    Recent Labs  Lab 04/11/18 1517  TROPIPOC 0.01    BNP Recent Labs  Lab 04/11/18 1432 04/11/18 2035   BNP 957.5* 682.8*    DDimer No results for input(s): DDIMER in the last 168 hours.  Radiology/Studies:  Dg Chest 2 View  Result Date: 04/11/2018 CLINICAL DATA:  Onset of chest pain this morning while sitting position, worsened while lying supine: Dry cough, history coronary artery disease post MI and CABG, abdominal aortic aneurysm, seminoma, diabetes mellitus, hypertension, former smoker EXAM: CHEST - 2 VIEW COMPARISON:  03/20/2013 FINDINGS: Upper normal size of cardiac silhouette post CABG. Atherosclerotic calcifications aorta and coronary arteries. Mediastinal contours and pulmonary vascularity normal. Mild bibasilar atelectasis versus infiltrate and probable small bibasilar effusions. Remaining lungs clear. No acute infiltrate or pneumothorax. Question 5 mm nodular density RIGHT upper lobe. IMPRESSION: Bibasilar atelectasis versus infiltrate and probable small pleural effusions. Question new 5 mm RIGHT upper lobe nodule; CT chest recommended to exclude pulmonary nodule. Electronically Signed   By: Lavonia Dana M.D.   On: 04/11/2018 15:50     STS Risk Calculator:  Procedure: Isolated AVR  Risk of Mortality:  3.167%   Renal Failure:  4.535%   Permanent Stroke:  4.380%   Prolonged Ventilation:  22.917%   DSW Infection:  0.271%   Reoperation:  6.183%   Morbidity or Mortality:  33.316%   Short Length of Stay:  25.339%   Long Length of Stay:  12.397%    Procedure: AVR + CAB  Risk of Mortality:  6.395%  Renal Failure:  4.775%   Permanent Stroke:  1.853%   Prolonged Ventilation:  17.348%   DSW Infection:  0.268%   Reoperation:  4.940%   Morbidity or Mortality:  25.375%   Short Length of Stay:  15.543%   Long Length of Stay:  11.350%     Assessment and Plan:   [name] is a 67 y.o. male with symptoms of severe, stage D2 aortic stenosis with NYHA Class IV symptoms. I have reviewed the patient's recent echocardiogram which is notable for reduced LV systolic function and  possible low flow, right low gradient aortic stenosis with peak gradient of 34 mm hg and mean transvalvular gradient of 20 mm Hg. The patient's dimensionless index is 0.22 and calculated aortic valve area is 0.51 cm.   I have reviewed the natural history of aortic stenosis with the patient. We have discussed the limitations of medical therapy and the poor prognosis associated with symptomatic aortic stenosis. We have reviewed potential treatment options, including palliative medical therapy, conventional surgical aortic valve replacement, and transcatheter aortic valve replacement. We discussed treatment options in the context of this patient's specific comorbid medical conditions.   The patient's predicted risk of mortality with conventional aortic valve replacement is 3.167% primarily based on the patient's previous history of two open operations, CAD S/P CABG, LV dysfunction, acute CHF, CKD stage II, previous CVA, and hypertension. TAVR seems like a reasonable treatment option for this patient pending formal cardiac surgical consultation. We discussed typical evaluation which will require a gated cardiac CTA and a CTA of the chest/abdomen/pelvis to evaluate both his cardiac anatomy and peripheral vasculature, L/RHC, PFTs, carotid dopplers, and PT consult. Plan is for left and right heart cath tomorrow with Dr. Claiborne [facility].  If this confirms severe low gradient low flow aortic stenosis we will pursue further TAVR work-up and arrange other follow-up and surgical consultation in the outpatient setting. He will require at least one more day of IV diuresis.    Signed, Angelena Form, PA-C 04/12/18  I have personally seen and examined this patient. I agree with the assessment and plan as outlined above.  [name] is a 67 yo male with what appears to be severe aortic stenosis. This likely low flow/low gradient AS given his LV systolic dysfunction. He also has CAD s/p CABG, DM, HTN, HLD, prior CVA, chronic  kidney disease, chronic systolic and diastolic CHF and prior resection of benign mediastinal seminoma in 1985. 2 Vessel CABG in 2014. He has been followed by Dr. Radford Pax for aortic stenosis. He was seen by her 2 weeks ago with worsened dyspnea. Echo with LVEF=30-35%, akinesis of the anteroseptal myocardium. His aortic valve leaflets are calcified and thickened with limited mobility. Mean gradient 20 mmHg, peak gradient 34 mmHg, DVI 0.22. AVA 1.0 cm2. This is c/w severe aortic stenosis with low gradients due to his low LVEF. I was supposed to the see him in the office tomorrow to discuss his AS and possible TAVR but he was admitted yesterday to [facility] with acute CHF. He has been diuresed and is feeling much better. Still with some dyspnea with exertion.  Labs reviewed by me.  Echo images reviewed by me. His valve leaflets have limited excursion and are heavily thickened and calcified.  EKG reviewed by me. Sinus My exam:   General: Well developed, well nourished, NAD  HEENT: OP clear, mucus membranes moist  SKIN: warm, dry. No rashes. Neuro: No focal deficits  Musculoskeletal: Muscle strength 5/5 all ext  Psychiatric: Mood and affect normal  Neck: No JVD, no carotid bruits, no thyromegaly, no lymphadenopathy.  Lungs:Clear bilaterally, no wheezes, rhonci, crackles Cardiovascular: Regular rate and rhythm. Systolic murmur noted at RUSB.   Abdomen:Soft. Bowel sounds present. Non-tender.  Extremities: No lower extremity edema. Pulses are 2 + in the bilateral DP/PT.  1. Severe aortic stenosis: He has severe, stage D aortic valve stenosis. Given his LV systolic dysfunction, this likely represents low flow/low gradient AS. I have personally reviewed the echo images. The aortic valve is thickened, calcified with limited leaflet mobility. I think he would benefit from AVR. With two prior open chest procedures, he is not a good candidate for conventional AVR by surgical approach. I think he may be a good candidate  for TAVR. I have reviewed the natural history of aortic stenosis with the patient. We have discussed the limitations of medical therapy and the poor prognosis associated with symptomatic aortic stenosis. We have reviewed potential treatment options, including palliative medical therapy, conventional surgical aortic valve replacement, and transcatheter aortic valve replacement. We discussed treatment options in the context of the patient's specific comorbid medical conditions. He would like to proceed with planning for TAVR. He will have a right and left heart cath tomorrow. We will then arrange his cardiac CT, CTA of the chest/abdomen/pelvis (staging based on renal function) PFTs, carotid dopplers and PT assessment. He will also be see by one of the CT surgeons on our TAVR team.   We will follow with you. I did not get to see him until 7pm tonight. His wife was not here. I will come by in the morning tomorrow to speak to both of them.   Lauree Chandler 04/12/2018 7:26 PM

## 2018-04-13 ENCOUNTER — Encounter (HOSPITAL_COMMUNITY)
Admission: EM | Disposition: A | Discharge status: Home / Self Care | Payer: Self-pay | Source: Home / Self Care | Attending: Cardiology

## 2018-04-13 ENCOUNTER — Institutional Professional Consult (permissible substitution): Payer: Medicare Other | Admitting: Cardiovascular Disease

## 2018-04-13 ENCOUNTER — Encounter (HOSPITAL_COMMUNITY): Payer: Self-pay | Admitting: Internal Medicine

## 2018-04-13 ENCOUNTER — Encounter (HOSPITAL_COMMUNITY): Payer: Self-pay

## 2018-04-13 DIAGNOSIS — E1169 Type 2 diabetes mellitus with other specified complication: Secondary | ICD-10-CM

## 2018-04-13 DIAGNOSIS — I428 Other cardiomyopathies: Secondary | ICD-10-CM

## 2018-04-13 DIAGNOSIS — I5043 Acute on chronic combined systolic (congestive) and diastolic (congestive) heart failure: Secondary | ICD-10-CM

## 2018-04-13 DIAGNOSIS — I10 Essential (primary) hypertension: Secondary | ICD-10-CM

## 2018-04-13 HISTORY — PX: RIGHT/LEFT HEART CATH AND CORONARY/GRAFT ANGIOGRAPHY: CATH118267

## 2018-04-13 LAB — BASIC METABOLIC PANEL
Anion gap: 9 (ref 5–15)
BUN: 19 mg/dL (ref 8–23)
CHLORIDE: 107 mmol/L (ref 98–111)
CO2: 25 mmol/L (ref 22–32)
CREATININE: 1.55 mg/dL — AB (ref 0.61–1.24)
Calcium: 9.3 mg/dL (ref 8.9–10.3)
GFR calc Af Amer: 51 mL/min — ABNORMAL LOW (ref >60)
GFR calc non Af Amer: 44 mL/min — ABNORMAL LOW (ref >60)
Glucose, Bld: 150 mg/dL — ABNORMAL HIGH (ref 70–99)
Potassium: 4.6 mmol/L (ref 3.5–5.1)
Sodium: 141 mmol/L (ref 135–145)

## 2018-04-13 LAB — POCT I-STAT 3, VENOUS BLOOD GAS (G3P V)
Acid-base deficit: 6 mmol/L — ABNORMAL HIGH (ref 0.0–2.0)
Acid-base deficit: 6 mmol/L — ABNORMAL HIGH (ref 0.0–2.0)
Bicarbonate: 20.1 mmol/L (ref 20.0–28.0)
Bicarbonate: 19.4 mmol/L — ABNORMAL LOW (ref 20.0–28.0)
O2 Saturation: 58 %
O2 Saturation: 61 %
PH VEN: 7.312 (ref 7.250–7.430)
TCO2: 21 mmol/L — ABNORMAL LOW (ref 22–32)
TCO2: 21 mmol/L — ABNORMAL LOW (ref 22–32)
pCO2, Ven: 39.7 mmHg — ABNORMAL LOW (ref 44.0–60.0)
pCO2, Ven: 38.4 mmHg — ABNORMAL LOW (ref 44.0–60.0)
pH, Ven: 7.311 (ref 7.250–7.430)
pO2, Ven: 33 mmHg (ref 32.0–45.0)
pO2, Ven: 35 mmHg (ref 32.0–45.0)

## 2018-04-13 LAB — POCT I-STAT 3, ART BLOOD GAS (G3+)
Acid-base deficit: 6 mmol/L — ABNORMAL HIGH (ref 0.0–2.0)
BICARBONATE: 17.8 mmol/L — AB (ref 20.0–28.0)
O2 SAT: 96 %
PCO2 ART: 30.4 mmHg — AB (ref 32.0–48.0)
PO2 ART: 83 mmHg (ref 83.0–108.0)
TCO2: 19 mmol/L — AB (ref 22–32)
pH, Arterial: 7.375 (ref 7.350–7.450)

## 2018-04-13 LAB — CBC
HCT: 47.4 % (ref 39.0–52.0)
Hemoglobin: 15.3 g/dL (ref 13.0–17.0)
MCH: 26.5 pg (ref 26.0–34.0)
MCHC: 32.3 g/dL (ref 30.0–36.0)
MCV: 82.1 fL (ref 78.0–100.0)
Platelets: 242 10*3/uL (ref 150–400)
RBC: 5.77 MIL/uL (ref 4.22–5.81)
RDW: 14.8 % (ref 11.5–15.5)
WBC: 5 10*3/uL (ref 4.0–10.5)

## 2018-04-13 LAB — GLUCOSE, CAPILLARY
Glucose-Capillary: 149 mg/dL — ABNORMAL HIGH (ref 70–99)
Glucose-Capillary: 147 mg/dL — ABNORMAL HIGH (ref 70–99)
Glucose-Capillary: 95 mg/dL (ref 70–99)
Glucose-Capillary: 192 mg/dL — ABNORMAL HIGH (ref 70–99)

## 2018-04-13 LAB — MAGNESIUM: Magnesium: 2.1 mg/dL (ref 1.7–2.4)

## 2018-04-13 SURGERY — RIGHT/LEFT HEART CATH AND CORONARY/GRAFT ANGIOGRAPHY
Anesthesia: LOCAL

## 2018-04-13 MED ORDER — METOPROLOL SUCCINATE ER 50 MG PO TB24
50.0000 mg | ORAL_TABLET | Freq: Every day | ORAL | Status: DC
  Administered 2018-04-13 – 2018-04-15 (×3): 50 mg via ORAL
  Filled 2018-04-13 (×4): qty 1

## 2018-04-13 MED ORDER — VERAPAMIL HCL 2.5 MG/ML IV SOLN
INTRAVENOUS | Status: AC
  Filled 2018-04-13: qty 2

## 2018-04-13 MED ORDER — MIDAZOLAM HCL 2 MG/2ML IJ SOLN
INTRAMUSCULAR | Status: DC | PRN
  Administered 2018-04-13: 1 mg via INTRAVENOUS

## 2018-04-13 MED ORDER — HEPARIN (PORCINE) IN NACL 1000-0.9 UT/500ML-% IV SOLN
INTRAVENOUS | Status: AC
  Filled 2018-04-13: qty 500

## 2018-04-13 MED ORDER — VERAPAMIL HCL 2.5 MG/ML IV SOLN
INTRAVENOUS | Status: DC | PRN
  Administered 2018-04-13: 10 mL via INTRA_ARTERIAL

## 2018-04-13 MED ORDER — FENTANYL CITRATE (PF) 100 MCG/2ML IJ SOLN
INTRAMUSCULAR | Status: DC | PRN
  Administered 2018-04-13: 25 ug via INTRAVENOUS

## 2018-04-13 MED ORDER — FENTANYL CITRATE (PF) 100 MCG/2ML IJ SOLN
INTRAMUSCULAR | Status: AC
  Filled 2018-04-13: qty 2

## 2018-04-13 MED ORDER — SODIUM CHLORIDE 0.9 % IV SOLN
INTRAVENOUS | Status: DC
  Administered 2018-04-13: 06:00:00 via INTRAVENOUS

## 2018-04-13 MED ORDER — FUROSEMIDE 10 MG/ML IJ SOLN
INTRAMUSCULAR | Status: AC
  Filled 2018-04-13: qty 4

## 2018-04-13 MED ORDER — MIDAZOLAM HCL 2 MG/2ML IJ SOLN
INTRAMUSCULAR | Status: AC
  Filled 2018-04-13: qty 2

## 2018-04-13 MED ORDER — FUROSEMIDE 10 MG/ML IJ SOLN
80.0000 mg | Freq: Three times a day (TID) | INTRAMUSCULAR | Status: DC
  Administered 2018-04-13 – 2018-04-14 (×4): 80 mg via INTRAVENOUS
  Filled 2018-04-13 (×4): qty 8

## 2018-04-13 MED ORDER — HEPARIN SODIUM (PORCINE) 1000 UNIT/ML IJ SOLN
INTRAMUSCULAR | Status: AC
  Filled 2018-04-13: qty 1

## 2018-04-13 MED ORDER — HEPARIN (PORCINE) IN NACL 1000-0.9 UT/500ML-% IV SOLN
INTRAVENOUS | Status: DC | PRN
  Administered 2018-04-13 (×2): 500 mL

## 2018-04-13 MED ORDER — LIDOCAINE HCL (PF) 1 % IJ SOLN
INTRAMUSCULAR | Status: DC | PRN
  Administered 2018-04-13 (×2): 2 mL

## 2018-04-13 MED ORDER — IOHEXOL 350 MG/ML SOLN
INTRAVENOUS | Status: DC | PRN
  Administered 2018-04-13: 65 mL via INTRA_ARTERIAL

## 2018-04-13 MED ORDER — HEPARIN SODIUM (PORCINE) 1000 UNIT/ML IJ SOLN
INTRAMUSCULAR | Status: DC | PRN
  Administered 2018-04-13: 3500 [IU] via INTRAVENOUS

## 2018-04-13 MED ORDER — FUROSEMIDE 10 MG/ML IJ SOLN
INTRAMUSCULAR | Status: DC | PRN
  Administered 2018-04-13: 80 mg via INTRAVENOUS

## 2018-04-13 MED ORDER — LIDOCAINE HCL (PF) 1 % IJ SOLN
INTRAMUSCULAR | Status: AC
  Filled 2018-04-13: qty 30

## 2018-04-13 SURGICAL SUPPLY — 12 items
CATH BALLN WEDGE 5F 110CM (CATHETERS) ×2 IMPLANT
CATH INFINITI 5 FR JL3.5 (CATHETERS) ×2 IMPLANT
CATH INFINITI JR4 5F (CATHETERS) ×2 IMPLANT
DEVICE RAD COMP TR BAND LRG (VASCULAR PRODUCTS) ×2 IMPLANT
GLIDESHEATH SLEND SS 6F .021 (SHEATH) ×2 IMPLANT
GUIDEWIRE INQWIRE 1.5J.035X260 (WIRE) ×1 IMPLANT
INQWIRE 1.5J .035X260CM (WIRE) ×2
KIT HEART LEFT (KITS) ×2 IMPLANT
PACK CARDIAC CATHETERIZATION (CUSTOM PROCEDURE TRAY) ×2 IMPLANT
SHEATH GLIDE SLENDER 4/5FR (SHEATH) ×2 IMPLANT
TRANSDUCER W/STOPCOCK (MISCELLANEOUS) ×2 IMPLANT
TUBING CIL FLEX 10 FLL-RA (TUBING) IMPLANT

## 2018-04-13 NOTE — Progress Notes (Signed)
Inpatient Diabetes Program Recommendations  AACE/ADA: New Consensus Statement on Inpatient Glycemic Control (2015)  Target Ranges:  Prepandial:   less than 140 mg/dL      Peak postprandial:   less than 180 mg/dL (1-2 hours)      Critically ill patients:  140 - 180 mg/dL   Lab Results  Component Value Date   GLUCAP 147 (H) 04/13/2018   HGBA1C 7.6 (H) 04/11/2018    Review of Glycemic Control Results for [name], [name] (MRN [id number]) as of 04/13/2018 16:17  Ref. Range 04/12/2018 16:34 04/12/2018 20:35 04/13/2018 08:11 04/13/2018 12:03  Glucose-Capillary Latest Ref Range: 70 - 99 mg/dL 174 (H) 108 (H) 149 (H) 147 (H)   Diabetes history: Type 2 DM Outpatient Diabetes medications: Humalog via Omnipod, Metformin 1000 mg BID, Levemir 30 units QAM Current orders for Inpatient glycemic control: Novolog 0-15 units TID, Levemir 15 units QD   Inpatient Diabetes Program Recommendations:    @0920 : Reached back out to Dr Almetta Lovely office and spoke with Jackelyn Poling CMA to follow up on the message sent from 8/29. She stated that Dr Chalmers Cater had the message and would follow up on the orders for this patient and should hear back by this afternoon.   @1618 : No message received from Dr Chalmers Cater. Dr Ellyn Hack paged.   Spoke with patient @1630 : Patient wants to continue with insulin pump following discharge. Patient does not having supplies at bedside; Encouraged wife to bring those to hospital. In preparation for discharge, would recommend patient not be discharged home on Levemir 30 units QHS if planning to use insulin pump. Should not need both regimens with current inpatient needs. Discussed risks with patient of continuing both regimens. Informed patient of attempts to reach Dr Chalmers Cater and that Dr Ellyn Hack was paged. Advised patient to review medications at discharge on the summary page. Encouraged follow up with Dr Almetta Lovely office. Patient has no further questions at this time.   Thanks,  Bronson Curb, MSN,  [facility] Diabetes Coordinator [phone] (8a-5p)

## 2018-04-13 NOTE — Interval H&P Note (Signed)
History and Physical Interval Note:  04/13/2018 9:11 AM  [name]  has presented today for surgery, with the diagnosis of usntable angina  The various methods of treatment have been discussed with the patient and family. After consideration of risks, benefits and other options for treatment, the patient has consented to  Procedure(s): RIGHT/LEFT HEART CATH AND CORONARY/GRAFT ANGIOGRAPHY (N/A) as a surgical intervention .  The patient's history has been reviewed, patient examined, no change in status, stable for surgery.  I have reviewed the patient's chart and labs.  Questions were answered to the patient's satisfaction.     [name]

## 2018-04-13 NOTE — Progress Notes (Signed)
Progress Note  Patient Name: [name] Date of Encounter: 04/13/2018  Primary Cardiologist: No primary care provider on file.  Subjective   Definitely more short of breath this morning, did not get Lasix prior to heart cath. I saw him following his heart catheterization and discussion with Dr. Haroldine Laws.   Inpatient Medications    Scheduled Meds: . amitriptyline  100 mg Oral QHS  . aspirin EC  81 mg Oral Daily  . furosemide  80 mg Intravenous TID PC  . heparin  5,000 Units Subcutaneous Q8H  . insulin aspart  0-15 Units Subcutaneous TID WC  . insulin detemir  15 Units Subcutaneous Daily  . levothyroxine  50 mcg Oral QAC breakfast  . metoprolol succinate  50 mg Oral QHS  . simvastatin  40 mg Oral QHS   Continuous Infusions:  PRN Meds: acetaminophen, nitroGLYCERIN, ondansetron (ZOFRAN) IV, zolpidem   Vital Signs    Vitals:   04/13/18 0929 04/13/18 0934 04/13/18 0939 04/13/18 0944  BP: 115/82 111/82 111/84 108/79  Pulse: 93 92 90 90  Resp: 17 20 15 20   Temp:      TempSrc:      SpO2: 96% 95% 96% 96%  Weight:      Height:        Intake/Output Summary (Last 24 hours) at 04/13/2018 1028 Last data filed at 04/12/2018 2104 Gross per 24 hour  Intake 820 ml  Output 1825 ml  Net -1005 ml   Filed Weights   04/11/18 2021 04/12/18 0518 04/13/18 0603  Weight: 81 kg 80.1 kg 79.4 kg    Telemetry    SR - Personally Reviewed  ECG    SR - Personally Reviewed  Physical Exam   Physical Exam  Constitutional: He is oriented to person, place, and time. He appears well-developed and well-nourished. He appears distressed (Not necessarily distress, but does seem to have increased work of breathing.).  HENT:  Head: Normocephalic and atraumatic.  Neck: Hepatojugular reflux and JVD (Roughly 10-12 cmH2O) present. Carotid bruit is not present (Radiated aortic stenosis murmur).  Cardiovascular: Normal rate, regular rhythm and intact distal pulses.  Occasional extrasystoles  are present. PMI is not displaced. Exam reveals no gallop and no friction rub.  Murmur heard.  Medium-pitched harsh crescendo-decrescendo mid to late systolic murmur is present with a grade of 3/6 at the upper right sternal border radiating to the neck. Pulmonary/Chest: No respiratory distress. He has rales.  Increased work of breathing, with mild basal rales.  Abdominal: Soft. Bowel sounds are normal. He exhibits no distension. There is no tenderness. There is no rebound.  Musculoskeletal: Normal range of motion. He exhibits no edema (Trivial).  Neurological: He is alert and oriented to person, place, and time.  Psychiatric: He has a normal mood and affect. His behavior is normal. Judgment and thought content normal.  Vitals reviewed.   Labs    Chemistry Recent Labs  Lab 04/11/18 2027 04/12/18 1007 04/13/18 0342  NA 142 143 141  K 4.0 4.4 4.6  CL 110 111 107  CO2 22 25 25   GLUCOSE 135* 125* 150*  BUN 19 16 19   CREATININE 1.52* 1.56* 1.55*  CALCIUM 8.7* 9.1 9.3  GFRNONAA 45* 44* 44*  GFRAA 53* 51* 51*  ANIONGAP 10 7 9      Hematology Recent Labs  Lab 04/11/18 2027 04/12/18 1007 04/13/18 0342  WBC 4.5 4.5 5.0  RBC 5.35 5.57 5.77  HGB 14.4 15.0 15.3  HCT 44.2 46.0 47.4  MCV  82.6 82.6 82.1  MCH 26.9 26.9 26.5  MCHC 32.6 32.6 32.3  RDW 15.0 14.9 14.8  PLT 264 254 242    Cardiac Enzymes Recent Labs  Lab 04/11/18 2027 04/12/18 0216  TROPONINI <0.03 <0.03    Recent Labs  Lab 04/11/18 1517  TROPIPOC 0.01     BNP Recent Labs  Lab 04/11/18 1432 04/11/18 2035  BNP 957.5* 682.8*     DDimer No results for input(s): DDIMER in the last 168 hours.    Radiology    Dg Chest 2 View  Result Date: 04/11/2018 CLINICAL DATA:  Onset of chest pain this morning while sitting position, worsened while lying supine: Dry cough, history coronary artery disease post MI and CABG, abdominal aortic aneurysm, seminoma, diabetes mellitus, hypertension, former smoker EXAM:  CHEST - 2 VIEW COMPARISON:  03/20/2013 FINDINGS: Upper normal size of cardiac silhouette post CABG. Atherosclerotic calcifications aorta and coronary arteries. Mediastinal contours and pulmonary vascularity normal. Mild bibasilar atelectasis versus infiltrate and probable small bibasilar effusions. Remaining lungs clear. No acute infiltrate or pneumothorax. Question 5 mm nodular density RIGHT upper lobe. IMPRESSION: Bibasilar atelectasis versus infiltrate and probable small pleural effusions. Question new 5 mm RIGHT upper lobe nodule; CT chest recommended to exclude pulmonary nodule. Electronically Signed   By: Lavonia Dana M.D.   On: 04/11/2018 15:50    Cardiac Studies   TTE: 04/02/18: EF 30 to 35%.  Akinesis of the anteroseptal wall.  Severely calcified aortic valve with severely cleft leaflets and restricted mobility.  Estimated moderate stenosis with mean gradient 20 mmHg.  (Considered low output as).  Mild aortic root dilation 38 mm.  Mild MR.  Moderate LA dilation.  PA pressures estimated 38 mmHg.  TREADMILL MYOVIEW STRESS TEST 04/04/2018:   10.1 METS - 8 min.    Nuclear stress EF: 29%.  Horizontal ST segment depression ST segment depression of 1 mm was noted during stress in the III, aVF, II, V5 and V6 leads.  Findings consistent with prior myocardial infarction.  This is a high risk study.  The left ventricular ejection fraction is severely decreased (<30%).   Small inferior basal infarct no ischemia. Severe LVE with diffuse hypokinesis EF 29% ECG positive with exercise with 1 mm ST depression in inferior lateral leads   R&LHC - just performed(High PCWP & RAP c/w with significant volume overload, acute on chronic combined systolic and diastolic heart failure)  Ost LAD to Prox LAD lesion is 40% stenosed. Prox LAD to Mid LAD lesion is 40% stenosed. Ost 1st Diag to 1st Diag lesion is 50% stenosed.   Ost RCA lesion is 100% stenosed. SVG-rPDA is normal in caliber and large.   Ost Cx to  Prox Cx lesion is 100% stenosed. SVG-OM1 is large.   Findings:  Ao =109/68 (86) ; LV = 117/29;  RA = 19; RV = 54/17 PA = 62/21 (42); PCW = 40 Fick cardiac output/index = 3.5/1.78 PVR = 0.6 WU FA sat = 96%;; PA sat = 58%, 61% PAPi = 2.2 RA/PCWP = 0.475 CPO = 0.67 Av gradient: peak 15.0 mmHG  Mean 14.65mmHG  AVA = 0.89 cm2  Assessment: 1. Severe 2v CAD with total occlusion of ostial RCA and LCx 2. Moderate non-obstructive CAD in LAD 3. EF 30% by echo (no v-gram done due to high LVEDP) 4. Severe low gradient AS with reduced cardiac output and markedly elevated filling pressures  Plan/Discussion:  1) Continue diuresis - I increased lasix to 80 IVT tid. Cut lopressor to  50 daily with low output 2) Continue with TAVR w/up. D/w Drs. Gerre Scull   Patient Profile     67 y.o. male with a history of CABG, CRF and AV disease who presented with worsening shortness of breath. Admitted for further work up.  Has now had right and left heart cath confirming severe low output aortic stenosis.  Assessment & Plan    1. Acute on chronic combined systolic and diastolic HF: Presented with worsening shortness of breath. EF slightly down from 35-40% to 30-35% back on 04/02/18. Recent myoview with non ischemic with concern for worsening valve disease. Suspect decompensation 2/2 to worsening AV.   He did have some diuresis with the IV Lasix yesterday, however appears to be extremely volume overloaded on heart catheterization.  We will therefore adjust diuretic per discussion with Dr. Haroldine Laws.  We have increased to 80 mg 3 times daily.    We will also reduce his beta-blocker dose based on low output/cardiomyopathy.  I suspect that the cardiomyopathy is probably more related to the valve then ischemic as graft seem open and the drop in EF seems to be recent as opposed to old.  2.  Severe aortic stenosis with low output: Being followed by Dr. Radford Pax as an outpatient. Further work up to  determine Surgical vs TAVR?  Clearly severe aortic stenosis by catheterization with low output and current gradient.  (By gradient alone, would not be considered severe). -->  Per discussion with Dr. Angelena Form, the plan would be to probably consider TAVR once diuresed and stable, but not likely during this hospitalization.  Would be in the next 2 to 3 weeks.  3. DM: .-- Hgb A1c 7.6  Currently on Levemir insulin for baseline (half his home dose), with sliding scale.  Glycemic control stable.  4. HL: Continue simvastatin.  LDL within goal.  5. HTN: continue on Toprol -with reduced dose.  Signed, Glenetta Hew, MD  04/13/2018, 10:28 AM  Pager # [phone]   For questions or updates, please contact [place] Please consult [email or web address] for contact info under Cardiology/STEMI.

## 2018-04-14 DIAGNOSIS — E7800 Pure hypercholesterolemia, unspecified: Secondary | ICD-10-CM

## 2018-04-14 LAB — BASIC METABOLIC PANEL
ANION GAP: 12 (ref 5–15)
BUN: 21 mg/dL (ref 8–23)
CO2: 22 mmol/L (ref 22–32)
Calcium: 8.6 mg/dL — ABNORMAL LOW (ref 8.9–10.3)
Chloride: 105 mmol/L (ref 98–111)
Creatinine, Ser: 1.35 mg/dL — ABNORMAL HIGH (ref 0.61–1.24)
GFR, EST NON AFRICAN AMERICAN: 52 mL/min — AB (ref >60)
GLUCOSE: 203 mg/dL — AB (ref 70–99)
POTASSIUM: 3.8 mmol/L (ref 3.5–5.1)
Sodium: 139 mmol/L (ref 135–145)

## 2018-04-14 LAB — CBC
HCT: 46.5 % (ref 39.0–52.0)
Hemoglobin: 15.5 g/dL (ref 13.0–17.0)
MCH: 26.9 pg (ref 26.0–34.0)
MCHC: 33.3 g/dL (ref 30.0–36.0)
MCV: 80.6 fL (ref 78.0–100.0)
Platelets: 243 10*3/uL (ref 150–400)
RBC: 5.77 MIL/uL (ref 4.22–5.81)
RDW: 14.6 % (ref 11.5–15.5)
WBC: 4 10*3/uL (ref 4.0–10.5)

## 2018-04-14 LAB — GLUCOSE, CAPILLARY
GLUCOSE-CAPILLARY: 198 mg/dL — AB (ref 70–99)
GLUCOSE-CAPILLARY: 217 mg/dL — AB (ref 70–99)
Glucose-Capillary: 175 mg/dL — ABNORMAL HIGH (ref 70–99)
Glucose-Capillary: 93 mg/dL (ref 70–99)

## 2018-04-14 LAB — MAGNESIUM: Magnesium: 1.9 mg/dL (ref 1.7–2.4)

## 2018-04-14 NOTE — Progress Notes (Signed)
Progress Note  Patient Name: [name] Date of Encounter: 04/14/2018  Primary Cardiologist: Radford Pax  Subjective   Doing much better.   Excellent urine output, net -6.8 L since admission.  However weight not much changed.  Only down 4 pounds from maximum weight on this admission. Cardiac cath showed known disease with total occlusion of ostial RCA and circumflex but with patent SVG to both vessels, moderate disease in LAD, LVEF approximately 30%, severely elevated intracardiac filling pressures.  The dose of beta-blocker was decreased and intravenous diuretics were increased.  Ao =109/68 (86) LV = 117/29 RA = 19 RV = 54/17 PA = 62/21 (42) PCW = 40 Fick cardiac output/index = 3.5/1.78 PVR = 0.6 WU FA sat = 96% PA sat = 58%, 61% PAPi = 2.2 RA/PCWP = 0.475 CPO = 0.67 Av gradient: peak 15.0 mmHG  Mean 14.58mmHG  AVA = 0.89 cm2  Assessment: 1. Severe 2v CAD with total occlusion of ostial RCA and LCx 2. Moderate non-obstructive CAD in LAD 3. EF 30% by echo (no v-gram done due to high LVEDP) 4. Severe low gradient AS with reduced cardiac output and markedly elevated filling pressures  Inpatient Medications    Scheduled Meds: . amitriptyline  100 mg Oral QHS  . aspirin EC  81 mg Oral Daily  . furosemide  80 mg Intravenous TID PC  . heparin  5,000 Units Subcutaneous Q8H  . insulin aspart  0-15 Units Subcutaneous TID WC  . insulin detemir  15 Units Subcutaneous Daily  . levothyroxine  50 mcg Oral QAC breakfast  . metoprolol succinate  50 mg Oral QHS  . simvastatin  40 mg Oral QHS   Continuous Infusions:  PRN Meds: acetaminophen, nitroGLYCERIN, ondansetron (ZOFRAN) IV, zolpidem   Vital Signs    Vitals:   04/13/18 1440 04/13/18 1755 04/13/18 1952 04/14/18 0549  BP: 106/82 102/72 120/88 106/75  Pulse: 96 90 (!) 108 98  Resp: (!) 32 (!) 21 (!) 29 18  Temp:   98.8 F (37.1 C) 97.8 F (36.6 C)  TempSrc:   Oral Oral  SpO2: 97% 96% 96% 94%  Weight:    79.2 kg    Height:        Intake/Output Summary (Last 24 hours) at 04/14/2018 1028 Last data filed at 04/14/2018 0900 Gross per 24 hour  Intake 1440 ml  Output 6325 ml  Net -4885 ml   Filed Weights   04/12/18 0518 04/13/18 0603 04/14/18 0549  Weight: 80.1 kg 79.4 kg 79.2 kg    Telemetry    Sinus rhythm, brief bursts of nonsustained VT, 3 beats- Personally Reviewed  ECG    Sinus rhythm, prolonged QT 500 ms- Personally Reviewed  Physical Exam  Appears fit, looks comfortable GEN: No acute distress.   Neck:  Roughly 8 cm JVD, faint carotid bruits radiating from the chest Cardiac: RRR, remarkably quiet, late peaking 2/6 aortic ejection murmur radiating to the carotids no diastolic murmurs, rubs, or gallops.  Respiratory: Clear to auscultation bilaterally. GI: Soft, nontender, non-distended  MS: No edema; No deformity. Neuro:  Nonfocal  Psych: Normal affect   Labs    Chemistry Recent Labs  Lab 04/12/18 1007 04/13/18 0342 04/14/18 0429  NA 143 141 139  K 4.4 4.6 3.8  CL 111 107 105  CO2 25 25 22   GLUCOSE 125* 150* 203*  BUN 16 19 21   CREATININE 1.56* 1.55* 1.35*  CALCIUM 9.1 9.3 8.6*  GFRNONAA 44* 44* 52*  GFRAA 51* 51* >60  ANIONGAP 7 9 12      Hematology Recent Labs  Lab 04/12/18 1007 04/13/18 0342 04/14/18 0429  WBC 4.5 5.0 4.0  RBC 5.57 5.77 5.77  HGB 15.0 15.3 15.5  HCT 46.0 47.4 46.5  MCV 82.6 82.1 80.6  MCH 26.9 26.5 26.9  MCHC 32.6 32.3 33.3  RDW 14.9 14.8 14.6  PLT 254 242 243    Cardiac Enzymes Recent Labs  Lab 04/11/18 2027 04/12/18 0216  TROPONINI <0.03 <0.03    Recent Labs  Lab 04/11/18 1517  TROPIPOC 0.01     BNP Recent Labs  Lab 04/11/18 1432 04/11/18 2035  BNP 957.5* 682.8*     DDimer No results for input(s): DDIMER in the last 168 hours.   Radiology    No results found.  Cardiac Studies  Echo 08/19, 2019 Study Conclusions  - Left ventricle: The cavity size was normal. There was mild focal   basal hypertrophy of the  septum. Systolic function was moderately   to severely reduced. The estimated ejection fraction was in the   range of 30% to 35%. There is akinesis of the anteroseptal   myocardium. There was no evidence of elevated ventricular filling   pressure by Doppler parameters. - Aortic valve: Moderately calcified annulus. Trileaflet; severely   thickened, severely calcified leaflets. Valve mobility was   restricted. There was moderate stenosis. There was mild   regurgitation. Peak velocity (S): 293 cm/s. Mean gradient (S): 20   mm Hg. - Aorta: Mild advancement in aortic stenosis (prior mean gradient   58mmHg). Aortic root dimension: 38 mm (ED). - Ascending aorta: The ascending aorta was mildly dilated. - Mitral valve: There was mild regurgitation. - Left atrium: The atrium was moderately dilated. - Tricuspid valve: There was mild regurgitation. - Pulmonary arteries: Systolic pressure was mildly increased. PA   peak pressure: 38 mm Hg (S).  Cath April 13, 2018 Ao =109/68 (86) LV = 117/29 RA = 19 RV = 54/17 PA = 62/21 (42) PCW = 40 Fick cardiac output/index = 3.5/1.78 PVR = 0.6 WU FA sat = 96% PA sat = 58%, 61% PAPi = 2.2 RA/PCWP = 0.475 CPO = 0.67 Av gradient: peak 15.0 mmHG  Mean 14.77mmHG  AVA = 0.89 cm2  Assessment: 1. Severe 2v CAD with total occlusion of ostial RCA and LCx 2. Moderate non-obstructive CAD in LAD 3. EF 30% by echo (no v-gram done due to high LVEDP) 4. Severe low gradient AS with reduced cardiac output and markedly elevated filling pressures  Patient Profile     67 y.o. male with a history of CABG, CRF and AV disease who presented with worsening shortness of breath. Admitted for further work up.  Has now had right and left heart cath confirming severe low output aortic stenosis.  Assessment & Plan    1. Acute on chronic combined systolic and diastolic HF: EF 53-74%, no new coronary abnormalities (patent grafts to occluded vessels), worsening cardiomyopathy  secondary to aortic stenosis.  Severe right and left heart volume overload/elevated filling pressures.  Continue IV diuretics. Renal function improving with diuresis. 2.  Severe aortic stenosis with low output:  Work-up for TAVR in progress, expect this will be performed as an outpatient in 2 or 3 weeks 3. DM: .-- Hgb A1c 7.6 4. HL: Continue simvastatin.  LDL within goal. 5. HTN: continue on Toprol (reduced dose due to low cardiac output/limited stroke-volume).  For questions or updates, please contact [place] Please consult [email or web address] for contact info under Cardiology/STEMI.  Signed, Sanda Klein, MD  04/14/2018, 10:28 AM

## 2018-04-15 LAB — CBC
HEMATOCRIT: 46.8 % (ref 39.0–52.0)
HEMOGLOBIN: 15.5 g/dL (ref 13.0–17.0)
MCH: 26.5 pg (ref 26.0–34.0)
MCHC: 33.1 g/dL (ref 30.0–36.0)
MCV: 80.1 fL (ref 78.0–100.0)
Platelets: 230 10*3/uL (ref 150–400)
RBC: 5.84 MIL/uL — ABNORMAL HIGH (ref 4.22–5.81)
RDW: 14.2 % (ref 11.5–15.5)
WBC: 4.4 10*3/uL (ref 4.0–10.5)

## 2018-04-15 LAB — BASIC METABOLIC PANEL
ANION GAP: 10 (ref 5–15)
BUN: 22 mg/dL (ref 8–23)
CHLORIDE: 102 mmol/L (ref 98–111)
CO2: 26 mmol/L (ref 22–32)
CREATININE: 1.68 mg/dL — AB (ref 0.61–1.24)
Calcium: 8.5 mg/dL — ABNORMAL LOW (ref 8.9–10.3)
GFR calc non Af Amer: 40 mL/min — ABNORMAL LOW (ref >60)
GFR, EST AFRICAN AMERICAN: 47 mL/min — AB (ref >60)
Glucose, Bld: 246 mg/dL — ABNORMAL HIGH (ref 70–99)
POTASSIUM: 3.5 mmol/L (ref 3.5–5.1)
Sodium: 138 mmol/L (ref 135–145)

## 2018-04-15 LAB — GLUCOSE, CAPILLARY
GLUCOSE-CAPILLARY: 236 mg/dL — AB (ref 70–99)
Glucose-Capillary: 238 mg/dL — ABNORMAL HIGH (ref 70–99)
Glucose-Capillary: 176 mg/dL — ABNORMAL HIGH (ref 70–99)
Glucose-Capillary: 141 mg/dL — ABNORMAL HIGH (ref 70–99)

## 2018-04-15 MED ORDER — FUROSEMIDE 40 MG PO TABS
40.0000 mg | ORAL_TABLET | Freq: Every day | ORAL | Status: DC
  Administered 2018-04-15 – 2018-04-16 (×2): 40 mg via ORAL
  Filled 2018-04-15 (×2): qty 1

## 2018-04-15 MED ORDER — MAGNESIUM HYDROXIDE 400 MG/5ML PO SUSP
15.0000 mL | Freq: Every day | ORAL | Status: DC | PRN
  Administered 2018-04-15: 15 mL via ORAL
  Filled 2018-04-15: qty 30

## 2018-04-15 NOTE — Progress Notes (Signed)
Progress Note  Patient Name: [name] Date of Encounter: 04/15/2018  Primary Cardiologist: Radford Pax  Subjective   Feels well. Continues with prodigious diuresis, now net -10 L, weight down about 15 pounds. Significant uptick in serum creatinine today. Low normal BP.  Inpatient Medications    Scheduled Meds: . amitriptyline  100 mg Oral QHS  . aspirin EC  81 mg Oral Daily  . furosemide  80 mg Intravenous TID PC  . heparin  5,000 Units Subcutaneous Q8H  . insulin aspart  0-15 Units Subcutaneous TID WC  . insulin detemir  15 Units Subcutaneous Daily  . levothyroxine  50 mcg Oral QAC breakfast  . metoprolol succinate  50 mg Oral QHS  . simvastatin  40 mg Oral QHS   Continuous Infusions:  PRN Meds: acetaminophen, nitroGLYCERIN, ondansetron (ZOFRAN) IV, zolpidem   Vital Signs    Vitals:   04/14/18 1158 04/14/18 1739 04/14/18 2054 04/15/18 0636  BP: 96/73 94/67 106/63 112/62  Pulse: 96  (!) 112 92  Resp: 18  20 18   Temp: 98.3 F (36.8 C)  97.7 F (36.5 C) 97.6 F (36.4 C)  TempSrc: Oral  Oral Tympanic  SpO2: 98%  100% 100%  Weight:    74.2 kg  Height:        Intake/Output Summary (Last 24 hours) at 04/15/2018 0825 Last data filed at 04/15/2018 0700 Gross per 24 hour  Intake 702 ml  Output 3250 ml  Net -2548 ml   Filed Weights   04/13/18 0603 04/14/18 0549 04/15/18 0636  Weight: 79.4 kg 79.2 kg 74.2 kg    Telemetry    Sinus rhythm/borderline sinus tachycardia- Personally Reviewed  ECG    No new tracing- Personally Reviewed  Physical Exam   GEN: No acute distress.   Neck: No JVD Cardiac: RRR, late peaking 2/6 aortic ejection murmur radiating to both carotids, no diastolic murmurs, no rubs or gallops Respiratory: Clear to auscultation bilaterally. GI: Soft, nontender, non-distended  MS: No edema; No deformity. Neuro:  Nonfocal  Psych: Normal affect   Labs    Chemistry Recent Labs  Lab 04/13/18 0342 04/14/18 0429 04/15/18 0442  NA 141 139  138  K 4.6 3.8 3.5  CL 107 105 102  CO2 25 22 26   GLUCOSE 150* 203* 246*  BUN 19 21 22   CREATININE 1.55* 1.35* 1.68*  CALCIUM 9.3 8.6* 8.5*  GFRNONAA 44* 52* 40*  GFRAA 51* >60 47*  ANIONGAP 9 12 10      Hematology Recent Labs  Lab 04/13/18 0342 04/14/18 0429 04/15/18 0442  WBC 5.0 4.0 4.4  RBC 5.77 5.77 5.84*  HGB 15.3 15.5 15.5  HCT 47.4 46.5 46.8  MCV 82.1 80.6 80.1  MCH 26.5 26.9 26.5  MCHC 32.3 33.3 33.1  RDW 14.8 14.6 14.2  PLT 242 243 230    Cardiac Enzymes Recent Labs  Lab 04/11/18 2027 04/12/18 0216  TROPONINI <0.03 <0.03    Recent Labs  Lab 04/11/18 1517  TROPIPOC 0.01     BNP Recent Labs  Lab 04/11/18 1432 04/11/18 2035  BNP 957.5* 682.8*     DDimer No results for input(s): DDIMER in the last 168 hours.   Radiology    No results found.  Cardiac Studies   Echo 08/19, 2019 Study Conclusions  - Left ventricle: The cavity size was normal. There was mild focal basal hypertrophy of the septum. Systolic function was moderately to severely reduced. The estimated ejection fraction was in the range of 30%  to 35%. There is akinesis of the anteroseptal myocardium. There was no evidence of elevated ventricular filling pressure by Doppler parameters. - Aortic valve: Moderately calcified annulus. Trileaflet; severely thickened, severely calcified leaflets. Valve mobility was restricted. There was moderate stenosis. There was mild regurgitation. Peak velocity (S): 293 cm/s. Mean gradient (S): 20 mm Hg. - Aorta: Mild advancement in aortic stenosis (prior mean gradient 80mmHg). Aortic root dimension: 38 mm (ED). - Ascending aorta: The ascending aorta was mildly dilated. - Mitral valve: There was mild regurgitation. - Left atrium: The atrium was moderately dilated. - Tricuspid valve: There was mild regurgitation. - Pulmonary arteries: Systolic pressure was mildly increased. PA peak pressure: 38 mm Hg (S).  Cath April 13, 2018 Ao =109/68 (86) LV = 117/29 RA = 19 RV = 54/17 PA = 62/21 (42) PCW = 40 Fick cardiac output/index = 3.5/1.78 PVR = 0.6 WU FA sat = 96% PA sat = 58%, 61% PAPi = 2.2 RA/PCWP = 0.475 CPO = 0.67 Av gradient: peak 15.0 mmHG Mean 14.19mmHG AVA = 0.89 cm2  Assessment: 1. Severe 2v CAD with total occlusion of ostial RCA and LCx 2. Moderate non-obstructive CAD in LAD 3. EF 30% by echo (no v-gram done due to high LVEDP) 4. Severe low gradient AS with reduced cardiac output and markedly elevated filling pressures  Patient Profile     67 y.o. male with coronary artery disease, occluded native RCA and LCx with patent distal bypasses, moderate to severe left ventricular systolic dysfunction (EF 19%) with acute exacerbation of chronic systolic heart failure, low gradient severe aortic stenosis, diabetes mellitus, hyperlipidemia and hypertension.  Assessment & Plan    1. Acute on chroniccombined systolic and diastolicHF:  I think we have reached close to euvolemic status.  Switch to oral diuretics and monitor in-house for 24 hours before discharge.  EF 30-35%, no new coronary abnormalities (patent grafts to occluded vessels), worsening cardiomyopathy secondary to aortic stenosis.    Anticipate discharge tomorrow. 2.Severe aortic stenosis with low output: Work-up for TAVR in progress, expect this will be performed as an outpatient in 2 or 3 weeks 3. DM:.-- Hgb A1c 7.6 4. FX:[id number] simvastatin. LDL within goal. 5. ZHG:DJMEQAST on Toprol (reduced dose due to low cardiac output/limited stroke-volume).  We will plan early follow-up with the structural heart team within the next week.  Outpatient CT angiograms to be performed after we will recheck renal function on the outpatient diuretic regimen.  For questions or updates, please contact [place] Please consult [email or web address] for contact info under Cardiology/STEMI.      Signed, Sanda Klein, MD  04/15/2018, 8:25 AM

## 2018-04-16 ENCOUNTER — Encounter (HOSPITAL_COMMUNITY): Payer: Self-pay | Admitting: Physician Assistant

## 2018-04-16 ENCOUNTER — Encounter: Payer: Self-pay | Admitting: Cardiovascular Disease

## 2018-04-16 DIAGNOSIS — I5043 Acute on chronic combined systolic (congestive) and diastolic (congestive) heart failure: Secondary | ICD-10-CM | POA: Diagnosis present

## 2018-04-16 DIAGNOSIS — I5023 Acute on chronic systolic (congestive) heart failure: Secondary | ICD-10-CM

## 2018-04-16 LAB — BASIC METABOLIC PANEL
Anion gap: 9 (ref 5–15)
BUN: 21 mg/dL (ref 8–23)
CALCIUM: 8.8 mg/dL — AB (ref 8.9–10.3)
CHLORIDE: 105 mmol/L (ref 98–111)
CO2: 26 mmol/L (ref 22–32)
Creatinine, Ser: 1.53 mg/dL — ABNORMAL HIGH (ref 0.61–1.24)
GFR calc non Af Amer: 45 mL/min — ABNORMAL LOW (ref >60)
GFR, EST AFRICAN AMERICAN: 52 mL/min — AB (ref >60)
Glucose, Bld: 182 mg/dL — ABNORMAL HIGH (ref 70–99)
POTASSIUM: 3.9 mmol/L (ref 3.5–5.1)
Sodium: 140 mmol/L (ref 135–145)

## 2018-04-16 LAB — CBC
HCT: 46.4 % (ref 39.0–52.0)
HEMOGLOBIN: 15.3 g/dL (ref 13.0–17.0)
MCH: 26.6 pg (ref 26.0–34.0)
MCHC: 33 g/dL (ref 30.0–36.0)
MCV: 80.6 fL (ref 78.0–100.0)
Platelets: 257 10*3/uL (ref 150–400)
RBC: 5.76 MIL/uL (ref 4.22–5.81)
RDW: 14.4 % (ref 11.5–15.5)
WBC: 4 10*3/uL (ref 4.0–10.5)

## 2018-04-16 LAB — GLUCOSE, CAPILLARY: GLUCOSE-CAPILLARY: 202 mg/dL — AB (ref 70–99)

## 2018-04-16 MED ORDER — METOPROLOL SUCCINATE ER 50 MG PO TB24: 50.0000 mg | ORAL_TABLET | Freq: Every day | ORAL | 11 refills | Status: DC

## 2018-04-16 NOTE — Progress Notes (Addendum)
Progress Note  Patient Name: [name] Date of Encounter: 04/16/2018  Primary Cardiologist: Radford Pax  Subjective   He feels great. No chest pain. Breathing is much better.   Inpatient Medications    Scheduled Meds: . amitriptyline  100 mg Oral QHS  . aspirin EC  81 mg Oral Daily  . furosemide  40 mg Oral Daily  . heparin  5,000 Units Subcutaneous Q8H  . insulin aspart  0-15 Units Subcutaneous TID WC  . insulin detemir  15 Units Subcutaneous Daily  . levothyroxine  50 mcg Oral QAC breakfast  . metoprolol succinate  50 mg Oral QHS  . simvastatin  40 mg Oral QHS   Continuous Infusions:  PRN Meds: acetaminophen, magnesium hydroxide, nitroGLYCERIN, ondansetron (ZOFRAN) IV, zolpidem   Vital Signs    Vitals:   04/15/18 2000 04/15/18 2054 04/16/18 0401 04/16/18 0402  BP:  99/72 95/71   Pulse:  96 97   Resp:      Temp:  97.8 F (36.6 C) 97.8 F (36.6 C)   TempSrc:  Oral Oral   SpO2: 100% 98% 99%   Weight:    74.3 kg  Height:        Intake/Output Summary (Last 24 hours) at 04/16/2018 0754 Last data filed at 04/16/2018 0143 Gross per 24 hour  Intake 720 ml  Output 3000 ml  Net -2280 ml   Filed Weights   04/14/18 0549 04/15/18 0636 04/16/18 0402  Weight: 79.2 kg 74.2 kg 74.3 kg    Telemetry   sinus- Personally Reviewed  ECG    No AM EKG  Physical Exam    General: Well developed, well nourished, NAD  HEENT: OP clear, mucus membranes moist  SKIN: warm, dry. No rashes. Neuro: No focal deficits  Musculoskeletal: Muscle strength 5/5 all ext  Psychiatric: Mood and affect normal  Neck: No JVD, no carotid bruits, no thyromegaly, no lymphadenopathy.  Lungs:Clear bilaterally, no wheezes, rhonci, crackles Cardiovascular: Regular rate and rhythm. Late peaking systolic murmur.  Abdomen:Soft. Bowel sounds present. Non-tender.  Extremities: No lower extremity edema. Pulses are 2 + in the bilateral DP/PT.   Labs    Chemistry Recent Labs  Lab 04/14/18 0429  04/15/18 0442 04/16/18 0348  NA 139 138 140  K 3.8 3.5 3.9  CL 105 102 105  CO2 22 26 26   GLUCOSE 203* 246* 182*  BUN 21 22 21   CREATININE 1.35* 1.68* 1.53*  CALCIUM 8.6* 8.5* 8.8*  GFRNONAA 52* 40* 45*  GFRAA >60 47* 52*  ANIONGAP 12 10 9      Hematology Recent Labs  Lab 04/14/18 0429 04/15/18 0442 04/16/18 0348  WBC 4.0 4.4 4.0  RBC 5.77 5.84* 5.76  HGB 15.5 15.5 15.3  HCT 46.5 46.8 46.4  MCV 80.6 80.1 80.6  MCH 26.9 26.5 26.6  MCHC 33.3 33.1 33.0  RDW 14.6 14.2 14.4  PLT 243 230 257    Cardiac Enzymes Recent Labs  Lab 04/11/18 2027 04/12/18 0216  TROPONINI <0.03 <0.03    Recent Labs  Lab 04/11/18 1517  TROPIPOC 0.01     BNP Recent Labs  Lab 04/11/18 1432 04/11/18 2035  BNP 957.5* 682.8*     DDimer No results for input(s): DDIMER in the last 168 hours.   Radiology    No results found.  Cardiac Studies   Echo 04/02/18:  - Left ventricle: The cavity size was normal. There was mild focal basal hypertrophy of the septum. Systolic function was moderately to severely reduced. The  estimated ejection fraction was in the range of 30% to 35%. There is akinesis of the anteroseptal myocardium. There was no evidence of elevated ventricular filling pressure by Doppler parameters. - Aortic valve: Moderately calcified annulus. Trileaflet; severely thickened, severely calcified leaflets. Valve mobility was restricted. There was moderate stenosis. There was mild regurgitation. Peak velocity (S): 293 cm/s. Mean gradient (S): 20 mm Hg. - Aorta: Mild advancement in aortic stenosis (prior mean gradient 68mmHg). Aortic root dimension: 38 mm (ED). - Ascending aorta: The ascending aorta was mildly dilated. - Mitral valve: There was mild regurgitation. - Left atrium: The atrium was moderately dilated. - Tricuspid valve: There was mild regurgitation. - Pulmonary arteries: Systolic pressure was mildly increased. PA peak pressure: 38 mm Hg  (S).  Cath April 13, 2018 Ao =109/68 (86) LV = 117/29 RA = 19 RV = 54/17 PA = 62/21 (42) PCW = 40 Fick cardiac output/index = 3.5/1.78 PVR = 0.6 WU FA sat = 96% PA sat = 58%, 61% PAPi = 2.2 RA/PCWP = 0.475 CPO = 0.67 Av gradient: peak 15.0 mmHG Mean 14.29mmHG AVA = 0.89 cm2  Assessment: 1. Severe 2v CAD with total occlusion of ostial RCA and LCx 2. Moderate non-obstructive CAD in LAD 3. EF 30% by echo (no v-gram done due to high LVEDP) 4. Severe low gradient AS with reduced cardiac output and markedly elevated filling pressures  Patient Profile     67 y.o. male with coronary artery disease, occluded native RCA and LCx with patent distal bypasses, moderate to severe left ventricular systolic dysfunction (EF 82%) with acute exacerbation of chronic systolic heart failure, low gradient severe aortic stenosis, diabetes mellitus, hyperlipidemia and hypertension.  Assessment & Plan    1. Acute on chroniccombined systolic and diastolicCHF: He was admitted with acute CHF and now is net negative 11.8 liters since admission. He is close to his baseline euvolemic state. He is now on oral Lasix. His LVEF is 30-35%. CAD is stable by cath last week with occluded RCA and Circumflex with patent bypass grafts to the RCA and Circumflex. Continue Lasix 40 mg daily. Creatinine at baseline.   2.Severe low output/low gradient aortic stenosis: Continue workup for TAVR. We contact him to arrange his CT scans, PFTs and carotid dopplers. He will also need to be seen by one of the CT surgeons on our TAVR team. We will arrange this.   3. NO:IBBCWU metformin.   4. Hyperlipidemia: Continue statin.   5. HTN:BP is controlled. On the soft side today. Continue Toprol but will continue 50 mg daily. He had been on 100 mg at home.   Discharge home today. He will need hospital follow up with an office APP or Dr Radford Pax within the next 7-10 days. The TAVR team will arrange follow up testing for TAVR. Note,  Toprol dose lowered to 50 mg daily due to soft BP.    For questions or updates, please contact [place] Please consult [email or web address] for contact info under Cardiology/STEMI.      Signed, Lauree Chandler, MD  04/16/2018, 7:54 AM

## 2018-04-16 NOTE — Discharge Summary (Signed)
Discharge Summary    Patient ID: [name],  MRN: [id number], DOB/AGE: 04-13-1951 67 y.o.  Admit date: 04/11/2018 Discharge date: 04/16/2018  Primary Care Provider: Dorthy Cooler, [place] Primary Cardiologist: Fransico Him, MD  Discharge Diagnoses    Principal Problem:   Acute on chronic combined systolic and diastolic CHF (congestive heart failure) ([facility]) Active Problems:   Aortic stenosis, severe   Hypertension   Chest pain   Allergies Allergies  Allergen Reactions  . Lipitor [Atorvastatin] Other (See Comments)    Muscle pain  . Adhesive [Tape] Rash    Diagnostic Studies/Procedures    Echo 04/02/18:  - Left ventricle: The cavity size was normal. There was mild focal basal hypertrophy of the septum. Systolic function was moderately to severely reduced. The estimated ejection fraction was in the range of 30% to 35%. There is akinesis of the anteroseptal myocardium. There was no evidence of elevated ventricular filling pressure by Doppler parameters. - Aortic valve: Moderately calcified annulus. Trileaflet; severely thickened, severely calcified leaflets. Valve mobility was restricted. There was moderate stenosis. There was mild regurgitation. Peak velocity (S): 293 cm/s. Mean gradient (S): 20 mm Hg. - Aorta: Mild advancement in aortic stenosis (prior mean gradient 27mmHg). Aortic root dimension: 38 mm (ED). - Ascending aorta: The ascending aorta was mildly dilated. - Mitral valve: There was mild regurgitation. - Left atrium: The atrium was moderately dilated. - Tricuspid valve: There was mild regurgitation. - Pulmonary arteries: Systolic pressure was mildly increased. PA peak pressure: 38 mm Hg (S).  Cath April 13, 2018 Ao =109/68 (86) LV = 117/29 RA = 19 RV = 54/17 PA = 62/21 (42) PCW = 40 Fick cardiac output/index = 3.5/1.78 PVR = 0.6 WU FA sat = 96% PA sat = 58%, 61% PAPi = 2.2 RA/PCWP = 0.475 CPO = 0.67 Av gradient: peak  15.0 mmHG Mean 14.33mmHG AVA = 0.89 cm2  Assessment: 1. Severe 2v CAD with total occlusion of ostial RCA and LCx 2. Moderate non-obstructive CAD in LAD 3. EF 30% by echo (no v-gram done due to high LVEDP) 4. Severe low gradient AS with reduced cardiac output and markedly elevated filling pressures _____________   History of Present Illness     67 y.o. male with coronary artery disease, occluded native RCA and LCx with patent distal bypasses, moderate to severe left ventricular systolic dysfunction (EF 78%) was admitted 8/28 with acute exacerbation of chronic systolic heart failure, low gradient severe aortic stenosis, diabetes mellitus, hyperlipidemia and hypertension.  Hospital Course     Consultants: TAVR, Dr. Angelena Form  [name] had a recent stress Myoview which showed an EF of 30-35% possible worsening of aortic stenosis.  He came to the hospital on the day of admission with worsening exertional shortness of breath and orthopnea.  He contacted our office he recommended that he go to the ER and he did so.  He was admitted for acute on chronic heart failure, aortic stenosis, and chest pain.  He was diuresed a total of 11.8 L and was felt euvolemic with a weight of 163.8 pounds.  He was maintaining his weight on oral Lasix.  His creatinine increased with diuresis from 1.5-1.68.  However, once he got off IV Lasix, his creatinine came back down to baseline.  A recent echo had showed possible severe aortic stenosis.   A TAVR consult was called. He was seen by Dr. Angelena Form, and testing for TAVR was arranged.  He had a right and left heart cath.  Cardiac  catheterization results are above.  There was no significant changes in his anatomy.  However, he had severe low gradient aortic stenosis with reduced cardiac output and markedly elevated filling pressures.  His beta-blocker was reduced due to low cardiac output.  On 04/16/2018, he was seen by Dr. Angelena Form and all data were reviewed.  He  will be contacted by the TAVR clinic to arrange CT scans, PFTs and carotid Dopplers.  He will also be seen by 1 of the surgeons, which they will arrange.  He is to continue on Lasix at 40 mg daily, continue daily weights.  He is to continue on metformin for his diabetes.  _____________  Discharge Vitals Blood pressure 95/71, pulse 97, temperature 97.8 F (36.6 C), temperature source Oral, resp. rate 18, height 5\' 11"  (1.803 m), weight 74.3 kg, SpO2 99 %.  Filed Weights   04/14/18 0549 04/15/18 0636 04/16/18 0402  Weight: 79.2 kg 74.2 kg 74.3 kg    Labs & Radiologic Studies    CBC Recent Labs    04/15/18 0442 04/16/18 0348  WBC 4.4 4.0  HGB 15.5 15.3  HCT 46.8 46.4  MCV 80.1 80.6  PLT 230 601   Basic Metabolic Panel Recent Labs    04/14/18 0429 04/15/18 0442 04/16/18 0348  NA 139 138 140  K 3.8 3.5 3.9  CL 105 102 105  CO2 22 26 26   GLUCOSE 203* 246* 182*  BUN 21 22 21   CREATININE 1.35* 1.68* 1.53*  CALCIUM 8.6* 8.5* 8.8*  MG 1.9  --   --    Cardiac Enzymes Troponin I  Date Value Ref Range Status  04/12/2018 <0.03 <0.03 ng/mL Final    Comment:    Performed at [facility], [place] [address] [place], [place] [address]  04/11/2018 <0.03 <0.03 ng/mL Final    Comment:    Performed at [facility] [address], [place], [place] [address]   BNP    Component Value Date/Time   BNP 682.8 (H) 04/11/2018 2035   Hemoglobin A1C Lab Results  Component Value Date   HGBA1C 7.6 (H) 04/11/2018   Fasting Lipid Panel Cholesterol, Total  Date Value Ref Range Status  03/28/2018 104 100 - 199 mg/dL Final   Cholesterol  Date Value Ref Range Status  04/12/2018 95 0 - 200 mg/dL Final   HDL  Date Value Ref Range Status  04/12/2018 33 (L) >40 mg/dL Final  03/28/2018 35 (L) >39 mg/dL Final   LDL Calculated  Date Value Ref Range Status  03/28/2018 58 0 - 99 mg/dL Final   LDL Cholesterol  Date Value Ref Range Status  04/12/2018 55 0 - 99 mg/dL Final     Comment:           Total Cholesterol/HDL:CHD Risk Coronary Heart Disease Risk Table                     Men   Women  1/2 Average Risk   3.4   3.3  Average Risk       5.0   4.4  2 X Average Risk   9.6   7.1  3 X Average Risk  23.4   11.0        Use the calculated Patient Ratio above and the CHD Risk Table to determine the patient's CHD Risk.        ATP III CLASSIFICATION (LDL):  <100     mg/dL   Optimal  100-129  mg/dL   Near or Above                    Optimal  130-159  mg/dL   Borderline  160-189  mg/dL   High  >190     mg/dL   Very High Performed at [facility] [address] [place], [place] [address]    Triglycerides  Date Value Ref Range Status  04/12/2018 36 <150 mg/dL Final   Thyroid Function Tests TSH  Date Value Ref Range Status  04/11/2018 5.756 (H) 0.350 - 4.500 uIU/mL Final    Comment:    Performed by a 3rd Generation assay with a functional sensitivity of <=0.01 uIU/mL. Performed at [facility], [place] [address], [place], [place] [address]   06/26/2014 3.19 0.35 - 4.50 uIU/mL Final   _____________  Dg Chest 2 View  Result Date: 04/11/2018 CLINICAL DATA:  Onset of chest pain this morning while sitting position, worsened while lying supine: Dry cough, history coronary artery disease post MI and CABG, abdominal aortic aneurysm, seminoma, diabetes mellitus, hypertension, former smoker EXAM: CHEST - 2 VIEW COMPARISON:  03/20/2013 FINDINGS: Upper normal size of cardiac silhouette post CABG. Atherosclerotic calcifications aorta and coronary arteries. Mediastinal contours and pulmonary vascularity normal. Mild bibasilar atelectasis versus infiltrate and probable small bibasilar effusions. Remaining lungs clear. No acute infiltrate or pneumothorax. Question 5 mm nodular density RIGHT upper lobe. IMPRESSION: Bibasilar atelectasis versus infiltrate and probable small pleural effusions. Question new 5 mm RIGHT upper lobe nodule; CT chest recommended to exclude  pulmonary nodule. Electronically Signed   By: Lavonia Dana M.D.   On: 04/11/2018 15:50   Disposition   Pt is being discharged home today in good condition.  Follow-up Plans & Appointments    Follow-up Information    Koirala, Dibas, MD.   Specialty:  Family Medicine Contact information: [place] [address] [phone]        Burnell Blanks, MD Follow up.   Specialty:  Cardiology Why:  TAVR clinic will contact you. Contact information: [place] [address]  [address] [phone]        [name], MD Follow up.   Specialty:  Cardiology Why:  As scheduled Contact information: [address] [place] [address] [phone]          Discharge Instructions    (HEART FAILURE PATIENTS) Call MD:  Anytime you have any of the following symptoms: 1) 3 pound weight gain in 24 hours or 5 pounds in 1 week 2) shortness of breath, with or without a dry hacking cough 3) swelling in the hands, feet or stomach 4) if you have to sleep on extra pillows at night in order to breathe.   Complete by:  As directed    Diet - low sodium heart healthy   Complete by:  As directed    Increase activity slowly   Complete by:  As directed       Discharge Medications   Allergies as of 04/16/2018      Reactions   Lipitor [atorvastatin] Other (See Comments)   Muscle pain   Adhesive [tape] Rash      Medication List    TAKE these medications   amitriptyline 100 MG tablet Commonly known as:  ELAVIL Take 100 mg by mouth daily.   B-D ULTRAFINE III SHORT PEN 31G X 8 MM Misc Generic drug:  Insulin Pen Needle  1 each by Other route daily as needed (GLUCOSE TESTING (INSULINE NEEDLE)).   HUMALOG KWIKPEN 100 UNIT/ML KiwkPen Generic drug:  insulin lispro Per pump;  0.8 unit(s)/per hour average and patient gives a bolus, if needed   hydroxypropyl methylcellulose / hypromellose 2.5 % ophthalmic solution Commonly  known as:  ISOPTO TEARS / GONIOVISC Place 1 drop into both eyes 4 (four) times daily as needed for dry eyes.   LEVEMIR FLEXTOUCH 100 UNIT/ML Pen Generic drug:  Insulin Detemir Inject 30 Units into the skin daily after breakfast.   levothyroxine 50 MCG tablet Commonly known as:  SYNTHROID, LEVOTHROID Take 50 mcg by mouth daily before breakfast.   metFORMIN 500 MG tablet Commonly known as:  GLUCOPHAGE Take 1,000 mg by mouth 2 (two) times daily with a meal.   metoprolol succinate 50 MG 24 hr tablet Commonly known as:  TOPROL-XL Take 1 tablet (50 mg total) by mouth daily. Take with or immediately following a meal. What changed:    medication strength  how much to take   multivitamin tablet Take 1 tablet by mouth daily.   ONE TOUCH ULTRA TEST test strip Generic drug:  glucose blood 1 each by Other route as needed for other.   simvastatin 40 MG tablet Commonly known as:  ZOCOR Take 40 mg by mouth every evening.   SUPER B COMPLEX PO Take 1 tablet by mouth daily.   zolpidem 10 MG tablet Commonly known as:  AMBIEN Take 10 mg by mouth daily.          Outstanding Labs/Studies   None  Duration of Discharge Encounter   Greater than 30 minutes including physician time.  Alcario Drought Adean Milosevic NP 04/16/2018, 10:05 AM

## 2018-04-16 NOTE — Discharge Instructions (Signed)
Weigh daily, record, bring to office visits.  Limit sodium to 500 mg per meal  Limit all fluids to between 1.5 and 2 L/quarts daily  Heart Failure Action Plan A heart failure action plan helps you understand what to do when you have symptoms of heart failure. Follow the plan that was created by you and your health care provider. Review your plan each time you visit your health care provider. Red zone These signs and symptoms mean you should get medical help right away:  You have trouble breathing when resting.  You have a dry cough that is getting worse.  You have swelling or pain in your legs or abdomen that is getting worse.  You suddenly gain more than 2-3 lb (0.9-1.4 kg) in a day, or more than 5 lb (2.3 kg) in one week. This amount may be more or less depending on your condition.  You have trouble staying awake or you feel confused.  You have chest pain.  You do not have an appetite.  You pass out.  If you experience any of these symptoms:  Call your local emergency services (911 in the [place] right away or seek help at the emergency department of the nearest hospital.  Yellow zone These signs and symptoms mean your condition may be getting worse and you should make some changes:  You have trouble breathing when you are active or you need to sleep with extra pillows.  You have swelling in your legs or abdomen.  You gain 2-3 lb (0.9-1.4 kg) in one day, or 5 lb (2.3 kg) in one week. This amount may be more or less depending on your condition.  You get tired easily.  You have trouble sleeping.  You have a dry cough.  If you experience any of these symptoms:  Contact your health care provider within the next day.  Your health care provider may adjust your medicines.  Green zone These signs mean you are doing well and can continue what you are doing:  You do not have shortness of breath.  You have very little swelling or no new swelling.  Your weight is stable  (no gain or loss).  You have a normal activity level.  You do not have chest pain or any other new symptoms.  Follow these instructions at home:  Take over-the-counter and prescription medicines only as told by your health care provider.  Weigh yourself daily. Your target weight is __________ lb (__________ kg). ? Call your health care provider if you gain more than __________ lb (__________ kg) in a day, or more than __________ lb (__________ kg) in one week.  Eat a heart-healthy diet. Work with a diet and nutrition specialist (dietitian) to create an eating plan that is best for you.  Keep all follow-up visits as told by your health care provider. This is important. Where to find more information:  American Heart Association: www.heart.org Summary  Follow the action plan that was created by you and your health care provider.  Get help right away if you have any symptoms in the Red zone. This information is not intended to replace advice given to you by your health care provider. Make sure you discuss any questions you have with your health care provider. Document Released: 09/10/2016 Document Revised: 09/10/2016 Document Reviewed: 09/10/2016 Elsevier Interactive Patient Education  [employer].

## 2018-04-16 NOTE — Progress Notes (Signed)
Pt in stable condition. I reviewed D/c instructions with patient and wife. No further questions. PT taken via wheelchair to private vehicle

## 2018-04-17 ENCOUNTER — Encounter (HOSPITAL_COMMUNITY): Payer: Self-pay | Attending: Cardiology

## 2018-04-17 DIAGNOSIS — I2510 Atherosclerotic heart disease of native coronary artery without angina pectoris: Secondary | ICD-10-CM | POA: Insufficient documentation

## 2018-04-19 ENCOUNTER — Encounter (HOSPITAL_COMMUNITY): Payer: Self-pay

## 2018-04-20 ENCOUNTER — Encounter: Payer: Self-pay | Admitting: Cardiology

## 2018-04-20 ENCOUNTER — Encounter (HOSPITAL_COMMUNITY): Payer: Self-pay

## 2018-04-20 ENCOUNTER — Other Ambulatory Visit: Payer: Self-pay

## 2018-04-20 DIAGNOSIS — I350 Nonrheumatic aortic (valve) stenosis: Secondary | ICD-10-CM

## 2018-04-20 DIAGNOSIS — R0609 Other forms of dyspnea: Secondary | ICD-10-CM

## 2018-04-23 ENCOUNTER — Encounter: Payer: Self-pay | Admitting: Cardiology

## 2018-04-23 ENCOUNTER — Ambulatory Visit (INDEPENDENT_AMBULATORY_CARE_PROVIDER_SITE_OTHER): Payer: Medicare Other | Admitting: Cardiology

## 2018-04-23 VITALS — BP 110/52 | HR 91 | Ht 71.0 in | Wt 168.6 lb

## 2018-04-23 DIAGNOSIS — I10 Essential (primary) hypertension: Secondary | ICD-10-CM | POA: Diagnosis not present

## 2018-04-23 DIAGNOSIS — I420 Dilated cardiomyopathy: Secondary | ICD-10-CM | POA: Diagnosis not present

## 2018-04-23 DIAGNOSIS — I350 Nonrheumatic aortic (valve) stenosis: Secondary | ICD-10-CM

## 2018-04-23 DIAGNOSIS — I2510 Atherosclerotic heart disease of native coronary artery without angina pectoris: Secondary | ICD-10-CM

## 2018-04-23 DIAGNOSIS — E785 Hyperlipidemia, unspecified: Secondary | ICD-10-CM | POA: Diagnosis not present

## 2018-04-23 NOTE — Progress Notes (Signed)
Cardiology Office Note:    Date:  04/23/2018   ID:  [name], DOB 27-Jun-1951, MRN [id number]  PCP:  [name], MD  Cardiologist:  Fransico Him, MD    Referring MD: [name], MD   Chief Complaint  Patient presents with  . Coronary Artery Disease  . Aortic Stenosis  . Hypertension    History of Present Illness:    [name] is a 67 y.o. male with a hx of CAD, aortic stenosis and systolic HF. He had a non-STEMI in 2013 treated with PCI of the LCx/OM. LHC in 5/14 demonstrated severe 2 vessel CAD with critical in-stent restenosis in the LCx. He underwent CABG (SVG-PDA, SVG-LCx). He also has a history of aortic stenosis. Echo 10/2016 showed moderately reduced LVF at 35-40% with diffuse HK, mild AS and moderate AR.  Right and Left heart cath showed mild to moderate AS with AVA 0.837cm2 and EF 45% with patent SVG to RPDA and SVG to OM1 With occluded native vessels.  Medical therapy was continued    He was admitted on 04/11/2018 with acute exacerbation of chronic systolic heart failure and low gradient severe aortic stenosis.  He had had a recent Myoview done which showed an EF of 30 to 35% and it was felt that his aortic stenosis may be worsening.  He was admitted with orthopnea and PND.  He diuresed a total of 11.8 L and was felt to be euvolemic at a weight of 163.8 pounds.  He did have a small bump in his creatinine while he was hospitalized.  2D echocardiogram was consistent with possible severe aortic stenosis and a TAVR consult was obtained.  He underwent right and left heart cath which revealed severe two-vessel CAD with occluded ostial RCA and left circumflex and moderate nonobstructive CAD of the LAD.  He was found to have severe low gradient left ear with reduced cardiac output and markedly elevated filling pressures.  His beta-blocker was stopped.  He is currently in the process of being worked up for future TAVR.  He is here today for followup and is doing well.  He denies  any chest pain or pressure, SOB, DOE, PND, orthopnea, LE edema, dizziness, palpitations or syncope. He is compliant with his meds and is tolerating meds with no SE.      Past Medical History:  Diagnosis Date  . Acute on chronic combined systolic and diastolic CHF (congestive heart failure) ([place]) 04/16/2018  . Aortic stenosis   . Cancer [facility])    mediastinal seminoma resected 1985 - benign  . Chronic kidney disease   . Coronary artery disease 02/2012   a. s/p stenting in 2013  b. s/p CABGx2V (SVG--> PDA, SVG--> LCx).  . Diabetes mellitus    neuropathy  insulin dependent  . Dyslipidemia   . Erectile dysfunction   . GERD (gastroesophageal reflux disease)   . Hypertension   . Stroke [facility])    TIA's    Past Surgical History:  Procedure Laterality Date  . COLONOSCOPY  07/25/2012   Procedure: COLONOSCOPY;  Surgeon: Winfield Cunas., MD;  Location: [facility] ENDOSCOPY;  Service: Endoscopy;  Laterality: N/A;  . COLONOSCOPY N/A 12/02/2013   Procedure: COLONOSCOPY;  Surgeon: Winfield Cunas., MD;  Location: [facility] ENDOSCOPY;  Service: Endoscopy;  Laterality: N/A;  . CORONARY ARTERY BYPASS GRAFT N/A 01/04/2013   Procedure: CORONARY ARTERY BYPASS GRAFTING (CABG) times two using right saphenous vein harvested with endoscope.;  Surgeon: Ivin Poot, MD;  Location:  [place] OR;  Service: Open Heart Surgery;  Laterality: N/A;  . ESOPHAGOGASTRODUODENOSCOPY  07/20/2012   Procedure: ESOPHAGOGASTRODUODENOSCOPY (EGD);  Surgeon: Winfield Cunas., MD;  Location: [facility] ENDOSCOPY;  Service: Endoscopy;  Laterality: N/A;  . ESOPHAGOGASTRODUODENOSCOPY N/A 12/02/2013   Procedure: ESOPHAGOGASTRODUODENOSCOPY (EGD);  Surgeon: Winfield Cunas., MD;  Location: [facility] ENDOSCOPY;  Service: Endoscopy;  Laterality: N/A;  . HOT HEMOSTASIS N/A 12/02/2013   Procedure: HOT HEMOSTASIS (ARGON PLASMA COAGULATION/BICAP);  Surgeon: Winfield Cunas., MD;  Location: [facility] ENDOSCOPY;  Service: Endoscopy;  Laterality: N/A;  . INTRAOPERATIVE  TRANSESOPHAGEAL ECHOCARDIOGRAM N/A 01/04/2013   Procedure: INTRAOPERATIVE TRANSESOPHAGEAL ECHOCARDIOGRAM;  Surgeon: Ivin Poot, MD;  Location: [place];  Service: Open Heart Surgery;  Laterality: N/A;  . LEFT HEART CATHETERIZATION WITH CORONARY ANGIOGRAM N/A 03/06/2012   Procedure: LEFT HEART CATHETERIZATION WITH CORONARY ANGIOGRAM;  Surgeon: [name], MD;  Location: [place] CATH LAB;  Service: Cardiovascular;  Laterality: N/A;  . lithotripsy    . RADIAL ARTERY HARVEST Left 01/04/2013   Procedure: RADIAL ARTERY HARVEST;  Surgeon: Ivin Poot, MD;  Location: [place];  Service: Vascular;  Laterality: Left;  Artery not havested. Unsuitable for use.  . resection mediastinal seminonma    . RIGHT/LEFT HEART CATH AND CORONARY/GRAFT ANGIOGRAPHY N/A 11/15/2016   Procedure: Right/Left Heart Cath and Coronary/Graft Angiography;  Surgeon: Leonie Man, MD;  Location: [place] CV LAB;  Service: Cardiovascular;  Laterality: N/A;  . RIGHT/LEFT HEART CATH AND CORONARY/GRAFT ANGIOGRAPHY N/A 04/13/2018   Procedure: RIGHT/LEFT HEART CATH AND CORONARY/GRAFT ANGIOGRAPHY;  Surgeon: Jolaine Artist, MD;  Location: [place] CV LAB;  Service: Cardiovascular;  Laterality: N/A;    Current Medications: Current Meds  Medication Sig  . amitriptyline (ELAVIL) 100 MG tablet Take 100 mg by mouth daily.   . B Complex-C (SUPER B COMPLEX PO) Take 1 tablet by mouth daily.  . B-D ULTRAFINE III SHORT PEN 31G X 8 MM MISC 1 each by Other route daily as needed (GLUCOSE TESTING (INSULINE NEEDLE)).   Marland Kitchen HUMALOG KWIKPEN 100 UNIT/ML KiwkPen Per pump;  0.8 unit(s)/per hour average and patient gives a bolus, if needed  . hydroxypropyl methylcellulose / hypromellose (ISOPTO TEARS / GONIOVISC) 2.5 % ophthalmic solution Place 1 drop into both eyes 4 (four) times daily as needed for dry eyes.  Marland Kitchen LEVEMIR FLEXTOUCH 100 UNIT/ML Pen Inject 30 Units into the skin daily after breakfast.   . levothyroxine (SYNTHROID, LEVOTHROID) 50 MCG  tablet Take 50 mcg by mouth daily before breakfast.  . metFORMIN (GLUCOPHAGE) 500 MG tablet Take 1,000 mg by mouth 2 (two) times daily with a meal.  . metoprolol succinate (TOPROL-XL) 50 MG 24 hr tablet Take 1 tablet (50 mg total) by mouth daily. Take with or immediately following a meal.  . Multiple Vitamin (MULTIVITAMIN) tablet Take 1 tablet by mouth daily.  . ONE TOUCH ULTRA TEST test strip 1 each by Other route as needed for other.   . simvastatin (ZOCOR) 40 MG tablet Take 40 mg by mouth every evening.  . zolpidem (AMBIEN) 10 MG tablet Take 10 mg by mouth daily.      Allergies:   Lipitor [atorvastatin] and Adhesive [tape]   Social History   Socioeconomic History  . Marital status: Married    Spouse name: Not on file  . Number of children: Not on file  . Years of education: Not on file  . Highest education level: Not on file  Occupational History  . Not on file  Social Needs  . Financial resource strain: Not on file  . Food insecurity:    Worry: Not on file    Inability: Not on file  . Transportation needs:    Medical: Not on file    Non-medical: Not on file  Tobacco Use  . Smoking status: Former Smoker    Last attempt to quit: 03/06/1985    Years since quitting: 33.1  . Smokeless tobacco: Never Used  Substance and Sexual Activity  . Alcohol use: No  . Drug use: No  . Sexual activity: Not Currently  Lifestyle  . Physical activity:    Days per week: Not on file    Minutes per session: Not on file  . Stress: Not on file  Relationships  . Social connections:    Talks on phone: Not on file    Gets together: Not on file    Attends religious service: Not on file    Active member of club or organization: Not on file    Attends meetings of clubs or organizations: Not on file    Relationship status: Not on file  Other Topics Concern  . Not on file  Social History Narrative  . Not on file     Family History: The patient's family history includes Cancer in his  brother; Diabetes in his father, mother, and sister; Heart failure in his mother; Hypertension in his mother.  ROS:   Please see the history of present illness.    ROS  All other systems reviewed and negative.   EKGs/Labs/Other Studies Reviewed:    The following studies were reviewed today: Cardiac cath and echo  EKG:  EKG is  ordered today and showed NSR at 92bpm with LVH   Recent Labs: 03/28/2018: ALT 21 04/11/2018: B Natriuretic Peptide 682.8; TSH 5.756 04/14/2018: Magnesium 1.9 04/16/2018: BUN 21; Creatinine, Ser 1.53; Hemoglobin 15.3; Platelets 257; Potassium 3.9; Sodium 140   Recent Lipid Panel    Component Value Date/Time   CHOL 95 04/12/2018 0216   CHOL 104 03/28/2018 0850   TRIG 36 04/12/2018 0216   HDL 33 (L) 04/12/2018 0216   HDL 35 (L) 03/28/2018 0850   CHOLHDL 2.9 04/12/2018 0216   VLDL 7 04/12/2018 0216   LDLCALC 55 04/12/2018 0216   LDLCALC 58 03/28/2018 0850    Physical Exam:    VS:  BP (!) 110/52   Pulse 91   Ht '5\' 11"'  (1.803 m)   Wt 168 lb 9.6 oz (76.5 kg)   SpO2 99%   BMI 23.51 kg/m     Wt Readings from Last 3 Encounters:  04/23/18 168 lb 9.6 oz (76.5 kg)  04/16/18 163 lb 12.8 oz (74.3 kg)  04/04/18 177 lb (80.3 kg)     GEN:  Well nourished, well developed in no acute distress HEENT: Normal NECK: No JVD; No carotid bruits LYMPHATICS: No lymphadenopathy CARDIAC: RRR, no  rubs, gallops.  2/6 late peaking systolic murmur at RUSB RESPIRATORY:  Clear to auscultation without rales, wheezing or rhonchi  ABDOMEN: Soft, non-tender, non-distended MUSCULOSKELETAL:  No edema; No deformity  SKIN: Warm and dry NEUROLOGIC:  Alert and oriented x 3 PSYCHIATRIC:  Normal affect   ASSESSMENT:    1. Aortic stenosis, severe   2. Coronary artery disease involving native coronary artery of native heart without angina pectoris   3. Dilated cardiomyopathy ([place])   4. Essential hypertension   5. Dyslipidemia    PLAN:    In order of problems listed  above:  1.  Severe AS - 2D echocardiogram on 04/02/2018 showed moderate aortic stenosis but left heart catheterization she was consistent with low gradient severe aortic stenosis.  He is currently in the process for work-up for TAVR.  He has a be met pending today to make sure that renal function is stable to get chest CT angios done.  2.  ASCAD - s/p non-STEMI in 2013 treated with PCI of the LCx/OM. LHC in 5/14 demonstrated severe 2 vessel CAD with critical in-stent restenosis in the LCx. He underwent CABG (SVG-PDA, SVG-LCx).  Repeat cath 2018 showed mild to moderate AS with AVA 0.837cm2 and EF 45% with patent SVG to RPDA and SVG to OM1 with occluded native vessels.    Repeat cath 03/2018 showed severe two-vessel CAD with total occlusion of the ostial RCA and left circumflex and moderate nonobstructive disease of the LAD with patent SVG to the RPDA and SVG to the OM1.  He will continue on beta-blocker and statin.  3.  DCM -likely related to an ischemic component as well as due to valvular heart disease with severe low-grade left ear.  EF 30 to 35% by echo 04/02/2018.  4.  HTN -BP is well controlled on exam today.  He will continue on Toprol XL 50 mg daily.  5.  Hyperlipidemia with LDL goal < 70.  His LDL was 55 on 04/12/2008 and ALT normal at 21.  He will continue on simvastatin 40 mg daily.  6.   Chronic combined systolic/diastolic CHF -he appears euvolemic on exam today.  He currently not on diuretics due to worsening renal function when he was diuresed in the hospital.  Has no lower extremity edema on exam his lungs are clear.  Medication Adjustments/Labs and Tests Ordered: Current medicines are reviewed at length with the patient today.  Concerns regarding medicines are outlined above.  Orders Placed This Encounter  Procedures  . EKG 12-Lead   No orders of the defined types were placed in this encounter.   Signed, Fransico Him, MD  04/23/2018 3:27 PM    [place]

## 2018-04-23 NOTE — Patient Instructions (Addendum)
Medication Instructions:  Your physician recommends that you continue on your current medications as directed. Please refer to the Current Medication list given to you today.  Lab: Today: BMET   Follow-Up: Your physician wants you to follow-up in: 6 months with Dr. Radford Pax. You will receive a reminder letter in the mail two months in advance. If you don't receive a letter, please call our office to schedule the follow-up appointment.  If you need a refill on your cardiac medications before your next appointment, please call your pharmacy.

## 2018-04-24 ENCOUNTER — Encounter (HOSPITAL_COMMUNITY): Payer: Self-pay

## 2018-04-24 LAB — BASIC METABOLIC PANEL
BUN/Creatinine Ratio: 16 (ref 10–24)
BUN: 25 mg/dL (ref 8–27)
CO2: 23 mmol/L (ref 20–29)
CREATININE: 1.58 mg/dL — AB (ref 0.76–1.27)
Calcium: 9.4 mg/dL (ref 8.6–10.2)
Chloride: 105 mmol/L (ref 96–106)
GFR calc Af Amer: 51 mL/min/{1.73_m2} — ABNORMAL LOW (ref >59)
GFR, EST NON AFRICAN AMERICAN: 44 mL/min/{1.73_m2} — AB (ref >59)
GLUCOSE: 109 mg/dL — AB (ref 65–99)
Potassium: 4.8 mmol/L (ref 3.5–5.2)
SODIUM: 140 mmol/L (ref 134–144)

## 2018-04-25 ENCOUNTER — Telehealth: Payer: Self-pay

## 2018-04-25 NOTE — Telephone Encounter (Signed)
Left patient a letter to remove from work until Dr. Angelena Form advises further, per Dr. Radford Pax. The patient accepted and expressed understanding.      [name], MD  Sarina Ill, RN  Given the degree of AS I think it is best to keep him out of work until he is seen by Dr. Angelena Form.  Traci

## 2018-04-26 ENCOUNTER — Ambulatory Visit (HOSPITAL_BASED_OUTPATIENT_CLINIC_OR_DEPARTMENT_OTHER)
Admission: RE | Admit: 2018-04-26 | Discharge: 2018-04-26 | Disposition: A | Discharge status: Home / Self Care | Payer: Medicare Other | Source: Ambulatory Visit | Attending: Cardiovascular Disease | Admitting: Cardiovascular Disease

## 2018-04-26 ENCOUNTER — Ambulatory Visit (HOSPITAL_COMMUNITY)
Admission: RE | Admit: 2018-04-26 | Discharge: 2018-04-26 | Disposition: A | Discharge status: Home / Self Care | Payer: Medicare Other | Source: Ambulatory Visit | Attending: Cardiovascular Disease | Admitting: Cardiovascular Disease

## 2018-04-26 ENCOUNTER — Encounter: Payer: Self-pay | Admitting: Physical Therapy

## 2018-04-26 ENCOUNTER — Other Ambulatory Visit: Payer: Self-pay

## 2018-04-26 ENCOUNTER — Ambulatory Visit: Payer: Medicare Other | Attending: Cardiovascular Disease | Admitting: Physical Therapy

## 2018-04-26 ENCOUNTER — Encounter (HOSPITAL_COMMUNITY): Payer: Self-pay

## 2018-04-26 DIAGNOSIS — I350 Nonrheumatic aortic (valve) stenosis: Secondary | ICD-10-CM

## 2018-04-26 DIAGNOSIS — K7689 Other specified diseases of liver: Secondary | ICD-10-CM | POA: Diagnosis not present

## 2018-04-26 DIAGNOSIS — R0609 Other forms of dyspnea: Secondary | ICD-10-CM | POA: Diagnosis not present

## 2018-04-26 DIAGNOSIS — J90 Pleural effusion, not elsewhere classified: Secondary | ICD-10-CM | POA: Diagnosis not present

## 2018-04-26 DIAGNOSIS — Z87891 Personal history of nicotine dependence: Secondary | ICD-10-CM | POA: Insufficient documentation

## 2018-04-26 DIAGNOSIS — I358 Other nonrheumatic aortic valve disorders: Secondary | ICD-10-CM | POA: Diagnosis not present

## 2018-04-26 DIAGNOSIS — R2689 Other abnormalities of gait and mobility: Secondary | ICD-10-CM | POA: Insufficient documentation

## 2018-04-26 DIAGNOSIS — N200 Calculus of kidney: Secondary | ICD-10-CM | POA: Diagnosis not present

## 2018-04-26 DIAGNOSIS — R918 Other nonspecific abnormal finding of lung field: Secondary | ICD-10-CM | POA: Insufficient documentation

## 2018-04-26 DIAGNOSIS — I517 Cardiomegaly: Secondary | ICD-10-CM | POA: Diagnosis not present

## 2018-04-26 DIAGNOSIS — I2510 Atherosclerotic heart disease of native coronary artery without angina pectoris: Secondary | ICD-10-CM | POA: Insufficient documentation

## 2018-04-26 DIAGNOSIS — I700 Atherosclerosis of aorta: Secondary | ICD-10-CM | POA: Insufficient documentation

## 2018-04-26 LAB — PULMONARY FUNCTION TEST
DL/VA % PRED: 92 %
DL/VA: 4.31 ml/min/mmHg/L
DLCO COR % PRED: 61 %
DLCO UNC: 21.11 ml/min/mmHg
DLCO cor: 20.71 ml/min/mmHg
DLCO unc % pred: 62 %
FEF 25-75 Pre: 2.91 L/sec
FEF2575-%PRED-PRE: 110 %
FEV1-%PRED-PRE: 80 %
FEV1-PRE: 2.45 L
FEV1FVC-%Pred-Pre: 121 %
FEV6-%Pred-Pre: 68 %
FEV6-Pre: 2.62 L
FEV6FVC-%PRED-PRE: 104 %
FVC-%Pred-Pre: 65 %
FVC-Pre: 2.62 L
PRE FEV1/FVC RATIO: 93 %
Pre FEV6/FVC Ratio: 100 %
RV % PRED: 91 %
RV: 2.27 L
TLC % pred: 77 %
TLC: 5.57 L

## 2018-04-26 MED ORDER — IOPAMIDOL (ISOVUE-370) INJECTION 76%
100.0000 mL | Freq: Once | INTRAVENOUS | Status: AC | PRN
  Administered 2018-04-26: 100 mL via INTRAVENOUS

## 2018-04-26 MED ORDER — ALBUTEROL SULFATE (2.5 MG/3ML) 0.083% IN NEBU: 2.5000 mg | INHALATION_SOLUTION | Freq: Once | RESPIRATORY_TRACT | Status: DC

## 2018-04-26 MED ORDER — METOPROLOL TARTRATE 5 MG/5ML IV SOLN
INTRAVENOUS | Status: AC
  Filled 2018-04-26: qty 5

## 2018-04-26 MED ORDER — METOPROLOL TARTRATE 5 MG/5ML IV SOLN
5.0000 mg | Freq: Once | INTRAVENOUS | Status: AC
  Administered 2018-04-26: 5 mg via INTRAVENOUS
  Filled 2018-04-26: qty 5

## 2018-04-26 MED ORDER — METOPROLOL TARTRATE 5 MG/5ML IV SOLN
INTRAVENOUS | Status: AC
  Filled 2018-04-26: qty 10

## 2018-04-26 NOTE — Therapy (Signed)
[place], [place], [address] Phone: [phone]   Fax:  [phone]  Physical Therapy Evaluation  Patient Details  Name: [name] MRN: [id number] Date of Birth: [birth year] Referring Provider: Dr. Lauree Chandler   Encounter Date: 04/26/2018  PT End of Session - 04/26/18 1431    Visit Number  1    PT Start Time  1430    PT Stop Time  1500    PT Time Calculation (min)  30 min       Past Medical History:  Diagnosis Date  . Acute on chronic combined systolic and diastolic CHF (congestive heart failure) ([place]) 04/16/2018  . Aortic stenosis   . Cancer [facility])    mediastinal seminoma resected 1985 - benign  . Chronic kidney disease   . Coronary artery disease 02/2012   a. s/p stenting in 2013  b. s/p CABGx2V (SVG--> PDA, SVG--> LCx).  . Diabetes mellitus    neuropathy  insulin dependent  . Dyslipidemia   . Erectile dysfunction   . GERD (gastroesophageal reflux disease)   . Hypertension   . Stroke [facility])    TIA's    Past Surgical History:  Procedure Laterality Date  . COLONOSCOPY  07/25/2012   Procedure: COLONOSCOPY;  Surgeon: Winfield Cunas., MD;  Location: [facility] ENDOSCOPY;  Service: Endoscopy;  Laterality: N/A;  . COLONOSCOPY N/A 12/02/2013   Procedure: COLONOSCOPY;  Surgeon: Winfield Cunas., MD;  Location: [facility] ENDOSCOPY;  Service: Endoscopy;  Laterality: N/A;  . CORONARY ARTERY BYPASS GRAFT N/A 01/04/2013   Procedure: CORONARY ARTERY BYPASS GRAFTING (CABG) times two using right saphenous vein harvested with endoscope.;  Surgeon: Ivin Poot, MD;  Location: [place];  Service: Open Heart Surgery;  Laterality: N/A;  . ESOPHAGOGASTRODUODENOSCOPY  07/20/2012   Procedure: ESOPHAGOGASTRODUODENOSCOPY (EGD);  Surgeon: Winfield Cunas., MD;  Location: [facility] ENDOSCOPY;  Service: Endoscopy;  Laterality: N/A;  . ESOPHAGOGASTRODUODENOSCOPY N/A 12/02/2013   Procedure: ESOPHAGOGASTRODUODENOSCOPY (EGD);   Surgeon: Winfield Cunas., MD;  Location: [facility] ENDOSCOPY;  Service: Endoscopy;  Laterality: N/A;  . HOT HEMOSTASIS N/A 12/02/2013   Procedure: HOT HEMOSTASIS (ARGON PLASMA COAGULATION/BICAP);  Surgeon: Winfield Cunas., MD;  Location: [facility] ENDOSCOPY;  Service: Endoscopy;  Laterality: N/A;  . INTRAOPERATIVE TRANSESOPHAGEAL ECHOCARDIOGRAM N/A 01/04/2013   Procedure: INTRAOPERATIVE TRANSESOPHAGEAL ECHOCARDIOGRAM;  Surgeon: Ivin Poot, MD;  Location: [place];  Service: Open Heart Surgery;  Laterality: N/A;  . LEFT HEART CATHETERIZATION WITH CORONARY ANGIOGRAM N/A 03/06/2012   Procedure: LEFT HEART CATHETERIZATION WITH CORONARY ANGIOGRAM;  Surgeon: [name], MD;  Location: [place] CATH LAB;  Service: Cardiovascular;  Laterality: N/A;  . lithotripsy    . RADIAL ARTERY HARVEST Left 01/04/2013   Procedure: RADIAL ARTERY HARVEST;  Surgeon: Ivin Poot, MD;  Location: [place];  Service: Vascular;  Laterality: Left;  Artery not havested. Unsuitable for use.  . resection mediastinal seminonma    . RIGHT/LEFT HEART CATH AND CORONARY/GRAFT ANGIOGRAPHY N/A 11/15/2016   Procedure: Right/Left Heart Cath and Coronary/Graft Angiography;  Surgeon: Leonie Man, MD;  Location: [place] CV LAB;  Service: Cardiovascular;  Laterality: N/A;  . RIGHT/LEFT HEART CATH AND CORONARY/GRAFT ANGIOGRAPHY N/A 04/13/2018   Procedure: RIGHT/LEFT HEART CATH AND CORONARY/GRAFT ANGIOGRAPHY;  Surgeon: Jolaine Artist, MD;  Location: [place] CV LAB;  Service: Cardiovascular;  Laterality: N/A;    There were no vitals filed for this visit.   Subjective Assessment - 04/26/18 1431  Subjective  noticed shortness of breath with yardwork, going to mailbox which started a couple of months ago and has been gradually worsening until recent hospitalization. Reports symptoms improved some but still mild shortness of breath with activity.    Patient Stated Goals  to fix heart    Currently in Pain?  No/denies         [facility] PT  Assessment - 04/26/18 0001      Assessment   Medical Diagnosis  severe aortic stenosis    Referring Provider  Dr. Lauree Chandler    Onset Date/Surgical Date  --   a couple of months ago     Precautions   Precautions  None      Restrictions   Weight Bearing Restrictions  No      Balance Screen   Has the patient fallen in the past 6 months  No    Has the patient had a decrease in activity level because of a fear of falling?   No    Is the patient reluctant to leave their home because of a fear of falling?   No      Home Film/video editor residence    Living Arrangements  Spouse/significant other    Home Access  Stairs to enter    Entrance Stairs-Number of Steps  1    [place]  One level      Prior Function   Level of Independence  Independent with community mobility without device      Posture/Postural Control   Posture/Postural Control  No significant limitations      ROM / Strength   AROM / PROM / Strength  AROM;Strength      AROM   Overall AROM Comments  grossly WNL, uncomfortable in R shoulder with overhead and behind head      Strength   Strength Assessment Site  Hand    Right/Left hand  Right;Left    Right Hand Grip (lbs)  100   R hand dominant   Left Hand Grip (lbs)  90      Ambulation/Gait   Gait Comments  No significant deviations. Gait distance limited by 35% for age/gender.        [facility] Pre-Surgical Assessment - 04/26/18 0001    5 Meter Walk Test- trial 1  4 sec    5 Meter Walk Test- trial 2  5 sec.     5 Meter Walk Test- trial 3  5 sec.    5 meter walk test average  4.67 sec    4 Stage Balance Test tolerated for:   3 sec.    4 Stage Balance Test Position  4    Sit To Stand Test- trial 1  11 sec.    ADL/IADL Independent with:  Bathing;Dressing;Meal prep;Finances;Yard work    6 Minute Walk- Baseline  yes    BP (mmHg)  107/66    HR (bpm)  91    02 Sat (%RA)  99 %    Modified Borg Scale for Dyspnea  0- Nothing at all     Perceived Rate of Exertion (Borg)  6-    6 Minute Walk Post Test  yes    BP (mmHg)  127/86    HR (bpm)  89    02 Sat (%RA)  92 %   dropped to 90% at 4:30 but recovered while walking   Modified Borg Scale for Dyspnea  0.5- Very, very slight shortness of breath  Perceived Rate of Exertion (Borg)  9- very light    Aerobic Endurance Distance Walked  1150              Objective measurements completed on examination: See above findings.              PT Education - 04/26/18 1527    Education Details  single limb stance at [employer]) Educated  Patient    Methods  Explanation    Comprehension  Verbalized understanding                  Plan - 04/26/18 1527    Clinical Impression Statement  see below    PT Frequency  One time visit      Clinical Impression Statement: Pt is a 67 yo male presenting to OP PT for evaluation prior to possible TAVR surgery due to severe aortic stenosis. Pt reports onset of shortness of breath approximately 2-3 months ago. Symptoms are requiring rest breaks with activity and limiting him from working at this time. Pt presents with good ROM and strength, good balance and is not at high fall risk 4 stage balance test, good walking speed and fair to poor aerobic endurance per 6 minute walk test. Pt ambulated a total of 1150 feet in 6 minute walk. O2 dropped to 90% at 4:30 but recovered to 92% by the end of the 6 minutes. Based on the Short Physical Performance Battery, patient has a frailty rating of 12/12 with </= 5/12 considered frail.   Patient demonstrated the following deficits and impairments:     Visit Diagnosis: Other abnormalities of gait and mobility     Problem List Patient Active Problem List   Diagnosis Date Noted  . Chest pain 04/11/2018  . Cardiomyopathy ([place]) 11/11/2016  . Aortic stenosis, severe 06/27/2014  . Aortic valve regurgitation 06/27/2014  . AAA (abdominal aortic aneurysm) ([place])   .  Diabetes mellitus   . Insomnia   . Stroke ([place])   . Hypertension   . TIA (transient ischemic attack)   . DM (diabetes mellitus) ([place]) 12/28/2012  . Dyslipidemia 12/28/2012  . CAD (coronary artery disease), native coronary artery 02/13/2012    [facility], PT 04/26/2018, 3:28 PM  [facility] [address] [place], [place], [address] Phone: [phone]   Fax:  [phone]  Name: ALWYN CORDNER MRN: [id number] Date of Birth: 1951-05-04

## 2018-04-26 NOTE — Progress Notes (Signed)
Ct complete. Patient denies any complaints. Patient having snack.

## 2018-04-27 ENCOUNTER — Encounter (HOSPITAL_COMMUNITY): Payer: Self-pay

## 2018-05-01 ENCOUNTER — Encounter: Payer: Self-pay | Admitting: Thoracic Surgery (Cardiothoracic Vascular Surgery)

## 2018-05-01 ENCOUNTER — Institutional Professional Consult (permissible substitution) (INDEPENDENT_AMBULATORY_CARE_PROVIDER_SITE_OTHER): Payer: Medicare Other | Admitting: Thoracic Surgery (Cardiothoracic Vascular Surgery)

## 2018-05-01 ENCOUNTER — Other Ambulatory Visit: Payer: Self-pay

## 2018-05-01 ENCOUNTER — Encounter (HOSPITAL_COMMUNITY): Payer: Self-pay

## 2018-05-01 VITALS — BP 118/70 | HR 85 | Resp 18 | Ht 71.0 in | Wt 170.4 lb

## 2018-05-01 DIAGNOSIS — I351 Nonrheumatic aortic (valve) insufficiency: Secondary | ICD-10-CM

## 2018-05-01 DIAGNOSIS — I350 Nonrheumatic aortic (valve) stenosis: Secondary | ICD-10-CM

## 2018-05-01 DIAGNOSIS — I2510 Atherosclerotic heart disease of native coronary artery without angina pectoris: Secondary | ICD-10-CM

## 2018-05-01 NOTE — Patient Instructions (Addendum)
   Continue taking all current medications without change through the day before surgery.  Have nothing to eat or drink after midnight the night before surgery.  On the morning of surgery take only levothyroxine (Synthroid) with a sip of water.

## 2018-05-01 NOTE — H&P (View-Only) (Signed)
[facility]  MULTIDISCIPLINARY HEART VALVE CLINIC  CARDIOTHORACIC SURGERY CONSULTATION REPORT  Referring Provider is [name], MD PCP is [name], MD  Chief Complaint  Patient presents with  . Aortic Stenosis    new patient consultation Cath 04/13/18, CT 04/26/18    HPI:  Patient is a 67 year old male with history of mediastinal seminoma treated with surgical resection followed by radiation therapy, coronary artery disease status post coronary artery bypass grafting x2 in 2014, chronic combined systolic and diastolic congestive heart failure with recent acute exacerbation, aortic stenosis, previous stroke, hypertension, type 2 diabetes mellitus, stage III chronic kidney disease, and hyperlipidemia who has been referred for surgical consultation to discuss treatment options for management of low gradient low ejection fraction severe symptomatic aortic stenosis.  Patient had history of mediastinal seminoma for which she underwent median sternotomy for surgical resection in 1985.  Postoperatively he was treated with mediastinal radiation therapy.  In 2013 he presented with an acute non-ST segment elevation myocardial infarction.  He was found to have multivessel coronary artery disease and treated with PCI and stenting of the left circumflex and obtuse marginal branch.  Within a year he developed critical in-stent restenosis and he subsequently underwent coronary artery bypass grafting x2 by Dr. Prescott Gum.  Grafts placed at the time of surgery included reverse saphenous vein graft to the obtuse marginal branch of left circumflex into the posterior descending branch of the distal right coronary artery.  Both the right internal mammary artery and the left radial artery were harvested at the time of surgery but felt to be poor quality and not utilized.  The patient was also noted to have severe chronic scarring throughout the mediastinum related to previous surgery and  mediastinal radiation therapy.  Patient did well following coronary artery bypass surgery and has been followed closely ever since by Dr. Radford Pax.  Echocardiograms have documented the presence of aortic stenosis with moderate to severe left ventricular systolic dysfunction with ejection fraction estimated 30 to 35%.  Transvalvular gradients across the aortic valve have suggested the presence of at least moderate aortic stenosis with mean transvalvular gradient estimated 20 mmHg, DVI reported 0.22 and aortic valve area calculated 1.02 cm.  Over the past few months the patient developed fairly rapid progression of symptoms of exertional shortness of breath, weight gain, orthopnea, PND, and lower extremity edema.  He was hospitalized in August with acute exacerbation of congestive heart failure.  He responded to IV diuresis.  He was evaluated in consultation by Dr. Angelena Form and underwent diagnostic cardiac catheterization by Dr. Haroldine Laws on June 13, 2018.  Catheterization revealed moderate nonobstructive disease in the left anterior descending coronary artery with continued patency of the vein grafts to the left circumflex and right coronary territories.  The patient stabilized medically and was discharged home.  He has undergone CT angiography and has now been referred for surgical consultation.  Patient is married and lives in [place] with his wife who is a retired Marine scientist.  The patient continues to work as a Presenter, broadcasting on the night shift.  He states that up until 6 months ago he was getting around fairly well.  His job requires a fair amount of walking but no strenuous exertion.  He notes that he first began to experience exertional shortness of breath approximately 4 months ago.  Symptoms progressed fairly rapidly until the time of his admission in August.  Since hospital discharge he has remained stable, but he continues to  get short of breath with moderate level activity.  At present he  denies resting shortness of breath, PND, orthopnea, or lower extremity edema.  His weight has been stable.  He has never had any exertional chest pain or chest tightness.  Past Medical History:  Diagnosis Date  . Acute on chronic combined systolic and diastolic CHF (congestive heart failure) ([place]) 04/16/2018  . Aortic stenosis   . Cancer [facility])    mediastinal seminoma resected 1985 - benign  . Chronic kidney disease   . Coronary artery disease 02/2012   a. s/p stenting in 2013  b. s/p CABGx2V (SVG--> PDA, SVG--> LCx).  . Diabetes mellitus    neuropathy  insulin dependent  . Dyslipidemia   . Erectile dysfunction   . GERD (gastroesophageal reflux disease)   . Hx of radiation therapy to mediastinum 1985  . Hypertension   . Malignant seminoma of mediastinum ([place]) 1985  . Stroke [facility])    TIA's    Past Surgical History:  Procedure Laterality Date  . COLONOSCOPY  07/25/2012   Procedure: COLONOSCOPY;  Surgeon: Winfield Cunas., MD;  Location: [facility] ENDOSCOPY;  Service: Endoscopy;  Laterality: N/A;  . COLONOSCOPY N/A 12/02/2013   Procedure: COLONOSCOPY;  Surgeon: Winfield Cunas., MD;  Location: [facility] ENDOSCOPY;  Service: Endoscopy;  Laterality: N/A;  . CORONARY ARTERY BYPASS GRAFT N/A 01/04/2013   Procedure: CORONARY ARTERY BYPASS GRAFTING (CABG) times two using right saphenous vein harvested with endoscope.;  Surgeon: Ivin Poot, MD;  Location: [place];  Service: Open Heart Surgery;  Laterality: N/A;  . ESOPHAGOGASTRODUODENOSCOPY  07/20/2012   Procedure: ESOPHAGOGASTRODUODENOSCOPY (EGD);  Surgeon: Winfield Cunas., MD;  Location: [facility] ENDOSCOPY;  Service: Endoscopy;  Laterality: N/A;  . ESOPHAGOGASTRODUODENOSCOPY N/A 12/02/2013   Procedure: ESOPHAGOGASTRODUODENOSCOPY (EGD);  Surgeon: Winfield Cunas., MD;  Location: [facility] ENDOSCOPY;  Service: Endoscopy;  Laterality: N/A;  . HOT HEMOSTASIS N/A 12/02/2013   Procedure: HOT HEMOSTASIS (ARGON PLASMA COAGULATION/BICAP);  Surgeon: Winfield Cunas., MD;  Location: [facility] ENDOSCOPY;  Service: Endoscopy;  Laterality: N/A;  . INTRAOPERATIVE TRANSESOPHAGEAL ECHOCARDIOGRAM N/A 01/04/2013   Procedure: INTRAOPERATIVE TRANSESOPHAGEAL ECHOCARDIOGRAM;  Surgeon: Ivin Poot, MD;  Location: [place];  Service: Open Heart Surgery;  Laterality: N/A;  . LEFT HEART CATHETERIZATION WITH CORONARY ANGIOGRAM N/A 03/06/2012   Procedure: LEFT HEART CATHETERIZATION WITH CORONARY ANGIOGRAM;  Surgeon: [name], MD;  Location: [place] CATH LAB;  Service: Cardiovascular;  Laterality: N/A;  . lithotripsy    . MEDIAN STERNOTOMY  1985  . RADIAL ARTERY HARVEST Left 01/04/2013   Procedure: RADIAL ARTERY HARVEST;  Surgeon: Ivin Poot, MD;  Location: [place];  Service: Vascular;  Laterality: Left;  Artery not havested. Unsuitable for use.  . resection mediastinal seminonma    . RIGHT/LEFT HEART CATH AND CORONARY/GRAFT ANGIOGRAPHY N/A 11/15/2016   Procedure: Right/Left Heart Cath and Coronary/Graft Angiography;  Surgeon: Leonie Man, MD;  Location: [place] CV LAB;  Service: Cardiovascular;  Laterality: N/A;  . RIGHT/LEFT HEART CATH AND CORONARY/GRAFT ANGIOGRAPHY N/A 04/13/2018   Procedure: RIGHT/LEFT HEART CATH AND CORONARY/GRAFT ANGIOGRAPHY;  Surgeon: Jolaine Artist, MD;  Location: [place] CV LAB;  Service: Cardiovascular;  Laterality: N/A;    Family History  Problem Relation Age of Onset  . Diabetes Father   . Diabetes Mother   . Heart failure Mother   . Hypertension Mother   . Cancer Brother   . Diabetes Sister     Social History  Socioeconomic History  . Marital status: Married    Spouse name: Not on file  . Number of children: Not on file  . Years of education: Not on file  . Highest education level: Not on file  Occupational History  . Not on file  Social Needs  . Financial resource strain: Not on file  . Food insecurity:    Worry: Not on file    Inability: Not on file  . Transportation needs:    Medical: Not on file     Non-medical: Not on file  Tobacco Use  . Smoking status: Former Smoker    Last attempt to quit: 03/06/1985    Years since quitting: 33.1  . Smokeless tobacco: Never Used  Substance and Sexual Activity  . Alcohol use: No  . Drug use: No  . Sexual activity: Not Currently  Lifestyle  . Physical activity:    Days per week: Not on file    Minutes per session: Not on file  . Stress: Not on file  Relationships  . Social connections:    Talks on phone: Not on file    Gets together: Not on file    Attends religious service: Not on file    Active member of club or organization: Not on file    Attends meetings of clubs or organizations: Not on file    Relationship status: Not on file  . Intimate partner violence:    Fear of current or ex partner: Not on file    Emotionally abused: Not on file    Physically abused: Not on file    Forced sexual activity: Not on file  Other Topics Concern  . Not on file  Social History Narrative  . Not on file    Current Outpatient Medications  Medication Sig Dispense Refill  . amitriptyline (ELAVIL) 100 MG tablet Take 100 mg by mouth daily.     . B Complex-C (SUPER B COMPLEX PO) Take 1 tablet by mouth daily.    . B-D ULTRAFINE III SHORT PEN 31G X 8 MM MISC 1 each by Other route daily as needed (GLUCOSE TESTING (INSULINE NEEDLE)).     Marland Kitchen HUMALOG KWIKPEN 100 UNIT/ML KiwkPen Per pump;  0.8 unit(s)/per hour average and patient gives a bolus, if needed    . hydroxypropyl methylcellulose / hypromellose (ISOPTO TEARS / GONIOVISC) 2.5 % ophthalmic solution Place 1 drop into both eyes 4 (four) times daily as needed for dry eyes.    Marland Kitchen LEVEMIR FLEXTOUCH 100 UNIT/ML Pen Inject 30 Units into the skin daily after breakfast.   5  . levothyroxine (SYNTHROID, LEVOTHROID) 50 MCG tablet Take 50 mcg by mouth daily before breakfast.    . metFORMIN (GLUCOPHAGE) 500 MG tablet Take 1,000 mg by mouth 2 (two) times daily with a meal.    . metoprolol succinate (TOPROL-XL) 50 MG 24  hr tablet Take 1 tablet (50 mg total) by mouth daily. Take with or immediately following a meal. 30 tablet 11  . Multiple Vitamin (MULTIVITAMIN) tablet Take 1 tablet by mouth daily.    . ONE TOUCH ULTRA TEST test strip 1 each by Other route as needed for other.     . simvastatin (ZOCOR) 40 MG tablet Take 40 mg by mouth every evening.    . zolpidem (AMBIEN) 10 MG tablet Take 10 mg by mouth daily.      No current facility-administered medications for this visit.     Allergies  Allergen Reactions  . Lipitor [Atorvastatin] Other (  See Comments)    Muscle pain  . Adhesive [Tape] Rash      Review of Systems:   General:  normal appetite, normal energy, + weight gain, + weight loss, no fever  Cardiac:  no chest pain with exertion, no chest pain at rest, +SOB with exertion, no current resting SOB, no current PND, + orthopnea, no palpitations, no arrhythmia, no atrial fibrillation, + LE edema, no dizzy spells, no syncope  Respiratory:  + exertional shortness of breath, no home oxygen, no productive cough, + dry cough, no bronchitis, no wheezing, no hemoptysis, no asthma, no pain with inspiration or cough, no sleep apnea, no CPAP at night  GI:   no difficulty swallowing, no reflux, no frequent heartburn, no hiatal hernia, no abdominal pain, no constipation, no diarrhea, no hematochezia, no hematemesis, no melena  GU:   no dysuria,  no frequency, no urinary tract infection, no hematuria, no enlarged prostate, no kidney stones, + kidney disease  Vascular:  no pain suggestive of claudication, no pain in feet, no leg cramps, no varicose veins, no DVT, no non-healing foot ulcer  Neuro:   + stroke, no TIA's, no seizures, no headaches, no temporary blindness one eye,  no slurred speech, no peripheral neuropathy, no chronic pain, no instability of gait, no memory/cognitive dysfunction  Musculoskeletal: no arthritis, no joint swelling, no myalgias, no difficulty walking, normal mobility   Skin:   no rash, no  itching, no skin infections, no pressure sores or ulcerations  Psych:   no anxiety, no depression, no nervousness, no unusual recent stress  Eyes:   no blurry vision, no floaters, no recent vision changes, no wears glasses or contacts  ENT:   no hearing loss, no loose or painful teeth, no dentures, last saw dentist within the past year  Hematologic:  no easy bruising, no abnormal bleeding, no clotting disorder, no frequent epistaxis  Endocrine:  + diabetes, does check CBG's at home           Physical Exam:   BP 118/70 (BP Location: Right Arm, Patient Position: Sitting, Cuff Size: Normal)   Pulse 85   Resp 18   Ht 5\' 11"  (1.803 m)   Wt 170 lb 6.4 oz (77.3 kg)   SpO2 97% Comment: RA  BMI 23.77 kg/m   General:  Thin,  well-appearing  HEENT:  Unremarkable   Neck:   no JVD, no bruits, no adenopathy   Chest:   clear to auscultation, symmetrical breath sounds, no wheezes, no rhonchi   CV:   RRR, grade III/VI crescendo/decrescendo murmur heard best at RSB,  no diastolic murmur  Abdomen:  soft, non-tender, no masses   Extremities:  warm, well-perfused, pulses palpable, no LE edema  Rectal/GU  Deferred  Neuro:   Grossly non-focal and symmetrical throughout  Skin:   Clean and dry, no rashes, no breakdown   Diagnostic Tests:  Echocardiography  Patient:    [name], [name] MR #:       [id number] Study Date: 04/02/2018 Gender:     M Age:        44 Height:     180.3 cm Weight:     80.4 kg BSA:        2.01 m^2 Pt. Status: Room:   ORDERING     Fransico Him, MD  REFERRING    Fransico Him, MD  SONOGRAPHER  [place], Will  ATTENDING    Candee Furbish, M.D.  PERFORMING   [facility], Outpatient  cc:  -------------------------------------------------------------------  LV EF: 30% -   35%  ------------------------------------------------------------------- Indications:      (I25.10).  ------------------------------------------------------------------- History:   PMH:  Acquired from  the patient and from the patient&'s chart.  Coronary artery disease.  Aortic valve disease.  PMH: Myocardial infarction.  Risk factors:  Hypertension. Diabetes mellitus. Dyslipidemia.  ------------------------------------------------------------------- Study Conclusions  - Left ventricle: The cavity size was normal. There was mild focal   basal hypertrophy of the septum. Systolic function was moderately   to severely reduced. The estimated ejection fraction was in the   range of 30% to 35%. There is akinesis of the anteroseptal   myocardium. There was no evidence of elevated ventricular filling   pressure by Doppler parameters. - Aortic valve: Moderately calcified annulus. Trileaflet; severely   thickened, severely calcified leaflets. Valve mobility was   restricted. There was moderate stenosis. There was mild   regurgitation. Peak velocity (S): 293 cm/s. Mean gradient (S): 20   mm Hg. - Aorta: Mild advancement in aortic stenosis (prior mean gradient   4mmHg). Aortic root dimension: 38 mm (ED). - Ascending aorta: The ascending aorta was mildly dilated. - Mitral valve: There was mild regurgitation. - Left atrium: The atrium was moderately dilated. - Tricuspid valve: There was mild regurgitation. - Pulmonary arteries: Systolic pressure was mildly increased. PA   peak pressure: 38 mm Hg (S).  ------------------------------------------------------------------- Labs, prior tests, procedures, and surgery: Coronary artery bypass grafting.  ------------------------------------------------------------------- Study data:  Comparison was made to the study of 10/26/2016.  Study status:  Routine.  Procedure:  The patient reported no pain pre or post test. Transthoracic echocardiography for left ventricular function evaluation. Image quality was adequate.  Study completion:  There were no complications.          Echocardiography.  M-mode, complete 2D, spectral Doppler, and color Doppler.   Birthdate: Patient birthdate: 09/10/1950.  Age:  Patient is 67 yr old.  Sex: Gender: male.    BMI: 24.7 kg/m^2.  Blood pressure:     126/78 Patient status:  Outpatient.  Study date:  Study date: 04/02/2018. Study time: 07:53 AM.  Location:  [place] [facility]  -------------------------------------------------------------------  ------------------------------------------------------------------- Left ventricle:  The cavity size was normal. There was mild focal basal hypertrophy of the septum. Systolic function was moderately to severely reduced. The estimated ejection fraction was in the range of 30% to 35%.  Regional wall motion abnormalities:   There is akinesis of the anteroseptal myocardium. There was no evidence of elevated ventricular filling pressure by Doppler parameters.  ------------------------------------------------------------------- Aortic valve:   Moderately calcified annulus. Trileaflet; severely thickened, severely calcified leaflets. Valve mobility was restricted.  Doppler:   There was moderate stenosis.   There was mild regurgitation.    VTI ratio of LVOT to aortic valve: 0.22. Valve area (VTI): 1.15 cm^2. Indexed valve area (VTI): 0.57 cm^2/m^2. Peak velocity ratio of LVOT to aortic valve: 0.19. Valve area (Vmax): 1.02 cm^2. Indexed valve area (Vmax): 0.51 cm^2/m^2. Mean velocity ratio of LVOT to aortic valve: 0.19. Valve area (Vmean): 1.02 cm^2. Indexed valve area (Vmean): 0.51 cm^2/m^2. Mean gradient (S): 20 mm Hg. Peak gradient (S): 34 mm Hg.  ------------------------------------------------------------------- Aorta:  Ascending aorta: The ascending aorta was mildly dilated.  ------------------------------------------------------------------- Mitral valve:   Moderately thickened, moderately calcified leaflets . Mobility was not restricted.  Doppler:  Transvalvular velocity was within the normal range. There was no evidence for stenosis. There was mild  regurgitation.    Peak gradient (D): 6 mm Hg.  -------------------------------------------------------------------  Left atrium:  The atrium was moderately dilated.  ------------------------------------------------------------------- Right ventricle:  The cavity size was normal. Wall thickness was normal. Systolic function was normal.  ------------------------------------------------------------------- Pulmonic valve:    Doppler:  Transvalvular velocity was within the normal range. There was no evidence for stenosis.  ------------------------------------------------------------------- Tricuspid valve:   Structurally normal valve.    Doppler: Transvalvular velocity was within the normal range. There was mild regurgitation.  ------------------------------------------------------------------- Pulmonary artery:   The main pulmonary artery was normal-sized. Systolic pressure was mildly increased.  ------------------------------------------------------------------- Right atrium:  The atrium was normal in size.  ------------------------------------------------------------------- Pericardium:  There was no pericardial effusion.  ------------------------------------------------------------------- Systemic veins: Inferior vena cava: The vessel was normal in size.  ------------------------------------------------------------------- Measurements   Left ventricle                            Value          Reference  LV ID, ED, PLAX chordal           (L)     42.7  mm       43 - 52  LV ID, ES, PLAX chordal                   35.6  mm       23 - 38  LV fx shortening, PLAX chordal    (L)     17    %        >=29  LV PW thickness, ED                       11.3  mm       ---------  IVS/LV PW ratio, ED               (H)     1.33           <=1.3  Stroke volume, 2D                         68    ml       ---------  Stroke volume/bsa, 2D                     34    ml/m^2   ---------  LV  ejection fraction, 1-p A4C             35    %        ---------  LV end-diastolic volume, 2-p              129   ml       ---------  LV end-systolic volume, 2-p               84    ml       ---------  LV ejection fraction, 2-p                 35    %        ---------  Stroke volume, 2-p                        45    ml       ---------  LV end-diastolic volume/bsa, 2-p          64    ml/m^2   ---------  LV end-systolic volume/bsa, 2-p  42    ml/m^2   ---------  Stroke volume/bsa, 2-p                    22.4  ml/m^2   ---------  LV e&', lateral                            9.46  cm/s     ---------  LV E/e&', lateral                          13.11          ---------  LV e&', medial                             8.58  cm/s     ---------  LV E/e&', medial                           14.45          ---------  LV e&', average                            9.02  cm/s     ---------  LV E/e&', average                          13.75          ---------    Ventricular septum                        Value          Reference  IVS thickness, ED                         15    mm       ---------    LVOT                                      Value          Reference  LVOT ID, S                                26    mm       ---------  LVOT area                                 5.31  cm^2     ---------  LVOT ID                                   26    mm       ---------  LVOT peak velocity, S                     56.4  cm/s     ---------  LVOT mean velocity, S  39.2  cm/s     ---------  LVOT VTI, S                               12.8  cm       ---------  LVOT peak gradient, S                     1     mm Hg    ---------  Stroke volume (SV), LVOT DP               68    ml       ---------  Stroke index (SV/bsa), LVOT DP            33.8  ml/m^2   ---------    Aortic valve                              Value          Reference  Aortic valve peak velocity, S             293   cm/s     ---------   Aortic valve mean velocity, S             204   cm/s     ---------  Aortic valve VTI, S                       59.2  cm       ---------  Aortic mean gradient, S                   20    mm Hg    ---------  Aortic peak gradient, S                   34    mm Hg    ---------  VTI ratio, LVOT/AV                        0.22           ---------  Aortic valve area, VTI                    1.15  cm^2     ---------  Aortic valve area/bsa, VTI                0.57  cm^2/m^2 ---------  Velocity ratio, peak, LVOT/AV             0.19           ---------  Aortic valve area, peak velocity          1.02  cm^2     ---------  Aortic valve area/bsa, peak               0.51  cm^2/m^2 ---------  velocity  Velocity ratio, mean, LVOT/AV             0.19           ---------  Aortic valve area, mean velocity          1.02  cm^2     ---------  Aortic valve area/bsa, mean               0.51  cm^2/m^2 ---------  velocity  Aortic regurg pressure half-time  241   ms       ---------    Aorta                                     Value          Reference  Aortic root ID, ED                        38    mm       ---------  Ascending aorta ID, A-P, S                33    mm       ---------    Left atrium                               Value          Reference  LA ID, A-P, ES                            41    mm       ---------  LA ID/bsa, A-P                            2.04  cm/m^2   <=2.2  LA volume, S                              86    ml       ---------  LA volume/bsa, S                          42.7  ml/m^2   ---------  LA volume, ES, 1-p A4C                    70    ml       ---------  LA volume/bsa, ES, 1-p A4C                34.8  ml/m^2   ---------  LA volume, ES, 1-p A2C                    103   ml       ---------  LA volume/bsa, ES, 1-p A2C                51.2  ml/m^2   ---------    Mitral valve                              Value          Reference  Mitral E-wave peak velocity               124   cm/s      ---------  Mitral deceleration time                  155   ms       150 - 230  Mitral peak gradient, D  6     mm Hg    ---------    Pulmonary arteries                        Value          Reference  PA pressure, S, DP                (H)     38    mm Hg    <=30    Tricuspid valve                           Value          Reference  Tricuspid regurg peak velocity            297   cm/s     ---------  Tricuspid peak RV-RA gradient             35    mm Hg    ---------    Systemic veins                            Value          Reference  Estimated CVP                             3     mm Hg    ---------    Right ventricle                           Value          Reference  RV pressure, S, DP                (H)     38    mm Hg    <=30  RV s&', lateral, S                         9.85  cm/s     ---------  Legend: (L)  and  (H)  mark values outside specified reference range.  ------------------------------------------------------------------- Prepared and Electronically Authenticated by  Candee Furbish, M.D. 2019-08-19T11:13:55    RIGHT/LEFT HEART CATH AND CORONARY/GRAFT ANGIOGRAPHY  Conclusion     Ost 1st Diag to 1st Diag lesion is 50% stenosed.  Prox LAD to Mid LAD lesion is 40% stenosed.  Ost RCA lesion is 100% stenosed.  SVG and is normal in caliber and large.  SVG and is large.  Ost Cx to Prox Cx lesion is 100% stenosed.  Ost LAD to Prox LAD lesion is 40% stenosed.   Findings:  Ao =109/68 (86) LV = 117/29 RA = 19 RV = 54/17 PA = 62/21 (42) PCW = 40 Fick cardiac output/index = 3.5/1.78 PVR = 0.6 WU FA sat = 96% PA sat = 58%, 61% PAPi = 2.2 RA/PCWP = 0.475 CPO = 0.67 Av gradient: peak 15.0 mmHG  Mean 14.43mmHG  AVA = 0.89 cm2  Assessment: 1. Severe 2v CAD with total occlusion of ostial RCA and LCx 2. Moderate non-obstructive CAD in LAD 3. EF 30% by echo (no v-gram done due to high LVEDP) 4. Severe low gradient AS with reduced cardiac  output and markedly elevated filling pressures  Plan/Discussion:  1) Continue diuresis - I increased lasix  to 80 IVT tid. Cut lopressor to 50 daily with low output 2) Continue with TAVR w/up. D/w Drs. Ellyn Hack and Wonda Cheng, MD  10:04 AM     Indications   Severe aortic stenosis [I35.0 (ICD-10-CM)]  CAD in native artery [I25.10 (ICD-10-CM)]  Procedural Details/Technique   Technical Details The risks and indication of the procedure were explained. Consent was signed and placed on the chart. An appropriate timeout was taken prior to the procedure.   The right AC fossa was prepped and draped in the routine sterile fashion and anesthetized with 1% local lidocaine. The pre-existing PIV in the right [facility] was exchanged over a wire for a 5 FR venous sheath using a modified Seldinger technique. A standard Swan-Ganz catheter was used for the procedure.   After a normal Allen's test was confirmed, the right wrist was prepped and draped in the routine sterile fashion and anesthetized with 1% local lidocaine. A 5 FR arterial sheath was then placed in the right radial artery using a modified Seldinger technique. 3mg  IV verapamil was given through the sheath. Systemic heparin was administered. Standard catheters including a JL 3.5 and a JR 4 were used. All catheter exchanges were made over a wire.   Estimated blood loss <50 mL.  During this procedure the patient was administered the following to achieve and maintain moderate conscious sedation: Versed 1 mg, Fentanyl 25 mcg, while the patient's heart rate, blood pressure, and oxygen saturation were continuously monitored. The period of conscious sedation was 29 minutes, of which I was present face-to-face 100% of this time.  Coronary Findings   Diagnostic  Dominance: Right  Left Main  Vessel is large.  Left Anterior Descending  There is mild diffuse disease throughout the vessel.  Ost LAD to Prox LAD lesion 40% stenosed  Ost  LAD to Prox LAD lesion is 40% stenosed.  Prox LAD to Mid LAD lesion 40% stenosed  Prox LAD to Mid LAD lesion is 40% stenosed. The lesion is eccentric. The lesion is mildly calcified.  First Diagonal Branch  Vessel is moderate in size.  Ost 1st Diag to 1st Diag lesion 50% stenosed  Ost 1st Diag to 1st Diag lesion is 50% stenosed. The lesion is located at the major branch and discrete.  First Septal Branch  Vessel is small in size.  Second Diagonal Branch  Vessel is moderate in size. Vessel is angiographically normal.  Second Septal Branch  Vessel is small in size.  Third Diagonal Branch  Vessel is small in size.  Third Septal Branch  Vessel is small in size.  Left Circumflex  Vessel is moderate in size.  Ost Cx to Prox Cx lesion 100% stenosed  Ost Cx to Prox Cx lesion is 100% stenosed.  First Obtuse Marginal Branch  Vessel is normal in size. Vessel is angiographically normal. Sleep terminates with 3 small-moderate distal branches  Lateral First Obtuse Marginal Branch  Vessel is moderate in size. Vessel is angiographically normal.  Second Obtuse Marginal Branch  Vessel is small in size.  Right Coronary Artery  Vessel is moderate in size.  Ost RCA lesion 100% stenosed  Ost RCA lesion is 100% stenosed. The lesion is chronically occluded.  Acute Marginal Branch  Vessel is small in size.  Inferior Septal  Vessel is small in size.  Right Posterior Atrioventricular Branch  Vessel is moderate in size.  First Right Posterolateral  Vessel is small in size.  saphenous Graft to RPDA  SVG and is normal in  caliber and large.  saphenous Graft to 1st Mrg  SVG and is large.  Intervention   No interventions have been documented.  Coronary Diagrams   Diagnostic Diagram       Implants    No implant documentation for this case.  MERGE Images   Show images for CARDIAC CATHETERIZATION   Link to Procedure Log   Procedure Log    Hemo Data    Most Recent Value  Fick Cardiac  Output 3.53 L/min  Fick Cardiac Output Index 1.78 (L/min)/BSA  Aortic Mean Gradient 14.35 mmHg  Aortic Peak Gradient 15 mmHg  Aortic Valve Area 0.89  Aortic Value Area Index 0.45 cm2/BSA  RA A Wave 22 mmHg  RA V Wave 21 mmHg  RA Mean 19 mmHg  RV Systolic Pressure 54 mmHg  RV Diastolic Pressure 7 mmHg  RV EDP 17 mmHg  PA Systolic Pressure 62 mmHg  PA Diastolic Pressure 21 mmHg  PA Mean 42 mmHg  PW A Wave 35 mmHg  PW V Wave 43 mmHg  PW Mean 40 mmHg  AO Systolic Pressure 962 mmHg  AO Diastolic Pressure 68 mmHg  AO Mean 86 mmHg  LV Systolic Pressure 836 mmHg  LV Diastolic Pressure 14 mmHg  LV EDP 29 mmHg  AOp Systolic Pressure 629 mmHg  AOp Diastolic Pressure 69 mmHg  AOp Mean Pressure 85 mmHg  LVp Systolic Pressure 476 mmHg  LVp Diastolic Pressure 9 mmHg  LVp EDP Pressure 24 mmHg  QP/QS 1  TPVR Index 23.66 HRUI  TSVR Index 48.45 HRUI  PVR SVR Ratio 0.03  TPVR/TSVR Ratio 0.49    Cardiac TAVR CT  TECHNIQUE: The patient was scanned on a [employer]. A 120 kV retrospective scan was triggered in the descending thoracic aorta at 111 HU's. Gantry rotation speed was 250 msecs and collimation was .6 mm. No beta blockade or nitro were given. The 3D data set was reconstructed in 5% intervals of the R-R cycle. Systolic and diastolic phases were analyzed on a dedicated work station using MPR, MIP and VRT modes. The patient received 80 cc of contrast.  FINDINGS: Aortic Valve: Trileaflet aortic valve with severely thickened leaflets, moderate calcifications and mild calcifications extending into the LVOT under the left and non-coronary cusps.  Aorta: Normal size with mild diffuse calcifications and no dissection.  Sinotubular Junction: 33 x 29 mm  Ascending Thoracic Aorta:  36 x 33 mm  Aortic Arch: 27 x 25 mm  Descending Thoracic Aorta: 25 x 24 mm  Sinus of Valsalva Measurements:  Non-coronary: 35 mm  Right -coronary: 37 mm  Left -coronary: 37  mm  Coronary Artery Height above Annulus:  Left Main: 14 mm  Right Coronary: 18 mm  Virtual Basal Annulus Measurements:  Maximum/Minimum Diameter: 31.8 x 24.1 mm  Mean Diameter: 27.5 mm  Perimeter: 90.2 mm  Area: 595 mm2  Optimum Fluoroscopic Angle for Delivery: LAO 6 CAU 7  IMPRESSION: 1. Trileaflet aortic valve with severely thickened leaflets, moderate calcifications and mild calcifications extending into the LVOT under the left and non-coronary cusps. Annular measurements suitable for delivery of a 29 mm Edwards-SAPIEN 3 valve.  2. Sufficient coronary to annulus distance.  3. Optimum Fluoroscopic Angle for Delivery:  LAO 6 CAU 7  4. No thrombus in the left atrial appendage.   Electronically Signed   By: Ena Dawley   On: 04/26/2018 20:04   CT ANGIOGRAPHY CHEST, ABDOMEN AND PELVIS  TECHNIQUE: Multidetector CT imaging through the chest, abdomen and pelvis  was performed using the standard protocol during bolus administration of intravenous contrast. Multiplanar reconstructed images and MIPs were obtained and reviewed to evaluate the vascular anatomy.  CONTRAST:  181mL ISOVUE-370 IOPAMIDOL (ISOVUE-370) INJECTION 76%  COMPARISON:  Chest CT 12/31/2012. CT the abdomen and pelvis 07/04/2010.  FINDINGS: CTA CHEST FINDINGS  Cardiovascular: Heart size is mildly enlarged. There is no significant pericardial fluid, thickening or pericardial calcification. There is aortic atherosclerosis, as well as atherosclerosis of the great vessels of the mediastinum and the coronary arteries, including calcified atherosclerotic plaque in the left main, left anterior descending, left circumflex and right coronary arteries. Status post median sternotomy for CABG. Severe thickening calcification of the aortic valve. Calcifications of the anterior leaflet of the mitral valve.  Mediastinum/Lymph Nodes: No pathologically enlarged mediastinal or hilar  lymph nodes. Esophagus is unremarkable in appearance. No axillary lymphadenopathy.  Lungs/Pleura: Small bilateral pleural effusions (left greater than right) lying dependently. 5 mm subpleural nodule in the periphery of the right lower lobe abutting the minor fissure (axial image 48 of series 7), unchanged dating back to 2014, considered definitively benign (presumably a subpleural lymph noted). No other suspicious appearing pulmonary nodules or masses are noted. Some mild dependent subsegmental atelectasis is noted in the lower lobes of the lungs bilaterally. Small focus of nodular appearing probable airspace consolidation in the superior segment of the left lower lobe, concerning for pneumonia (axial image 46 of series 7).  Musculoskeletal/Soft Tissues: Median sternotomy wires. There are no aggressive appearing lytic or blastic lesions noted in the visualized portions of the skeleton.  CTA ABDOMEN AND PELVIS FINDINGS  Hepatobiliary: 9 mm hypervascular lesion in segment 8 of the liver (axial image 90 of series 6). No other definite cystic or solid hepatic lesions. No intra or extrahepatic biliary ductal dilatation. Gallbladder is normal in appearance.  Pancreas: No pancreatic mass. No pancreatic ductal dilatation. No pancreatic or peripancreatic fluid or inflammatory changes.  Spleen: Unremarkable.  Adrenals/Urinary Tract: 8 mm nonobstructive calculus in the lower pole collecting system of the right kidney. A few tiny subcentimeter low-attenuation lesions in the kidneys bilaterally are too small to definitively characterize, but are statistically favored to represent tiny cysts. No suspicious renal lesions. No hydroureteronephrosis. Bilateral adrenal glands are normal in appearance. Urinary bladder is normal in appearance.  Stomach/Bowel: Normal appearance of the stomach. No pathologic dilatation of small bowel or colon. Very large volume of stool throughout the  colon, suggesting constipation. Normal appendix.  Vascular/Lymphatic: Aortic atherosclerosis with vascular findings and measurements pertinent to potential TAVR procedure, as detailed below. No aneurysm or dissection noted in the abdominal or pelvic vasculature. No lymphadenopathy noted in the abdomen or pelvis.  Reproductive: Prostate gland and seminal vesicles are unremarkable in appearance.  Other: No significant volume of ascites.  No pneumoperitoneum.  Musculoskeletal: There are no aggressive appearing lytic or blastic lesions noted in the visualized portions of the skeleton.  VASCULAR MEASUREMENTS PERTINENT TO TAVR:  AORTA:  Minimal Aortic Diameter-16 x 17 mm  Severity of Aortic Calcification-mild-to-moderate  RIGHT PELVIS:  Right Common Iliac Artery -  Minimal Diameter-11.6 x 9.9 mm  Tortuosity-mild  Calcification-moderate  Right External Iliac Artery -  Minimal Diameter-7.8 x 8.1 mm  Tortuosity - mild  Calcification - mild  Right Common Femoral Artery -  Minimal Diameter-8.8 x 8.5 mm  Tortuosity - mild  Calcification - mild  LEFT PELVIS:  Left Common Iliac Artery -  Minimal Diameter-10.6 x 10.2 mm  Tortuosity - mild  Calcification-mild  Left External Iliac  Artery -  Minimal Diameter-7.3 x 8.7 mm  Tortuosity - mild  Calcification - mild  Left Common Femoral Artery -  Minimal Diameter-8.1 x 8.8 mm  Tortuosity - mild  Calcification - mild  Review of the MIP images confirms the above findings.  IMPRESSION: 1. Vascular findings and measurements pertinent to potential TAVR procedure, as detailed above. 2. Severe thickening calcification of the aortic valve, compatible with the reported clinical history of severe aortic stenosis. Nodular focus of what appears to be airspace consolidation in the superior segment of the left lower lobe, concerning for a small focus of pneumonia. The possibility of a  true pulmonary nodule in this region is not entirely excluded, but is not favored. Further clinical evaluation is recommended, as well as follow-up noncontrast chest CT in the 2-3 months to ensure resolution of this finding. 3. Aortic atherosclerosis, in addition to left main and 3 vessel coronary artery disease. Status post median sternotomy for CABG. 4. Small bilateral pleural effusions (left greater than right) lying dependently. 5. Mild cardiomegaly. 6. Small hypervascular lesion measuring 9 mm in segment 8 of the liver. This is favored to be a benign perfusion anomaly or potentially a flash fill cavernous hemangioma. This could be definitively characterized with nonemergent MRI of the abdomen with and without IV gadolinium if of clinical concern. 7. There nonobstructive calculus in the lower pole collecting system of the right kidney. 8. Large volume of stool throughout the colon suggesting constipation.  Aortic Atherosclerosis (ICD10-I70.0).   Electronically Signed   By: Vinnie Langton M.D.   On: 04/26/2018 13:59    Impression:  Patient has stage the severe symptomatic low gradient low ejection fraction aortic stenosis.  He presents with progressive symptoms of exertional shortness of breath, orthopnea, lower extremity edema, PND, and a dry nonproductive cough with recent hospitalization for acute exacerbation of chronic combined systolic and diastolic congestive heart failure.  He responded to medical therapy and has remained stable since hospital discharge with class II symptoms of exertional shortness of breath.  I have personally reviewed the patient's recent transthoracic echocardiogram, diagnostic cardiac catheterization, and CT angiograms.  Echocardiogram demonstrates the presence of severe global left ventricular systolic dysfunction with ejection fraction estimated 30%.  The aortic valve is trileaflet with severe thickening, calcification, and restricted leaflet  mobility involving all 3 leaflets.  Peak velocity across the aortic valve was measured as high as 2.9 to 3.0 m/s corresponding to mean transvalvular gradient estimated 20 mmHg.  The DVI was 0.22 with aortic valve area calculated 1.02 m/s.  The patient also has moderate aortic insufficiency.  Diagnostic cardiac catheterization revealed severe two-vessel coronary artery disease with chronic total occlusion of the right coronary artery and the left circumflex coronary artery.  There was continued patency of the saphenous vein graft to both the obtuse marginal branch of left circumflex and the posterior descending coronary artery off of the distal right coronary artery.  There was moderate nonobstructive disease in the left anterior descending coronary artery.  There was mild to moderate pulmonary hypertension with relatively low cardiac output.  By catheterization the aortic valve area was calculated 0.89 cm.   I agree the patient would benefit from aortic valve replacement.  I would not consider this patient a candidate for conventional surgery because of his 2 previous median sternotomies and history of radiation therapy to the mediastinum.  Cardiac-gated CTA of the heart reveals anatomical characteristics consistent with aortic stenosis suitable for treatment by transcatheter aortic valve replacement without any  significant complicating features, although there is some calcification in the aortic annulus which might slightly increase the risk of paravalvular leak.  CTA of the aorta and iliac vessels demonstrate what appears to be adequate pelvic vascular access to facilitate a transfemoral approach.    Plan:  The patient and his wife were counseled at length regarding treatment alternatives for management of severe symptomatic aortic stenosis. Alternative approaches such as conventional aortic valve replacement, transcatheter aortic valve replacement, and continued medical therapy without intervention were  compared and contrasted at length.  The risks associated with conventional surgical aortic valve replacement were discussed in detail, as were expectations for post-operative convalescence, and why I would be reluctant to consider this patient a candidate for conventional surgery.  Issues specific to transcatheter aortic valve replacement were discussed including questions about long term valve durability, the potential for paravalvular leak, possible increased risk of need for permanent pacemaker placement, and other technical complications related to the procedure itself.  Long-term prognosis with medical therapy was discussed. This discussion was placed in the context of the patient's own specific clinical presentation and past medical history.  All of their questions have been addressed.  The patient hopes to proceed with transcatheter aortic valve replacement as soon as practical.  We tentatively plan to proceed with surgery on May 15, 2018.  Following the decision to proceed with transcatheter aortic valve replacement, a discussion has been held regarding what types of management strategies would be attempted intraoperatively in the event of life-threatening complications, including whether or not the patient would be considered a candidate for the use of cardiopulmonary bypass and/or conversion to open sternotomy for attempted surgical intervention.  The patient has been advised of a variety of complications that might develop including but not limited to risks of death, stroke, paravalvular leak, aortic dissection or other major vascular complications, aortic annulus rupture, device embolization, cardiac rupture or perforation, mitral regurgitation, acute myocardial infarction, arrhythmia, heart block or bradycardia requiring permanent pacemaker placement, congestive heart failure, respiratory failure, renal failure, pneumonia, infection, other late complications related to structural valve  deterioration or migration, or other complications that might ultimately cause a temporary or permanent loss of functional independence or other long term morbidity.  The patient provides full informed consent for the procedure as described and all questions were answered.   I spent in excess of 90 minutes during the conduct of this office consultation and >50% of this time involved direct face-to-face encounter with the patient for counseling and/or coordination of their care.    Valentina Gu. Roxy Manns, MD 05/01/2018 12:23 PM

## 2018-05-01 NOTE — Progress Notes (Signed)
[facility]  MULTIDISCIPLINARY HEART VALVE CLINIC  CARDIOTHORACIC SURGERY CONSULTATION REPORT  Referring Provider is [name], MD PCP is [name], MD  Chief Complaint  Patient presents with  . Aortic Stenosis    new patient consultation Cath 04/13/18, CT 04/26/18    HPI:  Patient is a 67 year old male with history of mediastinal seminoma treated with surgical resection followed by radiation therapy, coronary artery disease status post coronary artery bypass grafting x2 in 2014, chronic combined systolic and diastolic congestive heart failure with recent acute exacerbation, aortic stenosis, previous stroke, hypertension, type 2 diabetes mellitus, stage III chronic kidney disease, and hyperlipidemia who has been referred for surgical consultation to discuss treatment options for management of low gradient low ejection fraction severe symptomatic aortic stenosis.  Patient had history of mediastinal seminoma for which she underwent median sternotomy for surgical resection in 1985.  Postoperatively he was treated with mediastinal radiation therapy.  In 2013 he presented with an acute non-ST segment elevation myocardial infarction.  He was found to have multivessel coronary artery disease and treated with PCI and stenting of the left circumflex and obtuse marginal branch.  Within a year he developed critical in-stent restenosis and he subsequently underwent coronary artery bypass grafting x2 by Dr. Prescott Gum.  Grafts placed at the time of surgery included reverse saphenous vein graft to the obtuse marginal branch of left circumflex into the posterior descending branch of the distal right coronary artery.  Both the right internal mammary artery and the left radial artery were harvested at the time of surgery but felt to be poor quality and not utilized.  The patient was also noted to have severe chronic scarring throughout the mediastinum related to previous surgery and  mediastinal radiation therapy.  Patient did well following coronary artery bypass surgery and has been followed closely ever since by Dr. Radford Pax.  Echocardiograms have documented the presence of aortic stenosis with moderate to severe left ventricular systolic dysfunction with ejection fraction estimated 30 to 35%.  Transvalvular gradients across the aortic valve have suggested the presence of at least moderate aortic stenosis with mean transvalvular gradient estimated 20 mmHg, DVI reported 0.22 and aortic valve area calculated 1.02 cm.  Over the past few months the patient developed fairly rapid progression of symptoms of exertional shortness of breath, weight gain, orthopnea, PND, and lower extremity edema.  He was hospitalized in August with acute exacerbation of congestive heart failure.  He responded to IV diuresis.  He was evaluated in consultation by Dr. Angelena Form and underwent diagnostic cardiac catheterization by Dr. Haroldine Laws on June 13, 2018.  Catheterization revealed moderate nonobstructive disease in the left anterior descending coronary artery with continued patency of the vein grafts to the left circumflex and right coronary territories.  The patient stabilized medically and was discharged home.  He has undergone CT angiography and has now been referred for surgical consultation.  Patient is married and lives in [place] with his wife who is a retired Marine scientist.  The patient continues to work as a Presenter, broadcasting on the night shift.  He states that up until 6 months ago he was getting around fairly well.  His job requires a fair amount of walking but no strenuous exertion.  He notes that he first began to experience exertional shortness of breath approximately 4 months ago.  Symptoms progressed fairly rapidly until the time of his admission in August.  Since hospital discharge he has remained stable, but he continues to  get short of breath with moderate level activity.  At present he  denies resting shortness of breath, PND, orthopnea, or lower extremity edema.  His weight has been stable.  He has never had any exertional chest pain or chest tightness.  Past Medical History:  Diagnosis Date  . Acute on chronic combined systolic and diastolic CHF (congestive heart failure) ([place]) 04/16/2018  . Aortic stenosis   . Cancer [facility])    mediastinal seminoma resected 1985 - benign  . Chronic kidney disease   . Coronary artery disease 02/2012   a. s/p stenting in 2013  b. s/p CABGx2V (SVG--> PDA, SVG--> LCx).  . Diabetes mellitus    neuropathy  insulin dependent  . Dyslipidemia   . Erectile dysfunction   . GERD (gastroesophageal reflux disease)   . Hx of radiation therapy to mediastinum 1985  . Hypertension   . Malignant seminoma of mediastinum ([place]) 1985  . Stroke [facility])    TIA's    Past Surgical History:  Procedure Laterality Date  . COLONOSCOPY  07/25/2012   Procedure: COLONOSCOPY;  Surgeon: Winfield Cunas., MD;  Location: [facility] ENDOSCOPY;  Service: Endoscopy;  Laterality: N/A;  . COLONOSCOPY N/A 12/02/2013   Procedure: COLONOSCOPY;  Surgeon: Winfield Cunas., MD;  Location: [facility] ENDOSCOPY;  Service: Endoscopy;  Laterality: N/A;  . CORONARY ARTERY BYPASS GRAFT N/A 01/04/2013   Procedure: CORONARY ARTERY BYPASS GRAFTING (CABG) times two using right saphenous vein harvested with endoscope.;  Surgeon: Ivin Poot, MD;  Location: [place];  Service: Open Heart Surgery;  Laterality: N/A;  . ESOPHAGOGASTRODUODENOSCOPY  07/20/2012   Procedure: ESOPHAGOGASTRODUODENOSCOPY (EGD);  Surgeon: Winfield Cunas., MD;  Location: [facility] ENDOSCOPY;  Service: Endoscopy;  Laterality: N/A;  . ESOPHAGOGASTRODUODENOSCOPY N/A 12/02/2013   Procedure: ESOPHAGOGASTRODUODENOSCOPY (EGD);  Surgeon: Winfield Cunas., MD;  Location: [facility] ENDOSCOPY;  Service: Endoscopy;  Laterality: N/A;  . HOT HEMOSTASIS N/A 12/02/2013   Procedure: HOT HEMOSTASIS (ARGON PLASMA COAGULATION/BICAP);  Surgeon: Winfield Cunas., MD;  Location: [facility] ENDOSCOPY;  Service: Endoscopy;  Laterality: N/A;  . INTRAOPERATIVE TRANSESOPHAGEAL ECHOCARDIOGRAM N/A 01/04/2013   Procedure: INTRAOPERATIVE TRANSESOPHAGEAL ECHOCARDIOGRAM;  Surgeon: Ivin Poot, MD;  Location: [place];  Service: Open Heart Surgery;  Laterality: N/A;  . LEFT HEART CATHETERIZATION WITH CORONARY ANGIOGRAM N/A 03/06/2012   Procedure: LEFT HEART CATHETERIZATION WITH CORONARY ANGIOGRAM;  Surgeon: [name], MD;  Location: [place] CATH LAB;  Service: Cardiovascular;  Laterality: N/A;  . lithotripsy    . MEDIAN STERNOTOMY  1985  . RADIAL ARTERY HARVEST Left 01/04/2013   Procedure: RADIAL ARTERY HARVEST;  Surgeon: Ivin Poot, MD;  Location: [place];  Service: Vascular;  Laterality: Left;  Artery not havested. Unsuitable for use.  . resection mediastinal seminonma    . RIGHT/LEFT HEART CATH AND CORONARY/GRAFT ANGIOGRAPHY N/A 11/15/2016   Procedure: Right/Left Heart Cath and Coronary/Graft Angiography;  Surgeon: Leonie Man, MD;  Location: [place] CV LAB;  Service: Cardiovascular;  Laterality: N/A;  . RIGHT/LEFT HEART CATH AND CORONARY/GRAFT ANGIOGRAPHY N/A 04/13/2018   Procedure: RIGHT/LEFT HEART CATH AND CORONARY/GRAFT ANGIOGRAPHY;  Surgeon: Jolaine Artist, MD;  Location: [place] CV LAB;  Service: Cardiovascular;  Laterality: N/A;    Family History  Problem Relation Age of Onset  . Diabetes Father   . Diabetes Mother   . Heart failure Mother   . Hypertension Mother   . Cancer Brother   . Diabetes Sister     Social History  Socioeconomic History  . Marital status: Married    Spouse name: Not on file  . Number of children: Not on file  . Years of education: Not on file  . Highest education level: Not on file  Occupational History  . Not on file  Social Needs  . Financial resource strain: Not on file  . Food insecurity:    Worry: Not on file    Inability: Not on file  . Transportation needs:    Medical: Not on file     Non-medical: Not on file  Tobacco Use  . Smoking status: Former Smoker    Last attempt to quit: 03/06/1985    Years since quitting: 33.1  . Smokeless tobacco: Never Used  Substance and Sexual Activity  . Alcohol use: No  . Drug use: No  . Sexual activity: Not Currently  Lifestyle  . Physical activity:    Days per week: Not on file    Minutes per session: Not on file  . Stress: Not on file  Relationships  . Social connections:    Talks on phone: Not on file    Gets together: Not on file    Attends religious service: Not on file    Active member of club or organization: Not on file    Attends meetings of clubs or organizations: Not on file    Relationship status: Not on file  . Intimate partner violence:    Fear of current or ex partner: Not on file    Emotionally abused: Not on file    Physically abused: Not on file    Forced sexual activity: Not on file  Other Topics Concern  . Not on file  Social History Narrative  . Not on file    Current Outpatient Medications  Medication Sig Dispense Refill  . amitriptyline (ELAVIL) 100 MG tablet Take 100 mg by mouth daily.     . B Complex-C (SUPER B COMPLEX PO) Take 1 tablet by mouth daily.    . B-D ULTRAFINE III SHORT PEN 31G X 8 MM MISC 1 each by Other route daily as needed (GLUCOSE TESTING (INSULINE NEEDLE)).     Marland Kitchen HUMALOG KWIKPEN 100 UNIT/ML KiwkPen Per pump;  0.8 unit(s)/per hour average and patient gives a bolus, if needed    . hydroxypropyl methylcellulose / hypromellose (ISOPTO TEARS / GONIOVISC) 2.5 % ophthalmic solution Place 1 drop into both eyes 4 (four) times daily as needed for dry eyes.    Marland Kitchen LEVEMIR FLEXTOUCH 100 UNIT/ML Pen Inject 30 Units into the skin daily after breakfast.   5  . levothyroxine (SYNTHROID, LEVOTHROID) 50 MCG tablet Take 50 mcg by mouth daily before breakfast.    . metFORMIN (GLUCOPHAGE) 500 MG tablet Take 1,000 mg by mouth 2 (two) times daily with a meal.    . metoprolol succinate (TOPROL-XL) 50 MG 24  hr tablet Take 1 tablet (50 mg total) by mouth daily. Take with or immediately following a meal. 30 tablet 11  . Multiple Vitamin (MULTIVITAMIN) tablet Take 1 tablet by mouth daily.    . ONE TOUCH ULTRA TEST test strip 1 each by Other route as needed for other.     . simvastatin (ZOCOR) 40 MG tablet Take 40 mg by mouth every evening.    . zolpidem (AMBIEN) 10 MG tablet Take 10 mg by mouth daily.      No current facility-administered medications for this visit.     Allergies  Allergen Reactions  . Lipitor [Atorvastatin] Other (  See Comments)    Muscle pain  . Adhesive [Tape] Rash      Review of Systems:   General:  normal appetite, normal energy, + weight gain, + weight loss, no fever  Cardiac:  no chest pain with exertion, no chest pain at rest, +SOB with exertion, no current resting SOB, no current PND, + orthopnea, no palpitations, no arrhythmia, no atrial fibrillation, + LE edema, no dizzy spells, no syncope  Respiratory:  + exertional shortness of breath, no home oxygen, no productive cough, + dry cough, no bronchitis, no wheezing, no hemoptysis, no asthma, no pain with inspiration or cough, no sleep apnea, no CPAP at night  GI:   no difficulty swallowing, no reflux, no frequent heartburn, no hiatal hernia, no abdominal pain, no constipation, no diarrhea, no hematochezia, no hematemesis, no melena  GU:   no dysuria,  no frequency, no urinary tract infection, no hematuria, no enlarged prostate, no kidney stones, + kidney disease  Vascular:  no pain suggestive of claudication, no pain in feet, no leg cramps, no varicose veins, no DVT, no non-healing foot ulcer  Neuro:   + stroke, no TIA's, no seizures, no headaches, no temporary blindness one eye,  no slurred speech, no peripheral neuropathy, no chronic pain, no instability of gait, no memory/cognitive dysfunction  Musculoskeletal: no arthritis, no joint swelling, no myalgias, no difficulty walking, normal mobility   Skin:   no rash, no  itching, no skin infections, no pressure sores or ulcerations  Psych:   no anxiety, no depression, no nervousness, no unusual recent stress  Eyes:   no blurry vision, no floaters, no recent vision changes, no wears glasses or contacts  ENT:   no hearing loss, no loose or painful teeth, no dentures, last saw dentist within the past year  Hematologic:  no easy bruising, no abnormal bleeding, no clotting disorder, no frequent epistaxis  Endocrine:  + diabetes, does check CBG's at home           Physical Exam:   BP 118/70 (BP Location: Right Arm, Patient Position: Sitting, Cuff Size: Normal)   Pulse 85   Resp 18   Ht 5\' 11"  (1.803 m)   Wt 170 lb 6.4 oz (77.3 kg)   SpO2 97% Comment: RA  BMI 23.77 kg/m   General:  Thin,  well-appearing  HEENT:  Unremarkable   Neck:   no JVD, no bruits, no adenopathy   Chest:   clear to auscultation, symmetrical breath sounds, no wheezes, no rhonchi   CV:   RRR, grade III/VI crescendo/decrescendo murmur heard best at RSB,  no diastolic murmur  Abdomen:  soft, non-tender, no masses   Extremities:  warm, well-perfused, pulses palpable, no LE edema  Rectal/GU  Deferred  Neuro:   Grossly non-focal and symmetrical throughout  Skin:   Clean and dry, no rashes, no breakdown   Diagnostic Tests:  Echocardiography  Patient:    Jhase, Creppel MR #:       [id number] Study Date: 04/02/2018 Gender:     M Age:        60 Height:     180.3 cm Weight:     80.4 kg BSA:        2.01 m^2 Pt. Status: Room:   ORDERING     Fransico Him, MD  REFERRING    Fransico Him, MD  SONOGRAPHER  Oakley, Will  ATTENDING    Candee Furbish, M.D.  PERFORMING   [facility], Outpatient  cc:  -------------------------------------------------------------------  LV EF: 30% -   35%  ------------------------------------------------------------------- Indications:      (I25.10).  ------------------------------------------------------------------- History:   PMH:  Acquired from  the patient and from the patient&'s chart.  Coronary artery disease.  Aortic valve disease.  PMH: Myocardial infarction.  Risk factors:  Hypertension. Diabetes mellitus. Dyslipidemia.  ------------------------------------------------------------------- Study Conclusions  - Left ventricle: The cavity size was normal. There was mild focal   basal hypertrophy of the septum. Systolic function was moderately   to severely reduced. The estimated ejection fraction was in the   range of 30% to 35%. There is akinesis of the anteroseptal   myocardium. There was no evidence of elevated ventricular filling   pressure by Doppler parameters. - Aortic valve: Moderately calcified annulus. Trileaflet; severely   thickened, severely calcified leaflets. Valve mobility was   restricted. There was moderate stenosis. There was mild   regurgitation. Peak velocity (S): 293 cm/s. Mean gradient (S): 20   mm Hg. - Aorta: Mild advancement in aortic stenosis (prior mean gradient   63mmHg). Aortic root dimension: 38 mm (ED). - Ascending aorta: The ascending aorta was mildly dilated. - Mitral valve: There was mild regurgitation. - Left atrium: The atrium was moderately dilated. - Tricuspid valve: There was mild regurgitation. - Pulmonary arteries: Systolic pressure was mildly increased. PA   peak pressure: 38 mm Hg (S).  ------------------------------------------------------------------- Labs, prior tests, procedures, and surgery: Coronary artery bypass grafting.  ------------------------------------------------------------------- Study data:  Comparison was made to the study of 10/26/2016.  Study status:  Routine.  Procedure:  The patient reported no pain pre or post test. Transthoracic echocardiography for left ventricular function evaluation. Image quality was adequate.  Study completion:  There were no complications.          Echocardiography.  M-mode, complete 2D, spectral Doppler, and color Doppler.   Birthdate: Patient birthdate: 03/07/1951.  Age:  Patient is 67 yr old.  Sex: Gender: male.    BMI: 24.7 kg/m^2.  Blood pressure:     126/78 Patient status:  Outpatient.  Study date:  Study date: 04/02/2018. Study time: 07:53 AM.  Location:  [facility]  -------------------------------------------------------------------  ------------------------------------------------------------------- Left ventricle:  The cavity size was normal. There was mild focal basal hypertrophy of the septum. Systolic function was moderately to severely reduced. The estimated ejection fraction was in the range of 30% to 35%.  Regional wall motion abnormalities:   There is akinesis of the anteroseptal myocardium. There was no evidence of elevated ventricular filling pressure by Doppler parameters.  ------------------------------------------------------------------- Aortic valve:   Moderately calcified annulus. Trileaflet; severely thickened, severely calcified leaflets. Valve mobility was restricted.  Doppler:   There was moderate stenosis.   There was mild regurgitation.    VTI ratio of LVOT to aortic valve: 0.22. Valve area (VTI): 1.15 cm^2. Indexed valve area (VTI): 0.57 cm^2/m^2. Peak velocity ratio of LVOT to aortic valve: 0.19. Valve area (Vmax): 1.02 cm^2. Indexed valve area (Vmax): 0.51 cm^2/m^2. Mean velocity ratio of LVOT to aortic valve: 0.19. Valve area (Vmean): 1.02 cm^2. Indexed valve area (Vmean): 0.51 cm^2/m^2. Mean gradient (S): 20 mm Hg. Peak gradient (S): 34 mm Hg.  ------------------------------------------------------------------- Aorta:  Ascending aorta: The ascending aorta was mildly dilated.  ------------------------------------------------------------------- Mitral valve:   Moderately thickened, moderately calcified leaflets . Mobility was not restricted.  Doppler:  Transvalvular velocity was within the normal range. There was no evidence for stenosis. There was mild  regurgitation.    Peak gradient (D): 6 mm Hg.  -------------------------------------------------------------------  Left atrium:  The atrium was moderately dilated.  ------------------------------------------------------------------- Right ventricle:  The cavity size was normal. Wall thickness was normal. Systolic function was normal.  ------------------------------------------------------------------- Pulmonic valve:    Doppler:  Transvalvular velocity was within the normal range. There was no evidence for stenosis.  ------------------------------------------------------------------- Tricuspid valve:   Structurally normal valve.    Doppler: Transvalvular velocity was within the normal range. There was mild regurgitation.  ------------------------------------------------------------------- Pulmonary artery:   The main pulmonary artery was normal-sized. Systolic pressure was mildly increased.  ------------------------------------------------------------------- Right atrium:  The atrium was normal in size.  ------------------------------------------------------------------- Pericardium:  There was no pericardial effusion.  ------------------------------------------------------------------- Systemic veins: Inferior vena cava: The vessel was normal in size.  ------------------------------------------------------------------- Measurements   Left ventricle                            Value          Reference  LV ID, ED, PLAX chordal           (L)     42.7  mm       43 - 52  LV ID, ES, PLAX chordal                   35.6  mm       23 - 38  LV fx shortening, PLAX chordal    (L)     17    %        >=29  LV PW thickness, ED                       11.3  mm       ---------  IVS/LV PW ratio, ED               (H)     1.33           <=1.3  Stroke volume, 2D                         68    ml       ---------  Stroke volume/bsa, 2D                     34    ml/m^2   ---------  LV  ejection fraction, 1-p A4C             35    %        ---------  LV end-diastolic volume, 2-p              129   ml       ---------  LV end-systolic volume, 2-p               84    ml       ---------  LV ejection fraction, 2-p                 35    %        ---------  Stroke volume, 2-p                        45    ml       ---------  LV end-diastolic volume/bsa, 2-p          64    ml/m^2   ---------  LV end-systolic volume/bsa, 2-p  42    ml/m^2   ---------  Stroke volume/bsa, 2-p                    22.4  ml/m^2   ---------  LV e&', lateral                            9.46  cm/s     ---------  LV E/e&', lateral                          13.11          ---------  LV e&', medial                             8.58  cm/s     ---------  LV E/e&', medial                           14.45          ---------  LV e&', average                            9.02  cm/s     ---------  LV E/e&', average                          13.75          ---------    Ventricular septum                        Value          Reference  IVS thickness, ED                         15    mm       ---------    LVOT                                      Value          Reference  LVOT ID, S                                26    mm       ---------  LVOT area                                 5.31  cm^2     ---------  LVOT ID                                   26    mm       ---------  LVOT peak velocity, S                     56.4  cm/s     ---------  LVOT mean velocity, S  39.2  cm/s     ---------  LVOT VTI, S                               12.8  cm       ---------  LVOT peak gradient, S                     1     mm Hg    ---------  Stroke volume (SV), LVOT DP               68    ml       ---------  Stroke index (SV/bsa), LVOT DP            33.8  ml/m^2   ---------    Aortic valve                              Value          Reference  Aortic valve peak velocity, S             293   cm/s     ---------   Aortic valve mean velocity, S             204   cm/s     ---------  Aortic valve VTI, S                       59.2  cm       ---------  Aortic mean gradient, S                   20    mm Hg    ---------  Aortic peak gradient, S                   34    mm Hg    ---------  VTI ratio, LVOT/AV                        0.22           ---------  Aortic valve area, VTI                    1.15  cm^2     ---------  Aortic valve area/bsa, VTI                0.57  cm^2/m^2 ---------  Velocity ratio, peak, LVOT/AV             0.19           ---------  Aortic valve area, peak velocity          1.02  cm^2     ---------  Aortic valve area/bsa, peak               0.51  cm^2/m^2 ---------  velocity  Velocity ratio, mean, LVOT/AV             0.19           ---------  Aortic valve area, mean velocity          1.02  cm^2     ---------  Aortic valve area/bsa, mean               0.51  cm^2/m^2 ---------  velocity  Aortic regurg pressure half-time  241   ms       ---------    Aorta                                     Value          Reference  Aortic root ID, ED                        38    mm       ---------  Ascending aorta ID, A-P, S                33    mm       ---------    Left atrium                               Value          Reference  LA ID, A-P, ES                            41    mm       ---------  LA ID/bsa, A-P                            2.04  cm/m^2   <=2.2  LA volume, S                              86    ml       ---------  LA volume/bsa, S                          42.7  ml/m^2   ---------  LA volume, ES, 1-p A4C                    70    ml       ---------  LA volume/bsa, ES, 1-p A4C                34.8  ml/m^2   ---------  LA volume, ES, 1-p A2C                    103   ml       ---------  LA volume/bsa, ES, 1-p A2C                51.2  ml/m^2   ---------    Mitral valve                              Value          Reference  Mitral E-wave peak velocity               124   cm/s      ---------  Mitral deceleration time                  155   ms       150 - 230  Mitral peak gradient, D  6     mm Hg    ---------    Pulmonary arteries                        Value          Reference  PA pressure, S, DP                (H)     38    mm Hg    <=30    Tricuspid valve                           Value          Reference  Tricuspid regurg peak velocity            297   cm/s     ---------  Tricuspid peak RV-RA gradient             35    mm Hg    ---------    Systemic veins                            Value          Reference  Estimated CVP                             3     mm Hg    ---------    Right ventricle                           Value          Reference  RV pressure, S, DP                (H)     38    mm Hg    <=30  RV s&', lateral, S                         9.85  cm/s     ---------  Legend: (L)  and  (H)  mark values outside specified reference range.  ------------------------------------------------------------------- Prepared and Electronically Authenticated by  Candee Furbish, M.D. 2019-08-19T11:13:55    RIGHT/LEFT HEART CATH AND CORONARY/GRAFT ANGIOGRAPHY  Conclusion     Ost 1st Diag to 1st Diag lesion is 50% stenosed.  Prox LAD to Mid LAD lesion is 40% stenosed.  Ost RCA lesion is 100% stenosed.  SVG and is normal in caliber and large.  SVG and is large.  Ost Cx to Prox Cx lesion is 100% stenosed.  Ost LAD to Prox LAD lesion is 40% stenosed.   Findings:  Ao =109/68 (86) LV = 117/29 RA = 19 RV = 54/17 PA = 62/21 (42) PCW = 40 Fick cardiac output/index = 3.5/1.78 PVR = 0.6 WU FA sat = 96% PA sat = 58%, 61% PAPi = 2.2 RA/PCWP = 0.475 CPO = 0.67 Av gradient: peak 15.0 mmHG  Mean 14.50mmHG  AVA = 0.89 cm2  Assessment: 1. Severe 2v CAD with total occlusion of ostial RCA and LCx 2. Moderate non-obstructive CAD in LAD 3. EF 30% by echo (no v-gram done due to high LVEDP) 4. Severe low gradient AS with reduced cardiac  output and markedly elevated filling pressures  Plan/Discussion:  1) Continue diuresis - I increased lasix  to 80 IVT tid. Cut lopressor to 50 daily with low output 2) Continue with TAVR w/up. D/w Drs. Ellyn Hack and Wonda Cheng, MD  10:04 AM     Indications   Severe aortic stenosis [I35.0 (ICD-10-CM)]  CAD in native artery [I25.10 (ICD-10-CM)]  Procedural Details/Technique   Technical Details The risks and indication of the procedure were explained. Consent was signed and placed on the chart. An appropriate timeout was taken prior to the procedure.   The right AC fossa was prepped and draped in the routine sterile fashion and anesthetized with 1% local lidocaine. The pre-existing PIV in the right [facility] was exchanged over a wire for a 5 FR venous sheath using a modified Seldinger technique. A standard Swan-Ganz catheter was used for the procedure.   After a normal Allen's test was confirmed, the right wrist was prepped and draped in the routine sterile fashion and anesthetized with 1% local lidocaine. A 5 FR arterial sheath was then placed in the right radial artery using a modified Seldinger technique. 3mg  IV verapamil was given through the sheath. Systemic heparin was administered. Standard catheters including a JL 3.5 and a JR 4 were used. All catheter exchanges were made over a wire.   Estimated blood loss <50 mL.  During this procedure the patient was administered the following to achieve and maintain moderate conscious sedation: Versed 1 mg, Fentanyl 25 mcg, while the patient's heart rate, blood pressure, and oxygen saturation were continuously monitored. The period of conscious sedation was 29 minutes, of which I was present face-to-face 100% of this time.  Coronary Findings   Diagnostic  Dominance: Right  Left Main  Vessel is large.  Left Anterior Descending  There is mild diffuse disease throughout the vessel.  Ost LAD to Prox LAD lesion 40% stenosed  Ost  LAD to Prox LAD lesion is 40% stenosed.  Prox LAD to Mid LAD lesion 40% stenosed  Prox LAD to Mid LAD lesion is 40% stenosed. The lesion is eccentric. The lesion is mildly calcified.  First Diagonal Branch  Vessel is moderate in size.  Ost 1st Diag to 1st Diag lesion 50% stenosed  Ost 1st Diag to 1st Diag lesion is 50% stenosed. The lesion is located at the major branch and discrete.  First Septal Branch  Vessel is small in size.  Second Diagonal Branch  Vessel is moderate in size. Vessel is angiographically normal.  Second Septal Branch  Vessel is small in size.  Third Diagonal Branch  Vessel is small in size.  Third Septal Branch  Vessel is small in size.  Left Circumflex  Vessel is moderate in size.  Ost Cx to Prox Cx lesion 100% stenosed  Ost Cx to Prox Cx lesion is 100% stenosed.  First Obtuse Marginal Branch  Vessel is normal in size. Vessel is angiographically normal. Sleep terminates with 3 small-moderate distal branches  Lateral First Obtuse Marginal Branch  Vessel is moderate in size. Vessel is angiographically normal.  Second Obtuse Marginal Branch  Vessel is small in size.  Right Coronary Artery  Vessel is moderate in size.  Ost RCA lesion 100% stenosed  Ost RCA lesion is 100% stenosed. The lesion is chronically occluded.  Acute Marginal Branch  Vessel is small in size.  Inferior Septal  Vessel is small in size.  Right Posterior Atrioventricular Branch  Vessel is moderate in size.  First Right Posterolateral  Vessel is small in size.  saphenous Graft to RPDA  SVG and is normal in  caliber and large.  saphenous Graft to 1st Mrg  SVG and is large.  Intervention   No interventions have been documented.  Coronary Diagrams   Diagnostic Diagram       Implants    No implant documentation for this case.  MERGE Images   Show images for CARDIAC CATHETERIZATION   Link to Procedure Log   Procedure Log    Hemo Data    Most Recent Value  Fick Cardiac  Output 3.53 L/min  Fick Cardiac Output Index 1.78 (L/min)/BSA  Aortic Mean Gradient 14.35 mmHg  Aortic Peak Gradient 15 mmHg  Aortic Valve Area 0.89  Aortic Value Area Index 0.45 cm2/BSA  RA A Wave 22 mmHg  RA V Wave 21 mmHg  RA Mean 19 mmHg  RV Systolic Pressure 54 mmHg  RV Diastolic Pressure 7 mmHg  RV EDP 17 mmHg  PA Systolic Pressure 62 mmHg  PA Diastolic Pressure 21 mmHg  PA Mean 42 mmHg  PW A Wave 35 mmHg  PW V Wave 43 mmHg  PW Mean 40 mmHg  AO Systolic Pressure 419 mmHg  AO Diastolic Pressure 68 mmHg  AO Mean 86 mmHg  LV Systolic Pressure 379 mmHg  LV Diastolic Pressure 14 mmHg  LV EDP 29 mmHg  AOp Systolic Pressure 024 mmHg  AOp Diastolic Pressure 69 mmHg  AOp Mean Pressure 85 mmHg  LVp Systolic Pressure 097 mmHg  LVp Diastolic Pressure 9 mmHg  LVp EDP Pressure 24 mmHg  QP/QS 1  TPVR Index 23.66 HRUI  TSVR Index 48.45 HRUI  PVR SVR Ratio 0.03  TPVR/TSVR Ratio 0.49    Cardiac TAVR CT  TECHNIQUE: The patient was scanned on a [employer]. A 120 kV retrospective scan was triggered in the descending thoracic aorta at 111 HU's. Gantry rotation speed was 250 msecs and collimation was .6 mm. No beta blockade or nitro were given. The 3D data set was reconstructed in 5% intervals of the R-R cycle. Systolic and diastolic phases were analyzed on a dedicated work station using MPR, MIP and VRT modes. The patient received 80 cc of contrast.  FINDINGS: Aortic Valve: Trileaflet aortic valve with severely thickened leaflets, moderate calcifications and mild calcifications extending into the LVOT under the left and non-coronary cusps.  Aorta: Normal size with mild diffuse calcifications and no dissection.  Sinotubular Junction: 33 x 29 mm  Ascending Thoracic Aorta:  36 x 33 mm  Aortic Arch: 27 x 25 mm  Descending Thoracic Aorta: 25 x 24 mm  Sinus of Valsalva Measurements:  Non-coronary: 35 mm  Right -coronary: 37 mm  Left -coronary: 37  mm  Coronary Artery Height above Annulus:  Left Main: 14 mm  Right Coronary: 18 mm  Virtual Basal Annulus Measurements:  Maximum/Minimum Diameter: 31.8 x 24.1 mm  Mean Diameter: 27.5 mm  Perimeter: 90.2 mm  Area: 595 mm2  Optimum Fluoroscopic Angle for Delivery: LAO 6 CAU 7  IMPRESSION: 1. Trileaflet aortic valve with severely thickened leaflets, moderate calcifications and mild calcifications extending into the LVOT under the left and non-coronary cusps. Annular measurements suitable for delivery of a 29 mm Edwards-SAPIEN 3 valve.  2. Sufficient coronary to annulus distance.  3. Optimum Fluoroscopic Angle for Delivery:  LAO 6 CAU 7  4. No thrombus in the left atrial appendage.   Electronically Signed   By: Ena Dawley   On: 04/26/2018 20:04   CT ANGIOGRAPHY CHEST, ABDOMEN AND PELVIS  TECHNIQUE: Multidetector CT imaging through the chest, abdomen and pelvis  was performed using the standard protocol during bolus administration of intravenous contrast. Multiplanar reconstructed images and MIPs were obtained and reviewed to evaluate the vascular anatomy.  CONTRAST:  131mL ISOVUE-370 IOPAMIDOL (ISOVUE-370) INJECTION 76%  COMPARISON:  Chest CT 12/31/2012. CT the abdomen and pelvis 07/04/2010.  FINDINGS: CTA CHEST FINDINGS  Cardiovascular: Heart size is mildly enlarged. There is no significant pericardial fluid, thickening or pericardial calcification. There is aortic atherosclerosis, as well as atherosclerosis of the great vessels of the mediastinum and the coronary arteries, including calcified atherosclerotic plaque in the left main, left anterior descending, left circumflex and right coronary arteries. Status post median sternotomy for CABG. Severe thickening calcification of the aortic valve. Calcifications of the anterior leaflet of the mitral valve.  Mediastinum/Lymph Nodes: No pathologically enlarged mediastinal or hilar  lymph nodes. Esophagus is unremarkable in appearance. No axillary lymphadenopathy.  Lungs/Pleura: Small bilateral pleural effusions (left greater than right) lying dependently. 5 mm subpleural nodule in the periphery of the right lower lobe abutting the minor fissure (axial image 48 of series 7), unchanged dating back to 2014, considered definitively benign (presumably a subpleural lymph noted). No other suspicious appearing pulmonary nodules or masses are noted. Some mild dependent subsegmental atelectasis is noted in the lower lobes of the lungs bilaterally. Small focus of nodular appearing probable airspace consolidation in the superior segment of the left lower lobe, concerning for pneumonia (axial image 46 of series 7).  Musculoskeletal/Soft Tissues: Median sternotomy wires. There are no aggressive appearing lytic or blastic lesions noted in the visualized portions of the skeleton.  CTA ABDOMEN AND PELVIS FINDINGS  Hepatobiliary: 9 mm hypervascular lesion in segment 8 of the liver (axial image 90 of series 6). No other definite cystic or solid hepatic lesions. No intra or extrahepatic biliary ductal dilatation. Gallbladder is normal in appearance.  Pancreas: No pancreatic mass. No pancreatic ductal dilatation. No pancreatic or peripancreatic fluid or inflammatory changes.  Spleen: Unremarkable.  Adrenals/Urinary Tract: 8 mm nonobstructive calculus in the lower pole collecting system of the right kidney. A few tiny subcentimeter low-attenuation lesions in the kidneys bilaterally are too small to definitively characterize, but are statistically favored to represent tiny cysts. No suspicious renal lesions. No hydroureteronephrosis. Bilateral adrenal glands are normal in appearance. Urinary bladder is normal in appearance.  Stomach/Bowel: Normal appearance of the stomach. No pathologic dilatation of small bowel or colon. Very large volume of stool throughout the  colon, suggesting constipation. Normal appendix.  Vascular/Lymphatic: Aortic atherosclerosis with vascular findings and measurements pertinent to potential TAVR procedure, as detailed below. No aneurysm or dissection noted in the abdominal or pelvic vasculature. No lymphadenopathy noted in the abdomen or pelvis.  Reproductive: Prostate gland and seminal vesicles are unremarkable in appearance.  Other: No significant volume of ascites.  No pneumoperitoneum.  Musculoskeletal: There are no aggressive appearing lytic or blastic lesions noted in the visualized portions of the skeleton.  VASCULAR MEASUREMENTS PERTINENT TO TAVR:  AORTA:  Minimal Aortic Diameter-16 x 17 mm  Severity of Aortic Calcification-mild-to-moderate  RIGHT PELVIS:  Right Common Iliac Artery -  Minimal Diameter-11.6 x 9.9 mm  Tortuosity-mild  Calcification-moderate  Right External Iliac Artery -  Minimal Diameter-7.8 x 8.1 mm  Tortuosity - mild  Calcification - mild  Right Common Femoral Artery -  Minimal Diameter-8.8 x 8.5 mm  Tortuosity - mild  Calcification - mild  LEFT PELVIS:  Left Common Iliac Artery -  Minimal Diameter-10.6 x 10.2 mm  Tortuosity - mild  Calcification-mild  Left External Iliac  Artery -  Minimal Diameter-7.3 x 8.7 mm  Tortuosity - mild  Calcification - mild  Left Common Femoral Artery -  Minimal Diameter-8.1 x 8.8 mm  Tortuosity - mild  Calcification - mild  Review of the MIP images confirms the above findings.  IMPRESSION: 1. Vascular findings and measurements pertinent to potential TAVR procedure, as detailed above. 2. Severe thickening calcification of the aortic valve, compatible with the reported clinical history of severe aortic stenosis. Nodular focus of what appears to be airspace consolidation in the superior segment of the left lower lobe, concerning for a small focus of pneumonia. The possibility of a  true pulmonary nodule in this region is not entirely excluded, but is not favored. Further clinical evaluation is recommended, as well as follow-up noncontrast chest CT in the 2-3 months to ensure resolution of this finding. 3. Aortic atherosclerosis, in addition to left main and 3 vessel coronary artery disease. Status post median sternotomy for CABG. 4. Small bilateral pleural effusions (left greater than right) lying dependently. 5. Mild cardiomegaly. 6. Small hypervascular lesion measuring 9 mm in segment 8 of the liver. This is favored to be a benign perfusion anomaly or potentially a flash fill cavernous hemangioma. This could be definitively characterized with nonemergent MRI of the abdomen with and without IV gadolinium if of clinical concern. 7. There nonobstructive calculus in the lower pole collecting system of the right kidney. 8. Large volume of stool throughout the colon suggesting constipation.  Aortic Atherosclerosis (ICD10-I70.0).   Electronically Signed   By: Vinnie Langton M.D.   On: 04/26/2018 13:59    Impression:  Patient has stage the severe symptomatic low gradient low ejection fraction aortic stenosis.  He presents with progressive symptoms of exertional shortness of breath, orthopnea, lower extremity edema, PND, and a dry nonproductive cough with recent hospitalization for acute exacerbation of chronic combined systolic and diastolic congestive heart failure.  He responded to medical therapy and has remained stable since hospital discharge with class II symptoms of exertional shortness of breath.  I have personally reviewed the patient's recent transthoracic echocardiogram, diagnostic cardiac catheterization, and CT angiograms.  Echocardiogram demonstrates the presence of severe global left ventricular systolic dysfunction with ejection fraction estimated 30%.  The aortic valve is trileaflet with severe thickening, calcification, and restricted leaflet  mobility involving all 3 leaflets.  Peak velocity across the aortic valve was measured as high as 2.9 to 3.0 m/s corresponding to mean transvalvular gradient estimated 20 mmHg.  The DVI was 0.22 with aortic valve area calculated 1.02 m/s.  The patient also has moderate aortic insufficiency.  Diagnostic cardiac catheterization revealed severe two-vessel coronary artery disease with chronic total occlusion of the right coronary artery and the left circumflex coronary artery.  There was continued patency of the saphenous vein graft to both the obtuse marginal branch of left circumflex and the posterior descending coronary artery off of the distal right coronary artery.  There was moderate nonobstructive disease in the left anterior descending coronary artery.  There was mild to moderate pulmonary hypertension with relatively low cardiac output.  By catheterization the aortic valve area was calculated 0.89 cm.   I agree the patient would benefit from aortic valve replacement.  I would not consider this patient a candidate for conventional surgery because of his 2 previous median sternotomies and history of radiation therapy to the mediastinum.  Cardiac-gated CTA of the heart reveals anatomical characteristics consistent with aortic stenosis suitable for treatment by transcatheter aortic valve replacement without any  significant complicating features, although there is some calcification in the aortic annulus which might slightly increase the risk of paravalvular leak.  CTA of the aorta and iliac vessels demonstrate what appears to be adequate pelvic vascular access to facilitate a transfemoral approach.    Plan:  The patient and his wife were counseled at length regarding treatment alternatives for management of severe symptomatic aortic stenosis. Alternative approaches such as conventional aortic valve replacement, transcatheter aortic valve replacement, and continued medical therapy without intervention were  compared and contrasted at length.  The risks associated with conventional surgical aortic valve replacement were discussed in detail, as were expectations for post-operative convalescence, and why I would be reluctant to consider this patient a candidate for conventional surgery.  Issues specific to transcatheter aortic valve replacement were discussed including questions about long term valve durability, the potential for paravalvular leak, possible increased risk of need for permanent pacemaker placement, and other technical complications related to the procedure itself.  Long-term prognosis with medical therapy was discussed. This discussion was placed in the context of the patient's own specific clinical presentation and past medical history.  All of their questions have been addressed.  The patient hopes to proceed with transcatheter aortic valve replacement as soon as practical.  We tentatively plan to proceed with surgery on May 15, 2018.  Following the decision to proceed with transcatheter aortic valve replacement, a discussion has been held regarding what types of management strategies would be attempted intraoperatively in the event of life-threatening complications, including whether or not the patient would be considered a candidate for the use of cardiopulmonary bypass and/or conversion to open sternotomy for attempted surgical intervention.  The patient has been advised of a variety of complications that might develop including but not limited to risks of death, stroke, paravalvular leak, aortic dissection or other major vascular complications, aortic annulus rupture, device embolization, cardiac rupture or perforation, mitral regurgitation, acute myocardial infarction, arrhythmia, heart block or bradycardia requiring permanent pacemaker placement, congestive heart failure, respiratory failure, renal failure, pneumonia, infection, other late complications related to structural valve  deterioration or migration, or other complications that might ultimately cause a temporary or permanent loss of functional independence or other long term morbidity.  The patient provides full informed consent for the procedure as described and all questions were answered.   I spent in excess of 90 minutes during the conduct of this office consultation and >50% of this time involved direct face-to-face encounter with the patient for counseling and/or coordination of their care.    Valentina Gu. Roxy Manns, MD 05/01/2018 12:23 PM

## 2018-05-03 ENCOUNTER — Encounter (HOSPITAL_COMMUNITY): Payer: Self-pay

## 2018-05-04 ENCOUNTER — Encounter (HOSPITAL_COMMUNITY): Payer: Self-pay

## 2018-05-08 ENCOUNTER — Encounter (HOSPITAL_COMMUNITY): Payer: Self-pay

## 2018-05-10 ENCOUNTER — Encounter (HOSPITAL_COMMUNITY): Payer: Self-pay

## 2018-05-10 NOTE — Pre-Procedure Instructions (Signed)
[name]  05/10/2018      [facility] DR[facility]ORE [place], [place] AT [facility] OF [address] [place] [address] Phone: [phone] Fax: [phone]    Your procedure is scheduled on 05-15-2018 Tuesday .  Report to [facility] Admitting at 10:00 A.M .  Call this number if you have problems the morning of surgery:  [phone]   Remember:  Do not eat  Food or drink liquids after midnight.   Continue taking all current medications without change through the day before surgery.  .                          Take these medicines the morning of surgery with A SIP OF WATER         Levothyroxine(Synthroid)  STOP TAKING ANY ASPIRIN (UNLESS OTHERWISE INSTRUCTED BY YOUR SURGEON),ANTIINFLAMATORIES (IBUPROFEN,ALEVE,MOTRIN,ADVIL,GOODY'S POWDERS),HERBAL SUPPLEMENTS,FISH OIL,AND VITAMINS 5-7 DAYS PRIOR TO SURGERY     How to Manage Your Diabetes Before and After Surgery  Why is it important to control my blood sugar before and after surgery? . Improving blood sugar levels before and after surgery helps healing and can limit problems. . A way of improving blood sugar control is eating a healthy diet by: o  Eating less sugar and carbohydrates o  Increasing activity/exercise o  Talking with your doctor about reaching your blood sugar goals . High blood sugars (greater than 180 mg/dL) can raise your risk of infections and slow your recovery, so you will need to focus on controlling your diabetes during the weeks before surgery. . Make sure that the doctor who takes care of your diabetes knows about your planned surgery including the date and location.  How do I manage my blood sugar before surgery? . Check your blood sugar at least 4 times a day, starting 2 days before surgery, to make sure that the level is not too high or low. o Check your blood sugar the morning of your surgery when you wake up and every 2 hours  until you get to the Short Stay unit. . If your blood sugar is less than 70 mg/dL, you will need to treat for low blood sugar: o Do not take insulin. o Treat a low blood sugar (less than 70 mg/dL) with  cup of clear juice (cranberry or apple), 4 glucose tablets, OR glucose gel. Recheck blood sugar in 15 minutes after treatment (to make sure it is greater than 70 mg/dL). If your blood sugar is not greater than 70 mg/dL on recheck, call [phone] o  for further instructions. . Report your blood sugar to the short stay nurse when you get to Short Stay.  . If you are admitted to the hospital after surgery: o Your blood sugar will be checked by the staff and you will probably be given insulin after surgery (instead of oral diabetes medicines) to make sure you have good blood sugar levels. o The goal for blood sugar control after surgery is 80-180 mg/dL.              WHAT DO I DO ABOUT MY DIABETES MEDICATION?   Marland Kitchen Do not take oral diabetes medicines (pills) the morning of surgery. . Metformin(Glucophage)    .  . if your CBG is greater than 220 mg/dL, you may take  of your sliding scale (correction) dose of insulin.  Other Instructions:  Contact your Primary care Physician or endocrinologist for instructions on for your insulin pump or reduce all basal rates by 20% at [place]        Patient Signature:  Date:   Nurse Signature:  Date:   Reviewed and Endorsed by [facility] Patient Education Committee, August 2015    Do not wear jewelry,  Do not wear lotions, powders, or perfumes, or deodorant.  Do not shave 48 hours prior to surgery.  Men may shave face and neck.  Do not bring valuables to the hospital.  [facility] is not responsible for any belongings or valuables.  Contacts, dentures or bridgework may not be worn into surgery.  Leave your suitcase in the car.  After surgery it may be brought to your room.  For patients admitted to the hospital, discharge time  will be determined by your treatment team.  Patients discharged the day of surgery will not be allowed to drive home.     [place] - Preparing for Surgery  Before surgery, you can play an important role.  Because skin is not sterile, your skin needs to be as free of germs as possible.  You can reduce the number of germs on you skin by washing with CHG (chlorahexidine gluconate) soap before surgery.  CHG is an antiseptic cleaner which kills germs and bonds with the skin to continue killing germs even after washing.  Oral Hygiene is also important in reducing the risk of infection.  Remember to brush your teeth with your regular toothpaste the morning of surgery.  Please DO NOT use if you have an allergy to CHG or antibacterial soaps.  If your skin becomes reddened/irritated stop using the CHG and inform your nurse when you arrive at Short Stay.  Do not shave (including legs and underarms) for at least 48 hours prior to the first CHG shower.  You may shave your face.  Please follow these instructions carefully:   1.  Shower with CHG Soap the night before surgery and the morning of Surgery.  2.  If you choose to wash your hair, wash your hair first as usual with your normal shampoo.  3.  After you shampoo, rinse your hair and body thoroughly to remove the shampoo. 4.  Use CHG as you would any other liquid soap.  You can apply chg directly to the skin and wash gently with a      scrungie or washcloth.           5.  Apply the CHG Soap to your body ONLY FROM THE NECK DOWN.   Do not use on open wounds or open sores. Avoid contact with your eyes, ears, mouth and genitals (private parts).  Wash genitals (private parts) with your normal soap.  6.  Wash thoroughly, paying special attention to the area where your surgery will be performed.  7.  Thoroughly rinse your body with warm water from the neck down.  8.  DO NOT shower/wash with your normal soap after using and rinsing off the CHG Soap.  9.   Pat yourself dry with a clean towel.            10.  Wear clean pajamas.            11.  Place clean sheets on your bed the night of your first shower and do not sleep with pets.  Day of Surgery  Do not apply any lotions/deoderants the morning of surgery.   Please wear clean  clothes to the hospital/surgery center. Remember to brush your teeth with toothpaste.    Please read over the following fact sheets that you were given. Pain Booklet, Coughing and Deep Breathing and Surgical Site Infection Prevention

## 2018-05-11 ENCOUNTER — Ambulatory Visit (HOSPITAL_COMMUNITY)
Admission: RE | Admit: 2018-05-11 | Discharge: 2018-05-11 | Disposition: A | Discharge status: Home / Self Care | Payer: Medicare Other | Source: Ambulatory Visit | Attending: Cardiovascular Disease | Admitting: Cardiovascular Disease

## 2018-05-11 ENCOUNTER — Encounter (HOSPITAL_COMMUNITY)
Admission: RE | Admit: 2018-05-11 | Discharge: 2018-05-11 | Disposition: A | Discharge status: Home / Self Care | Payer: Medicare Other | Source: Ambulatory Visit | Attending: Cardiovascular Disease | Admitting: Cardiovascular Disease

## 2018-05-11 ENCOUNTER — Encounter (HOSPITAL_COMMUNITY): Payer: Self-pay

## 2018-05-11 ENCOUNTER — Other Ambulatory Visit: Payer: Self-pay

## 2018-05-11 DIAGNOSIS — Z01818 Encounter for other preprocedural examination: Secondary | ICD-10-CM | POA: Insufficient documentation

## 2018-05-11 DIAGNOSIS — I350 Nonrheumatic aortic (valve) stenosis: Secondary | ICD-10-CM | POA: Insufficient documentation

## 2018-05-11 HISTORY — DX: Unspecified osteoarthritis, unspecified site: M19.90

## 2018-05-11 HISTORY — DX: Anemia, unspecified: D64.9

## 2018-05-11 HISTORY — DX: Personal history of urinary calculi: Z87.442

## 2018-05-11 LAB — COMPREHENSIVE METABOLIC PANEL
ALK PHOS: 73 U/L (ref 38–126)
ALT: 55 U/L — ABNORMAL HIGH (ref 0–44)
AST: 33 U/L (ref 15–41)
Albumin: 3.5 g/dL (ref 3.5–5.0)
Anion gap: 8 (ref 5–15)
BILIRUBIN TOTAL: 0.7 mg/dL (ref 0.3–1.2)
BUN: 20 mg/dL (ref 8–23)
CALCIUM: 9.3 mg/dL (ref 8.9–10.3)
CO2: 18 mmol/L — ABNORMAL LOW (ref 22–32)
CREATININE: 1.22 mg/dL (ref 0.61–1.24)
Chloride: 112 mmol/L — ABNORMAL HIGH (ref 98–111)
GFR, EST NON AFRICAN AMERICAN: 59 mL/min — AB (ref >60)
Glucose, Bld: 175 mg/dL — ABNORMAL HIGH (ref 70–99)
Potassium: 4.1 mmol/L (ref 3.5–5.1)
Sodium: 138 mmol/L (ref 135–145)
TOTAL PROTEIN: 6.4 g/dL — AB (ref 6.5–8.1)

## 2018-05-11 LAB — URINALYSIS, ROUTINE W REFLEX MICROSCOPIC
BILIRUBIN URINE: NEGATIVE
Ketones, ur: NEGATIVE mg/dL
Leukocytes, UA: NEGATIVE
NITRITE: NEGATIVE
Protein, ur: NEGATIVE mg/dL
Specific Gravity, Urine: 1.011 (ref 1.005–1.030)
pH: 5 (ref 5.0–8.0)

## 2018-05-11 LAB — TYPE AND SCREEN
ABO/RH(D): O POS
Antibody Screen: NEGATIVE

## 2018-05-11 LAB — CBC
HEMATOCRIT: 46.6 % (ref 39.0–52.0)
Hemoglobin: 14.8 g/dL (ref 13.0–17.0)
MCH: 26.8 pg (ref 26.0–34.0)
MCHC: 31.8 g/dL (ref 30.0–36.0)
MCV: 84.4 fL (ref 78.0–100.0)
PLATELETS: 175 10*3/uL (ref 150–400)
RBC: 5.52 MIL/uL (ref 4.22–5.81)
RDW: 14.4 % (ref 11.5–15.5)
WBC: 3.6 10*3/uL — ABNORMAL LOW (ref 4.0–10.5)

## 2018-05-11 LAB — HEMOGLOBIN A1C
HEMOGLOBIN A1C: 7.6 % — AB (ref 4.8–5.6)
MEAN PLASMA GLUCOSE: 171.42 mg/dL

## 2018-05-11 LAB — PROTIME-INR
INR: 1.05
Prothrombin Time: 13.6 seconds (ref 11.4–15.2)

## 2018-05-11 LAB — APTT: APTT: 32 s (ref 24–36)

## 2018-05-11 LAB — BRAIN NATRIURETIC PEPTIDE: B Natriuretic Peptide: 740.6 pg/mL — ABNORMAL HIGH (ref 0.0–100.0)

## 2018-05-11 LAB — SURGICAL PCR SCREEN
MRSA, PCR: NEGATIVE
STAPHYLOCOCCUS AUREUS: NEGATIVE

## 2018-05-11 LAB — GLUCOSE, CAPILLARY: Glucose-Capillary: 147 mg/dL — ABNORMAL HIGH (ref 70–99)

## 2018-05-11 NOTE — Progress Notes (Signed)
Anesthesia Chart Review:  Case:  [id number] Date/Time:  05/15/18 1213   Procedures:      TRANSCATHETER AORTIC VALVE REPLACEMENT, TRANSFEMORAL (N/A Chest)     TRANSESOPHAGEAL ECHOCARDIOGRAM (TEE) (N/A )   Anesthesia type:  General   Pre-op diagnosis:  Severe Aortic Stenosis   Location:  [facility] OR ROOM 16 / [facility] OR   Surgeon:  Burnell Blanks, MD      DISCUSSION: Patient is a 67 year old male scheduled for the above procedure.  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   Per PAT RN notes, patient has what sounds like a fairly new insulin pump. He missed his appointment to review programming when he was hospitalized for CHF. He stated that he was being seen at Dr. Almetta Lovely office on 05/11/18 and would inquire about programming his pump, particularly given recommended adjustments in basal rate while NPO. [place] DM RN Coordinator notified of planned admission. I also sent a staff message to Nell Range, Hershal Coria and Theodosia Quay, RN about this given he is currently scheduled as a third case. PAT RN advised to let patient know he can arrive before scheduled arrival time if he needs closer monitoring on his glucose while NPO and awaiting surgery.    VS: BP (!) 101/57   Pulse 95   Temp 36.6 C   Resp 18   Ht 5\' 11"  (1.803 m)   Wt 77.2 kg   SpO2 100%   BMI 23.75 kg/m   PROVIDERS: Koirala, Dibas, MD is PCP Fransico Him, MD is cardiologist Jacelyn Pi, MD is endocrinolgoist   LABS: Labs reviewed: Acceptable for surgery. (all labs ordered are listed, but only abnormal results are displayed)  Labs Reviewed  GLUCOSE, CAPILLARY - Abnormal; Notable for the following components:      Result Value   Glucose-Capillary 147  (*)    All other components within normal limits  BLOOD GAS, ARTERIAL - Abnormal; Notable for the following components:   Acid-base deficit 3.9 (*)    All other components within normal limits  BRAIN NATRIURETIC PEPTIDE - Abnormal; Notable for the following components:   B Natriuretic Peptide 740.6 (*)    All other components within normal limits  CBC - Abnormal; Notable for the following components:   WBC 3.6 (*)    All other components within normal limits  COMPREHENSIVE METABOLIC PANEL - Abnormal; Notable for the following components:   Chloride 112 (*)    CO2 18 (*)    Glucose, Bld 175 (*)    Total Protein 6.4 (*)    ALT 55 (*)    GFR calc non Af Amer 59 (*)    All other components within normal limits  HEMOGLOBIN A1C - Abnormal; Notable for the following components:   Hgb A1c MFr Bld 7.6 (*)    All other components within normal limits  URINALYSIS, ROUTINE W REFLEX MICROSCOPIC - Abnormal; Notable for the following components:   APPearance HAZY (*)    Glucose, UA >=500 (*)    Hgb urine dipstick LARGE (*)    RBC / HPF >50 (*)    Bacteria, UA RARE (*)    All other components within normal limits  SURGICAL PCR SCREEN  APTT  PROTIME-INR  TYPE AND SCREEN    PFTs 04/26/18:  FVC 2.62 (65%), FEV1 2.45 (80%), DLCO unc 21.11 (62%).   IMAGES: CXR 05/11/18: IMPRESSION: No acute abnormality.  EKG: 04/23/18: SR with first degree AV block. Minimal voltage criteria for LVH, may be normal variant.    CV: CT coronary 04/26/18: IMPRESSION: 1. Trileaflet aortic valve with severely thickened leaflets, moderate calcifications and mild calcifications extending into the LVOT under the left and non-coronary cusps. Annular measurements suitable for delivery of a 29 mm Edwards-SAPIEN 3 valve. 2. Sufficient coronary to annulus distance. 3. Optimum Fluoroscopic Angle for Delivery:  LAO 6 CAU 7 4. No thrombus in the left atrial appendage.  RHC/LHC 04/13/18:  Ost 1st Diag to 1st Diag lesion  is 50% stenosed.  Prox LAD to Mid LAD lesion is 40% stenosed.  Ost RCA lesion is 100% stenosed.  SVG and is normal in caliber and large.  SVG and is large.  Ost Cx to Prox Cx lesion is 100% stenosed.  Ost LAD to Prox LAD lesion is 40% stenosed. Findings: Ao =109/68 (86) LV = 117/29 RA = 19 RV = 54/17 PA = 62/21 (42) PCW = 40 Fick cardiac output/index = 3.5/1.78 PVR = 0.6 WU FA sat = 96% PA sat = 58%, 61% PAPi = 2.2 RA/PCWP = 0.475 CPO = 0.67 Av gradient: peak 15.0 mmHG  Mean 14.91mmHG  AVA = 0.89 cm2 Assessment: 1. Severe 2v CAD with total occlusion of ostial RCA and LCx 2. Moderate non-obstructive CAD in LAD 3. EF 30% by echo (no v-gram done due to high LVEDP) 4. Severe low gradient AS with reduced cardiac output and markedly elevated filling pressures Plan/Discussion: 1) Continue diuresis - I increased lasix to 80 IVT tid. Cut lopressor to 50 daily with low output 2) Continue with TAVR w/up. D/w Drs. Ellyn Hack and Hammett  Echo 04/02/18: Study Conclusions - Left ventricle: The cavity size was normal. There was mild focal   basal hypertrophy of the septum. Systolic function was moderately   to severely reduced. The estimated ejection fraction was in the   range of 30% to 35%. There is akinesis of the anteroseptal   myocardium. There was no evidence of elevated ventricular filling   pressure by Doppler parameters. - Aortic valve: Moderately calcified annulus. Trileaflet; severely   thickened, severely calcified leaflets. Valve mobility was   restricted. There was moderate stenosis. There was mild   regurgitation. Peak velocity (S): 293 cm/s. Mean gradient (S): 20   mm Hg. - Aorta: Mild advancement in aortic stenosis (prior mean gradient   62mmHg). Aortic root dimension: 38 mm (ED). - Ascending aorta: The ascending aorta was mildly dilated. - Mitral valve: There was mild regurgitation. - Left atrium: The atrium was moderately dilated. - Tricuspid valve: There was  mild regurgitation. - Pulmonary arteries: Systolic pressure was mildly increased. PA   peak pressure: 38 mm Hg (S).  Carotid U/S 04/26/18: Final Interpretation: Right Carotid: Velocities in the right ICA are consistent with a 1-39% stenosis. Left Carotid: Velocities in the left ICA are consistent with a 1-39% stenosis.  48 Hour Holter monitor 04/02/18-04/04/18:  Normal sinus rhythm and sinus tachycardia. The average heart rate 109 bpm. Heart rate ranged from 93 to 127 bpm.  Occasional PVCs, bigeminal PVCs and ventricular couplets. PVC load 1%.  Rare PAC   Past Medical History:  Diagnosis Date  . Acute on chronic combined systolic and diastolic CHF (congestive heart failure) ([place]) 04/16/2018  . Anemia   . Aortic stenosis   . Arthritis   . Cancer ([place])  mediastinal seminoma resected 1985 - benign  . Chronic kidney disease   . Coronary artery disease 02/2012   a. s/p stenting in 2013  b. s/p CABGx2V (SVG--> PDA, SVG--> LCx).  . Diabetes mellitus    neuropathy  insulin dependent  . Dyslipidemia   . Erectile dysfunction   . GERD (gastroesophageal reflux disease)   . History of kidney stones   . Hx of radiation therapy to mediastinum 1985  . Hypertension   . Malignant seminoma of mediastinum ([place]) 1985  . Stroke [facility])    TIA's    Past Surgical History:  Procedure Laterality Date  . COLONOSCOPY  07/25/2012   Procedure: COLONOSCOPY;  Surgeon: Winfield Cunas., MD;  Location: [facility] ENDOSCOPY;  Service: Endoscopy;  Laterality: N/A;  . COLONOSCOPY N/A 12/02/2013   Procedure: COLONOSCOPY;  Surgeon: Winfield Cunas., MD;  Location: [facility] ENDOSCOPY;  Service: Endoscopy;  Laterality: N/A;  . CORONARY ARTERY BYPASS GRAFT N/A 01/04/2013   Procedure: CORONARY ARTERY BYPASS GRAFTING (CABG) times two using right saphenous vein harvested with endoscope.;  Surgeon: Ivin Poot, MD;  Location: [facility];  Service: Open Heart Surgery;  Laterality: N/A;  . ESOPHAGOGASTRODUODENOSCOPY  07/20/2012    Procedure: ESOPHAGOGASTRODUODENOSCOPY (EGD);  Surgeon: Winfield Cunas., MD;  Location: [facility] ENDOSCOPY;  Service: Endoscopy;  Laterality: N/A;  . ESOPHAGOGASTRODUODENOSCOPY N/A 12/02/2013   Procedure: ESOPHAGOGASTRODUODENOSCOPY (EGD);  Surgeon: Winfield Cunas., MD;  Location: [facility] ENDOS[facility];  Service: Endoscopy;  Laterality: N/A;  . HOT HEMOSTASIS N/A 12/02/2013   Procedure: HOT HEMOSTASIS (ARGON PLASMA COAGULATION/BICAP);  Surgeon: Winfield Cunas., MD;  Location: [facility] ENDOSCOPY;  Service: Endoscopy;  Laterality: N/A;  . INTRAOPERATIVE TRANSESOPHAGEAL ECHOCARDIOGRAM N/A 01/04/2013   Procedure: INTRAOPERATIVE TRANSESOPHAGEAL ECHOCARDIOGRAM;  Surgeon: Ivin Poot, MD;  Location: [facility];  Service: Open Heart Surgery;  Laterality: N/A;  . LEFT HEART CATHETERIZATION WITH CORONARY ANGIOGRAM N/A 03/06/2012   Procedure: LEFT HEART CATHETERIZATION WITH CORONARY ANGIOGRAM;  Surgeon: [name], MD;  Location: [place] CATH LAB;  Service: Cardiovascular;  Laterality: N/A;  . lithotripsy    . MEDIAN STERNOTOMY  1985  . RADIAL ARTERY HARVEST Left 01/04/2013   Procedure: RADIAL ARTERY HARVEST;  Surgeon: Ivin Poot, MD;  Location: [facility];  Service: Vascular;  Laterality: Left;  Artery not havested. Unsuitable for use.  . resection mediastinal seminonma    . RIGHT/LEFT HEART CATH AND CORONARY/GRAFT ANGIOGRAPHY N/A 11/15/2016   Procedure: Right/Left Heart Cath and Coronary/Graft Angiography;  Surgeon: Leonie Man, MD;  Location: [place] CV LAB;  Service: Cardiovascular;  Laterality: N/A;  . RIGHT/LEFT HEART CATH AND CORONARY/GRAFT ANGIOGRAPHY N/A 04/13/2018   Procedure: RIGHT/LEFT HEART CATH AND CORONARY/GRAFT ANGIOGRAPHY;  Surgeon: Jolaine Artist, MD;  Location: [place] CV LAB;  Service: Cardiovascular;  Laterality: N/A;    MEDICATIONS: . amitriptyline (ELAVIL) 100 MG tablet  . B Complex-C (SUPER B COMPLEX PO)  . B-D ULTRAFINE III SHORT PEN 31G X 8 MM MISC  . Cholecalciferol (VITAMIN D-3)  5000 units TABS  . HUMALOG KWIKPEN 100 UNIT/ML KiwkPen  . hydroxypropyl methylcellulose / hypromellose (ISOPTO TEARS / GONIOVISC) 2.5 % ophthalmic solution  . levothyroxine (SYNTHROID, LEVOTHROID) 50 MCG tablet  . metFORMIN (GLUCOPHAGE) 500 MG tablet  . metoprolol succinate (TOPROL-XL) 50 MG 24 hr tablet  . Multiple Vitamin (MULTIVITAMIN) tablet  . ONE TOUCH ULTRA TEST test strip  . simvastatin (ZOCOR) 40 MG tablet  . zolpidem (AMBIEN) 10 MG tablet   No current facility-administered  medications for this encounter.     [facility] Short Stay Center/Anesthesiology Phone [phone] 05/11/2018 5:35 PM

## 2018-05-11 NOTE — Progress Notes (Addendum)
PCP  Dibas Koirala  MD  Endocrinologist Dr. Bubba Camp  Pt. Has insulin pump which the rate is  0.8 units per hour. Pt. States he has never been shown how to program the pump. He was suppose to go back to office the next day for someone to show him what to do but he was admitted to the hospital with CHF.  Mr. [name] says he is has to go to Dr. Gardiner Barefoot office today and will make sure someone shows him how to adjust his basal rate the night before surgery  Diabetes coordinator notified of date and time of patient surgery.  Chart to anesthesia for review.  IB message sent to Dr. Angelena Form regarding abnormal urine.

## 2018-05-14 ENCOUNTER — Telehealth: Payer: Self-pay

## 2018-05-14 MED ORDER — DEXMEDETOMIDINE HCL IN NACL 400 MCG/100ML IV SOLN
0.1000 ug/kg/h | INTRAVENOUS | Status: AC
  Administered 2018-05-15: 1 ug/kg/h via INTRAVENOUS
  Filled 2018-05-14: qty 100

## 2018-05-14 MED ORDER — VANCOMYCIN HCL 10 G IV SOLR
1250.0000 mg | INTRAVENOUS | Status: AC
  Administered 2018-05-15 (×2): 1250 mg via INTRAVENOUS
  Filled 2018-05-14: qty 1250

## 2018-05-14 MED ORDER — NITROGLYCERIN IN D5W 200-5 MCG/ML-% IV SOLN
2.0000 ug/min | INTRAVENOUS | Status: DC
  Filled 2018-05-14: qty 250

## 2018-05-14 MED ORDER — EPINEPHRINE PF 1 MG/ML IJ SOLN
0.0000 ug/min | INTRAVENOUS | Status: DC
  Filled 2018-05-14: qty 4

## 2018-05-14 MED ORDER — DOPAMINE-DEXTROSE 3.2-5 MG/ML-% IV SOLN
0.0000 ug/kg/min | INTRAVENOUS | Status: DC
  Filled 2018-05-14: qty 250

## 2018-05-14 MED ORDER — SODIUM CHLORIDE 0.9 % IV SOLN
INTRAVENOUS | Status: DC
  Filled 2018-05-14: qty 30

## 2018-05-14 MED ORDER — SODIUM CHLORIDE 0.9 % IV SOLN
INTRAVENOUS | Status: DC
  Filled 2018-05-14: qty 1

## 2018-05-14 MED ORDER — SODIUM CHLORIDE 0.9 % IV SOLN
1.5000 g | INTRAVENOUS | Status: AC
  Administered 2018-05-15: 1.5 g via INTRAVENOUS
  Filled 2018-05-14: qty 1.5

## 2018-05-14 MED ORDER — NOREPINEPHRINE 4 MG/250ML-% IV SOLN
0.0000 ug/min | INTRAVENOUS | Status: AC
  Administered 2018-05-15: 2 ug/min via INTRAVENOUS
  Filled 2018-05-14: qty 250

## 2018-05-14 MED ORDER — POTASSIUM CHLORIDE 2 MEQ/ML IV SOLN
80.0000 meq | INTRAVENOUS | Status: DC
  Filled 2018-05-14: qty 40

## 2018-05-14 MED ORDER — PHENYLEPHRINE HCL-NACL 20-0.9 MG/250ML-% IV SOLN
30.0000 ug/min | INTRAVENOUS | Status: DC
  Filled 2018-05-14: qty 250

## 2018-05-14 MED ORDER — MAGNESIUM SULFATE 50 % IJ SOLN
40.0000 meq | INTRAMUSCULAR | Status: DC
  Filled 2018-05-14: qty 9.85

## 2018-05-14 NOTE — Telephone Encounter (Signed)
Burnell Blanks, MD  Barkley Boards, RN        [name], He has blood in his U/A. Do you mind checking with him today to see if he has gross hematuria?   Previous Messages    ----- Message -----  From: Vertell Limber, RN  Sent: 05/11/2018  4:28 PM EDT  To: Burnell Blanks, MD  Subject: abnormal urine                  Pt. Had abnormal u/a Done at [place] visit 05-11-2018       I contacted the pt and his wife and the pt does not have gross hematuria at this time.  He denies seeing blood in the toilet bowl with urination.  The pt also denies pain and burning with urination.  Per the pt's wife the pt did have gross hematuria the day after having myoview performed (prior to CHF hospitalization). The pt will continue to monitor for hematuria.   The pt also has an insulin pump and has been given instructions on how to manage per PCP prior to TAVR.  The pt's wife would like to have a TSH repeated during hospitalization.  The pt has been on thyroid medication for 1 month and she would like to know if his TSH is improving. I will make Dr Angelena Form aware of this request.

## 2018-05-14 NOTE — Telephone Encounter (Signed)
Thanks

## 2018-05-15 ENCOUNTER — Inpatient Hospital Stay (HOSPITAL_COMMUNITY): Payer: Medicare Other | Admitting: Vascular Surgery

## 2018-05-15 ENCOUNTER — Encounter (HOSPITAL_COMMUNITY): Payer: Self-pay

## 2018-05-15 ENCOUNTER — Other Ambulatory Visit: Payer: Self-pay

## 2018-05-15 ENCOUNTER — Inpatient Hospital Stay (HOSPITAL_COMMUNITY): Payer: Medicare Other

## 2018-05-15 ENCOUNTER — Inpatient Hospital Stay (HOSPITAL_COMMUNITY)
Admission: RE | Admit: 2018-05-15 | Discharge: 2018-05-16 | DRG: 267 | Disposition: A | Discharge status: Home / Self Care | Payer: Medicare Other | Attending: Cardiovascular Disease | Admitting: Cardiovascular Disease

## 2018-05-15 ENCOUNTER — Other Ambulatory Visit: Payer: Self-pay | Admitting: Physician Assistant

## 2018-05-15 ENCOUNTER — Inpatient Hospital Stay (HOSPITAL_COMMUNITY): Payer: Medicare Other | Admitting: Certified Registered Nurse Anesthetist

## 2018-05-15 ENCOUNTER — Ambulatory Visit (HOSPITAL_COMMUNITY)
Admission: RE | Admit: 2018-05-15 | Discharge: 2018-05-15 | Disposition: A | Discharge status: Home / Self Care | Payer: Medicare Other | Source: Ambulatory Visit | Attending: Cardiovascular Disease | Admitting: Cardiovascular Disease

## 2018-05-15 ENCOUNTER — Encounter (HOSPITAL_COMMUNITY)
Admission: RE | Disposition: A | Discharge status: Home / Self Care | Payer: Self-pay | Source: Home / Self Care | Attending: Cardiovascular Disease

## 2018-05-15 DIAGNOSIS — Z833 Family history of diabetes mellitus: Secondary | ICD-10-CM | POA: Diagnosis not present

## 2018-05-15 DIAGNOSIS — Z8673 Personal history of transient ischemic attack (TIA), and cerebral infarction without residual deficits: Secondary | ICD-10-CM | POA: Diagnosis not present

## 2018-05-15 DIAGNOSIS — Z955 Presence of coronary angioplasty implant and graft: Secondary | ICD-10-CM | POA: Diagnosis not present

## 2018-05-15 DIAGNOSIS — I352 Nonrheumatic aortic (valve) stenosis with insufficiency: Principal | ICD-10-CM | POA: Diagnosis present

## 2018-05-15 DIAGNOSIS — Z8249 Family history of ischemic heart disease and other diseases of the circulatory system: Secondary | ICD-10-CM | POA: Diagnosis not present

## 2018-05-15 DIAGNOSIS — I130 Hypertensive heart and chronic kidney disease with heart failure and stage 1 through stage 4 chronic kidney disease, or unspecified chronic kidney disease: Secondary | ICD-10-CM | POA: Diagnosis present

## 2018-05-15 DIAGNOSIS — E119 Type 2 diabetes mellitus without complications: Secondary | ICD-10-CM

## 2018-05-15 DIAGNOSIS — Z923 Personal history of irradiation: Secondary | ICD-10-CM | POA: Diagnosis not present

## 2018-05-15 DIAGNOSIS — Z006 Encounter for examination for normal comparison and control in clinical research program: Secondary | ICD-10-CM | POA: Diagnosis not present

## 2018-05-15 DIAGNOSIS — I350 Nonrheumatic aortic (valve) stenosis: Secondary | ICD-10-CM

## 2018-05-15 DIAGNOSIS — I110 Hypertensive heart disease with heart failure: Secondary | ICD-10-CM | POA: Diagnosis not present

## 2018-05-15 DIAGNOSIS — E785 Hyperlipidemia, unspecified: Secondary | ICD-10-CM | POA: Diagnosis present

## 2018-05-15 DIAGNOSIS — N183 Chronic kidney disease, stage 3 (moderate): Secondary | ICD-10-CM | POA: Diagnosis present

## 2018-05-15 DIAGNOSIS — Z79890 Hormone replacement therapy (postmenopausal): Secondary | ICD-10-CM

## 2018-05-15 DIAGNOSIS — I5043 Acute on chronic combined systolic (congestive) and diastolic (congestive) heart failure: Secondary | ICD-10-CM | POA: Diagnosis present

## 2018-05-15 DIAGNOSIS — I2510 Atherosclerotic heart disease of native coronary artery without angina pectoris: Secondary | ICD-10-CM | POA: Diagnosis not present

## 2018-05-15 DIAGNOSIS — Z952 Presence of prosthetic heart valve: Secondary | ICD-10-CM

## 2018-05-15 DIAGNOSIS — Z951 Presence of aortocoronary bypass graft: Secondary | ICD-10-CM | POA: Diagnosis not present

## 2018-05-15 DIAGNOSIS — C383 Malignant neoplasm of mediastinum, part unspecified: Secondary | ICD-10-CM | POA: Diagnosis present

## 2018-05-15 DIAGNOSIS — E1122 Type 2 diabetes mellitus with diabetic chronic kidney disease: Secondary | ICD-10-CM | POA: Diagnosis present

## 2018-05-15 DIAGNOSIS — I255 Ischemic cardiomyopathy: Secondary | ICD-10-CM | POA: Diagnosis present

## 2018-05-15 DIAGNOSIS — Z87891 Personal history of nicotine dependence: Secondary | ICD-10-CM | POA: Diagnosis not present

## 2018-05-15 DIAGNOSIS — E1140 Type 2 diabetes mellitus with diabetic neuropathy, unspecified: Secondary | ICD-10-CM | POA: Diagnosis present

## 2018-05-15 DIAGNOSIS — K219 Gastro-esophageal reflux disease without esophagitis: Secondary | ICD-10-CM | POA: Diagnosis not present

## 2018-05-15 DIAGNOSIS — I252 Old myocardial infarction: Secondary | ICD-10-CM

## 2018-05-15 DIAGNOSIS — Z79899 Other long term (current) drug therapy: Secondary | ICD-10-CM

## 2018-05-15 DIAGNOSIS — I10 Essential (primary) hypertension: Secondary | ICD-10-CM | POA: Diagnosis present

## 2018-05-15 DIAGNOSIS — Z8547 Personal history of malignant neoplasm of testis: Secondary | ICD-10-CM

## 2018-05-15 DIAGNOSIS — Z794 Long term (current) use of insulin: Secondary | ICD-10-CM | POA: Diagnosis not present

## 2018-05-15 HISTORY — DX: Presence of prosthetic heart valve: Z95.2

## 2018-05-15 HISTORY — PX: INTRAOPERATIVE TRANSTHORACIC ECHOCARDIOGRAM: SHX6523

## 2018-05-15 HISTORY — DX: Personal history of transient ischemic attack (TIA), and cerebral infarction without residual deficits: Z86.73

## 2018-05-15 HISTORY — PX: TRANSCATHETER AORTIC VALVE REPLACEMENT, TRANSFEMORAL: SHX6400

## 2018-05-15 HISTORY — DX: Nonrheumatic aortic (valve) stenosis: I35.0

## 2018-05-15 LAB — POCT I-STAT, CHEM 8
BUN: 18 mg/dL (ref 8–23)
BUN: 18 mg/dL (ref 8–23)
BUN: 18 mg/dL (ref 8–23)
BUN: 19 mg/dL (ref 8–23)
CALCIUM ION: 1.25 mmol/L (ref 1.15–1.40)
CHLORIDE: 114 mmol/L — AB (ref 98–111)
CHLORIDE: 111 mmol/L (ref 98–111)
CREATININE: 1.1 mg/dL (ref 0.61–1.24)
Calcium, Ion: 1.18 mmol/L (ref 1.15–1.40)
Calcium, Ion: 1.19 mmol/L (ref 1.15–1.40)
Calcium, Ion: 1.23 mmol/L (ref 1.15–1.40)
Chloride: 113 mmol/L — ABNORMAL HIGH (ref 98–111)
Chloride: 111 mmol/L (ref 98–111)
Creatinine, Ser: 1.1 mg/dL (ref 0.61–1.24)
Creatinine, Ser: 1.2 mg/dL (ref 0.61–1.24)
Creatinine, Ser: 1.1 mg/dL (ref 0.61–1.24)
GLUCOSE: 121 mg/dL — AB (ref 70–99)
Glucose, Bld: 127 mg/dL — ABNORMAL HIGH (ref 70–99)
Glucose, Bld: 141 mg/dL — ABNORMAL HIGH (ref 70–99)
Glucose, Bld: 142 mg/dL — ABNORMAL HIGH (ref 70–99)
HCT: 39 % (ref 39.0–52.0)
HCT: 41 % (ref 39.0–52.0)
HEMATOCRIT: 39 % (ref 39.0–52.0)
HEMATOCRIT: 42 % (ref 39.0–52.0)
HEMOGLOBIN: 13.3 g/dL (ref 13.0–17.0)
Hemoglobin: 13.3 g/dL (ref 13.0–17.0)
Hemoglobin: 13.9 g/dL (ref 13.0–17.0)
Hemoglobin: 14.3 g/dL (ref 13.0–17.0)
POTASSIUM: 4.5 mmol/L (ref 3.5–5.1)
Potassium: 4.1 mmol/L (ref 3.5–5.1)
Potassium: 4.2 mmol/L (ref 3.5–5.1)
Potassium: 4.5 mmol/L (ref 3.5–5.1)
SODIUM: 143 mmol/L (ref 135–145)
SODIUM: 143 mmol/L (ref 135–145)
Sodium: 143 mmol/L (ref 135–145)
Sodium: 143 mmol/L (ref 135–145)
TCO2: 19 mmol/L — ABNORMAL LOW (ref 22–32)
TCO2: 20 mmol/L — AB (ref 22–32)
TCO2: 20 mmol/L — AB (ref 22–32)
TCO2: 21 mmol/L — ABNORMAL LOW (ref 22–32)

## 2018-05-15 LAB — GLUCOSE, CAPILLARY
Glucose-Capillary: 143 mg/dL — ABNORMAL HIGH (ref 70–99)
Glucose-Capillary: 136 mg/dL — ABNORMAL HIGH (ref 70–99)

## 2018-05-15 SURGERY — IMPLANTATION, AORTIC VALVE, TRANSCATHETER, FEMORAL APPROACH
Anesthesia: Monitor Anesthesia Care | Site: Chest

## 2018-05-15 MED ORDER — CLOPIDOGREL BISULFATE 75 MG PO TABS
75.0000 mg | ORAL_TABLET | Freq: Every day | ORAL | Status: DC
  Administered 2018-05-16: 75 mg via ORAL
  Filled 2018-05-15: qty 1

## 2018-05-15 MED ORDER — CHLORHEXIDINE GLUCONATE 4 % EX LIQD: 60.0000 mL | Freq: Once | CUTANEOUS | Status: DC

## 2018-05-15 MED ORDER — CHLORHEXIDINE GLUCONATE 0.12 % MT SOLN: 15.0000 mL | Freq: Once | OROMUCOSAL | Status: DC

## 2018-05-15 MED ORDER — METOPROLOL TARTRATE 5 MG/5ML IV SOLN: 2.5000 mg | INTRAVENOUS | Status: DC | PRN

## 2018-05-15 MED ORDER — LEVOTHYROXINE SODIUM 50 MCG PO TABS
50.0000 ug | ORAL_TABLET | Freq: Every day | ORAL | Status: DC
  Administered 2018-05-16: 50 ug via ORAL
  Filled 2018-05-15: qty 1

## 2018-05-15 MED ORDER — ONDANSETRON HCL 4 MG/2ML IJ SOLN
INTRAMUSCULAR | Status: DC | PRN
  Administered 2018-05-15: 4 mg via INTRAVENOUS

## 2018-05-15 MED ORDER — LIDOCAINE HCL (PF) 1 % IJ SOLN
INTRAMUSCULAR | Status: DC | PRN
  Administered 2018-05-15: 15 mL

## 2018-05-15 MED ORDER — SODIUM CHLORIDE 0.9 % IV SOLN
INTRAVENOUS | Status: AC
  Administered 2018-05-15: 14:00:00 via INTRAVENOUS

## 2018-05-15 MED ORDER — LACTATED RINGERS IV SOLN
INTRAVENOUS | Status: DC
  Administered 2018-05-15: 10:00:00 via INTRAVENOUS

## 2018-05-15 MED ORDER — MORPHINE SULFATE (PF) 10 MG/ML IV SOLN: 2.0000 mg | INTRAVENOUS | Status: DC | PRN

## 2018-05-15 MED ORDER — PROTAMINE SULFATE 10 MG/ML IV SOLN
INTRAVENOUS | Status: DC | PRN
  Administered 2018-05-15 (×3): 20 mg via INTRAVENOUS
  Administered 2018-05-15 (×2): 10 mg via INTRAVENOUS

## 2018-05-15 MED ORDER — ROCURONIUM BROMIDE 50 MG/5ML IV SOSY
PREFILLED_SYRINGE | INTRAVENOUS | Status: AC
  Filled 2018-05-15: qty 5

## 2018-05-15 MED ORDER — IODIXANOL 320 MG/ML IV SOLN
INTRAVENOUS | Status: DC | PRN
  Administered 2018-05-15: 60.8 mL via INTRA_ARTERIAL

## 2018-05-15 MED ORDER — PROPOFOL 500 MG/50ML IV EMUL
INTRAVENOUS | Status: DC | PRN
  Administered 2018-05-15: 10 ug/kg/min via INTRAVENOUS

## 2018-05-15 MED ORDER — SODIUM CHLORIDE 0.9 % IV SOLN
INTRAVENOUS | Status: DC | PRN
  Administered 2018-05-15: 1500 mL

## 2018-05-15 MED ORDER — MORPHINE SULFATE (PF) 2 MG/ML IV SOLN: 2.0000 mg | INTRAVENOUS | Status: DC | PRN

## 2018-05-15 MED ORDER — SODIUM CHLORIDE 0.9 % IV SOLN
INTRAVENOUS | Status: DC
  Administered 2018-05-15: 12:00:00 via INTRAVENOUS

## 2018-05-15 MED ORDER — MIDAZOLAM HCL 2 MG/2ML IJ SOLN
INTRAMUSCULAR | Status: AC
  Administered 2018-05-15: 2 mg via INTRAVENOUS
  Filled 2018-05-15: qty 2

## 2018-05-15 MED ORDER — SIMVASTATIN 40 MG PO TABS
40.0000 mg | ORAL_TABLET | Freq: Every evening | ORAL | Status: DC
  Administered 2018-05-15: 40 mg via ORAL
  Filled 2018-05-15: qty 1

## 2018-05-15 MED ORDER — ACETAMINOPHEN 325 MG PO TABS: 650.0000 mg | ORAL_TABLET | Freq: Four times a day (QID) | ORAL | Status: DC | PRN

## 2018-05-15 MED ORDER — HEPARIN SODIUM (PORCINE) 1000 UNIT/ML IJ SOLN
INTRAMUSCULAR | Status: DC | PRN
  Administered 2018-05-15: 8000 [IU] via INTRAVENOUS

## 2018-05-15 MED ORDER — LIDOCAINE HCL 1 % IJ SOLN
INTRAMUSCULAR | Status: AC
  Filled 2018-05-15: qty 20

## 2018-05-15 MED ORDER — MIDAZOLAM HCL 2 MG/2ML IJ SOLN
2.0000 mg | Freq: Once | INTRAMUSCULAR | Status: AC
  Administered 2018-05-15: 2 mg via INTRAVENOUS

## 2018-05-15 MED ORDER — INSULIN ASPART 100 UNIT/ML ~~LOC~~ SOLN: 0.0000 [IU] | Freq: Three times a day (TID) | SUBCUTANEOUS | Status: DC

## 2018-05-15 MED ORDER — ONDANSETRON HCL 4 MG/2ML IJ SOLN: 4.0000 mg | Freq: Four times a day (QID) | INTRAMUSCULAR | Status: DC | PRN

## 2018-05-15 MED ORDER — SODIUM CHLORIDE 0.9 % IV SOLN
INTRAVENOUS | Status: AC
  Filled 2018-05-15 (×3): qty 1.2

## 2018-05-15 MED ORDER — HEPARIN SODIUM (PORCINE) 1000 UNIT/ML IJ SOLN
INTRAMUSCULAR | Status: AC
  Filled 2018-05-15: qty 4

## 2018-05-15 MED ORDER — ASPIRIN 81 MG PO CHEW
81.0000 mg | CHEWABLE_TABLET | Freq: Every day | ORAL | Status: DC
  Administered 2018-05-16: 81 mg via ORAL
  Filled 2018-05-15: qty 1

## 2018-05-15 MED ORDER — NITROGLYCERIN IN D5W 200-5 MCG/ML-% IV SOLN: 0.0000 ug/min | INTRAVENOUS | Status: DC

## 2018-05-15 MED ORDER — AMITRIPTYLINE HCL 100 MG PO TABS
100.0000 mg | ORAL_TABLET | Freq: Every day | ORAL | Status: DC
  Administered 2018-05-15: 100 mg via ORAL
  Filled 2018-05-15: qty 1
  Filled 2018-05-15: qty 2

## 2018-05-15 MED ORDER — PROPOFOL 10 MG/ML IV BOLUS
INTRAVENOUS | Status: DC | PRN
  Administered 2018-05-15: 10 mg via INTRAVENOUS

## 2018-05-15 MED ORDER — VANCOMYCIN HCL IN DEXTROSE 1-5 GM/200ML-% IV SOLN
1000.0000 mg | Freq: Once | INTRAVENOUS | Status: AC
  Administered 2018-05-15: 1000 mg via INTRAVENOUS
  Filled 2018-05-15: qty 200

## 2018-05-15 MED ORDER — 0.9 % SODIUM CHLORIDE (POUR BTL) OPTIME
TOPICAL | Status: DC | PRN
  Administered 2018-05-15: 1000 mL

## 2018-05-15 MED ORDER — INSULIN PUMP
Freq: Three times a day (TID) | SUBCUTANEOUS | Status: DC
  Administered 2018-05-15: 7.1 via SUBCUTANEOUS
  Administered 2018-05-16: 8 via SUBCUTANEOUS
  Filled 2018-05-15: qty 1

## 2018-05-15 MED ORDER — SODIUM CHLORIDE 0.9 % IV SOLN: 250.0000 mL | INTRAVENOUS | Status: DC | PRN

## 2018-05-15 MED ORDER — SODIUM CHLORIDE 0.9 % IV SOLN
1.5000 g | Freq: Two times a day (BID) | INTRAVENOUS | Status: DC
  Administered 2018-05-15 – 2018-05-16 (×2): 1.5 g via INTRAVENOUS
  Filled 2018-05-15 (×2): qty 1.5

## 2018-05-15 MED ORDER — PROTAMINE SULFATE 10 MG/ML IV SOLN
INTRAVENOUS | Status: AC
  Filled 2018-05-15: qty 10

## 2018-05-15 MED ORDER — FENTANYL CITRATE (PF) 100 MCG/2ML IJ SOLN
100.0000 ug | Freq: Once | INTRAMUSCULAR | Status: AC
  Administered 2018-05-15: 100 ug via INTRAVENOUS

## 2018-05-15 MED ORDER — FENTANYL CITRATE (PF) 100 MCG/2ML IJ SOLN
INTRAMUSCULAR | Status: AC
  Administered 2018-05-15: 100 ug via INTRAVENOUS
  Filled 2018-05-15: qty 2

## 2018-05-15 MED ORDER — ACETAMINOPHEN 650 MG RE SUPP: 650.0000 mg | Freq: Four times a day (QID) | RECTAL | Status: DC | PRN

## 2018-05-15 MED ORDER — OXYCODONE HCL 5 MG PO TABS: 5.0000 mg | ORAL_TABLET | ORAL | Status: DC | PRN

## 2018-05-15 MED ORDER — SODIUM CHLORIDE 0.9% FLUSH
3.0000 mL | Freq: Two times a day (BID) | INTRAVENOUS | Status: DC
  Administered 2018-05-15 – 2018-05-16 (×2): 3 mL via INTRAVENOUS

## 2018-05-15 MED ORDER — CHLORHEXIDINE GLUCONATE 4 % EX LIQD: 30.0000 mL | CUTANEOUS | Status: DC

## 2018-05-15 MED ORDER — TRAMADOL HCL 50 MG PO TABS: 50.0000 mg | ORAL_TABLET | ORAL | Status: DC | PRN

## 2018-05-15 MED ORDER — SODIUM CHLORIDE 0.9% FLUSH: 3.0000 mL | INTRAVENOUS | Status: DC | PRN

## 2018-05-15 MED ORDER — LIDOCAINE 2% (20 MG/ML) 5 ML SYRINGE
INTRAMUSCULAR | Status: AC
  Filled 2018-05-15: qty 5

## 2018-05-15 MED ORDER — ONDANSETRON HCL 4 MG/2ML IJ SOLN
INTRAMUSCULAR | Status: AC
  Filled 2018-05-15: qty 4

## 2018-05-15 SURGICAL SUPPLY — 87 items
BAG DECANTER FOR FLEXI CONT (MISCELLANEOUS) IMPLANT
BAG SNAP BAND KOVER 36X36 (MISCELLANEOUS) ×4 IMPLANT
BLADE CLIPPER SURG (BLADE) IMPLANT
BLADE OSCILLATING /SAGITTAL (BLADE) IMPLANT
BLADE STERNUM SYSTEM 6 (BLADE) IMPLANT
CABLE ADAPT CONN TEMP 6FT (ADAPTER) ×4 IMPLANT
CANNULA FEM VENOUS REMOTE 22FR (CANNULA) IMPLANT
CANNULA OPTISITE PERFUSION 16F (CANNULA) IMPLANT
CANNULA OPTISITE PERFUSION 18F (CANNULA) IMPLANT
CATH DIAG EXPO 6F VENT PIG 145 (CATHETERS) ×8 IMPLANT
CATH EXPO 5FR AL1 (CATHETERS) IMPLANT
CATH INFINITI 6F AL2 (CATHETERS) IMPLANT
CATH S G BIP PACING (SET/KITS/TRAYS/PACK) ×4 IMPLANT
CLIP VESOCCLUDE MED 24/CT (CLIP) IMPLANT
CLIP VESOCCLUDE SM WIDE 24/CT (CLIP) IMPLANT
CONT SPEC 4OZ CLIKSEAL STRL BL (MISCELLANEOUS) ×8 IMPLANT
COVER BACK TABLE 24X17X13 BIG (DRAPES) IMPLANT
COVER BACK TABLE 80X110 HD (DRAPES) ×4 IMPLANT
COVER DOME SNAP 22 D (MISCELLANEOUS) IMPLANT
COVER WAND RF STERILE (DRAPES) ×4 IMPLANT
CRADLE DONUT ADULT HEAD (MISCELLANEOUS) ×4 IMPLANT
DERMABOND ADVANCED (GAUZE/BANDAGES/DRESSINGS) ×2
DERMABOND ADVANCED .7 DNX12 (GAUZE/BANDAGES/DRESSINGS) ×2 IMPLANT
DEVICE CLOSURE PERCLS PRGLD 6F (VASCULAR PRODUCTS) ×4 IMPLANT
DRAPE INCISE IOBAN 66X45 STRL (DRAPES) IMPLANT
DRSG TEGADERM 4X4.75 (GAUZE/BANDAGES/DRESSINGS) ×8 IMPLANT
ELECT CAUTERY BLADE 6.4 (BLADE) IMPLANT
ELECT REM PT RETURN 9FT ADLT (ELECTROSURGICAL) ×8
ELECTRODE REM PT RTRN 9FT ADLT (ELECTROSURGICAL) ×4 IMPLANT
FELT TEFLON 6X6 (MISCELLANEOUS) IMPLANT
FEMORAL VENOUS CANN RAP (CANNULA) IMPLANT
GAUZE SPONGE 4X4 12PLY STRL (GAUZE/BANDAGES/DRESSINGS) ×4 IMPLANT
GAUZE SPONGE 4X4 12PLY STRL LF (GAUZE/BANDAGES/DRESSINGS) ×4 IMPLANT
GLOVE BIO SURGEON STRL SZ7.5 (GLOVE) ×4 IMPLANT
GLOVE BIO SURGEON STRL SZ8 (GLOVE) IMPLANT
GLOVE EUDERMIC 7 POWDERFREE (GLOVE) IMPLANT
GLOVE ORTHO TXT STRL SZ7.5 (GLOVE) IMPLANT
GOWN STRL REUS W/ TWL LRG LVL3 (GOWN DISPOSABLE) IMPLANT
GOWN STRL REUS W/ TWL XL LVL3 (GOWN DISPOSABLE) ×2 IMPLANT
GOWN STRL REUS W/TWL LRG LVL3 (GOWN DISPOSABLE)
GOWN STRL REUS W/TWL XL LVL3 (GOWN DISPOSABLE) ×2
GUIDEWIRE SAFE TJ AMPLATZ EXST (WIRE) ×4 IMPLANT
GUIDEWIRE STRAIGHT .035 260CM (WIRE) ×4 IMPLANT
INSERT FOGARTY SM (MISCELLANEOUS) IMPLANT
KIT BASIN OR (CUSTOM PROCEDURE TRAY) ×4 IMPLANT
KIT DILATOR VASC 18G NDL (KITS) IMPLANT
KIT HEART LEFT (KITS) ×4 IMPLANT
KIT SUCTION CATH 14FR (SUCTIONS) IMPLANT
KIT TURNOVER KIT B (KITS) ×4 IMPLANT
LOOP VESSEL MAXI BLUE (MISCELLANEOUS) IMPLANT
LOOP VESSEL MINI RED (MISCELLANEOUS) IMPLANT
NEEDLE 22X1 1/2 (OR ONLY) (NEEDLE) IMPLANT
NEEDLE PERC 18GX7CM (NEEDLE) ×4 IMPLANT
NS IRRIG 1000ML POUR BTL (IV SOLUTION) ×4 IMPLANT
PACK ENDOVASCULAR (PACKS) ×4 IMPLANT
PAD ARMBOARD 7.5X6 YLW CONV (MISCELLANEOUS) ×8 IMPLANT
PAD ELECT DEFIB RADIOL ZOLL (MISCELLANEOUS) ×4 IMPLANT
PENCIL BUTTON HOLSTER BLD 10FT (ELECTRODE) IMPLANT
PERCLOSE PROGLIDE 6F (VASCULAR PRODUCTS) ×8
SET MICROPUNCTURE 5F STIFF (MISCELLANEOUS) ×4 IMPLANT
SHEATH BRITE TIP 6FR 35CM (SHEATH) ×4 IMPLANT
SHEATH PINNACLE 6F 10CM (SHEATH) ×4 IMPLANT
SHEATH PINNACLE 8F 10CM (SHEATH) ×4 IMPLANT
SLEEVE REPOSITIONING LENGTH 30 (MISCELLANEOUS) ×4 IMPLANT
SPONGE LAP 4X18 RFD (DISPOSABLE) IMPLANT
STOPCOCK MORSE 400PSI 3WAY (MISCELLANEOUS) ×8 IMPLANT
SUT ETHIBOND X763 2 0 SH 1 (SUTURE) IMPLANT
SUT GORETEX CV 4 TH 22 36 (SUTURE) IMPLANT
SUT GORETEX CV4 TH-18 (SUTURE) IMPLANT
SUT MNCRL AB 3-0 PS2 18 (SUTURE) IMPLANT
SUT PROLENE 5 0 C 1 36 (SUTURE) IMPLANT
SUT PROLENE 6 0 C 1 30 (SUTURE) IMPLANT
SUT SILK  1 MH (SUTURE) ×2
SUT SILK 1 MH (SUTURE) ×2 IMPLANT
SUT VIC AB 2-0 CT1 27 (SUTURE)
SUT VIC AB 2-0 CT1 TAPERPNT 27 (SUTURE) IMPLANT
SUT VIC AB 2-0 CTX 36 (SUTURE) IMPLANT
SUT VIC AB 3-0 SH 8-18 (SUTURE) IMPLANT
SYR 50ML LL SCALE MARK (SYRINGE) ×4 IMPLANT
SYR BULB IRRIGATION 50ML (SYRINGE) IMPLANT
SYR CONTROL 10ML LL (SYRINGE) IMPLANT
TAPE CLOTH SURG 4X10 WHT LF (GAUZE/BANDAGES/DRESSINGS) ×4 IMPLANT
TOWEL GREEN STERILE (TOWEL DISPOSABLE) ×8 IMPLANT
TRANSDUCER W/STOPCOCK (MISCELLANEOUS) ×8 IMPLANT
TRAY FOLEY SLVR 16FR TEMP STAT (SET/KITS/TRAYS/PACK) IMPLANT
VALVE HEART TRANSCATH SZ3 29MM (Prosthesis & Implant Heart) ×4 IMPLANT
WIRE .035 3MM-J 145CM (WIRE) ×4 IMPLANT

## 2018-05-15 NOTE — Op Note (Signed)
[facility]   MULTIDISCIPLINARY HEART VALVE TEAM   TAVR OPERATIVE NOTE   Date of Procedure:  05/15/2018  Preoperative Diagnosis: Severe Aortic Stenosis   Postoperative Diagnosis: Same   Procedure:    Transcatheter Aortic Valve Replacement - Percutaneous Right Transfemoral Approach  Edwards Sapien 3 THV (size 29 mm, model # 9600TFX, serial # [id number])   Co-Surgeons:  Lauree Chandler, MD and Valentina Gu. Roxy Manns, MD  Anesthesiologist:  Albertha Ghee, MD  Echocardiographer:  Sanda Klein, MD  Pre-operative Echo Findings:  Severe aortic stenosis  Moderate-severe left ventricular systolic dysfunction  Post-operative Echo Findings:  No paravalvular leak  Unchanged left ventricular systolic function   BRIEF CLINICAL NOTE AND INDICATIONS FOR SURGERY  Patient is a 67 year old male with history of mediastinal seminoma treated with surgical resection followed by radiation therapy, coronary artery disease status post coronary artery bypass grafting x2 in 2014, chronic combined systolic and diastolic congestive heart failure with recent acute exacerbation, aortic stenosis, previous stroke, hypertension, type 2 diabetes mellitus, stage III chronic kidney disease, and hyperlipidemia who has been referred for surgical consultation to discuss treatment options for management of low gradient low ejection fraction severe symptomatic aortic stenosis.  Patient had history of mediastinal seminoma for which she underwent median sternotomy for surgical resection in 1985.  Postoperatively he was treated with mediastinal radiation therapy.  In 2013 he presented with an acute non-ST segment elevation myocardial infarction.  He was found to have multivessel coronary artery disease and treated with PCI and stenting of the left circumflex and obtuse marginal branch.  Within a year he developed critical in-stent restenosis and he subsequently underwent coronary artery bypass grafting x2  by Dr. Prescott Gum.  Grafts placed at the time of surgery included reverse saphenous vein graft to the obtuse marginal branch of left circumflex into the posterior descending branch of the distal right coronary artery.  Both the right internal mammary artery and the left radial artery were harvested at the time of surgery but felt to be poor quality and not utilized.  The patient was also noted to have severe chronic scarring throughout the mediastinum related to previous surgery and mediastinal radiation therapy.  Patient did well following coronary artery bypass surgery and has been followed closely ever since by Dr. Radford Pax.  Echocardiograms have documented the presence of aortic stenosis with moderate to severe left ventricular systolic dysfunction with ejection fraction estimated 30 to 35%.  Transvalvular gradients across the aortic valve have suggested the presence of at least moderate aortic stenosis with mean transvalvular gradient estimated 20 mmHg, DVI reported 0.22 and aortic valve area calculated 1.02 cm.  Over the past few months the patient developed fairly rapid progression of symptoms of exertional shortness of breath, weight gain, orthopnea, PND, and lower extremity edema.  He was hospitalized in August with acute exacerbation of congestive heart failure.  He responded to IV diuresis.  He was evaluated in consultation by Dr. Angelena Form and underwent diagnostic cardiac catheterization by Dr. Haroldine Laws on June 13, 2018.  Catheterization revealed moderate nonobstructive disease in the left anterior descending coronary artery with continued patency of the vein grafts to the left circumflex and right coronary territories.  The patient stabilized medically and was discharged home.  He has undergone CT angiography and has now been referred for surgical consultation.  During the course of the patient's preoperative work up they have been evaluated comprehensively by a multidisciplinary team of  specialists coordinated through the [facility] in the  [facility].  They have been demonstrated to suffer from symptomatic severe aortic stenosis as noted above. The patient has been counseled extensively as to the relative risks and benefits of all options for the treatment of severe aortic stenosis including long term medical therapy, conventional surgery for aortic valve replacement, and transcatheter aortic valve replacement.  All questions have been answered, and the patient provides full informed consent for the operation as described.   DETAILS OF THE OPERATIVE PROCEDURE  PREPARATION:    The patient is brought to the operating room on the above mentioned date and central monitoring was established by the anesthesia team including placement of a central venous line and radial arterial line. The patient is placed in the supine position on the operating table.  Intravenous antibiotics are administered. The patient is monitored closely throughout the procedure under conscious sedation.  Baseline transthoracic echocardiogram was performed. The patient's chest, abdomen, both groins, and both lower extremities are prepared and draped in a sterile manner. A time out procedure is performed.   PERIPHERAL ACCESS:    Using the modified Seldinger technique, femoral arterial and venous access was obtained with placement of 6 Fr sheaths on the left side.  A pigtail diagnostic catheter was passed through the left arterial sheath under fluoroscopic guidance into the aortic root.  A temporary transvenous pacemaker catheter was passed through the left femoral venous sheath under fluoroscopic guidance into the right ventricle.  The pacemaker was tested to ensure stable lead placement and pacemaker capture. Aortic root angiography was performed in order to determine the optimal angiographic angle for valve deployment.   TRANSFEMORAL ACCESS:   Percutaneous  transfemoral access and sheath placement was performed by Dr. Angelena Form using ultrasound guidance.  The right common femoral artery was cannulated using a micropuncture needle and appropriate location was verified using hand injection angiogram.  A pair of Abbott Perclose percutaneous closure devices were placed and a 6 French sheath replaced into the femoral artery.  The patient was heparinized systemically and ACT verified > 250 seconds.    A 16 Fr transfemoral E-sheath was introduced into the right common femoral artery after progressively dilating over an Amplatz superstiff wire. An AL-2 catheter was used to direct a straight-tip exchange length wire across the native aortic valve into the left ventricle. This was exchanged out for a pigtail catheter and position was confirmed in the LV apex. Simultaneous LV and Ao pressures were recorded.  The pigtail catheter was exchanged for an Amplatz Extra-stiff wire in the LV apex.  Echocardiography was utilized to confirm appropriate wire position and no sign of entanglement in the mitral subvalvular apparatus.   TRANSCATHETER HEART VALVE DEPLOYMENT:   An Edwards Sapien 3 transcatheter heart valve (size 29 mm, model #9600TFX, serial #[id number]) was prepared and crimped per manufacturer's guidelines, and the proper orientation of the valve is confirmed on the [employer] delivery system. The valve was advanced through the introducer sheath using normal technique until in an appropriate position in the abdominal aorta beyond the sheath tip. The balloon was then retracted and using the fine-tuning wheel was centered on the valve. The valve was then advanced across the aortic arch using appropriate flexion of the catheter. The valve was carefully positioned across the aortic valve annulus. The Commander catheter was retracted using normal technique. Once final position of the valve has been confirmed by angiographic assessment, the valve is deployed while  temporarily holding ventilation and during rapid ventricular pacing to maintain  systolic blood pressure < 50 mmHg and pulse pressure < 10 mmHg. The balloon inflation is held for >3 seconds after reaching full deployment volume. Once the balloon has fully deflated the balloon is retracted into the ascending aorta and valve function is assessed using echocardiography. There is felt to be no paravalvular leak and no central aortic insufficiency.  The patient's hemodynamic recovery following valve deployment is good.  The deployment balloon and guidewire are both removed.    PROCEDURE COMPLETION:   The sheath was removed and femoral artery closure performed by Dr Angelena Form.  Protamine was administered once femoral arterial repair was complete. The temporary pacemaker, pigtail catheters and femoral sheaths were removed with manual pressure used for hemostasis.   The patient tolerated the procedure well and is transported to the surgical intensive care in stable condition. There were no immediate intraoperative complications. All sponge instrument and needle counts are verified correct at completion of the operation.   No blood products were administered during the operation.  The patient received a total of 60.8 mL of intravenous contrast during the procedure.   Rexene Alberts, MD 05/15/2018 1:51 PM

## 2018-05-15 NOTE — Progress Notes (Signed)
Right radial arterial line was discontinued.  Pt had been off levophed drip since 1440.  Site is a level 0.  Transparent dressing applied to site - cdi.

## 2018-05-15 NOTE — Plan of Care (Signed)
Patient able to begin ambulation after TAVR procedure. Patient has good appetite.

## 2018-05-15 NOTE — Anesthesia Procedure Notes (Addendum)
Procedure Name: MAC Date/Time: 05/15/2018 12:19 PM Performed by: Inda Coke, CRNA Pre-anesthesia Checklist: Patient identified, Emergency Drugs available, Suction available, Timeout performed and Patient being monitored Patient Re-evaluated:Patient Re-evaluated prior to induction Oxygen Delivery Method: Simple face mask Induction Type: IV induction Dental Injury: Teeth and Oropharynx as per pre-operative assessment

## 2018-05-15 NOTE — Progress Notes (Signed)
  [place] VALVE TEAM  Patient doing well s/p TAVR. He is hemodynamically stable. L groin had hematoma but much improved s/p ~30 minutes of compression in cath lab holding area. ECG shows new intraventricular conduction delay and 1st deg AV block. Arterial line discontinued and transferred to the 4E. Plan for early ambulation and hopeful discharge over the next 24-48 hours if no conduction disturbance.   Angelena Form PA-C  [facility]  Pager [phone]

## 2018-05-15 NOTE — Progress Notes (Signed)
Insulin pump contract reviewed with pt. Contract signed and pt agreeable to checking CBG with hospital meter and understands that he is responsible for administering his own insulin. Will continue to monitor.  Clyde Canterbury, RN

## 2018-05-15 NOTE — Progress Notes (Signed)
Patient interviewed in the preop area. Patient able to confirm name, DOB, procedure, allergies, no metal in body, npo status and no pain at this time.   Leatha Gilding, RN

## 2018-05-15 NOTE — Progress Notes (Signed)
Report called to receiving nurse.  Advised nurse of new bedrest time which starts at 1515 due to pressure needing to be held.

## 2018-05-15 NOTE — Anesthesia Procedure Notes (Signed)
Central Venous Catheter Insertion Performed by: Albertha Ghee, MD, anesthesiologist Start/End10/08/2017 11:10 AM, 05/15/2018 11:22 AM Patient location: Pre-op. Preanesthetic checklist: patient identified, IV checked, site marked, risks and benefits discussed, surgical consent, monitors and equipment checked, pre-op evaluation, timeout performed and anesthesia consent Position: Trendelenburg Lidocaine 1% used for infiltration and patient sedated Hand hygiene performed , maximum sterile barriers used  and Seldinger technique used Catheter size: 7 Fr Central line was placed.Double lumen Procedure performed using ultrasound guided technique. Ultrasound Notes:anatomy identified, needle tip was noted to be adjacent to the nerve/plexus identified and no ultrasound evidence of intravascular and/or intraneural injection Attempts: 1 Following insertion, line sutured, dressing applied and Biopatch. Post procedure assessment: blood return through all ports, free fluid flow and no air  Patient tolerated the procedure well with no immediate complications.

## 2018-05-15 NOTE — Progress Notes (Signed)
CBG checked on pts freestyle glucometer. CBG 136.

## 2018-05-15 NOTE — Progress Notes (Signed)
Manual pressure held to left femoral site for 35 minutes by Chip Ouida Sills, RN.  Site is a level 1.

## 2018-05-15 NOTE — Progress Notes (Signed)
Inpatient Diabetes Program Recommendations  AACE/ADA: New Consensus Statement on Inpatient Glycemic Control (2015)  Target Ranges:  Prepandial:   less than 140 mg/dL      Peak postprandial:   less than 180 mg/dL (1-2 hours)      Critically ill patients:  140 - 180 mg/dL   Lab Results  Component Value Date   GLUCAP 143 (H) 05/15/2018   HGBA1C 7.6 (H) 05/11/2018    Review of Glycemic Control Results for CARR, SHARTZER (MRN [id number]) as of 05/15/2018 16:44  Ref. Range 05/15/2018 10:08  Glucose-Capillary Latest Ref Range: 70 - 99 mg/dL 143 (H)   Diabetes history: DM Outpatient Diabetes medications:  Insulin pump 0.8 units/hr- Metformin 1000 mg bid Current orders for Inpatient glycemic control:  Insulin pump-- Inpatient Diabetes Program Recommendations:    Call received from RN regarding patient with insulin pump.  Placed order set per protocol. Needs insulin pump contract.  Will discuss with RN where to find it.   Thanks  Adah Perl, RN, BC-ADM Inpatient Diabetes Coordinator Pager [phone] (8a-5p)

## 2018-05-15 NOTE — Progress Notes (Signed)
  Echocardiogram 2D Echocardiogram has been performed.  Roseanna Rainbow R 05/15/2018, 2:09 PM

## 2018-05-15 NOTE — Anesthesia Preprocedure Evaluation (Signed)
Anesthesia Evaluation  Patient identified by MRN, date of birth, ID band Patient awake    Reviewed: Allergy & Precautions, H&P , NPO status , Patient's Chart, lab work & pertinent test results  Airway Mallampati: II   Neck ROM: full    Dental   Pulmonary former smoker,    breath sounds clear to auscultation       Cardiovascular hypertension, + CAD, + CABG and +CHF  + Valvular Problems/Murmurs AS  Rhythm:regular Rate:Normal     Neuro/Psych CVA    GI/Hepatic GERD  ,  Endo/Other  diabetes, Insulin Dependent  Renal/GU      Musculoskeletal  (+) Arthritis ,   Abdominal   Peds  Hematology   Anesthesia Other Findings   Reproductive/Obstetrics                             Anesthesia Physical Anesthesia Plan  ASA: III  Anesthesia Plan: MAC   Post-op Pain Management:    Induction: Intravenous  PONV Risk Score and Plan: 1 and Ondansetron, Dexamethasone and Treatment may vary due to age or medical condition  Airway Management Planned: Simple Face Mask  Additional Equipment:   Intra-op Plan:   Post-operative Plan:   Informed Consent: I have reviewed the patients History and Physical, chart, labs and discussed the procedure including the risks, benefits and alternatives for the proposed anesthesia with the patient or authorized representative who has indicated his/her understanding and acceptance.     Plan Discussed with: CRNA, Anesthesiologist and Surgeon  Anesthesia Plan Comments:         Anesthesia Quick Evaluation

## 2018-05-15 NOTE — Transfer of Care (Signed)
Immediate Anesthesia Transfer of Care Note  Patient: [name]  Procedure(s) Performed: TRANSCATHETER AORTIC VALVE REPLACEMENT, TRANSFEMORAL (N/A Chest) INTRAOPERATIVE TRANSTHORACIC ECHOCARDIOGRAM (N/A )  Patient Location: PACU and Cath Lab  Anesthesia Type:MAC  Level of Consciousness: awake, alert  and oriented  Airway & Oxygen Therapy: Patient Spontanous Breathing and Patient connected to nasal cannula oxygen  Post-op Assessment: Report given to RN and Post -op Vital signs reviewed and stable  Post vital signs: Reviewed and stable  Last Vitals:  Vitals Value Taken Time  BP 117/76 05/15/2018  2:02 PM  Temp    Pulse 88 05/15/2018  2:04 PM  Resp 16 05/15/2018  2:04 PM  SpO2 98 % 05/15/2018  2:04 PM  Vitals shown include unvalidated device data.  Last Pain:  Vitals:   05/15/18 1047  TempSrc:   PainSc: 0-No pain      Patients Stated Pain Goal: 3 (65/79/03 8333)  Complications: No apparent anesthesia complications

## 2018-05-15 NOTE — Progress Notes (Signed)
Pt received from cath lab. VSS. Telemetry applied. CHG complete. Pt and family oriented to room and unit. Dinner tray ordered. Will continue to monitor.  Clyde Canterbury, RN

## 2018-05-15 NOTE — Interval H&P Note (Signed)
History and Physical Interval Note:  05/15/2018 12:00 PM  [name]  has presented today for surgery, with the diagnosis of Severe Aortic Stenosis  The various methods of treatment have been discussed with the patient and family. After consideration of risks, benefits and other options for treatment, the patient has consented to  Procedure(s): TRANSCATHETER AORTIC VALVE REPLACEMENT, TRANSFEMORAL (N/A) TRANSESOPHAGEAL ECHOCARDIOGRAM (TEE) (N/A) as a surgical intervention .  The patient's history has been reviewed, patient examined, no change in status, stable for surgery.  I have reviewed the patient's chart and labs.  Questions were answered to the patient's satisfaction.     Lauree Chandler

## 2018-05-15 NOTE — CV Procedure (Signed)
[facility]  TAVR OPERATIVE NOTE   Date of Procedure:  05/15/2018  Preoperative Diagnosis: Severe Aortic Stenosis   Postoperative Diagnosis: Same   Procedure:    Transcatheter Aortic Valve Replacement - Transfemoral Approach  Edwards Sapien 3 THV (size 29 mm, model # [id number], serial # [id number])   Co-Surgeons:  Lauree Chandler, MD and Valentina Gu. Roxy Manns, MD  Anesthesiologist:  Hodierne  Echocardiographer:  Croitoru  Pre-operative Echo Findings:  Severe aortic stenosis  Moderate left ventricular systolic dysfunction  Post-operative Echo Findings:  No paravalvular leak  Moderate left ventricular systolic dysfunction  BRIEF CLINICAL NOTE AND INDICATIONS FOR SURGERY  67 yo male with history of CVA, CAD s/p stenting and CABG x 2 in 2014, HTN, HLD, DM2, CKD stage 2, chronic combined systolic and diastolic chf, severe AS and ischemic cardiomyopathy here today for TAVR. He has history of mediastinal seminoma that was resected in 1985. Echo August 2019 with LVEF=30-35%, heavily calcified aortic valve leaflets with restricted motion of leaflets. Felt to have severe AS.   During the course of the patient's preoperative work up they have been evaluated comprehensively by a multidisciplinary team of specialists coordinated through the [facility] in the [facility].  They have been demonstrated to suffer from symptomatic severe aortic stenosis as noted above. The patient has been counseled extensively as to the relative risks and benefits of all options for the treatment of severe aortic stenosis including long term medical therapy, conventional surgery for aortic valve replacement, and transcatheter aortic valve replacement.  The patient has been independently evaluated by two cardiac surgeons including Dr Roxy Manns and Dr. Cyndia Bent, and they are felt to be at high risk for conventional surgical aortic valve replacement. Both  surgeons indicated the patient would be a poor candidate for conventional surgery. Based upon review of all of the patient's preoperative diagnostic tests they are felt to be candidate for transcatheter aortic valve replacement using the transfemoral approach as an alternative to high risk conventional surgery.    Following the decision to proceed with transcatheter aortic valve replacement, a discussion has been held regarding what types of management strategies would be attempted intraoperatively in the event of life-threatening complications, including whether or not the patient would be considered a candidate for the use of cardiopulmonary bypass and/or conversion to open sternotomy for attempted surgical intervention.  The patient has been advised of a variety of complications that might develop peculiar to this approach including but not limited to risks of death, stroke, paravalvular leak, aortic dissection or other major vascular complications, aortic annulus rupture, device embolization, cardiac rupture or perforation, acute myocardial infarction, arrhythmia, heart block or bradycardia requiring permanent pacemaker placement, congestive heart failure, respiratory failure, renal failure, pneumonia, infection, other late complications related to structural valve deterioration or migration, or other complications that might ultimately cause a temporary or permanent loss of functional independence or other long term morbidity.  The patient provides full informed consent for the procedure as described and all questions were answered preoperatively.    DETAILS OF THE OPERATIVE PROCEDURE  PREPARATION:   The patient is brought to the operating room on the above mentioned date and central monitoring was established by the anesthesia team including placement of a radial arterial line. The patient is placed in the supine position on the operating table.  Intravenous antibiotics are administered. Conscious  sedation is used.   Baseline transthoracic echocardiogram was performed. The patient's chest, abdomen, both groins, and  both lower extremities are prepared and draped in a sterile manner. A time out procedure is performed.   PERIPHERAL ACCESS:   Using the modified Seldinger technique, femoral arterial and venous access were obtained with placement of 6 Fr sheaths on the left side.  A pigtail diagnostic catheter was passed through the femoral arterial sheath under fluoroscopic guidance into the aortic root.  A temporary transvenous pacemaker catheter was passed through the femoral venous sheath under fluoroscopic guidance into the right ventricle.  The pacemaker was tested to ensure stable lead placement and pacemaker capture. Aortic root angiography was performed in order to determine the optimal angiographic angle for valve deployment.  TRANSFEMORAL ACCESS:  A micropuncture kit was used to gain access to the right femoral artery. Position confirmed with angiography. Pre-closure with double ProGlide closure devices. The patient was heparinized systemically and ACT verified > 250 seconds.    A 16 Fr transfemoral E-sheath was introduced into the right femoral artery after progressively dilating over an Amplatz superstiff wire. An AL-2 catheter was used to direct a straight-tip exchange length wire across the native aortic valve into the left ventricle. This was exchanged out for a pigtail catheter and position was confirmed in the LV apex. Simultaneous LV and Ao pressures were recorded.  The pigtail catheter was then exchanged for an Amplatz Extra-stiff wire in the LV apex.   TRANSCATHETER HEART VALVE DEPLOYMENT:  An Edwards Sapien 3 THV (size 29 mm) was prepared and crimped per manufacturer's guidelines, and the proper orientation of the valve is confirmed on the [employer] delivery system. The valve was advanced through the introducer sheath using normal technique until in an appropriate  position in the abdominal aorta beyond the sheath tip. The balloon was then retracted and using the fine-tuning wheel was centered on the valve. The valve was then advanced across the aortic arch using appropriate flexion of the catheter. The valve was carefully positioned across the aortic valve annulus. The Commander catheter was retracted using normal technique. Once final position of the valve has been confirmed by angiographic assessment, the valve is deployed while temporarily holding ventilation and during rapid ventricular pacing to maintain systolic blood pressure < 50 mmHg and pulse pressure < 10 mmHg. The balloon inflation is held for >3 seconds after reaching full deployment volume. Once the balloon has fully deflated the balloon is retracted into the ascending aorta and valve function is assessed using TTE. There is felt to be no paravalvular leak and no central aortic insufficiency.  The patient's hemodynamic recovery following valve deployment is good.  The deployment balloon and guidewire are both removed. Echo demostrated acceptable post-procedural gradients, stable mitral valve function, and no AI.   PROCEDURE COMPLETION:  The sheath was then removed and closure devices were completed. Protamine was administered once femoral arterial repair was complete. The temporary pacemaker, pigtail catheters and femoral sheaths were removed with manual pressure used for hemostasis.   The patient tolerated the procedure well and is transported to the surgical intensive care in stable condition. There were no immediate intraoperative complications. All sponge instrument and needle counts are verified correct at completion of the operation.   No blood products were administered during the operation.  The patient received a total of 60.8 mL of intravenous contrast during the procedure.  Lauree Chandler MD 05/15/2018 1:53 PM

## 2018-05-15 NOTE — Progress Notes (Addendum)
Site: left femoral  Sheath Size: 109fr - venous and arterial Condition prior to removal:  Level 0 Type of pressure held: manual Time pressure held: 20 min. Status of patient during pull: stable Condition of site post pull:  Level 0 / small area of firmness above sight - noted in OR during procedure Type of dressing applied: transparent Pulses verified: doppler bilat. DPs Patient's condition post pull: stable Bedrest begins at: 1420 Post instructions given to patient: y  Sheaths pulled by L. Twine, RT

## 2018-05-15 NOTE — Progress Notes (Signed)
Bedrest ended at 1915 and pt assisted to bathroom. Tolerated well. Assisted back to bed, will have patient walk in hallway later this PM

## 2018-05-15 NOTE — Progress Notes (Signed)
Recheck of right femoral groin indicates the small hematoma noted post pull of sheaths has grown.  Pressure being applied manually by Chip Ouida Sills, RN.

## 2018-05-16 ENCOUNTER — Encounter (HOSPITAL_COMMUNITY): Payer: Self-pay | Admitting: Cardiovascular Disease

## 2018-05-16 ENCOUNTER — Inpatient Hospital Stay (HOSPITAL_COMMUNITY): Payer: Medicare Other

## 2018-05-16 DIAGNOSIS — Z952 Presence of prosthetic heart valve: Secondary | ICD-10-CM

## 2018-05-16 DIAGNOSIS — I2510 Atherosclerotic heart disease of native coronary artery without angina pectoris: Secondary | ICD-10-CM

## 2018-05-16 DIAGNOSIS — I350 Nonrheumatic aortic (valve) stenosis: Secondary | ICD-10-CM

## 2018-05-16 LAB — BLOOD GAS, ARTERIAL
Acid-base deficit: 3.9 mmol/L — ABNORMAL HIGH (ref 0.0–2.0)
BICARBONATE: 20 mmol/L (ref 20.0–28.0)
Drawn by: 47059
FIO2: 0.21
O2 Saturation: 97.9 %
PATIENT TEMPERATURE: 98.6
PCO2 ART: 32.2 mmHg (ref 32.0–48.0)
PO2 ART: 103 mmHg (ref 83.0–108.0)
pH, Arterial: 7.409 (ref 7.350–7.450)

## 2018-05-16 LAB — CBC
HEMATOCRIT: 42.8 % (ref 39.0–52.0)
Hemoglobin: 14 g/dL (ref 13.0–17.0)
MCH: 26.7 pg (ref 26.0–34.0)
MCHC: 32.7 g/dL (ref 30.0–36.0)
MCV: 81.5 fL (ref 78.0–100.0)
Platelets: 165 10*3/uL (ref 150–400)
RBC: 5.25 MIL/uL (ref 4.22–5.81)
RDW: 14.3 % (ref 11.5–15.5)
WBC: 5.2 10*3/uL (ref 4.0–10.5)

## 2018-05-16 LAB — GLUCOSE, CAPILLARY: Glucose-Capillary: 218 mg/dL — ABNORMAL HIGH (ref 70–99)

## 2018-05-16 LAB — BASIC METABOLIC PANEL
Anion gap: 3 — ABNORMAL LOW (ref 5–15)
BUN: 15 mg/dL (ref 8–23)
CHLORIDE: 113 mmol/L — AB (ref 98–111)
CO2: 22 mmol/L (ref 22–32)
Calcium: 8.2 mg/dL — ABNORMAL LOW (ref 8.9–10.3)
Creatinine, Ser: 1.28 mg/dL — ABNORMAL HIGH (ref 0.61–1.24)
GFR calc Af Amer: >60 mL/min (ref >60)
GFR calc non Af Amer: 56 mL/min — ABNORMAL LOW (ref >60)
GLUCOSE: 112 mg/dL — AB (ref 70–99)
Potassium: 3.8 mmol/L (ref 3.5–5.1)
Sodium: 138 mmol/L (ref 135–145)

## 2018-05-16 LAB — ECHOCARDIOGRAM LIMITED
Height: 71 in
Weight: 2680 oz

## 2018-05-16 LAB — TSH: TSH: 3.157 u[IU]/mL (ref 0.350–4.500)

## 2018-05-16 LAB — MAGNESIUM: Magnesium: 1.9 mg/dL (ref 1.7–2.4)

## 2018-05-16 MED ORDER — CLOPIDOGREL BISULFATE 75 MG PO TABS: 75.0000 mg | ORAL_TABLET | Freq: Every day | ORAL | 1 refills | Status: DC

## 2018-05-16 MED ORDER — ASPIRIN 81 MG PO CHEW: 81.0000 mg | CHEWABLE_TABLET | Freq: Every day | ORAL | Status: DC

## 2018-05-16 MED ORDER — METFORMIN HCL 500 MG PO TABS: 1000.0000 mg | ORAL_TABLET | Freq: Two times a day (BID) | ORAL | Status: DC

## 2018-05-16 MED ORDER — METOPROLOL SUCCINATE ER 50 MG PO TB24
50.0000 mg | ORAL_TABLET | Freq: Every day | ORAL | Status: DC
  Administered 2018-05-16: 50 mg via ORAL
  Filled 2018-05-16: qty 1

## 2018-05-16 NOTE — Progress Notes (Signed)
  Echocardiogram 2D Echocardiogram has been performed.  Jennette Dubin 05/16/2018, 9:08 AM

## 2018-05-16 NOTE — Progress Notes (Signed)
Inpatient Diabetes Program Recommendations  AACE/ADA: New Consensus Statement on Inpatient Glycemic Control (2015)  Target Ranges:  Prepandial:   less than 140 mg/dL      Peak postprandial:   less than 180 mg/dL (1-2 hours)      Critically ill patients:  140 - 180 mg/dL   Lab Results  Component Value Date   GLUCAP 218 (H) 05/16/2018   HGBA1C 7.6 (H) 05/11/2018    Review of Glycemic Control  Diabetes history: Type 2 Outpatient Diabetes medications: Omnipod DASH insulin pump Current orders for Inpatient glycemic control: insulin pump order set  Inpatient Diabetes Program Recommendations:   Spoke with patient about his insulin pump and diabetes. Ws diagnosed about 12-13 years ago. Was started on the Omnipod DASH 1 month ago. Basal rates at 0.7 units/hr. CHO coverage is approximately 1 unit:11 grams CHO. Seems to be very good at working the insulin pump. States that his A1C was 8%, but Dr. Bubba Camp wants it down to 6%. Will continue to monitor blood sugars while in the hospital.  Harvel Ricks RN BSN CDE Diabetes Coordinator Pager: [phone]  8am-5pm

## 2018-05-16 NOTE — Discharge Instructions (Signed)
ACTIVITY AND EXERCISE  Daily activity and exercise are an important part of your recovery. People recover at different rates depending on their general health and type of valve procedure.  Most people recovering from TAVR feel better relatively quickly   No lifting, pushing, pulling more than 10 pounds (examples to avoid: groceries, vacuuming, gardening, golfing):             - For one week with a procedure through the groin.             - For six weeks for procedures through the chest wall or neck NOTE: You will typically see one of our providers 7-14 days after your procedure to discuss [place] the above activities.      DRIVING  Do not drive for until you are seen for follow up and cleared by a provider. Generally, we ask patient to not drive for 1 week after their procedure.  If you have been told by your doctor in the past that you may not drive, you must talk with him/her before you begin driving again.     DRESSING  Groin site: you may leave the clear dressing over the site for up to one week or until it falls off.     HYGIENE  If you had a femoral (leg) procedure, you may take a shower when you return home. After the shower, pat the site dry. Do NOT use powder, oils or lotions in your groin area until the site has completely healed.  If you had a chest procedure, you may shower when you return home unless specifically instructed not to by your discharging practitioner.             - DO NOT scrub incision; pat dry with a towel             - DO NOT apply any lotions, oils, powders to the incision             - No tub baths / swimming for at least 2 weeks.  If you notice any fevers, chills, increased pain, swelling, bleeding or pus, please contact your doctor.   ADDITIONAL INFORMATION  If you are going to have an upcoming dental procedure, please contact our office as you will require antibiotics ahead of time to prevent infection on your heart valve.    If you  have any questions or concerns you can call the structural heart phone during normal business hours 8am-4pm. If you have an urgent need after hours or weekends please call [phone] to talk to the on call provider for general cardiology. If you have an emergency that requires immediate attention, please call 911.    After TAVR Checklist  Check  Test Description   Follow up appointment in 1-2 weeks  Most of our patients will see our structural heart physician assistant, [name]. Your incision sites will be checked and you will be cleared to drive and resume all normal activities if you are doing well.     1 month echo and follow up  You will have an echo to check on your new heart valve and be seen back in the office by [name]. Many times the echo is not read by your appointment time, but [name] will call you later that day or the following day to report your results.   Follow up with your primary cardiologist You will need to be seen by your primary cardiologist in the following 3-6 months  after your 1 month appointment in the valve clinic. Often times your Plavix or Aspirin will be discontinued during this time, but this is decided on a case by case basis.    1 year echo and follow up You will have another echo to check on your heart valve after 1 year and be seen back in the office by [name]. This your last structural heart visit.   Bacterial endocarditis prophylaxis  You will have to take antibiotics for the rest of your life before all dental procedures (even teeth cleanings) to protect your heart valve. Antibiotics are also required before some surgeries. Please check with your cardiologist before scheduling any surgeries. Also, please make sure to tell us if you have a penicillin allergy as you will require an alternative antibiotic.

## 2018-05-16 NOTE — Discharge Summary (Addendum)
Discharge Summary    Patient ID: [name] MRN: [id number]; DOB: 1951/01/08  Admit date: 05/15/2018 Discharge date: 05/16/2018  Primary Care Provider: [name], MD  Primary Cardiologist: Fransico Him, MD    Discharge Diagnoses    Principal Problem:   S/P TAVR (transcatheter aortic valve replacement) Active Problems:   DM (diabetes mellitus) ([facility])   Dyslipidemia   Hypertension   CAD (coronary artery disease), native coronary artery   Aortic stenosis, severe   Acute on chronic combined systolic and diastolic CHF (congestive heart failure) ([facility])   History of CVA (cerebrovascular accident)   Hx of radiation therapy to mediastinum   Malignant seminoma of mediastinum (HCC)   Allergies Allergies  Allergen Reactions  . Lipitor [Atorvastatin] Other (See Comments)    Muscle pain  . Adhesive [Tape] Rash    Diagnostic Studies/Procedures    TAVR OPERATIVE NOTE   Date of Procedure:                05/15/2018  Preoperative Diagnosis:      Severe Aortic Stenosis    Procedure:        Transcatheter Aortic Valve Replacement - Percutaneous Right Transfemoral Approach             Edwards Sapien 3 THV (size 29 mm, model # 9600TFX, serial # [id number])              Co-Surgeons:                        Lauree Chandler, MD and Valentina Gu. Roxy Manns, MD  Pre-operative Echo Findings: ? Severe aortic stenosis ? Moderate-severe left ventricular systolic dysfunction  Post-operative Echo Findings: ? No paravalvular leak ? Unchanged left ventricular systolic function  _____________   Echo 05/16/18 Study Conclusions - Left ventricle: The cavity size was normal. Wall thickness was   increased in a pattern of mild LVH. Septal-lateral dyssynchrony   consistent with LBBB. Anteroseptal severe hypokinesis and   inferior severe hypokinesis. The estimated ejection fraction was   35%. Diastolic function was not assessed. - Aortic valve: Bioprosthetic aortic valve s/p TAVR. No  bioprosthetic valve stenosis. There was no regurgitation. Mean   gradient (S): 7 mm Hg. Valve area (VTI): 2.08 cm^2. - Mitral valve: Mildly calcified annulus. There was trivial   regurgitation. - Right ventricle: The cavity size was normal. Systolic function   was mildly reduced. - Tricuspid valve: Peak RV-RA gradient (S): 24 mm Hg. - Pulmonary arteries: PA peak pressure: 32 mm Hg (S). - Systemic veins: IVC measured 2.4 cm with normal respirophasic   variation, suggesting RA pressure 8 mmHg. Impressions: - Normal LV size with mild LV hypertrophy. EF 35% with anteroseptal   and inferior severe hypokinesis. Septal-lateral dyssynchrony   consistent with LBBB. Normal RV size with mildly decreased   systolic function. Bioprosthetic aortic valve s/p TAVR. The valve   appears well-seated with no stenosis or regurgitation.  History of Present Illness     [name] is a 67 y.o. male with a history of mediastinal seminoma treated with surgical resection followed by radiation therapy, previous CVA, CAD s/p previous stenting and ultimately CABG x2 in 2014, HTN, HLD, DMT2, CKD stage II, chronic combined S/D CHF and low flow, low gradient AS with AI who presented to [facility] on 05/15/18 for planned TAVR.  Patient did well following coronary artery bypass surgery and has been followed closely ever since by Dr. Radford Pax. Echocardiograms have documented the presence of  aortic stenosis with moderate to severe left ventricular systolic dysfunction with ejection fraction estimated 30 to 35%. Transvalvular gradients across the aortic valve have suggested the presence of at least moderate aortic stenosis with mean transvalvular gradient estimated 20 mmHg, DVI reported 0.22 and aortic valve area calculated 1.02 cm. Over the past few months the patient developed fairly rapid progression of symptoms of exertional shortness of breath, weight gain, orthopnea, PND, and lower extremity edema. He was hospitalized in August  with acute exacerbation of congestive heart failure. He responded to IV diuresis.He was evaluated in consultation by Dr. Angelena Form and underwent diagnostic cardiac catheterization by Dr. Haroldine Laws on June 13, 2018. Catheterization revealed moderate nonobstructive disease in the left anterior descending coronary artery with continued patency of the vein grafts to the left circumflex and right coronary territories.The patient stabilized medically and was discharged home.  He was evaluated by the multidisciplinary valve team and felt to be a suitable candidate for TAVR which was set up for 05/15/2018.   Hospital Course     Consultants: none   Severe low flow, low gradient AS: s/p successful TAVR with a 29 mm Edwards Sapien 3 THV via the TF approach on 05/15/18. Post operative echo showed EF 35%, normally functioning TAVR with mean gradient 7 mm Hg and no PVL. Groin sites are stable. ECG with new IVCD but no high grade heart block. Plan for discharge home today with close follow up in the office.   New IVCD: HR elevated this AM, ~110 bpm. Will resume home Toprol XL. post op echo showed septal-lateral dyssynchrony consistent with LBBB. EF 35%. If this persists he may be a candidate for CRT therapy in the future.  Acute on chronic combined S/D CHF: as evidenced by elevated BNP ~700 on pre admission labs. This has been treated with TAVR. He has not been on a diuretic at home but has been very strict with salt and fluid restriction.   HTN: BP well controlled.    CAD s/p CABG: pre TAVR cath showed patent bypass grafts. Continue medical therapy.  Hx of CVA: he had an episode of blurry vision and lightheadedness after walking yesterday that lasted a minute or so. No weakness or aphasia. This has resolved with no recurrence. I have asked him to let us know if this happens again.  DMT2: he used his home insulin pump while admitted. Resume home regimen at discharge. He can resume Metformin tomorrow    _____________  Discharge Vitals Blood pressure 124/81, pulse (!) 110, temperature 97.8 F (36.6 C), temperature source Oral, resp. rate 20, height 5\' 11"  (1.803 m), weight 76 kg, SpO2 99 %.  Filed Weights   05/15/18 1038 05/16/18 0439  Weight: 77.2 kg 76 kg    Labs & Radiologic Studies    CBC Recent Labs    05/15/18 1359 05/16/18 0444  WBC  --  5.2  HGB 14.3 14.0  HCT 42.0 42.8  MCV  --  81.5  PLT  --  956   Basic Metabolic Panel Recent Labs    05/15/18 1359 05/16/18 0444  NA 143 138  K 4.5 3.8  CL 111 113*  CO2  --  22  GLUCOSE 142* 112*  BUN 18 15  CREATININE 1.10 1.28*  CALCIUM  --  8.2*  MG  --  1.9   Liver Function Tests No results for input(s): AST, ALT, ALKPHOS, BILITOT, PROT, ALBUMIN in the last 72 hours. No results for input(s): LIPASE, AMYLASE in the last 72  hours. Cardiac Enzymes No results for input(s): CKTOTAL, CKMB, CKMBINDEX, TROPONINI in the last 72 hours. BNP Invalid input(s): POCBNP D-Dimer No results for input(s): DDIMER in the last 72 hours. Hemoglobin A1C No results for input(s): HGBA1C in the last 72 hours. Fasting Lipid Panel No results for input(s): CHOL, HDL, LDLCALC, TRIG, CHOLHDL, LDLDIRECT in the last 72 hours. Thyroid Function Tests Recent Labs    05/16/18 0444  TSH 3.157   _____________  Dg Chest 2 View  Result Date: 05/11/2018 CLINICAL DATA:  Severe aortic stenosis. Pre valve replacement evaluation. EXAM: CHEST - 2 VIEW COMPARISON:  04/11/2018. Chest CT dated 04/26/2018. FINDINGS: The cardiac silhouette remains borderline enlarged. Stable post CABG changes. Clear lungs without the previously seen 5 mm nodular density overlying the right upper lung zone. Dense coronary artery calcifications. Unremarkable bones. IMPRESSION: No acute abnormality. Electronically Signed   By: Claudie Revering M.D.   On: 05/11/2018 17:25   Ct Coronary Morph W/cta Cor W/score W/ca W/cm &/or Wo/cm  Addendum Date: 04/26/2018   ADDENDUM REPORT:  04/26/2018 20:04 CLINICAL DATA:  67 year old male with DCM< CAD and severe aortic stenosis being evaluated for a TAVR procedure. EXAM: Cardiac TAVR CT TECHNIQUE: The patient was scanned on a [employer]. A 120 kV retrospective scan was triggered in the descending thoracic aorta at 111 HU's. Gantry rotation speed was 250 msecs and collimation was .6 mm. No beta blockade or nitro were given. The 3D data set was reconstructed in 5% intervals of the R-R cycle. Systolic and diastolic phases were analyzed on a dedicated work station using MPR, MIP and VRT modes. The patient received 80 cc of contrast. FINDINGS: Aortic Valve: Trileaflet aortic valve with severely thickened leaflets, moderate calcifications and mild calcifications extending into the LVOT under the left and non-coronary cusps. Aorta: Normal size with mild diffuse calcifications and no dissection. Sinotubular Junction: 33 x 29 mm Ascending Thoracic Aorta:  36 x 33 mm Aortic Arch: 27 x 25 mm Descending Thoracic Aorta: 25 x 24 mm Sinus of Valsalva Measurements: Non-coronary: 35 mm Right -coronary: 37 mm Left -coronary: 37 mm Coronary Artery Height above Annulus: Left Main: 14 mm Right Coronary: 18 mm Virtual Basal Annulus Measurements: Maximum/Minimum Diameter: 31.8 x 24.1 mm Mean Diameter: 27.5 mm Perimeter: 90.2 mm Area: 595 mm2 Optimum Fluoroscopic Angle for Delivery: LAO 6 CAU 7 IMPRESSION: 1. Trileaflet aortic valve with severely thickened leaflets, moderate calcifications and mild calcifications extending into the LVOT under the left and non-coronary cusps. Annular measurements suitable for delivery of a 29 mm Edwards-SAPIEN 3 valve. 2. Sufficient coronary to annulus distance. 3. Optimum Fluoroscopic Angle for Delivery:  LAO 6 CAU 7 4. No thrombus in the left atrial appendage. Electronically Signed   By: Ena Dawley   On: 04/26/2018 20:04   Result Date: 04/26/2018 EXAM: OVER-READ INTERPRETATION  CT CHEST The following report is an  over-read performed by radiologist Dr. Vinnie Langton of [facility], [place] on 04/26/2018. This over-read does not include interpretation of cardiac or coronary anatomy or pathology. The coronary CTA interpretation by the cardiologist is attached. COMPARISON:  Chest CT 12/31/2012. FINDINGS: Extracardiac findings will be described separately under dictation for contemporaneously obtained CTA chest, abdomen and pelvis. IMPRESSION: Please see separate dictation for contemporaneously obtained CTA chest, abdomen and pelvis dated 04/26/2018 for full description of relevant extracardiac findings. Electronically Signed: By: Vinnie Langton M.D. On: 04/26/2018 11:41   Dg Chest Port 1 View  Result Date: 05/15/2018 CLINICAL DATA:  Post TAVR today,  history of severe aortic stenosis and CABG in 2014 EXAM: PORTABLE CHEST 1 VIEW COMPARISON:  Chest x-ray of 05/11/2018 FINDINGS: The lungs are moderately well aerated. No pneumonia or effusion is seen. Right IJ central venous line tip overlies the upper SVC. Cardiomegaly is stable. Aortic valve replacement prosthesis is now present. Median sternotomy sutures are noted. IMPRESSION: 1. No active lung disease. 2. Aortic valve prosthesis now present. Electronically Signed   By: Ivar Drape M.D.   On: 05/15/2018 16:14   Ct Angio Chest Aorta W &/or Wo Contrast  Result Date: 04/26/2018 CLINICAL DATA:  67 year old male with history of severe aortic stenosis. Preprocedural study prior to potential transcatheter aortic valve replacement (TAVR) procedure. EXAM: CT ANGIOGRAPHY CHEST, ABDOMEN AND PELVIS TECHNIQUE: Multidetector CT imaging through the chest, abdomen and pelvis was performed using the standard protocol during bolus administration of intravenous contrast. Multiplanar reconstructed images and MIPs were obtained and reviewed to evaluate the vascular anatomy. CONTRAST:  117mL ISOVUE-370 IOPAMIDOL (ISOVUE-370) INJECTION 76% COMPARISON:  Chest CT 12/31/2012. CT the abdomen  and pelvis 07/04/2010. FINDINGS: CTA CHEST FINDINGS Cardiovascular: Heart size is mildly enlarged. There is no significant pericardial fluid, thickening or pericardial calcification. There is aortic atherosclerosis, as well as atherosclerosis of the great vessels of the mediastinum and the coronary arteries, including calcified atherosclerotic plaque in the left main, left anterior descending, left circumflex and right coronary arteries. Status post median sternotomy for CABG. Severe thickening calcification of the aortic valve. Calcifications of the anterior leaflet of the mitral valve. Mediastinum/Lymph Nodes: No pathologically enlarged mediastinal or hilar lymph nodes. Esophagus is unremarkable in appearance. No axillary lymphadenopathy. Lungs/Pleura: Small bilateral pleural effusions (left greater than right) lying dependently. 5 mm subpleural nodule in the periphery of the right lower lobe abutting the minor fissure (axial image 48 of series 7), unchanged dating back to 2014, considered definitively benign (presumably a subpleural lymph noted). No other suspicious appearing pulmonary nodules or masses are noted. Some mild dependent subsegmental atelectasis is noted in the lower lobes of the lungs bilaterally. Small focus of nodular appearing probable airspace consolidation in the superior segment of the left lower lobe, concerning for pneumonia (axial image 46 of series 7). Musculoskeletal/Soft Tissues: Median sternotomy wires. There are no aggressive appearing lytic or blastic lesions noted in the visualized portions of the skeleton. CTA ABDOMEN AND PELVIS FINDINGS Hepatobiliary: 9 mm hypervascular lesion in segment 8 of the liver (axial image 90 of series 6). No other definite cystic or solid hepatic lesions. No intra or extrahepatic biliary ductal dilatation. Gallbladder is normal in appearance. Pancreas: No pancreatic mass. No pancreatic ductal dilatation. No pancreatic or peripancreatic fluid or  inflammatory changes. Spleen: Unremarkable. Adrenals/Urinary Tract: 8 mm nonobstructive calculus in the lower pole collecting system of the right kidney. A few tiny subcentimeter low-attenuation lesions in the kidneys bilaterally are too small to definitively characterize, but are statistically favored to represent tiny cysts. No suspicious renal lesions. No hydroureteronephrosis. Bilateral adrenal glands are normal in appearance. Urinary bladder is normal in appearance. Stomach/Bowel: Normal appearance of the stomach. No pathologic dilatation of small bowel or colon. Very large volume of stool throughout the colon, suggesting constipation. Normal appendix. Vascular/Lymphatic: Aortic atherosclerosis with vascular findings and measurements pertinent to potential TAVR procedure, as detailed below. No aneurysm or dissection noted in the abdominal or pelvic vasculature. No lymphadenopathy noted in the abdomen or pelvis. Reproductive: Prostate gland and seminal vesicles are unremarkable in appearance. Other: No significant volume of ascites.  No pneumoperitoneum. Musculoskeletal: There  are no aggressive appearing lytic or blastic lesions noted in the visualized portions of the skeleton. VASCULAR MEASUREMENTS PERTINENT TO TAVR: AORTA: Minimal Aortic Diameter-16 x 17 mm Severity of Aortic Calcification-mild-to-moderate RIGHT PELVIS: Right Common Iliac Artery - Minimal Diameter-11.6 x 9.9 mm Tortuosity-mild Calcification-moderate Right External Iliac Artery - Minimal Diameter-7.8 x 8.1 mm Tortuosity - mild Calcification - mild Right Common Femoral Artery - Minimal Diameter-8.8 x 8.5 mm Tortuosity - mild Calcification - mild LEFT PELVIS: Left Common Iliac Artery - Minimal Diameter-10.6 x 10.2 mm Tortuosity - mild Calcification-mild Left External Iliac Artery - Minimal Diameter-7.3 x 8.7 mm Tortuosity - mild Calcification - mild Left Common Femoral Artery - Minimal Diameter-8.1 x 8.8 mm Tortuosity - mild Calcification - mild  Review of the MIP images confirms the above findings. IMPRESSION: 1. Vascular findings and measurements pertinent to potential TAVR procedure, as detailed above. 2. Severe thickening calcification of the aortic valve, compatible with the reported clinical history of severe aortic stenosis. Nodular focus of what appears to be airspace consolidation in the superior segment of the left lower lobe, concerning for a small focus of pneumonia. The possibility of a true pulmonary nodule in this region is not entirely excluded, but is not favored. Further clinical evaluation is recommended, as well as follow-up noncontrast chest CT in the 2-3 months to ensure resolution of this finding. 3. Aortic atherosclerosis, in addition to left main and 3 vessel coronary artery disease. Status post median sternotomy for CABG. 4. Small bilateral pleural effusions (left greater than right) lying dependently. 5. Mild cardiomegaly. 6. Small hypervascular lesion measuring 9 mm in segment 8 of the liver. This is favored to be a benign perfusion anomaly or potentially a flash fill cavernous hemangioma. This could be definitively characterized with nonemergent MRI of the abdomen with and without IV gadolinium if of clinical concern. 7. There nonobstructive calculus in the lower pole collecting system of the right kidney. 8. Large volume of stool throughout the colon suggesting constipation. Aortic Atherosclerosis (ICD10-I70.0). Electronically Signed   By: Vinnie Langton M.D.   On: 04/26/2018 13:59   Ct Angio Abd/pel W/ And/or W/o  Result Date: 04/26/2018 CLINICAL DATA:  67 year old male with history of severe aortic stenosis. Preprocedural study prior to potential transcatheter aortic valve replacement (TAVR) procedure. EXAM: CT ANGIOGRAPHY CHEST, ABDOMEN AND PELVIS TECHNIQUE: Multidetector CT imaging through the chest, abdomen and pelvis was performed using the standard protocol during bolus administration of intravenous contrast.  Multiplanar reconstructed images and MIPs were obtained and reviewed to evaluate the vascular anatomy. CONTRAST:  132mL ISOVUE-370 IOPAMIDOL (ISOVUE-370) INJECTION 76% COMPARISON:  Chest CT 12/31/2012. CT the abdomen and pelvis 07/04/2010. FINDINGS: CTA CHEST FINDINGS Cardiovascular: Heart size is mildly enlarged. There is no significant pericardial fluid, thickening or pericardial calcification. There is aortic atherosclerosis, as well as atherosclerosis of the great vessels of the mediastinum and the coronary arteries, including calcified atherosclerotic plaque in the left main, left anterior descending, left circumflex and right coronary arteries. Status post median sternotomy for CABG. Severe thickening calcification of the aortic valve. Calcifications of the anterior leaflet of the mitral valve. Mediastinum/Lymph Nodes: No pathologically enlarged mediastinal or hilar lymph nodes. Esophagus is unremarkable in appearance. No axillary lymphadenopathy. Lungs/Pleura: Small bilateral pleural effusions (left greater than right) lying dependently. 5 mm subpleural nodule in the periphery of the right lower lobe abutting the minor fissure (axial image 48 of series 7), unchanged dating back to 2014, considered definitively benign (presumably a subpleural lymph noted). No other suspicious appearing  pulmonary nodules or masses are noted. Some mild dependent subsegmental atelectasis is noted in the lower lobes of the lungs bilaterally. Small focus of nodular appearing probable airspace consolidation in the superior segment of the left lower lobe, concerning for pneumonia (axial image 46 of series 7). Musculoskeletal/Soft Tissues: Median sternotomy wires. There are no aggressive appearing lytic or blastic lesions noted in the visualized portions of the skeleton. CTA ABDOMEN AND PELVIS FINDINGS Hepatobiliary: 9 mm hypervascular lesion in segment 8 of the liver (axial image 90 of series 6). No other definite cystic or solid  hepatic lesions. No intra or extrahepatic biliary ductal dilatation. Gallbladder is normal in appearance. Pancreas: No pancreatic mass. No pancreatic ductal dilatation. No pancreatic or peripancreatic fluid or inflammatory changes. Spleen: Unremarkable. Adrenals/Urinary Tract: 8 mm nonobstructive calculus in the lower pole collecting system of the right kidney. A few tiny subcentimeter low-attenuation lesions in the kidneys bilaterally are too small to definitively characterize, but are statistically favored to represent tiny cysts. No suspicious renal lesions. No hydroureteronephrosis. Bilateral adrenal glands are normal in appearance. Urinary bladder is normal in appearance. Stomach/Bowel: Normal appearance of the stomach. No pathologic dilatation of small bowel or colon. Very large volume of stool throughout the colon, suggesting constipation. Normal appendix. Vascular/Lymphatic: Aortic atherosclerosis with vascular findings and measurements pertinent to potential TAVR procedure, as detailed below. No aneurysm or dissection noted in the abdominal or pelvic vasculature. No lymphadenopathy noted in the abdomen or pelvis. Reproductive: Prostate gland and seminal vesicles are unremarkable in appearance. Other: No significant volume of ascites.  No pneumoperitoneum. Musculoskeletal: There are no aggressive appearing lytic or blastic lesions noted in the visualized portions of the skeleton. VASCULAR MEASUREMENTS PERTINENT TO TAVR: AORTA: Minimal Aortic Diameter-16 x 17 mm Severity of Aortic Calcification-mild-to-moderate RIGHT PELVIS: Right Common Iliac Artery - Minimal Diameter-11.6 x 9.9 mm Tortuosity-mild Calcification-moderate Right External Iliac Artery - Minimal Diameter-7.8 x 8.1 mm Tortuosity - mild Calcification - mild Right Common Femoral Artery - Minimal Diameter-8.8 x 8.5 mm Tortuosity - mild Calcification - mild LEFT PELVIS: Left Common Iliac Artery - Minimal Diameter-10.6 x 10.2 mm Tortuosity - mild  Calcification-mild Left External Iliac Artery - Minimal Diameter-7.3 x 8.7 mm Tortuosity - mild Calcification - mild Left Common Femoral Artery - Minimal Diameter-8.1 x 8.8 mm Tortuosity - mild Calcification - mild Review of the MIP images confirms the above findings. IMPRESSION: 1. Vascular findings and measurements pertinent to potential TAVR procedure, as detailed above. 2. Severe thickening calcification of the aortic valve, compatible with the reported clinical history of severe aortic stenosis. Nodular focus of what appears to be airspace consolidation in the superior segment of the left lower lobe, concerning for a small focus of pneumonia. The possibility of a true pulmonary nodule in this region is not entirely excluded, but is not favored. Further clinical evaluation is recommended, as well as follow-up noncontrast chest CT in the 2-3 months to ensure resolution of this finding. 3. Aortic atherosclerosis, in addition to left main and 3 vessel coronary artery disease. Status post median sternotomy for CABG. 4. Small bilateral pleural effusions (left greater than right) lying dependently. 5. Mild cardiomegaly. 6. Small hypervascular lesion measuring 9 mm in segment 8 of the liver. This is favored to be a benign perfusion anomaly or potentially a flash fill cavernous hemangioma. This could be definitively characterized with nonemergent MRI of the abdomen with and without IV gadolinium if of clinical concern. 7. There nonobstructive calculus in the lower pole collecting system of the right  kidney. 8. Large volume of stool throughout the colon suggesting constipation. Aortic Atherosclerosis (ICD10-I70.0). Electronically Signed   By: Vinnie Langton M.D.   On: 04/26/2018 13:59   Disposition   Pt is being discharged home today in good condition.  Follow-up Plans & Appointments    Follow-up Information    Eileen Stanford, PA-C. Go on 05/28/2018.   Specialties:  Cardiology, Radiology Why:  @  2:30pm, please arrive at least 10 minutes early Contact information: [place] [address] [phone]            Discharge Medications   Allergies as of 05/16/2018      Reactions   Lipitor [atorvastatin] Other (See Comments)   Muscle pain   Adhesive [tape] Rash      Medication List    TAKE these medications   amitriptyline 100 MG tablet Commonly known as:  ELAVIL Take 100 mg by mouth daily.   aspirin 81 MG chewable tablet Chew 1 tablet (81 mg total) by mouth daily. Start taking on:  05/17/2018   B-D ULTRAFINE III SHORT PEN 31G X 8 MM Misc Generic drug:  Insulin Pen Needle 1 each by Other route daily as needed (GLUCOSE TESTING (INSULINE NEEDLE)).   clopidogrel 75 MG tablet Commonly known as:  PLAVIX Take 1 tablet (75 mg total) by mouth daily with breakfast. Start taking on:  05/17/2018   HUMALOG KWIKPEN 100 UNIT/ML KiwkPen Generic drug:  insulin lispro Per pump;  0.8 unit(s)/per hour average and patient gives a bolus, if needed   hydroxypropyl methylcellulose / hypromellose 2.5 % ophthalmic solution Commonly known as:  ISOPTO TEARS / GONIOVISC Place 1 drop into both eyes 4 (four) times daily as needed for dry eyes.   levothyroxine 50 MCG tablet Commonly known as:  SYNTHROID, LEVOTHROID Take 50 mcg by mouth daily before breakfast.   metFORMIN 500 MG tablet Commonly known as:  GLUCOPHAGE Take 2 tablets (1,000 mg total) by mouth 2 (two) times daily with a meal. Start taking on:  05/17/2018   metoprolol succinate 50 MG 24 hr tablet Commonly known as:  TOPROL-XL Take 1 tablet (50 mg total) by mouth daily. Take with or immediately following a meal.   multivitamin tablet Take 1 tablet by mouth daily.   ONE TOUCH ULTRA TEST test strip Generic drug:  glucose blood 1 each by Other route as needed for other.   simvastatin 40 MG tablet Commonly known as:  ZOCOR Take 40 mg by mouth every evening.   SUPER B COMPLEX PO Take 1  tablet by mouth daily.   Vitamin D-3 5000 units Tabs Take 5,000 Units by mouth daily.   zolpidem 10 MG tablet Commonly known as:  AMBIEN Take 10 mg by mouth daily.           Outstanding Labs/Studies   none  Duration of Discharge Encounter   Greater than 30 minutes including physician time.  Signed, Angelena Form, PA-C 05/16/2018, 10:54 AM  I have personally seen and examined this patient. I agree with the assessment and plan as outlined above.  He is doing well this am one day post TAVR. New IVCD but he is in sinus and no high grade AV block. Resume home dose of beta blocker given tachycardia. Echo this am shows normally functioning bioprosthetic AVR with no PVL. Will discharge home today on ASA and Plavix.   Lauree Chandler 05/16/2018 10:59 AM

## 2018-05-16 NOTE — Progress Notes (Signed)
Pt given AVS handout; verbalized understanding. IV removed;VSS at discharge. Pt planning to go home with wife.

## 2018-05-16 NOTE — Anesthesia Postprocedure Evaluation (Signed)
Anesthesia Post Note  Patient: [name]  Procedure(s) Performed: TRANSCATHETER AORTIC VALVE REPLACEMENT, TRANSFEMORAL (N/A Chest) INTRAOPERATIVE TRANSTHORACIC ECHOCARDIOGRAM (N/A )     Patient location during evaluation: ICU Anesthesia Type: MAC Level of consciousness: awake and alert Pain management: pain level controlled Vital Signs Assessment: post-procedure vital signs reviewed and stable Respiratory status: spontaneous breathing, nonlabored ventilation, respiratory function stable and patient connected to nasal cannula oxygen Cardiovascular status: stable and blood pressure returned to baseline Postop Assessment: no apparent nausea or vomiting Anesthetic complications: no    Last Vitals:  Vitals:   05/16/18 0439 05/16/18 0734  BP: 123/83 124/81  Pulse: (!) 113 (!) 110  Resp: 20 20  Temp: 36.9 C 36.6 C  SpO2: 100% 99%    Last Pain:  Vitals:   05/16/18 0734  TempSrc: Oral  PainSc: 0-No pain                 [name] S

## 2018-05-16 NOTE — Progress Notes (Signed)
CL removed per order. Pressure held; transparent dressing applied. Will continue to monitor.  Lilla Shook ,BSN

## 2018-05-16 NOTE — Progress Notes (Signed)
CARDIAC REHAB PHASE I   PRE:  Rate/Rhythm: 110 ST  BP:  Sitting: 124/87      SaO2: 100 RA  MODE:  Ambulation: 350 ft 114 peak HR  POST:  Rate/Rhythm: 109 ST  BP:  Sitting: 121/77    SaO2: 100 RA   Pt ambulated 368ft in hallway independently with steady gait. Pt states his breathing is much better. Pt given HF booklet and reviewed importance of daily weights with pt. Reviewed restrictions and exercise guidelines. Pt eager to return to Cardiac Rehab Maintenance at [facility]. Pt hopeful for d/c today.  [id number] Rufina Falco, RN BSN 05/16/2018 9:47 AM

## 2018-05-17 ENCOUNTER — Telehealth: Payer: Self-pay

## 2018-05-17 ENCOUNTER — Encounter (HOSPITAL_COMMUNITY): Payer: Self-pay

## 2018-05-17 NOTE — Telephone Encounter (Signed)
Patient contacted regarding discharge from [facility] on 05/16/2018.  Patient understands to follow up with provider Nell Range PA-C on 05/28/2018 at 2:30 PM at [facility]. Patient understands discharge instructions? yes Patient understands medications and regiment? yes Patient understands to bring all medications to this visit? yes   I spoke with the pt and he is feeling well at this time. The pt denied any questions or concerns.

## 2018-05-18 ENCOUNTER — Encounter: Payer: Self-pay | Admitting: Thoracic Surgery (Cardiothoracic Vascular Surgery)

## 2018-05-18 ENCOUNTER — Encounter (HOSPITAL_COMMUNITY): Payer: Self-pay

## 2018-05-21 ENCOUNTER — Telehealth: Payer: Self-pay | Admitting: *Deleted

## 2018-05-21 ENCOUNTER — Telehealth: Payer: Self-pay | Admitting: Cardiovascular Disease

## 2018-05-21 DIAGNOSIS — G4730 Sleep apnea, unspecified: Secondary | ICD-10-CM

## 2018-05-21 MED FILL — Phenylephrine HCl IV Soln 10 MG/ML: INTRAVENOUS | Qty: 2 | Status: AC

## 2018-05-21 MED FILL — Potassium Chloride Inj 2 mEq/ML: INTRAVENOUS | Qty: 40 | Status: AC

## 2018-05-21 MED FILL — Magnesium Sulfate Inj 50%: INTRAMUSCULAR | Qty: 10 | Status: AC

## 2018-05-21 MED FILL — Sodium Chloride IV Soln 0.9%: INTRAVENOUS | Qty: 250 | Status: AC

## 2018-05-21 MED FILL — Heparin Sodium (Porcine) Inj 1000 Unit/ML: INTRAMUSCULAR | Qty: 30 | Status: AC

## 2018-05-21 NOTE — Telephone Encounter (Signed)
Staff message sent to [facility] sleep study does not need PA. Ok to schedule.

## 2018-05-21 NOTE — Telephone Encounter (Signed)
Patient is scheduled for lab study on 05/26/18. Patient/wife per dpr understands his sleep study will be done at [facility] sleep lab. Patient/wife understands he will receive a sleep packet in a week or so. Patient understands/wife to call if he does not receive the sleep packet in a timely manner. Patient agrees with treatment and thanked me for call.

## 2018-05-21 NOTE — Telephone Encounter (Signed)
I doubt this is related to his procedure. If his heart rate and blood pressure are stable, I would assume that his abnormal respiratory patterns sleep apnea related. I would defer to Dr. Radford Pax his primary cardiologist and our sleep apnea specialist in regards to further testing (sleep study).   [name]

## 2018-05-21 NOTE — Telephone Encounter (Signed)
New Message:     Pt is having problem at night with breathing. He breathes real fast and then he stops. She wonder if he might needs a Sleep Study or oxygen at night to help this problem?

## 2018-05-21 NOTE — Telephone Encounter (Signed)
I spoke with pt's wife. She reports since Friday she has noticed pt has episodes at night where he stops breathing. She thinks this happens continually throughout the night.  She states pt will stop breathing for 10-12 seconds and then breathe fast for 12-13 breaths. Will also breathe fast and then stop for 10-12 seconds.  She states this happens in a pattern throughout the night.  Does not know if he had this prior to surgery. She woke him up on Saturday night and he felt fine. Has never used oxygen before at home. Otherwise he is feeling fine. Feels better than he has in a long time. No swelling. Weight fluctuates by a pound. Pulse 90-104.  Wife states prior to hospitalization heart rate was around 108. I told pt's wife I would send message to [place] and call her back with his recommendations.  Pt is seeing K. Grandville Silos, [place] on 10/14.

## 2018-05-21 NOTE — Telephone Encounter (Signed)
-----   Message from Thompson Grayer, RN sent at 05/21/2018  1:31 PM EDT ----- Regarding: sleep study Pt needs  a split-night sleep study that needs to be done this week ASAP. Per Dr. Radford Pax.  Thanks

## 2018-05-21 NOTE — Telephone Encounter (Signed)
Pt's wife notified. Will forward to [name] to arrange study

## 2018-05-21 NOTE — Telephone Encounter (Signed)
He could be having Cheyne-Stokes breathing.  Please order a split-night sleep study that needs to be done this week ASAP.

## 2018-05-21 NOTE — Telephone Encounter (Signed)
-----   Message from Lauralee Evener, [place] sent at 05/21/2018  2:59 PM EDT ----- Regarding: RE: sleep study Patient does not need PA ok to schedule. ----- Message ----- From: Thompson Grayer, RN Sent: 05/21/2018   1:31 PM EDT To: Freada Bergeron, CMA, [facility] Sleep Studies Subject: sleep study                                    Pt needs  a split-night sleep study that needs to be done this week ASAP. Per Dr. Radford Pax.  Thanks

## 2018-05-22 ENCOUNTER — Encounter (HOSPITAL_COMMUNITY): Payer: Self-pay

## 2018-05-23 NOTE — Telephone Encounter (Signed)
Patient has an appointment scheduled for Saturday  05/26/18.

## 2018-05-24 ENCOUNTER — Encounter (HOSPITAL_COMMUNITY): Payer: Self-pay

## 2018-05-25 ENCOUNTER — Encounter (HOSPITAL_COMMUNITY): Payer: Self-pay

## 2018-05-26 ENCOUNTER — Ambulatory Visit (HOSPITAL_BASED_OUTPATIENT_CLINIC_OR_DEPARTMENT_OTHER): Payer: Medicare Other | Attending: Cardiology | Admitting: Cardiology

## 2018-05-26 DIAGNOSIS — G4731 Primary central sleep apnea: Secondary | ICD-10-CM | POA: Insufficient documentation

## 2018-05-26 DIAGNOSIS — G4733 Obstructive sleep apnea (adult) (pediatric): Secondary | ICD-10-CM | POA: Insufficient documentation

## 2018-05-26 DIAGNOSIS — G4730 Sleep apnea, unspecified: Secondary | ICD-10-CM | POA: Diagnosis not present

## 2018-05-28 ENCOUNTER — Encounter: Payer: Self-pay | Admitting: *Deleted

## 2018-05-28 ENCOUNTER — Encounter: Payer: Self-pay | Admitting: Physician Assistant

## 2018-05-28 ENCOUNTER — Ambulatory Visit (INDEPENDENT_AMBULATORY_CARE_PROVIDER_SITE_OTHER): Payer: Medicare Other | Admitting: Physician Assistant

## 2018-05-28 VITALS — BP 118/82 | HR 94 | Ht 71.0 in | Wt 165.4 lb

## 2018-05-28 DIAGNOSIS — R911 Solitary pulmonary nodule: Secondary | ICD-10-CM

## 2018-05-28 DIAGNOSIS — I5042 Chronic combined systolic (congestive) and diastolic (congestive) heart failure: Secondary | ICD-10-CM

## 2018-05-28 DIAGNOSIS — Z8673 Personal history of transient ischemic attack (TIA), and cerebral infarction without residual deficits: Secondary | ICD-10-CM

## 2018-05-28 DIAGNOSIS — R0600 Dyspnea, unspecified: Secondary | ICD-10-CM | POA: Diagnosis not present

## 2018-05-28 DIAGNOSIS — R918 Other nonspecific abnormal finding of lung field: Secondary | ICD-10-CM | POA: Diagnosis not present

## 2018-05-28 DIAGNOSIS — Z952 Presence of prosthetic heart valve: Secondary | ICD-10-CM | POA: Diagnosis not present

## 2018-05-28 DIAGNOSIS — Z951 Presence of aortocoronary bypass graft: Secondary | ICD-10-CM

## 2018-05-28 DIAGNOSIS — I10 Essential (primary) hypertension: Secondary | ICD-10-CM | POA: Diagnosis not present

## 2018-05-28 DIAGNOSIS — I454 Nonspecific intraventricular block: Secondary | ICD-10-CM

## 2018-05-28 MED ORDER — AMOXICILLIN 500 MG PO CAPS: 500.0000 mg | ORAL_CAPSULE | ORAL | 12 refills | Status: DC

## 2018-05-28 NOTE — Progress Notes (Signed)
[facility] [place]                                       Cardiology Office Note    Date:  05/28/2018   ID:  [name], DOB 03-15-1951, MRN [id number]  PCP:  [name], MD  Cardiologist: Fransico Him, MD / Dr. Angelena Form & Dr. Roxy Manns (TAVR)  CC: TOC s/p TAVR   History of Present Illness:  [name] is a 67 y.o. 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   Patient did well following coronary artery bypass surgery and has been followed closely ever since by Dr. Radford Pax. Echocardiograms have documented the presence of aortic stenosis with moderate to severe left ventricular systolic dysfunction with ejection fraction estimated 30 to 35%. Transvalvular gradients across the aortic valve have suggested the presence of at least moderate aortic stenosis with mean transvalvular gradient estimated 20 mmHg, DVI reported 0.22 and aortic valve area calculated 1.02 cm. Over the past few months the patient developed fairly rapid progression of symptoms of exertional shortness of breath, weight gain, orthopnea, PND, and lower extremity edema. He was hospitalized in August with acute exacerbation of congestive heart failure. He responded to IV diuresis.He was evaluated in consultation by Dr. Angelena Form and underwent diagnostic cardiac catheterization by Dr. Haroldine Laws on June 13, 2018. Catheterization revealed moderate nonobstructive disease in the left anterior descending coronary artery with continued patency of the vein grafts to the left circumflex and right coronary territories.  He underwent successful TAVR with a 29 mm Edwards Sapien 3 THV via the TF approach on 05/15/18. Post  operative echo showed EF 35%, normally functioning TAVR with mean gradient 7 mm Hg and no PVL. ECG with new IVCD but no high grade heart block. He was discharged home on ASA and plavix.   Per review of phone notes, the patient has had irregular breathing at night noted by his wife and has been set up for a sleep study.  Today he presents to clinic for follow up.  No CP or SOB. No LE edema, orthopnea or PND. No dizziness or syncope. No blood in stool or urine. No palpitations. Groin site healing well. He has been back to normal activities and having no shortness of breath. He can tell a big difference in his breathing since having surgery.    Past Medical History:  Diagnosis Date  . Acute on chronic combined systolic and diastolic CHF (congestive heart failure) ([place]) 04/16/2018  . Anemia   . Arthritis   . Chronic kidney disease   . Coronary artery disease 02/2012   a. s/p stenting in 2013  b. s/p CABGx2V (SVG--> PDA, SVG--> LCx).  . Diabetes mellitus    neuropathy  insulin dependent  . Dyslipidemia   . Erectile dysfunction   . GERD (gastroesophageal reflux disease)   . History of CVA (cerebrovascular accident)   . History of kidney stones   . Hx of radiation therapy to mediastinum 1985  . Hypertension   . Malignant seminoma of mediastinum ([place]) 1985  . S/P TAVR (transcatheter aortic valve replacement) 05/15/2018   29 mm  Edwards Sapien 3 transcatheter heart valve placed via percutaneous right transfemoral approach   . Severe aortic stenosis     Past Surgical History:  Procedure Laterality Date  . COLONOSCOPY  07/25/2012   Procedure: COLONOSCOPY;  Surgeon: Winfield Cunas., MD;  Location: [facility] ENDOSCOPY;  Service: Endoscopy;  Laterality: N/A;  . COLONOSCOPY N/A 12/02/2013   Procedure: COLONOSCOPY;  Surgeon: Winfield Cunas., MD;  Location: [facility] ENDOSCOPY;  Service: Endoscopy;  Laterality: N/A;  . CORONARY ARTERY BYPASS GRAFT N/A 01/04/2013   Procedure: CORONARY ARTERY BYPASS GRAFTING  (CABG) times two using right saphenous vein harvested with endoscope.;  Surgeon: Ivin Poot, MD;  Location: [place];  Service: Open Heart Surgery;  Laterality: N/A;  . ESOPHAGOGASTRODUODENOSCOPY  07/20/2012   Procedure: ESOPHAGOGASTRODUODENOSCOPY (EGD);  Surgeon: Winfield Cunas., MD;  Location: [facility] ENDOSCOPY;  Service: Endoscopy;  Laterality: N/A;  . ESOPHAGOGASTRODUODENOSCOPY N/A 12/02/2013   Procedure: ESOPHAGOGASTRODUODENOSCOPY (EGD);  Surgeon: Winfield Cunas., MD;  Location: [facility] ENDOSCOPY;  Service: Endoscopy;  Laterality: N/A;  . HOT HEMOSTASIS N/A 12/02/2013   Procedure: HOT HEMOSTASIS (ARGON PLASMA COAGULATION/BICAP);  Surgeon: Winfield Cunas., MD;  Location: [facility] ENDOSCOPY;  Service: Endoscopy;  Laterality: N/A;  . INTRAOPERATIVE TRANSESOPHAGEAL ECHOCARDIOGRAM N/A 01/04/2013   Procedure: INTRAOPERATIVE TRANSESOPHAGEAL ECHOCARDIOGRAM;  Surgeon: Ivin Poot, MD;  Location: [place];  Service: Open Heart Surgery;  Laterality: N/A;  . INTRAOPERATIVE TRANSTHORACIC ECHOCARDIOGRAM N/A 05/15/2018   Procedure: INTRAOPERATIVE TRANSTHORACIC ECHOCARDIOGRAM;  Surgeon: Burnell Blanks, MD;  Location: [place];  Service: Open Heart Surgery;  Laterality: N/A;  . LEFT HEART CATHETERIZATION WITH CORONARY ANGIOGRAM N/A 03/06/2012   Procedure: LEFT HEART CATHETERIZATION WITH CORONARY ANGIOGRAM;  Surgeon: [name], MD;  Location: [place] CATH LAB;  Service: Cardiovascular;  Laterality: N/A;  . LITHOTRIPSY    . [place]  . RADIAL ARTERY HARVEST Left 01/04/2013   Procedure: RADIAL ARTERY HARVEST;  Surgeon: Ivin Poot, MD;  Location: [place];  Service: Vascular;  Laterality: Left;  Artery not havested. Unsuitable for use.  . resection mediastinal seminonma    . RIGHT/LEFT HEART CATH AND CORONARY/GRAFT ANGIOGRAPHY N/A 11/15/2016   Procedure: Right/Left Heart Cath and Coronary/Graft Angiography;  Surgeon: Leonie Man, MD;  Location: [place] CV LAB;  Service: Cardiovascular;   Laterality: N/A;  . RIGHT/LEFT HEART CATH AND CORONARY/GRAFT ANGIOGRAPHY N/A 04/13/2018   Procedure: RIGHT/LEFT HEART CATH AND CORONARY/GRAFT ANGIOGRAPHY;  Surgeon: Jolaine Artist, MD;  Location: [place] CV LAB;  Service: Cardiovascular;  Laterality: N/A;  . TRANSCATHETER AORTIC VALVE REPLACEMENT, TRANSFEMORAL  05/15/2018  . TRANSCATHETER AORTIC VALVE REPLACEMENT, TRANSFEMORAL N/A 05/15/2018   Procedure: TRANSCATHETER AORTIC VALVE REPLACEMENT, TRANSFEMORAL;  Surgeon: Burnell Blanks, MD;  Location: [place];  Service: Open Heart Surgery;  Laterality: N/A;    Current Medications: Outpatient Medications Prior to Visit  Medication Sig Dispense Refill  . amitriptyline (ELAVIL) 100 MG tablet Take 100 mg by mouth daily.     Marland Kitchen aspirin 81 MG chewable tablet Chew 1 tablet (81 mg total) by mouth daily.    . B Complex-C (SUPER B COMPLEX PO) Take 1 tablet by mouth daily.    . B-D ULTRAFINE III SHORT PEN 31G X 8 MM MISC 1 each by Other route daily as needed (GLUCOSE TESTING (INSULINE NEEDLE)).     . Cholecalciferol (VITAMIN D-3) 5000 units TABS Take 5,000 Units by mouth daily.    . clopidogrel (PLAVIX) 75 MG tablet Take 1 tablet (75 mg  total) by mouth daily with breakfast. 90 tablet 1  . HUMALOG KWIKPEN 100 UNIT/ML KiwkPen Per pump;  0.8 unit(s)/per hour average and patient gives a bolus, if needed    . hydroxypropyl methylcellulose / hypromellose (ISOPTO TEARS / GONIOVISC) 2.5 % ophthalmic solution Place 1 drop into both eyes 4 (four) times daily as needed for dry eyes.    Marland Kitchen levothyroxine (SYNTHROID, LEVOTHROID) 50 MCG tablet Take 50 mcg by mouth daily before breakfast.    . metFORMIN (GLUCOPHAGE) 500 MG tablet Take 2 tablets (1,000 mg total) by mouth 2 (two) times daily with a meal.    . metoprolol succinate (TOPROL-XL) 50 MG 24 hr tablet Take 1 tablet (50 mg total) by mouth daily. Take with or immediately following a meal. 30 tablet 11  . Multiple Vitamin (MULTIVITAMIN) tablet Take 1 tablet  by mouth daily.    . ONE TOUCH ULTRA TEST test strip 1 each by Other route as needed for other.     . simvastatin (ZOCOR) 40 MG tablet Take 40 mg by mouth every evening.    . zolpidem (AMBIEN) 10 MG tablet Take 10 mg by mouth daily.      No facility-administered medications prior to visit.      Allergies:   Lipitor [atorvastatin] and Adhesive [tape]   Social History   Socioeconomic History  . Marital status: Married    Spouse name: Not on file  . Number of children: Not on file  . Years of education: Not on file  . Highest education [name]: Not on file  Occupational History  . Not on file  Social Needs  . Financial resource strain: Not on file  . Food insecurity:    Worry: Not on file    Inability: Not on file  . Transportation needs:    Medical: Not on file    Non-medical: Not on file  Tobacco Use  . Smoking status: Former Smoker    Last attempt to quit: 03/06/1985    Years since quitting: 33.2  . Smokeless tobacco: Never Used  Substance and Sexual Activity  . Alcohol use: No  . Drug use: No  . Sexual activity: Not Currently  Lifestyle  . Physical activity:    Days per week: Not on file    Minutes per session: Not on file  . Stress: Not on file  Relationships  . Social connections:    Talks on phone: Not on file    Gets together: Not on file    Attends religious service: Not on file    Active member of club or organization: Not on file    Attends meetings of clubs or organizations: Not on file    Relationship status: Not on file  Other Topics Concern  . Not on file  Social History Narrative  . Not on file     Family History:  The patient's family history includes Cancer in his brother; Diabetes in his father, mother, and sister; Heart failure in his mother; Hypertension in his mother.     ROS:   Please see the history of present illness.    ROS All other systems reviewed and are negative.   PHYSICAL EXAM:   VS:  BP 118/82   Pulse 94   Ht 5\' 11"  (1.803  m)   Wt 165 lb 6.4 oz (75 kg)   SpO2 96%   BMI 23.07 kg/m    GEN: Well nourished, well developed, in no acute distress HEENT: normal Neck: no JVD or  masses Cardiac: RRR; no murmurs, rubs, or gallops,no edema  Respiratory:  clear to auscultation bilaterally, normal work of breathing GI: soft, nontender, nondistended, + BS MS: no deformity or atrophy Skin: warm and dry, no rash. Groin sites totally healed  Neuro:  Alert and Oriented x 3, Strength and sensation are intact Psych: euthymic mood, full affect   Wt Readings from Last 3 Encounters:  05/28/18 165 lb 6.4 oz (75 kg)  05/26/18 163 lb (73.9 kg)  05/16/18 167 lb 8 oz (76 kg)      Studies/Labs Reviewed:   EKG:  EKG is ordered today.  The ekg ordered today demonstrates sinus with LBBB, HR 94  Recent Labs: 05/11/2018: ALT 55; B Natriuretic Peptide 740.6 05/16/2018: BUN 15; Creatinine, Ser 1.28; Hemoglobin 14.0; Magnesium 1.9; Platelets 165; Potassium 3.8; Sodium 138; TSH 3.157   Lipid Panel    Component Value Date/Time   CHOL 95 04/12/2018 0216   CHOL 104 03/28/2018 0850   TRIG 36 04/12/2018 0216   HDL 33 (L) 04/12/2018 0216   HDL 35 (L) 03/28/2018 0850   CHOLHDL 2.9 04/12/2018 0216   VLDL 7 04/12/2018 0216   LDLCALC 55 04/12/2018 0216   LDLCALC 58 03/28/2018 0850    Additional studies/ records that were reviewed today include:  TAVR OPERATIVE NOTE   Date of Procedure:05/15/2018  Preoperative Diagnosis:Severe Aortic Stenosis    Procedure:   Transcatheter Aortic Valve Replacement - PercutaneousRightTransfemoral Approach Edwards Sapien 3 THV (size 57mm, model # 9600TFX, serial # [id number])  Co-Surgeons:Christopher Angelena Form, MD andClarence H. Roxy Manns, MD  Pre-operative Echo Findings: ? Severe aortic stenosis ? Moderate-severeleft ventricular systolic dysfunction  Post-operative Echo Findings: ? Noparavalvular leak ? Unchangedleft  ventricular systolic function  _____________   Echo 05/16/18 Study Conclusions - Left ventricle: The cavity size was normal. Wall thickness was increased in a pattern of mild LVH. Septal-lateral dyssynchrony consistent with LBBB. Anteroseptal severe hypokinesis and inferior severe hypokinesis. The estimated ejection fraction was 35%. Diastolic function was not assessed. - Aortic valve: Bioprosthetic aortic valve s/p TAVR. No bioprosthetic valve stenosis. There was no regurgitation. Mean gradient (S): 7 mm Hg. Valve area (VTI): 2.08 cm^2. - Mitral valve: Mildly calcified annulus. There was trivial regurgitation. - Right ventricle: The cavity size was normal. Systolic function was mildly reduced. - Tricuspid valve: Peak RV-RA gradient (S): 24 mm Hg. - Pulmonary arteries: PA peak pressure: 32 mm Hg (S). - Systemic veins: IVC measured 2.4 cm with normal respirophasic variation, suggesting RA pressure 8 mmHg. Impressions: - Normal LV size with mild LV hypertrophy. EF 35% with anteroseptal and inferior severe hypokinesis. Septal-lateral dyssynchrony consistent with LBBB. Normal RV size with mildly decreased systolic function. Bioprosthetic aortic valve s/p TAVR. The valve appears well-seated with no stenosis or regurgitation.    ASSESSMENT & PLAN:   Severe low flow, low gradient AS s/p TAVR:doing excellent. He can tell a big difference in his breathing since having surgery. Groin site completely healed. ECG with no HAVB. Continue aspirin and Plavix.  We discussed the need for SBE prophylaxis and amoxicillin has been called into his pharmacy.  I will see him back 11/13 for 1 month follow up and echo. He wants to get back into cardiac rehab. I do not see a referral. I will check on this.  LBBB: this is stable.   Chronic combined S/D CHF: Appears euvolemic off diuretic therapy.  HTN: BP well controlled today.  CAD s/p CABG: pre TAVR cath showed patent  bypass grafts. Continue medical therapy.  Hx of CVA: Continue aspirin and statin.  Irregular nocturnal breathing: sleep study has been completed but not formally read.  Will defer to Dr. Radford Pax.  Pulmonary nodule: Noted on pre-TAVR CTs.  Radiology recommended noncontrast chest CT in 2 to 3 months.  I will have that set up today.  Medication Adjustments/Labs and Tests Ordered: Current medicines are reviewed at length with the patient today.  Concerns regarding medicines are outlined above.  Medication changes, Labs and Tests ordered today are listed in the Patient Instructions below. Patient Instructions  Medication Instructions:  Take amoxicillin 4 tablets (2000 mg ) 1 hour prior to dental procedure including cleanings.   Your physician discussed the importance of taking an antibiotic prior to any dental, procedures to prevent damage to the heart valves from infection. You were given a prescription for an antibiotic based on current SBE prophylaxis guidelines.  Labwork: -None  Testing/Procedures: Non-Cardiac CT scanning, (CAT scanning), is a noninvasive, special x-ray that produces cross-sectional images of the body using x-rays and a computer. CT scans help physicians diagnose and treat medical conditions. For some CT exams, a contrast material is used to enhance visibility in the area of the body being studied. CT scans provide greater clarity and reveal more details than regular x-ray exams.     Follow-Up: Your physician recommends that you keep your scheduled follow-up appointment on Thursday, November 14 @ 1:00 pm.     Any Other Special Instructions Will Be Listed Below (If Applicable).     If you need a refill on your cardiac medications before your next appointment, please call your pharmacy.      Signed, Angelena Form, PA-C  05/28/2018 3:24 PM    [facility] [place], [place], [place]  [address] Phone: [phone]; Fax: [phone]

## 2018-05-28 NOTE — Patient Instructions (Addendum)
Medication Instructions:  Take amoxicillin 4 tablets (2000 mg ) 1 hour prior to dental procedure including cleanings.   Your physician discussed the importance of taking an antibiotic prior to any dental, procedures to prevent damage to the heart valves from infection. You were given a prescription for an antibiotic based on current SBE prophylaxis guidelines.  Labwork: -None  Testing/Procedures: Non-Cardiac CT scanning, (CAT scanning), is a noninvasive, special x-ray that produces cross-sectional images of the body using x-rays and a computer. CT scans help physicians diagnose and treat medical conditions. For some CT exams, a contrast material is used to enhance visibility in the area of the body being studied. CT scans provide greater clarity and reveal more details than regular x-ray exams.     Follow-Up: Your physician recommends that you keep your scheduled follow-up appointment on Thursday, November 14 @ 1:00 pm.     Any Other Special Instructions Will Be Listed Below (If Applicable).     If you need a refill on your cardiac medications before your next appointment, please call your pharmacy.

## 2018-05-29 ENCOUNTER — Encounter (HOSPITAL_COMMUNITY): Payer: Self-pay

## 2018-05-29 ENCOUNTER — Telehealth: Payer: Self-pay | Admitting: *Deleted

## 2018-05-29 NOTE — Telephone Encounter (Signed)
-----   Message from [name], MD sent at 05/29/2018  9:54 AM EDT ----- Please let patient know that they have significant sleep apnea and had successful PAP titration and will be set up with PAP unit.  Please let DME know that order is in [facility].  Please set patient up for OV in 10 weeks

## 2018-05-29 NOTE — Procedures (Deleted)
   Patient Name: [name], [name] Date: 05/26/2018 Gender: Male D.O.B: Jun 24, 1951 Age (years): 85 Referring Provider: Fransico Him MD, ABSM Height (inches): 71 Interpreting Physician: Fransico Him MD, ABSM Weight (lbs): 163 RPSGT: Earney [place] BMI: 23 MRN: [id number] Neck Size: 14.50  CLINICAL INFORMATION  The patient is referred for a BiPAP titration to treat sleep apnea.  SLEEP STUDY TECHNIQUE  As per the AASM Manual for the Scoring of Sleep and Associated Events v2.3 (April 2016) with a hypopnea requiring 4% desaturations. The channels recorded and monitored were frontal, central and occipital EEG, electrooculogram (EOG), submentalis EMG (chin), nasal and oral airflow, thoracic and abdominal wall motion, anterior tibialis EMG, snore microphone, electrocardiogram, and pulse oximetry. Bilevel positive airway pressure (BPAP) was initiated at the beginning of the study and titrated to treat sleep-disordered breathing.  MEDICATIONS  Medications self-administered by patient taken the night of the study : ZOLPIDEM, AMITRIPYLIN  RESPIRATORY PARAMETERS  Optimal IPAP Pressure (cm):16 AHI at Optimal Pressure (/hr)N/A Optimal EPAP Pressure (cm):12 Overall Minimal O2 (%):79.0 Minimal O2 at Optimal Pressure (%):79.0  SLEEP ARCHITECTURE  Start Time:10:46:00 PM Stop Time:4:57:47 AM Total Time (min):371.8 Total Sleep Time (min):305.5 Sleep Latency (min):22.1 Sleep Efficiency (%):82.2% REM Latency (min):43.0 WASO (min):44.2 Stage N1 (%):2.9% Stage N2 (%):82.0% Stage N3 (%):0.0% Stage R (%):15.1 Supine (%):54.26 Arousal Index (/hr):16.9  CARDIAC DATA  The 2 lead EKG demonstrated sinus rhythm. The mean heart rate was 92.5 beats per minute. Other EKG findings include: None.  LEG MOVEMENT DATA  The total Periodic Limb Movements of Sleep (PLMS) were 0. The PLMS index was 0.0. A PLMS index of <15 is considered normal in adults.  IMPRESSIONS  - An optimal PAP pressure was  selected for this patient ( 16/12cm of water) - Mild Central Sleep Apnea was noted during this titration (CAI = 9.0/h). - Severe oxygen desaturations were observed during this titration (min O2 = 79.0%). - The patient snored with soft snoring volume. - No cardiac abnormalities were observed during this study. - Clinically significant periodic limb movements were not noted during this study. Arousals associated with PLMs were rare.  DIAGNOSIS  - Obstructive Sleep Apnea (327.23 [G47.33 ICD-10])  RECOMMENDATIONS  - Trial of BiPAP therapy on 16/12 cm H2O with a Medium size Fisher&Paykel Full Face Mask Simplus mask and heated humidification. - Avoid alcohol, sedatives and other CNS depressants that may worsen sleep apnea and disrupt normal sleep architecture. - Sleep hygiene should be reviewed to assess factors that may improve sleep quality. - Weight management and regular exercise should be initiated or continued. - Return to Sleep Center for re-evaluation after 10 weeks of therapy  [Electronically signed] 05/29/2018 09:51 AM  Fransico Him MD, ABSM Diplomate, American Board of Sleep Medicine

## 2018-05-29 NOTE — Telephone Encounter (Signed)
Informed patient/wife per dpr of titration results and verbalized understanding was indicated. Patient/wife  understands his sleep study showed they have significant sleep apnea and had successful PAP titration and will be set up with PAP unit. Order is in [place]. Upon patient request DME selection is [facility]. Patient understands he will be contacted by [facility] to set up his cpap. Patient understands to call if [facility] does not contact him with new setup in a timely manner. Patient understands they will be called once confirmation has been received from [facility] that they have received their new machine to schedule 10 week follow up appointment.  [facility] notified of new cpap order  Please add to airview Patient was grateful for the call and thanked me.

## 2018-05-30 DIAGNOSIS — E039 Hypothyroidism, unspecified: Secondary | ICD-10-CM | POA: Diagnosis not present

## 2018-05-30 DIAGNOSIS — E1165 Type 2 diabetes mellitus with hyperglycemia: Secondary | ICD-10-CM | POA: Diagnosis not present

## 2018-05-31 ENCOUNTER — Encounter (HOSPITAL_COMMUNITY): Payer: Self-pay

## 2018-06-01 ENCOUNTER — Encounter (HOSPITAL_COMMUNITY): Payer: Self-pay

## 2018-06-05 ENCOUNTER — Encounter (HOSPITAL_COMMUNITY): Payer: Self-pay

## 2018-06-07 ENCOUNTER — Encounter (HOSPITAL_COMMUNITY): Payer: Self-pay

## 2018-06-08 ENCOUNTER — Encounter (HOSPITAL_COMMUNITY): Payer: Self-pay

## 2018-06-12 ENCOUNTER — Encounter (HOSPITAL_COMMUNITY): Payer: Self-pay

## 2018-06-13 NOTE — Procedures (Signed)
Patient Name: [name], [name] Date: 05/26/2018 Gender: Male D.O.B: 1950/12/08 Age (years): 16 Referring Provider: Fransico Him MD, ABSM Height (inches): 71 Interpreting Physician: Fransico Him MD, ABSM Weight (lbs): 163 RPSGT: Earney Hamburg BMI: 23 MRN: [id number] Neck Size: 14.50  CLINICAL INFORMATION  Sleep Study Type: Split Night CPAP  Indication for sleep study: N/A  Epworth Sleepiness Score: 2  SLEEP STUDY TECHNIQUE  As per the AASM Manual for the Scoring of Sleep and Associated Events v2.3 (April 2016) with a hypopnea requiring 4% desaturations. The channels recorded and monitored were frontal, central and occipital EEG, electrooculogram (EOG), submentalis EMG (chin), nasal and oral airflow, thoracic and abdominal wall motion, anterior tibialis EMG, snore microphone, electrocardiogram, and pulse oximetry. Continuous positive airway pressure (CPAP) was initiated when the patient met split night criteria and was titrated according to treat sleep-disordered breathing.  MEDICATIONS  Medications self-administered by patient taken the night of the study : ZOLPIDEM, AMITRIPYLIN  RESPIRATORY PARAMETERS  Diagnostic Total AHI (/hr):30.0 RDI (/hr):39.3 OA Index (/hr):1.8 CA Index (/hr):13.7 REM AHI (/hr):43.8 NREM AHI (/hr):26.7 Supine AHI (/hr):33.2 Non-supine AHI (/hr):27.2 Min O2 Sat (%):79.0 Mean O2 (%):94.0 Time below 88% (min):3.4  Titration Optimal Pressure (cm):16/12 AHI at Optimal Pressure (/hr):0  SLEEP ARCHITECTURE  The recording time for the entire night was 371.8 minutes. During a baseline period of 164.2 minutes, the patient slept for 136.0 minutes in REM and nonREM, yielding a sleep efficiency of 82.8%%. Sleep onset after lights out was 22.1 minutes with a REM latency of 43.0 minutes. The patient spent 2.9%% of the night in stage N1 sleep, 77.9%% in stage N2 sleep, 0.0%% in stage N3 and 19.1% in REM.  During the titration period of 198.5 minutes,  the patient slept for 169.5 minutes in REM and nonREM, yielding a sleep efficiency of 85.4%%. Sleep onset after CPAP initiation was 13.3 minutes with a REM latency of 59.5 minutes. The patient spent 2.9%% of the night in stage N1 sleep, 85.3%% in stage N2 sleep, 0.0%% in stage N3 and 11.8% in REM.  CARDIAC DATA  The 2 lead EKG demonstrated sinus rhythm. The mean heart rate was 100.0 beats per minute. Other EKG findings include: None.  LEG MOVEMENT DATA  The total Periodic Limb Movements of Sleep (PLMS) were 0. The PLMS index was 0.0 .  IMPRESSIONS  - Severe obstructive sleep apnea occurred during the diagnostic portion of the study (AHI = 30.0/hour). An optimal PAP pressure was selected for this patient ( 16 cm of water) - Mild central sleep apnea occurred during the diagnostic portion of the study (CAI = 13.7/hour). - Mild oxygen desaturation was noted during the diagnostic portion of the study (Min O2 = 79.0%). - The patient snored with soft snoring volume during the diagnostic portion of the study. - No cardiac abnormalities were noted during this study. - Clinically significant periodic limb movements did not occur during sleep.  DIAGNOSIS  - Obstructive Sleep Apnea (327.23 [G47.33 ICD-10])  RECOMMENDATIONS  - Trial of BiPAP therapy on 16/12 cm H2O with a Medium size Fisher&Paykel Full Face Mask Simplus mask and heated humidification. - Avoid alcohol, sedatives and other CNS depressants that may worsen sleep apnea and disrupt normal sleep architecture. - Sleep hygiene should be reviewed to assess factors that may improve sleep quality. - Weight management and regular exercise should be initiated or continued. - Return to Sleep Center for re-evaluation after 10 weeks of therapy  [Electronically signed] 06/13/2018 07:38 PM  Fransico Him MD, ABSM  Diplomate, [employer]

## 2018-06-14 ENCOUNTER — Encounter (HOSPITAL_COMMUNITY): Payer: Self-pay

## 2018-06-14 ENCOUNTER — Encounter: Payer: Self-pay | Admitting: *Deleted

## 2018-06-14 NOTE — Telephone Encounter (Signed)
This encounter was created in error - please disregard.

## 2018-06-15 ENCOUNTER — Encounter (HOSPITAL_COMMUNITY): Payer: Self-pay

## 2018-06-18 DIAGNOSIS — Z9641 Presence of insulin pump (external) (internal): Secondary | ICD-10-CM | POA: Diagnosis not present

## 2018-06-18 DIAGNOSIS — E7800 Pure hypercholesterolemia, unspecified: Secondary | ICD-10-CM | POA: Diagnosis not present

## 2018-06-18 DIAGNOSIS — N529 Male erectile dysfunction, unspecified: Secondary | ICD-10-CM | POA: Diagnosis not present

## 2018-06-18 DIAGNOSIS — N189 Chronic kidney disease, unspecified: Secondary | ICD-10-CM | POA: Diagnosis not present

## 2018-06-18 DIAGNOSIS — I10 Essential (primary) hypertension: Secondary | ICD-10-CM | POA: Diagnosis not present

## 2018-06-18 DIAGNOSIS — E039 Hypothyroidism, unspecified: Secondary | ICD-10-CM | POA: Diagnosis not present

## 2018-06-18 DIAGNOSIS — E1165 Type 2 diabetes mellitus with hyperglycemia: Secondary | ICD-10-CM | POA: Diagnosis not present

## 2018-06-19 ENCOUNTER — Telehealth (HOSPITAL_COMMUNITY): Payer: Self-pay | Admitting: *Deleted

## 2018-06-19 ENCOUNTER — Encounter (HOSPITAL_COMMUNITY): Payer: Self-pay

## 2018-06-19 ENCOUNTER — Encounter (HOSPITAL_COMMUNITY)
Admission: RE | Admit: 2018-06-19 | Discharge: 2018-06-19 | Disposition: A | Discharge status: Home / Self Care | Payer: Self-pay | Source: Ambulatory Visit | Attending: Cardiology | Admitting: Cardiology

## 2018-06-19 DIAGNOSIS — E119 Type 2 diabetes mellitus without complications: Secondary | ICD-10-CM | POA: Insufficient documentation

## 2018-06-19 DIAGNOSIS — I5022 Chronic systolic (congestive) heart failure: Secondary | ICD-10-CM | POA: Insufficient documentation

## 2018-06-19 DIAGNOSIS — Z951 Presence of aortocoronary bypass graft: Secondary | ICD-10-CM | POA: Insufficient documentation

## 2018-06-19 DIAGNOSIS — I110 Hypertensive heart disease with heart failure: Secondary | ICD-10-CM | POA: Insufficient documentation

## 2018-06-19 DIAGNOSIS — Z9889 Other specified postprocedural states: Secondary | ICD-10-CM | POA: Insufficient documentation

## 2018-06-19 DIAGNOSIS — G4733 Obstructive sleep apnea (adult) (pediatric): Secondary | ICD-10-CM | POA: Insufficient documentation

## 2018-06-19 DIAGNOSIS — Z87891 Personal history of nicotine dependence: Secondary | ICD-10-CM | POA: Insufficient documentation

## 2018-06-19 DIAGNOSIS — I2510 Atherosclerotic heart disease of native coronary artery without angina pectoris: Secondary | ICD-10-CM | POA: Insufficient documentation

## 2018-06-19 NOTE — Telephone Encounter (Signed)
-----   Message from [name], MD sent at 06/19/2018 10:32 AM EST ----- Regarding: RE: Metoprolol dose If he is currently taking 75 mg daily then I would continue that and update the med chart  [facility] ----- Message ----- From: Rowe Pavy, RN Sent: 06/19/2018  10:14 AM EST To: [name], MD Subject: Metoprolol dose                                Dr. Radford Pax,  Need some help in clarifying the metoprolol dose for the above pt.  Pt started back with [place] in cardiac maintenance s/p TAVR.  Reviewed pt medication list.  Per pt he is taking Metoprolol 75 mg XL 75 mg.  Listed on his medication list Metoprolol 50 mg XL.  In looking back at his notes in epic you had made a notation on 8/14 that he should "continue his metoprolol XL 75 mg". In subsequent progress notes the medication list says metoprolol XL 50 mg.  This is listed on his discharge summaries as well.  Pt will see Inez Catalina lawerence PA next week in the TAVR clinic.  What dosage should the pt take?  He has been taking one 50 mg tablet and cutting a second pill in half to total 75 mg.  I asked him to read the bottle to me and it is for the 50 mg.   Thanks for your help Maurice Small RN, BSN Cardiac and Pulmonary Rehab Nurse Navigator

## 2018-06-21 ENCOUNTER — Encounter (HOSPITAL_COMMUNITY)
Admission: RE | Admit: 2018-06-21 | Discharge: 2018-06-21 | Disposition: A | Discharge status: Home / Self Care | Payer: Self-pay | Source: Ambulatory Visit | Attending: Cardiology | Admitting: Cardiology

## 2018-06-21 ENCOUNTER — Encounter (HOSPITAL_COMMUNITY): Payer: Self-pay

## 2018-06-22 ENCOUNTER — Encounter (HOSPITAL_COMMUNITY): Payer: Self-pay

## 2018-06-22 ENCOUNTER — Encounter (HOSPITAL_COMMUNITY)
Admission: RE | Admit: 2018-06-22 | Discharge: 2018-06-22 | Disposition: A | Discharge status: Home / Self Care | Payer: Self-pay | Source: Ambulatory Visit | Attending: Cardiology | Admitting: Cardiology

## 2018-06-26 ENCOUNTER — Encounter (HOSPITAL_COMMUNITY): Payer: Self-pay

## 2018-06-26 NOTE — Telephone Encounter (Signed)
RE: Sept. 9  Received: 1 week ago  Radford Pax, Eber Hong, MD  Freada Bergeron, CMA        I cannot do that because it did not occur until after that OV. There is a note in detail about problems and should not have to have an office visit just for that   [place]     ----- Message -----  From: Freada Bergeron, CMA  Sent: 06/14/2018 12:40 PM EDT  To: [name], MD  Subject: Sept. 9                      Hello Dr Radford Pax,  Patient had a sleep study but he does not have a face to face note stating that a sleep study will be ordered. You saw the patient on sept. 9 but there is no mention of a sleep study except in a telephone encounter on Oct. 7 where you stated "He could be having Cheyne-Stokes breathing. Please order a split-night sleep study that needs to be done this week ASAP." the dme will not except a telephone encounter it has to be a face to face note. Could you please addend your Sept. 9 note to say patient will be evaluated for OSA and a sleep study will be ordered so the patient does not have to repeat the office visit and the sleep study.

## 2018-06-26 NOTE — Progress Notes (Signed)
[facility]                                       Cardiology Office Note    Date:  06/27/2018   ID:  [name], DOB [birth year], MRN [id number]  PCP:  Bill Amel, MD  Cardiologist: Bill Him, MD / Dr. Angelena [name] & Dr. Roxy [name] (TAVR)  CC: 1 month s/p TAVR   History of Present Illness:  [name] is a 67 y.o. male with a history of mediastinal seminoma treated with surgical resection followed by radiation therapy, previous CVA,CADs/pprevious stenting and ultimately CABG x2 in 2014,HTN, HLD, DMT2, CKD stage II, chronic combined S/D CHF and low flow, low gradient AS with AI s/p TAVR (05/15/18) who presents to clinic for follow up.   Echo 04/02/18 showed moderately reduced LVF with EF 30-35% with moderate AS and mild AI, but gradients felt to be underestimated with reduced EF. He was admitted in 8/28-04/16/18 with acute exacerbation of congestive heart failure.Catheterization revealed moderate nonobstructive disease in the LAD with continued patency of the vein grafts to the left circumflex and right coronary territories.  He underwent successful TAVR with a 29 mm Edwards Sapien 3 THV via the TF approach on 05/15/18. Post operative echo showed EF 35%, normally functioning TAVR with mean gradient 7 mm Hg and no PVL. ECG with new IVCD but no high grade heart block. He was discharged home on ASA and plavix.   Per review of phone notes, the patient has had irregular breathing at night noted by his wife and has been set up for a sleep study.  Today he presents to clinic for follow up. He is doing quite well. Has an intermittent cough but no fevers or chills. It is sometimes productive but he is not sure of the color. No CP or SOB. No LE edema, orthopnea or PND. No dizziness or syncope. No blood in stool or urine. No palpitations. He feels much better since having surgery. He just had a sleep study and was told he had sleep apnea but is  having trouble getting his mask.    Past Medical History:  Diagnosis Date  . Acute on chronic combined systolic and diastolic CHF (congestive heart failure) ([place]) 04/16/2018  . Anemia   . Arthritis   . Chronic kidney disease   . Coronary artery disease 02/2012   a. s/p stenting in 2013  b. s/p CABGx2V (SVG--> PDA, SVG--> LCx).  . Diabetes mellitus    neuropathy  insulin dependent  . Dyslipidemia   . Erectile dysfunction   . GERD (gastroesophageal reflux disease)   . History of CVA (cerebrovascular accident)   . History of kidney stones   . Hx of radiation therapy to mediastinum 1985  . Hypertension   . Malignant seminoma of mediastinum ([place]) 1985  . S/P TAVR (transcatheter aortic valve replacement) 05/15/2018   29 mm Edwards Sapien 3 transcatheter heart valve placed via percutaneous right transfemoral approach   . Severe aortic stenosis     Past Surgical History:  Procedure Laterality Date  . COLONOSCOPY  07/25/2012   Procedure: COLONOSCOPY;  Surgeon: Winfield Cunas., MD;  Location: [facility] ENDOSCOPY;  Service: Endoscopy;  Laterality: N/A;  . COLONOSCOPY N/A 12/02/2013   Procedure: COLONOSCOPY;  Surgeon: Winfield Cunas., MD;  Location: [facility] ENDOSCOPY;  Service: Endoscopy;  Laterality:  N/A;  . CORONARY ARTERY BYPASS GRAFT N/A 01/04/2013   Procedure: CORONARY ARTERY BYPASS GRAFTING (CABG) times two using right saphenous vein harvested with endoscope.;  Surgeon: Ivin Poot, MD;  Location: [place];  Service: Open Heart Surgery;  Laterality: N/A;  . ESOPHAGOGASTRODUODENOSCOPY  07/20/2012   Procedure: ESOPHAGOGASTRODUODENOSCOPY (EGD);  Surgeon: Winfield Cunas., MD;  Location: [facility] ENDOSCOPY;  Service: Endoscopy;  Laterality: N/A;  . ESOPHAGOGASTRODUODENOSCOPY N/A 12/02/2013   Procedure: ESOPHAGOGASTRODUODENOSCOPY (EGD);  Surgeon: Winfield Cunas., MD;  Location: [facility] ENDOSCOPY;  Service: Endoscopy;  Laterality: N/A;  . HOT HEMOSTASIS N/A 12/02/2013   Procedure: HOT HEMOSTASIS (ARGON  PLASMA COAGULATION/BICAP);  Surgeon: Winfield Cunas., MD;  Location: [facility] ENDOSCOPY;  Service: Endoscopy;  Laterality: N/A;  . INTRAOPERATIVE TRANSESOPHAGEAL ECHOCARDIOGRAM N/A 01/04/2013   Procedure: INTRAOPERATIVE TRANSESOPHAGEAL ECHOCARDIOGRAM;  Surgeon: Ivin Poot, MD;  Location: [place];  Service: Open Heart Surgery;  Laterality: N/A;  . INTRAOPERATIVE TRANSTHORACIC ECHOCARDIOGRAM N/A 05/15/2018   Procedure: INTRAOPERATIVE TRANSTHORACIC ECHOCARDIOGRAM;  Surgeon: Burnell Blanks, MD;  Location: [place];  Service: Open Heart Surgery;  Laterality: N/A;  . LEFT HEART CATHETERIZATION WITH CORONARY ANGIOGRAM N/A 03/06/2012   Procedure: LEFT HEART CATHETERIZATION WITH CORONARY ANGIOGRAM;  Surgeon: [name], MD;  Location: [place] CATH LAB;  Service: Cardiovascular;  Laterality: N/A;  . LITHOTRIPSY    . [place]  . RADIAL ARTERY HARVEST Left 01/04/2013   Procedure: RADIAL ARTERY HARVEST;  Surgeon: Ivin Poot, MD;  Location: [place];  Service: Vascular;  Laterality: Left;  Artery not havested. Unsuitable for use.  . resection mediastinal seminonma    . RIGHT/LEFT HEART CATH AND CORONARY/GRAFT ANGIOGRAPHY N/A 11/15/2016   Procedure: Right/Left Heart Cath and Coronary/Graft Angiography;  Surgeon: Leonie Man, MD;  Location: [place] CV LAB;  Service: Cardiovascular;  Laterality: N/A;  . RIGHT/LEFT HEART CATH AND CORONARY/GRAFT ANGIOGRAPHY N/A 04/13/2018   Procedure: RIGHT/LEFT HEART CATH AND CORONARY/GRAFT ANGIOGRAPHY;  Surgeon: Jolaine Artist, MD;  Location: [place] CV LAB;  Service: Cardiovascular;  Laterality: N/A;  . TRANSCATHETER AORTIC VALVE REPLACEMENT, TRANSFEMORAL  05/15/2018  . TRANSCATHETER AORTIC VALVE REPLACEMENT, TRANSFEMORAL N/A 05/15/2018   Procedure: TRANSCATHETER AORTIC VALVE REPLACEMENT, TRANSFEMORAL;  Surgeon: Burnell Blanks, MD;  Location: [place];  Service: Open Heart Surgery;  Laterality: N/A;    Current Medications: Outpatient  Medications Prior to Visit  Medication Sig Dispense Refill  . amitriptyline (ELAVIL) 100 MG tablet Take 100 mg by mouth daily.     Marland Kitchen amoxicillin (AMOXIL) 500 MG capsule Take 1 capsule (500 mg total) by mouth as directed. Take 4 tablets (2000 mg ) 1 hour prior to dental procedures. 12 capsule 12  . aspirin 81 MG chewable tablet Chew 1 tablet (81 mg total) by mouth daily.    . B Complex-C (SUPER B COMPLEX PO) Take 1 tablet by mouth daily.    . B-D ULTRAFINE III SHORT PEN 31G X 8 MM MISC 1 each by Other route daily as needed (GLUCOSE TESTING (INSULINE NEEDLE)).     . Cholecalciferol (VITAMIN D-3) 5000 units TABS Take 5,000 Units by mouth daily.    . clopidogrel (PLAVIX) 75 MG tablet Take 1 tablet (75 mg total) by mouth daily with breakfast. 90 tablet 1  . HUMALOG KWIKPEN 100 UNIT/ML KiwkPen Per pump;  0.8 unit(s)/per hour average and patient gives a bolus, if needed    . hydroxypropyl methylcellulose / hypromellose (ISOPTO TEARS / GONIOVISC) 2.5 % ophthalmic solution Place 1  drop into both eyes 4 (four) times daily as needed for dry eyes.    Marland Kitchen levothyroxine (SYNTHROID, LEVOTHROID) 50 MCG tablet Take 50 mcg by mouth daily before breakfast. Per Dr. Chalmers Cater - 11/4  Pt instructed to take 1 1/2 every day Monday thru Saturday.  On Sunday take 2 tablets.    . metFORMIN (GLUCOPHAGE) 500 MG tablet Take 2 tablets (1,000 mg total) by mouth 2 (two) times daily with a meal.    . metoprolol succinate (TOPROL-XL) 50 MG 24 hr tablet Take 75 mg by mouth daily. Take with or immediately following a meal.    . Multiple Vitamin (MULTIVITAMIN) tablet Take 1 tablet by mouth daily.    . ONE TOUCH ULTRA TEST test strip 1 each by Other route as needed for other.     . simvastatin (ZOCOR) 40 MG tablet Take 40 mg by mouth every evening.    . zolpidem (AMBIEN) 10 MG tablet Take 10 mg by mouth daily.     . metoprolol succinate (TOPROL-XL) 50 MG 24 hr tablet Take 1 tablet (50 mg total) by mouth daily. Take with or immediately  following a meal. (Patient not taking: Reported on 06/27/2018) 30 tablet 11   No facility-administered medications prior to visit.      Allergies:   Lipitor [atorvastatin] and Adhesive [tape]   Social History   Socioeconomic History  . Marital status: Married    Spouse name: Not on file  . Number of children: Not on file  . Years of education: Not on file  . Highest education [name]: Not on file  Occupational History  . Not on file  Social Needs  . Financial resource strain: Not on file  . Food insecurity:    Worry: Not on file    Inability: Not on file  . Transportation needs:    Medical: Not on file    Non-medical: Not on file  Tobacco Use  . Smoking status: Former Smoker    Last attempt to quit: 03/06/1985    Years since quitting: 33.3  . Smokeless tobacco: Never Used  Substance and Sexual Activity  . Alcohol use: No  . Drug use: No  . Sexual activity: Not Currently  Lifestyle  . Physical activity:    Days per week: Not on file    Minutes per session: Not on file  . Stress: Not on file  Relationships  . Social connections:    Talks on phone: Not on file    Gets together: Not on file    Attends religious service: Not on file    Active member of club or organization: Not on file    Attends meetings of clubs or organizations: Not on file    Relationship status: Not on file  Other Topics Concern  . Not on file  Social History Narrative  . Not on file     Family History:  The patient's family history includes Cancer in his brother; Diabetes in his father, mother, and sister; Heart failure in his mother; Hypertension in his mother.     ROS:   Please see the history of present illness.    ROS All other systems reviewed and are negative.   PHYSICAL EXAM:   VS:  BP 118/76   Pulse 96   Ht 5\' 11"  (1.803 m)   Wt 165 lb (74.8 kg)   SpO2 98%   BMI 23.01 kg/m    GEN: Well nourished, well developed, in no acute distress HEENT: normal Neck:  no JVD or  masses Cardiac: RRR; no murmurs, rubs, or gallops,no edema  Respiratory:  clear to auscultation bilaterally, normal work of breathing GI: soft, nontender, nondistended, + BS MS: no deformity or atrophy Skin: warm and dry, no rash Neuro:  Alert and Oriented x 3, Strength and sensation are intact Psych: euthymic mood, full affect  Wt Readings from Last 3 Encounters:  06/27/18 165 lb (74.8 kg)  05/28/18 165 lb 6.4 oz (75 kg)  05/26/18 163 lb (73.9 kg)      Studies/Labs Reviewed:   EKG:  EKG is NOT ordered today.    Recent Labs: 05/11/2018: ALT 55; B Natriuretic Peptide 740.6 05/16/2018: BUN 15; Creatinine, Ser 1.28; Hemoglobin 14.0; Magnesium 1.9; Platelets 165; Potassium 3.8; Sodium 138; TSH 3.157   Lipid Panel    Component Value Date/Time   CHOL 95 04/12/2018 0216   CHOL 104 03/28/2018 0850   TRIG 36 04/12/2018 0216   HDL 33 (L) 04/12/2018 0216   HDL 35 (L) 03/28/2018 0850   CHOLHDL 2.9 04/12/2018 0216   VLDL 7 04/12/2018 0216   LDLCALC 55 04/12/2018 0216   LDLCALC 58 03/28/2018 0850    Additional studies/ records that were reviewed today include:  TAVR OPERATIVE NOTE   Date of Procedure:05/15/2018  Preoperative Diagnosis:Severe Aortic Stenosis    Procedure:   Transcatheter Aortic Valve Replacement - PercutaneousRightTransfemoral Approach Edwards Sapien 3 THV (size 22mm, model # 9600TFX, serial # [id number])  Co-Surgeons:Christopher Bill Form, MD andClarence H. Bill Manns, MD  Pre-operative Echo Findings: ? Severe aortic stenosis ? Moderate-severeleft ventricular systolic dysfunction  Post-operative Echo Findings: ? Noparavalvular leak ? Unchangedleft ventricular systolic function  _____________   Echo 05/16/18 Study Conclusions - Left ventricle: The cavity size was normal. Wall thickness was increased in a pattern of mild LVH. Septal-lateral dyssynchrony consistent with LBBB.  Anteroseptal severe hypokinesis and inferior severe hypokinesis. The estimated ejection fraction was 35%. Diastolic function was not assessed. - Aortic valve: Bioprosthetic aortic valve s/p TAVR. No bioprosthetic valve stenosis. There was no regurgitation. Mean gradient (S): 7 mm Hg. Valve area (VTI): 2.08 cm^2. - Mitral valve: Mildly calcified annulus. There was trivial regurgitation. - Right ventricle: The cavity size was normal. Systolic function was mildly reduced. - Tricuspid valve: Peak RV-RA gradient (S): 24 mm Hg. - Pulmonary arteries: PA peak pressure: 32 mm Hg (S). - Systemic veins: IVC measured 2.4 cm with normal respirophasic variation, suggesting RA pressure 8 mmHg. Impressions: - Normal LV size with mild LV hypertrophy. EF 35% with anteroseptal and inferior severe hypokinesis. Septal-lateral dyssynchrony consistent with LBBB. Normal RV size with mildly decreased systolic function. Bioprosthetic aortic valve s/p TAVR. The valve appears well-seated with no stenosis or regurgitation.  _____________   Echo 06/27/18 (1 month s/p TAVR) Study Conclusions - Left ventricle: The cavity size was normal. Systolic function was   moderately to severely reduced. The estimated ejection fraction   was in the range of 30% to 35%. Severe hypokinesis of the   anteroseptal and inferior myocardium. - Ventricular septum: Septal motion showed abnormal function and   dyssynergy. - Aortic valve: A prosthesis was present and functioning normally.   The prosthesis had a normal range of motion. The sewing ring   appeared normal, had no rocking motion, and showed no evidence of   dehiscence. There was mild perivalvular regurgitation. - Mitral valve: There was mild regurgitation. - Right ventricle: Systolic function was moderately reduced. - Atrial septum: No defect or patent foramen ovale was identified. - Pulmonary arteries: Systolic pressure  was moderately to  severely   increased. PA peak pressure: 46 mm Hg (S). Impressions: - S/P TAVR. 3D EF calculated at 33%. Mild perivalvular aortic   regurgitation. Elevated right sided pressures.  ASSESSMENT & PLAN:   Severe AS s/p TAVR: echo today shows EF 30-35%, normally functioning TAVR with mean gradient of 6 mm Hg and mild PVL. He has NYHA class I symptoms. SBE prophylaxis discussed; he has amoxicillin. Plavix can be discontinued after 6 months of therapy.   Chronic combined S/D CHF: appears euvolemic. Continue current regimen  HTN: BP well controlled today  CAD s/p CABG: pre TAVR cath showed patent bypass grafts. Continue medical therapy.  Irregular nocturnal breathing: sleep study has been completed and he was told her has sleep apnea. He is having trouble getting his CPAP mask. Will discuss with Dr. Radford Pax   Pulmonary nodule: Noted on pre-TAVR CTs.  Radiology recommended noncontrast chest CT in 2 to 3 months.  This was completed today, but not formally read.   Medication Adjustments/Labs and Tests Ordered: Current medicines are reviewed at length with the patient today.  Concerns regarding medicines are outlined above.  Medication changes, Labs and Tests ordered today are listed in the Patient Instructions below. Patient Instructions  Medication Instructions:  1) STOP PLAVIX November 14, 2018  Labwork: None  Testing/Procedures: None  Follow-Up: Dr. Theodosia Blender PAP assistant will call you with an update.  You will be called to schedule your 1 year TAVR echocardiogram and office visit!    Signed, Bill Form, PA-C  06/27/2018 8:48 PM    [facility] [address], [place], [place]  [address] Phone: [phone]; Fax: [phone]

## 2018-06-27 ENCOUNTER — Other Ambulatory Visit: Payer: Self-pay | Admitting: Physician Assistant

## 2018-06-27 ENCOUNTER — Encounter: Payer: Self-pay | Admitting: Physician Assistant

## 2018-06-27 ENCOUNTER — Ambulatory Visit (HOSPITAL_COMMUNITY): Payer: Medicare Other | Attending: Cardiology

## 2018-06-27 ENCOUNTER — Ambulatory Visit (INDEPENDENT_AMBULATORY_CARE_PROVIDER_SITE_OTHER)
Admission: RE | Admit: 2018-06-27 | Discharge: 2018-06-27 | Disposition: A | Discharge status: Home / Self Care | Payer: Medicare Other | Source: Ambulatory Visit | Attending: Physician Assistant | Admitting: Physician Assistant

## 2018-06-27 ENCOUNTER — Other Ambulatory Visit: Payer: Self-pay

## 2018-06-27 ENCOUNTER — Ambulatory Visit (INDEPENDENT_AMBULATORY_CARE_PROVIDER_SITE_OTHER): Payer: Medicare Other | Admitting: Physician Assistant

## 2018-06-27 VITALS — BP 118/76 | HR 96 | Ht 71.0 in | Wt 165.0 lb

## 2018-06-27 DIAGNOSIS — Z951 Presence of aortocoronary bypass graft: Secondary | ICD-10-CM

## 2018-06-27 DIAGNOSIS — I5042 Chronic combined systolic (congestive) and diastolic (congestive) heart failure: Secondary | ICD-10-CM

## 2018-06-27 DIAGNOSIS — Z952 Presence of prosthetic heart valve: Secondary | ICD-10-CM

## 2018-06-27 DIAGNOSIS — R911 Solitary pulmonary nodule: Secondary | ICD-10-CM

## 2018-06-27 DIAGNOSIS — R918 Other nonspecific abnormal finding of lung field: Secondary | ICD-10-CM | POA: Diagnosis not present

## 2018-06-27 DIAGNOSIS — I2510 Atherosclerotic heart disease of native coronary artery without angina pectoris: Secondary | ICD-10-CM | POA: Diagnosis not present

## 2018-06-27 DIAGNOSIS — R0600 Dyspnea, unspecified: Secondary | ICD-10-CM

## 2018-06-27 NOTE — Patient Instructions (Signed)
Medication Instructions:  1) STOP PLAVIX November 14, 2018  Labwork: None  Testing/Procedures: None  Follow-Up: Dr. Theodosia Blender PAP assistant will call you with an update.  You will be called to schedule your 1 year TAVR echocardiogram and office visit!

## 2018-06-28 ENCOUNTER — Encounter (HOSPITAL_COMMUNITY)
Admission: RE | Admit: 2018-06-28 | Discharge: 2018-06-28 | Disposition: A | Discharge status: Home / Self Care | Payer: Self-pay | Source: Ambulatory Visit | Attending: Cardiology | Admitting: Cardiology

## 2018-06-28 ENCOUNTER — Telehealth: Payer: Self-pay

## 2018-06-28 ENCOUNTER — Encounter (HOSPITAL_COMMUNITY): Payer: Self-pay

## 2018-06-28 DIAGNOSIS — J9811 Atelectasis: Secondary | ICD-10-CM

## 2018-06-28 NOTE — Telephone Encounter (Signed)
Patient has been scheduled for Wednesday 07/04/18 at 11:40.  Pt/wife is aware and agreeable to treatment.

## 2018-06-28 NOTE — Telephone Encounter (Signed)
-----   Message from Eileen Stanford, PA-C sent at 06/28/2018  8:32 AM EST ----- CT scan shows 20mm RUL pulmonary nodule that is stable. Persistent areas of atelectasis in the medial aspects of the superior segments of the lower lobes with persistent parenchymal opacity in the medial LEFT lower lobe, could represent rounded atelectasis or infiltrate though underlying mass is not excluded; follow-up in imaging in 3 months is recommended to exclude underlying mass. There was also minimal aneurysmal dilatation of the ascending thoracic aorta 4.1 cm diameter. Yearly follow up with a CTA or MRI is recommended.   We will get a non contrast CT scan set up for 3 months to reassess the area of the left lower lung and exclude mass.  I will defer annual follow up of thoracic aortic aneurysm to his primary cardiologist. This would be due in 06/2019.

## 2018-06-28 NOTE — Telephone Encounter (Signed)
Nell Range, PA personally spoke with patient's wife.  Non-contrasted chest CT ordered for scheduling in 3 months.

## 2018-06-28 NOTE — Telephone Encounter (Signed)
  Theodoro Parma, RN  Freada Bergeron, CMA; Theodoro Parma, RN        Artemio Aly and I saw this patient yesterday. He had his sleep study recently but has been unable to get supplies/set up with a machine. They have been told Medicare will not approve them.   I read your and Dr. Theodosia Blender notes in detail and see the discrepancy where there is no face to face visit to order the sleep study (which is probably why Medicare will not approve him).   I told the patient you would update him ASAP as he and his wife are very frustrated. We need to get a solution for this- he has paid for the studies and been diagnosed. Please contact [facility] and see options. If Dr. Radford Pax has to see him, please schedule him ASAP.   Thank you,  Valetta Fuller

## 2018-06-28 NOTE — Telephone Encounter (Signed)
Eileen Stanford, PA-C  [name], MD; Freada Bergeron, CMA        Dr. Radford Pax had this patient set up for a sleep study. It was positive for sleep apnea and DME wont cover the mask because there was no face to face encounter. Patient and wife are very aggravated. How can we fix this so he can get his supplies? Thank you!  KT

## 2018-06-29 ENCOUNTER — Encounter (HOSPITAL_COMMUNITY)
Admission: RE | Admit: 2018-06-29 | Discharge: 2018-06-29 | Disposition: A | Discharge status: Home / Self Care | Payer: Self-pay | Source: Ambulatory Visit | Attending: Cardiology | Admitting: Cardiology

## 2018-06-29 ENCOUNTER — Encounter (HOSPITAL_COMMUNITY): Payer: Self-pay

## 2018-07-03 ENCOUNTER — Encounter (HOSPITAL_COMMUNITY): Payer: Self-pay

## 2018-07-03 ENCOUNTER — Encounter (HOSPITAL_COMMUNITY)
Admission: RE | Admit: 2018-07-03 | Discharge: 2018-07-03 | Disposition: A | Discharge status: Home / Self Care | Payer: Self-pay | Source: Ambulatory Visit | Attending: Cardiology | Admitting: Cardiology

## 2018-07-04 ENCOUNTER — Encounter: Payer: Self-pay | Admitting: Cardiology

## 2018-07-04 ENCOUNTER — Ambulatory Visit (INDEPENDENT_AMBULATORY_CARE_PROVIDER_SITE_OTHER): Payer: Medicare Other | Admitting: Cardiology

## 2018-07-04 VITALS — BP 110/64 | HR 76 | Ht 71.0 in | Wt 167.0 lb

## 2018-07-04 DIAGNOSIS — I10 Essential (primary) hypertension: Secondary | ICD-10-CM

## 2018-07-04 DIAGNOSIS — G4733 Obstructive sleep apnea (adult) (pediatric): Secondary | ICD-10-CM

## 2018-07-04 DIAGNOSIS — I2510 Atherosclerotic heart disease of native coronary artery without angina pectoris: Secondary | ICD-10-CM | POA: Diagnosis not present

## 2018-07-04 DIAGNOSIS — I5022 Chronic systolic (congestive) heart failure: Secondary | ICD-10-CM | POA: Insufficient documentation

## 2018-07-04 HISTORY — DX: Obstructive sleep apnea (adult) (pediatric): G47.33

## 2018-07-04 HISTORY — DX: Chronic systolic (congestive) heart failure: I50.22

## 2018-07-04 NOTE — Progress Notes (Signed)
Cardiology Office Note:    Date:  07/04/2018  ID:  [name], DOB [birth year], MRN [id number]  PCP:  [name], MD  Cardiologist:  Fransico Him, MD    Referring MD: [name], MD   Chief Complaint  Patient presents with  . NO COMPLAINTS    History of Present Illness:    [name] is a 67 y.o. male with a hx of CAD, aortic stenosis and systolic HF. Hehad anon-STEMI in 2013 treated with PCI of the LCx/OM. LHC in 5/14 demonstrated severe 2 vessel CAD with critical in-stent restenosis in the LCx. He underwent CABG (SVG-PDA, SVG-LCx). He also has a history of aortic stenosis. Echo 10/2016 showed moderately reduced LVF at 35-40% with diffuse HK, mild AS and moderate AR. Right and Left heart cath showed mild to moderate AS with AVA 0.837cm2 and EF 45% with patent SVG to RPDA and SVG to OM1 With occluded native vessels.   He ultimately was diagnosed with low gradient aortic stenosis with AI and underwent TAVR with a 29 mm Edwards Sapein 3 THV via transfemoral approach after being admitted with acute exacerbation of CHF on 05/15/2018.    His wife called in a while back stating that she has noticed her husband having erratic breathing at night and was concerned he might be having sleep apnea.  She said he would snore some and then would notice a to have periods of apnea followed by heavy fast breathing.  A sleep study was ordered which showed severe obstructive sleep apnea with an AHI of 30/h and mild central sleep apnea at 13.7/h with oxygen desaturations as low as 79%.  He underwent CPAP titration but was not successful due to ongoing respiratory events and was ultimately ordered on BiPAP therapy at 16/12 centimeters H2O.  Unfortunately insurance would not provide him with his device because he had not had a recent office visit that had addressed possibility of sleep apnea.  He is therefore here today for evaluation prior to insurance approving his CPAP device.  His wife states that  he has a lot of episodes at night where he is breathing seems to cease and then he will have rapid breathing that is heavy.  The patient does not arouse from sleep with this.  He denies any awakening gasping for breath.  His wife says he does snore.  He says he is not really noticed that he feels sleepy in the morning and has no problems with excessive daytime sleepiness.  Past Medical History:  Diagnosis Date  . Acute on chronic combined systolic and diastolic CHF (congestive heart failure) ([place]) 04/16/2018  . Anemia   . Arthritis   . Chronic kidney disease   . Chronic systolic CHF (congestive heart failure) ([place]) 07/04/2018  . Coronary artery disease 02/2012   a. s/p stenting in 2013  b. s/p CABGx2V (SVG--> PDA, SVG--> LCx).  . Diabetes mellitus    neuropathy  insulin dependent  . Dyslipidemia   . Erectile dysfunction   . GERD (gastroesophageal reflux disease)   . History of CVA (cerebrovascular accident)   . History of kidney stones   . Hx of radiation therapy to mediastinum 1985  . Hypertension   . Malignant seminoma of mediastinum ([place]) 1985  . OSA (obstructive sleep apnea) 07/04/2018   Severe obstructive sleep apnea with an AHI of 30/h and mild central sleep apnea at 13.7/h with oxygen desaturations as low as 79%  . S/P TAVR (transcatheter aortic valve replacement) 05/15/2018  29 mm Edwards Sapien 3 transcatheter heart valve placed via percutaneous right transfemoral approach   . Severe aortic stenosis     Past Surgical History:  Procedure Laterality Date  . COLONOSCOPY  07/25/2012   Procedure: COLONOSCOPY;  Surgeon: Winfield Cunas., MD;  Location: [facility] ENDOSCOPY;  Service: Endoscopy;  Laterality: N/A;  . COLONOSCOPY N/A 12/02/2013   Procedure: COLONOSCOPY;  Surgeon: Winfield Cunas., MD;  Location: [facility] ENDOSCOPY;  Service: Endoscopy;  Laterality: N/A;  . CORONARY ARTERY BYPASS GRAFT N/A 01/04/2013   Procedure: CORONARY ARTERY BYPASS GRAFTING (CABG) times two using right  saphenous vein harvested with endoscope.;  Surgeon: Ivin Poot, MD;  Location: [place];  Service: Open Heart Surgery;  Laterality: N/A;  . ESOPHAGOGASTRODUODENOSCOPY  07/20/2012   Procedure: ESOPHAGOGASTRODUODENOSCOPY (EGD);  Surgeon: Winfield Cunas., MD;  Location: [facility] ENDOSCOPY;  Service: Endoscopy;  Laterality: N/A;  . ESOPHAGOGASTRODUODENOSCOPY N/A 12/02/2013   Procedure: ESOPHAGOGASTRODUODENOSCOPY (EGD);  Surgeon: Winfield Cunas., MD;  Location: [facility] ENDOSCOPY;  Service: Endoscopy;  Laterality: N/A;  . HOT HEMOSTASIS N/A 12/02/2013   Procedure: HOT HEMOSTASIS (ARGON PLASMA COAGULATION/BICAP);  Surgeon: Winfield Cunas., MD;  Location: [facility] ENDOSCOPY;  Service: Endoscopy;  Laterality: N/A;  . INTRAOPERATIVE TRANSESOPHAGEAL ECHOCARDIOGRAM N/A 01/04/2013   Procedure: INTRAOPERATIVE TRANSESOPHAGEAL ECHOCARDIOGRAM;  Surgeon: Ivin Poot, MD;  Location: [place];  Service: Open Heart Surgery;  Laterality: N/A;  . INTRAOPERATIVE TRANSTHORACIC ECHOCARDIOGRAM N/A 05/15/2018   Procedure: INTRAOPERATIVE TRANSTHORACIC ECHOCARDIOGRAM;  Surgeon: Burnell Blanks, MD;  Location: [place];  Service: Open Heart Surgery;  Laterality: N/A;  . LEFT HEART CATHETERIZATION WITH CORONARY ANGIOGRAM N/A 03/06/2012   Procedure: LEFT HEART CATHETERIZATION WITH CORONARY ANGIOGRAM;  Surgeon: [name], MD;  Location: [place] CATH LAB;  Service: Cardiovascular;  Laterality: N/A;  . LITHOTRIPSY    . [place]  . RADIAL ARTERY HARVEST Left 01/04/2013   Procedure: RADIAL ARTERY HARVEST;  Surgeon: Ivin Poot, MD;  Location: [place];  Service: Vascular;  Laterality: Left;  Artery not havested. Unsuitable for use.  . resection mediastinal seminonma    . RIGHT/LEFT HEART CATH AND CORONARY/GRAFT ANGIOGRAPHY N/A 11/15/2016   Procedure: Right/Left Heart Cath and Coronary/Graft Angiography;  Surgeon: Leonie Man, MD;  Location: [place] CV LAB;  Service: Cardiovascular;  Laterality: N/A;  . RIGHT/LEFT  HEART CATH AND CORONARY/GRAFT ANGIOGRAPHY N/A 04/13/2018   Procedure: RIGHT/LEFT HEART CATH AND CORONARY/GRAFT ANGIOGRAPHY;  Surgeon: Jolaine Artist, MD;  Location: [place] CV LAB;  Service: Cardiovascular;  Laterality: N/A;  . TRANSCATHETER AORTIC VALVE REPLACEMENT, TRANSFEMORAL  05/15/2018  . TRANSCATHETER AORTIC VALVE REPLACEMENT, TRANSFEMORAL N/A 05/15/2018   Procedure: TRANSCATHETER AORTIC VALVE REPLACEMENT, TRANSFEMORAL;  Surgeon: Burnell Blanks, MD;  Location: [place];  Service: Open Heart Surgery;  Laterality: N/A;    Current Medications: Current Meds  Medication Sig  . amitriptyline (ELAVIL) 100 MG tablet Take 100 mg by mouth daily.   Marland Kitchen amoxicillin (AMOXIL) 500 MG capsule Take 1 capsule (500 mg total) by mouth as directed. Take 4 tablets (2000 mg ) 1 hour prior to dental procedures.  Marland Kitchen aspirin 81 MG chewable tablet Chew 1 tablet (81 mg total) by mouth daily.  . B Complex-C (SUPER B COMPLEX PO) Take 1 tablet by mouth daily.  . B-D ULTRAFINE III SHORT PEN 31G X 8 MM MISC 1 each by Other route daily as needed (GLUCOSE TESTING (INSULINE NEEDLE)).   . Cholecalciferol (VITAMIN D-3) 5000 units TABS Take 5,000  Units by mouth daily.  . clopidogrel (PLAVIX) 75 MG tablet Take 1 tablet (75 mg total) by mouth daily with breakfast.  . HUMALOG KWIKPEN 100 UNIT/ML KiwkPen Per pump;  0.8 unit(s)/per hour average and patient gives a bolus, if needed  . hydroxypropyl methylcellulose / hypromellose (ISOPTO TEARS / GONIOVISC) 2.5 % ophthalmic solution Place 1 drop into both eyes 4 (four) times daily as needed for dry eyes.  Marland Kitchen levothyroxine (SYNTHROID, LEVOTHROID) 50 MCG tablet Take 50 mcg by mouth daily before breakfast. Per Dr. Chalmers Cater - 11/4  Pt instructed to take 1 1/2 every day Monday thru Saturday.  On Sunday take 2 tablets.  . metFORMIN (GLUCOPHAGE) 500 MG tablet Take 2 tablets (1,000 mg total) by mouth 2 (two) times daily with a meal.  . metoprolol succinate (TOPROL-XL) 50 MG 24 hr tablet  Take 75 mg by mouth daily. Take with or immediately following a meal.  . Multiple Vitamin (MULTIVITAMIN) tablet Take 1 tablet by mouth daily.  . ONE TOUCH ULTRA TEST test strip 1 each by Other route as needed for other.   . simvastatin (ZOCOR) 40 MG tablet Take 40 mg by mouth every evening.  . zolpidem (AMBIEN) 10 MG tablet Take 10 mg by mouth daily.      Allergies:   Lipitor [atorvastatin] and Adhesive [tape]   Social History   Socioeconomic History  . Marital status: Married    Spouse name: Not on file  . Number of children: Not on file  . Years of education: Not on file  . Highest education [name]: Not on file  Occupational History  . Not on file  Social Needs  . Financial resource strain: Not on file  . Food insecurity:    Worry: Not on file    Inability: Not on file  . Transportation needs:    Medical: Not on file    Non-medical: Not on file  Tobacco Use  . Smoking status: Former Smoker    Last attempt to quit: 03/06/1985    Years since quitting: 33.3  . Smokeless tobacco: Never Used  Substance and Sexual Activity  . Alcohol use: No  . Drug use: No  . Sexual activity: Not Currently  Lifestyle  . Physical activity:    Days per week: Not on file    Minutes per session: Not on file  . Stress: Not on file  Relationships  . Social connections:    Talks on phone: Not on file    Gets together: Not on file    Attends religious service: Not on file    Active member of club or organization: Not on file    Attends meetings of clubs or organizations: Not on file    Relationship status: Not on file  Other Topics Concern  . Not on file  Social History Narrative  . Not on file     Family History: The patient's family history includes Cancer in his brother; Diabetes in his father, mother, and sister; Heart failure in his mother; Hypertension in his mother.  ROS:   Please see the history of present illness.    ROS  All other systems reviewed and negative.    EKGs/Labs/Other Studies Reviewed:    The following studies were reviewed today: Sleep study  EKG:  EKG is not ordered today.    Recent Labs: 05/11/2018: ALT 55; B Natriuretic Peptide 740.6 05/16/2018: BUN 15; Creatinine, Ser 1.28; Hemoglobin 14.0; Magnesium 1.9; Platelets 165; Potassium 3.8; Sodium 138; TSH 3.157  Recent Lipid Panel    Component Value Date/Time   CHOL 95 04/12/2018 0216   CHOL 104 03/28/2018 0850   TRIG 36 04/12/2018 0216   HDL 33 (L) 04/12/2018 0216   HDL 35 (L) 03/28/2018 0850   CHOLHDL 2.9 04/12/2018 0216   VLDL 7 04/12/2018 0216   LDLCALC 55 04/12/2018 0216   LDLCALC 58 03/28/2018 0850    Physical Exam:    VS:  BP 110/64   Pulse 76   Ht 5\' 11"  (1.803 m)   Wt 167 lb (75.8 kg)   BMI 23.29 kg/m     Wt Readings from Last 3 Encounters:  07/04/18 167 lb (75.8 kg)  06/27/18 165 lb (74.8 kg)  05/28/18 165 lb 6.4 oz (75 kg)     GEN:  Well nourished, well developed in no acute distress HEENT: Normal NECK: No JVD; No carotid bruits LYMPHATICS: No lymphadenopathy CARDIAC: RRR, no murmurs, rubs, gallops RESPIRATORY:  Clear to auscultation without rales, wheezing or rhonchi  ABDOMEN: Soft, non-tender, non-distended MUSCULOSKELETAL:  No edema; No deformity  SKIN: Warm and dry NEUROLOGIC:  Alert and oriented x 3 PSYCHIATRIC:  Normal affect   ASSESSMENT:    1. OSA (obstructive sleep apnea)   2. Essential hypertension   3. Chronic systolic CHF (congestive heart failure) (HCC)    PLAN:    In order of problems listed above:  1.  OSA - recent sleep study showed severe obstructive sleep apnea with an AHI of 30/h and mild central sleep apnea at 13.7/h with oxygen desaturations as low as 79%.  Has been titrated to BiPAP at 18/12 centimeters H2O but is not received his device yet because insurance requires this office visit prior to putting him on BiPAP.  I have encouraged him to try to be compliant when he starts his BiPAP and use at least 5 hours  nightly.  I also instructed him that insurance requires 70% compliance and using more than 4 hours nightly or he will have his device taken away we will have to start the process all over again.  We will go ahead and contact his DME now that his office visit is complete.  I will see him back in 10 weeks to document compliance to make sure he is tolerating his device.  2.  HTN - BP is well controlled on exam today.  He will continue on Toprol-XL 75 mg daily.  3.  Chronic systolic CHF - his last echo on 06/27/2018 status post TAVR showed an EF of 30 to 35%.  He will continue on Toprol-XL 75 mg daily.   Medication Adjustments/Labs and Tests Ordered: Current medicines are reviewed at length with the patient today.  Concerns regarding medicines are outlined above.  No orders of the defined types were placed in this encounter.  No orders of the defined types were placed in this encounter.   Signed, Fransico Him, MD  07/04/2018 1:04 PM    [place]

## 2018-07-04 NOTE — Patient Instructions (Signed)
Medication Instructions:  Your physician recommends that you continue on your current medications as directed. Please refer to the Current Medication list given to you today.  If you need a refill on your cardiac medications before your next appointment, please call your pharmacy.   Lab work:  If you have labs (blood work) drawn today and your tests are completely normal, you will receive your results only by: Marland Kitchen MyChart Message (if you have MyChart) OR . A paper copy in the mail If you have any lab test that is abnormal or we need to change your treatment, we will call you to review the results.  Follow-Up: 10 weeks after CPAP device is received

## 2018-07-05 ENCOUNTER — Encounter (HOSPITAL_COMMUNITY)
Admission: RE | Admit: 2018-07-05 | Discharge: 2018-07-05 | Disposition: A | Discharge status: Home / Self Care | Payer: Self-pay | Source: Ambulatory Visit | Attending: Cardiology | Admitting: Cardiology

## 2018-07-05 ENCOUNTER — Encounter (HOSPITAL_COMMUNITY): Payer: Self-pay

## 2018-07-06 ENCOUNTER — Encounter (HOSPITAL_COMMUNITY)
Admission: RE | Admit: 2018-07-06 | Discharge: 2018-07-06 | Disposition: A | Discharge status: Home / Self Care | Payer: Self-pay | Source: Ambulatory Visit | Attending: Cardiology | Admitting: Cardiology

## 2018-07-06 ENCOUNTER — Encounter (HOSPITAL_COMMUNITY): Payer: Self-pay

## 2018-07-10 ENCOUNTER — Encounter (HOSPITAL_COMMUNITY): Payer: Self-pay

## 2018-07-10 ENCOUNTER — Encounter (HOSPITAL_COMMUNITY)
Admission: RE | Admit: 2018-07-10 | Discharge: 2018-07-10 | Disposition: A | Discharge status: Home / Self Care | Payer: Self-pay | Source: Ambulatory Visit | Attending: Cardiology | Admitting: Cardiology

## 2018-07-17 ENCOUNTER — Encounter (HOSPITAL_COMMUNITY): Payer: Self-pay

## 2018-07-17 DIAGNOSIS — G4733 Obstructive sleep apnea (adult) (pediatric): Secondary | ICD-10-CM | POA: Insufficient documentation

## 2018-07-17 DIAGNOSIS — I2510 Atherosclerotic heart disease of native coronary artery without angina pectoris: Secondary | ICD-10-CM | POA: Insufficient documentation

## 2018-07-17 DIAGNOSIS — E119 Type 2 diabetes mellitus without complications: Secondary | ICD-10-CM | POA: Insufficient documentation

## 2018-07-17 DIAGNOSIS — I5022 Chronic systolic (congestive) heart failure: Secondary | ICD-10-CM | POA: Insufficient documentation

## 2018-07-17 DIAGNOSIS — Z9889 Other specified postprocedural states: Secondary | ICD-10-CM | POA: Insufficient documentation

## 2018-07-17 DIAGNOSIS — Z87891 Personal history of nicotine dependence: Secondary | ICD-10-CM | POA: Insufficient documentation

## 2018-07-17 DIAGNOSIS — Z951 Presence of aortocoronary bypass graft: Secondary | ICD-10-CM | POA: Insufficient documentation

## 2018-07-17 DIAGNOSIS — I110 Hypertensive heart disease with heart failure: Secondary | ICD-10-CM | POA: Insufficient documentation

## 2018-07-19 ENCOUNTER — Encounter: Payer: Self-pay | Admitting: Thoracic Surgery (Cardiothoracic Vascular Surgery)

## 2018-07-19 ENCOUNTER — Encounter (HOSPITAL_COMMUNITY)
Admission: RE | Admit: 2018-07-19 | Discharge: 2018-07-19 | Disposition: A | Discharge status: Home / Self Care | Payer: Self-pay | Source: Ambulatory Visit | Attending: Cardiology | Admitting: Cardiology

## 2018-07-19 ENCOUNTER — Encounter (HOSPITAL_COMMUNITY): Payer: Self-pay

## 2018-07-20 ENCOUNTER — Encounter (HOSPITAL_COMMUNITY): Payer: Self-pay

## 2018-07-20 ENCOUNTER — Encounter (HOSPITAL_COMMUNITY)
Admission: RE | Admit: 2018-07-20 | Discharge: 2018-07-20 | Disposition: A | Discharge status: Home / Self Care | Payer: Self-pay | Source: Ambulatory Visit | Attending: Cardiology | Admitting: Cardiology

## 2018-07-24 ENCOUNTER — Encounter (HOSPITAL_COMMUNITY): Payer: Self-pay

## 2018-07-24 ENCOUNTER — Encounter (HOSPITAL_COMMUNITY)
Admission: RE | Admit: 2018-07-24 | Discharge: 2018-07-24 | Disposition: A | Discharge status: Home / Self Care | Payer: Medicare Other | Source: Ambulatory Visit | Attending: Cardiology | Admitting: Cardiology

## 2018-07-26 ENCOUNTER — Telehealth: Payer: Self-pay | Admitting: *Deleted

## 2018-07-26 ENCOUNTER — Encounter (HOSPITAL_COMMUNITY): Payer: Self-pay

## 2018-07-26 ENCOUNTER — Encounter (HOSPITAL_COMMUNITY)
Admission: RE | Admit: 2018-07-26 | Discharge: 2018-07-26 | Disposition: A | Discharge status: Home / Self Care | Payer: Self-pay | Source: Ambulatory Visit | Attending: Cardiology | Admitting: Cardiology

## 2018-07-26 NOTE — Telephone Encounter (Signed)
  Bill Margarita, MD  Freada Bergeron, CMA        OK to do [name]     ----- Message -----  From: Freada Bergeron, CMA  Sent: 07/26/2018 12:43 PM EST  To: Bill Margarita, MD   Dr Radford Pax Per Anderson Malta at [facility] you sure you want this Respironics machine? [name] has trouble with downloads and Anderson Malta wasn't sure because this is your first order of respironics and Anderson Malta wasn't sure if this was really what you wanted before she orders it. Anderson Malta is waiting on a response from me before ordering.Thanks, [name]   This is the order:   Respironics BiPAP therapy on 16/12 cm H2O with a Medium size [employer] Full Face Mask Simplus mask and heated humidification   If this is what you want that is what Anderson Malta will order

## 2018-07-27 ENCOUNTER — Encounter (HOSPITAL_COMMUNITY)
Admission: RE | Admit: 2018-07-27 | Discharge: 2018-07-27 | Disposition: A | Discharge status: Home / Self Care | Payer: Self-pay | Source: Ambulatory Visit | Attending: Cardiology | Admitting: Cardiology

## 2018-07-27 ENCOUNTER — Encounter (HOSPITAL_COMMUNITY): Payer: Self-pay

## 2018-07-27 DIAGNOSIS — Z953 Presence of xenogenic heart valve: Secondary | ICD-10-CM | POA: Diagnosis not present

## 2018-07-27 DIAGNOSIS — G4700 Insomnia, unspecified: Secondary | ICD-10-CM | POA: Diagnosis not present

## 2018-07-27 DIAGNOSIS — E039 Hypothyroidism, unspecified: Secondary | ICD-10-CM | POA: Diagnosis not present

## 2018-07-27 DIAGNOSIS — I10 Essential (primary) hypertension: Secondary | ICD-10-CM | POA: Diagnosis not present

## 2018-07-27 DIAGNOSIS — N183 Chronic kidney disease, stage 3 (moderate): Secondary | ICD-10-CM | POA: Diagnosis not present

## 2018-07-27 DIAGNOSIS — D500 Iron deficiency anemia secondary to blood loss (chronic): Secondary | ICD-10-CM | POA: Diagnosis not present

## 2018-07-31 ENCOUNTER — Encounter (HOSPITAL_COMMUNITY): Payer: Self-pay

## 2018-08-02 ENCOUNTER — Encounter (HOSPITAL_COMMUNITY): Payer: Self-pay

## 2018-08-03 ENCOUNTER — Encounter (HOSPITAL_COMMUNITY)
Admission: RE | Admit: 2018-08-03 | Discharge: 2018-08-03 | Disposition: A | Discharge status: Home / Self Care | Payer: Self-pay | Source: Ambulatory Visit | Attending: Cardiology | Admitting: Cardiology

## 2018-08-03 ENCOUNTER — Encounter (HOSPITAL_COMMUNITY): Payer: Self-pay

## 2018-08-07 ENCOUNTER — Encounter (HOSPITAL_COMMUNITY): Payer: Self-pay

## 2018-08-09 ENCOUNTER — Encounter (HOSPITAL_COMMUNITY): Payer: Self-pay

## 2018-08-09 ENCOUNTER — Encounter (HOSPITAL_COMMUNITY)
Admission: RE | Admit: 2018-08-09 | Discharge: 2018-08-09 | Disposition: A | Discharge status: Home / Self Care | Payer: Self-pay | Source: Ambulatory Visit | Attending: Cardiology | Admitting: Cardiology

## 2018-08-10 ENCOUNTER — Encounter (HOSPITAL_COMMUNITY): Payer: Self-pay

## 2018-08-10 ENCOUNTER — Encounter (HOSPITAL_COMMUNITY)
Admission: RE | Admit: 2018-08-10 | Discharge: 2018-08-10 | Disposition: A | Discharge status: Home / Self Care | Payer: Self-pay | Source: Ambulatory Visit | Attending: Cardiology | Admitting: Cardiology

## 2018-08-14 ENCOUNTER — Encounter (HOSPITAL_COMMUNITY): Payer: Self-pay

## 2018-08-14 ENCOUNTER — Encounter (HOSPITAL_COMMUNITY)
Admission: RE | Admit: 2018-08-14 | Discharge: 2018-08-14 | Disposition: A | Discharge status: Home / Self Care | Payer: Self-pay | Source: Ambulatory Visit | Attending: Cardiology | Admitting: Cardiology

## 2018-08-16 ENCOUNTER — Encounter (HOSPITAL_COMMUNITY)
Admission: RE | Admit: 2018-08-16 | Discharge: 2018-08-16 | Disposition: A | Discharge status: Home / Self Care | Payer: Medicare Other | Source: Ambulatory Visit | Attending: Cardiology | Admitting: Cardiology

## 2018-08-16 ENCOUNTER — Encounter (HOSPITAL_COMMUNITY): Payer: Self-pay

## 2018-08-16 DIAGNOSIS — Z951 Presence of aortocoronary bypass graft: Secondary | ICD-10-CM | POA: Insufficient documentation

## 2018-08-16 DIAGNOSIS — I110 Hypertensive heart disease with heart failure: Secondary | ICD-10-CM | POA: Insufficient documentation

## 2018-08-16 DIAGNOSIS — I2510 Atherosclerotic heart disease of native coronary artery without angina pectoris: Secondary | ICD-10-CM | POA: Insufficient documentation

## 2018-08-16 DIAGNOSIS — E119 Type 2 diabetes mellitus without complications: Secondary | ICD-10-CM | POA: Insufficient documentation

## 2018-08-16 DIAGNOSIS — Z9889 Other specified postprocedural states: Secondary | ICD-10-CM | POA: Insufficient documentation

## 2018-08-16 DIAGNOSIS — G4733 Obstructive sleep apnea (adult) (pediatric): Secondary | ICD-10-CM | POA: Insufficient documentation

## 2018-08-16 DIAGNOSIS — Z87891 Personal history of nicotine dependence: Secondary | ICD-10-CM | POA: Insufficient documentation

## 2018-08-16 DIAGNOSIS — I5022 Chronic systolic (congestive) heart failure: Secondary | ICD-10-CM | POA: Insufficient documentation

## 2018-08-17 ENCOUNTER — Encounter (HOSPITAL_COMMUNITY): Payer: Self-pay

## 2018-08-17 ENCOUNTER — Encounter (HOSPITAL_COMMUNITY)
Admission: RE | Admit: 2018-08-17 | Discharge: 2018-08-17 | Disposition: A | Discharge status: Home / Self Care | Payer: Self-pay | Source: Ambulatory Visit | Attending: Cardiology | Admitting: Cardiology

## 2018-08-17 ENCOUNTER — Telehealth: Payer: Self-pay | Admitting: Cardiology

## 2018-08-17 NOTE — Telephone Encounter (Signed)
New message     Pt is on cpap . Wife stated he is having  multiple problems and she wants an appt asap .

## 2018-08-17 NOTE — Telephone Encounter (Signed)
Patient has been taking his mask off in his sleep and has dry mouth. Patients wife would like an appointment asap. Patients wife understands the visit has to be after 31 days but before 91 days. Wife states the patient got set up on December 27th and she wants an appointment late January or early February which will be 33 days and would like a call back to schedule that appointment.

## 2018-08-21 ENCOUNTER — Encounter (HOSPITAL_COMMUNITY): Payer: Self-pay

## 2018-08-21 ENCOUNTER — Encounter (HOSPITAL_COMMUNITY)
Admission: RE | Admit: 2018-08-21 | Discharge: 2018-08-21 | Disposition: A | Discharge status: Home / Self Care | Payer: Self-pay | Source: Ambulatory Visit | Attending: Cardiology | Admitting: Cardiology

## 2018-08-23 ENCOUNTER — Encounter (HOSPITAL_COMMUNITY): Payer: Self-pay

## 2018-08-24 ENCOUNTER — Encounter (HOSPITAL_COMMUNITY): Payer: Self-pay

## 2018-08-24 ENCOUNTER — Encounter (HOSPITAL_COMMUNITY)
Admission: RE | Admit: 2018-08-24 | Discharge: 2018-08-24 | Disposition: A | Discharge status: Home / Self Care | Payer: Self-pay | Source: Ambulatory Visit | Attending: Cardiology | Admitting: Cardiology

## 2018-08-28 ENCOUNTER — Encounter (HOSPITAL_COMMUNITY): Payer: Self-pay

## 2018-08-28 ENCOUNTER — Encounter (HOSPITAL_COMMUNITY)
Admission: RE | Admit: 2018-08-28 | Discharge: 2018-08-28 | Disposition: A | Discharge status: Home / Self Care | Payer: Self-pay | Source: Ambulatory Visit | Attending: Cardiology | Admitting: Cardiology

## 2018-08-30 ENCOUNTER — Encounter (HOSPITAL_COMMUNITY): Payer: Self-pay

## 2018-08-30 ENCOUNTER — Encounter (HOSPITAL_COMMUNITY)
Admission: RE | Admit: 2018-08-30 | Discharge: 2018-08-30 | Disposition: A | Discharge status: Home / Self Care | Payer: Self-pay | Source: Ambulatory Visit | Attending: Cardiology | Admitting: Cardiology

## 2018-08-31 ENCOUNTER — Encounter (HOSPITAL_COMMUNITY)
Admission: RE | Admit: 2018-08-31 | Discharge: 2018-08-31 | Disposition: A | Discharge status: Home / Self Care | Payer: Self-pay | Source: Ambulatory Visit | Attending: Cardiology | Admitting: Cardiology

## 2018-08-31 ENCOUNTER — Encounter (HOSPITAL_COMMUNITY): Payer: Self-pay

## 2018-09-04 ENCOUNTER — Encounter (HOSPITAL_COMMUNITY): Payer: Self-pay

## 2018-09-04 ENCOUNTER — Encounter (HOSPITAL_COMMUNITY)
Admission: RE | Admit: 2018-09-04 | Discharge: 2018-09-04 | Disposition: A | Discharge status: Home / Self Care | Payer: Self-pay | Source: Ambulatory Visit | Attending: Cardiology | Admitting: Cardiology

## 2018-09-06 ENCOUNTER — Encounter (HOSPITAL_COMMUNITY)
Admission: RE | Admit: 2018-09-06 | Discharge: 2018-09-06 | Disposition: A | Discharge status: Home / Self Care | Payer: Self-pay | Source: Ambulatory Visit | Attending: Cardiology | Admitting: Cardiology

## 2018-09-06 ENCOUNTER — Encounter (HOSPITAL_COMMUNITY): Payer: Self-pay

## 2018-09-07 ENCOUNTER — Encounter (HOSPITAL_COMMUNITY): Payer: Self-pay

## 2018-09-11 ENCOUNTER — Encounter (HOSPITAL_COMMUNITY): Payer: Self-pay

## 2018-09-13 ENCOUNTER — Encounter (HOSPITAL_COMMUNITY): Payer: Self-pay

## 2018-09-13 ENCOUNTER — Encounter (HOSPITAL_COMMUNITY)
Admission: RE | Admit: 2018-09-13 | Discharge: 2018-09-13 | Disposition: A | Discharge status: Home / Self Care | Payer: Self-pay | Source: Ambulatory Visit | Attending: Cardiology | Admitting: Cardiology

## 2018-09-13 DIAGNOSIS — I2510 Atherosclerotic heart disease of native coronary artery without angina pectoris: Secondary | ICD-10-CM | POA: Diagnosis not present

## 2018-09-13 DIAGNOSIS — Z79899 Other long term (current) drug therapy: Secondary | ICD-10-CM | POA: Diagnosis not present

## 2018-09-13 DIAGNOSIS — R252 Cramp and spasm: Secondary | ICD-10-CM | POA: Diagnosis not present

## 2018-09-14 ENCOUNTER — Encounter (HOSPITAL_COMMUNITY): Payer: Self-pay

## 2018-09-14 ENCOUNTER — Encounter (HOSPITAL_COMMUNITY)
Admission: RE | Admit: 2018-09-14 | Discharge: 2018-09-14 | Disposition: A | Discharge status: Home / Self Care | Payer: Self-pay | Source: Ambulatory Visit | Attending: Cardiology | Admitting: Cardiology

## 2018-09-18 ENCOUNTER — Encounter (HOSPITAL_COMMUNITY)
Admission: RE | Admit: 2018-09-18 | Discharge: 2018-09-18 | Disposition: A | Discharge status: Home / Self Care | Payer: Self-pay | Source: Ambulatory Visit | Attending: Cardiology | Admitting: Cardiology

## 2018-09-18 DIAGNOSIS — I110 Hypertensive heart disease with heart failure: Secondary | ICD-10-CM | POA: Insufficient documentation

## 2018-09-18 DIAGNOSIS — G4733 Obstructive sleep apnea (adult) (pediatric): Secondary | ICD-10-CM | POA: Insufficient documentation

## 2018-09-18 DIAGNOSIS — I5022 Chronic systolic (congestive) heart failure: Secondary | ICD-10-CM | POA: Insufficient documentation

## 2018-09-18 DIAGNOSIS — E119 Type 2 diabetes mellitus without complications: Secondary | ICD-10-CM | POA: Insufficient documentation

## 2018-09-18 DIAGNOSIS — Z951 Presence of aortocoronary bypass graft: Secondary | ICD-10-CM | POA: Insufficient documentation

## 2018-09-18 DIAGNOSIS — I2510 Atherosclerotic heart disease of native coronary artery without angina pectoris: Secondary | ICD-10-CM | POA: Insufficient documentation

## 2018-09-18 DIAGNOSIS — Z87891 Personal history of nicotine dependence: Secondary | ICD-10-CM | POA: Insufficient documentation

## 2018-09-18 DIAGNOSIS — Z9889 Other specified postprocedural states: Secondary | ICD-10-CM | POA: Insufficient documentation

## 2018-09-20 ENCOUNTER — Encounter (HOSPITAL_COMMUNITY)
Admission: RE | Admit: 2018-09-20 | Discharge: 2018-09-20 | Disposition: A | Discharge status: Home / Self Care | Payer: Self-pay | Source: Ambulatory Visit | Attending: Cardiology | Admitting: Cardiology

## 2018-09-21 ENCOUNTER — Encounter (HOSPITAL_COMMUNITY)
Admission: RE | Admit: 2018-09-21 | Discharge: 2018-09-21 | Disposition: A | Discharge status: Home / Self Care | Payer: Self-pay | Source: Ambulatory Visit | Attending: Cardiology | Admitting: Cardiology

## 2018-09-25 ENCOUNTER — Encounter (HOSPITAL_COMMUNITY): Payer: Self-pay

## 2018-09-27 ENCOUNTER — Encounter (HOSPITAL_COMMUNITY)
Admission: RE | Admit: 2018-09-27 | Discharge: 2018-09-27 | Disposition: A | Discharge status: Home / Self Care | Payer: Self-pay | Source: Ambulatory Visit | Attending: Cardiology | Admitting: Cardiology

## 2018-09-28 ENCOUNTER — Encounter (HOSPITAL_COMMUNITY)
Admission: RE | Admit: 2018-09-28 | Discharge: 2018-09-28 | Disposition: A | Discharge status: Home / Self Care | Payer: Self-pay | Source: Ambulatory Visit | Attending: Cardiology | Admitting: Cardiology

## 2018-10-02 ENCOUNTER — Encounter (HOSPITAL_COMMUNITY)
Admission: RE | Admit: 2018-10-02 | Discharge: 2018-10-02 | Disposition: A | Discharge status: Home / Self Care | Payer: Self-pay | Source: Ambulatory Visit | Attending: Cardiology | Admitting: Cardiology

## 2018-10-03 ENCOUNTER — Telehealth: Payer: Self-pay | Admitting: Cardiology

## 2018-10-03 ENCOUNTER — Ambulatory Visit (INDEPENDENT_AMBULATORY_CARE_PROVIDER_SITE_OTHER)
Admission: RE | Admit: 2018-10-03 | Discharge: 2018-10-03 | Disposition: A | Discharge status: Home / Self Care | Payer: Medicare Other | Source: Ambulatory Visit | Attending: Physician Assistant | Admitting: Physician Assistant

## 2018-10-03 DIAGNOSIS — R911 Solitary pulmonary nodule: Secondary | ICD-10-CM | POA: Diagnosis not present

## 2018-10-03 DIAGNOSIS — J9811 Atelectasis: Secondary | ICD-10-CM

## 2018-10-03 NOTE — Telephone Encounter (Signed)
New Message   [name] is calling with CT results. Please call back

## 2018-10-03 NOTE — Telephone Encounter (Signed)
Looks like [place], [place] ordered CT. I will send message to Lenice Llamas, RN for [place], [place].

## 2018-10-03 NOTE — Telephone Encounter (Signed)
Informed the patient's wife the results have not yet been reviewed by his provider. Informed her he will be called when it is.  She was grateful for follow-up.

## 2018-10-04 ENCOUNTER — Encounter (HOSPITAL_COMMUNITY)
Admission: RE | Admit: 2018-10-04 | Discharge: 2018-10-04 | Disposition: A | Discharge status: Home / Self Care | Payer: Self-pay | Source: Ambulatory Visit | Attending: Cardiology | Admitting: Cardiology

## 2018-10-05 ENCOUNTER — Telehealth: Payer: Self-pay

## 2018-10-05 ENCOUNTER — Encounter (HOSPITAL_COMMUNITY)
Admission: RE | Admit: 2018-10-05 | Discharge: 2018-10-05 | Disposition: A | Discharge status: Home / Self Care | Payer: Self-pay | Source: Ambulatory Visit | Attending: Cardiology | Admitting: Cardiology

## 2018-10-05 DIAGNOSIS — J181 Lobar pneumonia, unspecified organism: Secondary | ICD-10-CM

## 2018-10-05 DIAGNOSIS — R911 Solitary pulmonary nodule: Secondary | ICD-10-CM

## 2018-10-05 NOTE — Telephone Encounter (Signed)
-----   Message from [name], MD sent at 10/05/2018 12:15 PM EST ----- Please make sure this gets done  Traci ----- Message ----- From: Eileen Stanford, PA-C Sent: 10/05/2018  11:22 AM EST To: [name], MD, Burnell Blanks, MD, #  CT scan shows stable right pulm nodule, but there is an enlarging irregular masslike focus of consolidation in the medial left lower lobe measuring 3.5 x 2.2 cm. PET-CT scan recommended to r/o primary bronchogenic carcinoma.   Also, he has small bilateral pleural effusions. Wife said he sometimes seems to be retaining fluid and is lethargic. EF only ~30% and he is not on a diuretic. I has asked her if she would like me to call in a diuretic to take as needed and we can get him and appt to see Dr. Radford Pax or an APP. She would like to wait until after we get PET scan before changing anything.   Valetta Fuller, can you help me get a PET CT set up for this patient. I already discussed results with his wife and she is expecting a phone call to get scan set up. Thank you!

## 2018-10-05 NOTE — Telephone Encounter (Signed)
PET scan scheduled 3/4. DPR understands the patient will need to be at [facility] at [place]. She understands he will have no food or drink for 6 hours prior to the test.  Scheduled the patient 2/25 with Dr. Radford Pax for evaluation of fluid status.  DPR was grateful for call and agrees with treatment plan.

## 2018-10-09 ENCOUNTER — Other Ambulatory Visit: Payer: Self-pay | Admitting: Cardiology

## 2018-10-09 ENCOUNTER — Encounter: Payer: Self-pay | Admitting: Cardiology

## 2018-10-09 ENCOUNTER — Encounter (HOSPITAL_COMMUNITY)
Admission: RE | Admit: 2018-10-09 | Discharge: 2018-10-09 | Disposition: A | Discharge status: Home / Self Care | Payer: Self-pay | Source: Ambulatory Visit | Attending: Cardiology | Admitting: Cardiology

## 2018-10-09 ENCOUNTER — Ambulatory Visit (INDEPENDENT_AMBULATORY_CARE_PROVIDER_SITE_OTHER): Payer: Medicare Other | Admitting: Cardiology

## 2018-10-09 VITALS — BP 122/73 | HR 89 | Ht 71.0 in | Wt 165.4 lb

## 2018-10-09 DIAGNOSIS — I5022 Chronic systolic (congestive) heart failure: Secondary | ICD-10-CM

## 2018-10-09 DIAGNOSIS — I10 Essential (primary) hypertension: Secondary | ICD-10-CM

## 2018-10-09 DIAGNOSIS — E785 Hyperlipidemia, unspecified: Secondary | ICD-10-CM

## 2018-10-09 DIAGNOSIS — I350 Nonrheumatic aortic (valve) stenosis: Secondary | ICD-10-CM

## 2018-10-09 DIAGNOSIS — G4733 Obstructive sleep apnea (adult) (pediatric): Secondary | ICD-10-CM

## 2018-10-09 DIAGNOSIS — I2510 Atherosclerotic heart disease of native coronary artery without angina pectoris: Secondary | ICD-10-CM | POA: Diagnosis not present

## 2018-10-09 MED ORDER — LOSARTAN POTASSIUM 25 MG PO TABS: 25.0000 mg | ORAL_TABLET | Freq: Every day | ORAL | 0 refills | Status: DC

## 2018-10-09 NOTE — Progress Notes (Signed)
Cardiology Office Note:    Date:  10/09/2018   ID:  [name], DOB 08-16-1950, MRN [id number]  PCP:  [name], MD  Cardiologist:  Fransico Him, MD    Referring MD: [name], MD   Chief Complaint  Patient presents with  . Sleep Apnea  . Coronary Artery Disease  . Aortic Stenosis  . Congestive Heart Failure  . Hyperlipidemia    History of Present Illness:    [name] is a 68 y.o. male with a hx of CAD, aortic stenosis and systolic HF. Hehad anon-STEMI in 2013 treated with PCI of the LCx/OM. LHC in 5/14 demonstrated severe 2 vessel CAD with critical in-stent restenosis in the LCx. He underwent CABG (SVG-PDA, SVG-LCx). He also has a history of aortic stenosis. Echo 10/2016 showed moderately reduced LVF at 35-40% with diffuse HK, mild AS and moderate AR. Right and Left heart cath showed mild to moderate AS with AVA 0.837cm2 and EF 45% with patent SVG to RPDA and SVG to OM1 With occluded native vessels.   He ultimately was diagHenosed with low gradient aortic stenosis with AI and underwent TAVR with a 29 mm Edwards Sapein 3 THV via transfemoral approach after being admitted with acute exacerbation of CHF on 05/15/2018.    He also was recently dx with severe obstructive sleep apnea with an AHI of 30/h and mild central sleep apnea at 13.7/h with oxygen desaturations as low as 79%.  He underwent CPAP titration but was not successful due to ongoing respiratory events and was ultimately ordered on BiPAP therapy at 16/12 centimeters H2O.  He is doing well with his CPAP device and thinks that h has gotten used to it.  He tolerates the mask and feels the pressure is adequate.  Since going on CPAP he feels rested in the am and has no significant daytime sleepiness.  He denies any significant mouth or nasal dryness or nasal congestion.  he does not think that he snores.  He denies any chest pain or pressure, SOB, DOE, PND, orthopnea, LE edema, dizziness, palpitations or syncope. He is  compliant with his meds and is tolerating meds with no SE.      Past Medical History:  Diagnosis Date  . Acute on chronic combined systolic and diastolic CHF (congestive heart failure) ([place]) 04/16/2018  . Anemia   . Arthritis   . Chronic kidney disease   . Chronic systolic CHF (congestive heart failure) ([place]) 07/04/2018  . Coronary artery disease 02/2012   a. s/p stenting in 2013  b. s/p CABGx2V (SVG--> PDA, SVG--> LCx).  . Diabetes mellitus    neuropathy  insulin dependent  . Dyslipidemia   . Erectile dysfunction   . GERD (gastroesophageal reflux disease)   . History of CVA (cerebrovascular accident)   . History of kidney stones   . Hx of radiation therapy to mediastinum 1985  . Hypertension   . 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 OSA (obstructive sleep apnea) 07/04/2018   Severe obstructive sleep apnea with an AHI of 30/h and mild central sleep apnea at 13.7/h with oxygen desaturations as low as 79%  . S/P TAVR (transcatheter aortic valve replacement) 05/15/2018   29 mm Edwards Sapien 3 transcatheter heart valve placed via percutaneous right transfemoral approach   . Severe aortic stenosis     Past Surgical History:  Procedure Laterality Date  . COLONOSCOPY  07/25/2012   Procedure: COLONOSCOPY;  Surgeon: Winfield Cunas., MD;  Location: [facility] ENDOSCOPY;  Service: Endoscopy;  Laterality: N/A;  . COLONOSCOPY N/A 12/02/2013   Procedure: COLONOSCOPY;  Surgeon: Winfield Cunas., MD;  Location: [facility] ENDOSCOPY;  Service: Endoscopy;  Laterality: N/A;  . CORONARY ARTERY BYPASS GRAFT N/A 01/04/2013   Procedure: CORONARY ARTERY BYPASS GRAFTING (CABG) times two using right saphenous vein harvested with endoscope.;  Surgeon: Ivin Poot, MD;  Location: [place];  Service: Open Heart Surgery;  Laterality: N/A;  . ESOPHAGOGASTRODUODENOSCOPY  07/20/2012   Procedure: ESOPHAGOGASTRODUODENOSCOPY (EGD);  Surgeon: Winfield Cunas., MD;  Location: [facility] ENDOSCOPY;  Service: Endoscopy;   Laterality: N/A;  . ESOPHAGOGASTRODUODENOSCOPY N/A 12/02/2013   Procedure: ESOPHAGOGASTRODUODENOSCOPY (EGD);  Surgeon: Winfield Cunas., MD;  Location: [facility] ENDOSCOPY;  Service: Endoscopy;  Laterality: N/A;  . HOT HEMOSTASIS N/A 12/02/2013   Procedure: HOT HEMOSTASIS (ARGON PLASMA COAGULATION/BICAP);  Surgeon: Winfield Cunas., MD;  Location: [facility] ENDOSCOPY;  Service: Endoscopy;  Laterality: N/A;  . INTRAOPERATIVE TRANSESOPHAGEAL ECHOCARDIOGRAM N/A 01/04/2013   Procedure: INTRAOPERATIVE TRANSESOPHAGEAL ECHOCARDIOGRAM;  Surgeon: Ivin Poot, MD;  Location: [place];  Service: Open Heart Surgery;  Laterality: N/A;  . INTRAOPERATIVE TRANSTHORACIC ECHOCARDIOGRAM N/A 05/15/2018   Procedure: INTRAOPERATIVE TRANSTHORACIC ECHOCARDIOGRAM;  Surgeon: Burnell Blanks, MD;  Location: [place];  Service: Open Heart Surgery;  Laterality: N/A;  . LEFT HEART CATHETERIZATION WITH CORONARY ANGIOGRAM N/A 03/06/2012   Procedure: LEFT HEART CATHETERIZATION WITH CORONARY ANGIOGRAM;  Surgeon: [name], MD;  Location: [place] CATH LAB;  Service: Cardiovascular;  Laterality: N/A;  . LITHOTRIPSY    . [place]  . RADIAL ARTERY HARVEST Left 01/04/2013   Procedure: RADIAL ARTERY HARVEST;  Surgeon: Ivin Poot, MD;  Location: [place];  Service: Vascular;  Laterality: Left;  Artery not havested. Unsuitable for use.  . resection mediastinal seminonma    . RIGHT/LEFT HEART CATH AND CORONARY/GRAFT ANGIOGRAPHY N/A 11/15/2016   Procedure: Right/Left Heart Cath and Coronary/Graft Angiography;  Surgeon: Leonie Man, MD;  Location: [place] CV LAB;  Service: Cardiovascular;  Laterality: N/A;  . RIGHT/LEFT HEART CATH AND CORONARY/GRAFT ANGIOGRAPHY N/A 04/13/2018   Procedure: RIGHT/LEFT HEART CATH AND CORONARY/GRAFT ANGIOGRAPHY;  Surgeon: Jolaine Artist, MD;  Location: [place] CV LAB;  Service: Cardiovascular;  Laterality: N/A;  . TRANSCATHETER AORTIC VALVE REPLACEMENT, TRANSFEMORAL  05/15/2018  .  TRANSCATHETER AORTIC VALVE REPLACEMENT, TRANSFEMORAL N/A 05/15/2018   Procedure: TRANSCATHETER AORTIC VALVE REPLACEMENT, TRANSFEMORAL;  Surgeon: Burnell Blanks, MD;  Location: [place];  Service: Open Heart Surgery;  Laterality: N/A;    Current Medications: Current Meds  Medication Sig  . amitriptyline (ELAVIL) 100 MG tablet Take 100 mg by mouth daily.   Marland Kitchen amoxicillin (AMOXIL) 500 MG capsule Take 1 capsule (500 mg total) by mouth as directed. Take 4 tablets (2000 mg ) 1 hour prior to dental procedures.  Marland Kitchen aspirin 81 MG chewable tablet Chew 1 tablet (81 mg total) by mouth daily.  . B Complex-C (SUPER B COMPLEX PO) Take 1 tablet by mouth daily.  . B-D ULTRAFINE III SHORT PEN 31G X 8 MM MISC 1 each by Other route daily as needed (GLUCOSE TESTING (INSULINE NEEDLE)).   . Cholecalciferol (VITAMIN D-3) 5000 units TABS Take 5,000 Units by mouth daily.  . clopidogrel (PLAVIX) 75 MG tablet Take 1 tablet (75 mg total) by mouth daily with breakfast.  . HUMALOG KWIKPEN 100 UNIT/ML KiwkPen Per pump;  0.8 unit(s)/per hour average and patient gives a bolus, if needed  . hydroxypropyl methylcellulose / hypromellose (ISOPTO TEARS /  GONIOVISC) 2.5 % ophthalmic solution Place 1 drop into both eyes 4 (four) times daily as needed for dry eyes.  Marland Kitchen levothyroxine (SYNTHROID, LEVOTHROID) 50 MCG tablet Take 50 mcg by mouth daily before breakfast. Per Dr. Chalmers Cater - 11/4  Pt instructed to take 1 1/2 every day Monday thru Saturday.  On Sunday take 2 tablets.  . metFORMIN (GLUCOPHAGE) 500 MG tablet Take 2 tablets (1,000 mg total) by mouth 2 (two) times daily with a meal.  . metoprolol succinate (TOPROL-XL) 50 MG 24 hr tablet Take 75 mg by mouth daily. Take with or immediately following a meal.  . Multiple Vitamin (MULTIVITAMIN) tablet Take 1 tablet by mouth daily.  . ONE TOUCH ULTRA TEST test strip 1 each by Other route as needed for other.   . simvastatin (ZOCOR) 40 MG tablet Take 40 mg by mouth every evening.  .  zolpidem (AMBIEN) 10 MG tablet Take 10 mg by mouth daily.      Allergies:   Lipitor [atorvastatin] and Adhesive [tape]   Social History   Socioeconomic History  . Marital status: Married    Spouse name: Not on file  . Number of children: Not on file  . Years of education: Not on file  . Highest education level: Not on file  Occupational History  . Not on file  Social Needs  . Financial resource strain: Not on file  . Food insecurity:    Worry: Not on file    Inability: Not on file  . Transportation needs:    Medical: Not on file    Non-medical: Not on file  Tobacco Use  . Smoking status: Former Smoker    Last attempt to quit: 03/06/1985    Years since quitting: 33.6  . Smokeless tobacco: Never Used  Substance and Sexual Activity  . Alcohol use: No  . Drug use: No  . Sexual activity: Not Currently  Lifestyle  . Physical activity:    Days per week: Not on file    Minutes per session: Not on file  . Stress: Not on file  Relationships  . Social connections:    Talks on phone: Not on file    Gets together: Not on file    Attends religious service: Not on file    Active member of club or organization: Not on file    Attends meetings of clubs or organizations: Not on file    Relationship status: Not on file  Other Topics Concern  . Not on file  Social History Narrative  . Not on file     Family History: The patient's family history includes Cancer in his brother; Diabetes in his father, mother, and sister; Heart failure in his mother; Hypertension in his mother.  ROS:   Please see the history of present illness.    ROS  All other systems reviewed and negative.   EKGs/Labs/Other Studies Reviewed:    The following studies were reviewed today: PAP download  EKG:  EKG is not ordered today.    Recent Labs: 05/11/2018: ALT 55; B Natriuretic Peptide 740.6 05/16/2018: BUN 15; Creatinine, Ser 1.28; Hemoglobin 14.0; Magnesium 1.9; Platelets 165; Potassium 3.8; Sodium  138; TSH 3.157   Recent Lipid Panel    Component Value Date/Time   CHOL 95 04/12/2018 0216   CHOL 104 03/28/2018 0850   TRIG 36 04/12/2018 0216   HDL 33 (L) 04/12/2018 0216   HDL 35 (L) 03/28/2018 0850   CHOLHDL 2.9 04/12/2018 0216   VLDL 7 04/12/2018  0216   LDLCALC 55 04/12/2018 0216   LDLCALC 58 03/28/2018 0850    Physical Exam:    VS:  BP 122/73   Pulse 89   Ht 5\' 11"  (1.803 m)   Wt 165 lb 6.4 oz (75 kg)   SpO2 99%   BMI 23.07 kg/m     Wt Readings from Last 3 Encounters:  10/09/18 165 lb 6.4 oz (75 kg)  07/04/18 167 lb (75.8 kg)  06/27/18 165 lb (74.8 kg)     GEN:  Well nourished, well developed in no acute distress HEENT: Normal NECK: No JVD; No carotid bruits LYMPHATICS: No lymphadenopathy CARDIAC: RRR, no  rubs, gallops.  2/6 systolic murmur at the left lower sternal border. RESPIRATORY:  Clear to auscultation without rales, wheezing or rhonchi  ABDOMEN: Soft, non-tender, non-distended MUSCULOSKELETAL:  No edema; No deformity  SKIN: Warm and dry NEUROLOGIC:  Alert and oriented x 3 PSYCHIATRIC:  Normal affect   ASSESSMENT:    1. OSA (obstructive sleep apnea)   2. Chronic systolic CHF (congestive heart failure) ([place])   3. Coronary artery disease involving native coronary artery of native heart without angina pectoris   4. Essential hypertension   5. Aortic stenosis, severe   6. Dyslipidemia    PLAN:    In order of problems listed above:  1. OSA -  The PAP download was reviewed today and showed an AHI of 7.2/hr on 16/12 cm H2O with 0% compliance in using more than 4 hours nightly.  Patient has not been using his Pap device very much because he is having problems with the mask.  He says he has to pull the mask very tight in order for it to not leak and then could cause his eyes to swell.  I am going to order him a Respironics DreamWear full facemask under the nose to try and get a download in 4 weeks.  2.  Chronic systolic CHF -he does not appear volume  overloaded on exam today even though he has some shortness of breath.  I suspect his shortness of breath is mainly due to his LV dysfunction with poor cardiac output.  He has NYHA class II symptoms.  He has not required any diuretics recently and weight is actually below his weight when he came home from his CHF exacerbation.  He says his weight has been around 161 to 162 pounds and he was on 165 pounds when he came home from the hospital.  At this point I would not put him on diuretics as he does not appear to be retaining fluid.Marland Kitchen  His echo in 2019 showed moderate to severely reduced LV function with EF 30 to 35% with hypokinesis of the anterior septum and inferior wall.  I would like to change his Toprol to carvedilol he just got his Toprol refilled for 90 days.  Therefore we will continue current dose of Toprol and add losartan 25 mg daily.  If he tolerates this then I will have him follow-up in hypertension clinic in 1 week to make a change to Entresto 24-26 mg twice daily if blood pressure tolerates.  I would like him on maximum guideline directed medical therapy for heart failure for 2 months and then repeat a 2D echocardiogram to see if his  EF is improved.  We will check a bemet in 1 week to make sure potassium and renal function are stable.  He does have some CKD.  3.  ASCAD - s/p non-STEMI in 2013 treated  with PCI of the LCx/OM. LHC in 5/14 demonstrated severe 2 vessel CAD with critical in-stent restenosis in the LCx. He underwent CABG (SVG-PDA, SVG-LCx).  Repeat left heart cath showed patent SVG to RPDA, SVG to OM1, 50% ostial D1, 40% proximal to mid LAD, occluded ostial RCA, occluded ostial left circumflex, 40% ostial LAD.  He has no chest pain but does have chronic dyspnea on exertion which is fairly stable.  He will continue on aspirin 81 mg daily,  beta-blocker and statin.  4.  HTN -BP is well controlled on exam today.  He will continue on Toprol-XL 75 mg daily.  5.  Severe low gradient low  output AS - S/P TAVR 05-15-18 with 29 mm Edwards Sapein 3 THV via transfemoral approach.  2D echo 07/04/2018 showed a stable aortic valve TAVR with mild perivalvular AR.  Mean aortic valve gradient 6 mmHg.  Will continue on aspirin and Plavix until 11/13/2017 and then change to just aspirin.  6.  Dyslipidemia -his LDL goal is less than 70.  His LDL was 55 on 04/12/2018.  He will continue on Zocor 40 mg daily and I will repeat an FLP and ALT.  I have spent a total of 45 minutes with patient reviewing 2D echoes , telemetry, EKGs, labs and examining patient as well as establishing an assessment and plan that was discussed with the patient.  > 50% of time was spent in direct patient care.     Medication Adjustments/Labs and Tests Ordered: Current medicines are reviewed at length with the patient today.  Concerns regarding medicines are outlined above.  No orders of the defined types were placed in this encounter.  No orders of the defined types were placed in this encounter.   Signed, Fransico Him, MD  10/09/2018 3:15 PM    [place]

## 2018-10-09 NOTE — Patient Instructions (Signed)
Medication Instructions:  Start: Losartan 25 mg, daily, by mouth   If you need a refill on your cardiac medications before your next appointment, please call your pharmacy.   Lab work: Future: 1 week fasting labs, same day as Blood pressure clinic: BMET, LFT and Lipid  If you have labs (blood work) drawn today and your tests are completely normal, you will receive your results only by: Marland Kitchen MyChart Message (if you have MyChart) OR . A paper copy in the mail If you have any lab test that is abnormal or we need to change your treatment, we will call you to review the results.  Testing/Procedures: None  Follow-Up:Your physician recommends that you schedule a follow-up appointment in: 3 months with Dr. Radford Pax  Your physician recommends that you schedule a follow-up appointment in: 1 week with Blood Pressure clinic to consider Entresto.

## 2018-10-10 ENCOUNTER — Telehealth: Payer: Self-pay | Admitting: *Deleted

## 2018-10-10 NOTE — Telephone Encounter (Signed)
Oder placed to [facility] today.

## 2018-10-10 NOTE — Telephone Encounter (Signed)
-----   Message from Sarina Ill, RN sent at 10/10/2018  9:29 AM EST ----- Regarding: Sleep Hello, Dr. Radford Pax ordered a Respironics Dreamwear under the nose full face mask and a download in 4 weeks.  Thanks, [employer]

## 2018-10-11 ENCOUNTER — Encounter (HOSPITAL_COMMUNITY)
Admission: RE | Admit: 2018-10-11 | Discharge: 2018-10-11 | Disposition: A | Discharge status: Home / Self Care | Payer: Self-pay | Source: Ambulatory Visit | Attending: Cardiology | Admitting: Cardiology

## 2018-10-12 ENCOUNTER — Encounter (HOSPITAL_COMMUNITY)
Admission: RE | Admit: 2018-10-12 | Discharge: 2018-10-12 | Disposition: A | Discharge status: Home / Self Care | Payer: Self-pay | Source: Ambulatory Visit | Attending: Cardiology | Admitting: Cardiology

## 2018-10-16 ENCOUNTER — Encounter (HOSPITAL_COMMUNITY)
Admission: RE | Admit: 2018-10-16 | Discharge: 2018-10-16 | Disposition: A | Discharge status: Home / Self Care | Payer: Self-pay | Source: Ambulatory Visit | Attending: Cardiology | Admitting: Cardiology

## 2018-10-16 DIAGNOSIS — E119 Type 2 diabetes mellitus without complications: Secondary | ICD-10-CM | POA: Insufficient documentation

## 2018-10-16 DIAGNOSIS — Z951 Presence of aortocoronary bypass graft: Secondary | ICD-10-CM | POA: Insufficient documentation

## 2018-10-16 DIAGNOSIS — I110 Hypertensive heart disease with heart failure: Secondary | ICD-10-CM | POA: Insufficient documentation

## 2018-10-16 DIAGNOSIS — Z87891 Personal history of nicotine dependence: Secondary | ICD-10-CM | POA: Insufficient documentation

## 2018-10-16 DIAGNOSIS — Z9889 Other specified postprocedural states: Secondary | ICD-10-CM | POA: Insufficient documentation

## 2018-10-16 DIAGNOSIS — I5022 Chronic systolic (congestive) heart failure: Secondary | ICD-10-CM | POA: Insufficient documentation

## 2018-10-16 DIAGNOSIS — I2510 Atherosclerotic heart disease of native coronary artery without angina pectoris: Secondary | ICD-10-CM | POA: Insufficient documentation

## 2018-10-16 DIAGNOSIS — G4733 Obstructive sleep apnea (adult) (pediatric): Secondary | ICD-10-CM | POA: Insufficient documentation

## 2018-10-17 ENCOUNTER — Other Ambulatory Visit: Payer: Self-pay | Admitting: Physician Assistant

## 2018-10-17 ENCOUNTER — Other Ambulatory Visit: Payer: Medicare Other

## 2018-10-17 ENCOUNTER — Encounter (HOSPITAL_COMMUNITY): Payer: Medicare Other

## 2018-10-17 ENCOUNTER — Encounter (HOSPITAL_COMMUNITY)
Admission: RE | Admit: 2018-10-17 | Discharge: 2018-10-17 | Disposition: A | Discharge status: Home / Self Care | Payer: Medicare Other | Source: Ambulatory Visit | Attending: Physician Assistant | Admitting: Physician Assistant

## 2018-10-17 ENCOUNTER — Other Ambulatory Visit: Payer: Self-pay | Admitting: *Deleted

## 2018-10-17 DIAGNOSIS — J90 Pleural effusion, not elsewhere classified: Secondary | ICD-10-CM

## 2018-10-17 DIAGNOSIS — J181 Lobar pneumonia, unspecified organism: Secondary | ICD-10-CM | POA: Diagnosis not present

## 2018-10-17 DIAGNOSIS — R911 Solitary pulmonary nodule: Secondary | ICD-10-CM | POA: Diagnosis not present

## 2018-10-17 DIAGNOSIS — R222 Localized swelling, mass and lump, trunk: Secondary | ICD-10-CM | POA: Diagnosis not present

## 2018-10-17 LAB — GLUCOSE, CAPILLARY: GLUCOSE-CAPILLARY: 208 mg/dL — AB (ref 70–99)

## 2018-10-17 MED ORDER — FLUDEOXYGLUCOSE F - 18 (FDG) INJECTION
8.2000 | Freq: Once | INTRAVENOUS | Status: AC | PRN
  Administered 2018-10-17: 8.2 via INTRAVENOUS

## 2018-10-17 NOTE — Progress Notes (Signed)
Per Kathlene November, PA-C, placed order for IR thoracentesis, left sided, with labs of fluid.  Pt has pleural effusion with left sided lung mass. Scheduled with interventional radiology [phone]).  Pt is to arrive at 1:45 pm for 2pm appointment.  Adv patient's wife of plan who verbalizes understanding and agreement.

## 2018-10-17 NOTE — Telephone Encounter (Signed)
Per [facility] patient's cpap will be picked up in the next 3 days (10/19/18) due to non compliance. Patient's usage is down at 10/30 days =33% w/> 4hours =2 days and < 4 hours =8 days.

## 2018-10-18 ENCOUNTER — Other Ambulatory Visit: Payer: Self-pay | Admitting: Physician Assistant

## 2018-10-18 ENCOUNTER — Encounter (HOSPITAL_COMMUNITY)
Admission: RE | Admit: 2018-10-18 | Discharge: 2018-10-18 | Disposition: A | Discharge status: Home / Self Care | Payer: Self-pay | Source: Ambulatory Visit | Attending: Cardiology | Admitting: Cardiology

## 2018-10-18 ENCOUNTER — Telehealth: Payer: Self-pay | Admitting: Physician Assistant

## 2018-10-18 DIAGNOSIS — I5043 Acute on chronic combined systolic (congestive) and diastolic (congestive) heart failure: Secondary | ICD-10-CM

## 2018-10-18 MED ORDER — FUROSEMIDE 20 MG PO TABS: 20.0000 mg | ORAL_TABLET | Freq: Every day | ORAL | 11 refills | Status: DC

## 2018-10-18 NOTE — Telephone Encounter (Signed)
  [name] VALVE TEAM   Patient underwent TAVR in 05/2018.  Pre-TAVR scans showed an abnormality in the left lower lobe and pulmonary nodule could not be ruled out.  Follow-up CT was recommended in 2 to 3 months.   CT chest 06/28/2018 showed Persistent areas of atelectasis in the medial aspects of the superior segments of the lower lobes with persistent parenchymal opacity in the medial LEFT lower lobe, could represent rounded atelectasis or infiltrate though underlying mass is not excluded; follow-up in imaging in 3 months is recommended to exclude underlying mass.  CT chest 10/03/2018 showed Enlarging irregular masslike focus of consolidation in the medial left lower lobe measuring 3.5 x 2.2 cm. A neoplastic process including primary bronchogenic carcinoma cannot be excluded. PET-CT is suggested for further characterization.  PET CT on 10/17/2018 showed 1. Hypermetabolic 3.6 by 1.9 cm left lower lobe mass, maximum SUV 4.3, suspicious for potential adenocarcinoma. Tissue diagnosis recommended. 2. Small focus of accentuated activity along the left hilum could represent early metastatic spread but may be incidental. Maximum SUV in this vicinity is 2.7, just above the blood pool level which is 2.4. 3. Moderate bilateral pleural effusions, cause uncertain, increased in size compared to 10/03/2018.  I discussed the case with Dr. Roxy Manns who talked to Dr. Roxan Hockey.  Their recommendation was to proceed with left thoracentesis with cytology and follow-up in the multidisciplinary thoracic oncology center.    Thoracentesis has been arranged for tomorrow.  Per my discussion with Mrs. [name], the patient has had worsening dyspnea on exertion, abdominal distention and lower extremity edema.  He has a known cardiomyopathy with EF 30 to 35%.  He is not on any diuretic therapy.  I have recommended that he start low-dose Lasix 20 mg daily. I have asked them  to get a basic metabolic panel in the office tomorrow prior to his thoracentesis.  We will arrange follow-up with Dr. Roxan Hockey in the next few weeks.  Angelena Form PA-C  [facility]

## 2018-10-19 ENCOUNTER — Other Ambulatory Visit: Payer: Medicare Other | Admitting: *Deleted

## 2018-10-19 ENCOUNTER — Telehealth: Payer: Self-pay | Admitting: *Deleted

## 2018-10-19 ENCOUNTER — Encounter (HOSPITAL_COMMUNITY): Payer: Self-pay

## 2018-10-19 ENCOUNTER — Other Ambulatory Visit: Payer: Self-pay | Admitting: Physician Assistant

## 2018-10-19 ENCOUNTER — Ambulatory Visit (HOSPITAL_COMMUNITY): Admission: RE | Admit: 2018-10-19 | Payer: Medicare Other | Source: Ambulatory Visit

## 2018-10-19 ENCOUNTER — Ambulatory Visit (HOSPITAL_COMMUNITY)
Admission: RE | Admit: 2018-10-19 | Discharge: 2018-10-19 | Disposition: A | Discharge status: Home / Self Care | Payer: Medicare Other | Source: Ambulatory Visit | Attending: Physician Assistant | Admitting: Physician Assistant

## 2018-10-19 ENCOUNTER — Encounter (HOSPITAL_COMMUNITY): Payer: Self-pay | Admitting: Physician Assistant

## 2018-10-19 DIAGNOSIS — J90 Pleural effusion, not elsewhere classified: Secondary | ICD-10-CM

## 2018-10-19 DIAGNOSIS — I5043 Acute on chronic combined systolic (congestive) and diastolic (congestive) heart failure: Secondary | ICD-10-CM | POA: Diagnosis not present

## 2018-10-19 DIAGNOSIS — R911 Solitary pulmonary nodule: Secondary | ICD-10-CM

## 2018-10-19 DIAGNOSIS — R859 Unspecified abnormal finding in specimens from digestive organs and abdominal cavity: Secondary | ICD-10-CM | POA: Diagnosis not present

## 2018-10-19 HISTORY — PX: IR THORACENTESIS ASP PLEURAL SPACE W/IMG GUIDE: IMG5380

## 2018-10-19 LAB — AMYLASE, PLEURAL OR PERITONEAL FLUID: Amylase, Fluid: 34 U/L

## 2018-10-19 LAB — GRAM STAIN

## 2018-10-19 LAB — GLUCOSE, PLEURAL OR PERITONEAL FLUID: Glucose, Fluid: 213 mg/dL

## 2018-10-19 LAB — BODY FLUID CELL COUNT WITH DIFFERENTIAL
Eos, Fluid: 3 %
Lymphs, Fluid: 93 %
Monocyte-Macrophage-Serous Fluid: 3 % — ABNORMAL LOW (ref 50–90)
Neutrophil Count, Fluid: 1 % (ref 0–25)
WBC FLUID: 630 uL (ref 0–1000)

## 2018-10-19 LAB — PROTEIN, PLEURAL OR PERITONEAL FLUID: Total protein, fluid: <3 g/dL

## 2018-10-19 LAB — ALBUMIN, PLEURAL OR PERITONEAL FLUID: ALBUMIN FL: 1.2 g/dL

## 2018-10-19 LAB — LACTATE DEHYDROGENASE, PLEURAL OR PERITONEAL FLUID: LD, Fluid: 57 U/L — ABNORMAL HIGH (ref 3–23)

## 2018-10-19 MED ORDER — LIDOCAINE HCL (PF) 1 % IJ SOLN
INTRAMUSCULAR | Status: AC | PRN
  Administered 2018-10-19: 10 mL

## 2018-10-19 MED ORDER — LIDOCAINE HCL 1 % IJ SOLN
INTRAMUSCULAR | Status: AC
  Filled 2018-10-19: qty 20

## 2018-10-19 NOTE — Telephone Encounter (Signed)
Oncology Nurse Navigator Documentation  Oncology Nurse Navigator Flowsheets 10/19/2018  Navigator Location [facility]  Referral date to RadOnc/MedOnc 10/18/2018  Navigator Encounter Type Telephone/I received referral from Dr. Roxy Manns.  I called and schedule patient to be seen at [facility] next [facility] with Dr. Julien Nordmann and Dr. Roxan Hockey.  Patient and wife verbalized understanding of appt time and place.   Telephone Outgoing Call  Treatment Phase Abnormal Scans  Barriers/Navigation Needs Education;Coordination of Care  Education Other  Interventions Coordination of Care;Education  Coordination of Care Appts  Education Method Verbal  Acuity Level 2  Time Spent with Patient 30

## 2018-10-19 NOTE — Procedures (Signed)
PROCEDURE SUMMARY:  Successful image-guided left thoracentesis. Yielded 670 milliliters of clear yellow fluid. Patient tolerated procedure well. EBL: Zero No immediate complications.  Specimen was sent for labs. Post procedure CXR shows no pneumothorax.  Please see imaging section of Epic for full dictation.  Joaquim Nam PA-C 10/19/2018 2:11 PM

## 2018-10-20 LAB — BASIC METABOLIC PANEL
BUN/Creatinine Ratio: 17 (ref 10–24)
BUN: 22 mg/dL (ref 8–27)
CO2: 23 mmol/L (ref 20–29)
Calcium: 9.2 mg/dL (ref 8.6–10.2)
Chloride: 103 mmol/L (ref 96–106)
Creatinine, Ser: 1.3 mg/dL — ABNORMAL HIGH (ref 0.76–1.27)
GFR calc Af Amer: 64 mL/min/{1.73_m2} (ref >59)
GFR calc non Af Amer: 56 mL/min/{1.73_m2} — ABNORMAL LOW (ref >59)
Glucose: 153 mg/dL — ABNORMAL HIGH (ref 65–99)
Potassium: 4.2 mmol/L (ref 3.5–5.2)
Sodium: 140 mmol/L (ref 134–144)

## 2018-10-20 LAB — ACID FAST SMEAR (AFB): ACID FAST SMEAR - AFSCU2: NEGATIVE

## 2018-10-20 LAB — ACID FAST SMEAR (AFB, MYCOBACTERIA)

## 2018-10-23 ENCOUNTER — Encounter (HOSPITAL_COMMUNITY): Payer: Self-pay

## 2018-10-23 LAB — PH, BODY FLUID: pH, Body Fluid: 7.6

## 2018-10-23 LAB — PATHOLOGIST SMEAR REVIEW

## 2018-10-24 ENCOUNTER — Ambulatory Visit (INDEPENDENT_AMBULATORY_CARE_PROVIDER_SITE_OTHER): Payer: Medicare Other | Admitting: Pharmacist

## 2018-10-24 ENCOUNTER — Encounter (HOSPITAL_COMMUNITY)
Admission: RE | Admit: 2018-10-24 | Discharge: 2018-10-24 | Disposition: A | Discharge status: Home / Self Care | Payer: Self-pay | Source: Ambulatory Visit | Attending: Cardiology | Admitting: Cardiology

## 2018-10-24 ENCOUNTER — Other Ambulatory Visit: Payer: Self-pay

## 2018-10-24 VITALS — BP 108/70 | HR 89

## 2018-10-24 DIAGNOSIS — I5022 Chronic systolic (congestive) heart failure: Secondary | ICD-10-CM

## 2018-10-24 DIAGNOSIS — I2510 Atherosclerotic heart disease of native coronary artery without angina pectoris: Secondary | ICD-10-CM

## 2018-10-24 DIAGNOSIS — I5043 Acute on chronic combined systolic (congestive) and diastolic (congestive) heart failure: Secondary | ICD-10-CM

## 2018-10-24 LAB — BASIC METABOLIC PANEL
BUN/Creatinine Ratio: 15 (ref 10–24)
BUN: 24 mg/dL (ref 8–27)
CO2: 20 mmol/L (ref 20–29)
Calcium: 9.3 mg/dL (ref 8.6–10.2)
Chloride: 101 mmol/L (ref 96–106)
Creatinine, Ser: 1.57 mg/dL — ABNORMAL HIGH (ref 0.76–1.27)
GFR calc Af Amer: 51 mL/min/{1.73_m2} — ABNORMAL LOW (ref >59)
GFR calc non Af Amer: 44 mL/min/{1.73_m2} — ABNORMAL LOW (ref >59)
GLUCOSE: 236 mg/dL — AB (ref 65–99)
Potassium: 4.8 mmol/L (ref 3.5–5.2)
Sodium: 139 mmol/L (ref 134–144)

## 2018-10-24 LAB — CULTURE, BODY FLUID-BOTTLE: CULTURE: NO GROWTH

## 2018-10-24 LAB — CULTURE, BODY FLUID W GRAM STAIN -BOTTLE

## 2018-10-24 NOTE — Patient Instructions (Signed)
It was nice meeting you today!  Start taking 1/2 tablet of your losartan (12.5 mg) each day. Continue with the metoprolol and furosemide.  Monitor your pressure at home.  Call clinic if your top (systolic) number drops under 100 or you have any dizziness.  Our number is [phone].  Follow-up in clinic in three weeks for a blood pressure check.

## 2018-10-24 NOTE — Progress Notes (Signed)
Patient ID: [name]                 DOB: 11/07/1950                      MRN: [id number]     HPI: [name] is a 68 y.o. male referred by Dr. Radford Pax to HTN/HF clinic. PMH is significant for systolic HF with EF 30 to 35%, CAD s/p MI with PCI (2013) and CABG (2014), sleep apnea, diabetes, hyperlipidemia, and aortic stenosis. Patient underwent a TAVR following admission for acute CHF exacerbation on 05/15/2018. Patient saw Dr. Radford Pax in February 2020 and was started on losartan 25 mg with the plan to transition to [facility] if blood pressure tolerates. Patient was most recently started on low dose Lasix 20 mg on 10/18/2018 due to complaints of worsening dyspnea on exertion, abdominal distention, and lower extremity edema. Patient also underwent thoracentesis on 10/19/2018 and had 669mL of fluid removed. Patient presents to clinic today to assess blood pressure tolerance for switching to [facility].   Patient is in good spirits today following his appointment with Cardiac Rehab earlier this morning. Blood pressure at CR was 110/70 and 108/70 during clinic visit. Patient denies dizziness, light headedness, headache, and vision changes. Patient reports stopping his losartan after 2 doses due to swelling in legs. Patient reports tolerating furosemide well since starting it last week, although he does note some cramping. Patient reports that his home weights typically fluctuate between 156-163 lbs. Discussed with patient that edema is typically a symptom of his condition rather than losartan. Patient is agreeable to trying losartan again at a lower dose. Reviewed the cost of Entresto with patient if we were able to get him on therapy in the future; copay $40 per month after $435 deductible.   Current HF meds: metoprolol succinate 75 mg QD, furosemide 20 mg QD Previously tried: lisinopril (2014) BP goal: <130/80 mmHg  Family History: father - diabetes; mother - diabetes, HTN, HF; sister - diabetes; brother -  cancer   Social History: former smoker - quit 03/06/1985; denies alcohol and drug use  Diet: adds salt - tries to limit to under 2200 mg; drinks about 1L per day of diet coke/pepsi and flavored water   Exercise: did not review at today's visit  Home BP readings: has not been collecting - needs new batteries for cuff  Labs: 10/19/2018: SCr 1.3, K 4.2   Wt Readings from Last 3 Encounters:  10/09/18 165 lb 6.4 oz (75 kg)  07/04/18 167 lb (75.8 kg)  06/27/18 165 lb (74.8 kg)   BP Readings from Last 3 Encounters:  10/09/18 122/73  07/04/18 110/64  06/27/18 118/76   Pulse Readings from Last 3 Encounters:  10/09/18 89  07/04/18 76  06/27/18 96    Renal function: CrCl cannot be calculated (Unknown ideal weight.).  Past Medical History:  Diagnosis Date  . Acute on chronic combined systolic and diastolic CHF (congestive heart failure) ([place]) 04/16/2018  . Anemia   . Arthritis   . Chronic kidney disease   . Chronic systolic CHF (congestive heart failure) ([place]) 07/04/2018  . Coronary artery disease 02/2012   a. s/p stenting in 2013  b. s/p CABGx2V (SVG--> PDA, SVG--> LCx).  . Diabetes mellitus    neuropathy  insulin dependent  . Dyslipidemia   . Erectile dysfunction   . GERD (gastroesophageal reflux disease)   . History of CVA (cerebrovascular accident)   . History of kidney  stones   . Hx of radiation therapy to mediastinum 1985  . Hypertension   . 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 OSA (obstructive sleep apnea) 07/04/2018   Severe obstructive sleep apnea with an AHI of 30/h and mild central sleep apnea at 13.7/h with oxygen desaturations as low as 79%  . S/P TAVR (transcatheter aortic valve replacement) 05/15/2018   29 mm Edwards Sapien 3 transcatheter heart valve placed via percutaneous right transfemoral approach   . Severe aortic stenosis     Current Outpatient Medications on File Prior to Visit  Medication Sig Dispense Refill  . amitriptyline (ELAVIL) 100 MG  tablet Take 100 mg by mouth daily.     Marland Kitchen amoxicillin (AMOXIL) 500 MG capsule Take 1 capsule (500 mg total) by mouth as directed. Take 4 tablets (2000 mg ) 1 hour prior to dental procedures. 12 capsule 12  . aspirin 81 MG chewable tablet Chew 1 tablet (81 mg total) by mouth daily.    . B Complex-C (SUPER B COMPLEX PO) Take 1 tablet by mouth daily.    . B-D ULTRAFINE III SHORT PEN 31G X 8 MM MISC 1 each by Other route daily as needed (GLUCOSE TESTING (INSULINE NEEDLE)).     . Cholecalciferol (VITAMIN D-3) 5000 units TABS Take 5,000 Units by mouth daily.    . clopidogrel (PLAVIX) 75 MG tablet Take 1 tablet (75 mg total) by mouth daily with breakfast. 90 tablet 1  . furosemide (LASIX) 20 MG tablet Take 1 tablet (20 mg total) by mouth daily. 30 tablet 11  . HUMALOG KWIKPEN 100 UNIT/ML KiwkPen Per pump;  0.8 unit(s)/per hour average and patient gives a bolus, if needed    . hydroxypropyl methylcellulose / hypromellose (ISOPTO TEARS / GONIOVISC) 2.5 % ophthalmic solution Place 1 drop into both eyes 4 (four) times daily as needed for dry eyes.    Marland Kitchen levothyroxine (SYNTHROID, LEVOTHROID) 50 MCG tablet Take 50 mcg by mouth daily before breakfast. Per Dr. Chalmers Cater - 11/4  Pt instructed to take 1 1/2 every day Monday thru Saturday.  On Sunday take 2 tablets.    Marland Kitchen losartan (COZAAR) 25 MG tablet Take 1 tablet (25 mg total) by mouth daily for 28 days. 28 tablet 0  . metFORMIN (GLUCOPHAGE) 500 MG tablet Take 2 tablets (1,000 mg total) by mouth 2 (two) times daily with a meal.    . metoprolol succinate (TOPROL-XL) 50 MG 24 hr tablet Take 75 mg by mouth daily. Take with or immediately following a meal.    . Multiple Vitamin (MULTIVITAMIN) tablet Take 1 tablet by mouth daily.    . ONE TOUCH ULTRA TEST test strip 1 each by Other route as needed for other.     . simvastatin (ZOCOR) 40 MG tablet Take 40 mg by mouth every evening.    . zolpidem (AMBIEN) 10 MG tablet Take 10 mg by mouth daily.      No current  facility-administered medications on file prior to visit.     Allergies  Allergen Reactions  . Lipitor [Atorvastatin] Other (See Comments)    Muscle pain  . Adhesive [Tape] Rash     Assessment/Plan:  1. Heart Failure - Patient's blood pressure is soft on metoprolol succinate and furosemide. Will rechallenge patient at a lower dose of losartan 12.5 mg daily to try to maximize guideline-directed therapy. Unfortunately, Delene Loll will be cost prohibitive for patient. Will collect BMET today to assess renal function and electrolytes since starting furosemide, especially in setting  of patient-reported cramping. Follow-up scheduled in 3 weeks. Will consider losartan dose titration if tolerated. Patient will call if systolic BP drops below 381 or with any dizziness.  Patient seen with Willia Craze, PharmD Candidate  Megan E. Supple, PharmD, BCACP, [place] [address] [place], [place] [address] Phone: [phone]; Fax: [phone] 10/24/2018 11:15 AM

## 2018-10-25 ENCOUNTER — Encounter: Payer: Medicare Other | Admitting: Thoracic Surgery (Cardiothoracic Vascular Surgery)

## 2018-10-25 ENCOUNTER — Encounter (HOSPITAL_COMMUNITY)
Admission: RE | Admit: 2018-10-25 | Discharge: 2018-10-25 | Disposition: A | Discharge status: Home / Self Care | Payer: Self-pay | Source: Ambulatory Visit | Attending: Cardiology | Admitting: Cardiology

## 2018-10-25 ENCOUNTER — Inpatient Hospital Stay: Payer: Medicare Other | Attending: Internal Medicine

## 2018-10-25 ENCOUNTER — Inpatient Hospital Stay (HOSPITAL_BASED_OUTPATIENT_CLINIC_OR_DEPARTMENT_OTHER): Payer: Medicare Other | Admitting: Internal Medicine

## 2018-10-25 ENCOUNTER — Other Ambulatory Visit: Payer: Self-pay

## 2018-10-25 DIAGNOSIS — D649 Anemia, unspecified: Secondary | ICD-10-CM | POA: Insufficient documentation

## 2018-10-25 DIAGNOSIS — Z794 Long term (current) use of insulin: Secondary | ICD-10-CM | POA: Diagnosis not present

## 2018-10-25 DIAGNOSIS — E1122 Type 2 diabetes mellitus with diabetic chronic kidney disease: Secondary | ICD-10-CM

## 2018-10-25 DIAGNOSIS — Z8547 Personal history of malignant neoplasm of testis: Secondary | ICD-10-CM

## 2018-10-25 DIAGNOSIS — R918 Other nonspecific abnormal finding of lung field: Secondary | ICD-10-CM

## 2018-10-25 DIAGNOSIS — G4733 Obstructive sleep apnea (adult) (pediatric): Secondary | ICD-10-CM | POA: Insufficient documentation

## 2018-10-25 DIAGNOSIS — Z87891 Personal history of nicotine dependence: Secondary | ICD-10-CM | POA: Diagnosis not present

## 2018-10-25 DIAGNOSIS — Z7982 Long term (current) use of aspirin: Secondary | ICD-10-CM | POA: Diagnosis not present

## 2018-10-25 DIAGNOSIS — R911 Solitary pulmonary nodule: Secondary | ICD-10-CM

## 2018-10-25 DIAGNOSIS — I130 Hypertensive heart and chronic kidney disease with heart failure and stage 1 through stage 4 chronic kidney disease, or unspecified chronic kidney disease: Secondary | ICD-10-CM | POA: Insufficient documentation

## 2018-10-25 DIAGNOSIS — N189 Chronic kidney disease, unspecified: Secondary | ICD-10-CM

## 2018-10-25 DIAGNOSIS — Z79899 Other long term (current) drug therapy: Secondary | ICD-10-CM | POA: Insufficient documentation

## 2018-10-25 DIAGNOSIS — Z800 Family history of malignant neoplasm of digestive organs: Secondary | ICD-10-CM

## 2018-10-25 DIAGNOSIS — E042 Nontoxic multinodular goiter: Secondary | ICD-10-CM | POA: Diagnosis not present

## 2018-10-25 DIAGNOSIS — E785 Hyperlipidemia, unspecified: Secondary | ICD-10-CM

## 2018-10-25 DIAGNOSIS — Z923 Personal history of irradiation: Secondary | ICD-10-CM

## 2018-10-25 LAB — CMP (CANCER CENTER ONLY)
ALT: 33 U/L (ref 0–44)
AST: 23 U/L (ref 15–41)
Albumin: 3.5 g/dL (ref 3.5–5.0)
Alkaline Phosphatase: 81 U/L (ref 38–126)
Anion gap: 10 (ref 5–15)
BUN: 23 mg/dL (ref 8–23)
CO2: 24 mmol/L (ref 22–32)
Calcium: 8.8 mg/dL — ABNORMAL LOW (ref 8.9–10.3)
Chloride: 106 mmol/L (ref 98–111)
Creatinine: 1.5 mg/dL — ABNORMAL HIGH (ref 0.61–1.24)
GFR, EST AFRICAN AMERICAN: 54 mL/min — AB (ref >60)
GFR, Estimated: 47 mL/min — ABNORMAL LOW (ref >60)
Glucose, Bld: 137 mg/dL — ABNORMAL HIGH (ref 70–99)
Potassium: 3.9 mmol/L (ref 3.5–5.1)
Sodium: 140 mmol/L (ref 135–145)
TOTAL PROTEIN: 6.9 g/dL (ref 6.5–8.1)
Total Bilirubin: 0.5 mg/dL (ref 0.3–1.2)

## 2018-10-25 LAB — CBC WITH DIFFERENTIAL (CANCER CENTER ONLY)
Abs Immature Granulocytes: 0.01 10*3/uL (ref 0.00–0.07)
Basophils Absolute: 0.1 10*3/uL (ref 0.0–0.1)
Basophils Relative: 1 %
Eosinophils Absolute: 0.5 10*3/uL (ref 0.0–0.5)
Eosinophils Relative: 9 %
HCT: 44.2 % (ref 39.0–52.0)
Hemoglobin: 14.4 g/dL (ref 13.0–17.0)
Immature Granulocytes: 0 %
LYMPHS ABS: 1.4 10*3/uL (ref 0.7–4.0)
Lymphocytes Relative: 26 %
MCH: 25.2 pg — AB (ref 26.0–34.0)
MCHC: 32.6 g/dL (ref 30.0–36.0)
MCV: 77.3 fL — ABNORMAL LOW (ref 80.0–100.0)
MONO ABS: 0.3 10*3/uL (ref 0.1–1.0)
Monocytes Relative: 5 %
Neutro Abs: 3.1 10*3/uL (ref 1.7–7.7)
Neutrophils Relative %: 59 %
Platelet Count: 183 10*3/uL (ref 150–400)
RBC: 5.72 MIL/uL (ref 4.22–5.81)
RDW: 15.3 % (ref 11.5–15.5)
WBC Count: 5.3 10*3/uL (ref 4.0–10.5)
nRBC: 0 % (ref 0.0–0.2)

## 2018-10-25 NOTE — Progress Notes (Signed)
[place] [phone]   [phone] Multidisciplinary thoracic oncology clinic  CONSULT NOTE  REFERRING PHYSICIAN: Dr. Darylene Price  REASON FOR CONSULTATION:  68 years old African-American male with suspicious left lung nodule  HPI [name] is a 68 y.o. male with past medical history significant for multiple medical problems including history of congestive heart failure, anemia, osteoarthritis, chronic kidney disease, hypertension, diabetes mellitus, dyslipidemia, GERD, stroke in 2012, history of questionable malignant seminoma, aortic stenosis as well as obstructive sleep apnea and history for smoking but quit in 1986. The patient was followed by his cardiothoracic surgeon Dr. Roxy Manns for the aortic stenosis and was complaining of shortness of breath with exertion.  He had CT scan of the chest on 10/03/2018 and it showed an irregular masslike focus of consolidation in the medial left lower lobe measuring 3.5 x 2.2 cm abutting the pleural effusion with associated volume loss and mild distortion increased from 2.8 x 1.5 cm on April 26, 2018 and is slightly increased in size from 3.4 x 1.9 cm on June 27, 2018 scan.  There was also right upper lobe 0.6 cm solid pulmonary nodule stable since April 26, 2018 and likely benign.  There was a small dependent bilateral pleural effusions.  There was no appreciable thoracic adenopathy on the noncontrast CT.  The patient had a PET scan on October 17, 2018 and that showed hypermetabolic 3.6 x 1.9 cm left lower lobe mass with maximum SUV of four-point still suspicious for potential adenocarcinoma and tissue diagnosis was recommended.  There was a small focus of accentuated activity along the left hilum that could represent early metastatic spread but may be incidental.  There was moderate bilateral pleural effusion of unclear etiology.  The 6 mm right upper lobe lesion is not hypermetabolic and below the PET CT  sensitivity.  On October 19, 2018 the patient underwent ultrasound-guided left thoracentesis with drainage of 670 mL of pleural fluid. The patient was referred to the multidisciplinary thoracic oncology clinic today for evaluation and recommendation regarding this abnormality on his scan. When seen today he is feeling fine with no concerning complaints.  He denied having any chest pain, shortness of breath, cough or hemoptysis.  He has no nausea, vomiting, diarrhea or constipation.  He denied having any weight loss or night sweats.  He has no headache or visual changes. Family history significant for mother and father with diabetes mellitus and brother with pancreatic cancer. The patient is married and has 2 sons.  He was accompanied today by his wife [name].  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  He has a history of smoking for around 20 years but quit in 1986.  He has history of alcohol abuse but quit in 1994 and no history of drug abuse.   HPI  Past Medical History:  Diagnosis Date   Acute on chronic combined systolic and diastolic CHF (congestive heart failure) ([place]) 04/16/2018   Anemia    Arthritis    Chronic kidney disease    Chronic systolic CHF (congestive heart failure) ([place]) 07/04/2018   Coronary artery disease 02/2012   a. s/p stenting in 2013  b. s/p CABGx2V (SVG--> PDA, SVG--> LCx).   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   OSA (obstructive sleep apnea) 07/04/2018   Severe obstructive sleep apnea with an AHI of 30/h and mild central sleep apnea at 13.7/h with oxygen desaturations as low as 79%   S/P TAVR (transcatheter aortic valve replacement) 05/15/2018   29 mm Edwards  Sapien 3 transcatheter heart valve placed via percutaneous right transfemoral approach    Severe aortic stenosis     Past Surgical History:  Procedure Laterality Date   COLONOSCOPY  07/25/2012   Procedure: COLONOSCOPY;  Surgeon: Winfield Cunas., MD;  Location: [facility] ENDOSCOPY;  Service: Endoscopy;  Laterality: N/A;   COLONOSCOPY N/A 12/02/2013   Procedure: COLONOSCOPY;  Surgeon: Winfield Cunas., MD;  Location: [facility] ENDOSCOPY;  Service: Endoscopy;  Laterality: N/A;   CORONARY ARTERY BYPASS GRAFT N/A 01/04/2013   Procedure: CORONARY ARTERY BYPASS GRAFTING (CABG) times two using right saphenous vein harvested with endoscope.;  Surgeon: Ivin Poot, MD;  Location: [place];  Service: Open Heart Surgery;  Laterality: N/A;   ESOPHAGOGASTRODUODENOSCOPY  07/20/2012   Procedure: ESOPHAGOGASTRODUODENOSCOPY (EGD);  Surgeon: Winfield Cunas., MD;  Location: [facility] ENDOSCOPY;  Service: Endoscopy;  Laterality: N/A;   ESOPHAGOGASTRODUODENOSCOPY N/A 12/02/2013   Procedure: ESOPHAGOGASTRODUODENOSCOPY (EGD);  Surgeon: Winfield Cunas., MD;  Location: [facility] ENDOSCOPY;  Service: Endoscopy;  Laterality: N/A;   HOT HEMOSTASIS N/A 12/02/2013   Procedure: HOT HEMOSTASIS (ARGON PLASMA COAGULATION/BICAP);  Surgeon: Winfield Cunas., MD;  Location: [facility] ENDOSCOPY;  Service: Endoscopy;  Laterality: N/A;   INTRAOPERATIVE TRANSESOPHAGEAL ECHOCARDIOGRAM N/A 01/04/2013   Procedure: INTRAOPERATIVE TRANSESOPHAGEAL ECHOCARDIOGRAM;  Surgeon: Ivin Poot, MD;  Location: [place];  Service: Open Heart Surgery;  Laterality: N/A;   INTRAOPERATIVE TRANSTHORACIC ECHOCARDIOGRAM N/A 05/15/2018   Procedure: INTRAOPERATIVE TRANSTHORACIC ECHOCARDIOGRAM;  Surgeon: Burnell Blanks, MD;  Location: [place];  Service: Open Heart Surgery;  Laterality: N/A;   IR THORACENTESIS ASP PLEURAL SPACE W/IMG GUIDE  10/19/2018   LEFT HEART CATHETERIZATION WITH CORONARY ANGIOGRAM N/A 03/06/2012   Procedure: LEFT HEART CATHETERIZATION WITH  CORONARY ANGIOGRAM;  Surgeon: [name], MD;  Location: [place] CATH LAB;  Service: Cardiovascular;  Laterality: N/A;   LITHOTRIPSY     [place] Left 01/04/2013   Procedure: RADIAL ARTERY HARVEST;  Surgeon: Ivin Poot, MD;  Location: [place];  Service: Vascular;  Laterality: Left;  Artery not havested. Unsuitable for use.   resection mediastinal seminonma     RIGHT/LEFT HEART CATH AND CORONARY/GRAFT ANGIOGRAPHY N/A 11/15/2016   Procedure: Right/Left Heart Cath and Coronary/Graft Angiography;  Surgeon: Leonie Man, MD;  Location: [facility] CV LAB;  Service: Cardiovascular;  Laterality: N/A;   RIGHT/LEFT HEART CATH AND CORONARY/GRAFT ANGIOGRAPHY N/A 04/13/2018   Procedure: RIGHT/LEFT HEART CATH AND CORONARY/GRAFT ANGIOGRAPHY;  Surgeon: Jolaine Artist, MD;  Location: [place] CV LAB;  Service: Cardiovascular;  Laterality: N/A;   TRANSCATHETER AORTIC VALVE REPLACEMENT, TRANSFEMORAL  05/15/2018   TRANSCATHETER AORTIC VALVE REPLACEMENT, TRANSFEMORAL N/A 05/15/2018   Procedure: TRANSCATHETER AORTIC VALVE REPLACEMENT, TRANSFEMORAL;  Surgeon: Burnell Blanks, MD;  Location: [place];  Service: Open Heart Surgery;  Laterality: N/A;    Family History  Problem Relation Age of Onset   Diabetes Father    Diabetes Mother    Heart failure Mother    Hypertension Mother    Cancer Brother    Diabetes Sister     Social  History Social History   Tobacco Use   Smoking status: Former Smoker    Last attempt to quit: 03/06/1985    Years since quitting: 33.6   Smokeless tobacco: Never Used  Substance Use Topics   Alcohol use: No   Drug use: No    Allergies  Allergen Reactions   Lipitor [Atorvastatin] Other (See Comments)    Muscle pain   Adhesive [Tape] Rash    Current Outpatient Medications  Medication Sig Dispense Refill   amitriptyline (ELAVIL) 100 MG tablet Take 100 mg by mouth daily.      amoxicillin (AMOXIL) 500 MG  capsule Take 1 capsule (500 mg total) by mouth as directed. Take 4 tablets (2000 mg ) 1 hour prior to dental procedures. 12 capsule 12   aspirin 81 MG chewable tablet Chew 1 tablet (81 mg total) by mouth daily.     B Complex-C (SUPER B COMPLEX PO) Take 1 tablet by mouth daily.     B-D ULTRAFINE III SHORT PEN 31G X 8 MM MISC 1 each by Other route daily as needed (GLUCOSE TESTING (INSULINE NEEDLE)).      Cholecalciferol (VITAMIN D-3) 5000 units TABS Take 5,000 Units by mouth daily.     clopidogrel (PLAVIX) 75 MG tablet Take 1 tablet (75 mg total) by mouth daily with breakfast. 90 tablet 1   furosemide (LASIX) 20 MG tablet Take 1 tablet (20 mg total) by mouth daily. 30 tablet 11   HUMALOG KWIKPEN 100 UNIT/ML KiwkPen Per pump;  0.8 unit(s)/per hour average and patient gives a bolus, if needed     hydroxypropyl methylcellulose / hypromellose (ISOPTO TEARS / GONIOVISC) 2.5 % ophthalmic solution Place 1 drop into both eyes 4 (four) times daily as needed for dry eyes.     levothyroxine (SYNTHROID, LEVOTHROID) 50 MCG tablet Take 50 mcg by mouth daily before breakfast. Per Dr. Chalmers Cater - 11/4  Pt instructed to take 1 1/2 every day Monday thru Saturday.  On Sunday take 2 tablets.     losartan (COZAAR) 25 MG tablet Take 0.5 tablets (12.5 mg total) by mouth daily. 14 tablet 0   metFORMIN (GLUCOPHAGE) 500 MG tablet Take 2 tablets (1,000 mg total) by mouth 2 (two) times daily with a meal.     metoprolol succinate (TOPROL-XL) 50 MG 24 hr tablet Take 75 mg by mouth daily. Take with or immediately following a meal.     Multiple Vitamin (MULTIVITAMIN) tablet Take 1 tablet by mouth daily.     ONE TOUCH ULTRA TEST test strip 1 each by Other route as needed for other.      simvastatin (ZOCOR) 40 MG tablet Take 40 mg by mouth every evening.     zolpidem (AMBIEN) 10 MG tablet Take 10 mg by mouth daily.      No current facility-administered medications for this visit.     Review of  Systems  Constitutional: positive for fatigue Eyes: negative Ears, nose, mouth, throat, and face: negative Respiratory: negative Cardiovascular: negative Gastrointestinal: negative Genitourinary:negative Integument/breast: negative Hematologic/lymphatic: negative Musculoskeletal:negative Neurological: negative Behavioral/Psych: negative Endocrine: negative Allergic/Immunologic: negative  Physical Exam  BJS:EGBTD, healthy, no distress, well nourished and well developed SKIN: skin color, texture, turgor are normal, no rashes or significant lesions HEAD: Normocephalic, No masses, lesions, tenderness or abnormalities EYES: normal, PERRLA, Conjunctiva are pink and non-injected EARS: External ears normal, TM's Normal OROPHARYNX:no exudate and no erythema  NECK: supple, no adenopathy, no JVD LYMPH:  no palpable lymphadenopathy, no hepatosplenomegaly LUNGS: clear to auscultation ,  and palpation HEART: regular rate & rhythm, no murmurs and no gallops ABDOMEN:abdomen soft, non-tender, normal bowel sounds and no masses or organomegaly BACK: Back symmetric, no curvature., No CVA tenderness EXTREMITIES:no joint deformities, effusion, or inflammation, no edema  NEURO: alert & oriented x 3 with fluent speech, no focal motor/sensory deficits  PERFORMANCE STATUS: ECOG 1  LABORATORY DATA: Lab Results  Component Value Date   WBC 5.3 10/25/2018   HGB 14.4 10/25/2018   HCT 44.2 10/25/2018   MCV 77.3 (L) 10/25/2018   PLT 183 10/25/2018      Chemistry      Component Value Date/Time   NA 140 10/25/2018 1429   NA 139 10/24/2018 1037   K 3.9 10/25/2018 1429   CL 106 10/25/2018 1429   CO2 24 10/25/2018 1429   BUN 23 10/25/2018 1429   BUN 24 10/24/2018 1037   CREATININE 1.50 (H) 10/25/2018 1429      Component Value Date/Time   CALCIUM 8.8 (L) 10/25/2018 1429   ALKPHOS 81 10/25/2018 1429   AST 23 10/25/2018 1429   ALT 33 10/25/2018 1429   BILITOT 0.5 10/25/2018 1429        RADIOGRAPHIC STUDIES: Dg Chest 1 View  Result Date: 10/20/2018 CLINICAL DATA:  Status post left thoracentesis. EXAM: CHEST  1 VIEW COMPARISON:  PET-CT March 4th 2020 FINDINGS: Cardiomediastinal silhouette is stable. There is a small right pleural effusion. No left pleural effusion identified. No pneumothorax after thoracentesis. No other acute abnormalities identified. IMPRESSION: Small right pleural effusion. No identified left-sided pleural effusion. No pneumothorax after left thoracentesis. No definitive nodule, mass, or infiltrate seen on this single view chest x-ray. Electronically Signed   By: Dorise Bullion III M.D   On: 10/20/2018 19:50   Ct Chest Wo Contrast  Result Date: 10/03/2018 CLINICAL DATA:  Follow-up pulmonary nodule. Remote history of resected mediastinal malignant seminoma treated with radiation therapy in 1985. EXAM: CT CHEST WITHOUT CONTRAST TECHNIQUE: Multidetector CT imaging of the chest was performed following the standard protocol without IV contrast. COMPARISON:  06/27/2018 chest CT. FINDINGS: Cardiovascular: Stable top-normal heart size. No significant pericardial effusion/thickening. Aortic valve prosthesis is in place. Three-vessel coronary atherosclerosis status post CABG. Atherosclerotic nonaneurysmal thoracic aorta. Normal caliber pulmonary arteries. Mediastinum/Nodes: Subcentimeter hypodense left thyroid nodule is stable. Unremarkable esophagus. No pathologically enlarged axillary, mediastinal or hilar lymph nodes, noting limited sensitivity for the detection of hilar adenopathy on this noncontrast study. Stable postsurgical changes in the anterior mediastinum with no discrete recurrent anterior mediastinal mass. Lungs/Pleura: No pneumothorax. Small dependent bilateral pleural effusions. Irregular masslike focus of consolidation in the medial left lower lobe measures 3.5 x 2.2 cm (series 3/image 82), abutting the pleural effusion, with associated volume loss and mild  distortion, increased from 2.8 x 1.5 cm on 04/26/2018 and slightly increased from 3.4 x 1.9 cm on 06/27/2018 chest CT. Paramediastinal sharply marginated reticulation and consolidation throughout both lungs with associated mild bronchiectasis, volume loss and distortion, unchanged. Basilar right upper lobe 6 mm solid pulmonary nodule (series 3/image 79), stable since 04/26/2018 CT using similar measurement technique. No new significant pulmonary nodules. Upper abdomen: No acute abnormality. Musculoskeletal: No aggressive appearing focal osseous lesions. Stable discontinuity in third most superior sternotomy wire. Otherwise intact sternotomy wires. IMPRESSION: 1. Enlarging irregular masslike focus of consolidation in the medial left lower lobe measuring 3.5 x 2.2 cm. A neoplastic process including primary bronchogenic carcinoma cannot be excluded. PET-CT is suggested for further characterization. 2. Right upper lobe 6 mm solid pulmonary nodule  is stable since 04/26/2018 CT, probably benign. 3. Small dependent bilateral pleural effusions. 4. No appreciable thoracic adenopathy on this noncontrast CT. Aortic Atherosclerosis (ICD10-I70.0). These results will be called to the ordering clinician or representative by the Radiologist Assistant, and communication documented in the PACS or zVision Dashboard. Electronically Signed   By: Ilona Sorrel M.D.   On: 10/03/2018 13:42   Nm Pet Image Initial (pi) Skull Base To Thigh  Result Date: 10/17/2018 CLINICAL DATA:  Initial treatment strategy for left lower lobe mass. EXAM: NUCLEAR MEDICINE PET SKULL BASE TO THIGH TECHNIQUE: 8.2 mCi F-18 FDG was injected intravenously. Full-ring PET imaging was performed from the skull base to thigh after the radiotracer. CT data was obtained and used for attenuation correction and anatomic localization. Fasting blood glucose: 208 mg/dl COMPARISON:  CT scan 10/03/2018 FINDINGS: Mediastinal blood pool activity: SUV max 2.4 NECK: No significant  abnormal hypermetabolic activity in this region. Incidental CT findings: Mucous retention cyst of the left maxillary sinus. Bilateral common carotid artery atherosclerotic calcification. Small nodules in the thyroid gland are not hypermetabolic. CHEST: The left lower lobe mass measures approximately 3.6 by 1.9 cm and has maximum SUV of 4.3. Subtle accentuated left hilar activity without a well-defined lymph node, maximum SUV 2.7, just above the blood pool. Postoperative findings in the anterior mediastinum with multiple clips in the region, indistinct density, maximum SUV 3.3. Incidental CT findings: Moderate bilateral pleural effusions. Coronary, aortic arch, and branch vessel atherosclerotic vascular disease. Aortic valve prosthesis. Mild cardiomegaly. 6 by 4 mm right upper lobe pulmonary nodule just above the minor fissure on image 32/8, no appreciable hypermetabolic activity, this lesion is below sensitive PET-CT size thresholds. Passive atelectasis both lungs noted. ABDOMEN/PELVIS: Scattered physiologic activity in bowel, including a segment of what appears to be small bowel adjacent to the left renal hilum which has no abnormal CT characteristic and accordingly is likely incidental. Incidental CT findings: Aortoiliac atherosclerotic vascular disease. 9 mm stone in the right kidney lower pole. Prominence of stool in the rectal vault. Scattered calcifications along the tunic albuginea of the penis. SKELETON: No significant abnormal hypermetabolic activity in this region. Incidental CT findings: Degenerative findings at the left sternoclavicular joint. IMPRESSION: 1. Hypermetabolic 3.6 by 1.9 cm left lower lobe mass, maximum SUV 4.3, suspicious for potential adenocarcinoma. Tissue diagnosis recommended. 2. Small focus of accentuated activity along the left hilum could represent early metastatic spread but may be incidental. Maximum SUV in this vicinity is 2.7, just above the blood pool level which is 2.4. 3.  Moderate bilateral pleural effusions, cause uncertain, increased in size compared to 10/03/2018. 4. The 6 mm right upper lobe lesion is not hypermetabolic but is below sensitive PET-CT size thresholds. 5. Postoperative findings and low-grade activity in the anterior mediastinum, not entirely specific but likely related to prior CABG. 6. Other imaging findings of potential clinical significance: Nonobstructive right nephrolithiasis. Aortic Atherosclerosis (ICD10-I70.0). Coronary atherosclerosis. Aortic valve prosthesis. Prominence of stool in the rectal vault. Scattered calcifications along the tunica albuginea of the penis. Mucous retention cyst in left maxillary sinus. Degenerative left sternoclavicular joint. Electronically Signed   By: Van Clines M.D.   On: 10/17/2018 09:34   Ir Thoracentesis Asp Pleural Space W/img Guide  Result Date: 10/19/2018 INDICATION: Patient with history of CAD, aortic stenosis, history of TAVR (2019) and CHF noted to have symptomatic bilateral pleural effusions on most recent imaging. Request for diagnostic and therapeutic thoracentesis today in IR. EXAM: ULTRASOUND GUIDED LEFT THORACENTESIS MEDICATIONS: 7 mL 1%  lidocaine. COMPLICATIONS: None immediate. PROCEDURE: An ultrasound guided thoracentesis was thoroughly discussed with the patient and questions answered. The benefits, risks, alternatives and complications were also discussed. The patient understands and wishes to proceed with the procedure. Written consent was obtained. Ultrasound was performed to localize and mark an adequate pocket of fluid in the left chest. The area was then prepped and draped in the normal sterile fashion. 1% Lidocaine was used for local anesthesia. Under ultrasound guidance a 6 Fr Safe-T-Centesis catheter was introduced. Thoracentesis was performed. The catheter was removed and a dressing applied. FINDINGS: A total of approximately 670 mL of clear yellow fluid was removed. Samples were sent to  the laboratory as requested by the clinical team. IMPRESSION: Successful ultrasound guided left thoracentesis yielding 670 mL of pleural fluid. Read by Candiss Norse, PA-C Electronically Signed   By: Jerilynn Mages.  Shick M.D.   On: 10/19/2018 16:02    ASSESSMENT: This is a very pleasant 68 years old African-American male with multiple comorbidities who presented with left lower lobe lung mass suspicious for primary lung malignancy.   PLAN: I had a lengthy discussion with the patient today about his current disease stage, prognosis and treatment options.  I personally and independently reviewed the scan images and discussed the result and showed the images to the patient and his wife today. I recommended for the patient to see Dr. Roxan Hockey for consideration of bronchoscopy with endobronchial ultrasound and biopsy of the left lower lobe lung mass for tissue diagnosis. I do not think the patient is a good candidate for any surgical resection because of his comorbidities. Once the tissue diagnosis is confirmed to represent a lung malignancy, the patient may benefit from stereotactic body radiotherapy to this lesion by radiation oncology. We will arrange for the patient a follow-up appointment with radiation oncology after his tissue diagnosis. I will see him back for follow-up visit 3-4 months after his radiotherapy for reevaluation with repeat imaging studies.  I will arrange this appointment later on once we confirm his diagnosis. The patient was advised immediately if he has any concerning symptoms in the interval. The patient voices understanding of current disease status and treatment options and is in agreement with the current care plan.  All questions were answered. The patient knows to call the clinic with any problems, questions or concerns. We can certainly see the patient much sooner if necessary.  Thank you so much for allowing me to participate in the care of [name]. I will continue to  follow up the patient with you and assist in his care.  I spent 40 minutes counseling the patient face to face. The total time spent in the appointment was 60 minutes.  Disclaimer: This note was dictated with voice recognition software. Similar sounding words can inadvertently be transcribed and may not be corrected upon review.   Eilleen Kempf October 25, 2018, 3:24 PM

## 2018-10-26 ENCOUNTER — Encounter (HOSPITAL_COMMUNITY)
Admission: RE | Admit: 2018-10-26 | Discharge: 2018-10-26 | Disposition: A | Discharge status: Home / Self Care | Payer: Self-pay | Source: Ambulatory Visit | Attending: Cardiology | Admitting: Cardiology

## 2018-10-27 ENCOUNTER — Encounter: Payer: Self-pay | Admitting: Internal Medicine

## 2018-10-27 DIAGNOSIS — R911 Solitary pulmonary nodule: Secondary | ICD-10-CM | POA: Insufficient documentation

## 2018-10-29 ENCOUNTER — Telehealth: Payer: Self-pay

## 2018-10-29 ENCOUNTER — Encounter: Payer: Self-pay | Admitting: *Deleted

## 2018-10-29 ENCOUNTER — Telehealth (HOSPITAL_COMMUNITY): Payer: Self-pay

## 2018-10-29 DIAGNOSIS — Z79899 Other long term (current) drug therapy: Secondary | ICD-10-CM

## 2018-10-29 NOTE — Telephone Encounter (Signed)
Notes recorded by Frederik Schmidt, RN on 10/29/2018 at 9:35 AM EDT Spoke to patient's wife and gave her results/recommendations. They will come in on Tuesday 3/17 for labs. ------

## 2018-10-29 NOTE — Telephone Encounter (Signed)
Phone call to patient communicated that cardiac rehab will be closing for 2 weeks d/t the corona virus. Pt verbalized understanding.

## 2018-10-29 NOTE — Progress Notes (Signed)
Oncology Nurse Navigator Documentation  Oncology Nurse Navigator Flowsheets 10/29/2018  Navigator Location [facility]  Navigator Encounter Type Clinic/MDC/patient was seen at [facility] last week.  Per Dr. Julien Nordmann, patient needs to be seen with Dr. Roxan Hockey.  He is set up on 10/31/2018.    Abnormal Finding Date 10/03/2018  Multidisiplinary Clinic Date 10/25/2018  Patient Visit Type MedOnc  Treatment Phase Abnormal Scans  Barriers/Navigation Needs Education  Education Other  Interventions Education  Education Method Verbal  Support Groups/Services Other  Acuity Level 2  Time Spent with Patient 30

## 2018-10-29 NOTE — Telephone Encounter (Signed)
-----   Message from [name], MD sent at 10/28/2018 11:15 PM EDT ----- Creatinine has bumped on low dose Lasix.  This was started due to pleural effusion which may not be due to CHF.  Please have him come in for BNP and if normal will stop Lasix.

## 2018-10-30 ENCOUNTER — Encounter (HOSPITAL_COMMUNITY): Payer: Self-pay

## 2018-10-30 ENCOUNTER — Other Ambulatory Visit: Payer: Medicare Other

## 2018-10-30 ENCOUNTER — Other Ambulatory Visit: Payer: Self-pay

## 2018-10-30 DIAGNOSIS — Z79899 Other long term (current) drug therapy: Secondary | ICD-10-CM

## 2018-10-30 LAB — PRO B NATRIURETIC PEPTIDE: NT-Pro BNP: 1501 pg/mL — ABNORMAL HIGH (ref 0–376)

## 2018-10-31 ENCOUNTER — Encounter: Payer: Self-pay | Admitting: Thoracic Surgery (Cardiothoracic Vascular Surgery)

## 2018-10-31 ENCOUNTER — Institutional Professional Consult (permissible substitution) (INDEPENDENT_AMBULATORY_CARE_PROVIDER_SITE_OTHER): Payer: Medicare Other | Admitting: Thoracic Surgery (Cardiothoracic Vascular Surgery)

## 2018-10-31 ENCOUNTER — Other Ambulatory Visit: Payer: Self-pay | Admitting: *Deleted

## 2018-10-31 VITALS — BP 109/71 | HR 88 | Resp 20 | Ht 71.0 in | Wt 161.0 lb

## 2018-10-31 DIAGNOSIS — R911 Solitary pulmonary nodule: Secondary | ICD-10-CM

## 2018-10-31 DIAGNOSIS — I2510 Atherosclerotic heart disease of native coronary artery without angina pectoris: Secondary | ICD-10-CM | POA: Diagnosis not present

## 2018-10-31 NOTE — Progress Notes (Unsigned)
Ct

## 2018-10-31 NOTE — Progress Notes (Signed)
PCP is [name], MD Referring Provider is Rexene Alberts, MD  Chief Complaint  Patient presents with  . 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  He does have a history of tobacco abuse, but quit in 1986.  He was being evaluated for a TAVR valve in the fall 2019.  A CT of the abdomen and pelvis showed an area of airspace consolidation in the left lower lobe.  A follow-up CT in November showed persistent atelectasis at the medial left lower lobe with an area of parenchymal opacity.  Follow-up was recommended.  A repeat CT in February showed an enlarging area of masslike consolidation in the medial aspect of the superior segment of the left lower lobe measuring 3.5 x 2.2 cm.  A 6 mm right lung nodule was stable.  A PET CT was done which showed metabolic activity in the nodule.  There was some questionable activity in the left hilum as well.  There also were bilateral pleural effusions.  He had a thoracentesis on 10/19/2018.  Does not appear to have been sent for cytology.  His heart failure symptoms are relatively well controlled currently.  He can walk about 50 yards without getting short of breath.  He can get up a flight of stairs but would be very short of breath.  He has lost about 26 pounds over the past 3 months but much of that was fluid.  He had a stroke in 2013 with weakness and foot drop which resolved.  He does not currently have any headaches  or neurologic symptoms. Past Medical History:  Diagnosis Date  . Acute on chronic combined systolic and diastolic CHF (congestive heart failure) ([place]) 04/16/2018  . Anemia   . Arthritis   . Chronic kidney disease   . Chronic systolic CHF (congestive heart failure) ([place]) 07/04/2018  . Coronary artery disease 02/2012   a. s/p stenting in 2013  b. s/p CABGx2V (SVG--> PDA, SVG--> LCx).  . Diabetes mellitus    neuropathy  insulin dependent  . Dyslipidemia   . Erectile dysfunction   . GERD (gastroesophageal reflux disease)   . History of CVA (cerebrovascular accident)   . History of kidney stones   . Hx of radiation therapy to mediastinum 1985  . Hypertension   . Malignant seminoma of mediastinum ([place]) 1985  . OSA (obstructive sleep apnea) 07/04/2018   Severe obstructive sleep apnea with an AHI of 30/h and mild central sleep apnea at 13.7/h with oxygen desaturations as low as 79%  . S/P TAVR (transcatheter aortic valve replacement) 05/15/2018   29 mm Edwards Sapien 3 transcatheter heart valve placed via percutaneous right transfemoral approach   . Severe aortic stenosis     Past Surgical History:  Procedure Laterality Date  . COLONOSCOPY  07/25/2012   Procedure: COLONOSCOPY;  Surgeon: Winfield Cunas., MD;  Location: [facility] ENDOSCOPY;  Service: Endoscopy;  Laterality: N/A;  . COLONOSCOPY N/A 12/02/2013   Procedure: COLONOSCOPY;  Surgeon: Winfield Cunas., MD;  Location: [facility] ENDOSCOPY;  Service: Endoscopy;  Laterality: N/A;  . CORONARY ARTERY BYPASS GRAFT N/A 01/04/2013   Procedure: CORONARY ARTERY BYPASS GRAFTING (CABG) times two using right saphenous vein harvested with endoscope.;  Surgeon: Ivin Poot, MD;  Location: [place];  Service: Open Heart Surgery;  Laterality: N/A;  . ESOPHAGOGASTRODUODENOSCOPY  07/20/2012   Procedure: ESOPHAGOGASTRODUODENOSCOPY (EGD);  Surgeon: Winfield Cunas., MD;  Location: [facility] ENDOSCOPY;  Service: Endoscopy;  Laterality: N/A;  .  ESOPHAGOGASTRODUODENOSCOPY N/A 12/02/2013   Procedure: ESOPHAGOGASTRODUODENOSCOPY (EGD);  Surgeon: Winfield Cunas., MD;  Location: [facility] ENDOSCOPY;  Service: Endoscopy;  Laterality: N/A;  . HOT HEMOSTASIS N/A 12/02/2013   Procedure: HOT HEMOSTASIS (ARGON PLASMA COAGULATION/BICAP);  Surgeon: Winfield Cunas., MD;  Location: [facility] ENDOSCOPY;  Service: Endoscopy;  Laterality: N/A;  . INTRAOPERATIVE TRANSESOPHAGEAL ECHOCARDIOGRAM N/A 01/04/2013   Procedure: INTRAOPERATIVE TRANSESOPHAGEAL ECHOCARDIOGRAM;  Surgeon: Ivin Poot, MD;  Location: [place];  Service: Open Heart Surgery;  Laterality: N/A;  . INTRAOPERATIVE TRANSTHORACIC ECHOCARDIOGRAM N/A 05/15/2018   Procedure: INTRAOPERATIVE TRANSTHORACIC ECHOCARDIOGRAM;  Surgeon: Burnell Blanks, MD;  Location: [place];  Service: Open Heart Surgery;  Laterality: N/A;  . IR THORACENTESIS ASP PLEURAL SPACE W/IMG GUIDE  10/19/2018  . LEFT HEART CATHETERIZATION WITH CORONARY ANGIOGRAM N/A 03/06/2012   Procedure: LEFT HEART CATHETERIZATION WITH CORONARY ANGIOGRAM;  Surgeon: [name], MD;  Location: [place] CATH LAB;  Service: Cardiovascular;  Laterality: N/A;  . LITHOTRIPSY    . [place]  . RADIAL ARTERY HARVEST Left 01/04/2013   Procedure: RADIAL ARTERY HARVEST;  Surgeon: Ivin Poot, MD;  Location: [place];  Service: Vascular;  Laterality: Left;  Artery not havested. Unsuitable for use.  . resection mediastinal seminonma    . RIGHT/LEFT HEART CATH AND CORONARY/GRAFT ANGIOGRAPHY N/A 11/15/2016   Procedure: Right/Left Heart Cath and Coronary/Graft Angiography;  Surgeon: Leonie Man, MD;  Location: [place] CV LAB;  Service: Cardiovascular;  Laterality: N/A;  . RIGHT/LEFT HEART CATH AND CORONARY/GRAFT ANGIOGRAPHY N/A 04/13/2018   Procedure: RIGHT/LEFT HEART CATH AND CORONARY/GRAFT ANGIOGRAPHY;  Surgeon: Jolaine Artist, MD;  Location: [place] CV LAB;  Service: Cardiovascular;  Laterality: N/A;  . TRANSCATHETER AORTIC VALVE  REPLACEMENT, TRANSFEMORAL  05/15/2018  . TRANSCATHETER AORTIC VALVE REPLACEMENT, TRANSFEMORAL N/A 05/15/2018   Procedure: TRANSCATHETER AORTIC VALVE REPLACEMENT, TRANSFEMORAL;  Surgeon: Burnell Blanks, MD;  Location: [place];  Service: Open Heart Surgery;  Laterality: N/A;    Family History  Problem Relation Age of Onset  . Diabetes Father   . Diabetes Mother   . Heart failure Mother   . Hypertension Mother   . Cancer Brother   . Diabetes Sister     Social History Social History   Tobacco Use  . Smoking status: Former Smoker    Last attempt to quit: 03/06/1985    Years since quitting: 33.6  . Smokeless tobacco: Never Used  Substance Use Topics  . Alcohol use: No  . Drug use: No    Current Outpatient Medications  Medication Sig Dispense Refill  . amitriptyline (ELAVIL) 100 MG tablet Take 100 mg by mouth daily.     Marland Kitchen amoxicillin (AMOXIL) 500 MG capsule Take 1 capsule (500 mg total) by mouth as directed. Take 4 tablets (2000 mg ) 1 hour prior to dental procedures. 12 capsule 12  . aspirin 81 MG chewable tablet Chew 1 tablet (81 mg total) by mouth daily.    . B Complex-C (SUPER B COMPLEX PO) Take 1  tablet by mouth daily.    . B-D ULTRAFINE III SHORT PEN 31G X 8 MM MISC 1 each by Other route daily as needed (GLUCOSE TESTING (INSULINE NEEDLE)).     . Cholecalciferol (VITAMIN D-3) 5000 units TABS Take 5,000 Units by mouth daily.    . clopidogrel (PLAVIX) 75 MG tablet Take 1 tablet (75 mg total) by mouth daily with breakfast. 90 tablet 1  . furosemide (LASIX) 20 MG tablet Take 1 tablet (20 mg total) by mouth daily. 30 tablet 11  . HUMALOG KWIKPEN 100 UNIT/ML KiwkPen Per pump;  0.8 unit(s)/per hour average and patient gives a bolus, if needed    . hydroxypropyl methylcellulose / hypromellose (ISOPTO TEARS / GONIOVISC) 2.5 % ophthalmic solution Place 1 drop into both eyes 4 (four) times daily as needed for dry eyes.    Marland Kitchen levothyroxine (SYNTHROID, LEVOTHROID) 50 MCG tablet Take 50  mcg by mouth daily before breakfast. Per Dr. Chalmers Cater - 11/4  Pt instructed to take 1 1/2 every day Monday thru Saturday.  On Sunday take 2 tablets.    Marland Kitchen losartan (COZAAR) 25 MG tablet Take 0.5 tablets (12.5 mg total) by mouth daily. 14 tablet 0  . metFORMIN (GLUCOPHAGE) 500 MG tablet Take 2 tablets (1,000 mg total) by mouth 2 (two) times daily with a meal.    . metoprolol succinate (TOPROL-XL) 50 MG 24 hr tablet Take 75 mg by mouth daily. Take with or immediately following a meal.    . Multiple Vitamin (MULTIVITAMIN) tablet Take 1 tablet by mouth daily.    . ONE TOUCH ULTRA TEST test strip 1 each by Other route as needed for other.     . simvastatin (ZOCOR) 40 MG tablet Take 40 mg by mouth every evening.    . zolpidem (AMBIEN) 10 MG tablet Take 10 mg by mouth daily.      No current facility-administered medications for this visit.     Allergies  Allergen Reactions  . Lipitor [Atorvastatin] Other (See Comments)    Muscle pain  . Adhesive [Tape] Rash    Review of Systems  Constitutional: Positive for unexpected weight change. Negative for activity change and appetite change.  HENT: Negative for trouble swallowing and voice change.   Eyes: Negative for visual disturbance.  Respiratory: Positive for shortness of breath. Negative for chest tightness.   Cardiovascular: Positive for leg swelling (Well-controlled currently). Negative for chest pain.  Gastrointestinal: Negative for abdominal distention and abdominal pain.  Genitourinary: Negative for difficulty urinating and dysuria.  Musculoskeletal: Positive for arthralgias. Negative for myalgias.  Neurological: Negative for weakness and headaches.  Hematological: Negative for adenopathy. Bruises/bleeds easily.    BP 109/71   Pulse 88   Resp 20   Ht 5\' 11"  (1.803 m)   Wt 161 lb (73 kg)   SpO2 97% Comment: RA  BMI 22.45 kg/m  Physical Exam Vitals signs reviewed.  Constitutional:      General: He is not in acute distress.     Appearance: Normal appearance.  HENT:     Head: Normocephalic and atraumatic.     Mouth/Throat:     Pharynx: Oropharynx is clear.  Eyes:     General: No scleral icterus.    Extraocular Movements: Extraocular movements intact.     Conjunctiva/sclera: Conjunctivae normal.  Neck:     Musculoskeletal: Neck supple. No neck rigidity.  Cardiovascular:     Rate and Rhythm: Normal rate and regular rhythm.     Heart sounds: Murmur (2/6 systolic) present.  Pulmonary:     Effort: Pulmonary effort is normal. No respiratory distress.     Breath sounds: Normal breath sounds. No wheezing or rales.  Abdominal:     General: There is no distension.     Palpations: Abdomen is soft.     Tenderness: There is no abdominal tenderness.  Musculoskeletal:        General: No swelling.  Skin:    General: Skin is warm and dry.  Neurological:     General: No focal deficit present.     Mental Status: He is alert and oriented to person, place, and time.     Cranial Nerves: No cranial nerve deficit.     Gait: Gait normal.    Diagnostic Tests: NUCLEAR MEDICINE PET SKULL BASE TO THIGH  TECHNIQUE: 8.2 mCi F-18 FDG was injected intravenously. Full-ring PET imaging was performed from the skull base to thigh after the radiotracer. CT data was obtained and used for attenuation correction and anatomic localization.  Fasting blood glucose: 208 mg/dl  COMPARISON:  CT scan 10/03/2018  FINDINGS: Mediastinal blood pool activity: SUV max 2.4  NECK: No significant abnormal hypermetabolic activity in this region.  Incidental CT findings: Mucous retention cyst of the left maxillary sinus. Bilateral common carotid artery atherosclerotic calcification. Small nodules in the thyroid gland are not hypermetabolic.  CHEST:  The left lower lobe mass measures approximately 3.6 by 1.9 cm and has maximum SUV of 4.3. Subtle accentuated left hilar activity without a well-defined lymph node, maximum SUV 2.7, just  above the blood pool.  Postoperative findings in the anterior mediastinum with multiple clips in the region, indistinct density, maximum SUV 3.3.  Incidental CT findings: Moderate bilateral pleural effusions. Coronary, aortic arch, and branch vessel atherosclerotic vascular disease. Aortic valve prosthesis. Mild cardiomegaly. 6 by 4 mm right upper lobe pulmonary nodule just above the minor fissure on image 32/8, no appreciable hypermetabolic activity, this lesion is below sensitive PET-CT size thresholds. Passive atelectasis both lungs noted.  ABDOMEN/PELVIS: Scattered physiologic activity in bowel, including a segment of what appears to be small bowel adjacent to the left renal hilum which has no abnormal CT characteristic and accordingly is likely incidental.  Incidental CT findings: Aortoiliac atherosclerotic vascular disease. 9 mm stone in the right kidney lower pole. Prominence of stool in the rectal vault. Scattered calcifications along the tunic albuginea of the penis.  SKELETON: No significant abnormal hypermetabolic activity in this region.  Incidental CT findings: Degenerative findings at the left sternoclavicular joint.  IMPRESSION: 1. Hypermetabolic 3.6 by 1.9 cm left lower lobe mass, maximum SUV 4.3, suspicious for potential adenocarcinoma. Tissue diagnosis recommended. 2. Small focus of accentuated activity along the left hilum could represent early metastatic spread but may be incidental. Maximum SUV in this vicinity is 2.7, just above the blood pool level which is 2.4. 3. Moderate bilateral pleural effusions, cause uncertain, increased in size compared to 10/03/2018. 4. The 6 mm right upper lobe lesion is not hypermetabolic but is below sensitive PET-CT size thresholds. 5. Postoperative findings and low-grade activity in the anterior mediastinum, not entirely specific but likely related to prior CABG. 6. Other imaging findings of potential clinical  significance: Nonobstructive right nephrolithiasis. Aortic Atherosclerosis (ICD10-I70.0). Coronary atherosclerosis. Aortic valve prosthesis. Prominence of stool in the rectal vault. Scattered calcifications along the tunica albuginea of the penis. Mucous retention cyst in left maxillary sinus. Degenerative left sternoclavicular joint.   Electronically Signed   By: Van Clines M.D.   On: 10/17/2018 09:34 I personally  reviewed the PET/CT as well as the CT scans from February and November.  I concur with the findings noted above.  Impression: [name] is a 68 year old gentleman with a complex medical history.  He has a remote history of tobacco abuse.  He was first noted to have an the superior segment of the left lower lobe on an abdominal CT done back in 6 December.  He has had follow-up CT scans which show persistence and his most recent CT actually showed an increase in size.  On PET CT this area is mildly hypermetabolic with an SUV of 4.3.  There is some questionable activity in the left hilum but that is not impressive.  The mass itself is highly concerning for a primary bronchogenic carcinoma although an inflammatory mass such as BOOP is another possibility.  I discussed 2 potential options for diagnosis.  The first would be a CT-guided biopsy.  The primary advantage would be the avoidance of general anesthesia.  The disadvantage is inability to assess the hilar nodes.  Second option would be navigational bronchoscopy and endobronchial ultrasound.  That would allow assessment of the hilar nodes, but would require general anesthesia with its associated risks.  We discussed the relative advantages and disadvantages of each approach.  His wife is very reluctant for him to have general anesthesia.  Given the current climate with limitations on bronchoscopy and surgical procedures, a CT-guided biopsy likely could be done sooner.  He will need to hold his Plavix for 5 days prior to either  biopsy so the earliest that can be done would be next week.  They wish to proceed with a CT-guided biopsy.  I will have my office arrange that.  I will also check with Dr. Radford Pax to make sure that it is safe to hold his Plavix prior to the procedure.  Plan: CT-guided needle biopsy left lower lobe mass. Return in 2 weeks to discuss results  Melrose Nakayama, MD [facility] [phone]

## 2018-11-01 ENCOUNTER — Telehealth: Payer: Self-pay

## 2018-11-01 ENCOUNTER — Encounter (HOSPITAL_COMMUNITY): Payer: Self-pay

## 2018-11-01 ENCOUNTER — Encounter: Payer: Self-pay | Admitting: Thoracic Surgery (Cardiothoracic Vascular Surgery)

## 2018-11-01 DIAGNOSIS — R7989 Other specified abnormal findings of blood chemistry: Secondary | ICD-10-CM

## 2018-11-01 DIAGNOSIS — I5022 Chronic systolic (congestive) heart failure: Secondary | ICD-10-CM

## 2018-11-01 NOTE — Telephone Encounter (Signed)
-----   Message from [name], MD sent at 10/30/2018  6:32 PM EDT ----- BNP is elevated - please increase Lasix to 40mg  BID for 2 days and then down to 40mg  daily.  He will need a BMET on Friday

## 2018-11-01 NOTE — Progress Notes (Unsigned)
Per Dr. Radford Pax- can safely come off plavix 4/1  Remo Lipps C. Roxan Hockey, MD [facility] [phone]

## 2018-11-01 NOTE — Telephone Encounter (Signed)
Please write a letter stating that patient is high risk to continue work at this time due to the coronavirus.  He has significant cardiac disease and is high risk for morbidity and mortality if exposed to coronavirus through his work.   Also please let PharmD know that he wants to get [facility] and have them do any paperwork that needs to be done with his insurance

## 2018-11-01 NOTE — Telephone Encounter (Signed)
Spoke with the patient's wife, she stated that she did not want to increase his lasix since his Kidney function is elevated. She stated that she would rather look into other options to help. They saw the pharmacist on 10/24/18. To determine if Delene Loll was right, they reason they did not pursue was the cost. The wife said that they are able to pay for the medication and would like to reconsider that option. She also requested a letter to remove the patient from work.

## 2018-11-02 ENCOUNTER — Encounter (HOSPITAL_COMMUNITY): Payer: Self-pay

## 2018-11-02 ENCOUNTER — Encounter: Payer: Self-pay | Admitting: Cardiology

## 2018-11-02 MED ORDER — FUROSEMIDE 40 MG PO TABS: ORAL_TABLET | ORAL | 3 refills | Status: DC

## 2018-11-02 NOTE — Telephone Encounter (Signed)
Spoke with Claiborne Billings, PharmD, she stated the patient would be fine to start [facility], I would need a verbal order.

## 2018-11-02 NOTE — Telephone Encounter (Signed)
° ° °  Pt c/o Shortness Of Breath: STAT if SOB developed within the last 24 hours or pt is noticeably SOB on the phone  1. Are you currently SOB (can you hear that pt is SOB on the phone)? NO  2. How long have you been experiencing SOB? 2-3 DAYS GETTING WORSE  3. Are you SOB when sitting or when up moving around? MOVING AROUND  4. Are you currently experiencing any other symptoms? NO

## 2018-11-02 NOTE — Telephone Encounter (Signed)
Spoke with the patient's wife, she expressed understanding about Dr. Theodosia Blender recommendations. The patient has been feeling more SOB lately, so they decided to increase lasix as previously discussed. The patient is coming in on Monday to get his work note and BMET. 11/05/18.

## 2018-11-02 NOTE — Telephone Encounter (Signed)
See other encounter.

## 2018-11-03 NOTE — Telephone Encounter (Signed)
Start Entresto 24-26mg  BID and stop losartan.  Please postpone BMET scheduled for Monday and set up for 3/27 to reduce exposure to COVID-19 since he is high risk.

## 2018-11-05 ENCOUNTER — Other Ambulatory Visit: Payer: Medicare Other | Admitting: *Deleted

## 2018-11-05 ENCOUNTER — Other Ambulatory Visit: Payer: Self-pay

## 2018-11-05 DIAGNOSIS — R7989 Other specified abnormal findings of blood chemistry: Secondary | ICD-10-CM

## 2018-11-05 LAB — BASIC METABOLIC PANEL
BUN/Creatinine Ratio: 19 (ref 10–24)
BUN: 24 mg/dL (ref 8–27)
CO2: 23 mmol/L (ref 20–29)
Calcium: 8.8 mg/dL (ref 8.6–10.2)
Chloride: 103 mmol/L (ref 96–106)
Creatinine, Ser: 1.27 mg/dL (ref 0.76–1.27)
GFR calc Af Amer: 66 mL/min/{1.73_m2} (ref >59)
GFR, EST NON AFRICAN AMERICAN: 57 mL/min/{1.73_m2} — AB (ref >59)
Glucose: 162 mg/dL — ABNORMAL HIGH (ref 65–99)
Potassium: 4.4 mmol/L (ref 3.5–5.2)
Sodium: 142 mmol/L (ref 134–144)

## 2018-11-05 MED ORDER — SACUBITRIL-VALSARTAN 24-26 MG PO TABS: 1.0000 | ORAL_TABLET | Freq: Two times a day (BID) | ORAL | 3 refills | Status: DC

## 2018-11-05 NOTE — Telephone Encounter (Signed)
Faith patient's wife returned call.

## 2018-11-05 NOTE — Telephone Encounter (Signed)
Spoke with the patient's wife, expressed understanding about starting Entresto. They will start the medication on Wednesday, after 48 hours off of losartan. The patient got a BMET on 11/05/18. The wife wanted to know if he should still continue to take the higher dose of lasix. She also stated that Dr. Radford Pax mentioned stopping metoprolol once Delene Loll was started.

## 2018-11-05 NOTE — Telephone Encounter (Signed)
Spoke with the patient's wife, she expressed understanding about starting Entresto. A repeat BMET will be done on Friday. They had no further questions.

## 2018-11-05 NOTE — Telephone Encounter (Signed)
Attempted, left message to call back. 

## 2018-11-05 NOTE — Telephone Encounter (Signed)
He needs to stay on metoprolol with Entresto and he does not need to hold Losartan for 48 hours prior to starting [facility] - that is only for ACE I (Losartan is an ARB and part of Entresto is Valsartan which is an ARB).  He can just switch from Losartan to [facility].  I would like him to stay on current dose of Lasix until we get BMET back on Friday and make sure it is run stat

## 2018-11-06 ENCOUNTER — Encounter (HOSPITAL_COMMUNITY): Payer: Self-pay

## 2018-11-06 ENCOUNTER — Telehealth (HOSPITAL_COMMUNITY): Payer: Self-pay | Admitting: Family Medicine

## 2018-11-08 ENCOUNTER — Encounter (HOSPITAL_COMMUNITY): Payer: Self-pay

## 2018-11-09 ENCOUNTER — Telehealth: Payer: Self-pay

## 2018-11-09 ENCOUNTER — Other Ambulatory Visit: Payer: Self-pay

## 2018-11-09 ENCOUNTER — Encounter: Payer: Medicare Other | Admitting: Cardiology

## 2018-11-09 ENCOUNTER — Other Ambulatory Visit: Payer: Medicare Other | Admitting: *Deleted

## 2018-11-09 ENCOUNTER — Encounter (HOSPITAL_COMMUNITY): Payer: Self-pay

## 2018-11-09 DIAGNOSIS — R7989 Other specified abnormal findings of blood chemistry: Secondary | ICD-10-CM

## 2018-11-09 DIAGNOSIS — I5022 Chronic systolic (congestive) heart failure: Secondary | ICD-10-CM | POA: Diagnosis not present

## 2018-11-09 LAB — BASIC METABOLIC PANEL
BUN / CREAT RATIO: 22 (ref 10–24)
BUN: 31 mg/dL — ABNORMAL HIGH (ref 8–27)
CO2: 24 mmol/L (ref 20–29)
Calcium: 8.6 mg/dL (ref 8.6–10.2)
Chloride: 102 mmol/L (ref 96–106)
Creatinine, Ser: 1.4 mg/dL — ABNORMAL HIGH (ref 0.76–1.27)
GFR calc Af Amer: 59 mL/min/{1.73_m2} — ABNORMAL LOW (ref >59)
GFR calc non Af Amer: 51 mL/min/{1.73_m2} — ABNORMAL LOW (ref >59)
Glucose: 264 mg/dL — ABNORMAL HIGH (ref 65–99)
Potassium: 4.2 mmol/L (ref 3.5–5.2)
SODIUM: 134 mmol/L (ref 134–144)

## 2018-11-09 NOTE — Telephone Encounter (Signed)
TELEPHONE CALL NOTE  This patient has been deemed a candidate for follow-up tele-health visit to limit community exposure during the Covid-19 pandemic. I spoke with the patient via phone to discuss instructions. This has been outlined on the patient's AVS (dotphrase: hcevisitinfo). The patient was advised to review the section on consent for treatment as well. The patient will receive a phone call 2-3 days prior to their E-Visit at which time consent will be verbally confirmed. A Virtual Office Visit appointment type has been scheduled for 1:00 pm on 11/09/18 with Dr.Turner.  Sarina Ill, RN 11/09/2018 8:31 AM    In the setting of the current Covid19 crisis, you are scheduled for a (phone or video) visit with your provider on (date) at (time).  Just as we do with many in-office visits, in order for you to participate in this visit, we must obtain consent.  If you'd like, I can send this to your mychart (if signed up) or email for you to review.  Otherwise, I can obtain your verbal consent now.  All virtual visits are billed to your insurance company just like a normal visit would be.  By agreeing to a virtual visit, we'd like you to understand that the technology does not allow for your provider to perform an examination, and thus may limit your provider's ability to fully assess your condition.  Finally, though the technology is pretty good, we cannot assure that it will always work on either your or our end, and in the setting of a video visit, we may have to convert it to a phone-only visit.  In either situation, we cannot ensure that we have a secure connection.  Are you willing to proceed?  TELEPHONE CALL NOTE  [name] has been deemed a candidate for a follow-up tele-health visit to limit community exposure during the Covid-19 pandemic. I spoke with the patient via phone to ensure availability of phone/video source, confirm preferred email & phone number, and discuss instructions and  expectations.  I reminded [name] to be prepared with any vital sign and/or heart rhythm information that could potentially be obtained via home monitoring, at the time of his visit. I reminded [name] to expect an e-mail containing a link for their video-based visit approximately 15 minutes before his visit, or alternatively, a phone call at the time of his visit if his visit is planned to be a phone encounter.  STAFF MUST READ CONSENT VERBATIM TO PATIENT BELOW - Did the patient verbally consent to treatment as below? [place], RN 11/09/2018 8:32 AM  DOWNLOADING THE SOFTWARE (If applicable)  Download the [employer] app to enable video and telephone visits with your [facility] Provider.   Instructions for downloading [employer] WebEx: - Go to https://www.webex.com/downloads.html and follow the instructions - If you have technical difficulties with downloading WebEx, please call WebEx at [phone]. - Once the app is downloaded (can be done on either mobile or desktop computer), go to Settings in the upper left hand corner.  Be sure that camera and audio are enabled.  - You will receive an email message with a link to the meeting with a time to join for your tele-health visit.  - Please download the app and have settings configured prior to the appointment time.    CONSENT FOR TELE-HEALTH VISIT - PLEASE REVIEW  I hereby voluntarily request, consent and authorize [facility] and its employed or contracted physicians, Engineer, materials, nurse practitioners  or other licensed health care professionals (the Practitioner), to provide me with telemedicine health care services (the "Services") as deemed necessary by the treating Practitioner. I acknowledge and consent to receive the Services by the Practitioner via telemedicine. I understand that the telemedicine visit will involve communicating with the Practitioner through live audiovisual communication technology and  the disclosure of certain medical information by electronic transmission. I acknowledge that I have been given the opportunity to request an in-person assessment or other available alternative prior to the telemedicine visit and am voluntarily participating in the telemedicine visit.  I understand that I have the right to withhold or withdraw my consent to the use of telemedicine in the course of my care at any time, without affecting my right to future care or treatment, and that the Practitioner or I may terminate the telemedicine visit at any time. I understand that I have the right to inspect all information obtained and/or recorded in the course of the telemedicine visit and may receive copies of available information for a reasonable fee.  I understand that some of the potential risks of receiving the Services via telemedicine include:  Marland Kitchen Delay or interruption in medical evaluation due to technological equipment failure or disruption; . Information transmitted may not be sufficient (e.g. poor resolution of images) to allow for appropriate medical decision making by the Practitioner; and/or  . In rare instances, security protocols could fail, causing a breach of personal health information.  Furthermore, I acknowledge that it is my responsibility to provide information about my medical history, conditions and care that is complete and accurate to the best of my ability. I acknowledge that Practitioner's advice, recommendations, and/or decision may be based on factors not within their control, such as incomplete or inaccurate data provided by me or distortions of diagnostic images or specimens that may result from electronic transmissions. I understand that the practice of medicine is not an exact science and that Practitioner makes no warranties or guarantees regarding treatment outcomes. I acknowledge that I will receive a copy of this consent concurrently upon execution via email to the email address I  last provided but may also request a printed copy by calling the office of [place].    I understand that my insurance will be billed for this visit.   I have read or had this consent read to me. . I understand the contents of this consent, which adequately explains the benefits and risks of the Services being provided via telemedicine.  . I have been provided ample opportunity to ask questions regarding this consent and the Services and have had my questions answered to my satisfaction. . I give my informed consent for the services to be provided through the use of telemedicine in my medical care  By participating in this telemedicine visit I agree to the above.

## 2018-11-09 NOTE — Telephone Encounter (Signed)
The patient rescheduled there Tele-Health Visit to 2:20 pm on 11/13/18.

## 2018-11-09 NOTE — Progress Notes (Signed)
  This encounter was created in error - please disregard This encounter was created in error - please disregard. This encounter was created in error - please disregard. This encounter was created in error - please disregard. This encounter was created in error - please disregard.

## 2018-11-12 NOTE — Progress Notes (Signed)
Virtual Visit via Video Note    Evaluation Performed:  Follow-up visit  This visit type was conducted due to national recommendations for restrictions regarding the COVID-19 Pandemic (e.g. social distancing).  This format is felt to be most appropriate for this patient at this time.  All issues noted in this document were discussed and addressed.  No physical exam was performed (except for noted visual exam findings with Video Visits).  Please refer to the patient's chart (MyChart message for video visits and phone note for telephone visits) for the patient's consent to telehealth for [facility].  Date:  11/14/2018   ID:  [name], DOB [birth year], MRN [id number]  Patient Location:  Home  Provider location:   [place]  PCP:  [name], MD  Cardiologist:  Fransico Him, MD  Electrophysiologist:  None   Chief Complaint:  CHF, CAD followup  History of Present Illness:    [name] is a 68 y.o. male who presents via audio/video conferencing for a telehealth visit today.    [name] is a 68 y.o. male with a hx of CAD, aortic stenosis and systolic HF. Hehad anon-STEMI in 2013 treated with PCI of the LCx/OM. LHC in 5/14 demonstrated severe 2 vessel CAD with critical in-stent restenosis in the LCx. He underwent CABG (SVG-PDA, SVG-LCx). He also has a history of aortic stenosis. Echo 10/2016 showed moderately reduced LVF at 35-40% with diffuse HK, mild AS and moderate AR. Right and Left heart cath showed mild to moderate AS with AVA 0.837cm2 and EF 45% with patent SVG to RPDA and SVG to OM1 With occluded native vessels. He ultimately was diagnosed with low gradient aortic stenosis with AI and underwent TAVR with a 29 mm EdwardsSapein3 THV via transfemoral approach after being admitted with acute exacerbation of CHF on 05/15/2018.   He also was recently dx with severe obstructive sleep apnea with an AHI of 30/h and mild central sleep apnea at 13.7/h with oxygen  desaturations as low as 79%. He underwent CPAP titration but was not successful due to ongoing respiratory events and was ultimately ordered on BiPAP therapy at 16/12 centimeters H2O.  When I saw him last he was having problems with his mask and I ordered him a Respironics Dreamware under the nose full face mask.   A left lung mass this was noted on cxray and Chest C showed enlarging irregular mass like focus of consolidation in the LLL 3.5 x 2.2cm suspicious for malignancy.  PET scan revealed an area of hypermetabolic activity worrisome for adenoCA.  He underwent thoracentesis which was nondiagnostic.   He is scheduled for CT guided lung bx tomorrow with Radiology.   The patient does not symptoms concerning for COVID-19 infection (fever, chills, cough, or new shortness of breath).    Prior CV studies:   The following studies were reviewed today:  2D echo  Past Medical History:  Diagnosis Date   Acute on chronic combined systolic and diastolic CHF (congestive heart failure) ([place]) 04/16/2018   Anemia    Arthritis    Chronic kidney disease    Chronic systolic CHF (congestive heart failure) ([place]) 07/04/2018   Coronary artery disease 02/2012   a. s/p stenting in 2013  b. s/p CABGx2V (SVG--> PDA, SVG--> LCx).   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   OSA (obstructive sleep apnea) 07/04/2018   Severe obstructive sleep apnea with an AHI of 30/h and mild central sleep apnea at 13.7/h with oxygen desaturations as low as 79%   S/P TAVR (transcatheter aortic valve replacement) 05/15/2018   29 mm Edwards Sapien 3 transcatheter heart valve placed via percutaneous right transfemoral approach    Severe aortic  stenosis    Past Surgical History:  Procedure Laterality Date   COLONOSCOPY  07/25/2012   Procedure: COLONOSCOPY;  Surgeon: Winfield Cunas., MD;  Location: [facility] ENDOSCOPY;  Service: Endoscopy;  Laterality: N/A;   COLONOSCOPY N/A 12/02/2013   Procedure: COLONOSCOPY;  Surgeon: Winfield Cunas., MD;  Location: [facility] ENDOSCOPY;  Service: Endoscopy;  Laterality: N/A;   CORONARY ARTERY BYPASS GRAFT N/A 01/04/2013   Procedure: CORONARY ARTERY BYPASS GRAFTING (CABG) times two using right saphenous vein harvested with endoscope.;  Surgeon: Ivin Poot, MD;  Location: [place];  Service: Open Heart Surgery;  Laterality: N/A;   ESOPHAGOGASTRODUODENOSCOPY  07/20/2012   Procedure: ESOPHAGOGASTRODUODENOSCOPY (EGD);  Surgeon: Winfield Cunas., MD;  Location: [facility] ENDOSCOPY;  Service: Endoscopy;  Laterality: N/A;   ESOPHAGOGASTRODUODENOSCOPY N/A 12/02/2013   Procedure: ESOPHAGOGASTRODUODENOSCOPY (EGD);  Surgeon: Winfield Cunas., MD;  Location: [facility];  Service: Endoscopy;  Laterality: N/A;   HOT HEMOSTASIS N/A 12/02/2013   Procedure: HOT HEMOSTASIS (ARGON PLASMA COAGULATION/BICAP);  Surgeon: Winfield Cunas., MD;  Location: [facility] ENDOSCOPY;  Service: Endoscopy;  Laterality: N/A;   INTRAOPERATIVE TRANSESOPHAGEAL ECHOCARDIOGRAM N/A 01/04/2013   Procedure: INTRAOPERATIVE TRANSESOPHAGEAL ECHOCARDIOGRAM;  Surgeon: Ivin Poot, MD;  Location: [place];  Service: Open Heart Surgery;  Laterality: N/A;   INTRAOPERATIVE TRANSTHORACIC ECHOCARDIOGRAM N/A 05/15/2018   Procedure: INTRAOPERATIVE TRANSTHORACIC ECHOCARDIOGRAM;  Surgeon: Burnell Blanks, MD;  Location: [place];  Service: Open Heart Surgery;  Laterality: N/A;   IR THORACENTESIS ASP PLEURAL SPACE W/IMG GUIDE  10/19/2018   LEFT HEART CATHETERIZATION WITH CORONARY ANGIOGRAM N/A 03/06/2012   Procedure: LEFT HEART CATHETERIZATION WITH CORONARY ANGIOGRAM;  Surgeon: [name], MD;  Location: [place] CATH LAB;  Service: Cardiovascular;  Laterality:  N/A;   LITHOTRIPSY     [place] Left 01/04/2013   Procedure: RADIAL ARTERY HARVEST;  Surgeon: Ivin Poot, MD;  Location: [place];  Service: Vascular;  Laterality: Left;  Artery not havested. Unsuitable for use.   resection mediastinal seminonma     RIGHT/LEFT HEART CATH AND CORONARY/GRAFT ANGIOGRAPHY N/A 11/15/2016   Procedure: Right/Left Heart Cath and Coronary/Graft Angiography;  Surgeon: Leonie Man, MD;  Location: [place] CV LAB;  Service: Cardiovascular;  Laterality: N/A;   RIGHT/LEFT HEART CATH AND CORONARY/GRAFT ANGIOGRAPHY N/A 04/13/2018   Procedure: RIGHT/LEFT HEART CATH AND CORONARY/GRAFT ANGIOGRAPHY;  Surgeon: Jolaine Artist, MD;  Location: [place] CV LAB;  Service: Cardiovascular;  Laterality: N/A;   TRANSCATHETER AORTIC VALVE REPLACEMENT, TRANSFEMORAL  05/15/2018   TRANSCATHETER AORTIC VALVE REPLACEMENT, TRANSFEMORAL N/A 05/15/2018   Procedure: TRANSCATHETER AORTIC VALVE REPLACEMENT, TRANSFEMORAL;  Surgeon: Burnell Blanks, MD;  Location: [place];  Service: Open Heart Surgery;  Laterality: N/A;     Current Meds  Medication Sig   amitriptyline (ELAVIL) 25 MG tablet Take 25 mg by mouth at bedtime.    amoxicillin (AMOXIL) 500 MG capsule Take 1 capsule (500 mg total) by mouth as  directed. Take 4 tablets (2000 mg ) 1 hour prior to dental procedures.   aspirin 81 MG chewable tablet Chew 1 tablet (81 mg total) by mouth daily.   B Complex-C (SUPER B COMPLEX PO) Take 1 tablet by mouth daily.   B-D ULTRAFINE III SHORT PEN 31G X 8 MM MISC 1 each by Other route daily as needed (GLUCOSE TESTING (INSULINE NEEDLE)).    Cholecalciferol (VITAMIN D-3) 5000 units TABS Take 5,000 Units by mouth daily.   clopidogrel (PLAVIX) 75 MG tablet Take 1 tablet (75 mg total) by mouth daily with breakfast.   hydroxypropyl methylcellulose / hypromellose (ISOPTO TEARS / GONIOVISC) 2.5 % ophthalmic solution Place 1 drop into both eyes 4  (four) times daily as needed for dry eyes.   insulin detemir (LEVEMIR) 100 UNIT/ML injection Inject 40 Units into the skin every morning.   levothyroxine (SYNTHROID, LEVOTHROID) 50 MCG tablet Take 50 mcg by mouth daily before breakfast.    metFORMIN (GLUCOPHAGE) 500 MG tablet Take 2 tablets (1,000 mg total) by mouth 2 (two) times daily with a meal.   Multiple Vitamin (MULTIVITAMIN) tablet Take 1 tablet by mouth daily.   ONE TOUCH ULTRA TEST test strip 1 each by Other route as needed for other.    simvastatin (ZOCOR) 20 MG tablet Take 20 mg by mouth every evening.    zolpidem (AMBIEN) 10 MG tablet Take 10 mg by mouth at bedtime.    [DISCONTINUED] furosemide (LASIX) 40 MG tablet Take 40 mg, twice a day, by mouth for 2 days, then Lasix 40 mg, daily, by mouth (Patient taking differently: Take 40 mg by mouth daily. )   [DISCONTINUED] metoprolol succinate (TOPROL-XL) 50 MG 24 hr tablet Take 75 mg by mouth daily. Take with or immediately following a meal.   [DISCONTINUED] sacubitril-valsartan (ENTRESTO) 24-26 MG Take 1 tablet by mouth 2 (two) times daily.     Allergies:   Lipitor [atorvastatin] and Adhesive [tape]   Social History   Tobacco Use   Smoking status: Former Smoker    Last attempt to quit: 03/06/1985    Years since quitting: 33.7   Smokeless tobacco: Never Used  Substance Use Topics   Alcohol use: No   Drug use: No     Family Hx: The patient's family history includes Cancer in his brother; Diabetes in his father, mother, and sister; Heart failure in his mother; Hypertension in his mother.  ROS:   Please see the history of present illness.     All other systems reviewed and are negative.   Labs/Other Tests and Data Reviewed:    Recent Labs: 05/11/2018: B Natriuretic Peptide 740.6 05/16/2018: Magnesium 1.9; TSH 3.157 10/25/2018: ALT 33 10/30/2018: NT-Pro BNP 1,501 11/09/2018: BUN 31; Creatinine, Ser 1.40; Potassium 4.2; Sodium 134 11/14/2018: Hemoglobin 15.5;  Platelets 207   Recent Lipid Panel Lab Results  Component Value Date/Time   CHOL 95 04/12/2018 02:16 AM   CHOL 104 03/28/2018 08:50 AM   TRIG 36 04/12/2018 02:16 AM   HDL 33 (L) 04/12/2018 02:16 AM   HDL 35 (L) 03/28/2018 08:50 AM   CHOLHDL 2.9 04/12/2018 02:16 AM   LDLCALC 55 04/12/2018 02:16 AM   LDLCALC 58 03/28/2018 08:50 AM    Wt Readings from Last 3 Encounters:  11/14/18 151 lb (68.5 kg)  11/13/18 151 lb (68.5 kg)  10/31/18 161 lb (73 kg)     Objective:    Vital Signs:  BP 90/60    Pulse 100    Resp 20  Ht 5\' 11"  (1.803 m)    Wt 151 lb (68.5 kg)    BMI 21.06 kg/m    Well nourished, well developed male in no acute distress. Well appearing, alert and conversant, regular work of breathing,  good skin color  Eyes- anicteric mouth- oral mucosa is pink  neuro- grossly intact skin- no apparent rash or lesions or cyanosis   ASSESSMENT & PLAN:    1. OSA - the patient is tolerating PAP therapy well without any problems. The PAP download was reviewed today and showed an AHI of 5.1/hr on 16/12 cm H2O with 37% compliance in using more than 4 hours nightly.  The patient has been using and benefiting from PAP use and will continue to benefit from therapy. I encouraged him to be more compliant with his PAP device.   2.  Chronic systolic CHF - He recently had some problems with SOB and a BNP was elevated at 1501.  His Lasix was increased to 40mg  BID for 2 days and then down to 20mg  daily from his usual daily does of 20mg  daily.  He has dropped 11lbs since then and his BP has been running as low as 80/55mmHg and he has been feeling bad with severe fatigue and today had nausea and vomiting after his BP dropped.  He was recently started on Entresto 24-26mg  BID. He has not had any further LE edema or SOB since dropping 11lbs.  His baseline weight is 162lbs and he is down to 151lbs.  His echo in 2019 showed moderate to severely reduced LV function with EF 30 to 35% with hypokinesis of the  anterior septum and inferior wall.   I have instructed him to hold his metoprolol today and then decrease to 50mg  starting tomorrow. His HR runs on the high side so he needs the BB.   He will hold his lasix for 2 days and then restart at 20mg  daily.  I have instructed him to hold Entresto until I have seen him back for repeat virtual televisit on Thursday 4/2.  He will come in for BMET/CBC and BNP on 4/1 due to N/V.  3.  ASCAD - s/p non-STEMI in 2013 treated with PCI of the LCx/OM. LHC in 5/14 demonstrated severe 2 vessel CAD with critical in-stent restenosis in the LCx. He underwent CABG (SVG-PDA, SVG-LCx).  Repeat left heart cath showed patent SVG to RPDA, SVG to OM1, 50% ostial D1, 40% proximal to mid LAD, occluded ostial RCA, occluded ostial left circumflex, 40% ostial LAD.  He has no chest pain but does have chronic dyspnea on exertion which has resolved after diuresis.  He will continue on aspirin 81 mg daily,  beta-blocker and statin.  4.  HTN - he checked his BP this am and it was 90/22mmHg.  He will hold Toprol today and decrease to 50mg  daily starting tomorrow.   5.  Severe low gradient low output AS - S/P TAVR 05-15-18 with 29 mm EdwardsSapein3 THV via transfemoral approach.  2D echo 07/04/2018 showed a stable aortic valve TAVR with mild perivalvular AR.  Mean aortic valve gradient 6 mmHg.  Will continue on aspirin and Plavix until 11/13/2017 and then change to just aspirin.  6.  Dyslipidemia -his LDL goal is less than 70.  His LDL was 55 on 04/12/2018.  He will continue on Zocor 40 mg daily and I will repeat an FLP and ALT once the COVID19 restrictions are lifted.  7.  LLL lung mass - this was noted on  cxray and Chest C showed enlarging irregular mass like focus of consolidation in the LLL 3.5 x 2.2cm suspicious for malignancy.  PET scan revealed an area of hypermetabolic activity worrisome for adenoCA.  He underwent thoracentesis which was nondiagnostic.  He is scheduled for CT guided lung  bx tomorrow.     COVID-19 Education: The signs and symptoms of COVID-19 were discussed with the patient and how to seek care for testing (follow up with PCP or arrange E-visit).  The importance of social distancing was discussed today.  Patient Risk:   After full review of this patient's clinical status, I feel that they are at least moderate risk at this time.  Time:   Today, I have spent 45 minutes with the patient with telehealth technology discussing CHF, CAD, hypotension secondary to over diuresis, medication changes and followup.     Medication Adjustments/Labs and Tests Ordered: Current medicines are reviewed at length with the patient today.  Concerns regarding medicines are outlined above.  Tests Ordered: Orders Placed This Encounter  Procedures   CBC   Basic metabolic panel   Pro b natriuretic peptide (BNP)   Medication Changes: Meds ordered this encounter  Medications   metoprolol succinate (TOPROL-XL) 50 MG 24 hr tablet    Sig: Take 1 tablet (50 mg total) by mouth daily. Take with or immediately following a meal. Do not take if systolic blood pressure is less than 110.    Dispense:  90 tablet    Refill:  3    Dose decrease, do not fill, patient will call.   furosemide (LASIX) 20 MG tablet    Sig: Take 1 tablet (20 mg total) by mouth daily.    Dispense:  90 tablet    Refill:  3    Dose decrease, do not fill, patient will call.    Disposition:  Follow up in 2 day(s)  Signed, Fransico Him, MD  11/14/2018 2:42 PM    [facility]

## 2018-11-13 ENCOUNTER — Other Ambulatory Visit: Payer: Self-pay

## 2018-11-13 ENCOUNTER — Other Ambulatory Visit: Payer: Self-pay | Admitting: Student

## 2018-11-13 ENCOUNTER — Encounter: Payer: Self-pay | Admitting: Cardiology

## 2018-11-13 ENCOUNTER — Ambulatory Visit: Payer: Medicare Other | Admitting: Cardiology

## 2018-11-13 ENCOUNTER — Other Ambulatory Visit: Payer: Self-pay | Admitting: Radiology

## 2018-11-13 ENCOUNTER — Encounter (HOSPITAL_COMMUNITY): Payer: Self-pay

## 2018-11-13 ENCOUNTER — Telehealth (INDEPENDENT_AMBULATORY_CARE_PROVIDER_SITE_OTHER): Payer: Medicare Other | Admitting: Cardiology

## 2018-11-13 VITALS — BP 90/60 | HR 100 | Resp 20 | Ht 71.0 in | Wt 151.0 lb

## 2018-11-13 DIAGNOSIS — I5022 Chronic systolic (congestive) heart failure: Secondary | ICD-10-CM | POA: Diagnosis not present

## 2018-11-13 DIAGNOSIS — I10 Essential (primary) hypertension: Secondary | ICD-10-CM | POA: Diagnosis not present

## 2018-11-13 DIAGNOSIS — I2510 Atherosclerotic heart disease of native coronary artery without angina pectoris: Secondary | ICD-10-CM | POA: Diagnosis not present

## 2018-11-13 DIAGNOSIS — G4733 Obstructive sleep apnea (adult) (pediatric): Secondary | ICD-10-CM | POA: Diagnosis not present

## 2018-11-13 DIAGNOSIS — I951 Orthostatic hypotension: Secondary | ICD-10-CM | POA: Diagnosis not present

## 2018-11-13 DIAGNOSIS — I420 Dilated cardiomyopathy: Secondary | ICD-10-CM | POA: Diagnosis not present

## 2018-11-13 DIAGNOSIS — E785 Hyperlipidemia, unspecified: Secondary | ICD-10-CM | POA: Diagnosis not present

## 2018-11-13 DIAGNOSIS — I350 Nonrheumatic aortic (valve) stenosis: Secondary | ICD-10-CM | POA: Diagnosis not present

## 2018-11-13 DIAGNOSIS — Z8673 Personal history of transient ischemic attack (TIA), and cerebral infarction without residual deficits: Secondary | ICD-10-CM

## 2018-11-13 MED ORDER — FUROSEMIDE 20 MG PO TABS: 20.0000 mg | ORAL_TABLET | Freq: Every day | ORAL | 3 refills | Status: DC

## 2018-11-13 MED ORDER — METOPROLOL SUCCINATE ER 50 MG PO TB24: 50.0000 mg | ORAL_TABLET | Freq: Every day | ORAL | 3 refills | Status: DC

## 2018-11-13 NOTE — Patient Instructions (Signed)
Medication Instructions:  Decrease: Hold Lasix for 2 days, then reduce to 20 mg, daily, by mouth Decrease: Hold Metoprolol today, then reduce to 50 mg, daily, by mouth, as long as systolic blood pressure is greater than 110. Stop: Entresto for now  If you need a refill on your cardiac medications before your next appointment, please call your pharmacy.   Lab work: On 11/14/18: BMET, CBC and ProBNP  If you have labs (blood work) drawn today and your tests are completely normal, you will receive your results only by: Marland Kitchen MyChart Message (if you have MyChart) OR . A paper copy in the mail If you have any lab test that is abnormal or we need to change your treatment, we will call you to review the results.  Testing/Procedures: None  Follow-Up: Schedule Virtual Visit on 11/15/18 at 1:40 pm.

## 2018-11-14 ENCOUNTER — Other Ambulatory Visit: Payer: Self-pay

## 2018-11-14 ENCOUNTER — Ambulatory Visit (HOSPITAL_COMMUNITY)
Admission: RE | Admit: 2018-11-14 | Discharge: 2018-11-14 | Disposition: A | Discharge status: Home / Self Care | Payer: Medicare Other | Source: Ambulatory Visit | Attending: Thoracic Surgery (Cardiothoracic Vascular Surgery) | Admitting: Thoracic Surgery (Cardiothoracic Vascular Surgery)

## 2018-11-14 ENCOUNTER — Ambulatory Visit (HOSPITAL_COMMUNITY)
Admission: RE | Admit: 2018-11-14 | Discharge: 2018-11-14 | Disposition: A | Discharge status: Home / Self Care | Payer: Medicare Other | Source: Ambulatory Visit | Attending: Interventional Radiology | Admitting: Interventional Radiology

## 2018-11-14 ENCOUNTER — Other Ambulatory Visit: Payer: Medicare Other | Admitting: *Deleted

## 2018-11-14 ENCOUNTER — Telehealth: Payer: Self-pay

## 2018-11-14 ENCOUNTER — Encounter (HOSPITAL_COMMUNITY): Payer: Self-pay

## 2018-11-14 ENCOUNTER — Ambulatory Visit: Payer: Medicare Other

## 2018-11-14 DIAGNOSIS — I2510 Atherosclerotic heart disease of native coronary artery without angina pectoris: Secondary | ICD-10-CM | POA: Insufficient documentation

## 2018-11-14 DIAGNOSIS — I5022 Chronic systolic (congestive) heart failure: Secondary | ICD-10-CM

## 2018-11-14 DIAGNOSIS — E785 Hyperlipidemia, unspecified: Secondary | ICD-10-CM | POA: Diagnosis not present

## 2018-11-14 DIAGNOSIS — I5042 Chronic combined systolic (congestive) and diastolic (congestive) heart failure: Secondary | ICD-10-CM | POA: Diagnosis not present

## 2018-11-14 DIAGNOSIS — Z8673 Personal history of transient ischemic attack (TIA), and cerebral infarction without residual deficits: Secondary | ICD-10-CM | POA: Diagnosis not present

## 2018-11-14 DIAGNOSIS — N189 Chronic kidney disease, unspecified: Secondary | ICD-10-CM | POA: Insufficient documentation

## 2018-11-14 DIAGNOSIS — J9811 Atelectasis: Secondary | ICD-10-CM | POA: Diagnosis not present

## 2018-11-14 DIAGNOSIS — Z9889 Other specified postprocedural states: Secondary | ICD-10-CM | POA: Diagnosis not present

## 2018-11-14 DIAGNOSIS — Z79899 Other long term (current) drug therapy: Secondary | ICD-10-CM | POA: Diagnosis not present

## 2018-11-14 DIAGNOSIS — I130 Hypertensive heart and chronic kidney disease with heart failure and stage 1 through stage 4 chronic kidney disease, or unspecified chronic kidney disease: Secondary | ICD-10-CM | POA: Insufficient documentation

## 2018-11-14 DIAGNOSIS — Z7982 Long term (current) use of aspirin: Secondary | ICD-10-CM | POA: Insufficient documentation

## 2018-11-14 DIAGNOSIS — Z951 Presence of aortocoronary bypass graft: Secondary | ICD-10-CM | POA: Insufficient documentation

## 2018-11-14 DIAGNOSIS — G4733 Obstructive sleep apnea (adult) (pediatric): Secondary | ICD-10-CM | POA: Diagnosis not present

## 2018-11-14 DIAGNOSIS — Z87891 Personal history of nicotine dependence: Secondary | ICD-10-CM | POA: Diagnosis not present

## 2018-11-14 DIAGNOSIS — Z7902 Long term (current) use of antithrombotics/antiplatelets: Secondary | ICD-10-CM | POA: Diagnosis not present

## 2018-11-14 DIAGNOSIS — Z794 Long term (current) use of insulin: Secondary | ICD-10-CM | POA: Insufficient documentation

## 2018-11-14 DIAGNOSIS — C3432 Malignant neoplasm of lower lobe, left bronchus or lung: Secondary | ICD-10-CM | POA: Diagnosis not present

## 2018-11-14 DIAGNOSIS — Z952 Presence of prosthetic heart valve: Secondary | ICD-10-CM | POA: Insufficient documentation

## 2018-11-14 DIAGNOSIS — R911 Solitary pulmonary nodule: Secondary | ICD-10-CM | POA: Diagnosis not present

## 2018-11-14 DIAGNOSIS — M1990 Unspecified osteoarthritis, unspecified site: Secondary | ICD-10-CM | POA: Diagnosis not present

## 2018-11-14 DIAGNOSIS — E1122 Type 2 diabetes mellitus with diabetic chronic kidney disease: Secondary | ICD-10-CM | POA: Insufficient documentation

## 2018-11-14 DIAGNOSIS — I350 Nonrheumatic aortic (valve) stenosis: Secondary | ICD-10-CM | POA: Insufficient documentation

## 2018-11-14 LAB — CBC
HCT: 47.1 % (ref 39.0–52.0)
Hemoglobin: 15.5 g/dL (ref 13.0–17.0)
MCH: 25.1 pg — ABNORMAL LOW (ref 26.0–34.0)
MCHC: 32.9 g/dL (ref 30.0–36.0)
MCV: 76.3 fL — ABNORMAL LOW (ref 80.0–100.0)
Platelets: 207 10*3/uL (ref 150–400)
RBC: 6.17 MIL/uL — ABNORMAL HIGH (ref 4.22–5.81)
RDW: 15.5 % (ref 11.5–15.5)
WBC: 4.7 10*3/uL (ref 4.0–10.5)
nRBC: 0 % (ref 0.0–0.2)

## 2018-11-14 LAB — APTT: aPTT: 31 seconds (ref 24–36)

## 2018-11-14 LAB — GLUCOSE, CAPILLARY: Glucose-Capillary: 103 mg/dL — ABNORMAL HIGH (ref 70–99)

## 2018-11-14 LAB — PROTIME-INR
INR: 1 (ref 0.8–1.2)
Prothrombin Time: 12.9 seconds (ref 11.4–15.2)

## 2018-11-14 MED ORDER — MIDAZOLAM HCL 2 MG/2ML IJ SOLN
INTRAMUSCULAR | Status: AC | PRN
  Administered 2018-11-14: 1 mg via INTRAVENOUS

## 2018-11-14 MED ORDER — MIDAZOLAM HCL 2 MG/2ML IJ SOLN
INTRAMUSCULAR | Status: AC
  Filled 2018-11-14: qty 2

## 2018-11-14 MED ORDER — SODIUM CHLORIDE 0.9 % IV SOLN: INTRAVENOUS | Status: DC

## 2018-11-14 MED ORDER — FENTANYL CITRATE (PF) 100 MCG/2ML IJ SOLN
INTRAMUSCULAR | Status: AC | PRN
  Administered 2018-11-14: 25 ug via INTRAVENOUS

## 2018-11-14 MED ORDER — FENTANYL CITRATE (PF) 100 MCG/2ML IJ SOLN
INTRAMUSCULAR | Status: AC
  Filled 2018-11-14: qty 2

## 2018-11-14 MED ORDER — LIDOCAINE-EPINEPHRINE 1 %-1:100000 IJ SOLN
INTRAMUSCULAR | Status: AC
  Filled 2018-11-14: qty 1

## 2018-11-14 NOTE — Sedation Documentation (Signed)
Patient given lunch tray and beverage.

## 2018-11-14 NOTE — Sedation Documentation (Signed)
Patient given verbal d/c instructions with verbal understanding. Patient taken to car via wheelchair by volunteer. Patient informed of signs and symptoms to monitor for and tom seek medical attention. Patient verbalizes understanding.

## 2018-11-14 NOTE — Sedation Documentation (Signed)
Patients wife called and informed patient will be ready for pick up. States she is on the way.

## 2018-11-14 NOTE — Telephone Encounter (Signed)
TELEPHONE CALL NOTE  This patient has been deemed a candidate for follow-up tele-health visit to limit community exposure during the Covid-19 pandemic. I spoke with the patient via phone to discuss instructions. This has been outlined on the patient's AVS (dotphrase: [email or web address]). The patient was advised to review the section on consent for treatment as well. The patient will receive a phone call 2-3 days prior to their E-Visit at which time consent will be verbally confirmed. A Virtual Office Visit appointment type has been scheduled for 1:30 pm on 11/15/2018 with Dr Radford Pax, with "VIDEO" or "TELEPHONE" in the appointment notes.  Arthella Headings, [facility] 11/14/2018 4:31 PM     Virtual Visit Pre-Appointment Phone Call  TELEPHONE CALL NOTE  COURVOISIER HAMBLEN has been deemed a candidate for a follow-up tele-health visit to limit community exposure during the Covid-19 pandemic. I spoke with the patient via phone to ensure availability of phone/video source, confirm preferred email & phone number, and discuss instructions and expectations.  I reminded RAJI GLINSKI to be prepared with any vital sign and/or heart rhythm information that could potentially be obtained via home monitoring, at the time of his visit. I reminded KINGSLEY FARACE to expect a phone call at the time of his visit if his visit.  Did the patient verbally acknowledge consent to treatment? Yes  Shaine Mount, [place] 11/14/2018 4:32 PM   DOWNLOADING THE [place]  - If Apple, go to [employer] and type in WebEx in the search bar. [place] [employer], the blue/green circle. The app is free but as with any other app downloads, their phone may require them to verify saved payment information or Apple password. The patient does NOT have to create an account.  - If Android, ask patient to go to [employer] and type in WebEx in the search bar. [place] [employer], the blue/green circle. The app is free but as  with any other app downloads, their phone may require them to verify saved payment information or Android password. The patient does NOT have to create an account.   CONSENT FOR TELE-HEALTH VISIT - PLEASE REVIEW  I hereby voluntarily request, consent and authorize [facility] and its employed or contracted physicians, physician assistants, nurse practitioners or other licensed health care professionals (the Practitioner), to provide me with telemedicine health care services (the "Services") as deemed necessary by the treating Practitioner. I acknowledge and consent to receive the Services by the Practitioner via telemedicine. I understand that the telemedicine visit will involve communicating with the Practitioner through live audiovisual communication technology and the disclosure of certain medical information by electronic transmission. I acknowledge that I have been given the opportunity to request an in-person assessment or other available alternative prior to the telemedicine visit and am voluntarily participating in the telemedicine visit.  I understand that I have the right to withhold or withdraw my consent to the use of telemedicine in the course of my care at any time, without affecting my right to future care or treatment, and that the Practitioner or I may terminate the telemedicine visit at any time. I understand that I have the right to inspect all information obtained and/or recorded in the course of the telemedicine visit and may receive copies of available information for a reasonable fee.  I understand that some of the potential risks of receiving the Services via telemedicine include:  Marland Kitchen Delay or interruption in medical evaluation due to technological equipment failure or  disruption; . Information transmitted may not be sufficient (e.g. poor resolution of images) to allow for appropriate medical decision making by the Practitioner; and/or  . In rare instances, security protocols  could fail, causing a breach of personal health information.  Furthermore, I acknowledge that it is my responsibility to provide information about my medical history, conditions and care that is complete and accurate to the best of my ability. I acknowledge that Practitioner's advice, recommendations, and/or decision may be based on factors not within their control, such as incomplete or inaccurate data provided by me or distortions of diagnostic images or specimens that may result from electronic transmissions. I understand that the practice of medicine is not an exact science and that Practitioner makes no warranties or guarantees regarding treatment outcomes. I acknowledge that I will receive a copy of this consent concurrently upon execution via email to the email address I last provided but may also request a printed copy by calling the office of [place].    I understand that my insurance will be billed for this visit.   I have read or had this consent read to me. . I understand the contents of this consent, which adequately explains the benefits and risks of the Services being provided via telemedicine.  . I have been provided ample opportunity to ask questions regarding this consent and the Services and have had my questions answered to my satisfaction. . I give my informed consent for the services to be provided through the use of telemedicine in my medical care  By participating in this telemedicine visit I agree to the above.

## 2018-11-14 NOTE — H&P (Signed)
Chief Complaint: Patient was seen in consultation today for left lung nodule biopsy at the request of Hendrickson,Steven C  Referring Physician(s): [place] C  Supervising Physician: Sandi Mariscal  Patient Status: [facility] - Out-pt  History of Present Illness: [name] is a 68 y.o. male   CAD/stenting/CABG/TAVR- Ao stenosis CVA; CHF CKD; DM Malignant seminoma  Evaluation for TAVR revealed LLL nodule Repeated imaging showed enlargement LD Plavix 3/24  PET 10/17/18: IMPRESSION: 1. Hypermetabolic 3.6 by 1.9 cm left lower lobe mass, maximum SUV 4.3, suspicious for potential adenocarcinoma. Tissue diagnosis recommended. 2. Small focus of accentuated activity along the left hilum could represent early metastatic spread but may be incidental. Maximum SUV in this vicinity is 2.7, just above the blood pool level which is 2.4. 3. Moderate bilateral pleural effusions, cause uncertain, increased in size compared to 10/03/2018. 4. The 6 mm right upper lobe lesion is not hypermetabolic but is below sensitive PET-CT size thresholds. 5. Postoperative findings and low-grade activity in the anterior mediastinum, not entirely specific but likely related to prior CABG. 6. Other imaging findings of potential clinical significance: Nonobstructive right nephrolithiasis. Aortic Atherosclerosis (ICD10-I70.0). Coronary atherosclerosis. Aortic valve prosthesis. Prominence of stool in the rectal vault. Scattered calcifications along the tunica albuginea of the penis. Mucous retention cyst in left maxillary sinus. Degenerative left sternoclavicular joint.  Scheduled now for LLL nodule biopsy   Past Medical History:  Diagnosis Date   Acute on chronic combined systolic and diastolic CHF (congestive heart failure) ([place]) 04/16/2018   Anemia    Arthritis    Chronic kidney disease    Chronic systolic CHF (congestive heart failure) ([place]) 07/04/2018   Coronary artery disease 02/2012     a. s/p stenting in 2013  b. s/p CABGx2V (SVG--> PDA, SVG--> LCx).   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   OSA (obstructive sleep apnea) 07/04/2018   Severe obstructive sleep apnea with an AHI of 30/h and mild central sleep apnea at 13.7/h with oxygen desaturations as low as 79%   S/P TAVR (transcatheter aortic valve replacement) 05/15/2018   29 mm Edwards Sapien 3 transcatheter heart valve placed via percutaneous right transfemoral approach    Severe aortic stenosis     Past Surgical History:  Procedure Laterality Date   COLONOSCOPY  07/25/2012   Procedure: COLONOSCOPY;  Surgeon: Winfield Cunas., MD;  Location: [facility] ENDOSCOPY;  Service: Endoscopy;  Laterality: N/A;   COLONOSCOPY N/A 12/02/2013   Procedure: COLONOSCOPY;  Surgeon: Winfield Cunas., MD;  Location: [facility] ENDOSCOPY;  Service: Endoscopy;  Laterality: N/A;   CORONARY ARTERY BYPASS GRAFT N/A 01/04/2013   Procedure: CORONARY ARTERY BYPASS GRAFTING (CABG) times two using right saphenous vein harvested with endoscope.;  Surgeon: Ivin Poot, MD;  Location: [place];  Service: Open Heart Surgery;  Laterality: N/A;   ESOPHAGOGASTRODUODENOSCOPY  07/20/2012   Procedure: ESOPHAGOGASTRODUODENOSCOPY (EGD);  Surgeon: Winfield Cunas., MD;  Location: [facility] ENDOSCOPY;  Service: Endoscopy;  Laterality: N/A;   ESOPHAGOGASTRODUODENOSCOPY N/A 12/02/2013   Procedure: ESOPHAGOGASTRODUODENOSCOPY (EGD);  Surgeon: Winfield Cunas., MD;  Location: [facility];  Service: Endoscopy;  Laterality: N/A;   HOT HEMOSTASIS N/A 12/02/2013  Procedure: HOT HEMOSTASIS (ARGON PLASMA COAGULATION/BICAP);  Surgeon: Winfield Cunas., MD;  Location: [facility] ENDOSCOPY;   Service: Endoscopy;  Laterality: N/A;   INTRAOPERATIVE TRANSESOPHAGEAL ECHOCARDIOGRAM N/A 01/04/2013   Procedure: INTRAOPERATIVE TRANSESOPHAGEAL ECHOCARDIOGRAM;  Surgeon: Ivin Poot, MD;  Location: [place];  Service: Open Heart Surgery;  Laterality: N/A;   INTRAOPERATIVE TRANSTHORACIC ECHOCARDIOGRAM N/A 05/15/2018   Procedure: INTRAOPERATIVE TRANSTHORACIC ECHOCARDIOGRAM;  Surgeon: Burnell Blanks, MD;  Location: [place];  Service: Open Heart Surgery;  Laterality: N/A;   IR THORACENTESIS ASP PLEURAL SPACE W/IMG GUIDE  10/19/2018   LEFT HEART CATHETERIZATION WITH CORONARY ANGIOGRAM N/A 03/06/2012   Procedure: LEFT HEART CATHETERIZATION WITH CORONARY ANGIOGRAM;  Surgeon: [name], MD;  Location: [place] CATH LAB;  Service: Cardiovascular;  Laterality: N/A;   LITHOTRIPSY     [place] Left 01/04/2013   Procedure: RADIAL ARTERY HARVEST;  Surgeon: Ivin Poot, MD;  Location: [place];  Service: Vascular;  Laterality: Left;  Artery not havested. Unsuitable for use.   resection mediastinal seminonma     RIGHT/LEFT HEART CATH AND CORONARY/GRAFT ANGIOGRAPHY N/A 11/15/2016   Procedure: Right/Left Heart Cath and Coronary/Graft Angiography;  Surgeon: Leonie Man, MD;  Location: [place] CV LAB;  Service: Cardiovascular;  Laterality: N/A;   RIGHT/LEFT HEART CATH AND CORONARY/GRAFT ANGIOGRAPHY N/A 04/13/2018   Procedure: RIGHT/LEFT HEART CATH AND CORONARY/GRAFT ANGIOGRAPHY;  Surgeon: Jolaine Artist, MD;  Location: [place] CV LAB;  Service: Cardiovascular;  Laterality: N/A;   TRANSCATHETER AORTIC VALVE REPLACEMENT, TRANSFEMORAL  05/15/2018   TRANSCATHETER AORTIC VALVE REPLACEMENT, TRANSFEMORAL N/A 05/15/2018   Procedure: TRANSCATHETER AORTIC VALVE REPLACEMENT, TRANSFEMORAL;  Surgeon: Burnell Blanks, MD;  Location: [place];  Service: Open Heart Surgery;  Laterality: N/A;    Allergies: Lipitor [atorvastatin] and Adhesive  [tape]  Medications: Prior to Admission medications   Medication Sig Start Date End Date Taking? Authorizing Provider  amitriptyline (ELAVIL) 25 MG tablet Take 25 mg by mouth at bedtime.    Yes [provider]  aspirin 81 MG chewable tablet Chew 1 tablet (81 mg total) by mouth daily. 05/17/18  Yes Eileen Stanford, PA-C  B Complex-C (SUPER B COMPLEX PO) Take 1 tablet by mouth daily.   Yes [provider]  Cholecalciferol (VITAMIN D-3) 5000 units TABS Take 5,000 Units by mouth daily.   Yes [provider]  furosemide (LASIX) 20 MG tablet Take 1 tablet (20 mg total) by mouth daily. 11/13/18 11/13/19 Yes Turner, Eber Hong, MD  insulin detemir (LEVEMIR) 100 UNIT/ML injection Inject 40 Units into the skin every morning.   Yes [provider]  levothyroxine (SYNTHROID, LEVOTHROID) 50 MCG tablet Take 50 mcg by mouth daily before breakfast.    Yes [provider]  metFORMIN (GLUCOPHAGE) 500 MG tablet Take 2 tablets (1,000 mg total) by mouth 2 (two) times daily with a meal. 05/17/18  Yes Eileen Stanford, PA-C  metoprolol succinate (TOPROL-XL) 50 MG 24 hr tablet Take 1 tablet (50 mg total) by mouth daily. Take with or immediately following a meal. Do not take if systolic blood pressure is less than 110. 11/13/18 11/13/19 Yes Turner, Eber Hong, MD  Multiple Vitamin (MULTIVITAMIN) tablet Take 1 tablet by mouth daily.   Yes [provider]  simvastatin (ZOCOR) 20 MG tablet Take 20 mg by mouth every evening.    Yes [provider]  zolpidem (AMBIEN) 10 MG tablet Take 10 mg by mouth at bedtime.  Yes [provider]  amoxicillin (AMOXIL) 500 MG capsule Take 1 capsule (500 mg total) by mouth as directed. Take 4 tablets (2000 mg ) 1 hour prior to dental procedures. 05/28/18   Eileen Stanford, PA-C  B-D ULTRAFINE III SHORT PEN 31G X 8 MM MISC 1 each by Other route daily as needed (GLUCOSE TESTING (INSULINE NEEDLE)).  04/22/13   [provider]  clopidogrel (PLAVIX) 75 MG tablet Take 1 tablet (75 mg total) by mouth daily with breakfast. 05/17/18   Eileen Stanford, PA-C  hydroxypropyl methylcellulose / hypromellose (ISOPTO TEARS / GONIOVISC) 2.5 % ophthalmic solution Place 1 drop into both eyes 4 (four) times daily as needed for dry eyes.    [provider]  ONE TOUCH ULTRA TEST test strip 1 each by Other route as needed for other.  01/15/13   [provider]     Family History  Problem Relation Age of Onset   Diabetes Father    Diabetes Mother    Heart failure Mother    Hypertension Mother    Cancer Brother    Diabetes Sister     Social History   Socioeconomic History   Marital status: Married    Spouse name: Not on file   Number of children: Not on file   Years of education: Not on file   Highest education level: Not on file  Occupational History   Not on file  Social Needs   Financial resource strain: Not on file   Food insecurity:    Worry: Not on file    Inability: Not on file   Transportation needs:    Medical: Not on file    Non-medical: Not on file  Tobacco Use   Smoking status: Former Smoker    Last attempt to quit: 03/06/1985    Years since quitting: 33.7   Smokeless tobacco: Never Used  Substance and Sexual Activity   Alcohol use: No   Drug use: No   Sexual activity: Not Currently  Lifestyle   Physical activity:    Days per week: Not on file    Minutes per session: Not on file   Stress: Not on file  Relationships   Social connections:    Talks on phone: Not on file    Gets together: Not on file    Attends religious service: Not on file    Active member of club or organization: Not on file    Attends meetings of clubs or organizations: Not on file    Relationship status: Not on file  Other Topics Concern   Not on file  Social History Narrative   Not on file     Review of Systems: A 12 point ROS discussed and pertinent positives  are indicated in the HPI above.  All other systems are negative.  Review of Systems  Constitutional: Negative for activity change, fatigue and fever.  Respiratory: Positive for cough and shortness of breath.   Cardiovascular: Negative for chest pain.  Gastrointestinal: Negative for abdominal pain.  Neurological: Negative for weakness.  Psychiatric/Behavioral: Negative for behavioral problems and confusion.    Vital Signs: BP 108/75    Pulse 74    Temp 98 F (36.7 C) (Oral)    Resp 16    Ht 5\' 11"  (1.803 m)    Wt 151 lb (68.5 kg)    SpO2 99%    BMI 21.06 kg/m   Physical Exam Vitals signs reviewed.  Cardiovascular:  Rate and Rhythm: Normal rate and regular rhythm.     Heart sounds: Normal heart sounds.  Pulmonary:     Effort: Pulmonary effort is normal.     Breath sounds: Normal breath sounds.  Abdominal:     General: Bowel sounds are normal.  Musculoskeletal: Normal range of motion.  Skin:    General: Skin is warm and dry.  Neurological:     Mental Status: He is alert and oriented to person, place, and time.  Psychiatric:        Mood and Affect: Mood normal.        Behavior: Behavior normal.        Thought Content: Thought content normal.        Judgment: Judgment normal.     Imaging: Dg Chest 1 View  Result Date: 10/20/2018 CLINICAL DATA:  Status post left thoracentesis. EXAM: CHEST  1 VIEW COMPARISON:  PET-CT March 4th 2020 FINDINGS: Cardiomediastinal silhouette is stable. There is a small right pleural effusion. No left pleural effusion identified. No pneumothorax after thoracentesis. No other acute abnormalities identified. IMPRESSION: Small right pleural effusion. No identified left-sided pleural effusion. No pneumothorax after left thoracentesis. No definitive nodule, mass, or infiltrate seen on this single view chest x-ray. Electronically Signed   By: Dorise Bullion III M.D   On: 10/20/2018 19:50   Nm Pet Image Initial (pi) Skull Base To Thigh  Result Date:  10/17/2018 CLINICAL DATA:  Initial treatment strategy for left lower lobe mass. EXAM: NUCLEAR MEDICINE PET SKULL BASE TO THIGH TECHNIQUE: 8.2 mCi F-18 FDG was injected intravenously. Full-ring PET imaging was performed from the skull base to thigh after the radiotracer. CT data was obtained and used for attenuation correction and anatomic localization. Fasting blood glucose: 208 mg/dl COMPARISON:  CT scan 10/03/2018 FINDINGS: Mediastinal blood pool activity: SUV max 2.4 NECK: No significant abnormal hypermetabolic activity in this region. Incidental CT findings: Mucous retention cyst of the left maxillary sinus. Bilateral common carotid artery atherosclerotic calcification. Small nodules in the thyroid gland are not hypermetabolic. CHEST: The left lower lobe mass measures approximately 3.6 by 1.9 cm and has maximum SUV of 4.3. Subtle accentuated left hilar activity without a well-defined lymph node, maximum SUV 2.7, just above the blood pool. Postoperative findings in the anterior mediastinum with multiple clips in the region, indistinct density, maximum SUV 3.3. Incidental CT findings: Moderate bilateral pleural effusions. Coronary, aortic arch, and branch vessel atherosclerotic vascular disease. Aortic valve prosthesis. Mild cardiomegaly. 6 by 4 mm right upper lobe pulmonary nodule just above the minor fissure on image 32/8, no appreciable hypermetabolic activity, this lesion is below sensitive PET-CT size thresholds. Passive atelectasis both lungs noted. ABDOMEN/PELVIS: Scattered physiologic activity in bowel, including a segment of what appears to be small bowel adjacent to the left renal hilum which has no abnormal CT characteristic and accordingly is likely incidental. Incidental CT findings: Aortoiliac atherosclerotic vascular disease. 9 mm stone in the right kidney lower pole. Prominence of stool in the rectal vault. Scattered calcifications along the tunic albuginea of the penis. SKELETON: No significant  abnormal hypermetabolic activity in this region. Incidental CT findings: Degenerative findings at the left sternoclavicular joint. IMPRESSION: 1. Hypermetabolic 3.6 by 1.9 cm left lower lobe mass, maximum SUV 4.3, suspicious for potential adenocarcinoma. Tissue diagnosis recommended. 2. Small focus of accentuated activity along the left hilum could represent early metastatic spread but may be incidental. Maximum SUV in this vicinity is 2.7, just above the blood pool level which is 2.4.  3. Moderate bilateral pleural effusions, cause uncertain, increased in size compared to 10/03/2018. 4. The 6 mm right upper lobe lesion is not hypermetabolic but is below sensitive PET-CT size thresholds. 5. Postoperative findings and low-grade activity in the anterior mediastinum, not entirely specific but likely related to prior CABG. 6. Other imaging findings of potential clinical significance: Nonobstructive right nephrolithiasis. Aortic Atherosclerosis (ICD10-I70.0). Coronary atherosclerosis. Aortic valve prosthesis. Prominence of stool in the rectal vault. Scattered calcifications along the tunica albuginea of the penis. Mucous retention cyst in left maxillary sinus. Degenerative left sternoclavicular joint. Electronically Signed   By: Van Clines M.D.   On: 10/17/2018 09:34   Ir Thoracentesis Asp Pleural Space W/img Guide  Result Date: 10/19/2018 INDICATION: Patient with history of CAD, aortic stenosis, history of TAVR (2019) and CHF noted to have symptomatic bilateral pleural effusions on most recent imaging. Request for diagnostic and therapeutic thoracentesis today in IR. EXAM: ULTRASOUND GUIDED LEFT THORACENTESIS MEDICATIONS: 7 mL 1% lidocaine. COMPLICATIONS: None immediate. PROCEDURE: An ultrasound guided thoracentesis was thoroughly discussed with the patient and questions answered. The benefits, risks, alternatives and complications were also discussed. The patient understands and wishes to proceed with the  procedure. Written consent was obtained. Ultrasound was performed to localize and mark an adequate pocket of fluid in the left chest. The area was then prepped and draped in the normal sterile fashion. 1% Lidocaine was used for local anesthesia. Under ultrasound guidance a 6 Fr Safe-T-Centesis catheter was introduced. Thoracentesis was performed. The catheter was removed and a dressing applied. FINDINGS: A total of approximately 670 mL of clear yellow fluid was removed. Samples were sent to the laboratory as requested by the clinical team. IMPRESSION: Successful ultrasound guided left thoracentesis yielding 670 mL of pleural fluid. Read by Candiss Norse, PA-C Electronically Signed   By: Jerilynn Mages.  Shick M.D.   On: 10/19/2018 16:02    Labs:  CBC: Recent Labs    05/11/18 1210  05/15/18 1359 05/16/18 0444 10/25/18 1429 11/14/18 0640  WBC 3.6*  --   --  5.2 5.3 4.7  HGB 14.8   < > 14.3 14.0 14.4 15.5  HCT 46.6   < > 42.0 42.8 44.2 47.1  PLT 175  --   --  165 183 207   < > = values in this interval not displayed.    COAGS: Recent Labs    05/11/18 1210 11/14/18 0640  INR 1.05 1.0  APTT 32 31    BMP: Recent Labs    10/24/18 1037 10/25/18 1429 11/05/18 0724 11/09/18 1106  NA 139 140 142 134  K 4.8 3.9 4.4 4.2  CL 101 106 103 102  CO2 20 24 23 24   GLUCOSE 236* 137* 162* 264*  BUN 24 23 24  31*  CALCIUM 9.3 8.8* 8.8 8.6  CREATININE 1.57* 1.50* 1.27 1.40*  GFRNONAA 44* 47* 57* 51*  GFRAA 51* 54* 66 59*    LIVER FUNCTION TESTS: Recent Labs    03/28/18 0850 05/11/18 1210 10/25/18 1429  BILITOT  --  0.7 0.5  AST  --  33 23  ALT 21 55* 33  ALKPHOS  --  73 81  PROT  --  6.4* 6.9  ALBUMIN  --  3.5 3.5    TUMOR MARKERS: No results for input(s): AFPTM, CEA, CA199, CHROMGRNA in the last 8760 hours.  Assessment and Plan:  LLL nodule followed and enlarging +PET Scheduled for biopsy of same LD Plavix 3/24 Risks and benefits discussed with the patient including, but  not  limited to bleeding, hemoptysis, respiratory failure requiring intubation, infection, pneumothorax requiring chest tube placement, stroke from air embolism or even death.  All of the patient's questions were answered, patient is agreeable to proceed. Consent signed and in chart.  Thank you for this interesting consult.  I greatly enjoyed meeting [name] and look forward to participating in their care.  A copy of this report was sent to the requesting provider on this date.  Electronically Signed: Lavonia Drafts, PA-C 11/14/2018, 7:51 AM   I spent a total of  30 Minutes   in face to face in clinical consultation, greater than 50% of which was counseling/coordinating care for left lung mass biopsy

## 2018-11-14 NOTE — Discharge Instructions (Addendum)
Needle Biopsy of the Lung, Care After °This sheet gives you information about how to care for yourself after your procedure. Your health care provider may also give you more specific instructions. If you have problems or questions, contact your health care provider. °What can I expect after the procedure? °After the procedure, it is common to have: °· Soreness, pain, and tenderness where a tissue sample was taken (biopsy site). °· A cough. °· A sore throat. °Follow these instructions at home: °Biopsy site care °· Follow instructions from your health care provider about when to remove the bandage that was placed on the biopsy site. °· Keep the bandage dry until it has been removed. °· Check your biopsy site every day for signs of infection. Check for: °? More redness, swelling, or pain. °? More fluid or blood. °? Warmth to the touch. °? Pus or a bad smell. °General instructions ° °· Rest as directed by your health care provider. Ask your health care provider what activities are safe for you. °· Do not take baths, swim, or use a hot tub until your health care provider approves. °· Take over-the-counter and prescription medicines only as told by your health care provider. °· If you have airplane travel scheduled, talk with your health care provider about when it is safe for you to travel by airplane. °· It is up to you to get the results of your procedure. Ask your health care provider, or the department that is doing the procedure, when your results will be ready. °· Keep all follow-up visits as told by your health care provider. This is important. °Contact a health care provider if: °· You have more redness, swelling, or pain around your biopsy site. °· You have more fluid or blood coming from your biopsy site. °· Your biopsy site feels warm to the touch. °· You have pus or a bad smell coming from your biopsy site. °· You have a fever. °· You have pain that does not get better with medicine. °Get help right away  if: °· You have problems breathing. °· You have chest pain. °· You cough up blood. °· You faint. °· You have a fast heart rate. °Summary °· After a needle biopsy of the lung, it is common to have a cough, a sore throat, or soreness, pain, and tenderness where a tissue sample was taken (biopsy site). °· You should check your biopsy area every day for signs of infection, including pus or a bad smell, warmth, more fluid or blood, or more redness, swelling, or pain. °· You should not take baths, swim, or use a hot tub until your health care provider approves. °· It is up to you to get the results of your procedure. Ask your health care provider, or the department that is doing the procedure, when your results will be ready. °This information is not intended to replace advice given to you by your health care provider. Make sure you discuss any questions you have with your health care provider. °Document Released: 05/29/2007 Document Revised: 06/22/2016 Document Reviewed: 06/22/2016 °Elsevier Interactive Patient Education © 2019 Elsevier Inc[employer] ° ° ° °Moderate Conscious Sedation, Adult, Care After °These instructions provide you with information about caring for yourself after your procedure. Your health care provider may also give you more specific instructions. Your treatment has been planned according to current medical practices, but problems sometimes occur. Call your health care provider if you have any problems or questions after your procedure. °What can I expect   after the procedure? °After your procedure, it is common: °· To feel sleepy for several hours. °· To feel clumsy and have poor balance for several hours. °· To have poor judgment for several hours. °· To vomit if you eat too soon. °Follow these instructions at home: °For at least 24 hours after the procedure: ° °· Do not: °? Participate in activities where you could fall or become injured. °? Drive. °? Use heavy machinery. °? Drink alcohol. °? Take sleeping  pills or medicines that cause drowsiness. °? Make important decisions or sign legal documents. °? Take care of children on your own. °· Rest. °Eating and drinking °· Follow the diet recommended by your health care provider. °· If you vomit: °? Drink water, juice, or soup when you can drink without vomiting. °? Make sure you have little or no nausea before eating solid foods. °General instructions °· Have a responsible adult stay with you until you are awake and alert. °· Take over-the-counter and prescription medicines only as told by your health care provider. °· If you smoke, do not smoke without supervision. °· Keep all follow-up visits as told by your health care provider. This is important. °Contact a health care provider if: °· You keep feeling nauseous or you keep vomiting. °· You feel light-headed. °· You develop a rash. °· You have a fever. °Get help right away if: °· You have trouble breathing. °This information is not intended to replace advice given to you by your health care provider. Make sure you discuss any questions you have with your health care provider. °Document Released: 05/22/2013 Document Revised: 01/04/2016 Document Reviewed: 11/21/2015 °Elsevier Interactive Patient Education © 2019 Elsevier Inc. ° ° °

## 2018-11-14 NOTE — Procedures (Signed)
Pre procedural Dx: Hypermetabolic left lower lobe pulmonary nodule  Post procedural Dx: Same  Technically successful CT guided biopsy of indeterminate left lower lobe pulmonary nodule.    EBL: None.   Complications: None immediate.   Ronny Bacon, MD Pager #: [phone]

## 2018-11-14 NOTE — Sedation Documentation (Signed)
Patients wife updated as requested by patient.

## 2018-11-14 NOTE — Sedation Documentation (Signed)
Pt taken back to IR nurses station for recovery. Pt stable at this time.

## 2018-11-15 ENCOUNTER — Encounter (HOSPITAL_COMMUNITY): Payer: Self-pay

## 2018-11-15 ENCOUNTER — Telehealth (INDEPENDENT_AMBULATORY_CARE_PROVIDER_SITE_OTHER): Payer: Medicare Other | Admitting: Cardiology

## 2018-11-15 ENCOUNTER — Encounter: Payer: Self-pay | Admitting: Cardiology

## 2018-11-15 VITALS — BP 96/60 | HR 96 | Resp 18 | Ht 71.0 in | Wt 155.0 lb

## 2018-11-15 DIAGNOSIS — I5022 Chronic systolic (congestive) heart failure: Secondary | ICD-10-CM

## 2018-11-15 DIAGNOSIS — I10 Essential (primary) hypertension: Secondary | ICD-10-CM

## 2018-11-15 DIAGNOSIS — E785 Hyperlipidemia, unspecified: Secondary | ICD-10-CM

## 2018-11-15 DIAGNOSIS — I350 Nonrheumatic aortic (valve) stenosis: Secondary | ICD-10-CM | POA: Diagnosis not present

## 2018-11-15 DIAGNOSIS — I110 Hypertensive heart disease with heart failure: Secondary | ICD-10-CM | POA: Diagnosis not present

## 2018-11-15 DIAGNOSIS — I2510 Atherosclerotic heart disease of native coronary artery without angina pectoris: Secondary | ICD-10-CM

## 2018-11-15 LAB — CBC
Hematocrit: 45.9 % (ref 37.5–51.0)
Hemoglobin: 14.7 g/dL (ref 13.0–17.7)
MCH: 25.4 pg — ABNORMAL LOW (ref 26.6–33.0)
MCHC: 32 g/dL (ref 31.5–35.7)
MCV: 79 fL (ref 79–97)
Platelets: 182 10*3/uL (ref 150–450)
RBC: 5.78 x10E6/uL (ref 4.14–5.80)
RDW: 15.4 % (ref 11.6–15.4)
WBC: 3.3 10*3/uL — ABNORMAL LOW (ref 3.4–10.8)

## 2018-11-15 LAB — BASIC METABOLIC PANEL
BUN/Creatinine Ratio: 21 (ref 10–24)
BUN: 26 mg/dL (ref 8–27)
CO2: 21 mmol/L (ref 20–29)
Calcium: 8.8 mg/dL (ref 8.6–10.2)
Chloride: 103 mmol/L (ref 96–106)
Creatinine, Ser: 1.25 mg/dL (ref 0.76–1.27)
GFR calc Af Amer: 67 mL/min/{1.73_m2} (ref >59)
GFR calc non Af Amer: 58 mL/min/{1.73_m2} — ABNORMAL LOW (ref >59)
Glucose: 354 mg/dL — ABNORMAL HIGH (ref 65–99)
Potassium: 4.6 mmol/L (ref 3.5–5.2)
Sodium: 141 mmol/L (ref 134–144)

## 2018-11-15 LAB — PRO B NATRIURETIC PEPTIDE: NT-Pro BNP: 935 pg/mL — ABNORMAL HIGH (ref 0–376)

## 2018-11-15 NOTE — Progress Notes (Signed)
Virtual Visit via Video Note    Evaluation Performed:  Follow-up visit  This visit type was conducted due to national recommendations for restrictions regarding the COVID-19 Pandemic (e.g. social distancing).  This format is felt to be most appropriate for this patient at this time.  All issues noted in this document were discussed and addressed.  No physical exam was performed (except for noted visual exam findings with Video Visits).  Please refer to the patient's chart (MyChart message for video visits and phone note for telephone visits) for the patient's consent to telehealth for [name].  Date:  11/16/2018   ID:  [name], DOB 01-10-1951, MRN [id number]  Patient Location:  Home  Provider location:   [place]  PCP:  [name], MD  Cardiologist:  Fransico Him, MD  Electrophysiologist:  None   Chief Complaint:  CHF, orthostatic hypotension  History of Present Illness:    [name] is a 68 y.o. male who presents via audio/video conferencing for a telehealth visit today.    [name] Cottenis a 68 y.o.malewith a hx of CAD, aortic stenosis and systolic HF. Hehad anon-STEMI in 2013 treated with PCI of the LCx/OM. LHC in 5/14 demonstrated severe 2 vessel CAD with critical in-stent restenosis in the LCx. He underwent CABG (SVG-PDA, SVG-LCx). He also has a history of aortic stenosis. Echo 10/2016 showed moderately reduced LVF at 35-40% with diffuse HK, mild AS and moderate AR. Right and Left heart cath showed mild to moderate AS with AVA 0.837cm2 and EF 45% with patent SVG to RPDA and SVG to OM1 With occluded native vessels. He ultimately was diagnosed with low gradient aortic stenosis with AI and underwent TAVR with a 29 mm EdwardsSapein3 THV via transfemoral approach after being admitted with acute exacerbation of CHF on 05/15/2018.  He has had some  problems with SOB and a BNP was elevated at 1501.  His Lasix was increased to 40mg  BID for 2 days and then  down to 20mg  daily from his usual daily does of 20mg  daily.  On televisit a few days ago he had dropped 11lbs  and his BP had been running as low as 80/20mmHg and he was feeling bad with severe fatigue and  nausea and vomiting after his BP dropped.  He had recently started on Entresto 24-26mg  BID. He has not had any further LE edema or SOB since dropping 11lbs.  His baseline weight is 162lbs and he is down to 151lbs. I instructed him to stop his Lasix and Entresto and decrease Toprol to 50mg  daily.    He is now back today for virtual televisit and is feeling much better.  His BP has improved and he has not had any further dizziness/N/V.  His fatigue is better and he denies any SOB or LE edema.  Weight is now up to 153.6lbs from 151lbs.  His baseline weight when he feels good is 165lb.     The patient does not have symptoms concerning for COVID-19 infection (fever, chills, cough, or new shortness of breath).    Prior CV studies:   The following studies were reviewed today:  labs  Past Medical History:  Diagnosis Date   Acute on chronic combined systolic and diastolic CHF (congestive heart failure) ([place]) 04/16/2018   Anemia    Arthritis    Chronic kidney disease    Chronic systolic CHF (congestive heart failure) ([place]) 07/04/2018   Coronary artery disease 02/2012   a. s/p stenting in 2013  b. s/p CABGx2V (SVG--> PDA, SVG--> LCx).   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   OSA (obstructive sleep apnea) 07/04/2018   Severe obstructive sleep apnea with an AHI of 30/h and mild central sleep apnea at 13.7/h with oxygen desaturations as low as 79%   S/P TAVR (transcatheter aortic valve replacement)  05/15/2018   29 mm Edwards Sapien 3 transcatheter heart valve placed via percutaneous right transfemoral approach    Severe aortic stenosis    Past Surgical History:  Procedure Laterality Date   COLONOSCOPY  07/25/2012   Procedure: COLONOSCOPY;  Surgeon: Winfield Cunas., MD;  Location: [name] END[facility]COPY;  Service: Endoscopy;  Laterality: N/A;   COLONOSCOPY N/A 12/02/2013   Procedure: COLONOSCOPY;  Surgeon: Winfield Cunas., MD;  Location: [facility] ENDOSCOPY;  Service: Endoscopy;  Laterality: N/A;   CORONARY ARTERY BYPASS GRAFT N/A 01/04/2013   Procedure: CORONARY ARTERY BYPASS GRAFTING (CABG) times two using right saphenous vein harvested with endoscope.;  Surgeon: Ivin Poot, MD;  Location: [place];  Service: Open Heart Surgery;  Laterality: N/A;   ESOPHAGOGASTRODUODENOSCOPY  07/20/2012   Procedure: ESOPHAGOGASTRODUODENOSCOPY (EGD);  Surgeon: Winfield Cunas., MD;  Location: [facility] ENDOSCOPY;  Service: Endoscopy;  Laterality: N/A;   ESOPHAGOGASTRODUODENOSCOPY N/A 12/02/2013   Procedure: ESOPHAGOGASTRODUODENOSCOPY (EGD);  Surgeon: Winfield Cunas., MD;  Location: [facility];  Service: Endoscopy;  Laterality: N/A;   HOT HEMOSTASIS N/A 12/02/2013   Procedure: HOT HEMOSTASIS (ARGON PLASMA COAGULATION/BICAP);  Surgeon: Winfield Cunas., MD;  Location: [facility];  Service: Endoscopy;  Laterality: N/A;   INTRAOPERATIVE TRANSESOPHAGEAL ECHOCARDIOGRAM N/A 01/04/2013   Procedure: INTRAOPERATIVE TRANSESOPHAGEAL ECHOCARDIOGRAM;  Surgeon: Ivin Poot, MD;  Location: [place];  Service: Open Heart Surgery;  Laterality: N/A;   INTRAOPERATIVE TRANSTHORACIC ECHOCARDIOGRAM N/A 05/15/2018   Procedure: INTRAOPERATIVE TRANSTHORACIC ECHOCARDIOGRAM;  Surgeon: Burnell Blanks, MD;  Location: [place];  Service: Open Heart Surgery;  Laterality: N/A;   IR THORACENTESIS ASP PLEURAL SPACE W/IMG GUIDE  10/19/2018   LEFT HEART CATHETERIZATION WITH CORONARY ANGIOGRAM N/A 03/06/2012   Procedure: LEFT HEART  CATHETERIZATION WITH CORONARY ANGIOGRAM;  Surgeon: [name], MD;  Location: [facility] C[facility];  Service: Cardiovascular;  Laterality: N/A;   LITHOTRIPSY     [place] Left 01/04/2013   Procedure: RADIAL ARTERY HARVEST;  Surgeon: Ivin Poot, MD;  Location: [facility];  Service: Vascular;  Laterality: Left;  Artery not havested. Unsuitable for use.   resection mediastinal seminonma     RIGHT/LEFT HEART CATH AND CORONARY/GRAFT ANGIOGRAPHY N/A 11/15/2016   Procedure: Right/Left Heart Cath and Coronary/Graft Angiography;  Surgeon: Leonie Man, MD;  Location: [facility] CV LAB;  Service: Cardiovascular;  Laterality: N/A;   RIGHT/LEFT HEART CATH AND CORONARY/GRAFT ANGIOGRAPHY N/A 04/13/2018   Procedure: RIGHT/LEFT HEART CATH AND CORONARY/GRAFT ANGIOGRAPHY;  Surgeon: Jolaine Artist, MD;  Location: [place] CV LAB;  Service: Cardiovascular;  Laterality: N/A;   TRANSCATHETER AORTIC VALVE REPLACEMENT, TRANSFEMORAL  05/15/2018   TRANSCATHETER AORTIC VALVE REPLACEMENT, TRANSFEMORAL N/A 05/15/2018   Procedure: TRANSCATHETER AORTIC VALVE REPLACEMENT, TRANSFEMORAL;  Surgeon: Burnell Blanks, MD;  Location: [place];  Service: Open  Heart Surgery;  Laterality: N/A;     Current Meds  Medication Sig   amitriptyline (ELAVIL) 25 MG tablet Take 25 mg by mouth at bedtime.    amoxicillin (AMOXIL) 500 MG capsule Take 1 capsule (500 mg total) by mouth as directed. Take 4 tablets (2000 mg ) 1 hour prior to dental procedures.   aspirin 81 MG chewable tablet Chew 1 tablet (81 mg total) by mouth daily.   B Complex-C (SUPER B COMPLEX PO) Take 1 tablet by mouth daily.   B-D ULTRAFINE III SHORT PEN 31G X 8 MM MISC 1 each by Other route daily as needed (GLUCOSE TESTING (INSULINE NEEDLE)).    Cholecalciferol (VITAMIN D-3) 5000 units TABS Take 5,000 Units by mouth daily.   furosemide (LASIX) 20 MG tablet Take 1 tablet (20 mg total) by mouth daily. (Patient taking  differently: Take 20 mg by mouth daily. On hold per Dr. Radford Pax 11/15/18)   hydroxypropyl methylcellulose / hypromellose (ISOPTO TEARS / GONIOVISC) 2.5 % ophthalmic solution Place 1 drop into both eyes 4 (four) times daily as needed for dry eyes.   insulin detemir (LEVEMIR) 100 UNIT/ML injection Inject 40 Units into the skin every morning.   levothyroxine (SYNTHROID, LEVOTHROID) 50 MCG tablet Take 50 mcg by mouth daily before breakfast.    metFORMIN (GLUCOPHAGE) 500 MG tablet Take 2 tablets (1,000 mg total) by mouth 2 (two) times daily with a meal.   metoprolol succinate (TOPROL-XL) 50 MG 24 hr tablet Take 1 tablet (50 mg total) by mouth daily. Take with or immediately following a meal. Do not take if systolic blood pressure is less than 110.   Multiple Vitamin (MULTIVITAMIN) tablet Take 1 tablet by mouth daily.   ONE TOUCH ULTRA TEST test strip 1 each by Other route as needed for other.    simvastatin (ZOCOR) 20 MG tablet Take 20 mg by mouth every evening.    zolpidem (AMBIEN) 10 MG tablet Take 10 mg by mouth at bedtime.      Allergies:   Lipitor [atorvastatin] and Adhesive [tape]   Social History   Tobacco Use   Smoking status: Former Smoker    Last attempt to quit: 03/06/1985    Years since quitting: 33.7   Smokeless tobacco: Never Used  Substance Use Topics   Alcohol use: No   Drug use: No     Family Hx: The patient's family history includes Cancer in his brother; Diabetes in his father, mother, and sister; Heart failure in his mother; Hypertension in his mother.  ROS:   Please see the history of present illness.     All other systems reviewed and are negative.   Labs/Other Tests and Data Reviewed:    Recent Labs: 05/11/2018: B Natriuretic Peptide 740.6 05/16/2018: Magnesium 1.9; TSH 3.157 10/25/2018: ALT 33 11/14/2018: BUN 26; Creatinine, Ser 1.25; Hemoglobin 14.7; NT-Pro BNP 935; Platelets 182; Potassium 4.6; Sodium 141   Recent Lipid Panel Lab Results    Component Value Date/Time   CHOL 95 04/12/2018 02:16 AM   CHOL 104 03/28/2018 08:50 AM   TRIG 36 04/12/2018 02:16 AM   HDL 33 (L) 04/12/2018 02:16 AM   HDL 35 (L) 03/28/2018 08:50 AM   CHOLHDL 2.9 04/12/2018 02:16 AM   LDLCALC 55 04/12/2018 02:16 AM   LDLCALC 58 03/28/2018 08:50 AM    Wt Readings from Last 3 Encounters:  11/15/18 155 lb (70.3 kg)  11/14/18 151 lb (68.5 kg)  11/13/18 151 lb (68.5 kg)     Objective:  Vital Signs:  BP 96/60    Pulse 96    Resp 18    Ht 5\' 11"  (1.803 m)    Wt 155 lb (70.3 kg)    BMI 21.62 kg/m    Well nourished, well developed male in no acute distress. Well appearing, alert and conversant, regular work of breathing,  good skin color  Eyes- anicteric mouth- oral mucosa is pink  neuro- grossly intact skin- no apparent rash or lesions or cyanosis   ASSESSMENT & PLAN:    1. Chronic systolic CHF- He recently had some problems with SOB and a BNP was elevated at 1501.  His Lasix was increased to 40mg  BID for 2 days and then down to 20mg  daily from his usual daily does of 20mg  daily.  He has dropped 11lbs since then and his BP has been running as low as 80/73mmHg and he has been feeling bad with severe fatigue and today had nausea and vomiting after his BP dropped.  He was recently started on Entresto 24-26mg  BID. He has not had any further LE edema or SOB since dropping 11lbs.  His baseline weight is 162lbs and he went down to 151lbs the other day and felt terrible. His echo in 2019 showed moderate to severely reduced LV function with EF 30 to 35% with hypokinesis of the anterior septum and inferior wall.   His Lasix has been on hold as well as Entresto due to hypotension from overdiuresis.  He has no SOB or LE edema and weight is up to 153.6lbs.  His baseline weight when he feels good is 165lbs.  I have instructed him to continue to hold Lasix until his weight is 163lbs and then start Lasix 20mg  daily.  His labs yesterday were very stable with a  creatinine of 1.25 and normal K+.  BNP was elevated but lower than last month when he was volume overloaded. I have asked him to call if he develops SOB or LE edema.  Will will hold off on adding Entresto until he appears euvolemic.    2. ASCAD- s/p non-STEMI in 2013 treated with PCI of the LCx/OM. LHC in 5/14 demonstrated severe 2 vessel CAD with critical in-stent restenosis in the LCx. He underwent CABG (SVG-PDA, SVG-LCx).Repeat left heart cath showed patent SVG to RPDA, SVG to OM1, 50% ostial D1, 40% proximal to mid LAD, occluded ostial RCA, occluded ostial left circumflex, 40% ostial LAD.He denies any chest pain or SOB.  He will continue on ASA, BB and statin.   3. HTN- Now BP on low side.  He checked his BP this am and it has improved to 96/15mmHg after holding Entresto and Lasix.   He will continue on Toprol 50mg  daily.  I suspect his BP will continue to improve with normalization of blood volume.   4. Severe low gradient low outputAS- S/P TAVR 10-1-19with29 mm EdwardsSapein3 THV via transfemoral approach.2D echo 07/04/2018 showed a stable aortic valve TAVR with mild perivalvular AR. Mean aortic valve gradient 6 mmHg. He will continue on ASA 81mg  daily.  Plavix has now been stopped as he is 6 months out from TAVR.  5. Dyslipidemia-his LDL goal is less than 70. His LDL was 55 on 04/12/2018. He will continue on Zocor 40 mg daily   6.  LLL lung mass - this was noted on cxray and Chest C showed enlarging irregular mass like focus of consolidation in the LLL 3.5 x 2.2cm suspicious for malignancy.  PET scan revealed an area of hypermetabolic  activity worrisome for adenoCA.  He underwent thoracentesis which was nondiagnostic.  He is underwent CT guided lung bx yesterday and pathology is pending.     COVID-19 Education: The signs and symptoms of COVID-19 were discussed with the patient and how to seek care for testing (follow up with PCP or arrange E-visit).  The importance of  social distancing was discussed today.  Patient Risk:   After full review of this patient's clinical status, I feel that they are at least moderate risk at this time.  Time:   Today, I have spent 25 minutes reviewing the chart, patient labs from 4/1 and talking with the patient about his CHF and developing a plan going forward with telehealth technology.     Medication Adjustments/Labs and Tests Ordered: Current medicines are reviewed at length with the patient today.  Concerns regarding medicines are outlined above.  Tests Ordered: No orders of the defined types were placed in this encounter.  Medication Changes: No orders of the defined types were placed in this encounter.   Disposition:  Follow up in 4 day(s)  Signed, Fransico Him, MD  11/16/2018 9:49 AM    [facility]

## 2018-11-15 NOTE — Patient Instructions (Addendum)
Medication Instructions:  -continue Toprol XL -hold lasix until your weight gets back to 163 pounds, THEN start lasix 20 mg once a day -no Entresto for now  If you need a refill on your cardiac medications before your next appointment, please call your pharmacy.   Lab work: none If you have labs (blood work) drawn today and your tests are completely normal, you will receive your results only by: Marland Kitchen MyChart Message (if you have MyChart) OR . A paper copy in the mail If you have any lab test that is abnormal or we need to change your treatment, we will call you to review the results.  Testing/Procedures: none  . Follow-Up: We will arrange for another virtual visit for Monday, November 19, 2018 with Dr. Radford Pax.  Any Other Special Instructions Will Be Listed Below (If Applicable). Call if you experience any problems with shortness of breath in the meantime.    Addendum:  I spoke with patient's wife.  She felt 1:30 pm was a good time for virtual appointment Monday with Dr. Radford Pax.  11/15/18 4:30 pm/mwilson RN

## 2018-11-16 ENCOUNTER — Telehealth: Payer: Self-pay | Admitting: *Deleted

## 2018-11-16 ENCOUNTER — Telehealth: Payer: Self-pay

## 2018-11-16 ENCOUNTER — Telehealth: Payer: Self-pay | Admitting: Thoracic Surgery (Cardiothoracic Vascular Surgery)

## 2018-11-16 ENCOUNTER — Telehealth: Payer: Self-pay | Admitting: Cardiology

## 2018-11-16 ENCOUNTER — Encounter (HOSPITAL_COMMUNITY): Payer: Self-pay

## 2018-11-16 NOTE — Telephone Encounter (Signed)
-----   Message from [name], MD sent at 11/15/2018 10:38 PM EDT ----- Good AHI on PAP but needs to improve compliance

## 2018-11-16 NOTE — Telephone Encounter (Signed)
      [address]       [place] [address]             [phone]     I called Mr. Cotton to discuss results of his lung biopsy. Informed him it was lung cancer.   Lung, needle/core biopsy(ies), Left lower Lobe - INVASIVE SQUAMOUS CELL CARCINOMA ARISING IN A BACKGROUND OF HIGH GRADE DYSPLASIA. SEE NOTE. Diagnosis Note Dr Jeannie Done has reviewed this case and concurs with the above interpretation. Dr. Roxan Hockey was paged on 11/15/2018. (NK:kh 11/15/2018) Jaquita Folds MD Pathologist, Electronic Signature  Will arrange for him to be seen in our [place]. Roxan Hockey, MD [facility] [phone]

## 2018-11-16 NOTE — Telephone Encounter (Signed)
TELEPHONE CALL NOTE  This patient has been deemed a candidate for follow-up tele-health visit to limit community exposure during the Covid-19 pandemic. I spoke with the patient via phone to discuss instructions. This has been outlined on the patient's AVS (dotphrase: hcevisitinfo). The patient was advised to review the section on consent for treatment as well. The patient will receive a phone call 2-3 days prior to their E-Visit at which time consent will be verbally confirmed. A Virtual Office Visit appointment type has been scheduled for 1:30 pm on 11/19/18 with Dr. Radford Pax, with "VIDEO" or "TELEPHONE" in the appointment notes - patient prefers type.  I have either confirmed the patient is active in MyChart or offered to send sign-up link to phone/email via Mychart icon beside patient's photo.YES  Sarina Ill, RN 11/16/2018 2:49 PM  TELEPHONE CALL NOTE  [name] has been deemed a candidate for a follow-up tele-health visit to limit community exposure during the Covid-19 pandemic. I spoke with the patient via phone to ensure availability of phone/video source, confirm preferred email & phone number, and discuss instructions and expectations.  I reminded [name] to be prepared with any vital sign and/or heart rhythm information that could potentially be obtained via home monitoring, at the time of his visit. I reminded [name] to expect a phone call at the time of his visit if his visit.  Did the patient verbally acknowledge consent to treatment? Kennedy, RN 11/16/2018 2:50 PM   DOWNLOADING THE [place], go to [employer] and type in WebEx in the search bar. [place] [employer], the blue/green circle. The app is free but as with any other app downloads, their phone may require them to verify saved payment information or Apple password. The patient does NOT have to create an account.  - If Android, ask patient to go to  [employer] and type in WebEx in the search bar. [place] [employer], the blue/green circle. The app is free but as with any other app downloads, their phone may require them to verify saved payment information or Android password. The patient does NOT have to create an account.   CONSENT FOR TELE-HEALTH VISIT - PLEASE REVIEW  I hereby voluntarily request, consent and authorize [facility] and its employed or contracted physicians, physician assistants, nurse practitioners or other licensed health care professionals (the Practitioner), to provide me with telemedicine health care services (the "Services") as deemed necessary by the treating Practitioner. I acknowledge and consent to receive the Services by the Practitioner via telemedicine. I understand that the telemedicine visit will involve communicating with the Practitioner through live audiovisual communication technology and the disclosure of certain medical information by electronic transmission. I acknowledge that I have been given the opportunity to request an in-person assessment or other available alternative prior to the telemedicine visit and am voluntarily participating in the telemedicine visit.  I understand that I have the right to withhold or withdraw my consent to the use of telemedicine in the course of my care at any time, without affecting my right to future care or treatment, and that the Practitioner or I may terminate the telemedicine visit at any time. I understand that I have the right to inspect all information obtained and/or recorded in the course of the telemedicine visit and may receive copies of available information for a reasonable fee.  I understand that some of the potential  risks of receiving the Services via telemedicine include:  Marland Kitchen Delay or interruption in medical evaluation due to technological equipment failure or disruption; . Information transmitted may not be sufficient (e.g. poor resolution of  images) to allow for appropriate medical decision making by the Practitioner; and/or  . In rare instances, security protocols could fail, causing a breach of personal health information.  Furthermore, I acknowledge that it is my responsibility to provide information about my medical history, conditions and care that is complete and accurate to the best of my ability. I acknowledge that Practitioner's advice, recommendations, and/or decision may be based on factors not within their control, such as incomplete or inaccurate data provided by me or distortions of diagnostic images or specimens that may result from electronic transmissions. I understand that the practice of medicine is not an exact science and that Practitioner makes no warranties or guarantees regarding treatment outcomes. I acknowledge that I will receive a copy of this consent concurrently upon execution via email to the email address I last provided but may also request a printed copy by calling the office of [place].    I understand that my insurance will be billed for this visit.   I have read or had this consent read to me. . I understand the contents of this consent, which adequately explains the benefits and risks of the Services being provided via telemedicine.  . I have been provided ample opportunity to ask questions regarding this consent and the Services and have had my questions answered to my satisfaction. . I give my informed consent for the services to be provided through the use of telemedicine in my medical care  By participating in this telemedicine visit I agree to the above.

## 2018-11-16 NOTE — Telephone Encounter (Signed)
Informed patient of compliance results and verbalized understanding was indicated. Patient is aware and agreeable to AHI being within range at 5.1. Patient is aware and agreeable to being in compliance with machine usage. Patient is aware and agreeable to no change in current pressures.

## 2018-11-16 NOTE — Telephone Encounter (Signed)
Form from [facility] placed in box for Dr. Radford Pax to sign. 11/16/18 vlm

## 2018-11-16 NOTE — Telephone Encounter (Signed)
Oncology Nurse Navigator Documentation  Oncology Nurse Navigator Flowsheets 11/16/2018  Navigator Location [facility]  Referral date to RadOnc/MedOnc -  Navigator Encounter Type Telephone/I received a message from Dr. Roxan Hockey that patient needs to be seen with Rad Onc.  I called patient and updated him.  He and wife verbalized understanding of appt time and place.  I ask patient if he had visited outside of the state recently, had a cough or fever.  He said no to all questions.  I also updated him and wife that he would not be able to have visitors with him at visit.  I updated that the wife can use cell phones to call in while he is with doctor.    Telephone Outgoing Call  Abnormal Finding Date -  Multidisiplinary Clinic Date -  Patient Visit Type -  Treatment Phase Pre-Tx/Tx Discussion  Barriers/Navigation Needs Education;Coordination of Care  Education Other  Interventions Coordination of Care;Education  Coordination of Care Appts  Education Method Verbal  Support Groups/Services -  Acuity Level 2  Time Spent with Patient 30

## 2018-11-16 NOTE — Telephone Encounter (Signed)
Spoke with the wife, she agreed to change to 9:30 am on 11/19/18.

## 2018-11-18 NOTE — Progress Notes (Signed)
Virtual Visit via Video Note   This visit type was conducted due to national recommendations for restrictions regarding the COVID-19 Pandemic (e.g. social distancing) in an effort to limit this patient's exposure and mitigate transmission in our community.  Due to his co-morbid illnesses, this patient is at least at moderate risk for complications without adequate follow up.  This format is felt to be most appropriate for this patient at this time.  All issues noted in this document were discussed and addressed.  A limited physical exam was performed with this format.  Please refer to the patient's chart for his consent to telehealth for [facility].  Evaluation Performed:  Follow-up visit  This visit type was conducted due to national recommendations for restrictions regarding the COVID-19 Pandemic (e.g. social distancing).  This format is felt to be most appropriate for this patient at this time.  All issues noted in this document were discussed and addressed.  No physical exam was performed (except for noted visual exam findings with Video Visits).  Please refer to the patient's chart (MyChart message for video visits and phone note for telephone visits) for the patient's consent to telehealth for [facility].  Date:  11/19/2018   ID:  [name], DOB Jun 15, 1951, MRN [id number]  Patient Location:  Home  Provider location:   [place]  PCP:  [name], MD  Cardiologist:  Fransico Him, MD  Electrophysiologist:  None   Chief Complaint:  CHF, orthostatic hypotension  History of Present Illness:    [name] is a 68 y.o. male who presents via audio/video conferencing for a telehealth visit today.    [name] a 68 y.o.malewith a hx of CAD, aortic stenosis and systolic HF. Hehad anon-STEMI in 2013 treated with PCI of the LCx/OM. LHC in 5/14 demonstrated severe 2 vessel CAD with critical in-stent restenosis in the LCx. He underwent CABG (SVG-PDA, SVG-LCx). He  also has a history of aortic stenosis. Echo 10/2016 showed moderately reduced LVF at 35-40% with diffuse HK, mild AS and moderate AR. Right and Left heart cath showed mild to moderate AS with AVA 0.837cm2 and EF 45% with patent SVG to RPDA and SVG to OM1 With occluded native vessels. He ultimately was diagnosed with low gradient aortic stenosis with AI and underwent TAVR with a 29 mm EdwardsSapein3 THV via transfemoral approach after being admitted with acute exacerbation of CHF on 05/15/2018.  He has had some  problems with SOB and a BNP was elevated at 1501. His Lasix was increased to 40mg  BID for 2 days and then down to 20mg  daily from his usual daily does of 20mg  daily. On televisit a few days ago he had dropped 11lbs  and his BP had been running as low as 80/83mmHg and he was feeling bad with severe fatigue and  nausea and vomiting after his BP dropped. He had recently started on Entresto 24-26mg  BID. He has not had any further LE edema or SOB since dropping 11lbs. His baseline weight is 162lbs and he is down to 151lbs.I instructed him to stop his Lasix and Entresto and decrease Toprol to 50mg  daily.    He was seen back for Tele visit on 4/3 and was doing better.  His BP had improved some and weight was up 2lbs from prior visit.  His dry weight is around 165lbs and at this weight he feels good.  He has not had any further N/V or dizziness.  He is here today for tele  visit followup and is doing well.  He denies any chest pain or pressure, SOB, DOE, PND, orthopnea, LE edema, dizziness, palpitations or syncope. He has not had any weak spells.  He is still holding his Lasix and weight is up to 157lbs.   His Labs on 4/1 looked good with creatinine 1.2 which had improved from prior.    The patient does not have symptoms concerning for COVID-19 infection (fever, chills, cough, or new shortness of breath).    Prior CV studies:   The following studies were reviewed today:  BMET 4/1  Past  Medical History:  Diagnosis Date  . Acute on chronic combined systolic and diastolic CHF (congestive heart failure) ([place]) 04/16/2018  . Anemia   . Arthritis   . Chronic kidney disease   . Chronic systolic CHF (congestive heart failure) ([place]) 07/04/2018  . Coronary artery disease 02/2012   a. s/p stenting in 2013  b. s/p CABGx2V (SVG--> PDA, SVG--> LCx).  . Diabetes mellitus    neuropathy  insulin dependent  . Dyslipidemia   . Erectile dysfunction   . GERD (gastroesophageal reflux disease)   . History of CVA (cerebrovascular accident)   . History of kidney stones   . Hx of radiation therapy to mediastinum 1985  . Hypertension   . 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 OSA (obstructive sleep apnea) 07/04/2018   Severe obstructive sleep apnea with an AHI of 30/h and mild central sleep apnea at 13.7/h with oxygen desaturations as low as 79%  . S/P TAVR (transcatheter aortic valve replacement) 05/15/2018   29 mm Edwards Sapien 3 transcatheter heart valve placed via percutaneous right transfemoral approach   . Severe aortic stenosis    Past Surgical History:  Procedure Laterality Date  . COLONOSCOPY  07/25/2012   Procedure: COLONOSCOPY;  Surgeon: Winfield Cunas., MD;  Location: [facility] ENDOSCOPY;  Service: Endoscopy;  Laterality: N/A;  . COLONOSCOPY N/A 12/02/2013   Procedure: COLONOSCOPY;  Surgeon: Winfield Cunas., MD;  Location: [facility] ENDOSCOPY;  Service: Endoscopy;  Laterality: N/A;  . CORONARY ARTERY BYPASS GRAFT N/A 01/04/2013   Procedure: CORONARY ARTERY BYPASS GRAFTING (CABG) times two using right saphenous vein harvested with endoscope.;  Surgeon: Ivin Poot, MD;  Location: [place];  Service: Open Heart Surgery;  Laterality: N/A;  . ESOPHAGOGASTRODUODENOSCOPY  07/20/2012   Procedure: ESOPHAGOGASTRODUODENOSCOPY (EGD);  Surgeon: Winfield Cunas., MD;  Location: [facility] ENDOSCOPY;  Service: Endoscopy;  Laterality: N/A;  . ESOPHAGOGASTRODUODENOSCOPY N/A 12/02/2013   Procedure:  ESOPHAGOGASTRODUODENOSCOPY (EGD);  Surgeon: Winfield Cunas., MD;  Location: [facility] ENDOSCOPY;  Service: Endoscopy;  Laterality: N/A;  . HOT HEMOSTASIS N/A 12/02/2013   Procedure: HOT HEMOSTASIS (ARGON PLASMA COAGULATION/BICAP);  Surgeon: Winfield Cunas., MD;  Location: [facility] ENDOSCOPY;  Service: Endoscopy;  Laterality: N/A;  . INTRAOPERATIVE TRANSESOPHAGEAL ECHOCARDIOGRAM N/A 01/04/2013   Procedure: INTRAOPERATIVE TRANSESOPHAGEAL ECHOCARDIOGRAM;  Surgeon: Ivin Poot, MD;  Location: [place];  Service: Open Heart Surgery;  Laterality: N/A;  . INTRAOPERATIVE TRANSTHORACIC ECHOCARDIOGRAM N/A 05/15/2018   Procedure: INTRAOPERATIVE TRANSTHORACIC ECHOCARDIOGRAM;  Surgeon: Burnell Blanks, MD;  Location: [place];  Service: Open Heart Surgery;  Laterality: N/A;  . IR THORACENTESIS ASP PLEURAL SPACE W/IMG GUIDE  10/19/2018  . LEFT HEART CATHETERIZATION WITH CORONARY ANGIOGRAM N/A 03/06/2012   Procedure: LEFT HEART CATHETERIZATION WITH CORONARY ANGIOGRAM;  Surgeon: [name], MD;  Location: [place] CATH LAB;  Service: Cardiovascular;  Laterality: N/A;  . LITHOTRIPSY    . MEDIASTERNOTOMY  [place] Left 01/04/2013   Procedure: RADIAL ARTERY HARVEST;  Surgeon: Ivin Poot, MD;  Location: [place];  Service: Vascular;  Laterality: Left;  Artery not havested. Unsuitable for use.  . resection mediastinal seminonma    . RIGHT/LEFT HEART CATH AND CORONARY/GRAFT ANGIOGRAPHY N/A 11/15/2016   Procedure: Right/Left Heart Cath and Coronary/Graft Angiography;  Surgeon: Leonie Man, MD;  Location: [place] CV LAB;  Service: Cardiovascular;  Laterality: N/A;  . RIGHT/LEFT HEART CATH AND CORONARY/GRAFT ANGIOGRAPHY N/A 04/13/2018   Procedure: RIGHT/LEFT HEART CATH AND CORONARY/GRAFT ANGIOGRAPHY;  Surgeon: Jolaine Artist, MD;  Location: [place] CV LAB;  Service: Cardiovascular;  Laterality: N/A;  . TRANSCATHETER AORTIC VALVE REPLACEMENT, TRANSFEMORAL  05/15/2018  . TRANSCATHETER AORTIC  VALVE REPLACEMENT, TRANSFEMORAL N/A 05/15/2018   Procedure: TRANSCATHETER AORTIC VALVE REPLACEMENT, TRANSFEMORAL;  Surgeon: Burnell Blanks, MD;  Location: [place];  Service: Open Heart Surgery;  Laterality: N/A;     Current Meds  Medication Sig  . amitriptyline (ELAVIL) 25 MG tablet Take 25 mg by mouth at bedtime.   Marland Kitchen amoxicillin (AMOXIL) 500 MG capsule Take 1 capsule (500 mg total) by mouth as directed. Take 4 tablets (2000 mg ) 1 hour prior to dental procedures.  Marland Kitchen aspirin 81 MG chewable tablet Chew 1 tablet (81 mg total) by mouth daily.  . B Complex-C (SUPER B COMPLEX PO) Take 1 tablet by mouth daily.  . B-D ULTRAFINE III SHORT PEN 31G X 8 MM MISC 1 each by Other route daily as needed (GLUCOSE TESTING (INSULINE NEEDLE)).   . Cholecalciferol (VITAMIN D-3) 5000 units TABS Take 5,000 Units by mouth daily.  . furosemide (LASIX) 20 MG tablet Take 1 tablet (20 mg total) by mouth daily. (Patient taking differently: Take 20 mg by mouth daily. On hold per Dr. Radford Pax 11/15/18)  . hydroxypropyl methylcellulose / hypromellose (ISOPTO TEARS / GONIOVISC) 2.5 % ophthalmic solution Place 1 drop into both eyes 4 (four) times daily as needed for dry eyes.  . insulin detemir (LEVEMIR) 100 UNIT/ML injection Inject 40 Units into the skin every morning.  Marland Kitchen levothyroxine (SYNTHROID, LEVOTHROID) 50 MCG tablet Take 50 mcg by mouth daily before breakfast.   . metFORMIN (GLUCOPHAGE) 500 MG tablet Take 2 tablets (1,000 mg total) by mouth 2 (two) times daily with a meal.  . metoprolol succinate (TOPROL-XL) 50 MG 24 hr tablet Take 1 tablet (50 mg total) by mouth daily. Take with or immediately following a meal. Do not take if systolic blood pressure is less than 110.  . Multiple Vitamin (MULTIVITAMIN) tablet Take 1 tablet by mouth daily.  . ONE TOUCH ULTRA TEST test strip 1 each by Other route as needed for other.   . simvastatin (ZOCOR) 20 MG tablet Take 20 mg by mouth every evening.   . zolpidem (AMBIEN) 10 MG  tablet Take 10 mg by mouth at bedtime.      Allergies:   Lipitor [atorvastatin] and Adhesive [tape]   Social History   Tobacco Use  . Smoking status: Former Smoker    Last attempt to quit: 03/06/1985    Years since quitting: 33.7  . Smokeless tobacco: Never Used  Substance Use Topics  . Alcohol use: No  . Drug use: No     Family Hx: The patient's family history includes Cancer in his brother; Diabetes in his father, mother, and sister; Heart failure in his mother; Hypertension in his mother.  ROS:   Please see the history of present  illness.     All other systems reviewed and are negative.   Labs/Other Tests and Data Reviewed:    Recent Labs: 05/11/2018: B Natriuretic Peptide 740.6 05/16/2018: Magnesium 1.9; TSH 3.157 10/25/2018: ALT 33 11/14/2018: BUN 26; Creatinine, Ser 1.25; Hemoglobin 14.7; NT-Pro BNP 935; Platelets 182; Potassium 4.6; Sodium 141   Recent Lipid Panel Lab Results  Component Value Date/Time   CHOL 95 04/12/2018 02:16 AM   CHOL 104 03/28/2018 08:50 AM   TRIG 36 04/12/2018 02:16 AM   HDL 33 (L) 04/12/2018 02:16 AM   HDL 35 (L) 03/28/2018 08:50 AM   CHOLHDL 2.9 04/12/2018 02:16 AM   LDLCALC 55 04/12/2018 02:16 AM   LDLCALC 58 03/28/2018 08:50 AM    Wt Readings from Last 3 Encounters:  11/19/18 157 lb (71.2 kg)  11/15/18 155 lb (70.3 kg)  11/14/18 151 lb (68.5 kg)     Objective:    Vital Signs:  BP 100/60   Pulse 100   Ht 5\' 11"  (1.803 m)   Wt 157 lb (71.2 kg)   BMI 21.90 kg/m    Well nourished, well developed male in no acute distress. Well appearing, alert and conversant, regular work of breathing,  good skin color  Eyes- anicteric mouth- oral mucosa is pink  neuro- grossly intact skin- no apparent rash or lesions or cyanosis   ASSESSMENT & PLAN:    1. Chronic systolic CHF-He recently had some problems with SOB and a BNP was elevated at 1501. His Lasix was increased to 40mg  BID for 2 days and then down to 20mg  daily from his usual  daily does of 20mg  daily. He has dropped 11lbs since then and his BP has been running as low as 80/52mmHg and he has been feeling bad with severe fatigue and today had nausea and vomiting after his BP dropped. He was recently started on Entresto 24-26mg  BID. He has not had any further LE edema or SOB since dropping 11lbs. His baseline weight is 162lbs and he went down to 151lbs the other day and felt terrible.His echo in 2019 showed moderate to severely reduced LV function with EF 30 to 35% with hypokinesis of the anterior septum and inferior wall.  His Lasix has been on hold as well as Entresto due to hypotension from overdiuresis.  On 4/3 BP had slightly improved and weight was up 2lbs but still down 12lbs from his baseline weight.  He has no SOB or LE edema   His labs on 4/1 were very stable with a creatinine of 1.25 and normal K+.  BNP was elevated but lower than last month when he was volume overloaded.   He has continued to hold Lasix and Entresto and continue on Toprol XL 50mg  daily and was instructed to restart Lasix 20mg  daily once his weight reached 163lbs.  He has not had any SOB or LE edema.  His weight is today is 157lbs.  He will restart Lasix 20mg  daily once weight hits 163lbs.  He knows to call if his weight goes above 165lbs, develops SOB or LE edema.  2. ASCAD- s/p non-STEMI in 2013 treated with PCI of the LCx/OM. LHC in 5/14 demonstrated severe 2 vessel CAD with critical in-stent restenosis in the LCx. He underwent CABG (SVG-PDA, SVG-LCx).Repeat left heart cath showed patent SVG to RPDA, SVG to OM1, 50% ostial D1, 40% proximal to mid LAD, occluded ostial RCA, occluded ostial left circumflex, 40% ostial LAD.He denies any chest pain or SOB. He will continue on ASA,  BB and statin.   3. HTN-BP continues to improved since I talked with him on 4/3. He has only been on Toprol due to low BP on Entresto and Lasix.    4. Severe low gradient low outputAS- S/P TAVR  10-1-19with29 mm EdwardsSapein3 THV via transfemoral approach.2D echo 07/04/2018 showed a stable aortic valve TAVR with mild perivalvular AR. Mean aortic valve gradient 6 mmHg. He will continue on ASA 81mg  daily.  Plavix has now been stopped as he is 6 months out from TAVR.  5. Dyslipidemia-his LDL goal is less than 70. His LDL was 55 on 04/12/2018. He will continue on Zocor 40 mg daily   6. LLL lung mass- this was noted on cxray and Chest C showed enlarging irregular mass like focus of consolidation in the LLL 3.5 x 2.2cm suspicious for malignancy. PET scan revealed an area of hypermetabolic activity worrisome for adenoCA. He underwent thoracentesis which was nondiagnostic. He is underwent CT guided lung bx yesterday and pathology showed invasive squamous cell CA in a background of atypia.  He is aware of the results and has an appt with Radiation Oncology on Thursday.  COVID-19 Education: The signs and symptoms of COVID-19 were discussed with the patient and how to seek care for testing (follow up with PCP or arrange E-visit).  The importance of social distancing was discussed today.  Patient Risk:   After full review of this patient's clinical status, I feel that they are at least moderate risk at this time.  Time:   Today, I have spent 20 minutes with the patient reviewing chart and discussing medical problems including CHF, volume depletion secondary to diuretics and reviewing symptoms of COVID 19 and the ways to protect against contracting the virus with telehealth technology.      Medication Adjustments/Labs and Tests Ordered: Current medicines are reviewed at length with the patient today.  Concerns regarding medicines are outlined above.  Tests Ordered: No orders of the defined types were placed in this encounter.  Medication Changes: No orders of the defined types were placed in this encounter.   Disposition:  Follow up in 2 week(s)  Signed, Fransico Him, MD   11/19/2018 10:01 AM    [facility]

## 2018-11-19 ENCOUNTER — Other Ambulatory Visit: Payer: Self-pay

## 2018-11-19 ENCOUNTER — Encounter: Payer: Self-pay | Admitting: Cardiology

## 2018-11-19 ENCOUNTER — Telehealth (INDEPENDENT_AMBULATORY_CARE_PROVIDER_SITE_OTHER): Payer: Medicare Other | Admitting: Cardiology

## 2018-11-19 DIAGNOSIS — I5022 Chronic systolic (congestive) heart failure: Secondary | ICD-10-CM

## 2018-11-19 DIAGNOSIS — Z952 Presence of prosthetic heart valve: Secondary | ICD-10-CM

## 2018-11-19 DIAGNOSIS — E869 Volume depletion, unspecified: Secondary | ICD-10-CM

## 2018-11-19 DIAGNOSIS — I350 Nonrheumatic aortic (valve) stenosis: Secondary | ICD-10-CM

## 2018-11-19 LAB — FUNGUS CULTURE RESULT

## 2018-11-19 LAB — FUNGUS CULTURE WITH STAIN

## 2018-11-19 LAB — FUNGAL ORGANISM REFLEX

## 2018-11-19 NOTE — Patient Instructions (Signed)
Medication Instructions:  Your physician recommends that you continue on your current medications as directed. Please refer to the Current Medication list given to you today.  If you need a refill on your cardiac medications before your next appointment, please call your pharmacy.   Lab work: None If you have labs (blood work) drawn today and your tests are completely normal, you will receive your results only by: Marland Kitchen MyChart Message (if you have MyChart) OR . A paper copy in the mail If you have any lab test that is abnormal or we need to change your treatment, we will call you to review the results.  Testing/Procedures: None  Follow-Up: Your physician recommends that you schedule a follow-up appointment in: Your physician recommends that you schedule a follow-up appointment in: 2 weeks with Dr. Radford Pax, we will call you to schedule.

## 2018-11-20 ENCOUNTER — Encounter (HOSPITAL_COMMUNITY): Payer: Self-pay

## 2018-11-21 ENCOUNTER — Telehealth (HOSPITAL_COMMUNITY): Payer: Self-pay | Admitting: *Deleted

## 2018-11-21 NOTE — Telephone Encounter (Signed)
Called to notify patient that the cardiac and pulmonary rehabilitation department will be closed temporarily due to COVID-19 restrictions. Pt verbalized understanding.   Sol Passer, MS, ACSM CEP 11/21/2018 1420

## 2018-11-22 ENCOUNTER — Encounter (HOSPITAL_COMMUNITY): Payer: Self-pay

## 2018-11-22 ENCOUNTER — Ambulatory Visit
Admission: RE | Admit: 2018-11-22 | Discharge: 2018-11-22 | Disposition: A | Discharge status: Home / Self Care | Payer: Medicare Other | Source: Ambulatory Visit | Attending: Radiation Oncology | Admitting: Radiation Oncology

## 2018-11-22 ENCOUNTER — Other Ambulatory Visit: Payer: Self-pay | Admitting: *Deleted

## 2018-11-22 ENCOUNTER — Encounter: Payer: Self-pay | Admitting: *Deleted

## 2018-11-22 ENCOUNTER — Telehealth: Payer: Self-pay | Admitting: *Deleted

## 2018-11-22 ENCOUNTER — Other Ambulatory Visit: Payer: Self-pay

## 2018-11-22 DIAGNOSIS — C383 Malignant neoplasm of mediastinum, part unspecified: Secondary | ICD-10-CM

## 2018-11-22 DIAGNOSIS — C3432 Malignant neoplasm of lower lobe, left bronchus or lung: Secondary | ICD-10-CM | POA: Diagnosis not present

## 2018-11-22 DIAGNOSIS — Z87891 Personal history of nicotine dependence: Secondary | ICD-10-CM | POA: Diagnosis not present

## 2018-11-22 DIAGNOSIS — Z8589 Personal history of malignant neoplasm of other organs and systems: Secondary | ICD-10-CM | POA: Diagnosis not present

## 2018-11-22 DIAGNOSIS — Z923 Personal history of irradiation: Secondary | ICD-10-CM | POA: Diagnosis not present

## 2018-11-22 NOTE — Telephone Encounter (Signed)
-----   Message from [name], MD sent at 11/19/2018  8:14 PM EDT ----- Good AHI on PAP but needs to improve compliance
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
## 2018-11-22 NOTE — Progress Notes (Signed)
Oncology Nurse Navigator Documentation  Oncology Nurse Navigator Flowsheets 11/22/2018  Navigator Location [facility]  Referral date to RadOnc/MedOnc -  Navigator Encounter Type Clinic/MDC/I spoke with patient today at thoracic clinic.  I gave an explained information on diagnosis and treatment plan.  He received the lung cancer education booklet, Dr. Lois Huxley contact information, his SIM appt, navigator information.    Telephone -  Abnormal Finding Date 10/03/2018  Confirmed Diagnosis Date 11/14/2018  Multidisiplinary Clinic Date 11/22/2018  Treatment Initiated Date 11/26/2018  Patient Visit Type MedOnc  Treatment Phase Pre-Tx/Tx Discussion  Barriers/Navigation Needs Education  Education Newly Diagnosed Cancer Education;Other  Interventions Education  Coordination of Care -  Education Method Verbal;Written  Support Groups/Services Other  Acuity Level 2  Acuity Level 2 Educational needs  Time Spent with Patient 30

## 2018-11-22 NOTE — Telephone Encounter (Signed)
Informed patient of compliance results and verbalized understanding was indicated. Patient is aware and agreeable to AHI being within range at 5.1. Patient is aware and agreeable to improving compliance with machine usage.

## 2018-11-22 NOTE — Progress Notes (Signed)
The proposed treatment discussed in cancer conference 11/22/2018 is for discussion purpose only and is not a binding recommendation.  The patient was not physically examined nor present for their treatment options.  Therefore, final treatment plans cannot be decided.

## 2018-11-23 ENCOUNTER — Telehealth: Payer: Self-pay | Admitting: Pharmacist

## 2018-11-23 ENCOUNTER — Encounter (HOSPITAL_COMMUNITY): Payer: Self-pay

## 2018-11-23 NOTE — Telephone Encounter (Signed)
Spoke with pt. No acute needs at this time. Will reschedule once able with BP clinic. Pt aware to call with issues.

## 2018-11-26 ENCOUNTER — Other Ambulatory Visit: Payer: Self-pay

## 2018-11-26 ENCOUNTER — Ambulatory Visit
Admission: RE | Admit: 2018-11-26 | Discharge: 2018-11-26 | Disposition: A | Discharge status: Home / Self Care | Payer: Medicare Other | Source: Ambulatory Visit | Attending: Radiation Oncology | Admitting: Radiation Oncology

## 2018-11-26 DIAGNOSIS — C3432 Malignant neoplasm of lower lobe, left bronchus or lung: Secondary | ICD-10-CM | POA: Insufficient documentation

## 2018-11-26 DIAGNOSIS — R911 Solitary pulmonary nodule: Secondary | ICD-10-CM

## 2018-11-26 DIAGNOSIS — Z510 Encounter for antineoplastic radiation therapy: Secondary | ICD-10-CM | POA: Diagnosis not present

## 2018-11-27 ENCOUNTER — Encounter (HOSPITAL_COMMUNITY): Payer: Self-pay

## 2018-11-28 ENCOUNTER — Ambulatory Visit: Payer: Medicare Other

## 2018-11-29 ENCOUNTER — Encounter (HOSPITAL_COMMUNITY): Payer: Self-pay

## 2018-11-29 DIAGNOSIS — C3432 Malignant neoplasm of lower lobe, left bronchus or lung: Secondary | ICD-10-CM | POA: Diagnosis not present

## 2018-11-29 DIAGNOSIS — Z510 Encounter for antineoplastic radiation therapy: Secondary | ICD-10-CM | POA: Diagnosis not present

## 2018-11-30 ENCOUNTER — Encounter (HOSPITAL_COMMUNITY): Payer: Self-pay

## 2018-12-02 LAB — ACID FAST CULTURE WITH REFLEXED SENSITIVITIES: ACID FAST CULTURE - AFSCU3: NEGATIVE

## 2018-12-03 ENCOUNTER — Ambulatory Visit
Admission: RE | Admit: 2018-12-03 | Discharge: 2018-12-03 | Disposition: A | Discharge status: Home / Self Care | Payer: Medicare Other | Source: Ambulatory Visit | Attending: Radiation Oncology | Admitting: Radiation Oncology

## 2018-12-03 ENCOUNTER — Other Ambulatory Visit: Payer: Self-pay

## 2018-12-03 DIAGNOSIS — C3432 Malignant neoplasm of lower lobe, left bronchus or lung: Secondary | ICD-10-CM

## 2018-12-03 DIAGNOSIS — Z510 Encounter for antineoplastic radiation therapy: Secondary | ICD-10-CM | POA: Diagnosis not present

## 2018-12-03 NOTE — Progress Notes (Signed)
  Radiation Oncology         [phone] ________________________________  Name: [name] MRN: [id number]  Date: 12/03/2018  DOB: 1951-07-09  Stereotactic Body Radiotherapy Treatment Procedure Note  NARRATIVE:  [name] was brought to the stereotactic radiation treatment machine and placed supine on the CT couch. The patient was set up for stereotactic body radiotherapy on the body fix pillow.  3D TREATMENT PLANNING AND DOSIMETRY:  The patient's radiation plan was reviewed and approved prior to starting treatment.  It showed 3-dimensional radiation distributions overlaid onto the planning CT.  The [facility] for the target structures as well as the organs at risk were reviewed. The documentation of this is filed in the radiation oncology EMR.  SIMULATION VERIFICATION:  The patient underwent CT imaging on the treatment unit.  These were carefully aligned to document that the ablative radiation dose would cover the target volume and maximally spare the nearby organs at risk according to the planned distribution.  SPECIAL TREATMENT PROCEDURE: [name] received high dose ablative stereotactic body radiotherapy to the planned target volume without unforeseen complications. Treatment was delivered uneventfully. The high doses associated with stereotactic body radiotherapy and the significant potential risks require careful treatment set up and patient monitoring constituting a special treatment procedure   STEREOTACTIC TREATMENT MANAGEMENT:  Following delivery, the patient was evaluated clinically. The patient tolerated treatment without significant acute effects, and was discharged to home in stable condition.    PLAN: Continue treatment as planned.  ________________________________  Blair Promise, PhD, MD   This document serves as a record of services personally performed by Gery Pray, MD. It was created on his behalf by Wilburn Mylar, a trained medical scribe. The creation  of this record is based on the scribe's personal observations and the provider's statements to them. This document has been checked and approved by the attending provider.

## 2018-12-04 ENCOUNTER — Ambulatory Visit: Payer: Medicare Other | Admitting: Radiation Oncology

## 2018-12-04 ENCOUNTER — Encounter (HOSPITAL_COMMUNITY): Payer: Self-pay

## 2018-12-05 ENCOUNTER — Ambulatory Visit
Admission: RE | Admit: 2018-12-05 | Discharge: 2018-12-05 | Disposition: A | Discharge status: Home / Self Care | Payer: Medicare Other | Source: Ambulatory Visit | Attending: Radiation Oncology | Admitting: Radiation Oncology

## 2018-12-05 ENCOUNTER — Other Ambulatory Visit: Payer: Self-pay

## 2018-12-05 ENCOUNTER — Telehealth: Payer: Self-pay | Admitting: Cardiology

## 2018-12-05 DIAGNOSIS — C3432 Malignant neoplasm of lower lobe, left bronchus or lung: Secondary | ICD-10-CM | POA: Diagnosis not present

## 2018-12-05 DIAGNOSIS — Z510 Encounter for antineoplastic radiation therapy: Secondary | ICD-10-CM | POA: Diagnosis not present

## 2018-12-05 NOTE — Telephone Encounter (Signed)
FMLA forms placed in box for Dr. Radford Pax to sign.

## 2018-12-05 NOTE — Progress Notes (Signed)
  Radiation Oncology         [phone] ________________________________  Name: [name] MRN: [id number]  Date: 12/05/2018  DOB: [birth year]  Stereotactic Body Radiotherapy Treatment Procedure Note  NARRATIVE:  [name] was brought to the stereotactic radiation treatment machine and placed supine on the CT couch. The patient was set up for stereotactic body radiotherapy on the body fix pillow.  3D TREATMENT PLANNING AND DOSIMETRY:  The patient's radiation plan was reviewed and approved prior to starting treatment.  It showed 3-dimensional radiation distributions overlaid onto the planning CT.  The [facility] for the target structures as well as the organs at risk were reviewed. The documentation of this is filed in the radiation oncology EMR.  SIMULATION VERIFICATION:  The patient underwent CT imaging on the treatment unit.  These were carefully aligned to document that the ablative radiation dose would cover the target volume and maximally spare the nearby organs at risk according to the planned distribution.  SPECIAL TREATMENT PROCEDURE: [name] received high dose ablative stereotactic body radiotherapy to the planned target volume without unforeseen complications. Treatment was delivered uneventfully. The high doses associated with stereotactic body radiotherapy and the significant potential risks require careful treatment set up and patient monitoring constituting a special treatment procedure   STEREOTACTIC TREATMENT MANAGEMENT:  Following delivery, the patient was evaluated clinically. The patient tolerated treatment without significant acute effects, and was discharged to home in stable condition.    PLAN: Continue treatment as planned.  ________________________________  Blair Promise, PhD, MD

## 2018-12-06 ENCOUNTER — Encounter (HOSPITAL_COMMUNITY): Payer: Self-pay

## 2018-12-07 ENCOUNTER — Encounter (HOSPITAL_COMMUNITY): Payer: Self-pay

## 2018-12-07 ENCOUNTER — Other Ambulatory Visit: Payer: Self-pay

## 2018-12-07 ENCOUNTER — Ambulatory Visit
Admission: RE | Admit: 2018-12-07 | Discharge: 2018-12-07 | Disposition: A | Discharge status: Home / Self Care | Payer: Medicare Other | Source: Ambulatory Visit | Attending: Radiation Oncology | Admitting: Radiation Oncology

## 2018-12-07 DIAGNOSIS — C3432 Malignant neoplasm of lower lobe, left bronchus or lung: Secondary | ICD-10-CM | POA: Diagnosis not present

## 2018-12-07 DIAGNOSIS — Z510 Encounter for antineoplastic radiation therapy: Secondary | ICD-10-CM | POA: Diagnosis not present

## 2018-12-10 ENCOUNTER — Ambulatory Visit: Payer: Medicare Other | Admitting: Radiation Oncology

## 2018-12-11 ENCOUNTER — Encounter (HOSPITAL_COMMUNITY): Payer: Self-pay

## 2018-12-11 ENCOUNTER — Other Ambulatory Visit: Payer: Self-pay

## 2018-12-11 ENCOUNTER — Ambulatory Visit
Admission: RE | Admit: 2018-12-11 | Discharge: 2018-12-11 | Disposition: A | Discharge status: Home / Self Care | Payer: Medicare Other | Source: Ambulatory Visit | Attending: Radiation Oncology | Admitting: Radiation Oncology

## 2018-12-11 ENCOUNTER — Telehealth: Payer: Self-pay | Admitting: Cardiology

## 2018-12-11 DIAGNOSIS — R911 Solitary pulmonary nodule: Secondary | ICD-10-CM

## 2018-12-11 DIAGNOSIS — C3432 Malignant neoplasm of lower lobe, left bronchus or lung: Secondary | ICD-10-CM | POA: Diagnosis not present

## 2018-12-11 DIAGNOSIS — Z510 Encounter for antineoplastic radiation therapy: Secondary | ICD-10-CM | POA: Diagnosis not present

## 2018-12-11 NOTE — Telephone Encounter (Signed)
Signed FMLA form returned to [place] at [facility].

## 2018-12-11 NOTE — Progress Notes (Signed)
  Radiation Oncology         [phone] ________________________________  Name: ILIR MAHRT MRN: [id number]  Date: 12/11/2018  DOB: May 21, 1951  Stereotactic Body Radiotherapy Treatment Procedure Note  NARRATIVE:  DIXON LUCZAK was brought to the stereotactic radiation treatment machine and placed supine on the CT couch. The patient was set up for stereotactic body radiotherapy on the body fix pillow.  3D TREATMENT PLANNING AND DOSIMETRY:  The patient's radiation plan was reviewed and approved prior to starting treatment.  It showed 3-dimensional radiation distributions overlaid onto the planning CT.  The [facility] for the target structures as well as the organs at risk were reviewed. The documentation of this is filed in the radiation oncology EMR.  SIMULATION VERIFICATION:  The patient underwent CT imaging on the treatment unit.  These were carefully aligned to document that the ablative radiation dose would cover the target volume and maximally spare the nearby organs at risk according to the planned distribution.  SPECIAL TREATMENT PROCEDURE: Balinda Quails Smolenski received high dose ablative stereotactic body radiotherapy to the planned target volume without unforeseen complications. Treatment was delivered uneventfully. The high doses associated with stereotactic body radiotherapy and the significant potential risks require careful treatment set up and patient monitoring constituting a special treatment procedure   STEREOTACTIC TREATMENT MANAGEMENT:  Following delivery, the patient was evaluated clinically. The patient tolerated treatment without significant acute effects, and was discharged to home in stable condition.    PLAN: Continue treatment as planned.  ________________________________  Blair Promise, PhD, MD

## 2018-12-11 NOTE — Progress Notes (Signed)
  Radiation Oncology         [phone] ________________________________  Name: [name] MRN: [id number]  Date: 11/26/2018  DOB: 03/23/1951   STEREOTACTIC BODY RADIOTHERAPY SIMULATION AND TREATMENT PLANNING NOTE    DIAGNOSIS:  Stage IB (T2a, N0, M0) squamous cell carcinoma of the left lower lung  NARRATIVE:  The patient was brought to the [facility].  Identity was confirmed.  All relevant records and images related to the planned course of therapy were reviewed.  The patient freely provided informed written consent to proceed with treatment after reviewing the details related to the planned course of therapy. The consent form was witnessed and verified by the simulation staff.  Then, the patient was set-up in a stable reproducible  supine position for radiation therapy.  A BodyFix immobilization pillow was fabricated for reproducible positioning.  Then I personally applied the abdominal compression paddle to limit respiratory excursion.  4D respiratoy motion management CT images were obtained.  Surface markings were placed.  The CT images were loaded into the planning software.  Then, using Cine, MIP, and standard views, the internal target volume (ITV) and planning target volumes (PTV) were delinieated, and avoidance structures were contoured.  Treatment planning then occurred.  The radiation prescription was entered and confirmed.  A total of two complex treatment devices were fabricated in the form of the BodyFix immobilization pillow and a neck accuform cushion.  I have requested : 3D Simulation  I have requested a DVH of the following structures: Heart, Lungs, Esophagus, Chest Wall, Brachial Plexus, Major Blood Vessels, and targets.  PLAN:  The patient will receive 54 Gy in 3 fractions. If chest wall constraints will allow.  -----------------------------------  Blair Promise, PhD, MD

## 2018-12-13 ENCOUNTER — Other Ambulatory Visit: Payer: Self-pay

## 2018-12-13 ENCOUNTER — Encounter (HOSPITAL_COMMUNITY): Payer: Self-pay

## 2018-12-13 ENCOUNTER — Ambulatory Visit
Admission: RE | Admit: 2018-12-13 | Discharge: 2018-12-13 | Disposition: A | Discharge status: Home / Self Care | Payer: Medicare Other | Source: Ambulatory Visit | Attending: Radiation Oncology | Admitting: Radiation Oncology

## 2018-12-13 ENCOUNTER — Encounter: Payer: Self-pay | Admitting: Radiation Oncology

## 2018-12-13 DIAGNOSIS — C3432 Malignant neoplasm of lower lobe, left bronchus or lung: Secondary | ICD-10-CM | POA: Diagnosis not present

## 2018-12-13 DIAGNOSIS — Z510 Encounter for antineoplastic radiation therapy: Secondary | ICD-10-CM | POA: Diagnosis not present

## 2018-12-13 NOTE — Progress Notes (Signed)
  Radiation Oncology         [phone] ________________________________  Name: [name] MRN: [id number]  Date: 12/13/2018  DOB: [birth year]  Stereotactic Body Radiotherapy Treatment Procedure Note  NARRATIVE:  [name] was brought to the stereotactic radiation treatment machine and placed supine on the CT couch. The patient was set up for stereotactic body radiotherapy on the body fix pillow.  3D TREATMENT PLANNING AND DOSIMETRY:  The patient's radiation plan was reviewed and approved prior to starting treatment.  It showed 3-dimensional radiation distributions overlaid onto the planning CT.  The [facility] for the target structures as well as the organs at risk were reviewed. The documentation of this is filed in the radiation oncology EMR.  SIMULATION VERIFICATION:  The patient underwent CT imaging on the treatment unit.  These were carefully aligned to document that the ablative radiation dose would cover the target volume and maximally spare the nearby organs at risk according to the planned distribution.  SPECIAL TREATMENT PROCEDURE: [name] received high dose ablative stereotactic body radiotherapy to the planned target volume without unforeseen complications. Treatment was delivered uneventfully. The high doses associated with stereotactic body radiotherapy and the significant potential risks require careful treatment set up and patient monitoring constituting a special treatment procedure   STEREOTACTIC TREATMENT MANAGEMENT:  Following delivery, the patient was evaluated clinically. The patient tolerated treatment without significant acute effects, and was discharged to home in stable condition.    PLAN: The patient has completed SBRT. He will return to radiation oncology clinic for routine followup in one month.  ________________________________  Blair Promise, PhD, MD  This document serves as a record of services personally performed by Gery Pray, MD. It was  created on his behalf by Rae Lips, a trained medical scribe. The creation of this record is based on the scribe's personal observations and the provider's statements to them. This document has been checked and approved by the attending provider.

## 2018-12-13 NOTE — Progress Notes (Signed)
  Radiation Oncology         [phone] ________________________________  Name: [name] MRN: [id number]  Date: 12/13/2018  DOB: 07/18/1951  End of Treatment Note  Diagnosis:   68 y.o. male with Stage IB(T2a, N0, M0) squamous cell carcinoma of the left lower lung  Indication for treatment:  Curative       Radiation treatment dates:   12/03/2018, 12/05/2018, 12/07/2018, 12/11/2018, 12/13/2018  Site/dose:   The tumor in the LLL lung was treated with a course of stereotactic body radiation treatment. The patient received 50 Gy in 5 fractions at 10 Gy per fraction.  Beams/energy:   SBRT/SRT-VMAT // 6X-FFF Photon   Narrative: The patient tolerated radiation treatment relatively well.   The patient did not have any signs of acute toxicity during treatment. He did note some increased fatigue.  Plan: The patient has completed radiation treatment. The patient will return to radiation oncology clinic for routine followup in one month. I advised the patient to call or return sooner if they have any questions or concerns related to their recovery or treatment.   ------------------------------------------------  Blair Promise, PhD, MD  This document serves as a record of services personally performed by Gery Pray, MD. It was created on his behalf by Rae Lips, a trained medical scribe. The creation of this record is based on the scribe's personal observations and the provider's statements to them. This document has been checked and approved by the attending provider.

## 2018-12-20 DIAGNOSIS — I10 Essential (primary) hypertension: Secondary | ICD-10-CM | POA: Diagnosis not present

## 2018-12-20 DIAGNOSIS — E7800 Pure hypercholesterolemia, unspecified: Secondary | ICD-10-CM | POA: Diagnosis not present

## 2018-12-20 DIAGNOSIS — E039 Hypothyroidism, unspecified: Secondary | ICD-10-CM | POA: Diagnosis not present

## 2018-12-20 DIAGNOSIS — E11319 Type 2 diabetes mellitus with unspecified diabetic retinopathy without macular edema: Secondary | ICD-10-CM | POA: Diagnosis not present

## 2018-12-20 DIAGNOSIS — N189 Chronic kidney disease, unspecified: Secondary | ICD-10-CM | POA: Diagnosis not present

## 2018-12-20 DIAGNOSIS — N529 Male erectile dysfunction, unspecified: Secondary | ICD-10-CM | POA: Diagnosis not present

## 2018-12-20 DIAGNOSIS — E1165 Type 2 diabetes mellitus with hyperglycemia: Secondary | ICD-10-CM | POA: Diagnosis not present

## 2018-12-20 DIAGNOSIS — Z9641 Presence of insulin pump (external) (internal): Secondary | ICD-10-CM | POA: Diagnosis not present

## 2019-01-08 ENCOUNTER — Telehealth: Payer: Self-pay

## 2019-01-08 NOTE — Telephone Encounter (Signed)
Called to change upcoming appointment with Dr Radford Pax into a virtual visit but got no answer and no answering machine came on  to leave message when I called.

## 2019-01-10 ENCOUNTER — Other Ambulatory Visit: Payer: Self-pay

## 2019-01-10 ENCOUNTER — Ambulatory Visit: Payer: Medicare Other | Admitting: Cardiology

## 2019-01-10 ENCOUNTER — Encounter: Payer: Self-pay | Admitting: Cardiology

## 2019-01-10 ENCOUNTER — Telehealth (INDEPENDENT_AMBULATORY_CARE_PROVIDER_SITE_OTHER): Payer: Medicare Other | Admitting: Cardiology

## 2019-01-10 DIAGNOSIS — I5022 Chronic systolic (congestive) heart failure: Secondary | ICD-10-CM

## 2019-01-10 DIAGNOSIS — E785 Hyperlipidemia, unspecified: Secondary | ICD-10-CM

## 2019-01-10 DIAGNOSIS — Z952 Presence of prosthetic heart valve: Secondary | ICD-10-CM

## 2019-01-10 DIAGNOSIS — I350 Nonrheumatic aortic (valve) stenosis: Secondary | ICD-10-CM

## 2019-01-10 DIAGNOSIS — C3432 Malignant neoplasm of lower lobe, left bronchus or lung: Secondary | ICD-10-CM | POA: Diagnosis not present

## 2019-01-10 DIAGNOSIS — I2510 Atherosclerotic heart disease of native coronary artery without angina pectoris: Secondary | ICD-10-CM | POA: Diagnosis not present

## 2019-01-10 NOTE — Progress Notes (Signed)
Virtual Visit via Video Note   This visit type was conducted due to national recommendations for restrictions regarding the COVID-19 Pandemic (e.g. social distancing) in an effort to limit this patient's exposure and mitigate transmission in our community.  Due to his co-morbid illnesses, this patient is at least at moderate risk for complications without adequate follow up.  This format is felt to be most appropriate for this patient at this time.  All issues noted in this document were discussed and addressed.  A limited physical exam was performed with this format.  Please refer to the patient's chart for his consent to telehealth for [facility]  Evaluation Performed:  Follow-up visit  This visit type was conducted due to national recommendations for restrictions regarding the COVID-19 Pandemic (e.g. social distancing).  This format is felt to be most appropriate for this patient at this time.  All issues noted in this document were discussed and addressed.  No physical exam was performed (except for noted visual exam findings with Video Visits).  Please refer to the patient's chart (MyChart message for video visits and phone note for telephone visits) for the patient's consent to telehealth for [facility] ([place]).  Date:  01/10/2019   ID:  [name], DOB 29-Oct-1950, MRN [id number]  Patient Location:  Home  Provider location:   [place]  PCP:  [name], MD  Cardiologist:  Fransico Him, MD  Electrophysiologist:  None   Chief Complaint:  CHF  History of Present Illness:    [name] is a 68 y.o. male who presents via audio/video conferencing for a telehealth visit today.    [name] Cottenis a 68 y.o.malewith a hx of CAD, aortic stenosis and systolic HF. Hehad anon-STEMI in 2013 treated with PCI of the LCx/OM. LHC in 5/14 demonstrated severe 2 vessel CAD with critical in-stent restenosis in the LCx. He underwent CABG (SVG-PDA, SVG-LCx). He also has a history of  aortic stenosis. Echo 10/2016 showed moderately reduced LVF at 35-40% with diffuse HK, mild AS and moderate AR. Right and Left heart cath showed mild to moderate AS with AVA 0.837cm2 and EF 45% with patent SVG to RPDA and SVG to OM1 With occluded native vessels. He ultimately was diagnosed with low gradient aortic stenosis with AI and underwent TAVR with a 29 mm EdwardsSapein3 THV via transfemoral approach after being admitted with acute exacerbation of CHF on 05/15/2018.  He has had someproblems with SOB and a BNP was elevated at 1501. His Lasix was increased to 40mg  BID for 2 days and then down to 20mg  daily from his usual daily does of 20mg  daily.On televisit a few days ago he haddropped 11lbs and his BP hadbeen running as low as 80/35mmHg and he wasfeeling bad with severe fatigue and nausea and vomiting after his BP dropped. Hehadrecently started on Entresto 24-26mg  BID. He has not had any further LE edema or SOB since dropping 11lbs. His baseline weight is 162lbs and he is down to 151lbs.I instructed him to stop his Lasix and Entresto and decrease Toprol to 50mg  daily.   He was seen back for Tele visit on 4/3 and was doing better.  His BP had improved some and weight was up 2lbs from prior visit.  His dry weight is around 165lbs and at this weight he feels good.    He is here today for tele-visit and is doing well.  He is finished his radiation therapy for lung cancer.  He says he is actually feeling  good although his wife says his appetite is down and he is not drinking as much fluid as she feels he should be.  He has not had any really low blood pressure readings they have all been around 354 systolic.  He has not had any dizzy spells.  He denies any chest pain, shortness of breath, PND, orthopnea, lower extremity edema.  His weight is maintaining around 154 pounds.  His wife is now started Ensure supplements to try to get his nutrition improved.  His heart rates been around 100  to 105 bpm and his wife thinks he is dehydrated.  He has not used any Lasix recently.  The patient does not have symptoms concerning for COVID-19 infection (fever, chills, cough, or new shortness of breath).    Prior CV studies:   The following studies were reviewed today:  2D echo  Past Medical History:  Diagnosis Date   Acute on chronic combined systolic and diastolic CHF (congestive heart failure) ([place]) 04/16/2018   Anemia    Arthritis    Chronic kidney disease    Chronic systolic CHF (congestive heart failure) ([place]) 07/04/2018   Coronary artery disease 02/2012   a. s/p stenting in 2013  b. s/p CABGx2V (SVG--> PDA, SVG--> LCx).   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  Intolerant to PAP therapy   S/P TAVR (transcatheter aortic valve replacement) 05/15/2018   29 mm Edwards Sapien 3 transcatheter heart valve placed via percutaneous right transfemoral approach    Severe aortic stenosis    Past Surgical History:  Procedure Laterality Date   COLONOSCOPY  07/25/2012   Procedure: COLONOSCOPY;  Surgeon: Winfield Cunas., MD;  Location[facility] ENDOSCOPY;  Service: Endoscopy;  Laterality: N/A;   COLONOSCOPY N/A 12/02/2013   Procedure: COLONOSCOPY;  Surgeon: Winfield Cunas., MD;  Location: [facility] ENDOSCOPY;  Service: Endoscopy;  Laterality: N/A;   CORONARY ARTERY BYPASS GRAFT N/A 01/04/2013   Procedure: CORONARY ARTERY BYPASS GRAFTING (CABG) times two using right saphenous vein harvested with endoscope.;  Surgeon: Ivin Poot, MD;  Location: [place];  Service: Open Heart Surgery;  Laterality: N/A;   ESOPHAGOGASTRODUODENOSCOPY  07/20/2012   Procedure: ESOPHAGOGASTRODUODENOSCOPY (EGD);  Surgeon: Winfield Cunas., MD;  [facility] END[facility]COPY;  Service: Endoscopy;  Laterality: N/A;   ESOPHAGOGASTRODUODENOSCOPY N/A 12/02/2013   Procedure: ESOPHAGOGASTRODUODENOSCOPY (EGD);  Surgeon: Winfield Cunas., MD;  Location: [facility] ENDOSCOPY;  Service: Endoscopy;  Laterality: N/A;   HOT HEMOSTASIS N/A 12/02/2013   Procedure: HOT HEMOSTASIS (ARGON PLASMA COAGULATION/BICAP);  Surgeon: Winfield Cunas., MD;  Location: [facility] ENDOSCOPY;  Service: Endoscopy;  Laterality: N/A;   INTRAOPERATIVE TRANSESOPHAGEAL ECHOCARDIOGRAM N/A 01/04/2013   Procedure: INTRAOPERATIVE TRANSESOPHAGEAL ECHOCARDIOGRAM;  Surgeon: Ivin Poot, MD;  Location: [place];  Service: Open Heart Surgery;  Laterality: N/A;   INTRAOPERATIVE TRANSTHORACIC ECHOCARDIOGRAM N/A 05/15/2018   Procedure: INTRAOPERATIVE TRANSTHORACIC ECHOCARDIOGRAM;  Surgeon: Burnell Blanks, MD;  Location: [place];  Service: Open Heart Surgery;  Laterality: N/A;  IR THORACENTESIS ASP PLEURAL SPACE W/IMG GUIDE  10/19/2018   LEFT HEART CATHETERIZATION WITH CORONARY ANGIOGRAM N/A 03/06/2012   Procedure: LEFT HEART CATHETERIZATION WITH CORONARY ANGIOGRAM;  Surgeon: [name], MD;  Location: [place] CATH LAB;  Service: Cardiovascular;  Laterality: N/A;   LITHOTRIPSY     [place] Left 01/04/2013   Procedure: RADIAL ARTERY HARVEST;  Surgeon: Ivin Poot, MD;  Location: [place];  Service: Vascular;  Laterality: Left;  Artery not havested. Unsuitable for use.   resection mediastinal seminonma     RIGHT/LEFT HEART CATH AND CORONARY/GRAFT ANGIOGRAPHY N/A 11/15/2016   Procedure: Right/Left Heart Cath and Coronary/Graft Angiography;  Surgeon: Leonie Man, MD;  Location: [place] CV LAB;  Service: Cardiovascular;  Laterality: N/A;   RIGHT/LEFT HEART  CATH AND CORONARY/GRAFT ANGIOGRAPHY N/A 04/13/2018   Procedure: RIGHT/LEFT HEART CATH AND CORONARY/GRAFT ANGIOGRAPHY;  Surgeon: Jolaine Artist, MD;  Location: [place] CV LAB;  Service: Cardiovascular;  Laterality: N/A;   TRANSCATHETER AORTIC VALVE REPLACEMENT, TRANSFEMORAL  05/15/2018   TRANSCATHETER AORTIC VALVE REPLACEMENT, TRANSFEMORAL N/A 05/15/2018   Procedure: TRANSCATHETER AORTIC VALVE REPLACEMENT, TRANSFEMORAL;  Surgeon: Burnell Blanks, MD;  Location: [place];  Service: Open Heart Surgery;  Laterality: N/A;     Current Meds  Medication Sig   amitriptyline (ELAVIL) 25 MG tablet Take 25 mg by mouth at bedtime.    amoxicillin (AMOXIL) 500 MG capsule Take 1 capsule (500 mg total) by mouth as directed. Take 4 tablets (2000 mg ) 1 hour prior to dental procedures.   aspirin 81 MG chewable tablet Chew 1 tablet (81 mg total) by mouth daily.   B Complex-C (SUPER B COMPLEX PO) Take 1 tablet by mouth daily.   B-D ULTRAFINE III SHORT PEN 31G X 8 MM MISC 1 each by Other route daily as needed (GLUCOSE TESTING (INSULINE NEEDLE)).    Cholecalciferol (VITAMIN D-3) 5000 units TABS Take 5,000 Units by mouth daily.   furosemide (LASIX) 20 MG tablet Take 1 tablet (20 mg total) by mouth daily. (Patient taking differently: Take 20 mg by mouth daily. On hold per Dr. Radford Pax 11/15/18)   hydroxypropyl methylcellulose / hypromellose (ISOPTO TEARS / GONIOVISC) 2.5 % ophthalmic solution Place 1 drop into both eyes 4 (four) times daily as needed for dry eyes.   insulin detemir (LEVEMIR) 100 UNIT/ML injection Inject 40 Units into the skin every morning.   levothyroxine (SYNTHROID, LEVOTHROID) 50 MCG tablet Take 50 mcg by mouth daily before breakfast.    metFORMIN (GLUCOPHAGE) 500 MG tablet Take 2 tablets (1,000 mg total) by mouth 2 (two) times daily with a meal.   metoprolol succinate (TOPROL-XL) 50 MG 24 hr tablet Take 1 tablet (50 mg total) by mouth daily. Take with or immediately following a  meal. Do not take if systolic blood pressure is less than 110.   Multiple Vitamin (MULTIVITAMIN) tablet Take 1 tablet by mouth daily.   ONE TOUCH ULTRA TEST test strip 1 each by Other route as needed for other.    simvastatin (ZOCOR) 20 MG tablet Take 20 mg by mouth every evening.    zolpidem (AMBIEN) 10 MG tablet Take 10 mg by mouth at bedtime.      Allergies:   Lipitor [atorvastatin] and Adhesive [tape]   Social History   Tobacco Use   Smoking status: Former Smoker    Last attempt to quit: 03/06/1985    Years since quitting: 33.8   Smokeless tobacco: Never Used  Substance Use Topics   Alcohol use: No   Drug use: No     Family Hx: The patient's family history includes Cancer in his brother; Diabetes in his father, mother, and sister; Heart failure in his mother; Hypertension in his mother.  ROS:   Please see the history of present illness.     All other systems reviewed and are negative.   Labs/Other Tests and Data Reviewed:    Recent Labs: 05/11/2018: B Natriuretic Peptide 740.6 05/16/2018: Magnesium 1.9; TSH 3.157 10/25/2018: ALT 33 11/14/2018: BUN 26; Creatinine, Ser 1.25; Hemoglobin 14.7; NT-Pro BNP 935; Platelets 182; Potassium 4.6; Sodium 141   Recent Lipid Panel Lab Results  Component Value Date/Time   CHOL 95 04/12/2018 02:16 AM   CHOL 104 03/28/2018 08:50 AM   TRIG 36 04/12/2018 02:16 AM   HDL 33 (L) 04/12/2018 02:16 AM   HDL 35 (L) 03/28/2018 08:50 AM   CHOLHDL 2.9 04/12/2018 02:16 AM   LDLCALC 55 04/12/2018 02:16 AM   LDLCALC 58 03/28/2018 08:50 AM    Wt Readings from Last 3 Encounters:  01/10/19 154 lb (69.9 kg)  11/22/18 165 lb 12.8 oz (75.2 kg)  11/19/18 157 lb (71.2 kg)     Objective:    Vital Signs:  BP 108/70 Comment: Taken yesterday   Pulse (!) 108 Comment: Taken yesterday   Ht 5\' 11"  (1.803 m)    Wt 154 lb (69.9 kg)    BMI 21.48 kg/m    CONSTITUTIONAL:  Well nourished, well developed male in no acute distress.  EYES:  anicteric MOUTH: oral mucosa is pink RESPIRATORY: Normal respiratory effort, symmetric expansion CARDIOVASCULAR: No peripheral edema SKIN: No rash, lesions or ulcers MUSCULOSKELETAL: no digital cyanosis NEURO: Cranial Nerves II-XII grossly intact, moves all extremities PSYCH: Intact judgement and insight.  A&O x 3, Mood/affect appropriate   ASSESSMENT & PLAN:    1.  Chronic systolic CHF - His baseline weight is 162lbs.  His echo in 2019 showed moderate to severely reduced LV function with EF 30 to 35% with hypokinesis of the anterior septum and inferior wall.Has been maintaining around 154 pounds.  His wife is concerned that he may be dehydrated.  He has not been using any Lasix except as needed and the last time he used it was more than a week ago.  He has no shortness of breath or lower extremity edema.  His wife is now started Ensure supplements and she is watching the sodium content of them.  His heart rate is slightly elevated at 108 bpm which his wife is concerned may be due to dehydration from him not drinking enough.  I told he was fine to increase p.o. intake.  He will continue to dose Lasix for weights above 162 to 165 pounds.  He will also take Lasix if he develops any swelling in his legs or shortness of breath.  We have been unable to titrate heart failure meds any more than Toprol-XL 50 mg daily.  He did not tolerate Entresto due to significant hypotension.  I am going to repeat a 2D echocardiogram to see if LV function has improved and if not will refer to EP to see if he is a candidate for ICD for primary prevention.I will get an EKG at the time of his echo to make sure he is maintaining sinus rhythm given his mild tachycardia today.  His wife says his pulse is regular.  2.  ASCAD - s/p non-STEMI in 2013 treated with PCI of the  LCx/OM. LHC in 5/14 demonstrated severe 2 vessel CAD with critical in-stent restenosis in the LCx. He underwent CABG (SVG-PDA, SVG-LCx).Repeat left heart  cath showed patent SVG to RPDA, SVG to OM1, 50% ostial D1, 40% proximal to mid LAD, occluded ostial RCA, occluded ostial left circumflex, 40% ostial LAD.  He has not had any anginal symptoms.  He will continue on aspirin, beta-blocker and statin therapy.  3.  Hypertension -BP is still on the soft side.  He has not had any dizzy spells.  He is tolerating Toprol-XL 50 mg daily.  He had been on 75 mg daily but it had to be decreased due to soft blood pressures.  4.  Severe low gradient low outputAS- S/P TAVR 10-1-19with29 mm EdwardsSapein3 THV via transfemoral approach.2D echo 07/04/2018 showed a stable aortic valve TAVR with mild perivalvular AR. Mean aortic valve gradient 6 mmHg. He will continue on ASA 81mg  daily. Plavix has now been stopped as he is 6 months out from TAVR.  5.  Hyperlipidemia -his LDL goal is less than 70.  His LDL was 55 in August.  He will continue on Zocor 40 mg daily.  6.  Left lower lung mass - consistent with invasive squamous cell CA by biopsy.  He is now status post radiation therapy.   COVID-19 Education: The signs and symptoms of COVID-19 were discussed with the patient and how to seek care for testing (follow up with PCP or arrange E-visit).  The importance of social distancing was discussed today.  Patient Risk:   After full review of this patient's clinical status, I feel that they are at least moderate risk at this time.  Time:   Today, I have spent 12 minutes directly with the patient on video discussing medical problems including CAD, hypertension, CHF, lipids.  We also reviewed the symptoms of COVID 19 and the ways to protect against contracting the virus with telehealth technology.  I spent an additional 5 minutes reviewing patient's chart including 2D echo, labs .  Medication Adjustments/Labs and Tests Ordered: Current medicines are reviewed at length with the patient today.  Concerns regarding medicines are outlined above.  Tests Ordered: No  orders of the defined types were placed in this encounter.  Medication Changes: No orders of the defined types were placed in this encounter.   Disposition:  Follow up in 3 month(s)  Signed, Fransico Him, MD  01/10/2019 8:32 AM    [facility]

## 2019-01-10 NOTE — Patient Instructions (Signed)
Medication Instructions:  Your physician recommends that you continue on your current medications as directed. Please refer to the Current Medication list given to you today.  If you need a refill on your cardiac medications before your next appointment, please call your pharmacy.   Lab work: None If you have labs (blood work) drawn today and your tests are completely normal, you will receive your results only by: Marland Kitchen MyChart Message (if you have MyChart) OR . A paper copy in the mail If you have any lab test that is abnormal or we need to change your treatment, we will call you to review the results.  Testing/Procedures: Nurse visit with and EKG, same day as echo  Your physician has requested that you have an echocardiogram. Echocardiography is a painless test that uses sound waves to create images of your heart. It provides your doctor with information about the size and shape of your heart and how well your heart's chambers and valves are working. This procedure takes approximately one hour. There are no restrictions for this procedure.  Follow-Up: 04/09/19 at 9 am with Dr. Radford Pax

## 2019-01-17 ENCOUNTER — Encounter: Payer: Self-pay | Admitting: Radiation Oncology

## 2019-01-17 ENCOUNTER — Other Ambulatory Visit: Payer: Self-pay

## 2019-01-17 ENCOUNTER — Ambulatory Visit
Admission: RE | Admit: 2019-01-17 | Discharge: 2019-01-17 | Disposition: A | Discharge status: Home / Self Care | Payer: Medicare Other | Source: Ambulatory Visit | Attending: Radiation Oncology | Admitting: Radiation Oncology

## 2019-01-17 VITALS — BP 123/67 | HR 96 | Temp 98.6°F | Resp 18 | Ht 71.0 in | Wt 162.4 lb

## 2019-01-17 DIAGNOSIS — Z7982 Long term (current) use of aspirin: Secondary | ICD-10-CM | POA: Insufficient documentation

## 2019-01-17 DIAGNOSIS — Z794 Long term (current) use of insulin: Secondary | ICD-10-CM | POA: Insufficient documentation

## 2019-01-17 DIAGNOSIS — R5383 Other fatigue: Secondary | ICD-10-CM | POA: Insufficient documentation

## 2019-01-17 DIAGNOSIS — Z79899 Other long term (current) drug therapy: Secondary | ICD-10-CM | POA: Insufficient documentation

## 2019-01-17 DIAGNOSIS — Z923 Personal history of irradiation: Secondary | ICD-10-CM | POA: Diagnosis not present

## 2019-01-17 DIAGNOSIS — C3432 Malignant neoplasm of lower lobe, left bronchus or lung: Secondary | ICD-10-CM | POA: Insufficient documentation

## 2019-01-17 NOTE — Progress Notes (Signed)
Radiation Oncology         [phone] ________________________________  Name: [name] MRN: [id number]  Date: 01/17/2019  DOB: Nov 10, 1950  Follow-Up Visit Note  CC: [name], MD  [name], MD    ICD-10-CM   1. Squamous cell carcinoma of bronchus in left lower lobe (HCC) C34.32     Diagnosis:   68 y.o. male with Stage IB(T2a, N0, M0) squamous cell carcinoma of the left lower lung  Interval Since Last Radiation:  5 weeks  12/03/2018 - 12/13/2018: The tumor in the LLL lung was treated with a course of stereotactic body radiation treatment. The patient received 50 Gy in 5 fractions at 10 Gy per fraction.  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 Details of this treatment are not available. The patient reports having several weeks of radiation therapy. He reports his treatments were given at [facility].  Narrative:  The patient returns today for routine follow-up.  He denies any pain. He reports fatigue is improving. He reports occasional non-productive cough. He denies hemoptysis. He denies difficulty swallowing. He denies difficulty breathing.                               ALLERGIES:  is allergic to lipitor [atorvastatin] and adhesive [tape].  Meds: Current Outpatient Medications  Medication Sig Dispense Refill  . amitriptyline (ELAVIL) 25 MG tablet Take 25 mg by mouth at bedtime.     Marland Kitchen amoxicillin (AMOXIL) 500 MG capsule Take 1 capsule (500 mg total) by mouth as directed. Take 4 tablets (2000 mg ) 1 hour prior to dental procedures. 12 capsule 12  . aspirin 81 MG chewable tablet Chew 1 tablet (81 mg total) by mouth daily.    . B Complex-C (SUPER B COMPLEX PO) Take 1 tablet by mouth daily.    . B-D ULTRAFINE III SHORT PEN 31G X 8 MM MISC 1 each by Other route daily as needed (GLUCOSE TESTING (INSULINE NEEDLE)).     . Cholecalciferol (VITAMIN D-3) 5000 units TABS Take 5,000 Units by mouth daily.    . insulin  detemir (LEVEMIR) 100 UNIT/ML injection Inject 40 Units into the skin every morning.    Marland Kitchen levothyroxine (SYNTHROID, LEVOTHROID) 50 MCG tablet Take 50 mcg by mouth daily before breakfast.     . metFORMIN (GLUCOPHAGE) 500 MG tablet Take 2 tablets (1,000 mg total) by mouth 2 (two) times daily with a meal.    . metoprolol succinate (TOPROL-XL) 50 MG 24 hr tablet Take 1 tablet (50 mg total) by mouth daily. Take with or immediately following a meal. Do not take if systolic blood pressure is less than 110. 90 tablet 3  . Multiple Vitamin (MULTIVITAMIN) tablet Take 1 tablet by mouth daily.    . ONE TOUCH ULTRA TEST test strip 1 each by Other route as needed for other.     . simvastatin (ZOCOR) 20 MG tablet Take 20 mg by mouth every evening.     . zolpidem (AMBIEN) 10 MG tablet Take 10 mg by mouth at bedtime.     . furosemide (LASIX) 20 MG tablet Take 1 tablet (20 mg total) by mouth daily. (Patient not taking: Reported on 01/17/2019) 90 tablet 3  . hydroxypropyl methylcellulose / hypromellose (ISOPTO TEARS / GONIOVISC) 2.5 % ophthalmic solution Place 1 drop into both eyes 4 (four) times daily as needed for dry eyes.  No current facility-administered medications for this encounter.     Physical Findings: The patient is in no acute distress. Patient is alert and oriented.  height is 5\' 11"  (1.803 m) and weight is 162 lb 6 oz (73.7 kg). His temporal temperature is 98.6 F (37 C). His blood pressure is 123/67 and his pulse is 96. His respiration is 18 and oxygen saturation is 100%.   Lungs are clear to auscultation bilaterally. Heart has regular rate and rhythm. No palpable cervical, supraclavicular, or axillary adenopathy. Abdomen soft, non-tender, normal bowel sounds.  Lab Findings: Lab Results  Component Value Date   WBC 3.3 (L) 11/14/2018   HGB 14.7 11/14/2018   HCT 45.9 11/14/2018   MCV 79 11/14/2018   PLT 182 11/14/2018    Radiographic Findings: No results found.  Impression:  Stage  IB(T2a, N0, M0) squamous cell carcinoma of the left lower lung. The patient is recovering from the effects of radiation.  Clinically stable status post SBRT for left lower lung lesion. The patient has noticed mild fatigue but reports no other issues at this time. His breathing is good.  Plan:  Patient will be scheduled for CT scan of the chest in 3-4 months and follow up soon afterward.   ____________________________________  Blair Promise, PhD, MD  This document serves as a record of services personally performed by Gery Pray, MD. It was created on his behalf by Rae Lips, a trained medical scribe. The creation of this record is based on the scribe's personal observations and the provider's statements to them. This document has been checked and approved by the attending provider.

## 2019-01-17 NOTE — Patient Instructions (Signed)
Coronavirus (COVID-19) Are you at risk?  Are you at risk for the Coronavirus (COVID-19)?  To be considered HIGH RISK for Coronavirus (COVID-19), you have to meet the following criteria:  . Traveled to [place], [place], [place], [place] or [place]; or in the [place] to [place], [place], [place], or [place]; and have fever, cough, and shortness of breath within the last 2 weeks of travel OR . Been in close contact with a person diagnosed with COVID-19 within the last 2 weeks and have fever, cough, and shortness of breath . IF YOU DO NOT MEET THESE CRITERIA, YOU ARE CONSIDERED LOW RISK FOR COVID-19.  What to do if you are HIGH RISK for COVID-19?  . If you are having a medical emergency, call 911. . Seek medical care right away. Before you go to a doctor's office, urgent care or emergency department, call ahead and tell them about your recent travel, contact with someone diagnosed with COVID-19, and your symptoms. You should receive instructions from your physician's office regarding next steps of care.  . When you arrive at healthcare provider, tell the healthcare staff immediately you have returned from visiting [place], [place], [place], [place] or [place]; or traveled in the [place] to [place], [place], [place], or [place]; in the last two weeks or you have been in close contact with a person diagnosed with COVID-19 in the last 2 weeks.   . Tell the health care staff about your symptoms: fever, cough and shortness of breath. . After you have been seen by a medical provider, you will be either: o Tested for (COVID-19) and discharged home on quarantine except to seek medical care if symptoms worsen, and asked to  - Stay home and avoid contact with others until you get your results (4-5 days)  - Avoid travel on public transportation if possible (such as bus, train, or airplane) or o Sent to the Emergency Department by EMS for evaluation, COVID-19 testing, and possible  admission depending on your condition and test results.  What to do if you are LOW RISK for COVID-19?  Reduce your risk of any infection by using the same precautions used for avoiding the common cold or flu:  . Wash your hands often with soap and warm water for at least 20 seconds.  If soap and water are not readily available, use an alcohol-based hand sanitizer with at least 60% alcohol.  . If coughing or sneezing, cover your mouth and nose by coughing or sneezing into the elbow areas of your shirt or coat, into a tissue or into your sleeve (not your hands). . Avoid shaking hands with others and consider head nods or verbal greetings only. . Avoid touching your eyes, nose, or mouth with unwashed hands.  . Avoid close contact with people who are sick. . Avoid places or events with large numbers of people in one location, like concerts or sporting events. . Carefully consider travel plans you have or are making. . If you are planning any travel outside or inside the [place], visit the CDC's Travelers' Health webpage for the latest health notices. . If you have some symptoms but not all symptoms, continue to monitor at home and seek medical attention if your symptoms worsen. . If you are having a medical emergency, call 911.   ADDITIONAL HEALTHCARE OPTIONS FOR PATIENTS  [facility] / e-Visit: [email or web address]         [facility]: [phone]  [facility]: [phone]                   [facility]: [phone]   

## 2019-01-17 NOTE — Progress Notes (Signed)
[name] presents today for f/u with Dr. Sondra Come. Pt denies c/o pain. Pt reports fatigue is improving. Pt reports occasional non-productive cough. Pt denies hemoptysis. Pt denies difficulty swallowing. Pt denies difficulty breathing.   BP 123/67 (BP Location: Left Arm, Patient Position: Sitting)   Pulse 96   Temp 98.6 F (37 C) (Temporal)   Resp 18   Ht 5\' 11"  (1.803 m)   Wt 162 lb 6 oz (73.7 kg)   SpO2 100%   BMI 22.65 kg/m   Wt Readings from Last 3 Encounters:  01/17/19 162 lb 6 oz (73.7 kg)  01/10/19 154 lb (69.9 kg)  11/22/18 165 lb 12.8 oz (75.2 kg)    Loma Sousa, RN BSN

## 2019-01-23 ENCOUNTER — Telehealth (HOSPITAL_COMMUNITY): Payer: Self-pay | Admitting: Radiology

## 2019-01-23 NOTE — Telephone Encounter (Signed)

## 2019-01-24 ENCOUNTER — Ambulatory Visit (INDEPENDENT_AMBULATORY_CARE_PROVIDER_SITE_OTHER): Payer: Medicare Other

## 2019-01-24 ENCOUNTER — Other Ambulatory Visit: Payer: Self-pay

## 2019-01-24 ENCOUNTER — Ambulatory Visit (HOSPITAL_COMMUNITY): Payer: Medicare Other | Attending: Cardiovascular Disease

## 2019-01-24 VITALS — BP 108/78 | HR 86 | Ht 71.0 in | Wt 157.4 lb

## 2019-01-24 DIAGNOSIS — I5022 Chronic systolic (congestive) heart failure: Secondary | ICD-10-CM

## 2019-01-24 DIAGNOSIS — I2510 Atherosclerotic heart disease of native coronary artery without angina pectoris: Secondary | ICD-10-CM

## 2019-01-24 DIAGNOSIS — I350 Nonrheumatic aortic (valve) stenosis: Secondary | ICD-10-CM

## 2019-01-24 NOTE — Progress Notes (Signed)
Pt presents today for a routine EKG prior to his echo. He had no c/o SOB, CP, or swelling.   Per Dr Tamala Julian, DOD, EKG shows LBBB. No changes.

## 2019-01-29 ENCOUNTER — Telehealth: Payer: Self-pay

## 2019-01-29 DIAGNOSIS — R9430 Abnormal result of cardiovascular function study, unspecified: Secondary | ICD-10-CM

## 2019-01-29 NOTE — Telephone Encounter (Signed)
-----   Message from [name], MD sent at 01/24/2019  8:07 PM EDT ----- Echo showed severe LV dysfunction with EF 20-25% with moderately reduced RVF, stable TAVR.  LVF has not improved on maximum tolerated medical therapy  Please refer to EP for assessment for ICD for primary prevention

## 2019-01-29 NOTE — Telephone Encounter (Signed)
Spoke with the patient's spouse, she wanted to discuss with Dr. Radford Pax before going ahead with the EP referral. She wanted to explain some current changes to his health. The patient is worried that we are not aware of oncology's concerns and wanted to discuss. The patient has not noticed any changes in his symptoms. They are scheduled for an appointment on 03/05/19. She wanted to do an in-person visit.

## 2019-01-30 NOTE — Telephone Encounter (Signed)
I talked with [name]'s wife and explained that he in on the maximal medical therapy that he will tolerate for HF due to soft Bp and with EF still down, recommend referral to EP. He has lung CA but is stage Ia so likely is still a candidate for ICD

## 2019-01-30 NOTE — Telephone Encounter (Signed)
Please let patient's wife know that after talking with her I would like to get a PYP scan before seeing EP to make sure he does not have cardiac amyloid given further reduction in LVF and intolerance to HF meds due to low BP.  Please order PYP amyloid scan

## 2019-01-30 NOTE — Telephone Encounter (Addendum)
The patient's wife, she wanted to know if it was safe to do the PYP scan since he has had so much radiation exposure this year, due to multiple tests. She wanted to know if amyloidosis could be ruled out with an MRI instead.

## 2019-01-31 NOTE — Telephone Encounter (Signed)
Since he had a TAVR I would prefer the PYP scan

## 2019-02-01 NOTE — Telephone Encounter (Signed)
Spoke with the patient's wife, she expressed understanding about having a PYP scan and referral to EP for ICD. Sending message to scheduling.

## 2019-02-04 ENCOUNTER — Other Ambulatory Visit: Payer: Self-pay | Admitting: Physician Assistant

## 2019-02-07 ENCOUNTER — Telehealth (HOSPITAL_COMMUNITY): Payer: Self-pay | Admitting: *Deleted

## 2019-02-07 NOTE — Telephone Encounter (Signed)
LM re: maintenance Cardiac Rehab exercise classes remain on hold at this time due to COVID-19 pandemic precautions.

## 2019-02-13 ENCOUNTER — Telehealth: Payer: Self-pay

## 2019-02-14 ENCOUNTER — Telehealth (HOSPITAL_COMMUNITY): Payer: Self-pay | Admitting: *Deleted

## 2019-02-14 NOTE — Telephone Encounter (Signed)
Called patient to remind him of Amyloid study on 02/20/19 at 1:00.  Patient to arrive at 11:45 for appointment with Dr Rayann Heman.

## 2019-02-19 ENCOUNTER — Institutional Professional Consult (permissible substitution): Payer: Medicare Other | Admitting: Internal Medicine

## 2019-02-19 NOTE — Telephone Encounter (Signed)
Spoke with pts wife regarding appt on 02/20/19. Pts wife stated pt would like an earlier appt. Pt was moved to 9:15 slot. Pts wife was advise to check pts vitals prior to appt. Pts wife questions and concerns were address.

## 2019-02-20 ENCOUNTER — Telehealth (INDEPENDENT_AMBULATORY_CARE_PROVIDER_SITE_OTHER): Payer: Medicare Other | Admitting: Internal Medicine

## 2019-02-20 ENCOUNTER — Ambulatory Visit (HOSPITAL_COMMUNITY): Payer: Medicare Other | Attending: Cardiovascular Disease

## 2019-02-20 ENCOUNTER — Other Ambulatory Visit: Payer: Self-pay

## 2019-02-20 ENCOUNTER — Encounter: Payer: Self-pay | Admitting: Internal Medicine

## 2019-02-20 VITALS — BP 108/70 | HR 106 | Ht 71.0 in | Wt 154.0 lb

## 2019-02-20 DIAGNOSIS — R9430 Abnormal result of cardiovascular function study, unspecified: Secondary | ICD-10-CM | POA: Diagnosis not present

## 2019-02-20 DIAGNOSIS — I5022 Chronic systolic (congestive) heart failure: Secondary | ICD-10-CM

## 2019-02-20 DIAGNOSIS — D500 Iron deficiency anemia secondary to blood loss (chronic): Secondary | ICD-10-CM | POA: Diagnosis not present

## 2019-02-20 DIAGNOSIS — I10 Essential (primary) hypertension: Secondary | ICD-10-CM | POA: Diagnosis not present

## 2019-02-20 DIAGNOSIS — N183 Chronic kidney disease, stage 3 (moderate): Secondary | ICD-10-CM | POA: Diagnosis not present

## 2019-02-20 DIAGNOSIS — I447 Left bundle-branch block, unspecified: Secondary | ICD-10-CM | POA: Diagnosis not present

## 2019-02-20 DIAGNOSIS — C3432 Malignant neoplasm of lower lobe, left bronchus or lung: Secondary | ICD-10-CM

## 2019-02-20 DIAGNOSIS — G4700 Insomnia, unspecified: Secondary | ICD-10-CM | POA: Diagnosis not present

## 2019-02-20 MED ORDER — TECHNETIUM TC 99M PYROPHOSPHATE
22.0000 | Freq: Once | INTRAVENOUS | Status: AC
  Administered 2019-02-20: 22 via INTRAVENOUS

## 2019-02-20 NOTE — Progress Notes (Signed)
Electrophysiology TeleHealth Note   Due to national recommendations of social distancing due to [place] 19, Audio/video telehealth visit is felt to be most appropriate for this patient at this time.  See MyChart message from today for patient consent regarding telehealth for [facility].   Date:  02/20/2019   ID:  [name], DOB 1951/07/05, MRN [id number]  Location: home  Provider location: [address] [place] Evaluation Performed: New patient consult  PCP:  [name], MD  Cardiologist:  Fransico Him, MD Electrophysiologist:  None   Chief Complaint:  CHF  History of Present Illness:    [name] is a 68 y.o. male who presents via audio/video conferencing for a telehealth visit today.   The patient is referred for new consultation regarding CHF and further CV risk stratification by Dr Radford Pax.  The patient has an ischemic CM (EF 20%), AS s/p TAVR also s/p CABG 12/2012. He has had some SOB but mostly feels well.  Active for his age.  Medical therapy has previously been limited by low BP.  He has a h/o recently diagnosed lung CA with treatment ongoing.  He is s/p recent XRT.  He also has had difficulty with low body weight and poor nutrition.  He has follow-up with oncology soon to hear about additional treatment options. Today, he denies symptoms of palpitations, chest pain,  orthopnea, PND, lower extremity edema, claudication, dizziness, presyncope, syncope, bleeding, or neurologic sequela. The patient is tolerating medications without difficulties and is otherwise without complaint today.   he denies symptoms of cough, fevers, chills, or new SOB worrisome for COVID 19.   Past Medical History:  Diagnosis Date  . Acute on chronic combined systolic and diastolic CHF (congestive heart failure) ([place]) 04/16/2018  . Anemia   . Arthritis   . Chronic kidney disease   . Chronic systolic CHF (congestive heart failure) ([place]) 07/04/2018  . Coronary artery disease  02/2012   a. s/p stenting in 2013  b. s/p CABGx2V (SVG--> PDA, SVG--> LCx).  . Diabetes mellitus    neuropathy  insulin dependent  . Dyslipidemia   . Erectile dysfunction   . GERD (gastroesophageal reflux disease)   . History of CVA (cerebrovascular accident)   . History of kidney stones   . Hx of radiation therapy to mediastinum 1985  . Hypertension   . Malignant seminoma of mediastinum ([place]) 1985  . OSA (obstructive sleep apnea) 07/04/2018   Severe obstructive sleep apnea with an AHI of 30/h and mild central sleep apnea at 13.7/h with oxygen desaturations as low as 79%.  Intolerant to PAP therapy  . S/P TAVR (transcatheter aortic valve replacement) 05/15/2018   29 mm Edwards Sapien 3 transcatheter heart valve placed via percutaneous right transfemoral approach   . Severe aortic stenosis     Past Surgical History:  Procedure Laterality Date  . COLONOSCOPY  07/25/2012   Procedure: COLONOSCOPY;  Surgeon: Winfield Cunas., MD;  Location[facility] ENDOSCOPY;  Service: Endoscopy;  Laterality: N/A;  . COLONOSCOPY N/A 12/02/2013   Procedure: COLONOSCOPY;  Surgeon: Winfield Cunas., MD;  Location: [facility] ENDOSCOPY;  Service: Endoscopy;  Laterality: N/A;  . CORONARY ARTERY BYPASS GRAFT N/A 01/04/2013   Procedure: CORONARY ARTERY BYPASS GRAFTING (CABG) times two using right saphenous vein harvested with endoscope.;  Surgeon: Ivin Poot, MD;  Location: [place];  Service: Open Heart Surgery;  Laterality: N/A;  . ESOPHAGOGASTRODUODENOSCOPY  07/20/2012   Procedure: ESOPHAGOGASTRODUODENOSCOPY (EGD);  Surgeon:  Winfield Cunas., MD;  Location: [facility] ENDOSCOPY;  Service: Endoscopy;  Laterality: N/A;  . ESOPHAGOGASTRODUODENOSCOPY N/A 12/02/2013   Procedure: ESOPHAGOGASTRODUODENOSCOPY (EGD);  Surgeon: Winfield Cunas., MD;  Location: [facility] ENDOSCOPY;  Service: Endoscopy;  Laterality: N/A;  . HOT HEMOSTASIS N/A 12/02/2013   Procedure: HOT HEMOSTASIS (ARGON PLASMA COAGULATION/BICAP);  Surgeon: Winfield Cunas., MD;  Location: [facility] ENDOSCOPY;  Service: Endoscopy;  Laterality: N/A;  . INTRAOPERATIVE TRANSESOPHAGEAL ECHOCARDIOGRAM N/A 01/04/2013   Procedure: INTRAOPERATIVE TRANSESOPHAGEAL ECHOCARDIOGRAM;  Surgeon: Ivin Poot, MD;  Location: [place];  Service: Open Heart Surgery;  Laterality: N/A;  . INTRAOPERATIVE TRANSTHORACIC ECHOCARDIOGRAM N/A 05/15/2018   Procedure: INTRAOPERATIVE TRANSTHORACIC ECHOCARDIOGRAM;  Surgeon: Burnell Blanks, MD;  Location: [place];  Service: Open Heart Surgery;  Laterality: N/A;  . IR THORACENTESIS ASP PLEURAL SPACE W/IMG GUIDE  10/19/2018  . LEFT HEART CATHETERIZATION WITH CORONARY ANGIOGRAM N/A 03/06/2012   Procedure: LEFT HEART CATHETERIZATION WITH CORONARY ANGIOGRAM;  Surgeon: [name], MD;  Location: [facility] CATH LAB;  Service: Cardiovascular;  Laterality: N/A;  . LITHOTRIPSY    . [place]  . RADIAL ARTERY HARVEST Left 01/04/2013   Procedure: RADIAL ARTERY HARVEST;  Surgeon: Ivin Poot, MD;  Location: [place];  Service: Vascular;  Laterality: Left;  Artery not havested. Unsuitable for use.  . resection mediastinal seminonma    . RIGHT/LEFT HEART CATH AND CORONARY/GRAFT ANGIOGRAPHY N/A 11/15/2016   Procedure: Right/Left Heart Cath and Coronary/Graft Angiography;  Surgeon: Leonie Man, MD;  Location: [place] CV LAB;  Service: Cardiovascular;  Laterality: N/A;  . RIGHT/LEFT HEART CATH AND CORONARY/GRAFT ANGIOGRAPHY N/A 04/13/2018   Procedure: RIGHT/LEFT HEART CATH AND CORONARY/GRAFT ANGIOGRAPHY;  Surgeon: Jolaine Artist, MD;  Location: [place] CV LAB;  Service: Cardiovascular;  Laterality: N/A;  . TRANSCATHETER AORTIC VALVE REPLACEMENT, TRANSFEMORAL  05/15/2018  . TRANSCATHETER AORTIC VALVE REPLACEMENT, TRANSFEMORAL N/A 05/15/2018   Procedure: TRANSCATHETER AORTIC VALVE REPLACEMENT, TRANSFEMORAL;  Surgeon: Burnell Blanks, MD;  Location: [place];  Service: Open Heart Surgery;  Laterality: N/A;    Current Outpatient Medications   Medication Sig Dispense Refill  . amitriptyline (ELAVIL) 25 MG tablet Take 25 mg by mouth at bedtime.     Marland Kitchen amoxicillin (AMOXIL) 500 MG capsule Take 1 capsule (500 mg total) by mouth as directed. Take 4 tablets (2000 mg ) 1 hour prior to dental procedures. 12 capsule 12  . aspirin 81 MG chewable tablet Chew 1 tablet (81 mg total) by mouth daily.    . B Complex-C (SUPER B COMPLEX PO) Take 1 tablet by mouth daily.    . B-D ULTRAFINE III SHORT PEN 31G X 8 MM MISC 1 each by Other route daily as needed (GLUCOSE TESTING (INSULINE NEEDLE)).     . Cholecalciferol (VITAMIN D-3) 5000 units TABS Take 5,000 Units by mouth daily.    . furosemide (LASIX) 20 MG tablet Take 1 tablet (20 mg total) by mouth daily. 90 tablet 3  . hydroxypropyl methylcellulose / hypromellose (ISOPTO TEARS / GONIOVISC) 2.5 % ophthalmic solution Place 1 drop into both eyes 4 (four) times daily as needed for dry eyes.    . insulin detemir (LEVEMIR) 100 UNIT/ML injection Inject 40 Units into the skin every morning.    Marland Kitchen levothyroxine (SYNTHROID, LEVOTHROID) 50 MCG tablet Take 50 mcg by mouth daily before breakfast.     . metFORMIN (GLUCOPHAGE) 500 MG tablet Take 2 tablets (1,000 mg total) by mouth 2 (two) times daily with a  meal.    . metoprolol succinate (TOPROL-XL) 50 MG 24 hr tablet Take 1 tablet (50 mg total) by mouth daily. Take with or immediately following a meal. Do not take if systolic blood pressure is less than 110. 90 tablet 3  . Multiple Vitamin (MULTIVITAMIN) tablet Take 1 tablet by mouth daily.    . ONE TOUCH ULTRA TEST test strip 1 each by Other route as needed for other.     . simvastatin (ZOCOR) 20 MG tablet Take 20 mg by mouth every evening.     . zolpidem (AMBIEN) 10 MG tablet Take 10 mg by mouth at bedtime.      No current facility-administered medications for this visit.     Allergies:   Lipitor [atorvastatin] and Adhesive [tape]   Social History:  The patient  reports that he quit smoking about 33 years ago.  He has never used smokeless tobacco. He reports that he does not drink alcohol or use drugs.   Family History:  The patient's family history includes Cancer in his brother; Diabetes in his father, mother, and sister; Heart failure in his mother; Hypertension in his mother.    ROS:  Please see the history of present illness.   All other systems are personally reviewed and negative.    Exam:    Vital Signs:  BP 108/70   Pulse (!) 106   Ht 5\' 11"  (1.803 m)   Wt 154 lb (69.9 kg)   BMI 21.48 kg/m   Thin appearing, alert and conversant, regular work of breathing,  good skin color Eyes- anicteric, neuro- grossly intact, skin- no apparent rash or lesions or cyanosis, mouth- oral mucosa is pink   Labs/Other Tests and Data Reviewed:    Recent Labs: 05/11/2018: B Natriuretic Peptide 740.6 05/16/2018: Magnesium 1.9; TSH 3.157 10/25/2018: ALT 33 11/14/2018: BUN 26; Creatinine, Ser 1.25; Hemoglobin 14.7; NT-Pro BNP 935; Platelets 182; Potassium 4.6; Sodium 141   Wt Readings from Last 3 Encounters:  02/20/19 154 lb (69.9 kg)  01/24/19 157 lb 6.4 oz (71.4 kg)  01/17/19 162 lb 6 oz (73.7 kg)     Other studies personally reviewed: Additional studies/ records that were reviewed today include: prior echo,  Dr Landis Gandy notes, oncology notes in epic    ekg 01/24/2019 reveals sinus rhythm, LBBB (QRS 138 msec)  ASSESSMENT & PLAN:    1. Chronic systolic dysfunction/ ischemic CM/ LBBB The patient has currently NYHA Class II symptoms currently. We had a long discussion today about options of conservative management, CRT-D, or CRT-P.  We will need to find out more about his cancer status (upcoming appointments noted) before we consider defibrillator.  If he has active cancer, he would not be an ICD candidate for primary prevention. Today, we discussed options at length.  Using a shared decision making process, he is very clear that at this time, he would prefer a conservative approach and is not interested  in either CRT-D or CRT-P.  He is happy to have recently completed XRT sessions and is not looking for anything "extra" at this time.  He will therefore continue medical therapy as directed by Dr Radford Pax.  I will see as needed.    Current medicines are reviewed at length with the patient today.   The patient does not have concerns regarding his medicines.  The following changes were made today:  none  Labs/ tests ordered today include:  No orders of the defined types were placed in this encounter.   Patient Risk:  after full review of this patients clinical status, I feel that they are at moderate risk at this time.   Today, I have spent 20 minutes with the patient with telehealth technology discussing risk stratification of sudden death .    Signed, Thompson Grayer MD, [place] 02/20/2019 9:19 AM   [facility] [address] [place] [address] [phone] (office) [phone] (fax)

## 2019-03-04 ENCOUNTER — Telehealth: Payer: Self-pay | Admitting: Cardiology

## 2019-03-04 NOTE — Telephone Encounter (Signed)
New Message ° ° ° °Left message to confirm appt and answer covid questions  °

## 2019-03-05 ENCOUNTER — Encounter: Payer: Self-pay | Admitting: Cardiology

## 2019-03-05 ENCOUNTER — Telehealth (INDEPENDENT_AMBULATORY_CARE_PROVIDER_SITE_OTHER): Payer: Medicare Other | Admitting: Cardiology

## 2019-03-05 ENCOUNTER — Other Ambulatory Visit: Payer: Self-pay

## 2019-03-05 VITALS — BP 106/70 | HR 96 | Wt 154.0 lb

## 2019-03-05 DIAGNOSIS — I447 Left bundle-branch block, unspecified: Secondary | ICD-10-CM

## 2019-03-05 DIAGNOSIS — I350 Nonrheumatic aortic (valve) stenosis: Secondary | ICD-10-CM | POA: Diagnosis not present

## 2019-03-05 DIAGNOSIS — I2510 Atherosclerotic heart disease of native coronary artery without angina pectoris: Secondary | ICD-10-CM

## 2019-03-05 DIAGNOSIS — E785 Hyperlipidemia, unspecified: Secondary | ICD-10-CM

## 2019-03-05 DIAGNOSIS — I10 Essential (primary) hypertension: Secondary | ICD-10-CM

## 2019-03-05 DIAGNOSIS — I5022 Chronic systolic (congestive) heart failure: Secondary | ICD-10-CM | POA: Diagnosis not present

## 2019-03-05 DIAGNOSIS — Z952 Presence of prosthetic heart valve: Secondary | ICD-10-CM | POA: Diagnosis not present

## 2019-03-05 DIAGNOSIS — I420 Dilated cardiomyopathy: Secondary | ICD-10-CM

## 2019-03-05 NOTE — Progress Notes (Signed)
Virtual Visit via Video Note   This visit type was conducted due to national recommendations for restrictions regarding the COVID-19 Pandemic (e.g. social distancing) in an effort to limit this patient's exposure and mitigate transmission in our community.  Due to his co-morbid illnesses, this patient is at least at moderate risk for complications without adequate follow up.  This format is felt to be most appropriate for this patient at this time.  All issues noted in this document were discussed and addressed.  A limited physical exam was performed with this format.  Please refer to the patient's chart for his consent to telehealth for [facility].   Evaluation Performed:  Follow-up visit  This visit type was conducted due to national recommendations for restrictions regarding the COVID-19 Pandemic (e.g. social distancing).  This format is felt to be most appropriate for this patient at this time.  All issues noted in this document were discussed and addressed.  No physical exam was performed (except for noted visual exam findings with Video Visits).  Please refer to the patient's chart (MyChart message for video visits and phone note for telephone visits) for the patient's consent to telehealth for [facility].  Date:  03/05/2019   ID:  [name], DOB [birth year], MRN [id number]  Patient Location:  Home  Provider location:   [place]  PCP:  [name], MD  Cardiologist:  Fransico Him, MD  Electrophysiologist:  None   Chief Complaint:  DCM, CHF  History of Present Illness:    [name] is a 68 y.o. male who presents via audio/video conferencing for a telehealth visit today.    [name] Cottenis a 68 y.o.malewith a hx of CAD, aortic stenosis and systolic HF. Hehad anon-STEMI in 2013 treated with PCI of the LCx/OM. LHC in 5/14 demonstrated severe 2 vessel CAD with critical in-stent restenosis in the LCx. He underwent CABG (SVG-PDA, SVG-LCx). He also has a history  of aortic stenosis. Echo 10/2016 showed moderately reduced LVF at 35-40% with diffuse HK, mild AS and moderate AR. Right and Left heart cath showed mild to moderate AS with AVA 0.837cm2 and EF 45% with patent SVG to RPDA and SVG to OM1 With occluded native vessels. He ultimately was diagnosed with low gradient aortic stenosis with AI and underwent TAVR with a 29 mm EdwardsSapein3 THV via transfemoral approach after being admitted with acute exacerbation of CHF on 05/15/2018.  His baseline weight in the past had been around 162lbs bur over the past few months has been running about 154lbs.  .  He has not tolerated Entresto due to hypotension.  He has been on low dose BB.  He only take Lasix PRN for weight > 165lbs and has only been taking recently about twice weekly.  He recently saw Dr. Rayann Heman after repeat 2D echo showed persistently reduced LVF with EF 20-25%.  Due to active lung Ca he was felt not to be a candidate for ICD but Dr. Rayann Heman felt he was a candidate for CRT-D but patient did not want to pursue at that time.    He is here today for followup and is doing well.  He denies any chest pain or pressure, SOB, DOE, PND, orthopnea, LE edema, dizziness, palpitations or syncope. He is compliant with his meds and is tolerating meds with no SE.  THe patient is supposed to have a colonoscopy and he wanted clearance.  The patient does not have symptoms concerning for COVID-19 infection (fever, chills, cough, or  new shortness of breath).    Prior CV studies:   The following studies were reviewed today:  2D echo  Past Medical History:  Diagnosis Date  . Acute on chronic combined systolic and diastolic CHF (congestive heart failure) ([place]) 04/16/2018  . Anemia   . Arthritis   . Chronic kidney disease   . Chronic systolic CHF (congestive heart failure) ([place]) 07/04/2018  . Coronary artery disease 02/2012   a. s/p stenting in 2013  b. s/p CABGx2V (SVG--> PDA, SVG--> LCx).  . Diabetes mellitus     neuropathy  insulin dependent  . Dyslipidemia   . Erectile dysfunction   . GERD (gastroesophageal reflux disease)   . History of CVA (cerebrovascular accident)   . History of kidney stones   . Hx of radiation therapy to mediastinum 1985  . Hypertension   . 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 OSA (obstructive sleep apnea) 07/04/2018   Severe obstructive sleep apnea with an AHI of 30/h and mild central sleep apnea at 13.7/h with oxygen desaturations as low as 79%.  Intolerant to PAP therapy  . S/P TAVR (transcatheter aortic valve replacement) 05/15/2018   29 mm Edwards Sapien 3 transcatheter heart valve placed via percutaneous right transfemoral approach   . Severe aortic stenosis    Past Surgical History:  Procedure Laterality Date  . COLONOSCOPY  07/25/2012   Procedure: COLONOSCOPY;  Surgeon: Winfield Cunas., MD;  Location: [facility] ENDOSCOPY;  Service: Endoscopy;  Laterality: N/A;  . COLONOSCOPY N/A 12/02/2013   Procedure: COLONOSCOPY;  Surgeon: Winfield Cunas., MD;  Location: [facility] ENDOSCOPY;  Service: Endoscopy;  Laterality: N/A;  . CORONARY ARTERY BYPASS GRAFT N/A 01/04/2013   Procedure: CORONARY ARTERY BYPASS GRAFTING (CABG) times two using right saphenous vein harvested with endoscope.;  Surgeon: Ivin Poot, MD;  Location: [place];  Service: Open Heart Surgery;  Laterality: N/A;  . ESOPHAGOGASTRODUODENOSCOPY  07/20/2012   Procedure: ESOPHAGOGASTRODUODENOSCOPY (EGD);  Surgeon: Winfield Cunas., MD;  Location: [facility] ENDOSCOPY;  Service: Endoscopy;  Laterality: N/A;  . ESOPHAGOGASTRODUODENOSCOPY N/A 12/02/2013   Procedure: ESOPHAGOGASTRODUODENOSCOPY (EGD);  Surgeon: Winfield Cunas., MD;  Location: [facility] ENDOSCOPY;  Service: Endoscopy;  Laterality: N/A;  . HOT HEMOSTASIS N/A 12/02/2013   Procedure: HOT HEMOSTASIS (ARGON PLASMA COAGULATION/BICAP);  Surgeon: Winfield Cunas., MD;  Location: [facility] ENDOSCOPY;  Service: Endoscopy;  Laterality: N/A;  . INTRAOPERATIVE TRANSESOPHAGEAL  ECHOCARDIOGRAM N/A 01/04/2013   Procedure: INTRAOPERATIVE TRANSESOPHAGEAL ECHOCARDIOGRAM;  Surgeon: Ivin Poot, MD;  Location: [place];  Service: Open Heart Surgery;  Laterality: N/A;  . INTRAOPERATIVE TRANSTHORACIC ECHOCARDIOGRAM N/A 05/15/2018   Procedure: INTRAOPERATIVE TRANSTHORACIC ECHOCARDIOGRAM;  Surgeon: Burnell Blanks, MD;  Location: [place];  Service: Open Heart Surgery;  Laterality: N/A;  . IR THORACENTESIS ASP PLEURAL SPACE W/IMG GUIDE  10/19/2018  . LEFT HEART CATHETERIZATION WITH CORONARY ANGIOGRAM N/A 03/06/2012   Procedure: LEFT HEART CATHETERIZATION WITH CORONARY ANGIOGRAM;  Surgeon: [name], MD;  Location: [place] CATH LAB;  Service: Cardiovascular;  Laterality: N/A;  . LITHOTRIPSY    . [place]  . RADIAL ARTERY HARVEST Left 01/04/2013   Procedure: RADIAL ARTERY HARVEST;  Surgeon: Ivin Poot, MD;  Location: [place];  Service: Vascular;  Laterality: Left;  Artery not havested. Unsuitable for use.  . resection mediastinal seminonma    . RIGHT/LEFT HEART CATH AND CORONARY/GRAFT ANGIOGRAPHY N/A 11/15/2016   Procedure: Right/Left Heart Cath and Coronary/Graft Angiography;  Surgeon: Leonie Man, MD;  Location:  [place] INVASIVE CV LAB;  Service: Cardiovascular;  Laterality: N/A;  . RIGHT/LEFT HEART CATH AND CORONARY/GRAFT ANGIOGRAPHY N/A 04/13/2018   Procedure: RIGHT/LEFT HEART CATH AND CORONARY/GRAFT ANGIOGRAPHY;  Surgeon: Jolaine Artist, MD;  Location: [place] CV LAB;  Service: Cardiovascular;  Laterality: N/A;  . TRANSCATHETER AORTIC VALVE REPLACEMENT, TRANSFEMORAL  05/15/2018  . TRANSCATHETER AORTIC VALVE REPLACEMENT, TRANSFEMORAL N/A 05/15/2018   Procedure: TRANSCATHETER AORTIC VALVE REPLACEMENT, TRANSFEMORAL;  Surgeon: Burnell Blanks, MD;  Location: [place];  Service: Open Heart Surgery;  Laterality: N/A;     Current Meds  Medication Sig  . amitriptyline (ELAVIL) 25 MG tablet Take 25 mg by mouth at bedtime.   Marland Kitchen amoxicillin (AMOXIL) 500 MG  capsule Take 1 capsule (500 mg total) by mouth as directed. Take 4 tablets (2000 mg ) 1 hour prior to dental procedures.  Marland Kitchen aspirin 81 MG chewable tablet Chew 1 tablet (81 mg total) by mouth daily.  . B Complex-C (SUPER B COMPLEX PO) Take 1 tablet by mouth daily.  . B-D ULTRAFINE III SHORT PEN 31G X 8 MM MISC 1 each by Other route daily as needed (GLUCOSE TESTING (INSULINE NEEDLE)).   . Cholecalciferol (VITAMIN D-3) 5000 units TABS Take 5,000 Units by mouth daily.  . furosemide (LASIX) 20 MG tablet Take 1 tablet (20 mg total) by mouth daily. (Patient taking differently: Take 20 mg by mouth daily as needed. )  . hydroxypropyl methylcellulose / hypromellose (ISOPTO TEARS / GONIOVISC) 2.5 % ophthalmic solution Place 1 drop into both eyes 4 (four) times daily as needed for dry eyes.  . insulin detemir (LEVEMIR) 100 UNIT/ML injection Inject 40 Units into the skin every morning.  Marland Kitchen levothyroxine (SYNTHROID, LEVOTHROID) 50 MCG tablet Take 50 mcg by mouth daily before breakfast.   . metFORMIN (GLUCOPHAGE) 500 MG tablet Take 2 tablets (1,000 mg total) by mouth 2 (two) times daily with a meal.  . metoprolol succinate (TOPROL-XL) 50 MG 24 hr tablet Take 1 tablet (50 mg total) by mouth daily. Take with or immediately following a meal. Do not take if systolic blood pressure is less than 110.  . Multiple Vitamin (MULTIVITAMIN) tablet Take 1 tablet by mouth daily.  . ONE TOUCH ULTRA TEST test strip 1 each by Other route as needed for other.   . simvastatin (ZOCOR) 20 MG tablet Take 20 mg by mouth every evening.   . zolpidem (AMBIEN) 10 MG tablet Take 10 mg by mouth at bedtime.      Allergies:   Lipitor [atorvastatin] and Adhesive [tape]   Social History   Tobacco Use  . Smoking status: Former Smoker    Quit date: 03/06/1985    Years since quitting: 34.0  . Smokeless tobacco: Never Used  Substance Use Topics  . Alcohol use: No  . Drug use: No     Family Hx: The patient's family history includes Cancer  in his brother; Diabetes in his father, mother, and sister; Heart failure in his mother; Hypertension in his mother.  ROS:   Please see the history of present illness.     All other systems reviewed and are negative.   Labs/Other Tests and Data Reviewed:    Recent Labs: 05/11/2018: B Natriuretic Peptide 740.6 05/16/2018: Magnesium 1.9; TSH 3.157 10/25/2018: ALT 33 11/14/2018: BUN 26; Creatinine, Ser 1.25; Hemoglobin 14.7; NT-Pro BNP 935; Platelets 182; Potassium 4.6; Sodium 141   Recent Lipid Panel Lab Results  Component Value Date/Time   CHOL 95 04/12/2018 02:16 AM   CHOL 104  03/28/2018 08:50 AM   TRIG 36 04/12/2018 02:16 AM   HDL 33 (L) 04/12/2018 02:16 AM   HDL 35 (L) 03/28/2018 08:50 AM   CHOLHDL 2.9 04/12/2018 02:16 AM   LDLCALC 55 04/12/2018 02:16 AM   LDLCALC 58 03/28/2018 08:50 AM    Wt Readings from Last 3 Encounters:  03/05/19 154 lb (69.9 kg)  02/20/19 154 lb (69.9 kg)  02/20/19 154 lb (69.9 kg)     Objective:    Vital Signs:  BP 106/70   Pulse 96   Wt 154 lb (69.9 kg)   BMI 21.48 kg/m    CONSTITUTIONAL:  Well nourished, well developed male in no acute distress.  EYES: anicteric MOUTH: oral mucosa is pink RESPIRATORY: Normal respiratory effort, symmetric expansion CARDIOVASCULAR: No peripheral edema SKIN: No rash, lesions or ulcers MUSCULOSKELETAL: no digital cyanosis NEURO: Cranial Nerves II-XII grossly intact, moves all extremities PSYCH: Intact judgement and insight.  A&O x 3, Mood/affect appropriate   ASSESSMENT & PLAN:    1.  Chronic systolic CHF  - His baseline weight is 162lbs but over the past few months has been running 154lbs..   -His echo in 2019 showed moderate to severely reduced LV function with EF 30 to 35% with hypokinesis of the anterior septum and inferior wall. Repeat echo 01/2019 showed EF 20-25% -He has only been taking Lasix 20mg  PRN and usually takes about 2 times weekly -He did not tolerate Entresto due to hypotension  -continue on Toprol XL 50mg  daily  2.  ASCAD  -s/p non-STEMI in 2013 treated with PCI of the LCx/OM. - LHC in 5/14 demonstrated severe 2 vessel CAD with critical in-stent restenosis in the LCx.  -He underwent CABG (SVG-PDA, SVG-LCx). -Repeat left heart cath showed patent SVG to RPDA, SVG to OM1, 50% ostial D1, 40% proximal to mid LAD, occluded ostial RCA, occluded ostial left circumflex, 40% ostial LAD.   -He has not had any anginal symptoms.  -He will continue on aspirin, beta-blocker and statin therapy.  3.  Hypertension  -BP has been very well controlled -he has been tolerating Toprol-XL 50 mg daily.    4.  Severe low gradient low outputAS - S/P TAVR 10-1-19with29 mm EdwardsSapein3 THV via transfemoral approach. -2D echo 07/04/2018 showed a stable aortic valve TAVR with mild perivalvular AR. Mean aortic valve gradient 6 mmHg.  -He will continue on ASA 81mg  daily.  -Plavix has now been stopped as he is 6 months out from TAVR.  5.  Hyperlipidemia  -his LDL goal is less than 70.   -His LDL was 55 in August.  - He will continue on Zocor 40 mg daily.  6.  Left lower lung mass  - consistent with invasive squamous cell CA by biopsy.   -He is now status post radiation therapy.  I told him that he was fine to undergo colonoscopy  COVID-19 Education: The signs and symptoms of COVID-19 were discussed with the patient and how to seek care for testing (follow up with PCP or arrange E-visit).  The importance of social distancing was discussed today.  Patient Risk:   After full review of this patient's clinical status, I feel that they are at least moderate risk at this time.  Time:   Today, I have spent 20 minutes directly with the patient on telemedicine discussing medical problems including HTN, DCM, CHF.  We also reviewed the symptoms of COVID 19 and the ways to protect against contracting the virus with telehealth technology.  I spent  an additional 5 minutes reviewing  patient's chart including 2D echo, labs.  Medication Adjustments/Labs and Tests Ordered: Current medicines are reviewed at length with the patient today.  Concerns regarding medicines are outlined above.  Tests Ordered: No orders of the defined types were placed in this encounter.  Medication Changes: No orders of the defined types were placed in this encounter.   Disposition:  Follow up in 3 month(s) virtual  Signed, Fransico Him, MD  03/05/2019 8:50 PM    [facility]

## 2019-03-05 NOTE — Patient Instructions (Signed)
Called and discussed the following with pt. He had no additional questions or needs at this time.  Medication Instructions:  Your physician recommends that you continue on your current medications as directed. Please refer to the Current Medication list given to you today.  Labwork: None ordered.  Testing/Procedures: None ordered.  Follow-Up:  You have a follow up virtual appointment scheduled for Oct 22 @ 9AM with Dr Radford Pax  Any Other Special Instructions Will Be Listed Below (If Applicable).     If you need a refill on your cardiac medications before your next appointment, please call your pharmacy.

## 2019-03-26 ENCOUNTER — Telehealth: Payer: Self-pay | Admitting: *Deleted

## 2019-03-26 DIAGNOSIS — N529 Male erectile dysfunction, unspecified: Secondary | ICD-10-CM | POA: Diagnosis not present

## 2019-03-26 DIAGNOSIS — E7800 Pure hypercholesterolemia, unspecified: Secondary | ICD-10-CM | POA: Diagnosis not present

## 2019-03-26 DIAGNOSIS — N189 Chronic kidney disease, unspecified: Secondary | ICD-10-CM | POA: Diagnosis not present

## 2019-03-26 DIAGNOSIS — I10 Essential (primary) hypertension: Secondary | ICD-10-CM | POA: Diagnosis not present

## 2019-03-26 DIAGNOSIS — E11319 Type 2 diabetes mellitus with unspecified diabetic retinopathy without macular edema: Secondary | ICD-10-CM | POA: Diagnosis not present

## 2019-03-26 DIAGNOSIS — E039 Hypothyroidism, unspecified: Secondary | ICD-10-CM | POA: Diagnosis not present

## 2019-03-26 DIAGNOSIS — C3490 Malignant neoplasm of unspecified part of unspecified bronchus or lung: Secondary | ICD-10-CM | POA: Diagnosis not present

## 2019-03-26 DIAGNOSIS — E1165 Type 2 diabetes mellitus with hyperglycemia: Secondary | ICD-10-CM | POA: Diagnosis not present

## 2019-03-26 NOTE — Telephone Encounter (Signed)
   [facility] Pre-operative Risk Assessment    Request for surgical clearance:  1. What type of surgery is being performed? COLONOSCOPY   2. When is this surgery scheduled? TBD   3. What type of clearance is required (medical clearance vs. Pharmacy clearance to hold med vs. Both)? MEDICAL  4. Are there any medications that need to be held prior to surgery and how long? NONE LISTED; THOUGH PT IS ON ASA   5. Practice name and name of physician performing surgery? [facility]; DR. Oletta Lamas   6. What is your office phone number [phone]    7.   What is your office fax number [phone]  8.   Anesthesia type (None, local, MAC, general) ? NONE LISTED, PROPOFOL?   Julaine Hua 03/26/2019, 3:42 PM  _________________________________________________________________   (provider comments below)

## 2019-03-27 NOTE — Telephone Encounter (Signed)
LMTCB- his wife said he had a dizzy spell yesterday.  Kerin Ransom PA-C 03/27/2019 1:40 PM

## 2019-03-29 NOTE — Telephone Encounter (Signed)
I called today, the patient would like to speak with Dr Sondra Come before proceeding with colonoscopy.  He has an appointment sept 3d.  He will let us know after this.  Kerin Ransom PA-C 03/29/2019 2:24 PM

## 2019-04-08 ENCOUNTER — Other Ambulatory Visit: Payer: Self-pay | Admitting: Physician Assistant

## 2019-04-08 DIAGNOSIS — Z952 Presence of prosthetic heart valve: Secondary | ICD-10-CM

## 2019-04-09 ENCOUNTER — Telehealth: Payer: Self-pay | Admitting: *Deleted

## 2019-04-09 ENCOUNTER — Ambulatory Visit: Payer: Medicare Other | Admitting: Cardiology

## 2019-04-09 NOTE — Telephone Encounter (Signed)
CALLED PATIENT TO INFORM OF CT FOR 04-17-19 - ARRIVAL TIME- 11:15 AM @ [facility], NO RESTRICTIONS TO TEST, AND PATIENT TO FU WITH DR. KINARD ON 04-18-19 @ 11:30 AM FOR RESULTS, SPOKE WITH PATIENT'S WIFE- FAITH AND SHE IS AWARE OF THESE APPTS.

## 2019-04-17 ENCOUNTER — Ambulatory Visit (HOSPITAL_COMMUNITY)
Admission: RE | Admit: 2019-04-17 | Discharge: 2019-04-17 | Disposition: A | Discharge status: Home / Self Care | Payer: Medicare Other | Source: Ambulatory Visit | Attending: Radiation Oncology | Admitting: Radiation Oncology

## 2019-04-17 ENCOUNTER — Encounter (HOSPITAL_COMMUNITY): Payer: Self-pay

## 2019-04-17 ENCOUNTER — Other Ambulatory Visit: Payer: Self-pay

## 2019-04-17 DIAGNOSIS — C3432 Malignant neoplasm of lower lobe, left bronchus or lung: Secondary | ICD-10-CM

## 2019-04-17 DIAGNOSIS — C3490 Malignant neoplasm of unspecified part of unspecified bronchus or lung: Secondary | ICD-10-CM | POA: Diagnosis not present

## 2019-04-18 ENCOUNTER — Other Ambulatory Visit: Payer: Self-pay

## 2019-04-18 ENCOUNTER — Encounter: Payer: Self-pay | Admitting: Radiation Oncology

## 2019-04-18 ENCOUNTER — Ambulatory Visit
Admission: RE | Admit: 2019-04-18 | Discharge: 2019-04-18 | Disposition: A | Discharge status: Home / Self Care | Payer: Medicare Other | Source: Ambulatory Visit | Attending: Radiation Oncology | Admitting: Radiation Oncology

## 2019-04-18 VITALS — BP 113/67 | HR 86 | Temp 98.9°F | Resp 18 | Ht 71.0 in | Wt 159.4 lb

## 2019-04-18 DIAGNOSIS — Z7982 Long term (current) use of aspirin: Secondary | ICD-10-CM | POA: Insufficient documentation

## 2019-04-18 DIAGNOSIS — Z794 Long term (current) use of insulin: Secondary | ICD-10-CM | POA: Insufficient documentation

## 2019-04-18 DIAGNOSIS — Z79899 Other long term (current) drug therapy: Secondary | ICD-10-CM | POA: Insufficient documentation

## 2019-04-18 DIAGNOSIS — R0602 Shortness of breath: Secondary | ICD-10-CM | POA: Insufficient documentation

## 2019-04-18 DIAGNOSIS — C3432 Malignant neoplasm of lower lobe, left bronchus or lung: Secondary | ICD-10-CM

## 2019-04-18 DIAGNOSIS — R911 Solitary pulmonary nodule: Secondary | ICD-10-CM | POA: Diagnosis not present

## 2019-04-18 DIAGNOSIS — Z923 Personal history of irradiation: Secondary | ICD-10-CM | POA: Insufficient documentation

## 2019-04-18 DIAGNOSIS — Z08 Encounter for follow-up examination after completed treatment for malignant neoplasm: Secondary | ICD-10-CM | POA: Diagnosis not present

## 2019-04-18 DIAGNOSIS — I700 Atherosclerosis of aorta: Secondary | ICD-10-CM | POA: Diagnosis not present

## 2019-04-18 DIAGNOSIS — R05 Cough: Secondary | ICD-10-CM | POA: Diagnosis not present

## 2019-04-18 DIAGNOSIS — Z85118 Personal history of other malignant neoplasm of bronchus and lung: Secondary | ICD-10-CM | POA: Diagnosis not present

## 2019-04-18 NOTE — Patient Instructions (Signed)
Coronavirus (COVID-19) Are you at risk?  Are you at risk for the Coronavirus (COVID-19)?  To be considered HIGH RISK for Coronavirus (COVID-19), you have to meet the following criteria:  . Traveled to [place], [place], [place], [place] or [place]; or in the [place] to [place], [place], [place], or [place]; and have fever, cough, and shortness of breath within the last 2 weeks of travel OR . Been in close contact with a person diagnosed with COVID-19 within the last 2 weeks and have fever, cough, and shortness of breath . IF YOU DO NOT MEET THESE CRITERIA, YOU ARE CONSIDERED LOW RISK FOR COVID-19.  What to do if you are HIGH RISK for COVID-19?  . If you are having a medical emergency, call 911. . Seek medical care right away. Before you go to a doctor's office, urgent care or emergency department, call ahead and tell them about your recent travel, contact with someone diagnosed with COVID-19, and your symptoms. You should receive instructions from your physician's office regarding next steps of care.  . When you arrive at healthcare provider, tell the healthcare staff immediately you have returned from visiting [place], [place], [place], [place] or [place]; or traveled in the [place] to [place], [place], [place], or [place]; in the last two weeks or you have been in close contact with a person diagnosed with COVID-19 in the last 2 weeks.   . Tell the health care staff about your symptoms: fever, cough and shortness of breath. . After you have been seen by a medical provider, you will be either: o Tested for (COVID-19) and discharged home on quarantine except to seek medical care if symptoms worsen, and asked to  - Stay home and avoid contact with others until you get your results (4-5 days)  - Avoid travel on public transportation if possible (such as bus, train, or airplane) or o Sent to the Emergency Department by EMS for evaluation, COVID-19 testing, and possible  admission depending on your condition and test results.  What to do if you are LOW RISK for COVID-19?  Reduce your risk of any infection by using the same precautions used for avoiding the common cold or flu:  . Wash your hands often with soap and warm water for at least 20 seconds.  If soap and water are not readily available, use an alcohol-based hand sanitizer with at least 60% alcohol.  . If coughing or sneezing, cover your mouth and nose by coughing or sneezing into the elbow areas of your shirt or coat, into a tissue or into your sleeve (not your hands). . Avoid shaking hands with others and consider head nods or verbal greetings only. . Avoid touching your eyes, nose, or mouth with unwashed hands.  . Avoid close contact with people who are sick. . Avoid places or events with large numbers of people in one location, like concerts or sporting events. . Carefully consider travel plans you have or are making. . If you are planning any travel outside or inside the [place], visit the CDC's Travelers' Health webpage for the latest health notices. . If you have some symptoms but not all symptoms, continue to monitor at home and seek medical attention if your symptoms worsen. . If you are having a medical emergency, call 911.   ADDITIONAL HEALTHCARE OPTIONS FOR PATIENTS  [facility] / e-Visit: [email or web address]         [facility]: [phone]  [facility]: [phone]                   [facility]: [phone]   

## 2019-04-18 NOTE — Progress Notes (Signed)
Pt presents today for f/u with Dr. Sondra Come. Pt denies c/o pain, especially chest. Pt reports occasional, non-productive cough. Pt denies hemoptysis. Pt denies difficulty breathing but does report occasional SOB with exertion. Pt denies difficulty swallowing.   BP 113/67 (BP Location: Left Arm, Patient Position: Sitting)   Pulse 86   Temp 98.9 F (37.2 C) (Temporal)   Resp 18   Ht 5\' 11"  (1.803 m)   Wt 159 lb 6 oz (72.3 kg)   SpO2 99%   BMI 22.23 kg/m   Wt Readings from Last 3 Encounters:  04/18/19 159 lb 6 oz (72.3 kg)  03/05/19 154 lb (69.9 kg)  02/20/19 154 lb (69.9 kg)   Loma Sousa, RN BSN

## 2019-04-18 NOTE — Progress Notes (Signed)
Radiation Oncology         [phone] ________________________________  Name: [name] MRN: [id number]  Date: 04/18/2019  DOB: 03-20-1951  Follow-Up Visit Note  CC: [name], MD  [name], MD    ICD-10-CM   1. Squamous cell carcinoma of bronchus in left lower lobe (HCC)  C34.32 CT Chest Wo Contrast  2. Nodule of lower lobe of left lung  R91.1     Diagnosis:   68 y.o. 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 Details of this treatment are not available. The patient reports having several weeks of radiation therapy. He reports his treatments were given at [facility].  Narrative:  The patient returns today for routine follow-up and to review recent scans.  He underwent chest CT yesterday, 04/17/2019, which showed: radiation changes, which limited visualization of the lung lesion; within the limitation, the primary lung lesions appears decreased in size; no new or progressive disease.  On review of systems, he reports occasional non-productive cough and occasional shortness of breath with exertion. He denies pain, hemoptysis, and difficulty swallowing.  ALLERGIES:  is allergic to lipitor [atorvastatin] and adhesive [tape].  Meds: Current Outpatient Medications  Medication Sig Dispense Refill   amitriptyline (ELAVIL) 25 MG tablet Take 25 mg by mouth at bedtime.      amoxicillin (AMOXIL) 500 MG capsule Take 1 capsule (500 mg total) by mouth as directed. Take 4 tablets (2000 mg ) 1 hour prior to dental procedures. 12 capsule 12   aspirin 81 MG chewable tablet Chew 1 tablet (81 mg total) by mouth daily.     B Complex-C (SUPER B COMPLEX PO) Take 1 tablet by mouth daily.     B-D ULTRAFINE III SHORT PEN 31G X 8 MM MISC 1 each by Other  route daily as needed (GLUCOSE TESTING (INSULINE NEEDLE)).      Cholecalciferol (VITAMIN D-3) 5000 units TABS Take 5,000 Units by mouth daily.     furosemide (LASIX) 20 MG tablet Take 1 tablet (20 mg total) by mouth daily. (Patient taking differently: Take 20 mg by mouth daily as needed. ) 90 tablet 3   hydroxypropyl methylcellulose / hypromellose (ISOPTO TEARS / GONIOVISC) 2.5 % ophthalmic solution Place 1 drop into both eyes 4 (four) times daily as needed for dry eyes.     insulin detemir (LEVEMIR) 100 UNIT/ML injection Inject 40 Units into the skin every morning.     levothyroxine (SYNTHROID, LEVOTHROID) 50 MCG tablet Take 50 mcg by mouth daily before breakfast.      metFORMIN (GLUCOPHAGE) 500 MG tablet Take 2 tablets (1,000 mg total) by mouth 2 (two) times daily with a meal.     metoprolol succinate (TOPROL-XL) 50 MG 24 hr tablet Take 1 tablet (50 mg total) by mouth daily. Take with or immediately following a meal. Do not take if systolic blood pressure is less than 110. 90 tablet 3   Multiple Vitamin (MULTIVITAMIN) tablet Take 1 tablet by mouth daily.     ONE TOUCH ULTRA TEST test strip 1 each by Other route as needed for other.      simvastatin (ZOCOR) 20 MG tablet Take 20 mg by mouth every evening.      zolpidem (AMBIEN) 10 MG tablet Take  10 mg by mouth at bedtime.      No current facility-administered medications for this encounter.     Physical Findings: The patient is in no acute distress. Patient is alert and oriented.  height is 5\' 11"  (1.803 m) and weight is 159 lb 6 oz (72.3 kg). His temporal temperature is 98.9 F (37.2 C). His blood pressure is 113/67 and his pulse is 86. His respiration is 18 and oxygen saturation is 99%.   Lungs are clear to auscultation bilaterally. Heart has regular rate and rhythm. No palpable cervical, supraclavicular, or axillary adenopathy. Abdomen soft, non-tender, normal bowel sounds.  Lab Findings: Lab Results  Component Value Date   WBC  3.3 (L) 11/14/2018   HGB 14.7 11/14/2018   HCT 45.9 11/14/2018   MCV 79 11/14/2018   PLT 182 11/14/2018    Radiographic Findings: Ct Chest Wo Contrast  Result Date: 04/17/2019 CLINICAL DATA:  followup non-small cell lung cancer. Status post XRT. EXAM: CT CHEST WITHOUT CONTRAST TECHNIQUE: Multidetector CT imaging of the chest was performed following the standard protocol without IV contrast. COMPARISON:  CT chest 10/03/2018 and PET-CT 10/17/2018 FINDINGS: Cardiovascular: Mild cardia no enlarged axillary or supraclavicular adenopathy. Mild cardiac enlargement. Aortic atherosclerosis. Previous median sternotomy and aortic valvuloplasty. No pericardial effusion. Three vessel coronary artery atherosclerotic calcifications. Mediastinum/Nodes: Normal appearance of the thyroid gland. The trachea appears patent and is midline. Normal appearance of the esophagus. No mediastinal adenopathy. The hilar structures are suboptimally evaluated secondary to lack of IV contrast material. Lungs/Pleura: No pleural effusion. Within the superior segment of left upper lobe there is new airspace consolidation, ground-glass attenuation and architectural distortion following a geographic distribution and compatible with changes due to external beam radiation. Radiation changes diminishes ability to accurately identified margins of the primary lung lesion. Best estimate, the lung mass measures 1.8 cm, image 77/7. This is compared with 3 cm on CT chest from 10/03/2018. Pulmonary nodule within the right upper lobe measures 6 mm, image 80/7. Previously this measured the same. No new suspicious nodule or mass identified. Upper Abdomen: No acute abnormality. Musculoskeletal: No chest wall mass or suspicious bone lesions identified. IMPRESSION: 1. Interval development of airspace consolidation, architectural distortion and ground-glass attenuation following a geographic distribution within the superior segment of right lower lobe compatible  with changes secondary to external beam radiation. This diminishes visualization of underlying lung lesion. Within this limitation the primary lung lesion appears decreased in size from previous exam. No new or progressive disease identified. 2. Aortic Atherosclerosis (ICD10-I70.0). Coronary artery calcifications. Electronically Signed   By: Kerby Moors M.D.   On: 04/17/2019 15:33    Impression:  Stage IB(T2a, N0, M0) squamous cell carcinoma of the left lower lung.   Clinically NED. Recent CT scan also shows no active disease within the chest. We discussed the CT scan today carefully, and the patient appears to understand.  Plan:  Routine follow up in 6 months, and he will have another CT scan prior to or after this visit.  ____________________________________  Blair Promise, PhD, MD  This document serves as a record of services personally performed by Gery Pray, MD. It was created on his behalf by Wilburn Mylar, a trained medical scribe. The creation of this record is based on the scribe's personal observations and the provider's statements to them. This document has been checked and approved by the attending provider.

## 2019-05-02 ENCOUNTER — Telehealth: Payer: Self-pay | Admitting: Cardiology

## 2019-05-02 NOTE — Telephone Encounter (Signed)
New Message    Patient has been out of work due to being a high risk person and needs an extension to the work note for his job to stay out longer.  Please call patient back to advise.

## 2019-05-02 NOTE — Telephone Encounter (Signed)
Ok to give out of work note due to ongoing COVID crisis and his high risk cardiac problems

## 2019-05-02 NOTE — Telephone Encounter (Signed)
Will route to Dr. Radford Pax for review and advisement.

## 2019-05-03 NOTE — Telephone Encounter (Signed)
Letter has been written and set via [place] [employer]. Pt is aware and had no additional questions.

## 2019-05-03 NOTE — Telephone Encounter (Signed)
Extend until December for now

## 2019-05-03 NOTE — Telephone Encounter (Signed)
Will write letter for patient to continue "out of work." Prior to writing, will ask Dr. Radford Pax for date of return/reassessment.   To Dr. Radford Pax.

## 2019-05-07 NOTE — Telephone Encounter (Signed)
Received 2nd surgery clearance request today. I am resending into the Pre Op pool the original clearance. See notes from Kerin Ransom, [facility], pt was going to check with Dr. Sondra Come before proceeding with colonoscopy.

## 2019-05-09 NOTE — Telephone Encounter (Signed)
Successfully faxed via Epic fax to [facility].

## 2019-05-09 NOTE — Telephone Encounter (Signed)
   Primary Cardiologist: Fransico Him, MD  Chart reviewed as part of pre-operative protocol coverage. Patient was contacted 05/09/2019 in reference to pre-operative risk assessment for pending surgery as outlined below.   [name] and his wife stated they will wait for colonoscopy. They do no want to have this done while also having radiation treatment for his lung cancer. I asked them to let us know when he wished to proceed with colonoscopy and we will clear him at that time.   Pre-op covering staff: - Please contact requesting surgeon's office via preferred method (i.e, phone, fax) to inform them of the above decision.  Tami Lin Bradin Mcadory, PA 05/09/2019, 9:59 AM

## 2019-05-29 DIAGNOSIS — Z23 Encounter for immunization: Secondary | ICD-10-CM | POA: Diagnosis not present

## 2019-06-04 ENCOUNTER — Telehealth: Payer: Self-pay | Admitting: *Deleted

## 2019-06-04 NOTE — Telephone Encounter (Signed)
consented

## 2019-06-05 ENCOUNTER — Other Ambulatory Visit: Payer: Self-pay

## 2019-06-05 ENCOUNTER — Ambulatory Visit (HOSPITAL_COMMUNITY): Payer: Medicare Other | Attending: Cardiology

## 2019-06-05 DIAGNOSIS — Z952 Presence of prosthetic heart valve: Secondary | ICD-10-CM | POA: Insufficient documentation

## 2019-06-06 ENCOUNTER — Telehealth (INDEPENDENT_AMBULATORY_CARE_PROVIDER_SITE_OTHER): Payer: Medicare Other | Admitting: Cardiology

## 2019-06-06 VITALS — BP 112/70 | HR 100 | Ht 71.0 in | Wt 154.0 lb

## 2019-06-06 DIAGNOSIS — I5022 Chronic systolic (congestive) heart failure: Secondary | ICD-10-CM

## 2019-06-06 DIAGNOSIS — I350 Nonrheumatic aortic (valve) stenosis: Secondary | ICD-10-CM

## 2019-06-06 DIAGNOSIS — I10 Essential (primary) hypertension: Secondary | ICD-10-CM | POA: Diagnosis not present

## 2019-06-06 DIAGNOSIS — I2510 Atherosclerotic heart disease of native coronary artery without angina pectoris: Secondary | ICD-10-CM

## 2019-06-06 DIAGNOSIS — E785 Hyperlipidemia, unspecified: Secondary | ICD-10-CM | POA: Diagnosis not present

## 2019-06-06 NOTE — Addendum Note (Signed)
Addended by: Thompson Grayer on: 06/06/2019 09:53 AM   Modules accepted: Orders

## 2019-06-06 NOTE — Progress Notes (Signed)
Virtual Visit via Telephone Note   This visit type was conducted due to national recommendations for restrictions regarding the COVID-19 Pandemic (e.g. social distancing) in an effort to limit this patient's exposure and mitigate transmission in our community.  Due to his co-morbid illnesses, this patient is at least at moderate risk for complications without adequate follow up.  This format is felt to be most appropriate for this patient at this time.  The patient did not have access to video technology/had technical difficulties with video requiring transitioning to audio format only (telephone).  All issues noted in this document were discussed and addressed.  No physical exam could be performed with this format.  Please refer to the patient's chart for his  consent to telehealth for [facility].   Evaluation Performed:  Follow-up visit  This visit type was conducted due to national recommendations for restrictions regarding the COVID-19 Pandemic (e.g. social distancing).  This format is felt to be most appropriate for this patient at this time.  All issues noted in this document were discussed and addressed.  No physical exam was performed (except for noted visual exam findings with Video Visits).  Please refer to the patient's chart (MyChart message for video visits and phone note for telephone visits) for the patient's consent to telehealth for [facility].  Date:  06/06/2019   ID:  [name], DOB 1950/08/28, MRN [id number]  Patient Location:  Home  Provider location:   [place]  PCP:  [name], MD  Cardiologist:  Fransico Him, MD  Electrophysiologist:  None   Chief Complaint:  DCM, CHF  History of Present Illness:    [name] is a 68 y.o. male who presents via audio/video conferencing for a telehealth visit today.    Amol E Cottenis a 68 y.o.malewith a hx of CAD, aortic stenosis and systolic HF. Hehad anon-STEMI in 2013 treated with PCI of the  LCx/OM. LHC in 5/14 demonstrated severe 2 vessel CAD with critical in-stent restenosis in the LCx. He underwent CABG (SVG-PDA, SVG-LCx). He also has a history of aortic stenosis. Echo 10/2016 showed moderately reduced LVF at 35-40% with diffuse HK, mild AS and moderate AR. Right and Left heart cath showed mild to moderate AS with AVA 0.837cm2 and EF 45% with patent SVG to RPDA and SVG to OM1 With occluded native vessels. He ultimately was diagnosed with low gradient aortic stenosis with AI and underwent TAVR with a 29 mm EdwardsSapein3 THV via transfemoral approach after being admitted with acute exacerbation of CHF on 05/15/2018.  His baseline weight in the past had been around 162lbs but over the past few months has been running about 154lbs.  He has not tolerated Entresto due to hypotension.  He has been on low dose BB.  He only take Lasix PRN for weight > 165lbs and has only been taking recently about twice weekly.  He recently saw Dr. Rayann Heman after repeat 2D echo showed persistently reduced LVF with EF 20-25%.  Due to active lung Ca he was felt not to be a candidate for ICD but Dr. Rayann Heman felt he was a candidate for CRT-D but patient did not want to pursue at that time.    He is here today for followup and is doing well.  He denies any chest pain or pressure,  PND, orthopnea, LE edema, dizziness, palpitations or syncope. He has chronic DOE which is very stable. He is compliant with his meds and is tolerating meds with no SE.  The patient does not have symptoms concerning for COVID-19 infection (fever, chills, cough, or new shortness of breath).   Prior CV studies:   The following studies were reviewed today:  none  Past Medical History:  Diagnosis Date  . Acute on chronic combined systolic and diastolic CHF (congestive heart failure) ([place]) 04/16/2018  . Anemia   . Arthritis   . Chronic kidney disease   . Chronic systolic CHF (congestive heart failure) ([place]) 07/04/2018  . Coronary artery  disease 02/2012   a. s/p stenting in 2013  b. s/p CABGx2V (SVG--> PDA, SVG--> LCx).  . Diabetes mellitus    neuropathy  insulin dependent  . Dyslipidemia   . Erectile dysfunction   . GERD (gastroesophageal reflux disease)   . History of CVA (cerebrovascular accident)   . History of kidney stones   . Hx of radiation therapy to mediastinum 1985  . Hypertension   . 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 OSA (obstructive sleep apnea) 07/04/2018   Severe obstructive sleep apnea with an AHI of 30/h and mild central sleep apnea at 13.7/h with oxygen desaturations as low as 79%.  Intolerant to PAP therapy  . S/P TAVR (transcatheter aortic valve replacement) 05/15/2018   29 mm Edwards Sapien 3 transcatheter heart valve placed via percutaneous right transfemoral approach   . Severe aortic stenosis    Past Surgical History:  Procedure Laterality Date  . COLONOSCOPY  07/25/2012   Procedure: COLONOSCOPY;  Surgeon: Winfield Cunas., MD;  Location: [facility] ENDOSCOPY;  Service: Endoscopy;  Laterality: N/A;  . COLONOSCOPY N/A 12/02/2013   Procedure: COLONOSCOPY;  Surgeon: Winfield Cunas., MD;  Location: [facility] ENDOSCOPY;  Service: Endoscopy;  Laterality: N/A;  . CORONARY ARTERY BYPASS GRAFT N/A 01/04/2013   Procedure: CORONARY ARTERY BYPASS GRAFTING (CABG) times two using right saphenous vein harvested with endoscope.;  Surgeon: Ivin Poot, MD;  Location: [place];  Service: Open Heart Surgery;  Laterality: N/A;  . ESOPHAGOGASTRODUODENOSCOPY  07/20/2012   Procedure: ESOPHAGOGASTRODUODENOSCOPY (EGD);  Surgeon: Winfield Cunas., MD;  Location: [facility] ENDOSCOPY;  Service: Endoscopy;  Laterality: N/A;  . ESOPHAGOGASTRODUODENOSCOPY N/A 12/02/2013   Procedure: ESOPHAGOGASTRODUODENOSCOPY (EGD);  Surgeon: Winfield Cunas., MD;  Location: [facility] ENDOSCOPY;  Service: Endoscopy;  Laterality: N/A;  . HOT HEMOSTASIS N/A 12/02/2013   Procedure: HOT HEMOSTASIS (ARGON PLASMA COAGULATION/BICAP);  Surgeon: Winfield Cunas., MD;  Location: [facility] ENDOSCOPY;  Service: Endoscopy;  Laterality: N/A;  . INTRAOPERATIVE TRANSESOPHAGEAL ECHOCARDIOGRAM N/A 01/04/2013   Procedure: INTRAOPERATIVE TRANSESOPHAGEAL ECHOCARDIOGRAM;  Surgeon: Ivin Poot, MD;  Location: [place];  Service: Open Heart Surgery;  Laterality: N/A;  . INTRAOPERATIVE TRANSTHORACIC ECHOCARDIOGRAM N/A 05/15/2018   Procedure: INTRAOPERATIVE TRANSTHORACIC ECHOCARDIOGRAM;  Surgeon: Burnell Blanks, MD;  Location: [place];  Service: Open Heart Surgery;  Laterality: N/A;  . IR THORACENTESIS ASP PLEURAL SPACE W/IMG GUIDE  10/19/2018  . LEFT HEART CATHETERIZATION WITH CORONARY ANGIOGRAM N/A 03/06/2012   Procedure: LEFT HEART CATHETERIZATION WITH CORONARY ANGIOGRAM;  Surgeon: [name], MD;  Location: [place] CATH LAB;  Service: Cardiovascular;  Laterality: N/A;  . LITHOTRIPSY    . [place]  . RADIAL ARTERY HARVEST Left 01/04/2013   Procedure: RADIAL ARTERY HARVEST;  Surgeon: Ivin Poot, MD;  Location: [place];  Service: Vascular;  Laterality: Left;  Artery not havested. Unsuitable for use.  . resection mediastinal seminonma    . RIGHT/LEFT HEART CATH AND CORONARY/GRAFT ANGIOGRAPHY N/A 11/15/2016   Procedure: Right/Left Heart  Cath and Coronary/Graft Angiography;  Surgeon: Leonie Man, MD;  Location: [place] CV LAB;  Service: Cardiovascular;  Laterality: N/A;  . RIGHT/LEFT HEART CATH AND CORONARY/GRAFT ANGIOGRAPHY N/A 04/13/2018   Procedure: RIGHT/LEFT HEART CATH AND CORONARY/GRAFT ANGIOGRAPHY;  Surgeon: Jolaine Artist, MD;  Location: [place] CV LAB;  Service: Cardiovascular;  Laterality: N/A;  . TRANSCATHETER AORTIC VALVE REPLACEMENT, TRANSFEMORAL  05/15/2018  . TRANSCATHETER AORTIC VALVE REPLACEMENT, TRANSFEMORAL N/A 05/15/2018   Procedure: TRANSCATHETER AORTIC VALVE REPLACEMENT, TRANSFEMORAL;  Surgeon: Burnell Blanks, MD;  Location: [place];  Service: Open Heart Surgery;  Laterality: N/A;     Current Meds   Medication Sig  . amitriptyline (ELAVIL) 25 MG tablet Take 25 mg by mouth at bedtime.   Marland Kitchen amoxicillin (AMOXIL) 500 MG capsule Take 1 capsule (500 mg total) by mouth as directed. Take 4 tablets (2000 mg ) 1 hour prior to dental procedures.  Marland Kitchen aspirin 81 MG chewable tablet Chew 1 tablet (81 mg total) by mouth daily.  . B Complex-C (SUPER B COMPLEX PO) Take 1 tablet by mouth daily.  . B-D ULTRAFINE III SHORT PEN 31G X 8 MM MISC 1 each by Other route daily as needed (GLUCOSE TESTING (INSULINE NEEDLE)).   . Cholecalciferol (VITAMIN D-3) 5000 units TABS Take 5,000 Units by mouth daily.  . furosemide (LASIX) 20 MG tablet Take 1 tablet (20 mg total) by mouth daily. (Patient taking differently: Take 20 mg by mouth daily as needed. )  . hydroxypropyl methylcellulose / hypromellose (ISOPTO TEARS / GONIOVISC) 2.5 % ophthalmic solution Place 1 drop into both eyes 4 (four) times daily as needed for dry eyes.  . insulin detemir (LEVEMIR) 100 UNIT/ML injection Inject 40 Units into the skin every morning.  Marland Kitchen levothyroxine (SYNTHROID, LEVOTHROID) 50 MCG tablet Take 50 mcg by mouth daily before breakfast.   . metFORMIN (GLUCOPHAGE) 500 MG tablet Take 2 tablets (1,000 mg total) by mouth 2 (two) times daily with a meal.  . metoprolol succinate (TOPROL-XL) 50 MG 24 hr tablet Take 1 tablet (50 mg total) by mouth daily. Take with or immediately following a meal. Do not take if systolic blood pressure is less than 110.  . Multiple Vitamin (MULTIVITAMIN) tablet Take 1 tablet by mouth daily.  . ONE TOUCH ULTRA TEST test strip 1 each by Other route as needed for other.   . simvastatin (ZOCOR) 20 MG tablet Take 20 mg by mouth every evening.   . zolpidem (AMBIEN) 10 MG tablet Take 10 mg by mouth at bedtime.      Allergies:   Lipitor [atorvastatin] and Adhesive [tape]   Social History   Tobacco Use  . Smoking status: Former Smoker    Quit date: 03/06/1985    Years since quitting: 34.2  . Smokeless tobacco: Never Used   Substance Use Topics  . Alcohol use: No  . Drug use: No     Family Hx: The patient's family history includes Cancer in his brother; Diabetes in his father, mother, and sister; Heart failure in his mother; Hypertension in his mother.  ROS:   Please see the history of present illness.     All other systems reviewed and are negative.   Labs/Other Tests and Data Reviewed:    Recent Labs: 10/25/2018: ALT 33 11/14/2018: BUN 26; Creatinine, Ser 1.25; Hemoglobin 14.7; NT-Pro BNP 935; Platelets 182; Potassium 4.6; Sodium 141   Recent Lipid Panel Lab Results  Component Value Date/Time   CHOL 95 04/12/2018 02:16 AM  CHOL 104 03/28/2018 08:50 AM   TRIG 36 04/12/2018 02:16 AM   HDL 33 (L) 04/12/2018 02:16 AM   HDL 35 (L) 03/28/2018 08:50 AM   CHOLHDL 2.9 04/12/2018 02:16 AM   LDLCALC 55 04/12/2018 02:16 AM   LDLCALC 58 03/28/2018 08:50 AM    Wt Readings from Last 3 Encounters:  06/06/19 154 lb (69.9 kg)  04/18/19 159 lb 6 oz (72.3 kg)  03/05/19 154 lb (69.9 kg)     Objective:    Vital Signs:  BP 112/70   Pulse 100   Ht 5\' 11"  (1.803 m)   Wt 154 lb (69.9 kg)   BMI 21.48 kg/m     ASSESSMENT & PLAN:    1.Chronic systolic CHF -His baseline weight 154lbs and has been very stable -His echo in 2019 showed moderate to severely reduced LV function with EF 30 to 35% with hypokinesis of the anterior septum and inferior wall. Repeat echo 01/2019 showed EF 20-25% -He has only been taking Lasix 20mg  PRN and usually takes about 2 times weekly -He did not tolerate Entresto due to hypotension -continue on Toprol XL 50mg  daily -NYHA class II -following in EP clinic and Dr. Rayann Heman wanted to wait until full extent of CA determined after XRT therapy and patient released from Radiation oncology -recent CT scan showed no evidence of CA but there was some scarring.   -followup with Dr. Rayann Heman in march  2.ASCAD -s/p non-STEMI in 2013 treated with PCI of the LCx/OM. -LHC in 5/14  demonstrated severe 2 vessel CAD with critical in-stent restenosis in the LCx.  -He underwent CABG (SVG-PDA, SVG-LCx). -Repeat left heart cath showed patent SVG to RPDA, SVG to OM1, 50% ostial D1, 40% proximal to mid LAD, occluded ostial RCA, occluded ostial left circumflex, 40% ostial LAD. -He has not had any anginal symptoms.  -He will continue on aspirin, beta-blocker and statin therapy.  3. Hypertension  -BP has been very well controlled -he has been tolerating Toprol-XL 50 mg daily.   4.Severe low gradient low outputAS -S/P TAVR 10-1-19with29 mm EdwardsSapein3 THV via transfemoral approach. -2D echo 06/05/2019 showed a stable aortic valve TAVR with trivial perivalvular AR. Mean aortic valve gradient 8 mmHg.  -He will continue on ASA 81mg  daily.   5. Hyperlipidemia -his LDL goal is less than 70.  -check FLP and ALT.  -He will continue on Zocor 40 mg daily.  6. Left lower lung mass  -consistent with invasive squamous cell CA by biopsy.  -He is now status post radiationtherapy.   COVID-19 Education: The signs and symptoms of COVID-19 were discussed with the patient and how to seek care for testing (follow up with PCP or arrange E-visit).  The importance of social distancing was discussed today.  Patient Risk:   After full review of this patient's clinical status, I feel that they are at least moderate risk at this time.  Time:   Today, I have spent 20 minutes directly with the patient on telemedicine discussing medical problems including HLD, AS, HTN, CHF, CAD.  We also reviewed the symptoms of COVID 19 and the ways to protect against contracting the virus with telehealth technology.  I spent an additional 5 minutes reviewing patient's chart including labs.  Medication Adjustments/Labs and Tests Ordered: Current medicines are reviewed at length with the patient today.  Concerns regarding medicines are outlined above.  Tests Ordered: No orders of the  defined types were placed in this encounter.  Medication Changes: No orders of the  defined types were placed in this encounter.   Disposition:  Follow up in 6 month(s)  Signed, Fransico Him, MD  06/06/2019 9:13 AM    [facility]

## 2019-06-06 NOTE — Patient Instructions (Addendum)
Medication Instructions:  Your physician recommends that you continue on your current medications as directed. Please refer to the Current Medication list given to you today.  *If you need a refill on your cardiac medications before your next appointment, please call your pharmacy*  Lab Work: Your physician recommends that you return for lab work on October 29,2020 at 8:45.  This will be fasting--Lipid and ALT  If you have labs (blood work) drawn today and your tests are completely normal, you will receive your results only by: Marland Kitchen MyChart Message (if you have MyChart) OR . A paper copy in the mail If you have any lab test that is abnormal or we need to change your treatment, we will call you to review the results.  Testing/Procedures: none  Follow-Up: At [facility], you and your health needs are our priority.  As part of our continuing mission to provide you with exceptional heart care, we have created designated Provider Care Teams.  These Care Teams include your primary Cardiologist (physician) and Advanced Practice Providers (APPs -  Physician Assistants and Nurse Practitioners) who all work together to provide you with the care you need, when you need it.  Your next appointment:   6 months  The format for your next appointment:   Virtual Visit   Provider:   You may see Fransico Him, MD or one of the following Advanced Practice Providers on your designated Care Team:    Melina Copa, PA-C  Ermalinda Barrios, PA-C   Other Instructions Follow up with Dr Rayann Heman in March 2021

## 2019-06-13 ENCOUNTER — Other Ambulatory Visit: Payer: Medicare Other | Admitting: *Deleted

## 2019-06-13 ENCOUNTER — Other Ambulatory Visit: Payer: Self-pay

## 2019-06-13 DIAGNOSIS — I2510 Atherosclerotic heart disease of native coronary artery without angina pectoris: Secondary | ICD-10-CM

## 2019-06-13 DIAGNOSIS — E785 Hyperlipidemia, unspecified: Secondary | ICD-10-CM

## 2019-06-13 LAB — LIPID PANEL
Chol/HDL Ratio: 3 ratio (ref 0.0–5.0)
Cholesterol, Total: 127 mg/dL (ref 100–199)
HDL: 42 mg/dL (ref >39)
LDL Chol Calc (NIH): 74 mg/dL (ref 0–99)
Triglycerides: 47 mg/dL (ref 0–149)
VLDL Cholesterol Cal: 11 mg/dL (ref 5–40)

## 2019-06-13 LAB — ALT: ALT: 17 IU/L (ref 0–44)

## 2019-06-14 ENCOUNTER — Telehealth: Payer: Self-pay

## 2019-06-14 NOTE — Telephone Encounter (Signed)
-----   Message from [name], MD sent at 06/13/2019  5:28 PM EDT ----- Stable labs - continue current meds and forward to PCP

## 2019-06-14 NOTE — Telephone Encounter (Signed)
Notes recorded by Frederik Schmidt, RN on 06/14/2019 at 3:39 PM EDT  The patient has been notified of the result and verbalized understanding. All questions (if any) were answered.  Frederik Schmidt, RN 06/14/2019 3:39 PM

## 2019-06-19 ENCOUNTER — Other Ambulatory Visit (HOSPITAL_COMMUNITY): Payer: Medicare Other

## 2019-07-24 ENCOUNTER — Encounter: Payer: Self-pay | Admitting: *Deleted

## 2019-08-13 DIAGNOSIS — M25511 Pain in right shoulder: Secondary | ICD-10-CM | POA: Diagnosis not present

## 2019-08-13 DIAGNOSIS — M6281 Muscle weakness (generalized): Secondary | ICD-10-CM | POA: Diagnosis not present

## 2019-08-13 DIAGNOSIS — M25512 Pain in left shoulder: Secondary | ICD-10-CM | POA: Diagnosis not present

## 2019-08-14 ENCOUNTER — Telehealth: Payer: Self-pay | Admitting: Cardiology

## 2019-08-14 NOTE — Telephone Encounter (Signed)
Patient would like to extend his release from work until 10/17/2019. Will forward to Dr. Radford Pax for approval.

## 2019-08-14 NOTE — Telephone Encounter (Signed)
New message  Patient is calling in for an extension on the letter to stay out of work due to having underlying health conditions due to the risk of COVID-19. Patient was previously written out of work from 06/06/2019 to 08/15/2019, but would like an extension to 10/17/2019. Please give patient a call back to confirm and place letter in [place].

## 2019-08-15 NOTE — Telephone Encounter (Signed)
OK to extend until March 4th

## 2019-08-20 ENCOUNTER — Other Ambulatory Visit: Payer: Self-pay | Admitting: Cardiology

## 2019-08-20 DIAGNOSIS — M6281 Muscle weakness (generalized): Secondary | ICD-10-CM | POA: Diagnosis not present

## 2019-08-20 DIAGNOSIS — M25512 Pain in left shoulder: Secondary | ICD-10-CM | POA: Diagnosis not present

## 2019-08-20 DIAGNOSIS — M25511 Pain in right shoulder: Secondary | ICD-10-CM | POA: Diagnosis not present

## 2019-08-21 DIAGNOSIS — Z0001 Encounter for general adult medical examination with abnormal findings: Secondary | ICD-10-CM | POA: Diagnosis not present

## 2019-08-21 DIAGNOSIS — N1831 Chronic kidney disease, stage 3a: Secondary | ICD-10-CM | POA: Diagnosis not present

## 2019-08-21 DIAGNOSIS — E039 Hypothyroidism, unspecified: Secondary | ICD-10-CM | POA: Diagnosis not present

## 2019-08-21 DIAGNOSIS — Z125 Encounter for screening for malignant neoplasm of prostate: Secondary | ICD-10-CM | POA: Diagnosis not present

## 2019-08-21 MED ORDER — METOPROLOL SUCCINATE ER 50 MG PO TB24: 50.0000 mg | ORAL_TABLET | Freq: Every day | ORAL | 2 refills | Status: DC

## 2019-08-23 DIAGNOSIS — M6281 Muscle weakness (generalized): Secondary | ICD-10-CM | POA: Diagnosis not present

## 2019-08-23 DIAGNOSIS — M25511 Pain in right shoulder: Secondary | ICD-10-CM | POA: Diagnosis not present

## 2019-08-23 DIAGNOSIS — M25512 Pain in left shoulder: Secondary | ICD-10-CM | POA: Diagnosis not present

## 2019-08-27 ENCOUNTER — Telehealth: Payer: Self-pay | Admitting: Cardiology

## 2019-08-27 DIAGNOSIS — M25511 Pain in right shoulder: Secondary | ICD-10-CM | POA: Diagnosis not present

## 2019-08-27 DIAGNOSIS — M25512 Pain in left shoulder: Secondary | ICD-10-CM | POA: Diagnosis not present

## 2019-08-27 DIAGNOSIS — M6281 Muscle weakness (generalized): Secondary | ICD-10-CM | POA: Diagnosis not present

## 2019-08-27 NOTE — Telephone Encounter (Signed)
New message:    Patient wife calling to ask if it is ok for her to come with him to his appt. Please call back.

## 2019-08-27 NOTE — Telephone Encounter (Signed)
Spoke with patient's wife and advised her that we were not allowing anyone to come up with patient's at this time unless there was a physical or mental disability. She states that she just wanted to talk with Dr. Radford Pax about his increased SOB. I advised her that we could put her on the phone during the visit so she could hear everything and ask questions. She verbalized understanding.

## 2019-08-28 DIAGNOSIS — E1165 Type 2 diabetes mellitus with hyperglycemia: Secondary | ICD-10-CM | POA: Diagnosis not present

## 2019-08-28 DIAGNOSIS — I10 Essential (primary) hypertension: Secondary | ICD-10-CM | POA: Diagnosis not present

## 2019-08-28 DIAGNOSIS — E039 Hypothyroidism, unspecified: Secondary | ICD-10-CM | POA: Diagnosis not present

## 2019-08-28 DIAGNOSIS — E7800 Pure hypercholesterolemia, unspecified: Secondary | ICD-10-CM | POA: Diagnosis not present

## 2019-08-29 ENCOUNTER — Other Ambulatory Visit: Payer: Self-pay

## 2019-08-29 ENCOUNTER — Encounter: Payer: Self-pay | Admitting: Cardiology

## 2019-08-29 ENCOUNTER — Ambulatory Visit (INDEPENDENT_AMBULATORY_CARE_PROVIDER_SITE_OTHER): Payer: Medicare Other | Admitting: Cardiology

## 2019-08-29 VITALS — BP 122/78 | HR 109 | Ht 71.0 in | Wt 157.8 lb

## 2019-08-29 DIAGNOSIS — I5022 Chronic systolic (congestive) heart failure: Secondary | ICD-10-CM | POA: Diagnosis not present

## 2019-08-29 DIAGNOSIS — R0609 Other forms of dyspnea: Secondary | ICD-10-CM

## 2019-08-29 DIAGNOSIS — I10 Essential (primary) hypertension: Secondary | ICD-10-CM

## 2019-08-29 DIAGNOSIS — I2510 Atherosclerotic heart disease of native coronary artery without angina pectoris: Secondary | ICD-10-CM

## 2019-08-29 DIAGNOSIS — I471 Supraventricular tachycardia: Secondary | ICD-10-CM

## 2019-08-29 DIAGNOSIS — E785 Hyperlipidemia, unspecified: Secondary | ICD-10-CM

## 2019-08-29 DIAGNOSIS — R0600 Dyspnea, unspecified: Secondary | ICD-10-CM | POA: Diagnosis not present

## 2019-08-29 DIAGNOSIS — C3432 Malignant neoplasm of lower lobe, left bronchus or lung: Secondary | ICD-10-CM | POA: Diagnosis not present

## 2019-08-29 DIAGNOSIS — I350 Nonrheumatic aortic (valve) stenosis: Secondary | ICD-10-CM | POA: Diagnosis not present

## 2019-08-29 MED ORDER — METOPROLOL SUCCINATE ER 25 MG PO TB24: 75.0000 mg | ORAL_TABLET | Freq: Every day | ORAL | 3 refills | Status: DC

## 2019-08-29 NOTE — Patient Instructions (Addendum)
Medication Instructions:  Your physician has recommended you make the following change in your medication:   1) INCREASE your Toprol (metoprolol succinate) to 75 mg daily (1.5 tablets)  *If you need a refill on your cardiac medications before your next appointment, please call your pharmacy*  Testing/Procedures: Your physician has requested that you have an echocardiogram. Echocardiography is a painless test that uses sound waves to create images of your heart. It provides your doctor with information about the size and shape of your heart and how well your heart's chambers and valves are working. This procedure takes approximately one hour. There are no restrictions for this procedure.  Your physician has requested that you have a lexiscan myoview. For further information please visit [email or web address]. Please follow instruction sheet, as given.  Follow-Up: At [facility], you and your health needs are our priority.  As part of our continuing mission to provide you with exceptional heart care, we have created designated Provider Care Teams.  These Care Teams include your primary Cardiologist (physician) and Advanced Practice Providers (APPs -  Physician Assistants and Nurse Practitioners) who all work together to provide you with the care you need, when you need it.  Nurse Visit: EKG next week   Your next appointment:   6 month(s)  The format for your next appointment:   In Person  Provider:   Fransico Him, MD  Other Instructions COVID-19 Vaccine Information can be found at: [email or web address] For questions related to vaccine distribution or appointments, please email [name] or call [phone].

## 2019-08-29 NOTE — Progress Notes (Signed)
Cardiology Office Note:    Date:  08/29/2019   ID:  [name], DOB 07-01-1951, MRN [id number]  PCP:  [name], MD  Cardiologist:  Fransico Him, MD    Referring MD: [name], MD   Chief Complaint  Patient presents with  . Coronary Artery Disease  . Aortic Stenosis  . Congestive Heart Failure  . Hypertension  . Hyperlipidemia    History of Present Illness:    LAMARR FEENSTRA is a 69 y.o. male with a hx of CAD, aortic stenosis and systolic HF. Hehad anon-STEMI in 2013 treated with PCI of the LCx/OM. LHC in 5/14 demonstrated severe 2 vessel CAD with critical in-stent restenosis in the LCx. He underwent CABG (SVG-PDA, SVG-LCx). He also has a history of aortic stenosis. Echo 10/2016 showed moderately reduced LVF at 35-40% with diffuse HK, mild AS and moderate AR. Right and Left heart cath showed mild to moderate AS with AVA 0.837cm2 and EF 45% with patent SVG to RPDA and SVG to OM1 With occluded native vessels. He ultimately was diagnosed with low gradient aortic stenosis with AI and underwent TAVR with a 29 mm EdwardsSapein3 THV via transfemoral approach after being admitted with acute exacerbation of CHF on 05/15/2018.  His baseline weightin the past had been around 162lbs but over the past few months has been running about 154lbs. He has not tolerated Entresto due to hypotension. He has been on low dose BB. He only take Lasix PRN for weight >165lbs and has only been taking recently about twice weekly. He recently saw Dr. Rayann Heman after repeat 2D echo showed persistently reduced LVF with EF 20-25%. Due to active lung Ca he was felt not to be a candidate for ICD but Dr. Rayann Heman felt he was a candidate for CRT-D but patient did not want to pursue at that time.   He is here today for followup and had been doing well but recently he started feeling SOB.  He says that this occurred rather abruptly.  He has not had any chest pain or pressure.  He has chronic LE edema which  has been stable and his weight has been very stable from 154-157lbs.  He also has had spells where he will be in bed and will suddenly feel like he cannot breath and have to get up and walk around.  He sleeps on 2 pillows for comfort but when he gets one of these spells he will have to sit upright.    He denies any  dizziness, palpitations or syncope. He is compliant with his meds and is tolerating meds with no SE.    Past Medical History:  Diagnosis Date  . Acute on chronic combined systolic and diastolic CHF (congestive heart failure) ([place]) 04/16/2018  . Anemia   . Arthritis   . Chronic kidney disease   . Chronic systolic CHF (congestive heart failure) ([place]) 07/04/2018  . Coronary artery disease 02/2012   a. s/p stenting in 2013  b. s/p CABGx2V (SVG--> PDA, SVG--> LCx).  . Diabetes mellitus    neuropathy  insulin dependent  . Dyslipidemia   . Erectile dysfunction   . GERD (gastroesophageal reflux disease)   . History of CVA (cerebrovascular accident)   . History of kidney stones   . Hx of radiation therapy to mediastinum 1985  . Hypertension   . 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 OSA (obstructive sleep apnea) 07/04/2018   Severe obstructive sleep apnea with an AHI of 30/h and mild central  sleep apnea at 13.7/h with oxygen desaturations as low as 79%.  Intolerant to PAP therapy  . S/P TAVR (transcatheter aortic valve replacement) 05/15/2018   29 mm Edwards Sapien 3 transcatheter heart valve placed via percutaneous right transfemoral approach   . Severe aortic stenosis     Past Surgical History:  Procedure Laterality Date  . COLONOSCOPY  07/25/2012   Procedure: COLONOSCOPY;  Surgeon: Winfield Cunas., MD;  Location: [facility] ENDOSCOPY;  Service: Endoscopy;  Laterality: N/A;  . COLONOSCOPY N/A 12/02/2013   Procedure: COLONOSCOPY;  Surgeon: Winfield Cunas., MD;  Location: [facility] ENDOSCOPY;  Service: Endoscopy;  Laterality: N/A;  . CORONARY ARTERY BYPASS GRAFT N/A 01/04/2013    Procedure: CORONARY ARTERY BYPASS GRAFTING (CABG) times two using right saphenous vein harvested with endoscope.;  Surgeon: Ivin Poot, MD;  Location: [place];  Service: Open Heart Surgery;  Laterality: N/A;  . ESOPHAGOGASTRODUODENOSCOPY  07/20/2012   Procedure: ESOPHAGOGASTRODUODENOSCOPY (EGD);  Surgeon: Winfield Cunas., MD;  Location: [facility] ENDOSCOPY;  Service: Endoscopy;  Laterality: N/A;  . ESOPHAGOGASTRODUODENOSCOPY N/A 12/02/2013   Procedure: ESOPHAGOGASTRODUODENOSCOPY (EGD);  Surgeon: Winfield Cunas., MD;  Location: [facility] ENDOSCOPY;  Service: Endoscopy;  Laterality: N/A;  . HOT HEMOSTASIS N/A 12/02/2013   Procedure: HOT HEMOSTASIS (ARGON PLASMA COAGULATION/BICAP);  Surgeon: Winfield Cunas., MD;  Location: [facility] ENDOSCOPY;  Service: Endoscopy;  Laterality: N/A;  . INTRAOPERATIVE TRANSESOPHAGEAL ECHOCARDIOGRAM N/A 01/04/2013   Procedure: INTRAOPERATIVE TRANSESOPHAGEAL ECHOCARDIOGRAM;  Surgeon: Ivin Poot, MD;  Location: [place];  Service: Open Heart Surgery;  Laterality: N/A;  . INTRAOPERATIVE TRANSTHORACIC ECHOCARDIOGRAM N/A 05/15/2018   Procedure: INTRAOPERATIVE TRANSTHORACIC ECHOCARDIOGRAM;  Surgeon: Burnell Blanks, MD;  Location: [place];  Service: Open Heart Surgery;  Laterality: N/A;  . IR THORACENTESIS ASP PLEURAL SPACE W/IMG GUIDE  10/19/2018  . LEFT HEART CATHETERIZATION WITH CORONARY ANGIOGRAM N/A 03/06/2012   Procedure: LEFT HEART CATHETERIZATION WITH CORONARY ANGIOGRAM;  Surgeon: [name], MD;  Location: [place] CATH LAB;  Service: Cardiovascular;  Laterality: N/A;  . LITHOTRIPSY    . [place]  . RADIAL ARTERY HARVEST Left 01/04/2013   Procedure: RADIAL ARTERY HARVEST;  Surgeon: Ivin Poot, MD;  Location: [place];  Service: Vascular;  Laterality: Left;  Artery not havested. Unsuitable for use.  . resection mediastinal seminonma    . RIGHT/LEFT HEART CATH AND CORONARY/GRAFT ANGIOGRAPHY N/A 11/15/2016   Procedure: Right/Left Heart Cath and Coronary/Graft  Angiography;  Surgeon: Leonie Man, MD;  Location: [place] CV LAB;  Service: Cardiovascular;  Laterality: N/A;  . RIGHT/LEFT HEART CATH AND CORONARY/GRAFT ANGIOGRAPHY N/A 04/13/2018   Procedure: RIGHT/LEFT HEART CATH AND CORONARY/GRAFT ANGIOGRAPHY;  Surgeon: Jolaine Artist, MD;  Location: [place] CV LAB;  Service: Cardiovascular;  Laterality: N/A;  . TRANSCATHETER AORTIC VALVE REPLACEMENT, TRANSFEMORAL  05/15/2018  . TRANSCATHETER AORTIC VALVE REPLACEMENT, TRANSFEMORAL N/A 05/15/2018   Procedure: TRANSCATHETER AORTIC VALVE REPLACEMENT, TRANSFEMORAL;  Surgeon: Burnell Blanks, MD;  Location: [place];  Service: Open Heart Surgery;  Laterality: N/A;    Current Medications: Current Meds  Medication Sig  . amitriptyline (ELAVIL) 25 MG tablet Take 25 mg by mouth at bedtime.   Marland Kitchen amoxicillin (AMOXIL) 500 MG capsule Take 1 capsule (500 mg total) by mouth as directed. Take 4 tablets (2000 mg ) 1 hour prior to dental procedures.  Marland Kitchen aspirin 81 MG chewable tablet Chew 1 tablet (81 mg total) by mouth daily.  . B Complex-C (SUPER B COMPLEX PO) Take 1  tablet by mouth daily.  . B-D ULTRAFINE III SHORT PEN 31G X 8 MM MISC 1 each by Other route daily as needed (GLUCOSE TESTING (INSULINE NEEDLE)).   . Cholecalciferol (VITAMIN D-3) 5000 units TABS Take 5,000 Units by mouth daily.  . furosemide (LASIX) 20 MG tablet Take 1 tablet (20 mg total) by mouth daily. (Patient taking differently: Take 20 mg by mouth daily as needed. )  . hydroxypropyl methylcellulose / hypromellose (ISOPTO TEARS / GONIOVISC) 2.5 % ophthalmic solution Place 1 drop into both eyes 4 (four) times daily as needed for dry eyes.  . insulin detemir (LEVEMIR) 100 UNIT/ML injection Inject 40 Units into the skin every morning.  Marland Kitchen levothyroxine (SYNTHROID, LEVOTHROID) 50 MCG tablet Take 50 mcg by mouth daily before breakfast.   . metFORMIN (GLUCOPHAGE) 500 MG tablet Take 2 tablets (1,000 mg total) by mouth 2 (two) times daily with a  meal.  . Multiple Vitamin (MULTIVITAMIN) tablet Take 1 tablet by mouth daily.  . ONE TOUCH ULTRA TEST test strip 1 each by Other route as needed for other.   . simvastatin (ZOCOR) 20 MG tablet Take 20 mg by mouth every evening.   . zolpidem (AMBIEN) 10 MG tablet Take 10 mg by mouth at bedtime.   . [DISCONTINUED] metoprolol succinate (TOPROL-XL) 50 MG 24 hr tablet Take 1 tablet (50 mg total) by mouth daily. Take with or immediately following a meal. Do not take if systolic blood pressure is less than 110.     Allergies:   Lipitor [atorvastatin] and Adhesive [tape]   Social History   Socioeconomic History  . Marital status: Married    Spouse name: Not on file  . Number of children: Not on file  . Years of education: Not on file  . Highest education level: Not on file  Occupational History  . Not on file  Tobacco Use  . Smoking status: Former Smoker    Quit date: 03/06/1985    Years since quitting: 34.5  . Smokeless tobacco: Never Used  Substance and Sexual Activity  . Alcohol use: No  . Drug use: No  . Sexual activity: Not Currently  Other Topics Concern  . Not on file  Social History Narrative  . Not on file   Social Determinants of Health   Financial Resource Strain:   . Difficulty of Paying Living Expenses: Not on file  Food Insecurity:   . Worried About Charity fundraiser in the Last Year: Not on file  . Ran Out of Food in the Last Year: Not on file  Transportation Needs:   . Lack of Transportation (Medical): Not on file  . Lack of Transportation (Non-Medical): Not on file  Physical Activity:   . Days of Exercise per Week: Not on file  . Minutes of Exercise per Session: Not on file  Stress:   . Feeling of Stress : Not on file  Social Connections:   . Frequency of Communication with Friends and Family: Not on file  . Frequency of Social Gatherings with Friends and Family: Not on file  . Attends Religious Services: Not on file  . Active Member of Clubs or  Organizations: Not on file  . Attends Archivist Meetings: Not on file  . Marital Status: Not on file     Family History: The patient's family history includes Cancer in his brother; Diabetes in his father, mother, and sister; Heart failure in his mother; Hypertension in his mother.  ROS:   Please  see the history of present illness.    ROS  All other systems reviewed and negative.   EKGs/Labs/Other Studies Reviewed:    The following studies were reviewed today: none  EKG:  EKG is not ordered today.  Recent Labs: 11/14/2018: BUN 26; Creatinine, Ser 1.25; Hemoglobin 14.7; NT-Pro BNP 935; Platelets 182; Potassium 4.6; Sodium 141 06/13/2019: ALT 17   Recent Lipid Panel    Component Value Date/Time   CHOL 127 06/13/2019 0846   TRIG 47 06/13/2019 0846   HDL 42 06/13/2019 0846   CHOLHDL 3.0 06/13/2019 0846   CHOLHDL 2.9 04/12/2018 0216   VLDL 7 04/12/2018 0216   LDLCALC 74 06/13/2019 0846    Physical Exam:    VS:  BP 122/78   Pulse (!) 109   Ht 5\' 11"  (1.803 m)   Wt 157 lb 12.8 oz (71.6 kg)   SpO2 97%   BMI 22.01 kg/m     Wt Readings from Last 3 Encounters:  08/29/19 157 lb 12.8 oz (71.6 kg)  06/06/19 154 lb (69.9 kg)  04/18/19 159 lb 6 oz (72.3 kg)     GEN:  Well nourished, well developed in no acute distress HEENT: Normal NECK: No JVD; No carotid bruits LYMPHATICS: No lymphadenopathy CARDIAC: RRR, no murmurs, rubs, gallops RESPIRATORY:  Clear to auscultation without rales, wheezing or rhonchi  ABDOMEN: Soft, non-tender, non-distended MUSCULOSKELETAL:  No edema; No deformity  SKIN: Warm and dry NEUROLOGIC:  Alert and oriented x 3 PSYCHIATRIC:  Normal affect   ASSESSMENT:    1. Chronic systolic CHF (congestive heart failure) ([place])   2. Coronary artery disease involving native coronary artery of native heart without angina pectoris   3. Essential hypertension   4. Aortic stenosis, severe   5. Dyslipidemia   6. Squamous cell carcinoma of bronchus  in left lower lobe (HCC)   7. DOE (dyspnea on exertion)   8. Atrial tachycardia ([place])    PLAN:    In order of problems listed above:  1.Chronic systolic CHF -His baseline weight 154 - 157 lbs and has been very stable -His echo in 2019 showed moderate to severely reduced LV function with EF 30 to 35% with hypokinesis of the anterior septum and inferior wall. Repeat echo 01/2019 showed EF 20-25% -He has only been taking Lasix 20mg  PRN and usually takes about 2 times weekly -He did not tolerate Entresto due to hypotension -he had been NYHA class 2 but recently has started having DOE and episodes of what sound like PND and orthopnea -this seemed to come on quite abruptly without any significant weight gain -he does not appear volume overloaded on exam -he is tachycardic with ? Atrial tachycardia at 110bpm -following in EP clinic and Dr. Rayann Heman wanted to wait until full extent of CA determined after XRT therapy and patient released from Radiation oncology -recent CT scan showed no evidence of CA but there was some scarring.   -He has followup with Dr. Rayann Heman in march -He will continue on Toprol XL and will increase to 75mg  daily for HR control -he has not required any diuretics recently as his weight has been very stable and he has not felt volume overloaded  2.ASCAD -s/p non-STEMI in 2013 treated with PCI of the LCx/OM. -LHC in 5/14 demonstrated severe 2 vessel CAD with critical in-stent restenosis in the LCx.  -He underwent CABG (SVG-PDA, SVG-LCx). -Repeat left heart cath showed patent SVG to RPDA, SVG to OM1, 50% ostial D1, 40% proximal to mid LAD,  occluded ostial RCA, occluded ostial left circumflex, 40% ostial LAD. -He has not had any anginal symptoms but now is having SOB ? Related to arrhythmia but given degree of SOB I am concerned that this could be worsening CAD -I will get a Lexiscan myoview to rule out ischemia -He will continue on aspirin, beta-blocker and statin  therapy.  3. Hypertension  -BPhas been very well controlled -he has beentolerating Toprol-XL   4.Severe low gradient low outputAS -S/P TAVR 10-1-19with29 mm EdwardsSapein3 THV via transfemoral approach. -2D echo 06/05/2019 showed a stable aortic valve TAVR with trivial perivalvular AR. Mean aortic valve gradient 8 mmHg. -He will continue on ASA 81mg  daily.   5. Hyperlipidemia -his LDL goal is less than 70. -LDL was 60 earlier this month -He will continue on Zocor 40 mg daily.  6. Left lower lung mass  -consistent with invasive squamous cell CA by biopsy. -He is now status post radiationtherapy.  7.  DOE -this started somewhat abruptly -his O2 sats are normal -EKG showed possible atrial tachycardia at 109bpm - given sx came on abruptly this may be etiology of patients DOE -will check 2D echo to make sure no changes in EF and rule out pericardial effusion -he does not appear volume overloaded on exam today -weight is very stable so will not start daily diuretics for now  -check DDimer and BNP  8.  Tachycardia -EKG with ? Atrial tachycardia at 110bpm -discussed with Dr. Lovena Le with EP -will increase Toprol XL to 75mg  daily and get repeat EKG early next week to see if we can determined rhythm after slowing down some -recent TSH was normal -check CBC and BMET   Medication Adjustments/Labs and Tests Ordered: Current medicines are reviewed at length with the patient today.  Concerns regarding medicines are outlined above.  Orders Placed This Encounter  Procedures  . MYOCARDIAL PERFUSION IMAGING - Lexiscan  . EKG 12-Lead  . ECHOCARDIOGRAM COMPLETE   Meds ordered this encounter  Medications  . metoprolol succinate (TOPROL-XL) 25 MG 24 hr tablet    Sig: Take 3 tablets (75 mg total) by mouth daily. Take with or immediately following a meal.    Dispense:  270 tablet    Refill:  3    Dose increased    Signed, Fransico Him, MD  08/29/2019 8:41 PM     [facility]

## 2019-08-30 ENCOUNTER — Telehealth: Payer: Self-pay

## 2019-08-30 ENCOUNTER — Other Ambulatory Visit: Payer: Medicare Other | Admitting: *Deleted

## 2019-08-30 ENCOUNTER — Telehealth: Payer: Self-pay | Admitting: Cardiology

## 2019-08-30 ENCOUNTER — Ambulatory Visit (HOSPITAL_COMMUNITY)
Admission: RE | Admit: 2019-08-30 | Discharge: 2019-08-30 | Disposition: A | Discharge status: Home / Self Care | Payer: Medicare Other | Source: Ambulatory Visit | Attending: Cardiology | Admitting: Cardiology

## 2019-08-30 DIAGNOSIS — R0602 Shortness of breath: Secondary | ICD-10-CM

## 2019-08-30 DIAGNOSIS — R7989 Other specified abnormal findings of blood chemistry: Secondary | ICD-10-CM | POA: Diagnosis not present

## 2019-08-30 DIAGNOSIS — I509 Heart failure, unspecified: Secondary | ICD-10-CM

## 2019-08-30 DIAGNOSIS — M6281 Muscle weakness (generalized): Secondary | ICD-10-CM | POA: Diagnosis not present

## 2019-08-30 DIAGNOSIS — M25511 Pain in right shoulder: Secondary | ICD-10-CM | POA: Diagnosis not present

## 2019-08-30 DIAGNOSIS — M25512 Pain in left shoulder: Secondary | ICD-10-CM | POA: Diagnosis not present

## 2019-08-30 LAB — D-DIMER, QUANTITATIVE: D-DIMER: 1.54 mg/L FEU — ABNORMAL HIGH (ref 0.00–0.49)

## 2019-08-30 MED ORDER — SODIUM CHLORIDE (PF) 0.9 % IJ SOLN
INTRAMUSCULAR | Status: AC
  Filled 2019-08-30: qty 50

## 2019-08-30 MED ORDER — IOHEXOL 350 MG/ML SOLN
100.0000 mL | Freq: Once | INTRAVENOUS | Status: AC | PRN
  Administered 2019-08-30: 100 mL via INTRAVENOUS

## 2019-08-30 NOTE — Telephone Encounter (Signed)
Patient's wife returning call. 

## 2019-08-30 NOTE — Telephone Encounter (Signed)
I spoke to patient's wife and informed her that Dr Radford Pax would like the patient to come in for labs today:  CBC, BMET, STAT D-DIMER and BNP.Marland Kitchen  She will reach out to the patient.

## 2019-08-30 NOTE — Telephone Encounter (Signed)
Patient is going to [facility] for stat CT and creatinine for elevated D. Dimer and SOB.

## 2019-08-30 NOTE — Telephone Encounter (Signed)
-----   Message from [name], MD sent at 08/30/2019  1:06 PM EST ----- DDimer is high and patient has had significant SOB - please get stat Chest CTA PE protocol - he will need a stat BMET first

## 2019-08-30 NOTE — Telephone Encounter (Signed)
-----   Message from [name], MD sent at 08/29/2019  8:42 PM EST ----- Please have patient come in tomorrow 1/15 for CBC, BMET, stat ddimer and BNP  [name]

## 2019-08-30 NOTE — Telephone Encounter (Signed)
   Received outpatient page from [facility] regarding results for Mr. Cotton which were negative for PE.  Patient was seen in the outpatient setting by Dr. Radford Pax for shortness of breath at which time I D-dimer was drawn.  This came back elevated therefore CTA was ordered stat.  As above, this was negative for acute pulmonary embolus.  Spoke with Maudie Mercury at [facility] and confirmed the patient was okay to leave.   Kathyrn Drown NP-C [place] Pager: [phone]

## 2019-08-30 NOTE — Telephone Encounter (Signed)
Called patient's wife about his elevated d-dimer. Placed orders for CT and BMET. Patient has not gotten home yet and wife was unable to get patient on his cell phone. Will send message to [facility] to get CT and BMET scheduled.

## 2019-09-02 LAB — BASIC METABOLIC PANEL
BUN/Creatinine Ratio: 18 (ref 10–24)
BUN: 22 mg/dL (ref 8–27)
CO2: 21 mmol/L (ref 20–29)
Calcium: 9.5 mg/dL (ref 8.6–10.2)
Chloride: 103 mmol/L (ref 96–106)
Creatinine, Ser: 1.24 mg/dL (ref 0.76–1.27)
GFR calc Af Amer: 68 mL/min/{1.73_m2} (ref >59)
GFR calc non Af Amer: 59 mL/min/{1.73_m2} — ABNORMAL LOW (ref >59)
Glucose: 231 mg/dL — ABNORMAL HIGH (ref 65–99)
Potassium: 4.8 mmol/L (ref 3.5–5.2)
Sodium: 141 mmol/L (ref 134–144)

## 2019-09-02 LAB — CBC
Hematocrit: 46 % (ref 37.5–51.0)
Hemoglobin: 14.5 g/dL (ref 13.0–17.7)
MCH: 25.6 pg — ABNORMAL LOW (ref 26.6–33.0)
MCHC: 31.5 g/dL (ref 31.5–35.7)
MCV: 81 fL (ref 79–97)
Platelets: 263 10*3/uL (ref 150–450)
RBC: 5.66 x10E6/uL (ref 4.14–5.80)
RDW: 15.8 % — ABNORMAL HIGH (ref 11.6–15.4)
WBC: 3.5 10*3/uL (ref 3.4–10.8)

## 2019-09-02 LAB — PRO B NATRIURETIC PEPTIDE: NT-Pro BNP: 3888 pg/mL — ABNORMAL HIGH (ref 0–376)

## 2019-09-03 DIAGNOSIS — M25511 Pain in right shoulder: Secondary | ICD-10-CM | POA: Diagnosis not present

## 2019-09-03 DIAGNOSIS — M25512 Pain in left shoulder: Secondary | ICD-10-CM | POA: Diagnosis not present

## 2019-09-03 DIAGNOSIS — M6281 Muscle weakness (generalized): Secondary | ICD-10-CM | POA: Diagnosis not present

## 2019-09-03 NOTE — Addendum Note (Signed)
Addended by: Janan Halter F on: 09/03/2019 09:23 AM   Modules accepted: Orders

## 2019-09-04 ENCOUNTER — Encounter: Payer: Self-pay | Admitting: Physician Assistant

## 2019-09-04 NOTE — Progress Notes (Signed)
Cardiology Office Note    Date:  09/06/2019   ID:  [name], DOB 1951/01/25, MRN [id number]  PCP:  [name], MD  Cardiologist:  Fransico Him, MD  Electrophysiologist:  Thompson Grayer, MD   Chief Complaint: f/u SOB, heart rhythm, CHF  History of Present Illness:   [name] is a 69 y.o. male with complex history of CAD (NSTEMI 2013 s/p PCI of LCx/OM, CABG x2 in 2014), severe aortic stenosis s/p TAVR 19/1478, chronic systolic CHF, atrial tachycardia and sinus tachycardia, lung cancer s/p radiation, OSA, HTN, kidney stones, CVA, GERD, dyslipidemia, DM, CKD III, arthritis, anemia, and mildly dilated aortic root (by echo 05/2019).  Hehad anon-STEMI in 2013 treated with PCI of the LCx/OM. LHC in 5/14 demonstrated severe 2 vessel CAD with critical in-stent restenosis in the LCx. He underwent CABG (SVG-PDA, SVG-LCx). He also has a history of low gradient aortic stenosis with AI and underwent TAVR 05/2018 after being admitted with acute exacerbation of CHF. He has not tolerated Entresto due to hypotension.He saw Dr. Rayann Heman in 02/2019 after repeat 2D echo showed persistently reduced LVF. CRT-P/CRT-D were discussed. The patient was clear that he preferred a conservative approach without further procedures at that time. Dr. Rayann Heman also felt it was important to follow up the extent of CA determined after XRT therapy. Per Dr. Radford Pax, recent CT scan showed no evidence of CA but there was some scarring. Last echocardiogram 06/05/19 showed EF 30%, mild LVH, septal-lateral dyssynchrony consistent with LBBB/IVCD, severe hypokinesis of the septal wall and the inferior wall, trace MR, mild dilation of aortic root, globally reduced RV function, trivial perivalvular AI without significant AS, normal RVSP.   He recently saw Dr. Radford Pax 08/29/19 with increasing SOB/orthopnea for the past 3-4 weeks. He reported stable chronic edema. His EKG showed possible atrial tachycardia around 110bpm. Toprol was  increased to 75mg  daily. Labs personally reviewed include Hgb 14.5, K 4.8, Cr 1.24, BNP 3888, d-dimer 1.54, 05/2019 LDL 74, normal ALT. CT angio was done 08/30/19 for elevated d-dimer - this showed no PE, + moderate bilateral pleural effusions, LLL atelectasis vs pneumonia, + early interstitial edema. He was also started on Lasix 40mg  BID x 3 days with plan for close follow-up (previously PRN). Nuclear stress test and repeat echo were also ordered, still pending at this time.   He returns for follow-up today overall feeling somewhat better. He is somewhat vague on the quantification of this, stating he hasn't been doing much to try and provoke the SOB. He is maybe 50% better with questioning. Edema has resolved. Orthopnea has improved. He took the Lasix BID Tues/Weds/yesterday. He remains mildly tachycardic today at 105bpm but is unaware of any palpitations or elevated HR. He has not had any angina.   Past Medical History:  Diagnosis Date  . Anemia   . Arthritis   . Atrial tachycardia ([place])   . Chronic systolic CHF (congestive heart failure) ([place]) 07/04/2018  . CKD (chronic kidney disease), stage III   . Coronary artery disease 02/2012   a. s/p stenting in 2013  b. s/p CABGx2V (SVG--> PDA, SVG--> LCx).  . Diabetes mellitus    neuropathy  insulin dependent  . Dilated aortic root ([place])   . Dyslipidemia   . Erectile dysfunction   . GERD (gastroesophageal reflux disease)   . History of CVA (cerebrovascular accident)   . History of kidney stones   . Hx of radiation therapy to mediastinum 1985  . Hypertension   . 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 OSA (obstructive sleep apnea) 07/04/2018   Severe obstructive sleep apnea with an AHI of 30/h and mild central sleep apnea at 13.7/h with oxygen desaturations as low as 79%.  Intolerant to PAP therapy  . S/P TAVR (transcatheter aortic valve replacement) 05/15/2018   29 mm Edwards Sapien 3 transcatheter heart valve placed via percutaneous  right transfemoral approach   . Severe aortic stenosis     Past Surgical History:  Procedure Laterality Date  . COLONOSCOPY  07/25/2012   Procedure: COLONOSCOPY;  Surgeon: Winfield Cunas., MD;  Location: [facility] ENDOSCOPY;  Service: Endoscopy;  Laterality: N/A;  . COLONOSCOPY N/A 12/02/2013   Procedure: COLONOSCOPY;  Surgeon: Winfield Cunas., MD;  Location: [facility] ENDOSCOPY;  Service: Endoscopy;  Laterality: N/A;  . CORONARY ARTERY BYPASS GRAFT N/A 01/04/2013   Procedure: CORONARY ARTERY BYPASS GRAFTING (CABG) times two using right saphenous vein harvested with endoscope.;  Surgeon: Ivin Poot, MD;  Location: [place];  Service: Open Heart Surgery;  Laterality: N/A;  . ESOPHAGOGASTRODUODENOSCOPY  07/20/2012   Procedure: ESOPHAGOGASTRODUODENOSCOPY (EGD);  Surgeon: Winfield Cunas., MD;  Location: [facility] ENDOSCOPY;  Service: Endoscopy;  Laterality: N/A;  . ESOPHAGOGASTRODUODENOSCOPY N/A 12/02/2013   Procedure: ESOPHAGOGASTRODUODENOSCOPY (EGD);  Surgeon: Winfield Cunas., MD;  Location: [facility] ENDOS[facility];  Service: Endoscopy;  Laterality: N/A;  . HOT HEMOSTASIS N/A 12/02/2013   Procedure: HOT HEMOSTASIS (ARGON PLASMA COAGULATION/BICAP);  Surgeon: Winfield Cunas., MD;  Location: [facility] ENDOSCOPY;  Service: Endoscopy;  Laterality: N/A;  . INTRAOPERATIVE TRANSESOPHAGEAL ECHOCARDIOGRAM N/A 01/04/2013   Procedure: INTRAOPERATIVE TRANSESOPHAGEAL ECHOCARDIOGRAM;  Surgeon: Ivin Poot, MD;  Location: [place];  Service: Open Heart Surgery;  Laterality: N/A;  . INTRAOPERATIVE TRANSTHORACIC ECHOCARDIOGRAM N/A 05/15/2018   Procedure: INTRAOPERATIVE TRANSTHORACIC ECHOCARDIOGRAM;  Surgeon: Burnell Blanks, MD;  Location: [place];  Service: Open Heart Surgery;  Laterality: N/A;  . IR THORACENTESIS ASP PLEURAL SPACE W/IMG GUIDE  10/19/2018  . LEFT HEART CATHETERIZATION WITH CORONARY ANGIOGRAM N/A 03/06/2012   Procedure: LEFT HEART CATHETERIZATION WITH CORONARY ANGIOGRAM;  Surgeon: [name], MD;  Location: [place]  CATH LAB;  Service: Cardiovascular;  Laterality: N/A;  . LITHOTRIPSY    . [place]  . RADIAL ARTERY HARVEST Left 01/04/2013   Procedure: RADIAL ARTERY HARVEST;  Surgeon: Ivin Poot, MD;  Location: [place];  Service: Vascular;  Laterality: Left;  Artery not havested. Unsuitable for use.  . resection mediastinal seminonma    . RIGHT/LEFT HEART CATH AND CORONARY/GRAFT ANGIOGRAPHY N/A 11/15/2016   Procedure: Right/Left Heart Cath and Coronary/Graft Angiography;  Surgeon: Leonie Man, MD;  Location: [place] CV LAB;  Service: Cardiovascular;  Laterality: N/A;  . RIGHT/LEFT HEART CATH AND CORONARY/GRAFT ANGIOGRAPHY N/A 04/13/2018   Procedure: RIGHT/LEFT HEART CATH AND CORONARY/GRAFT ANGIOGRAPHY;  Surgeon: Jolaine Artist, MD;  Location: [place] CV LAB;  Service: Cardiovascular;  Laterality: N/A;  . TRANSCATHETER AORTIC VALVE REPLACEMENT, TRANSFEMORAL  05/15/2018  . TRANSCATHETER AORTIC VALVE REPLACEMENT, TRANSFEMORAL N/A 05/15/2018   Procedure: TRANSCATHETER AORTIC VALVE REPLACEMENT, TRANSFEMORAL;  Surgeon: Burnell Blanks, MD;  Location: [place];  Service: Open Heart Surgery;  Laterality: N/A;    Current Medications: Current Meds  Medication Sig  . amitriptyline (ELAVIL) 25 MG tablet Take 25 mg by mouth at bedtime.   Marland Kitchen amoxicillin (AMOXIL) 500 MG capsule Take 1 capsule (500 mg total) by mouth as directed. Take 4 tablets (2000 mg ) 1 hour prior to dental procedures.  Marland Kitchen  aspirin 81 MG chewable tablet Chew 1 tablet (81 mg total) by mouth daily.  . B Complex-C (SUPER B COMPLEX PO) Take 1 tablet by mouth daily.  . B-D ULTRAFINE III SHORT PEN 31G X 8 MM MISC 1 each by Other route daily as needed (GLUCOSE TESTING (INSULINE NEEDLE)).   . Cholecalciferol (VITAMIN D-3) 5000 units TABS Take 5,000 Units by mouth daily.  . furosemide (LASIX) 20 MG tablet Take 20 mg by mouth as needed.  . insulin detemir (LEVEMIR) 100 UNIT/ML injection Inject 40 Units into the skin every morning.   . insulin lispro (HUMALOG) 100 UNIT/ML KwikPen as needed.  Marland Kitchen levothyroxine (SYNTHROID, LEVOTHROID) 50 MCG tablet Take 50 mcg by mouth daily before breakfast.   . metFORMIN (GLUCOPHAGE) 500 MG tablet Take 2 tablets (1,000 mg total) by mouth 2 (two) times daily with a meal.  . metoprolol succinate (TOPROL-XL) 25 MG 24 hr tablet Take 3 tablets (75 mg total) by mouth daily. Take with or immediately following a meal.  . Multiple Vitamin (MULTIVITAMIN) tablet Take 1 tablet by mouth daily.  . ONE TOUCH ULTRA TEST test strip 1 each by Other route as needed for other.   . simvastatin (ZOCOR) 20 MG tablet Take 20 mg by mouth every evening.   . zolpidem (AMBIEN) 10 MG tablet Take 10 mg by mouth at bedtime.   . [DISCONTINUED] hydroxypropyl methylcellulose / hypromellose (ISOPTO TEARS / GONIOVISC) 2.5 % ophthalmic solution Place 1 drop into both eyes 4 (four) times daily as needed for dry eyes.    Allergies:   Lipitor [atorvastatin] and Adhesive [tape]   Social History   Socioeconomic History  . Marital status: Married    Spouse name: Not on file  . Number of children: Not on file  . Years of education: Not on file  . Highest education level: Not on file  Occupational History  . Not on file  Tobacco Use  . Smoking status: Former Smoker    Quit date: 03/06/1985    Years since quitting: 34.5  . Smokeless tobacco: Never Used  Substance and Sexual Activity  . Alcohol use: No  . Drug use: No  . Sexual activity: Not Currently  Other Topics Concern  . Not on file  Social History Narrative  . Not on file   Social Determinants of Health   Financial Resource Strain:   . Difficulty of Paying Living Expenses: Not on file  Food Insecurity:   . Worried About Charity fundraiser in the Last Year: Not on file  . Ran Out of Food in the Last Year: Not on file  Transportation Needs:   . Lack of Transportation (Medical): Not on file  . Lack of Transportation (Non-Medical): Not on file  Physical  Activity:   . Days of Exercise per Week: Not on file  . Minutes of Exercise per Session: Not on file  Stress:   . Feeling of Stress : Not on file  Social Connections:   . Frequency of Communication with Friends and Family: Not on file  . Frequency of Social Gatherings with Friends and Family: Not on file  . Attends Religious Services: Not on file  . Active Member of Clubs or Organizations: Not on file  . Attends Archivist Meetings: Not on file  . Marital Status: Not on file     Family History:  The patient's family history includes Cancer in his brother; Diabetes in his father, mother, and sister; Heart failure in his mother;  Hypertension in his mother.  ROS:   Please see the history of present illness. All other systems are reviewed and otherwise negative.    EKGs/Labs/Other Studies Reviewed:    Studies reviewed are outlined and summarized above.  2D echo 06/05/19 IMPRESSIONS    1. Left ventricular ejection fraction, by visual estimation, is 30%. The left ventricle has moderate to severely decreased function. Normal left ventricular size. There is mildly increased left ventricular hypertrophy. Septal-lateral dyssynchrony  consistent with LBBB/IVCD, severe hypokinesis of the septal wall and the inferior wall. The lateral wall appears relatively preserved.  2. Left ventricular diastolic Doppler parameters are consistent with impaired relaxation pattern of LV diastolic filling.  3. There is mild dilatation of the aortic root measuring 40 mm.  4. The tricuspid valve is normal in structure. Tricuspid valve regurgitation was not visualized by color flow Doppler.  5. Moderate calcification of the mitral valve leaflet(s).  6. The mitral valve is degenerative. Trace mitral valve regurgitation. No evidence of mitral stenosis.  7. Global right ventricle has mildly reduced systolic function.The right ventricular size is mildly enlarged. No increase in right ventricular wall  thickness.  8. Bioprosthetic aortic valve s/p TAVR, 29 mm Edwards Sapien. Mean gradient 8 mmHg with AVA 2.25 cm^2, no significant stenosis. Trivial perivalvular leakage noted.  9. Right atrial size was normal. 10. Left atrial size was normal. 11. The inferior vena cava is normal in size with greater than 50% respiratory variability, suggesting right atrial pressure of 3 mmHg. 12. The tricuspid regurgitant velocity is 2.08 m/s, and with an assumed right atrial pressure of 3 mmHg, the estimated right ventricular systolic pressure is normal at 20.3 mmHg.  In comparison to the previous echocardiogram(s): 01/24/19 EF 20-25%. AV 41mmHg mean, 18mmHg peak.    EKG:  EKG is ordered today, personally reviewed, demonstrating atrial tachycardia vs sinus tachycardia 105bpm, LBBB pattern similar to prior, nonspecific STT changes. Dr. Caryl Comes reviewed and did not think this was atrial flutter but cannot discern atrial tach vs sinus tach. Rhythm strip also performed.  Recent Labs: 06/13/2019: ALT 17 08/30/2019: BUN 22; Creatinine, Ser 1.24; Hemoglobin 14.5; NT-Pro BNP 3,888; Platelets 263; Potassium 4.8; Sodium 141  Recent Lipid Panel    Component Value Date/Time   CHOL 127 06/13/2019 0846   TRIG 47 06/13/2019 0846   HDL 42 06/13/2019 0846   CHOLHDL 3.0 06/13/2019 0846   CHOLHDL 2.9 04/12/2018 0216   VLDL 7 04/12/2018 0216   LDLCALC 74 06/13/2019 0846    PHYSICAL EXAM:    VS:  BP 122/78   Pulse (!) 105   Ht 5\' 11"  (1.803 m)   Wt 152 lb 1.9 oz (69 kg)   SpO2 98%   BMI 21.22 kg/m   BMI: Body mass index is 21.22 kg/m.  GEN: Well nourished, well developed thin AAM, in no acute distress HEENT: normocephalic, atraumatic Neck: no JVD, carotid bruits, or masses Cardiac: regular rhythm, mildly tachycardic, possible S3, no murmurs or rubs, no edema  Respiratory: mildly decreased BS at L base, otherwise clear to auscultation bilaterally, normal work of breathing GI: soft, nontender, nondistended, +  BS MS: no deformity or atrophy Skin: warm and dry, no rash Neuro:  Alert and Oriented x 3, Strength and sensation are intact, follows commands Psych: euthymic mood, full affect  Wt Readings from Last 3 Encounters:  09/06/19 152 lb 1.9 oz (69 kg)  08/29/19 157 lb 12.8 oz (71.6 kg)  06/06/19 154 lb (69.9 kg)     ASSESSMENT &  PLAN:   1. Acute on chronic systolic CHF - suspect exacerbated by development of tachycardia, which persists at this time. Interestingly event monitor in 2018 also showed sinus tachycardia with average HR 109bpm. He also carries history of atrial tachycardia. He did well with burst dose of Lasix x 3 days. Weight is down 5lb. Will continue Lasix at 20mg  once daily. Check labs today, BMET and BNP. Will include CHF reminders with instructions (2g sodium restriction, 2L fluid restriction, daily weights). Per d/w Dr. Radford Pax since we are working on heart rate, will postpone echocardiogram and nuclear stress test pending re-assessment of symptoms with HR control. 2. Tachycardia - reviewed EKG with Dr. Radford Pax who requested review with EP Dr. Caryl Comes. Additional rhythm strip obtained. Dr. Caryl Comes feels it is difficult to discern between possible atrial tach vs sinus tachycardia. He suggests addition of Corlanor, will start 5mg  BID. He feels this may help [place] understand better in follow-up how the heart rate responds to differentiate between the two. He also recommends short term monitor to assess HR variation - the shortest duration we currently have available to order is a 3-day. I will also obtain TSH and free T4 with labs today. Will have him f/u in 7-10 days for EKG / HR re-evaluation in the office. Dr. Caryl Comes felt this could be managed by general cards rather than EP for now. 3. CAD s/p prior PCI, CABG - he denies any recent angina. Continue with ASA, BB, statin. Last LDL was marginally above goal. Consider statin titration when more clinically stable (do not want to make acute change so as  not to confuse the picture in case symptoms evolve). Hold off on nuc as above. 4. Essential HTN - blood pressure is normal. Continue to monitor for now. 5. CKD stage III - will recheck BMET today and follow creatinine with diuresis.  Disposition: F/u with Dr. Radford Pax or APP in 7-10 days.  Medication Adjustments/Labs and Tests Ordered: Current medicines are reviewed at length with the patient today.  Concerns regarding medicines are outlined above. Medication changes, Labs and Tests ordered today are summarized above and listed in the Patient Instructions accessible in Encounters.   Signed, Charlie Pitter, PA-C  09/06/2019 9:41 AM    [address], [place], [place]  [address] Phone: [phone]; Fax: [phone]

## 2019-09-05 ENCOUNTER — Ambulatory Visit: Payer: Medicare Other

## 2019-09-06 ENCOUNTER — Telehealth: Payer: Self-pay | Admitting: Radiology

## 2019-09-06 ENCOUNTER — Ambulatory Visit (INDEPENDENT_AMBULATORY_CARE_PROVIDER_SITE_OTHER): Payer: Medicare Other | Admitting: Physician Assistant

## 2019-09-06 ENCOUNTER — Other Ambulatory Visit: Payer: Self-pay

## 2019-09-06 ENCOUNTER — Encounter: Payer: Self-pay | Admitting: Physician Assistant

## 2019-09-06 VITALS — BP 122/78 | HR 105 | Ht 71.0 in | Wt 152.1 lb

## 2019-09-06 DIAGNOSIS — I10 Essential (primary) hypertension: Secondary | ICD-10-CM | POA: Diagnosis not present

## 2019-09-06 DIAGNOSIS — N183 Chronic kidney disease, stage 3 unspecified: Secondary | ICD-10-CM

## 2019-09-06 DIAGNOSIS — I5023 Acute on chronic systolic (congestive) heart failure: Secondary | ICD-10-CM

## 2019-09-06 DIAGNOSIS — I471 Supraventricular tachycardia: Secondary | ICD-10-CM | POA: Diagnosis not present

## 2019-09-06 DIAGNOSIS — I2510 Atherosclerotic heart disease of native coronary artery without angina pectoris: Secondary | ICD-10-CM | POA: Diagnosis not present

## 2019-09-06 DIAGNOSIS — I4719 Other supraventricular tachycardia: Secondary | ICD-10-CM

## 2019-09-06 MED ORDER — IVABRADINE HCL 5 MG PO TABS: 5.0000 mg | ORAL_TABLET | Freq: Two times a day (BID) | ORAL | 3 refills | Status: DC

## 2019-09-06 NOTE — Telephone Encounter (Signed)
Enrolled patient for a 3 day Zio monitor to be mailed to patients home.  

## 2019-09-06 NOTE — Patient Instructions (Addendum)
Medication Instructions:  Your physician has recommended you make the following change in your medication:  1.  START Corlanor 5 mg taking 1 tablet twice a day 2.  CONTINUE the Lasix 20 mg taking 1 daily  *If you need a refill on your cardiac medications before your next appointment, please call your pharmacy*  Lab Work: TODAY:  BMET, MAG, TSH, FREE T4, & PRO BNP  If you have labs (blood work) drawn today and your tests are completely normal, you will receive your results only by: Marland Kitchen MyChart Message (if you have MyChart) OR . A paper copy in the mail If you have any lab test that is abnormal or we need to change your treatment, we will call you to review the results.  Testing/Procedures:  [facility]- Long Term Monitor Instructions   Your physician has requested you wear your ZIO patch monitor 3 days.   This is a single patch monitor.  [employer] supplies one patch monitor per enrollment.  Additional stickers are not available.   Please do not apply patch if you will be having a Nuclear Stress Test, Echocardiogram, Cardiac CT, MRI, or Chest Xray during the time frame you would be wearing the monitor. The patch cannot be worn during these tests.  You cannot remove and re-apply the ZIO XT patch monitor.   Your ZIO patch monitor will be sent USPS Priority mail from [facility] directly to your home address. The monitor may also be mailed to a PO BOX if home delivery is not available.   It may take 3-5 days to receive your monitor after you have been enrolled.   Once you have received you monitor, please review enclosed instructions.  Your monitor has already been registered assigning a specific monitor serial # to you.   Applying the monitor   Shave hair from upper left chest.   Hold abrader disc by orange tab.  Rub abrader in 40 strokes over left upper chest as indicated in your monitor instructions.   Clean area with 4 enclosed alcohol pads .  Use all pads to assure are is cleaned  thoroughly.  Let dry.   Apply patch as indicated in monitor instructions.  Patch will be place under collarbone on left side of chest with arrow pointing upward.   Rub patch adhesive wings for 2 minutes.Remove white label marked "1".  Remove white label marked "2".  Rub patch adhesive wings for 2 additional minutes.   While looking in a mirror, press and release button in center of patch.  A small green light will flash 3-4 times .  This will be your only indicator the monitor has been turned on.     Do not shower for the first 24 hours.  You may shower after the first 24 hours.   Press button if you feel a symptom. You will hear a small click.  Record Date, Time and Symptom in the Patient Log Book.   When you are ready to remove patch, follow instructions on last 2 pages of Patient Log Book.  Stick patch monitor onto last page of Patient Log Book.   Place Patient Log Book in [place] box.  Use locking tab on box and tape box closed securely.  The [employer] and [employer] has [employer] on it.  Please place in mailbox as soon as possible.  Your physician should have your test results approximately 7 days after the monitor has been mailed back to [facility].   Call [employer]  Customer Care at [phone] if you have questions regarding your ZIO XT patch monitor.  Call them immediately if you see an orange light blinking on your monitor.   If your monitor falls off in less than 4 days contact our Monitor department at [phone].  If your monitor becomes loose or falls off after 4 days call [employer] at [phone] for suggestions on securing your monitor.    Follow-Up: At [facility], you and your health needs are our priority.  As part of our continuing mission to provide you with exceptional heart care, we have created designated Provider Care Teams.  These Care Teams include your primary Cardiologist (physician) and Advanced Practice Providers (APPs -  Physician Assistants and  Nurse Practitioners) who all work together to provide you with the care you need, when you need it.  Your next appointment:   09/18/2019 ARRIVE AT 11:25   The format for your next appointment:   In Person  Provider:   Fransico Him, MD  Other Instructions For patients with congestive heart failure, we give them these special instructions:  1. Follow a low-salt diet - you are allowed no more than 2,000mg  of sodium per day. Watch your fluid intake. In general, you should not be taking in more than 2 liters of fluid per day (no more than 8 glasses per day). This includes sources of water in foods like soup, coffee, tea, milk, etc. 2. Weigh yourself on the same scale at same time of day and keep a log. 3. Call your doctor: (Anytime you feel any of the following symptoms)  - 3lb weight gain overnight or 5lb within a few days - Shortness of breath, with or without a dry hacking cough  - Swelling in the hands, feet or stomach  - If you have to sleep on extra pillows at night in order to breathe   IT IS IMPORTANT TO LET YOUR DOCTOR KNOW EARLY ON IF YOU ARE HAVING SYMPTOMS SO WE CAN HELP YOU!

## 2019-09-07 LAB — BASIC METABOLIC PANEL
BUN/Creatinine Ratio: 18 (ref 10–24)
BUN: 23 mg/dL (ref 8–27)
CO2: 22 mmol/L (ref 20–29)
Calcium: 9.6 mg/dL (ref 8.6–10.2)
Chloride: 97 mmol/L (ref 96–106)
Creatinine, Ser: 1.31 mg/dL — ABNORMAL HIGH (ref 0.76–1.27)
GFR calc Af Amer: 63 mL/min/{1.73_m2} (ref >59)
GFR calc non Af Amer: 55 mL/min/{1.73_m2} — ABNORMAL LOW (ref >59)
Glucose: 300 mg/dL — ABNORMAL HIGH (ref 65–99)
Potassium: 4.4 mmol/L (ref 3.5–5.2)
Sodium: 136 mmol/L (ref 134–144)

## 2019-09-07 LAB — TSH: TSH: 5.66 u[IU]/mL — ABNORMAL HIGH (ref 0.450–4.500)

## 2019-09-07 LAB — PRO B NATRIURETIC PEPTIDE: NT-Pro BNP: 2117 pg/mL — ABNORMAL HIGH (ref 0–376)

## 2019-09-07 LAB — MAGNESIUM: Magnesium: 2.1 mg/dL (ref 1.6–2.3)

## 2019-09-07 LAB — T4, FREE: Free T4: 1.56 ng/dL (ref 0.82–1.77)

## 2019-09-09 ENCOUNTER — Telehealth: Payer: Self-pay

## 2019-09-09 NOTE — Telephone Encounter (Signed)
-----   Message from Jeanann Lewandowsky, [place] sent at 09/09/2019 11:23 AM EST ----- I am in [facility] covering today.  Can you call pt please since he may need samples?  Just be easier for me and pt. Thanks Legrand Como!

## 2019-09-09 NOTE — Telephone Encounter (Signed)
-----   Message from Jeanann Lewandowsky, [place] sent at 09/09/2019 11:23 AM EST ----- I am in [facility] covering today.  Can you call pt please since he may need samples?  Just be easier for me and pt. Thanks Legrand Como!

## 2019-09-09 NOTE — Telephone Encounter (Signed)
I spoke to the patient's wife and told her that we could supply samples until the pre-authorization is complete.  She verbalized understanding and will come by to pick up.

## 2019-09-09 NOTE — Telephone Encounter (Signed)
The patient has been notified of the lab results and verbalized understanding.  All questions (if any) were answered. Frederik Schmidt, RN 09/09/2019 11:45 AM

## 2019-09-09 NOTE — Telephone Encounter (Signed)
**Note De-Identified Shonika Kolasinski Obfuscation** I called [facility] and did a Corlanor PA over the phone with Tobe Sos. Per Tobe Sos this request is now pending pharmacist review and that they will fax [place] an approval or denial letter as soon as a decision has been made. [id number]

## 2019-09-10 ENCOUNTER — Ambulatory Visit (INDEPENDENT_AMBULATORY_CARE_PROVIDER_SITE_OTHER): Payer: Medicare Other

## 2019-09-10 DIAGNOSIS — N183 Chronic kidney disease, stage 3 unspecified: Secondary | ICD-10-CM

## 2019-09-10 DIAGNOSIS — I471 Supraventricular tachycardia: Secondary | ICD-10-CM | POA: Diagnosis not present

## 2019-09-10 DIAGNOSIS — I2510 Atherosclerotic heart disease of native coronary artery without angina pectoris: Secondary | ICD-10-CM | POA: Diagnosis not present

## 2019-09-10 DIAGNOSIS — M25512 Pain in left shoulder: Secondary | ICD-10-CM | POA: Diagnosis not present

## 2019-09-10 DIAGNOSIS — I10 Essential (primary) hypertension: Secondary | ICD-10-CM

## 2019-09-10 DIAGNOSIS — I5023 Acute on chronic systolic (congestive) heart failure: Secondary | ICD-10-CM | POA: Diagnosis not present

## 2019-09-10 DIAGNOSIS — M25511 Pain in right shoulder: Secondary | ICD-10-CM | POA: Diagnosis not present

## 2019-09-10 DIAGNOSIS — M6281 Muscle weakness (generalized): Secondary | ICD-10-CM | POA: Diagnosis not present

## 2019-09-12 NOTE — Telephone Encounter (Signed)
Yes an appeals can be submitted when prior authorization is denied. Will route to [facility] for follow up with this. Pt does have heart failure with reduced ejection fraction and HR > 100 even on beta blocker therapy - would submit under heart failure as indication since it aligns with FDA approval and sinus tach indication does not (this is off-label).

## 2019-09-12 NOTE — Telephone Encounter (Signed)
Will route to [facility]

## 2019-09-12 NOTE — Telephone Encounter (Signed)
**Note De-Identified Solomiya Pascale Obfuscation** Letter received from [employer] stating that they have denied coverage of Corlanor. Reason for denial: Medicare allows [place] to cover a drug only when it is a PART D drug. PART D is one that is used for a medically accepted indication.  FYI When I did this PA over the phone with Tobe Sos I did advise of the following that was noted per Dr Caryl Comes at the pts 09/06/19 OV with Melina Copa, PA-c: 1. Tachycardia - reviewed EKG with Dr. Radford Pax who requested review with EP Dr. Caryl Comes. Additional rhythm strip obtained. Dr. Caryl Comes feels it is difficult to discern between possible atrial tach vs sinus tachycardia. He suggests addition of Corlanor, will start 5mg  BID. He feels this may help [place] understand better in follow-up how the heart rate responds to differentiate between the two.  Will forward this note to Melina Copa, PA-c, Dr Radford Pax and their nurses for advisement to the pt.

## 2019-09-12 NOTE — Telephone Encounter (Signed)
Are there options for repeal? Or options in the HF clinic?

## 2019-09-12 NOTE — Telephone Encounter (Signed)
Dayna can you check with [employer] [place] to see if we have any recourse.  Do we have enough samples?

## 2019-09-13 ENCOUNTER — Encounter (HOSPITAL_COMMUNITY): Payer: Medicare Other

## 2019-09-13 ENCOUNTER — Other Ambulatory Visit (HOSPITAL_COMMUNITY): Payer: Medicare Other

## 2019-09-13 DIAGNOSIS — M6281 Muscle weakness (generalized): Secondary | ICD-10-CM | POA: Diagnosis not present

## 2019-09-13 DIAGNOSIS — M25512 Pain in left shoulder: Secondary | ICD-10-CM | POA: Diagnosis not present

## 2019-09-13 DIAGNOSIS — M25511 Pain in right shoulder: Secondary | ICD-10-CM | POA: Diagnosis not present

## 2019-09-13 NOTE — Telephone Encounter (Signed)
**Note De-Identified Freddi Forster Obfuscation** I called [employer] and did an urgent appeal over the phone with Judeen Hammans. I answered all questions for this Corlanor appeal using [facility] notes as reference which included primary DX of ICD 10 code N88.71 Chronic systolic CHF.  Per Judeen Hammans this appeal may require my signature and if so a form will be faxed to the office. Judeen Hammans also states that there is a 35 hour turn around time for their decision   Reference#: [id number]

## 2019-09-17 DIAGNOSIS — M6281 Muscle weakness (generalized): Secondary | ICD-10-CM | POA: Diagnosis not present

## 2019-09-17 DIAGNOSIS — M25511 Pain in right shoulder: Secondary | ICD-10-CM | POA: Diagnosis not present

## 2019-09-17 DIAGNOSIS — M25512 Pain in left shoulder: Secondary | ICD-10-CM | POA: Diagnosis not present

## 2019-09-17 NOTE — Progress Notes (Signed)
Cardiology Office Note:    Date:  09/18/2019   ID:  [name], DOB 1950/12/15, MRN [id number]  PCP:  [name], MD  Cardiologist:  Fransico Him, MD    Referring MD: [name], MD   Chief Complaint  Patient presents with  . Coronary Artery Disease  . Aortic Stenosis  . Congestive Heart Failure    History of Present Illness:    ODUS CLASBY is a 69 y.o. male with a hx of CAD, aortic stenosis and systolic HF. Hehad anon-STEMI in 2013 treated with PCI of the LCx/OM. LHC in 5/14 demonstrated severe 2 vessel CAD with critical in-stent restenosis in the LCx. He underwent CABG (SVG-PDA, SVG-LCx). He also has a history of aortic stenosis. Echo 10/2016 showed moderately reduced LVF at 35-40% with diffuse HK, mild AS and moderate AR. Right and Left heart cath showed mild to moderate AS with AVA 0.837cm2 and EF 45% with patent SVG to RPDA and SVG to OM1 With occluded native vessels. He ultimately was diagnosed with low gradient aortic stenosis with AI and underwent TAVR with a 29 mm EdwardsSapein3 THV via transfemoral approach after being admitted with acute exacerbation of CHF on 05/15/2018.  His baseline weightin the past had been around 162lbs butover the past few months has been running about 154lbs. He has not tolerated Entresto due to hypotension. He has been on low dose BB. He only take Lasix PRN for weight >165lbs and has only been taking recently about twice weekly. He recently saw Dr. Rayann Heman after repeat 2D echo showed persistently reduced LVF with EF 20-25%. Due to active lung Ca he was felt not to be a candidate for ICD but Dr. Rayann Heman felt he was a candidate for CRT-D but patient did not want to pursue at that time.  I saw him last month and he was complaining of new onset SOB that started abruptly.  He had no associated CP.  His weight and LE edema were stable.  He was also having PND.  He did not appear volume overloaded on exam.  BNp was done and was  elevated at 3888.  He was also found to have SVT which was unclear whether sinus in origin or atrial tach.  He was already on Toprol which was increased to 75mg  daily.  2D echo and lexiscan myoview were ordered but cancelled on followup study as HR was still not under control and wanted to HR controlled better first.  A ddimer was elevated and CHest CTA was neg for PE but showed LLL atelectasis vs. PNA with early interstitial edema.  He was started on Lasix 40mg  BID for 3 days.  He was seen back by Melina Copa, PA and EKG showed persistence of SVT.  He was started on Corlanor.  It was felt that possibly his CHF was exacerbated by his tachycardia. His weight was down 5lbs at that OV and his Lasix was decreased to 20mg  daily with 2gm Na diet and 2L fluid restriction.    He is back today for followup and is feeling much better.  His SOB has essentially resolved and he has no CP.  His HR is now in the 80's.  He denies any PND, orthopnea,dizziness, palpitations or syncope.  He has some mild intermittent LE edema which is very mild.  He says he feels much better.    Past Medical History:  Diagnosis Date  . Anemia   . Arthritis   . Atrial tachycardia ([place])   . Chronic  systolic CHF (congestive heart failure) ([place]) 07/04/2018  . CKD (chronic kidney disease), stage III   . Coronary artery disease 02/2012   a. s/p stenting in 2013  b. s/p CABGx2V (SVG--> PDA, SVG--> LCx).  . Diabetes mellitus    neuropathy  insulin dependent  . Dilated aortic root ([place])   . Dyslipidemia   . Erectile dysfunction   . GERD (gastroesophageal reflux disease)   . History of CVA (cerebrovascular accident)   . History of kidney stones   . Hx of radiation therapy to mediastinum 1985  . Hypertension   . 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 OSA (obstructive sleep apnea) 07/04/2018   Severe obstructive sleep apnea with an AHI of 30/h and mild central sleep apnea at 13.7/h with oxygen desaturations as low as 79%.   Intolerant to PAP therapy  . S/P TAVR (transcatheter aortic valve replacement) 05/15/2018   29 mm Edwards Sapien 3 transcatheter heart valve placed via percutaneous right transfemoral approach   . Severe aortic stenosis     Past Surgical History:  Procedure Laterality Date  . COLONOSCOPY  07/25/2012   Procedure: COLONOSCOPY;  Surgeon: Winfield Cunas., MD;  Location: [facility] ENDOSCOPY;  Service: Endoscopy;  Laterality: N/A;  . COLONOSCOPY N/A 12/02/2013   Procedure: COLONOSCOPY;  Surgeon: Winfield Cunas., MD;  Location: [facility] ENDOSCOPY;  Service: Endoscopy;  Laterality: N/A;  . CORONARY ARTERY BYPASS GRAFT N/A 01/04/2013   Procedure: CORONARY ARTERY BYPASS GRAFTING (CABG) times two using right saphenous vein harvested with endoscope.;  Surgeon: Ivin Poot, MD;  Location: [place];  Service: Open Heart Surgery;  Laterality: N/A;  . ESOPHAGOGASTRODUODENOSCOPY  07/20/2012   Procedure: ESOPHAGOGASTRODUODENOSCOPY (EGD);  Surgeon: Winfield Cunas., MD;  Location[facility] ENDOSCOPY;  Service: Endoscopy;  Laterality: N/A;  . ESOPHAGOGASTRODUODENOSCOPY N/A 12/02/2013   Procedure: ESOPHAGOGASTRODUODENOSCOPY (EGD);  Surgeon: Winfield Cunas., MD;  Location: [facility] ENDOSCOPY;  Service: Endoscopy;  Laterality: N/A;  . HOT HEMOSTASIS N/A 12/02/2013   Procedure: HOT HEMOSTASIS (ARGON PLASMA COAGULATION/BICAP);  Surgeon: Winfield Cunas., MD;  Location: [facility] ENDOSCOPY;  Service: Endoscopy;  Laterality: N/A;  . INTRAOPERATIVE TRANSESOPHAGEAL ECHOCARDIOGRAM N/A 01/04/2013   Procedure: INTRAOPERATIVE TRANSESOPHAGEAL ECHOCARDIOGRAM;  Surgeon: Ivin Poot, MD;  Location: [place];  Service: Open Heart Surgery;  Laterality: N/A;  . INTRAOPERATIVE TRANSTHORACIC ECHOCARDIOGRAM N/A 05/15/2018   Procedure: INTRAOPERATIVE TRANSTHORACIC ECHOCARDIOGRAM;  Surgeon: Burnell Blanks, MD;  Location: [place];  Service: Open Heart Surgery;  Laterality: N/A;  . IR THORACENTESIS ASP PLEURAL SPACE W/IMG GUIDE  10/19/2018  . LEFT  HEART CATHETERIZATION WITH CORONARY ANGIOGRAM N/A 03/06/2012   Procedure: LEFT HEART CATHETERIZATION WITH CORONARY ANGIOGRAM;  Surgeon: [name], MD;  Location: [place] CATH LAB;  Service: Cardiovascular;  Laterality: N/A;  . LITHOTRIPSY    . [place]  . RADIAL ARTERY HARVEST Left 01/04/2013   Procedure: RADIAL ARTERY HARVEST;  Surgeon: Ivin Poot, MD;  Location: [place];  Service: Vascular;  Laterality: Left;  Artery not havested. Unsuitable for use.  . resection mediastinal seminonma    . RIGHT/LEFT HEART CATH AND CORONARY/GRAFT ANGIOGRAPHY N/A 11/15/2016   Procedure: Right/Left Heart Cath and Coronary/Graft Angiography;  Surgeon: Leonie Man, MD;  Location: [place] CV LAB;  Service: Cardiovascular;  Laterality: N/A;  . RIGHT/LEFT HEART CATH AND CORONARY/GRAFT ANGIOGRAPHY N/A 04/13/2018   Procedure: RIGHT/LEFT HEART CATH AND CORONARY/GRAFT ANGIOGRAPHY;  Surgeon: Jolaine Artist, MD;  Location: [place] CV LAB;  Service:  Cardiovascular;  Laterality: N/A;  . TRANSCATHETER AORTIC VALVE REPLACEMENT, TRANSFEMORAL  05/15/2018  . TRANSCATHETER AORTIC VALVE REPLACEMENT, TRANSFEMORAL N/A 05/15/2018   Procedure: TRANSCATHETER AORTIC VALVE REPLACEMENT, TRANSFEMORAL;  Surgeon: Burnell Blanks, MD;  Location: [place];  Service: Open Heart Surgery;  Laterality: N/A;    Current Medications: Current Meds  Medication Sig  . amitriptyline (ELAVIL) 25 MG tablet Take 25 mg by mouth at bedtime.   Marland Kitchen amoxicillin (AMOXIL) 500 MG capsule Take 1 capsule (500 mg total) by mouth as directed. Take 4 tablets (2000 mg ) 1 hour prior to dental procedures.  Marland Kitchen aspirin 81 MG chewable tablet Chew 1 tablet (81 mg total) by mouth daily.  . B Complex-C (SUPER B COMPLEX PO) Take 1 tablet by mouth daily.  . B-D ULTRAFINE III SHORT PEN 31G X 8 MM MISC 1 each by Other route daily as needed (GLUCOSE TESTING (INSULINE NEEDLE)).   . Cholecalciferol (VITAMIN D-3) 5000 units TABS Take 5,000 Units by mouth  daily.  . furosemide (LASIX) 20 MG tablet Take 20 mg by mouth daily.   . insulin detemir (LEVEMIR) 100 UNIT/ML injection Inject 40 Units into the skin every morning.  . insulin lispro (HUMALOG) 100 UNIT/ML KwikPen as needed.  . ivabradine (CORLANOR) 5 MG TABS tablet Take 1 tablet (5 mg total) by mouth 2 (two) times daily with a meal.  . levothyroxine (SYNTHROID, LEVOTHROID) 50 MCG tablet Take 50 mcg by mouth daily before breakfast.   . metFORMIN (GLUCOPHAGE) 500 MG tablet Take 2 tablets (1,000 mg total) by mouth 2 (two) times daily with a meal.  . metoprolol succinate (TOPROL-XL) 25 MG 24 hr tablet Take 3 tablets (75 mg total) by mouth daily. Take with or immediately following a meal.  . Multiple Vitamin (MULTIVITAMIN) tablet Take 1 tablet by mouth daily.  . ONE TOUCH ULTRA TEST test strip 1 each by Other route as needed for other.   . simvastatin (ZOCOR) 20 MG tablet Take 20 mg by mouth every evening.   . zolpidem (AMBIEN) 10 MG tablet Take 10 mg by mouth at bedtime.      Allergies:   Lipitor [atorvastatin] and Adhesive [tape]   Social History   Socioeconomic History  . Marital status: Married    Spouse name: Not on file  . Number of children: Not on file  . Years of education: Not on file  . Highest education [name]: Not on file  Occupational History  . Not on file  Tobacco Use  . Smoking status: Former Smoker    Quit date: 03/06/1985    Years since quitting: 34.5  . Smokeless tobacco: Never Used  Substance and Sexual Activity  . Alcohol use: No  . Drug use: No  . Sexual activity: Not Currently  Other Topics Concern  . Not on file  Social History Narrative  . Not on file   Social Determinants of Health   Financial Resource Strain:   . Difficulty of Paying Living Expenses: Not on file  Food Insecurity:   . Worried About Charity fundraiser in the Last Year: Not on file  . Ran Out of Food in the Last Year: Not on file  Transportation Needs:   . Lack of Transportation  (Medical): Not on file  . Lack of Transportation (Non-Medical): Not on file  Physical Activity:   . Days of Exercise per Week: Not on file  . Minutes of Exercise per Session: Not on file  Stress:   . Feeling of  Stress : Not on file  Social Connections:   . Frequency of Communication with Friends and Family: Not on file  . Frequency of Social Gatherings with Friends and Family: Not on file  . Attends Religious Services: Not on file  . Active Member of Clubs or Organizations: Not on file  . Attends Archivist Meetings: Not on file  . Marital Status: Not on file     Family History: The patient's family history includes Cancer in his brother; Diabetes in his father, mother, and sister; Heart failure in his mother; Hypertension in his mother.  ROS:   Please see the history of present illness.    ROS  All other systems reviewed and negative.   EKGs/Labs/Other Studies Reviewed:    The following studies were reviewed today: Labs, 2D echo, EKG  EKG:  EKG is  ordered today.  The ekg ordered today demonstrates NSR at 92bpm with nonspecific IVCD and repol abnormality  Recent Labs: 06/13/2019: ALT 17 08/30/2019: Hemoglobin 14.5; Platelets 263 09/06/2019: BUN 23; Creatinine, Ser 1.31; Magnesium 2.1; NT-Pro BNP 2,117; Potassium 4.4; Sodium 136; TSH 5.660   Recent Lipid Panel    Component Value Date/Time   CHOL 127 06/13/2019 0846   TRIG 47 06/13/2019 0846   HDL 42 06/13/2019 0846   CHOLHDL 3.0 06/13/2019 0846   CHOLHDL 2.9 04/12/2018 0216   VLDL 7 04/12/2018 0216   LDLCALC 74 06/13/2019 0846    Physical Exam:    VS:  BP 122/64   Pulse 92   Ht 5\' 11"  (1.803 m)   Wt 156 lb 3.2 oz (70.9 kg)   BMI 21.79 kg/m     Wt Readings from Last 3 Encounters:  09/18/19 156 lb 3.2 oz (70.9 kg)  09/06/19 152 lb 1.9 oz (69 kg)  08/29/19 157 lb 12.8 oz (71.6 kg)     GEN:  Well nourished, well developed in no acute distress HEENT: Normal NECK: No JVD; No carotid  bruits LYMPHATICS: No lymphadenopathy CARDIAC: RRR, no murmurs, rubs, gallops RESPIRATORY:  Clear to auscultation without rales, wheezing or rhonchi  ABDOMEN: Soft, non-tender, non-distended MUSCULOSKELETAL:  No edema; No deformity  SKIN: Warm and dry NEUROLOGIC:  Alert and oriented x 3 PSYCHIATRIC:  Normal affect   ASSESSMENT:    1. Chronic systolic CHF (congestive heart failure) ([place])   2. Coronary artery disease due to lipid rich plaque   3. Essential hypertension   4. Aortic stenosis, severe   5. Dyslipidemia   6. Squamous cell carcinoma of bronchus in left lower lobe (H[facility])   7. SOB (shortness of breath)   8. Atrial tachycardia ([place])    PLAN:    In order of problems listed above: 1.Chronic systolic CHF -His baseline weight154 - 157 lbs and has been very stable -His echo in 2019 showed moderate to severely reduced LV function with EF 30 to 35% with hypokinesis of the anterior septum and inferior wall. Repeat echo 01/2019 showed EF 20-25% -He did not tolerate Entresto due to hypotension -he had been NYHA class 2 but recently has started having DOE and episodes of what sound like PND and orthopnea -this seemed to come on quite abruptly without any significant weight gain -BNP was elevated at 3300 and Chest CT neg for PE but showed interstitial edema -given Lasix 40mg  BID for several days and now is on Lasix 20mg  daily -his sx have completely resolved on Lasix - denies any SOB and LE edema is well controlled -suspect that tachyarrhythmia may  have resulted in CHF exacerbation -will continue on Lasix 20mg  daily -continue Toprol XL 75mg  dialy and Corlanor 5mg  BID -he has not tolerated Entresto in the past -following in EP clinic and Dr. Rayann Heman wanted to wait until full extent of CA determined after XRT therapy and patient released from Radiation oncology -the patient states that he is cancer free -he has a wide complex QRS in setting of LV dysfunction and I think he would  benefit from CRT-AICD and I will get him in to see Dr. Rayann Heman in the next few weeks  2.ASCAD -s/p non-STEMI in 2013 treated with PCI of the LCx/OM. -LHC in 5/14 demonstrated severe 2 vessel CAD with critical in-stent restenosis in the LCx.  -He underwent CABG (SVG-PDA, SVG-LCx). -Repeat left heart cath showed patent SVG to RPDA, SVG to OM1, 50% ostial D1, 40% proximal to mid LAD, occluded ostial RCA, occluded ostial left circumflex, 40% ostial LAD. -He has not had any anginal symptoms  -Since SOB resolved after diuresis will not pursue Lexiscan myoview at this time -Continue ASA, BB and statin  3. Hypertension  -BP well controlled -continue Toprol XL 75mg  daily  4.Severe low gradient low outputAS -S/P TAVR 10-1-19with29 mm EdwardsSapein3 THV via transfemoral approach. -2D echo10/21/2020showed a stable aortic valve TAVR withtrivialperivalvular AR. Mean aortic valve gradient63mmHg. -He will continue on ASA 81mg  daily.   5. Hyperlipidemia -his LDL goal is less than 70. -LDL was 60 last month -He will continue on Zocor 40 mg daily.  6. Left lower lung mass  -consistent with invasive squamous cell CA by biopsy. -He is now status post radiationtherapy.  7.  DOE -secondary to acute systolic CHF -DOE resolved after diuresis and getting HR under control -his O2 sats are normal -will hold off on repeat echo since his SOB has resolved -he does not appear volume overloaded on exam today  8.  Tachycardia -EKG initially with ? Atrial tachycardia at 110bpm -Now in NSR on Corlanor 5mg  BID -continue Toprol XL to 75mg  daily -recent TSH was normal    Medication Adjustments/Labs and Tests Ordered: Current medicines are reviewed at length with the patient today.  Concerns regarding medicines are outlined above.  Orders Placed This Encounter  Procedures  . EKG 12-Lead   No orders of the defined types were placed in this encounter.   Signed, Fransico Him, MD  09/18/2019 6:11 PM    [place]

## 2019-09-18 ENCOUNTER — Ambulatory Visit (INDEPENDENT_AMBULATORY_CARE_PROVIDER_SITE_OTHER): Payer: Medicare Other | Admitting: Cardiology

## 2019-09-18 ENCOUNTER — Other Ambulatory Visit: Payer: Self-pay

## 2019-09-18 ENCOUNTER — Encounter: Payer: Self-pay | Admitting: Cardiology

## 2019-09-18 VITALS — BP 122/64 | HR 92 | Ht 71.0 in | Wt 156.2 lb

## 2019-09-18 DIAGNOSIS — I10 Essential (primary) hypertension: Secondary | ICD-10-CM | POA: Diagnosis not present

## 2019-09-18 DIAGNOSIS — I2583 Coronary atherosclerosis due to lipid rich plaque: Secondary | ICD-10-CM

## 2019-09-18 DIAGNOSIS — I2510 Atherosclerotic heart disease of native coronary artery without angina pectoris: Secondary | ICD-10-CM | POA: Diagnosis not present

## 2019-09-18 DIAGNOSIS — E785 Hyperlipidemia, unspecified: Secondary | ICD-10-CM | POA: Diagnosis not present

## 2019-09-18 DIAGNOSIS — R0602 Shortness of breath: Secondary | ICD-10-CM | POA: Diagnosis not present

## 2019-09-18 DIAGNOSIS — I350 Nonrheumatic aortic (valve) stenosis: Secondary | ICD-10-CM | POA: Diagnosis not present

## 2019-09-18 DIAGNOSIS — C3432 Malignant neoplasm of lower lobe, left bronchus or lung: Secondary | ICD-10-CM

## 2019-09-18 DIAGNOSIS — I5022 Chronic systolic (congestive) heart failure: Secondary | ICD-10-CM

## 2019-09-18 DIAGNOSIS — I471 Supraventricular tachycardia: Secondary | ICD-10-CM | POA: Diagnosis not present

## 2019-09-18 NOTE — Patient Instructions (Addendum)
Medication Instructions:  Your physician recommends that you continue on your current medications as directed. Please refer to the Current Medication list given to you today.  *If you need a refill on your cardiac medications before your next appointment, please call your pharmacy*  Follow-Up: At [facility], you and your health needs are our priority.  As part of our continuing mission to provide you with exceptional heart care, we have created designated Provider Care Teams.  These Care Teams include your primary Cardiologist (physician) and Advanced Practice Providers (APPs -  Physician Assistants and Nurse Practitioners) who all work together to provide you with the care you need, when you need it.  Follow-up with Dr. Rayann Heman within the next 2 weeks   Your next appointment:   2 month(s)  The format for your next appointment:   In Person  Provider:   You may see Fransico Him, MD or one of the following Advanced Practice Providers on your designated Care Team:    Melina Copa, PA-C  Ermalinda Barrios, PA-C

## 2019-09-19 ENCOUNTER — Telehealth: Payer: Self-pay | Admitting: Cardiology

## 2019-09-19 NOTE — Telephone Encounter (Signed)
[name]. LPN, this pt is requesting samples of Corlanor. Can you please advise on this matter? Thank you

## 2019-09-19 NOTE — Telephone Encounter (Signed)
Patient calling the office for samples of medication:   1.  What medication and dosage are you requesting samples for? ivabradine (CORLANOR) 5 MG TABS tablet  2.  Are you currently out of this medication?  Has 3 days left

## 2019-09-20 DIAGNOSIS — M6281 Muscle weakness (generalized): Secondary | ICD-10-CM | POA: Diagnosis not present

## 2019-09-20 DIAGNOSIS — M25512 Pain in left shoulder: Secondary | ICD-10-CM | POA: Diagnosis not present

## 2019-09-20 DIAGNOSIS — M25511 Pain in right shoulder: Secondary | ICD-10-CM | POA: Diagnosis not present

## 2019-09-20 NOTE — Telephone Encounter (Signed)
**Note De-Identified Ahnesti Townsend Obfuscation** I called [employer] and s/w Erma who advised me that this Corlanor appeal has been approved until 08/14/2020 and that he will fax [place] the approval letter now as we never received one. [id number]  I have notified [place] and the pt (I could not s/w either as I was on hold with [facility] for more than 15 mins so I called back and left a detailed message that the pts Corlanor has been approved. I called the pts home and cell phones but got no answer so I left a detailed VM explaining that his Corlanor has been approved by his ins provider and that he shold contavt [facility] concerning picking up his RX)) of this approval.

## 2019-09-20 NOTE — Telephone Encounter (Signed)
**Note De-Identified Monquie Fulgham Obfuscation** I called [employer] and s/w [place] who advised me that this Corlanor appeal has been approved until 08/14/2020 and that he will fax [place] the approval letter now as we never received one. [id number]  I have notified [place] and the pt (I could not s/w either as I was on hold with [facility] for more than 15 mins so I called back and left a detailed message that the pts Corlanor has been approved. I called the pts home and cell phones but got no answer so I left a detailed VM explaining that his Corlanor has been approved by his ins provider and that he shold contavt [facility] concerning picking up his RX)) of this approval.

## 2019-09-23 ENCOUNTER — Encounter: Payer: Self-pay | Admitting: Internal Medicine

## 2019-09-23 ENCOUNTER — Telehealth: Payer: Self-pay

## 2019-09-23 ENCOUNTER — Telehealth (INDEPENDENT_AMBULATORY_CARE_PROVIDER_SITE_OTHER): Payer: Medicare Other | Admitting: Internal Medicine

## 2019-09-23 VITALS — BP 112/70 | HR 92 | Ht 71.0 in | Wt 152.0 lb

## 2019-09-23 DIAGNOSIS — I5022 Chronic systolic (congestive) heart failure: Secondary | ICD-10-CM | POA: Diagnosis not present

## 2019-09-23 DIAGNOSIS — I471 Supraventricular tachycardia: Secondary | ICD-10-CM | POA: Diagnosis not present

## 2019-09-23 DIAGNOSIS — C3432 Malignant neoplasm of lower lobe, left bronchus or lung: Secondary | ICD-10-CM

## 2019-09-23 DIAGNOSIS — I2510 Atherosclerotic heart disease of native coronary artery without angina pectoris: Secondary | ICD-10-CM

## 2019-09-23 NOTE — Telephone Encounter (Signed)
-----   Message from Thompson Grayer, MD sent at 09/23/2019 12:20 PM EST ----- Follow-up with me in 3 months with an echo prior to the visit

## 2019-09-23 NOTE — Progress Notes (Signed)
Electrophysiology TeleHealth Note   Due to national recommendations of social distancing due to COVID 19, an audio/video telehealth visit is felt to be most appropriate for this patient at this time.  See MyChart message from today for the patient's consent to telehealth for [facility]   Date:  09/23/2019   ID:  [name], DOB 12/12/1950, MRN [id number]  Location: patient's home  Provider location:  [facility]  Evaluation Performed: Follow-up visit  PCP:  [name], MD   Electrophysiologist:  Dr Rayann Heman  Chief Complaint:  palpitations  History of Present Illness:    [name] is a 69 y.o. male who presents via telehealth conferencing today.  Since last being seen in our clinic, the patient reports doing reasonably well.  He recently had CHF symptoms for which he was placed on corlanor and had diuretics increased.  He is s/p XRT for LLL lung CA.  He had Stage I T2N0M0 disease per report. Today, he denies symptoms of palpitations, chest pain, shortness of breath,  lower extremity edema, dizziness, presyncope, or syncope.  The patient is otherwise without complaint today.   Past Medical History:  Diagnosis Date  . Anemia   . Arthritis   . Atrial tachycardia ([place])   . Chronic systolic CHF (congestive heart failure) ([place]) 07/04/2018  . CKD (chronic kidney disease), stage III   . Coronary artery disease 02/2012   a. s/p stenting in 2013  b. s/p CABGx2V (SVG--> PDA, SVG--> LCx).  . Diabetes mellitus    neuropathy  insulin dependent  . Dilated aortic root ([place])   . Dyslipidemia   . Erectile dysfunction   . GERD (gastroesophageal reflux disease)   . History of CVA (cerebrovascular accident)   . History of kidney stones   . Hx of radiation therapy to mediastinum 1985  . Hypertension   . 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 OSA (obstructive sleep apnea) 07/04/2018   Severe obstructive sleep apnea with an AHI of 30/h and mild central sleep apnea at  13.7/h with oxygen desaturations as low as 79%.  Intolerant to PAP therapy  . S/P TAVR (transcatheter aortic valve replacement) 05/15/2018   29 mm Edwards Sapien 3 transcatheter heart valve placed via percutaneous right transfemoral approach   . Severe aortic stenosis     Past Surgical History:  Procedure Laterality Date  . COLONOSCOPY  07/25/2012   Procedure: COLONOSCOPY;  Surgeon: Winfield Cunas., MD;  Location: [facility] ENDOSCOPY;  Service: Endoscopy;  Laterality: N/A;  . COLONOSCOPY N/A 12/02/2013   Procedure: COLONOSCOPY;  Surgeon: Winfield Cunas., MD;  Location: [facility] ENDOSCOPY;  Service: Endoscopy;  Laterality: N/A;  . CORONARY ARTERY BYPASS GRAFT N/A 01/04/2013   Procedure: CORONARY ARTERY BYPASS GRAFTING (CABG) times two using right saphenous vein harvested with endoscope.;  Surgeon: Ivin Poot, MD;  Location: [place];  Service: Open Heart Surgery;  Laterality: N/A;  . ESOPHAGOGASTRODUODENOSCOPY  07/20/2012   Procedure: ESOPHAGOGASTRODUODENOSCOPY (EGD);  Surgeon: Winfield Cunas., MD;  Location: [facility] ENDOSCOPY;  Service: Endoscopy;  Laterality: N/A;  . ESOPHAGOGASTRODUODENOSCOPY N/A 12/02/2013   Procedure: ESOPHAGOGASTRODUODENOSCOPY (EGD);  Surgeon: Winfield Cunas., MD;  Location: [facility];  Service: Endoscopy;  Laterality: N/A;  . HOT HEMOSTASIS N/A 12/02/2013   Procedure: HOT HEMOSTASIS (ARGON PLASMA COAGULATION/BICAP);  Surgeon: Winfield Cunas., MD;  Location: [facility]OS[facility];  Service: Endoscopy;  Laterality: N/A;  . INTRAOPERATIVE TRANSESOPHAGEAL ECHOCARDIOGRAM N/A 01/04/2013   Procedure: INTRAOPERATIVE TRANSESOPHAGEAL  ECHOCARDIOGRAM;  Surgeon: Ivin Poot, MD;  Location: [place];  Service: Open Heart Surgery;  Laterality: N/A;  . INTRAOPERATIVE TRANSTHORACIC ECHOCARDIOGRAM N/A 05/15/2018   Procedure: INTRAOPERATIVE TRANSTHORACIC ECHOCARDIOGRAM;  Surgeon: Burnell Blanks, MD;  Location: [place];  Service: Open Heart Surgery;  Laterality: N/A;  . IR THORACENTESIS ASP  PLEURAL SPACE W/IMG GUIDE  10/19/2018  . LEFT HEART CATHETERIZATION WITH CORONARY ANGIOGRAM N/A 03/06/2012   Procedure: LEFT HEART CATHETERIZATION WITH CORONARY ANGIOGRAM;  Surgeon: [name], MD;  Location: [place] CATH LAB;  Service: Cardiovascular;  Laterality: N/A;  . LITHOTRIPSY    . [place]  . RADIAL ARTERY HARVEST Left 01/04/2013   Procedure: RADIAL ARTERY HARVEST;  Surgeon: Ivin Poot, MD;  Location: [place];  Service: Vascular;  Laterality: Left;  Artery not havested. Unsuitable for use.  . resection mediastinal seminonma    . RIGHT/LEFT HEART CATH AND CORONARY/GRAFT ANGIOGRAPHY N/A 11/15/2016   Procedure: Right/Left Heart Cath and Coronary/Graft Angiography;  Surgeon: Leonie Man, MD;  Location: [place] CV LAB;  Service: Cardiovascular;  Laterality: N/A;  . RIGHT/LEFT HEART CATH AND CORONARY/GRAFT ANGIOGRAPHY N/A 04/13/2018   Procedure: RIGHT/LEFT HEART CATH AND CORONARY/GRAFT ANGIOGRAPHY;  Surgeon: Jolaine Artist, MD;  Location: [place] CV LAB;  Service: Cardiovascular;  Laterality: N/A;  . TRANSCATHETER AORTIC VALVE REPLACEMENT, TRANSFEMORAL  05/15/2018  . TRANSCATHETER AORTIC VALVE REPLACEMENT, TRANSFEMORAL N/A 05/15/2018   Procedure: TRANSCATHETER AORTIC VALVE REPLACEMENT, TRANSFEMORAL;  Surgeon: Burnell Blanks, MD;  Location: [place];  Service: Open Heart Surgery;  Laterality: N/A;    Current Outpatient Medications  Medication Sig Dispense Refill  . amitriptyline (ELAVIL) 25 MG tablet Take 25 mg by mouth at bedtime.     Marland Kitchen amoxicillin (AMOXIL) 500 MG capsule Take 1 capsule (500 mg total) by mouth as directed. Take 4 tablets (2000 mg ) 1 hour prior to dental procedures. 12 capsule 12  . aspirin 81 MG chewable tablet Chew 1 tablet (81 mg total) by mouth daily.    . B Complex-C (SUPER B COMPLEX PO) Take 1 tablet by mouth daily.    . B-D ULTRAFINE III SHORT PEN 31G X 8 MM MISC 1 each by Other route daily as needed (GLUCOSE TESTING (INSULINE NEEDLE)).      . Cholecalciferol (VITAMIN D-3) 5000 units TABS Take 5,000 Units by mouth daily.    . furosemide (LASIX) 20 MG tablet Take 20 mg by mouth daily.     . insulin detemir (LEVEMIR) 100 UNIT/ML injection Inject 40 Units into the skin every morning.    . insulin lispro (HUMALOG) 100 UNIT/ML KwikPen as needed.    . ivabradine (CORLANOR) 5 MG TABS tablet Take 1 tablet (5 mg total) by mouth 2 (two) times daily with a meal. 60 tablet 3  . levothyroxine (SYNTHROID, LEVOTHROID) 50 MCG tablet Take 50 mcg by mouth daily before breakfast.     . metFORMIN (GLUCOPHAGE) 500 MG tablet Take 2 tablets (1,000 mg total) by mouth 2 (two) times daily with a meal.    . metoprolol succinate (TOPROL-XL) 25 MG 24 hr tablet Take 3 tablets (75 mg total) by mouth daily. Take with or immediately following a meal. 270 tablet 3  . Multiple Vitamin (MULTIVITAMIN) tablet Take 1 tablet by mouth daily.    . ONE TOUCH ULTRA TEST test strip 1 each by Other route as needed for other.     . simvastatin (ZOCOR) 20 MG tablet Take 20 mg by mouth every evening.     Marland Kitchen  zolpidem (AMBIEN) 10 MG tablet Take 10 mg by mouth at bedtime.      No current facility-administered medications for this visit.    Allergies:   Lipitor [atorvastatin] and Adhesive [tape]   Social History:  The patient  reports that he quit smoking about 34 years ago. He has never used smokeless tobacco. He reports that he does not drink alcohol or use drugs.   Family History:  The patient's family history includes Cancer in his brother; Diabetes in his father, mother, and sister; Heart failure in his mother; Hypertension in his mother.   ROS:  Please see the history of present illness.   All other systems are personally reviewed and negative.    Exam:    Vital Signs:  BP 112/70   Pulse 92   Ht 5\' 11"  (1.803 m)   Wt 152 lb (68.9 kg)   SpO2 99%   BMI 21.20 kg/m   Well sounding and appearing, alert and conversant, regular work of breathing,  good skin  color Eyes- anicteric, neuro- grossly intact, skin- no apparent rash or lesions or cyanosis, mouth- oral mucosa is pink  Labs/Other Tests and Data Reviewed:    Recent Labs: 06/13/2019: ALT 17 08/30/2019: Hemoglobin 14.5; Platelets 263 09/06/2019: BUN 23; Creatinine, Ser 1.31; Magnesium 2.1; NT-Pro BNP 2,117; Potassium 4.4; Sodium 136; TSH 5.660   Wt Readings from Last 3 Encounters:  09/23/19 152 lb (68.9 kg)  09/18/19 156 lb 3.2 oz (70.9 kg)  09/06/19 152 lb 1.9 oz (69 kg)    ekgs are reviewed and reveal long RP tachycardia which is likely sinus tachycardia   ASSESSMENT & PLAN:    1.  Chronic systolic dysfunction/ ischemic CM/ LBBB The patient has recently had class III but is primarily class II with CHF. He has chronic long RP tachycardia which is likely sinus tach for which he has been placed on corlanor.  He is s/p XRT for LLL lung CA.  He is doing reasonably well. I discussed options of repeating echo after medicine optimization (recently started on corlanor).  We also discussed CRT-P and CRT-D options.  At this time, using a shared decision making proceed, the patient and his spouse would prefer to continue watchful waiting with hopes that his EF will recover with recent medicine changes. I think that this is a reasonable approach.  He will also continue to follow closely with Dr Lucy Antigua regarding his lung cancer status.  If his EF does not recover after optimization of medicines and he remains cancer free, we could consider further EP procedures down the road.   Follow-up:  I will see in 3 months with an echo.  Continue close follow-up with general cardiology for medicine optimization in the interim.   Patient Risk:  after full review of this patients clinical status, I feel that they are at moderate risk at this time.  Today, I have spent 20 minutes with the patient with telehealth technology discussing arrhythmia management .    Army Fossa, MD  09/23/2019 12:01 PM      [place] [address] [place] [id number] [phone] (office) [phone] (fax)

## 2019-09-27 DIAGNOSIS — Z23 Encounter for immunization: Secondary | ICD-10-CM | POA: Diagnosis not present

## 2019-10-03 NOTE — Telephone Encounter (Signed)
Recall entered d/t Pt not wanting to schedule ECHO at this time.

## 2019-10-08 DIAGNOSIS — M25512 Pain in left shoulder: Secondary | ICD-10-CM | POA: Diagnosis not present

## 2019-10-08 DIAGNOSIS — M6281 Muscle weakness (generalized): Secondary | ICD-10-CM | POA: Diagnosis not present

## 2019-10-08 DIAGNOSIS — M25511 Pain in right shoulder: Secondary | ICD-10-CM | POA: Diagnosis not present

## 2019-10-11 DIAGNOSIS — M25511 Pain in right shoulder: Secondary | ICD-10-CM | POA: Diagnosis not present

## 2019-10-11 DIAGNOSIS — M6281 Muscle weakness (generalized): Secondary | ICD-10-CM | POA: Diagnosis not present

## 2019-10-11 DIAGNOSIS — M25512 Pain in left shoulder: Secondary | ICD-10-CM | POA: Diagnosis not present

## 2019-10-15 ENCOUNTER — Telehealth: Payer: Self-pay | Admitting: *Deleted

## 2019-10-15 DIAGNOSIS — M6281 Muscle weakness (generalized): Secondary | ICD-10-CM | POA: Diagnosis not present

## 2019-10-15 DIAGNOSIS — M25512 Pain in left shoulder: Secondary | ICD-10-CM | POA: Diagnosis not present

## 2019-10-15 DIAGNOSIS — M25511 Pain in right shoulder: Secondary | ICD-10-CM | POA: Diagnosis not present

## 2019-10-15 NOTE — Telephone Encounter (Signed)
CALLED PATIENT TO INFORM OF CT FOR 10-24-19 - ARRIVAL TIME- 2:15 PM @ [facility], NO RESTRICTIONS TO TEST AND PATIENT TO FU WITH DR. KINARD ON 10-28-19 @ 3:45 PM, LVM FOR A RETURN CALL

## 2019-10-17 ENCOUNTER — Inpatient Hospital Stay: Admission: RE | Admit: 2019-10-17 | Payer: Self-pay | Source: Ambulatory Visit | Admitting: Radiation Oncology

## 2019-10-18 DIAGNOSIS — M6281 Muscle weakness (generalized): Secondary | ICD-10-CM | POA: Diagnosis not present

## 2019-10-18 DIAGNOSIS — M25511 Pain in right shoulder: Secondary | ICD-10-CM | POA: Diagnosis not present

## 2019-10-18 DIAGNOSIS — M25512 Pain in left shoulder: Secondary | ICD-10-CM | POA: Diagnosis not present

## 2019-10-22 DIAGNOSIS — M25511 Pain in right shoulder: Secondary | ICD-10-CM | POA: Diagnosis not present

## 2019-10-22 DIAGNOSIS — M25512 Pain in left shoulder: Secondary | ICD-10-CM | POA: Diagnosis not present

## 2019-10-22 DIAGNOSIS — M6281 Muscle weakness (generalized): Secondary | ICD-10-CM | POA: Diagnosis not present

## 2019-10-24 ENCOUNTER — Ambulatory Visit (HOSPITAL_COMMUNITY)
Admission: RE | Admit: 2019-10-24 | Discharge: 2019-10-24 | Disposition: A | Discharge status: Home / Self Care | Payer: Medicare Other | Source: Ambulatory Visit | Attending: Radiation Oncology | Admitting: Radiation Oncology

## 2019-10-24 ENCOUNTER — Other Ambulatory Visit: Payer: Self-pay

## 2019-10-24 DIAGNOSIS — C3432 Malignant neoplasm of lower lobe, left bronchus or lung: Secondary | ICD-10-CM | POA: Diagnosis not present

## 2019-10-25 DIAGNOSIS — Z23 Encounter for immunization: Secondary | ICD-10-CM | POA: Diagnosis not present

## 2019-10-28 ENCOUNTER — Other Ambulatory Visit: Payer: Self-pay

## 2019-10-28 ENCOUNTER — Ambulatory Visit
Admission: RE | Admit: 2019-10-28 | Discharge: 2019-10-28 | Disposition: A | Discharge status: Home / Self Care | Payer: Medicare Other | Source: Ambulatory Visit | Attending: Radiation Oncology | Admitting: Radiation Oncology

## 2019-10-28 ENCOUNTER — Encounter: Payer: Self-pay | Admitting: Radiation Oncology

## 2019-10-28 VITALS — BP 119/80 | HR 90 | Temp 97.8°F | Resp 20 | Wt 158.4 lb

## 2019-10-28 DIAGNOSIS — Z923 Personal history of irradiation: Secondary | ICD-10-CM | POA: Insufficient documentation

## 2019-10-28 DIAGNOSIS — C3432 Malignant neoplasm of lower lobe, left bronchus or lung: Secondary | ICD-10-CM

## 2019-10-28 DIAGNOSIS — R0609 Other forms of dyspnea: Secondary | ICD-10-CM | POA: Insufficient documentation

## 2019-10-28 DIAGNOSIS — Z79899 Other long term (current) drug therapy: Secondary | ICD-10-CM | POA: Diagnosis not present

## 2019-10-28 DIAGNOSIS — I700 Atherosclerosis of aorta: Secondary | ICD-10-CM | POA: Insufficient documentation

## 2019-10-28 DIAGNOSIS — J90 Pleural effusion, not elsewhere classified: Secondary | ICD-10-CM | POA: Insufficient documentation

## 2019-10-28 DIAGNOSIS — Z08 Encounter for follow-up examination after completed treatment for malignant neoplasm: Secondary | ICD-10-CM | POA: Diagnosis not present

## 2019-10-28 DIAGNOSIS — Z794 Long term (current) use of insulin: Secondary | ICD-10-CM | POA: Diagnosis not present

## 2019-10-28 DIAGNOSIS — R918 Other nonspecific abnormal finding of lung field: Secondary | ICD-10-CM | POA: Diagnosis not present

## 2019-10-28 DIAGNOSIS — Z8547 Personal history of malignant neoplasm of testis: Secondary | ICD-10-CM | POA: Diagnosis not present

## 2019-10-28 DIAGNOSIS — Z85118 Personal history of other malignant neoplasm of bronchus and lung: Secondary | ICD-10-CM | POA: Insufficient documentation

## 2019-10-28 NOTE — Progress Notes (Signed)
Radiation Oncology         [phone] ________________________________  Name: [name] MRN: [id number]  Date: 10/28/2019  DOB: February 02, 1951  Follow-Up Visit Note  CC: Bill Amel, MD  Bill Amel, MD    ICD-10-CM   1. Squamous cell carcinoma of bronchus in left lower lobe (HCC)  C34.32     Diagnosis: Stage IB(T2a, N0, M0) squamous cell carcinoma of the left lower lung  Interval Since Last Radiation: Ten months and three weeks.  12/03/2018 - 12/13/2018: LLL / 50 Gy in 5 fractions (SBRT)  1986: RT given as part of the management of his malignant seminoma presenting in the upper mediastinum. Details of this treatment are not available. The patient reports having several weeks of radiation therapy. He reports his treatments were given at [facility].  Narrative:  The patient returns today for routine follow-up and to review recent scans.  He underwent chest CT on 10/24/2019 that showed progressive changes of external beam radiation involving the superior segment of the left lower lobe, which obscures the original underlying primary lung neoplasm. There was also a stable 7 mm lung nodule in the right upper lobe and interval development of bilateral pleural effusions, left greater than right (favored to be secondary to CHF exacerbation).  Of note, the patient did have a CTA of chest done on 08/30/2019 for shortness of breath. Results showed moderate bilateral pleural effusion and left lower lobe atelectasis or pneumonia; early interstitial edema.  On review of systems, he reports occasional dyspnea with exertion. He denies pain in the chest significant cough or hemoptysis.  Reports breathing better since seeing his cardiologist a few weeks ago.  ALLERGIES:  is allergic to lipitor [atorvastatin] and adhesive [tape].  Meds: Current Outpatient Medications  Medication Sig Dispense Refill  . amitriptyline (ELAVIL) 25 MG tablet Take 25 mg by mouth at bedtime.     Marland Kitchen amoxicillin (AMOXIL)  500 MG capsule Take 1 capsule (500 mg total) by mouth as directed. Take 4 tablets (2000 mg ) 1 hour prior to dental procedures. 12 capsule 12  . aspirin 81 MG chewable tablet Chew 1 tablet (81 mg total) by mouth daily.    . B Complex-C (SUPER B COMPLEX PO) Take 1 tablet by mouth daily.    . B-D ULTRAFINE III SHORT PEN 31G X 8 MM MISC 1 each by Other route daily as needed (GLUCOSE TESTING (INSULINE NEEDLE)).     . Cholecalciferol (VITAMIN D-3) 5000 units TABS Take 5,000 Units by mouth daily.    . furosemide (LASIX) 20 MG tablet Take 20 mg by mouth daily.     . insulin detemir (LEVEMIR) 100 UNIT/ML injection Inject 40 Units into the skin every morning.    . insulin lispro (HUMALOG) 100 UNIT/ML KwikPen as needed.    . ivabradine (CORLANOR) 5 MG TABS tablet Take 1 tablet (5 mg total) by mouth 2 (two) times daily with a meal. 60 tablet 3  . levothyroxine (SYNTHROID, LEVOTHROID) 50 MCG tablet Take 50 mcg by mouth daily before breakfast.     . metFORMIN (GLUCOPHAGE) 500 MG tablet Take 2 tablets (1,000 mg total) by mouth 2 (two) times daily with a meal.    . metoprolol succinate (TOPROL-XL) 25 MG 24 hr tablet Take 3 tablets (75 mg total) by mouth daily. Take with or immediately following a meal. 270 tablet 3  . Multiple Vitamin (MULTIVITAMIN) tablet Take 1 tablet by mouth daily.    . ONE TOUCH ULTRA TEST test strip 1  each by Other route as needed for other.     . simvastatin (ZOCOR) 20 MG tablet Take 20 mg by mouth every evening.     . zolpidem (AMBIEN) 10 MG tablet Take 10 mg by mouth at bedtime.      No current facility-administered medications for this encounter.    Physical Findings: The patient is in no acute distress. Patient is alert and oriented.  weight is 158 lb 6.4 oz (71.8 kg). His temperature is 97.8 F (36.6 C). His blood pressure is 119/80 and his pulse is 90. His respiration is 20 and oxygen saturation is 100%.   Lungs are clear to auscultation bilaterally. Heart has regular rate and  rhythm. No palpable cervical, supraclavicular, or axillary adenopathy. Abdomen soft, non-tender, normal bowel sounds.   Lab Findings: Lab Results  Component Value Date   WBC 3.5 08/30/2019   HGB 14.5 08/30/2019   HCT 46.0 08/30/2019   MCV 81 08/30/2019   PLT 263 08/30/2019    Radiographic Findings: CT Chest Wo Contrast  Result Date: 10/24/2019 CLINICAL DATA:  Follow-up lung cancer. Currently asymptomatic. Squamous cell carcinoma of bronchus and left lower lobe EXAM: CT CHEST WITHOUT CONTRAST TECHNIQUE: Multidetector CT imaging of the chest was performed following the standard protocol without IV contrast. COMPARISON:  04/17/2019 FINDINGS: Cardiovascular: Previous median sternotomy and CABG procedure and aortic valvuloplasty. Aortic atherosclerosis. No pericardial effusion identified. Mediastinum/Nodes: Normal appearance of the thyroid gland. The trachea appears patent and is midline. Normal appearance of the esophagus. The hilar structures are suboptimally evaluated due to lack of IV contrast material. No mediastinal, supraclavicular, or axillary adenopathy. Lungs/Pleura: Interval development of small bilateral pleural effusions, left greater than right. Progressive masslike architectural distortion involving the superior segment of the left lower lobe compatible with changes secondary to external beam radiation. Mild bilateral upper lobe paramediastinal fibrosis is identified, also likely secondary to external beam radiation. The underlying lung mass within the central left lower lobe is obscured by changes of external beam radiation. Pulmonary nodule in the right upper lobe is unchanged measuring 7 mm, image 78/5. Upper Abdomen: No acute abnormality. Musculoskeletal: No chest wall mass or suspicious bone lesions identified. IMPRESSION: 1. There are progressive changes of external beam radiation involving the superior segment of left lower lobe. This obscures the original underlying primary lung  neoplasm. Stable 7 mm lung nodule in the right upper lobe. 2. Interval development of bilateral pleural effusions, left greater than right. Findings are favored to be secondary to CHF exacerbation. 3.  Aortic Atherosclerosis (ICD10-I70.0). Aortic Atherosclerosis (ICD10-I70.0). Electronically Signed   By: Kerby Moors M.D.   On: 10/24/2019 16:15    Impression:  Stage IB(T2a, N0, M0) squamous cell carcinoma of the left lower lung.   Clinically NED. Recent CT scan also shows no active disease within the chest. We discussed the CT scan today carefully, and the patient appears to understand.  Overall the pleural effusions in the chest have improved significantly compared to his scans in January  Plan:  Routine follow up in 6 months, and he will have another CT scan prior to or after this visit.  ____________________________________  Blair Promise, PhD, MD  This document serves as a record of services personally performed by Gery Pray, MD. It was created on his behalf by Clerance Lav, a trained medical scribe. The creation of this record is based on the scribe's personal observations and the provider's statements to them. This document has been checked and approved by the attending  provider.

## 2019-10-28 NOTE — Progress Notes (Signed)
[name] presents today for f/u with Dr. Sondra Come to review recent CT results. Pt denies c/o pain. Pt denies cough, hemoptysis. Pt reports occasional SOB and states has medication for that. Pt denies difficulty swallowing.   BP 119/80 (BP Location: Left Arm, Patient Position: Sitting, Cuff Size: Normal)   Pulse 90   Temp 97.8 F (36.6 C)   Resp 20   Wt 158 lb 6.4 oz (71.8 kg)   SpO2 100%   BMI 22.09 kg/m   Wt Readings from Last 3 Encounters:  10/28/19 158 lb 6.4 oz (71.8 kg)  09/23/19 152 lb (68.9 kg)  09/18/19 156 lb 3.2 oz (70.9 kg)   Loma Sousa, RN BSN

## 2019-10-28 NOTE — Patient Instructions (Signed)
Coronavirus (COVID-19) Are you at risk?  Are you at risk for the Coronavirus (COVID-19)?  To be considered HIGH RISK for Coronavirus (COVID-19), you have to meet the following criteria:  . Traveled to [place], [place], [place], [place] or [place]; or in the [place] to [place], [place], [place], or [place]; and have fever, cough, and shortness of breath within the last 2 weeks of travel OR . Been in close contact with a person diagnosed with COVID-19 within the last 2 weeks and have fever, cough, and shortness of breath . IF YOU DO NOT MEET THESE CRITERIA, YOU ARE CONSIDERED LOW RISK FOR COVID-19.  What to do if you are HIGH RISK for COVID-19?  . If you are having a medical emergency, call 911. . Seek medical care right away. Before you go to a doctor's office, urgent care or emergency department, call ahead and tell them about your recent travel, contact with someone diagnosed with COVID-19, and your symptoms. You should receive instructions from your physician's office regarding next steps of care.  . When you arrive at healthcare provider, tell the healthcare staff immediately you have returned from visiting [place], [place], [place], [place] or [place]; or traveled in the [place] to [place], [place], [place], or [place]; in the last two weeks or you have been in close contact with a person diagnosed with COVID-19 in the last 2 weeks.   . Tell the health care staff about your symptoms: fever, cough and shortness of breath. . After you have been seen by a medical provider, you will be either: o Tested for (COVID-19) and discharged home on quarantine except to seek medical care if symptoms worsen, and asked to  - Stay home and avoid contact with others until you get your results (4-5 days)  - Avoid travel on public transportation if possible (such as bus, train, or airplane) or o Sent to the Emergency Department by EMS for evaluation, COVID-19 testing, and possible  admission depending on your condition and test results.  What to do if you are LOW RISK for COVID-19?  Reduce your risk of any infection by using the same precautions used for avoiding the common cold or flu:  . Wash your hands often with soap and warm water for at least 20 seconds.  If soap and water are not readily available, use an alcohol-based hand sanitizer with at least 60% alcohol.  . If coughing or sneezing, cover your mouth and nose by coughing or sneezing into the elbow areas of your shirt or coat, into a tissue or into your sleeve (not your hands). . Avoid shaking hands with others and consider head nods or verbal greetings only. . Avoid touching your eyes, nose, or mouth with unwashed hands.  . Avoid close contact with people who are sick. . Avoid places or events with large numbers of people in one location, like concerts or sporting events. . Carefully consider travel plans you have or are making. . If you are planning any travel outside or inside the [place], visit the CDC's Travelers' Health webpage for the latest health notices. . If you have some symptoms but not all symptoms, continue to monitor at home and seek medical attention if your symptoms worsen. . If you are having a medical emergency, call 911.   ADDITIONAL HEALTHCARE OPTIONS FOR PATIENTS  [place] Tele[facility] / e-Visit: [email or web address]         [facility]: [phone]  [place]   [facility]: [phone]                   [facility]: [phone]   

## 2019-10-30 ENCOUNTER — Telehealth: Payer: Medicare Other | Admitting: Internal Medicine

## 2019-11-01 ENCOUNTER — Telehealth: Payer: Self-pay | Admitting: Cardiology

## 2019-11-01 DIAGNOSIS — M25512 Pain in left shoulder: Secondary | ICD-10-CM | POA: Diagnosis not present

## 2019-11-01 DIAGNOSIS — M6281 Muscle weakness (generalized): Secondary | ICD-10-CM | POA: Diagnosis not present

## 2019-11-01 DIAGNOSIS — M25511 Pain in right shoulder: Secondary | ICD-10-CM | POA: Diagnosis not present

## 2019-11-01 NOTE — Telephone Encounter (Signed)
FMLA/disability form received from [employer]. Placed in box for Dr. Radford Pax to review. 11/01/19 vlm

## 2019-11-05 DIAGNOSIS — M25512 Pain in left shoulder: Secondary | ICD-10-CM | POA: Diagnosis not present

## 2019-11-05 DIAGNOSIS — M6281 Muscle weakness (generalized): Secondary | ICD-10-CM | POA: Diagnosis not present

## 2019-11-05 DIAGNOSIS — M25511 Pain in right shoulder: Secondary | ICD-10-CM | POA: Diagnosis not present

## 2019-11-12 DIAGNOSIS — M25512 Pain in left shoulder: Secondary | ICD-10-CM | POA: Diagnosis not present

## 2019-11-12 DIAGNOSIS — M6281 Muscle weakness (generalized): Secondary | ICD-10-CM | POA: Diagnosis not present

## 2019-11-12 DIAGNOSIS — M25511 Pain in right shoulder: Secondary | ICD-10-CM | POA: Diagnosis not present

## 2019-11-15 DIAGNOSIS — M6281 Muscle weakness (generalized): Secondary | ICD-10-CM | POA: Diagnosis not present

## 2019-11-15 DIAGNOSIS — M25511 Pain in right shoulder: Secondary | ICD-10-CM | POA: Diagnosis not present

## 2019-11-15 DIAGNOSIS — M25512 Pain in left shoulder: Secondary | ICD-10-CM | POA: Diagnosis not present

## 2019-11-20 DIAGNOSIS — M6281 Muscle weakness (generalized): Secondary | ICD-10-CM | POA: Diagnosis not present

## 2019-11-20 DIAGNOSIS — M25511 Pain in right shoulder: Secondary | ICD-10-CM | POA: Diagnosis not present

## 2019-11-20 DIAGNOSIS — M25512 Pain in left shoulder: Secondary | ICD-10-CM | POA: Diagnosis not present

## 2019-11-22 DIAGNOSIS — M25512 Pain in left shoulder: Secondary | ICD-10-CM | POA: Diagnosis not present

## 2019-11-22 DIAGNOSIS — M25511 Pain in right shoulder: Secondary | ICD-10-CM | POA: Diagnosis not present

## 2019-11-22 DIAGNOSIS — M6281 Muscle weakness (generalized): Secondary | ICD-10-CM | POA: Diagnosis not present

## 2019-11-25 ENCOUNTER — Ambulatory Visit: Payer: Medicare Other | Admitting: Cardiology

## 2019-11-27 DIAGNOSIS — E11319 Type 2 diabetes mellitus with unspecified diabetic retinopathy without macular edema: Secondary | ICD-10-CM | POA: Diagnosis not present

## 2019-11-27 DIAGNOSIS — N529 Male erectile dysfunction, unspecified: Secondary | ICD-10-CM | POA: Diagnosis not present

## 2019-11-27 DIAGNOSIS — E1165 Type 2 diabetes mellitus with hyperglycemia: Secondary | ICD-10-CM | POA: Diagnosis not present

## 2019-11-27 DIAGNOSIS — E7800 Pure hypercholesterolemia, unspecified: Secondary | ICD-10-CM | POA: Diagnosis not present

## 2019-11-27 DIAGNOSIS — C3490 Malignant neoplasm of unspecified part of unspecified bronchus or lung: Secondary | ICD-10-CM | POA: Diagnosis not present

## 2019-11-27 DIAGNOSIS — I10 Essential (primary) hypertension: Secondary | ICD-10-CM | POA: Diagnosis not present

## 2019-11-27 DIAGNOSIS — E039 Hypothyroidism, unspecified: Secondary | ICD-10-CM | POA: Diagnosis not present

## 2019-11-27 DIAGNOSIS — N189 Chronic kidney disease, unspecified: Secondary | ICD-10-CM | POA: Diagnosis not present

## 2019-12-03 DIAGNOSIS — M6281 Muscle weakness (generalized): Secondary | ICD-10-CM | POA: Diagnosis not present

## 2019-12-03 DIAGNOSIS — M25512 Pain in left shoulder: Secondary | ICD-10-CM | POA: Diagnosis not present

## 2019-12-03 DIAGNOSIS — M25511 Pain in right shoulder: Secondary | ICD-10-CM | POA: Diagnosis not present

## 2019-12-05 ENCOUNTER — Encounter: Payer: Self-pay | Admitting: Cardiology

## 2019-12-05 ENCOUNTER — Ambulatory Visit (INDEPENDENT_AMBULATORY_CARE_PROVIDER_SITE_OTHER): Payer: Medicare Other | Admitting: Cardiology

## 2019-12-05 ENCOUNTER — Other Ambulatory Visit: Payer: Self-pay

## 2019-12-05 VITALS — BP 112/64 | HR 88 | Ht 71.0 in | Wt 160.8 lb

## 2019-12-05 DIAGNOSIS — I5022 Chronic systolic (congestive) heart failure: Secondary | ICD-10-CM

## 2019-12-05 DIAGNOSIS — E785 Hyperlipidemia, unspecified: Secondary | ICD-10-CM | POA: Diagnosis not present

## 2019-12-05 DIAGNOSIS — I350 Nonrheumatic aortic (valve) stenosis: Secondary | ICD-10-CM | POA: Diagnosis not present

## 2019-12-05 DIAGNOSIS — R000 Tachycardia, unspecified: Secondary | ICD-10-CM | POA: Diagnosis not present

## 2019-12-05 DIAGNOSIS — I10 Essential (primary) hypertension: Secondary | ICD-10-CM | POA: Diagnosis not present

## 2019-12-05 DIAGNOSIS — I2510 Atherosclerotic heart disease of native coronary artery without angina pectoris: Secondary | ICD-10-CM

## 2019-12-05 MED ORDER — LOSARTAN POTASSIUM 25 MG PO TABS: 12.5000 mg | ORAL_TABLET | Freq: Every day | ORAL | 3 refills | Status: DC

## 2019-12-05 NOTE — Patient Instructions (Signed)
Medication Instructions:  Your physician has recommended you make the following change in your medication:  1) START taking losartan (Cozaar) 12.5 mg (1/2 tablet) daily  *If you need a refill on your cardiac medications before your next appointment, please call your pharmacy*   Lab Work: BMET in one week.   If you have labs (blood work) drawn today and your tests are completely normal, you will receive your results only by: Marland Kitchen MyChart Message (if you have MyChart) OR . A paper copy in the mail If you have any lab test that is abnormal or we need to change your treatment, we will call you to review the results.  Follow-Up: At [facility], you and your health needs are our priority.  As part of our continuing mission to provide you with exceptional heart care, we have created designated Provider Care Teams.  These Care Teams include your primary Cardiologist (physician) and Advanced Practice Providers (APPs -  Physician Assistants and Nurse Practitioners) who all work together to provide you with the care you need, when you need it.   Your next appointment:   3 month(s)  The format for your next appointment:   In Person  Provider:   Fransico Him, MD

## 2019-12-05 NOTE — Progress Notes (Signed)
Cardiology Office Note:    Date:  12/05/2019   ID:  [name], DOB [birth year], MRN [id number]  PCP:  [name], MD  Cardiologist:  Fransico Him, MD    Referring MD: [name], MD   Chief Complaint  Patient presents with  . Congestive Heart Failure  . Hypertension  . Sleep Apnea  . Aortic Stenosis    History of Present Illness:    [name] is a 69 y.o. male with a hx of CAD, aortic stenosis and systolic HF. Hehad anon-STEMI in 2013 treated with PCI of the LCx/OM. LHC in 5/14 demonstrated severe 2 vessel CAD with critical in-stent restenosis in the LCx. He underwent CABG (SVG-PDA, SVG-LCx). He also has a history of aortic stenosis. Echo 10/2016 showed moderately reduced LVF at 35-40% with diffuse HK, mild AS and moderate AR. Right and Left heart cath showed mild to moderate AS with AVA 0.837cm2 and EF 45% with patent SVG to RPDA and SVG to OM1 With occluded native vessels. He ultimately was diagnosed with low gradient aortic stenosis with AI and underwent TAVR with a 29 mm EdwardsSapein3 THV via transfemoral approach after being admitted with acute exacerbation of CHF on 05/15/2018.  His baseline weightin the past had been around 162lbs butover the past few months has been running about 154lbs. He has not tolerated Entresto due to hypotension. He has been on low dose BB. He only take Lasix PRN for weight >165lbs and has only been taking recently about twice weekly. He recently saw Dr. Rayann Heman after repeat 2D echo showed persistently reduced LVF with EF 20-25%. Due to active lung Ca he was felt not to be a candidate for ICD but Dr. Rayann Heman felt he was a candidate for CRT-D but patient did not want to pursue at that time.  \He was also found to have SVT which was unclear whether sinus in origin or atrial tach.  He was already on Toprol which was increased to 75mg  daily.  2D echo and lexiscan myoview were ordered but cancelled on followup study as HR was still  not under control and wanted to HR controlled better first.  A ddimer was elevated and CHest CTA was neg for PE but showed LLL atelectasis vs. PNA with early interstitial edema.  He was started on Lasix 40mg  BID for 3 days.  He was seen back by Melina Copa, PA and EKG showed persistence of SVT.  He was started on Corlanor.  It was felt that possibly his CHF was exacerbated by his tachycardia. His weight was down 5lbs at that OV and his Lasix was decreased to 20mg  daily with 2gm Na diet and 2L fluid restriction.  He was seen by Dr. Rayann Heman in February regarding ICD and wanted to wait and see if EF improve on maximal med therapy.  Last EF 05/2019 was 30%.    He is here today for followup and is doing well.  He tells me that his SOB has significantly improved since I saw him last.  He denies any chest pain or pressure, , PND, orthopnea,  dizziness, palpitations or syncope. He occasionally has some mild edema in his feet. He is compliant with his meds and is tolerating meds with no SE.    Past Medical History:  Diagnosis Date  . Anemia   . Arthritis   . Atrial tachycardia ([place])   . Chronic systolic CHF (congestive heart failure) ([place]) 07/04/2018  . CKD (chronic kidney disease), stage III   .  Coronary artery disease 02/2012   a. s/p stenting in 2013  b. s/p CABGx2V (SVG--> PDA, SVG--> LCx).  . Diabetes mellitus    neuropathy  insulin dependent  . Dilated aortic root ([place])   . Dyslipidemia   . Erectile dysfunction   . GERD (gastroesophageal reflux disease)   . History of CVA (cerebrovascular accident)   . History of kidney stones   . Hx of radiation therapy to mediastinum 1985  . Hypertension   . 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 OSA (obstructive sleep apnea) 07/04/2018   Severe obstructive sleep apnea with an AHI of 30/h and mild central sleep apnea at 13.7/h with oxygen desaturations as low as 79%.  Intolerant to PAP therapy  . S/P TAVR (transcatheter aortic valve replacement)  05/15/2018   29 mm Edwards Sapien 3 transcatheter heart valve placed via percutaneous right transfemoral approach   . Severe aortic stenosis     Past Surgical History:  Procedure Laterality Date  . COLONOSCOPY  07/25/2012   Procedure: COLONOSCOPY;  Surgeon: Winfield Cunas., MD;  Location: [facility] ENDOSCOPY;  Service: Endoscopy;  Laterality: N/A;  . COLONOSCOPY N/A 12/02/2013   Procedure: COLONOSCOPY;  Surgeon: Winfield Cunas., MD;  Location: [facility] ENDOSCOPY;  Service: Endoscopy;  Laterality: N/A;  . CORONARY ARTERY BYPASS GRAFT N/A 01/04/2013   Procedure: CORONARY ARTERY BYPASS GRAFTING (CABG) times two using right saphenous vein harvested with endoscope.;  Surgeon: Ivin Poot, MD;  Location: [place];  Service: Open Heart Surgery;  Laterality: N/A;  . ESOPHAGOGASTRODUODENOSCOPY  07/20/2012   Procedure: ESOPHAGOGASTRODUODENOSCOPY (EGD);  Surgeon: Winfield Cunas., MD;  Location: [facility] ENDOSCOPY;  Service: Endoscopy;  Laterality: N/A;  . ESOPHAGOGASTRODUODENOSCOPY N/A 12/02/2013   Procedure: ESOPHAGOGASTRODUODENOSCOPY (EGD);  Surgeon: Winfield Cunas., MD;  Location: [facility] ENDOSCOPY;  Service: Endoscopy;  Laterality: N/A;  . HOT HEMOSTASIS N/A 12/02/2013   Procedure: HOT HEMOSTASIS (ARGON PLASMA COAGULATION/BICAP);  Surgeon: Winfield Cunas., MD;  Location: [facility] ENDOSCOPY;  Service: Endoscopy;  Laterality: N/A;  . INTRAOPERATIVE TRANSESOPHAGEAL ECHOCARDIOGRAM N/A 01/04/2013   Procedure: INTRAOPERATIVE TRANSESOPHAGEAL ECHOCARDIOGRAM;  Surgeon: Ivin Poot, MD;  Location: [place];  Service: Open Heart Surgery;  Laterality: N/A;  . INTRAOPERATIVE TRANSTHORACIC ECHOCARDIOGRAM N/A 05/15/2018   Procedure: INTRAOPERATIVE TRANSTHORACIC ECHOCARDIOGRAM;  Surgeon: Burnell Blanks, MD;  Location: [place];  Service: Open Heart Surgery;  Laterality: N/A;  . IR THORACENTESIS ASP PLEURAL SPACE W/IMG GUIDE  10/19/2018  . LEFT HEART CATHETERIZATION WITH CORONARY ANGIOGRAM N/A 03/06/2012   Procedure: LEFT HEART  CATHETERIZATION WITH CORONARY ANGIOGRAM;  Surgeon: [name], MD;  Location: [facility] CATH LAB;  Service: Cardiovascular;  Laterality: N/A;  . LITHOTRIPSY    . [place]  . RADIAL ARTERY HARVEST Left 01/04/2013   Procedure: RADIAL ARTERY HARVEST;  Surgeon: Ivin Poot, MD;  Location: [place];  Service: Vascular;  Laterality: Left;  Artery not havested. Unsuitable for use.  . resection mediastinal seminonma    . RIGHT/LEFT HEART CATH AND CORONARY/GRAFT ANGIOGRAPHY N/A 11/15/2016   Procedure: Right/Left Heart Cath and Coronary/Graft Angiography;  Surgeon: Leonie Man, MD;  Location: [facility] CV LAB;  Service: Cardiovascular;  Laterality: N/A;  . RIGHT/LEFT HEART CATH AND CORONARY/GRAFT ANGIOGRAPHY N/A 04/13/2018   Procedure: RIGHT/LEFT HEART CATH AND CORONARY/GRAFT ANGIOGRAPHY;  Surgeon: Jolaine Artist, MD;  Location: [place] CV LAB;  Service: Cardiovascular;  Laterality: N/A;  . TRANSCATHETER AORTIC VALVE REPLACEMENT, TRANSFEMORAL  05/15/2018  . TRANSCATHETER AORTIC VALVE  REPLACEMENT, TRANSFEMORAL N/A 05/15/2018   Procedure: TRANSCATHETER AORTIC VALVE REPLACEMENT, TRANSFEMORAL;  Surgeon: Burnell Blanks, MD;  Location: [facility];  Service: Open Heart Surgery;  Laterality: N/A;    Current Medications: Current Meds  Medication Sig  . amitriptyline (ELAVIL) 25 MG tablet Take 25 mg by mouth at bedtime.   Marland Kitchen amoxicillin (AMOXIL) 500 MG capsule Take 1 capsule (500 mg total) by mouth as directed. Take 4 tablets (2000 mg ) 1 hour prior to dental procedures.  Marland Kitchen aspirin 81 MG chewable tablet Chew 1 tablet (81 mg total) by mouth daily.  . B Complex-C (SUPER B COMPLEX PO) Take 1 tablet by mouth daily.  . B-D ULTRAFINE III SHORT PEN 31G X 8 MM MISC 1 each by Other route daily as needed (GLUCOSE TESTING (INSULINE NEEDLE)).   . Cholecalciferol (VITAMIN D-3) 5000 units TABS Take 5,000 Units by mouth daily.  . furosemide (LASIX) 20 MG tablet Take 20 mg by mouth daily.   . insulin  detemir (LEVEMIR) 100 UNIT/ML injection Inject 40 Units into the skin every morning.  . insulin lispro (HUMALOG) 100 UNIT/ML KwikPen as needed.  . ivabradine (CORLANOR) 5 MG TABS tablet Take 1 tablet (5 mg total) by mouth 2 (two) times daily with a meal.  . levothyroxine (SYNTHROID, LEVOTHROID) 50 MCG tablet Take 50 mcg by mouth daily before breakfast.   . metFORMIN (GLUCOPHAGE) 500 MG tablet Take 2 tablets (1,000 mg total) by mouth 2 (two) times daily with a meal.  . metoprolol succinate (TOPROL-XL) 25 MG 24 hr tablet Take 3 tablets (75 mg total) by mouth daily. Take with or immediately following a meal.  . Multiple Vitamin (MULTIVITAMIN) tablet Take 1 tablet by mouth daily.  . ONE TOUCH ULTRA TEST test strip 1 each by Other route as needed for other.   . simvastatin (ZOCOR) 20 MG tablet Take 20 mg by mouth every evening.   . zolpidem (AMBIEN) 10 MG tablet Take 10 mg by mouth at bedtime.      Allergies:   Lipitor [atorvastatin] and Adhesive [tape]   Social History   Socioeconomic History  . Marital status: Married    Spouse name: Not on file  . Number of children: Not on file  . Years of education: Not on file  . Highest education [name]: Not on file  Occupational History  . Not on file  Tobacco Use  . Smoking status: Former Smoker    Quit date: 03/06/1985    Years since quitting: 34.7  . Smokeless tobacco: Never Used  Substance and Sexual Activity  . Alcohol use: No  . Drug use: No  . Sexual activity: Not Currently  Other Topics Concern  . Not on file  Social History Narrative  . Not on file   Social Determinants of Health   Financial Resource Strain:   . Difficulty of Paying Living Expenses:   Food Insecurity:   . Worried About Charity fundraiser in the Last Year:   . Arboriculturist in the Last Year:   Transportation Needs:   . Film/video editor (Medical):   Marland Kitchen Lack of Transportation (Non-Medical):   Physical Activity:   . Days of Exercise per Week:   .  Minutes of Exercise per Session:   Stress:   . Feeling of Stress :   Social Connections:   . Frequency of Communication with Friends and Family:   . Frequency of Social Gatherings with Friends and Family:   . Attends Religious  Services:   . Active Member of Clubs or Organizations:   . Attends Archivist Meetings:   Marland Kitchen Marital Status:      Family History: The patient's family history includes Cancer in his brother; Diabetes in his father, mother, and sister; Heart failure in his mother; Hypertension in his mother.  ROS:   Please see the history of present illness.    ROS  All other systems reviewed and negative.   EKGs/Labs/Other Studies Reviewed:    The following studies were reviewed today: none  EKG:  EKG is not ordered today.    Recent Labs: 06/13/2019: ALT 17 08/30/2019: Hemoglobin 14.5; Platelets 263 09/06/2019: BUN 23; Creatinine, Ser 1.31; Magnesium 2.1; NT-Pro BNP 2,117; Potassium 4.4; Sodium 136; TSH 5.660   Recent Lipid Panel    Component Value Date/Time   CHOL 127 06/13/2019 0846   TRIG 47 06/13/2019 0846   HDL 42 06/13/2019 0846   CHOLHDL 3.0 06/13/2019 0846   CHOLHDL 2.9 04/12/2018 0216   VLDL 7 04/12/2018 0216   LDLCALC 74 06/13/2019 0846    Physical Exam:    VS:  BP 112/64   Pulse 88   Ht 5\' 11"  (1.803 m)   Wt 160 lb 12.8 oz (72.9 kg)   SpO2 99%   BMI 22.43 kg/m     Wt Readings from Last 3 Encounters:  12/05/19 160 lb 12.8 oz (72.9 kg)  10/28/19 158 lb 6.4 oz (71.8 kg)  09/23/19 152 lb (68.9 kg)     GEN:  Well nourished, well developed in no acute distress HEENT: Normal NECK: No JVD; No carotid bruits LYMPHATICS: No lymphadenopathy CARDIAC: RRR, no murmurs, rubs, gallops RESPIRATORY:  Clear to auscultation without rales, wheezing or rhonchi  ABDOMEN: Soft, non-tender, non-distended MUSCULOSKELETAL:  No edema; No deformity  SKIN: Warm and dry NEUROLOGIC:  Alert and oriented x 3 PSYCHIATRIC:  Normal affect   ASSESSMENT:     1. Chronic systolic CHF (congestive heart failure) ([place])   2. Coronary artery disease involving native coronary artery of native heart without angina pectoris   3. Essential hypertension   4. Aortic stenosis, severe   5. Dyslipidemia   6. Tachycardia    PLAN:    In order of problems listed above:  1.Chronic systolic CHF -His baseline weight154- 157lbs and is up to 160lbs and has had some pedal edema on and off -His echo in 2019 showed moderate to severely reduced LV function with EF 30 to 35% with hypokinesis of the anterior septum and inferior wall. Repeat echo 01/2019 showed EF 20-25% -he his NYYHA Class 2a - he is able to work outside and cut the grass without problems -He did not tolerate Entresto due to hypotension but has not been on just Losartan -will continue on Lasix 20mg  daily -continue Toprol XL 75mg  daily and Corlanor 5mg  BID -he has not tolerated Entresto in the past so will add Losartan 12.5mg  daily -followup with PharmD in 2 weeks for uptitration of ARB as BP allows and check BMET at that time -evaluated in EP clinic and wants to hold off on AICD until repeat echo in May done  2.ASCAD -s/p non-STEMI in 2013 treated with PCI of the LCx/OM. -LHC in 5/14 demonstrated severe 2 vessel CAD with critical in-stent restenosis in the LCx.  -He underwent CABG (SVG-PDA, SVG-LCx). -Repeat left heart cath showed patent SVG to RPDA, SVG to OM1, 50% ostial D1, 40% proximal to mid LAD, occluded ostial RCA, occluded ostial left circumflex, 40% ostial  LAD. -he has not had any anginal chest pain -Since SOB resolved after diuresis will not pursue Lexiscan myoview at this time -Continue ASA, BB and statin  3. Hypertension  -BP well controlled -continue Toprol XL 75mg  daily  4.Severe low gradient low outputAS -S/P TAVR 10-1-19with29 mm EdwardsSapein3 THV via transfemoral approach. -2D echo10/21/2020showed a stable aortic valve TAVR withtrivialperivalvular  AR. Mean aortic valve gradient67mmHg. -He will continue on ASA 81mg  daily.   5. Hyperlipidemia -his LDL goal is less than 70. -LDL was 74 in October -He will continue on Zocor 40 mg daily.  6. Tachycardia -EKG initially with ? Atrial tachycardia at 110bpm -Now in NSR with HR 88bpm today on Corlanor 5mg  BID -continue Toprol XL to 75mg  daily -recent TSH was normal   Medication Adjustments/Labs and Tests Ordered: Current medicines are reviewed at length with the patient today.  Concerns regarding medicines are outlined above.  No orders of the defined types were placed in this encounter.  No orders of the defined types were placed in this encounter.   Signed, Fransico Him, MD  12/05/2019 10:33 AM    [place]

## 2019-12-05 NOTE — Addendum Note (Signed)
Addended by: Antonieta Iba on: 12/05/2019 01:24 PM   Modules accepted: Orders

## 2019-12-06 DIAGNOSIS — M25511 Pain in right shoulder: Secondary | ICD-10-CM | POA: Diagnosis not present

## 2019-12-06 DIAGNOSIS — M25512 Pain in left shoulder: Secondary | ICD-10-CM | POA: Diagnosis not present

## 2019-12-06 DIAGNOSIS — M6281 Muscle weakness (generalized): Secondary | ICD-10-CM | POA: Diagnosis not present

## 2019-12-10 DIAGNOSIS — M25512 Pain in left shoulder: Secondary | ICD-10-CM | POA: Diagnosis not present

## 2019-12-10 DIAGNOSIS — M6281 Muscle weakness (generalized): Secondary | ICD-10-CM | POA: Diagnosis not present

## 2019-12-10 DIAGNOSIS — M25511 Pain in right shoulder: Secondary | ICD-10-CM | POA: Diagnosis not present

## 2019-12-13 DIAGNOSIS — M25512 Pain in left shoulder: Secondary | ICD-10-CM | POA: Diagnosis not present

## 2019-12-13 DIAGNOSIS — M25511 Pain in right shoulder: Secondary | ICD-10-CM | POA: Diagnosis not present

## 2019-12-13 DIAGNOSIS — M6281 Muscle weakness (generalized): Secondary | ICD-10-CM | POA: Diagnosis not present

## 2019-12-17 DIAGNOSIS — M25511 Pain in right shoulder: Secondary | ICD-10-CM | POA: Diagnosis not present

## 2019-12-17 DIAGNOSIS — M6281 Muscle weakness (generalized): Secondary | ICD-10-CM | POA: Diagnosis not present

## 2019-12-17 DIAGNOSIS — M25512 Pain in left shoulder: Secondary | ICD-10-CM | POA: Diagnosis not present

## 2019-12-19 DIAGNOSIS — M25511 Pain in right shoulder: Secondary | ICD-10-CM | POA: Diagnosis not present

## 2019-12-19 DIAGNOSIS — G4700 Insomnia, unspecified: Secondary | ICD-10-CM | POA: Diagnosis not present

## 2019-12-23 ENCOUNTER — Other Ambulatory Visit: Payer: Self-pay

## 2019-12-23 ENCOUNTER — Ambulatory Visit (HOSPITAL_COMMUNITY): Payer: Medicare Other | Attending: Cardiology

## 2019-12-23 DIAGNOSIS — I5022 Chronic systolic (congestive) heart failure: Secondary | ICD-10-CM | POA: Diagnosis not present

## 2019-12-24 ENCOUNTER — Ambulatory Visit (INDEPENDENT_AMBULATORY_CARE_PROVIDER_SITE_OTHER): Payer: Medicare Other | Admitting: Pharmacist

## 2019-12-24 VITALS — BP 100/60 | HR 90

## 2019-12-24 DIAGNOSIS — I2510 Atherosclerotic heart disease of native coronary artery without angina pectoris: Secondary | ICD-10-CM

## 2019-12-24 DIAGNOSIS — I5022 Chronic systolic (congestive) heart failure: Secondary | ICD-10-CM | POA: Diagnosis not present

## 2019-12-24 NOTE — Progress Notes (Signed)
Patient ID: FAREED FUNG                 DOB: 11-18-1950                      MRN: [id number]     HPI: [name] is a 69 y.o. male referred by Dr. Radford Pax to PharmD clinic. PMH is significant for CAD (NSTEMI 2013), aortic stenosis s/p TAVR and systolic HF.  He is s/p XRT for LLL lung CA. He has had a persistently low EF. Last echo showed EF <20% (12/23/19). He has not tolerated Entresto in the past due to hypotension. Recently started on losartan 12.5mg  daily. Sent to PharmD clinic for management.  Patient presents today for initial visit in PharmD clinic. He is concerned that ever since he started losartan he has had sniffling and a cough. He states he has mild lightheadedness occasionally that is not bothersome. Denies headache, blurred visio or  SOB with activities of daily living. Some SOB on exertion. Has swelling in feet but is well controlled with furosemide. His wife takes his blood pressure. He does not remember exact readings. This its been in the 893 systolic range. 100/60 in clinic today. HR was 90. He has been taking only 2 tablets of metoprolol.  Patient saw echo results on mychart and has questions. Is interested now in talking about CRT-P and CRT-D options.   Current CHF meds: Metoprolol succinate 50mg  daily, ivabradine 5mg  BID, losartan 12.5mg  daily Previously tried: Entresto (hypotension)  Family History: The patient's family history includes Cancer in his brother; Diabetes in his father, mother, and sister; Heart failure in his mother; Hypertension in his mother.   Social History: The patient  reports that he quit smoking about 34 years ago. He has never used smokeless tobacco. He reports that he does not drink alcohol or use drugs.   Diet:   Exercise:   Home BP readings: doesn't check everyday- doesn't know what it was last  Wt Readings from Last 3 Encounters:  12/05/19 160 lb 12.8 oz (72.9 kg)  10/28/19 158 lb 6.4 oz (71.8 kg)  09/23/19 152 lb (68.9 kg)   BP  Readings from Last 3 Encounters:  12/05/19 112/64  10/28/19 119/80  09/23/19 112/70   Pulse Readings from Last 3 Encounters:  12/05/19 88  10/28/19 90  09/23/19 92    Renal function: CrCl cannot be calculated (Patient's most recent lab result is older than the maximum 21 days allowed.).  Past Medical History:  Diagnosis Date  . Anemia   . Arthritis   . Atrial tachycardia ([place])   . Chronic systolic CHF (congestive heart failure) ([place]) 07/04/2018  . CKD (chronic kidney disease), stage III   . Coronary artery disease 02/2012   a. s/p stenting in 2013  b. s/p CABGx2V (SVG--> PDA, SVG--> LCx).  . Diabetes mellitus    neuropathy  insulin dependent  . Dilated aortic root ([place])   . Dyslipidemia   . Erectile dysfunction   . GERD (gastroesophageal reflux disease)   . History of CVA (cerebrovascular accident)   . History of kidney stones   . Hx of radiation therapy to mediastinum 1985  . Hypertension   . 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 OSA (obstructive sleep apnea) 07/04/2018   Severe obstructive sleep apnea with an AHI of 30/h and mild central sleep apnea at 13.7/h with oxygen desaturations as low as 79%.  Intolerant to PAP therapy  .  S/P TAVR (transcatheter aortic valve replacement) 05/15/2018   29 mm Edwards Sapien 3 transcatheter heart valve placed via percutaneous right transfemoral approach   . Severe aortic stenosis     Current Outpatient Medications on File Prior to Visit  Medication Sig Dispense Refill  . amitriptyline (ELAVIL) 25 MG tablet Take 25 mg by mouth at bedtime.     Marland Kitchen amoxicillin (AMOXIL) 500 MG capsule Take 1 capsule (500 mg total) by mouth as directed. Take 4 tablets (2000 mg ) 1 hour prior to dental procedures. 12 capsule 12  . aspirin 81 MG chewable tablet Chew 1 tablet (81 mg total) by mouth daily.    . B Complex-C (SUPER B COMPLEX PO) Take 1 tablet by mouth daily.    . B-D ULTRAFINE III SHORT PEN 31G X 8 MM MISC 1 each by Other route daily  as needed (GLUCOSE TESTING (INSULINE NEEDLE)).     . Cholecalciferol (VITAMIN D-3) 5000 units TABS Take 5,000 Units by mouth daily.    . furosemide (LASIX) 20 MG tablet Take 20 mg by mouth daily.     . insulin detemir (LEVEMIR) 100 UNIT/ML injection Inject 40 Units into the skin every morning.    . insulin lispro (HUMALOG) 100 UNIT/ML KwikPen as needed.    . ivabradine (CORLANOR) 5 MG TABS tablet Take 1 tablet (5 mg total) by mouth 2 (two) times daily with a meal. 60 tablet 3  . levothyroxine (SYNTHROID, LEVOTHROID) 50 MCG tablet Take 50 mcg by mouth daily before breakfast.     . losartan (COZAAR) 25 MG tablet Take 0.5 tablets (12.5 mg total) by mouth daily. 45 tablet 3  . metFORMIN (GLUCOPHAGE) 500 MG tablet Take 2 tablets (1,000 mg total) by mouth 2 (two) times daily with a meal.    . metoprolol succinate (TOPROL-XL) 25 MG 24 hr tablet Take 3 tablets (75 mg total) by mouth daily. Take with or immediately following a meal. 270 tablet 3  . Multiple Vitamin (MULTIVITAMIN) tablet Take 1 tablet by mouth daily.    . ONE TOUCH ULTRA TEST test strip 1 each by Other route as needed for other.     . simvastatin (ZOCOR) 20 MG tablet Take 20 mg by mouth every evening.     . zolpidem (AMBIEN) 10 MG tablet Take 10 mg by mouth at bedtime.      No current facility-administered medications on file prior to visit.    Allergies  Allergen Reactions  . Lipitor [Atorvastatin] Other (See Comments)    Muscle pain  . Adhesive [Tape] Rash    There were no vitals taken for this visit.   Assessment/Plan:  1. Hypertension - Blood pressure is on the lower side today. Patient thinks that losartan is causing a cough and he states he "feels like he has a cold." I advised that since he seems to have a runny nose, cough is more likely from post nasal drip than from losartan. ARB is low risk of cough and does not typically cause a runny nose. Ive asked him to continue medication for a few more weeks. If symptoms (which  seem to me to be allergies) do not improve, we can try stopping the losartan. Patient in agreement. I also asked him to increase his metoprolol to 3 tablets (75mg  daily) and to check BP once a day and record the readings to bring with him to follow up appointment. Will see him back in clinic in 3 weeks. I will send message to Dr. Rayann Heman  that patient would like to discuss CRT-P and CRT-D options.  Thank you  Ramond Dial, Pharm.D, BCPS, CPP [place]  [address] [place],  [address]  Phone: [phone]; Fax: [phone]

## 2019-12-24 NOTE — Patient Instructions (Signed)
It was a pleasure to meet you today!  Please increase your metoprolol back to 75mg  (3 tablets) daily Please resume losartan 12.5mg  daily  Please check blood pressure and heart rate once a day and record the readings. Please bring them with you to your next appointment.  Call us at [phone] with any questions or concerns

## 2019-12-30 ENCOUNTER — Other Ambulatory Visit: Payer: Self-pay

## 2019-12-30 MED ORDER — FUROSEMIDE 20 MG PO TABS: 20.0000 mg | ORAL_TABLET | Freq: Every day | ORAL | 3 refills | Status: DC

## 2019-12-31 DIAGNOSIS — M6281 Muscle weakness (generalized): Secondary | ICD-10-CM | POA: Diagnosis not present

## 2019-12-31 DIAGNOSIS — M25512 Pain in left shoulder: Secondary | ICD-10-CM | POA: Diagnosis not present

## 2019-12-31 DIAGNOSIS — M25511 Pain in right shoulder: Secondary | ICD-10-CM | POA: Diagnosis not present

## 2020-01-03 DIAGNOSIS — M67911 Unspecified disorder of synovium and tendon, right shoulder: Secondary | ICD-10-CM | POA: Diagnosis not present

## 2020-01-08 ENCOUNTER — Encounter: Payer: Self-pay | Admitting: Internal Medicine

## 2020-01-08 ENCOUNTER — Other Ambulatory Visit: Payer: Self-pay

## 2020-01-08 ENCOUNTER — Ambulatory Visit (INDEPENDENT_AMBULATORY_CARE_PROVIDER_SITE_OTHER): Payer: Medicare Other | Admitting: Internal Medicine

## 2020-01-08 VITALS — BP 110/60 | HR 84 | Ht 71.0 in | Wt 154.6 lb

## 2020-01-08 DIAGNOSIS — I420 Dilated cardiomyopathy: Secondary | ICD-10-CM | POA: Diagnosis not present

## 2020-01-08 DIAGNOSIS — I2510 Atherosclerotic heart disease of native coronary artery without angina pectoris: Secondary | ICD-10-CM | POA: Diagnosis not present

## 2020-01-08 DIAGNOSIS — I447 Left bundle-branch block, unspecified: Secondary | ICD-10-CM | POA: Diagnosis not present

## 2020-01-08 DIAGNOSIS — I255 Ischemic cardiomyopathy: Secondary | ICD-10-CM

## 2020-01-08 DIAGNOSIS — I5022 Chronic systolic (congestive) heart failure: Secondary | ICD-10-CM

## 2020-01-08 NOTE — Progress Notes (Signed)
PCP: [name], MD Primary Cardiologist: Dr Radford Pax Primary EP: Dr Hulda Marin is a 69 y.o. male who presents today for routine electrophysiology followup.  Since last being seen in our clinic, the patient reports doing reasonably well.  He reports SOB with walking 100 yards but is active.  He mows his own lawn.  Today, he denies symptoms of palpitations, chest pain, lower extremity edema, dizziness, presyncope, or syncope.  The patient is otherwise without complaint today.   Past Medical History:  Diagnosis Date  . Anemia   . Arthritis   . Atrial tachycardia ([place])   . Chronic systolic CHF (congestive heart failure) ([place]) 07/04/2018  . CKD (chronic kidney disease), stage III   . Coronary artery disease 02/2012   a. s/p stenting in 2013  b. s/p CABGx2V (SVG--> PDA, SVG--> LCx).  . Diabetes mellitus    neuropathy  insulin dependent  . Dilated aortic root ([place])   . Dyslipidemia   . Erectile dysfunction   . GERD (gastroesophageal reflux disease)   . History of CVA (cerebrovascular accident)   . History of kidney stones   . Hx of radiation therapy to mediastinum 1985  . Hypertension   . 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 OSA (obstructive sleep apnea) 07/04/2018   Severe obstructive sleep apnea with an AHI of 30/h and mild central sleep apnea at 13.7/h with oxygen desaturations as low as 79%.  Intolerant to PAP therapy  . S/P TAVR (transcatheter aortic valve replacement) 05/15/2018   29 mm Edwards Sapien 3 transcatheter heart valve placed via percutaneous right transfemoral approach   . Severe aortic stenosis    Past Surgical History:  Procedure Laterality Date  . COLONOSCOPY  07/25/2012   Procedure: COLONOSCOPY;  Surgeon: Winfield Cunas., MD;  Location: [facility] ENDOSCOPY;  Service: Endoscopy;  Laterality: N/A;  . COLONOSCOPY N/A 12/02/2013   Procedure: COLONOSCOPY;  Surgeon: Winfield Cunas., MD;  Location: [facility] ENDOSCOPY;  Service: Endoscopy;  Laterality:  N/A;  . CORONARY ARTERY BYPASS GRAFT N/A 01/04/2013   Procedure: CORONARY ARTERY BYPASS GRAFTING (CABG) times two using right saphenous vein harvested with endoscope.;  Surgeon: Ivin Poot, MD;  Location: [place];  Service: Open Heart Surgery;  Laterality: N/A;  . ESOPHAGOGASTRODUODENOSCOPY  07/20/2012   Procedure: ESOPHAGOGASTRODUODENOSCOPY (EGD);  Surgeon: Winfield Cunas., MD;  Location: [facility] ENDOSCOPY;  Service: Endoscopy;  Laterality: N/A;  . ESOPHAGOGASTRODUODENOSCOPY N/A 12/02/2013   Procedure: ESOPHAGOGASTRODUODENOSCOPY (EGD);  Surgeon: Winfield Cunas., MD;  Location: [facility] ENDOSCOPY;  Service: Endoscopy;  Laterality: N/A;  . HOT HEMOSTASIS N/A 12/02/2013   Procedure: HOT HEMOSTASIS (ARGON PLASMA COAGULATION/BICAP);  Surgeon: Winfield Cunas., MD;  Location: [facility] ENDOSCOPY;  Service: Endoscopy;  Laterality: N/A;  . INTRAOPERATIVE TRANSESOPHAGEAL ECHOCARDIOGRAM N/A 01/04/2013   Procedure: INTRAOPERATIVE TRANSESOPHAGEAL ECHOCARDIOGRAM;  Surgeon: Ivin Poot, MD;  Location: [place];  Service: Open Heart Surgery;  Laterality: N/A;  . INTRAOPERATIVE TRANSTHORACIC ECHOCARDIOGRAM N/A 05/15/2018   Procedure: INTRAOPERATIVE TRANSTHORACIC ECHOCARDIOGRAM;  Surgeon: Burnell Blanks, MD;  Location: [place];  Service: Open Heart Surgery;  Laterality: N/A;  . IR THORACENTESIS ASP PLEURAL SPACE W/IMG GUIDE  10/19/2018  . LEFT HEART CATHETERIZATION WITH CORONARY ANGIOGRAM N/A 03/06/2012   Procedure: LEFT HEART CATHETERIZATION WITH CORONARY ANGIOGRAM;  Surgeon: [name], MD;  Location: [facility] CATH LAB;  Service: Cardiovascular;  Laterality: N/A;  . LITHOTRIPSY    . [place]  . RADIAL ARTERY HARVEST Left  01/04/2013   Procedure: RADIAL ARTERY HARVEST;  Surgeon: Ivin Poot, MD;  Location: [place];  Service: Vascular;  Laterality: Left;  Artery not havested. Unsuitable for use.  . resection mediastinal seminonma    . RIGHT/LEFT HEART CATH AND CORONARY/GRAFT ANGIOGRAPHY N/A 11/15/2016    Procedure: Right/Left Heart Cath and Coronary/Graft Angiography;  Surgeon: Leonie Man, MD;  Location: [facility] CV LAB;  Service: Cardiovascular;  Laterality: N/A;  . RIGHT/LEFT HEART CATH AND CORONARY/GRAFT ANGIOGRAPHY N/A 04/13/2018   Procedure: RIGHT/LEFT HEART CATH AND CORONARY/GRAFT ANGIOGRAPHY;  Surgeon: Jolaine Artist, MD;  Location: [facility] [place] CV LAB;  Service: Cardiovascular;  Laterality: N/A;  . TRANSCATHETER AORTIC VALVE REPLACEMENT, TRANSFEMORAL  05/15/2018  . TRANSCATHETER AORTIC VALVE REPLACEMENT, TRANSFEMORAL N/A 05/15/2018   Procedure: TRANSCATHETER AORTIC VALVE REPLACEMENT, TRANSFEMORAL;  Surgeon: Burnell Blanks, MD;  Location: [facility];  Service: Open Heart Surgery;  Laterality: N/A;    ROS- all systems are reviewed and negatives except as per HPI above  Current Outpatient Medications  Medication Sig Dispense Refill  . amitriptyline (ELAVIL) 25 MG tablet Take 25 mg by mouth at bedtime.     Marland Kitchen amoxicillin (AMOXIL) 500 MG capsule Take 1 capsule (500 mg total) by mouth as directed. Take 4 tablets (2000 mg ) 1 hour prior to dental procedures. 12 capsule 12  . aspirin 81 MG chewable tablet Chew 1 tablet (81 mg total) by mouth daily.    . B Complex-C (SUPER B COMPLEX PO) Take 1 tablet by mouth daily.    . B-D ULTRAFINE III SHORT PEN 31G X 8 MM MISC 1 each by Other route daily as needed (GLUCOSE TESTING (INSULINE NEEDLE)).     . Cholecalciferol (VITAMIN D-3) 5000 units TABS Take 5,000 Units by mouth daily.    . furosemide (LASIX) 20 MG tablet Take 1 tablet (20 mg total) by mouth daily. 90 tablet 3  . insulin detemir (LEVEMIR) 100 UNIT/ML injection Inject 40 Units into the skin every morning. 20u in the AM 10u at night    . insulin lispro (HUMALOG) 100 UNIT/ML KwikPen as needed.    . ivabradine (CORLANOR) 5 MG TABS tablet Take 1 tablet (5 mg total) by mouth 2 (two) times daily with a meal. 60 tablet 3  . levothyroxine (SYNTHROID, LEVOTHROID) 50 MCG tablet Take 50 mcg  by mouth daily before breakfast.     . losartan (COZAAR) 25 MG tablet Take 0.5 tablets (12.5 mg total) by mouth daily. 45 tablet 3  . metFORMIN (GLUCOPHAGE) 500 MG tablet Take 2 tablets (1,000 mg total) by mouth 2 (two) times daily with a meal.    . metoprolol succinate (TOPROL-XL) 25 MG 24 hr tablet Take 3 tablets (75 mg total) by mouth daily. Take with or immediately following a meal. (Patient taking differently: Take 50 mg by mouth daily. Take with or immediately following a meal.) 270 tablet 3  . Multiple Vitamin (MULTIVITAMIN) tablet Take 1 tablet by mouth daily.    . ONE TOUCH ULTRA TEST test strip 1 each by Other route as needed for other.     . simvastatin (ZOCOR) 20 MG tablet Take 20 mg by mouth every evening.     . zolpidem (AMBIEN) 10 MG tablet Take 10 mg by mouth at bedtime.     Marland Kitchen zolpidem (AMBIEN) 5 MG tablet Take 5 mg by mouth at bedtime as needed for sleep.     No current facility-administered medications for this visit.    Physical Exam: Vitals:  01/08/20 1619  BP: 110/60  Pulse: 84  SpO2: 97%  Weight: 154 lb 9.6 oz (70.1 kg)  Height: 5\' 11"  (1.803 m)    GEN- The patient is well appearing, alert and oriented x 3 today.   Head- normocephalic, atraumatic Eyes-  Sclera clear, conjunctiva pink Ears- hearing intact Oropharynx- clear Lungs- Clear to ausculation bilaterally, normal work of breathing Heart- Regular rate and rhythm, no murmurs, rubs or gallops, PMI not laterally displaced GI- soft, NT, ND, + BS Extremities- no clubbing, cyanosis, or edema  Wt Readings from Last 3 Encounters:  01/08/20 154 lb 9.6 oz (70.1 kg)  12/05/19 160 lb 12.8 oz (72.9 kg)  10/28/19 158 lb 6.4 oz (71.8 kg)   Echo 12/23/19- EF <20%, mild to moderate MR EKG tracing ordered today is personally reviewed and shows sinus rhythm 84 bpm, PR 222 msec, LBBB with QRS 146 msec  Assessment and Plan:  1. Chronic systolic dysfunction/ ischemic CM/ LBBB He has NYHA Class III CHF The patient has  an ischemic CM (EF <20%), NYHA Class III CHF, and CAD.  He is referred by Dr Radford Pax for risk stratification of sudden death and consideration of ICD implantation.  At this time, he meets MADIT II/ SCD-HeFT criteria for ICD implantation for primary prevention of sudden death.  He has QRS > 130 msec with LBBB and therefore has IIa indication for CRT.  I have had a thorough discussion with the patient and his wife reviewing options.  We discussed CRT-P and CRT-D options. The patient and wife have had opportunities to ask questions and have them answered. The patient and I have decided together through a shared decision making process to proceed with BiV ICD at this time.   Risks, benefits, alternatives to BiV ICD implantation were discussed in detail with the patient today. The patient understands that the risks include but are not limited to bleeding, infection, pneumothorax, perforation, tamponade, vascular damage, renal failure, MI, stroke, death, inappropriate shocks, and lead dislodgement and wishes to proceed.  We will therefore schedule device implantation at the next available time.  2. Hypertensive cardiovascular disease Stable No change required today  3. S/p TAVR Doing well  4. Squamous cell cancer of the bronchus in the LLL- stage Ib (T2a, N0,M0) Dr Clabe Seal note from 10/28/19 is reviewed He had CT recently which showed no active disease within the chest.  Risks, benefits and potential toxicities for medications prescribed and/or refilled reviewed with patient today.   Thompson Grayer MD, [facility] 01/08/2020 4:42 PM

## 2020-01-08 NOTE — Patient Instructions (Addendum)
Medication Instructions:  Your physician recommends that you continue on your current medications as directed. Please refer to the Current Medication list given to you today.  Labwork: None ordered.  Testing/Procedures: None ordered.  Follow-Up:  SEE INSTRUCTION LETTER  Any Other Special Instructions Will Be Listed Below (If Applicable).  If you need a refill on your cardiac medications before your next appointment, please call your pharmacy.    Biventricular Pacemaker Implantation A biventricular pacemaker implantation is a procedure to place (implant) a pacemaker near the heart. A pacemaker is a small, battery-powered device that helps control the heartbeat. If the heart beats irregularly or too slowly (bradycardia), the pacemaker will pace the heart so that it beats at a normal rate or a programmed rate. The parts of a biventricular pacemaker include:  The pulse generator. The pulse generator contains a small computer and a memory system that is programmed to keep the heart beating at a certain rate. The pulse generator also produces the electrical signal that triggers the heart to beat. This is implanted under the skin of the upper chest, near the collarbone.  Wires (leads). There may be two or three leads placed in the heart--one to the right atrium, one to the right ventricle, and one through the coronary sinus to reach the left ventricle of the heart. The leads are connected to the pulse generator. They transmit electrical pulses from the pulse generator to the heart. A biventricular pacemaker is used in people with heart failure due to weak heart muscles. The pacemaker can help the heart chambers pump more efficiently. This procedure may be done to treat:  Symptoms of severe heart failure, such as shortness of breath (dyspnea).  Loss of consciousness that happens repeatedly (syncope) because of an irregular heart rate. Tell a health care provider about:  Any allergies you  have.  All medicines you are taking, including vitamins, herbs, eye drops, creams, and over-the-counter medicines.  Any problems you or family members have had with anesthetic medicines.  Any blood disorders you have.  Any surgeries you have had.  Any medical conditions you have.  Whether you are pregnant or may be pregnant. What are the risks? Generally, this is a safe procedure. However, problems may occur, including:  Infection.  Swelling, bruising, or bleeding at the pacemaker site, especially if you take blood thinners.  Allergic reactions to medicines or dyes.  Damage to blood vessels or nerves near the pacemaker.  Failure of the pacemaker to improve your condition.  Collapsed lung.  Blood clots.  Lead failures. This may require more surgery. What happens before the procedure? Medicines Ask your health care provider about:  Changing or stopping your regular medicines. This is especially important if you are taking diabetes medicines or blood thinners.  Taking medicines such as aspirin and ibuprofen. These medicines can thin your blood. Do not take these medicines unless your health care provider tells you to take them.  Taking over-the-counter medicines, vitamins, herbs, and supplements. Eating and drinking Follow instructions from your health care provider about eating and drinking, which may include:  8 hours before the procedure - stop eating heavy meals or foods, such as meat, fried foods, or fatty foods.  6 hours before the procedure - stop eating light meals or foods, such as toast or cereal.  6 hours before the procedure - stop drinking milk or drinks that contain milk.  2 hours before the procedure - stop drinking clear liquids. Staying hydrated Follow instructions from your health  care provider about hydration, which may include:  Up to 2 hours before the procedure - you may continue to drink clear liquids, such as water, clear fruit juice, black  coffee, and plain tea.  General instructions  Do not use any products that contain nicotine or tobacco for at least 4 weeks before the procedure. These products include cigarettes, e-cigarettes, and chewing tobacco. If you need help quitting, ask your health care provider.  You may have tests, including: ? Blood tests. ? Chest X-rays. ? Echocardiogram. This is a test that uses sound waves (ultrasound) to produce an image of the heart.  Ask your health care provider: ? How your surgery site will be marked. ? What steps will be taken to help prevent infection. These may include:  Removing hair at the surgery site.  Washing skin with a germ-killing soap.  Taking antibiotic medicine.  Plan to have someone take you home from the hospital or clinic.  If you will be going home right after the procedure, plan to have someone with you for 24 hours. What happens during the procedure?   An IV will be inserted into one of your veins.  You will be connected to a heart monitor. Large electrode pads will be placed on the front and back of your chest.  You will be given one or more of the following: ? A medicine to help you relax (sedative). ? A medicine to make you fall asleep (general anesthetic). ? A medicine that is injected into an area of your body to numb the area (local anesthetic).  An incision will be made in your upper chest, near your heart.  The leads will be guided into your incision, through your blood vessels, and into your heart. Your health care provider will use an X-ray machine (fluoroscope) to guide the leads into your heart.  The leads will be attached to your heart muscles and to the pulse generator.  The leads will be tested to make sure that they work correctly.  The pulse generator will be implanted under your skin, near your incision.  The heart monitor will be watched to ensure that the pacer is working correctly.  Your incision will be closed with stitches  (sutures), skin glue, or adhesive tape.  A bandage (dressing) will be placed over your incision. The procedure may vary among health care providers and hospitals. What happens after the procedure?  Your blood pressure, heart rate, breathing rate, and blood oxygen level will be monitored until you leave the hospital or clinic.  You may continue to receive fluids and medicines through an IV.  You will be given pain medicine as needed.  You will have a chest X-ray done. This is to make sure that your pacemaker is in the right place.  You will be given a pacemaker identification card. This card lists the implant date, device model, and manufacturer of your pacemaker.  Do not drive for 24 hours if you were given a sedative during your procedure. Summary  A pacemaker is a small, battery-powered device that helps control the heartbeat.  A biventricular pacemaker is used in people with heart failure due to weak heart muscles.  Follow instructions from your health care provider about taking medicines and about eating and drinking before the procedure.  You will be given a pacemaker identification card that lists the implant date, device model, and manufacturer of your pacemaker. This information is not intended to replace advice given to you by your health care  provider. Make sure you discuss any questions you have with your health care provider. Document Revised: 07/04/2018 Document Reviewed: 07/04/2018 Elsevier Patient Education  2020 [employer].

## 2020-01-10 DIAGNOSIS — S46001D Unspecified injury of muscle(s) and tendon(s) of the rotator cuff of right shoulder, subsequent encounter: Secondary | ICD-10-CM | POA: Diagnosis not present

## 2020-01-14 ENCOUNTER — Ambulatory Visit (INDEPENDENT_AMBULATORY_CARE_PROVIDER_SITE_OTHER): Payer: Medicare Other | Admitting: Pharmacist

## 2020-01-14 ENCOUNTER — Other Ambulatory Visit: Payer: Self-pay

## 2020-01-14 VITALS — BP 118/66 | HR 82 | Wt 154.0 lb

## 2020-01-14 DIAGNOSIS — I255 Ischemic cardiomyopathy: Secondary | ICD-10-CM

## 2020-01-14 DIAGNOSIS — I5022 Chronic systolic (congestive) heart failure: Secondary | ICD-10-CM | POA: Diagnosis not present

## 2020-01-14 DIAGNOSIS — I10 Essential (primary) hypertension: Secondary | ICD-10-CM

## 2020-01-14 MED ORDER — LOSARTAN POTASSIUM 25 MG PO TABS
25.0000 mg | ORAL_TABLET | Freq: Every day | ORAL | 0 refills | Status: DC
Start: 2020-01-14 — End: 2020-04-27

## 2020-01-14 NOTE — Progress Notes (Signed)
Patient ID: [name]                 DOB: 1950/11/21                      MRN: [id number]   HPI: [name] is a 69 y.o. male referred by Dr. Radford [name] to PharmD clinic. PMH is significant for CAD (NSTEMI 2013), aortic stenosis s/p TAVR and systolic HF. He is s/p XRT for LLL lung CA. He has had a persistently low EF. Last echo showed EF <20% (12/23/19). He has not tolerated Entresto in the past due to hypotension. Recently started on losartan 12.5mg  daily.  Seen by Dr [name] on 01/08/20 EP follow up.  Graded as NYHA Class III CHF with EF <20%.  Scheduled for 6/29 for ICD implantation.  Patient presents today in good spirits.  Reports he feels well, ad has not had any SOB.  Is going to racetrack this Thursday and plans to park 100 yards away and walk 12 flights of stairs so he will gauge his SOB after that.  Denies headaches, somnolence, or medication adverse effects.  Patient reports wife occasionally takes his BP at home but does not write down the results and does not have any log.  Does not remember any BP values.  Reports no cough or runny nose which was a complaint at last visit.    Current HTN meds: metoprolol succinate 75 mg daily, furosemide 20 mg daily, losartan 12.5 mg daily, Corlanor 5 mg BID Previously tried: entresto  BP goal: <130/80   Wt Readings from Last 3 Encounters:  01/14/20 154 lb (69.9 kg)  01/08/20 154 lb 9.6 oz (70.1 kg)  12/05/19 160 lb 12.8 oz (72.9 kg)   BP Readings from Last 3 Encounters:  01/14/20 118/66  01/08/20 110/60  12/24/19 100/60   Pulse Readings from Last 3 Encounters:  01/14/20 82  01/08/20 84  12/24/19 90    Renal function: CrCl cannot be calculated (Patient's most recent lab result is older than the maximum 21 days allowed.).  Past Medical History:  Diagnosis Date  . Anemia   . Arthritis   . Atrial tachycardia ([place])   . Chronic systolic CHF (congestive heart failure) ([place]) 07/04/2018  . CKD (chronic kidney disease), stage III    . Coronary artery disease 02/2012   a. s/p stenting in 2013  b. s/p CABGx2V (SVG--> PDA, SVG--> LCx).  . Diabetes mellitus    neuropathy  insulin dependent  . Dilated aortic root ([place])   . Dyslipidemia   . Erectile dysfunction   . GERD (gastroesophageal reflux disease)   . History of CVA (cerebrovascular accident)   . History of kidney stones   . Hx of radiation therapy to mediastinum 1985  . Hypertension   . 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 OSA (obstructive sleep apnea) 07/04/2018   Severe obstructive sleep apnea with an AHI of 30/h and mild central sleep apnea at 13.7/h with oxygen desaturations as low as 79%.  Intolerant to PAP therapy  . S/P TAVR (transcatheter aortic valve replacement) 05/15/2018   29 mm Edwards Sapien 3 transcatheter heart valve placed via percutaneous right transfemoral approach   . Severe aortic stenosis     Current Outpatient Medications on File Prior to Visit  Medication Sig Dispense Refill  . amitriptyline (ELAVIL) 25 MG tablet Take 25 mg by mouth at bedtime.     Marland Kitchen amoxicillin (AMOXIL) 500 MG capsule Take 1  capsule (500 mg total) by mouth as directed. Take 4 tablets (2000 mg ) 1 hour prior to dental procedures. 12 capsule 12  . aspirin 81 MG chewable tablet Chew 1 tablet (81 mg total) by mouth daily.    . B Complex-C (SUPER B COMPLEX PO) Take 1 tablet by mouth daily.    . B-D ULTRAFINE III SHORT PEN 31G X 8 MM MISC 1 each by Other route daily as needed (GLUCOSE TESTING (INSULINE NEEDLE)).     . Cholecalciferol (VITAMIN D-3) 5000 units TABS Take 5,000 Units by mouth daily.    . furosemide (LASIX) 20 MG tablet Take 1 tablet (20 mg total) by mouth daily. 90 tablet 3  . insulin detemir (LEVEMIR) 100 UNIT/ML injection Inject 40 Units into the skin every morning. 20u in the AM 10u at night    . insulin lispro (HUMALOG) 100 UNIT/ML KwikPen as needed.    . ivabradine (CORLANOR) 5 MG TABS tablet Take 1 tablet (5 mg total) by mouth 2 (two) times daily  with a meal. 60 tablet 3  . levothyroxine (SYNTHROID, LEVOTHROID) 50 MCG tablet Take 50 mcg by mouth daily before breakfast.     . metFORMIN (GLUCOPHAGE) 500 MG tablet Take 2 tablets (1,000 mg total) by mouth 2 (two) times daily with a meal.    . Multiple Vitamin (MULTIVITAMIN) tablet Take 1 tablet by mouth daily.    . ONE TOUCH ULTRA TEST test strip 1 each by Other route as needed for other.     . simvastatin (ZOCOR) 20 MG tablet Take 20 mg by mouth every evening.     . zolpidem (AMBIEN) 5 MG tablet Take 5 mg by mouth at bedtime as needed for sleep.    Marland Kitchen zolpidem (AMBIEN) 10 MG tablet Take 10 mg by mouth at bedtime.      No current facility-administered medications on file prior to visit.    Allergies  Allergen Reactions  . Lipitor [Atorvastatin] Other (See Comments)    Muscle pain  . Adhesive [Tape] Rash     Assessment/Plan:  1. CHF: Patient weight steady and BP 118/66 which is at goal of less than 130/70.  Recommended continuing exercise as tolerated and following low sodium diet.  Patient's CHF medications not titrated.  Discussed with patient option of increasing metoprolol succinate to 100mg  daily (which would also decrease pill burden since patient is currently taking three 25 mg tablets at one time) versus increasing losartan to 25 mg daily.  Patient is agreeable to increasing losartan.  Recommended patient have wife check BP at home and keep log.  Recheck as needed.  Karren Cobble, PharmD, BCACP, [facility] [address] [place], [place] [address] Phone: [phone]; Fax: [phone] 01/15/2020 9:14 AM

## 2020-01-14 NOTE — Patient Instructions (Addendum)
It was nice meeting you today!  Increase your losartan from 12.5 mg to 25 mg.  (Take 1 tablet instead of 1/2 tablet once a day)  Continue taking metoprolol 25mg  3 tablets daily Continue furosemide 20 mg daily  Remember to come to the lab for bloodwork on 6/25  Please give [place] a call with any questions!  Karren Cobble, PharmD, BCACP, [place] [address], [place], [place] [address] Phone: [phone]; Fax: [phone] 01/14/2020 12:17 PM

## 2020-01-20 DIAGNOSIS — S46001D Unspecified injury of muscle(s) and tendon(s) of the rotator cuff of right shoulder, subsequent encounter: Secondary | ICD-10-CM | POA: Diagnosis not present

## 2020-01-21 ENCOUNTER — Telehealth (HOSPITAL_COMMUNITY): Payer: Self-pay | Admitting: *Deleted

## 2020-01-23 ENCOUNTER — Other Ambulatory Visit: Payer: Self-pay | Admitting: Physician Assistant

## 2020-01-27 DIAGNOSIS — S46001D Unspecified injury of muscle(s) and tendon(s) of the rotator cuff of right shoulder, subsequent encounter: Secondary | ICD-10-CM | POA: Diagnosis not present

## 2020-02-02 NOTE — Telephone Encounter (Signed)
Will route to Dr. Rayann Heman to make him aware of patient's tick bite in case this impacts plans for ICD.

## 2020-02-07 ENCOUNTER — Other Ambulatory Visit: Payer: Medicare Other | Admitting: *Deleted

## 2020-02-07 ENCOUNTER — Other Ambulatory Visit (HOSPITAL_COMMUNITY): Payer: Medicare Other

## 2020-02-07 ENCOUNTER — Other Ambulatory Visit: Payer: Self-pay

## 2020-02-07 DIAGNOSIS — I5022 Chronic systolic (congestive) heart failure: Secondary | ICD-10-CM

## 2020-02-07 DIAGNOSIS — I447 Left bundle-branch block, unspecified: Secondary | ICD-10-CM

## 2020-02-07 DIAGNOSIS — I2510 Atherosclerotic heart disease of native coronary artery without angina pectoris: Secondary | ICD-10-CM

## 2020-02-07 DIAGNOSIS — I420 Dilated cardiomyopathy: Secondary | ICD-10-CM

## 2020-02-07 DIAGNOSIS — I255 Ischemic cardiomyopathy: Secondary | ICD-10-CM

## 2020-02-07 LAB — CBC WITH DIFFERENTIAL/PLATELET
Basophils Absolute: 0.1 10*3/uL (ref 0.0–0.2)
Basos: 2 %
EOS (ABSOLUTE): 0.6 10*3/uL — ABNORMAL HIGH (ref 0.0–0.4)
Eos: 14 %
Hematocrit: 39.4 % (ref 37.5–51.0)
Hemoglobin: 13.2 g/dL (ref 13.0–17.7)
Lymphocytes Absolute: 1.3 10*3/uL (ref 0.7–3.1)
Lymphs: 31 %
MCH: 25.7 pg — ABNORMAL LOW (ref 26.6–33.0)
MCHC: 33.5 g/dL (ref 31.5–35.7)
MCV: 77 fL — ABNORMAL LOW (ref 79–97)
Monocytes Absolute: 0.3 10*3/uL (ref 0.1–0.9)
Monocytes: 7 %
Neutrophils Absolute: 1.9 10*3/uL (ref 1.4–7.0)
Neutrophils: 46 %
Platelets: 189 10*3/uL (ref 150–450)
RBC: 5.14 x10E6/uL (ref 4.14–5.80)
RDW: 17.6 % — ABNORMAL HIGH (ref 11.6–15.4)
WBC: 4.1 10*3/uL (ref 3.4–10.8)

## 2020-02-07 LAB — BASIC METABOLIC PANEL
BUN/Creatinine Ratio: 15 (ref 10–24)
BUN: 16 mg/dL (ref 8–27)
CO2: 28 mmol/L (ref 20–29)
Calcium: 9.4 mg/dL (ref 8.6–10.2)
Chloride: 105 mmol/L (ref 96–106)
Creatinine, Ser: 1.1 mg/dL (ref 0.76–1.27)
GFR calc Af Amer: 78 mL/min/{1.73_m2} (ref >59)
GFR calc non Af Amer: 68 mL/min/{1.73_m2} (ref >59)
Glucose: 190 mg/dL — ABNORMAL HIGH (ref 65–99)
Potassium: 4.2 mmol/L (ref 3.5–5.2)
Sodium: 139 mmol/L (ref 134–144)

## 2020-02-11 ENCOUNTER — Ambulatory Visit (HOSPITAL_COMMUNITY)
Admission: RE | Disposition: A | Discharge status: Home / Self Care | Payer: Self-pay | Source: Home / Self Care | Attending: Internal Medicine

## 2020-02-11 ENCOUNTER — Other Ambulatory Visit: Payer: Self-pay

## 2020-02-11 ENCOUNTER — Ambulatory Visit (HOSPITAL_COMMUNITY)
Admission: RE | Admit: 2020-02-11 | Discharge: 2020-02-12 | Disposition: A | Discharge status: Home / Self Care | Payer: Medicare Other | Attending: Internal Medicine | Admitting: Internal Medicine

## 2020-02-11 DIAGNOSIS — I255 Ischemic cardiomyopathy: Secondary | ICD-10-CM | POA: Diagnosis present

## 2020-02-11 DIAGNOSIS — Z85118 Personal history of other malignant neoplasm of bronchus and lung: Secondary | ICD-10-CM | POA: Diagnosis not present

## 2020-02-11 DIAGNOSIS — I2510 Atherosclerotic heart disease of native coronary artery without angina pectoris: Secondary | ICD-10-CM | POA: Diagnosis not present

## 2020-02-11 DIAGNOSIS — Z888 Allergy status to other drugs, medicaments and biological substances status: Secondary | ICD-10-CM | POA: Diagnosis not present

## 2020-02-11 DIAGNOSIS — Z95810 Presence of automatic (implantable) cardiac defibrillator: Secondary | ICD-10-CM

## 2020-02-11 DIAGNOSIS — E785 Hyperlipidemia, unspecified: Secondary | ICD-10-CM | POA: Diagnosis not present

## 2020-02-11 DIAGNOSIS — I130 Hypertensive heart and chronic kidney disease with heart failure and stage 1 through stage 4 chronic kidney disease, or unspecified chronic kidney disease: Secondary | ICD-10-CM | POA: Diagnosis not present

## 2020-02-11 DIAGNOSIS — E1122 Type 2 diabetes mellitus with diabetic chronic kidney disease: Secondary | ICD-10-CM | POA: Insufficient documentation

## 2020-02-11 DIAGNOSIS — G4733 Obstructive sleep apnea (adult) (pediatric): Secondary | ICD-10-CM | POA: Insufficient documentation

## 2020-02-11 DIAGNOSIS — Z959 Presence of cardiac and vascular implant and graft, unspecified: Secondary | ICD-10-CM

## 2020-02-11 DIAGNOSIS — Z8673 Personal history of transient ischemic attack (TIA), and cerebral infarction without residual deficits: Secondary | ICD-10-CM | POA: Insufficient documentation

## 2020-02-11 DIAGNOSIS — I447 Left bundle-branch block, unspecified: Secondary | ICD-10-CM | POA: Diagnosis not present

## 2020-02-11 DIAGNOSIS — Z951 Presence of aortocoronary bypass graft: Secondary | ICD-10-CM | POA: Diagnosis not present

## 2020-02-11 DIAGNOSIS — M1990 Unspecified osteoarthritis, unspecified site: Secondary | ICD-10-CM | POA: Insufficient documentation

## 2020-02-11 DIAGNOSIS — Z952 Presence of prosthetic heart valve: Secondary | ICD-10-CM | POA: Diagnosis not present

## 2020-02-11 DIAGNOSIS — I252 Old myocardial infarction: Secondary | ICD-10-CM | POA: Diagnosis not present

## 2020-02-11 DIAGNOSIS — I5022 Chronic systolic (congestive) heart failure: Secondary | ICD-10-CM | POA: Insufficient documentation

## 2020-02-11 DIAGNOSIS — Z20822 Contact with and (suspected) exposure to covid-19: Secondary | ICD-10-CM | POA: Insufficient documentation

## 2020-02-11 DIAGNOSIS — Z006 Encounter for examination for normal comparison and control in clinical research program: Secondary | ICD-10-CM | POA: Insufficient documentation

## 2020-02-11 DIAGNOSIS — Z955 Presence of coronary angioplasty implant and graft: Secondary | ICD-10-CM | POA: Insufficient documentation

## 2020-02-11 DIAGNOSIS — Z85820 Personal history of malignant melanoma of skin: Secondary | ICD-10-CM | POA: Diagnosis not present

## 2020-02-11 DIAGNOSIS — Z794 Long term (current) use of insulin: Secondary | ICD-10-CM | POA: Diagnosis not present

## 2020-02-11 DIAGNOSIS — N183 Chronic kidney disease, stage 3 unspecified: Secondary | ICD-10-CM | POA: Insufficient documentation

## 2020-02-11 DIAGNOSIS — Z79890 Hormone replacement therapy (postmenopausal): Secondary | ICD-10-CM | POA: Insufficient documentation

## 2020-02-11 DIAGNOSIS — Z79899 Other long term (current) drug therapy: Secondary | ICD-10-CM | POA: Diagnosis not present

## 2020-02-11 DIAGNOSIS — Z7982 Long term (current) use of aspirin: Secondary | ICD-10-CM | POA: Diagnosis not present

## 2020-02-11 HISTORY — PX: BIV ICD INSERTION CRT-D: EP1195

## 2020-02-11 HISTORY — DX: Presence of automatic (implantable) cardiac defibrillator: Z95.810

## 2020-02-11 LAB — GLUCOSE, CAPILLARY
Glucose-Capillary: 95 mg/dL (ref 70–99)
Glucose-Capillary: 126 mg/dL — ABNORMAL HIGH (ref 70–99)
Glucose-Capillary: 298 mg/dL — ABNORMAL HIGH (ref 70–99)

## 2020-02-11 SURGERY — BIV ICD INSERTION CRT-D

## 2020-02-11 MED ORDER — HYDROCODONE-ACETAMINOPHEN 5-325 MG PO TABS
1.0000 | ORAL_TABLET | ORAL | Status: DC | PRN
  Administered 2020-02-12 (×2): 1 via ORAL
  Filled 2020-02-11 (×2): qty 1

## 2020-02-11 MED ORDER — LIDOCAINE HCL 1 % IJ SOLN
INTRAMUSCULAR | Status: AC
  Filled 2020-02-11: qty 20

## 2020-02-11 MED ORDER — INSULIN DETEMIR 100 UNIT/ML ~~LOC~~ SOLN
18.0000 [IU] | Freq: Every day | SUBCUTANEOUS | Status: DC
  Administered 2020-02-12: 18 [IU] via SUBCUTANEOUS
  Filled 2020-02-11: qty 0.18

## 2020-02-11 MED ORDER — INSULIN DETEMIR 100 UNIT/ML ~~LOC~~ SOLN
10.0000 [IU] | Freq: Every day | SUBCUTANEOUS | Status: DC
  Administered 2020-02-11: 10 [IU] via SUBCUTANEOUS
  Filled 2020-02-11 (×2): qty 0.1

## 2020-02-11 MED ORDER — MIDAZOLAM HCL 5 MG/5ML IJ SOLN
INTRAMUSCULAR | Status: AC
  Filled 2020-02-11: qty 5

## 2020-02-11 MED ORDER — SODIUM CHLORIDE 0.9 % IV SOLN
INTRAVENOUS | Status: AC
  Filled 2020-02-11: qty 2

## 2020-02-11 MED ORDER — LOSARTAN POTASSIUM 25 MG PO TABS
25.0000 mg | ORAL_TABLET | Freq: Every day | ORAL | Status: DC
  Administered 2020-02-12: 25 mg via ORAL
  Filled 2020-02-11: qty 1

## 2020-02-11 MED ORDER — FUROSEMIDE 20 MG PO TABS
20.0000 mg | ORAL_TABLET | Freq: Every day | ORAL | Status: DC
  Administered 2020-02-12: 20 mg via ORAL
  Filled 2020-02-11: qty 1

## 2020-02-11 MED ORDER — AMITRIPTYLINE HCL 25 MG PO TABS
25.0000 mg | ORAL_TABLET | Freq: Every day | ORAL | Status: DC
  Administered 2020-02-11: 25 mg via ORAL
  Filled 2020-02-11: qty 1

## 2020-02-11 MED ORDER — HEPARIN (PORCINE) IN NACL 1000-0.9 UT/500ML-% IV SOLN
INTRAVENOUS | Status: DC | PRN
  Administered 2020-02-11: 500 mL

## 2020-02-11 MED ORDER — CEFAZOLIN SODIUM-DEXTROSE 2-4 GM/100ML-% IV SOLN
INTRAVENOUS | Status: AC
  Filled 2020-02-11: qty 100

## 2020-02-11 MED ORDER — MIDAZOLAM HCL 5 MG/5ML IJ SOLN
INTRAMUSCULAR | Status: DC | PRN
  Administered 2020-02-11 (×2): 2 mg via INTRAVENOUS
  Administered 2020-02-11: 1 mg via INTRAVENOUS

## 2020-02-11 MED ORDER — ACETAMINOPHEN 325 MG PO TABS: 325.0000 mg | ORAL_TABLET | ORAL | Status: DC | PRN

## 2020-02-11 MED ORDER — SODIUM CHLORIDE 0.9% FLUSH: 3.0000 mL | INTRAVENOUS | Status: DC | PRN

## 2020-02-11 MED ORDER — ASPIRIN 81 MG PO CHEW
81.0000 mg | CHEWABLE_TABLET | Freq: Every day | ORAL | Status: DC
  Filled 2020-02-11: qty 1

## 2020-02-11 MED ORDER — LIDOCAINE HCL 1 % IJ SOLN
INTRAMUSCULAR | Status: AC
  Filled 2020-02-11: qty 60

## 2020-02-11 MED ORDER — FENTANYL CITRATE (PF) 100 MCG/2ML IJ SOLN
INTRAMUSCULAR | Status: DC | PRN
  Administered 2020-02-11 (×3): 25 ug via INTRAVENOUS

## 2020-02-11 MED ORDER — SODIUM CHLORIDE 0.9 % IV SOLN: INTRAVENOUS | Status: DC

## 2020-02-11 MED ORDER — FENTANYL CITRATE (PF) 100 MCG/2ML IJ SOLN
INTRAMUSCULAR | Status: AC
  Filled 2020-02-11: qty 2

## 2020-02-11 MED ORDER — IVABRADINE HCL 5 MG PO TABS
5.0000 mg | ORAL_TABLET | Freq: Two times a day (BID) | ORAL | Status: DC
  Administered 2020-02-12: 5 mg via ORAL
  Filled 2020-02-11: qty 1

## 2020-02-11 MED ORDER — LEVOTHYROXINE SODIUM 50 MCG PO TABS
50.0000 ug | ORAL_TABLET | Freq: Every day | ORAL | Status: DC
  Administered 2020-02-12: 50 ug via ORAL
  Filled 2020-02-11: qty 1

## 2020-02-11 MED ORDER — ZOLPIDEM TARTRATE 5 MG PO TABS
2.5000 mg | ORAL_TABLET | Freq: Every day | ORAL | Status: DC
  Administered 2020-02-11: 2.5 mg via ORAL
  Filled 2020-02-11: qty 1

## 2020-02-11 MED ORDER — LIDOCAINE HCL (PF) 1 % IJ SOLN
INTRAMUSCULAR | Status: DC | PRN
  Administered 2020-02-11: 50 mL

## 2020-02-11 MED ORDER — CEFAZOLIN SODIUM-DEXTROSE 2-4 GM/100ML-% IV SOLN
2.0000 g | INTRAVENOUS | Status: AC
  Administered 2020-02-11: 2 g via INTRAVENOUS

## 2020-02-11 MED ORDER — CEFAZOLIN SODIUM-DEXTROSE 1-4 GM/50ML-% IV SOLN
1.0000 g | Freq: Four times a day (QID) | INTRAVENOUS | Status: AC
  Administered 2020-02-11 – 2020-02-12 (×3): 1 g via INTRAVENOUS
  Filled 2020-02-11 (×3): qty 50

## 2020-02-11 MED ORDER — METOPROLOL SUCCINATE ER 50 MG PO TB24
75.0000 mg | ORAL_TABLET | Freq: Every day | ORAL | Status: DC
  Administered 2020-02-12: 75 mg via ORAL
  Filled 2020-02-11: qty 1

## 2020-02-11 MED ORDER — SODIUM CHLORIDE 0.9 % IV SOLN: 250.0000 mL | INTRAVENOUS | Status: DC | PRN

## 2020-02-11 MED ORDER — SODIUM CHLORIDE 0.9 % IV SOLN
80.0000 mg | INTRAVENOUS | Status: AC
  Administered 2020-02-11: 80 mg

## 2020-02-11 MED ORDER — INSULIN ASPART 100 UNIT/ML ~~LOC~~ SOLN
0.0000 [IU] | Freq: Three times a day (TID) | SUBCUTANEOUS | Status: DC
  Administered 2020-02-12: 2 [IU] via SUBCUTANEOUS

## 2020-02-11 MED ORDER — CHLORHEXIDINE GLUCONATE 4 % EX LIQD
4.0000 "application " | Freq: Once | CUTANEOUS | Status: DC
  Filled 2020-02-11: qty 60

## 2020-02-11 MED ORDER — INSULIN ASPART 100 UNIT/ML ~~LOC~~ SOLN
0.0000 [IU] | Freq: Every day | SUBCUTANEOUS | Status: DC
  Administered 2020-02-11: 3 [IU] via SUBCUTANEOUS

## 2020-02-11 MED ORDER — INSULIN DETEMIR 100 UNIT/ML ~~LOC~~ SOLN: 10.0000 [IU] | SUBCUTANEOUS | Status: DC

## 2020-02-11 MED ORDER — SODIUM CHLORIDE 0.9% FLUSH
3.0000 mL | Freq: Two times a day (BID) | INTRAVENOUS | Status: DC
  Administered 2020-02-11 – 2020-02-12 (×2): 3 mL via INTRAVENOUS

## 2020-02-11 MED ORDER — IOHEXOL 350 MG/ML SOLN
INTRAVENOUS | Status: DC | PRN
  Administered 2020-02-11: 15 mL
  Administered 2020-02-11: 25 mL

## 2020-02-11 MED ORDER — ONDANSETRON HCL 4 MG/2ML IJ SOLN: 4.0000 mg | Freq: Four times a day (QID) | INTRAMUSCULAR | Status: DC | PRN

## 2020-02-11 SURGICAL SUPPLY — 15 items
CABLE SURGICAL S-101-97-12 (CABLE) ×3 IMPLANT
CATH ATTAIN COM SURV 6250V-EH (CATHETERS) ×3 IMPLANT
CATH ATTAIN COM SURV 6250V-MB2 (CATHETERS) ×3 IMPLANT
CATH HEX JOS 2-5-2 65CM 6F REP (CATHETERS) ×3 IMPLANT
ICD UNIFY ASUR CRT CD3357-40Q (ICD Generator) ×3 IMPLANT
LEAD DURATA 7122Q-65CM (Lead) ×3 IMPLANT
LEAD TENDRIL MRI 52CM LPA1200M (Lead) ×3 IMPLANT
PAD PRO RADIOLUCENT 2001M-C (PAD) ×3 IMPLANT
SHEATH 7FR PRELUDE SNAP 13 (SHEATH) ×3 IMPLANT
SHEATH 8FR PRELUDE SNAP 13 (SHEATH) ×3 IMPLANT
SHEATH 9.5FR PRELUDE SNAP 13 (SHEATH) ×3 IMPLANT
TRAY PACEMAKER INSERTION (PACKS) ×3 IMPLANT
WIRE ACUITY WHISPER EDS 4648 (WIRE) ×3 IMPLANT
WIRE HI TORQ VERSACORE-J 145CM (WIRE) ×3 IMPLANT
WIRE RUNTHROUGH .014X180CM (WIRE) ×3 IMPLANT

## 2020-02-11 NOTE — Progress Notes (Signed)
Pt leaves cath lab holding area in stable condition. Lt chest dressing is CDI.Family updated.

## 2020-02-11 NOTE — H&P (Signed)
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 male who presents today for BiV ICD implantation.  Since last being seen in our clinic, the patient reports doing reasonably well.  He reports SOB with walking 100 yards but is active.  He mows his own lawn.  Today, he denies symptoms of palpitations, chest pain, lower extremity edema, dizziness, presyncope, or syncope.  The patient is otherwise without complaint today.       Past Medical History:  Diagnosis Date  . Anemia   . Arthritis   . Atrial tachycardia ([place])   . Chronic systolic CHF (congestive heart failure) ([place]) 07/04/2018  . CKD (chronic kidney disease), stage III   . Coronary artery disease 02/2012   a. s/p stenting in 2013  b. s/p CABGx2V (SVG--> PDA, SVG--> LCx).  . Diabetes mellitus    neuropathy  insulin dependent  . Dilated aortic root ([place])   . Dyslipidemia   . Erectile dysfunction   . GERD (gastroesophageal reflux disease)   . History of CVA (cerebrovascular accident)   . History of kidney stones   . Hx of radiation therapy to mediastinum 1985  . Hypertension   . 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 OSA (obstructive sleep apnea) 07/04/2018   Severe obstructive sleep apnea with an AHI of 30/h and mild central sleep apnea at 13.7/h with oxygen desaturations as low as 79%.  Intolerant to PAP therapy  . S/P TAVR (transcatheter aortic valve replacement) 05/15/2018   29 mm Edwards Sapien 3 transcatheter heart valve placed via percutaneous right transfemoral approach   . Severe aortic stenosis         Past Surgical History:  Procedure Laterality Date  . COLONOSCOPY  07/25/2012   Procedure: COLONOSCOPY;  Surgeon: Winfield Cunas., MD;  Location: [facility] ENDOSCOPY;  Service: Endoscopy;  Laterality: N/A;  . COLONOSCOPY N/A 12/02/2013   Procedure: COLONOSCOPY;  Surgeon: Winfield Cunas., MD;  Location: [facility] ENDOSCOPY;  Service: Endoscopy;  Laterality: N/A;  . CORONARY ARTERY BYPASS GRAFT N/A  01/04/2013   Procedure: CORONARY ARTERY BYPASS GRAFTING (CABG) times two using right saphenous vein harvested with endoscope.;  Surgeon: Ivin Poot, MD;  Location: [place];  Service: Open Heart Surgery;  Laterality: N/A;  . ESOPHAGOGASTRODUODENOSCOPY  07/20/2012   Procedure: ESOPHAGOGASTRODUODENOSCOPY (EGD);  Surgeon: Winfield Cunas., MD;  Location: [facility] ENDOSCOPY;  Service: Endoscopy;  Laterality: N/A;  . ESOPHAGOGASTRODUODENOSCOPY N/A 12/02/2013   Procedure: ESOPHAGOGASTRODUODENOSCOPY (EGD);  Surgeon: Winfield Cunas., MD;  Location: [facility] ENDOSCOPY;  Service: Endoscopy;  Laterality: N/A;  . HOT HEMOSTASIS N/A 12/02/2013   Procedure: HOT HEMOSTASIS (ARGON PLASMA COAGULATION/BICAP);  Surgeon: Winfield Cunas., MD;  Location: [facility] ENDOSCOPY;  Service: Endoscopy;  Laterality: N/A;  . INTRAOPERATIVE TRANSESOPHAGEAL ECHOCARDIOGRAM N/A 01/04/2013   Procedure: INTRAOPERATIVE TRANSESOPHAGEAL ECHOCARDIOGRAM;  Surgeon: Ivin Poot, MD;  Location: [place];  Service: Open Heart Surgery;  Laterality: N/A;  . INTRAOPERATIVE TRANSTHORACIC ECHOCARDIOGRAM N/A 05/15/2018   Procedure: INTRAOPERATIVE TRANSTHORACIC ECHOCARDIOGRAM;  Surgeon: Burnell Blanks, MD;  Location: [place];  Service: Open Heart Surgery;  Laterality: N/A;  . IR THORACENTESIS ASP PLEURAL SPACE W/IMG GUIDE  10/19/2018  . LEFT HEART CATHETERIZATION WITH CORONARY ANGIOGRAM N/A 03/06/2012   Procedure: LEFT HEART CATHETERIZATION WITH CORONARY ANGIOGRAM;  Surgeon: [name], MD;  Location: [place] CATH LAB;  Service: Cardiovascular;  Laterality: N/A;  . LITHOTRIPSY    . [place]  . RADIAL ARTERY HARVEST Left  01/04/2013   Procedure: RADIAL ARTERY HARVEST;  Surgeon: Ivin Poot, MD;  Location: [place];  Service: Vascular;  Laterality: Left;  Artery not havested. Unsuitable for use.  . resection mediastinal seminonma    . RIGHT/LEFT HEART CATH AND CORONARY/GRAFT ANGIOGRAPHY N/A 11/15/2016   Procedure: Right/Left Heart  Cath and Coronary/Graft Angiography;  Surgeon: Leonie Man, MD;  Location: [place] CV LAB;  Service: Cardiovascular;  Laterality: N/A;  . RIGHT/LEFT HEART CATH AND CORONARY/GRAFT ANGIOGRAPHY N/A 04/13/2018   Procedure: RIGHT/LEFT HEART CATH AND CORONARY/GRAFT ANGIOGRAPHY;  Surgeon: Jolaine Artist, MD;  Location: [place] CV LAB;  Service: Cardiovascular;  Laterality: N/A;  . TRANSCATHETER AORTIC VALVE REPLACEMENT, TRANSFEMORAL  05/15/2018  . TRANSCATHETER AORTIC VALVE REPLACEMENT, TRANSFEMORAL N/A 05/15/2018   Procedure: TRANSCATHETER AORTIC VALVE REPLACEMENT, TRANSFEMORAL;  Surgeon: Burnell Blanks, MD;  Location: [place];  Service: Open Heart Surgery;  Laterality: N/A;    ROS- all systems are reviewed and negatives except as per HPI above        Current Outpatient Medications  Medication Sig Dispense Refill  . amitriptyline (ELAVIL) 25 MG tablet Take 25 mg by mouth at bedtime.     Marland Kitchen amoxicillin (AMOXIL) 500 MG capsule Take 1 capsule (500 mg total) by mouth as directed. Take 4 tablets (2000 mg ) 1 hour prior to dental procedures. 12 capsule 12  . aspirin 81 MG chewable tablet Chew 1 tablet (81 mg total) by mouth daily.    . B Complex-C (SUPER B COMPLEX PO) Take 1 tablet by mouth daily.    . B-D ULTRAFINE III SHORT PEN 31G X 8 MM MISC 1 each by Other route daily as needed (GLUCOSE TESTING (INSULINE NEEDLE)).     . Cholecalciferol (VITAMIN D-3) 5000 units TABS Take 5,000 Units by mouth daily.    . furosemide (LASIX) 20 MG tablet Take 1 tablet (20 mg total) by mouth daily. 90 tablet 3  . insulin detemir (LEVEMIR) 100 UNIT/ML injection Inject 40 Units into the skin every morning. 20u in the AM 10u at night    . insulin lispro (HUMALOG) 100 UNIT/ML KwikPen as needed.    . ivabradine (CORLANOR) 5 MG TABS tablet Take 1 tablet (5 mg total) by mouth 2 (two) times daily with a meal. 60 tablet 3  . levothyroxine (SYNTHROID, LEVOTHROID) 50 MCG tablet Take 50 mcg by  mouth daily before breakfast.     . losartan (COZAAR) 25 MG tablet Take 0.5 tablets (12.5 mg total) by mouth daily. 45 tablet 3  . metFORMIN (GLUCOPHAGE) 500 MG tablet Take 2 tablets (1,000 mg total) by mouth 2 (two) times daily with a meal.    . metoprolol succinate (TOPROL-XL) 25 MG 24 hr tablet Take 3 tablets (75 mg total) by mouth daily. Take with or immediately following a meal. (Patient taking differently: Take 50 mg by mouth daily. Take with or immediately following a meal.) 270 tablet 3  . Multiple Vitamin (MULTIVITAMIN) tablet Take 1 tablet by mouth daily.    . ONE TOUCH ULTRA TEST test strip 1 each by Other route as needed for other.     . simvastatin (ZOCOR) 20 MG tablet Take 20 mg by mouth every evening.     . zolpidem (AMBIEN) 10 MG tablet Take 10 mg by mouth at bedtime.     Marland Kitchen zolpidem (AMBIEN) 5 MG tablet Take 5 mg by mouth at bedtime as needed for sleep.     No current facility-administered medications for this visit.  Physical Exam: Vitals:   02/11/20 1157  BP: 128/86  Pulse: 85  Resp: 17  Temp: (!) 97.3 F (36.3 C)  TempSrc: Oral  SpO2: 100%  Weight: 68.5 kg  Height: 5\' 11"  (1.803 m)    GEN- The patient is well appearing, alert and oriented x 3 today.  Head- normocephalic, atraumatic Eyes-  Sclera clear, conjunctiva pink Ears- hearing intact Oropharynx- clear Neck- supple, Lungs- , normal work of breathing Heart- Regular rate and rhythm  GI- soft,  Extremities- no clubbing, cyanosis, or edema, groin is without hematoma/ bruit MS- no significant deformity or atrophy Skin- no rash or lesion Psych- euthymic mood, full affect Neuro- strength and sensation are intact      Wt Readings from Last 3 Encounters:  01/08/20 154 lb 9.6 oz (70.1 kg)  12/05/19 160 lb 12.8 oz (72.9 kg)  10/28/19 158 lb 6.4 oz (71.8 kg)   Echo 12/23/19- EF <20%, mild to moderate MR EKG tracing ordered today is personally reviewed and shows sinus rhythm 84 bpm, PR 222  msec, LBBB with QRS 146 msec  Assessment and Plan:  1. Chronic systolic dysfunction/ ischemic CM/ LBBB He has NYHA Class III CHF The patient has an ischemic CM (EF 20%), NYHA Class III CHF, and CAD.  He is referred by Dr Radford Pax for risk stratification of sudden death and consideration of ICD implantation.  At this time, he meets MADIT II/ SCD-HeFT criteria for ICD implantation for primary prevention of sudden death.  He has QRS > 130 msec with LBBB and therefore has IIa indication for CRT.  I have had a thorough discussion with the patient and his wife reviewing options.  We discussed CRT-P and CRT-D options. The patient has had opportunities to ask questions and have them answered. The patient and I have decided together through a shared decision making process to proceed with BiV ICD at this time.     ICD Criteria  Current LVEF:20%. Within 12 months prior to implant: Yes   Heart failure history: Yes, Class III  Cardiomyopathy history: Yes, Ischemic Cardiomyopathy - Prior MI.  Atrial Fibrillation/Atrial Flutter: No.  Ventricular tachycardia history: No.  Cardiac arrest history: No.  History of syndromes with risk of sudden death: No.  Previous ICD: No.  Current ICD indication: Primary  PPM indication:  BiV pacing  Class I or II Bradycardia indication present: No  Beta Blocker therapy for 3 or more months: Yes, prescribed.   Ace Inhibitor/ARB therapy for 3 or more months: Yes, prescribed.    Risks, benefits, alternatives to BiV ICD implantation were discussed in detail with the patient today. The patient  understands that the risks include but are not limited to bleeding, infection, pneumothorax, perforation, tamponade, vascular damage, renal failure, MI, stroke, death, inappropriate shocks, and lead dislodgement and wishes to proceed.    Thompson Grayer MD, [place] 02/11/2020 2:47 PM

## 2020-02-12 ENCOUNTER — Telehealth: Payer: Self-pay

## 2020-02-12 ENCOUNTER — Encounter (HOSPITAL_COMMUNITY): Payer: Self-pay | Admitting: Internal Medicine

## 2020-02-12 ENCOUNTER — Other Ambulatory Visit: Payer: Self-pay

## 2020-02-12 ENCOUNTER — Ambulatory Visit (HOSPITAL_COMMUNITY): Payer: Medicare Other

## 2020-02-12 DIAGNOSIS — E1122 Type 2 diabetes mellitus with diabetic chronic kidney disease: Secondary | ICD-10-CM | POA: Diagnosis not present

## 2020-02-12 DIAGNOSIS — Z006 Encounter for examination for normal comparison and control in clinical research program: Secondary | ICD-10-CM | POA: Diagnosis not present

## 2020-02-12 DIAGNOSIS — I130 Hypertensive heart and chronic kidney disease with heart failure and stage 1 through stage 4 chronic kidney disease, or unspecified chronic kidney disease: Secondary | ICD-10-CM | POA: Diagnosis not present

## 2020-02-12 DIAGNOSIS — Z20822 Contact with and (suspected) exposure to covid-19: Secondary | ICD-10-CM | POA: Diagnosis not present

## 2020-02-12 DIAGNOSIS — I255 Ischemic cardiomyopathy: Secondary | ICD-10-CM | POA: Diagnosis not present

## 2020-02-12 DIAGNOSIS — J90 Pleural effusion, not elsewhere classified: Secondary | ICD-10-CM | POA: Diagnosis not present

## 2020-02-12 DIAGNOSIS — I447 Left bundle-branch block, unspecified: Secondary | ICD-10-CM | POA: Diagnosis not present

## 2020-02-12 LAB — SARS CORONAVIRUS 2 BY RT PCR (HOSPITAL ORDER, PERFORMED IN ~~LOC~~ HOSPITAL LAB): SARS Coronavirus 2: NEGATIVE

## 2020-02-12 LAB — GLUCOSE, CAPILLARY: Glucose-Capillary: 132 mg/dL — ABNORMAL HIGH (ref 70–99)

## 2020-02-12 MED FILL — Lidocaine HCl Local Inj 1%: INTRAMUSCULAR | Qty: 20 | Status: AC

## 2020-02-12 MED FILL — Lidocaine HCl Local Inj 1%: INTRAMUSCULAR | Qty: 60 | Status: AC

## 2020-02-12 NOTE — Telephone Encounter (Signed)
Phone note not needed

## 2020-02-12 NOTE — Discharge Summary (Addendum)
ELECTROPHYSIOLOGY PROCEDURE DISCHARGE SUMMARY    Patient ID: [name],  MRN: [id number], DOB/AGE: [birth year] 69 y.o.  Admit date: 02/11/2020 Discharge date: 02/12/2020  Primary Care Physician: [name], MD  Primary Cardiologist: Dr. Radford Pax Electrophysiologist: Dr. Rayann Heman  Primary Discharge Diagnosis:  1. ICM 2. Chronic CHF 3. LBBB  Secondary Discharge Diagnosis:  1. CAD 2. Chronic CHF (systolic) 3. CKD (III) 4. DM 5. HTN     Hypertensive heart disease 6. OSA      W/CPAP 7. VHD, AS     S/p TAVR 8. H/o Lung Ca     And malignant melanoma mediastinum  Allergies  Allergen Reactions  . Lipitor [Atorvastatin] Other (See Comments)    Muscle pain  . Adhesive [Tape] Rash     Procedures This Admission:  1.  Implantation of a SJM dual chamber ICD on 02/11/2020 by Dr Rayann Heman.  The patient received a [place] model CD3357-40Q (serial  Number [id number]) biventricular ICD, [employer] Tendril MRI model LPA1200M-  52 (serial number  [id number]) right atrial lead and a [employer] [place], model 7122Q-65 (serial number [id number]) right ventricular defibrillator lead  Attempts at LV lead placement were unsuccessful DFT's were deferred at time of implant 2.  CXR on 02/12/2020 demonstrated no pneumothorax status post device implantation.   Brief HPI: [name] is a 69 y.o. male was referred to electrophysiology in the outpatient setting for consideration of CRT-ICD implantation.  Past medical history includes above.  The patient has persistent LV dysfunction despite guideline directed therapy.  Risks, benefits, and alternatives to ICD implantation were reviewed with the patient who wished to proceed.   Hospital Course:  The patient was admitted and underwent implantation of a dual chamber ICD, unfortunately despite significant efforts unable to place CS/LC lead (see full report for details).   He was monitored on telemetry overnight which  demonstrated SR/ST 90's-100s generally, felt to be his baseline, noting he is on corlanor.  Left chest was without hematoma or ecchymosis.  The device was interrogated and found to be functioning normally.  CXR was obtained and demonstrated no pneumothorax status post device implantation.  Wound care, arm mobility, and restrictions were reviewed with the patient.  The patient was examined and considered stable for discharge to home.   The patient's discharge medications include an ACE/ARB (losartan) and beta blocker (metoprolol).   Pressure dressing was removed gently he tolerated well has small and superficial skin tear shoulder and just inferior to site no bleeding, he has a very small hematoma inferior aspect of site, instructed no ASA for 2 days   Physical Exam: Vitals:   02/12/20 0218 02/12/20 0418 02/12/20 0738 02/12/20 0900  BP: 117/79 113/71 112/76   Pulse: (!) 102 99 98 98  Resp: 16 16 18    Temp: 99 F (37.2 C) 98.5 F (36.9 C) 98 F (36.7 C)   TempSrc: Oral Oral Oral   SpO2: 98% 100% 100%   Weight:  70.1 kg    Height:  5\' 11"  (1.803 m)      GEN- The patient is well appearing, alert and oriented x 3 today.   HEENT: normocephalic, atraumatic; sclera clear, conjunctiva pink; hearing intact; oropharynx clear Lungs- Clear to ausculation bilaterally, normal work of breathing.  No wheezes, rales, rhonchi Heart- RRR, soft SM, no rubs or gallops, PMI not laterally displaced GI- soft, non-tender, non-distended, bowel sounds present Extremities- no clubbing, cyanosis, or edema MS- no significant  deformity or atrophy Skin- warm and dry, no rash or lesion, left chest with slight hematoma inferior aspect, no bleeding or ecchymosis Psych- euthymic mood, full affect Neuro- no gross defecits  Labs:   Lab Results  Component Value Date   WBC 4.1 02/07/2020   HGB 13.2 02/07/2020   HCT 39.4 02/07/2020   MCV 77 (L) 02/07/2020   PLT 189 02/07/2020    Recent Labs  Lab 02/07/20 0914    NA 139  K 4.2  CL 105  CO2 28  BUN 16  CREATININE 1.10  CALCIUM 9.4  GLUCOSE 190*    Discharge Medications:  Allergies as of 02/12/2020      Reactions   Lipitor [atorvastatin] Other (See Comments)   Muscle pain   Adhesive [tape] Rash      Medication List    TAKE these medications   amitriptyline 25 MG tablet Commonly known as: ELAVIL Take 25 mg by mouth at bedtime.   amoxicillin 500 MG capsule Commonly known as: AMOXIL Take 1 capsule (500 mg total) by mouth as directed. Take 4 tablets (2000 mg ) 1 hour prior to dental procedures. What changed: how much to take   aspirin 81 MG chewable tablet Chew 1 tablet (81 mg total) by mouth daily. Notes to patient: Resume Friday 02/14/2020   B-D ULTRAFINE III SHORT PEN 31G X 8 MM Misc Generic drug: Insulin Pen Needle 1 each by Other route daily as needed (GLUCOSE TESTING (INSULINE NEEDLE)).   Corlanor 5 MG Tabs tablet Generic drug: ivabradine TAKE 1 TABLET(5 MG) BY MOUTH TWICE DAILY WITH A MEAL What changed: See the new instructions.   furosemide 20 MG tablet Commonly known as: LASIX Take 1 tablet (20 mg total) by mouth daily.   insulin lispro 100 UNIT/ML KwikPen Commonly known as: HUMALOG Inject 2-3 Units into the skin See admin instructions. Per sliding scale at Breakfast and Dinner   Levemir 100 UNIT/ML injection Generic drug: insulin detemir Inject 10-18 Units into the skin See admin instructions. 18 units in the morning, 10 units at bedtime   levothyroxine 50 MCG tablet Commonly known as: SYNTHROID Take 50 mcg by mouth daily before breakfast.   losartan 25 MG tablet Commonly known as: COZAAR Take 1 tablet (25 mg total) by mouth daily.   metFORMIN 500 MG tablet Commonly known as: GLUCOPHAGE Take 2 tablets (1,000 mg total) by mouth 2 (two) times daily with a meal.   metoprolol succinate 25 MG 24 hr tablet Commonly known as: TOPROL-XL Take 75 mg by mouth daily.   multivitamin tablet Take 1 tablet by mouth  daily.   ONE TOUCH ULTRA TEST test strip Generic drug: glucose blood 1 each by Other route as needed for other.   simvastatin 20 MG tablet Commonly known as: ZOCOR Take 20 mg by mouth every evening.   SUPER B COMPLEX PO Take 1 tablet by mouth daily.   Vitamin D-3 125 MCG (5000 UT) Tabs Take 5,000 Units by mouth daily.   zolpidem 5 MG tablet Commonly known as: AMBIEN Take 2.5 mg by mouth at bedtime.       Disposition: Home  Discharge Instructions    Diet - low sodium heart healthy   Complete by: As directed    Increase activity slowly   Complete by: As directed       Follow-up Information    [place] [facility] Follow up.   Specialty: Cardiology Why: 02/25/2020 @ 9:00AM, wound check visit Contact information: [place]  [address], [place] [phone]       Thompson Grayer, MD Follow up.   Specialty: Cardiology Why: 05/18/2020 @ 1:45PM Contact information: [place] [address] [phone]               Duration of Discharge Encounter: Greater than 30 minutes including physician time.  Signed, Tommye Standard, PA-C 02/12/2020 9:12 AM    I have seen, examined the patient, and reviewed the above assessment and plan.  Changes to above are made where necessary.  On exam, RRR.  Device interrogation is reviewed and normal.  Co Sign: Thompson Grayer, MD 02/12/2020 10:39 PM

## 2020-02-12 NOTE — Progress Notes (Signed)
   02/11/20 2355  Assess: MEWS Score  Temp 98.7 F (37.1 C)  BP 133/88  Pulse Rate (!) 112  ECG Heart Rate (!) 111  Resp 17  Level of Consciousness Alert  SpO2 100 %  O2 Device Room Air  Assess: MEWS Score  MEWS Temp 0  MEWS Systolic 0  MEWS Pulse 2  MEWS RR 0  MEWS LOC 0  MEWS Score 2  MEWS Score Color Yellow  Assess: if the MEWS score is Yellow or Red  Were vital signs taken at a resting state? Yes  Focused Assessment Documented focused assessment  Early Detection of Sepsis Score *See Row Information* Low  MEWS guidelines implemented *See Row Information* Yes  Treat  MEWS Interventions Administered prn meds/treatments (Administered pain medication, pain at insertion site)  Take Vital Signs  Increase Vital Sign Frequency  Yellow: Q 2hr X 2 then Q 4hr X 2, if remains yellow, continue Q 4hrs  Escalate  MEWS: Escalate Yellow: discuss with charge nurse/RN and consider discussing with provider and RRT  Notify: Charge Nurse/RN  Name of Charge Nurse/RN Notified Jamal Collin, RN  Date Charge Nurse/RN Notified 02/12/20  Time Charge Nurse/RN Notified 0020  Document  Patient Outcome Stabilized after interventions  Progress note created (see row info) Yes   Elaina Hoops, RN

## 2020-02-12 NOTE — Discharge Instructions (Signed)
    Supplemental Discharge Instructions for  Pacemaker/Defibrillator Patients      Activity No heavy lifting or vigorous activity with your left/right arm for 6 to 8 weeks.  Do not raise your left/right arm above your head for one week.  Gradually raise your affected arm as drawn below.             02/16/2020                   02/17/2020                   02/18/2020                02/19/2020 __  NO DRIVING for  1 week   ; you may begin driving on  11/16/345   .  WOUND CARE - Keep the wound area clean and dry.  Do not get this area wet, no showers until cleared to at your wound check visit - The tape/steri-strips on your wound will fall off; do not pull them off.  No bandage is needed on the site.  DO  NOT apply any creams, oils, or ointments to the wound area. - If you notice any drainage or discharge from the wound, any swelling or bruising at the site, or you develop a fever > 101? F after you are discharged home, call the office at once.  Special Instructions - You are still able to use cellular telephones; use the ear opposite the side where you have your pacemaker/defibrillator.  Avoid carrying your cellular phone near your device. - When traveling through airports, show security personnel your identification card to avoid being screened in the metal detectors.  Ask the security personnel to use the hand wand. - Avoid arc welding equipment, MRI testing (magnetic resonance imaging), TENS units (transcutaneous nerve stimulators).  Call the office for questions about other devices. - Avoid electrical appliances that are in poor condition or are not properly grounded. - Microwave ovens are safe to be near or to operate.

## 2020-02-13 ENCOUNTER — Encounter (HOSPITAL_COMMUNITY): Payer: Self-pay | Admitting: Internal Medicine

## 2020-02-13 LAB — HEMOGLOBIN A1C
Hgb A1c MFr Bld: 8.3 % — ABNORMAL HIGH (ref 4.8–5.6)
Mean Plasma Glucose: 192 mg/dL

## 2020-02-25 ENCOUNTER — Ambulatory Visit (INDEPENDENT_AMBULATORY_CARE_PROVIDER_SITE_OTHER): Payer: Medicare Other | Admitting: Emergency Medicine

## 2020-02-25 ENCOUNTER — Other Ambulatory Visit: Payer: Self-pay

## 2020-02-25 DIAGNOSIS — I420 Dilated cardiomyopathy: Secondary | ICD-10-CM

## 2020-02-25 LAB — CUP PACEART INCLINIC DEVICE CHECK
Battery Remaining Longevity: 105 mo
Brady Statistic RA Percent Paced: 0 %
Brady Statistic RV Percent Paced: 0 %
Date Time Interrogation Session: 20210713114541
HighPow Impedance: 52.875
Implantable Lead Implant Date: 20210629
Implantable Lead Implant Date: 20210629
Implantable Lead Location: 753859
Implantable Lead Location: 753860
Implantable Pulse Generator Implant Date: 20210629
Lead Channel Impedance Value: 462.5 Ohm
Lead Channel Impedance Value: 512.5 Ohm
Lead Channel Pacing Threshold Amplitude: 0.5 V
Lead Channel Pacing Threshold Amplitude: 0.75 V
Lead Channel Pacing Threshold Pulse Width: 0.5 ms
Lead Channel Pacing Threshold Pulse Width: 0.5 ms
Lead Channel Sensing Intrinsic Amplitude: 12 mV
Lead Channel Sensing Intrinsic Amplitude: 2.1 mV
Lead Channel Setting Pacing Amplitude: 3.5 V
Lead Channel Setting Pacing Amplitude: 3.5 V
Lead Channel Setting Pacing Pulse Width: 0.5 ms
Lead Channel Setting Sensing Sensitivity: 0.5 mV
Pulse Gen Serial Number: 9932570

## 2020-02-25 NOTE — Progress Notes (Signed)
Wound/Device check appointment. Steri-strips removed. Wound without redness. Small amount of swelling noted over device, soft to palpitation. No drainage, fevers or chills per patient.  Patient advised to wash wound twice daily with anti-bacterial soap, warm water and pat dry with clean wash cloth. Avoid any lotions, ointments, or creams. Please call Device Clinic at [phone] if you see increased swelling, drainage, fever or chills.Consulted with Dr. Rayann Heman, agreeable w/ current plan and care. Incision edges approximated. Normal device function. Thresholds, sensing, and impedances consistent with implant measurements. Device programmed at 3.5V for extra safety margin until 3 month visit. Histogram distribution appropriate for patient and level of activity. No mode switches or ventricular arrhythmias noted. Patient educated about wound care, arm mobility, lifting restrictions, shock plan. ROV in 3 months with implanting physician.

## 2020-02-25 NOTE — Patient Instructions (Signed)
Please wash wound twice daily with anti-bacterial soap, warm water and pat dry with clean wash cloth. Avoid any lotions, ointments, or creams. Please call Device Clinic at [phone] if you see increased swelling, drainage, fever or chills. Instuction given on how to set up home [employer]. Please call device clinic if you are unable to complete this so we can assure you have home remote monitoring.

## 2020-03-02 ENCOUNTER — Ambulatory Visit (INDEPENDENT_AMBULATORY_CARE_PROVIDER_SITE_OTHER): Payer: Medicare Other | Admitting: Cardiology

## 2020-03-02 ENCOUNTER — Encounter: Payer: Self-pay | Admitting: Cardiology

## 2020-03-02 ENCOUNTER — Other Ambulatory Visit: Payer: Self-pay

## 2020-03-02 VITALS — BP 116/66 | HR 80 | Ht 71.0 in | Wt 155.8 lb

## 2020-03-02 DIAGNOSIS — I2510 Atherosclerotic heart disease of native coronary artery without angina pectoris: Secondary | ICD-10-CM | POA: Diagnosis not present

## 2020-03-02 DIAGNOSIS — E785 Hyperlipidemia, unspecified: Secondary | ICD-10-CM

## 2020-03-02 DIAGNOSIS — I5022 Chronic systolic (congestive) heart failure: Secondary | ICD-10-CM

## 2020-03-02 DIAGNOSIS — I350 Nonrheumatic aortic (valve) stenosis: Secondary | ICD-10-CM | POA: Diagnosis not present

## 2020-03-02 DIAGNOSIS — I255 Ischemic cardiomyopathy: Secondary | ICD-10-CM | POA: Diagnosis not present

## 2020-03-02 DIAGNOSIS — I10 Essential (primary) hypertension: Secondary | ICD-10-CM | POA: Diagnosis not present

## 2020-03-02 DIAGNOSIS — R000 Tachycardia, unspecified: Secondary | ICD-10-CM | POA: Diagnosis not present

## 2020-03-02 NOTE — Progress Notes (Addendum)
Cardiology Office Note:    Date:  03/02/2020   ID:  [name], DOB 1950/09/15, MRN [id number]  PCP:  [name], MD  Cardiologist:  Fransico Him, MD    Referring MD: [name], MD   No chief complaint on file.   History of Present Illness:    [name] is a 69 y.o. male with a hx of CAD, aortic stenosis and systolic HF. Hehad anon-STEMI in 2013 treated with PCI of the LCx/OM. LHC in 5/14 demonstrated severe 2 vessel CAD with critical in-stent restenosis in the LCx. He underwent CABG (SVG-PDA, SVG-LCx). He also has a history of aortic stenosis. Echo 10/2016 showed moderately reduced LVF at 35-40% with diffuse HK, mild AS and moderate AR. Right and Left heart cath showed mild to moderate AS with AVA 0.837cm2 and EF 45% with patent SVG to RPDA and SVG to OM1 With occluded native vessels. He ultimately was diagnosed with low gradient aortic stenosis with AI and underwent TAVR with a 29 mm EdwardsSapein3 THV via transfemoral approach after being admitted with acute exacerbation of CHF on 05/15/2018.  His baseline weightin the past had been around 162lbs butover the past few months has been running about 154lbs. He has not tolerated Entresto due to hypotension. He has been on low dose BB. He only take Lasix PRN for weight >165lbs and has only been taking recently about twice weekly. He recently saw Dr. Rayann Heman after repeat 2D echo showed persistently reduced LVF with EF 20-25%. Due to active lung Ca he was felt not to be a candidate for ICD but Dr. Rayann Heman felt he was a candidate for CRT-D but patient did not want to pursue at that time.  \He was also found to have SVT which was unclear whether sinus in origin or atrial tach.  He was already on Toprol which was increased to 75mg  daily.  2D echo and lexiscan myoview were ordered but cancelled on followup study as HR was still not under control and wanted to HR controlled better first.  A ddimer was elevated and CHest CTA  was neg for PE but showed LLL atelectasis vs. PNA with early interstitial edema.  He was started on Lasix 40mg  BID for 3 days.  He was seen back by Melina Copa, PA and EKG showed persistence of SVT.  He was started on Corlanor.  It was felt that possibly his CHF was exacerbated by his tachycardia. His weight was down 5lbs at that OV and his Lasix was decreased to 20mg  daily with 2gm Na diet and 2L fluid restriction.  He was seen by Dr. Rayann Heman in February regarding ICD and wanted to wait and see if EF improve on maximal med therapy. He ultimately underwent BiV-ICD implant on 02/11/2020 by Dr. Rayann Heman for persistent LV dysfunction with EF <20%.  He is here today for followup and is doing well.  He denies any chest pain or pressure, SOB, DOE, PND, orthopnea, LE edema, dizziness, palpitations or syncope. He is compliant with his meds and is tolerating meds with no SE.    Past Medical History:  Diagnosis Date  . AICD (automatic cardioverter/defibrillator) present 02/11/2020  . Anemia   . Arthritis   . Atrial tachycardia ([place])   . Chronic systolic CHF (congestive heart failure) ([place]) 07/04/2018  . CKD (chronic kidney disease), stage III   . Coronary artery disease 02/2012   a. s/p stenting in 2013  b. s/p CABGx2V (SVG--> PDA, SVG--> LCx).  . Diabetes mellitus  neuropathy  insulin dependent  . Dilated aortic root ([place])   . Dyslipidemia   . Erectile dysfunction   . GERD (gastroesophageal reflux disease)   . History of CVA (cerebrovascular accident)   . History of kidney stones   . Hx of radiation therapy to mediastinum 1985  . Hypertension   . 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 OSA (obstructive sleep apnea) 07/04/2018   Severe obstructive sleep apnea with an AHI of 30/h and mild central sleep apnea at 13.7/h with oxygen desaturations as low as 79%.  Intolerant to PAP therapy  . S/P TAVR (transcatheter aortic valve replacement) 05/15/2018   29 mm Edwards Sapien 3 transcatheter heart  valve placed via percutaneous right transfemoral approach   . Severe aortic stenosis     Past Surgical History:  Procedure Laterality Date  . BIV ICD INSERTION CRT-D N/A 02/11/2020   Procedure: BIV ICD INSERTION CRT-D;  Surgeon: Thompson Grayer, MD;  Location: [place] [facility];  Service: Cardiovascular;  Laterality: N/A;  . COLONOSCOPY  07/25/2012   Procedure: COLONOSCOPY;  Surgeon: Winfield Cunas., MD;  Location: [facility] ENDOSCOPY;  Service: Endoscopy;  Laterality: N/A;  . COLONOSCOPY N/A 12/02/2013   Procedure: COLONOSCOPY;  Surgeon: Winfield Cunas., MD;  Location: [facility] ENDOSCOPY;  Service: Endoscopy;  Laterality: N/A;  . CORONARY ARTERY BYPASS GRAFT N/A 01/04/2013   Procedure: CORONARY ARTERY BYPASS GRAFTING (CABG) times two using right saphenous vein harvested with endoscope.;  Surgeon: Ivin Poot, MD;  Location: [place];  Service: Open Heart Surgery;  Laterality: N/A;  . ESOPHAGOGASTRODUODENOSCOPY  07/20/2012   Procedure: ESOPHAGOGASTRODUODENOSCOPY (EGD);  Surgeon: Winfield Cunas., MD;  Location: [facility] ENDOSCOPY;  Service: Endoscopy;  Laterality: N/A;  . ESOPHAGOGASTRODUODENOSCOPY N/A 12/02/2013   Procedure: ESOPHAGOGASTRODUODENOSCOPY (EGD);  Surgeon: Winfield Cunas., MD;  Location: [facility];  Service: Endoscopy;  Laterality: N/A;  . HOT HEMOSTASIS N/A 12/02/2013   Procedure: HOT HEMOSTASIS (ARGON PLASMA COAGULATION/BICAP);  Surgeon: Winfield Cunas., MD;  Location: [facility];  Service: Endoscopy;  Laterality: N/A;  . INTRAOPERATIVE TRANSESOPHAGEAL ECHOCARDIOGRAM N/A 01/04/2013   Procedure: INTRAOPERATIVE TRANSESOPHAGEAL ECHOCARDIOGRAM;  Surgeon: Ivin Poot, MD;  Location: [place];  Service: Open Heart Surgery;  Laterality: N/A;  . INTRAOPERATIVE TRANSTHORACIC ECHOCARDIOGRAM N/A 05/15/2018   Procedure: INTRAOPERATIVE TRANSTHORACIC ECHOCARDIOGRAM;  Surgeon: Burnell Blanks, MD;  Location: [place];  Service: Open Heart Surgery;  Laterality: N/A;  . IR THORACENTESIS ASP  PLEURAL SPACE W/IMG GUIDE  10/19/2018  . LEFT HEART CATHETERIZATION WITH CORONARY ANGIOGRAM N/A 03/06/2012   Procedure: LEFT HEART CATHETERIZATION WITH CORONARY ANGIOGRAM;  Surgeon: [name], MD;  Location: [place] CATH LAB;  Service: Cardiovascular;  Laterality: N/A;  . LITHOTRIPSY    . [place]  . RADIAL ARTERY HARVEST Left 01/04/2013   Procedure: RADIAL ARTERY HARVEST;  Surgeon: Ivin Poot, MD;  Location: [place];  Service: Vascular;  Laterality: Left;  Artery not havested. Unsuitable for use.  . resection mediastinal seminonma    . RIGHT/LEFT HEART CATH AND CORONARY/GRAFT ANGIOGRAPHY N/A 11/15/2016   Procedure: Right/Left Heart Cath and Coronary/Graft Angiography;  Surgeon: Leonie Man, MD;  Location: [place] [facility];  Service: Cardiovascular;  Laterality: N/A;  . RIGHT/LEFT HEART CATH AND CORONARY/GRAFT ANGIOGRAPHY N/A 04/13/2018   Procedure: RIGHT/LEFT HEART CATH AND CORONARY/GRAFT ANGIOGRAPHY;  Surgeon: Jolaine Artist, MD;  Location: [place] [facility];  Service: Cardiovascular;  Laterality: N/A;  . TRANSCATHETER AORTIC VALVE REPLACEMENT, TRANSFEMORAL  05/15/2018  . TRANSCATHETER AORTIC VALVE REPLACEMENT, TRANSFEMORAL N/A 05/15/2018   Procedure: TRANSCATHETER AORTIC VALVE REPLACEMENT, TRANSFEMORAL;  Surgeon: Burnell Blanks, MD;  Location: [place];  Service: Open Heart Surgery;  Laterality: N/A;    Current Medications: Current Meds  Medication Sig  . amitriptyline (ELAVIL) 25 MG tablet Take 25 mg by mouth at bedtime.   Marland Kitchen amoxicillin (AMOXIL) 500 MG capsule Take 1 capsule (500 mg total) by mouth as directed. Take 4 tablets (2000 mg ) 1 hour prior to dental procedures. (Patient taking differently: Take 2,000 mg by mouth as directed. Take 4 tablets (2000 mg ) 1 hour prior to dental procedures.)  . aspirin 81 MG chewable tablet Chew 1 tablet (81 mg total) by mouth daily.  . B Complex-C (SUPER B COMPLEX PO) Take 1 tablet by mouth daily.  . B-D ULTRAFINE III SHORT  PEN 31G X 8 MM MISC 1 each by Other route daily as needed (GLUCOSE TESTING (INSULINE NEEDLE)).   . Cholecalciferol (VITAMIN D-3) 5000 units TABS Take 5,000 Units by mouth daily.  Steffanie Dunn 5 MG TABS tablet TAKE 1 TABLET(5 MG) BY MOUTH TWICE DAILY WITH A MEAL (Patient taking differently: Take 5 mg by mouth 2 (two) times daily with a meal. )  . furosemide (LASIX) 20 MG tablet Take 1 tablet (20 mg total) by mouth daily.  . insulin detemir (LEVEMIR) 100 UNIT/ML injection Inject 10-18 Units into the skin See admin instructions. 18 units in the morning, 10 units at bedtime  . insulin lispro (HUMALOG) 100 UNIT/ML KwikPen Inject 2-3 Units into the skin See admin instructions. Per sliding scale at [employer] and [employer]  . levothyroxine (SYNTHROID, LEVOTHROID) 50 MCG tablet Take 50 mcg by mouth daily before breakfast.   . losartan (COZAAR) 25 MG tablet Take 1 tablet (25 mg total) by mouth daily.  . metFORMIN (GLUCOPHAGE) 500 MG tablet Take 2 tablets (1,000 mg total) by mouth 2 (two) times daily with a meal.  . metoprolol succinate (TOPROL-XL) 25 MG 24 hr tablet Take 75 mg by mouth daily.  . Multiple Vitamin (MULTIVITAMIN) tablet Take 1 tablet by mouth daily.  . ONE TOUCH ULTRA TEST test strip 1 each by Other route as needed for other.   . simvastatin (ZOCOR) 20 MG tablet Take 20 mg by mouth every evening.   . zolpidem (AMBIEN) 5 MG tablet Take 2.5 mg by mouth at bedtime.      Allergies:   Lipitor [atorvastatin] and Adhesive [tape]   Social History   Socioeconomic History  . Marital status: Married    Spouse name: Not on file  . Number of children: Not on file  . Years of education: Not on file  . Highest education [name]: Not on file  Occupational History  . Not on file  Tobacco Use  . Smoking status: Former Smoker    Quit date: 03/06/1985    Years since quitting: 35.0  . Smokeless tobacco: Never Used  Vaping Use  . Vaping Use: Never used  Substance and Sexual Activity  . Alcohol use: No  .  Drug use: No  . Sexual activity: Not Currently  Other Topics Concern  . Not on file  Social History Narrative  . Not on file   Social Determinants of Health   Financial Resource Strain:   . Difficulty of Paying Living Expenses:   Food Insecurity:   . Worried About Charity fundraiser in the Last Year:   . [place] in the  Last Year:   Transportation Needs:   . Film/video editor (Medical):   Marland Kitchen Lack of Transportation (Non-Medical):   Physical Activity:   . Days of Exercise per Week:   . Minutes of Exercise per Session:   Stress:   . Feeling of Stress :   Social Connections:   . Frequency of Communication with Friends and Family:   . Frequency of Social Gatherings with Friends and Family:   . Attends Religious Services:   . Active Member of Clubs or Organizations:   . Attends Archivist Meetings:   Marland Kitchen Marital Status:      Family History: The patient's family history includes Cancer in his brother; Diabetes in his father, mother, and sister; Heart failure in his mother; Hypertension in his mother.  ROS:   Please see the history of present illness.    ROS  All other systems reviewed and negative.   EKGs/Labs/Other Studies Reviewed:    The following studies were reviewed today: none  EKG:  EKG is not ordered today.    Recent Labs: 06/13/2019: ALT 17 09/06/2019: Magnesium 2.1; NT-Pro BNP 2,117; TSH 5.660 02/07/2020: BUN 16; Creatinine, Ser 1.10; Hemoglobin 13.2; Platelets 189; Potassium 4.2; Sodium 139   Recent Lipid Panel    Component Value Date/Time   CHOL 127 06/13/2019 0846   TRIG 47 06/13/2019 0846   HDL 42 06/13/2019 0846   CHOLHDL 3.0 06/13/2019 0846   CHOLHDL 2.9 04/12/2018 0216   VLDL 7 04/12/2018 0216   LDLCALC 74 06/13/2019 0846    Physical Exam:    VS:  BP 116/66   Pulse 80   Ht 5\' 11"  (1.803 m)   Wt 155 lb 12.8 oz (70.7 kg)   SpO2 99%   BMI 21.73 kg/m     Wt Readings from Last 3 Encounters:  03/02/20 155 lb 12.8 oz  (70.7 kg)  02/12/20 154 lb 8.7 oz (70.1 kg)  01/14/20 154 lb (69.9 kg)      GEN: Well nourished, well developed in no acute distress HEENT: Normal NECK: No JVD; No carotid bruits LYMPHATICS: No lymphadenopathy CARDIAC:RRR, no murmurs, rubs, gallops RESPIRATORY:  Clear to auscultation without rales, wheezing or rhonchi  ABDOMEN: Soft, non-tender, non-distended MUSCULOSKELETAL:  No edema; No deformity  SKIN: Warm and dry NEUROLOGIC:  Alert and oriented x 3 PSYCHIATRIC:  Normal affect    ASSESSMENT:    1. Chronic systolic CHF (congestive heart failure) ([place])   2. Coronary artery disease involving native coronary artery of native heart without angina pectoris   3. Essential hypertension   4. Aortic stenosis, severe   5. Dyslipidemia   6. Tachycardia    PLAN:    In order of problems listed above:  1.Chronic systolic CHF -His baseline [id number]- 157lbs and is at that weight today -His echo in 2019 showed moderate to severely reduced LV function with EF 30 to 35% with hypokinesis of the anterior septum and inferior wall. Repeat echo 01/2019 showed EF 20-25% and 01/2020 < 20% -S/P BiV-AICD June2021  -he his NYYHA Class 2a and appears euvolemic on exam today -He did not tolerate Entresto due to hypotension but has not been on just Losartan -will continue on Lasix 20mg  daily, Corlanor 5mg  BID, Losartan 25mg  daily, Toprol XL 75mg  daily  2.ASCAD -s/p non-STEMI in 2013 treated with PCI of the LCx/OM. -LHC in 5/14 demonstrated severe 2 vessel CAD with critical in-stent restenosis in the LCx.  -He underwent CABG (SVG-PDA, SVG-LCx). -Repeat left heart cath showed  patent SVG to RPDA, SVG to OM1, 50% ostial D1, 40% proximal to mid LAD, occluded ostial RCA, occluded ostial left circumflex, 40% ostial LAD. -he has not had any anginal chest pain -Continue ASA, BB and statin  3. Hypertension  -BP well controlled -continue Toprol XL 75mg  daily and Losartan 25mg   daily  4.Severe low gradient low outputAS -S/P TAVR 10-1-19with29 mm EdwardsSapein3 THV via transfemoral approach. -2D echo10/21/2020showed a stable aortic valve TAVR withtrivialperivalvular AR. Mean aortic valve gradient37mmHg. -He will continue on ASA 81mg  daily.   5. Hyperlipidemia -his LDL goal is less than 70. -LDL was 74 in October -He will continue on Zocor 40 mg daily.  6. Tachycardia -maintaining NSR with HR 80bpm today on Corlanor 5mg  BID -continue Toprol XL to 75mg  daily and Corlanor -TSH was normal   Medication Adjustments/Labs and Tests Ordered: Current medicines are reviewed at length with the patient today.  Concerns regarding medicines are outlined above.  No orders of the defined types were placed in this encounter.  No orders of the defined types were placed in this encounter.   Signed, Fransico Him, MD  03/02/2020 9:30 AM    [place]

## 2020-03-02 NOTE — Patient Instructions (Signed)

## 2020-03-20 ENCOUNTER — Telehealth: Payer: Self-pay | Admitting: Cardiology

## 2020-03-20 NOTE — Telephone Encounter (Signed)
Need to get ok from EP post ICD placement

## 2020-03-20 NOTE — Telephone Encounter (Signed)
Patient needs a note stating that he is okay to return to PT for his shoulder.

## 2020-03-20 NOTE — Telephone Encounter (Signed)
Patient states he needs a note sent to [place], Dr. Candis Shine stating it's okay for him to go back to PT.

## 2020-03-26 ENCOUNTER — Encounter: Payer: Self-pay | Admitting: *Deleted

## 2020-03-26 DIAGNOSIS — M7501 Adhesive capsulitis of right shoulder: Secondary | ICD-10-CM | POA: Diagnosis not present

## 2020-03-27 DIAGNOSIS — C3490 Malignant neoplasm of unspecified part of unspecified bronchus or lung: Secondary | ICD-10-CM | POA: Diagnosis not present

## 2020-03-27 DIAGNOSIS — E7800 Pure hypercholesterolemia, unspecified: Secondary | ICD-10-CM | POA: Diagnosis not present

## 2020-03-27 DIAGNOSIS — E1165 Type 2 diabetes mellitus with hyperglycemia: Secondary | ICD-10-CM | POA: Diagnosis not present

## 2020-03-27 DIAGNOSIS — E11319 Type 2 diabetes mellitus with unspecified diabetic retinopathy without macular edema: Secondary | ICD-10-CM | POA: Diagnosis not present

## 2020-03-27 DIAGNOSIS — I10 Essential (primary) hypertension: Secondary | ICD-10-CM | POA: Diagnosis not present

## 2020-03-27 DIAGNOSIS — N189 Chronic kidney disease, unspecified: Secondary | ICD-10-CM | POA: Diagnosis not present

## 2020-03-27 DIAGNOSIS — N529 Male erectile dysfunction, unspecified: Secondary | ICD-10-CM | POA: Diagnosis not present

## 2020-03-27 DIAGNOSIS — E039 Hypothyroidism, unspecified: Secondary | ICD-10-CM | POA: Diagnosis not present

## 2020-03-30 DIAGNOSIS — M7501 Adhesive capsulitis of right shoulder: Secondary | ICD-10-CM | POA: Diagnosis not present

## 2020-04-02 DIAGNOSIS — M7501 Adhesive capsulitis of right shoulder: Secondary | ICD-10-CM | POA: Diagnosis not present

## 2020-04-07 DIAGNOSIS — M7501 Adhesive capsulitis of right shoulder: Secondary | ICD-10-CM | POA: Diagnosis not present

## 2020-04-10 DIAGNOSIS — M7501 Adhesive capsulitis of right shoulder: Secondary | ICD-10-CM | POA: Diagnosis not present

## 2020-04-13 DIAGNOSIS — M7501 Adhesive capsulitis of right shoulder: Secondary | ICD-10-CM | POA: Diagnosis not present

## 2020-04-15 DIAGNOSIS — M7501 Adhesive capsulitis of right shoulder: Secondary | ICD-10-CM | POA: Diagnosis not present

## 2020-04-27 ENCOUNTER — Other Ambulatory Visit: Payer: Self-pay | Admitting: Cardiology

## 2020-04-29 DIAGNOSIS — M7501 Adhesive capsulitis of right shoulder: Secondary | ICD-10-CM | POA: Diagnosis not present

## 2020-04-30 ENCOUNTER — Ambulatory Visit (HOSPITAL_COMMUNITY)
Admission: RE | Admit: 2020-04-30 | Discharge: 2020-04-30 | Disposition: A | Discharge status: Home / Self Care | Payer: Medicare Other | Source: Ambulatory Visit | Attending: Radiation Oncology | Admitting: Radiation Oncology

## 2020-04-30 ENCOUNTER — Other Ambulatory Visit: Payer: Self-pay

## 2020-04-30 ENCOUNTER — Ambulatory Visit: Payer: Self-pay | Admitting: Radiation Oncology

## 2020-04-30 DIAGNOSIS — C3432 Malignant neoplasm of lower lobe, left bronchus or lung: Secondary | ICD-10-CM | POA: Diagnosis not present

## 2020-04-30 DIAGNOSIS — J90 Pleural effusion, not elsewhere classified: Secondary | ICD-10-CM | POA: Diagnosis not present

## 2020-04-30 DIAGNOSIS — C3490 Malignant neoplasm of unspecified part of unspecified bronchus or lung: Secondary | ICD-10-CM | POA: Diagnosis not present

## 2020-04-30 DIAGNOSIS — J8410 Pulmonary fibrosis, unspecified: Secondary | ICD-10-CM | POA: Diagnosis not present

## 2020-04-30 DIAGNOSIS — I700 Atherosclerosis of aorta: Secondary | ICD-10-CM | POA: Diagnosis not present

## 2020-04-30 NOTE — Progress Notes (Addendum)
Radiation Oncology         [phone] ________________________________  Name: [name] MRN: [id number]  Date: 05/04/2020  DOB: Apr 07, 1951  Follow-Up Visit Note  CC: [name], MD  [name], MD    ICD-10-CM   1. Squamous cell carcinoma of bronchus in left lower lobe (HCC)  C34.32 CT Chest Wo Contrast  2. Nodule of lower lobe of left lung  R91.1     Diagnosis: Stage IB(T2a, N0, M0) squamous cell carcinoma of the left lower lung  Interval Since Last Radiation: One year, four months, and three weeks.  12/03/2018 - 12/13/2018: LLL / 50 Gy in 5 fractions (SBRT)  1986: RT given as part of the management of his malignant seminoma presenting in the upper mediastinum. Details of this treatment are not available. The patient reports having several weeks of radiation therapy. He reports his treatments were given at [facility].  Narrative:  The patient returns today for routine follow-up. Since his last visit, he underwent a BiV ICD implantation on 02/11/2020 under the care of Dr. Rayann Heman.  Most recent chest CT scan performed on 04/30/2020 showed a stable 6 mm right upper lobe lung nodule. There were no specific findings identified to suggest local tumor recurrence or metastatic disease. The previously seen left pleural effusion had resolved.  On review of systems, he reports feeling well. He denies chest pain breathing difficulties or hemoptysis.  He denies any significant cough.  ALLERGIES:  is allergic to lipitor [atorvastatin] and adhesive [tape].  Meds: Current Outpatient Medications  Medication Sig Dispense Refill  . amitriptyline (ELAVIL) 25 MG tablet Take 25 mg by mouth at bedtime.     Marland Kitchen amoxicillin (AMOXIL) 500 MG capsule Take 1 capsule (500 mg total) by mouth as directed. Take 4 tablets (2000 mg ) 1 hour prior to dental procedures. (Patient taking differently: Take 2,000 mg by mouth as directed. Take 4 tablets (2000 mg ) 1 hour prior to dental procedures.) 12 capsule 12   . aspirin 81 MG chewable tablet Chew 1 tablet (81 mg total) by mouth daily.    . B Complex-C (SUPER B COMPLEX PO) Take 1 tablet by mouth daily.    . B-D ULTRAFINE III SHORT PEN 31G X 8 MM MISC 1 each by Other route daily as needed (GLUCOSE TESTING (INSULINE NEEDLE)).     . Cholecalciferol (VITAMIN D-3) 5000 units TABS Take 5,000 Units by mouth daily.    . CORLANOR 5 MG TABS tablet TAKE 1 TABLET(5 MG) BY MOUTH TWICE DAILY WITH A MEAL (Patient taking differently: Take 5 mg by mouth 2 (two) times daily with a meal. ) 60 tablet 2  . furosemide (LASIX) 20 MG tablet Take 1 tablet (20 mg total) by mouth daily. 90 tablet 3  . insulin detemir (LEVEMIR) 100 UNIT/ML injection Inject 10-18 Units into the skin See admin instructions. 18 units in the morning, 10 units at bedtime    . insulin lispro (HUMALOG) 100 UNIT/ML KwikPen Inject 2-3 Units into the skin See admin instructions. Per sliding scale at [employer] and [employer]    . levothyroxine (SYNTHROID, LEVOTHROID) 50 MCG tablet Take 50 mcg by mouth daily before breakfast.     . losartan (COZAAR) 25 MG tablet TAKE 1 TABLET(25 MG) BY MOUTH DAILY 90 tablet 0  . metFORMIN (GLUCOPHAGE) 500 MG tablet Take 2 tablets (1,000 mg total) by mouth 2 (two) times daily with a meal.    . metoprolol succinate (TOPROL-XL) 25 MG 24 hr tablet Take 75  mg by mouth daily.    . Multiple Vitamin (MULTIVITAMIN) tablet Take 1 tablet by mouth daily.    . ONE TOUCH ULTRA TEST test strip 1 each by Other route as needed for other.     . simvastatin (ZOCOR) 20 MG tablet Take 20 mg by mouth every evening.     . zolpidem (AMBIEN) 5 MG tablet Take 2.5 mg by mouth at bedtime.      No current facility-administered medications for this encounter.    Physical Findings: The patient is in no acute distress. Patient is alert and oriented.  height is 5\' 11"  (1.803 m) and weight is 157 lb 8 oz (71.4 kg). His oral temperature is 97.6 F (36.4 C). His blood pressure is 118/73 and his pulse is 79.  His respiration is 18 and oxygen saturation is 100%.   Lungs are clear to auscultation bilaterally. Heart has regular rate and rhythm. No palpable cervical, supraclavicular, or axillary adenopathy. Abdomen soft, non-tender, normal bowel sounds.  Defibrillator in place in the left upper chest region  Lab Findings: Lab Results  Component Value Date   WBC 4.1 02/07/2020   HGB 13.2 02/07/2020   HCT 39.4 02/07/2020   MCV 77 (L) 02/07/2020   PLT 189 02/07/2020    Radiographic Findings: CT Chest Wo Contrast  Result Date: 04/30/2020 CLINICAL DATA:  Non-small cell lung cancer.  Restaging. EXAM: CT CHEST WITHOUT CONTRAST TECHNIQUE: Multidetector CT imaging of the chest was performed following the standard protocol without IV contrast. COMPARISON:  10/24/2019 FINDINGS: Cardiovascular: Normal heart size. Left chest wall ICD is identified with leads in the right atrial appendage and right ventricle. Previous median sternotomy, CABG and aortic valve repair. Mediastinum/Nodes: Normal appearance of the thyroid gland. The trachea appears patent and is midline. Normal appearance of the esophagus. No enlarged axillary, supraclavicular, or mediastinal lymph nodes. Lungs/Pleura: Resolution of previous left pleural effusion. Geographic, bandlike area of masslike architectural distortion and fibrosis is identified involving the superior segment of the left lower lobe. Findings are compatible with changes due to external beam radiation. Although the treated tumor is indistinguishable from changes due to external beam radiation, there are no specific findings identified to suggest local tumor recurrence. 6 mm nodule in the basilar right upper lobe is stable, image 71/5. Upper Abdomen: No acute abnormality. Musculoskeletal: Arthro pathic changes are noted involving the sternoclavicular joints, left greater than right. No acute or suspicious bone lesions. IMPRESSION: 1. Stable CT of the chest. No specific findings identified  to suggest local tumor recurrence or metastatic disease. 2. Stable 6 mm right upper lobe lung nodule. 3. Resolution of previous left pleural effusion. 4. Aortic atherosclerosis. Aortic Atherosclerosis (ICD10-I70.0). Electronically Signed   By: Kerby Moors M.D.   On: 04/30/2020 13:04    Impression:  Stage IB(T2a, N0, M0) squamous cell carcinoma of the left lower lung.   No evidence of recurrence on clinical exam today. Recent chest CT scan was stable. There were no specific findings identified to suggest local tumor recurrence or metastatic disease.   Plan: Routine follow-up with radiation oncology in six months. Chest CT scan will be performed prior to that visit.  Total time spent in this encounter was 25 minutes which included reviewing the patient's most recent ICD placement, chest CT scan,  physical examination, documentation, and ordering of future chest CT scan.  ____________________________________  Blair Promise, PhD, MD  This document serves as a record of services personally performed by Gery Pray, MD. It was created  on his behalf by Clerance Lav, a trained medical scribe. The creation of this record is based on the scribe's personal observations and the provider's statements to them. This document has been checked and approved by the attending provider.

## 2020-05-04 ENCOUNTER — Encounter: Payer: Self-pay | Admitting: Radiation Oncology

## 2020-05-04 ENCOUNTER — Ambulatory Visit
Admission: RE | Admit: 2020-05-04 | Discharge: 2020-05-04 | Disposition: A | Discharge status: Home / Self Care | Payer: Medicare Other | Source: Ambulatory Visit | Attending: Radiation Oncology | Admitting: Radiation Oncology

## 2020-05-04 ENCOUNTER — Other Ambulatory Visit: Payer: Self-pay

## 2020-05-04 ENCOUNTER — Inpatient Hospital Stay: Admission: RE | Admit: 2020-05-04 | Payer: Medicare Other | Source: Ambulatory Visit | Admitting: Radiation Oncology

## 2020-05-04 VITALS — BP 118/73 | HR 79 | Temp 97.6°F | Resp 18 | Ht 71.0 in | Wt 157.5 lb

## 2020-05-04 DIAGNOSIS — Z7982 Long term (current) use of aspirin: Secondary | ICD-10-CM | POA: Diagnosis not present

## 2020-05-04 DIAGNOSIS — Z08 Encounter for follow-up examination after completed treatment for malignant neoplasm: Secondary | ICD-10-CM | POA: Diagnosis not present

## 2020-05-04 DIAGNOSIS — Z923 Personal history of irradiation: Secondary | ICD-10-CM | POA: Diagnosis not present

## 2020-05-04 DIAGNOSIS — I700 Atherosclerosis of aorta: Secondary | ICD-10-CM | POA: Insufficient documentation

## 2020-05-04 DIAGNOSIS — Z79899 Other long term (current) drug therapy: Secondary | ICD-10-CM | POA: Insufficient documentation

## 2020-05-04 DIAGNOSIS — R911 Solitary pulmonary nodule: Secondary | ICD-10-CM

## 2020-05-04 DIAGNOSIS — Z85118 Personal history of other malignant neoplasm of bronchus and lung: Secondary | ICD-10-CM | POA: Diagnosis not present

## 2020-05-04 DIAGNOSIS — C3432 Malignant neoplasm of lower lobe, left bronchus or lung: Secondary | ICD-10-CM

## 2020-05-04 NOTE — Progress Notes (Signed)
Patient here for a f/u visit and to review recent CT results with Dr. Sondra Come.  Diagnosis: Stage IB(T2a, N0, M0) squamous cell carcinoma of the left lower lung  Interval Since Last Radiation: One year, four months, and three weeks.  12/03/2018 - 12/13/2018: LLL / 50Gy in68fractions (SBRT)  Patient denies pain or shortness of breath. He reports an occasional cough. Reports appetite is good and no problems swallowing.  BP 118/73 (BP Location: Left Arm, Patient Position: Sitting)   Pulse 79   Temp 97.6 F (36.4 C) (Oral)   Resp 18   Ht 5\' 11"  (1.803 m)   Wt 157 lb 8 oz (71.4 kg)   SpO2 100%   BMI 21.97 kg/m    Wt Readings from Last 3 Encounters:  05/04/20 157 lb 8 oz (71.4 kg)  03/02/20 155 lb 12.8 oz (70.7 kg)  02/12/20 154 lb 8.7 oz (70.1 kg)

## 2020-05-13 ENCOUNTER — Ambulatory Visit (INDEPENDENT_AMBULATORY_CARE_PROVIDER_SITE_OTHER): Payer: Medicare Other | Admitting: Emergency Medicine

## 2020-05-13 DIAGNOSIS — I255 Ischemic cardiomyopathy: Secondary | ICD-10-CM

## 2020-05-14 LAB — CUP PACEART REMOTE DEVICE CHECK
Battery Remaining Longevity: 91 mo
Battery Remaining Percentage: 95 %
Battery Voltage: 3.19 V
Brady Statistic AP VP Percent: 0 %
Brady Statistic AP VS Percent: 1 %
Brady Statistic AS VP Percent: 1 %
Brady Statistic AS VS Percent: 99 %
Brady Statistic RA Percent Paced: 1 %
Brady Statistic RV Percent Paced: 1 %
Date Time Interrogation Session: 20210929020026
HighPow Impedance: 57 Ohm
HighPow Impedance: 57 Ohm
Implantable Lead Implant Date: 20210629
Implantable Lead Implant Date: 20210629
Implantable Lead Location: 753859
Implantable Lead Location: 753860
Implantable Pulse Generator Implant Date: 20210629
Lead Channel Impedance Value: 510 Ohm
Lead Channel Impedance Value: 590 Ohm
Lead Channel Pacing Threshold Amplitude: 0.5 V
Lead Channel Pacing Threshold Amplitude: 0.75 V
Lead Channel Pacing Threshold Pulse Width: 0.5 ms
Lead Channel Pacing Threshold Pulse Width: 0.5 ms
Lead Channel Sensing Intrinsic Amplitude: 1.5 mV
Lead Channel Sensing Intrinsic Amplitude: 12 mV
Lead Channel Setting Pacing Amplitude: 3.5 V
Lead Channel Setting Pacing Amplitude: 3.5 V
Lead Channel Setting Pacing Pulse Width: 0.5 ms
Lead Channel Setting Sensing Sensitivity: 0.5 mV
Pulse Gen Serial Number: 9932570

## 2020-05-15 NOTE — Progress Notes (Signed)
Remote ICD transmission.   

## 2020-05-18 ENCOUNTER — Ambulatory Visit (INDEPENDENT_AMBULATORY_CARE_PROVIDER_SITE_OTHER): Payer: Medicare Other | Admitting: Internal Medicine

## 2020-05-18 ENCOUNTER — Other Ambulatory Visit: Payer: Self-pay

## 2020-05-18 ENCOUNTER — Encounter: Payer: Self-pay | Admitting: Internal Medicine

## 2020-05-18 VITALS — BP 110/70 | HR 89 | Ht 71.0 in | Wt 160.6 lb

## 2020-05-18 DIAGNOSIS — I10 Essential (primary) hypertension: Secondary | ICD-10-CM

## 2020-05-18 DIAGNOSIS — I2510 Atherosclerotic heart disease of native coronary artery without angina pectoris: Secondary | ICD-10-CM

## 2020-05-18 DIAGNOSIS — I5022 Chronic systolic (congestive) heart failure: Secondary | ICD-10-CM | POA: Diagnosis not present

## 2020-05-18 DIAGNOSIS — I255 Ischemic cardiomyopathy: Secondary | ICD-10-CM

## 2020-05-18 NOTE — Patient Instructions (Addendum)
Medication Instructions:  Your physician recommends that you continue on your current medications as directed. Please refer to the Current Medication list given to you today.  *If you need a refill on your cardiac medications before your next appointment, please call your pharmacy*  Lab Work: None ordered.  If you have labs (blood work) drawn today and your tests are completely normal, you will receive your results only by: Marland Kitchen MyChart Message (if you have MyChart) OR . A paper copy in the mail If you have any lab test that is abnormal or we need to change your treatment, we will call you to review the results.  Testing/Procedures: None ordered.  Follow-Up: At [facility], you and your health needs are our priority.  As part of our continuing mission to provide you with exceptional heart care, we have created designated Provider Care Teams.  These Care Teams include your primary Cardiologist (physician) and Advanced Practice Providers (APPs -  Physician Assistants and Nurse Practitioners) who all work together to provide you with the care you need, when you need it.  We recommend signing up for the patient portal called "MyChart".  Sign up information is provided on this After Visit Summary.  MyChart is used to connect with patients for Virtual Visits (Telemedicine).  Patients are able to view lab/test results, encounter notes, upcoming appointments, etc.  Non-urgent messages can be sent to your provider as well.   To learn more about what you can do with MyChart, go to [email or web address].    Your next appointment:   Your physician wants you to follow-up in: 6 months with Oda Kilts and 1 year with Dr. Rayann Heman. You will receive a reminder letter in the mail two months in advance. If you don't receive a letter, please call our office to schedule the follow-up appointment.  Remote monitoring is used to monitor your ICD from home. This monitoring reduces the number of office visits  required to check your device to one time per year. It allows [place] to keep an eye on the functioning of your device to ensure it is working properly. You are scheduled for a device check from home on 08/12/2020. You may send your transmission at any time that day. If you have a wireless device, the transmission will be sent automatically. After your physician reviews your transmission, you will receive a postcard with your next transmission date.  Other Instructions:     The [facility] device clinic team is grateful for the opportunity to partner with you in your implanted pacemaker/ defibrillator needs.  Our goals are to provide the most up-to-date and comprehensive care for you and your implanted device.    Sharman Cheek is a specially trained registered nurse who will follow your device heart failure diagnostics.  She will call you weekly following discharge and review your medications as well as your daily weights and breathing status.  During the call, she will review with you the results of your recent device interrogation.    We believe that this program will allow [place] to better care for you.  Multiple studies have shown that remote monitoring can help to decrease hospitalizations as well as improve quality of life and decrease your risks of dying.  Through this program, we hope to partner with you to optimize your cardiac care.   Once again, we would like to thank you for allowing [place] to participate in your care.  If you have any questions, please call the office at  [phone].    Sincerely,   [facility] Team and Advanced Heart Failure Team

## 2020-05-18 NOTE — Progress Notes (Signed)
PCP: [name], MD Primary Cardiologist: Dr Radford Pax Primary EP: Dr Hulda Marin is a 69 y.o. male who presents today for routine electrophysiology followup.  Since his ICD implant, the patient reports doing very well.  Denies procedure related complications. Today, he denies symptoms of palpitations, chest pain, shortness of breath,  lower extremity edema, dizziness, presyncope, syncope, or ICD shocks.  The patient is otherwise without complaint today.   Past Medical History:  Diagnosis Date  . AICD (automatic cardioverter/defibrillator) present 02/11/2020  . Anemia   . Arthritis   . Atrial tachycardia ([place])   . Chronic systolic CHF (congestive heart failure) ([place]) 07/04/2018  . CKD (chronic kidney disease), stage III ([place])   . Coronary artery disease 02/2012   a. s/p stenting in 2013  b. s/p CABGx2V (SVG--> PDA, SVG--> LCx).  . Diabetes mellitus    neuropathy  insulin dependent  . Dilated aortic root ([place])   . Dyslipidemia   . Erectile dysfunction   . GERD (gastroesophageal reflux disease)   . History of CVA (cerebrovascular accident)   . History of kidney stones   . Hx of radiation therapy to mediastinum 1985  . Hypertension   . 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 OSA (obstructive sleep apnea) 07/04/2018   Severe obstructive sleep apnea with an AHI of 30/h and mild central sleep apnea at 13.7/h with oxygen desaturations as low as 79%.  Intolerant to PAP therapy  . S/P TAVR (transcatheter aortic valve replacement) 05/15/2018   29 mm Edwards Sapien 3 transcatheter heart valve placed via percutaneous right transfemoral approach   . Severe aortic stenosis    Past Surgical History:  Procedure Laterality Date  . BIV ICD INSERTION CRT-D N/A 02/11/2020   Procedure: BIV ICD INSERTION CRT-D;  Surgeon: Thompson Grayer, MD;  Location: [facility] CV LAB;  Service: Cardiovascular;  Laterality: N/A;  . COLONOSCOPY  07/25/2012   Procedure: COLONOSCOPY;  Surgeon:  Winfield Cunas., MD;  Location: [facility] ENDOSCOPY;  Service: Endoscopy;  Laterality: N/A;  . COLONOSCOPY N/A 12/02/2013   Procedure: COLONOSCOPY;  Surgeon: Winfield Cunas., MD;  Location: [facility] ENDOSCOPY;  Service: Endoscopy;  Laterality: N/A;  . CORONARY ARTERY BYPASS GRAFT N/A 01/04/2013   Procedure: CORONARY ARTERY BYPASS GRAFTING (CABG) times two using right saphenous vein harvested with endoscope.;  Surgeon: Ivin Poot, MD;  Location: [facility] [place];  Service: Open Heart Surgery;  Laterality: N/A;  . ESOPHAGOGASTRODUODENOSCOPY  07/20/2012   Procedure: ESOPHAGOGASTRODUODENOSCOPY (EGD);  Surgeon: Winfield Cunas., MD;  Location: [facility] ENDOSCOPY;  Service: Endoscopy;  Laterality: N/A;  . ESOPHAGOGASTRODUODENOSCOPY N/A 12/02/2013   Procedure: ESOPHAGOGASTRODUODENOSCOPY (EGD);  Surgeon: Winfield Cunas., MD;  Location: [facility]OS[facility];  Service: Endoscopy;  Laterality: N/A;  . HOT HEMOSTASIS N/A 12/02/2013   Procedure: HOT HEMOSTASIS (ARGON PLASMA COAGULATION/BICAP);  Surgeon: Winfield Cunas., MD;  Location: [facility] ENDOSCOPY;  Service: Endoscopy;  Laterality: N/A;  . INTRAOPERATIVE TRANSESOPHAGEAL ECHOCARDIOGRAM N/A 01/04/2013   Procedure: INTRAOPERATIVE TRANSESOPHAGEAL ECHOCARDIOGRAM;  Surgeon: Ivin Poot, MD;  Location: [facility];  Service: Open Heart Surgery;  Laterality: N/A;  . INTRAOPERATIVE TRANSTHORACIC ECHOCARDIOGRAM N/A 05/15/2018   Procedure: INTRAOPERATIVE TRANSTHORACIC ECHOCARDIOGRAM;  Surgeon: Burnell Blanks, MD;  Location: [facility];  Service: Open Heart Surgery;  Laterality: N/A;  . IR THORACENTESIS ASP PLEURAL SPACE W/IMG GUIDE  10/19/2018  . LEFT HEART CATHETERIZATION WITH CORONARY ANGIOGRAM N/A 03/06/2012   Procedure: LEFT HEART CATHETERIZATION WITH CORONARY ANGIOGRAM;  Surgeon: Tressia Miners  Remonia Richter, MD;  Location: [facility] CATH LAB;  Service: Cardiovascular;  Laterality: N/A;  . LITHOTRIPSY    . [facility]  . RADIAL ARTERY HARVEST Left 01/04/2013   Procedure: RADIAL ARTERY HARVEST;   Surgeon: Ivin Poot, MD;  Location: [facility];  Service: Vascular;  Laterality: Left;  Artery not havested. Unsuitable for use.  . resection mediastinal seminonma    . RIGHT/LEFT HEART CATH AND CORONARY/GRAFT ANGIOGRAPHY N/A 11/15/2016   Procedure: Right/Left Heart Cath and Coronary/Graft Angiography;  Surgeon: Leonie Man, MD;  Location: [facility];  Service: Cardiovascular;  Laterality: N/A;  . RIGHT/LEFT HEART CATH AND CORONARY/GRAFT ANGIOGRAPHY N/A 04/13/2018   Procedure: RIGHT/LEFT HEART CATH AND CORONARY/GRAFT ANGIOGRAPHY;  Surgeon: Jolaine Artist, MD;  Location: [facility];  Service: Cardiovascular;  Laterality: N/A;  . TRANSCATHETER AORTIC VALVE REPLACEMENT, TRANSFEMORAL  05/15/2018  . TRANSCATHETER AORTIC VALVE REPLACEMENT, TRANSFEMORAL N/A 05/15/2018   Procedure: TRANSCATHETER AORTIC VALVE REPLACEMENT, TRANSFEMORAL;  Surgeon: Burnell Blanks, MD;  Location: [facility];  Service: Open Heart Surgery;  Laterality: N/A;    ROS- all systems are reviewed and negative except as per HPI above  Current Outpatient Medications  Medication Sig Dispense Refill  . amitriptyline (ELAVIL) 25 MG tablet Take 25 mg by mouth at bedtime.     Marland Kitchen amoxicillin (AMOXIL) 500 MG capsule Take 1 capsule (500 mg total) by mouth as directed. Take 4 tablets (2000 mg ) 1 hour prior to dental procedures. 12 capsule 12  . aspirin 81 MG chewable tablet Chew 1 tablet (81 mg total) by mouth daily.    . B Complex-C (SUPER B COMPLEX PO) Take 1 tablet by mouth daily.    . B-D ULTRAFINE III SHORT PEN 31G X 8 MM MISC 1 each by Other route daily as needed (GLUCOSE TESTING (INSULINE NEEDLE)).     . Cholecalciferol (VITAMIN D-3) 5000 units TABS Take 5,000 Units by mouth daily.    . CORLANOR 5 MG TABS tablet TAKE 1 TABLET(5 MG) BY MOUTH TWICE DAILY WITH A MEAL 60 tablet 2  . furosemide (LASIX) 20 MG tablet Take 1 tablet (20 mg total) by mouth daily. 90 tablet 3  . insulin detemir (LEVEMIR) 100 UNIT/ML  injection Inject 10-18 Units into the skin See admin instructions. 18 units in the morning, 10 units at bedtime    . insulin lispro (HUMALOG) 100 UNIT/ML KwikPen Inject 2-3 Units into the skin See admin instructions. Per sliding scale at [employer] and [employer]    . levothyroxine (SYNTHROID, LEVOTHROID) 50 MCG tablet Take 50 mcg by mouth daily before breakfast.     . losartan (COZAAR) 25 MG tablet TAKE 1 TABLET(25 MG) BY MOUTH DAILY 90 tablet 0  . metFORMIN (GLUCOPHAGE) 500 MG tablet Take 2 tablets (1,000 mg total) by mouth 2 (two) times daily with a meal.    . metoprolol succinate (TOPROL-XL) 25 MG 24 hr tablet Take 75 mg by mouth daily.    . Multiple Vitamin (MULTIVITAMIN) tablet Take 1 tablet by mouth daily.    . ONE TOUCH ULTRA TEST test strip 1 each by Other route as needed for other.     . simvastatin (ZOCOR) 20 MG tablet Take 20 mg by mouth every evening.     . zolpidem (AMBIEN) 5 MG tablet Take 2.5 mg by mouth at bedtime.      No current facility-administered medications for this visit.    Physical Exam: Vitals:   05/18/20 1353  BP: 110/70  Pulse: 89  SpO2: 98%  Weight: 160 lb 9.6 oz (72.8 kg)  Height: 5\' 11"  (1.803 m)    GEN- The patient is well appearing, alert and oriented x 3 today.   Head- normocephalic, atraumatic Eyes-  Sclera clear, conjunctiva pink Ears- hearing intact Oropharynx- clear Lungs- Clear to ausculation bilaterally, normal work of breathing Chest- ICD pocket is well healed Heart- Regular rate and rhythm, no murmurs, rubs or gallops, PMI not laterally displaced GI- soft, NT, ND, + BS Extremities- no clubbing, cyanosis, or edema  ICD interrogation- reviewed in detail today,  See PACEART report  ekg tracing ordered today is personally reviewed and shows sinus rhythm with PR 222 msec, QRS 150 msec (LBBB)  Wt Readings from Last 3 Encounters:  05/18/20 160 lb 9.6 oz (72.8 kg)  05/04/20 157 lb 8 oz (71.4 kg)  03/02/20 155 lb 12.8 oz (70.7 kg)     Assessment and Plan:  1.  Chronic systolic dysfunction/ CAD/ ischemic CM euvolemic today with NYHA Class IIa symptoms No ischemic symptoms Stable on an appropriate medical regimen Normal ICD function See Pace Art report No changes today he is not device dependant today Enroll to be followed in ICM device clinic He did not have suitable targets for CRT.  We could consider left bundle pacing for LB correction if his condition worsens.  2. HTN Stable No change required today  3. S/p TAVR Stable No change required today   Risks, benefits and potential toxicities for medications prescribed and/or refilled reviewed with patient today.   Enroll in [facility] device clinic Return to see EP PA in 6 months I will see in a year  Thompson Grayer MD, [facility] 05/18/2020 2:01 PM

## 2020-05-19 ENCOUNTER — Other Ambulatory Visit: Payer: Self-pay | Admitting: Cardiology

## 2020-05-21 ENCOUNTER — Telehealth: Payer: Self-pay | Admitting: Cardiology

## 2020-05-21 ENCOUNTER — Other Ambulatory Visit: Payer: Self-pay

## 2020-05-21 MED ORDER — IVABRADINE HCL 5 MG PO TABS: 5.0000 mg | ORAL_TABLET | Freq: Two times a day (BID) | ORAL | 9 refills | Status: DC

## 2020-05-21 NOTE — Telephone Encounter (Signed)
   *  STAT* If patient is at the pharmacy, call can be transferred to refill team.   1. Which medications need to be refilled? (please list name of each medication and dose if known) CORLANOR 5 MG TABS tablet  2. Which pharmacy/location (including street and city if local pharmacy) is medication to be sent to? [facility]  [address], [place], [place] [address]  3. Do they need a 30 day or 90 day supply? 60 days   Pt would like to call this prescription to a new pharmacy location

## 2020-05-25 ENCOUNTER — Other Ambulatory Visit: Payer: Self-pay | Admitting: Physician Assistant

## 2020-05-25 DIAGNOSIS — Z23 Encounter for immunization: Secondary | ICD-10-CM | POA: Diagnosis not present

## 2020-05-26 NOTE — Telephone Encounter (Signed)
Pt's pharmacy is requesting a refill on amoxicillin. Would Dr. Radford Pax like to refill this medication? Please address

## 2020-05-26 NOTE — Telephone Encounter (Signed)
Pt has had TAVR which requires antibiotics prior to dental work.  Ok to fill.

## 2020-06-05 ENCOUNTER — Telehealth: Payer: Self-pay

## 2020-06-05 IMAGING — CT CT ANGIO CHEST
2 of 7 series · 14 of 36 positions shown · IV contrast (APPLIED)
Comparison: Chest CT 12/31/2012. CT the abdomen and pelvis
07/04/2010.

CLINICAL DATA: 66-year-old male with history of severe aortic
stenosis. Preprocedural study prior to potential transcatheter
aortic valve replacement (TAVR) procedure.

EXAM:
CT ANGIOGRAPHY CHEST, ABDOMEN AND PELVIS
TECHNIQUE: Multidetector CT imaging through the chest, abdomen and pelvis was
performed using the standard protocol during bolus administration of
intravenous contrast. Multiplanar reconstructed images and MIPs were
obtained and reviewed to evaluate the vascular anatomy.
CONTRAST:  100mL 1ERMUI-SHL IOPAMIDOL (1ERMUI-SHL) INJECTION 76%

[Series 5: ax thins · axial · 0.59mm/px · z∈[+719,+1298]mm · 13 of 653 slices shown]
[im 37/653  lung]
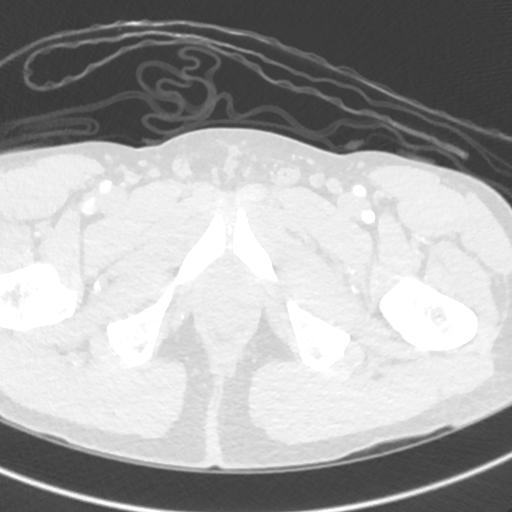
[im 73/653  mediastinal]
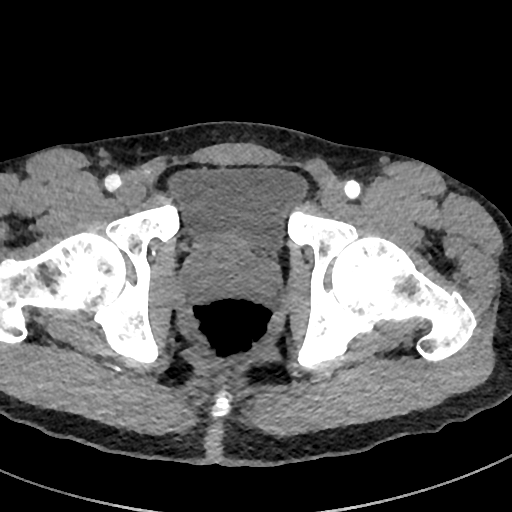
[im 145/653  lung]
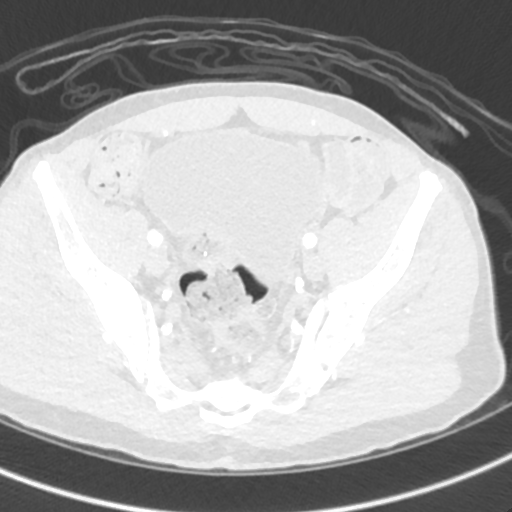
[im 182/653  mediastinal]
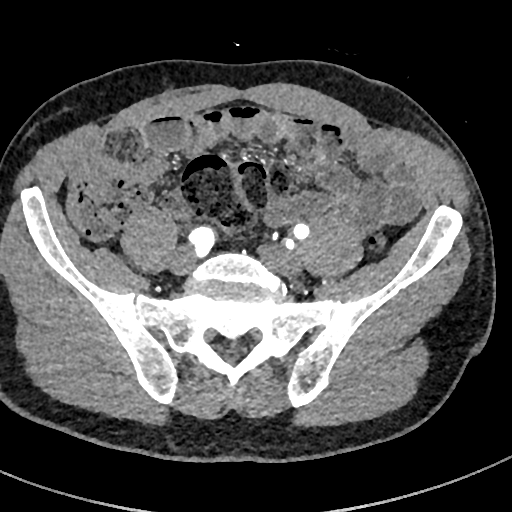
[im 218/653  lung]
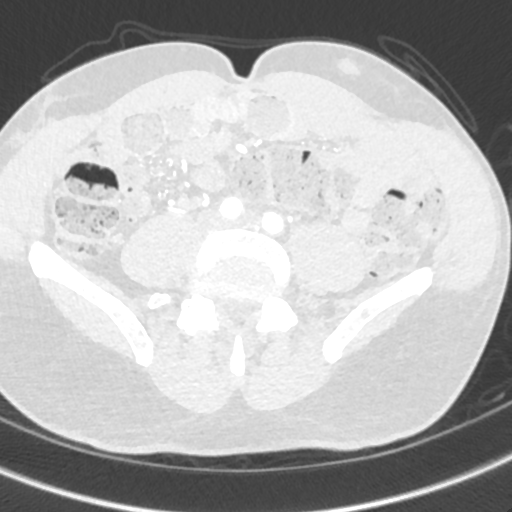
[im 290/653  mediastinal]
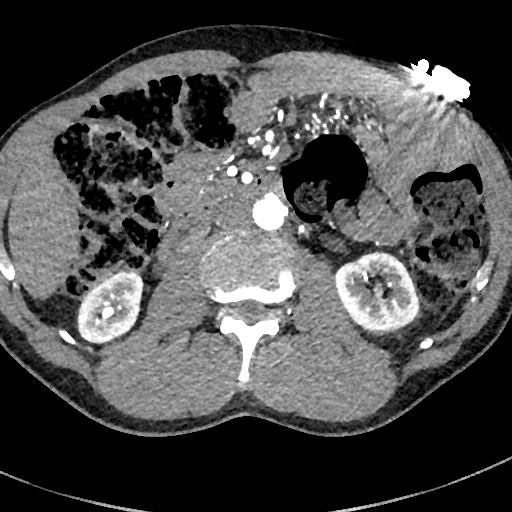
[im 327/653  lung]
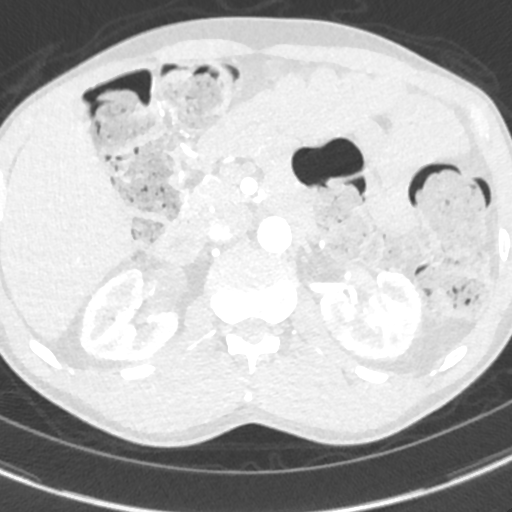
[im 363/653  mediastinal]
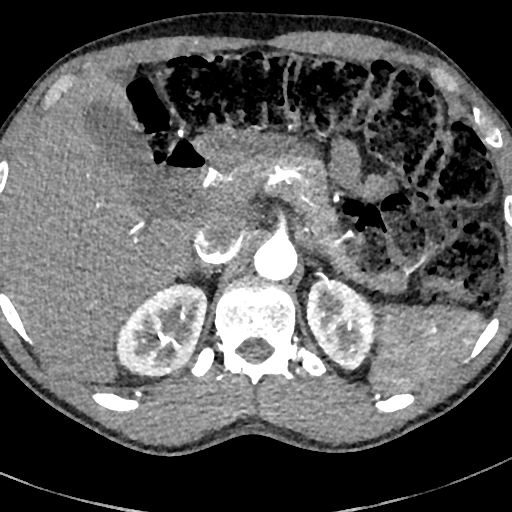
[im 435/653  lung]
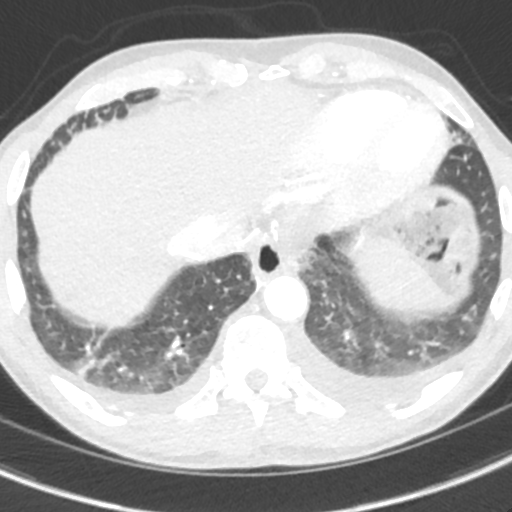
[im 471/653  mediastinal]
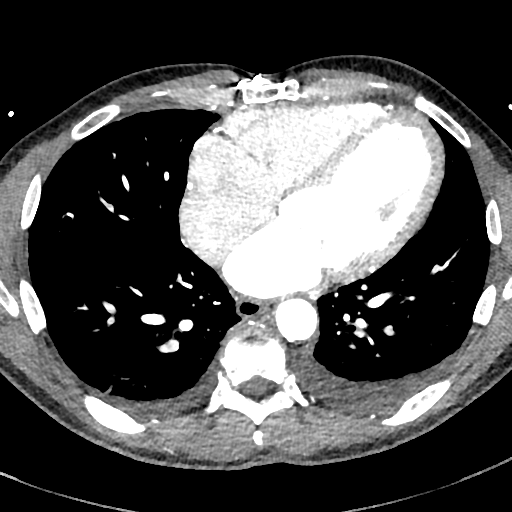
[im 508/653  lung]
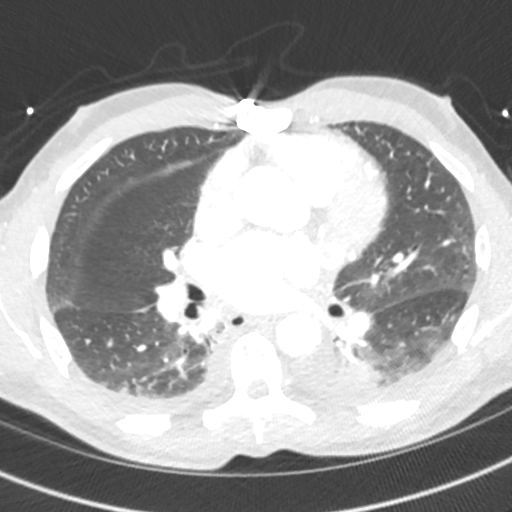
[im 580/653  mediastinal]
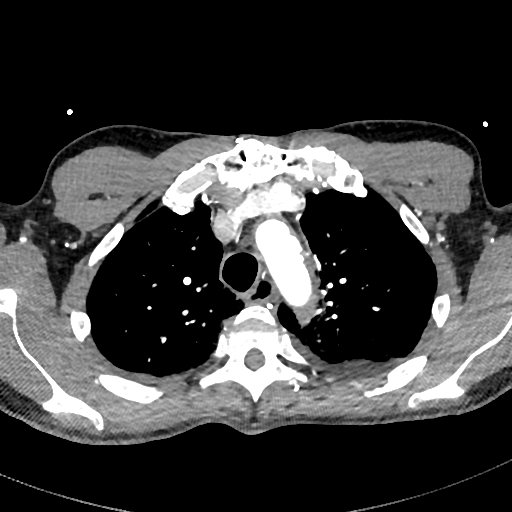
[im 616/653  lung]
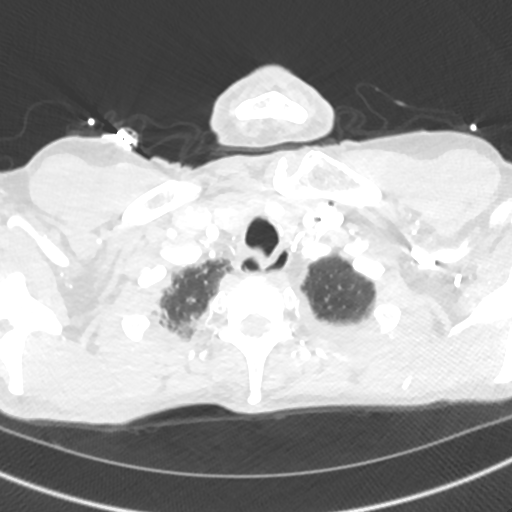

[Series 8: cor · coronal · 0.72mm/px · 1 of 151 slices shown]
[im 76/151  mediastinal]
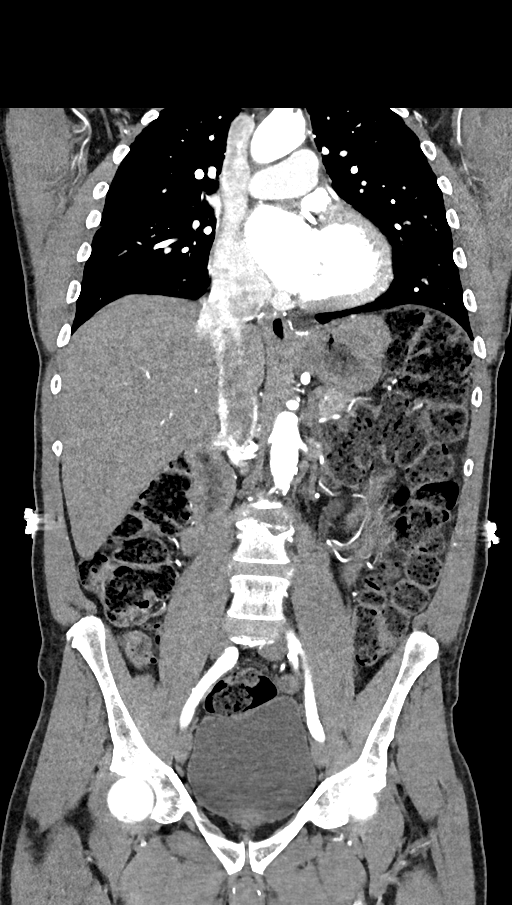

[14 of 36 positions shown; findings below may reference images not displayed]

FINDINGS: CTA CHEST FINDINGS

Cardiovascular: Heart size is mildly enlarged. There is no
significant pericardial fluid, thickening or pericardial
calcification. There is aortic atherosclerosis, as well as
atherosclerosis of the great vessels of the mediastinum and the
coronary arteries, including calcified atherosclerotic plaque in the
left main, left anterior descending, left circumflex and right
coronary arteries. Status post median sternotomy for CABG. Severe
thickening calcification of the aortic valve. Calcifications of the
anterior leaflet of the mitral valve.

Mediastinum/Lymph Nodes: No pathologically enlarged mediastinal or
hilar lymph nodes. Esophagus is unremarkable in appearance. No
axillary lymphadenopathy.

Lungs/Pleura: Small bilateral pleural effusions (left greater than
right) lying dependently. 5 mm subpleural nodule in the periphery of
the right lower lobe abutting the minor fissure (axial image 48 of
series 7), unchanged dating back to 7397, considered definitively
benign (presumably a subpleural lymph noted). No other suspicious
appearing pulmonary nodules or masses are noted. Some mild dependent
subsegmental atelectasis is noted in the lower lobes of the lungs
bilaterally. Small focus of nodular appearing probable airspace
consolidation in the superior segment of the left lower lobe,
concerning for pneumonia (axial image 46 of series 7).

Musculoskeletal/Soft Tissues: Median sternotomy wires. There are no
aggressive appearing lytic or blastic lesions noted in the
visualized portions of the skeleton.

CTA ABDOMEN AND PELVIS FINDINGS

Hepatobiliary: 9 mm hypervascular lesion in segment 8 of the liver
(axial image 90 of series 6). No other definite cystic or solid
hepatic lesions. No intra or extrahepatic biliary ductal dilatation.
Gallbladder is normal in appearance.

Pancreas: No pancreatic mass. No pancreatic ductal dilatation. No
pancreatic or peripancreatic fluid or inflammatory changes.

Spleen: Unremarkable.

Adrenals/Urinary Tract: 8 mm nonobstructive calculus in the lower
pole collecting system of the right kidney. A few tiny subcentimeter
low-attenuation lesions in the kidneys bilaterally are too small to
definitively characterize, but are statistically favored to
represent tiny cysts. No suspicious renal lesions. No
hydroureteronephrosis. Bilateral adrenal glands are normal in
appearance. Urinary bladder is normal in appearance.

Stomach/Bowel: Normal appearance of the stomach. No pathologic
dilatation of small bowel or colon. Very large volume of stool
throughout the colon, suggesting constipation. Normal appendix.

Vascular/Lymphatic: Aortic atherosclerosis with vascular findings
and measurements pertinent to potential TAVR procedure, as detailed
below. No aneurysm or dissection noted in the abdominal or pelvic
vasculature. No lymphadenopathy noted in the abdomen or pelvis.

Reproductive: Prostate gland and seminal vesicles are unremarkable
in appearance.

Other: No significant volume of ascites.  No pneumoperitoneum.

Musculoskeletal: There are no aggressive appearing lytic or blastic
lesions noted in the visualized portions of the skeleton.

VASCULAR MEASUREMENTS PERTINENT TO TAVR:

AORTA:

Minimal Aortic Biameter-U8 x 17 mm

Severity of Aortic Calcification-mild-to-moderate

RIGHT PELVIS:

Right Common Iliac Artery -

Minimal Diameter-LL.R x 9.9 mm

Tortuosity-mild

Calcification-moderate

Right External Iliac Artery -

Minimal 7iameter-W.C x 8.1 mm

Tortuosity - mild

Calcification - mild

Right Common Femoral Artery -

Minimal 3iameter-Q.Q x 8.5 mm

Tortuosity - mild

Calcification - mild

LEFT PELVIS:

Left Common Iliac Artery -

Minimal Xiameter-J7.2 x 10.2 mm

Tortuosity - mild

Calcification-mild

Left External Iliac Artery -

Minimal Ziameter-3.I x 8.7 mm

Tortuosity - mild

Calcification - mild

Left Common Femoral Artery -

Minimal Kiameter-X.4 x 8.8 mm

Tortuosity - mild

Calcification - mild

Review of the MIP images confirms the above findings.
IMPRESSION: 1. Vascular findings and measurements pertinent to potential TAVR
procedure, as detailed above.
2. Severe thickening calcification of the aortic valve, compatible
with the reported clinical history of severe aortic stenosis.
Nodular focus of what appears to be airspace consolidation in the
superior segment of the left lower lobe, concerning for a small
focus of pneumonia. The possibility of a true pulmonary nodule in
this region is not entirely excluded, but is not favored. Further
clinical evaluation is recommended, as well as follow-up noncontrast
chest CT in the 2-3 months to ensure resolution of this finding.
3. Aortic atherosclerosis, in addition to left main and 3 vessel
coronary artery disease. Status post median sternotomy for CABG.
4. Small bilateral pleural effusions (left greater than right) lying
dependently.
5. Mild cardiomegaly.
6. Small hypervascular lesion measuring 9 mm in segment 8 of the
liver. This is favored to be a benign perfusion anomaly or
potentially a flash fill cavernous hemangioma. This could be
definitively characterized with nonemergent MRI of the abdomen with
and without IV gadolinium if of clinical concern.
7. There nonobstructive calculus in the lower pole collecting system
of the right kidney.
8. Large volume of stool throughout the colon suggesting
constipation.

Aortic Atherosclerosis ([id number]-SU7.7).

## 2020-06-05 NOTE — Telephone Encounter (Signed)
-----   Message from Thompson Grayer, MD sent at 05/18/2020  2:09 PM EDT ----- Enroll in [facility] device clinic We discussed today and he is very interested.

## 2020-06-05 NOTE — Telephone Encounter (Signed)
Referred to ICM clinic by Dr Rayann Heman.   Attempted call to patient for ICM intro and no answer or voice mail option.

## 2020-06-12 DIAGNOSIS — Z23 Encounter for immunization: Secondary | ICD-10-CM | POA: Diagnosis not present

## 2020-07-14 ENCOUNTER — Telehealth: Payer: Self-pay

## 2020-07-14 NOTE — Telephone Encounter (Signed)
Opened in error

## 2020-07-14 NOTE — Telephone Encounter (Signed)
ICM call to patient.  Spoke with patient and wife.  He was agreeable to monthly follow up.  Advised monitor should be by bedside in order for it to automatically transmit a report during sleep hours of 12 midnight and 6 AM.  Patient confirmed monitor is at bedside. Advised will receive a call after the transmission is reviewed to provide results.  Explained a Remote Home Transmission will be seen as daytime appointment on office summary visit sheet but the time is not relevant since all transmissions are sent at night time so there is no obligation to stay by the monitor at that time appointed time during the day. Provided ICM number and explained should call if experiencing any fluid symptoms such as weight gain, shortness of breath or extremity/abdominal swelling. Patient takes PRN Furosemide when needed.  Wife is a retired Therapist, sports.  1st ICM remote transmission scheduled 08/13/2020.

## 2020-07-23 DIAGNOSIS — E039 Hypothyroidism, unspecified: Secondary | ICD-10-CM | POA: Diagnosis not present

## 2020-07-23 DIAGNOSIS — I10 Essential (primary) hypertension: Secondary | ICD-10-CM | POA: Diagnosis not present

## 2020-07-23 DIAGNOSIS — I2510 Atherosclerotic heart disease of native coronary artery without angina pectoris: Secondary | ICD-10-CM | POA: Diagnosis not present

## 2020-07-23 DIAGNOSIS — Z952 Presence of prosthetic heart valve: Secondary | ICD-10-CM | POA: Diagnosis not present

## 2020-07-23 DIAGNOSIS — N1831 Chronic kidney disease, stage 3a: Secondary | ICD-10-CM | POA: Diagnosis not present

## 2020-07-23 DIAGNOSIS — E7800 Pure hypercholesterolemia, unspecified: Secondary | ICD-10-CM | POA: Diagnosis not present

## 2020-07-23 DIAGNOSIS — E1122 Type 2 diabetes mellitus with diabetic chronic kidney disease: Secondary | ICD-10-CM | POA: Diagnosis not present

## 2020-07-23 DIAGNOSIS — G4700 Insomnia, unspecified: Secondary | ICD-10-CM | POA: Diagnosis not present

## 2020-07-27 DIAGNOSIS — E7800 Pure hypercholesterolemia, unspecified: Secondary | ICD-10-CM | POA: Diagnosis not present

## 2020-07-27 DIAGNOSIS — E039 Hypothyroidism, unspecified: Secondary | ICD-10-CM | POA: Diagnosis not present

## 2020-07-27 DIAGNOSIS — I10 Essential (primary) hypertension: Secondary | ICD-10-CM | POA: Diagnosis not present

## 2020-07-27 DIAGNOSIS — N189 Chronic kidney disease, unspecified: Secondary | ICD-10-CM | POA: Diagnosis not present

## 2020-07-27 DIAGNOSIS — N529 Male erectile dysfunction, unspecified: Secondary | ICD-10-CM | POA: Diagnosis not present

## 2020-07-27 DIAGNOSIS — E11319 Type 2 diabetes mellitus with unspecified diabetic retinopathy without macular edema: Secondary | ICD-10-CM | POA: Diagnosis not present

## 2020-07-27 DIAGNOSIS — E1165 Type 2 diabetes mellitus with hyperglycemia: Secondary | ICD-10-CM | POA: Diagnosis not present

## 2020-07-27 DIAGNOSIS — C3490 Malignant neoplasm of unspecified part of unspecified bronchus or lung: Secondary | ICD-10-CM | POA: Diagnosis not present

## 2020-08-09 ENCOUNTER — Other Ambulatory Visit: Payer: Self-pay | Admitting: Cardiology

## 2020-08-12 ENCOUNTER — Ambulatory Visit (INDEPENDENT_AMBULATORY_CARE_PROVIDER_SITE_OTHER): Payer: Medicare Other

## 2020-08-12 DIAGNOSIS — Z95810 Presence of automatic (implantable) cardiac defibrillator: Secondary | ICD-10-CM

## 2020-08-12 DIAGNOSIS — I5022 Chronic systolic (congestive) heart failure: Secondary | ICD-10-CM

## 2020-08-12 DIAGNOSIS — I447 Left bundle-branch block, unspecified: Secondary | ICD-10-CM

## 2020-08-12 DIAGNOSIS — I255 Ischemic cardiomyopathy: Secondary | ICD-10-CM | POA: Diagnosis not present

## 2020-08-12 LAB — CUP PACEART REMOTE DEVICE CHECK
Battery Remaining Longevity: 98 mo
Battery Remaining Percentage: 92 %
Battery Voltage: 3.14 V
Brady Statistic AP VP Percent: 0 %
Brady Statistic AP VS Percent: 1 %
Brady Statistic AS VP Percent: 1 %
Brady Statistic AS VS Percent: 99 %
Brady Statistic RA Percent Paced: 1 %
Brady Statistic RV Percent Paced: 1 %
Date Time Interrogation Session: 20211229040016
HighPow Impedance: 56 Ohm
HighPow Impedance: 56 Ohm
Implantable Lead Implant Date: 20210629
Implantable Lead Implant Date: 20210629
Implantable Lead Location: 753859
Implantable Lead Location: 753860
Implantable Pulse Generator Implant Date: 20210629
Lead Channel Impedance Value: 450 Ohm
Lead Channel Impedance Value: 530 Ohm
Lead Channel Pacing Threshold Amplitude: 0.5 V
Lead Channel Pacing Threshold Amplitude: 0.75 V
Lead Channel Pacing Threshold Pulse Width: 0.5 ms
Lead Channel Pacing Threshold Pulse Width: 0.5 ms
Lead Channel Sensing Intrinsic Amplitude: 12 mV
Lead Channel Sensing Intrinsic Amplitude: 2.1 mV
Lead Channel Setting Pacing Amplitude: 2 V
Lead Channel Setting Pacing Amplitude: 2.5 V
Lead Channel Setting Pacing Pulse Width: 0.5 ms
Lead Channel Setting Sensing Sensitivity: 0.5 mV
Pulse Gen Serial Number: 9932570

## 2020-08-12 NOTE — Progress Notes (Signed)
EPIC Encounter for ICM Monitoring  Patient Name: [name] is a 69 y.o. male Date: 08/12/2020 Primary Care Physican: [name], MD Primary Cardiologist: Radford Pax Electrophysiologist: Allred 08/12/2020 Weight: 161 lbs        1st ICM Remote Transmission. Heart Failure questions reviewed.  Pt asymptomatic.   CorVue thoracic impedance suggesting possible fluid accumulation starting 08/08/20.   Prescribed:  Furosemide 20 mg take 1 tablet daily.  08/12/20 reports taking Furosemide PRN instead of daily  Labs: 02/07/2020 Creatinine 1.10, BUN 16, Potassium 4.2, Sodium 139, GFR 68-78 09/06/2019 Creatinine 1.31, BUN 23, Potassium 4.4, Sodium 136, GFR 55-63  A complete set of results can be found in Results Review.  Recommendations:  He took Furosemide this AM and will take 1 tablet daily x 2 more days.    Follow-up plan: ICM clinic phone appointment on 08/22/2019 to recheck fluid levels.   91 day device clinic remote transmission 11/11/2020.    EP/Cardiology Office Visits: 1/19/202 with Dr. Radford Pax.    Copy of ICM check sent to Dr. Rayann Heman and Dr Radford Pax.   3 month ICM trend: 08/12/2020    1 Year ICM trend:       [name], RN 08/12/2020 2:42 PM

## 2020-08-21 ENCOUNTER — Ambulatory Visit (INDEPENDENT_AMBULATORY_CARE_PROVIDER_SITE_OTHER): Payer: Medicare Other

## 2020-08-21 DIAGNOSIS — I5022 Chronic systolic (congestive) heart failure: Secondary | ICD-10-CM | POA: Diagnosis not present

## 2020-08-21 DIAGNOSIS — Z95810 Presence of automatic (implantable) cardiac defibrillator: Secondary | ICD-10-CM

## 2020-08-24 ENCOUNTER — Telehealth: Payer: Self-pay

## 2020-08-24 NOTE — Progress Notes (Signed)
EPIC Encounter for ICM Monitoring  Patient Name: [name] is a 70 y.o. male Date: 08/24/2020 Primary Care Physican: [name], MD Primary Cardiologist: Radford Pax Electrophysiologist: Allred 08/12/2020 Weight: 161 lbs                                                            Attempted call to patient and unable to reach.  Transmission reviewed.    CorVue thoracic impedance suggesting fluid levels returned to normal after taking Furosemide daily x 3 days (impedance decreased 08/08/20 - 12/30//2021).   Prescribed:  Furosemide 20 mg take 1 tablet daily.  08/12/20 reports taking Furosemide PRN instead of daily  Labs: 02/07/2020 Creatinine 1.10, BUN 16, Potassium 4.2, Sodium 139, GFR 68-78 09/06/2019 Creatinine 1.31, BUN 23, Potassium 4.4, Sodium 136, GFR 55-63  A complete set of results can be found in Results Review.  Recommendations:  Unable to reach.    Follow-up plan: ICM clinic phone appointment on 09/28/2020.   91 day device clinic remote transmission 11/11/2020.    EP/Cardiology Office Visits: 09/02/2020 with Dr. Radford Pax.    Copy of ICM check sent to Dr. Rayann Heman.  3 month ICM trend: 08/21/2020.    1 Year ICM trend:       [name], RN 08/24/2020 1:47 PM

## 2020-08-24 NOTE — Telephone Encounter (Signed)
Remote ICM transmission received.  Attempted call to patient regarding ICM remote transmission and no answer or answering machine. 

## 2020-08-26 NOTE — Progress Notes (Signed)
Remote ICD transmission.   

## 2020-09-02 ENCOUNTER — Encounter: Payer: Self-pay | Admitting: Cardiology

## 2020-09-02 ENCOUNTER — Ambulatory Visit (INDEPENDENT_AMBULATORY_CARE_PROVIDER_SITE_OTHER): Payer: Medicare Other | Admitting: Cardiology

## 2020-09-02 ENCOUNTER — Other Ambulatory Visit: Payer: Self-pay

## 2020-09-02 VITALS — BP 120/70 | HR 80 | Ht 70.5 in | Wt 165.6 lb

## 2020-09-02 DIAGNOSIS — I10 Essential (primary) hypertension: Secondary | ICD-10-CM

## 2020-09-02 DIAGNOSIS — E785 Hyperlipidemia, unspecified: Secondary | ICD-10-CM | POA: Diagnosis not present

## 2020-09-02 DIAGNOSIS — I350 Nonrheumatic aortic (valve) stenosis: Secondary | ICD-10-CM | POA: Diagnosis not present

## 2020-09-02 DIAGNOSIS — R000 Tachycardia, unspecified: Secondary | ICD-10-CM | POA: Diagnosis not present

## 2020-09-02 DIAGNOSIS — I2510 Atherosclerotic heart disease of native coronary artery without angina pectoris: Secondary | ICD-10-CM | POA: Diagnosis not present

## 2020-09-02 DIAGNOSIS — I5022 Chronic systolic (congestive) heart failure: Secondary | ICD-10-CM | POA: Diagnosis not present

## 2020-09-02 NOTE — Patient Instructions (Signed)

## 2020-09-02 NOTE — Progress Notes (Signed)
Cardiology Office Note:    Date:  09/02/2020   ID:  [name], DOB [birth year], MRN [id number]  PCP:  [name], MD  Cardiologist:  Fransico Him, MD    Referring MD: [name], MD   Chief Complaint  Patient presents with  . Coronary Artery Disease  . Hypertension  . Hyperlipidemia  . Congestive Heart Failure    History of Present Illness:    [name] is a 70 y.o. male with a hx of CAD, aortic stenosis and systolic HF. Hehad anon-STEMI in 2013 treated with PCI of the LCx/OM. LHC in 5/14 demonstrated severe 2 vessel CAD with critical in-stent restenosis in the LCx. He underwent CABG (SVG-PDA, SVG-LCx). He also has a history of aortic stenosis. Echo 10/2016 showed moderately reduced LVF at 35-40% with diffuse HK, mild AS and moderate AR. Right and Left heart cath showed mild to moderate AS with AVA 0.837cm2 and EF 45% with patent SVG to RPDA and SVG to OM1 With occluded native vessels. He ultimately was diagnosed with low gradient aortic stenosis with AI and underwent TAVR with a 29 mm EdwardsSapein3 THV via transfemoral approach after being admitted with acute exacerbation of CHF on 05/15/2018.  His baseline weightin the past had been around 162lbs butover the past few months has been running about 154lbs. He has not tolerated Entresto due to hypotension. He has been on low dose BB. He only take Lasix PRN for weight >165lbs and has only been taking recently about twice weekly. He recently saw Dr. Rayann Heman after repeat 2D echo showed persistently reduced LVF with EF 20-25%. Due to active lung Ca he was felt not to be a candidate for ICD but Dr. Rayann Heman felt he was a candidate for CRT-D but patient did not want to pursue at that time.  He was also found to have SVT which was unclear whether sinus in origin or atrial tach.  He was already on Toprol which was increased to 75mg  daily.  2D echo and lexiscan myoview were ordered but cancelled on followup study as HR  was still not under control and wanted to HR controlled better first.  A ddimer was elevated and CHest CTA was neg for PE but showed LLL atelectasis vs. PNA with early interstitial edema.  He was started on Lasix 40mg  BID for 3 days.  He was seen back by Melina Copa, PA and EKG showed persistence of SVT.  He was started on Corlanor.  It was felt that possibly his CHF was exacerbated by his tachycardia. His weight was down 5lbs at that OV and his Lasix was decreased to 20mg  daily with 2gm Na diet and 2L fluid restriction.  He ultimately underwent BiV-ICD implant on 02/11/2020 by Dr. Rayann Heman for persistent LV dysfunction with EF <20%.  He is here today for followup and is doing well.  He denies any chest pain or pressure, SOB, DOE, PND, orthopnea, LE edema, palpitations or syncope. He has noticed some dizziness from time to time which he describes as losing focus for a few seconds and resolves.  He is compliant with his meds and is tolerating meds with no SE.    Past Medical History:  Diagnosis Date  . AICD (automatic cardioverter/defibrillator) present 02/11/2020  . Anemia   . Arthritis   . Atrial tachycardia ([place])   . Chronic systolic CHF (congestive heart failure) ([place]) 07/04/2018  . CKD (chronic kidney disease), stage III ([place])   . Coronary artery disease 02/2012   a.  s/p stenting in 2013  b. s/p CABGx2V (SVG--> PDA, SVG--> LCx).  . Diabetes mellitus    neuropathy  insulin dependent  . Dilated aortic root ([place])   . Dyslipidemia   . Erectile dysfunction   . GERD (gastroesophageal reflux disease)   . History of CVA (cerebrovascular accident)   . History of kidney stones   . Hx of radiation therapy to mediastinum 1985  . Hypertension   . 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 OSA (obstructive sleep apnea) 07/04/2018   Severe obstructive sleep apnea with an AHI of 30/h and mild central sleep apnea at 13.7/h with oxygen desaturations as low as 79%.  Intolerant to PAP therapy  . S/P  TAVR (transcatheter aortic valve replacement) 05/15/2018   29 mm Edwards Sapien 3 transcatheter heart valve placed via percutaneous right transfemoral approach   . Severe aortic stenosis     Past Surgical History:  Procedure Laterality Date  . BIV ICD INSERTION CRT-D N/A 02/11/2020   Procedure: BIV ICD INSERTION CRT-D;  Surgeon: Thompson Grayer, MD;  Location: [facility];  Service: Cardiovascular;  Laterality: N/A;  . COLONOSCOPY  07/25/2012   Procedure: COLONOSCOPY;  Surgeon: Winfield Cunas., MD;  Location: [facility] ENDOSCOPY;  Service: Endoscopy;  Laterality: N/A;  . COLONOSCOPY N/A 12/02/2013   Procedure: COLONOSCOPY;  Surgeon: Winfield Cunas., MD;  Location: [facility];  Service: Endoscopy;  Laterality: N/A;  . CORONARY ARTERY BYPASS GRAFT N/A 01/04/2013   Procedure: CORONARY ARTERY BYPASS GRAFTING (CABG) times two using right saphenous vein harvested with endoscope.;  Surgeon: Ivin Poot, MD;  Location: [facility];  Service: Open Heart Surgery;  Laterality: N/A;  . ESOPHAGOGASTRODUODENOSCOPY  07/20/2012   Procedure: ESOPHAGOGASTRODUODENOSCOPY (EGD);  Surgeon: Winfield Cunas., MD;  Location: [facility] ENDOSCOPY;  Service: Endoscopy;  Laterality: N/A;  . ESOPHAGOGASTRODUODENOSCOPY N/A 12/02/2013   Procedure: ESOPHAGOGASTRODUODENOSCOPY (EGD);  Surgeon: Winfield Cunas., MD;  Location: [facility];  Service: Endoscopy;  Laterality: N/A;  . HOT HEMOSTASIS N/A 12/02/2013   Procedure: HOT HEMOSTASIS (ARGON PLASMA COAGULATION/BICAP);  Surgeon: Winfield Cunas., MD;  Location: [facility];  Service: Endoscopy;  Laterality: N/A;  . INTRAOPERATIVE TRANSESOPHAGEAL ECHOCARDIOGRAM N/A 01/04/2013   Procedure: INTRAOPERATIVE TRANSESOPHAGEAL ECHOCARDIOGRAM;  Surgeon: Ivin Poot, MD;  Location: [facility];  Service: Open Heart Surgery;  Laterality: N/A;  . INTRAOPERATIVE TRANSTHORACIC ECHOCARDIOGRAM N/A 05/15/2018   Procedure: INTRAOPERATIVE TRANSTHORACIC ECHOCARDIOGRAM;  Surgeon: Burnell Blanks, MD;  Location: [facility];  Service: Open Heart Surgery;  Laterality: N/A;  . IR THORACENTESIS ASP PLEURAL SPACE W/IMG GUIDE  10/19/2018  . LEFT HEART CATHETERIZATION WITH CORONARY ANGIOGRAM N/A 03/06/2012   Procedure: LEFT HEART CATHETERIZATION WITH CORONARY ANGIOGRAM;  Surgeon: [name], MD;  Location: [facility];  Service: Cardiovascular;  Laterality: N/A;  . LITHOTRIPSY    . [place]  . RADIAL ARTERY HARVEST Left 01/04/2013   Procedure: RADIAL ARTERY HARVEST;  Surgeon: Ivin Poot, MD;  Location: [facility];  Service: Vascular;  Laterality: Left;  Artery not havested. Unsuitable for use.  . resection mediastinal seminonma    . RIGHT/LEFT HEART CATH AND CORONARY/GRAFT ANGIOGRAPHY N/A 11/15/2016   Procedure: Right/Left Heart Cath and Coronary/Graft Angiography;  Surgeon: Leonie Man, MD;  Location: [facility];  Service: Cardiovascular;  Laterality: N/A;  . RIGHT/LEFT HEART CATH AND CORONARY/GRAFT ANGIOGRAPHY N/A 04/13/2018   Procedure: RIGHT/LEFT HEART CATH AND CORONARY/GRAFT ANGIOGRAPHY;  Surgeon: Jolaine Artist, MD;  Location: [facility];  Service: Cardiovascular;  Laterality: N/A;  . TRANSCATHETER AORTIC VALVE REPLACEMENT, TRANSFEMORAL  05/15/2018  . TRANSCATHETER AORTIC VALVE REPLACEMENT, TRANSFEMORAL N/A 05/15/2018   Procedure: TRANSCATHETER AORTIC VALVE REPLACEMENT, TRANSFEMORAL;  Surgeon: Burnell Blanks, MD;  Location: [facility];  Service: Open Heart Surgery;  Laterality: N/A;    Current Medications: Current Meds  Medication Sig  . amitriptyline (ELAVIL) 25 MG tablet Take 25 mg by mouth at bedtime.   Marland Kitchen amoxicillin (AMOXIL) 500 MG capsule TAKE 4 CAPSULES BY MOUTH 1 HOUR PRIOR TO DENTAL PROCEDURES  . aspirin 81 MG chewable tablet Chew 1 tablet (81 mg total) by mouth daily.  . B Complex-C (SUPER B COMPLEX PO) Take 1 tablet by mouth daily.  . B-D ULTRAFINE III SHORT PEN 31G X 8 MM MISC 1 each by Other route daily as needed (GLUCOSE  TESTING (INSULINE NEEDLE)).   . furosemide (LASIX) 20 MG tablet Take 1 tablet (20 mg total) by mouth daily.  . insulin detemir (LEVEMIR) 100 UNIT/ML injection Inject 10-18 Units into the skin See admin instructions. 18 units in the morning, 10 units at bedtime  . insulin lispro (HUMALOG) 100 UNIT/ML KwikPen Inject 2-3 Units into the skin See admin instructions. Per sliding scale at [employer] and [employer]  . ivabradine (CORLANOR) 5 MG TABS tablet Take 1 tablet (5 mg total) by mouth 2 (two) times daily with a meal.  . levothyroxine (SYNTHROID, LEVOTHROID) 50 MCG tablet Take 50 mcg by mouth daily before breakfast.   . losartan (COZAAR) 25 MG tablet TAKE 1 TABLET(25 MG) BY MOUTH DAILY  . metFORMIN (GLUCOPHAGE) 500 MG tablet Take 2 tablets (1,000 mg total) by mouth 2 (two) times daily with a meal.  . metoprolol succinate (TOPROL-XL) 25 MG 24 hr tablet Take 75 mg by mouth daily.  . Multiple Vitamin (MULTIVITAMIN) tablet Take 1 tablet by mouth daily.  . ONE TOUCH ULTRA TEST test strip 1 each by Other route as needed for other.   . simvastatin (ZOCOR) 20 MG tablet Take 20 mg by mouth every evening.   . zolpidem (AMBIEN) 5 MG tablet Take 2.5 mg by mouth at bedtime.     Allergies:   Lipitor [atorvastatin] and Adhesive [tape]   Social History   Socioeconomic History  . Marital status: Married    Spouse name: Not on file  . Number of children: Not on file  . Years of education: Not on file  . Highest education level: Not on file  Occupational History  . Not on file  Tobacco Use  . Smoking status: Former Smoker    Quit date: 03/06/1985    Years since quitting: 35.5  . Smokeless tobacco: Never Used  Vaping Use  . Vaping Use: Never used  Substance and Sexual Activity  . Alcohol use: No  . Drug use: No  . Sexual activity: Not Currently  Other Topics Concern  . Not on file  Social History Narrative  . Not on file   Social Determinants of Health   Financial Resource Strain: Not on file  Food  Insecurity: Not on file  Transportation Needs: Not on file  Physical Activity: Not on file  Stress: Not on file  Social Connections: Not on file     Family History: The patient's family history includes Cancer in his brother; Diabetes in his father, mother, and sister; Heart failure in his mother; Hypertension in his mother.  ROS:   Please see the history of present illness.  Review of Systems  Musculoskeletal: Negative for falls.    All other systems reviewed and negative.   EKGs/Labs/Other Studies Reviewed:    The following studies were reviewed today: none  EKG:  EKG is not ordered today.    Recent Labs: 09/06/2019: Magnesium 2.1; NT-Pro BNP 2,117; TSH 5.660 02/07/2020: BUN 16; Creatinine, Ser 1.10; Hemoglobin 13.2; Platelets 189; Potassium 4.2; Sodium 139   Recent Lipid Panel    Component Value Date/Time   CHOL 127 06/13/2019 0846   TRIG 47 06/13/2019 0846   HDL 42 06/13/2019 0846   CHOLHDL 3.0 06/13/2019 0846   CHOLHDL 2.9 04/12/2018 0216   VLDL 7 04/12/2018 0216   LDLCALC 74 06/13/2019 0846    Physical Exam:    VS:  BP 120/70   Pulse 80   Ht 5' 10.5" (1.791 m)   Wt 165 lb 9.6 oz (75.1 kg)   SpO2 98%   BMI 23.43 kg/m     Wt Readings from Last 3 Encounters:  09/02/20 165 lb 9.6 oz (75.1 kg)  05/18/20 160 lb 9.6 oz (72.8 kg)  05/04/20 157 lb 8 oz (71.4 kg)    GEN: Well nourished, well developed in no acute distress HEENT: Normal NECK: No JVD; No carotid bruits LYMPHATICS: No lymphadenopathy CARDIAC:RRR, no murmurs, rubs, gallops RESPIRATORY:  Clear to auscultation without rales, wheezing or rhonchi  ABDOMEN: Soft, non-tender, non-distended MUSCULOSKELETAL:  No edema; No deformity  SKIN: Warm and dry NEUROLOGIC:  Alert and oriented x 3 PSYCHIATRIC:  Normal affect    ASSESSMENT:    1. Chronic systolic CHF (congestive heart failure) ([place])   2. Coronary artery disease involving native coronary artery of native heart without angina pectoris   3.  Primary hypertension   4. Aortic stenosis, severe   5. Dyslipidemia   6. Tachycardia    PLAN:    In order of problems listed above:  1.Chronic systolic CHF -His baseline weight154- 157lbs  -His echo in 2019 showed moderate to severely reduced LV function with EF 30 to 35% with hypokinesis of the anterior septum and inferior wall. Repeat echo 01/2019 showed EF 20-25% and 01/2020 < 20% -S/P BiV-AICD June2021  -he his NYYHA Class 2a  -he does not appear volume overloaded on exam today -He did not tolerate Entresto due to hypotension and is on Losartan -will continue on Lasix 20mg  daily, Corlanor 5mg  BID, Losartan 25mg  daily, Toprol XL 75mg  daily  2.ASCAD -s/p non-STEMI in 2013 treated with PCI of the LCx/OM. -LHC in 5/14 demonstrated severe 2 vessel CAD with critical in-stent restenosis in the LCx.  -He underwent CABG (SVG-PDA, SVG-LCx). -Repeat left heart cath showed patent SVG to RPDA, SVG to OM1, 50% ostial D1, 40% proximal to mid LAD, occluded ostial RCA, occluded ostial left circumflex, 40% ostial LAD. -he has not had any anginal chest pain -Continue ASA, BB and statin  3. Hypertension  -BP well controlled on exam today -continue Toprol XL 75mg  daily and Losartan 25mg  daily  4.Severe low gradient low outputAS -S/P TAVR 10-1-19with29 mm EdwardsSapein3 THV via transfemoral approach. -2D echo10/21/2020showed a stable aortic valve TAVR withtrivialperivalvular AR. Mean aortic valve gradient45mmHg. -He will continue on ASA 81mg  daily.   5. Hyperlipidemia -his LDL goal is less than 70. -LDL was elevated at 115 in Dec 2021 -His LDL was cut back to 20mg  daily by his PCP which I am not sure why>>I will call his PCP to find out why this was decreased.    6. Tachycardia -maintaining NSR with  HR 80bpm today on Corlanor 5mg  BID -continue Toprol XL to 75mg  daily and Corlanor -TSH was normal   Medication Adjustments/Labs and Tests  Ordered: Current medicines are reviewed at length with the patient today.  Concerns regarding medicines are outlined above.  No orders of the defined types were placed in this encounter.  No orders of the defined types were placed in this encounter.   Signed, Fransico Him, MD  09/02/2020 9:27 AM    [place]

## 2020-09-18 ENCOUNTER — Telehealth: Payer: Self-pay

## 2020-09-18 NOTE — Telephone Encounter (Signed)
Spoke with the patient who is requesting a letter stating that he is clear from a cardiac standpoint to return to work. Patient has been out of work for a couple years - works in [employer] and would like to go back to working 1st shift.

## 2020-09-20 NOTE — Telephone Encounter (Signed)
Patient is stable from a cardiac standpoint to return to work as a Presenter, broadcasting  Fransico Him, MD

## 2020-09-21 NOTE — Telephone Encounter (Signed)
Spoke with the patient and advised him that a letter was left for him at the office and he can come pick it up. Patient verbalized understanding.

## 2020-09-28 ENCOUNTER — Ambulatory Visit (INDEPENDENT_AMBULATORY_CARE_PROVIDER_SITE_OTHER): Payer: Medicare Other

## 2020-09-28 DIAGNOSIS — I5022 Chronic systolic (congestive) heart failure: Secondary | ICD-10-CM | POA: Diagnosis not present

## 2020-09-28 DIAGNOSIS — Z95810 Presence of automatic (implantable) cardiac defibrillator: Secondary | ICD-10-CM

## 2020-09-29 NOTE — Progress Notes (Signed)
EPIC Encounter for ICM Monitoring  Patient Name: [name] is a 70 y.o. male Date: 09/29/2020 Primary Care Physican: [name], MD Primary Cardiologist:Turner Electrophysiologist:Allred 09/29/2020 Weight:160lbs    Spoke with wife due to patient was not home.  She reports he ate foods higher in salt this past weekend due to super bowl.  He has resumed low salt diet.  CorVue thoracic impedance suggesting possible fluid accumulation starting 09/26/2020.  Prescribed:  Furosemide20 mg take 1 tablet daily. Taking differently: Taking Furosemide PRN instead of daily  Labs: 02/07/2020 Creatinine1.10, BUN16, Potassium4.2, Sodium139, [id number] 09/06/2019 Creatinine1.31, BUN23, Potassium4.4, Sodium136, [id number] A complete set of results can be found in Results Review.  Recommendations:Advised to take PRN Furosemide 1 tablet x 1-2 days.   Follow-up plan: ICM clinic phone appointment on2/23/2022 to recheck fluid levels. 91 day device clinic remote transmission 11/11/2020.   EP/Cardiology Office Visits:N/A  Copy of ICM check sent to Dr.Allred and Dr Radford Pax.  3 month ICM trend: 09/28/2020.    1 Year ICM trend:       [name], RN 09/29/2020 2:49 PM

## 2020-10-07 ENCOUNTER — Ambulatory Visit (INDEPENDENT_AMBULATORY_CARE_PROVIDER_SITE_OTHER): Payer: Medicare Other

## 2020-10-07 ENCOUNTER — Telehealth: Payer: Self-pay

## 2020-10-07 DIAGNOSIS — I5022 Chronic systolic (congestive) heart failure: Secondary | ICD-10-CM

## 2020-10-07 DIAGNOSIS — Z95810 Presence of automatic (implantable) cardiac defibrillator: Secondary | ICD-10-CM

## 2020-10-07 NOTE — Progress Notes (Signed)
EPIC Encounter for ICM Monitoring  Patient Name: [name] is a 70 y.o. male Date: 10/07/2020 Primary Care Physican: [name], MD Primary Cardiologist:Turner Electrophysiologist:Allred 09/29/2020 Weight:160lbs    Attempted call to patient and unable to reach.   Transmission reviewed.   CorVue thoracic impedance suggestingfluid levels returned to normal after taking extra Furosemide.   Prescribed:  Furosemide20 mg take 1 tablet daily. Taking differently: Taking Furosemide PRN instead of daily  Labs: 02/07/2020 Creatinine1.10, BUN16, Potassium4.2, Sodium139, [id number] 09/06/2019 Creatinine1.31, BUN23, Potassium4.4, Sodium136, [id number] A complete set of results can be found in Results Review.  Recommendations:Unable to reach.    Follow-up plan: ICM clinic phone appointment on3/21/2022. 91 day device clinic remote transmission 11/11/2020.   EP/Cardiology Office Visits: Recall for 11/15/2020 with Oda Kilts, [place] and 05/14/2021 with Dr Rayann Heman.  Recall 08/28/2021 with Dr Radford Pax.  Copy of ICM check sent to Dr.Allred.  3 month ICM trend: 10/07/2020.    1 Year ICM trend:       [name], RN 10/07/2020 12:55 PM

## 2020-10-07 NOTE — Telephone Encounter (Signed)
Remote ICM transmission received.  Attempted call to patient regarding ICM remote transmission and no answer and no option to leave voice mail.

## 2020-10-21 ENCOUNTER — Telehealth: Payer: Self-pay | Admitting: *Deleted

## 2020-10-21 NOTE — Telephone Encounter (Signed)
CALLED PATIENT TO INFORM OF CT ON 10-30-20- ARRIVAL TIME- 2:15 PM @ [facility], NO RESTRICTIONS TO TEST, PATIENT TO RECEIVE CT RESULTS FROM DR. KINARD ON 11-02-20 @ 10 AM, SPOKE WITH PATIENT AND HE IS AWARE OF THESE APPTS.

## 2020-10-27 ENCOUNTER — Encounter: Payer: Self-pay | Admitting: Radiology

## 2020-10-30 ENCOUNTER — Encounter (HOSPITAL_COMMUNITY): Payer: Self-pay

## 2020-10-30 ENCOUNTER — Ambulatory Visit (HOSPITAL_COMMUNITY)
Admission: RE | Admit: 2020-10-30 | Discharge: 2020-10-30 | Disposition: A | Discharge status: Home / Self Care | Payer: Medicare Other | Source: Ambulatory Visit | Attending: Radiation Oncology | Admitting: Radiation Oncology

## 2020-10-30 ENCOUNTER — Other Ambulatory Visit: Payer: Self-pay

## 2020-10-30 DIAGNOSIS — C3432 Malignant neoplasm of lower lobe, left bronchus or lung: Secondary | ICD-10-CM | POA: Insufficient documentation

## 2020-10-30 DIAGNOSIS — C3490 Malignant neoplasm of unspecified part of unspecified bronchus or lung: Secondary | ICD-10-CM | POA: Diagnosis not present

## 2020-11-01 NOTE — Progress Notes (Signed)
Radiation Oncology         [phone] ________________________________  Name: [name] MRN: [id number]  Date: 11/02/2020  DOB: 06-09-1951  Follow-Up Visit Note  CC: [name], MD  [name], MD    ICD-10-CM   1. 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 Details of this treatment are not available. The patient reports having several weeks of radiation therapy. He reports his treatments were given at [facility].  Narrative:  The patient returns today for routine follow-up. Since his last visit, he underwent a chest CT scan on 10/30/2020 that showed stable radiation fibrosis involving the left infrahilar lung. There were no findings suspicious for recurrent tumor. Emphysematous changes, pulmonary scarring, and 6 mm right upper lobe pulmonary nodule were all stable. There were no findings suspicious for pulmonary metastatic disease.  On review of systems, he reports feeling well without any new medical issues. He denies significant cough, chest pain shortness of breath or hemoptysis.  He denies any new bony pain headaches or blurred vision..  ALLERGIES:  is allergic to lipitor [atorvastatin] and adhesive [tape].  Meds: Current Outpatient Medications  Medication Sig Dispense Refill  . amitriptyline (ELAVIL) 25 MG tablet Take 25 mg by mouth at bedtime.     . B Complex-C (SUPER B COMPLEX PO) Take 1 tablet by mouth daily.    . B-D ULTRAFINE III SHORT PEN 31G X 8 MM MISC 1 each by Other route daily as needed (GLUCOSE TESTING (INSULINE NEEDLE)).     . furosemide (LASIX) 20 MG tablet Take 1 tablet (20 mg total) by mouth  daily. 90 tablet 3  . insulin detemir (LEVEMIR) 100 UNIT/ML injection Inject 10-18 Units into the skin See admin instructions. 18 units in the morning, 10 units at bedtime    . insulin lispro (HUMALOG) 100 UNIT/ML KwikPen Inject 2-3 Units into the skin See admin instructions. Per sliding scale at [employer] and [employer]    . ivabradine (CORLANOR) 5 MG TABS tablet Take 1 tablet (5 mg total) by mouth 2 (two) times daily with a meal. 60 tablet 9  . levothyroxine (SYNTHROID, LEVOTHROID) 50 MCG tablet Take 50 mcg by mouth daily before breakfast.     . losartan (COZAAR) 25 MG tablet TAKE 1 TABLET(25 MG) BY MOUTH DAILY 90 tablet 1  . metFORMIN (GLUCOPHAGE) 500 MG tablet Take 2 tablets (1,000 mg total) by mouth 2 (two) times daily with a meal.    . metoprolol succinate (TOPROL-XL) 25 MG 24 hr tablet Take 75 mg by mouth daily.    . Multiple Vitamin (MULTIVITAMIN) tablet Take 1 tablet by mouth daily.    . Omega-3 Fatty Acids (FISH OIL PO) Take by mouth.    . ONE TOUCH ULTRA TEST test strip 1 each by Other route as needed for other.     . simvastatin (ZOCOR) 20 MG tablet Take 20 mg by mouth every evening.     . zolpidem (AMBIEN) 5 MG tablet Take 2.5 mg by mouth at bedtime.    Marland Kitchen amoxicillin (AMOXIL) 500 MG capsule TAKE 4 CAPSULES BY MOUTH 1 HOUR PRIOR TO DENTAL  PROCEDURES (Patient not taking: Reported on 11/02/2020) 4 capsule 3   No current facility-administered medications for this encounter.    Physical Findings: The patient is in no acute distress. Patient is alert and oriented.  height is 5' 10.5" (1.791 m) and weight is 169 lb 6 oz (76.8 kg). His temporal temperature is 97.2 F (36.2 C) (abnormal). His blood pressure is 111/63 and his pulse is 82. His respiration is 18 and oxygen saturation is 100%.   Lungs are clear to auscultation bilaterally. Heart has regular rate and rhythm. No palpable cervical, supraclavicular, or axillary adenopathy. Abdomen soft, non-tender, normal bowel sounds.  Defibrillator in  place in the left upper chest region.   Lab Findings: Lab Results  Component Value Date   WBC 4.1 02/07/2020   HGB 13.2 02/07/2020   HCT 39.4 02/07/2020   MCV 77 (L) 02/07/2020   PLT 189 02/07/2020    Radiographic Findings: CT Chest Wo Contrast  Result Date: 10/31/2020 CLINICAL DATA:  Non-small cell lung cancer. EXAM: CT CHEST WITHOUT CONTRAST TECHNIQUE: Multidetector CT imaging of the chest was performed following the standard protocol without IV contrast. COMPARISON:  Multiple prior chest CTs. The most recent is 04/30/2020 FINDINGS: Cardiovascular: The heart is normal in size. No pericardial effusion. Stable tortuosity, ectasia and calcification of the thoracic aorta. Stable surgical changes from coronary artery bypass surgery. Dense three-vessel coronary artery calcifications are again noted. Surgical changes from aortic valve repair. Mediastinum/Nodes: Small scattered mediastinal and hilar lymph nodes appears stable. No mass or overt adenopathy. The esophagus is grossly normal. Lungs/Pleura: Stable underlying emphysematous changes and pulmonary scarring. Stable area of dense radiation fibrosis involving the left infrahilar lung. No findings suspicious for recurrent tumor. Stable basilar scarring changes. No pleural effusions or pleural nodules. No worrisome pulmonary nodules to suggest pulmonary metastatic disease. There is a stable slightly lobulated 6 mm nodule in the right upper lobe just above the minor fissure. Upper Abdomen: No significant upper abdominal findings. Stable vascular calcifications. No findings suspicious for upper abdominal metastatic disease. Musculoskeletal: No significant bony findings. IMPRESSION: 1. Stable radiation fibrosis involving the left infrahilar lung. No findings suspicious for recurrent tumor. 2. Stable emphysematous changes and pulmonary scarring. 3. Stable 6 mm right upper lobe pulmonary nodule. 4. No findings suspicious for pulmonary metastatic disease. 5.  Stable advanced atherosclerotic calcifications involving the thoracic and abdominal aorta and branch vessels including the coronary arteries. 6. Emphysema and aortic atherosclerosis. Aortic Atherosclerosis (ICD10-I70.0) and Emphysema (ICD10-J43.9). Electronically Signed   By: Marijo Sanes M.D.   On: 10/31/2020 21:12    Impression:  Stage IB(T2a, N0, M0) squamous cell carcinoma of the left lower lung.   No evidence of recurrence on clinical exam today. Recent chest CT scan was stable. There were no specific findings identified to suggest local tumor recurrence or metastatic disease.    Plan: Routine follow-up with radiation oncology in six months. Chest CT scan will be performed prior to that visit.  Total time spent in this encounter was 20 minutes which included reviewing the patient's most recent chest CT scan,  physical examination, documentation, and ordering of future chest CT scan.  ____________________________________  Blair Promise, PhD, MD  This document serves as a record of services personally performed by Gery Pray, MD. It was created on his behalf by Clerance Lav, a trained medical scribe. The creation of this record is based on the scribe's personal observations and the provider's statements to them. This document has been checked and approved by  the attending provider.

## 2020-11-02 ENCOUNTER — Ambulatory Visit (INDEPENDENT_AMBULATORY_CARE_PROVIDER_SITE_OTHER): Payer: Medicare Other

## 2020-11-02 ENCOUNTER — Ambulatory Visit
Admission: RE | Admit: 2020-11-02 | Discharge: 2020-11-02 | Disposition: A | Discharge status: Home / Self Care | Payer: Medicare Other | Source: Ambulatory Visit | Attending: Radiation Oncology | Admitting: Radiation Oncology

## 2020-11-02 ENCOUNTER — Encounter: Payer: Self-pay | Admitting: Radiation Oncology

## 2020-11-02 ENCOUNTER — Other Ambulatory Visit: Payer: Self-pay

## 2020-11-02 VITALS — BP 111/63 | HR 82 | Temp 97.2°F | Resp 18 | Ht 70.5 in | Wt 169.4 lb

## 2020-11-02 DIAGNOSIS — Z85118 Personal history of other malignant neoplasm of bronchus and lung: Secondary | ICD-10-CM | POA: Diagnosis not present

## 2020-11-02 DIAGNOSIS — I700 Atherosclerosis of aorta: Secondary | ICD-10-CM | POA: Insufficient documentation

## 2020-11-02 DIAGNOSIS — Z923 Personal history of irradiation: Secondary | ICD-10-CM | POA: Insufficient documentation

## 2020-11-02 DIAGNOSIS — Z95810 Presence of automatic (implantable) cardiac defibrillator: Secondary | ICD-10-CM | POA: Diagnosis not present

## 2020-11-02 DIAGNOSIS — Z79899 Other long term (current) drug therapy: Secondary | ICD-10-CM | POA: Diagnosis not present

## 2020-11-02 DIAGNOSIS — Z794 Long term (current) use of insulin: Secondary | ICD-10-CM | POA: Insufficient documentation

## 2020-11-02 DIAGNOSIS — I5022 Chronic systolic (congestive) heart failure: Secondary | ICD-10-CM

## 2020-11-02 DIAGNOSIS — Z08 Encounter for follow-up examination after completed treatment for malignant neoplasm: Secondary | ICD-10-CM | POA: Diagnosis not present

## 2020-11-02 DIAGNOSIS — J439 Emphysema, unspecified: Secondary | ICD-10-CM | POA: Insufficient documentation

## 2020-11-02 DIAGNOSIS — C3432 Malignant neoplasm of lower lobe, left bronchus or lung: Secondary | ICD-10-CM

## 2020-11-02 NOTE — Progress Notes (Signed)
Patient is here today for follow up to radiation to left lung that was completed December 13, 2018.  Patient denies having any pain.  Reports some shortness of breath depending on activity.  Denies cough.  Denies swallowing issues.  Reports moderate appetite.  Reports energy level is back to baseline.   Vitals:   11/02/20 1022  BP: 111/63  Pulse: 82  Resp: 18  Temp: (!) 97.2 F (36.2 C)  TempSrc: Temporal  SpO2: 100%  Weight: 169 lb 6 oz (76.8 kg)  Height: 5' 10.5" (1.791 m)

## 2020-11-03 NOTE — Progress Notes (Signed)
EPIC Encounter for ICM Monitoring  Patient Name: [name] is a 70 y.o. male Date: 11/03/2020 Primary Care Physican: [name], MD Primary Cardiologist:Turner Electrophysiologist:Allred 2/15/2022Weight:160lbs    Spoke with wife and patient is doing well.  He was outside mowing.    CorVue thoracic impedance suggestingnormal fluid levels.   Prescribed:  Furosemide20 mg take 1 tablet daily. Taking differently: Taking Furosemide PRN instead of daily  Labs: 02/07/2020 Creatinine1.10, BUN16, Potassium4.2, Sodium139, [id number] 09/06/2019 Creatinine1.31, BUN23, Potassium4.4, Sodium136, [id number] A complete set of results can be found in Results Review.  Recommendations:No changes and encouraged to call if experiencing any fluid symptoms.  Follow-up plan: ICM clinic phone appointment on4/25/2022. 91 day device clinic remote transmission 11/11/2020.   EP/Cardiology Office Visits: Recall for 11/15/2020 with Oda Kilts, [place] and 05/14/2021 with Dr Rayann Heman.  Recall 08/28/2021 with Dr Radford Pax.  Copy of ICM check sent to Dr.Allred.  3 month ICM trend: 11/02/2020.    1 Year ICM trend:       [name], RN 11/03/2020 12:54 PM

## 2020-11-09 DIAGNOSIS — E039 Hypothyroidism, unspecified: Secondary | ICD-10-CM | POA: Diagnosis not present

## 2020-11-09 DIAGNOSIS — E7800 Pure hypercholesterolemia, unspecified: Secondary | ICD-10-CM | POA: Diagnosis not present

## 2020-11-09 DIAGNOSIS — E1165 Type 2 diabetes mellitus with hyperglycemia: Secondary | ICD-10-CM | POA: Diagnosis not present

## 2020-11-09 DIAGNOSIS — N529 Male erectile dysfunction, unspecified: Secondary | ICD-10-CM | POA: Diagnosis not present

## 2020-11-09 DIAGNOSIS — I10 Essential (primary) hypertension: Secondary | ICD-10-CM | POA: Diagnosis not present

## 2020-11-09 DIAGNOSIS — N189 Chronic kidney disease, unspecified: Secondary | ICD-10-CM | POA: Diagnosis not present

## 2020-11-09 DIAGNOSIS — C3490 Malignant neoplasm of unspecified part of unspecified bronchus or lung: Secondary | ICD-10-CM | POA: Diagnosis not present

## 2020-11-11 ENCOUNTER — Ambulatory Visit (INDEPENDENT_AMBULATORY_CARE_PROVIDER_SITE_OTHER): Payer: Medicare Other

## 2020-11-11 DIAGNOSIS — I420 Dilated cardiomyopathy: Secondary | ICD-10-CM | POA: Diagnosis not present

## 2020-11-11 LAB — CUP PACEART REMOTE DEVICE CHECK
Battery Remaining Longevity: 95 mo
Battery Remaining Percentage: 90 %
Battery Voltage: 3.1 V
Brady Statistic AP VP Percent: 0 %
Brady Statistic AP VS Percent: 1 %
Brady Statistic AS VP Percent: 1 %
Brady Statistic AS VS Percent: 99 %
Brady Statistic RA Percent Paced: 1 %
Brady Statistic RV Percent Paced: 1 %
Date Time Interrogation Session: 20220330020016
HighPow Impedance: 57 Ohm
HighPow Impedance: 57 Ohm
Implantable Lead Implant Date: 20210629
Implantable Lead Implant Date: 20210629
Implantable Lead Location: 753859
Implantable Lead Location: 753860
Implantable Pulse Generator Implant Date: 20210629
Lead Channel Impedance Value: 440 Ohm
Lead Channel Impedance Value: 480 Ohm
Lead Channel Pacing Threshold Amplitude: 0.5 V
Lead Channel Pacing Threshold Amplitude: 0.75 V
Lead Channel Pacing Threshold Pulse Width: 0.5 ms
Lead Channel Pacing Threshold Pulse Width: 0.5 ms
Lead Channel Sensing Intrinsic Amplitude: 1.6 mV
Lead Channel Sensing Intrinsic Amplitude: 12 mV
Lead Channel Setting Pacing Amplitude: 2 V
Lead Channel Setting Pacing Amplitude: 2.5 V
Lead Channel Setting Pacing Pulse Width: 0.5 ms
Lead Channel Setting Sensing Sensitivity: 0.5 mV
Pulse Gen Serial Number: 9932570

## 2020-11-24 NOTE — Progress Notes (Signed)
Remote ICD transmission.   

## 2020-12-07 ENCOUNTER — Ambulatory Visit (INDEPENDENT_AMBULATORY_CARE_PROVIDER_SITE_OTHER): Payer: Medicare Other

## 2020-12-07 DIAGNOSIS — I5022 Chronic systolic (congestive) heart failure: Secondary | ICD-10-CM

## 2020-12-07 DIAGNOSIS — Z95810 Presence of automatic (implantable) cardiac defibrillator: Secondary | ICD-10-CM | POA: Diagnosis not present

## 2020-12-08 NOTE — Progress Notes (Signed)
EPIC Encounter for ICM Monitoring  Patient Name: [name] is a 70 y.o. male Date: 12/08/2020 Primary Care Physican: [name], MD Primary Cardiologist:Turner Electrophysiologist:Allred 4/26/2022Weight:160lbs    Spoke with wife and patient is doing well.  He was outside mowing.    CorVue thoracic impedance suggestingnormal fluid levels.  Prescribed:  Furosemide20 mg take 1 tablet daily. Taking differently: Taking Furosemide PRN instead of daily  Labs: 02/07/2020 Creatinine1.10, BUN16, Potassium4.2, Sodium139, VOH60-67 09/06/2019 Creatinine1.31, BUN23, Potassium4.4, Sodium136, [id number] A complete set of results can be found in Results Review.  Recommendations:No changes and encouraged to call if experiencing any fluid symptoms.  Follow-up plan: ICM clinic phone appointment on6/01/2021. 91 day device clinic remote transmission 02/10/2021.   EP/Cardiology Office Visits: Recall for 11/15/2020 with Oda Kilts, [place] and 05/14/2021 with Dr Rayann Heman. Recall 08/28/2021 with Dr Radford Pax.  Copy of ICM check sent to Dr.Allred.   3 month ICM trend: 12/07/2020.    1 Year ICM trend:       [name], RN 12/08/2020 12:44 PM

## 2020-12-15 ENCOUNTER — Other Ambulatory Visit: Payer: Self-pay | Admitting: Cardiology

## 2020-12-23 DIAGNOSIS — E1165 Type 2 diabetes mellitus with hyperglycemia: Secondary | ICD-10-CM | POA: Diagnosis not present

## 2021-01-06 ENCOUNTER — Encounter: Payer: Self-pay | Admitting: Student

## 2021-01-06 ENCOUNTER — Other Ambulatory Visit: Payer: Self-pay

## 2021-01-06 ENCOUNTER — Ambulatory Visit (INDEPENDENT_AMBULATORY_CARE_PROVIDER_SITE_OTHER): Payer: Medicare Other | Admitting: Student

## 2021-01-06 VITALS — BP 104/64 | HR 88 | Ht 70.5 in | Wt 162.4 lb

## 2021-01-06 DIAGNOSIS — I447 Left bundle-branch block, unspecified: Secondary | ICD-10-CM

## 2021-01-06 DIAGNOSIS — I10 Essential (primary) hypertension: Secondary | ICD-10-CM | POA: Diagnosis not present

## 2021-01-06 DIAGNOSIS — I420 Dilated cardiomyopathy: Secondary | ICD-10-CM | POA: Diagnosis not present

## 2021-01-06 DIAGNOSIS — Z95810 Presence of automatic (implantable) cardiac defibrillator: Secondary | ICD-10-CM | POA: Diagnosis not present

## 2021-01-06 DIAGNOSIS — I5022 Chronic systolic (congestive) heart failure: Secondary | ICD-10-CM

## 2021-01-06 DIAGNOSIS — I2510 Atherosclerotic heart disease of native coronary artery without angina pectoris: Secondary | ICD-10-CM

## 2021-01-06 LAB — CUP PACEART INCLINIC DEVICE CHECK
Battery Remaining Longevity: 97 mo
Brady Statistic RA Percent Paced: 0 %
Brady Statistic RV Percent Paced: 0 %
Date Time Interrogation Session: 20220525090311
HighPow Impedance: 60.75 Ohm
Implantable Lead Implant Date: 20210629
Implantable Lead Implant Date: 20210629
Implantable Lead Location: 753859
Implantable Lead Location: 753860
Implantable Pulse Generator Implant Date: 20210629
Lead Channel Impedance Value: 462.5 Ohm
Lead Channel Impedance Value: 512.5 Ohm
Lead Channel Pacing Threshold Amplitude: 0.5 V
Lead Channel Pacing Threshold Amplitude: 0.75 V
Lead Channel Pacing Threshold Pulse Width: 0.5 ms
Lead Channel Pacing Threshold Pulse Width: 0.5 ms
Lead Channel Sensing Intrinsic Amplitude: 1.6 mV
Lead Channel Sensing Intrinsic Amplitude: 12 mV
Lead Channel Setting Pacing Amplitude: 2 V
Lead Channel Setting Pacing Amplitude: 2.5 V
Lead Channel Setting Pacing Pulse Width: 0.5 ms
Lead Channel Setting Sensing Sensitivity: 0.5 mV
Pulse Gen Serial Number: 9932570

## 2021-01-06 LAB — CBC
Hematocrit: 44.8 % (ref 37.5–51.0)
Hemoglobin: 14.4 g/dL (ref 13.0–17.7)
MCH: 25 pg — ABNORMAL LOW (ref 26.6–33.0)
MCHC: 32.1 g/dL (ref 31.5–35.7)
MCV: 78 fL — ABNORMAL LOW (ref 79–97)
Platelets: 219 10*3/uL (ref 150–450)
RBC: 5.77 x10E6/uL (ref 4.14–5.80)
RDW: 15.5 % — ABNORMAL HIGH (ref 11.6–15.4)
WBC: 4.9 10*3/uL (ref 3.4–10.8)

## 2021-01-06 LAB — BASIC METABOLIC PANEL
BUN/Creatinine Ratio: 15 (ref 10–24)
BUN: 18 mg/dL (ref 8–27)
CO2: 23 mmol/L (ref 20–29)
Calcium: 9.2 mg/dL (ref 8.6–10.2)
Chloride: 98 mmol/L (ref 96–106)
Creatinine, Ser: 1.24 mg/dL (ref 0.76–1.27)
Glucose: 258 mg/dL — ABNORMAL HIGH (ref 65–99)
Potassium: 4.4 mmol/L (ref 3.5–5.2)
Sodium: 138 mmol/L (ref 134–144)
eGFR: 62 mL/min/{1.73_m2} (ref >59)

## 2021-01-06 NOTE — Progress Notes (Signed)
Electrophysiology Office Note Date: 01/06/2021  ID:  [name], DOB July 19, 1951, MRN [id number]  PCP: [name], MD Primary Cardiologist: Fransico Him, MD Electrophysiologist: Thompson Grayer, MD   CC: Routine ICD follow-up  [name] is a 70 y.o. male seen today for Thompson Grayer, MD for routine electrophysiology followup.  Since last being seen in our clinic the patient reports doing very well. He has SOB with only moderate or more exertion. He is able to do all of his ADLs and walk up stairs/inclines without difficulty. He denies chest pain or ICD shock. He has occasional lightheadedness with rapid standing but this is not marked or limiting. Takes medications without missing or issues.   Device History: St. Jude Dual Chamber ICD implanted 02/11/2020 for ICM and LBBB. CS lead unable to be placed due to poor anatomy and diaphragmatic stim along only available vein. History of appropriate therapy: No History of AAD therapy: No   Past Medical History:  Diagnosis Date  . AICD (automatic cardioverter/defibrillator) present 02/11/2020  . Anemia   . Arthritis   . Atrial tachycardia ([place])   . Chronic systolic CHF (congestive heart failure) ([place]) 07/04/2018  . CKD (chronic kidney disease), stage III ([place])   . Coronary artery disease 02/2012   a. s/p stenting in 2013  b. s/p CABGx2V (SVG--> PDA, SVG--> LCx).  . Diabetes mellitus    neuropathy  insulin dependent  . Dilated aortic root ([place])   . Dyslipidemia   . Erectile dysfunction   . GERD (gastroesophageal reflux disease)   . History of CVA (cerebrovascular accident)   . History of kidney stones   . History of radiation therapy 12/03/2018-12/13/2018   left lung  Dr Gery Pray  . Hx of radiation therapy to mediastinum 1985  . Hypertension   . 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 OSA (obstructive sleep apnea) 07/04/2018   Severe obstructive sleep apnea with an AHI of 30/h and mild central sleep apnea at 13.7/h  with oxygen desaturations as low as 79%.  Intolerant to PAP therapy  . S/P TAVR (transcatheter aortic valve replacement) 05/15/2018   29 mm Edwards Sapien 3 transcatheter heart valve placed via percutaneous right transfemoral approach   . Severe aortic stenosis    Past Surgical History:  Procedure Laterality Date  . BIV ICD INSERTION CRT-D N/A 02/11/2020   Procedure: BIV ICD INSERTION CRT-D;  Surgeon: Thompson Grayer, MD;  Location: [place] [facility];  Service: Cardiovascular;  Laterality: N/A;  . COLONOSCOPY  07/25/2012   Procedure: COLONOSCOPY;  Surgeon: Winfield Cunas., MD;  Location: [facility] ENDOSCOPY;  Service: Endoscopy;  Laterality: N/A;  . COLONOSCOPY N/A 12/02/2013   Procedure: COLONOSCOPY;  Surgeon: Winfield Cunas., MD;  Location: [facility] ENDOSCOPY;  Service: Endoscopy;  Laterality: N/A;  . CORONARY ARTERY BYPASS GRAFT N/A 01/04/2013   Procedure: CORONARY ARTERY BYPASS GRAFTING (CABG) times two using right saphenous vein harvested with endoscope.;  Surgeon: Ivin Poot, MD;  Location: [place];  Service: Open Heart Surgery;  Laterality: N/A;  . ESOPHAGOGASTRODUODENOSCOPY  07/20/2012   Procedure: ESOPHAGOGASTRODUODENOSCOPY (EGD);  Surgeon: Winfield Cunas., MD;  Location: [facility] ENDOSCOPY;  Service: Endoscopy;  Laterality: N/A;  . ESOPHAGOGASTRODUODENOSCOPY N/A 12/02/2013   Procedure: ESOPHAGOGASTRODUODENOSCOPY (EGD);  Surgeon: Winfield Cunas., MD;  Location: [facility] ENDOSCOPY;  Service: Endoscopy;  Laterality: N/A;  . HOT HEMOSTASIS N/A 12/02/2013   Procedure: HOT HEMOSTASIS (ARGON PLASMA COAGULATION/BICAP);  Surgeon: Winfield Cunas., MD;  Location: [facility] ENDOSCOPY;  Service: Endoscopy;  Laterality: N/A;  . INTRAOPERATIVE TRANSESOPHAGEAL ECHOCARDIOGRAM N/A 01/04/2013   Procedure: INTRAOPERATIVE TRANSESOPHAGEAL ECHOCARDIOGRAM;  Surgeon: Ivin Poot, MD;  Location: [place];  Service: Open Heart Surgery;  Laterality: N/A;  . INTRAOPERATIVE TRANSTHORACIC ECHOCARDIOGRAM N/A 05/15/2018   Procedure:  INTRAOPERATIVE TRANSTHORACIC ECHOCARDIOGRAM;  Surgeon: Burnell Blanks, MD;  Location: [place];  Service: Open Heart Surgery;  Laterality: N/A;  . IR THORACENTESIS ASP PLEURAL SPACE W/IMG GUIDE  10/19/2018  . LEFT HEART CATHETERIZATION WITH CORONARY ANGIOGRAM N/A 03/06/2012   Procedure: LEFT HEART CATHETERIZATION WITH CORONARY ANGIOGRAM;  Surgeon: [name], MD;  Location: [place] CATH LAB;  Service: Cardiovascular;  Laterality: N/A;  . LITHOTRIPSY    . [place]  . RADIAL ARTERY HARVEST Left 01/04/2013   Procedure: RADIAL ARTERY HARVEST;  Surgeon: Ivin Poot, MD;  Location: [place];  Service: Vascular;  Laterality: Left;  Artery not havested. Unsuitable for use.  . resection mediastinal seminonma    . RIGHT/LEFT HEART CATH AND CORONARY/GRAFT ANGIOGRAPHY N/A 11/15/2016   Procedure: Right/Left Heart Cath and Coronary/Graft Angiography;  Surgeon: Leonie Man, MD;  Location: [place] [facility];  Service: Cardiovascular;  Laterality: N/A;  . RIGHT/LEFT HEART CATH AND CORONARY/GRAFT ANGIOGRAPHY N/A 04/13/2018   Procedure: RIGHT/LEFT HEART CATH AND CORONARY/GRAFT ANGIOGRAPHY;  Surgeon: Jolaine Artist, MD;  Location: [place] [facility];  Service: Cardiovascular;  Laterality: N/A;  . TRANSCATHETER AORTIC VALVE REPLACEMENT, TRANSFEMORAL  05/15/2018  . TRANSCATHETER AORTIC VALVE REPLACEMENT, TRANSFEMORAL N/A 05/15/2018   Procedure: TRANSCATHETER AORTIC VALVE REPLACEMENT, TRANSFEMORAL;  Surgeon: Burnell Blanks, MD;  Location: [place];  Service: Open Heart Surgery;  Laterality: N/A;    Current Outpatient Medications  Medication Sig Dispense Refill  . amitriptyline (ELAVIL) 25 MG tablet Take 25 mg by mouth at bedtime.     Marland Kitchen amoxicillin (AMOXIL) 500 MG capsule TAKE 4 CAPSULES BY MOUTH 1 HOUR PRIOR TO DENTAL PROCEDURES 4 capsule 3  . B Complex-C (SUPER B COMPLEX PO) Take 1 tablet by mouth daily.    . B-D ULTRAFINE III SHORT PEN 31G X 8 MM MISC 1 each by Other route daily as  needed (GLUCOSE TESTING (INSULINE NEEDLE)).     . furosemide (LASIX) 20 MG tablet Take 1 tablet (20 mg total) by mouth daily. 90 tablet 3  . insulin detemir (LEVEMIR) 100 UNIT/ML injection Inject 10-18 Units into the skin See admin instructions. 18 units in the morning, 10 units at bedtime    . insulin lispro (HUMALOG) 100 UNIT/ML KwikPen Inject 2-3 Units into the skin See admin instructions. Per sliding scale at [employer] and [employer]    . ivabradine (CORLANOR) 5 MG TABS tablet Take 1 tablet (5 mg total) by mouth 2 (two) times daily with a meal. 60 tablet 9  . levothyroxine (SYNTHROID, LEVOTHROID) 50 MCG tablet Take 50 mcg by mouth daily before breakfast.     . losartan (COZAAR) 25 MG tablet TAKE 1 TABLET(25 MG) BY MOUTH DAILY 90 tablet 1  . metFORMIN (GLUCOPHAGE) 500 MG tablet Take 2 tablets (1,000 mg total) by mouth 2 (two) times daily with a meal.    . metoprolol succinate (TOPROL-XL) 25 MG 24 hr tablet TAKE 3 TABLETS(75 MG) BY MOUTH DAILY WITH OR IMMEDIATELY FOLLOWING A MEAL 270 tablet 3  . Multiple Vitamin (MULTIVITAMIN) tablet Take 1 tablet by mouth daily.    . Omega-3 Fatty Acids (FISH OIL PO) Take by mouth.    . ONE TOUCH ULTRA  TEST test strip 1 each by Other route as needed for other.     . simvastatin (ZOCOR) 20 MG tablet Take 20 mg by mouth every evening.     . zolpidem (AMBIEN) 5 MG tablet Take 2.5 mg by mouth at bedtime.     No current facility-administered medications for this visit.    Allergies:   Lipitor [atorvastatin] and Adhesive [tape]   Social History: Social History   Socioeconomic History  . Marital status: Married    Spouse name: Not on file  . Number of children: Not on file  . Years of education: Not on file  . Highest education level: Not on file  Occupational History  . Not on file  Tobacco Use  . Smoking status: Former Smoker    Quit date: 03/06/1985    Years since quitting: 35.8  . Smokeless tobacco: Never Used  Vaping Use  . Vaping Use: Never used   Substance and Sexual Activity  . Alcohol use: No  . Drug use: No  . Sexual activity: Not Currently  Other Topics Concern  . Not on file  Social History Narrative  . Not on file   Social Determinants of Health   Financial Resource Strain: Not on file  Food Insecurity: Not on file  Transportation Needs: Not on file  Physical Activity: Not on file  Stress: Not on file  Social Connections: Not on file  Intimate Partner Violence: Not on file    Family History: Family History  Problem Relation Age of Onset  . Diabetes Father   . Diabetes Mother   . Heart failure Mother   . Hypertension Mother   . Cancer Brother   . Diabetes Sister     Review of Systems: All other systems reviewed and are otherwise negative except as noted above.   Physical Exam: Vitals:   01/06/21 0842  BP: 104/64  Pulse: 88  SpO2: 98%  Weight: 162 lb 6.4 oz (73.7 kg)  Height: 5' 10.5" (1.791 m)     GEN- The patient is well appearing, alert and oriented x 3 today.   HEENT: normocephalic, atraumatic; sclera clear, conjunctiva pink; hearing intact; oropharynx clear; neck supple, no JVP Lymph- no cervical lymphadenopathy Lungs- Clear to ausculation bilaterally, normal work of breathing.  No wheezes, rales, rhonchi Heart- Regular rate and rhythm, no murmurs, rubs or gallops, PMI not laterally displaced GI- soft, non-tender, non-distended, bowel sounds present, no hepatosplenomegaly Extremities- no clubbing or cyanosis. No edema; DP/PT/radial pulses 2+ bilaterally MS- no significant deformity or atrophy Skin- warm and dry, no rash or lesion; ICD pocket well healed Psych- euthymic mood, full affect Neuro- strength and sensation are intact  ICD interrogation- reviewed in detail today,  See PACEART report  EKG:  EKG is not ordered today. EKG from 05/2020 reviewed which showed NSR and LBBB, chronic  Recent Labs: 02/07/2020: BUN 16; Creatinine, Ser 1.10; Hemoglobin 13.2; Platelets 189; Potassium 4.2;  Sodium 139   Wt Readings from Last 3 Encounters:  01/06/21 162 lb 6.4 oz (73.7 kg)  11/02/20 169 lb 6 oz (76.8 kg)  09/02/20 165 lb 9.6 oz (75.1 kg)     Other studies Reviewed: Additional studies/ records that were reviewed today include: Previous EP office notes.   Assessment and Plan:  1.  Chronic systolic dysfunction s/p St. Jude dual chamber ICD  euvolemic today Stable on an appropriate medical regimen Normal ICD function See Pace Art report No changes today He did not have suitable targets for CRT  with attempts 01/2020, per Dr. Rayann Heman, would consider LBBB pacing if his condition worsens. Would plan this prior to barostim consideration.   2. HTN Continue losartan and Toprol at current doses Intolerant to entresto with hypotension  3. S/p TAVR Stable by Echo 12/2019  4. DM2 Sugars have been running >200 recently Encouraged close follow up.  Current medicines are reviewed at length with the patient today.   The patient does not have concerns regarding his medicines.  The following changes were made today:  none  Labs/ tests ordered today include:  No orders of the defined types were placed in this encounter.  Disposition:   Follow up with Dr. Rayann Heman  6 months   Signed, Shirley Friar, PA-C  01/06/2021 8:46 AM  [facility] [address] [place] [address] [phone] (office) [phone] (fax)

## 2021-01-06 NOTE — Patient Instructions (Signed)
Medication Instructions:  Your physician recommends that you continue on your current medications as directed. Please refer to the Current Medication list given to you today.  *If you need a refill on your cardiac medications before your next appointment, please call your pharmacy*   Lab Work: TODAY: BMET, CBC  If you have labs (blood work) drawn today and your tests are completely normal, you will receive your results only by: Marland Kitchen MyChart Message (if you have MyChart) OR . A paper copy in the mail If you have any lab test that is abnormal or we need to change your treatment, we will call you to review the results.   Follow-Up: At [facility], you and your health needs are our priority.  As part of our continuing mission to provide you with exceptional heart care, we have created designated Provider Care Teams.  These Care Teams include your primary Cardiologist (physician) and Advanced Practice Providers (APPs -  Physician Assistants and Nurse Practitioners) who all work together to provide you with the care you need, when you need it.  Your next appointment:   6 month(s)  The format for your next appointment:   In Person  Provider:   You may see Thompson Grayer, MD or one of the following Advanced Practice Providers on your designated Care Team:    Chanetta Marshall, NP  Tommye Standard, PA-C  Legrand Como "[place]" [place], [place]

## 2021-01-18 ENCOUNTER — Ambulatory Visit (INDEPENDENT_AMBULATORY_CARE_PROVIDER_SITE_OTHER): Payer: Medicare Other

## 2021-01-18 DIAGNOSIS — Z95810 Presence of automatic (implantable) cardiac defibrillator: Secondary | ICD-10-CM | POA: Diagnosis not present

## 2021-01-18 DIAGNOSIS — I5022 Chronic systolic (congestive) heart failure: Secondary | ICD-10-CM | POA: Diagnosis not present

## 2021-01-19 ENCOUNTER — Telehealth: Payer: Self-pay

## 2021-01-19 NOTE — Progress Notes (Signed)
EPIC Encounter for ICM Monitoring  Patient Name: SKYLAR PRIEST is a 70 y.o. male Date: 01/19/2021 Primary Care Physican: [name], MD Primary Cardiologist:Turner Electrophysiologist:Allred 4/26/2022Weight:160lbs    Attempted call to patient and unable to reach. Transmission reviewed.   CorVue thoracic impedance normal but was suggesting possible fluid accumulation from 5/27-6/4.  Prescribed:  Furosemide20 mg take 1 tablet daily. Taking differently: Taking Furosemide PRN instead of daily  Labs: 02/07/2020 Creatinine1.10, BUN16, Potassium4.2, Sodium139, [id number] 09/06/2019 Creatinine1.31, BUN23, Potassium4.4, Sodium136, [id number] A complete set of results can be found in Results Review.  Recommendations:Unable to reach.    Follow-up plan: ICM clinic phone appointment on7/06/2021. 91 day device clinic remote transmission 02/10/2021.   EP/Cardiology Office Visits: Recall for 11/15/2020 with Oda Kilts, [place] and 05/14/2021 with Dr Rayann Heman. Recall 08/28/2021 with Dr Radford Pax.  Copy of ICM check sent to Dr.Allred.   3 month ICM trend: 01/18/2021.    1 Year ICM trend:       [name], RN 01/19/2021 12:42 PM

## 2021-01-19 NOTE — Telephone Encounter (Signed)
Remote ICM transmission received.  Attempted call to patient regarding ICM remote transmission and no answer or voice mail option.  

## 2021-01-26 ENCOUNTER — Other Ambulatory Visit: Payer: Self-pay | Admitting: Cardiology

## 2021-02-10 ENCOUNTER — Ambulatory Visit (INDEPENDENT_AMBULATORY_CARE_PROVIDER_SITE_OTHER): Payer: Medicare Other

## 2021-02-10 DIAGNOSIS — I420 Dilated cardiomyopathy: Secondary | ICD-10-CM

## 2021-02-10 DIAGNOSIS — I5022 Chronic systolic (congestive) heart failure: Secondary | ICD-10-CM

## 2021-02-10 LAB — CUP PACEART REMOTE DEVICE CHECK
Battery Remaining Longevity: 94 mo
Battery Remaining Percentage: 88 %
Battery Voltage: 3.07 V
Brady Statistic AP VP Percent: 0 %
Brady Statistic AP VS Percent: 1 %
Brady Statistic AS VP Percent: 0 %
Brady Statistic AS VS Percent: 99 %
Brady Statistic RA Percent Paced: 1 %
Brady Statistic RV Percent Paced: 0 %
Date Time Interrogation Session: 20220629020014
HighPow Impedance: 60 Ohm
HighPow Impedance: 60 Ohm
Implantable Lead Implant Date: 20210629
Implantable Lead Implant Date: 20210629
Implantable Lead Location: 753859
Implantable Lead Location: 753860
Implantable Pulse Generator Implant Date: 20210629
Lead Channel Impedance Value: 450 Ohm
Lead Channel Impedance Value: 510 Ohm
Lead Channel Pacing Threshold Amplitude: 0.5 V
Lead Channel Pacing Threshold Amplitude: 0.75 V
Lead Channel Pacing Threshold Pulse Width: 0.5 ms
Lead Channel Pacing Threshold Pulse Width: 0.5 ms
Lead Channel Sensing Intrinsic Amplitude: 1.6 mV
Lead Channel Sensing Intrinsic Amplitude: 12 mV
Lead Channel Setting Pacing Amplitude: 2 V
Lead Channel Setting Pacing Amplitude: 2.5 V
Lead Channel Setting Pacing Pulse Width: 0.5 ms
Lead Channel Setting Sensing Sensitivity: 0.5 mV
Pulse Gen Serial Number: 9932570

## 2021-02-20 ENCOUNTER — Other Ambulatory Visit: Payer: Self-pay | Admitting: Cardiology

## 2021-02-22 ENCOUNTER — Ambulatory Visit (INDEPENDENT_AMBULATORY_CARE_PROVIDER_SITE_OTHER): Payer: Medicare Other

## 2021-02-22 DIAGNOSIS — I5022 Chronic systolic (congestive) heart failure: Secondary | ICD-10-CM

## 2021-02-22 DIAGNOSIS — Z95810 Presence of automatic (implantable) cardiac defibrillator: Secondary | ICD-10-CM | POA: Diagnosis not present

## 2021-02-24 NOTE — Progress Notes (Signed)
EPIC Encounter for ICM Monitoring  Patient Name: [name] is a 70 y.o. male Date: 02/24/2021 Primary Care Physican: [name], MD Primary Cardiologist: Radford Pax Electrophysiologist: Allred 02/24/2021 Weight: 159 lbs                                                            Spoke with patient and heart failure questions reviewed.  Pt asymptomatic for fluid accumulation and feeing well.   CorVue thoracic impedance normal.    Prescribed: Furosemide 20 mg take 1 tablet daily.  Taking differently: Taking Furosemide PRN instead of daily   Labs: 02/07/2020 Creatinine 1.10, BUN 16, Potassium 4.2, Sodium 139, GFR 68-78 09/06/2019 Creatinine 1.31, BUN 23, Potassium 4.4, Sodium 136, GFR 55-63  A complete set of results can be found in Results Review.   Recommendations: No changes and encouraged to call if experiencing any fluid symptoms.   Follow-up plan: ICM clinic phone appointment on 03/29/2021.   91 day device clinic remote transmission 05/12/2021.     EP/Cardiology Office Visits:  Recall 05/14/2021 with Dr Rayann Heman.  Recall 08/28/2021 with Dr Radford Pax.  Advised to call office to book yearly appointment with Dr Rayann Heman.   Copy of ICM check sent to Dr. Rayann Heman.    3 month ICM trend: 02/22/2021.    1 Year ICM trend:       [name], RN 02/24/2021 12:03 PM

## 2021-02-24 NOTE — Progress Notes (Signed)
Remote ICD transmission.   

## 2021-03-15 ENCOUNTER — Ambulatory Visit (INDEPENDENT_AMBULATORY_CARE_PROVIDER_SITE_OTHER): Payer: Medicare Other

## 2021-03-15 DIAGNOSIS — I5022 Chronic systolic (congestive) heart failure: Secondary | ICD-10-CM

## 2021-03-15 DIAGNOSIS — Z95810 Presence of automatic (implantable) cardiac defibrillator: Secondary | ICD-10-CM

## 2021-03-17 ENCOUNTER — Telehealth: Payer: Self-pay

## 2021-03-17 NOTE — Progress Notes (Signed)
EPIC Encounter for ICM Monitoring  Patient Name: [name] is a 70 y.o. male Date: 03/17/2021 Primary Care Physican: [name], MD Primary Cardiologist: Radford Pax Electrophysiologist: Allred 02/24/2021 Weight: 159 lbs                                                            Spoke with patient and heart failure questions reviewed.  Pt asymptomatic for fluid accumulation and feeling well.   CorVue thoracic impedance normal.    Prescribed: Furosemide 20 mg take 1 tablet daily.  Taking differently: Taking Furosemide PRN instead of daily   Labs: 02/07/2020 Creatinine 1.10, BUN 16, Potassium 4.2, Sodium 139, GFR 68-78 09/06/2019 Creatinine 1.31, BUN 23, Potassium 4.4, Sodium 136, GFR 55-63  A complete set of results can be found in Results Review.   Recommendations: No changes and encouraged to call if experiencing any fluid symptoms.   Follow-up plan: ICM clinic phone appointment on 04/12/2021.   91 day device clinic remote transmission 05/12/2021.     EP/Cardiology Office Visits:  Recall 05/14/2021 with Dr Rayann Heman.  Recall 08/28/2021 with Dr Radford Pax.  Advised to call office to book yearly appointment with Dr Rayann Heman.   Copy of ICM check sent to Dr. Rayann Heman.    3 month ICM trend: 03/17/2021.    1 Year ICM trend:       [name], RN 03/17/2021 9:26 AM

## 2021-03-17 NOTE — Telephone Encounter (Signed)
Spoke with patient.  Assisted in sending ICM remote transmission.

## 2021-03-22 DIAGNOSIS — E1165 Type 2 diabetes mellitus with hyperglycemia: Secondary | ICD-10-CM | POA: Diagnosis not present

## 2021-03-22 DIAGNOSIS — E039 Hypothyroidism, unspecified: Secondary | ICD-10-CM | POA: Diagnosis not present

## 2021-04-07 DIAGNOSIS — E1165 Type 2 diabetes mellitus with hyperglycemia: Secondary | ICD-10-CM | POA: Diagnosis not present

## 2021-04-07 DIAGNOSIS — I10 Essential (primary) hypertension: Secondary | ICD-10-CM | POA: Diagnosis not present

## 2021-04-07 DIAGNOSIS — E039 Hypothyroidism, unspecified: Secondary | ICD-10-CM | POA: Diagnosis not present

## 2021-04-07 DIAGNOSIS — N189 Chronic kidney disease, unspecified: Secondary | ICD-10-CM | POA: Diagnosis not present

## 2021-04-07 DIAGNOSIS — E7800 Pure hypercholesterolemia, unspecified: Secondary | ICD-10-CM | POA: Diagnosis not present

## 2021-04-12 ENCOUNTER — Ambulatory Visit (INDEPENDENT_AMBULATORY_CARE_PROVIDER_SITE_OTHER): Payer: Medicare Other

## 2021-04-12 DIAGNOSIS — Z95810 Presence of automatic (implantable) cardiac defibrillator: Secondary | ICD-10-CM | POA: Diagnosis not present

## 2021-04-12 DIAGNOSIS — I5022 Chronic systolic (congestive) heart failure: Secondary | ICD-10-CM

## 2021-04-13 NOTE — Progress Notes (Signed)
EPIC Encounter for ICM Monitoring  Patient Name: [name] is a 70 y.o. male Date: 04/13/2021 Primary Care Physican: [name], MD Primary Cardiologist: Radford Pax Electrophysiologist: Allred 02/24/2021 Weight: 159 lbs 04/13/2021 Weight: 158 lbs                                                            Spoke with patient and heart failure questions reviewed.  Pt asymptomatic for fluid accumulation and feeling well. He typically takes Furosemide a couple of times a month.   CorVue thoracic impedance normal. 8/17-8/23   Prescribed: Furosemide 20 mg take 1 tablet daily.  Taking differently: Taking Furosemide PRN instead of daily   Labs: 02/07/2020 Creatinine 1.10, BUN 16, Potassium 4.2, Sodium 139, GFR 68-78 09/06/2019 Creatinine 1.31, BUN 23, Potassium 4.4, Sodium 136, GFR 55-63  A complete set of results can be found in Results Review.   Recommendations: No changes and encouraged to call if experiencing any fluid symptoms.   Follow-up plan: ICM clinic phone appointment on 05/17/2021.   91 day device clinic remote transmission 05/12/2021.     EP/Cardiology Office Visits:  Recall 05/14/2021 with Dr Rayann Heman.  Recall 08/28/2021 with Dr Radford Pax.  Advised to call office to book yearly appointment with Dr Rayann Heman.   Copy of ICM check sent to Dr. Rayann Heman.    3 month ICM trend: 04/12/2021.    1 Year ICM trend:       [name], RN 04/13/2021 3:12 PM

## 2021-04-14 ENCOUNTER — Telehealth: Payer: Self-pay | Admitting: *Deleted

## 2021-04-14 NOTE — Telephone Encounter (Signed)
CALLED PATIENT TO INFORM OF CT FOR 05-05-21- ARRIVAL TIME - 1:15 PM @ [facility], NO RESTRICTIONS TO TEST, PATIENT TO RECEIVE RESULTS FROM DR. KINARD ON 05-06-21 @ 11AM, SPOKE WITH PATIENT AND HIS WIFE AND THEY VERIFIED UNDERSTANDING THIESE APPTS.

## 2021-05-05 ENCOUNTER — Other Ambulatory Visit: Payer: Self-pay

## 2021-05-05 ENCOUNTER — Ambulatory Visit (HOSPITAL_COMMUNITY)
Admission: RE | Admit: 2021-05-05 | Discharge: 2021-05-05 | Disposition: A | Discharge status: Home / Self Care | Payer: Medicare Other | Source: Ambulatory Visit | Attending: Radiation Oncology | Admitting: Radiation Oncology

## 2021-05-05 DIAGNOSIS — J439 Emphysema, unspecified: Secondary | ICD-10-CM | POA: Diagnosis not present

## 2021-05-05 DIAGNOSIS — C3432 Malignant neoplasm of lower lobe, left bronchus or lung: Secondary | ICD-10-CM | POA: Insufficient documentation

## 2021-05-05 DIAGNOSIS — C3490 Malignant neoplasm of unspecified part of unspecified bronchus or lung: Secondary | ICD-10-CM | POA: Diagnosis not present

## 2021-05-05 DIAGNOSIS — I700 Atherosclerosis of aorta: Secondary | ICD-10-CM | POA: Diagnosis not present

## 2021-05-05 DIAGNOSIS — R911 Solitary pulmonary nodule: Secondary | ICD-10-CM | POA: Diagnosis not present

## 2021-05-05 NOTE — Progress Notes (Signed)
Radiation Oncology         [phone] ________________________________  Name: [name] MRN: [id number]  Date: 05/06/2021  DOB: 03-09-1951  Follow-Up Visit Note  CC: [name], MD  [name], MD    ICD-10-CM   1. Squamous cell carcinoma of bronchus in left lower lobe (HCC)  C34.32 CT CHEST WO CONTRAST    2. 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 Details of this treatment are not available. The patient reports having several weeks of radiation therapy. He reports his treatments were given at [facility]  Narrative:  The patient returns today for routine follow-up and to review recent CT findings, he was last here for follow-up on 11/02/20.       Chest CT performed on 05/05/21 demonstrated the unchanged post treatment/post radiation appearance of the superior segment left lower lobe and posterior lingula, with dense consolidation and fibrosis. No noncontrast CT evidence of locally recurrent or metastatic disease in the chest was otherwise seen. CT also revealed the unchanged appearance of the 0.6 cm nodule of the inferior right upper lobe.     Since his last visit, the patient has maintained close follow-up at [facility] with Dr. Radford Pax and Dr. Rayann Heman in regards to his chronic systolic dysfunction and dual chamber ICD. Per his most recent available visit note on 01/06/21 with Micheal Tillery PA-C at [facility], the patient was noted to be able to perform all of his ADL's without difficulty. He denied chest pain or ICD shock. Patient did report ongoing SOB with moderate/more exertion and occasional lightheadedness with rapid standing, but otherwise was noted to be  doing quite well.        Patient denies any pain within the chest area significant cough or hemoptysis.            Allergies:  is allergic to lipitor [atorvastatin] and adhesive [tape].  Meds: Current Outpatient Medications  Medication Sig Dispense Refill   amitriptyline (ELAVIL) 25 MG tablet Take 25 mg by mouth at bedtime.      B Complex-C (SUPER B COMPLEX PO) Take 1 tablet by mouth daily.     B-D ULTRAFINE III SHORT PEN 31G X 8 MM MISC 1 each by Other route daily as needed (GLUCOSE TESTING (INSULINE NEEDLE)).      furosemide (LASIX) 20 MG tablet TAKE 1 TABLET(20 MG) BY MOUTH DAILY 90 tablet 3   insulin detemir (LEVEMIR) 100 UNIT/ML injection Inject 10-18 Units into the skin See admin instructions. 18 units in the morning, 10 units at bedtime     insulin lispro (HUMALOG) 100 UNIT/ML KwikPen Inject 2-3 Units into the skin See admin instructions. Per sliding scale at Breakfast and Dinner     levothyroxine (SYNTHROID, LEVOTHROID) 50 MCG tablet Take 50 mcg by mouth daily before breakfast.      losartan (COZAAR) 25 MG tablet TAKE 1 TABLET(25 MG) BY MOUTH DAILY 90 tablet 3   metFORMIN (GLUCOPHAGE) 500 MG tablet Take 2 tablets (1,000 mg total) by mouth 2 (two) times daily with a meal.     metoprolol succinate (TOPROL-XL) 25 MG 24 hr tablet TAKE  3 TABLETS(75 MG) BY MOUTH DAILY WITH OR IMMEDIATELY FOLLOWING A MEAL 270 tablet 3   Multiple Vitamin (MULTIVITAMIN) tablet Take 1 tablet by mouth daily.     Omega-3 Fatty Acids (FISH OIL PO) Take by mouth.     ONE TOUCH ULTRA TEST test strip 1 each by Other route as needed for other.      simvastatin (ZOCOR) 20 MG tablet Take 20 mg by mouth every evening.      zolpidem (AMBIEN) 5 MG tablet Take 2.5 mg by mouth at bedtime.     amoxicillin (AMOXIL) 500 MG capsule TAKE 4 CAPSULES BY MOUTH 1 HOUR PRIOR TO DENTAL PROCEDURES (Patient not taking: Reported on 05/06/2021) 4 capsule 3   ivabradine (CORLANOR) 5 MG TABS tablet Take 1 tablet (5 mg total) by mouth 2 (two)  times daily with a meal. (Patient not taking: Reported on 05/06/2021) 60 tablet 9   No current facility-administered medications for this encounter.    Physical Findings: The patient is in no acute distress. Patient is alert and oriented.  height is 5\' 11"  (1.803 m) and weight is 159 lb 12.8 oz (72.5 kg). His temperature is 98.1 F (36.7 C). His blood pressure is 128/89 and his pulse is 92. His respiration is 20 and oxygen saturation is 100%. .  No significant changes. Lungs are clear to auscultation bilaterally. Heart has regular rate and rhythm. No palpable cervical, supraclavicular, or axillary adenopathy. Abdomen soft, non-tender, normal bowel sounds.    Lab Findings: Lab Results  Component Value Date   WBC 4.9 01/06/2021   HGB 14.4 01/06/2021   HCT 44.8 01/06/2021   MCV 78 (L) 01/06/2021   PLT 219 01/06/2021    Radiographic Findings: CT CHEST WO CONTRAST  Result Date: 05/06/2021 CLINICAL DATA:  Non-small cell lung cancer, assess treatment response, status post radiation therapy EXAM: CT CHEST WITHOUT CONTRAST TECHNIQUE: Multidetector CT imaging of the chest was performed following the standard protocol without IV contrast. COMPARISON:  10/30/2020 FINDINGS: Cardiovascular: Aortic atherosclerosis. Aortic valve stent endograft repair. Left chest multi lead pacer defibrillator. Cardiomegaly with extensive 3 vessel coronary artery calcifications and stents. No pericardial effusion. Mediastinum/Nodes: No enlarged mediastinal, hilar, or axillary lymph nodes. Thyroid gland, trachea, and esophagus demonstrate no significant findings. Lungs/Pleura: Minimal centrilobular and paraseptal emphysema. Unchanged post treatment/post radiation appearance of the superior segment left lower lobe and posterior lingula, with dense consolidation and fibrosis. Unchanged 0.6 cm nodule of the inferior right upper lobe (series 5, image 74). No pleural effusion or pneumothorax. Upper Abdomen: No acute abnormality.  Musculoskeletal: No chest wall mass or suspicious bone lesions identified. IMPRESSION: 1. Unchanged post treatment/post radiation appearance of the superior segment left lower lobe and posterior lingula, with dense consolidation and fibrosis. No noncontrast CT evidence of locally recurrent or metastatic disease in the chest. 2. Unchanged 0.6 cm nodule of the inferior right upper lobe. Continued attention on follow-up. 3. Coronary artery disease. Aortic Atherosclerosis (ICD10-I70.0) and Emphysema (ICD10-J43.9). Electronically Signed   By: Eddie Candle M.D.   On: 05/06/2021 08:59    Impression:  Stage IB (T2a, N0, M0) squamous cell carcinoma of the left lower lung  No evidence of recurrence on clinical exam today and recent chest CT scan.  Clinically the patient seems to be doing well and is stable.  Plan: The patient will follow-up in approximately 6 months.  Prior to this follow-up exam he will undergo a chest CT scan.   20 minutes of total time was spent for this  patient encounter, including preparation, face-to-face counseling with the patient and coordination of care, physical exam, and documentation of the encounter and ordering of upcoming exams. ____________________________________  Blair Promise, PhD, MD   This document serves as a record of services personally performed by Gery Pray, MD. It was created on his behalf by Roney Mans, a trained medical scribe. The creation of this record is based on the scribe's personal observations and the provider's statements to them. This document has been checked and approved by the attending provider.

## 2021-05-06 ENCOUNTER — Ambulatory Visit
Admission: RE | Admit: 2021-05-06 | Discharge: 2021-05-06 | Disposition: A | Discharge status: Home / Self Care | Payer: Medicare Other | Source: Ambulatory Visit | Attending: Radiation Oncology | Admitting: Radiation Oncology

## 2021-05-06 ENCOUNTER — Encounter: Payer: Self-pay | Admitting: Radiation Oncology

## 2021-05-06 VITALS — BP 128/89 | HR 92 | Temp 98.1°F | Resp 20 | Ht 71.0 in | Wt 159.8 lb

## 2021-05-06 DIAGNOSIS — J432 Centrilobular emphysema: Secondary | ICD-10-CM | POA: Diagnosis not present

## 2021-05-06 DIAGNOSIS — R42 Dizziness and giddiness: Secondary | ICD-10-CM | POA: Insufficient documentation

## 2021-05-06 DIAGNOSIS — I517 Cardiomegaly: Secondary | ICD-10-CM | POA: Diagnosis not present

## 2021-05-06 DIAGNOSIS — Z8547 Personal history of malignant neoplasm of testis: Secondary | ICD-10-CM | POA: Diagnosis not present

## 2021-05-06 DIAGNOSIS — I2510 Atherosclerotic heart disease of native coronary artery without angina pectoris: Secondary | ICD-10-CM | POA: Insufficient documentation

## 2021-05-06 DIAGNOSIS — R911 Solitary pulmonary nodule: Secondary | ICD-10-CM | POA: Diagnosis not present

## 2021-05-06 DIAGNOSIS — Z923 Personal history of irradiation: Secondary | ICD-10-CM | POA: Insufficient documentation

## 2021-05-06 DIAGNOSIS — I700 Atherosclerosis of aorta: Secondary | ICD-10-CM | POA: Diagnosis not present

## 2021-05-06 DIAGNOSIS — Z794 Long term (current) use of insulin: Secondary | ICD-10-CM | POA: Diagnosis not present

## 2021-05-06 DIAGNOSIS — Z79899 Other long term (current) drug therapy: Secondary | ICD-10-CM | POA: Diagnosis not present

## 2021-05-06 DIAGNOSIS — Z85118 Personal history of other malignant neoplasm of bronchus and lung: Secondary | ICD-10-CM | POA: Insufficient documentation

## 2021-05-06 DIAGNOSIS — C3432 Malignant neoplasm of lower lobe, left bronchus or lung: Secondary | ICD-10-CM

## 2021-05-06 DIAGNOSIS — Z08 Encounter for follow-up examination after completed treatment for malignant neoplasm: Secondary | ICD-10-CM | POA: Diagnosis not present

## 2021-05-06 NOTE — Progress Notes (Signed)
[name] is here today for follow up post radiation to the lung.  Lung Side: left  Does the patient complain of any of the following: Pain:No Shortness of breath w/wo exertion: Yes, shortness of breath on exertion.  Cough: Reports having a occasional dry cough.  Hemoptysis: no Pain with swallowing: no Swallowing/choking concerns: no Appetite: Good Energy [name]: Patient reports having a good energy [name].  Post radiation skin Changes: Skin is intact.      Additional comments if applicable:   Vitals:   05/06/21 1049  BP: 128/89  Pulse: 92  Resp: 20  Temp: 98.1 F (36.7 C)  SpO2: 100%  Weight: 159 lb 12.8 oz (72.5 kg)  Height: 5\' 11"  (1.803 m)

## 2021-05-12 ENCOUNTER — Ambulatory Visit (INDEPENDENT_AMBULATORY_CARE_PROVIDER_SITE_OTHER): Payer: Medicare Other

## 2021-05-12 DIAGNOSIS — I420 Dilated cardiomyopathy: Secondary | ICD-10-CM | POA: Diagnosis not present

## 2021-05-13 LAB — CUP PACEART REMOTE DEVICE CHECK
Battery Remaining Longevity: 91 mo
Battery Remaining Percentage: 85 %
Battery Voltage: 3.04 V
Brady Statistic AP VP Percent: 0 %
Brady Statistic AP VS Percent: 1 %
Brady Statistic AS VP Percent: 1 %
Brady Statistic AS VS Percent: 99 %
Brady Statistic RA Percent Paced: 1 %
Brady Statistic RV Percent Paced: 1 %
Date Time Interrogation Session: 20220928020018
HighPow Impedance: 57 Ohm
HighPow Impedance: 57 Ohm
Implantable Lead Implant Date: 20210629
Implantable Lead Implant Date: 20210629
Implantable Lead Location: 753859
Implantable Lead Location: 753860
Implantable Pulse Generator Implant Date: 20210629
Lead Channel Impedance Value: 440 Ohm
Lead Channel Impedance Value: 450 Ohm
Lead Channel Pacing Threshold Amplitude: 0.5 V
Lead Channel Pacing Threshold Amplitude: 0.75 V
Lead Channel Pacing Threshold Pulse Width: 0.5 ms
Lead Channel Pacing Threshold Pulse Width: 0.5 ms
Lead Channel Sensing Intrinsic Amplitude: 1.3 mV
Lead Channel Sensing Intrinsic Amplitude: 12 mV
Lead Channel Setting Pacing Amplitude: 2 V
Lead Channel Setting Pacing Amplitude: 2.5 V
Lead Channel Setting Pacing Pulse Width: 0.5 ms
Lead Channel Setting Sensing Sensitivity: 0.5 mV
Pulse Gen Serial Number: 9932570

## 2021-05-17 ENCOUNTER — Telehealth: Payer: Self-pay

## 2021-05-17 ENCOUNTER — Ambulatory Visit (INDEPENDENT_AMBULATORY_CARE_PROVIDER_SITE_OTHER): Payer: Medicare Other

## 2021-05-17 DIAGNOSIS — I5022 Chronic systolic (congestive) heart failure: Secondary | ICD-10-CM

## 2021-05-17 DIAGNOSIS — Z95810 Presence of automatic (implantable) cardiac defibrillator: Secondary | ICD-10-CM | POA: Diagnosis not present

## 2021-05-17 DIAGNOSIS — Z23 Encounter for immunization: Secondary | ICD-10-CM | POA: Diagnosis not present

## 2021-05-17 DIAGNOSIS — G4700 Insomnia, unspecified: Secondary | ICD-10-CM | POA: Diagnosis not present

## 2021-05-17 DIAGNOSIS — E039 Hypothyroidism, unspecified: Secondary | ICD-10-CM | POA: Diagnosis not present

## 2021-05-17 DIAGNOSIS — E1122 Type 2 diabetes mellitus with diabetic chronic kidney disease: Secondary | ICD-10-CM | POA: Diagnosis not present

## 2021-05-17 DIAGNOSIS — I2510 Atherosclerotic heart disease of native coronary artery without angina pectoris: Secondary | ICD-10-CM | POA: Diagnosis not present

## 2021-05-17 DIAGNOSIS — I10 Essential (primary) hypertension: Secondary | ICD-10-CM | POA: Diagnosis not present

## 2021-05-17 NOTE — Telephone Encounter (Signed)
Remote ICM transmission received.  Attempted call to patient regarding ICM remote transmission and no answer or voice mail option.  

## 2021-05-17 NOTE — Progress Notes (Signed)
EPIC Encounter for ICM Monitoring  Patient Name: [name] is a 70 y.o. male Date: 05/17/2021 Primary Care Physican: [name], MD Primary Cardiologist: Radford Pax Electrophysiologist: Allred 04/13/2021 Weight: 158 lbs                                                            Attempted call to patient and unable to reach. Transmission reviewed.    CorVue thoracic impedance suggesting possible fluid accumulation starting 05/13/2021.    Prescribed: Furosemide 20 mg take 1 tablet daily.  Taking differently: Taking Furosemide PRN instead of daily   Labs: 01/06/2021 Creatinine 1.24, BUN 18, Potassium 4.4, Sodium 138, GFR 62 A complete set of results can be found in Results Review.   Recommendations:  Unable to reach.     Follow-up plan: ICM clinic phone appointment on 05/25/2021 to recheck fluid levels.   91 day device clinic remote transmission 08/11/2021.     EP/Cardiology Office Visits:  Recall 05/14/2021 with Dr Rayann Heman.  Recall 08/28/2021 with Dr Radford Pax.     Copy of ICM check sent to Dr. Rayann Heman.     3 month ICM trend: 05/17/2021.    1 Year ICM trend:       [name], RN 05/17/2021 3:30 PM

## 2021-05-19 NOTE — Progress Notes (Signed)
Remote ICD transmission.   

## 2021-05-25 ENCOUNTER — Ambulatory Visit (INDEPENDENT_AMBULATORY_CARE_PROVIDER_SITE_OTHER): Payer: Medicare Other

## 2021-05-25 DIAGNOSIS — Z95810 Presence of automatic (implantable) cardiac defibrillator: Secondary | ICD-10-CM

## 2021-05-25 DIAGNOSIS — I5022 Chronic systolic (congestive) heart failure: Secondary | ICD-10-CM

## 2021-05-26 NOTE — Progress Notes (Signed)
EPIC Encounter for ICM Monitoring  Patient Name: [name] is a 70 y.o. male Date: 05/26/2021 Primary Care Physican: [name], MD Primary Cardiologist: Radford Pax Electrophysiologist: Allred 05/26/2021 Weight: 151 lbs                                                            Spoke with patient and heart failure questions reviewed.  Pt asymptomatic for fluid accumulation and feeling well.  He has returned to work in the last 2 weeks which requires a lot of walking.    CorVue thoracic impedance suggesting fluid levels returned to normal.    Prescribed: Furosemide 20 mg take 1 tablet daily.  Taking differently: Taking Furosemide PRN instead of daily   Labs: 01/06/2021 Creatinine 1.24, BUN 18, Potassium 4.4, Sodium 138, GFR 62 A complete set of results can be found in Results Review.   Recommendations:  No changes and encouraged to call if experiencing any fluid symptoms.   Follow-up plan: ICM clinic phone appointment on 06/21/2021.   91 day device clinic remote transmission 08/11/2021.     EP/Cardiology Office Visits:  Recall 05/14/2021 with Dr Rayann Heman.  Recall 08/28/2021 with Dr Radford Pax.     Copy of ICM check sent to Dr. Rayann Heman.     3 month ICM trend: 05/25/2021.    1 Year ICM trend:       [name], RN 05/26/2021 12:31 PM

## 2021-06-07 DIAGNOSIS — E1122 Type 2 diabetes mellitus with diabetic chronic kidney disease: Secondary | ICD-10-CM | POA: Diagnosis not present

## 2021-06-07 DIAGNOSIS — E7800 Pure hypercholesterolemia, unspecified: Secondary | ICD-10-CM | POA: Diagnosis not present

## 2021-06-07 DIAGNOSIS — G4700 Insomnia, unspecified: Secondary | ICD-10-CM | POA: Diagnosis not present

## 2021-06-07 DIAGNOSIS — N1831 Chronic kidney disease, stage 3a: Secondary | ICD-10-CM | POA: Diagnosis not present

## 2021-06-07 DIAGNOSIS — E039 Hypothyroidism, unspecified: Secondary | ICD-10-CM | POA: Diagnosis not present

## 2021-06-07 DIAGNOSIS — D500 Iron deficiency anemia secondary to blood loss (chronic): Secondary | ICD-10-CM | POA: Diagnosis not present

## 2021-06-07 DIAGNOSIS — I2510 Atherosclerotic heart disease of native coronary artery without angina pectoris: Secondary | ICD-10-CM | POA: Diagnosis not present

## 2021-06-07 DIAGNOSIS — E785 Hyperlipidemia, unspecified: Secondary | ICD-10-CM | POA: Diagnosis not present

## 2021-06-07 DIAGNOSIS — I10 Essential (primary) hypertension: Secondary | ICD-10-CM | POA: Diagnosis not present

## 2021-06-08 ENCOUNTER — Telehealth: Payer: Self-pay

## 2021-06-08 NOTE — Telephone Encounter (Signed)
**Note De-Identified [name] Obfuscation** [employer] pt asst application for [place] was faxed to [place] from [name] with [employer] with a request to complete the provider page and to fax back to her at [phone].  I have completed the providers page of the application and have emailed all to Dr Landis Gandy nurse so she can obtain her signature, date it, and to fax back to [name]: [name] at the fax number written on the cover letter included or to place in the to be faxed basket in Medical Records to be faxed.

## 2021-06-09 NOTE — Telephone Encounter (Signed)
Application has been signed and faxed to [facility]. Confirmation received.

## 2021-06-21 ENCOUNTER — Telehealth: Payer: Self-pay

## 2021-06-21 ENCOUNTER — Ambulatory Visit (INDEPENDENT_AMBULATORY_CARE_PROVIDER_SITE_OTHER): Payer: Medicare Other

## 2021-06-21 DIAGNOSIS — Z95810 Presence of automatic (implantable) cardiac defibrillator: Secondary | ICD-10-CM | POA: Diagnosis not present

## 2021-06-21 DIAGNOSIS — I5022 Chronic systolic (congestive) heart failure: Secondary | ICD-10-CM | POA: Diagnosis not present

## 2021-06-21 NOTE — Telephone Encounter (Signed)
Remote ICM transmission received.  Attempted call to patient regarding ICM remote transmission and no answer or voice mail option.  

## 2021-06-21 NOTE — Progress Notes (Signed)
EPIC Encounter for ICM Monitoring  Patient Name: [name] is a 70 y.o. male Date: 06/21/2021 Primary Care Physican: [name], MD Primary Cardiologist: Radford Pax Electrophysiologist: Allred 05/26/2021 Weight: 151 lbs                                                            Attempted call to patient and unable to reach.   Transmission reviewed.    CorVue thoracic impedance suggesting possible fluid accumulation starting 11/3.  Impedance also suggesting possible fluid accumulation from 10/12-10/29.   Prescribed: Furosemide 20 mg take 1 tablet daily.  Taking differently: Taking Furosemide PRN instead of daily   Labs: 01/06/2021 Creatinine 1.24, BUN 18, Potassium 4.4, Sodium 138, GFR 62 A complete set of results can be found in Results Review.   Recommendations:  Unable to reach.  Will advise patient to take Furosemide daily x 3 days if he is taking PRN.   Follow-up plan: ICM clinic phone appointment on 06/29/2021 to recheck fluid levels.   91 day device clinic remote transmission 08/11/2021.     EP/Cardiology Office Visits:  Recall 05/14/2021 with Dr Rayann Heman.  Recall 08/28/2021 with Dr Radford Pax.     Copy of ICM check sent to Dr. Rayann Heman.    Will send to Dr Radford Pax for review if patient is reached.   3 month ICM trend: 06/21/2021.    1 Year ICM trend:       [name], RN 06/21/2021 10:39 AM

## 2021-06-29 ENCOUNTER — Ambulatory Visit (INDEPENDENT_AMBULATORY_CARE_PROVIDER_SITE_OTHER): Payer: Medicare Other

## 2021-06-29 DIAGNOSIS — Z95810 Presence of automatic (implantable) cardiac defibrillator: Secondary | ICD-10-CM

## 2021-06-29 DIAGNOSIS — I5022 Chronic systolic (congestive) heart failure: Secondary | ICD-10-CM

## 2021-06-30 NOTE — Progress Notes (Signed)
EPIC Encounter for ICM Monitoring  Patient Name: [name] is a 70 y.o. male Date: 06/30/2021 Primary Care Physican: [name], MD Primary Cardiologist: Radford Pax Electrophysiologist: Allred 05/26/2021 Weight: 151 lbs                                                            Transmission reviewed.    CorVue thoracic impedance suggesting fluid levels returned to normal.   Prescribed: Furosemide 20 mg take 1 tablet daily.  Taking differently: Taking Furosemide PRN instead of daily   Labs: 01/06/2021 Creatinine 1.24, BUN 18, Potassium 4.4, Sodium 138, GFR 62 A complete set of results can be found in Results Review.   Recommendations:  No changes.   Follow-up plan: ICM clinic phone appointment on 07/26/2021.   91 day device clinic remote transmission 08/11/2021.     EP/Cardiology Office Visits:  Recall 05/14/2021 with Dr Rayann Heman.  Recall 08/28/2021 with Dr Radford Pax.     Copy of ICM check sent to Dr. Rayann Heman.     3 month ICM trend: 06/29/2021.    12-14 Month ICM trend:       [name], RN 06/30/2021 8:28 AM

## 2021-07-05 ENCOUNTER — Telehealth: Payer: Self-pay

## 2021-07-05 NOTE — Telephone Encounter (Signed)
**Note De-Identified Monay Houlton Obfuscation** [employer] Pt Asst application received from [name] with [employer] fax requesting that we complete page 4 of the application, have Dr Radford Pax sign/date it, and to fax back to [place] at [phone].  I have completed page 4 (providers page) and have emailed it to Dr Landis Gandy nurse so she can obtain her signature, date it and to fax to [place] at the fax number written on the cover letter included or to place in the to be faxed basket in Medical Records to be faxed.

## 2021-07-06 DIAGNOSIS — N1831 Chronic kidney disease, stage 3a: Secondary | ICD-10-CM | POA: Diagnosis not present

## 2021-07-06 DIAGNOSIS — G4700 Insomnia, unspecified: Secondary | ICD-10-CM | POA: Diagnosis not present

## 2021-07-06 DIAGNOSIS — I10 Essential (primary) hypertension: Secondary | ICD-10-CM | POA: Diagnosis not present

## 2021-07-06 DIAGNOSIS — E7800 Pure hypercholesterolemia, unspecified: Secondary | ICD-10-CM | POA: Diagnosis not present

## 2021-07-06 DIAGNOSIS — E1122 Type 2 diabetes mellitus with diabetic chronic kidney disease: Secondary | ICD-10-CM | POA: Diagnosis not present

## 2021-07-06 DIAGNOSIS — I2510 Atherosclerotic heart disease of native coronary artery without angina pectoris: Secondary | ICD-10-CM | POA: Diagnosis not present

## 2021-07-06 DIAGNOSIS — E785 Hyperlipidemia, unspecified: Secondary | ICD-10-CM | POA: Diagnosis not present

## 2021-07-06 DIAGNOSIS — D500 Iron deficiency anemia secondary to blood loss (chronic): Secondary | ICD-10-CM | POA: Diagnosis not present

## 2021-07-06 DIAGNOSIS — E039 Hypothyroidism, unspecified: Secondary | ICD-10-CM | POA: Diagnosis not present

## 2021-07-12 NOTE — Telephone Encounter (Signed)
Form has been signed and placed in medical records to be faxed.

## 2021-07-18 ENCOUNTER — Other Ambulatory Visit: Payer: Self-pay | Admitting: Cardiology

## 2021-07-23 ENCOUNTER — Telehealth: Payer: Self-pay | Admitting: Cardiology

## 2021-07-23 NOTE — Telephone Encounter (Signed)
STAT if patient feels like he/she is going to faint   Are you dizzy now? no  Do you feel faint or have you passed out? no  Do you have any other symptoms? SOB  Have you checked your HR and BP (record if available)? No  Pt c/o Shortness Of Breath: STAT if SOB developed within the last 24 hours or pt is noticeably SOB on the phone  1. Are you currently SOB (can you hear that pt is SOB on the phone)? no  2. How long have you been experiencing SOB? A couple weeks   3. Are you SOB when sitting or when up moving around? Unpredictable. Can happen all of a sudden without warning   4. Are you currently experiencing any other symptoms? No

## 2021-07-23 NOTE — Telephone Encounter (Signed)
Attempted to reach patient to assist with sending manual transmission.  No answer/ no VM.  Sending mychart message with instructions for sending manual transmission.

## 2021-07-23 NOTE — Telephone Encounter (Signed)
Spoke with the patient who reports that he has been having increased shortness of breath over the past couple of weeks. He states that it comes on suddenly and not always with exertion. He states that this occurs 4-5 times per day.  He states that he does have some swelling in his ankles. He has not been elevating his legs which I have advised him to start doing. Have also advised him to limit the salt in his diet,  Patient states that he has gained 2-3 lbs over the past couple of weeks. He has been taking an extra 20 mg of Lasix several days per week without any improvement.  He reports that sometimes at work he feels like he is going to pass out. He denies feeling like this currently. Also denies any dizziness or lightheadedness. He states that he just gets so short of breath that he feels like he may pass out at work. Denies feeling like this currently. I have scheduled him for an appointment with Sande Rives, PA-C for 12/12. Encouraged patient to report to ER for any worsening symptoms. Patient agrees. Patient has an ICD - will have device clinic check.

## 2021-07-25 NOTE — Progress Notes (Addendum)
Office Visit    Patient Name: [name] Date of Encounter: 07/26/2021  PCP:  [name], [place]  Cardiologist:  Fransico Him, MD  Advanced Practice Provider:  No care team member to display Electrophysiologist:  Thompson Grayer, MD   Chief Complaint    [name] is a 70 y.o. male with a hx of CAD, aortic stenosis, and systolic heart failure presents today for annual follow-up appointment.  Past Medical History    Past Medical History:  Diagnosis Date   AICD (automatic cardioverter/defibrillator) present 02/11/2020   Anemia    Arthritis    Atrial tachycardia (HCC)    Chronic systolic CHF (congestive heart failure) ([place]) 07/04/2018   CKD (chronic kidney disease), stage III ([place])    Coronary artery disease 02/2012   a. s/p stenting in 2013  b. s/p CABGx2V (SVG--> PDA, SVG--> LCx).   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  Intolerant to PAP therapy   S/P TAVR (transcatheter aortic valve replacement) 05/15/2018   29 mm Edwards Sapien 3 transcatheter heart valve placed via percutaneous right transfemoral approach    Severe aortic stenosis    Past Surgical History:  Procedure Laterality Date   BIV ICD INSERTION CRT-D N/A 02/11/2020   Procedure: BIV ICD INSERTION CRT-D;  Surgeon: Thompson Grayer, MD;  Location: [place] [facility] LAB;  Service: Cardiovascular;  Laterality: N/A;    COLONOSCOPY  07/25/2012   Procedure: COLONOSCOPY;  Surgeon: Winfield Cunas., MD;  Location: [facility] ENDOSCOPY;  Service: Endoscopy;  Laterality: N/A;   COLONOSCOPY N/A 12/02/2013   Procedure: COLONOSCOPY;  Surgeon: Winfield Cunas., MD;  Location: [facility] ENDOSCOPY;  Service: Endoscopy;  Laterality: N/A;   CORONARY ARTERY BYPASS GRAFT N/A 01/04/2013   Procedure: CORONARY ARTERY BYPASS GRAFTING (CABG) times two using right saphenous vein harvested with endoscope.;  Surgeon: Ivin Poot, MD;  Location: [place];  Service: Open Heart Surgery;  Laterality: N/A;   ESOPHAGOGASTRODUODENOSCOPY  07/20/2012   Procedure: ESOPHAGOGASTRODUODENOSCOPY (EGD);  Surgeon: Winfield Cunas., MD;  Location: [facility] ENDOSCOPY;  Service: Endoscopy;  Laterality: N/A;   ESOPHAGOGASTRODUODENOSCOPY N/A 12/02/2013   Procedure: ESOPHAGOGASTRODUODENOSCOPY (EGD);  Surgeon: Winfield Cunas., MD;  Location: [facility] ENDOS[facility];  Service: Endoscopy;  Laterality: N/A;   HOT HEMOSTASIS N/A 12/02/2013   Procedure: HOT HEMOSTASIS (ARGON PLASMA COAGULATION/BICAP);  Surgeon: Winfield Cunas., MD;  Location: [facility] ENDOSCOPY;  Service: Endoscopy;  Laterality: N/A;   INTRAOPERATIVE TRANSESOPHAGEAL ECHOCARDIOGRAM N/A 01/04/2013   Procedure: INTRAOPERATIVE TRANSESOPHAGEAL ECHOCARDIOGRAM;  Surgeon: Ivin Poot, MD;  Location: [place];  Service: Open Heart Surgery;  Laterality: N/A;   INTRAOPERATIVE TRANSTHORACIC ECHOCARDIOGRAM N/A 05/15/2018   Procedure: INTRAOPERATIVE TRANSTHORACIC ECHOCARDIOGRAM;  Surgeon: Burnell Blanks, MD;  Location: [place];  Service: Open Heart Surgery;  Laterality: N/A;   IR THORACENTESIS ASP  PLEURAL SPACE W/IMG GUIDE  10/19/2018   LEFT HEART CATHETERIZATION WITH CORONARY ANGIOGRAM N/A 03/06/2012   Procedure: LEFT HEART CATHETERIZATION WITH CORONARY ANGIOGRAM;  Surgeon: [name], MD;  Location: [place] CATH LAB;  Service: Cardiovascular;  Laterality: N/A;   LITHOTRIPSY     [place] Left  01/04/2013   Procedure: RADIAL ARTERY HARVEST;  Surgeon: Ivin Poot, MD;  Location: [place];  Service: Vascular;  Laterality: Left;  Artery not havested. Unsuitable for use.   resection mediastinal seminonma     RIGHT/LEFT HEART CATH AND CORONARY/GRAFT ANGIOGRAPHY N/A 11/15/2016   Procedure: Right/Left Heart Cath and Coronary/Graft Angiography;  Surgeon: Leonie Man, MD;  Location: [place] CV LAB;  Service: Cardiovascular;  Laterality: N/A;   RIGHT/LEFT HEART CATH AND CORONARY/GRAFT ANGIOGRAPHY N/A 04/13/2018   Procedure: RIGHT/LEFT HEART CATH AND CORONARY/GRAFT ANGIOGRAPHY;  Surgeon: Jolaine Artist, MD;  Location: [place] CV LAB;  Service: Cardiovascular;  Laterality: N/A;   TRANSCATHETER AORTIC VALVE REPLACEMENT, TRANSFEMORAL  05/15/2018   TRANSCATHETER AORTIC VALVE REPLACEMENT, TRANSFEMORAL N/A 05/15/2018   Procedure: TRANSCATHETER AORTIC VALVE REPLACEMENT, TRANSFEMORAL;  Surgeon: Burnell Blanks, MD;  Location: [place];  Service: Open Heart Surgery;  Laterality: N/A;    Allergies  Allergies  Allergen Reactions   Lipitor [Atorvastatin] Other (See Comments)    Muscle pain   Adhesive [Tape] Rash    History of Present Illness    [name] is a 70 y.o. male with a hx of hx of CAD, aortic stenosis, and systolic heart failure presents today for annual follow-up appointment last seen by Dr. Radford Pax January 2022.  He had a non-STEMI back in 2013 treated with PCI of the left circumflex, OM.  LHC in 5/14 demonstrated severe two-vessel CAD with critical in-stent stenosis in the left circumflex.  He underwent CABG (SVG-PDA, SVG-L Cx).  He also had a history of aortic stenosis.  Echocardiogram 10/2016 showed moderately reduced LVEF at 35 to 40% with diffuse hypokinesis, mild AS and moderate AR.  Right and left heart catheterization showed mild to moderate AAS with AVA 0.837 cm and EF 45% with patent SVG to RPDA and SVG to OM1 with occlusion of his native vessels.  He ultimately  was diagnosed with low-grade aortic stenosis with ARI and underwent TAVR with a 29 mm Edwards SAPIEN 3 TVH via transfemoral approach after being admitted with acute exacerbation of heart failure on 05/15/2018  His baseline weight in the past has been around 162 pounds but over the past few months he has been running around 154.  He has not been tolerating Entresto due to hypotension.  He has been on a low-dose beta-blocker.  He only takes Lasix as needed for weight greater than 165 and only has been taking recently about twice a week.  He saw Dr. Rayann Heman after repeat 2D echocardiogram showed persistently reduced LVEF with EF 20 to 25%.  Due to active lung cancer he was found not to be candidate for ICD but Dr. Rayann Heman felt he would be a candidate for CRT-D the patient did not want to pursue at that time.  He was also found to have SVT which was unclear whether sinus in origin or atrial tach.  He was already on metoprolol which was increased to 75 mg daily.  2D echocardiogram and Lexiscan Myoview ordered but canceled on follow-up study as heart rate was still not under control and we wanted her heart rate to be better controlled  first.  A D-dimer was elevated and the chest CTA was negative for PE but showed left lower lobe atelectasis versus PNA with early interstitial edema.  He was started on Lasix 40 mg twice daily for 3 days.  He was seen back in our office by Sharrell Ku, PA-C and EKG showed persistent of SVT.  He was started on Corlanor.  It was felt that possibly his CHF is exacerbated by his tachycardia.  His weight was down 5 pounds at that office visit and his Lasix was decreased to 20 mg daily and less than 2 g of sodium diet with 2 L of fluid restriction.  He ultimately underwent BiV ICD implantation on 01/2020 by Dr. Rayann Heman for persistent LV dysfunction with EF less than 25.  At his last appointment he denied any pressure in his chest, chest pain, SOB, DOE, PND, orthopnea, LE edema, palpitations or  syncope.  He had noticed some dizziness from time to time which she describes as losing focus for few seconds and resolved.  He is compliant with all his medications and tolerating all his meds without issue.  Today he is most concerned with his shortness of breath.  He states that he gets short of breath with minimal activity and also sometimes gets short of breath while sitting still.  He feels like it is hard to catch his breath at times.  He has been having trouble sleeping at night and has been sleeping in a recliner.  He cannot sleep flat or on his side.  He has had no dizziness or lightheadedness.  He does not take his blood pressure at home and he is encouraged to do so daily.  He does notice at times that his heart beat is a little bit faster but then returns to normal.  He does have a history of SVT and is currently on Corlanor.  He did not understand why he was prescribed this medication and actually had stopped taking it for a few months.  I explained that this medication is to slow his heart rate down to decrease the episodes of SVT.  It also will help with his chronic CHF symptoms.  He has been working with Legrand Como over at [facility] one of our pharmacists to try to get this medication for a better price. He also was taken off his ASA by Dr. Radford Pax per the patient which may have been a miscommunication. He was on DAPT post TAVR and now is not taking anything. When he comes back in a week we will plan to restart this, he does not have any contraindications to taking ASA 81mg  daily.  He does admit to eating more salt recently due to eating out.  He is recently separated and has been eating out more than usual.  We discussed sticking to a low-sodium diet and continuing to monitor his weights daily.  He did have a thoracentesis for a pleural effusion done over a year ago.  He denies any chest pain or chest pressure.  He does have 1-2+ pitting lower extremity edema as well as JVD today on exam. It does  sound like he has orthopnea and PND.    EKGs/Labs/Other Studies Reviewed:   The following studies were reviewed today:  Echocardiogram 12/2019  IMPRESSIONS     1. Left ventricular ejection fraction, by estimation, is <20%. The left  ventricle has severely decreased function. The left ventricle demonstrates  global hypokinesis.The average left ventricular global longitudinal strain  is abnormal at -10.7 %.  2. Right ventricular systolic function is mildly reduced. The right  ventricular size is mildly enlarged. There is moderately elevated  pulmonary artery systolic pressure. The estimated right ventricular  systolic pressure is 56.8 mmHg.   3. The mitral valve is normal in structure. Mild to moderate mitral valve  regurgitation. No evidence of mitral stenosis.   4. The aortic valve has been repaired/replaced. Aortic valve  regurgitation is not visualized. No aortic stenosis is present. There is a  29 mm Edwards Sapien prosthetic (TAVR) valve present in the aortic  position. Procedure Date: 05/15/18. Aortic valve  area, by VTI measures 3.17 cm. Aortic valve mean gradient measures 6.0  mmHg. Aortic valve Vmax measures 1.64 m/s.   5. The inferior vena cava is dilated in size with <50% respiratory  variability, suggesting right atrial pressure of 15 mmHg.   6. Left atrial size was moderately dilated.   Comparison(s): 06/05/19 Ef 30%. AV 74mmHg mean PG. 41mmHg peak PG.   FINDINGS   Left Ventricle: Left ventricular ejection fraction, by estimation, is  <20%. The left ventricle has severely decreased function. The left  ventricle demonstrates global hypokinesis. The average left ventricular  global longitudinal strain is -10.7 %. The  left ventricular internal cavity size was normal in size. There is no left  ventricular hypertrophy. Left ventricular diastolic function could not be  evaluated.   Right Ventricle: The right ventricular size is mildly enlarged. No  increase in right  ventricular wall thickness. Right ventricular systolic  function is mildly reduced. There is moderately elevated pulmonary artery  systolic pressure. The tricuspid  regurgitant velocity is 3.26 m/s, and with an assumed right atrial  pressure of 15 mmHg, the estimated right ventricular systolic pressure is  12.7 mmHg.   Left Atrium: Left atrial size was moderately dilated.   Right Atrium: Right atrial size was normal in size.   Pericardium: There is no evidence of pericardial effusion.   Mitral Valve: The mitral valve is normal in structure. There is moderate  thickening of the mitral valve leaflet(s). Normal mobility of the mitral  valve leaflets. Moderate mitral annular calcification. Mild to moderate  mitral valve regurgitation. No  evidence of mitral valve stenosis.   Tricuspid Valve: The tricuspid valve is normal in structure. Tricuspid  valve regurgitation is trivial. No evidence of tricuspid stenosis.   Aortic Valve: The aortic valve has been repaired/replaced. Aortic valve  regurgitation is not visualized. No aortic stenosis is present. Aortic  valve mean gradient measures 6.0 mmHg. Aortic valve peak gradient measures  10.8 mmHg. Aortic valve area, by VTI   measures 3.17 cm. There is a 29 mm Edwards Sapien prosthetic, stented  (TAVR) valve present in the aortic position. Procedure Date: 05/15/18.   Pulmonic Valve: The pulmonic valve was normal in structure. Pulmonic valve  regurgitation is not visualized. No evidence of pulmonic stenosis.   Aorta: The aortic root is normal in size and structure.   Venous: The inferior vena cava is dilated in size with less than 50%  respiratory variability, suggesting right atrial pressure of 15 mmHg.   IAS/Shunts: No atrial level shunt detected by color flow Doppler.    Cardiac catheterization 03/2018  Ost 1st Diag to 1st Diag lesion is 50% stenosed. Prox LAD to Mid LAD lesion is 40% stenosed. Ost RCA lesion is 100% stenosed. SVG  and is normal in caliber and large. SVG and is large. Ost Cx to Prox Cx lesion is 100% stenosed. Ost LAD to Prox LAD lesion  is 40% stenosed.   Findings:   Ao =109/68 (86) LV = 117/29 RA = 19 RV = 54/17 PA = 62/21 (42) PCW = 40 Fick cardiac output/index = 3.5/1.78 PVR = 0.6 WU FA sat = 96% PA sat = 58%, 61% PAPi = 2.2 RA/PCWP = 0.475 CPO = 0.67 Av gradient: peak 15.0 mmHG  Mean 14.56mmHG  AVA = 0.89 cm2   Assessment: 1. Severe 2v CAD with total occlusion of ostial RCA and LCx 2. Moderate non-obstructive CAD in LAD 3. EF 30% by echo (no v-gram done due to high LVEDP) 4. Severe low gradient AS with reduced cardiac output and markedly elevated filling pressures   Plan/Discussion:   1) Continue diuresis - I increased lasix to 80 IVT tid. Cut lopressor to 50 daily with low output 2) Continue with TAVR w/up. D/w Drs. Ellyn Hack and [place]  EKG:  EKG is  ordered today.  The ekg ordered today demonstrates LBBB    Recent Labs: 01/06/2021: BUN 18; Creatinine, Ser 1.24; Hemoglobin 14.4; Platelets 219; Potassium 4.4; Sodium 138  Recent Lipid Panel    Component Value Date/Time   CHOL 127 06/13/2019 0846   TRIG 47 06/13/2019 0846   HDL 42 06/13/2019 0846   CHOLHDL 3.0 06/13/2019 0846   CHOLHDL 2.9 04/12/2018 0216   VLDL 7 04/12/2018 0216   LDLCALC 74 06/13/2019 0846      Home Medications   Current Meds  Medication Sig   amitriptyline (ELAVIL) 25 MG tablet Take 25 mg by mouth at bedtime.    amoxicillin (AMOXIL) 500 MG capsule TAKE 4 CAPSULES BY MOUTH 1 HOUR PRIOR TO DENTAL PROCEDURES   B Complex-C (SUPER B COMPLEX PO) Take 1 tablet by mouth daily.   B-D ULTRAFINE III SHORT PEN 31G X 8 MM MISC 1 each by Other route daily as needed (GLUCOSE TESTING (INSULINE NEEDLE)).    furosemide (LASIX) 20 MG tablet TAKE 1 TABLET(20 MG) BY MOUTH DAILY   insulin detemir (LEVEMIR) 100 UNIT/ML injection Inject 10-18 Units into the skin See admin instructions. 18 units in the morning, 10 units at  bedtime   insulin lispro (HUMALOG) 100 UNIT/ML KwikPen Inject 2-3 Units into the skin See admin instructions. Per sliding scale at Breakfast and Dinner   ivabradine (CORLANOR) 5 MG TABS tablet Take 1 tablet (5 mg total) by mouth 2 (two) times daily. Please keep upcoming appointment for future refills. Thank you   levothyroxine (SYNTHROID, LEVOTHROID) 50 MCG tablet Take 50 mcg by mouth daily before breakfast.    losartan (COZAAR) 25 MG tablet TAKE 1 TABLET(25 MG) BY MOUTH DAILY   metFORMIN (GLUCOPHAGE) 500 MG tablet Take 2 tablets (1,000 mg total) by mouth 2 (two) times daily with a meal.   metoprolol succinate (TOPROL-XL) 25 MG 24 hr tablet TAKE 3 TABLETS(75 MG) BY MOUTH DAILY WITH OR IMMEDIATELY FOLLOWING A MEAL   Multiple Vitamin (MULTIVITAMIN) tablet Take 1 tablet by mouth daily.   Omega-3 Fatty Acids (FISH OIL PO) Take by mouth.   ONE TOUCH ULTRA TEST test strip 1 each by Other route as needed for other.    simvastatin (ZOCOR) 20 MG tablet Take 20 mg by mouth every evening.    zolpidem (AMBIEN) 5 MG tablet Take 2.5 mg by mouth at bedtime.     Review of Systems      All other systems reviewed and are otherwise negative except as noted above.  Physical Exam    VS:  BP 112/66   Pulse 95  Ht 5\' 11"  (1.803 m)   Wt 165 lb 6.4 oz (75 kg)   SpO2 97%   BMI 23.07 kg/m  , BMI Body mass index is 23.07 kg/m.  Wt Readings from Last 3 Encounters:  07/26/21 165 lb 6.4 oz (75 kg)  05/06/21 159 lb 12.8 oz (72.5 kg)  01/06/21 162 lb 6.4 oz (73.7 kg)     GEN: Well nourished, well developed, in no acute distress. HEENT: normal. Neck: Supple, + JVD,  no carotid bruits, or masses. Cardiac: RRR, no murmurs, rubs, or gallops. 1-2+ pitting bilateral lower ext edema.  Radials/PT 2+ and equal bilaterally.  Respiratory:  Respirations regular and unlabored, clear to auscultation bilaterally, slightly diminished in bilateral lower lobes GI: Soft, nontender, nondistended. MS: No deformity or  atrophy. Skin: Warm and dry, no rash. Neuro:  Strength and sensation are intact. Psych: Normal affect.  Assessment & Plan    Chronic systolic CHF -Baseline weight between 154 and 157 pounds. He weighs 165 lbs today. He has not been keeping track of his weights at home. -His echocardiogram in 01/2019 showed EF 20 to 25% and echocardiogram 01/2020 showed LVEF less than 20% -Status post BiV AICD June 2021 -He does appear fluid overloaded on exam today. He has 1-2+ pitting bilateral lower extremity edema, positive JVD, positive shortness of breath with minimal activity -Continue GDMT:  Lasix, Corlanor, and Toprol-XL -Entresto discontinued due to hypotension -Increase lasix to 40mg  daily for one week, BMET today and at f/u appointment. We did not get a BNP since it would not change our plan.   CAD s/p CABG -Status post non-STEMI in 2013 treated with PCI of the left circumflex/OM -Left heart cath in 5/14 demonstrated severe two-vessel CAD with critical in-stent restenosis in the circumflex -He underwent CABG (SVG-PDA, SVG-LCx) -Repeat left heart cath showed patent SVG to RPDA, SVG to OM1, 50% ostial D1, 40% proximal to mid LAD, occluded ostial RCA, occluded ostial left circumflex, 40% ostial LAD -No current chest pain -Continue beta-blocker and statin -Will add ASA 81mg  since not currently on and no contraindications at next visit  Hypertension -BP well controlled today -Continue to take your blood pressure at home -Continue low-sodium diet -Continue current medications Toprol-XL 75 mg daily   Severe low-grade low output AS -Status post TAVR 10/19 with 29 mm Edwards SAPIEN 3 THV via transfemoral approach -2D echo 06/05/2019 showed a stable aortic valve TAVR with trivial perivalvular AR.  Mean aortic valve gradient 8 mmHg -restart ASA 81mg   Hyperlipidemia goal LDL less than 70 -LDL back in December 2021 was elevated at 115 -He is currently on Zocor 20 mg daily -Recent lipid panel and  LFTs next week with repeat BMP  Tachycardia/ Hx SVT -Maintaining normal sinus rhythm on exam -Continue Corlanor 5 mg twice daily and Toprol XL 75 mg daily -TSH normal -NSR in the 90s with chronic RBBB  Disposition: Follow up in 1 week(s) with Fransico Him, MD or APP.  Signed, Elgie Collard, PA-C 07/26/2021, 12:05 PM [place]

## 2021-07-26 ENCOUNTER — Ambulatory Visit (INDEPENDENT_AMBULATORY_CARE_PROVIDER_SITE_OTHER): Payer: Medicare Other

## 2021-07-26 ENCOUNTER — Ambulatory Visit (INDEPENDENT_AMBULATORY_CARE_PROVIDER_SITE_OTHER): Payer: Medicare Other | Admitting: Physician Assistant

## 2021-07-26 ENCOUNTER — Other Ambulatory Visit: Payer: Self-pay

## 2021-07-26 ENCOUNTER — Encounter: Payer: Self-pay | Admitting: Student

## 2021-07-26 VITALS — BP 112/66 | HR 95 | Ht 71.0 in | Wt 165.4 lb

## 2021-07-26 DIAGNOSIS — Z95810 Presence of automatic (implantable) cardiac defibrillator: Secondary | ICD-10-CM | POA: Diagnosis not present

## 2021-07-26 DIAGNOSIS — I2510 Atherosclerotic heart disease of native coronary artery without angina pectoris: Secondary | ICD-10-CM

## 2021-07-26 DIAGNOSIS — E785 Hyperlipidemia, unspecified: Secondary | ICD-10-CM | POA: Diagnosis not present

## 2021-07-26 DIAGNOSIS — I471 Supraventricular tachycardia, unspecified: Secondary | ICD-10-CM

## 2021-07-26 DIAGNOSIS — Z79899 Other long term (current) drug therapy: Secondary | ICD-10-CM

## 2021-07-26 DIAGNOSIS — I5022 Chronic systolic (congestive) heart failure: Secondary | ICD-10-CM

## 2021-07-26 DIAGNOSIS — I350 Nonrheumatic aortic (valve) stenosis: Secondary | ICD-10-CM | POA: Diagnosis not present

## 2021-07-26 DIAGNOSIS — R000 Tachycardia, unspecified: Secondary | ICD-10-CM

## 2021-07-26 DIAGNOSIS — I10 Essential (primary) hypertension: Secondary | ICD-10-CM | POA: Diagnosis not present

## 2021-07-26 NOTE — Progress Notes (Signed)
EPIC Encounter for ICM Monitoring  Patient Name: [name] is a 70 y.o. male Date: 07/26/2021 Primary Care Physican: [name], MD Primary Cardiologist: Radford Pax Electrophysiologist: [place] 05/26/2021 Weight: 151 lbs 07/26/2021 164 lbs                                                             Spoke with patient and heart failure questions reviewed.  Pt reporting fluid symptoms of 4-5 lb weight gain and lower leg swelling which has been evaluated by Nicholes Rough PA today.    Advised by PA today to take Furosemide 40 mg daily x 1 week.  Pt has been feeling bad x 2 weeks but did not contact office regarding symptoms.    Prior to today's visit he was taking Furosemide 2 x a week instead of PRN and had stopped taking Corlanor due to cost but has resumed as of yesterday.  Advised not to adjust or stop medications without discussing with physician.   CorVue thoracic impedance suggesting possible fluid accumulation since 11/18.   Prescribed: Furosemide 20 mg take 1 tablet daily.     Labs: 01/06/2021 Creatinine 1.24, BUN 18, Potassium 4.4, Sodium 138, GFR 62 A complete set of results can be found in Results Review.   Recommendations:  Advised to follow directions given at today's OV with Nicholes Rough PA to take Furosemide 40 mg daily x 1 week.  He will return for follow up OV on 12/20 at 9:40 AM.    Follow-up plan: ICM clinic phone appointment on 08/03/2021 to recheck fluid levels.   91 day device clinic remote transmission 08/11/2021.     EP/Cardiology Office Visits:  Recall 05/14/2021 with Dr Rayann Heman.  Recall 08/28/2021 with Dr Radford Pax.     Copy of ICM check sent to Dr. Rayann Heman.   Copy sent Nicholes Rough PA as [name].    3 month ICM trend: 07/26/2021.    12-14 Month ICM trend:       Bill Billings, RN 07/26/2021 1:18 PM

## 2021-07-26 NOTE — Telephone Encounter (Signed)
Scheduled 91 day transmission came through this morning at 4am,  Presenting rate ASVS 101bpm so not in AF but borderline tachy.  Otherwise normal device function.  Generally his heart rate is pretty fast based on histograms.     His Corvue level is way down which indicates possible fluid overload.     Presenting Rate

## 2021-07-26 NOTE — Patient Instructions (Addendum)
Medication Instructions:  INCREASE Lasix to 40 mg (2- 20 mg tablets) daily for 1 week  *If you need a refill on your cardiac medications before your next appointment, please call your pharmacy*  Lab Work: Your physician recommends that you return for lab work TODAY:  BMET   Your physician recommends that you return for lab work 1 week:  BMET Fasting Lipid Panel-DO NOT EAT OR DRINK PAST MIDNIGHT. Hepatic (Liver) Function Test If you have labs (blood work) drawn today and your tests are completely normal, you will receive your results only by: MyChart Message (if you have MyChart) OR A paper copy in the mail If you have any lab test that is abnormal or we need to change your treatment, we will call you to review the results.  Testing/Procedures: NONE ordered at this time of appointment   Follow-Up: At [facility], you and your health needs are our priority.  As part of our continuing mission to provide you with exceptional heart care, we have created designated Provider Care Teams.  These Care Teams include your primary Cardiologist (physician) and Advanced Practice Providers (APPs -  Physician Assistants and Nurse Practitioners) who all work together to provide you with the care you need, when you need it.  Your next appointment:   1-2 week(s)  The format for your next appointment:   In Person  Provider:   Sande Rives, PA-C or other APP    {    Other Instructions Monitor blood pressure at home daily 2 hours after morning medications. Bring log with you to follow up appointment. Weigh self in the mornings after voiding and before breakfast

## 2021-07-27 LAB — BASIC METABOLIC PANEL
BUN/Creatinine Ratio: 15 (ref 10–24)
BUN: 18 mg/dL (ref 8–27)
CO2: 23 mmol/L (ref 20–29)
Calcium: 9.3 mg/dL (ref 8.6–10.2)
Chloride: 104 mmol/L (ref 96–106)
Creatinine, Ser: 1.21 mg/dL (ref 0.76–1.27)
Glucose: 209 mg/dL — ABNORMAL HIGH (ref 70–99)
Potassium: 4.3 mmol/L (ref 3.5–5.2)
Sodium: 144 mmol/L (ref 134–144)
eGFR: 64 mL/min/{1.73_m2} (ref >59)

## 2021-08-02 NOTE — Progress Notes (Signed)
Office Visit    Patient Name: [name] Date of Encounter: 08/03/2021  PCP:  [name], [place]  Cardiologist:  Fransico Him, MD  Advanced Practice Provider:  No care team member to display Electrophysiologist:  Thompson Grayer, MD   Chief Complaint    [name] is a 70 y.o. male with a hx of CAD, aortic stenosis, and systolic heart failure presents today for annual follow-up appointment.  Past Medical History    Past Medical History:  Diagnosis Date   AICD (automatic cardioverter/defibrillator) present 02/11/2020   Anemia    Arthritis    Atrial tachycardia (HCC)    Chronic systolic CHF (congestive heart failure) ([place]) 07/04/2018   CKD (chronic kidney disease), stage III ([place])    Coronary artery disease 02/2012   a. s/p stenting in 2013  b. s/p CABGx2V (SVG--> PDA, SVG--> LCx).   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  Intolerant to PAP therapy   S/P TAVR (transcatheter aortic valve replacement) 05/15/2018   29 mm Edwards Sapien 3 transcatheter heart valve placed via percutaneous right transfemoral approach    Severe aortic stenosis    Past Surgical History:  Procedure Laterality Date   BIV ICD INSERTION CRT-D N/A 02/11/2020   Procedure: BIV ICD INSERTION CRT-D;  Surgeon: Thompson Grayer, MD;  Location: [facility];  Service: Cardiovascular;  Laterality: N/A;    COLONOSCOPY  07/25/2012   Procedure: COLONOSCOPY;  Surgeon: Winfield Cunas., MD;  Location: [facility]OSCOPY;  Service: Endoscopy;  Laterality: N/A;   COLONOSCOPY N/A 12/02/2013   Procedure: COLONOSCOPY;  Surgeon: Winfield Cunas., MD;  Location: [facility] ENDOSCOPY;  Service: Endoscopy;  Laterality: N/A;   CORONARY ARTERY BYPASS GRAFT N/A 01/04/2013   Procedure: CORONARY ARTERY BYPASS GRAFTING (CABG) times two using right saphenous vein harvested with endoscope.;  Surgeon: Ivin Poot, MD;  Location: [facility];  Service: Open Heart Surgery;  Laterality: N/A;   ESOPHAGOGASTRODUODENOSCOPY  07/20/2012   Procedure: ESOPHAGOGASTRODUODENOSCOPY (EGD);  Surgeon: Winfield Cunas., MD;  Location: [facility] ENDOSCOPY;  Service: Endoscopy;  Laterality: N/A;   ESOPHAGOGASTRODUODENOSCOPY N/A 12/02/2013   Procedure: ESOPHAGOGASTRODUODENOSCOPY (EGD);  Surgeon: Winfield Cunas., MD;  Location: [facility];  Service: Endoscopy;  Laterality: N/A;   HOT HEMOSTASIS N/A 12/02/2013   Procedure: HOT HEMOSTASIS (ARGON PLASMA COAGULATION/BICAP);  Surgeon: Winfield Cunas., MD;  Location: [facility];  Service: Endoscopy;  Laterality: N/A;   INTRAOPERATIVE TRANSESOPHAGEAL ECHOCARDIOGRAM N/A 01/04/2013   Procedure: INTRAOPERATIVE TRANSESOPHAGEAL ECHOCARDIOGRAM;  Surgeon: Ivin Poot, MD;  Location: [facility];  Service: Open Heart Surgery;  Laterality: N/A;   INTRAOPERATIVE TRANSTHORACIC ECHOCARDIOGRAM N/A 05/15/2018   Procedure: INTRAOPERATIVE TRANSTHORACIC ECHOCARDIOGRAM;  Surgeon: Burnell Blanks, MD;  Location: [facility] [facility];  Service: Open Heart Surgery;  Laterality: N/A;   IR THORACENTESIS ASP  PLEURAL SPACE W/IMG GUIDE  10/19/2018   LEFT HEART CATHETERIZATION WITH CORONARY ANGIOGRAM N/A 03/06/2012   Procedure: LEFT HEART CATHETERIZATION WITH CORONARY ANGIOGRAM;  Surgeon: [name], MD;  Location: [facility];  Service: Cardiovascular;  Laterality: N/A;   LITHOTRIPSY     [place] Left  01/04/2013   Procedure: RADIAL ARTERY HARVEST;  Surgeon: Ivin Poot, MD;  Location: [facility] [place] [facility];  Service: Vascular;  Laterality: Left;  Artery not havested. Unsuitable for use.   resection mediastinal seminonma     RIGHT/LEFT HEART CATH AND CORONARY/GRAFT ANGIOGRAPHY N/A 11/15/2016   Procedure: Right/Left Heart Cath and Coronary/Graft Angiography;  Surgeon: Leonie Man, MD;  Location: [facility];  Service: Cardiovascular;  Laterality: N/A;   RIGHT/LEFT HEART CATH AND CORONARY/GRAFT ANGIOGRAPHY N/A 04/13/2018   Procedure: RIGHT/LEFT HEART CATH AND CORONARY/GRAFT ANGIOGRAPHY;  Surgeon: Jolaine Artist, MD;  Location: [facility];  Service: Cardiovascular;  Laterality: N/A;   TRANSCATHETER AORTIC VALVE REPLACEMENT, TRANSFEMORAL  05/15/2018   TRANSCATHETER AORTIC VALVE REPLACEMENT, TRANSFEMORAL N/A 05/15/2018   Procedure: TRANSCATHETER AORTIC VALVE REPLACEMENT, TRANSFEMORAL;  Surgeon: Burnell Blanks, MD;  Location: [facility];  Service: Open Heart Surgery;  Laterality: N/A;    Allergies  Allergies  Allergen Reactions   Atorvastatin Other (See Comments)    Muscle pain Other reaction(s): cramps   Adhesive [Tape] Rash    History of Present Illness    [name] is a 70 y.o. male with a hx of hx of CAD, aortic stenosis, and systolic heart failure presents today for annual follow-up appointment last seen by Dr. Radford Pax January 2022.  He had a non-STEMI back in 2013 treated with PCI of the left circumflex, OM.  LHC in 5/14 demonstrated severe two-vessel CAD with critical in-stent stenosis in the left circumflex.  He underwent CABG (SVG-PDA, SVG-L Cx).  He also had a history of aortic stenosis.  Echocardiogram 10/2016 showed moderately reduced LVEF at 35 to 40% with diffuse hypokinesis, mild AS and moderate AR.  Right and left heart catheterization showed mild to moderate AAS with AVA 0.837 cm and EF 45% with patent SVG to RPDA and SVG to OM1 with occlusion of his native  vessels.  He ultimately was diagnosed with low-grade aortic stenosis with ARI and underwent TAVR with a 29 mm Edwards SAPIEN 3 TVH via transfemoral approach after being admitted with acute exacerbation of heart failure on 05/15/2018  His baseline weight in the past has been around 162 pounds but over the past few months he has been running around 154.  He has not been tolerating Entresto due to hypotension.  He has been on a low-dose beta-blocker.  He only takes Lasix as needed for weight greater than 165 and only has been taking recently about twice a week.  He saw Dr. Rayann Heman after repeat 2D echocardiogram showed persistently reduced LVEF with EF 20 to 25%.  Due to active lung cancer he was found not to be candidate for ICD but Dr. Rayann Heman felt he would be a candidate for CRT-D the patient did not want to pursue at that time.  He was also found to have SVT which was unclear whether sinus in origin or atrial tach.  He was already on metoprolol which was increased to 75 mg daily.  2D echocardiogram and Lexiscan Myoview ordered but canceled on follow-up study as heart rate was still not under control and we wanted her heart rate to be  better controlled first.  A D-dimer was elevated and the chest CTA was negative for PE but showed left lower lobe atelectasis versus PNA with early interstitial edema.  He was started on Lasix 40 mg twice daily for 3 days.  He was seen back in our office by Sharrell Ku, PA-C and EKG showed persistent of SVT.  He was started on Corlanor.  It was felt that possibly his CHF is exacerbated by his tachycardia.  His weight was down 5 pounds at that office visit and his Lasix was decreased to 20 mg daily and less than 2 g of sodium diet with 2 L of fluid restriction.  He ultimately underwent BiV ICD implantation on 01/2020 by Dr. Rayann Heman for persistent LV dysfunction with EF less than 25.  At his last appointment he denied any pressure in his chest, chest pain, SOB, DOE, PND, orthopnea, LE  edema, palpitations or syncope.  He had noticed some dizziness from time to time which she describes as losing focus for few seconds and resolved.  He is compliant with all his medications and tolerating all his meds without issue.  When I saw him a week ago he was concerned with his shortness of breath.  He states that he gets short of breath with minimal activity and also sometimes gets short of breath while sitting still.  He feels like it is hard to catch his breath at times.  He has been having trouble sleeping at night and has been sleeping in a recliner.  He cannot sleep flat or on his side.  He has had no dizziness or lightheadedness.  He does not take his blood pressure at home and he is encouraged to do so daily.  He does notice at times that his heart beat is a little bit faster but then returns to normal.  He does have a history of SVT and is currently on Corlanor.  He did not understand why he was prescribed this medication and actually had stopped taking it for a few months.  I explained that this medication is to slow his heart rate down to decrease the episodes of SVT.  It also will help with his chronic CHF symptoms.  He has been working with Legrand Como over at [facility] one of our pharmacists to try to get this medication for a better price. He also was taken off his ASA by Dr. Radford Pax per the patient which may have been a miscommunication. He was on DAPT post TAVR and now is not taking anything. When he comes back in a week we will plan to restart this, he does not have any contraindications to taking ASA 81mg  daily.  He does admit to eating more salt recently due to eating out.  He is recently separated and has been eating out more than usual.  We discussed sticking to a low-sodium diet and continuing to monitor his weights daily.  He did have a thoracentesis for a pleural effusion done over a year ago.  He denies any chest pain or chest pressure.  He does have 1-2+ pitting lower extremity  edema as well as JVD today on exam. It does sound like he has orthopnea and PND.   Today, he feels better from a fluid overload standpoint.  He says he is breathing and sleeping much better since we increased his Lasix for a week.  He does not have and has never had any chest pain.  When he presented for CABG back in 2013 he  only presented with shortness of breath.  He is concerned that his coronaries are potentially the cause and not just his heart failure.  I think it is reasonable to order another stress test since his last one was back in 2019 before he got his TAVR placed.  I also think it is reasonable to order an echocardiogram since he has not had one in over a year.  We have changed his Lasix to 20 mg daily to maintain his dry weight which I believe to be around 160 pounds.  We did discuss initiating Wilder Glade or Vania Rea but he would like to try the 20 mg daily of Lasix for now and discuss those medications when he sees Dr. Radford Pax in January.  We also provided him with an elastic therapy handout for lower extremity edema.  We have ordered some labs today including a BMP, BNP, and CBC.  His kidney function was at his baseline when we checked it before his increase in Lasix but I want to check again before continuing his daily Lasix therapy.  I think that his heart failure medical regimen can be much better optimized.  He did bring in a log of blood pressures which were mostly in a good range with a few outliers in the 161W systolic.  His heart rate was mostly in the 90s but there was 1 outlier of 130 bpm.  We have discussed sitting down for 5 to 10 minutes before taking his blood pressure and heart rate to have more accurate readings.  I have continue to encourage daily weights after using the bathroom and before breakfast and keeping a log.  If he starts to get short of breath or notice lower extremity edema again he needs to call our office.  Reports no shortness of breath nor dyspnea on exertion.  Reports no chest pain, pressure, or tightness. Small amount of edema (much improved) , no orthopnea, PND. Reports no palpitations.     EKGs/Labs/Other Studies Reviewed:   The following studies were reviewed today:  Echocardiogram 12/2019  IMPRESSIONS     1. Left ventricular ejection fraction, by estimation, is <20%. The left  ventricle has severely decreased function. The left ventricle demonstrates  global hypokinesis.The average left ventricular global longitudinal strain  is abnormal at -10.7 %.   2. Right ventricular systolic function is mildly reduced. The right  ventricular size is mildly enlarged. There is moderately elevated  pulmonary artery systolic pressure. The estimated right ventricular  systolic pressure is 96.0 mmHg.   3. The mitral valve is normal in structure. Mild to moderate mitral valve  regurgitation. No evidence of mitral stenosis.   4. The aortic valve has been repaired/replaced. Aortic valve  regurgitation is not visualized. No aortic stenosis is present. There is a  29 mm Edwards Sapien prosthetic (TAVR) valve present in the aortic  position. Procedure Date: 05/15/18. Aortic valve  area, by VTI measures 3.17 cm. Aortic valve mean gradient measures 6.0  mmHg. Aortic valve Vmax measures 1.64 m/s.   5. The inferior vena cava is dilated in size with <50% respiratory  variability, suggesting right atrial pressure of 15 mmHg.   6. Left atrial size was moderately dilated.   Comparison(s): 06/05/19 Ef 30%. AV 29mmHg mean PG. 72mmHg peak PG.   FINDINGS   Left Ventricle: Left ventricular ejection fraction, by estimation, is  <20%. The left ventricle has severely decreased function. The left  ventricle demonstrates global hypokinesis. The average left ventricular  global longitudinal strain is -10.7 %.  The  left ventricular internal cavity size was normal in size. There is no left  ventricular hypertrophy. Left ventricular diastolic function could not be   evaluated.   Right Ventricle: The right ventricular size is mildly enlarged. No  increase in right ventricular wall thickness. Right ventricular systolic  function is mildly reduced. There is moderately elevated pulmonary artery  systolic pressure. The tricuspid  regurgitant velocity is 3.26 m/s, and with an assumed right atrial  pressure of 15 mmHg, the estimated right ventricular systolic pressure is  90.2 mmHg.   Left Atrium: Left atrial size was moderately dilated.   Right Atrium: Right atrial size was normal in size.   Pericardium: There is no evidence of pericardial effusion.   Mitral Valve: The mitral valve is normal in structure. There is moderate  thickening of the mitral valve leaflet(s). Normal mobility of the mitral  valve leaflets. Moderate mitral annular calcification. Mild to moderate  mitral valve regurgitation. No  evidence of mitral valve stenosis.   Tricuspid Valve: The tricuspid valve is normal in structure. Tricuspid  valve regurgitation is trivial. No evidence of tricuspid stenosis.   Aortic Valve: The aortic valve has been repaired/replaced. Aortic valve  regurgitation is not visualized. No aortic stenosis is present. Aortic  valve mean gradient measures 6.0 mmHg. Aortic valve peak gradient measures  10.8 mmHg. Aortic valve area, by VTI   measures 3.17 cm. There is a 29 mm Edwards Sapien prosthetic, stented  (TAVR) valve present in the aortic position. Procedure Date: 05/15/18.   Pulmonic Valve: The pulmonic valve was normal in structure. Pulmonic valve  regurgitation is not visualized. No evidence of pulmonic stenosis.   Aorta: The aortic root is normal in size and structure.   Venous: The inferior vena cava is dilated in size with less than 50%  respiratory variability, suggesting right atrial pressure of 15 mmHg.   IAS/Shunts: No atrial level shunt detected by color flow Doppler.    Cardiac catheterization 03/2018  Ost 1st Diag to 1st Diag  lesion is 50% stenosed. Prox LAD to Mid LAD lesion is 40% stenosed. Ost RCA lesion is 100% stenosed. SVG and is normal in caliber and large. SVG and is large. Ost Cx to Prox Cx lesion is 100% stenosed. Ost LAD to Prox LAD lesion is 40% stenosed.   Findings:   Ao =109/68 (86) LV = 117/29 RA = 19 RV = 54/17 PA = 62/21 (42) PCW = 40 Fick cardiac output/index = 3.5/1.78 PVR = 0.6 WU FA sat = 96% PA sat = 58%, 61% PAPi = 2.2 RA/PCWP = 0.475 CPO = 0.67 Av gradient: peak 15.0 mmHG  Mean 14.57mmHG  AVA = 0.89 cm2   Assessment: 1. Severe 2v CAD with total occlusion of ostial RCA and LCx 2. Moderate non-obstructive CAD in LAD 3. EF 30% by echo (no v-gram done due to high LVEDP) 4. Severe low gradient AS with reduced cardiac output and markedly elevated filling pressures   Plan/Discussion:   1) Continue diuresis - I increased lasix to 80 IVT tid. Cut lopressor to 50 daily with low output 2) Continue with TAVR w/up. D/w Drs. Ellyn Hack and [place]  EKG:  EKG is  ordered today.  The ekg ordered today demonstrates LBBB    Recent Labs: 01/06/2021: Hemoglobin 14.4; Platelets 219 07/26/2021: BUN 18; Creatinine, Ser 1.21; Potassium 4.3; Sodium 144  Recent Lipid Panel    Component Value Date/Time   CHOL 127 06/13/2019 0846   TRIG 47 06/13/2019 0846  HDL 42 06/13/2019 0846   CHOLHDL 3.0 06/13/2019 0846   CHOLHDL 2.9 04/12/2018 0216   VLDL 7 04/12/2018 0216   LDLCALC 74 06/13/2019 0846      Home Medications   Current Meds  Medication Sig   amitriptyline (ELAVIL) 25 MG tablet Take 25 mg by mouth at bedtime.    amoxicillin (AMOXIL) 500 MG capsule TAKE 4 CAPSULES BY MOUTH 1 HOUR PRIOR TO DENTAL PROCEDURES   B Complex-C (SUPER B COMPLEX PO) Take 1 tablet by mouth daily.   B-D ULTRAFINE III SHORT PEN 31G X 8 MM MISC 1 each by Other route daily as needed (GLUCOSE TESTING (INSULINE NEEDLE)).    furosemide (LASIX) 20 MG tablet TAKE 1 TABLET(20 MG) BY MOUTH DAILY   insulin detemir  (LEVEMIR) 100 UNIT/ML injection Inject 10-18 Units into the skin See admin instructions. 18 units in the morning, 10 units at bedtime   insulin lispro (HUMALOG) 100 UNIT/ML KwikPen Inject 2-3 Units into the skin See admin instructions. Per sliding scale at Breakfast and Dinner   ivabradine (CORLANOR) 5 MG TABS tablet Take 1 tablet (5 mg total) by mouth 2 (two) times daily. Please keep upcoming appointment for future refills. Thank you   levothyroxine (SYNTHROID, LEVOTHROID) 50 MCG tablet Take 50 mcg by mouth daily before breakfast.    losartan (COZAAR) 25 MG tablet TAKE 1 TABLET(25 MG) BY MOUTH DAILY   metFORMIN (GLUCOPHAGE) 500 MG tablet Take 2 tablets (1,000 mg total) by mouth 2 (two) times daily with a meal.   metoprolol succinate (TOPROL-XL) 25 MG 24 hr tablet TAKE 3 TABLETS(75 MG) BY MOUTH DAILY WITH OR IMMEDIATELY FOLLOWING A MEAL   Multiple Vitamin (MULTIVITAMIN) tablet Take 1 tablet by mouth daily.   Omega-3 Fatty Acids (FISH OIL PO) Take by mouth.   ONE TOUCH ULTRA TEST test strip 1 each by Other route as needed for other.    simvastatin (ZOCOR) 20 MG tablet Take 20 mg by mouth every evening.    simvastatin (ZOCOR) 40 MG tablet Take 20 mg by mouth daily.   zolpidem (AMBIEN) 5 MG tablet Take 2.5 mg by mouth at bedtime.     Review of Systems      All other systems reviewed and are otherwise negative except as noted above.  Physical Exam    VS:  BP 132/82    Pulse 96    Ht 5\' 11"  (1.803 m)    Wt 163 lb 3.2 oz (74 kg)    BMI 22.76 kg/m  , BMI Body mass index is 22.76 kg/m.  Wt Readings from Last 3 Encounters:  08/03/21 163 lb 3.2 oz (74 kg)  07/26/21 165 lb 6.4 oz (75 kg)  05/06/21 159 lb 12.8 oz (72.5 kg)     GEN: Well nourished, well developed, in no acute distress. HEENT: normal. Neck: Supple, no JVD,  no carotid bruits, or masses. Cardiac: RRR, no murmurs, rubs, or gallops. Very little pitting bilateral lower ext edema.  Radials/PT and equal bilaterally.  Respiratory:   Respirations regular and unlabored, clear to auscultation bilaterally GI: Soft, nontender, nondistended. MS: No deformity or atrophy. Skin: Warm and dry, no rash. Neuro:  Strength and sensation are intact. Psych: Normal affect.  Assessment & Plan    Chronic systolic CHF -Baseline weight 160 lbs. He weighs 161 lbs (at home).   -His echocardiogram in 01/2019 showed EF 20 to 25% and echocardiogram 01/2020 showed LVEF less than 20% -Status post BiV AICD June 2021 -We will order another  Echo today -He does not appear fluid overloaded on exam. Small amount of pitting edema in bilateral lower extremities but much better compared to last week -Continue GDMT:  Lasix, Corlanor, and Toprol-XL -Entresto discontinued due to hypotension -BMP, BNP, and CBC ordered today   CAD s/p CABG -Status post non-STEMI in 2013 treated with PCI of the left circumflex/OM -Left heart cath in 5/14 demonstrated severe two-vessel CAD with critical in-stent restenosis in the circumflex -He underwent CABG (SVG-PDA, SVG-LCx) -Repeat left heart cath showed patent SVG to RPDA, SVG to OM1, 50% ostial D1, 40% proximal to mid LAD, occluded ostial RCA, occluded ostial left circumflex, 40% ostial LAD -No current chest pain but never had chest pain. He originally presented with SOB with his CAD -Continue beta-blocker and statin -he was on ASA in the past but had a hx of GI bleed and nose bleeds he thinks. Will not restart  Hypertension -BP well controlled today -Continue to take your blood pressure at home -Continue low-sodium diet -Continue current medications Toprol-XL 75 mg daily -HR remains in the 90s most of the time at home   Severe low-grade low output AS -Status post TAVR 10/19 with 29 mm Edwards SAPIEN 3 THV via transfemoral approach -2D echo 06/05/2019 showed a stable aortic valve TAVR with trivial perivalvular AR.  Mean aortic valve gradient 8 mmHg -repeat Echo  Hyperlipidemia goal LDL less than 70 -LDL back  in December 2021 was elevated at 115 -He is currently on Zocor 20 mg daily -Lipid Panel and LFTs in January   Tachycardia/ Hx SVT -Maintaining normal sinus rhythm on exam -Continue Corlanor 5 mg twice daily and Toprol XL 75 mg daily -TSH normal   Disposition: Follow up in 1 month with Fransico Him, MD or APP.  Signed, Elgie Collard, PA-C 08/03/2021, 10:47 AM [place]

## 2021-08-03 ENCOUNTER — Encounter (HOSPITAL_BASED_OUTPATIENT_CLINIC_OR_DEPARTMENT_OTHER): Payer: Self-pay | Admitting: Physician Assistant

## 2021-08-03 ENCOUNTER — Encounter (HOSPITAL_BASED_OUTPATIENT_CLINIC_OR_DEPARTMENT_OTHER): Payer: Self-pay | Admitting: Cardiology

## 2021-08-03 ENCOUNTER — Ambulatory Visit (INDEPENDENT_AMBULATORY_CARE_PROVIDER_SITE_OTHER): Payer: Medicare Other | Admitting: Physician Assistant

## 2021-08-03 ENCOUNTER — Ambulatory Visit (INDEPENDENT_AMBULATORY_CARE_PROVIDER_SITE_OTHER): Payer: Medicare Other

## 2021-08-03 ENCOUNTER — Other Ambulatory Visit: Payer: Self-pay

## 2021-08-03 VITALS — BP 132/82 | HR 96 | Ht 71.0 in | Wt 163.2 lb

## 2021-08-03 DIAGNOSIS — I2510 Atherosclerotic heart disease of native coronary artery without angina pectoris: Secondary | ICD-10-CM

## 2021-08-03 DIAGNOSIS — I471 Supraventricular tachycardia: Secondary | ICD-10-CM | POA: Diagnosis not present

## 2021-08-03 DIAGNOSIS — E785 Hyperlipidemia, unspecified: Secondary | ICD-10-CM

## 2021-08-03 DIAGNOSIS — I5022 Chronic systolic (congestive) heart failure: Secondary | ICD-10-CM

## 2021-08-03 DIAGNOSIS — I10 Essential (primary) hypertension: Secondary | ICD-10-CM | POA: Diagnosis not present

## 2021-08-03 DIAGNOSIS — E1165 Type 2 diabetes mellitus with hyperglycemia: Secondary | ICD-10-CM | POA: Diagnosis not present

## 2021-08-03 DIAGNOSIS — Z952 Presence of prosthetic heart valve: Secondary | ICD-10-CM | POA: Diagnosis not present

## 2021-08-03 DIAGNOSIS — Z95810 Presence of automatic (implantable) cardiac defibrillator: Secondary | ICD-10-CM

## 2021-08-03 DIAGNOSIS — E039 Hypothyroidism, unspecified: Secondary | ICD-10-CM | POA: Diagnosis not present

## 2021-08-03 NOTE — Progress Notes (Signed)
Received: Today Elgie Collard, PA-C  Jerrad Mendibles Panda, RN Thank you! This corresponded with him clinically today. : )

## 2021-08-03 NOTE — Progress Notes (Signed)
EPIC Encounter for ICM Monitoring  Patient Name: [name] is a 70 y.o. male Date: 08/03/2021 Primary Care Physican: [name], MD Primary Cardiologist: Radford Pax Electrophysiologist: [place] 05/26/2021 Weight: 151 lbs 07/26/2021 164 lbs  08/03/2021 161 lbs                                                            Spoke with patient.  Repeat transmission reviewed after pt advised to take Furosemide 40 mg daily x 1 week. Weight dropped total 6 pounds but did regain 3 lbs since last ICM check.   Feet remain swollen.   CorVue thoracic impedance returned to normal 12/16 in response to taking Furosemide 40 mg x 1 week.   Impedance suggesting possible fluid accumulation x 27 days in past month.   Prescribed: Furosemide 20 mg take 1 tablet daily.     Labs: 07/26/2021 Creatinine 1.21, BUN 18, Potassium 4.3, Sodium 144, GFR 64 01/06/2021 Creatinine 1.24, BUN 18, Potassium 4.4, Sodium 138, GFR 62 A complete set of results can be found in Results Review.   Recommendations:  Advised to take all meds as prescaribed and to call if fluid symptoms return.  Copy sent to Nicholes Rough, [place] for review at 12/20 OV.    Follow-up plan: ICM clinic phone appointment on 08/31/2021.   91 day device clinic remote transmission 08/11/2021.     EP/Cardiology Office Visits:    12/202022 with Nicholes Rough, PA.   Recall 05/14/2021 with Dr Rayann Heman.  Recall 08/28/2021 with Dr Radford Pax.     Copy of ICM check sent to Dr. Rayann Heman.   3 month ICM trend: 08/03/2021.    12-14 Month ICM trend:       [name], RN 08/03/2021 7:43 AM

## 2021-08-03 NOTE — Patient Instructions (Signed)
Medication Instructions:  Your physician has recommended you make the following change in your medication:   START lasix 20mg  daily  *If you need a refill on your cardiac medications before your next appointment, please call your pharmacy*   Lab Work: Your physician recommends that you return for lab work today: BMP, BNP, CBC  If you have labs (blood work) drawn today and your tests are completely normal, you will receive your results only by: MyChart Message (if you have MyChart) OR A paper copy in the mail If you have any lab test that is abnormal or we need to change your treatment, we will call you to review the results.   Testing/Procedures: Your physician has requested that you have an echocardiogram. Echocardiography is a painless test that uses sound waves to create images of your heart. It provides your doctor with information about the size and shape of your heart and how well your hearts chambers and valves are working. This procedure takes approximately one hour. There are no restrictions for this procedure.   Your physician has requested that you have a lexiscan myoview.  Please follow instruction sheet, as given.    Follow-Up: At [facility], you and your health needs are our priority.  As part of our continuing mission to provide you with exceptional heart care, we have created designated Provider Care Teams.  These Care Teams include your primary Cardiologist (physician) and Advanced Practice Providers (APPs -  Physician Assistants and Nurse Practitioners) who all work together to provide you with the care you need, when you need it.  We recommend signing up for the patient portal called "MyChart".  Sign up information is provided on this After Visit Summary.  MyChart is used to connect with patients for Virtual Visits (Telemedicine).  Patients are able to view lab/test results, encounter notes, upcoming appointments, etc.  Non-urgent messages can be sent to your  provider as well.   To learn more about what you can do with MyChart, go to [email or web address].    Your next appointment:   As scheduled with Dr. Radford Pax in January  Other Instructions

## 2021-08-04 ENCOUNTER — Telehealth: Payer: Self-pay | Admitting: Pharmacist

## 2021-08-04 LAB — CBC
Hematocrit: 43.3 % (ref 37.5–51.0)
Hemoglobin: 13.6 g/dL (ref 13.0–17.7)
MCH: 24.9 pg — ABNORMAL LOW (ref 26.6–33.0)
MCHC: 31.4 g/dL — ABNORMAL LOW (ref 31.5–35.7)
MCV: 79 fL (ref 79–97)
Platelets: 273 10*3/uL (ref 150–450)
RBC: 5.46 x10E6/uL (ref 4.14–5.80)
RDW: 16 % — ABNORMAL HIGH (ref 11.6–15.4)
WBC: 3.8 10*3/uL (ref 3.4–10.8)

## 2021-08-04 LAB — BASIC METABOLIC PANEL
BUN/Creatinine Ratio: 15 (ref 10–24)
BUN: 20 mg/dL (ref 8–27)
CO2: 24 mmol/L (ref 20–29)
Calcium: 9.2 mg/dL (ref 8.6–10.2)
Chloride: 100 mmol/L (ref 96–106)
Creatinine, Ser: 1.31 mg/dL — ABNORMAL HIGH (ref 0.76–1.27)
Glucose: 147 mg/dL — ABNORMAL HIGH (ref 70–99)
Potassium: 4.4 mmol/L (ref 3.5–5.2)
Sodium: 140 mmol/L (ref 134–144)
eGFR: 58 mL/min/{1.73_m2} — ABNORMAL LOW (ref >59)

## 2021-08-04 LAB — BRAIN NATRIURETIC PEPTIDE: BNP: 1255.6 pg/mL — ABNORMAL HIGH (ref 0.0–100.0)

## 2021-08-04 MED ORDER — METOPROLOL SUCCINATE ER 100 MG PO TB24: 100.0000 mg | ORAL_TABLET | Freq: Every day | ORAL | 5 refills | Status: DC

## 2021-08-04 NOTE — Telephone Encounter (Signed)
Scheduled pt for HF appt with PharmD per Nicholes Rough to help optimize CHF meds. Pt currently only taking Toprol 75mg  daily, losartan 25mg  daily, Lasix 20mg  daily, and Corlanor 5mg  BID.  Called pt and scheduled appt 1/16 with PharmD, same day he has echo and stress test. Also increased his Toprol from 75mg  to 100mg  in the mean time given elevated HR. Will plan to increase to 200mg  daily at next visit and hopefully BP will have room to increase losartan dose or add spironolactone. Previously intolerant to Entresto (hypotension) and wanted to discuss SGLT2i with Dr Radford Pax before starting. Sees her on 1/20.

## 2021-08-05 ENCOUNTER — Telehealth (HOSPITAL_BASED_OUTPATIENT_CLINIC_OR_DEPARTMENT_OTHER): Payer: Self-pay

## 2021-08-05 DIAGNOSIS — Z79899 Other long term (current) drug therapy: Secondary | ICD-10-CM

## 2021-08-05 DIAGNOSIS — I509 Heart failure, unspecified: Secondary | ICD-10-CM

## 2021-08-05 NOTE — Telephone Encounter (Signed)
Called pt to go over lab work results. Pt. Is going to continue his lasix 20mg  daily and report back in two weeks for repeat labs.    "Please let the patient know the following:   Your kidney numbers are up slightly. I feel good about you continuing your lasix 20mg  daily. BNP is elevated which suggests you are still holding onto some fluid. All your other labs are normal.    I would like to repeat a BMP and BNP in 2 weeks to recheck your kidneys and fluid status. If you have any other questions please let me know.   -Nicholes Rough, PA-C"  Lab slips mailed to patient with approximate date for lab work.

## 2021-08-05 NOTE — Telephone Encounter (Signed)
-----   Message from Elgie Collard, [place] sent at 08/05/2021  8:24 AM EST ----- Please let the patient know the following:  Your kidney numbers are up slightly. I feel good about you continuing your lasix 20mg  daily. BNP is elevated which suggests you are still holding onto some fluid. All your other labs are normal.   I would like to repeat a BMP and BNP in 2 weeks to recheck your kidneys and fluid status. If you have any other questions please let me know.  -Nicholes Rough, PA-C

## 2021-08-11 ENCOUNTER — Ambulatory Visit (INDEPENDENT_AMBULATORY_CARE_PROVIDER_SITE_OTHER): Payer: Medicare Other

## 2021-08-11 DIAGNOSIS — I5022 Chronic systolic (congestive) heart failure: Secondary | ICD-10-CM | POA: Diagnosis not present

## 2021-08-11 LAB — CUP PACEART REMOTE DEVICE CHECK
Battery Remaining Longevity: 87 mo
Battery Remaining Percentage: 83 %
Battery Voltage: 3.01 V
Brady Statistic AP VP Percent: 0 %
Brady Statistic AP VS Percent: 1 %
Brady Statistic AS VP Percent: 1 %
Brady Statistic AS VS Percent: 99 %
Brady Statistic RA Percent Paced: 1 %
Brady Statistic RV Percent Paced: 1 %
Date Time Interrogation Session: 20221228040017
HighPow Impedance: 47 Ohm
HighPow Impedance: 47 Ohm
Implantable Lead Implant Date: 20210629
Implantable Lead Implant Date: 20210629
Implantable Lead Location: 753859
Implantable Lead Location: 753860
Implantable Pulse Generator Implant Date: 20210629
Lead Channel Impedance Value: 390 Ohm
Lead Channel Impedance Value: 440 Ohm
Lead Channel Pacing Threshold Amplitude: 0.5 V
Lead Channel Pacing Threshold Amplitude: 0.75 V
Lead Channel Pacing Threshold Pulse Width: 0.5 ms
Lead Channel Pacing Threshold Pulse Width: 0.5 ms
Lead Channel Sensing Intrinsic Amplitude: 1 mV
Lead Channel Sensing Intrinsic Amplitude: 11.9 mV
Lead Channel Setting Pacing Amplitude: 2 V
Lead Channel Setting Pacing Amplitude: 2.5 V
Lead Channel Setting Pacing Pulse Width: 0.5 ms
Lead Channel Setting Sensing Sensitivity: 0.5 mV
Pulse Gen Serial Number: 9932570

## 2021-08-13 ENCOUNTER — Telehealth: Payer: Self-pay | Admitting: Cardiology

## 2021-08-13 DIAGNOSIS — E039 Hypothyroidism, unspecified: Secondary | ICD-10-CM | POA: Diagnosis not present

## 2021-08-13 DIAGNOSIS — G4700 Insomnia, unspecified: Secondary | ICD-10-CM | POA: Diagnosis not present

## 2021-08-13 DIAGNOSIS — N1831 Chronic kidney disease, stage 3a: Secondary | ICD-10-CM | POA: Diagnosis not present

## 2021-08-13 DIAGNOSIS — E1122 Type 2 diabetes mellitus with diabetic chronic kidney disease: Secondary | ICD-10-CM | POA: Diagnosis not present

## 2021-08-13 DIAGNOSIS — I10 Essential (primary) hypertension: Secondary | ICD-10-CM | POA: Diagnosis not present

## 2021-08-13 DIAGNOSIS — E785 Hyperlipidemia, unspecified: Secondary | ICD-10-CM | POA: Diagnosis not present

## 2021-08-13 DIAGNOSIS — D500 Iron deficiency anemia secondary to blood loss (chronic): Secondary | ICD-10-CM | POA: Diagnosis not present

## 2021-08-13 DIAGNOSIS — I2510 Atherosclerotic heart disease of native coronary artery without angina pectoris: Secondary | ICD-10-CM | POA: Diagnosis not present

## 2021-08-13 NOTE — Telephone Encounter (Signed)
Spoke with [name], [facility] [place].  He has been helping pt with his medications and their cost.  Currently working on assistance for [employer].  He was calling to inquire if Dr. Radford Pax would be willing to prescribe Farxiga for pt to help with BP control and HF benefits.  Advised I will send message to Dr. Radford Pax for review and advisement.

## 2021-08-13 NOTE — Telephone Encounter (Signed)
New Message:     Please call, he needs to talk to you about the pt's medicine.

## 2021-08-14 NOTE — Telephone Encounter (Signed)
For sure. Can we send farxiga 10mg  to his pharmacy.

## 2021-08-17 ENCOUNTER — Telehealth: Payer: Self-pay

## 2021-08-17 NOTE — Telephone Encounter (Signed)
Pt returned call and assisted with sending device remote transmission.   Patient has followed medication recommendations given at last OV with Nicholes Rough PA on 12/20 which includes:  Taking Lasix 40 mg daily x 1 week then he self adjusted to taking 20 mg alternating with 40 mg every other day x 1 week and now back at 20 mg daily Taking Corlanor 5 mg 1 tablet twice a day Is not following diet recommendation to limiting salt intake  He reports taking the recommended meds have improved SOB and leg swelling but he is still very concerned that he has SOB when walking to mailbox requiring him to stop to catch his breath.  He also occasionally has SOB at rest.   He does not think taking Lasix 20 mg daily has resolved the SOB.   He reports he has lab scheduled for 1/16.  08/03/2021 Creatinine 1.31, BUN 20, Potassium 4.4, Sodium 140, GFR 58  Recommendations:  Advised will send to Nicholes Rough, PA for review since she provided last OV evaluation.  Pt has office appt with Dr Radford Pax, 1/20 but he is requesting to be seen sooner.  Echo scheduled for 1/16   CorVue thoracic impedance normal since 1/1 but was suggesting possible fluid accumulation 12/25 - 12/31.

## 2021-08-17 NOTE — Telephone Encounter (Signed)
Pt called asking how much Lasix should he be taking daily.  Advised prescription is Lasix 20 mg take 1 tablet daily.  He did increase Lasix x 1 week as recommended at 07/26/21 OV with Nicholes Rough PA. He is not at home at the time of the call and will call back so assistance can be provided to send remote transmission for fluid level recheck.  He reports he continues to have SOB despite taking Lasix 20 mg daily as prescribed. Will wait for call back to assist with remote transmission today.

## 2021-08-18 MED ORDER — FUROSEMIDE 20 MG PO TABS: 40.0000 mg | ORAL_TABLET | Freq: Every day | ORAL | 3 refills | Status: DC

## 2021-08-18 NOTE — Telephone Encounter (Signed)
Attempted patient call to advise of Nicholes Rough PA and Terie Purser PA recommendations.  No answer and no voice mail option.

## 2021-08-18 NOTE — Telephone Encounter (Signed)
Thanks Norco! That sounds like a great plan.      Loel Dubonnet, NP  You; Elgie Collard, PA-C 27 minutes ago (8:26 AM)   He has an appointment with the pharmacist 08/30/20 so his medications could be further adjusted at that visit. He can have labs collected at that visit - I have updated the appointment notes so the pharmacist is aware. As he has that appointment, I do not think he has to travel to [place] next week as their schedules are already full.   Loel Dubonnet, NP       Elgie Collard, PA-C  You; Loel Dubonnet, NP 13 hours ago (7:16 PM)   I would tell him to increase lasix back to 40mg  daily. We will need to order a BMP to check renal function. Please arrange f/u with me next week. I will be in the [place] office, so I guess make sure the patient is okay to see me there. Otherwise, I doubt Dr. Radford Pax has anything earlier. Tell him he MUST maintain a low-sodium diet. He also MUST weight daily and record those weights.   The ultimate goal is to get him on Farxiga for better fluid status control but the patient was hesitant about this and wanted to discuss with Dr. Radford Pax before making a decision.   Thanks!   Nicholes Rough, PA-C

## 2021-08-18 NOTE — Addendum Note (Signed)
Addended by: Loel Dubonnet on: 08/18/2021 08:31 AM   Modules accepted: Orders

## 2021-08-18 NOTE — Telephone Encounter (Signed)
Spoke with patient.  Advised Bill Rough, PA recommended to increase Lasix to 40 mg daily and reminded him to follow low salt diet. Reminders to record daily weights and he has OV with pharmacist on 1/16 to review all his meds.  He has enough supply on hand to take 40 mg Furosemide daily.  Advised to call pharmacy when he needs refill. Encouraged to call if symptoms do not improve or use ER if needed.  He appreciated the call back.

## 2021-08-23 ENCOUNTER — Telehealth (HOSPITAL_COMMUNITY): Payer: Self-pay | Admitting: *Deleted

## 2021-08-23 DIAGNOSIS — I509 Heart failure, unspecified: Secondary | ICD-10-CM | POA: Diagnosis not present

## 2021-08-23 DIAGNOSIS — Z79899 Other long term (current) drug therapy: Secondary | ICD-10-CM | POA: Diagnosis not present

## 2021-08-23 NOTE — Progress Notes (Signed)
Remote ICD transmission.   

## 2021-08-23 NOTE — Telephone Encounter (Signed)
Patient given detailed instructions per Myocardial Perfusion Study Information Sheet for the test on 08/30/21 at 10:30. Patient notified to arrive 15 minutes early and that it is imperative to arrive on time for appointment to keep from having the test rescheduled.  If you need to cancel or reschedule your appointment, please call the office within 24 hours of your appointment. . Patient verbalized understanding.[name]

## 2021-08-24 LAB — BASIC METABOLIC PANEL
BUN/Creatinine Ratio: 15 (ref 10–24)
BUN: 20 mg/dL (ref 8–27)
CO2: 22 mmol/L (ref 20–29)
Calcium: 9.4 mg/dL (ref 8.6–10.2)
Chloride: 101 mmol/L (ref 96–106)
Creatinine, Ser: 1.33 mg/dL — ABNORMAL HIGH (ref 0.76–1.27)
Glucose: 258 mg/dL — ABNORMAL HIGH (ref 70–99)
Potassium: 4.6 mmol/L (ref 3.5–5.2)
Sodium: 139 mmol/L (ref 134–144)
eGFR: 57 mL/min/{1.73_m2} — ABNORMAL LOW (ref >59)

## 2021-08-24 LAB — BRAIN NATRIURETIC PEPTIDE: BNP: 2029.3 pg/mL — ABNORMAL HIGH (ref 0.0–100.0)

## 2021-08-30 ENCOUNTER — Emergency Department (HOSPITAL_COMMUNITY): Payer: Medicare Other

## 2021-08-30 ENCOUNTER — Encounter (HOSPITAL_COMMUNITY): Payer: Self-pay

## 2021-08-30 ENCOUNTER — Other Ambulatory Visit: Payer: Self-pay

## 2021-08-30 ENCOUNTER — Ambulatory Visit (HOSPITAL_COMMUNITY): Payer: Medicare Other

## 2021-08-30 ENCOUNTER — Encounter: Payer: Medicare Other | Admitting: Pharmacist

## 2021-08-30 ENCOUNTER — Ambulatory Visit (HOSPITAL_BASED_OUTPATIENT_CLINIC_OR_DEPARTMENT_OTHER): Payer: Medicare Other

## 2021-08-30 ENCOUNTER — Inpatient Hospital Stay (HOSPITAL_COMMUNITY)
Admission: EM | Admit: 2021-08-30 | Discharge: 2021-09-03 | DRG: 291 | Disposition: A | Discharge status: Home / Self Care | Payer: Medicare Other | Attending: Internal Medicine | Admitting: Internal Medicine

## 2021-08-30 ENCOUNTER — Other Ambulatory Visit: Payer: Medicare Other | Admitting: *Deleted

## 2021-08-30 ENCOUNTER — Encounter: Payer: Self-pay | Admitting: Cardiovascular Disease

## 2021-08-30 DIAGNOSIS — Z79899 Other long term (current) drug therapy: Secondary | ICD-10-CM

## 2021-08-30 DIAGNOSIS — M1990 Unspecified osteoarthritis, unspecified site: Secondary | ICD-10-CM | POA: Diagnosis present

## 2021-08-30 DIAGNOSIS — I5022 Chronic systolic (congestive) heart failure: Secondary | ICD-10-CM

## 2021-08-30 DIAGNOSIS — Z87891 Personal history of nicotine dependence: Secondary | ICD-10-CM

## 2021-08-30 DIAGNOSIS — Z833 Family history of diabetes mellitus: Secondary | ICD-10-CM | POA: Diagnosis not present

## 2021-08-30 DIAGNOSIS — I2510 Atherosclerotic heart disease of native coronary artery without angina pectoris: Secondary | ICD-10-CM | POA: Diagnosis not present

## 2021-08-30 DIAGNOSIS — E1140 Type 2 diabetes mellitus with diabetic neuropathy, unspecified: Secondary | ICD-10-CM | POA: Diagnosis present

## 2021-08-30 DIAGNOSIS — E8770 Fluid overload, unspecified: Secondary | ICD-10-CM | POA: Diagnosis not present

## 2021-08-30 DIAGNOSIS — Z20822 Contact with and (suspected) exposure to covid-19: Secondary | ICD-10-CM | POA: Diagnosis present

## 2021-08-30 DIAGNOSIS — Z952 Presence of prosthetic heart valve: Secondary | ICD-10-CM | POA: Diagnosis not present

## 2021-08-30 DIAGNOSIS — J9811 Atelectasis: Secondary | ICD-10-CM | POA: Diagnosis not present

## 2021-08-30 DIAGNOSIS — Z91119 Patient's noncompliance with dietary regimen due to unspecified reason: Secondary | ICD-10-CM

## 2021-08-30 DIAGNOSIS — Z95810 Presence of automatic (implantable) cardiac defibrillator: Secondary | ICD-10-CM

## 2021-08-30 DIAGNOSIS — Z8249 Family history of ischemic heart disease and other diseases of the circulatory system: Secondary | ICD-10-CM

## 2021-08-30 DIAGNOSIS — I255 Ischemic cardiomyopathy: Secondary | ICD-10-CM | POA: Diagnosis present

## 2021-08-30 DIAGNOSIS — Z953 Presence of xenogenic heart valve: Secondary | ICD-10-CM | POA: Diagnosis not present

## 2021-08-30 DIAGNOSIS — J90 Pleural effusion, not elsewhere classified: Secondary | ICD-10-CM | POA: Diagnosis not present

## 2021-08-30 DIAGNOSIS — R0602 Shortness of breath: Secondary | ICD-10-CM | POA: Diagnosis not present

## 2021-08-30 DIAGNOSIS — E0822 Diabetes mellitus due to underlying condition with diabetic chronic kidney disease: Secondary | ICD-10-CM

## 2021-08-30 DIAGNOSIS — Z951 Presence of aortocoronary bypass graft: Secondary | ICD-10-CM

## 2021-08-30 DIAGNOSIS — Z794 Long term (current) use of insulin: Secondary | ICD-10-CM

## 2021-08-30 DIAGNOSIS — I10 Essential (primary) hypertension: Secondary | ICD-10-CM | POA: Diagnosis present

## 2021-08-30 DIAGNOSIS — I447 Left bundle-branch block, unspecified: Secondary | ICD-10-CM | POA: Diagnosis not present

## 2021-08-30 DIAGNOSIS — D61818 Other pancytopenia: Secondary | ICD-10-CM | POA: Diagnosis present

## 2021-08-30 DIAGNOSIS — Z8673 Personal history of transient ischemic attack (TIA), and cerebral infarction without residual deficits: Secondary | ICD-10-CM

## 2021-08-30 DIAGNOSIS — Z955 Presence of coronary angioplasty implant and graft: Secondary | ICD-10-CM

## 2021-08-30 DIAGNOSIS — R079 Chest pain, unspecified: Secondary | ICD-10-CM | POA: Diagnosis not present

## 2021-08-30 DIAGNOSIS — Z79890 Hormone replacement therapy (postmenopausal): Secondary | ICD-10-CM | POA: Diagnosis not present

## 2021-08-30 DIAGNOSIS — I130 Hypertensive heart and chronic kidney disease with heart failure and stage 1 through stage 4 chronic kidney disease, or unspecified chronic kidney disease: Secondary | ICD-10-CM | POA: Diagnosis present

## 2021-08-30 DIAGNOSIS — I252 Old myocardial infarction: Secondary | ICD-10-CM

## 2021-08-30 DIAGNOSIS — Z888 Allergy status to other drugs, medicaments and biological substances status: Secondary | ICD-10-CM

## 2021-08-30 DIAGNOSIS — I5023 Acute on chronic systolic (congestive) heart failure: Secondary | ICD-10-CM | POA: Diagnosis not present

## 2021-08-30 DIAGNOSIS — Z809 Family history of malignant neoplasm, unspecified: Secondary | ICD-10-CM

## 2021-08-30 DIAGNOSIS — I714 Abdominal aortic aneurysm, without rupture, unspecified: Secondary | ICD-10-CM | POA: Diagnosis present

## 2021-08-30 DIAGNOSIS — Z85118 Personal history of other malignant neoplasm of bronchus and lung: Secondary | ICD-10-CM

## 2021-08-30 DIAGNOSIS — E039 Hypothyroidism, unspecified: Secondary | ICD-10-CM | POA: Diagnosis present

## 2021-08-30 DIAGNOSIS — K219 Gastro-esophageal reflux disease without esophagitis: Secondary | ICD-10-CM | POA: Diagnosis not present

## 2021-08-30 DIAGNOSIS — E785 Hyperlipidemia, unspecified: Secondary | ICD-10-CM | POA: Diagnosis present

## 2021-08-30 DIAGNOSIS — E119 Type 2 diabetes mellitus without complications: Secondary | ICD-10-CM

## 2021-08-30 DIAGNOSIS — Z923 Personal history of irradiation: Secondary | ICD-10-CM

## 2021-08-30 DIAGNOSIS — Z7984 Long term (current) use of oral hypoglycemic drugs: Secondary | ICD-10-CM | POA: Diagnosis not present

## 2021-08-30 DIAGNOSIS — E1122 Type 2 diabetes mellitus with diabetic chronic kidney disease: Secondary | ICD-10-CM | POA: Diagnosis present

## 2021-08-30 DIAGNOSIS — R9431 Abnormal electrocardiogram [ECG] [EKG]: Secondary | ICD-10-CM | POA: Diagnosis not present

## 2021-08-30 DIAGNOSIS — E1165 Type 2 diabetes mellitus with hyperglycemia: Secondary | ICD-10-CM | POA: Diagnosis present

## 2021-08-30 DIAGNOSIS — N1832 Chronic kidney disease, stage 3b: Secondary | ICD-10-CM | POA: Diagnosis not present

## 2021-08-30 DIAGNOSIS — G4733 Obstructive sleep apnea (adult) (pediatric): Secondary | ICD-10-CM | POA: Diagnosis not present

## 2021-08-30 LAB — CBC WITH DIFFERENTIAL/PLATELET
Abs Immature Granulocytes: 0.01 10*3/uL (ref 0.00–0.07)
Basophils Absolute: 0.1 10*3/uL (ref 0.0–0.1)
Basophils Relative: 2 %
Eosinophils Absolute: 0.1 10*3/uL (ref 0.0–0.5)
Eosinophils Relative: 2 %
HCT: 38.9 % — ABNORMAL LOW (ref 39.0–52.0)
Hemoglobin: 12.2 g/dL — ABNORMAL LOW (ref 13.0–17.0)
Immature Granulocytes: 0 %
Lymphocytes Relative: 24 %
Lymphs Abs: 0.8 10*3/uL (ref 0.7–4.0)
MCH: 24.3 pg — ABNORMAL LOW (ref 26.0–34.0)
MCHC: 31.4 g/dL (ref 30.0–36.0)
MCV: 77.5 fL — ABNORMAL LOW (ref 80.0–100.0)
Monocytes Absolute: 0.3 10*3/uL (ref 0.1–1.0)
Monocytes Relative: 9 %
Neutro Abs: 2.1 10*3/uL (ref 1.7–7.7)
Neutrophils Relative %: 63 %
Platelets: 247 10*3/uL (ref 150–400)
RBC: 5.02 MIL/uL (ref 4.22–5.81)
RDW: 16.3 % — ABNORMAL HIGH (ref 11.5–15.5)
WBC: 3.4 10*3/uL — ABNORMAL LOW (ref 4.0–10.5)
nRBC: 0 % (ref 0.0–0.2)

## 2021-08-30 LAB — TROPONIN I (HIGH SENSITIVITY)
Troponin I (High Sensitivity): 17 ng/L (ref <18)
Troponin I (High Sensitivity): 17 ng/L (ref <18)

## 2021-08-30 LAB — ECHOCARDIOGRAM COMPLETE
AR max vel: 1.11 cm2
AV Area VTI: 1.33 cm2
AV Area mean vel: 1.24 cm2
AV Mean grad: 2 mmHg
AV Peak grad: 5 mmHg
Ao pk vel: 1.12 m/s
Area-P 1/2: 5.42 cm2
Height: 71 in
S' Lateral: 4.1 cm
Weight: 2608 oz

## 2021-08-30 LAB — BASIC METABOLIC PANEL
Anion gap: 9 (ref 5–15)
BUN: 23 mg/dL (ref 8–23)
CO2: 21 mmol/L — ABNORMAL LOW (ref 22–32)
Calcium: 8.4 mg/dL — ABNORMAL LOW (ref 8.9–10.3)
Chloride: 109 mmol/L (ref 98–111)
Creatinine, Ser: 1.33 mg/dL — ABNORMAL HIGH (ref 0.61–1.24)
GFR, Estimated: 57 mL/min — ABNORMAL LOW (ref >60)
Glucose, Bld: 250 mg/dL — ABNORMAL HIGH (ref 70–99)
Potassium: 4.5 mmol/L (ref 3.5–5.1)
Sodium: 139 mmol/L (ref 135–145)

## 2021-08-30 LAB — CBG MONITORING, ED
Glucose-Capillary: 226 mg/dL — ABNORMAL HIGH (ref 70–99)
Glucose-Capillary: 203 mg/dL — ABNORMAL HIGH (ref 70–99)

## 2021-08-30 LAB — BRAIN NATRIURETIC PEPTIDE: B Natriuretic Peptide: 1424.1 pg/mL — ABNORMAL HIGH (ref 0.0–100.0)

## 2021-08-30 LAB — RESP PANEL BY RT-PCR (FLU A&B, COVID) ARPGX2
Influenza A by PCR: NEGATIVE
Influenza B by PCR: NEGATIVE
SARS Coronavirus 2 by RT PCR: NEGATIVE

## 2021-08-30 MED ORDER — METOPROLOL SUCCINATE ER 100 MG PO TB24
100.0000 mg | ORAL_TABLET | Freq: Every day | ORAL | Status: DC
  Administered 2021-08-30 – 2021-09-03 (×5): 100 mg via ORAL
  Filled 2021-08-30: qty 1
  Filled 2021-08-30: qty 4
  Filled 2021-08-30: qty 1
  Filled 2021-08-30: qty 4
  Filled 2021-08-30: qty 1

## 2021-08-30 MED ORDER — SIMVASTATIN 20 MG PO TABS
20.0000 mg | ORAL_TABLET | Freq: Every evening | ORAL | Status: DC
  Administered 2021-08-30 – 2021-09-02 (×4): 20 mg via ORAL
  Filled 2021-08-30 (×4): qty 1

## 2021-08-30 MED ORDER — AMITRIPTYLINE HCL 25 MG PO TABS
25.0000 mg | ORAL_TABLET | Freq: Every day | ORAL | Status: DC
  Administered 2021-08-31 – 2021-09-02 (×3): 25 mg via ORAL
  Filled 2021-08-30 (×6): qty 1

## 2021-08-30 MED ORDER — INSULIN DETEMIR 100 UNIT/ML ~~LOC~~ SOLN
15.0000 [IU] | Freq: Two times a day (BID) | SUBCUTANEOUS | Status: DC
  Administered 2021-08-30 – 2021-09-01 (×4): 15 [IU] via SUBCUTANEOUS
  Filled 2021-08-30 (×5): qty 0.15

## 2021-08-30 MED ORDER — INSULIN ASPART 100 UNIT/ML IJ SOLN
0.0000 [IU] | Freq: Three times a day (TID) | INTRAMUSCULAR | Status: DC
  Administered 2021-08-31: 2 [IU] via SUBCUTANEOUS
  Administered 2021-08-31: 5 [IU] via SUBCUTANEOUS
  Administered 2021-09-01 (×2): 3 [IU] via SUBCUTANEOUS
  Administered 2021-09-02 – 2021-09-03 (×4): 5 [IU] via SUBCUTANEOUS

## 2021-08-30 MED ORDER — ZOLPIDEM TARTRATE 5 MG PO TABS
2.5000 mg | ORAL_TABLET | Freq: Every day | ORAL | Status: DC
  Administered 2021-08-30 – 2021-09-02 (×4): 2.5 mg via ORAL
  Filled 2021-08-30 (×4): qty 1

## 2021-08-30 MED ORDER — ENOXAPARIN SODIUM 40 MG/0.4ML IJ SOSY
40.0000 mg | PREFILLED_SYRINGE | INTRAMUSCULAR | Status: DC
  Administered 2021-08-30 – 2021-09-02 (×4): 40 mg via SUBCUTANEOUS
  Filled 2021-08-30 (×4): qty 0.4

## 2021-08-30 MED ORDER — ASPIRIN EC 81 MG PO TBEC
81.0000 mg | DELAYED_RELEASE_TABLET | Freq: Every day | ORAL | Status: DC
  Administered 2021-08-30 – 2021-09-03 (×5): 81 mg via ORAL
  Filled 2021-08-30 (×5): qty 1

## 2021-08-30 MED ORDER — IVABRADINE HCL 5 MG PO TABS
5.0000 mg | ORAL_TABLET | Freq: Two times a day (BID) | ORAL | Status: DC
  Administered 2021-08-31 – 2021-09-01 (×3): 5 mg via ORAL
  Filled 2021-08-30 (×5): qty 1

## 2021-08-30 MED ORDER — LEVOTHYROXINE SODIUM 50 MCG PO TABS
50.0000 ug | ORAL_TABLET | Freq: Every day | ORAL | Status: DC
  Administered 2021-08-31 – 2021-09-03 (×4): 50 ug via ORAL
  Filled 2021-08-30 (×3): qty 1
  Filled 2021-08-30: qty 2

## 2021-08-30 MED ORDER — LOSARTAN POTASSIUM 25 MG PO TABS
25.0000 mg | ORAL_TABLET | Freq: Every day | ORAL | Status: DC
  Administered 2021-08-30 – 2021-09-01 (×3): 25 mg via ORAL
  Filled 2021-08-30 (×3): qty 1

## 2021-08-30 MED ORDER — OMEGA-3-ACID ETHYL ESTERS 1 G PO CAPS
1.0000 g | ORAL_CAPSULE | Freq: Every day | ORAL | Status: DC
  Administered 2021-08-31 – 2021-09-03 (×5): 1 g via ORAL
  Filled 2021-08-30 (×7): qty 1

## 2021-08-30 MED ORDER — FUROSEMIDE 10 MG/ML IJ SOLN
40.0000 mg | Freq: Two times a day (BID) | INTRAMUSCULAR | Status: DC
  Administered 2021-08-30 – 2021-09-02 (×6): 40 mg via INTRAVENOUS
  Filled 2021-08-30 (×6): qty 4

## 2021-08-30 MED ORDER — ALBUTEROL SULFATE HFA 108 (90 BASE) MCG/ACT IN AERS
2.0000 | INHALATION_SPRAY | RESPIRATORY_TRACT | Status: DC | PRN
  Filled 2021-08-30: qty 6.7

## 2021-08-30 NOTE — Progress Notes (Deleted)
° °  Subjective:    Patient ID: [name], male    DOB: 11/25/1950, 72 y.o.   MRN: [id number]  HPI    Review of Systems     Objective:   Physical Exam        Assessment & Plan:

## 2021-08-30 NOTE — ED Provider Triage Note (Signed)
Emergency Medicine Provider Triage Evaluation Note  [name] , a 71 y.o. male  was evaluated in triage.  Pt complains of shortness of breath for several months.  Patient notes he was at his cardiologist, Dr. Acie Fredrickson this morning going for a catheterization when he became short of breath and was recommended to come to the ED for further evaluation.  He notes that he has shortness of breath worse with exertion.  He has not tried medications for his symptoms.  Denies chest pain, abdominal pain, nausea, vomiting.  Patient has a history of heart failure is treated with Lasix.  Review of Systems  Positive: Shortness of breath Negative: Chest pain  Physical Exam  BP 115/72 (BP Location: Left Arm)    Pulse 94    Temp (!) 97.4 F (36.3 C) (Oral)    Resp 20    Ht 5\' 11"  (1.803 m)    Wt 72.6 kg    SpO2 100%    BMI 22.32 kg/m  Gen:   Awake, no distress   Resp:  Normal effort, decreased breath sounds noted in left lung field MSK:   Moves extremities without difficulty   Medical Decision Making  Medically screening exam initiated at 12:14 PM.  Appropriate orders placed.  [name] was informed that the remainder of the evaluation will be completed by another provider, this initial triage assessment does not replace that evaluation, and the importance of remaining in the ED until their evaluation is complete.  12:18 PM - Discussed with RN that patient is in need of a room immediately. RN aware and working on room placement.    Shamiah Kahler A, PA-C 08/30/21 1219

## 2021-08-30 NOTE — ED Triage Notes (Signed)
PT sent over for the cardiology clinic to have fluid taken off. Pt states he went there for a stress test but is having SHOB so they sent him here.

## 2021-08-30 NOTE — ED Provider Notes (Signed)
[facility] EMERGENCY DEPARTMENT Provider Note   CSN: [id number] Arrival date & time: 08/30/21  1129     History  Chief Complaint  Patient presents with   Shortness of Breath    [name] is a 71 y.o. male.  The history is provided by the patient and medical records. No language interpreter was used.  Shortness of Breath Severity:  Severe Onset quality:  Gradual Duration:  2 weeks Timing:  Intermittent Progression:  Waxing and waning Chronicity:  New Relieved by:  Nothing Worsened by:  Exertion Ineffective treatments:  None tried Associated symptoms: no abdominal pain, no chest pain, no cough, no diaphoresis, no fever, no headaches, no sputum production, no vomiting and no wheezing       Home Medications Prior to Admission medications   Medication Sig Start Date End Date Taking? Authorizing Provider  amitriptyline (ELAVIL) 25 MG tablet Take 25 mg by mouth at bedtime.     [provider]  amoxicillin (AMOXIL) 500 MG capsule TAKE 4 CAPSULES BY MOUTH 1 HOUR PRIOR TO DENTAL PROCEDURES 05/27/20   Eileen Stanford, PA-C  B Complex-C (SUPER B COMPLEX PO) Take 1 tablet by mouth daily.    [provider]  B-D ULTRAFINE III SHORT PEN 31G X 8 MM MISC 1 each by Other route daily as needed (GLUCOSE TESTING (INSULINE NEEDLE)).  04/22/13   [provider]  furosemide (LASIX) 20 MG tablet Take 2 tablets (40 mg total) by mouth daily. 08/18/21   Elgie Collard, PA-C  insulin detemir (LEVEMIR) 100 UNIT/ML injection Inject 10-18 Units into the skin See admin instructions. 18 units in the morning, 10 units at bedtime    [provider]  insulin lispro (HUMALOG) 100 UNIT/ML KwikPen Inject 2-3 Units into the skin See admin instructions. Per sliding scale at Breakfast and Dinner 03/26/19   [provider]  ivabradine (CORLANOR) 5 MG TABS tablet Take 1 tablet (5 mg total) by mouth 2 (two) times daily. Please keep upcoming appointment  for future refills. Thank you 07/19/21   [name], MD  levothyroxine (SYNTHROID, LEVOTHROID) 50 MCG tablet Take 50 mcg by mouth daily before breakfast.     [provider]  losartan (COZAAR) 25 MG tablet TAKE 1 TABLET(25 MG) BY MOUTH DAILY 02/22/21   [name], MD  metFORMIN (GLUCOPHAGE) 500 MG tablet Take 2 tablets (1,000 mg total) by mouth 2 (two) times daily with a meal. 05/17/18   Eileen Stanford, PA-C  metoprolol succinate (TOPROL-XL) 100 MG 24 hr tablet Take 1 tablet (100 mg total) by mouth daily. Take with or immediately following a meal. 08/04/21   Turner, Eber Hong, MD  Multiple Vitamin (MULTIVITAMIN) tablet Take 1 tablet by mouth daily.    [provider]  Omega-3 Fatty Acids (FISH OIL PO) Take by mouth.    [provider]  ONE TOUCH ULTRA TEST test strip 1 each by Other route as needed for other.  01/15/13   [provider]  simvastatin (ZOCOR) 20 MG tablet Take 20 mg by mouth every evening.     [provider]  simvastatin (ZOCOR) 40 MG tablet Take 20 mg by mouth daily. 07/29/21   [provider]  zolpidem (AMBIEN) 5 MG tablet Take 2.5 mg by mouth at bedtime. 12/26/19   [provider]      Allergies    Atorvastatin and Adhesive [tape]    Review of Systems   Review of Systems  Constitutional:  Positive for fatigue. Negative for chills, diaphoresis and fever.  HENT:  Negative for congestion.   Respiratory:  Positive for chest tightness and shortness of breath. Negative for cough, sputum production and wheezing.   Cardiovascular:  Positive for leg swelling. Negative for chest pain and palpitations.  Gastrointestinal:  Negative for abdominal pain, constipation, diarrhea, nausea and vomiting.  Genitourinary:  Negative for flank pain.  Musculoskeletal:  Negative for back pain.  Neurological:  Negative for dizziness, light-headedness and headaches.  Psychiatric/Behavioral:  Negative for agitation.   All other  systems reviewed and are negative.  Physical Exam Updated Vital Signs BP 126/87 (BP Location: Left Arm)    Pulse 99    Temp 98.6 F (37 C) (Oral)    Resp 16    Ht 5\' 11"  (1.803 m)    Wt 72.6 kg    SpO2 98%    BMI 22.32 kg/m  Physical Exam Vitals and nursing note reviewed.  Constitutional:      General: He is not in acute distress.    Appearance: He is well-developed. He is not ill-appearing, toxic-appearing or diaphoretic.  HENT:     Head: Normocephalic and atraumatic.  Eyes:     Conjunctiva/sclera: Conjunctivae normal.     Pupils: Pupils are equal, round, and reactive to light.  Cardiovascular:     Rate and Rhythm: Normal rate and regular rhythm.     Heart sounds: Murmur heard.  Pulmonary:     Effort: Pulmonary effort is normal. No respiratory distress.     Breath sounds: Rales present. No rhonchi.  Chest:     Chest wall: No tenderness.  Abdominal:     Palpations: Abdomen is soft.     Tenderness: There is no abdominal tenderness.  Musculoskeletal:        General: No swelling.     Cervical back: Neck supple.     Right lower leg: No tenderness. Edema present.     Left lower leg: No tenderness. Edema present.  Skin:    General: Skin is warm and dry.     Capillary Refill: Capillary refill takes less than 2 seconds.     Findings: No erythema.  Neurological:     Mental Status: He is alert.  Psychiatric:        Mood and Affect: Mood normal.    ED Results / Procedures / Treatments   Labs (all labs ordered are listed, but only abnormal results are displayed) Labs Reviewed  BASIC METABOLIC PANEL - Abnormal; Notable for the following components:      Result Value   CO2 21 (*)    Glucose, Bld 250 (*)    Creatinine, Ser 1.33 (*)    Calcium 8.4 (*)    GFR, Estimated 57 (*)    All other components within normal limits  CBC WITH DIFFERENTIAL/PLATELET - Abnormal; Notable for the following components:   WBC 3.4 (*)    Hemoglobin 12.2 (*)    HCT 38.9 (*)    MCV 77.5 (*)     MCH 24.3 (*)    RDW 16.3 (*)    All other components within normal limits  BRAIN NATRIURETIC PEPTIDE - Abnormal; Notable for the following components:   B Natriuretic Peptide 1,424.1 (*)    All other components within normal limits  CBG MONITORING, ED - Abnormal; Notable for the following components:   Glucose-Capillary 226 (*)    All other components within normal limits  RESP PANEL BY RT-PCR (FLU A&B, COVID) [id number]  TROPONIN I (HIGH SENSITIVITY)  TROPONIN I (HIGH SENSITIVITY)    EKG EKG Interpretation  Date/Time:  Monday August 30 2021 12:15:14 EST Ventricular Rate:  94 PR Interval:  186 QRS Duration: 150 QT Interval:  420 QTC Calculation: 525 R Axis:   83 Text Interpretation: Normal sinus rhythm Left bundle branch block Abnormal ECG When compared with ECG of 12-Feb-2020 05:12, PREVIOUS ECG IS PRESENT since last tracing no significant change Confirmed by Malvin [name] [phone]) on 08/30/2021 12:21:04 PM  Radiology DG Chest 2 View  Result Date: 08/30/2021 CLINICAL DATA:  Chest pain. Shortness of breath. History of congestive heart failure, hypertension and diabetes. EXAM: CHEST - 2 VIEW COMPARISON:  Radiographs 02/12/2020.  CT 05/05/2021. FINDINGS: Left subclavian AICD leads appear unchanged. The heart size and mediastinal contours are stable status post median sternotomy, CABG and TAVR. There are new small bilateral pleural effusions with associated bibasilar atelectasis superimposed on chronic left lower lobe scarring. Mild vascular congestion is present without overt pulmonary edema. There is no pneumothorax or confluent airspace opacity. The bones appear unchanged. IMPRESSION: New small bilateral pleural effusions with associated bibasilar atelectasis and vascular congestion. No overt pulmonary edema. Electronically Signed   By: Richardean Sale M.D.   On: 08/30/2021 12:35   ECHOCARDIOGRAM COMPLETE  Result Date: 08/30/2021    ECHOCARDIOGRAM REPORT   Patient Name:   [name]  Date of Exam: 08/30/2021 Medical Rec #:  [id number]     Height:       71.0 in Accession #:    [id number]    Weight:       163.0 lb Date of Birth:  08-Aug-1951    BSA:          1.933 m Patient Age:    71 years      BP:           127/87 mmHg Patient Gender: M             HR:           94 bpm. Exam Location:  [facility] Procedure: 2D Echo, 3D Echo, Cardiac Doppler and Color Doppler Indications:    R06.02 SOB  History:        Patient has prior history of Echocardiogram examinations, most                 recent 12/23/2019. CHF, CAD; Risk Factors:Hypertension and HLD.                 Aortic Valve: 29 mm Edwards Sapien prosthetic, stented (TAVR)                 valve is present in the aortic position. Procedure Date:                 05/15/2018.  Sonographer:    Marygrace Drought RCS Referring Phys: [facility]  1. Left ventricular ejection fraction, by estimation, is 20 to 25%. The left ventricle has severely decreased function. The left ventricle demonstrates global hypokinesis. There is mild left ventricular hypertrophy. Left ventricular diastolic parameters  are indeterminate.  2. Right ventricular systolic function is mildly reduced. The right ventricular size is moderately enlarged. There is moderately elevated pulmonary artery systolic pressure. The estimated right ventricular systolic pressure is 32.9 mmHg.  3. Right atrial size was mildly dilated.  4. Large pleural effusion in the left lateral region.  5. The mitral valve is normal in structure. Moderate mitral valve regurgitation. No evidence of mitral  stenosis.  6. Tricuspid valve regurgitation is moderate.  7. There is a 29 mm Edwards Sapien prosthetic (TAVR) valve present in the aortic position     Aortic valve regurgitation is not visualized. Aortic valve mean gradient measures 2.0 mmHg.  8. Aortic dilatation noted. There is mild dilatation of the ascending aorta, measuring 38 mm.  9. The inferior vena cava is dilated in size with <50%  respiratory variability, suggesting right atrial pressure of 15 mmHg. FINDINGS  Left Ventricle: Left ventricular ejection fraction, by estimation, is 20 to 25%. The left ventricle has severely decreased function. The left ventricle demonstrates global hypokinesis. The left ventricular internal cavity size was normal in size. There is mild left ventricular hypertrophy. Left ventricular diastolic parameters are indeterminate. Right Ventricle: The right ventricular size is moderately enlarged. Right vetricular wall thickness was not well visualized. Right ventricular systolic function is mildly reduced. There is moderately elevated pulmonary artery systolic pressure. The tricuspid regurgitant velocity is 2.98 m/s, and with an assumed right atrial pressure of 15 mmHg, the estimated right ventricular systolic pressure is 17.6 mmHg. Left Atrium: Left atrial size was normal in size. Right Atrium: Right atrial size was mildly dilated. Pericardium: Trivial pericardial effusion is present. Mitral Valve: The mitral valve is normal in structure. Moderate mitral valve regurgitation. No evidence of mitral valve stenosis. Tricuspid Valve: The tricuspid valve is normal in structure. Tricuspid valve regurgitation is moderate. Aortic Valve: The aortic valve has been repaired/replaced. Aortic valve regurgitation is not visualized. Aortic valve mean gradient measures 2.0 mmHg. Aortic valve peak gradient measures 5.0 mmHg. Aortic valve area, by VTI measures 1.33 cm. There is a 29 mm Edwards Sapien prosthetic, stented (TAVR) valve present in the aortic position. Procedure Date: 05/15/2018. Pulmonic Valve: The pulmonic valve was not well visualized. Pulmonic valve regurgitation is not visualized. Aorta: The aortic root is normal in size and structure and aortic dilatation noted. There is mild dilatation of the ascending aorta, measuring 38 mm. Venous: The inferior vena cava is dilated in size with less than 50% respiratory variability,  suggesting right atrial pressure of 15 mmHg. IAS/Shunts: The interatrial septum was not well visualized. Additional Comments: There is a large pleural effusion in the left lateral region.  LEFT VENTRICLE PLAX 2D LVIDd:         4.40 cm   Diastology LVIDs:         4.10 cm   LV e' medial:    5.44 cm/s LV PW:         1.30 cm   LV E/e' medial:  19.9 LV IVS:        1.10 cm   LV e' lateral:   12.20 cm/s LVOT diam:     1.80 cm   LV E/e' lateral: 8.9 LV SV:         22 LV SV Index:   11 LVOT Area:     2.54 cm                           3D Volume EF:                          3D EF:        29 %                          LV EDV:       198 ml  LV ESV:       141 ml                          LV SV:        57 ml RIGHT VENTRICLE RV Basal diam:  5.60 cm RV Mid diam:    4.80 cm RV S prime:     10.90 cm/s TAPSE (M-mode): 1.7 cm RVSP:           43.5 mmHg LEFT ATRIUM             Index        RIGHT ATRIUM           Index LA diam:        4.70 cm 2.43 cm/m   RA Pressure: 8.00 mmHg LA Vol (A2C):   87.1 ml 45.06 ml/m  RA Area:     20.40 cm LA Vol (A4C):   33.5 ml 17.33 ml/m  RA Volume:   62.00 ml  32.07 ml/m LA Biplane Vol: 56.4 ml 29.18 ml/m  AORTIC VALVE AV Area (Vmax):    1.11 cm AV Area (Vmean):   1.24 cm AV Area (VTI):     1.33 cm AV Vmax:           112.00 cm/s AV Vmean:          70.900 cm/s AV VTI:            0.166 m AV Peak Grad:      5.0 mmHg AV Mean Grad:      2.0 mmHg LVOT Vmax:         48.70 cm/s LVOT Vmean:        34.450 cm/s LVOT VTI:          0.087 m LVOT/AV VTI ratio: 0.52  AORTA Ao Root diam: 3.20 cm Ao Asc diam:  3.80 cm MITRAL VALVE                TRICUSPID VALVE MV Area (PHT):              TR Peak grad:   35.5 mmHg MV Decel Time:              TR Vmax:        298.00 cm/s MV E velocity: 108.00 cm/s  Estimated RAP:  8.00 mmHg                             RVSP:           43.5 mmHg                              SHUNTS                             Systemic VTI:  0.09 m                              Systemic Diam: 1.80 cm Oswaldo Milian MD Electronically signed by Oswaldo Milian MD Signature Date/Time: 08/30/2021/1:03:43 PM    Final     Procedures Procedures    Medications Ordered in ED Medications  albuterol (VENTOLIN HFA) 108 (90 Base) MCG/ACT inhaler 2 puff (has no administration in time range)  furosemide (LASIX) injection 40 mg (40  mg Intravenous Given 08/30/21 1819)    ED Course/ Medical Decision Making/ A&P                           Medical Decision Making  JOHATHON OVERTURF is a 71 y.o. male with a past medical history significant for hypertension, dyslipidemia, diabetes, CAD, CHF, AAA, stroke, previous TAVR, and GERD who presents from cardiology office for admission and diuresis for worsening shortness of breath.  According to documentation, patient went to cardiology today for stress test but was so short of breath, he was sent over for primary cardiology admission and further management.  Per note of cardiology, patient had an echo that showed severe dysfunction with a large pleural effusion.  He was short of breath with worsened leg edema.  He reportedly cannot walk more than 10 feet without getting winded and had an EF of less than 20% previously.  They feel he needs admission for diuresis and possible thoracentesis.  Patient says that for the last few weeks he has had worsening shortness of breath acutely worsening this week.  He reports he is getting more edematous in his legs despite taking his home diuretics.  He reports no chest pain or palpitations and denies fevers, chills congestion, or cough.  Denies nausea, vomiting, constipation, diarrhea, or urinary changes.  On my exam, vital signs are reassuring and he is maintaining oxygen saturations on room air while at rest but he reports that symptoms all began when he walks.  Patient had some work-up started in triage as he has been waiting approximately 5 hours prior to getting seen.  His troponin levels here are  stable at 17 and 17 respectively but BNP is much higher at 1400 up from 700 previously.  Chest x-ray shows pleural effusions.  Will call cardiology to facilitate his admission.  Will discuss with them what type of diuresis they want [place] to begin for him.   6:10 PM Cardiology will start diuresis and they want medicine to admit with them following.  We will call medicine for admission.        Final Clinical Impression(s) / ED Diagnoses Final diagnoses:  SOB (shortness of breath)  Hypervolemia, unspecified hypervolemia type    Clinical Impression: 1. SOB (shortness of breath)   2. Hypervolemia, unspecified hypervolemia type     Disposition: Admit  This note was prepared with assistance of Dragon voice recognition software. Occasional wrong-word or sound-a-like substitutions may have occurred due to the inherent limitations of voice recognition software.      Giorgio Chabot, Gwenyth Allegra, MD 08/31/21 [phone]

## 2021-08-30 NOTE — ED Notes (Signed)
Patient requesting sugar check. Has not eaten since early morning. Sugar 226. IV in hand- placed by staff for stress test this morning- became SOB and did not complete text. HF patient that takes furosemide-reports no recent changes to meds. Swelling in legs has been worse over past 2 weeks. No pain at this time. Reports breathing has improved since sitting out in lobby.

## 2021-08-30 NOTE — Progress Notes (Unsigned)
° ° °  I was asked to see this patient in nuclear medicine who was very short of breath.  The patient just had an echocardiogram which revealed severe left ventricular dysfunction.  He has an extremely large left pleural effusion.  He is very short of breath.  He has leg edema.  He is unable to walk more than 10 feet without becoming very short of breath.  He has a history of severe left ventricular dysfunction with an EF of less than 20% by echocardiogram in May, 2021.  Enlarged left pleural effusion appears to be new.  Lungs: Reduced breath sounds left lung field Cor: + S3 gallop, + systolic murmur  Legs - 2+ pitting edema    We will send him to the [facility] emergency room for further evaluation and management.  He will need to be admitted by the primary team.  He will likely need a thoracentesis.    Mertie Moores, MD  08/30/2021 10:49 AM    [address],  [place],   [address] Phone: [phone]; Fax: [phone]

## 2021-08-30 NOTE — H&P (Signed)
History and Physical    [name] FHL:[id number] DOB: [birth year] DOA: 08/30/2021  PCP: [name], MD  Patient coming from: nuclear medicine  I have personally briefly reviewed patient's old medical records in [place]  Chief Complaint: increasing shortness of breath  HPI: [name] is a 71 y.o. male with medical history significant for CAD s/p CABG, chronic systolic heart failure with EF 20% s/p AICD, AAS s/p TAVR, remote hx of squamous cell carcinoma of the LLL s/p radiation, CKD, type 2 diabetes, AAA, hypertension, CVA, and OSA not on CPAPwho presents for evaluation of shortness of breath at the instruction of cardiology.  He has been having shortness of breath with exertion for at least a month with increase LE edema. Has had his 20mg  Lasix daily increased at times with minimal improvement .  He was sent for stress test today and seen by cardiology in nuclear medicine since he was not able to walk more than 10 feet without becoming very short of breath. Had echocardiogram today showing EF of 20-25% with severe decreased LV function. Large left sided pleural effusion.Sent to ED for admission and diuresis.    ED Course: He was afebrile, normotensive on room air.  BNP of 1000 124.  Troponin stable at 17x2.  EKG on my review with LBBB similar to prior. WBC of 3.4, hemoglobin 12.2. Sodium 139, K4.5, CO2 21, BG of 250, creatinine of 1.33 from baseline around 1.2.  Chest x-ray showed bilateral small pleural effusion.  Review of Systems: Constitutional: No Weight Change Cardiovascular: No Chest Pain, no SOB, No PND, + Dyspnea on Exertion, No Orthopnea, + Edema, No Palpitations Respiratory: No Cough, No Sputum Gastrointestinal: No Nausea, No Vomiting, No Diarrhea Genitourinary: no Urinary Incontinence Musculoskeletal: No Arthralgias, No Myalgias Skin: No Skin Lesions, No Pruritus, Neuro: no Weakness, No Numbness Psych: No Anxiety/Panic, No Depression, no decrease  appetite Heme/Lymph: No Bruising, No Bleeding  Social Patient works as a Presenter, broadcasting at [employer].  Former tobacco use but quit in 1986.  No current alcohol or illicit drug use.  Past Medical History:  Diagnosis Date   AICD (automatic cardioverter/defibrillator) present 02/11/2020   Anemia    Arthritis    Atrial tachycardia (HCC)    Chronic systolic CHF (congestive heart failure) ([place]) 07/04/2018   CKD (chronic kidney disease), stage III ([place])    Coronary artery disease 02/2012   a. s/p stenting in 2013  b. s/p CABGx2V (SVG--> PDA, SVG--> LCx).   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  Intolerant to PAP therapy   S/P TAVR (transcatheter aortic valve replacement) 05/15/2018   29 mm Edwards Sapien 3 transcatheter heart valve placed via percutaneous right transfemoral approach    Severe aortic stenosis     Past Surgical History:  Procedure Laterality Date   BIV ICD INSERTION CRT-D N/A 02/11/2020   Procedure:  BIV ICD INSERTION CRT-D;  Surgeon: Thompson Grayer, MD;  Location: [facility];  Service: Cardiovascular;  Laterality: N/A;   COLONOSCOPY  07/25/2012   Procedure: COLONOSCOPY;  Surgeon: Winfield Cunas., MD;  Location: [facility] ENDOSCOPY;  Service: Endoscopy;  Laterality: N/A;   COLONOSCOPY N/A 12/02/2013   Procedure: COLONOSCOPY;  Surgeon: Winfield Cunas., MD;  Location: [facility] ENDOSCOPY;  Service: Endoscopy;  Laterality:  N/A;   CORONARY ARTERY BYPASS GRAFT N/A 01/04/2013   Procedure: CORONARY ARTERY BYPASS GRAFTING (CABG) times two using right saphenous vein harvested with endoscope.;  Surgeon: Ivin Poot, MD;  Location: [facility];  Service: Open Heart Surgery;  Laterality: N/A;   ESOPHAGOGASTRODUODENOSCOPY  07/20/2012   Procedure: ESOPHAGOGASTRODUODENOSCOPY (EGD);  Surgeon: Winfield Cunas., MD;  Location: [facility]OSCOPY;  Service: Endoscopy;  Laterality: N/A;   ESOPHAGOGASTRODUODENOSCOPY N/A 12/02/2013   Procedure: ESOPHAGOGASTRODUODENOSCOPY (EGD);  Surgeon: Winfield Cunas., MD;  Location: [facility];  Service: Endoscopy;  Laterality: N/A;   HOT HEMOSTASIS N/A 12/02/2013   Procedure: HOT HEMOSTASIS (ARGON PLASMA COAGULATION/BICAP);  Surgeon: Winfield Cunas., MD;  Location: [facility];  Service: Endoscopy;  Laterality: N/A;   INTRAOPERATIVE TRANSESOPHAGEAL ECHOCARDIOGRAM N/A 01/04/2013   Procedure: INTRAOPERATIVE TRANSESOPHAGEAL ECHOCARDIOGRAM;  Surgeon: Ivin Poot, MD;  Location: [facility];  Service: Open Heart Surgery;  Laterality: N/A;   INTRAOPERATIVE TRANSTHORACIC ECHOCARDIOGRAM N/A 05/15/2018   Procedure: INTRAOPERATIVE TRANSTHORACIC ECHOCARDIOGRAM;  Surgeon: Burnell Blanks, MD;  Location: [facility];  Service: Open Heart Surgery;  Laterality: N/A;   IR THORACENTESIS ASP PLEURAL SPACE W/IMG GUIDE  10/19/2018   LEFT HEART CATHETERIZATION WITH CORONARY ANGIOGRAM N/A 03/06/2012   Procedure: LEFT HEART CATHETERIZATION WITH CORONARY ANGIOGRAM;  Surgeon: [name], MD;  Location: [facility];  Service: Cardiovascular;  Laterality: N/A;   LITHOTRIPSY     [place] Left 01/04/2013   Procedure: RADIAL ARTERY HARVEST;  Surgeon: Ivin Poot, MD;  Location: [facility];  Service: Vascular;  Laterality: Left;  Artery not havested. Unsuitable for use.   resection mediastinal seminonma     RIGHT/LEFT HEART CATH AND CORONARY/GRAFT ANGIOGRAPHY N/A 11/15/2016   Procedure:  Right/Left Heart Cath and Coronary/Graft Angiography;  Surgeon: Leonie Man, MD;  Location: [facility];  Service: Cardiovascular;  Laterality: N/A;   RIGHT/LEFT HEART CATH AND CORONARY/GRAFT ANGIOGRAPHY N/A 04/13/2018   Procedure: RIGHT/LEFT HEART CATH AND CORONARY/GRAFT ANGIOGRAPHY;  Surgeon: Jolaine Artist, MD;  Location: [facility];  Service: Cardiovascular;  Laterality: N/A;   TRANSCATHETER AORTIC VALVE REPLACEMENT, TRANSFEMORAL  05/15/2018   TRANSCATHETER AORTIC VALVE REPLACEMENT, TRANSFEMORAL N/A 05/15/2018   Procedure: TRANSCATHETER AORTIC VALVE REPLACEMENT, TRANSFEMORAL;  Surgeon: Burnell Blanks, MD;  Location: [facility];  Service: Open Heart Surgery;  Laterality: N/A;     reports that he quit smoking about 36 years ago. His smoking use included cigarettes. He has never used smokeless tobacco. He reports that he does not drink alcohol and does not use drugs. Social History  Allergies  Allergen Reactions   Atorvastatin Other (See Comments)    Muscle pain Other reaction(s): cramps   Adhesive [Tape] Rash    Family History  Problem Relation Age of Onset   Diabetes Father    Diabetes Mother    Heart failure Mother    Hypertension Mother    Cancer Brother    Diabetes Sister      Prior to Admission medications   Medication Sig  Start Date End Date Taking? Authorizing Provider  amitriptyline (ELAVIL) 25 MG tablet Take 25 mg by mouth at bedtime.    Yes [provider]  B Complex-C (SUPER B COMPLEX PO) Take 1 tablet by mouth daily.   Yes [provider]  furosemide (LASIX) 20 MG tablet Take 2 tablets (40 mg total) by mouth daily. 08/18/21  Yes Conte, Tessa N, PA-C  insulin detemir (LEVEMIR) 100 UNIT/ML injection Inject 10-18 Units into the skin See admin instructions. 18 units in the morning, 10 units at bedtime   Yes [provider]  insulin lispro (HUMALOG) 100 UNIT/ML KwikPen Inject 2-3 Units into the skin See admin  instructions. Per sliding scale at Breakfast and Dinner 03/26/19  Yes [provider]  levothyroxine (SYNTHROID, LEVOTHROID) 50 MCG tablet Take 50 mcg by mouth daily before breakfast.    Yes [provider]  losartan (COZAAR) 25 MG tablet TAKE 1 TABLET(25 MG) BY MOUTH DAILY Patient taking differently: Take 25 mg by mouth daily. 02/22/21  Yes [name], MD  metFORMIN (GLUCOPHAGE) 500 MG tablet Take 2 tablets (1,000 mg total) by mouth 2 (two) times daily with a meal. 05/17/18  Yes Eileen Stanford, PA-C  metoprolol succinate (TOPROL-XL) 100 MG 24 hr tablet Take 1 tablet (100 mg total) by mouth daily. Take with or immediately following a meal. Patient taking differently: Take 100 mg by mouth daily. 08/04/21  Yes Turner, Eber Hong, MD  Multiple Vitamin (MULTIVITAMIN) tablet Take 1 tablet by mouth daily.   Yes [provider]  Omega-3 Fatty Acids (FISH OIL PO) Take 1 tablet by mouth daily.   Yes [provider]  simvastatin (ZOCOR) 20 MG tablet Take 20 mg by mouth every evening.    Yes [provider]  zolpidem (AMBIEN) 5 MG tablet Take 2.5 mg by mouth at bedtime. 12/26/19  Yes [provider]  amoxicillin (AMOXIL) 500 MG capsule TAKE 4 CAPSULES BY MOUTH 1 HOUR PRIOR TO DENTAL PROCEDURES Patient not taking: Reported on 08/30/2021 05/27/20   Eileen Stanford, PA-C  B-D ULTRAFINE III SHORT PEN 31G X 8 MM MISC 1 each by Other route daily as needed (GLUCOSE TESTING (INSULINE NEEDLE)).  04/22/13   [provider]  ivabradine (CORLANOR) 5 MG TABS tablet Take 1 tablet (5 mg total) by mouth 2 (two) times daily. Please keep upcoming appointment for future refills. Thank you Patient not taking: Reported on 08/30/2021 07/19/21   [name], MD  ONE TOUCH ULTRA TEST test strip 1 each by Other route as needed for other.  01/15/13   [provider]    Physical Exam: Vitals:   08/30/21 1630 08/30/21 1700 08/30/21 1800 08/30/21 1837  BP:  125/85 (!) 131/94 129/84 118/86  Pulse: 98 (!) 101 99 78  Resp: (!) 31 (!) 36 (!) 32 (!) 23  Temp:      TempSrc:      SpO2: 100% 98% 98% 94%  Weight:      Height:        Constitutional: NAD, calm, comfortable,thin cachetic appearing male sitting upright in bed Vitals:   08/30/21 1630 08/30/21 1700 08/30/21 1800 08/30/21 1837  BP: 125/85 (!) 131/94 129/84 118/86  Pulse: 98 (!) 101 99 78  Resp: (!) 31 (!) 36 (!) 32 (!) 23  Temp:      TempSrc:      SpO2: 100% 98% 98% 94%  Weight:      Height:  Eyes: lids and conjunctivae normal ENMT: Mucous membranes are moist.  Neck: normal, supple Respiratory: clear to auscultation bilaterally, no wheezing, no crackles. Normal respiratory effort. No accessory muscle use.  Cardiovascular: Regular rate and rhythm, no murmurs / rubs / gallops. +2 pitting bilateral ankle Abdomen: no tenderness,  Bowel sounds positive.  Musculoskeletal: no clubbing / cyanosis. No joint deformity upper and lower extremities. Good ROM. Skin: no rashes, lesions, ulcers. No induration Neurologic: CN 2-12 grossly intact. Strength 5/5 in all 4.  Psychiatric: Poor insight into health issues. Alert and oriented x 3. Normal mood.     Labs on Admission: I have personally reviewed following labs and imaging studies  CBC: Recent Labs  Lab 08/30/21 1215  WBC 3.4*  NEUTROABS 2.1  HGB 12.2*  HCT 38.9*  MCV 77.5*  PLT 528   Basic Metabolic Panel: Recent Labs  Lab 08/30/21 0832 08/30/21 1215  NA 142 139  K 4.7 4.5  CL 106 109  CO2 24 21*  GLUCOSE 265* 250*  BUN 24 23  CREATININE 1.39* 1.33*  CALCIUM 9.4 8.4*   GFR: Estimated Creatinine Clearance: 51.6 mL/min (A) (by C-G formula based on SCr of 1.33 mg/dL (H)). Liver Function Tests: Recent Labs  Lab 08/30/21 0832  AST 34  ALT 32  ALKPHOS 101  BILITOT 0.7  PROT 7.2  ALBUMIN 4.1   No results for input(s): LIPASE, AMYLASE in the last 168 hours. No results for input(s): AMMONIA in the last 168  hours. Coagulation Profile: No results for input(s): INR, PROTIME in the last 168 hours. Cardiac Enzymes: No results for input(s): CKTOTAL, CKMB, CKMBINDEX, TROPONINI in the last 168 hours. BNP (last 3 results) No results for input(s): PROBNP in the last 8760 hours. HbA1C: No results for input(s): HGBA1C in the last 72 hours. CBG: Recent Labs  Lab 08/30/21 1615  GLUCAP 226*   Lipid Profile: Recent Labs    08/30/21 0832  CHOL 91*  HDL 36*  LDLCALC 44  TRIG 39  CHOLHDL 2.5   Thyroid Function Tests: No results for input(s): TSH, T4TOTAL, FREET4, T3FREE, THYROIDAB in the last 72 hours. Anemia Panel: No results for input(s): VITAMINB12, FOLATE, FERRITIN, TIBC, IRON, RETICCTPCT in the last 72 hours. Urine analysis:    Component Value Date/Time   COLORURINE YELLOW 05/11/2018 1209   APPEARANCEUR HAZY (A) 05/11/2018 1209   LABSPEC 1.011 05/11/2018 1209   PHURINE 5.0 05/11/2018 1209   GLUCOSEU >=500 (A) 05/11/2018 1209   HGBUR LARGE (A) 05/11/2018 1209   BILIRUBINUR NEGATIVE 05/11/2018 1209   KETONESUR NEGATIVE 05/11/2018 1209   PROTEINUR NEGATIVE 05/11/2018 1209   UROBILINOGEN 1.0 01/01/2013 1157   NITRITE NEGATIVE 05/11/2018 1209   LEUKOCYTESUR NEGATIVE 05/11/2018 1209    Radiological Exams on Admission: DG Chest 2 View  Result Date: 08/30/2021 CLINICAL DATA:  Chest pain. Shortness of breath. History of congestive heart failure, hypertension and diabetes. EXAM: CHEST - 2 VIEW COMPARISON:  Radiographs 02/12/2020.  CT 05/05/2021. FINDINGS: Left subclavian AICD leads appear unchanged. The heart size and mediastinal contours are stable status post median sternotomy, CABG and TAVR. There are new small bilateral pleural effusions with associated bibasilar atelectasis superimposed on chronic left lower lobe scarring. Mild vascular congestion is present without overt pulmonary edema. There is no pneumothorax or confluent airspace opacity. The bones appear unchanged. IMPRESSION: New  small bilateral pleural effusions with associated bibasilar atelectasis and vascular congestion. No overt pulmonary edema. Electronically Signed   By: Richardean Sale M.D.   On: 08/30/2021 12:35  ECHOCARDIOGRAM COMPLETE  Result Date: 08/30/2021    ECHOCARDIOGRAM REPORT   Patient Name:   TROY KANOUSE Date of Exam: 08/30/2021 Medical Rec #:  [id number]     Height:       71.0 in Accession #:    [id number]    Weight:       163.0 lb Date of Birth:  10-Feb-1951    BSA:          1.933 m Patient Age:    42 years      BP:           127/87 mmHg Patient Gender: M             HR:           94 bpm. Exam Location:  [address] Procedure: 2D Echo, 3D Echo, Cardiac Doppler and Color Doppler Indications:    R06.02 SOB  History:        Patient has prior history of Echocardiogram examinations, most                 recent 12/23/2019. CHF, CAD; Risk Factors:Hypertension and HLD.                 Aortic Valve: 29 mm Edwards Sapien prosthetic, stented (TAVR)                 valve is present in the aortic position. Procedure Date:                 05/15/2018.  Sonographer:    Marygrace Drought RCS Referring Phys: [place]  1. Left ventricular ejection fraction, by estimation, is 20 to 25%. The left ventricle has severely decreased function. The left ventricle demonstrates global hypokinesis. There is mild left ventricular hypertrophy. Left ventricular diastolic parameters  are indeterminate.  2. Right ventricular systolic function is mildly reduced. The right ventricular size is moderately enlarged. There is moderately elevated pulmonary artery systolic pressure. The estimated right ventricular systolic pressure is 16.0 mmHg.  3. Right atrial size was mildly dilated.  4. Large pleural effusion in the left lateral region.  5. The mitral valve is normal in structure. Moderate mitral valve regurgitation. No evidence of mitral stenosis.  6. Tricuspid valve regurgitation is moderate.  7. There is a 29 mm Edwards Sapien  prosthetic (TAVR) valve present in the aortic position     Aortic valve regurgitation is not visualized. Aortic valve mean gradient measures 2.0 mmHg.  8. Aortic dilatation noted. There is mild dilatation of the ascending aorta, measuring 38 mm.  9. The inferior vena cava is dilated in size with <50% respiratory variability, suggesting right atrial pressure of 15 mmHg. FINDINGS  Left Ventricle: Left ventricular ejection fraction, by estimation, is 20 to 25%. The left ventricle has severely decreased function. The left ventricle demonstrates global hypokinesis. The left ventricular internal cavity size was normal in size. There is mild left ventricular hypertrophy. Left ventricular diastolic parameters are indeterminate. Right Ventricle: The right ventricular size is moderately enlarged. Right vetricular wall thickness was not well visualized. Right ventricular systolic function is mildly reduced. There is moderately elevated pulmonary artery systolic pressure. The tricuspid regurgitant velocity is 2.98 m/s, and with an assumed right atrial pressure of 15 mmHg, the estimated right ventricular systolic pressure is 73.7 mmHg. Left Atrium: Left atrial size was normal in size. Right Atrium: Right atrial size was mildly dilated. Pericardium: Trivial pericardial effusion is present. Mitral Valve: The mitral valve is normal  in structure. Moderate mitral valve regurgitation. No evidence of mitral valve stenosis. Tricuspid Valve: The tricuspid valve is normal in structure. Tricuspid valve regurgitation is moderate. Aortic Valve: The aortic valve has been repaired/replaced. Aortic valve regurgitation is not visualized. Aortic valve mean gradient measures 2.0 mmHg. Aortic valve peak gradient measures 5.0 mmHg. Aortic valve area, by VTI measures 1.33 cm. There is a 29 mm Edwards Sapien prosthetic, stented (TAVR) valve present in the aortic position. Procedure Date: 05/15/2018. Pulmonic Valve: The pulmonic valve was not well  visualized. Pulmonic valve regurgitation is not visualized. Aorta: The aortic root is normal in size and structure and aortic dilatation noted. There is mild dilatation of the ascending aorta, measuring 38 mm. Venous: The inferior vena cava is dilated in size with less than 50% respiratory variability, suggesting right atrial pressure of 15 mmHg. IAS/Shunts: The interatrial septum was not well visualized. Additional Comments: There is a large pleural effusion in the left lateral region.  LEFT VENTRICLE PLAX 2D LVIDd:         4.40 cm   Diastology LVIDs:         4.10 cm   LV e' medial:    5.44 cm/s LV PW:         1.30 cm   LV E/e' medial:  19.9 LV IVS:        1.10 cm   LV e' lateral:   12.20 cm/s LVOT diam:     1.80 cm   LV E/e' lateral: 8.9 LV SV:         22 LV SV Index:   11 LVOT Area:     2.54 cm                           3D Volume EF:                          3D EF:        29 %                          LV EDV:       198 ml                          LV ESV:       141 ml                          LV SV:        57 ml RIGHT VENTRICLE RV Basal diam:  5.60 cm RV Mid diam:    4.80 cm RV S prime:     10.90 cm/s TAPSE (M-mode): 1.7 cm RVSP:           43.5 mmHg LEFT ATRIUM             Index        RIGHT ATRIUM           Index LA diam:        4.70 cm 2.43 cm/m   RA Pressure: 8.00 mmHg LA Vol (A2C):   87.1 ml 45.06 ml/m  RA Area:     20.40 cm LA Vol (A4C):   33.5 ml 17.33 ml/m  RA Volume:   62.00 ml  32.07 ml/m LA Biplane Vol: 56.4 ml 29.18 ml/m  AORTIC VALVE AV Area (Vmax):  1.11 cm AV Area (Vmean):   1.24 cm AV Area (VTI):     1.33 cm AV Vmax:           112.00 cm/s AV Vmean:          70.900 cm/s AV VTI:            0.166 m AV Peak Grad:      5.0 mmHg AV Mean Grad:      2.0 mmHg LVOT Vmax:         48.70 cm/s LVOT Vmean:        34.450 cm/s LVOT VTI:          0.087 m LVOT/AV VTI ratio: 0.52  AORTA Ao Root diam: 3.20 cm Ao Asc diam:  3.80 cm MITRAL VALVE                TRICUSPID VALVE MV Area (PHT):              TR  Peak grad:   35.5 mmHg MV Decel Time:              TR Vmax:        298.00 cm/s MV E velocity: 108.00 cm/s  Estimated RAP:  8.00 mmHg                             RVSP:           43.5 mmHg                              SHUNTS                             Systemic VTI:  0.09 m                             Systemic Diam: 1.80 cm Oswaldo Milian MD Electronically signed by Oswaldo Milian MD Signature Date/Time: 08/30/2021/1:03:43 PM    Final       Assessment/Plan  Acute on chronic systolic HF  Had echocardiogram today showing EF of 20-25% with severe decreased LV function. Large left sided pleural effusion. CXR in ED showing small bilateral pleural effusion. Continue IV 40mg  Lasix BID per cardiology for now.  -strict Intake and output. Baseline weight of 160lbs which is same as weight on admission -daily weights.  -Entresto discontinued in the past due to hypotension  CAD s/p CABG Hx of NSTEMI s/p PCI with in-stent restenosis untimately requiring CABG  -no chest pain. Troponin reassuring 17 x2  -pt reports not taking aspirin for many years because he thought it was discontinued by cardiology which is not the case. Will resume daily 81mg  aspirin.   AS s/p TAVR repeat echo today showed stable valve. No regurgitation.   CKD Creatinine stable at 1.3. Monitor closely with IV diuresis  HTN Continue Losartan, metoprolol  HLD  Continue simvastatin, Omega-3  Type 2 diabetes  Home regimen Levermir 26 units BID and Humalog SSI  Start with 15 unit BID Levermir and moderate SSI   DVT prophylaxis:.Lovenox Code Status: Full Family Communication: Plan discussed with patient at bedside  disposition Plan: Home with at least 2 midnight stays  Consults called:  Admission status: inpatient   Level of care: Telemetry Cardiac  Status is: Inpatient  Remains inpatient appropriate because:  Admit - It is my clinical opinion that admission to INPATIENT is reasonable and necessary because this  patient will require at least 2 midnights in the hospital to treat this condition based on the medical complexity of the problems presented.  Given the aforementioned information, the predictability of an adverse outcome is felt to be significant.         Orene Desanctis DO [facility]   If 7PM-7AM, please contact night-coverage [email or web address]   08/30/2021, 6:43 PM

## 2021-08-30 NOTE — Progress Notes (Signed)
Patient ID: [name]                 DOB: Nov 23, 1950                      MRN: [id number]     HPI: [name] is a 71 y.o. male referred by Bill Rough, NP to pharmacy clinic for HF medication management. PMH is significant for CAD, aortic stenosis (severe 2v CAD with total occlusion of ostial RCA and Lcx), HTN, systolic HF (hospitalized for HF on 05/15/2018), CKD 3, diabetes, tachycardia, hx of CVA, hx of NSTEMI (2013), obstructive sleep apnea, hx of lung cancer. Stenting completed in 2013, CABGx2V in 2018, 2019 (SVG--> PDA, SVG--> Lcx). Had a transcatheter aortic valve replacement done in 05/2018. Most recent LVEF <20% on 12/2019.  He was also found to have SVT which was unclear whether sinus in origin or atrial tach.  He was already on metoprolol which was increased to 75 mg daily.  2D echocardiogram and Lexiscan Myoview ordered but canceled on follow-up study as heart rate was still not under control and we wanted his heart rate to be better controlled first.  A D-dimer was elevated and the chest CTA was negative for PE but showed left lower lobe atelectasis versus PNA with early interstitial edema.  He was started on Lasix 40 mg twice daily for 3 days.   He was seen back in our office by Bill Ku, PA-C and EKG showed persistent of SVT.  He was started on Corlanor.  It was felt that possibly his CHF is exacerbated by his tachycardia.  His weight was down 5 pounds at that office visit and his Lasix was decreased to 20 mg daily and less than 2 g of sodium diet with 2 L of fluid restriction.  He ultimately underwent BiV ICD implantation on 01/2020 by [name] for persistent LV dysfunction with EF less than 25.  His baseline weight in the past had been around 162 pounds, but in the last few months of 2022, he had been running around 154. At his visit on 12/12, he had only been taking Lasix as needed for weight greater than 165 (about twice a week). He was mostly concerned about his SOB, stating he  gets SOB with minimal activity and sometimes at rest. Also states he was having trouble sleeping and had been sleeping in a recliner as he cannot sleep flat or on his side (likely orthopnea, PND). He had 1-2+ pitting lower extremity edema as well as JVD today on exam. Lasix was increased to 40 mg daily for one week and ASA 81 mg was added back to regimen. EKG demonstrated LBBB on exam.  At his last visit on 12/20, pt reports breathing and sleeping better since increasing his Lasix. Stress test and ECHO ordered for patient. Lasix was decreased to 20 mg daily to maintain dry weight. SGLT2 inhibitors were discussed, but patient would like to discuss the meds in Jan.  Toprol from increased from 75 mg to 100 mg daily during PharmD phone visit on 08/04/21 d/t elevated HR.   Pt called the clinic to report he had been making medication changes, but is still SOB with minimal activity (swelling improved). He reported inadherence to a low salt diet. [name] recommended increasing lasix back to 40 mg daily and scheduled a BMP to check renal function on f/u on 1/16 in addition to a stress test and ECHO.    Today,  Plan: GDMT:  - Will plan to increase to toprol 200mg  daily at next visit - hopefully BP will have room to increase losartan dose or add spironolactone.  - wanted to discuss SGLT2i with [name] before starting. Sees her on 1/20. - can start conversation - simvastatin 20 mg daily? Still taking     Current CHF meds: Toprol XL 100mg  daily, losartan 25mg  daily, Lasix 40mg  daily, and Corlanor 5mg  BID. Simvastatin 20 mg  Previously tried: Atorvastatin - muscle pain, Entresto (hypotension) BP goal: <130/80  Family History: Mother - diabetes, heart failure, hypertension, father - diabetes, brother - cancer  Social History:   Diet:   Exercise:   Home BP readings:   Wt Readings from Last 3 Encounters:  08/30/21 163 lb (73.9 kg)  08/03/21 163 lb 3.2 oz (74 kg)  07/26/21 165 lb 6.4 oz  (75 kg)   BP Readings from Last 3 Encounters:  08/03/21 132/82  07/26/21 112/66  05/06/21 128/89   Pulse Readings from Last 3 Encounters:  08/03/21 96  07/26/21 95  05/06/21 92    Renal function: Estimated Creatinine Clearance: 52.5 mL/min (A) (by C-G formula based on SCr of 1.33 mg/dL (H)).  Past Medical History:  Diagnosis Date   AICD (automatic cardioverter/defibrillator) present 02/11/2020   Anemia    Arthritis    Atrial tachycardia (HCC)    Chronic systolic CHF (congestive heart failure) ([place]) 07/04/2018   CKD (chronic kidney disease), stage III ([place])    Coronary artery disease 02/2012   a. s/p stenting in 2013  b. s/p CABGx2V (SVG--> PDA, SVG--> LCx).   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  Intolerant to PAP therapy   S/P TAVR (transcatheter aortic valve replacement) 05/15/2018   29 mm Edwards Sapien 3 transcatheter heart valve placed via percutaneous right transfemoral approach    Severe aortic stenosis     Current Outpatient Medications on File Prior to Visit  Medication Sig Dispense Refill   amitriptyline (ELAVIL) 25 MG tablet Take 25 mg by mouth at bedtime.      amoxicillin (AMOXIL) 500 MG capsule TAKE 4 CAPSULES BY MOUTH 1 HOUR PRIOR TO DENTAL PROCEDURES 4 capsule 3   B Complex-C (SUPER B COMPLEX PO) Take 1 tablet by mouth daily.     B-D ULTRAFINE III SHORT PEN 31G X 8 MM MISC 1 each by Other route daily  as needed (GLUCOSE TESTING (INSULINE NEEDLE)).      furosemide (LASIX) 20 MG tablet Take 2 tablets (40 mg total) by mouth daily. 180 tablet 3   insulin detemir (LEVEMIR) 100 UNIT/ML injection Inject 10-18 Units into the skin See admin instructions. 18 units in the morning, 10 units at bedtime     insulin lispro (HUMALOG) 100 UNIT/ML KwikPen Inject 2-3 Units into the skin See admin instructions. Per sliding scale at Breakfast and [employer]  ivabradine (CORLANOR) 5 MG TABS tablet Take 1 tablet (5 mg total) by mouth 2 (two) times daily. Please keep upcoming appointment for future refills. Thank you 60 tablet 1   levothyroxine (SYNTHROID, LEVOTHROID) 50 MCG tablet Take 50 mcg by mouth daily before breakfast.      losartan (COZAAR) 25 MG tablet TAKE 1 TABLET(25 MG) BY MOUTH DAILY 90 tablet 3   metFORMIN (GLUCOPHAGE) 500 MG tablet Take 2 tablets (1,000 mg total) by mouth 2 (two) times daily with a meal.     metoprolol succinate (TOPROL-XL) 100 MG 24 hr tablet Take 1 tablet (100 mg total) by mouth daily. Take with or immediately following a meal. 30 tablet 5   Multiple Vitamin (MULTIVITAMIN) tablet Take 1 tablet by mouth daily.     Omega-3 Fatty Acids (FISH OIL PO) Take by mouth.     ONE TOUCH ULTRA TEST test strip 1 each by Other route as needed for other.      simvastatin (ZOCOR) 20 MG tablet Take 20 mg by mouth every evening.      simvastatin (ZOCOR) 40 MG tablet Take 20 mg by mouth daily.     zolpidem (AMBIEN) 5 MG tablet Take 2.5 mg by mouth at bedtime.     No current facility-administered medications on file prior to visit.    Allergies  Allergen Reactions   Atorvastatin Other (See Comments)    Muscle pain Other reaction(s): cramps   Adhesive [Tape] Rash     Assessment/Plan:  1. CHF -

## 2021-08-30 NOTE — Consult Note (Signed)
Cardiology Consultation:   Patient ID: [name] MRN: [id number]; DOB: Jan 11, 1951  Admit date: 08/30/2021 Date of Consult: 08/30/2021  PCP:  [name], MD   [facility] Providers Cardiologist:  Fransico Him, MD  Electrophysiologist:  Thompson Grayer, MD       Patient Profile:   [name] is a 71 y.o. male with a hx of ischemic cardiomyopathy who is being seen 08/30/2021 for the evaluation of shortness of breath emergency room at the request of the emergency room.Marland Kitchen  History of Present Illness:   [name] is a 71 year old recently divorced thin appearing African-American male patient of Dr. Olivia Mackie Turner's who we are asked to consult on for congestive heart failure.  He has a history of CAD and aortic stenosis.  He had circumflex OM intervention in 2013, severe two-vessel disease in 2014 and ultimately underwent CABG with a vein to the PDA and circumflex.  His EF has in the past been in the 35 to 40% range.  He weighs himself on a daily basis with weights in the 160 range 2 or 3 pounds on either side.  He had been tried on Entresto but was intolerant because of hypotension.  His EF was found to be reduced in the 20 to 25% range and he underwent ICD implantation by Dr. Rayann Heman.  He is also had SVT in the past.  Because of low gradient severe aortic stenosis he underwent TAVR with a 29 mm Edwards SAPIEN valve 05/15/2018 which provided improvement in his symptoms.  Unfortunately, because of dietary discretion related to his recent divorce he has been eating salty food.  He had progressive dyspnea on exertion and lower extremity edema but denies chest pain.  He was seen at the [facility] office today for 2D echo and Myoview stress test.  His echo showed an EF of 20 to 25% with a well-functioning aortic bioprosthesis.  He was also noted to have a left pleural effusion.  He was seen by Dr. Acie Fredrickson who felt that he should be evaluated in the hospital setting and diuresed prior to  undergoing Myoview stress testing.   Past Medical History:  Diagnosis Date   AICD (automatic cardioverter/defibrillator) present 02/11/2020   Anemia    Arthritis    Atrial tachycardia (HCC)    Chronic systolic CHF (congestive heart failure) ([place]) 07/04/2018   CKD (chronic kidney disease), stage III ([place])    Coronary artery disease 02/2012   a. s/p stenting in 2013  b. s/p CABGx2V (SVG--> PDA, SVG--> LCx).   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  Intolerant to PAP therapy   S/P TAVR (transcatheter aortic valve replacement) 05/15/2018   29 mm Edwards Sapien 3 transcatheter heart valve placed via percutaneous right transfemoral approach    Severe aortic stenosis     Past Surgical History:  Procedure Laterality Date   BIV  ICD INSERTION CRT-D N/A 02/11/2020   Procedure: BIV ICD INSERTION CRT-D;  Surgeon: Thompson Grayer, MD;  Location: [place] CV LAB;  Service: Cardiovascular;  Laterality: N/A;   COLONOSCOPY  07/25/2012   Procedure: COLONOSCOPY;  Surgeon: Winfield Cunas., MD;  Location: [facility] ENDOSCOPY;  Service: Endoscopy;  Laterality: N/A;   COLONOSCOPY N/A 12/02/2013   Procedure: COLONOSCOPY;  Surgeon: Winfield Cunas., MD;  Location: [facility] ENDOSCOPY;  Service: Endoscopy;  Laterality: N/A;   CORONARY ARTERY BYPASS GRAFT N/A 01/04/2013   Procedure: CORONARY ARTERY BYPASS GRAFTING (CABG) times two using right saphenous vein  harvested with endoscope.;  Surgeon: Ivin Poot, MD;  Location: [place];  Service: Open Heart Surgery;  Laterality: N/A;   ESOPHAGOGASTRODUODENOSCOPY  07/20/2012   Procedure: ESOPHAGOGASTRODUODENOSCOPY (EGD);  Surgeon: Winfield Cunas., MD;  Location: [facility] ENDOSCOPY;  Service: Endoscopy;  Laterality: N/A;   ESOPHAGOGASTRODUODENOSCOPY N/A 12/02/2013   Procedure: ESOPHAGOGASTRODUODENOSCOPY (EGD);  Surgeon: Winfield Cunas., MD;  Location: [facility] ENDOSCOPY;  Service: Endoscopy;  Laterality: N/A;   HOT HEMOSTASIS N/A 12/02/2013   Procedure: HOT HEMOSTASIS (ARGON PLASMA COAGULATION/BICAP);  Surgeon: Winfield Cunas., MD;  Location: [facility] ENDOSCOPY;  Service: Endoscopy;  Laterality: N/A;   INTRAOPERATIVE TRANSESOPHAGEAL ECHOCARDIOGRAM N/A 01/04/2013   Procedure: INTRAOPERATIVE TRANSESOPHAGEAL ECHOCARDIOGRAM;  Surgeon: Ivin Poot, MD;  Location: [place];  Service: Open Heart Surgery;  Laterality: N/A;   INTRAOPERATIVE TRANSTHORACIC ECHOCARDIOGRAM N/A 05/15/2018   Procedure: INTRAOPERATIVE TRANSTHORACIC ECHOCARDIOGRAM;  Surgeon: Burnell Blanks, MD;  Location: [place];  Service: Open Heart Surgery;  Laterality: N/A;   IR THORACENTESIS ASP PLEURAL SPACE W/IMG GUIDE  10/19/2018   LEFT HEART CATHETERIZATION WITH CORONARY ANGIOGRAM N/A 03/06/2012   Procedure: LEFT HEART CATHETERIZATION WITH CORONARY ANGIOGRAM;  Surgeon: [name], MD;  Location: [place] CATH LAB;  Service: Cardiovascular;  Laterality: N/A;   LITHOTRIPSY     [place] Left 01/04/2013   Procedure: RADIAL ARTERY HARVEST;  Surgeon: Ivin Poot, MD;  Location: [place];  Service: Vascular;  Laterality: Left;  Artery not havested. Unsuitable for use.   resection mediastinal seminonma     RIGHT/LEFT HEART CATH AND CORONARY/GRAFT ANGIOGRAPHY N/A 11/15/2016   Procedure: Right/Left Heart Cath and Coronary/Graft Angiography;  Surgeon: Leonie Man, MD;  Location: [name] CV LAB;  Service: Cardiovascular;   Laterality: N/A;   RIGHT/LEFT HEART CATH AND CORONARY/GRAFT ANGIOGRAPHY N/A 04/13/2018   Procedure: RIGHT/LEFT HEART CATH AND CORONARY/GRAFT ANGIOGRAPHY;  Surgeon: Jolaine Artist, MD;  Location: [place] CV LAB;  Service: Cardiovascular;  Laterality: N/A;   TRANSCATHETER AORTIC VALVE REPLACEMENT, TRANSFEMORAL  05/15/2018   TRANSCATHETER AORTIC VALVE REPLACEMENT, TRANSFEMORAL N/A 05/15/2018   Procedure: TRANSCATHETER AORTIC VALVE REPLACEMENT, TRANSFEMORAL;  Surgeon: Burnell Blanks, MD;  Location: [place];  Service: Open Heart Surgery;  Laterality: N/A;     Home Medications:  Prior to Admission medications   Medication Sig Start Date End Date Taking? Authorizing Provider  amitriptyline (ELAVIL) 25 MG tablet Take 25 mg by mouth at bedtime.     [provider]  amoxicillin (AMOXIL) 500 MG capsule TAKE 4 CAPSULES BY MOUTH 1 HOUR PRIOR TO DENTAL PROCEDURES 05/27/20   Eileen Stanford, PA-C  B Complex-C (SUPER B COMPLEX PO) Take 1 tablet by mouth daily.    [provider]  B-D ULTRAFINE III SHORT PEN 31G X 8 MM MISC 1 each by Other route daily as needed (GLUCOSE TESTING (INSULINE NEEDLE)).  04/22/13   [provider]  furosemide (LASIX) 20 MG tablet Take 2 tablets (40 mg total) by mouth daily. 08/18/21   Elgie Collard, PA-C  insulin detemir (LEVEMIR) 100 UNIT/ML injection Inject 10-18 Units into the skin See admin instructions. 18 units in the morning, 10 units at bedtime    [provider]  insulin lispro (HUMALOG) 100 UNIT/ML KwikPen Inject 2-3 Units into the skin See admin instructions. Per sliding scale at Breakfast and Dinner 03/26/19   [provider]  ivabradine (CORLANOR) 5 MG TABS tablet Take 1 tablet (5 mg total) by mouth 2 (two) times daily. Please keep upcoming appointment for future refills. Thank you 07/19/21   [name], MD  levothyroxine (SYNTHROID, LEVOTHROID) 50 MCG tablet Take 50 mcg by mouth daily before breakfast.      [provider]  losartan (COZAAR) 25 MG tablet TAKE 1 TABLET(25 MG) BY MOUTH DAILY 02/22/21   [name], MD  metFORMIN (GLUCOPHAGE) 500 MG tablet Take 2 tablets (1,000 mg total) by mouth 2 (two) times daily with a meal. 05/17/18   Eileen Stanford, PA-C  metoprolol succinate (TOPROL-XL) 100 MG 24 hr tablet Take 1 tablet (100 mg total) by mouth daily. Take with or immediately following a meal. 08/04/21   Turner, Eber Hong, MD  Multiple Vitamin (MULTIVITAMIN) tablet Take 1 tablet by mouth daily.    [provider]  Omega-3 Fatty Acids (FISH OIL PO) Take by mouth.    [provider]  ONE TOUCH ULTRA TEST test strip 1 each by Other route as needed for other.  01/15/13   [provider]  simvastatin (ZOCOR) 20 MG tablet Take 20 mg by mouth every evening.     [provider]  simvastatin (ZOCOR) 40 MG tablet Take 20 mg by mouth daily. 07/29/21   [provider]  zolpidem (AMBIEN) 5 MG tablet Take 2.5 mg by mouth at bedtime. 12/26/19   [provider]    Inpatient Medications: Scheduled Meds:  Continuous Infusions:  PRN Meds: albuterol  Allergies:    Allergies  Allergen Reactions   Atorvastatin Other (See Comments)    Muscle pain Other reaction(s): cramps   Adhesive [Tape] Rash    Social History:   Social History   Socioeconomic History   Marital status: Married    Spouse name: Not on file   Number of children: Not on file   Years of education: Not on file   Highest education level: Not on file  Occupational History   Not on file  Tobacco Use   Smoking status: Former    Types: Cigarettes    Quit date: 03/06/1985    Years since quitting: 36.5   Smokeless tobacco: Never  Vaping Use   Vaping Use: Never used  Substance and Sexual Activity   Alcohol use: No   Drug use: No   Sexual activity: Not Currently  Other Topics Concern   Not on file  Social History Narrative   Not on file   Social Determinants of  Health   Financial Resource Strain: Not on file  Food Insecurity: Not on file  Transportation Needs: Not on file  Physical Activity: Not on file  Stress: Not on file  Social Connections: Not on file  Intimate Partner Violence: Not on file    Family History:    Family History  Problem Relation Age of Onset   Diabetes Father    Diabetes Mother    Heart failure Mother  Hypertension Mother    Cancer Brother    Diabetes Sister      ROS:  Please see the history of present illness.   All other ROS reviewed and negative.     Physical Exam/Data:   Vitals:   08/30/21 1158 08/30/21 1453 08/30/21 1630 08/30/21 1700  BP: 115/72 126/87 125/85 (!) 131/94  Pulse: 94 99 98 (!) 101  Resp: 20 16 (!) 31 (!) 36  Temp: (!) 97.4 F (36.3 C) 98.6 F (37 C)    TempSrc: Oral Oral    SpO2: 100% 98% 100% 98%  Weight:      Height:       No intake or output data in the 24 hours ending 08/30/21 1730 Last 3 Weights 08/30/2021 08/30/2021 08/03/2021  Weight (lbs) 160 lb 163 lb 163 lb 3.2 oz  Weight (kg) 72.576 kg 73.936 kg 74.027 kg     Body mass index is 22.32 kg/m.  General:  Well nourished, well developed, in no acute distress HEENT: normal Neck: He had moderate JVD Vascular: No carotid bruits; Distal pulses 2+ bilaterally Cardiac:  normal S1, S2; RRR; no murmur  Lungs: Scattered crackles bilaterally with decreased breath sounds at the bases Abd: soft, nontender, no hepatomegaly  Ext: 2+ edema bilaterally Musculoskeletal:  No deformities, BUE and BLE strength normal and equal Skin: warm and dry  Neuro:  CNs 2-12 intact, no focal abnormalities noted Psych:  Normal affect   EKG:  The EKG was personally reviewed and demonstrates: Paced rhythm at 94 Telemetry:  Telemetry was personally reviewed and demonstrates: Sinus rhythm  Relevant CV Studies: 2D echocardiogram (08/30/2021)   1. Left ventricular ejection fraction, by estimation, is 20 to 25%. The  left ventricle has severely  decreased function. The left ventricle  demonstrates global hypokinesis. There is mild left ventricular  hypertrophy. Left ventricular diastolic parameters   are indeterminate.   2. Right ventricular systolic function is mildly reduced. The right  ventricular size is moderately enlarged. There is moderately elevated  pulmonary artery systolic pressure. The estimated right ventricular  systolic pressure is 13.0 mmHg.   3. Right atrial size was mildly dilated.   4. Large pleural effusion in the left lateral region.   5. The mitral valve is normal in structure. Moderate mitral valve  regurgitation. No evidence of mitral stenosis.   6. Tricuspid valve regurgitation is moderate.   7. There is a 29 mm Edwards Sapien prosthetic (TAVR) valve present in the  aortic position      Aortic valve regurgitation is not visualized. Aortic valve mean  gradient measures 2.0 mmHg.   8. Aortic dilatation noted. There is mild dilatation of the ascending  aorta, measuring 38 mm.   9. The inferior vena cava is dilated in size with <50% respiratory  variability, suggesting right atrial pressure of 15 mmHg.   Laboratory Data:  High Sensitivity Troponin:   Recent Labs  Lab 08/30/21 1215 08/30/21 1409  TROPONINIHS 17 17     Chemistry Recent Labs  Lab 08/30/21 1215  NA 139  K 4.5  CL 109  CO2 21*  GLUCOSE 250*  BUN 23  CREATININE 1.33*  CALCIUM 8.4*  GFRNONAA 57*  ANIONGAP 9    No results for input(s): PROT, ALBUMIN, AST, ALT, ALKPHOS, BILITOT in the last 168 hours. Lipids No results for input(s): CHOL, TRIG, HDL, LABVLDL, LDLCALC, CHOLHDL in the last 168 hours.  Hematology Recent Labs  Lab 08/30/21 1215  WBC 3.4*  RBC 5.02  HGB  12.2*  HCT 38.9*  MCV 77.5*  MCH 24.3*  MCHC 31.4  RDW 16.3*  PLT 247   Thyroid No results for input(s): TSH, FREET4 in the last 168 hours.  BNP Recent Labs  Lab 08/30/21 1215  BNP 1,424.1*    DDimer No results for input(s): DDIMER in the last 168  hours.   Radiology/Studies:  DG Chest 2 View  Result Date: 08/30/2021 CLINICAL DATA:  Chest pain. Shortness of breath. History of congestive heart failure, hypertension and diabetes. EXAM: CHEST - 2 VIEW COMPARISON:  Radiographs 02/12/2020.  CT 05/05/2021. FINDINGS: Left subclavian AICD leads appear unchanged. The heart size and mediastinal contours are stable status post median sternotomy, CABG and TAVR. There are new small bilateral pleural effusions with associated bibasilar atelectasis superimposed on chronic left lower lobe scarring. Mild vascular congestion is present without overt pulmonary edema. There is no pneumothorax or confluent airspace opacity. The bones appear unchanged. IMPRESSION: New small bilateral pleural effusions with associated bibasilar atelectasis and vascular congestion. No overt pulmonary edema. Electronically Signed   By: Richardean Sale M.D.   On: 08/30/2021 12:35   ECHOCARDIOGRAM COMPLETE  Result Date: 08/30/2021    ECHOCARDIOGRAM REPORT   Patient Name:   [name] Date of Exam: 08/30/2021 Medical Rec #:  [id number]     Height:       71.0 in Accession #:    [id number]    Weight:       163.0 lb Date of Birth:  1951/05/17    BSA:          1.933 m Patient Age:    76 years      BP:           127/87 mmHg Patient Gender: M             HR:           94 bpm. Exam Location:  [place] Procedure: 2D Echo, 3D Echo, Cardiac Doppler and Color Doppler Indications:    R06.02 SOB  History:        Patient has prior history of Echocardiogram examinations, most                 recent 12/23/2019. CHF, CAD; Risk Factors:Hypertension and HLD.                 Aortic Valve: 29 mm Edwards Sapien prosthetic, stented (TAVR)                 valve is present in the aortic position. Procedure Date:                 05/15/2018.  Sonographer:    Marygrace Drought RCS Referring Phys: [place]  1. Left ventricular ejection fraction, by estimation, is 20 to 25%. The left ventricle has  severely decreased function. The left ventricle demonstrates global hypokinesis. There is mild left ventricular hypertrophy. Left ventricular diastolic parameters  are indeterminate.  2. Right ventricular systolic function is mildly reduced. The right ventricular size is moderately enlarged. There is moderately elevated pulmonary artery systolic pressure. The estimated right ventricular systolic pressure is 12.4 mmHg.  3. Right atrial size was mildly dilated.  4. Large pleural effusion in the left lateral region.  5. The mitral valve is normal in structure. Moderate mitral valve regurgitation. No evidence of mitral stenosis.  6. Tricuspid valve regurgitation is moderate.  7. There is a 29 mm Edwards Sapien prosthetic (TAVR) valve present in the  aortic position     Aortic valve regurgitation is not visualized. Aortic valve mean gradient measures 2.0 mmHg.  8. Aortic dilatation noted. There is mild dilatation of the ascending aorta, measuring 38 mm.  9. The inferior vena cava is dilated in size with <50% respiratory variability, suggesting right atrial pressure of 15 mmHg. FINDINGS  Left Ventricle: Left ventricular ejection fraction, by estimation, is 20 to 25%. The left ventricle has severely decreased function. The left ventricle demonstrates global hypokinesis. The left ventricular internal cavity size was normal in size. There is mild left ventricular hypertrophy. Left ventricular diastolic parameters are indeterminate. Right Ventricle: The right ventricular size is moderately enlarged. Right vetricular wall thickness was not well visualized. Right ventricular systolic function is mildly reduced. There is moderately elevated pulmonary artery systolic pressure. The tricuspid regurgitant velocity is 2.98 m/s, and with an assumed right atrial pressure of 15 mmHg, the estimated right ventricular systolic pressure is 28.7 mmHg. Left Atrium: Left atrial size was normal in size. Right Atrium: Right atrial size was mildly  dilated. Pericardium: Trivial pericardial effusion is present. Mitral Valve: The mitral valve is normal in structure. Moderate mitral valve regurgitation. No evidence of mitral valve stenosis. Tricuspid Valve: The tricuspid valve is normal in structure. Tricuspid valve regurgitation is moderate. Aortic Valve: The aortic valve has been repaired/replaced. Aortic valve regurgitation is not visualized. Aortic valve mean gradient measures 2.0 mmHg. Aortic valve peak gradient measures 5.0 mmHg. Aortic valve area, by VTI measures 1.33 cm. There is a 29 mm Edwards Sapien prosthetic, stented (TAVR) valve present in the aortic position. Procedure Date: 05/15/2018. Pulmonic Valve: The pulmonic valve was not well visualized. Pulmonic valve regurgitation is not visualized. Aorta: The aortic root is normal in size and structure and aortic dilatation noted. There is mild dilatation of the ascending aorta, measuring 38 mm. Venous: The inferior vena cava is dilated in size with less than 50% respiratory variability, suggesting right atrial pressure of 15 mmHg. IAS/Shunts: The interatrial septum was not well visualized. Additional Comments: There is a large pleural effusion in the left lateral region.  LEFT VENTRICLE PLAX 2D LVIDd:         4.40 cm   Diastology LVIDs:         4.10 cm   LV e' medial:    5.44 cm/s LV PW:         1.30 cm   LV E/e' medial:  19.9 LV IVS:        1.10 cm   LV e' lateral:   12.20 cm/s LVOT diam:     1.80 cm   LV E/e' lateral: 8.9 LV SV:         22 LV SV Index:   11 LVOT Area:     2.54 cm                           3D Volume EF:                          3D EF:        29 %                          LV EDV:       198 ml  LV ESV:       141 ml                          LV SV:        57 ml RIGHT VENTRICLE RV Basal diam:  5.60 cm RV Mid diam:    4.80 cm RV S prime:     10.90 cm/s TAPSE (M-mode): 1.7 cm RVSP:           43.5 mmHg LEFT ATRIUM             Index        RIGHT ATRIUM           Index  LA diam:        4.70 cm 2.43 cm/m   RA Pressure: 8.00 mmHg LA Vol (A2C):   87.1 ml 45.06 ml/m  RA Area:     20.40 cm LA Vol (A4C):   33.5 ml 17.33 ml/m  RA Volume:   62.00 ml  32.07 ml/m LA Biplane Vol: 56.4 ml 29.18 ml/m  AORTIC VALVE AV Area (Vmax):    1.11 cm AV Area (Vmean):   1.24 cm AV Area (VTI):     1.33 cm AV Vmax:           112.00 cm/s AV Vmean:          70.900 cm/s AV VTI:            0.166 m AV Peak Grad:      5.0 mmHg AV Mean Grad:      2.0 mmHg LVOT Vmax:         48.70 cm/s LVOT Vmean:        34.450 cm/s LVOT VTI:          0.087 m LVOT/AV VTI ratio: 0.52  AORTA Ao Root diam: 3.20 cm Ao Asc diam:  3.80 cm MITRAL VALVE                TRICUSPID VALVE MV Area (PHT):              TR Peak grad:   35.5 mmHg MV Decel Time:              TR Vmax:        298.00 cm/s MV E velocity: 108.00 cm/s  Estimated RAP:  8.00 mmHg                             RVSP:           43.5 mmHg                              SHUNTS                             Systemic VTI:  0.09 m                             Systemic Diam: 1.80 cm Oswaldo Milian MD Electronically signed by Oswaldo Milian MD Signature Date/Time: 08/30/2021/1:03:43 PM    Final      Assessment and Plan:   Chronic systolic heart failure-patient has EF in the 20% range.  He admits to dietary indiscretion over the last 2 months related to his social situation.  He  is on furosemide 20 mg a day which was briefly increased to 40 mg a day for 1 week which resulted in some improvement in addition to losartan and beta-blocker.  He will need more aggressive diuresis during his admission.  Suggest furosemide 40 mg IV twice daily with careful follow-up of his renal function. Coronary artery disease-history of prior STEMI, stents in the past and ultimately CABG. subsequent cath revealed severe native vessel disease with patent grafts.  The patient denies chest pain. Essential hypertension-blood pressure in the 130/90 range.  He is on beta-blocker and  losartan Severe low gradient low output AF-status post TAVR 05/15/2018 with a 29 mm Edwards SAPIEN valve via the femoral approach.  2D echo performed today revealed this to be widely patent . Hyperlipidemia-on statin therapy per Dr. Radford Pax Tachycardia-on beta-blocker.  He saw Laurann Montana, NP in the office in December who recommended titration of his beta-blocker.   Risk Assessment/Risk Scores:                For questions or updates, please contact [place] [place] Please consult [email or web address] for contact info under    Signed, Quay Burow, MD  08/30/2021 5:30 PM

## 2021-08-30 NOTE — Progress Notes (Signed)
This encounter was created in error - please disregard.

## 2021-08-31 ENCOUNTER — Encounter (HOSPITAL_COMMUNITY): Payer: Self-pay | Admitting: Family Medicine

## 2021-08-31 ENCOUNTER — Other Ambulatory Visit (HOSPITAL_COMMUNITY): Payer: Self-pay

## 2021-08-31 DIAGNOSIS — I5023 Acute on chronic systolic (congestive) heart failure: Secondary | ICD-10-CM | POA: Diagnosis not present

## 2021-08-31 DIAGNOSIS — Z952 Presence of prosthetic heart valve: Secondary | ICD-10-CM

## 2021-08-31 LAB — CBG MONITORING, ED
Glucose-Capillary: 132 mg/dL — ABNORMAL HIGH (ref 70–99)
Glucose-Capillary: 114 mg/dL — ABNORMAL HIGH (ref 70–99)

## 2021-08-31 LAB — COMPREHENSIVE METABOLIC PANEL
ALT: 32 IU/L (ref 0–44)
AST: 34 IU/L (ref 0–40)
Albumin/Globulin Ratio: 1.3 (ref 1.2–2.2)
Albumin: 4.1 g/dL (ref 3.7–4.7)
Alkaline Phosphatase: 101 IU/L (ref 44–121)
BUN/Creatinine Ratio: 17 (ref 10–24)
BUN: 24 mg/dL (ref 8–27)
Bilirubin Total: 0.7 mg/dL (ref 0.0–1.2)
CO2: 24 mmol/L (ref 20–29)
Calcium: 9.4 mg/dL (ref 8.6–10.2)
Chloride: 106 mmol/L (ref 96–106)
Creatinine, Ser: 1.39 mg/dL — ABNORMAL HIGH (ref 0.76–1.27)
Globulin, Total: 3.1 g/dL (ref 1.5–4.5)
Glucose: 265 mg/dL — ABNORMAL HIGH (ref 70–99)
Potassium: 4.7 mmol/L (ref 3.5–5.2)
Sodium: 142 mmol/L (ref 134–144)
Total Protein: 7.2 g/dL (ref 6.0–8.5)
eGFR: 54 mL/min/{1.73_m2} — ABNORMAL LOW (ref >59)

## 2021-08-31 LAB — LIPID PANEL
Chol/HDL Ratio: 2.5 ratio (ref 0.0–5.0)
Cholesterol, Total: 91 mg/dL — ABNORMAL LOW (ref 100–199)
HDL: 36 mg/dL — ABNORMAL LOW (ref >39)
LDL Chol Calc (NIH): 44 mg/dL (ref 0–99)
Triglycerides: 39 mg/dL (ref 0–149)
VLDL Cholesterol Cal: 11 mg/dL (ref 5–40)

## 2021-08-31 LAB — BASIC METABOLIC PANEL
Anion gap: 11 (ref 5–15)
BUN: 21 mg/dL (ref 8–23)
CO2: 25 mmol/L (ref 22–32)
Calcium: 8.9 mg/dL (ref 8.9–10.3)
Chloride: 104 mmol/L (ref 98–111)
Creatinine, Ser: 1.36 mg/dL — ABNORMAL HIGH (ref 0.61–1.24)
GFR, Estimated: 55 mL/min — ABNORMAL LOW (ref >60)
Glucose, Bld: 295 mg/dL — ABNORMAL HIGH (ref 70–99)
Potassium: 3.7 mmol/L (ref 3.5–5.1)
Sodium: 140 mmol/L (ref 135–145)

## 2021-08-31 LAB — GLUCOSE, CAPILLARY
Glucose-Capillary: 222 mg/dL — ABNORMAL HIGH (ref 70–99)
Glucose-Capillary: 84 mg/dL (ref 70–99)

## 2021-08-31 LAB — BRAIN NATRIURETIC PEPTIDE: BNP: 1931.7 pg/mL — ABNORMAL HIGH (ref 0.0–100.0)

## 2021-08-31 LAB — HEMOGLOBIN A1C
Hgb A1c MFr Bld: 8.3 % — ABNORMAL HIGH (ref 4.8–5.6)
Mean Plasma Glucose: 191.51 mg/dL

## 2021-08-31 MED ORDER — POTASSIUM CHLORIDE CRYS ER 20 MEQ PO TBCR
40.0000 meq | EXTENDED_RELEASE_TABLET | Freq: Once | ORAL | Status: AC
  Administered 2021-08-31: 40 meq via ORAL
  Filled 2021-08-31: qty 2

## 2021-08-31 MED ORDER — ALBUTEROL SULFATE (2.5 MG/3ML) 0.083% IN NEBU: 2.5000 mg | INHALATION_SOLUTION | RESPIRATORY_TRACT | Status: DC | PRN

## 2021-08-31 NOTE — TOC Progression Note (Signed)
Transition of Care [facility]) - Progression Note    Patient Details  Name: [name] MRN: [id number] Date of Birth: 08-04-1951  Transition of Care [facility]) CM/SW Contact  Zenon Mayo, RN Phone Number: 08/31/2021, 4:31 PM  Clinical Narrative:     Transition of Care [facility]) Screening Note   Patient Details  Name: [name] Date of Birth: November 08, 1950   [facility]) CM/SW Contact:    Zenon Mayo, RN Phone Number: 08/31/2021, 4:31 PM    Transition of Care Department [facility]) has reviewed patient and no TOC needs have been identified at this time. We will continue to monitor patient advancement through interdisciplinary progression rounds. If new patient transition needs arise, please place a TOC consult.          Expected Discharge Plan and Services                                                 Social Determinants of Health (SDOH) Interventions Food Insecurity Interventions: Intervention Not Indicated Financial Strain Interventions: Intervention Not Indicated Housing Interventions: Intervention Not Indicated Transportation Interventions: Intervention Not Indicated  Readmission Risk Interventions No flowsheet data found.

## 2021-08-31 NOTE — Progress Notes (Signed)
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  He was sent for a stress test and was seen by cardiology nuclear medicine and was found to be short of breath.  Echo showed EF of 20 to 25% with severely decreased left ventricular function, large left-sided pleural effusion.  Sent to the ED for admission and diuresis.  On presentation BNP was elevated.  Chest x-ray bilateral small pleural effusion.  Currently on IV diuresis  Assessment & Plan:   Principal Problem:   Acute on chronic systolic CHF (congestive heart failure) (HCC) Active Problems:   DM (diabetes mellitus) (HCC)   Dyslipidemia   Hypertension   CAD (coronary artery disease), native coronary artery   S/P TAVR (transcatheter aortic valve replacement)   Acute on chronic systolic congestive heart failure: Echo showed EF of 20 to 25% with severely decreased left ventricular function.Echo on 2021 with same. Elevated BNP. Chest x-ray in the emergency department showed small bilateral pleural effusion. Cardiology following.  Started on Lasix 40 mg IV twice daily.  Continue to monitor daily weight, input/output. Takes ivabradine  History of coronary artery  disease: Status post CABG. no anginal symptoms.  Troponin minimally elevated, flat trend.  On aspirin, metoprolol.  History of aortic stenosis: Status post TAVR.  Stable as per recent echo  CKD stage IIIb: Baseline  creatinine 1.2.  Currently kidney function close to baseline.  Monitor  Hypothyroidism: On Synthyroid  Hypertension: On losartan and metoprolol at home.  Continue to monitor blood pressure.  Continue current medications  Hyperlipidemia: Simvastatin  Pancytopenia: Mild leukopenia, hemoglobin 12.2 with microcytosis.  Continue to monitor.  Insulin-dependent type 2 diabetes: Continue current insulin regimen.  Monitor blood sugars  OSA: Not on cpap.we recommend outpatient sleep study        DVT prophylaxis:Lovenox Code Status: Full Family Communication: Called wife on phone for update today, call not received. Patient status:Inpatient  Dispo: The patient is from: Home              Anticipated d/c is to: Home              Anticipated d/c date is: In next 1 to 2 days, needs cardiology clearance  Consultants : Cardiology  Procedures: None  Antimicrobials:  Anti-infectives (From admission, onward)    None       Subjective:  Patient seen and examined at the bedside this morning in the emergency department.  Hemodynamically stable.  Comfortable.  On room air.  Any shortness of breath or cough.  Still has bilateral lower EXTR edema but he feels much better today.  No new complaints  Objective: Vitals:   08/31/21 0700 08/31/21 0715 08/31/21 0730 08/31/21 0745  BP: 110/88 115/82 113/78 118/87  Pulse: 88 90 89 88  Resp: 17 20 18 20   Temp:      TempSrc:      SpO2: 100% 100% 100% 100%  Weight:      Height:  Intake/Output Summary (Last 24 hours) at 08/31/2021 6269 Last data filed at 08/30/2021 1922 Gross per 24 hour  Intake --  Output 950 ml  Net -950 ml   Filed Weights   08/30/21 1156  Weight: 72.6 kg    Examination:  General exam: Overall comfortable, not in distress HEENT: PERRL Respiratory system:  no wheezes or crackles  Cardiovascular system: S1 & S2 heard, RRR.  Gastrointestinal system: Abdomen is nondistended, soft and nontender. Central nervous  system: Alert and oriented Extremities: 1-2+ bilateral lower extremity pitting edema, no clubbing ,no cyanosis Skin: No rashes, no ulcers,no icterus      Data Reviewed: I have personally reviewed following labs and imaging studies  CBC: Recent Labs  Lab 08/30/21 1215  WBC 3.4*  NEUTROABS 2.1  HGB 12.2*  HCT 38.9*  MCV 77.5*  PLT 485   Basic Metabolic Panel: Recent Labs  Lab 08/30/21 0832 08/30/21 1215 08/31/21 0304  NA 142 139 140  K 4.7 4.5 3.7  CL 106 109 104  CO2 24 21* 25  GLUCOSE 265* 250* 295*  BUN 24 23 21   CREATININE 1.39* 1.33* 1.36*  CALCIUM 9.4 8.4* 8.9   GFR: Estimated Creatinine Clearance: 50.4 mL/min (A) (by C-G formula based on SCr of 1.36 mg/dL (H)). Liver Function Tests: Recent Labs  Lab 08/30/21 0832  AST 34  ALT 32  ALKPHOS 101  BILITOT 0.7  PROT 7.2  ALBUMIN 4.1   No results for input(s): LIPASE, AMYLASE in the last 168 hours. No results for input(s): AMMONIA in the last 168 hours. Coagulation Profile: No results for input(s): INR, PROTIME in the last 168 hours. Cardiac Enzymes: No results for input(s): CKTOTAL, CKMB, CKMBINDEX, TROPONINI in the last 168 hours. BNP (last 3 results) No results for input(s): PROBNP in the last 8760 hours. HbA1C: Recent Labs    08/31/21 0305  HGBA1C 8.3*   CBG: Recent Labs  Lab 08/30/21 1615 08/30/21 2201 08/31/21 0754  GLUCAP 226* 203* 132*   Lipid Profile: Recent Labs    08/30/21 0832  CHOL 91*  HDL 36*  LDLCALC 44  TRIG 39  CHOLHDL 2.5   Thyroid Function Tests: No results for input(s): TSH, T4TOTAL, FREET4, T3FREE, THYROIDAB in the last 72 hours. Anemia Panel: No results for input(s): VITAMINB12, FOLATE, FERRITIN, TIBC, IRON, RETICCTPCT in the last 72 hours. Sepsis Labs: No results for input(s): PROCALCITON, LATICACIDVEN in the last 168 hours.  Recent Results (from the past 240 hour(s))  Resp Panel by RT-PCR (Flu A&B, Covid) Nasopharyngeal Swab     Status: None   Collection  Time: 08/30/21  4:37 PM   Specimen: Nasopharyngeal Swab; Nasopharyngeal(NP) swabs in vial transport medium  Result Value Ref Range Status   SARS Coronavirus 2 by RT PCR NEGATIVE NEGATIVE Final    Comment: (NOTE) SARS-CoV-2 target nucleic acids are NOT DETECTED.  The SARS-CoV-2 RNA is generally detectable in upper respiratory specimens during the acute phase of infection. The lowest concentration of SARS-CoV-2 viral copies this assay can detect is 138 copies/mL. A negative result does not preclude SARS-Cov-2 infection and should not be used as the sole basis for treatment or other patient management decisions. A negative result may occur with  improper specimen collection/handling, submission of specimen other than nasopharyngeal swab, presence of viral mutation(s) within the areas targeted by this assay, and inadequate number of viral copies(<138 copies/mL). A negative result must be combined with clinical observations, patient history, and epidemiological information. The expected result is Negative.  Fact Sheet  for Patients:  EntrepreneurPulse.com.au  Fact Sheet for Healthcare Providers:  IncredibleEmployment.be  This test is no t yet approved or cleared by the [place] FDA and  has been authorized for detection and/or diagnosis of SARS-CoV-2 by FDA under an Emergency Use Authorization (EUA). This EUA will remain  in effect (meaning this test can be used) for the duration of the COVID-19 declaration under Section 564(b)(1) of the Act, 21 U.S.C.section 360bbb-3(b)(1), unless the authorization is terminated  or revoked sooner.       Influenza A by PCR NEGATIVE NEGATIVE Final   Influenza B by PCR NEGATIVE NEGATIVE Final    Comment: (NOTE) The Xpert Xpress SARS-CoV-2/FLU/RSV plus assay is intended as an aid in the diagnosis of influenza from Nasopharyngeal swab specimens and should not be used as a sole basis for treatment. Nasal washings  and aspirates are unacceptable for Xpert Xpress SARS-CoV-2/FLU/RSV testing.  Fact Sheet for Patients: EntrepreneurPulse.com.au  Fact Sheet for Healthcare Providers: IncredibleEmployment.be  This test is not yet approved or cleared by the [place] FDA and has been authorized for detection and/or diagnosis of SARS-CoV-2 by FDA under an Emergency Use Authorization (EUA). This EUA will remain in effect (meaning this test can be used) for the duration of the COVID-19 declaration under Section 564(b)(1) of the Act, 21 U.S.C. section 360bbb-3(b)(1), unless the authorization is terminated or revoked.  Performed at [facility], [place] [address], [place], [place] [address]          Radiology Studies: DG Chest 2 View  Result Date: 08/30/2021 CLINICAL DATA:  Chest pain. Shortness of breath. History of congestive heart failure, hypertension and diabetes. EXAM: CHEST - 2 VIEW COMPARISON:  Radiographs 02/12/2020.  CT 05/05/2021. FINDINGS: Left subclavian AICD leads appear unchanged. The heart size and mediastinal contours are stable status post median sternotomy, CABG and TAVR. There are new small bilateral pleural effusions with associated bibasilar atelectasis superimposed on chronic left lower lobe scarring. Mild vascular congestion is present without overt pulmonary edema. There is no pneumothorax or confluent airspace opacity. The bones appear unchanged. IMPRESSION: New small bilateral pleural effusions with associated bibasilar atelectasis and vascular congestion. No overt pulmonary edema. Electronically Signed   By: Richardean Sale M.D.   On: 08/30/2021 12:35   ECHOCARDIOGRAM COMPLETE  Result Date: 08/30/2021    ECHOCARDIOGRAM REPORT   Patient Name:   [name] Date of Exam: 08/30/2021 Medical Rec #:  [id number]     Height:       71.0 in Accession #:    [id number]    Weight:       163.0 lb Date of Birth:  October 22, 1950    BSA:          1.933 m  Patient Age:    34 years      BP:           127/87 mmHg Patient Gender: M             HR:           94 bpm. Exam Location:  [place] Procedure: 2D Echo, 3D Echo, Cardiac Doppler and Color Doppler Indications:    R06.02 SOB  History:        Patient has prior history of Echocardiogram examinations, most                 recent 12/23/2019. CHF, CAD; Risk Factors:Hypertension and HLD.  Aortic Valve: 29 mm Edwards Sapien prosthetic, stented (TAVR)                 valve is present in the aortic position. Procedure Date:                 05/15/2018.  Sonographer:    Marygrace Drought RCS Referring Phys: [place]  1. Left ventricular ejection fraction, by estimation, is 20 to 25%. The left ventricle has severely decreased function. The left ventricle demonstrates global hypokinesis. There is mild left ventricular hypertrophy. Left ventricular diastolic parameters  are indeterminate.  2. Right ventricular systolic function is mildly reduced. The right ventricular size is moderately enlarged. There is moderately elevated pulmonary artery systolic pressure. The estimated right ventricular systolic pressure is 09.8 mmHg.  3. Right atrial size was mildly dilated.  4. Large pleural effusion in the left lateral region.  5. The mitral valve is normal in structure. Moderate mitral valve regurgitation. No evidence of mitral stenosis.  6. Tricuspid valve regurgitation is moderate.  7. There is a 29 mm Edwards Sapien prosthetic (TAVR) valve present in the aortic position     Aortic valve regurgitation is not visualized. Aortic valve mean gradient measures 2.0 mmHg.  8. Aortic dilatation noted. There is mild dilatation of the ascending aorta, measuring 38 mm.  9. The inferior vena cava is dilated in size with <50% respiratory variability, suggesting right atrial pressure of 15 mmHg. FINDINGS  Left Ventricle: Left ventricular ejection fraction, by estimation, is 20 to 25%. The left ventricle has  severely decreased function. The left ventricle demonstrates global hypokinesis. The left ventricular internal cavity size was normal in size. There is mild left ventricular hypertrophy. Left ventricular diastolic parameters are indeterminate. Right Ventricle: The right ventricular size is moderately enlarged. Right vetricular wall thickness was not well visualized. Right ventricular systolic function is mildly reduced. There is moderately elevated pulmonary artery systolic pressure. The tricuspid regurgitant velocity is 2.98 m/s, and with an assumed right atrial pressure of 15 mmHg, the estimated right ventricular systolic pressure is 11.9 mmHg. Left Atrium: Left atrial size was normal in size. Right Atrium: Right atrial size was mildly dilated. Pericardium: Trivial pericardial effusion is present. Mitral Valve: The mitral valve is normal in structure. Moderate mitral valve regurgitation. No evidence of mitral valve stenosis. Tricuspid Valve: The tricuspid valve is normal in structure. Tricuspid valve regurgitation is moderate. Aortic Valve: The aortic valve has been repaired/replaced. Aortic valve regurgitation is not visualized. Aortic valve mean gradient measures 2.0 mmHg. Aortic valve peak gradient measures 5.0 mmHg. Aortic valve area, by VTI measures 1.33 cm. There is a 29 mm Edwards Sapien prosthetic, stented (TAVR) valve present in the aortic position. Procedure Date: 05/15/2018. Pulmonic Valve: The pulmonic valve was not well visualized. Pulmonic valve regurgitation is not visualized. Aorta: The aortic root is normal in size and structure and aortic dilatation noted. There is mild dilatation of the ascending aorta, measuring 38 mm. Venous: The inferior vena cava is dilated in size with less than 50% respiratory variability, suggesting right atrial pressure of 15 mmHg. IAS/Shunts: The interatrial septum was not well visualized. Additional Comments: There is a large pleural effusion in the left lateral  region.  LEFT VENTRICLE PLAX 2D LVIDd:         4.40 cm   Diastology LVIDs:         4.10 cm   LV e' medial:    5.44 cm/s LV PW:  1.30 cm   LV E/e' medial:  19.9 LV IVS:        1.10 cm   LV e' lateral:   12.20 cm/s LVOT diam:     1.80 cm   LV E/e' lateral: 8.9 LV SV:         22 LV SV Index:   11 LVOT Area:     2.54 cm                           3D Volume EF:                          3D EF:        29 %                          LV EDV:       198 ml                          LV ESV:       141 ml                          LV SV:        57 ml RIGHT VENTRICLE RV Basal diam:  5.60 cm RV Mid diam:    4.80 cm RV S prime:     10.90 cm/s TAPSE (M-mode): 1.7 cm RVSP:           43.5 mmHg LEFT ATRIUM             Index        RIGHT ATRIUM           Index LA diam:        4.70 cm 2.43 cm/m   RA Pressure: 8.00 mmHg LA Vol (A2C):   87.1 ml 45.06 ml/m  RA Area:     20.40 cm LA Vol (A4C):   33.5 ml 17.33 ml/m  RA Volume:   62.00 ml  32.07 ml/m LA Biplane Vol: 56.4 ml 29.18 ml/m  AORTIC VALVE AV Area (Vmax):    1.11 cm AV Area (Vmean):   1.24 cm AV Area (VTI):     1.33 cm AV Vmax:           112.00 cm/s AV Vmean:          70.900 cm/s AV VTI:            0.166 m AV Peak Grad:      5.0 mmHg AV Mean Grad:      2.0 mmHg LVOT Vmax:         48.70 cm/s LVOT Vmean:        34.450 cm/s LVOT VTI:          0.087 m LVOT/AV VTI ratio: 0.52  AORTA Ao Root diam: 3.20 cm Ao Asc diam:  3.80 cm MITRAL VALVE                TRICUSPID VALVE MV Area (PHT):              TR Peak grad:   35.5 mmHg MV Decel Time:              TR Vmax:        298.00 cm/s MV E velocity: 108.00 cm/s  Estimated RAP:  8.00  mmHg                             RVSP:           43.5 mmHg                              SHUNTS                             Systemic VTI:  0.09 m                             Systemic Diam: 1.80 cm Oswaldo Milian MD Electronically signed by Oswaldo Milian MD Signature Date/Time: 08/30/2021/1:03:43 PM    Final         Scheduled Meds:   amitriptyline  25 mg Oral QHS   aspirin EC  81 mg Oral Daily   enoxaparin (LOVENOX) injection  40 mg Subcutaneous Q24H   furosemide  40 mg Intravenous BID   insulin aspart  0-15 Units Subcutaneous TID WC   insulin detemir  15 Units Subcutaneous BID   ivabradine  5 mg Oral BID   levothyroxine  50 mcg Oral QAC breakfast   losartan  25 mg Oral Daily   metoprolol succinate  100 mg Oral Daily   omega-3 acid ethyl esters  1 g Oral Daily   potassium chloride  40 mEq Oral Once   simvastatin  20 mg Oral QPM   zolpidem  2.5 mg Oral QHS   Continuous Infusions:   LOS: 1 day       Shelly Coss, MD [facility] P1/17/2023, 8:21 AM

## 2021-08-31 NOTE — Progress Notes (Unsigned)
No ICM remote transmission received for 08/31/2021 due to currently hospitalized and next ICM transmission scheduled for 09/14/2021.

## 2021-08-31 NOTE — Progress Notes (Signed)
Heart Failure Stewardship Pharmacist Progress Note   PCP: [name], MD PCP-Cardiologist: Fransico Him, MD    HPI:  71 yo M with PMH of CAD s/p CABG, ICM, LBB, AVR (TAVR), CHF, HTN, and HLD. He presented to the ED on 1/16 following a cardiology appt for shortness of breath and worsened leg edema. CXR significant for bilateral pleural effusions and vascular congestion without overt pulmonary edema. An ECHO was done 1/16 and LVEF was 20-25% (<20% in 12/2019; last time it was >40% was in 10/2015).  Current HF Medications: Diuretic: furosemide 40 mg IV BID Beta Blocker: metoprolol XL 100 mg daily ACE/ARB/ARNI: losartan 25 mg daily Other: ivabradine 5 mg BID  Prior to admission HF Medications: Diuretic: furosemide 40 mg daily Beta blocker: metoprolol XL 100 mg daily ACE/ARB/ARNI: losartan 25 mg daily  Pertinent Lab Values: Serum creatinine 1.36, BUN 21, Potassium 3.7, Sodium 140, BNP 1424.1, A1c 8.3   Vital Signs: Weight: 160 lbs (admission weight: 160 lbs) Blood pressure: 110/80s  Heart rate: 80s  I/O: -1L yesterday; net -1.7L  Medication Assistance / Insurance Benefits Check: Does the patient have prescription insurance?  Yes Type of insurance plan: [place]:  Prior to admission outpatient pharmacy: [facility] Is the patient willing to use [facility] at discharge? Yes Is the patient willing to transition their outpatient pharmacy to utilize a [facility] outpatient pharmacy?   Pending    Assessment: 1. Acute on chronic systolic CHF (EF 61-44%), due to ICM. NYHA class III symptoms. - Continue furosemide 40 mg IV BID - monitor I/O and renal function for titration - Continue metoprolol XL 100 mg daily - Continue losartan 25 mg daily. Had been on Entresto in the past but was stopped due to hypotension - consider rechallenging this admission - Consider adding spironolactone and SGLT2i prior to discharge - Continue ivabradine 5 mg BID  Plan: 1)  Medication changes recommended at this time: - Continue IV diuresis  2) Patient assistance: - Patient's initial copay for brand name meds is higher because he hasnt met his deductible yet ($323)  3)  Education  - To be completed prior to discharge  Kerby Nora, PharmD, BCPS Heart Failure Stewardship Pharmacist Phone [phone]

## 2021-08-31 NOTE — Progress Notes (Signed)
Heart Failure Nurse Navigator Progress Note  PCP: [name], MD PCP-Cardiologist: Ashok Norris., MD Admission Diagnosis: A/C CHF Admitted from: home with family  Presentation:   [name] presented 1/16 with increased SOB. Pt resting on EOB in ED stretcher. Patient interactive with interview process. Endorses medication noncompliance---has intermittently stopped Corlanor d/t cost, states it cost $400/mo patient was unable to afford. Also endorses dietary indiscretion--understands need to decrease sodium intake and monitor fluid to 2022mL per day. Pt works as full Psychologist, counselling at [employer], receives daily lunch (often lunchmeat sandwiches or fried chicken from [employer]). Pt drives reliable vehicle.  Explained benefits of Heart & Vascular Transitions of Care Clinic appointment, patient agreeable.    ECHO/ LVEF: 20-25%, RV mildly reduced. S/P TAVR.   Clinical Course:  Past Medical History:  Diagnosis Date   AICD (automatic cardioverter/defibrillator) present 02/11/2020   Anemia    Arthritis    Atrial tachycardia (HCC)    Chronic systolic CHF (congestive heart failure) ([place]) 07/04/2018   CKD (chronic kidney disease), stage III ([place])    Coronary artery disease 02/2012   a. s/p stenting in 2013  b. s/p CABGx2V (SVG--> PDA, SVG--> LCx).   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  Intolerant to PAP therapy   S/P TAVR  (transcatheter aortic valve replacement) 05/15/2018   29 mm Edwards Sapien 3 transcatheter heart valve placed via percutaneous right transfemoral approach    Severe aortic stenosis      Social History   Socioeconomic History   Marital status: Married    Spouse name: Not on file   Number of children: Not on file   Years of education: Not on file   Highest education level: Not on file  Occupational History   Not on file  Tobacco Use   Smoking status: Former    Types: Cigarettes    Quit date: 03/06/1985    Years since quitting: 36.5   Smokeless tobacco: Never  Vaping Use   Vaping Use: Never used  Substance and Sexual Activity   Alcohol use: No   Drug use: No   Sexual activity: Not Currently  Other Topics Concern   Not on file  Social History Narrative   Not on file   Social Determinants of Health   Financial Resource Strain: Not on file  Food Insecurity: Not on file  Transportation Needs: Not on file  Physical Activity: Not on file  Stress: Not on file  Social Connections: Not on file    High Risk Criteria for Readmission and/or Poor Patient Outcomes: Heart failure hospital admissions (last 6 months): 1  No Show rate: 17% Difficult social situation: no Demonstrates medication adherence: no Primary Language: English Literacy level: able to read/write and comprehend.   Barriers of Care:   -medication compliance -financial strain -dietary indiscretion  Considerations/Referrals:   Referral made to Heart Failure  Pharmacist Stewardship: yes, to see at [facility] clinic Referral made to Heart Failure CSW/NCM TOC: no Referral made to Heart & Vascular TOC clinic: yes, 1/23  @ noon.   Items for Follow-up on DC/TOC: -optimize -medication cost -dietary modification   Pricilla Holm, MSN, RN Heart Failure Nurse Navigator [phone]

## 2021-08-31 NOTE — ED Notes (Addendum)
Patient experiencing tachypnea episodes followed by short periods of apnea. Respiratory called for possible CPAP- HX of severe sleep apnea. DO Howerter notified- new orders placed.

## 2021-08-31 NOTE — ED Notes (Signed)
Placed Breakfast Order 

## 2021-08-31 NOTE — TOC Benefit Eligibility Note (Signed)
Patient Teacher, English as a foreign language completed.    The patient is currently admitted and upon discharge could be taking Farxiga 10 mg.  The current 30 day co-pay is, $368.87 due to a $323.87 deductible remaining .   The patient is currently admitted and upon discharge could be taking Jardiance 10 mg.  The current 30 day co-pay is, $368.87 due to a $323.87 deductible remaining .   The patient is currently admitted and upon discharge could be taking Corlanor 5 mg.  The current 30 day co-pay is, $465.14 due to a $323.87 deductible remaining .   The patient is insured through [place], [place] Patient Advocate Specialist [place] Patient Advocate Team Direct Number: [phone]  Fax: [phone]

## 2021-08-31 NOTE — Progress Notes (Addendum)
Progress Note  Patient Name: [name] Date of Encounter: 08/31/2021  [facility] Cardiologist: Fransico Him, MD   Subjective   Breathing improved significantly.  No chest pain  Inpatient Medications    Scheduled Meds:  amitriptyline  25 mg Oral QHS   aspirin EC  81 mg Oral Daily   enoxaparin (LOVENOX) injection  40 mg Subcutaneous Q24H   furosemide  40 mg Intravenous BID   insulin aspart  0-15 Units Subcutaneous TID WC   insulin detemir  15 Units Subcutaneous BID   ivabradine  5 mg Oral BID   levothyroxine  50 mcg Oral QAC breakfast   losartan  25 mg Oral Daily   metoprolol succinate  100 mg Oral Daily   omega-3 acid ethyl esters  1 g Oral Daily   potassium chloride  40 mEq Oral Once   simvastatin  20 mg Oral QPM   zolpidem  2.5 mg Oral QHS   Continuous Infusions:  PRN Meds: albuterol   Vital Signs    Vitals:   08/31/21 0700 08/31/21 0715 08/31/21 0730 08/31/21 0745  BP: 110/88 115/82 113/78 118/87  Pulse: 88 90 89 88  Resp: 17 20 18 20   Temp:      TempSrc:      SpO2: 100% 100% 100% 100%  Weight:      Height:        Intake/Output Summary (Last 24 hours) at 08/31/2021 0817 Last data filed at 08/30/2021 1922 Gross per 24 hour  Intake --  Output 950 ml  Net -950 ml   Last 3 Weights 08/30/2021 08/30/2021 08/03/2021  Weight (lbs) 160 lb 163 lb 163 lb 3.2 oz  Weight (kg) 72.576 kg 73.936 kg 74.027 kg      Telemetry    Sinus rhythm  - Personally Reviewed  ECG    N/A  Physical Exam   GEN: No acute distress.   Neck: No JVD Cardiac: RRR, no murmurs, rubs, or gallops.  Respiratory: Clear to auscultation bilaterally. GI: Soft, nontender, non-distended  EP:PIRJJ edema; No deformity. Neuro:  Nonfocal  Psych: Normal affect   Labs    High Sensitivity Troponin:   Recent Labs  Lab 08/30/21 1215 08/30/21 1409  TROPONINIHS 17 17     Chemistry Recent Labs  Lab 08/30/21 0832 08/30/21 1215 08/31/21 0304  NA 142 139 140  K 4.7 4.5 3.7  CL  106 109 104  CO2 24 21* 25  GLUCOSE 265* 250* 295*  BUN 24 23 21   CREATININE 1.39* 1.33* 1.36*  CALCIUM 9.4 8.4* 8.9  PROT 7.2  --   --   ALBUMIN 4.1  --   --   AST 34  --   --   ALT 32  --   --   ALKPHOS 101  --   --   BILITOT 0.7  --   --   GFRNONAA  --  57* 55*  ANIONGAP  --  9 11    Lipids  Recent Labs  Lab 08/30/21 0832  CHOL 91*  TRIG 39  HDL 36*  LABVLDL 11  LDLCALC 44  CHOLHDL 2.5    Hematology Recent Labs  Lab 08/30/21 1215  WBC 3.4*  RBC 5.02  HGB 12.2*  HCT 38.9*  MCV 77.5*  MCH 24.3*  MCHC 31.4  RDW 16.3*  PLT 247   Thyroid No results for input(s): TSH, FREET4 in the last 168 hours.  BNP Recent Labs  Lab 08/30/21 0832 08/30/21 1215  BNP  WILL FOLLOW 1,424.1*    DDimer No results for input(s): DDIMER in the last 168 hours.   Radiology    DG Chest 2 View  Result Date: 08/30/2021 CLINICAL DATA:  Chest pain. Shortness of breath. History of congestive heart failure, hypertension and diabetes. EXAM: CHEST - 2 VIEW COMPARISON:  Radiographs 02/12/2020.  CT 05/05/2021. FINDINGS: Left subclavian AICD leads appear unchanged. The heart size and mediastinal contours are stable status post median sternotomy, CABG and TAVR. There are new small bilateral pleural effusions with associated bibasilar atelectasis superimposed on chronic left lower lobe scarring. Mild vascular congestion is present without overt pulmonary edema. There is no pneumothorax or confluent airspace opacity. The bones appear unchanged. IMPRESSION: New small bilateral pleural effusions with associated bibasilar atelectasis and vascular congestion. No overt pulmonary edema. Electronically Signed   By: Richardean Sale M.D.   On: 08/30/2021 12:35   ECHOCARDIOGRAM COMPLETE  Result Date: 08/30/2021    ECHOCARDIOGRAM REPORT   Patient Name:   [name] Date of Exam: 08/30/2021 Medical Rec #:  [id number]     Height:       71.0 in Accession #:    [id number]    Weight:       163.0 lb Date of Birth:   10-29-1950    BSA:          1.933 m Patient Age:    71 years      BP:           127/87 mmHg Patient Gender: M             HR:           94 bpm. Exam Location:  [facility] Procedure: 2D Echo, 3D Echo, Cardiac Doppler and Color Doppler Indications:    R06.02 SOB  History:        Patient has prior history of Echocardiogram examinations, most                 recent 12/23/2019. CHF, CAD; Risk Factors:Hypertension and HLD.                 Aortic Valve: 29 mm Edwards Sapien prosthetic, stented (TAVR)                 valve is present in the aortic position. Procedure Date:                 05/15/2018.  Sonographer:    Marygrace Drought RCS Referring Phys: [place]  1. Left ventricular ejection fraction, by estimation, is 20 to 25%. The left ventricle has severely decreased function. The left ventricle demonstrates global hypokinesis. There is mild left ventricular hypertrophy. Left ventricular diastolic parameters  are indeterminate.  2. Right ventricular systolic function is mildly reduced. The right ventricular size is moderately enlarged. There is moderately elevated pulmonary artery systolic pressure. The estimated right ventricular systolic pressure is 52.8 mmHg.  3. Right atrial size was mildly dilated.  4. Large pleural effusion in the left lateral region.  5. The mitral valve is normal in structure. Moderate mitral valve regurgitation. No evidence of mitral stenosis.  6. Tricuspid valve regurgitation is moderate.  7. There is a 29 mm Edwards Sapien prosthetic (TAVR) valve present in the aortic position     Aortic valve regurgitation is not visualized. Aortic valve mean gradient measures 2.0 mmHg.  8. Aortic dilatation noted. There is mild dilatation of the ascending aorta, measuring 38 mm.  9. The inferior vena  cava is dilated in size with <50% respiratory variability, suggesting right atrial pressure of 15 mmHg. FINDINGS  Left Ventricle: Left ventricular ejection fraction, by estimation, is 20  to 25%. The left ventricle has severely decreased function. The left ventricle demonstrates global hypokinesis. The left ventricular internal cavity size was normal in size. There is mild left ventricular hypertrophy. Left ventricular diastolic parameters are indeterminate. Right Ventricle: The right ventricular size is moderately enlarged. Right vetricular wall thickness was not well visualized. Right ventricular systolic function is mildly reduced. There is moderately elevated pulmonary artery systolic pressure. The tricuspid regurgitant velocity is 2.98 m/s, and with an assumed right atrial pressure of 15 mmHg, the estimated right ventricular systolic pressure is 36.6 mmHg. Left Atrium: Left atrial size was normal in size. Right Atrium: Right atrial size was mildly dilated. Pericardium: Trivial pericardial effusion is present. Mitral Valve: The mitral valve is normal in structure. Moderate mitral valve regurgitation. No evidence of mitral valve stenosis. Tricuspid Valve: The tricuspid valve is normal in structure. Tricuspid valve regurgitation is moderate. Aortic Valve: The aortic valve has been repaired/replaced. Aortic valve regurgitation is not visualized. Aortic valve mean gradient measures 2.0 mmHg. Aortic valve peak gradient measures 5.0 mmHg. Aortic valve area, by VTI measures 1.33 cm. There is a 29 mm Edwards Sapien prosthetic, stented (TAVR) valve present in the aortic position. Procedure Date: 05/15/2018. Pulmonic Valve: The pulmonic valve was not well visualized. Pulmonic valve regurgitation is not visualized. Aorta: The aortic root is normal in size and structure and aortic dilatation noted. There is mild dilatation of the ascending aorta, measuring 38 mm. Venous: The inferior vena cava is dilated in size with less than 50% respiratory variability, suggesting right atrial pressure of 15 mmHg. IAS/Shunts: The interatrial septum was not well visualized. Additional Comments: There is a large pleural  effusion in the left lateral region.  LEFT VENTRICLE PLAX 2D LVIDd:         4.40 cm   Diastology LVIDs:         4.10 cm   LV e' medial:    5.44 cm/s LV PW:         1.30 cm   LV E/e' medial:  19.9 LV IVS:        1.10 cm   LV e' lateral:   12.20 cm/s LVOT diam:     1.80 cm   LV E/e' lateral: 8.9 LV SV:         22 LV SV Index:   11 LVOT Area:     2.54 cm                           3D Volume EF:                          3D EF:        29 %                          LV EDV:       198 ml                          LV ESV:       141 ml                          LV SV:  57 ml RIGHT VENTRICLE RV Basal diam:  5.60 cm RV Mid diam:    4.80 cm RV S prime:     10.90 cm/s TAPSE (M-mode): 1.7 cm RVSP:           43.5 mmHg LEFT ATRIUM             Index        RIGHT ATRIUM           Index LA diam:        4.70 cm 2.43 cm/m   RA Pressure: 8.00 mmHg LA Vol (A2C):   87.1 ml 45.06 ml/m  RA Area:     20.40 cm LA Vol (A4C):   33.5 ml 17.33 ml/m  RA Volume:   62.00 ml  32.07 ml/m LA Biplane Vol: 56.4 ml 29.18 ml/m  AORTIC VALVE AV Area (Vmax):    1.11 cm AV Area (Vmean):   1.24 cm AV Area (VTI):     1.33 cm AV Vmax:           112.00 cm/s AV Vmean:          70.900 cm/s AV VTI:            0.166 m AV Peak Grad:      5.0 mmHg AV Mean Grad:      2.0 mmHg LVOT Vmax:         48.70 cm/s LVOT Vmean:        34.450 cm/s LVOT VTI:          0.087 m LVOT/AV VTI ratio: 0.52  AORTA Ao Root diam: 3.20 cm Ao Asc diam:  3.80 cm MITRAL VALVE                TRICUSPID VALVE MV Area (PHT):              TR Peak grad:   35.5 mmHg MV Decel Time:              TR Vmax:        298.00 cm/s MV E velocity: 108.00 cm/s  Estimated RAP:  8.00 mmHg                             RVSP:           43.5 mmHg                              SHUNTS                             Systemic VTI:  0.09 m                             Systemic Diam: 1.80 cm Oswaldo Milian MD Electronically signed by Oswaldo Milian MD Signature Date/Time: 08/30/2021/1:03:43 PM    Final      Cardiac Studies   Echo 08/30/2020  Patient Profile     71 y.o. male with history of CAD s/p CABG and PCI, ischemic cardiomyopathy s/p BiV ICD, Chronic LBBB, aortic valve disorder s/p TAVR in 0347, chronic systolic heart failure, hypertension, and hyperlipidemia seen for CHF.  Assessment & Plan    Acute on chronic systolic heart failure -Dietary noncompliance for past 2 months secondary to social situation.  Did not tolerated outpatient increase in  Lasix. -BNP 1424 -Started on IV Lasix 40 mg twice daily for diuresis.  No strict INO recorded however patient reports at least 4 urinal output.  No daily weight. -Patient is very close to euvolemic.  Record IV Lasix this morning.  Actually changed to p.o. later today or tomorrow.  Will review with MD. -Continue Corlanor 5 mg twice daily -Continue Toprol-XL 100 mg -Continue losartan 25 mg daily -Echocardiogram showed persistent low EF at 20 to 25%, mildly reduced RV function  2.  CAD -No angina -Continue aspirin, statin and beta-blocker -Has pending outpatient  stress test  3.  S/p TAVR -Normal functioning valve by echocardiogram yesterday  4.  Obstructive sleep apnea -Patient does not have a CPAP at home.  Patient felt much better after using CPAP overnight in emergency room.  Recommending arranging CPAP in outpatient setting.   Pending evaluation by Dr. Gwenlyn Found with diuretic recommendation.  He will benefit from [facility] heart failure follow-up.   For questions or updates, please contact [place] Please consult [email or web address] for contact info under        Signed, Leanor Kail, PA  08/31/2021, 8:17 AM     Agree with note by Robbie Lis PA-C  Mr. Givan looks clinically improved this morning.  He has had a good diuresis although this is not reflected in his I/O's.  He was on furosemide 40 mg IV twice daily which we will continue for now.  His peripheral edema has improved and his lungs are clear.  Telemetry reveals normal  sinus rhythm.  His renal function is stable.  I suspect that his fluid accumulation is related to dietary indiscretion related to his social situation at home.  We had a brief discussion regarding this.  He was unaware of the severity of his LV dysfunction in the 20% range.  Suspect that he will be ready for discharge in the next 24 to 48 hours.   Lorretta Harp, M.D., [place], [facility], [place] [address] [place], [place]  [address]  [phone] 08/31/2021 8:50 AM

## 2021-08-31 NOTE — ED Notes (Signed)
Lunch Ordered °

## 2021-09-01 DIAGNOSIS — Z952 Presence of prosthetic heart valve: Secondary | ICD-10-CM | POA: Diagnosis not present

## 2021-09-01 DIAGNOSIS — I2510 Atherosclerotic heart disease of native coronary artery without angina pectoris: Secondary | ICD-10-CM | POA: Diagnosis not present

## 2021-09-01 DIAGNOSIS — R0602 Shortness of breath: Secondary | ICD-10-CM | POA: Diagnosis not present

## 2021-09-01 DIAGNOSIS — I5023 Acute on chronic systolic (congestive) heart failure: Secondary | ICD-10-CM | POA: Diagnosis not present

## 2021-09-01 LAB — CBC WITH DIFFERENTIAL/PLATELET
Abs Immature Granulocytes: 0.01 10*3/uL (ref 0.00–0.07)
Basophils Absolute: 0.1 10*3/uL (ref 0.0–0.1)
Basophils Relative: 1 %
Eosinophils Absolute: 0.2 10*3/uL (ref 0.0–0.5)
Eosinophils Relative: 4 %
HCT: 39.1 % (ref 39.0–52.0)
Hemoglobin: 12.4 g/dL — ABNORMAL LOW (ref 13.0–17.0)
Immature Granulocytes: 0 %
Lymphocytes Relative: 28 %
Lymphs Abs: 1.1 10*3/uL (ref 0.7–4.0)
MCH: 24.4 pg — ABNORMAL LOW (ref 26.0–34.0)
MCHC: 31.7 g/dL (ref 30.0–36.0)
MCV: 76.8 fL — ABNORMAL LOW (ref 80.0–100.0)
Monocytes Absolute: 0.4 10*3/uL (ref 0.1–1.0)
Monocytes Relative: 10 %
Neutro Abs: 2.3 10*3/uL (ref 1.7–7.7)
Neutrophils Relative %: 57 %
Platelets: 248 10*3/uL (ref 150–400)
RBC: 5.09 MIL/uL (ref 4.22–5.81)
RDW: 16.2 % — ABNORMAL HIGH (ref 11.5–15.5)
WBC: 4.1 10*3/uL (ref 4.0–10.5)
nRBC: 0 % (ref 0.0–0.2)

## 2021-09-01 LAB — GLUCOSE, CAPILLARY
Glucose-Capillary: 66 mg/dL — ABNORMAL LOW (ref 70–99)
Glucose-Capillary: 163 mg/dL — ABNORMAL HIGH (ref 70–99)
Glucose-Capillary: 153 mg/dL — ABNORMAL HIGH (ref 70–99)
Glucose-Capillary: 77 mg/dL (ref 70–99)
Glucose-Capillary: 198 mg/dL — ABNORMAL HIGH (ref 70–99)
Glucose-Capillary: 263 mg/dL — ABNORMAL HIGH (ref 70–99)

## 2021-09-01 LAB — BASIC METABOLIC PANEL
Anion gap: 9 (ref 5–15)
BUN: 23 mg/dL (ref 8–23)
CO2: 26 mmol/L (ref 22–32)
Calcium: 8.5 mg/dL — ABNORMAL LOW (ref 8.9–10.3)
Chloride: 104 mmol/L (ref 98–111)
Creatinine, Ser: 1.39 mg/dL — ABNORMAL HIGH (ref 0.61–1.24)
GFR, Estimated: 54 mL/min — ABNORMAL LOW (ref >60)
Glucose, Bld: 177 mg/dL — ABNORMAL HIGH (ref 70–99)
Potassium: 4.4 mmol/L (ref 3.5–5.1)
Sodium: 139 mmol/L (ref 135–145)

## 2021-09-01 MED ORDER — DAPAGLIFLOZIN PROPANEDIOL 10 MG PO TABS
10.0000 mg | ORAL_TABLET | Freq: Every day | ORAL | Status: DC
  Administered 2021-09-01 – 2021-09-03 (×3): 10 mg via ORAL
  Filled 2021-09-01 (×3): qty 1

## 2021-09-01 MED ORDER — LOSARTAN POTASSIUM 25 MG PO TABS
25.0000 mg | ORAL_TABLET | Freq: Every day | ORAL | Status: DC
  Administered 2021-09-02 – 2021-09-03 (×2): 25 mg via ORAL
  Filled 2021-09-01 (×2): qty 1

## 2021-09-01 MED ORDER — SACUBITRIL-VALSARTAN 24-26 MG PO TABS: 1.0000 | ORAL_TABLET | Freq: Two times a day (BID) | ORAL | Status: DC

## 2021-09-01 MED ORDER — SPIRONOLACTONE 12.5 MG HALF TABLET
12.5000 mg | ORAL_TABLET | Freq: Every day | ORAL | Status: DC
  Administered 2021-09-01 – 2021-09-03 (×3): 12.5 mg via ORAL
  Filled 2021-09-01 (×3): qty 1

## 2021-09-01 MED ORDER — INSULIN DETEMIR 100 UNIT/ML ~~LOC~~ SOLN
10.0000 [IU] | Freq: Two times a day (BID) | SUBCUTANEOUS | Status: DC
  Administered 2021-09-01 – 2021-09-03 (×4): 10 [IU] via SUBCUTANEOUS
  Filled 2021-09-01 (×5): qty 0.1

## 2021-09-01 NOTE — Progress Notes (Signed)
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  He was sent for a stress test and was seen by cardiology nuclear medicine and was found to be short of breath.  Echo showed EF of 20 to 25% with severely decreased left ventricular function, large left-sided pleural effusion.  Sent to the ED for admission and diuresis.   Assessment & Plan:   Acute on chronic systolic congestive heart failure:  -Echo showed EF of 20 to 25% with severely decreased left ventricular function.Echo on 2021 with same.  -Chest x-ray > small bilateral pleural effusion. -Clinically improving with diuresis, he is -1.7 L, continue to wean down O2 -Continue IV Lasix 1 more day, transition to oral tomorrow -Will discontinue ivabradine, patient has been unable to afford this and not using it anyway -Cardiology following, previously developed hypotension with Delene Loll, will add low-dose Aldactone -Patient admits to dietary indiscretion, eating out a lot following recent divorce, will consult dietitian  History of coronary artery  disease: Status post CABG. no anginal symptoms.  Troponin minimally elevated, flat trend.  On aspirin, metoprolol.  History of aortic stenosis: Status post TAVR.  Stable as per recent echo  CKD stage IIIb: Baseline creatinine 1.2.  Currently kidney function close to baseline.  Monitor  Hypothyroidism: On Synthyroid  Hypertension: On losartan and metoprolol at home, continued  Hyperlipidemia:  Simvastatin  Pancytopenia: Mild leukopenia, hemoglobin 12.2 with microcytosis.  Continue to monitor.  Insulin-dependent type 2 diabetes: Continue current insulin regimen.  Monitor blood sugars  OSA: Not on cpap, recommend outpatient sleep study   DVT prophylaxis:Lovenox Code Status: Full Family Communication: No family at bedside Patient status:Inpatient  Dispo: The patient is from: Home              Anticipated d/c is to: Home              Anticipated d/c date is: In next 1 to 2 days, needs cardiology clearance  Consultants : Cardiology  Procedures: None  Antimicrobials:  Anti-infectives (From admission, onward)    None       Subjective:  -Breathing improving, not back to baseline  Objective: Vitals:   09/01/21 0000 09/01/21 0332 09/01/21 0819 09/01/21 1051  BP: 114/84 103/69 136/62 113/81  Pulse: 90 88 94 86  Resp: 18 19 18 19   Temp: (!) 97.4 F (36.3 C) 98 F (36.7 C) 98.6 F (37 C)   TempSrc: Oral Oral Oral Oral  SpO2: 98% 100% 100% 100%  Weight:  70.3 kg    Height:        Intake/Output Summary (Last 24 hours) at 09/01/2021 1133 Last data filed at 09/01/2021 1019 Gross per 24 hour  Intake 960 ml  Output 1412 ml  Net -452 ml   Filed Weights   08/30/21 1156 08/31/21 1634 09/01/21 0332  Weight: 72.6 kg 71.1 kg 70.3 kg    Examination:  General exam: Elderly male sitting up in bed, AAOx3, no distress HEENT: Positive JVD CVS: S1-S2, regular rate rhythm Lungs: Few basilar rales Abdomen: Soft,  nontender, bowel sounds present Extremities: Trace edema Skin: No rashes on exposed skin    Data Reviewed: I have personally reviewed following labs and imaging studies  CBC: Recent Labs  Lab 08/30/21 1215 09/01/21 0351  WBC 3.4* 4.1  NEUTROABS 2.1 2.3  HGB 12.2* 12.4*  HCT 38.9* 39.1  MCV 77.5* 76.8*  PLT 247 824   Basic Metabolic Panel: Recent Labs  Lab 08/30/21 0832 08/30/21 1215 08/31/21 0304 09/01/21 0351  NA 142 139 140 139  K 4.7  4.5 3.7 4.4  CL 106 109 104 104  CO2 24 21* 25 26  GLUCOSE 265* 250* 295* 177*  BUN 24 23 21 23   CREATININE 1.39* 1.33* 1.36* 1.39*  CALCIUM 9.4 8.4* 8.9 8.5*   GFR: Estimated Creatinine Clearance: 47.8 mL/min (A) (by C-G formula based on SCr of 1.39 mg/dL (H)). Liver Function Tests: Recent Labs  Lab 08/30/21 0832  AST 34  ALT 32  ALKPHOS 101  BILITOT 0.7  PROT 7.2  ALBUMIN 4.1   No results for input(s): LIPASE, AMYLASE in the last 168 hours. No results for input(s): AMMONIA in the last 168 hours. Coagulation Profile: No results for input(s): INR, PROTIME in the last 168 hours. Cardiac Enzymes: No results for input(s): CKTOTAL, CKMB, CKMBINDEX, TROPONINI in the last 168 hours. BNP (last 3 results) No results for input(s): PROBNP in the last 8760 hours. HbA1C: Recent Labs    08/31/21 0305  HGBA1C 8.3*   CBG: Recent Labs  Lab 08/31/21 2125 09/01/21 0258 09/01/21 0330 09/01/21 0612 09/01/21 1049  GLUCAP 84 66* 163* 153* 77   Lipid Profile: Recent Labs    08/30/21 0832  CHOL 91*  HDL 36*  LDLCALC 44  TRIG 39  CHOLHDL 2.5   Thyroid Function Tests: No results for input(s): TSH, T4TOTAL, FREET4, T3FREE, THYROIDAB in the last 72 hours. Anemia Panel: No results for input(s): VITAMINB12, FOLATE, FERRITIN, TIBC, IRON, RETICCTPCT in the last 72 hours. Sepsis Labs: No results for input(s): PROCALCITON, LATICACIDVEN in the last 168 hours.  Recent Results (from the past 240 hour(s))  Resp Panel by RT-PCR (Flu A&B, Covid) Nasopharyngeal Swab     Status: None   Collection Time: 08/30/21  4:37 PM   Specimen: Nasopharyngeal Swab; Nasopharyngeal(NP) swabs in vial transport medium  Result Value Ref Range Status   SARS Coronavirus 2 by RT PCR NEGATIVE NEGATIVE Final    Comment: (NOTE) SARS-CoV-2 target nucleic acids are NOT DETECTED.  The SARS-CoV-2 RNA is generally detectable in upper respiratory specimens during the acute phase of infection. The  lowest concentration of SARS-CoV-2 viral copies this assay can detect is 138 copies/mL. A negative result does not preclude SARS-Cov-2 infection and should not be used as the sole basis for treatment or other patient management decisions. A negative result may occur with  improper specimen collection/handling, submission of specimen other than nasopharyngeal swab, presence of viral mutation(s) within the areas targeted by this assay, and inadequate number of viral copies(<138 copies/mL). A negative result must be combined with clinical observations, patient history, and epidemiological information. The expected result is Negative.  Fact Sheet for Patients:  EntrepreneurPulse.com.au  Fact Sheet for Healthcare Providers:  IncredibleEmployment.be  This test is no t yet approved or cleared by the [place] FDA and  has been authorized for detection and/or diagnosis of SARS-CoV-2 by FDA under an Emergency Use Authorization (EUA). This EUA will remain  in effect (meaning this test can be used) for the duration of the COVID-19 declaration under  Section 564(b)(1) of the Act, 21 U.S.C.section 360bbb-3(b)(1), unless the authorization is terminated  or revoked sooner.       Influenza A by PCR NEGATIVE NEGATIVE Final   Influenza B by PCR NEGATIVE NEGATIVE Final    Comment: (NOTE) The Xpert Xpress SARS-CoV-2/FLU/RSV plus assay is intended as an aid in the diagnosis of influenza from Nasopharyngeal swab specimens and should not be used as a sole basis for treatment. Nasal washings and aspirates are unacceptable for Xpert Xpress SARS-CoV-2/FLU/RSV testing.  Fact Sheet for Patients: EntrepreneurPulse.com.au  Fact Sheet for Healthcare Providers: IncredibleEmployment.be  This test is not yet approved or cleared by the [place] FDA and has been authorized for detection and/or diagnosis of SARS-CoV-2 by FDA under  an Emergency Use Authorization (EUA). This EUA will remain in effect (meaning this test can be used) for the duration of the COVID-19 declaration under Section 564(b)(1) of the Act, 21 U.S.C. section 360bbb-3(b)(1), unless the authorization is terminated or revoked.  Performed at [facility], [place] [address] [place], [place] [address]          Radiology Studies: DG Chest 2 View  Result Date: 08/30/2021 CLINICAL DATA:  Chest pain. Shortness of breath. History of congestive heart failure, hypertension and diabetes. EXAM: CHEST - 2 VIEW COMPARISON:  Radiographs 02/12/2020.  CT 05/05/2021. FINDINGS: Left subclavian AICD leads appear unchanged. The heart size and mediastinal contours are stable status post median sternotomy, CABG and TAVR. There are new small bilateral pleural effusions with associated bibasilar atelectasis superimposed on chronic left lower lobe scarring. Mild vascular congestion is present without overt pulmonary edema. There is no pneumothorax or confluent airspace opacity. The bones appear unchanged. IMPRESSION: New small bilateral pleural effusions with associated bibasilar atelectasis and vascular congestion. No overt pulmonary edema. Electronically Signed   By: Richardean Sale M.D.   On: 08/30/2021 12:35        Scheduled Meds:  amitriptyline  25 mg Oral QHS   aspirin EC  81 mg Oral Daily   enoxaparin (LOVENOX) injection  40 mg Subcutaneous Q24H   furosemide  40 mg Intravenous BID   insulin aspart  0-15 Units Subcutaneous TID WC   insulin detemir  10 Units Subcutaneous BID   levothyroxine  50 mcg Oral QAC breakfast   metoprolol succinate  100 mg Oral Daily   omega-3 acid ethyl esters  1 g Oral Daily   [START ON 09/02/2021] sacubitril-valsartan  1 tablet Oral BID   simvastatin  20 mg Oral QPM   zolpidem  2.5 mg Oral QHS   Continuous Infusions:   LOS: 2 days    Domenic Polite, MD [facility] P1/18/2023, 11:33 AM

## 2021-09-01 NOTE — Progress Notes (Signed)
Pt placed on CPAP with 2LPM bled in tolerating well.

## 2021-09-01 NOTE — Progress Notes (Signed)
Heart Failure Stewardship Pharmacist Progress Note   PCP: [name], MD PCP-Cardiologist: Fransico Him, MD    HPI:  71 yo M with PMH of CAD s/p CABG, ICM, LBB, AVR (TAVR), CHF, HTN, and HLD. He presented to the ED on 1/16 following a cardiology appt for shortness of breath and worsened leg edema. CXR significant for bilateral pleural effusions and vascular congestion without overt pulmonary edema. An ECHO was done 1/16 and LVEF was 20-25% (<20% in 12/2019; last time it was >40% was in 10/2015).  Current HF Medications: Diuretic: furosemide 40 mg IV BID Beta Blocker: metoprolol XL 100 mg daily ACE/ARB/ARNI: losartan 25 mg daily  Prior to admission HF Medications: Diuretic: furosemide 40 mg daily Beta blocker: metoprolol XL 100 mg daily ACE/ARB/ARNI: losartan 25 mg daily  Pertinent Lab Values: Serum creatinine 1.39, BUN 23, Potassium 4.4, Sodium 139, BNP 1424.1, A1c 8.3   Vital Signs: Weight: 154 lbs (admission weight: 160 lbs) Blood pressure: 110/60s  Heart rate: 80s  I/O: -1.2L yesterday; net -2.2L  Medication Assistance / Insurance Benefits Check: Does the patient have prescription insurance?  Yes Type of insurance plan: [employer]:  Prior to admission outpatient pharmacy: [facility] Is the patient willing to use [facility] at discharge? Yes Is the patient willing to transition their outpatient pharmacy to utilize a [facility] outpatient pharmacy?   Pending    Assessment: 1. Acute on chronic systolic CHF (EF 65-46%), due to ICM. NYHA class III symptoms. - Continue furosemide 40 mg IV BID - monitor I/O and renal function for titration - Continue metoprolol XL 100 mg daily - Continue losartan 25 mg daily. Had been on Entresto in the past but was stopped due to hypotension - consider rechallenging this admission - Consider adding spironolactone and SGLT2i prior to discharge - Off ivabradine 5 mg BID due to cost  Plan: 1) Medication changes  recommended at this time: - Stop losartan and start Entresto 24/26 mg BID  2) Patient assistance: - Patient's initial copay for brand name meds is higher because he hasnt met his deductible yet ($323)  3)  Education  - To be completed prior to discharge  Kerby Nora, PharmD, BCPS Heart Failure Stewardship Pharmacist Phone [phone]

## 2021-09-01 NOTE — Progress Notes (Signed)
Inpatient Diabetes Program Recommendations  AACE/ADA: New Consensus Statement on Inpatient Glycemic Control (2015)  Target Ranges:  Prepandial:   less than 140 mg/dL      Peak postprandial:   less than 180 mg/dL (1-2 hours)      Critically ill patients:  140 - 180 mg/dL   Lab Results  Component Value Date   GLUCAP 153 (H) 09/01/2021   HGBA1C 8.3 (H) 08/31/2021    Review of Glycemic Control  Latest Reference Range & Units 08/31/21 07:54 08/31/21 12:09 08/31/21 16:32 08/31/21 21:25 09/01/21 02:58 09/01/21 03:30 09/01/21 06:12  Glucose-Capillary 70 - 99 mg/dL 132 (H) 114 (H) 222 (H) 84 66 (L) 163 (H) 153 (H)   Diabetes history: DM 2 Outpatient Diabetes medications:  Levemir 18 units q AM and Levemir 10 units q PM Humalog 2-3 units tid with meals Metformin 1000 mg bid Current orders for Inpatient glycemic control:  Novolog moderate tid with meals Levemir 15 units bid  Inpatient Diabetes Program Recommendations:    Please consider reducing Levemir to 10 units bid.    Thanks,  Adah Perl, RN, BC-ADM Inpatient Diabetes Coordinator Pager [phone]  (8a-5p)

## 2021-09-01 NOTE — Progress Notes (Addendum)
Progress Note  Patient Name: [name] Date of Encounter: 09/01/2021  [facility] Cardiologist: Fransico Him, MD   Subjective   Still complains of ongoing dyspnea.   Inpatient Medications    Scheduled Meds:  amitriptyline  25 mg Oral QHS   aspirin EC  81 mg Oral Daily   enoxaparin (LOVENOX) injection  40 mg Subcutaneous Q24H   furosemide  40 mg Intravenous BID   insulin aspart  0-15 Units Subcutaneous TID WC   insulin detemir  10 Units Subcutaneous BID   levothyroxine  50 mcg Oral QAC breakfast   [START ON 09/02/2021] losartan  25 mg Oral Daily   metoprolol succinate  100 mg Oral Daily   omega-3 acid ethyl esters  1 g Oral Daily   simvastatin  20 mg Oral QPM   zolpidem  2.5 mg Oral QHS   Continuous Infusions:  PRN Meds: albuterol   Vital Signs    Vitals:   09/01/21 0000 09/01/21 0332 09/01/21 0819 09/01/21 1051  BP: 114/84 103/69 136/62 113/81  Pulse: 90 88 94 86  Resp: 18 19 18 19   Temp: (!) 97.4 F (36.3 C) 98 F (36.7 C) 98.6 F (37 C)   TempSrc: Oral Oral Oral Oral  SpO2: 98% 100% 100% 100%  Weight:  70.3 kg    Height:        Intake/Output Summary (Last 24 hours) at 09/01/2021 1134 Last data filed at 09/01/2021 1019 Gross per 24 hour  Intake 960 ml  Output 1412 ml  Net -452 ml   Last 3 Weights 09/01/2021 08/31/2021 08/30/2021  Weight (lbs) 154 lb 14.4 oz 156 lb 12 oz 160 lb  Weight (kg) 70.262 kg 71.1 kg 72.576 kg      Telemetry    SR - Personally Reviewed  ECG    No review tracing   Physical Exam   GEN: No acute distress.   Neck: No JVD Cardiac: RRR, no murmurs, rubs, or gallops.  Respiratory: Clear to auscultation bilaterally. GI: Soft, nontender, non-distended  MS: No edema; No deformity. Neuro:  Nonfocal  Psych: Normal affect   Labs    High Sensitivity Troponin:   Recent Labs  Lab 08/30/21 1215 08/30/21 1409  TROPONINIHS 17 17     Chemistry Recent Labs  Lab 08/30/21 0832 08/30/21 1215 08/31/21 0304  09/01/21 0351  NA 142 139 140 139  K 4.7 4.5 3.7 4.4  CL 106 109 104 104  CO2 24 21* 25 26  GLUCOSE 265* 250* 295* 177*  BUN 24 23 21 23   CREATININE 1.39* 1.33* 1.36* 1.39*  CALCIUM 9.4 8.4* 8.9 8.5*  PROT 7.2  --   --   --   ALBUMIN 4.1  --   --   --   AST 34  --   --   --   ALT 32  --   --   --   ALKPHOS 101  --   --   --   BILITOT 0.7  --   --   --   GFRNONAA  --  57* 55* 54*  ANIONGAP  --  9 11 9     Lipids  Recent Labs  Lab 08/30/21 0832  CHOL 91*  TRIG 39  HDL 36*  LABVLDL 11  LDLCALC 44  CHOLHDL 2.5    Hematology Recent Labs  Lab 08/30/21 1215 09/01/21 0351  WBC 3.4* 4.1  RBC 5.02 5.09  HGB 12.2* 12.4*  HCT 38.9* 39.1  MCV 77.5* 76.8*  MCH 24.3* 24.4*  MCHC 31.4 31.7  RDW 16.3* 16.2*  PLT 247 248   Thyroid No results for input(s): TSH, FREET4 in the last 168 hours.  BNP Recent Labs  Lab 08/30/21 0832 08/30/21 1215  BNP 1,931.7* 1,424.1*    DDimer No results for input(s): DDIMER in the last 168 hours.   Radiology    DG Chest 2 View  Result Date: 08/30/2021 CLINICAL DATA:  Chest pain. Shortness of breath. History of congestive heart failure, hypertension and diabetes. EXAM: CHEST - 2 VIEW COMPARISON:  Radiographs 02/12/2020.  CT 05/05/2021. FINDINGS: Left subclavian AICD leads appear unchanged. The heart size and mediastinal contours are stable status post median sternotomy, CABG and TAVR. There are new small bilateral pleural effusions with associated bibasilar atelectasis superimposed on chronic left lower lobe scarring. Mild vascular congestion is present without overt pulmonary edema. There is no pneumothorax or confluent airspace opacity. The bones appear unchanged. IMPRESSION: New small bilateral pleural effusions with associated bibasilar atelectasis and vascular congestion. No overt pulmonary edema. Electronically Signed   By: Richardean Sale M.D.   On: 08/30/2021 12:35    Cardiac Studies   Echo: 08/30/2021  IMPRESSIONS     1. Left  ventricular ejection fraction, by estimation, is 20 to 25%. The  left ventricle has severely decreased function. The left ventricle  demonstrates global hypokinesis. There is mild left ventricular  hypertrophy. Left ventricular diastolic parameters   are indeterminate.   2. Right ventricular systolic function is mildly reduced. The right  ventricular size is moderately enlarged. There is moderately elevated  pulmonary artery systolic pressure. The estimated right ventricular  systolic pressure is 12.7 mmHg.   3. Right atrial size was mildly dilated.   4. Large pleural effusion in the left lateral region.   5. The mitral valve is normal in structure. Moderate mitral valve  regurgitation. No evidence of mitral stenosis.   6. Tricuspid valve regurgitation is moderate.   7. There is a 29 mm Edwards Sapien prosthetic (TAVR) valve present in the  aortic position      Aortic valve regurgitation is not visualized. Aortic valve mean  gradient measures 2.0 mmHg.   8. Aortic dilatation noted. There is mild dilatation of the ascending  aorta, measuring 38 mm.   9. The inferior vena cava is dilated in size with <50% respiratory  variability, suggesting right atrial pressure of 15 mmHg.   FINDINGS   Left Ventricle: Left ventricular ejection fraction, by estimation, is 20  to 25%. The left ventricle has severely decreased function. The left  ventricle demonstrates global hypokinesis. The left ventricular internal  cavity size was normal in size. There  is mild left ventricular hypertrophy. Left ventricular diastolic  parameters are indeterminate.   Right Ventricle: The right ventricular size is moderately enlarged. Right  vetricular wall thickness was not well visualized. Right ventricular  systolic function is mildly reduced. There is moderately elevated  pulmonary artery systolic pressure. The  tricuspid regurgitant velocity is 2.98 m/s, and with an assumed right  atrial pressure of 15 mmHg,  the estimated right ventricular systolic  pressure is 51.7 mmHg.   Left Atrium: Left atrial size was normal in size.   Right Atrium: Right atrial size was mildly dilated.   Pericardium: Trivial pericardial effusion is present.   Mitral Valve: The mitral valve is normal in structure. Moderate mitral  valve regurgitation. No evidence of mitral valve stenosis.   Tricuspid Valve: The tricuspid valve is normal in structure. Tricuspid  valve regurgitation is moderate.   Aortic Valve: The aortic valve has been repaired/replaced. Aortic valve  regurgitation is not visualized. Aortic valve mean gradient measures 2.0  mmHg. Aortic valve peak gradient measures 5.0 mmHg. Aortic valve area, by  VTI measures 1.33 cm. There is a  29 mm Edwards Sapien prosthetic, stented (TAVR) valve present in the  aortic position. Procedure Date: 05/15/2018.   Pulmonic Valve: The pulmonic valve was not well visualized. Pulmonic valve  regurgitation is not visualized.   Aorta: The aortic root is normal in size and structure and aortic  dilatation noted. There is mild dilatation of the ascending aorta,  measuring 38 mm.   Venous: The inferior vena cava is dilated in size with less than 50%  respiratory variability, suggesting right atrial pressure of 15 mmHg.   IAS/Shunts: The interatrial septum was not well visualized.   Additional Comments: There is a large pleural effusion in the left lateral  region.   Patient Profile     71 y.o. male with history of CAD s/p CABG and PCI, ischemic cardiomyopathy s/p BiV ICD, Chronic LBBB, aortic valve disorder s/p TAVR in 7169, chronic systolic heart failure, hypertension, and hyperlipidemia seen for CHF.  Assessment & Plan    Acute on chronic systolic heart failure: Dietary noncompliance for past 2 months secondary to social situation.  Did not tolerated outpatient increase in Lasix. BNP 1424. Echocardiogram showed persistent low EF at 20 to 25%, mildly reduced RV  function.  -- currently on IV Lasix 40 mg twice daily for diuresis. Net - 2.2L. Still with ongoing dyspnea. Will continue with IV lasix today -- Continue Toprol-XL 100 mg, losartan 25 mg daily, add spiro 12.5mg  daily -- unable to tolerate Entresto in the past 2/2 hypotension  S/p BiV ICD: followed by Dr. Rayann Heman   CAD: last cath 2019 with severe 2v CAD total occlusion of RCA and LCx, patent SVG-OM1, SVG- RPDA. No angina -- Continue aspirin, statin and beta-blocker -- was pending outpatient stress, though suspect he would benefit more for [facility] if he does not improve with diuresis   Aortic Stenosis s/p TAVR '19: Normal functioning valve by echocardiogram    Obstructive sleep apnea: Patient does not have a CPAP at home. Patient felt much better after using CPAP overnight in emergency room. -- Recommending arranging CPAP in outpatient setting.   DM: Hgb A1c 8.3 -- SSI while inpatient -- add Farxgia    TOC CHF clinic following at discharge.   For questions or updates, please contact [place] Please consult [email or web address] for contact info under        Signed, Reino Bellis, NP  09/01/2021, 11:34 AM    Agree with note by Reino Bellis NP-C  Patient admitted with volume overload and dyspnea/systolic heart failure.  He is diuresed 2.2 L.  His peripheral edema has resolved.  He still complains of dyspnea on exertion.  We will continue IV diuresis.  Its been 3 years since his TAVR/cardiac cath.  He did have patent veins to an OM and distal right with diffuse LAD disease at that time.  If he continues to have dyspnea recommend right and left heart cath.  His echo did show moderate MR and TR.  Lorretta Harp, M.D., [place], [facility], [place] [address] [place], [place]  [address]  [phone] 09/01/2021 1:44 PM

## 2021-09-01 NOTE — Progress Notes (Signed)
Mobility Specialist Progress Note: ° ° 09/01/21 1023  °Mobility  °Activity Ambulated with assistance in hallway  °Level of Assistance Standby assist, set-up cues, supervision of patient - no hands on  °Assistive Device None  °Distance Ambulated (ft) 140 ft  °Activity Response Tolerated well  °$Mobility charge 1 Mobility  ° °Pt received in bed willing to participate in mobility, stated he gets SOB when walking. Walked to nurses station and needed to turn around. Pt stated he needed a bathroom and wasn't going to make it back to the room. Pt hunched over and started wheezing, Sat in hallway chair and was wheeled back to the room. Upon returning to the room pt stated this has been going on for a couple months and as soon as he gets SOB he has high urgency to urinate. Left in bed with call bell in reach and all needs met.  ° °[name] °Mobility Specialist °Primary Phone [phone] °Secondary Phone [phone] ° °

## 2021-09-02 LAB — GLUCOSE, CAPILLARY
Glucose-Capillary: 55 mg/dL — ABNORMAL LOW (ref 70–99)
Glucose-Capillary: 117 mg/dL — ABNORMAL HIGH (ref 70–99)
Glucose-Capillary: 211 mg/dL — ABNORMAL HIGH (ref 70–99)
Glucose-Capillary: 152 mg/dL — ABNORMAL HIGH (ref 70–99)
Glucose-Capillary: 236 mg/dL — ABNORMAL HIGH (ref 70–99)
Glucose-Capillary: 167 mg/dL — ABNORMAL HIGH (ref 70–99)

## 2021-09-02 LAB — BASIC METABOLIC PANEL
Anion gap: 9 (ref 5–15)
BUN: 22 mg/dL (ref 8–23)
CO2: 29 mmol/L (ref 22–32)
Calcium: 8.6 mg/dL — ABNORMAL LOW (ref 8.9–10.3)
Chloride: 106 mmol/L (ref 98–111)
Creatinine, Ser: 1.45 mg/dL — ABNORMAL HIGH (ref 0.61–1.24)
GFR, Estimated: 51 mL/min — ABNORMAL LOW (ref >60)
Glucose, Bld: 77 mg/dL (ref 70–99)
Potassium: 3.5 mmol/L (ref 3.5–5.1)
Sodium: 144 mmol/L (ref 135–145)

## 2021-09-02 MED ORDER — POTASSIUM CHLORIDE CRYS ER 20 MEQ PO TBCR
40.0000 meq | EXTENDED_RELEASE_TABLET | Freq: Two times a day (BID) | ORAL | Status: AC
  Administered 2021-09-02 (×2): 40 meq via ORAL
  Filled 2021-09-02 (×2): qty 2

## 2021-09-02 NOTE — Plan of Care (Signed)

## 2021-09-02 NOTE — Progress Notes (Signed)
Heart Failure Stewardship Pharmacist Progress Note   PCP: [name], MD PCP-Cardiologist: Fransico Him, MD    HPI:  71 yo M with PMH of CAD s/p CABG, ICM, LBB, AVR (TAVR), CHF, HTN, and HLD. He presented to the ED on 1/16 following a cardiology appt for shortness of breath and worsened leg edema. CXR significant for bilateral pleural effusions and vascular congestion without overt pulmonary edema. An ECHO was done 1/16 and LVEF was 20-25% (<20% in 12/2019; last time it was >40% was in 10/2015).  Current HF Medications: Diuretic: furosemide 40 mg IV BID Beta Blocker: metoprolol XL 100 mg daily ACE/ARB/ARNI: losartan 25 mg daily Aldosterone Antagonist: spironolactone 12.5 mg daily SGLT2i: Farxiga 10 mg daily  Prior to admission HF Medications: Diuretic: furosemide 40 mg daily Beta blocker: metoprolol XL 100 mg daily ACE/ARB/ARNI: losartan 25 mg daily  Pertinent Lab Values: Serum creatinine 1.45, BUN 22, Potassium 3.5, Sodium 144, BNP 1424.1, A1c 8.3   Vital Signs: Weight: 151 lbs (admission weight: 160 lbs) Blood pressure: 110/70s  Heart rate: 90s  I/O: -3L yesterday; net -5.2L  Medication Assistance / Insurance Benefits Check: Does the patient have prescription insurance?  Yes Type of insurance plan: [place]:  Prior to admission outpatient pharmacy: [facility] Is the patient willing to use [facility] at discharge? Yes Is the patient willing to transition their outpatient pharmacy to utilize a [facility] outpatient pharmacy?   Pending    Assessment: 1. Acute on chronic systolic CHF (EF 31-54%), due to ICM. NYHA class III symptoms. - Continue furosemide 40 mg IV BID - monitor I/O and renal function for titration - Continue metoprolol XL 100 mg daily - Continue losartan 25 mg daily. Had been on Entresto in the past but was stopped due to hypotension - consider rechallenging this admission - Continue spironolactone 12.5 mg daily - Continue  Farxiga 10 mg daily - Off ivabradine 5 mg BID due to cost  Plan: 1) Medication changes recommended at this time: - Continue current regimen   2) Patient assistance: - Patient's initial copay for brand name meds is higher because he hasnt met his deductible yet ($323)  3)  Education  - To be completed prior to discharge  Kerby Nora, PharmD, BCPS Heart Failure Stewardship Pharmacist Phone [phone]

## 2021-09-02 NOTE — Progress Notes (Signed)
Mobility Specialist Progress Note:   09/02/21 1603  Mobility  Activity Ambulated with assistance in hallway  Level of Assistance Independent  Assistive Device None  Distance Ambulated (ft) 520 ft  Activity Response Tolerated well  $Mobility charge 1 Mobility   Pt received walking in room willing to participate in mobility. No complaints of pain and asymptomatic. Pt left EOB with call bell in reach and all needs met.    [facility] Public librarian Phone [phone] Secondary Phone [phone]

## 2021-09-02 NOTE — Progress Notes (Signed)
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  He was sent for a stress test and was seen by cardiology nuclear medicine and was found to be short of breath.  Echo showed EF of 20 to 25% with severely decreased left ventricular function, large left-sided pleural effusion.  Sent to the ED for admission and diuresis.   Assessment & Plan:   Acute on chronic systolic congestive heart failure:  -Echo showed EF of 20 to 25% with severely decreased left ventricular function.Echo on 2021 with same.  -Chest x-ray > small bilateral pleural effusion. -Clinically improving with diuresis, he is 3 L negative, weaned off oxygen -Appears euvolemic now, transition to oral Lasix -Discontinued ivabradine, patient has been unable to afford this and not using it anyway, continue high-dose metoprolol -Cardiology following, previously developed hypotension with Delene Loll, started low-dose Aldactone -Dietitian consult -Discharge planning, home tomorrow if stable  History of coronary artery  disease: Status post CABG. no anginal symptoms.  Troponin minimally elevated, flat trend.  On aspirin, metoprolol.  History of aortic stenosis: Status post TAVR.  Stable as per recent echo  CKD stage IIIb: Baseline creatinine 1.2.  Currently kidney function close to baseline.  Monitor  Hypothyroidism: On Synthyroid  Hypertension: On losartan and metoprolol at home, continued  Hyperlipidemia: Simvastatin  Pancytopenia: Mild  leukopenia, hemoglobin 12.2 with microcytosis.  Continue to monitor.  Insulin-dependent type 2 diabetes: Continue current insulin regimen.  Monitor blood sugars  OSA: Not on cpap, recommend outpatient sleep study   DVT prophylaxis:Lovenox Code Status: Full Family Communication: No family at bedside Patient status:Inpatient  Dispo: The patient is from: Home              Anticipated d/c is to: Home              Anticipated d/c date is: In next 1 to 2 days, needs cardiology clearance  Consultants : Cardiology  Procedures: None  Antimicrobials:  Anti-infectives (From admission, onward)    None       Subjective:  -Breathing improving, not back to baseline  Objective: Vitals:   09/01/21 2025 09/01/21 2237 09/02/21 0425 09/02/21 1056  BP: 115/77 115/65 116/74 103/76  Pulse:  90 98 88  Resp:  20  18  Temp: (!) 97.5 F (36.4 C)  97.8 F (36.6 C) 97.9 F (36.6 C)  TempSrc: Oral  Oral Oral  SpO2: 93% 96% 97% 97%  Weight:   68.8 kg   Height:        Intake/Output Summary (Last 24 hours) at 09/02/2021 1122 Last data filed at 09/02/2021 1032 Gross per 24 hour  Intake 1072 ml  Output 3224 ml  Net -2152 ml   Filed Weights   08/31/21 1634 09/01/21 0332 09/02/21 0425  Weight: 71.1 kg 70.3 kg 68.8 kg    Examination:  General exam: Elderly male sitting up in bed, AAOx3, no distress HEENT: Positive JVD CVS: S1-S2, regular rate rhythm Lungs: Few basilar rales Abdomen: Soft, nontender, bowel sounds present Extremities: Trace edema Skin: No rashes  on exposed skin    Data Reviewed: I have personally reviewed following labs and imaging studies  CBC: Recent Labs  Lab 08/30/21 1215 09/01/21 0351  WBC 3.4* 4.1  NEUTROABS 2.1 2.3  HGB 12.2* 12.4*  HCT 38.9* 39.1  MCV 77.5* 76.8*  PLT 247 992   Basic Metabolic Panel: Recent Labs  Lab 08/30/21 0832 08/30/21 1215 08/31/21 0304 09/01/21 0351 09/02/21 0317  NA 142 139 140 139 144  K 4.7 4.5 3.7 4.4 3.5  CL 106  109 104 104 106  CO2 24 21* 25 26 29   GLUCOSE 265* 250* 295* 177* 77  BUN 24 23 21 23 22   CREATININE 1.39* 1.33* 1.36* 1.39* 1.45*  CALCIUM 9.4 8.4* 8.9 8.5* 8.6*   GFR: Estimated Creatinine Clearance: 44.8 mL/min (A) (by C-G formula based on SCr of 1.45 mg/dL (H)). Liver Function Tests: Recent Labs  Lab 08/30/21 0832  AST 34  ALT 32  ALKPHOS 101  BILITOT 0.7  PROT 7.2  ALBUMIN 4.1   No results for input(s): LIPASE, AMYLASE in the last 168 hours. No results for input(s): AMMONIA in the last 168 hours. Coagulation Profile: No results for input(s): INR, PROTIME in the last 168 hours. Cardiac Enzymes: No results for input(s): CKTOTAL, CKMB, CKMBINDEX, TROPONINI in the last 168 hours. BNP (last 3 results) No results for input(s): PROBNP in the last 8760 hours. HbA1C: Recent Labs    08/31/21 0305  HGBA1C 8.3*   CBG: Recent Labs  Lab 09/01/21 2118 09/02/21 0421 09/02/21 0446 09/02/21 0611 09/02/21 1055  GLUCAP 263* 55* 117* 211* 152*   Lipid Profile: No results for input(s): CHOL, HDL, LDLCALC, TRIG, CHOLHDL, LDLDIRECT in the last 72 hours.  Thyroid Function Tests: No results for input(s): TSH, T4TOTAL, FREET4, T3FREE, THYROIDAB in the last 72 hours. Anemia Panel: No results for input(s): VITAMINB12, FOLATE, FERRITIN, TIBC, IRON, RETICCTPCT in the last 72 hours. Sepsis Labs: No results for input(s): PROCALCITON, LATICACIDVEN in the last 168 hours.  Recent Results (from the past 240 hour(s))  Resp Panel by RT-PCR (Flu A&B, Covid) Nasopharyngeal Swab     Status: None   Collection Time: 08/30/21  4:37 PM   Specimen: Nasopharyngeal Swab; Nasopharyngeal(NP) swabs in vial transport medium  Result Value Ref Range Status   SARS Coronavirus 2 by RT PCR NEGATIVE NEGATIVE Final    Comment: (NOTE) SARS-CoV-2 target nucleic acids are NOT DETECTED.  The SARS-CoV-2 RNA is generally detectable in upper respiratory specimens during the acute phase of infection. The  lowest concentration of SARS-CoV-2 viral copies this assay can detect is 138 copies/mL. A negative result does not preclude SARS-Cov-2 infection and should not be used as the sole basis for treatment or other patient management decisions. A negative result may occur with  improper specimen collection/handling, submission of specimen other than nasopharyngeal swab, presence of viral mutation(s) within the areas targeted by this assay, and inadequate number of viral copies(<138 copies/mL). A negative result must be combined with clinical observations, patient history, and epidemiological information. The expected result is Negative.  Fact Sheet for Patients:  EntrepreneurPulse.com.au  Fact Sheet for Healthcare Providers:  IncredibleEmployment.be  This test is no t yet approved or cleared by the [place] FDA and  has been authorized for detection and/or diagnosis of SARS-CoV-2 by FDA under an Emergency Use Authorization (EUA). This EUA will remain  in effect (meaning this test can be used) for the duration of the COVID-19 declaration under Section 564(b)(1) of the Act, 21 U.S.C.section 360bbb-3(b)(1),  unless the authorization is terminated  or revoked sooner.       Influenza A by PCR NEGATIVE NEGATIVE Final   Influenza B by PCR NEGATIVE NEGATIVE Final    Comment: (NOTE) The Xpert Xpress SARS-CoV-2/FLU/RSV plus assay is intended as an aid in the diagnosis of influenza from Nasopharyngeal swab specimens and should not be used as a sole basis for treatment. Nasal washings and aspirates are unacceptable for Xpert Xpress SARS-CoV-2/FLU/RSV testing.  Fact Sheet for Patients: EntrepreneurPulse.com.au  Fact Sheet for Healthcare Providers: IncredibleEmployment.be  This test is not yet approved or cleared by the [place] FDA and has been authorized for detection and/or diagnosis of SARS-CoV-2 by FDA under  an Emergency Use Authorization (EUA). This EUA will remain in effect (meaning this test can be used) for the duration of the COVID-19 declaration under Section 564(b)(1) of the Act, 21 U.S.C. section 360bbb-3(b)(1), unless the authorization is terminated or revoked.  Performed at [facility], [place] [address], [place], [place] [address]      Scheduled Meds:  amitriptyline  25 mg Oral QHS   aspirin EC  81 mg Oral Daily   dapagliflozin propanediol  10 mg Oral Daily   enoxaparin (LOVENOX) injection  40 mg Subcutaneous Q24H   insulin aspart  0-15 Units Subcutaneous TID WC   insulin detemir  10 Units Subcutaneous BID   levothyroxine  50 mcg Oral QAC breakfast   losartan  25 mg Oral Daily   metoprolol succinate  100 mg Oral Daily   omega-3 acid ethyl esters  1 g Oral Daily   potassium chloride  40 mEq Oral BID   simvastatin  20 mg Oral QPM   spironolactone  12.5 mg Oral Daily   zolpidem  2.5 mg Oral QHS   Continuous Infusions:   LOS: 3 days    Domenic Polite, MD [facility] P1/19/2023, 11:22 AM

## 2021-09-02 NOTE — Progress Notes (Addendum)
Progress Note  Patient Name: [name] Date of Encounter: 09/02/2021  [facility] Cardiologist: Fransico Him, MD   Subjective   Feeling much better today. No longer on O2.  Inpatient Medications    Scheduled Meds:  amitriptyline  25 mg Oral QHS   aspirin EC  81 mg Oral Daily   dapagliflozin propanediol  10 mg Oral Daily   enoxaparin (LOVENOX) injection  40 mg Subcutaneous Q24H   insulin aspart  0-15 Units Subcutaneous TID WC   insulin detemir  10 Units Subcutaneous BID   levothyroxine  50 mcg Oral QAC breakfast   losartan  25 mg Oral Daily   metoprolol succinate  100 mg Oral Daily   omega-3 acid ethyl esters  1 g Oral Daily   potassium chloride  40 mEq Oral BID   simvastatin  20 mg Oral QPM   spironolactone  12.5 mg Oral Daily   zolpidem  2.5 mg Oral QHS   Continuous Infusions:  PRN Meds: albuterol   Vital Signs    Vitals:   09/01/21 1644 09/01/21 2025 09/01/21 2237 09/02/21 0425  BP: 116/63 115/77 115/65 116/74  Pulse: 80  90 98  Resp: 18  20   Temp: 97.6 F (36.4 C) (!) 97.5 F (36.4 C)  97.8 F (36.6 C)  TempSrc: Axillary Oral  Oral  SpO2: 96% 93% 96% 97%  Weight:    68.8 kg  Height:        Intake/Output Summary (Last 24 hours) at 09/02/2021 0931 Last data filed at 09/02/2021 0859 Gross per 24 hour  Intake 1072 ml  Output 2824 ml  Net -1752 ml   Last 3 Weights 09/02/2021 09/01/2021 08/31/2021  Weight (lbs) 151 lb 11.2 oz 154 lb 14.4 oz 156 lb 12 oz  Weight (kg) 68.811 kg 70.262 kg 71.1 kg      Telemetry    SR - Personally Reviewed  ECG    No new tracing  Physical Exam   GEN: No acute distress.   Neck: No JVD Cardiac: RRR, no murmurs, rubs, or gallops.  Respiratory: Clear to auscultation bilaterally. GI: Soft, nontender, non-distended  MS: No edema; No deformity. Neuro:  Nonfocal  Psych: Normal affect   Labs    High Sensitivity Troponin:   Recent Labs  Lab 08/30/21 1215 08/30/21 1409  TROPONINIHS 17 17      Chemistry Recent Labs  Lab 08/30/21 0832 08/30/21 1215 08/31/21 0304 09/01/21 0351 09/02/21 0317  NA 142   < > 140 139 144  K 4.7   < > 3.7 4.4 3.5  CL 106   < > 104 104 106  CO2 24   < > 25 26 29   GLUCOSE 265*   < > 295* 177* 77  BUN 24   < > 21 23 22   CREATININE 1.39*   < > 1.36* 1.39* 1.45*  CALCIUM 9.4   < > 8.9 8.5* 8.6*  PROT 7.2  --   --   --   --   ALBUMIN 4.1  --   --   --   --   AST 34  --   --   --   --   ALT 32  --   --   --   --   ALKPHOS 101  --   --   --   --   BILITOT 0.7  --   --   --   --   GFRNONAA  --    < >  55* 54* 51*  ANIONGAP  --    < > 11 9 9    < > = values in this interval not displayed.    Lipids  Recent Labs  Lab 08/30/21 0832  CHOL 91*  TRIG 39  HDL 36*  LABVLDL 11  LDLCALC 44  CHOLHDL 2.5    Hematology Recent Labs  Lab 08/30/21 1215 09/01/21 0351  WBC 3.4* 4.1  RBC 5.02 5.09  HGB 12.2* 12.4*  HCT 38.9* 39.1  MCV 77.5* 76.8*  MCH 24.3* 24.4*  MCHC 31.4 31.7  RDW 16.3* 16.2*  PLT 247 248   Thyroid No results for input(s): TSH, FREET4 in the last 168 hours.  BNP Recent Labs  Lab 08/30/21 0832 08/30/21 1215  BNP 1,931.7* 1,424.1*    DDimer No results for input(s): DDIMER in the last 168 hours.   Radiology    No results found.  Cardiac Studies   Echo: 08/30/2021   IMPRESSIONS     1. Left ventricular ejection fraction, by estimation, is 20 to 25%. The  left ventricle has severely decreased function. The left ventricle  demonstrates global hypokinesis. There is mild left ventricular  hypertrophy. Left ventricular diastolic parameters   are indeterminate.   2. Right ventricular systolic function is mildly reduced. The right  ventricular size is moderately enlarged. There is moderately elevated  pulmonary artery systolic pressure. The estimated right ventricular  systolic pressure is 10.9 mmHg.   3. Right atrial size was mildly dilated.   4. Large pleural effusion in the left lateral region.   5. The mitral  valve is normal in structure. Moderate mitral valve  regurgitation. No evidence of mitral stenosis.   6. Tricuspid valve regurgitation is moderate.   7. There is a 29 mm Edwards Sapien prosthetic (TAVR) valve present in the  aortic position      Aortic valve regurgitation is not visualized. Aortic valve mean  gradient measures 2.0 mmHg.   8. Aortic dilatation noted. There is mild dilatation of the ascending  aorta, measuring 38 mm.   9. The inferior vena cava is dilated in size with <50% respiratory  variability, suggesting right atrial pressure of 15 mmHg.   FINDINGS   Left Ventricle: Left ventricular ejection fraction, by estimation, is 20  to 25%. The left ventricle has severely decreased function. The left  ventricle demonstrates global hypokinesis. The left ventricular internal  cavity size was normal in size. There  is mild left ventricular hypertrophy. Left ventricular diastolic  parameters are indeterminate.   Right Ventricle: The right ventricular size is moderately enlarged. Right  vetricular wall thickness was not well visualized. Right ventricular  systolic function is mildly reduced. There is moderately elevated  pulmonary artery systolic pressure. The  tricuspid regurgitant velocity is 2.98 m/s, and with an assumed right  atrial pressure of 15 mmHg, the estimated right ventricular systolic  pressure is 32.3 mmHg.   Left Atrium: Left atrial size was normal in size.   Right Atrium: Right atrial size was mildly dilated.   Pericardium: Trivial pericardial effusion is present.   Mitral Valve: The mitral valve is normal in structure. Moderate mitral  valve regurgitation. No evidence of mitral valve stenosis.   Tricuspid Valve: The tricuspid valve is normal in structure. Tricuspid  valve regurgitation is moderate.   Aortic Valve: The aortic valve has been repaired/replaced. Aortic valve  regurgitation is not visualized. Aortic valve mean gradient measures 2.0  mmHg.  Aortic valve peak gradient measures 5.0 mmHg. Aortic valve  area, by  VTI measures 1.33 cm. There is a  29 mm Edwards Sapien prosthetic, stented (TAVR) valve present in the  aortic position. Procedure Date: 05/15/2018.   Pulmonic Valve: The pulmonic valve was not well visualized. Pulmonic valve  regurgitation is not visualized.   Aorta: The aortic root is normal in size and structure and aortic  dilatation noted. There is mild dilatation of the ascending aorta,  measuring 38 mm.   Venous: The inferior vena cava is dilated in size with less than 50%  respiratory variability, suggesting right atrial pressure of 15 mmHg.   IAS/Shunts: The interatrial septum was not well visualized.   Additional Comments: There is a large pleural effusion in the left lateral  region.   Patient Profile     71 y.o. male with history of CAD s/p CABG and PCI, ischemic cardiomyopathy s/p BiV ICD, Chronic LBBB, aortic valve disorder s/p TAVR in 9371, chronic systolic heart failure, hypertension, and hyperlipidemia seen for CHF.  Assessment & Plan    Acute on chronic systolic heart failure: Dietary noncompliance for past 2 months secondary to social situation.  Did not tolerated outpatient increase in Lasix. BNP 1424. Echocardiogram showed persistent low EF at 20 to 25%, mildly reduced RV function.  -- received IV Lasix 40 mg twice daily for diuresis. Net - 3.3L, 2.7L UOP yesterday. Feeling much better today. Will stop IV lasix, plan to resume on 40mg  daily PO tomorrow -- Continue Toprol-XL 100 mg, losartan 25 mg daily, spiro 12.5mg  daily -- unable to tolerate Entresto in the past 2/2 hypotension   S/p BiV ICD: followed by Dr. Rayann Heman   CAD: last cath 2019 with severe 2v CAD total occlusion of RCA and LCx, patent SVG-OM1, SVG- RPDA. No angina -- Continue aspirin, statin and beta-blocker   Aortic Stenosis s/p TAVR '19: Normal functioning valve by echocardiogram    Obstructive sleep apnea: Patient does not  have a CPAP at home. Patient felt much better after using CPAP overnight in emergency room. -- will need referral for OSA at discharge. Reports having had sleep study in the past and machine but did not use.    DM: Hgb A1c 8.3 -- SSI while inpatient -- continue Farxgia    HLD: LDL 44 -- on statin  Needs to ambulate today. Recheck BMET tomorrow morning. Suspect will be ready for DC tomorrow. TOC CHF clinic following at discharge.   For questions or updates, please contact [place] Please consult [email or web address] for contact info under        Signed, Reino Bellis, NP  09/02/2021, 9:32 AM    Agree with note by Reino Bellis NP-C  Feeling better today after additional diuresis of 3.3 L.  We will convert to oral diuretics, encourage ambulation.  Denies chest pain.  Renal function stable.  Anticipate discharge tomorrow.  Lorretta Harp, M.D., [place], [facility], [place] [address] [place],   [address]  [phone] 09/02/2021 11:07 AM

## 2021-09-03 ENCOUNTER — Ambulatory Visit: Payer: Medicare Other | Admitting: Cardiology

## 2021-09-03 ENCOUNTER — Other Ambulatory Visit (HOSPITAL_COMMUNITY): Payer: Self-pay

## 2021-09-03 LAB — GLUCOSE, CAPILLARY
Glucose-Capillary: 109 mg/dL — ABNORMAL HIGH (ref 70–99)
Glucose-Capillary: 226 mg/dL — ABNORMAL HIGH (ref 70–99)

## 2021-09-03 LAB — BASIC METABOLIC PANEL
Anion gap: 9 (ref 5–15)
BUN: 20 mg/dL (ref 8–23)
CO2: 28 mmol/L (ref 22–32)
Calcium: 9 mg/dL (ref 8.9–10.3)
Chloride: 106 mmol/L (ref 98–111)
Creatinine, Ser: 1.49 mg/dL — ABNORMAL HIGH (ref 0.61–1.24)
GFR, Estimated: 50 mL/min — ABNORMAL LOW (ref >60)
Glucose, Bld: 158 mg/dL — ABNORMAL HIGH (ref 70–99)
Potassium: 4.2 mmol/L (ref 3.5–5.1)
Sodium: 143 mmol/L (ref 135–145)

## 2021-09-03 MED ORDER — DAPAGLIFLOZIN PROPANEDIOL 10 MG PO TABS
10.0000 mg | ORAL_TABLET | Freq: Every day | ORAL | 0 refills | Status: DC
Start: 2021-09-04 — End: 2021-10-11
  Filled 2021-09-03: qty 30, 30d supply, fill #0

## 2021-09-03 MED ORDER — FUROSEMIDE 40 MG PO TABS
40.0000 mg | ORAL_TABLET | Freq: Every day | ORAL | Status: DC
  Administered 2021-09-03: 40 mg via ORAL
  Filled 2021-09-03: qty 1

## 2021-09-03 MED ORDER — ASPIRIN 81 MG PO TBEC: 81.0000 mg | DELAYED_RELEASE_TABLET | Freq: Every day | ORAL | 11 refills | Status: DC

## 2021-09-03 MED ORDER — SPIRONOLACTONE 25 MG PO TABS
12.5000 mg | ORAL_TABLET | Freq: Every day | ORAL | 0 refills | Status: DC
  Filled 2021-09-03: qty 30, 60d supply, fill #0

## 2021-09-03 MED ORDER — FUROSEMIDE 20 MG PO TABS
40.0000 mg | ORAL_TABLET | Freq: Every day | ORAL | 1 refills | Status: DC
  Filled 2021-09-03: qty 60, 30d supply, fill #0

## 2021-09-03 NOTE — Progress Notes (Signed)
Progress Note  Patient Name: [name] Date of Encounter: 09/03/2021  [facility] Cardiologist: Fransico Him, MD   Subjective   Feeling much better today. No longer on O2.  Walked up and down the hallway without limitation or symptoms.  Inpatient Medications    Scheduled Meds:  amitriptyline  25 mg Oral QHS   aspirin EC  81 mg Oral Daily   dapagliflozin propanediol  10 mg Oral Daily   enoxaparin (LOVENOX) injection  40 mg Subcutaneous Q24H   furosemide  40 mg Oral Daily   insulin aspart  0-15 Units Subcutaneous TID WC   insulin detemir  10 Units Subcutaneous BID   levothyroxine  50 mcg Oral QAC breakfast   losartan  25 mg Oral Daily   metoprolol succinate  100 mg Oral Daily   omega-3 acid ethyl esters  1 g Oral Daily   simvastatin  20 mg Oral QPM   spironolactone  12.5 mg Oral Daily   zolpidem  2.5 mg Oral QHS   Continuous Infusions:  PRN Meds: albuterol   Vital Signs    Vitals:   09/02/21 1924 09/02/21 2157 09/02/21 2208 09/03/21 0316  BP: 101/71   111/76  Pulse:  93 96 85  Resp: 16 18 18 17   Temp: 97.9 F (36.6 C)   (!) 97.3 F (36.3 C)  TempSrc: Oral     SpO2: 100% 100% 100% 94%  Weight:      Height:        Intake/Output Summary (Last 24 hours) at 09/03/2021 0948 Last data filed at 09/03/2021 0917 Gross per 24 hour  Intake 960 ml  Output 3680 ml  Net -2720 ml   Last 3 Weights 09/02/2021 09/01/2021 08/31/2021  Weight (lbs) 151 lb 11.2 oz 154 lb 14.4 oz 156 lb 12 oz  Weight (kg) 68.811 kg 70.262 kg 71.1 kg      Telemetry    SR - Personally Reviewed  ECG    No new tracing  Physical Exam   GEN: No acute distress.   Neck: No JVD Cardiac: RRR, no murmurs, rubs, or gallops.  Respiratory: Clear to auscultation bilaterally. GI: Soft, nontender, non-distended  MS: No edema; No deformity. Neuro:  Nonfocal  Psych: Normal affect   Labs    High Sensitivity Troponin:   Recent Labs  Lab 08/30/21 1215 08/30/21 1409  TROPONINIHS 17 17      Chemistry Recent Labs  Lab 08/30/21 0832 08/30/21 1215 09/01/21 0351 09/02/21 0317 09/03/21 0312  NA 142   < > 139 144 143  K 4.7   < > 4.4 3.5 4.2  CL 106   < > 104 106 106  CO2 24   < > 26 29 28   GLUCOSE 265*   < > 177* 77 158*  BUN 24   < > 23 22 20   CREATININE 1.39*   < > 1.39* 1.45* 1.49*  CALCIUM 9.4   < > 8.5* 8.6* 9.0  PROT 7.2  --   --   --   --   ALBUMIN 4.1  --   --   --   --   AST 34  --   --   --   --   ALT 32  --   --   --   --   ALKPHOS 101  --   --   --   --   BILITOT 0.7  --   --   --   --  GFRNONAA  --    < > 54* 51* 50*  ANIONGAP  --    < > 9 9 9    < > = values in this interval not displayed.    Lipids  Recent Labs  Lab 08/30/21 0832  CHOL 91*  TRIG 39  HDL 36*  LABVLDL 11  LDLCALC 44  CHOLHDL 2.5    Hematology Recent Labs  Lab 08/30/21 1215 09/01/21 0351  WBC 3.4* 4.1  RBC 5.02 5.09  HGB 12.2* 12.4*  HCT 38.9* 39.1  MCV 77.5* 76.8*  MCH 24.3* 24.4*  MCHC 31.4 31.7  RDW 16.3* 16.2*  PLT 247 248   Thyroid No results for input(s): TSH, FREET4 in the last 168 hours.  BNP Recent Labs  Lab 08/30/21 0832 08/30/21 1215  BNP 1,931.7* 1,424.1*    DDimer No results for input(s): DDIMER in the last 168 hours.   Radiology    No results found.  Cardiac Studies   Echo: 08/30/2021   IMPRESSIONS     1. Left ventricular ejection fraction, by estimation, is 20 to 25%. The  left ventricle has severely decreased function. The left ventricle  demonstrates global hypokinesis. There is mild left ventricular  hypertrophy. Left ventricular diastolic parameters   are indeterminate.   2. Right ventricular systolic function is mildly reduced. The right  ventricular size is moderately enlarged. There is moderately elevated  pulmonary artery systolic pressure. The estimated right ventricular  systolic pressure is 24.4 mmHg.   3. Right atrial size was mildly dilated.   4. Large pleural effusion in the left lateral region.   5. The mitral valve  is normal in structure. Moderate mitral valve  regurgitation. No evidence of mitral stenosis.   6. Tricuspid valve regurgitation is moderate.   7. There is a 29 mm Edwards Sapien prosthetic (TAVR) valve present in the  aortic position      Aortic valve regurgitation is not visualized. Aortic valve mean  gradient measures 2.0 mmHg.   8. Aortic dilatation noted. There is mild dilatation of the ascending  aorta, measuring 38 mm.   9. The inferior vena cava is dilated in size with <50% respiratory  variability, suggesting right atrial pressure of 15 mmHg.   FINDINGS   Left Ventricle: Left ventricular ejection fraction, by estimation, is 20  to 25%. The left ventricle has severely decreased function. The left  ventricle demonstrates global hypokinesis. The left ventricular internal  cavity size was normal in size. There  is mild left ventricular hypertrophy. Left ventricular diastolic  parameters are indeterminate.   Right Ventricle: The right ventricular size is moderately enlarged. Right  vetricular wall thickness was not well visualized. Right ventricular  systolic function is mildly reduced. There is moderately elevated  pulmonary artery systolic pressure. The  tricuspid regurgitant velocity is 2.98 m/s, and with an assumed right  atrial pressure of 15 mmHg, the estimated right ventricular systolic  pressure is 01.0 mmHg.   Left Atrium: Left atrial size was normal in size.   Right Atrium: Right atrial size was mildly dilated.   Pericardium: Trivial pericardial effusion is present.   Mitral Valve: The mitral valve is normal in structure. Moderate mitral  valve regurgitation. No evidence of mitral valve stenosis.   Tricuspid Valve: The tricuspid valve is normal in structure. Tricuspid  valve regurgitation is moderate.   Aortic Valve: The aortic valve has been repaired/replaced. Aortic valve  regurgitation is not visualized. Aortic valve mean gradient measures 2.0  mmHg. Aortic  valve peak gradient measures 5.0 mmHg. Aortic valve area, by  VTI measures 1.33 cm. There is a  29 mm Edwards Sapien prosthetic, stented (TAVR) valve present in the  aortic position. Procedure Date: 05/15/2018.   Pulmonic Valve: The pulmonic valve was not well visualized. Pulmonic valve  regurgitation is not visualized.   Aorta: The aortic root is normal in size and structure and aortic  dilatation noted. There is mild dilatation of the ascending aorta,  measuring 38 mm.   Venous: The inferior vena cava is dilated in size with less than 50%  respiratory variability, suggesting right atrial pressure of 15 mmHg.   IAS/Shunts: The interatrial septum was not well visualized.   Additional Comments: There is a large pleural effusion in the left lateral  region.   Patient Profile     71 y.o. male with history of CAD s/p CABG and PCI, ischemic cardiomyopathy s/p BiV ICD, Chronic LBBB, aortic valve disorder s/p TAVR in 7972, chronic systolic heart failure, hypertension, and hyperlipidemia seen for CHF.  Assessment & Plan    Acute on chronic systolic heart failure: Dietary noncompliance for past 2 months secondary to social situation.  Did not tolerated outpatient increase in Lasix. BNP 1424. Echocardiogram showed persistent low EF at 20 to 25%, mildly reduced RV function.  -- received IV Lasix 40 mg twice daily for diuresis. Net - 6.3L . Feeling much better today. Will stop IV lasix, plan to resume on 40mg  daily PO tomorrow -- Continue Toprol-XL 100 mg, losartan 25 mg daily, spiro 12.5mg  daily -- unable to tolerate Entresto in the past 2/2 hypotension   S/p BiV ICD: followed by Dr. Rayann Heman   CAD: last cath 2019 with severe 2v CAD total occlusion of RCA and LCx, patent SVG-OM1, SVG- RPDA. No angina -- Continue aspirin, statin and beta-blocker   Aortic Stenosis s/p TAVR '19: Normal functioning valve by echocardiogram    Obstructive sleep apnea: Patient does not have a CPAP at home. Patient  felt much better after using CPAP overnight in emergency room. -- will need referral for OSA at discharge. Reports having had sleep study in the past and machine but did not use.    DM: Hgb A1c 8.3 -- SSI while inpatient -- continue Farxgia    HLD: LDL 44 -- on statin  Patient has done well with ambulation.  He is net -6.3 L.  He is on p.o. Lasix and spironolactone.  [place] Glade was started as well.  He no longer complains of shortness of breath.  His renal function has been stable over the last several days with only minimal worsening.  He does have a heart failure TOC appointment on Monday and an APP follow-up on 09/22/2021.  I believe he stable for discharge.  For questions or updates, please contact [place] Please consult [email or web address] for contact info under        Signed, Quay Burow, MD  09/03/2021, 9:48 AM

## 2021-09-03 NOTE — Progress Notes (Signed)
Heart Failure Stewardship Pharmacist Progress Note   PCP: [name], MD PCP-Cardiologist: Fransico Him, MD    HPI:  71 yo M with PMH of CAD s/p CABG, ICM, LBB, AVR (TAVR), CHF, HTN, and HLD. He presented to the ED on 1/16 following a cardiology appt for shortness of breath and worsened leg edema. CXR significant for bilateral pleural effusions and vascular congestion without overt pulmonary edema. An ECHO was done 1/16 and LVEF was 20-25% (<20% in 12/2019; last time it was >40% was in 10/2015).  Current HF Medications: Diuretic: furosemide 40 mg PO daily Beta Blocker: metoprolol XL 100 mg daily ACE/ARB/ARNI: losartan 25 mg daily Aldosterone Antagonist: spironolactone 12.5 mg daily SGLT2i: Farxiga 10 mg daily  Prior to admission HF Medications: Diuretic: furosemide 40 mg daily Beta blocker: metoprolol XL 100 mg daily ACE/ARB/ARNI: losartan 25 mg daily  Pertinent Lab Values: Serum creatinine 1.49, BUN 20, Potassium 4.2, Sodium 143, BNP 1424.1, A1c 8.3   Vital Signs: Weight: 151 lbs (admission weight: 160 lbs) Blood pressure: 110/70s  Heart rate: 90s  I/O: -2.9L yesterday; net -6L  Medication Assistance / Insurance Benefits Check: Does the patient have prescription insurance?  Yes Type of insurance plan: [place]:  Prior to admission outpatient pharmacy: [facility] Is the patient willing to use [facility] at discharge? Yes Is the patient willing to transition their outpatient pharmacy to utilize a [facility] outpatient pharmacy?   Pending    Assessment: 1. Acute on chronic systolic CHF (EF 73-40%), due to ICM. NYHA class III symptoms. - Agree with transitioning to furosemide 40 mg PO daily - Continue metoprolol XL 100 mg daily - Continue losartan 25 mg daily. Had been on Entresto in the past but was stopped due to hypotension - consider rechallenging this admission - Continue spironolactone 12.5 mg daily - consider increasing to 25 mg daily   - Continue Farxiga 10 mg daily - Off ivabradine 5 mg BID due to cost  Plan: 1) Medication changes recommended at this time: - Increase spironolactone to 25 mg daily   2) Patient assistance: - Patient's initial copay for brand name meds is higher because he hasnt met his deductible yet ($323)  3)  Education  - To be completed prior to discharge  Kerby Nora, PharmD, BCPS Heart Failure Stewardship Pharmacist Phone [phone]

## 2021-09-03 NOTE — Discharge Summary (Signed)
Physician Discharge Summary  [name] ZOX:[id number] DOB: 12-17-1950 DOA: 08/30/2021  PCP: [name], MD  Admit date: 08/30/2021 Discharge date: 09/03/2021  Time spent: 35 minutes  Recommendations for Outpatient Follow-up:  Heart failure, TOC clinic on 1/23 Cardiology, [facility] heart care on 2/8 Needs BMP in 1 week   Discharge Diagnoses:  Principal Problem:   Acute on chronic systolic CHF (congestive heart failure) (HCC)   DM (diabetes mellitus) (HCC)   Dyslipidemia   Hypertension   SOB (shortness of breath)   CAD (coronary artery disease), native coronary artery   S/P TAVR (transcatheter aortic valve replacement)   Discharge Condition: Stable  Diet recommendation: Low-sodium, heart healthy  Filed Weights   08/31/21 1634 09/01/21 0332 09/02/21 0425  Weight: 71.1 kg 70.3 kg 68.8 kg    History of present illness:   70/M w/ CAD, CABG, chronic systolic CHF EF of 81%, status post AICD, aortic stenosis status post TAVR, 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  He was sent for a stress test and was seen by cardiology nuclear medicine and was found to be short of breath.  Echo showed EF of 20 to 25% with severely decreased left ventricular function, large left-sided pleural effusion.  Sent to the ED for admission and diuresis.   Hospital Course:   Acute on chronic systolic congestive heart failure:  -Echo showed EF of 20 to 25% with severely decreased left ventricular function.Echo on 2021 with same.  -Chest x-ray > small bilateral pleural effusion. -Clinically improving with diuresis, he is 4.5 L negative, weaned off oxygen -Appears euvolemic now, transitioned to oral Lasix -Discontinued ivabradine, patient has been unable to afford this and  not using it anyway, continue high-dose metoprolol -Cardiology following, previously developed hypotension with Delene Loll, started low-dose Aldactone, Farxiga -Diet education provided -Discharged home today in a stable condition, has a follow-up at the heart failure clinic on 1/23   History of coronary artery  disease: Status post CABG. no anginal symptoms.  Troponin minimally elevated, flat trend.  On aspirin, metoprolol, statin   History of aortic stenosis: Status post TAVR.  Stable as per recent echo   CKD stage IIIb: Baseline creatinine 1.2.  Currently kidney function close to baseline.  Monitor   Hypothyroidism: On Synthyroid   Hypertension: On losartan and metoprolol at home, continued   Hyperlipidemia: Simvastatin   Pancytopenia: Mild leukopenia, hemoglobin 12.2 with microcytosis.  Continue to monitor.   Insulin-dependent type 2 diabetes: Continue current insulin regimen.  Monitor blood sugars     Consultations: Cardiology  Discharge Exam: Vitals:   09/03/21 0316 09/03/21 1109  BP: 111/76 111/75  Pulse: 85 93  Resp: 17 18  Temp: (!) 97.3 F (36.3 C) 97.8 F (36.6 C)  SpO2: 94% 100%  Gen: Awake, Alert, Oriented X 3,  HEENT: no JVD Lungs: Good air movement bilaterally, CTAB CVS: S1S2/RRR Abd: soft, Non tender, non distended, BS present Extremities: Trace ankle edema Skin: no new rashes on exposed skin   Discharge Instructions   Discharge Instructions     Diet - low sodium heart healthy   Complete by: As directed    Increase activity slowly   Complete by: As directed       Allergies as of 09/03/2021       Reactions  Atorvastatin Other (See Comments)   Muscle pain Other reaction(s): cramps   Entresto [sacubitril-valsartan] Other (See Comments)   hypotension   Adhesive [tape] Rash        Medication List     STOP taking these medications    amoxicillin 500 MG capsule Commonly known as: AMOXIL   ivabradine 5 MG Tabs tablet Commonly known  as: Corlanor       TAKE these medications    amitriptyline 25 MG tablet Commonly known as: ELAVIL Take 25 mg by mouth at bedtime.   aspirin 81 MG EC tablet Take 1 tablet (81 mg total) by mouth daily. Swallow whole. Start taking on: September 04, 2021   B-D ULTRAFINE III SHORT PEN 31G X 8 MM Misc Generic drug: Insulin Pen Needle 1 each by Other route daily as needed (GLUCOSE TESTING (INSULINE NEEDLE)).   dapagliflozin propanediol 10 MG Tabs tablet Commonly known as: FARXIGA Take 1 tablet (10 mg total) by mouth daily. Start taking on: September 04, 2021   FISH OIL PO Take 1 tablet by mouth daily.   furosemide 20 MG tablet Commonly known as: LASIX Take 2 tablets (40 mg total) by mouth daily.   insulin detemir 100 UNIT/ML injection Commonly known as: LEVEMIR Inject 10-18 Units into the skin See admin instructions. 18 units in the morning, 10 units at bedtime   insulin lispro 100 UNIT/ML KwikPen Commonly known as: HUMALOG Inject 2-3 Units into the skin See admin instructions. Per sliding scale at Breakfast and Dinner   levothyroxine 50 MCG tablet Commonly known as: SYNTHROID Take 50 mcg by mouth daily before breakfast.   losartan 25 MG tablet Commonly known as: COZAAR TAKE 1 TABLET(25 MG) BY MOUTH DAILY What changed: See the new instructions.   metFORMIN 500 MG tablet Commonly known as: GLUCOPHAGE Take 2 tablets (1,000 mg total) by mouth 2 (two) times daily with a meal.   metoprolol succinate 100 MG 24 hr tablet Commonly known as: TOPROL-XL Take 1 tablet (100 mg total) by mouth daily. Take with or immediately following a meal. What changed: additional instructions   multivitamin tablet Take 1 tablet by mouth daily.   ONE TOUCH ULTRA TEST test strip Generic drug: glucose blood 1 each by Other route as needed for other.   simvastatin 20 MG tablet Commonly known as: ZOCOR Take 20 mg by mouth every evening.   spironolactone 25 MG tablet Commonly known as:  ALDACTONE Take 0.5 tablets (12.5 mg total) by mouth daily. Start taking on: September 04, 2021   SUPER B COMPLEX PO Take 1 tablet by mouth daily.   zolpidem 5 MG tablet Commonly known as: AMBIEN Take 2.5 mg by mouth at bedtime.       Allergies  Allergen Reactions   Atorvastatin Other (See Comments)    Muscle pain Other reaction(s): cramps   Entresto [Sacubitril-Valsartan] Other (See Comments)    hypotension   Adhesive [Tape] Rash    Follow-up Information     [facility]. Go to.   Specialty: Cardiology Why: Monday, January 23 @ noon for [facility] clinic within [place].  Bring all medications with you.  FREE valet parking at [employer], off [address]. Contact information: [address] [id number] [place] [phone]                 The results of significant diagnostics from this hospitalization (including imaging, microbiology, ancillary and laboratory) are listed below  for reference.    Significant Diagnostic Studies: DG Chest 2 View  Result Date: 08/30/2021 CLINICAL DATA:  Chest pain. Shortness of breath. History of congestive heart failure, hypertension and diabetes. EXAM: CHEST - 2 VIEW COMPARISON:  Radiographs 02/12/2020.  CT 05/05/2021. FINDINGS: Left subclavian AICD leads appear unchanged. The heart size and mediastinal contours are stable status post median sternotomy, CABG and TAVR. There are new small bilateral pleural effusions with associated bibasilar atelectasis superimposed on chronic left lower lobe scarring. Mild vascular congestion is present without overt pulmonary edema. There is no pneumothorax or confluent airspace opacity. The bones appear unchanged. IMPRESSION: New small bilateral pleural effusions with associated bibasilar atelectasis and vascular congestion. No overt pulmonary edema. Electronically Signed   By: Richardean Sale M.D.   On: 08/30/2021  12:35   ECHOCARDIOGRAM COMPLETE  Result Date: 08/30/2021    ECHOCARDIOGRAM REPORT   Patient Name:   DENIRO LAYMON Date of Exam: 08/30/2021 Medical Rec #:  [id number]     Height:       71.0 in Accession #:    [id number]    Weight:       163.0 lb Date of Birth:  1951/06/13    BSA:          1.933 m Patient Age:    57 years      BP:           127/87 mmHg Patient Gender: M             HR:           94 bpm. Exam Location:  [facility] Procedure: 2D Echo, 3D Echo, Cardiac Doppler and Color Doppler Indications:    R06.02 SOB  History:        Patient has prior history of Echocardiogram examinations, most                 recent 12/23/2019. CHF, CAD; Risk Factors:Hypertension and HLD.                 Aortic Valve: 29 mm Edwards Sapien prosthetic, stented (TAVR)                 valve is present in the aortic position. Procedure Date:                 05/15/2018.  Sonographer:    Marygrace Drought RCS Referring Phys: [place]  1. Left ventricular ejection fraction, by estimation, is 20 to 25%. The left ventricle has severely decreased function. The left ventricle demonstrates global hypokinesis. There is mild left ventricular hypertrophy. Left ventricular diastolic parameters  are indeterminate.  2. Right ventricular systolic function is mildly reduced. The right ventricular size is moderately enlarged. There is moderately elevated pulmonary artery systolic pressure. The estimated right ventricular systolic pressure is 93.8 mmHg.  3. Right atrial size was mildly dilated.  4. Large pleural effusion in the left lateral region.  5. The mitral valve is normal in structure. Moderate mitral valve regurgitation. No evidence of mitral stenosis.  6. Tricuspid valve regurgitation is moderate.  7. There is a 29 mm Edwards Sapien prosthetic (TAVR) valve present in the aortic position     Aortic valve regurgitation is not visualized. Aortic valve mean gradient measures 2.0 mmHg.  8. Aortic dilatation noted. There is mild  dilatation of the ascending aorta, measuring 38 mm.  9. The inferior vena cava is dilated in size with <50% respiratory variability, suggesting right atrial pressure of  15 mmHg. FINDINGS  Left Ventricle: Left ventricular ejection fraction, by estimation, is 20 to 25%. The left ventricle has severely decreased function. The left ventricle demonstrates global hypokinesis. The left ventricular internal cavity size was normal in size. There is mild left ventricular hypertrophy. Left ventricular diastolic parameters are indeterminate. Right Ventricle: The right ventricular size is moderately enlarged. Right vetricular wall thickness was not well visualized. Right ventricular systolic function is mildly reduced. There is moderately elevated pulmonary artery systolic pressure. The tricuspid regurgitant velocity is 2.98 m/s, and with an assumed right atrial pressure of 15 mmHg, the estimated right ventricular systolic pressure is 84.1 mmHg. Left Atrium: Left atrial size was normal in size. Right Atrium: Right atrial size was mildly dilated. Pericardium: Trivial pericardial effusion is present. Mitral Valve: The mitral valve is normal in structure. Moderate mitral valve regurgitation. No evidence of mitral valve stenosis. Tricuspid Valve: The tricuspid valve is normal in structure. Tricuspid valve regurgitation is moderate. Aortic Valve: The aortic valve has been repaired/replaced. Aortic valve regurgitation is not visualized. Aortic valve mean gradient measures 2.0 mmHg. Aortic valve peak gradient measures 5.0 mmHg. Aortic valve area, by VTI measures 1.33 cm. There is a 29 mm Edwards Sapien prosthetic, stented (TAVR) valve present in the aortic position. Procedure Date: 05/15/2018. Pulmonic Valve: The pulmonic valve was not well visualized. Pulmonic valve regurgitation is not visualized. Aorta: The aortic root is normal in size and structure and aortic dilatation noted. There is mild dilatation of the ascending aorta,  measuring 38 mm. Venous: The inferior vena cava is dilated in size with less than 50% respiratory variability, suggesting right atrial pressure of 15 mmHg. IAS/Shunts: The interatrial septum was not well visualized. Additional Comments: There is a large pleural effusion in the left lateral region.  LEFT VENTRICLE PLAX 2D LVIDd:         4.40 cm   Diastology LVIDs:         4.10 cm   LV e' medial:    5.44 cm/s LV PW:         1.30 cm   LV E/e' medial:  19.9 LV IVS:        1.10 cm   LV e' lateral:   12.20 cm/s LVOT diam:     1.80 cm   LV E/e' lateral: 8.9 LV SV:         22 LV SV Index:   11 LVOT Area:     2.54 cm                           3D Volume EF:                          3D EF:        29 %                          LV EDV:       198 ml                          LV ESV:       141 ml                          LV SV:        57 ml RIGHT VENTRICLE RV Basal diam:  5.60  cm RV Mid diam:    4.80 cm RV S prime:     10.90 cm/s TAPSE (M-mode): 1.7 cm RVSP:           43.5 mmHg LEFT ATRIUM             Index        RIGHT ATRIUM           Index LA diam:        4.70 cm 2.43 cm/m   RA Pressure: 8.00 mmHg LA Vol (A2C):   87.1 ml 45.06 ml/m  RA Area:     20.40 cm LA Vol (A4C):   33.5 ml 17.33 ml/m  RA Volume:   62.00 ml  32.07 ml/m LA Biplane Vol: 56.4 ml 29.18 ml/m  AORTIC VALVE AV Area (Vmax):    1.11 cm AV Area (Vmean):   1.24 cm AV Area (VTI):     1.33 cm AV Vmax:           112.00 cm/s AV Vmean:          70.900 cm/s AV VTI:            0.166 m AV Peak Grad:      5.0 mmHg AV Mean Grad:      2.0 mmHg LVOT Vmax:         48.70 cm/s LVOT Vmean:        34.450 cm/s LVOT VTI:          0.087 m LVOT/AV VTI ratio: 0.52  AORTA Ao Root diam: 3.20 cm Ao Asc diam:  3.80 cm MITRAL VALVE                TRICUSPID VALVE MV Area (PHT):              TR Peak grad:   35.5 mmHg MV Decel Time:              TR Vmax:        298.00 cm/s MV E velocity: 108.00 cm/s  Estimated RAP:  8.00 mmHg                             RVSP:           43.5 mmHg                               SHUNTS                             Systemic VTI:  0.09 m                             Systemic Diam: 1.80 cm Oswaldo Milian MD Electronically signed by Oswaldo Milian MD Signature Date/Time: 08/30/2021/1:03:43 PM    Final    CUP PACEART REMOTE DEVICE CHECK  Result Date: 08/11/2021 Scheduled remote reviewed. Normal device function.  [place] has trended down, followed by HF Next remote 91 days. LR   Microbiology: Recent Results (from the past 240 hour(s))  Resp Panel by RT-PCR (Flu A&B, Covid) Nasopharyngeal Swab     Status: None   Collection Time: 08/30/21  4:37 PM   Specimen: Nasopharyngeal Swab; Nasopharyngeal(NP) swabs in vial transport medium  Result Value Ref Range Status   SARS Coronavirus 2 by RT PCR NEGATIVE  NEGATIVE Final    Comment: (NOTE) SARS-CoV-2 target nucleic acids are NOT DETECTED.  The SARS-CoV-2 RNA is generally detectable in upper respiratory specimens during the acute phase of infection. The lowest concentration of SARS-CoV-2 viral copies this assay can detect is 138 copies/mL. A negative result does not preclude SARS-Cov-2 infection and should not be used as the sole basis for treatment or other patient management decisions. A negative result may occur with  improper specimen collection/handling, submission of specimen other than nasopharyngeal swab, presence of viral mutation(s) within the areas targeted by this assay, and inadequate number of viral copies(<138 copies/mL). A negative result must be combined with clinical observations, patient history, and epidemiological information. The expected result is Negative.  Fact Sheet for Patients:  EntrepreneurPulse.com.au  Fact Sheet for Healthcare Providers:  IncredibleEmployment.be  This test is no t yet approved or cleared by the [place] FDA and  has been authorized for detection and/or diagnosis of SARS-CoV-2 by FDA under an Emergency Use  Authorization (EUA). This EUA will remain  in effect (meaning this test can be used) for the duration of the COVID-19 declaration under Section 564(b)(1) of the Act, 21 U.S.C.section 360bbb-3(b)(1), unless the authorization is terminated  or revoked sooner.       Influenza A by PCR NEGATIVE NEGATIVE Final   Influenza B by PCR NEGATIVE NEGATIVE Final    Comment: (NOTE) The Xpert Xpress SARS-CoV-2/FLU/RSV plus assay is intended as an aid in the diagnosis of influenza from Nasopharyngeal swab specimens and should not be used as a sole basis for treatment. Nasal washings and aspirates are unacceptable for Xpert Xpress SARS-CoV-2/FLU/RSV testing.  Fact Sheet for Patients: EntrepreneurPulse.com.au  Fact Sheet for Healthcare Providers: IncredibleEmployment.be  This test is not yet approved or cleared by the [place] FDA and has been authorized for detection and/or diagnosis of SARS-CoV-2 by FDA under an Emergency Use Authorization (EUA). This EUA will remain in effect (meaning this test can be used) for the duration of the COVID-19 declaration under Section 564(b)(1) of the Act, 21 U.S.C. section 360bbb-3(b)(1), unless the authorization is terminated or revoked.  Performed at [facility], [place] [address] [place], [place] [address]      Labs: Basic Metabolic Panel: Recent Labs  Lab 08/30/21 1215 08/31/21 0304 09/01/21 0351 09/02/21 0317 09/03/21 0312  NA 139 140 139 144 143  K 4.5 3.7 4.4 3.5 4.2  CL 109 104 104 106 106  CO2 21* 25 26 29 28   GLUCOSE 250* 295* 177* 77 158*  BUN 23 21 23 22 20   CREATININE 1.33* 1.36* 1.39* 1.45* 1.49*  CALCIUM 8.4* 8.9 8.5* 8.6* 9.0   Liver Function Tests: Recent Labs  Lab 08/30/21 0832  AST 34  ALT 32  ALKPHOS 101  BILITOT 0.7  PROT 7.2  ALBUMIN 4.1   No results for input(s): LIPASE, AMYLASE in the last 168 hours. No results for input(s): AMMONIA in the last 168  hours. CBC: Recent Labs  Lab 08/30/21 1215 09/01/21 0351  WBC 3.4* 4.1  NEUTROABS 2.1 2.3  HGB 12.2* 12.4*  HCT 38.9* 39.1  MCV 77.5* 76.8*  PLT 247 248   Cardiac Enzymes: No results for input(s): CKTOTAL, CKMB, CKMBINDEX, TROPONINI in the last 168 hours. BNP: BNP (last 3 results) Recent Labs    08/23/21 1559 08/30/21 0832 08/30/21 1215  BNP 2,029.3* 1,931.7* 1,424.1*    ProBNP (last 3 results) No results for input(s): PROBNP in the last 8760 hours.  CBG: Recent Labs  Lab 09/02/21 0611 09/02/21 1055 09/02/21 1525 09/02/21 2106 09/03/21 0539  GLUCAP 211* 152* 236* 167* 109*       Signed:  Domenic Polite MD.  [facility] 09/03/2021, 11:14 AM

## 2021-09-03 NOTE — TOC Progression Note (Signed)
Transition of Care [facility]) - Progression Note    Patient Details  Name: [name] MRN: [id number] Date of Birth: 13-Jul-1951  Transition of Care [facility]) CM/SW Contact  Zenon Mayo, RN Phone Number: 09/03/2021, 1:54 PM  Clinical Narrative:    Patient Corlanor is changed to [place], which is over 300.00 , NCM informed Lilia Pro with HF team, she states he has an HR clinic apt on Monday and they will follow up with him on his farxiga medication then at the clinic and see how they can assist him with it. This information was given to [facility] Staff RN and Dr. Broadus Jansel.         Expected Discharge Plan and Services           Expected Discharge Date: 09/03/21                                     Social Determinants of Health (SDOH) Interventions Food Insecurity Interventions: Intervention Not Indicated Financial Strain Interventions: Intervention Not Indicated Housing Interventions: Intervention Not Indicated Transportation Interventions: Intervention Not Indicated  Readmission Risk Interventions No flowsheet data found.

## 2021-09-03 NOTE — Plan of Care (Signed)

## 2021-09-05 NOTE — Progress Notes (Signed)
HEART & VASCULAR TRANSITION OF CARE CONSULT NOTE     Referring Physician: Dr Gwenlyn Found  Primary Care: Dr Dorthy Cooler Primary Cardiologist:Dr Turner EP: Dr Rayann Heman   HF MD: Dr Haroldine Laws  HPI: Referred to clinic by Dr Gwenlyn Found for heart failure consultation.   [name] is a 71 year old with a history of squamous cell lung cancer LLL 1985, CAD, CABG x2 2013 , aortic stenosis, TAVR 2019, LBBB, St Jude CRT-D, HTN, OSA, and chronic HFrEF 2019.   In the past he was on corlanor but stopped it due to cost.   Had RHC/LHC with Dr Haroldine Laws for TAVR work up.   Admitted 08/30/21 with A/C HFrEF. Echo completed EF remains < 20%. Diuresed with IV lasix and transitioned to lasix 40 mg twice a day. Intolerant entresto due to hypotentsion.GDMT with losartan, toprol xl, and spiro. Discharged 09/03/21 with CPAP machine but doesn't know how to use it. Discharge weight 152 pounds.   Overall feeling fine. Says he feels shaky when he is walking. Remains SOB with exertion but says its better. Denies PND/Orthopnea. Using CPAP but he does not what settings to use.  He doesn't know how to use the machine. Appetite ok. No fever or chills. Weight at home 152 pounds. Taking all medications except aspirin. Recently separated from his wife. Lives alone. Works full time in [place] at [employer].   Cardiac Testing  Echo 08/2021 EF <20%  Echo 2021 Ef < 20%  Echo 2020 EF 30%  LHC/RHC  2019   Ost 1st Diag to 1st Diag lesion is 50% stenosed. Prox LAD to Mid LAD lesion is 40% stenosed. Ost RCA lesion is 100% stenosed. SVG and is normal in caliber and large. SVG and is large. Ost Cx to Prox Cx lesion is 100% stenosed. Ost LAD to Prox LAD lesion is 40% stenosed.  Ao =109/68 (86) LV = 117/29 RA = 19 RV = 54/17 PA = 62/21 (42) PCW = 40 Fick cardiac output/index = 3.5/1.78 PVR = 0.6 WU FA sat = 96% PA sat = 58%, 61% PAPi = 2.2 RA/PCWP = 0.475 CPO = 0.67 Av gradient: peak 15.0 mmHG  Mean 14.24mmHG  AVA = 0.89 cm2   Review  of Systems: [y] = yes, [ ]  = no   General: Weight gain [ ] ; Weight loss [ ] ; Anorexia [ ] ; Fatigue [ Y]; Fever [ ] ; Chills [ ] ; Weakness [ Y]  Cardiac: Chest pain/pressure [ ] ; Resting SOB [ ] ; Exertional SOB [ Y]; Orthopnea [ ] ; Pedal Edema [ ] ; Palpitations [ ] ; Syncope [ ] ; Presyncope [ ] ; Paroxysmal nocturnal dyspnea[ ]   Pulmonary: Cough [ ] ; Wheezing[ ] ; Hemoptysis[ ] ; Sputum [ ] ; Snoring [ ]   GI: Vomiting[ ] ; Dysphagia[ ] ; Melena[ ] ; Hematochezia [ ] ; Heartburn[ ] ; Abdominal pain [ ] ; Constipation [ ] ; Diarrhea [ ] ; BRBPR [ ]   GU: Hematuria[ ] ; Dysuria [ ] ; Nocturia[ ]   Vascular: Pain in legs with walking [ ] ; Pain in feet with lying flat [ ] ; Non-healing sores [ ] ; Stroke [ ] ; TIA [ ] ; Slurred speech [ ] ;  Neuro: Headaches[ ] ; Vertigo[ ] ; Seizures[ ] ; Paresthesias[ ] ;Blurred vision [ ] ; Diplopia [ ] ; Vision changes [ ]   Ortho/Skin: Arthritis [ ] ; Joint pain [ Y]; Muscle pain [ ] ; Joint swelling [ ] ; Back Pain [ Y]; Rash [ ]   Psych: Depression[ Y]; Anxiety[ ]   Heme: Bleeding problems [ ] ; Clotting disorders [ ] ; Anemia [ ]   Endocrine: Diabetes [ Y]; Thyroid  dysfunction[ ]    Past Medical History:  Diagnosis Date   AICD (automatic cardioverter/defibrillator) present 02/11/2020   Anemia    Arthritis    Atrial tachycardia (HCC)    Chronic systolic CHF (congestive heart failure) ([place]) 07/04/2018   CKD (chronic kidney disease), stage III ([place])    Coronary artery disease 02/2012   a. s/p stenting in 2013  b. s/p CABGx2V (SVG--> PDA, SVG--> LCx).   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  Intolerant to PAP therapy   S/P TAVR (transcatheter aortic valve replacement) 05/15/2018   29 mm Edwards Sapien 3 transcatheter heart valve placed via percutaneous right transfemoral approach    Severe aortic stenosis     Current Outpatient Medications  Medication Sig Dispense Refill   amitriptyline (ELAVIL) 25 MG tablet Take 25 mg by mouth at bedtime.      B Complex-C (SUPER B COMPLEX PO) Take 1 tablet by mouth daily.     B-D ULTRAFINE III SHORT PEN 31G X 8 MM MISC 1 each by Other route daily as needed (GLUCOSE TESTING (INSULINE NEEDLE)).      dapagliflozin propanediol (FARXIGA) 10 MG TABS tablet Take 1 tablet (10 mg total) by mouth daily. 30 tablet 0   furosemide (LASIX) 20 MG tablet Take 2 tablets (40 mg total) by mouth daily. 60 tablet 1   insulin detemir (LEVEMIR) 100 UNIT/ML injection Inject 10-18 Units into the skin See admin instructions. 18 units in the morning, 10 units at bedtime     insulin lispro (HUMALOG) 100 UNIT/ML KwikPen Inject 2-3 Units into the skin See admin instructions. Per sliding scale at Breakfast and Dinner     levothyroxine (SYNTHROID, LEVOTHROID) 50 MCG tablet Take 50 mcg by mouth daily before breakfast.      losartan (COZAAR) 25 MG tablet TAKE 1 TABLET(25 MG) BY MOUTH DAILY 90 tablet 3   metFORMIN (GLUCOPHAGE) 500 MG tablet Take 2 tablets (1,000 mg total) by mouth 2 (two) times daily with a meal.     metoprolol succinate (TOPROL-XL) 100 MG 24 hr tablet Take 1 tablet (100 mg total) by mouth daily. Take with or immediately following a meal. 30 tablet 5   Multiple Vitamin (MULTIVITAMIN) tablet Take 1 tablet by mouth daily.     Omega-3 Fatty Acids (FISH OIL PO) Take 1 tablet by mouth daily.     ONE TOUCH ULTRA TEST test strip 1 each by Other route as needed for other.      simvastatin (ZOCOR) 20 MG  tablet Take 20 mg by mouth every evening.      spironolactone (ALDACTONE) 25 MG tablet Take 1/2 tablet (12.5 mg total) by mouth daily. 30 tablet 0   zolpidem (AMBIEN) 5 MG tablet Take 2.5 mg by mouth at bedtime.     aspirin EC 81 MG EC tablet Take 1 tablet (81 mg total) by mouth  daily. Swallow whole. (Patient not taking: Reported on 09/06/2021) 30 tablet 11   No current facility-administered medications for this encounter.    Allergies  Allergen Reactions   Atorvastatin Other (See Comments)    Muscle pain Other reaction(s): cramps   Entresto [Sacubitril-Valsartan] Other (See Comments)    hypotension   Adhesive [Tape] Rash      Social History   Socioeconomic History   Marital status: Married    Spouse name: Not on file   Number of children: Not on file   Years of education: Not on file   Highest education level: Not on file  Occupational History   Occupation: security guard    Comment: [employer]  Tobacco Use   Smoking status: Former    Types: Cigarettes    Quit date: 03/06/1985    Years since quitting: 36.5   Smokeless tobacco: Never  Vaping Use   Vaping Use: Never used  Substance and Sexual Activity   Alcohol use: No   Drug use: No   Sexual activity: Not Currently  Other Topics Concern   Not on file  Social History Narrative   Not on file   Social Determinants of Health   Financial Resource Strain: High Risk   Difficulty of Paying Living Expenses: Hard  Food Insecurity: No Food Insecurity   Worried About Running Out of Food in the Last Year: Never true   Ran Out of Food in the Last Year: Never true  Transportation Needs: No Transportation Needs   Lack of Transportation (Medical): No   Lack of Transportation (Non-Medical): No  Physical Activity: Not on file  Stress: Not on file  Social Connections: Not on file  Intimate Partner Violence: Not on file      Family History  Problem Relation Age of Onset   Diabetes Father    Diabetes Mother    Heart failure  Mother    Hypertension Mother    Cancer Brother    Diabetes Sister     Vitals:   09/06/21 1220  BP: 108/70  Pulse: (!) 102  SpO2: 99%  Weight: 71 kg (156 lb 9.6 oz)   Wt Readings from Last 3 Encounters:  09/06/21 71 kg (156 lb 9.6 oz)  09/02/21 68.8 kg (151 lb 11.2 oz)  08/30/21 73.9 kg (163 lb)    PHYSICAL EXAM: General:   No respiratory difficulty. Walked in the clinic.  HEENT: normal Neck: supple. no JVD. Carotids 2+ bilat; no bruits. No lymphadenopathy or thryomegaly appreciated. Cor: PMI nondisplaced. Tachy Regular rate & rhythm. No rubs, or murmurs. Soft S3 Lungs: clear Abdomen: soft, nontender, nondistended. No hepatosplenomegaly. No bruits or masses. Good bowel sounds. Extremities: no cyanosis, clubbing, rash, edema Neuro: alert & oriented x 3, cranial nerves grossly intact. moves all 4 extremities w/o difficulty. Affect pleasant.  ECG: ST 102 bpm QRS 150 ms    ASSESSMENT & PLAN: Chronic HFrEF  Has St Jude CRT-D. Impedance up. Not suggestive of fluid accumulation.  Had PYP 2020 not suggestive of amyloid.  NYHA III.  Volume status stable. Continue lasix 40 mg twice a day  - Continue Toprol XL 100 mg daily . Add digoxin 0.125 mg daily.  - Heart in the 90-100s. In the past he was on corlanor however he cant afford it.  - next visit will check TSH and dig level.  - Failed entresto in the past due to hypotension. Continue losaratn 25 mg daily . MRA - Continue jardiance 10 mg daily  - Check BMET  today  - Discussed CPX test however he wants to hold off.  - He does have myoview set up next month. If abnormal would recommend RHC/LHC to assess hemodynamics.   2. CAD H/O CABG 2013 Had myoview  ordered.  - No chest pain.   3. LBBB  QRS 150 ms on EKG today.   4. DMII Hgb A1C 8.3  On farxiga  5. OSA - started on CPAP during his recent hospitalization - Discharged with CPAP machine but he was not instructed how to use it.   6. AS, S/P TAVP 2019 Edwards SAPIEN  valve  -Valve ok on recent ECHO  7. Hypothyroid TSH 5.6 09/06/2019 On levothyroxine. Needs TSH checked   Referred to HFSW (Coping  ): Yes  Refer to Pharmacy: Yes Refer to Home Health: Yes  Refer to Advanced Heart Failure Clinic: Yes ---> Dr Haroldine Laws  Refer to General Cardiology: Shared.   Follow up  in 3 weeks with Dr Haroldine Laws. Needs dig level and TSH   Tieisha Darden NP-C  4:08 PM

## 2021-09-06 ENCOUNTER — Other Ambulatory Visit: Payer: Self-pay

## 2021-09-06 ENCOUNTER — Telehealth (HOSPITAL_COMMUNITY): Payer: Self-pay

## 2021-09-06 ENCOUNTER — Ambulatory Visit (HOSPITAL_COMMUNITY)
Admit: 2021-09-06 | Discharge: 2021-09-06 | Disposition: A | Discharge status: Home / Self Care | Payer: Medicare Other | Attending: Adult Health | Admitting: Adult Health

## 2021-09-06 ENCOUNTER — Encounter (HOSPITAL_COMMUNITY): Payer: Self-pay

## 2021-09-06 VITALS — BP 108/70 | HR 102 | Wt 156.6 lb

## 2021-09-06 DIAGNOSIS — E039 Hypothyroidism, unspecified: Secondary | ICD-10-CM | POA: Diagnosis not present

## 2021-09-06 DIAGNOSIS — N183 Chronic kidney disease, stage 3 unspecified: Secondary | ICD-10-CM | POA: Diagnosis not present

## 2021-09-06 DIAGNOSIS — G4733 Obstructive sleep apnea (adult) (pediatric): Secondary | ICD-10-CM | POA: Diagnosis not present

## 2021-09-06 DIAGNOSIS — Z95810 Presence of automatic (implantable) cardiac defibrillator: Secondary | ICD-10-CM | POA: Diagnosis not present

## 2021-09-06 DIAGNOSIS — I2510 Atherosclerotic heart disease of native coronary artery without angina pectoris: Secondary | ICD-10-CM | POA: Diagnosis not present

## 2021-09-06 DIAGNOSIS — Z794 Long term (current) use of insulin: Secondary | ICD-10-CM | POA: Diagnosis not present

## 2021-09-06 DIAGNOSIS — Z7984 Long term (current) use of oral hypoglycemic drugs: Secondary | ICD-10-CM | POA: Diagnosis not present

## 2021-09-06 DIAGNOSIS — E1122 Type 2 diabetes mellitus with diabetic chronic kidney disease: Secondary | ICD-10-CM | POA: Insufficient documentation

## 2021-09-06 DIAGNOSIS — E785 Hyperlipidemia, unspecified: Secondary | ICD-10-CM | POA: Diagnosis not present

## 2021-09-06 DIAGNOSIS — Z833 Family history of diabetes mellitus: Secondary | ICD-10-CM | POA: Insufficient documentation

## 2021-09-06 DIAGNOSIS — R000 Tachycardia, unspecified: Secondary | ICD-10-CM

## 2021-09-06 DIAGNOSIS — Z7982 Long term (current) use of aspirin: Secondary | ICD-10-CM | POA: Insufficient documentation

## 2021-09-06 DIAGNOSIS — I5022 Chronic systolic (congestive) heart failure: Secondary | ICD-10-CM | POA: Insufficient documentation

## 2021-09-06 DIAGNOSIS — I509 Heart failure, unspecified: Secondary | ICD-10-CM | POA: Diagnosis not present

## 2021-09-06 DIAGNOSIS — I447 Left bundle-branch block, unspecified: Secondary | ICD-10-CM | POA: Insufficient documentation

## 2021-09-06 DIAGNOSIS — Z951 Presence of aortocoronary bypass graft: Secondary | ICD-10-CM | POA: Insufficient documentation

## 2021-09-06 DIAGNOSIS — Z952 Presence of prosthetic heart valve: Secondary | ICD-10-CM | POA: Insufficient documentation

## 2021-09-06 DIAGNOSIS — Z9989 Dependence on other enabling machines and devices: Secondary | ICD-10-CM

## 2021-09-06 DIAGNOSIS — I130 Hypertensive heart and chronic kidney disease with heart failure and stage 1 through stage 4 chronic kidney disease, or unspecified chronic kidney disease: Secondary | ICD-10-CM | POA: Diagnosis not present

## 2021-09-06 DIAGNOSIS — I350 Nonrheumatic aortic (valve) stenosis: Secondary | ICD-10-CM | POA: Diagnosis not present

## 2021-09-06 LAB — BASIC METABOLIC PANEL
Anion gap: 9 (ref 5–15)
BUN: 28 mg/dL — ABNORMAL HIGH (ref 8–23)
CO2: 27 mmol/L (ref 22–32)
Calcium: 9 mg/dL (ref 8.9–10.3)
Chloride: 106 mmol/L (ref 98–111)
Creatinine, Ser: 1.52 mg/dL — ABNORMAL HIGH (ref 0.61–1.24)
GFR, Estimated: 48 mL/min — ABNORMAL LOW (ref >60)
Glucose, Bld: 127 mg/dL — ABNORMAL HIGH (ref 70–99)
Potassium: 4.3 mmol/L (ref 3.5–5.1)
Sodium: 142 mmol/L (ref 135–145)

## 2021-09-06 MED ORDER — DIGOXIN 125 MCG PO TABS: 0.1250 mg | ORAL_TABLET | Freq: Every day | ORAL | 3 refills | Status: DC

## 2021-09-06 NOTE — Patient Instructions (Addendum)
START Digoxin 0.125 mg daily  Labs done today, your results will be available in MyChart, we will contact you for abnormal readings  Your physician recommends that you schedule a follow-up appointment in: Dr. Radford Pax.The office will call to schedule an appointment  Your physician recommends that you schedule a follow-up appointment in: 3 weeks with Dr. Haroldine Laws If you have any questions or concerns before your next appointment please send [place] a message through [facility] or call our office at [phone].    TO LEAVE A MESSAGE FOR THE NURSE SELECT OPTION 2, PLEASE LEAVE A MESSAGE INCLUDING: YOUR NAME DATE OF BIRTH CALL BACK NUMBER REASON FOR CALL**this is important as we prioritize the call backs  YOU WILL RECEIVE A CALL BACK THE SAME DAY AS LONG AS YOU CALL BEFORE 4:00 PM At the [facility], you and your health needs are our priority. As part of our continuing mission to provide you with exceptional heart care, we have created designated Provider Care Teams. These Care Teams include your primary Cardiologist (physician) and Advanced Practice Providers (APPs- Physician Assistants and Nurse Practitioners) who all work together to provide you with the care you need, when you need it.   You may see any of the following providers on your designated Care Team at your next follow up: Dr Glori Bickers Dr Haynes Kerns, NP Lyda Jester, [place] [facility] [place], [place] Audry Riles, PharmD   Please be sure to bring in all your medications bottles to every appointment.   Do the following things EVERYDAY: Weigh yourself in the morning before breakfast. Write it down and keep it in a log. Take your medicines as prescribed Eat low salt foods--Limit salt (sodium) to 2000 mg per day.  Stay as active as you can everyday Limit all fluids for the day to less than 2 liters

## 2021-09-06 NOTE — Telephone Encounter (Signed)
Heart Failure Nurse Navigator Note  Attempted to contact regarding [place] clinic appt today at noon. No answer on home and mobile number. No opportunity to leave message. Unable to reach patient at this time.   Pricilla Holm, MSN, RN Heart Failure Nurse Navigator [phone]

## 2021-09-06 NOTE — Progress Notes (Signed)
Heart and Vascular Care Navigation  09/06/2021  [name] [birth year] [id number]  Reason for Referral: Patient seen in HF TOC.   Engaged with patient face to face for initial visit for Heart and Vascular Care Coordination.                                                                                                   Assessment: Patient is a 71 yo male who states he is recently separated. Patient his wife of 27 years left him in September for a "younger man". Patient states he has 2 adult sons (ages 31 & 49) from his first marriage who both live nearby. Patient lives alone in a single family home. Patient works full time as a Animal nutritionist at [employer] and also has a [place]. Patient  denies any concerns or needs with [facility].                                     HRT/VAS Care Coordination     Patients Home Cardiology Office --  [facility]   Outpatient Care Team Social Worker   Social Worker Name: Raquel Sarna, [name] [phone]   Living arrangements for the past 2 months Single Family Home   Lives with: Self   Patient Current Insurance Coverage Traditional Medicare   Patient Has Concern With Paying Medical Bills No   Does Patient Have Prescription Coverage? Yes   Home Assistive Devices/Equipment Scales       Social History:                                                                             SDOH Screenings   Alcohol Screen: Low Risk    Last Alcohol Screening Score (AUDIT): 0  Depression (PHQ2-9): Not on file  Financial Resource Strain: Low Risk    Difficulty of Paying Living Expenses: Not very hard  Food Insecurity: No Food Insecurity   Worried About Running Out of Food in the Last Year: Never true   Ran Out of Food in the Last Year: Never true  Housing: Low Risk    Last Housing Risk Score: 0  Physical Activity: Not on file  Social Connections: Not on file  Stress: Not on file  Tobacco Use: Medium Risk   Smoking Tobacco Use: Former   Smokeless Tobacco Use:  Never   Passive Exposure: Not on file  Transportation Needs: No Transportation Needs   Lack of Transportation (Medical): No   Lack of Transportation (Non-Medical): No    SDOH Interventions: Financial Resources:  Sales promotion account executive Interventions: Intervention Not Indicated N/a  Food Insecurity:   N/a  Housing Insecurity:   N/a  Transportation:    N/a    Follow-up plan:  CSW provided supportive intervention and offered counseling due to the recent separation although patient denied any need. CSW available if needed. Raquel Sarna, [place], [place]

## 2021-09-13 ENCOUNTER — Telehealth (HOSPITAL_COMMUNITY): Payer: Self-pay | Admitting: *Deleted

## 2021-09-13 NOTE — Telephone Encounter (Signed)
Patient given detailed instructions per Myocardial Perfusion Study Information Sheet for the test on 09/20/2021 at 10:00. Patient notified to arrive 15 minutes early and that it is imperative to arrive on time for appointment to keep from having the test rescheduled.  If you need to cancel or reschedule your appointment, please call the office within 24 hours of your appointment. . Patient verbalized understanding.[name]

## 2021-09-14 ENCOUNTER — Ambulatory Visit (INDEPENDENT_AMBULATORY_CARE_PROVIDER_SITE_OTHER): Payer: Medicare Other

## 2021-09-14 DIAGNOSIS — E7800 Pure hypercholesterolemia, unspecified: Secondary | ICD-10-CM | POA: Diagnosis not present

## 2021-09-14 DIAGNOSIS — I2510 Atherosclerotic heart disease of native coronary artery without angina pectoris: Secondary | ICD-10-CM | POA: Diagnosis not present

## 2021-09-14 DIAGNOSIS — E039 Hypothyroidism, unspecified: Secondary | ICD-10-CM | POA: Diagnosis not present

## 2021-09-14 DIAGNOSIS — Z952 Presence of prosthetic heart valve: Secondary | ICD-10-CM | POA: Diagnosis not present

## 2021-09-14 DIAGNOSIS — E1122 Type 2 diabetes mellitus with diabetic chronic kidney disease: Secondary | ICD-10-CM | POA: Diagnosis not present

## 2021-09-14 DIAGNOSIS — N1831 Chronic kidney disease, stage 3a: Secondary | ICD-10-CM | POA: Diagnosis not present

## 2021-09-14 DIAGNOSIS — I509 Heart failure, unspecified: Secondary | ICD-10-CM

## 2021-09-14 DIAGNOSIS — Z95810 Presence of automatic (implantable) cardiac defibrillator: Secondary | ICD-10-CM | POA: Diagnosis not present

## 2021-09-15 NOTE — Progress Notes (Deleted)
Cardiology Office Note    Date:  09/15/2021   ID:  [name], DOB [birth year], MRN [id number]   PCP:  [name], [place]  Cardiologist:  Fransico Him, MD   Advanced Practice Provider:  No care team member to display Electrophysiologist:  Thompson Grayer, MD   [phone]   No chief complaint on file.   History of Present Illness:  [name] is a 71 y.o. male with a history of squamous cell lung cancer LLL 1985, CAD, CABG x2 2013 , aortic stenosis, TAVR 2019, LBBB, St Jude CRT-D, HTN, OSA, and chronic HFrEF 2019.    Admitted 08/30/21 with A/C HFrEF. Echo completed EF remains < 20%. Diuresed with IV lasix and transitioned to lasix 40 mg twice a day. Intolerant entresto due to hypotentsion.GDMT with losartan, toprol xl, and spiro. Discharged 09/03/21 with CPAP machine but doesn't know how to use it. Discharge weight 152 pounds.   Seen in AHF clinic 09/06/21 and CPX offered but he wanted to hold off. Scheduled for myoview and if abn will need cath. Can't afford corlanor    Past Medical History:  Diagnosis Date   AICD (automatic cardioverter/defibrillator) present 02/11/2020   Anemia    Arthritis    Atrial tachycardia (HCC)    Chronic systolic CHF (congestive heart failure) ([place]) 07/04/2018   CKD (chronic kidney disease), stage III ([place])    Coronary artery disease 02/2012   a. s/p stenting in 2013  b. s/p CABGx2V (SVG--> PDA, SVG--> LCx).   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  Intolerant to PAP therapy   S/P TAVR (transcatheter aortic valve replacement) 05/15/2018   29 mm Edwards Sapien 3 transcatheter heart valve placed via percutaneous right transfemoral approach    Severe aortic stenosis     Past Surgical History:  Procedure Laterality Date   BIV ICD INSERTION CRT-D N/A 02/11/2020   Procedure: BIV ICD INSERTION CRT-D;  Surgeon: Thompson Grayer, MD;  Location: [place] CV LAB;  Service: Cardiovascular;  Laterality: N/A;   COLONOSCOPY  07/25/2012   Procedure: COLONOSCOPY;  Surgeon: Winfield Cunas., MD;  Location: [facility] ENDOSCOPY;  Service: Endoscopy;  Laterality: N/A;   COLONOSCOPY N/A 12/02/2013   Procedure: COLONOSCOPY;  Surgeon: Winfield Cunas., MD;  Location: [facility] ENDOSCOPY;  Service: Endoscopy;  Laterality: N/A;   CORONARY ARTERY BYPASS GRAFT N/A 01/04/2013   Procedure: CORONARY ARTERY BYPASS GRAFTING (CABG) times two using right saphenous vein harvested with endoscope.;  Surgeon: Ivin Poot, MD;  Location: [place];  Service: Open Heart Surgery;  Laterality: N/A;   ESOPHAGOGASTRODUODENOSCOPY  07/20/2012   Procedure: ESOPHAGOGASTRODUODENOSCOPY (EGD);  Surgeon: Winfield Cunas., MD;  Location: [facility] ENDOSCOPY;  Service: Endoscopy;  Laterality: N/A;   ESOPHAGOGASTRODUODENOSCOPY N/A 12/02/2013   Procedure: ESOPHAGOGASTRODUODENOSCOPY (EGD);  Surgeon: Winfield Cunas.,  MD;  Location: [facility] ENDOSCOPY;  Service: Endoscopy;  Laterality: N/A;   HOT HEMOSTASIS N/A 12/02/2013   Procedure: HOT HEMOSTASIS (ARGON PLASMA COAGULATION/BICAP);  Surgeon: Winfield Cunas., MD;  Location: [facility] ENDOSCOPY;  Service: Endoscopy;  Laterality: N/A;   INTRAOPERATIVE TRANSESOPHAGEAL ECHOCARDIOGRAM N/A 01/04/2013   Procedure: INTRAOPERATIVE TRANSESOPHAGEAL ECHOCARDIOGRAM;  Surgeon: Ivin Poot, MD;  Location: [place];  Service: Open Heart Surgery;  Laterality: N/A;   INTRAOPERATIVE  TRANSTHORACIC ECHOCARDIOGRAM N/A 05/15/2018   Procedure: INTRAOPERATIVE TRANSTHORACIC ECHOCARDIOGRAM;  Surgeon: Burnell Blanks, MD;  Location: [place];  Service: Open Heart Surgery;  Laterality: N/A;   IR THORACENTESIS ASP PLEURAL SPACE W/IMG GUIDE  10/19/2018   LEFT HEART CATHETERIZATION WITH CORONARY ANGIOGRAM N/A 03/06/2012   Procedure: LEFT HEART CATHETERIZATION WITH CORONARY ANGIOGRAM;  Surgeon: [name], MD;  Location: [place] CATH LAB;  Service: Cardiovascular;  Laterality: N/A;   LITHOTRIPSY     [place] Left 01/04/2013   Procedure: RADIAL ARTERY HARVEST;  Surgeon: Ivin Poot, MD;  Location: [place];  Service: Vascular;  Laterality: Left;  Artery not havested. Unsuitable for use.   resection mediastinal seminonma     RIGHT/LEFT HEART CATH AND CORONARY/GRAFT ANGIOGRAPHY N/A 11/15/2016   Procedure: Right/Left Heart Cath and Coronary/Graft Angiography;  Surgeon: Leonie Man, MD;  Location: [place] CV LAB;  Service: Cardiovascular;  Laterality: N/A;   RIGHT/LEFT HEART CATH AND CORONARY/GRAFT ANGIOGRAPHY N/A 04/13/2018   Procedure: RIGHT/LEFT HEART CATH AND CORONARY/GRAFT ANGIOGRAPHY;  Surgeon: Jolaine Artist, MD;  Location: [place] CV LAB;  Service: Cardiovascular;  Laterality: N/A;   TRANSCATHETER AORTIC VALVE REPLACEMENT, TRANSFEMORAL  05/15/2018   TRANSCATHETER AORTIC VALVE REPLACEMENT, TRANSFEMORAL N/A 05/15/2018   Procedure: TRANSCATHETER AORTIC VALVE REPLACEMENT, TRANSFEMORAL;  Surgeon: Burnell Blanks, MD;  Location: [place];  Service: Open Heart Surgery;  Laterality: N/A;    Current Medications: No outpatient medications have been marked as taking for the 09/22/21 encounter (Appointment) with Imogene Burn, PA-C.     Allergies:   Atorvastatin, Entresto [sacubitril-valsartan], and Adhesive [tape]   Social History   Socioeconomic History   Marital status: Married    Spouse name: Not on file   Number of children:  Not on file   Years of education: Not on file   Highest education level: Not on file  Occupational History   Occupation: security guard    Comment: [employer]  Tobacco Use   Smoking status: Former    Types: Cigarettes    Quit date: 03/06/1985    Years since quitting: 36.5   Smokeless tobacco: Never  Vaping Use   Vaping Use: Never used  Substance and Sexual Activity   Alcohol use: No   Drug use: No   Sexual activity: Not Currently  Other Topics Concern   Not on file  Social History Narrative   Not on file   Social Determinants of Health   Financial Resource Strain: Low Risk    Difficulty of Paying Living Expenses: Not very hard  Food Insecurity: No Food Insecurity   Worried About Running Out of Food in the Last Year: Never true   Ran Out of Food in the Last Year: Never true  Transportation Needs: No Transportation Needs   Lack of Transportation (Medical): No   Lack of Transportation (Non-Medical): No  Physical Activity: Not on file  Stress: Not on file  Social Connections: Not on file     Family History:  The patient's ***family  history includes Cancer in his brother; Diabetes in his father, mother, and sister; Heart failure in his mother; Hypertension in his mother.   ROS:   Please see the history of present illness.    ROS All other systems reviewed and are negative.   PHYSICAL EXAM:   VS:  There were no vitals taken for this visit.  Physical Exam  GEN: Well nourished, well developed, in no acute distress  HEENT: normal  Neck: no JVD, carotid bruits, or masses Cardiac:RRR; no murmurs, rubs, or gallops  Respiratory:  clear to auscultation bilaterally, normal work of breathing GI: soft, nontender, nondistended, + BS Ext: without cyanosis, clubbing, or edema, Good distal pulses bilaterally MS: no deformity or atrophy  Skin: warm and dry, no rash Neuro:  Alert and Oriented x 3, Strength and sensation are intact Psych: euthymic mood, full affect  Wt Readings from  Last 3 Encounters:  09/06/21 156 lb 9.6 oz (71 kg)  09/02/21 151 lb 11.2 oz (68.8 kg)  08/30/21 163 lb (73.9 kg)      Studies/Labs Reviewed:   EKG:  EKG is*** ordered today.  The ekg ordered today demonstrates ***  Recent Labs: 08/30/2021: ALT 32; B Natriuretic Peptide 1,424.1 09/01/2021: Hemoglobin 12.4; Platelets 248 09/06/2021: BUN 28; Creatinine, Ser 1.52; Potassium 4.3; Sodium 142   Lipid Panel    Component Value Date/Time   CHOL 91 (L) 08/30/2021 0832   TRIG 39 08/30/2021 0832   HDL 36 (L) 08/30/2021 0832   CHOLHDL 2.5 08/30/2021 0832   CHOLHDL 2.9 04/12/2018 0216   VLDL 7 04/12/2018 0216   LDLCALC 44 08/30/2021 0832    Additional studies/ records that were reviewed today include:    Echo 08/2021 EF <20%  Echo 2021 Ef < 20%  Echo 2020 EF 30%   LHC/RHC  2019   Ost 1st Diag to 1st Diag lesion is 50% stenosed. Prox LAD to Mid LAD lesion is 40% stenosed. Ost RCA lesion is 100% stenosed. SVG and is normal in caliber and large. SVG and is large. Ost Cx to Prox Cx lesion is 100% stenosed. Ost LAD to Prox LAD lesion is 40% stenosed.  Ao =109/68 (86) LV = 117/29 RA = 19 RV = 54/17 PA = 62/21 (42) PCW = 40 Fick cardiac output/index = 3.5/1.78 PVR = 0.6 WU FA sat = 96% PA sat = 58%, 61% PAPi = 2.2 RA/PCWP = 0.475 CPO = 0.67 Av gradient: peak 15.0 mmHG  Mean 14.58mmHG  AVA = 0.89 cm2    Risk Assessment/Calculations:   {Does this patient have ATRIAL FIBRILLATION?:[id number]}     ASSESSMENT:    No diagnosis found.   PLAN:  In order of problems listed above:  Chronic HFrEF status post Saint Jude CRT-D PYP 2020 not suggestive of amyloid followed by advanced heart failure clinic failed Entresto in the past due to hypotension maintained on losartan Toprol and dig and Lasix.  CAD with history of CABG in 2013 for Myoview if abnormal will need right and left heart cath  History of aortic stenosis status post TAVR 2019 valve okay on recent echo  LBBB  DM2  A1c 8.3  OSA on CPAP does not know how to use  Shared Decision Making/Informed Consent   {Are you ordering a CV Procedure (e.g. stress test, cath, DCCV, TEE, etc)?   Press F2        :[id number]}    Medication Adjustments/Labs and Tests Ordered: Current medicines are reviewed at length with the patient today.  Concerns regarding medicines are outlined above.  Medication changes, Labs and Tests ordered today are listed in the Patient Instructions below. There are no Patient Instructions on file for this visit.   Sumner Boast, PA-C  09/15/2021 9:39 AM    [facility] [address], [place], [place]  [address] Phone: [phone]; Fax: [phone]

## 2021-09-17 NOTE — Progress Notes (Signed)
EPIC Encounter for ICM Monitoring  Patient Name: [name] is a 71 y.o. male Date: 09/17/2021 Primary Care Physican: [name], MD Primary Cardiologist: Radford Pax Electrophysiologist: Allred 09/06/2021 Office Weight: 150 lbs                                                            Spoke with patient and heart failure questions reviewed.  Pt asymptomatic for fluid accumulation.  Reports feeling much better since hospitalization.   Diuresed at 1/16 Hospitalization.   CorVue thoracic impedance normal.   Prescribed: Furosemide 20 mg take 2 tablets (40 mg total) daily.   Spironolactone 25 mg take 0.5 tablet by mouth daily.   Labs: 09/06/2021 Creatinine 1.52, BUN 28, Potassium 4.3, Sodium 142, GFR 48 09/03/2021 Creatinine 1.49, BUN 20, Potassium 4.2, Sodium 143, GFR 50  09/02/2021 Creatinine 1.45, BUN 22, Potassium 3.5, Sodium 144, GFR 51  09/01/2021 Creatinine 1.39, BUN 23, Potassium 4.4, Sodium 139, GFR 54  08/31/2021 Creatinine 1.36, BUN 21, Potassium 3.7, Sodium 140, GFR 55  08/30/2021 Creatinine 1.33, BUN 23, Potassium 4.5, Sodium 139, GFR 57  08/23/2021 Creatinine 1.33, BUN 20, Potassium 4.6, Sodium 139   A complete set of results can be found in Results Review.   Recommendations:  No changes and encouraged to call if experiencing any fluid symptoms.   Follow-up plan: ICM clinic phone appointment on 10/18/2021.   91 day device clinic remote transmission 11/10/2021.     EP/Cardiology Office Visits:   10/06/2021 with Dr Haroldine Laws.  09/22/2021 with Estella Husk, NP.  Recall 05/14/2021 with Dr Rayann Heman.     Copy of ICM check sent to Dr. Rayann Heman.   3 month ICM trend: 09/14/2021.    12-14 Month ICM trend:     [name], RN 09/17/2021 11:12 AM

## 2021-09-20 ENCOUNTER — Ambulatory Visit (HOSPITAL_COMMUNITY): Payer: Medicare Other

## 2021-09-20 ENCOUNTER — Encounter (HOSPITAL_COMMUNITY): Payer: Self-pay

## 2021-09-22 ENCOUNTER — Ambulatory Visit: Payer: Medicare Other | Admitting: Physician Assistant

## 2021-09-22 ENCOUNTER — Telehealth (HOSPITAL_COMMUNITY): Payer: Self-pay

## 2021-09-22 NOTE — Telephone Encounter (Signed)
Attempted to contact the patient with instructions for his stress test. All numbers were either not in service, or no voicemail set up. S.Taysia Rivere EMTP

## 2021-09-28 ENCOUNTER — Other Ambulatory Visit: Payer: Self-pay

## 2021-09-28 ENCOUNTER — Ambulatory Visit (HOSPITAL_BASED_OUTPATIENT_CLINIC_OR_DEPARTMENT_OTHER): Payer: Medicare Other

## 2021-09-28 ENCOUNTER — Other Ambulatory Visit (HOSPITAL_COMMUNITY): Payer: Self-pay | Admitting: Physician Assistant

## 2021-09-28 DIAGNOSIS — Z953 Presence of xenogenic heart valve: Secondary | ICD-10-CM | POA: Diagnosis not present

## 2021-09-28 DIAGNOSIS — E785 Hyperlipidemia, unspecified: Secondary | ICD-10-CM | POA: Diagnosis present

## 2021-09-28 DIAGNOSIS — E611 Iron deficiency: Secondary | ICD-10-CM | POA: Diagnosis present

## 2021-09-28 DIAGNOSIS — I452 Bifascicular block: Secondary | ICD-10-CM | POA: Diagnosis present

## 2021-09-28 DIAGNOSIS — R0602 Shortness of breath: Secondary | ICD-10-CM | POA: Diagnosis not present

## 2021-09-28 DIAGNOSIS — Z79899 Other long term (current) drug therapy: Secondary | ICD-10-CM | POA: Diagnosis not present

## 2021-09-28 DIAGNOSIS — E11649 Type 2 diabetes mellitus with hypoglycemia without coma: Secondary | ICD-10-CM | POA: Diagnosis not present

## 2021-09-28 DIAGNOSIS — E1122 Type 2 diabetes mellitus with diabetic chronic kidney disease: Secondary | ICD-10-CM | POA: Diagnosis present

## 2021-09-28 DIAGNOSIS — I5043 Acute on chronic combined systolic (congestive) and diastolic (congestive) heart failure: Secondary | ICD-10-CM | POA: Diagnosis not present

## 2021-09-28 DIAGNOSIS — I5023 Acute on chronic systolic (congestive) heart failure: Secondary | ICD-10-CM | POA: Diagnosis present

## 2021-09-28 DIAGNOSIS — N179 Acute kidney failure, unspecified: Secondary | ICD-10-CM | POA: Diagnosis present

## 2021-09-28 DIAGNOSIS — E872 Acidosis, unspecified: Secondary | ICD-10-CM | POA: Diagnosis present

## 2021-09-28 DIAGNOSIS — I714 Abdominal aortic aneurysm, without rupture, unspecified: Secondary | ICD-10-CM | POA: Diagnosis present

## 2021-09-28 DIAGNOSIS — E039 Hypothyroidism, unspecified: Secondary | ICD-10-CM | POA: Diagnosis present

## 2021-09-28 DIAGNOSIS — I25118 Atherosclerotic heart disease of native coronary artery with other forms of angina pectoris: Secondary | ICD-10-CM | POA: Diagnosis not present

## 2021-09-28 DIAGNOSIS — E1140 Type 2 diabetes mellitus with diabetic neuropathy, unspecified: Secondary | ICD-10-CM | POA: Diagnosis present

## 2021-09-28 DIAGNOSIS — R778 Other specified abnormalities of plasma proteins: Secondary | ICD-10-CM | POA: Diagnosis present

## 2021-09-28 DIAGNOSIS — R64 Cachexia: Secondary | ICD-10-CM | POA: Diagnosis present

## 2021-09-28 DIAGNOSIS — I509 Heart failure, unspecified: Secondary | ICD-10-CM | POA: Diagnosis present

## 2021-09-28 DIAGNOSIS — Z951 Presence of aortocoronary bypass graft: Secondary | ICD-10-CM | POA: Diagnosis not present

## 2021-09-28 DIAGNOSIS — Z681 Body mass index (BMI) 19 or less, adult: Secondary | ICD-10-CM | POA: Diagnosis not present

## 2021-09-28 DIAGNOSIS — I2510 Atherosclerotic heart disease of native coronary artery without angina pectoris: Secondary | ICD-10-CM | POA: Diagnosis present

## 2021-09-28 DIAGNOSIS — I130 Hypertensive heart and chronic kidney disease with heart failure and stage 1 through stage 4 chronic kidney disease, or unspecified chronic kidney disease: Secondary | ICD-10-CM | POA: Diagnosis present

## 2021-09-28 DIAGNOSIS — I959 Hypotension, unspecified: Secondary | ICD-10-CM | POA: Diagnosis present

## 2021-09-28 DIAGNOSIS — I110 Hypertensive heart disease with heart failure: Secondary | ICD-10-CM | POA: Diagnosis not present

## 2021-09-28 DIAGNOSIS — N1831 Chronic kidney disease, stage 3a: Secondary | ICD-10-CM | POA: Diagnosis present

## 2021-09-28 DIAGNOSIS — Z794 Long term (current) use of insulin: Secondary | ICD-10-CM | POA: Diagnosis not present

## 2021-09-28 DIAGNOSIS — J90 Pleural effusion, not elsewhere classified: Secondary | ICD-10-CM | POA: Diagnosis not present

## 2021-09-28 DIAGNOSIS — G4733 Obstructive sleep apnea (adult) (pediatric): Secondary | ICD-10-CM | POA: Diagnosis present

## 2021-09-28 DIAGNOSIS — Z20822 Contact with and (suspected) exposure to covid-19: Secondary | ICD-10-CM | POA: Diagnosis present

## 2021-09-28 LAB — MYOCARDIAL PERFUSION IMAGING
LV dias vol: 168 mL (ref 62–150)
LV sys vol: 147 mL
Nuc Stress EF: 13 %
Peak HR: 95 {beats}/min
Rest HR: 94 {beats}/min
Rest Nuclear Isotope Dose: 9.1 mCi
SDS: 1
SRS: 0
SSS: 1
ST Depression (mm): 0 mm
Stress Nuclear Isotope Dose: 30.6 mCi
TID: 0.96

## 2021-09-28 MED ORDER — TECHNETIUM TC 99M TETROFOSMIN IV KIT
30.6000 | PACK | Freq: Once | INTRAVENOUS | Status: AC | PRN
  Administered 2021-09-28: 30.6 via INTRAVENOUS
  Filled 2021-09-28: qty 31

## 2021-09-28 MED ORDER — REGADENOSON 0.4 MG/5ML IV SOLN
0.4000 mg | Freq: Once | INTRAVENOUS | Status: AC
  Administered 2021-09-28: 0.4 mg via INTRAVENOUS

## 2021-09-28 MED ORDER — TECHNETIUM TC 99M TETROFOSMIN IV KIT
9.1000 | PACK | Freq: Once | INTRAVENOUS | Status: AC | PRN
  Administered 2021-09-28: 9.1 via INTRAVENOUS
  Filled 2021-09-28: qty 10

## 2021-09-30 ENCOUNTER — Emergency Department (HOSPITAL_COMMUNITY): Payer: Medicare Other

## 2021-09-30 ENCOUNTER — Telehealth: Payer: Self-pay | Admitting: Cardiology

## 2021-09-30 ENCOUNTER — Inpatient Hospital Stay (HOSPITAL_COMMUNITY)
Admission: EM | Admit: 2021-09-30 | Discharge: 2021-10-05 | DRG: 291 | Disposition: A | Discharge status: Home / Self Care | Payer: Medicare Other | Attending: Internal Medicine | Admitting: Internal Medicine

## 2021-09-30 ENCOUNTER — Other Ambulatory Visit: Payer: Self-pay

## 2021-09-30 DIAGNOSIS — E039 Hypothyroidism, unspecified: Secondary | ICD-10-CM

## 2021-09-30 DIAGNOSIS — Z79890 Hormone replacement therapy (postmenopausal): Secondary | ICD-10-CM

## 2021-09-30 DIAGNOSIS — Z888 Allergy status to other drugs, medicaments and biological substances status: Secondary | ICD-10-CM

## 2021-09-30 DIAGNOSIS — I5021 Acute systolic (congestive) heart failure: Secondary | ICD-10-CM

## 2021-09-30 DIAGNOSIS — Z681 Body mass index (BMI) 19 or less, adult: Secondary | ICD-10-CM | POA: Diagnosis not present

## 2021-09-30 DIAGNOSIS — Z7982 Long term (current) use of aspirin: Secondary | ICD-10-CM

## 2021-09-30 DIAGNOSIS — N179 Acute kidney failure, unspecified: Secondary | ICD-10-CM | POA: Diagnosis present

## 2021-09-30 DIAGNOSIS — Z951 Presence of aortocoronary bypass graft: Secondary | ICD-10-CM

## 2021-09-30 DIAGNOSIS — I959 Hypotension, unspecified: Secondary | ICD-10-CM | POA: Diagnosis present

## 2021-09-30 DIAGNOSIS — Z85118 Personal history of other malignant neoplasm of bronchus and lung: Secondary | ICD-10-CM

## 2021-09-30 DIAGNOSIS — E1122 Type 2 diabetes mellitus with diabetic chronic kidney disease: Secondary | ICD-10-CM | POA: Diagnosis present

## 2021-09-30 DIAGNOSIS — Z95810 Presence of automatic (implantable) cardiac defibrillator: Secondary | ICD-10-CM

## 2021-09-30 DIAGNOSIS — Z794 Long term (current) use of insulin: Secondary | ICD-10-CM

## 2021-09-30 DIAGNOSIS — E611 Iron deficiency: Secondary | ICD-10-CM | POA: Diagnosis present

## 2021-09-30 DIAGNOSIS — I714 Abdominal aortic aneurysm, without rupture, unspecified: Secondary | ICD-10-CM | POA: Diagnosis present

## 2021-09-30 DIAGNOSIS — E872 Acidosis, unspecified: Secondary | ICD-10-CM | POA: Diagnosis present

## 2021-09-30 DIAGNOSIS — I2510 Atherosclerotic heart disease of native coronary artery without angina pectoris: Secondary | ICD-10-CM | POA: Diagnosis present

## 2021-09-30 DIAGNOSIS — Z8673 Personal history of transient ischemic attack (TIA), and cerebral infarction without residual deficits: Secondary | ICD-10-CM

## 2021-09-30 DIAGNOSIS — N1831 Chronic kidney disease, stage 3a: Secondary | ICD-10-CM | POA: Diagnosis present

## 2021-09-30 DIAGNOSIS — K219 Gastro-esophageal reflux disease without esophagitis: Secondary | ICD-10-CM | POA: Diagnosis present

## 2021-09-30 DIAGNOSIS — I2582 Chronic total occlusion of coronary artery: Secondary | ICD-10-CM | POA: Diagnosis present

## 2021-09-30 DIAGNOSIS — Z91048 Other nonmedicinal substance allergy status: Secondary | ICD-10-CM

## 2021-09-30 DIAGNOSIS — R64 Cachexia: Secondary | ICD-10-CM | POA: Diagnosis present

## 2021-09-30 DIAGNOSIS — Z8249 Family history of ischemic heart disease and other diseases of the circulatory system: Secondary | ICD-10-CM

## 2021-09-30 DIAGNOSIS — Z953 Presence of xenogenic heart valve: Secondary | ICD-10-CM

## 2021-09-30 DIAGNOSIS — Z79899 Other long term (current) drug therapy: Secondary | ICD-10-CM | POA: Diagnosis not present

## 2021-09-30 DIAGNOSIS — E1140 Type 2 diabetes mellitus with diabetic neuropathy, unspecified: Secondary | ICD-10-CM | POA: Diagnosis present

## 2021-09-30 DIAGNOSIS — I509 Heart failure, unspecified: Secondary | ICD-10-CM | POA: Diagnosis present

## 2021-09-30 DIAGNOSIS — I25118 Atherosclerotic heart disease of native coronary artery with other forms of angina pectoris: Secondary | ICD-10-CM

## 2021-09-30 DIAGNOSIS — I110 Hypertensive heart disease with heart failure: Secondary | ICD-10-CM | POA: Diagnosis not present

## 2021-09-30 DIAGNOSIS — R778 Other specified abnormalities of plasma proteins: Secondary | ICD-10-CM | POA: Diagnosis present

## 2021-09-30 DIAGNOSIS — Z955 Presence of coronary angioplasty implant and graft: Secondary | ICD-10-CM

## 2021-09-30 DIAGNOSIS — E11649 Type 2 diabetes mellitus with hypoglycemia without coma: Secondary | ICD-10-CM | POA: Diagnosis not present

## 2021-09-30 DIAGNOSIS — I452 Bifascicular block: Secondary | ICD-10-CM | POA: Diagnosis present

## 2021-09-30 DIAGNOSIS — M1990 Unspecified osteoarthritis, unspecified site: Secondary | ICD-10-CM | POA: Diagnosis present

## 2021-09-30 DIAGNOSIS — I10 Essential (primary) hypertension: Secondary | ICD-10-CM | POA: Diagnosis present

## 2021-09-30 DIAGNOSIS — I130 Hypertensive heart and chronic kidney disease with heart failure and stage 1 through stage 4 chronic kidney disease, or unspecified chronic kidney disease: Secondary | ICD-10-CM | POA: Diagnosis present

## 2021-09-30 DIAGNOSIS — E1165 Type 2 diabetes mellitus with hyperglycemia: Secondary | ICD-10-CM | POA: Diagnosis present

## 2021-09-30 DIAGNOSIS — I5023 Acute on chronic systolic (congestive) heart failure: Secondary | ICD-10-CM | POA: Diagnosis present

## 2021-09-30 DIAGNOSIS — E119 Type 2 diabetes mellitus without complications: Secondary | ICD-10-CM

## 2021-09-30 DIAGNOSIS — G4733 Obstructive sleep apnea (adult) (pediatric): Secondary | ICD-10-CM | POA: Diagnosis present

## 2021-09-30 DIAGNOSIS — E785 Hyperlipidemia, unspecified: Secondary | ICD-10-CM | POA: Diagnosis present

## 2021-09-30 DIAGNOSIS — R54 Age-related physical debility: Secondary | ICD-10-CM | POA: Diagnosis present

## 2021-09-30 DIAGNOSIS — I5043 Acute on chronic combined systolic (congestive) and diastolic (congestive) heart failure: Secondary | ICD-10-CM | POA: Diagnosis not present

## 2021-09-30 DIAGNOSIS — Z20822 Contact with and (suspected) exposure to covid-19: Secondary | ICD-10-CM | POA: Diagnosis present

## 2021-09-30 DIAGNOSIS — Z833 Family history of diabetes mellitus: Secondary | ICD-10-CM

## 2021-09-30 DIAGNOSIS — R7989 Other specified abnormal findings of blood chemistry: Secondary | ICD-10-CM | POA: Diagnosis present

## 2021-09-30 DIAGNOSIS — J90 Pleural effusion, not elsewhere classified: Secondary | ICD-10-CM | POA: Diagnosis not present

## 2021-09-30 DIAGNOSIS — Z87442 Personal history of urinary calculi: Secondary | ICD-10-CM

## 2021-09-30 DIAGNOSIS — R0602 Shortness of breath: Secondary | ICD-10-CM | POA: Diagnosis not present

## 2021-09-30 DIAGNOSIS — Z923 Personal history of irradiation: Secondary | ICD-10-CM

## 2021-09-30 DIAGNOSIS — Z7984 Long term (current) use of oral hypoglycemic drugs: Secondary | ICD-10-CM

## 2021-09-30 LAB — CBC WITH DIFFERENTIAL/PLATELET
Abs Immature Granulocytes: 0.02 10*3/uL (ref 0.00–0.07)
Basophils Absolute: 0 10*3/uL (ref 0.0–0.1)
Basophils Relative: 1 %
Eosinophils Absolute: 0 10*3/uL (ref 0.0–0.5)
Eosinophils Relative: 1 %
HCT: 41.1 % (ref 39.0–52.0)
Hemoglobin: 13.1 g/dL (ref 13.0–17.0)
Immature Granulocytes: 0 %
Lymphocytes Relative: 14 %
Lymphs Abs: 0.7 10*3/uL (ref 0.7–4.0)
MCH: 23.8 pg — ABNORMAL LOW (ref 26.0–34.0)
MCHC: 31.9 g/dL (ref 30.0–36.0)
MCV: 74.6 fL — ABNORMAL LOW (ref 80.0–100.0)
Monocytes Absolute: 0.3 10*3/uL (ref 0.1–1.0)
Monocytes Relative: 7 %
Neutro Abs: 3.6 10*3/uL (ref 1.7–7.7)
Neutrophils Relative %: 77 %
Platelets: 312 10*3/uL (ref 150–400)
RBC: 5.51 MIL/uL (ref 4.22–5.81)
RDW: 17.6 % — ABNORMAL HIGH (ref 11.5–15.5)
WBC: 4.7 10*3/uL (ref 4.0–10.5)
nRBC: 1.3 % — ABNORMAL HIGH (ref 0.0–0.2)

## 2021-09-30 LAB — COMPREHENSIVE METABOLIC PANEL
ALT: 27 U/L (ref 0–44)
AST: 26 U/L (ref 15–41)
Albumin: 3.5 g/dL (ref 3.5–5.0)
Alkaline Phosphatase: 75 U/L (ref 38–126)
Anion gap: 14 (ref 5–15)
BUN: 29 mg/dL — ABNORMAL HIGH (ref 8–23)
CO2: 19 mmol/L — ABNORMAL LOW (ref 22–32)
Calcium: 8.9 mg/dL (ref 8.9–10.3)
Chloride: 105 mmol/L (ref 98–111)
Creatinine, Ser: 1.66 mg/dL — ABNORMAL HIGH (ref 0.61–1.24)
GFR, Estimated: 44 mL/min — ABNORMAL LOW (ref >60)
Glucose, Bld: 211 mg/dL — ABNORMAL HIGH (ref 70–99)
Potassium: 3.8 mmol/L (ref 3.5–5.1)
Sodium: 138 mmol/L (ref 135–145)
Total Bilirubin: 0.5 mg/dL (ref 0.3–1.2)
Total Protein: 7.2 g/dL (ref 6.5–8.1)

## 2021-09-30 LAB — RESP PANEL BY RT-PCR (FLU A&B, COVID) ARPGX2
Influenza A by PCR: NEGATIVE
Influenza B by PCR: NEGATIVE
SARS Coronavirus 2 by RT PCR: NEGATIVE

## 2021-09-30 LAB — TROPONIN I (HIGH SENSITIVITY)
Troponin I (High Sensitivity): 34 ng/L — ABNORMAL HIGH (ref <18)
Troponin I (High Sensitivity): 33 ng/L — ABNORMAL HIGH (ref <18)

## 2021-09-30 LAB — CBG MONITORING, ED: Glucose-Capillary: 125 mg/dL — ABNORMAL HIGH (ref 70–99)

## 2021-09-30 LAB — BRAIN NATRIURETIC PEPTIDE: B Natriuretic Peptide: 1964.7 pg/mL — ABNORMAL HIGH (ref 0.0–100.0)

## 2021-09-30 MED ORDER — ACETAMINOPHEN 325 MG PO TABS: 650.0000 mg | ORAL_TABLET | Freq: Four times a day (QID) | ORAL | Status: DC | PRN

## 2021-09-30 MED ORDER — INSULIN ASPART 100 UNIT/ML IJ SOLN: 0.0000 [IU] | Freq: Three times a day (TID) | INTRAMUSCULAR | Status: DC

## 2021-09-30 MED ORDER — AMITRIPTYLINE HCL 25 MG PO TABS
25.0000 mg | ORAL_TABLET | Freq: Every day | ORAL | Status: DC
  Administered 2021-10-01 – 2021-10-04 (×5): 25 mg via ORAL
  Filled 2021-09-30 (×6): qty 1

## 2021-09-30 MED ORDER — SIMVASTATIN 20 MG PO TABS
20.0000 mg | ORAL_TABLET | Freq: Every evening | ORAL | Status: DC
  Administered 2021-10-01: 20 mg via ORAL
  Filled 2021-09-30: qty 1

## 2021-09-30 MED ORDER — ZOLPIDEM TARTRATE 5 MG PO TABS
2.5000 mg | ORAL_TABLET | Freq: Every day | ORAL | Status: DC
  Administered 2021-10-01 – 2021-10-04 (×5): 2.5 mg via ORAL
  Filled 2021-09-30 (×5): qty 1

## 2021-09-30 MED ORDER — LEVOTHYROXINE SODIUM 50 MCG PO TABS
50.0000 ug | ORAL_TABLET | Freq: Every day | ORAL | Status: DC
  Administered 2021-10-01 – 2021-10-05 (×5): 50 ug via ORAL
  Filled 2021-09-30 (×5): qty 1
  Filled 2021-09-30: qty 2

## 2021-09-30 MED ORDER — ASPIRIN EC 81 MG PO TBEC
81.0000 mg | DELAYED_RELEASE_TABLET | Freq: Every day | ORAL | Status: DC
  Administered 2021-10-01 – 2021-10-05 (×5): 81 mg via ORAL
  Filled 2021-09-30 (×5): qty 1

## 2021-09-30 MED ORDER — ACETAMINOPHEN 650 MG RE SUPP: 650.0000 mg | Freq: Four times a day (QID) | RECTAL | Status: DC | PRN

## 2021-09-30 MED ORDER — POTASSIUM CHLORIDE 20 MEQ PO PACK
20.0000 meq | PACK | Freq: Once | ORAL | Status: AC
  Administered 2021-09-30: 20 meq via ORAL
  Filled 2021-09-30: qty 1

## 2021-09-30 MED ORDER — ENOXAPARIN SODIUM 40 MG/0.4ML IJ SOSY
40.0000 mg | PREFILLED_SYRINGE | INTRAMUSCULAR | Status: DC
  Administered 2021-10-01 – 2021-10-04 (×4): 40 mg via SUBCUTANEOUS
  Filled 2021-09-30 (×5): qty 0.4

## 2021-09-30 MED ORDER — DAPAGLIFLOZIN PROPANEDIOL 10 MG PO TABS
10.0000 mg | ORAL_TABLET | Freq: Every day | ORAL | Status: DC
  Filled 2021-09-30: qty 1

## 2021-09-30 MED ORDER — FUROSEMIDE 10 MG/ML IJ SOLN
40.0000 mg | Freq: Once | INTRAMUSCULAR | Status: AC
Start: 2021-09-30 — End: 2021-09-30
  Administered 2021-09-30: 40 mg via INTRAVENOUS
  Filled 2021-09-30: qty 4

## 2021-09-30 MED ORDER — INSULIN ASPART 100 UNIT/ML IJ SOLN
0.0000 [IU] | Freq: Every day | INTRAMUSCULAR | Status: DC
  Administered 2021-10-02: 4 [IU] via SUBCUTANEOUS
  Administered 2021-10-03 – 2021-10-04 (×2): 5 [IU] via SUBCUTANEOUS

## 2021-09-30 MED ORDER — FUROSEMIDE 10 MG/ML IJ SOLN
80.0000 mg | Freq: Two times a day (BID) | INTRAMUSCULAR | Status: DC
  Administered 2021-09-30 – 2021-10-03 (×6): 80 mg via INTRAVENOUS
  Filled 2021-09-30 (×6): qty 8

## 2021-09-30 MED ORDER — METOPROLOL SUCCINATE ER 100 MG PO TB24
100.0000 mg | ORAL_TABLET | Freq: Every day | ORAL | Status: DC
  Administered 2021-10-01: 100 mg via ORAL
  Filled 2021-09-30: qty 4

## 2021-09-30 MED ORDER — INSULIN DETEMIR 100 UNIT/ML ~~LOC~~ SOLN
20.0000 [IU] | Freq: Two times a day (BID) | SUBCUTANEOUS | Status: DC
  Administered 2021-10-01 (×3): 20 [IU] via SUBCUTANEOUS
  Filled 2021-09-30 (×5): qty 0.2

## 2021-09-30 MED ORDER — DIGOXIN 125 MCG PO TABS
0.1250 mg | ORAL_TABLET | Freq: Every day | ORAL | Status: DC
  Administered 2021-10-01 – 2021-10-05 (×5): 0.125 mg via ORAL
  Filled 2021-09-30 (×5): qty 1

## 2021-09-30 NOTE — ED Provider Notes (Signed)
[facility] EMERGENCY DEPARTMENT Provider Note   CSN: [id number] Arrival date & time: 09/30/21  2505     History  Chief Complaint  Patient presents with   Shortness of Breath    [name] is a 71 y.o. male.  HPI     71 year old male comes in with chief complaint of shortness of breath.  He has history of advanced CHF with a EF of 20%, aortic valve stenosis status post TAVR, AICD placement, diabetes, CKD and hypertension.  Patient indicates that he was hospitalized last month for CHF exacerbation.  Subsequently he has not seen significant improvement in his symptoms.  He continues to have exertional shortness of breath.  Walking about 20 feet gets him short of breath now.  When things are well, he is able to walk 20 feet without getting short of breath.  He denies any associated chest pain.  He has orthopnea, but it is chronic in nature.  Patient denies any significant leg swelling.  Home Medications Prior to Admission medications   Medication Sig Start Date End Date Taking? Authorizing Provider  amitriptyline (ELAVIL) 25 MG tablet Take 25 mg by mouth at bedtime.    Yes [provider]  aspirin EC 81 MG EC tablet Take 1 tablet (81 mg total) by mouth daily. Swallow whole. 09/04/21  Yes Domenic Polite, MD  B Complex-C (SUPER B COMPLEX PO) Take 1 tablet by mouth daily.   Yes [provider]  dapagliflozin propanediol (FARXIGA) 10 MG TABS tablet Take 1 tablet (10 mg total) by mouth daily. 09/04/21  Yes Domenic Polite, MD  digoxin (LANOXIN) 0.125 MG tablet Take 1 tablet (0.125 mg total) by mouth daily. 09/06/21  Yes Clegg, Amy D, NP  furosemide (LASIX) 20 MG tablet Take 2 tablets (40 mg total) by mouth daily. 09/03/21  Yes Domenic Polite, MD  insulin detemir (LEVEMIR) 100 UNIT/ML injection Inject 10-18 Units into the skin See admin instructions. 18 units in the morning, 10 units at bedtime   Yes [provider]  insulin lispro (HUMALOG)  100 UNIT/ML KwikPen Inject 2-3 Units into the skin See admin instructions. Per sliding scale at Breakfast and Dinner 03/26/19  Yes [provider]  levothyroxine (SYNTHROID, LEVOTHROID) 50 MCG tablet Take 50 mcg by mouth daily before breakfast.    Yes [provider]  losartan (COZAAR) 25 MG tablet TAKE 1 TABLET(25 MG) BY MOUTH DAILY 02/22/21  Yes Turner, Eber Hong, MD  metFORMIN (GLUCOPHAGE) 500 MG tablet Take 2 tablets (1,000 mg total) by mouth 2 (two) times daily with a meal. 05/17/18  Yes Eileen Stanford, PA-C  metoprolol succinate (TOPROL-XL) 100 MG 24 hr tablet Take 1 tablet (100 mg total) by mouth daily. Take with or immediately following a meal. 08/04/21  Yes Turner, Eber Hong, MD  Multiple Vitamin (MULTIVITAMIN) tablet Take 1 tablet by mouth daily.   Yes [provider]  simvastatin (ZOCOR) 20 MG tablet Take 20 mg by mouth every evening.    Yes [provider]  spironolactone (ALDACTONE) 25 MG tablet Take 1/2 tablet (12.5 mg total) by mouth daily. 09/04/21  Yes Domenic Polite, MD  zolpidem (AMBIEN) 5 MG tablet Take 2.5 mg by mouth at bedtime. 12/26/19  Yes [provider]  B-D ULTRAFINE III SHORT PEN 31G X 8 MM MISC 1 each by Other route daily as needed (GLUCOSE TESTING (INSULINE NEEDLE)).  04/22/13   [provider]  Omega-3 Fatty Acids (FISH OIL PO) Take 1 tablet by  mouth daily. Patient not taking: Reported on 09/30/2021    [provider]  ONE TOUCH ULTRA TEST test strip 1 each by Other route as needed for other.  01/15/13   [provider]      Allergies    Atorvastatin, Entresto [sacubitril-valsartan], and Adhesive [tape]    Review of Systems   Review of Systems  Constitutional:  Positive for activity change.  Respiratory:  Positive for shortness of breath.   All other systems reviewed and are negative.  Physical Exam Updated Vital Signs BP 110/83    Pulse 89    Resp (!) 25    SpO2 100%  Physical Exam Vitals and  nursing note reviewed.  Constitutional:      Appearance: He is well-developed.  HENT:     Head: Atraumatic.  Neck:     Vascular: JVD present.     Comments: Positive JVD Cardiovascular:     Rate and Rhythm: Normal rate.  Pulmonary:     Effort: Pulmonary effort is normal.     Breath sounds: No decreased breath sounds, wheezing, rhonchi or rales.  Musculoskeletal:     Cervical back: Neck supple.     Right lower leg: Edema present.     Left lower leg: Edema present.     Comments: Trace bilateral lower extremity pitting edema  Skin:    General: Skin is warm.  Neurological:     Mental Status: He is alert and oriented to person, place, and time.    ED Results / Procedures / Treatments   Labs (all labs ordered are listed, but only abnormal results are displayed) Labs Reviewed  COMPREHENSIVE METABOLIC PANEL - Abnormal; Notable for the following components:      Result Value   CO2 19 (*)    Glucose, Bld 211 (*)    BUN 29 (*)    Creatinine, Ser 1.66 (*)    GFR, Estimated 44 (*)    All other components within normal limits  CBC WITH DIFFERENTIAL/PLATELET - Abnormal; Notable for the following components:   MCV 74.6 (*)    MCH 23.8 (*)    RDW 17.6 (*)    nRBC 1.3 (*)    All other components within normal limits  BRAIN NATRIURETIC PEPTIDE - Abnormal; Notable for the following components:   B Natriuretic Peptide 1,964.7 (*)    All other components within normal limits  TROPONIN I (HIGH SENSITIVITY) - Abnormal; Notable for the following components:   Troponin I (High Sensitivity) 34 (*)    All other components within normal limits  TROPONIN I (HIGH SENSITIVITY) - Abnormal; Notable for the following components:   Troponin I (High Sensitivity) 33 (*)    All other components within normal limits  RESP PANEL BY RT-PCR (FLU A&B, COVID) [id number]  DIGOXIN LEVEL    EKG EKG Interpretation  Date/Time:  Thursday September 30 2021 10:02:04 EST Ventricular Rate:  92 PR Interval:  188 QRS  Duration: 150 QT Interval:  430 QTC Calculation: 531 R Axis:   148 Text Interpretation: Normal sinus rhythm Right bundle branch block Left posterior fascicular block T wave abnormality, consider inferolateral ischemia TWI not new No significant change since last tracing Confirmed by Varney Biles [id number]) on 09/30/2021 4:22:02 PM  Radiology DG Chest 2 View  Result Date: 09/30/2021 CLINICAL DATA:  Shortness of breath EXAM: CHEST - 2 VIEW COMPARISON:  Chest x-ray 08/30/2021 FINDINGS: Heart size is normal. Mediastinum appears stable. Cardiac surgical changes and median sternotomy wires. Left-sided cardiac pacemaker  device. Pulmonary vasculature is within normal limits. Stable appearing elevated left hemidiaphragm and adjacent opacities in the left lower lung zone which may represent scarring/atelectasis. Stable small right pleural effusion. Right apical pleural thickening. No pneumothorax visualized. IMPRESSION: No change since previous study. Small right pleural effusion, elevated left hemidiaphragm and adjacent likely scarring/atelectatic changes in the left lower lung zone. Electronically Signed   By: Ofilia Neas M.D.   On: 09/30/2021 10:36    Procedures Procedures    Medications Ordered in ED Medications  furosemide (LASIX) injection 40 mg (40 mg Intravenous Given 09/30/21 1616)    ED Course/ Medical Decision Making/ A&P Clinical Course as of 09/30/21 1628  Thu Sep 30, 2021  1626 B Natriuretic Peptide(!): 1,964.7 Higher than previous values. [AN]  1626 Troponin I (High Sensitivity)(!): 33 Troponin is also higher compared to previous values.  This could be prognostic marker for the CHF. [AN]    Clinical Course User Index [AN] Varney Biles, MD                           Medical Decision Making Amount and/or Complexity of Data Reviewed Labs: ordered.  Risk Prescription drug management.   This patient presents to the ED with chief complaint(s) of shortness of breath with  pertinent past medical history of advanced CHF with EF of 20%, hypertension, aortic stenosis status post TAVR which further complicates the presenting complaint. The complaint involves an extensive differential diagnosis and treatment options and also carries with it a high risk of complications and morbidity.  On exam, patient does not have any significant pitting edema.  He does have a mild JVD and tachypnea without any evidence of rales.  The differential diagnosis includes : CHF exacerbation, ACS, pleural effusion, pulmonary edema, symptomatic anemia, valve dysfunction  The initial plan is to order basic labs, troponins and to treat patient with IV Lasix for likely some volume overload.  Patient likely has exacerbation of his CHF.  We have ordered his weight.  It appears that his dry weight is 152 pounds per cardiology note.  We have also ordered ambulatory pulse ox.  It seems like patient likely has mild over load.  If he does not have significant weight gain, is able to ambulate without significant discomfort then we might consider discharging him with double the Lasix for 3 days and close outpatient cardiology follow-up.  If however patient is struggling with ambulation then we might have to consider admitting him to the hospital.   Additional history obtained: Records reviewed previous admission documents  Reassessment and review (also see workup area): Lab Tests: I Ordered, and personally interpreted labs.  The pertinent results include: Elevated BNP over 1800, CBC showing no significant anemia.  Troponins elevated in the low 30s  Imaging Studies: I ordered and independently visualized and interpreted the following imaging X-ray of the chest   which showed slight pleural effusion without overt pulmonary edema or pneumothorax The interpretation of the imaging was limited to assessing for emergent pathology, for which purpose it was ordered.   Medicines ordered and prescription drug  management: I ordered the following medications IV Lasix for diuresing Patient also placed on 2 L per nasal cannula.   Cardiac Monitoring: The patient was maintained on a cardiac monitor.  I personally viewed and interpreted the cardiac monitor which showed an underlying rhythm of:  sinus rhythm  Complexity of problems addressed: Patients presentation is most consistent with  severe exacerbation of  chronic illness During patient's assessment  Disposition: Awaiting functional test, however with elevated troponin, significantly elevated BNP compared to previous admission and mild tachypnea at rest -anticipate that incoming team might have to admit the patient.  Final Clinical Impression(s) / ED Diagnoses Final diagnoses:  Acute systolic congestive heart failure [facility])    Rx / DC Orders ED Discharge Orders     None         Varney Biles, MD 09/30/21 [phone]

## 2021-09-30 NOTE — Assessment & Plan Note (Addendum)
Likely related to diuretic and Farxiga use. -Hold Farxiga at this time and repeat labs in the morning.

## 2021-09-30 NOTE — ED Notes (Signed)
Unable to obatin oral or ax temp. Will try again

## 2021-09-30 NOTE — ED Provider Notes (Addendum)
Patient received Lasix.  Had reasonable urine output but not a large amount.  Following that patient was ambulated.  Patient's respiratory rate was somewhat up prior to ambulation.  Patient got significantly short of breath and winded while ambulating.  Respiratory rate went up into the 40s.  Patient's oxygen sats did not drop below 90%.  We will discuss with cardiology feel that patient requires admission for exacerbation of CHF.   Fredia Sorrow, MD 09/30/21 1810   Discussed with [facility] cardiology coverage for overnight Dr. Gasper Sells.  He is recommending hospitalist admission patient was recently discharged January 20 with hospitalist service.   Fredia Sorrow, MD 09/30/21 [phone]

## 2021-09-30 NOTE — Assessment & Plan Note (Addendum)
Patient appears volume overloaded on exam.  BNP significantly elevated and chest x-ray showing small right pleural effusion.  Status post AICD and echo done a month ago showing EF 20 to 25% and severely decreased LV function.  He is not hypoxic at present but does become quite tachypneic with minimal movement. CHF decompensation is likely related to dietary indiscretion. -Cardiology started IV Lasix 80 mg twice daily.  Continue home Toprol and digoxin.  Hold losartan and spironolactone at this time given AKI.  Hold Farxiga at this time given metabolic acidosis.  Check digoxin and TSH levels in AM.  QRS appears widened persistently, cardiology planning on biventricular pacing optimizationprior to discharge.  Also cardiology considering peri-discharge RHC.  Monitor intake and output, daily weights.  Low-sodium diet with fluid restriction.  Monitor renal function closely.  Supplemental oxygen as needed.

## 2021-09-30 NOTE — Assessment & Plan Note (Signed)
-  Continue Synthroid and check TSH level.

## 2021-09-30 NOTE — Assessment & Plan Note (Addendum)
A1c 8.3 a month ago.  CBG currently in the 200s. -Continue home Levemir -patient takes 20 units twice daily. Order sliding scale insulin sensitive ACHS.

## 2021-09-30 NOTE — Telephone Encounter (Signed)
Patient called in reporting his dyspnea has gotten worse over the past 2 days. Weights have been variable, but increasing LE edema. Reports compliance with his home medications. Urine output has been ok. I advised he could try to increase his 40mg  daily lasix to 40mg  BID for the next 2 days and see if there's improvement. He advised he was concerned about things, feels he need IV lasix. Advised he would need to come to the ED for further evaluation, which he was agreeable too.

## 2021-09-30 NOTE — Assessment & Plan Note (Addendum)
Stable. -Continue Toprol and Lasix.  Hold losartan and spironolactone given AKI.

## 2021-09-30 NOTE — Assessment & Plan Note (Addendum)
Chest pain CAD status post CABG Patient reports a few episodes of nonexertional left-sided chest pain last week, most recent episode 3 days ago.  Nuclear medicine stress test done 2 days ago was high risk.  High-sensitivity troponin mildly elevated but stable.  Currently chest pain-free. -Cardiology consulted.  Continue aspirin, beta-blocker, and statin.

## 2021-09-30 NOTE — H&P (Signed)
History and Physical    [name] JHE:[id number] DOB: 25-Oct-1950 DOA: 09/30/2021  PCP: [name], MD  Patient coming from: Home  HPI: [name] is a 71 y.o. male with medical history significant of chronic systolic CHF (EF 18-56 %) status post AICD, insulin-dependent type 2 diabetes, hypertension, hyperlipidemia, CAD status post CABG, LBBB, aortic stenosis status post TAVR, history of lung cancer status post radiation, CKD stage IIIa, aortic aneurysm, CVA, hypothyroidism, OSA.  Admitted a month ago for CHF exacerbation and was discharged on Lasix 40 mg daily.  Ivabradine was discontinued during this hospitalization due to cost issues and nonadherence.  Not on Entresto due to hypotension, started on low-dose Aldactone and Farxiga during this hospitalization.  He had a follow-up visit with cardiology on 1/23 and per note, continued on Lasix 40 mg twice daily.  Patient called cardiology office today complaining of increasing dyspnea over the past 2 days associated with weight gain and increasing lower extremity edema.  Labs showing bicarb 19, anion gap 14.  Blood glucose 211.  BUN 29, creatinine 1.6 (baseline 1.2).  COVID and flu negative.  High-sensitivity troponin 34 >33.  BNP 1964.  Chest x-ray showing small right pleural effusion, elevated left hemidiaphragm and adjacent likely scarring/atelectatic changes in the left lower lung zone. Patient was given IV Lasix 40 mg in the ED and had reasonable urine output but not a large amount.  When he ambulated, he became quite dyspneic with respiratory rate in the 40s but oxygen saturation did not drop below 90%.  Cardiology consulted and advised admission.  Patient reports progressively worsening dyspnea on exertion and bilateral lower extremity edema.  States his weight has been fluctuating.  He takes Lasix 40 mg daily and has not missed any doses.  Does mention that he is currently undergoing divorce and eating out a lot.  He likes eating [employer],  cheese and fries.  Reports a few episodes of nonexertional left-sided chest pain last week and had another episode 3 days ago.  No chest pain at present.  No other complaints.  Review of Systems:  All systems reviewed and apart from history of presenting illness, are negative.  Past Medical History:  Diagnosis Date   AICD (automatic cardioverter/defibrillator) present 02/11/2020   Anemia    Arthritis    Atrial tachycardia (HCC)    Chronic systolic CHF (congestive heart failure) ([place]) 07/04/2018   CKD (chronic kidney disease), stage III ([place])    Coronary artery disease 02/2012   a. s/p stenting in 2013  b. s/p CABGx2V (SVG--> PDA, SVG--> LCx).   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  Intolerant to PAP therapy   S/P TAVR (transcatheter aortic valve replacement) 05/15/2018   29 mm Edwards Sapien 3 transcatheter heart valve placed via percutaneous right transfemoral approach    Severe aortic stenosis     Past Surgical  History:  Procedure Laterality Date   BIV ICD INSERTION CRT-D N/A 02/11/2020   Procedure: BIV ICD INSERTION CRT-D;  Surgeon: Thompson Grayer, MD;  Location: [place] [facility];  Service: Cardiovascular;  Laterality: N/A;   COLONOSCOPY  07/25/2012   Procedure: COLONOSCOPY;  Surgeon: Winfield Cunas., MD;  Location: [facility] ENDOSCOPY;  Service: Endoscopy;  Laterality: N/A;   COLONOSCOPY N/A 12/02/2013   Procedure:  COLONOSCOPY;  Surgeon: Winfield Cunas., MD;  Location: [facility] ENDOSCOPY;  Service: Endoscopy;  Laterality: N/A;   CORONARY ARTERY BYPASS GRAFT N/A 01/04/2013   Procedure: CORONARY ARTERY BYPASS GRAFTING (CABG) times two using right saphenous vein harvested with endoscope.;  Surgeon: Ivin Poot, MD;  Location: [place];  Service: Open Heart Surgery;  Laterality: N/A;   ESOPHAGOGASTRODUODENOSCOPY  07/20/2012   Procedure: ESOPHAGOGASTRODUODENOSCOPY (EGD);  Surgeon: Winfield Cunas., MD;  Location: [facility] ENDOSCOPY;  Service: Endoscopy;  Laterality: N/A;   ESOPHAGOGASTRODUODENOSCOPY N/A 12/02/2013   Procedure: ESOPHAGOGASTRODUODENOSCOPY (EGD);  Surgeon: Winfield Cunas., MD;  Location: [facility] ENDOSCOPY;  Service: Endoscopy;  Laterality: N/A;   HOT HEMOSTASIS N/A 12/02/2013   Procedure: HOT HEMOSTASIS (ARGON PLASMA COAGULATION/BICAP);  Surgeon: Winfield Cunas., MD;  Location: [facility] ENDOSCOPY;  Service: Endoscopy;  Laterality: N/A;   INTRAOPERATIVE TRANSESOPHAGEAL ECHOCARDIOGRAM N/A 01/04/2013   Procedure: INTRAOPERATIVE TRANSESOPHAGEAL ECHOCARDIOGRAM;  Surgeon: Ivin Poot, MD;  Location: [place];  Service: Open Heart Surgery;  Laterality: N/A;   INTRAOPERATIVE TRANSTHORACIC ECHOCARDIOGRAM N/A 05/15/2018   Procedure: INTRAOPERATIVE TRANSTHORACIC ECHOCARDIOGRAM;  Surgeon: Burnell Blanks, MD;  Location: [place];  Service: Open Heart Surgery;  Laterality: N/A;   IR THORACENTESIS ASP PLEURAL SPACE W/IMG GUIDE  10/19/2018   LEFT HEART CATHETERIZATION WITH CORONARY ANGIOGRAM N/A 03/06/2012   Procedure: LEFT HEART CATHETERIZATION WITH CORONARY ANGIOGRAM;  Surgeon: [name], MD;  Location: [facility] CATH LAB;  Service: Cardiovascular;  Laterality: N/A;   LITHOTRIPSY     [place] Left 01/04/2013   Procedure: RADIAL ARTERY HARVEST;  Surgeon: Ivin Poot, MD;  Location: [place];  Service: Vascular;  Laterality: Left;  Artery not havested. Unsuitable for use.   resection  mediastinal seminonma     RIGHT/LEFT HEART CATH AND CORONARY/GRAFT ANGIOGRAPHY N/A 11/15/2016   Procedure: Right/Left Heart Cath and Coronary/Graft Angiography;  Surgeon: Leonie Man, MD;  Location: [place] [facility];  Service: Cardiovascular;  Laterality: N/A;   RIGHT/LEFT HEART CATH AND CORONARY/GRAFT ANGIOGRAPHY N/A 04/13/2018   Procedure: RIGHT/LEFT HEART CATH AND CORONARY/GRAFT ANGIOGRAPHY;  Surgeon: Jolaine Artist, MD;  Location: [place] [facility];  Service: Cardiovascular;  Laterality: N/A;   TRANSCATHETER AORTIC VALVE REPLACEMENT, TRANSFEMORAL  05/15/2018   TRANSCATHETER AORTIC VALVE REPLACEMENT, TRANSFEMORAL N/A 05/15/2018   Procedure: TRANSCATHETER AORTIC VALVE REPLACEMENT, TRANSFEMORAL;  Surgeon: Burnell Blanks, MD;  Location: [facility] [place];  Service: Open Heart Surgery;  Laterality: N/A;     reports that he quit smoking about 36 years ago. His smoking use included cigarettes. He has never used smokeless tobacco. He reports that he does not drink alcohol and does not use drugs.  Allergies  Allergen Reactions   Atorvastatin Other (See Comments)    Muscle pain Other reaction(s): cramps   Entresto [Sacubitril-Valsartan] Other (See Comments)    hypotension   Adhesive [Tape] Rash    Family History  Problem Relation Age of Onset   Diabetes Father    Diabetes Mother    Heart failure Mother  Hypertension Mother    Cancer Brother    Diabetes Sister     Prior to Admission medications   Medication Sig Start Date End Date Taking? Authorizing Provider  amitriptyline (ELAVIL) 25 MG tablet Take 25 mg by mouth at bedtime.    Yes [provider]  aspirin EC 81 MG EC tablet Take 1 tablet (81 mg total) by mouth daily. Swallow whole. 09/04/21  Yes Domenic Polite, MD  B Complex-C (SUPER B COMPLEX PO) Take 1 tablet by mouth daily.   Yes [provider]  dapagliflozin propanediol (FARXIGA) 10 MG TABS tablet Take 1 tablet (10 mg total) by mouth daily. 09/04/21  Yes  Domenic Polite, MD  digoxin (LANOXIN) 0.125 MG tablet Take 1 tablet (0.125 mg total) by mouth daily. 09/06/21  Yes Clegg, Amy D, NP  furosemide (LASIX) 20 MG tablet Take 2 tablets (40 mg total) by mouth daily. 09/03/21  Yes Domenic Polite, MD  insulin detemir (LEVEMIR) 100 UNIT/ML injection Inject 10-18 Units into the skin See admin instructions. 18 units in the morning, 10 units at bedtime   Yes [provider]  insulin lispro (HUMALOG) 100 UNIT/ML KwikPen Inject 2-3 Units into the skin See admin instructions. Per sliding scale at Breakfast and Dinner 03/26/19  Yes [provider]  levothyroxine (SYNTHROID, LEVOTHROID) 50 MCG tablet Take 50 mcg by mouth daily before breakfast.    Yes [provider]  losartan (COZAAR) 25 MG tablet TAKE 1 TABLET(25 MG) BY MOUTH DAILY 02/22/21  Yes Turner, [name] Hong, MD  metFORMIN (GLUCOPHAGE) 500 MG tablet Take 2 tablets (1,000 mg total) by mouth 2 (two) times daily with a meal. 05/17/18  Yes Eileen Stanford, PA-C  metoprolol succinate (TOPROL-XL) 100 MG 24 hr tablet Take 1 tablet (100 mg total) by mouth daily. Take with or immediately following a meal. 08/04/21  Yes Turner, [name] Hong, MD  Multiple Vitamin (MULTIVITAMIN) tablet Take 1 tablet by mouth daily.   Yes [provider]  simvastatin (ZOCOR) 20 MG tablet Take 20 mg by mouth every evening.    Yes [provider]  spironolactone (ALDACTONE) 25 MG tablet Take 1/2 tablet (12.5 mg total) by mouth daily. 09/04/21  Yes Domenic Polite, MD  zolpidem (AMBIEN) 5 MG tablet Take 2.5 mg by mouth at bedtime. 12/26/19  Yes [provider]  B-D ULTRAFINE III SHORT PEN 31G X 8 MM MISC 1 each by Other route daily as needed (GLUCOSE TESTING (INSULINE NEEDLE)).  04/22/13   [provider]  Omega-3 Fatty Acids (FISH OIL PO) Take 1 tablet by mouth daily. Patient not taking: Reported on 09/30/2021    [provider]  ONE TOUCH ULTRA TEST test strip 1 each by Other  route as needed for other.  01/15/13   [provider]    Physical Exam: Vitals:   09/30/21 2115 09/30/21 2200 09/30/21 2252 09/30/21 2309  BP:      Pulse:  95 93 95  Resp:  (!) 22 (!) 30 (!) 30  Temp:      SpO2: 97% 100% 100% 97%  Weight:        Physical Exam Constitutional:      General: He is not in acute distress. HENT:     Head: Normocephalic and atraumatic.  Eyes:     Extraocular Movements: Extraocular movements intact.     Conjunctiva/sclera: Conjunctivae normal.  Neck:     Comments: +JVD Cardiovascular:     Rate and Rhythm: Normal rate and regular rhythm.  Pulses: Normal pulses.  Pulmonary:     Effort: No respiratory distress.     Breath sounds: No wheezing or rales.     Comments: Tachypneic with minimal movement Abdominal:     General: Bowel sounds are normal. There is no distension.     Palpations: Abdomen is soft.     Tenderness: There is no abdominal tenderness.  Musculoskeletal:     Cervical back: Normal range of motion and neck supple.     Right lower leg: Edema present.     Left lower leg: Edema present.  Skin:    General: Skin is warm and dry.  Neurological:     General: No focal deficit present.     Mental Status: He is alert and oriented to person, place, and time.     Labs on Admission: I have personally reviewed following labs and imaging studies  CBC: Recent Labs  Lab 09/30/21 1014  WBC 4.7  NEUTROABS 3.6  HGB 13.1  HCT 41.1  MCV 74.6*  PLT 595   Basic Metabolic Panel: Recent Labs  Lab 09/30/21 1014  NA 138  K 3.8  CL 105  CO2 19*  GLUCOSE 211*  BUN 29*  CREATININE 1.66*  CALCIUM 8.9   GFR: Estimated Creatinine Clearance: 42.8 mL/min (A) (by C-G formula based on SCr of 1.66 mg/dL (H)). Liver Function Tests: Recent Labs  Lab 09/30/21 1014  AST 26  ALT 27  ALKPHOS 75  BILITOT 0.5  PROT 7.2  ALBUMIN 3.5   No results for input(s): LIPASE, AMYLASE in the last 168 hours. No results for input(s): AMMONIA in  the last 168 hours. Coagulation Profile: No results for input(s): INR, PROTIME in the last 168 hours. Cardiac Enzymes: No results for input(s): CKTOTAL, CKMB, CKMBINDEX, TROPONINI in the last 168 hours. BNP (last 3 results) No results for input(s): PROBNP in the last 8760 hours. HbA1C: No results for input(s): HGBA1C in the last 72 hours. CBG: Recent Labs  Lab 09/30/21 2222  GLUCAP 125*   Lipid Profile: No results for input(s): CHOL, HDL, LDLCALC, TRIG, CHOLHDL, LDLDIRECT in the last 72 hours. Thyroid Function Tests: No results for input(s): TSH, T4TOTAL, FREET4, T3FREE, THYROIDAB in the last 72 hours. Anemia Panel: No results for input(s): VITAMINB12, FOLATE, FERRITIN, TIBC, IRON, RETICCTPCT in the last 72 hours. Urine analysis:    Component Value Date/Time   COLORURINE YELLOW 05/11/2018 1209   APPEARANCEUR HAZY (A) 05/11/2018 1209   LABSPEC 1.011 05/11/2018 1209   PHURINE 5.0 05/11/2018 1209   GLUCOSEU >=500 (A) 05/11/2018 1209   HGBUR LARGE (A) 05/11/2018 1209   BILIRUBINUR NEGATIVE 05/11/2018 1209   KETONESUR NEGATIVE 05/11/2018 1209   PROTEINUR NEGATIVE 05/11/2018 1209   UROBILINOGEN 1.0 01/01/2013 1157   NITRITE NEGATIVE 05/11/2018 1209   LEUKOCYTESUR NEGATIVE 05/11/2018 1209    Radiological Exams on Admission: I have personally reviewed images DG Chest 2 View  Result Date: 09/30/2021 CLINICAL DATA:  Shortness of breath EXAM: CHEST - 2 VIEW COMPARISON:  Chest x-ray 08/30/2021 FINDINGS: Heart size is normal. Mediastinum appears stable. Cardiac surgical changes and median sternotomy wires. Left-sided cardiac pacemaker device. Pulmonary vasculature is within normal limits. Stable appearing elevated left hemidiaphragm and adjacent opacities in the left lower lung zone which may represent scarring/atelectasis. Stable small right pleural effusion. Right apical pleural thickening. No pneumothorax visualized. IMPRESSION: No change since previous study. Small right pleural  effusion, elevated left hemidiaphragm and adjacent likely scarring/atelectatic changes in the left lower lung zone. Electronically Signed  By: Ofilia Neas M.D.   On: 09/30/2021 10:36    EKG: I have personally reviewed EKG: Sinus rhythm.  LBBB is not new.  T wave abnormality seen in inferior leads seen on prior tracing from October 2021 as well.  Assessment and Plan: * Acute on chronic systolic CHF (congestive heart failure) ([place])- (present on admission) Patient appears volume overloaded on exam.  BNP significantly elevated and chest x-ray showing small right pleural effusion.  Status post AICD and echo done a month ago showing EF 20 to 25% and severely decreased LV function.  He is not hypoxic at present but does become quite tachypneic with minimal movement. CHF decompensation is likely related to dietary indiscretion. -Cardiology started IV Lasix 80 mg twice daily.  Continue home Toprol and digoxin.  Hold losartan and spironolactone at this time given AKI.  Hold Farxiga at this time given metabolic acidosis.  Check digoxin and TSH levels in AM.  QRS appears widened persistently, cardiology planning on biventricular pacing optimizationprior to discharge.  Also cardiology considering peri-discharge RHC.  Monitor intake and output, daily weights.  Low-sodium diet with fluid restriction.  Monitor renal function closely.  Supplemental oxygen as needed.  Elevated troponin- (present on admission) Chest pain CAD status post CABG Patient reports a few episodes of nonexertional left-sided chest pain last week, most recent episode 3 days ago.  Nuclear medicine stress test done 2 days ago was high risk.  High-sensitivity troponin mildly elevated but stable.  Currently chest pain-free. -Cardiology consulted.  Continue aspirin, beta-blocker, and statin.  Acute renal failure superimposed on stage 3a chronic kidney disease ([place])- (present on admission) Likely cardiorenal from acutely decompensated CHF.   Creatinine currently 1.6, baseline 1.2. -Continue Lasix for diuresis and monitor renal function closely.  Avoid nephrotoxic agents/hold home losartan and spironolactone at this time.  Hypothyroidism -Continue Synthroid and check TSH level.  OSA (obstructive sleep apnea)- (present on admission) -Continue nightly CPAP  Hypertension- (present on admission) Stable. -Continue Toprol and Lasix.  Hold losartan and spironolactone given AKI.  Insulin dependent type 2 diabetes mellitus (HCC) A1c 8.3 a month ago.  CBG currently in the 200s. -Continue home Levemir -patient takes 20 units twice daily. Order sliding scale insulin sensitive ACHS.  Normal anion gap metabolic acidosis- (present on admission) Likely related to diuretic and Farxiga use. -Hold Farxiga at this time and repeat labs in the morning.   DVT prophylaxis: Lovenox Code Status: Full Code (discussed with the patient) Family Communication: No family available at this time. Consults called: Cardiology Level of care: Telemetry bed Admission status: It is my clinical opinion that admission to INPATIENT is reasonable and necessary because of the expectation that this patient will require hospital care that crosses at least 2 midnights to treat this condition based on the medical complexity of the problems presented.  Given the aforementioned information, the predictability of an adverse outcome is felt to be significant.   Shela Leff, MD [facility] 09/30/2021, 11:17 PM

## 2021-09-30 NOTE — Assessment & Plan Note (Signed)
-  Continue nightly CPAP °

## 2021-09-30 NOTE — ED Provider Triage Note (Signed)
Emergency Medicine Provider Triage Evaluation Note  [name] , a 71 y.o. male  was evaluated in triage.  Pt complains of shortness of breath.  Shortness of breath has been present over the last 2 weeks.  Patient reports that shortness of breath is worse with exertion.  Patient states that he has been unable to sleep well due to being uncomfortable.  When asked about orthopnea patient is unsure.  Patient endorses paroxysmal nocturnal dyspnea.  States that his weight has fluctuated over the last 2 weeks.  Endorses swelling to feet and ankle.  Patient reports that he had chest pain intermittently last night for 30 minutes.  No chest pain since then or at present.  Patient endorses nonproductive cough.  Review of Systems  Positive: Shortness of breath, chest pain, leg swelling, paroxysmal nocturnal dyspnea, nonproductive cough Negative: Fever, chills, hemoptysis, abdominal pain, nausea, vomiting, lightheadedness, syncope  Physical Exam  BP 124/80 (BP Location: Right Arm)    Pulse 90    Resp 18    SpO2 96%  Gen:   Awake, no distress   Resp:  Normal effort, clear to auscultation bilaterally.  Speaks in full complete sentences MSK:   Moves extremities without difficulty; +1 edema to bilateral lower extremities Other:  Abdomen soft, nondistended, nontender  Medical Decision Making  Medically screening exam initiated at 10:14 AM.  Appropriate orders placed.  [name] was informed that the remainder of the evaluation will be completed by another provider, this initial triage assessment does not replace that evaluation, and the importance of remaining in the ED until their evaluation is complete.  Oxygen saturation fluctuates from 86 to 92% on room air.  Patient does not use any home oxygen.  Patient was placed on 2 L of O2 via nasal cannula.  We will make patient [name] 2 and move back to next available room due to new oxygen requirement.   [name], [place] 09/30/21 1017

## 2021-09-30 NOTE — Consult Note (Signed)
Cardiology Consult note:   Patient ID: [name] MRN: [id number]; DOB: Jan 09, 1951   Admission date: 09/30/2021  PCP:  [name], MD   [facility] Providers Cardiologist:  Fransico Him, MD  Electrophysiologist:  Thompson Grayer, MD       Chief Complaint:  Acute on chronic decompensated HF  Patient Profile:   [name] is a 71 y.o. male with CAD s/p Prior CAB, CKD stage IIIa, HfrEF EF 20%, TAA, Prior CVA, AS s/p 29 mm Sapien valve who is being seen 09/30/2021 for the evaluation of SOB.  History of Present Illness:   Patient was doing well at [facility] visit. Has had weight loss, felt well in 2022 summer. Had recent HF admission. Is going through a divorce after 52 years of marriage Doesn't know how to cook, is subsisting on [employer] and processed foods.  Limited fluid intakes. Takes his meds. No CP, no syncope no palpitations. Worsening tachypea.  BNP 1900 SOB, DOE, PND and orthopnea.  Legs and abdomen are swollen. Has given lasix 40 IV x1 and still have NYHA IV symptoms.   Past Medical History:  Diagnosis Date   AICD (automatic cardioverter/defibrillator) present 02/11/2020   Anemia    Arthritis    Atrial tachycardia (HCC)    Chronic systolic CHF (congestive heart failure) ([place]) 07/04/2018   CKD (chronic kidney disease), stage III ([place])    Coronary artery disease 02/2012   a. s/p stenting in 2013  b. s/p CABGx2V (SVG--> PDA, SVG--> LCx).   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  Intolerant to PAP therapy   S/P TAVR (transcatheter aortic valve replacement) 05/15/2018   29 mm Edwards Sapien 3 transcatheter heart valve placed via percutaneous right transfemoral approach    Severe aortic stenosis     Past Surgical History:  Procedure Laterality Date   BIV ICD INSERTION CRT-D N/A 02/11/2020   Procedure: BIV ICD INSERTION CRT-D;  Surgeon: Thompson Grayer, MD;  Location: [place] CV LAB;  Service: Cardiovascular;  Laterality: N/A;   COLONOSCOPY  07/25/2012   Procedure: COLONOSCOPY;  Surgeon: Winfield Cunas., MD;  Location: [facility] ENDOSCOPY;  Service: Endoscopy;  Laterality: N/A;   COLONOSCOPY N/A 12/02/2013   Procedure: COLONOSCOPY;  Surgeon: Winfield Cunas., MD;  Location: [facility] ENDOSCOPY;  Service: Endoscopy;  Laterality: N/A;   CORONARY ARTERY BYPASS GRAFT N/A 01/04/2013   Procedure: CORONARY ARTERY BYPASS GRAFTING (CABG) times two using right saphenous vein harvested with endoscope.;  Surgeon: Ivin Poot, MD;  Location: [place];  Service: Open Heart Surgery;  Laterality: N/A;   ESOPHAGOGASTRODUODENOSCOPY  07/20/2012   Procedure: ESOPHAGOGASTRODUODENOSCOPY (EGD);  Surgeon: Winfield Cunas., MD;  Location: [facility] ENDOSCOPY;  Service: Endoscopy;  Laterality: N/A;   ESOPHAGOGASTRODUODENOSCOPY N/A 12/02/2013   Procedure: ESOPHAGOGASTRODUODENOSCOPY (EGD);  Surgeon: Winfield Cunas., MD;  Location: [facility]  ENDOSCOPY;  Service: Endoscopy;  Laterality: N/A;   HOT HEMOSTASIS N/A 12/02/2013   Procedure: HOT HEMOSTASIS (ARGON PLASMA COAGULATION/BICAP);  Surgeon: Winfield Cunas., MD;  Location: [facility] ENDOSCOPY;  Service: Endoscopy;  Laterality: N/A;   INTRAOPERATIVE TRANSESOPHAGEAL ECHOCARDIOGRAM N/A 01/04/2013   Procedure: INTRAOPERATIVE TRANSESOPHAGEAL ECHOCARDIOGRAM;  Surgeon: Ivin Poot, MD;  Location: [place];  Service: Open Heart Surgery;  Laterality: N/A;   INTRAOPERATIVE TRANSTHORACIC  ECHOCARDIOGRAM N/A 05/15/2018   Procedure: INTRAOPERATIVE TRANSTHORACIC ECHOCARDIOGRAM;  Surgeon: Burnell Blanks, MD;  Location: [place];  Service: Open Heart Surgery;  Laterality: N/A;   IR THORACENTESIS ASP PLEURAL SPACE W/IMG GUIDE  10/19/2018   LEFT HEART CATHETERIZATION WITH CORONARY ANGIOGRAM N/A 03/06/2012   Procedure: LEFT HEART CATHETERIZATION WITH CORONARY ANGIOGRAM;  Surgeon: [name], MD;  Location: [place] CATH LAB;  Service: Cardiovascular;  Laterality: N/A;   LITHOTRIPSY     Groom Left 01/04/2013   Procedure: RADIAL ARTERY HARVEST;  Surgeon: Ivin Poot, MD;  Location: [place];  Service: Vascular;  Laterality: Left;  Artery not havested. Unsuitable for use.   resection mediastinal seminonma     RIGHT/LEFT HEART CATH AND CORONARY/GRAFT ANGIOGRAPHY N/A 11/15/2016   Procedure: Right/Left Heart Cath and Coronary/Graft Angiography;  Surgeon: Leonie Man, MD;  Location: [place] CV LAB;  Service: Cardiovascular;  Laterality: N/A;   RIGHT/LEFT HEART CATH AND CORONARY/GRAFT ANGIOGRAPHY N/A 04/13/2018   Procedure: RIGHT/LEFT HEART CATH AND CORONARY/GRAFT ANGIOGRAPHY;  Surgeon: Jolaine Artist, MD;  Location: [place] CV LAB;  Service: Cardiovascular;  Laterality: N/A;   TRANSCATHETER AORTIC VALVE REPLACEMENT, TRANSFEMORAL  05/15/2018   TRANSCATHETER AORTIC VALVE REPLACEMENT, TRANSFEMORAL N/A 05/15/2018   Procedure: TRANSCATHETER AORTIC VALVE REPLACEMENT, TRANSFEMORAL;  Surgeon: Burnell Blanks, MD;  Location: [place];  Service: Open Heart Surgery;  Laterality: N/A;     Medications Prior to Admission: Prior to Admission medications   Medication Sig Start Date End Date Taking? Authorizing Provider  amitriptyline (ELAVIL) 25 MG tablet Take 25 mg by mouth at bedtime.    Yes [provider]  aspirin EC 81 MG EC tablet Take 1 tablet (81 mg total) by mouth daily. Swallow whole. 09/04/21  Yes Domenic Polite, MD  B Complex-C  (SUPER B COMPLEX PO) Take 1 tablet by mouth daily.   Yes [provider]  dapagliflozin propanediol (FARXIGA) 10 MG TABS tablet Take 1 tablet (10 mg total) by mouth daily. 09/04/21  Yes Domenic Polite, MD  digoxin (LANOXIN) 0.125 MG tablet Take 1 tablet (0.125 mg total) by mouth daily. 09/06/21  Yes Clegg, Amy D, NP  furosemide (LASIX) 20 MG tablet Take 2 tablets (40 mg total) by mouth daily. 09/03/21  Yes Domenic Polite, MD  insulin detemir (LEVEMIR) 100 UNIT/ML injection Inject 10-18 Units into the skin See admin instructions. 18 units in the morning, 10 units at bedtime   Yes [provider]  insulin lispro (HUMALOG) 100 UNIT/ML KwikPen Inject 2-3 Units into the skin See admin instructions. Per sliding scale at Breakfast and Dinner 03/26/19  Yes [provider]  levothyroxine (SYNTHROID, LEVOTHROID) 50 MCG tablet Take 50 mcg by mouth daily before breakfast.    Yes [provider]  losartan (COZAAR) 25 MG tablet TAKE 1 TABLET(25 MG) BY MOUTH DAILY 02/22/21  Yes Turner, Eber Hong, MD  metFORMIN (GLUCOPHAGE) 500 MG tablet Take 2 tablets (1,000 mg total) by mouth 2 (two) times daily with a meal. 05/17/18  Yes Angelena Form  R, PA-C  metoprolol succinate (TOPROL-XL) 100 MG 24 hr tablet Take 1 tablet (100 mg total) by mouth daily. Take with or immediately following a meal. 08/04/21  Yes Turner, Eber Hong, MD  Multiple Vitamin (MULTIVITAMIN) tablet Take 1 tablet by mouth daily.   Yes [provider]  simvastatin (ZOCOR) 20 MG tablet Take 20 mg by mouth every evening.    Yes [provider]  spironolactone (ALDACTONE) 25 MG tablet Take 1/2 tablet (12.5 mg total) by mouth daily. 09/04/21  Yes Domenic Polite, MD  zolpidem (AMBIEN) 5 MG tablet Take 2.5 mg by mouth at bedtime. 12/26/19  Yes [provider]  B-D ULTRAFINE III SHORT PEN 31G X 8 MM MISC 1 each by Other route daily as needed (GLUCOSE TESTING (INSULINE NEEDLE)).  04/22/13   [provider]  Omega-3 Fatty Acids (FISH OIL PO) Take 1 tablet by mouth daily. Patient not taking: Reported on 09/30/2021    [provider]  ONE TOUCH ULTRA TEST test strip 1 each by Other route as needed for other.  01/15/13   [provider]     Allergies:    Allergies  Allergen Reactions   Atorvastatin Other (See Comments)    Muscle pain Other reaction(s): cramps   Entresto [Sacubitril-Valsartan] Other (See Comments)    hypotension   Adhesive [Tape] Rash    Social History:   Social History   Socioeconomic History   Marital status: Married    Spouse name: Not on file   Number of children: Not on file   Years of education: Not on file   Highest education level: Not on file  Occupational History   Occupation: security guard    Comment: [employer]  Tobacco Use   Smoking status: Former    Types: Cigarettes    Quit date: 03/06/1985    Years since quitting: 36.5   Smokeless tobacco: Never  Vaping Use   Vaping Use: Never used  Substance and Sexual Activity   Alcohol use: No   Drug use: No   Sexual activity: Not Currently  Other Topics Concern   Not on file  Social History Narrative   Not on file   Social Determinants of Health   Financial Resource Strain: Low Risk    Difficulty of Paying Living Expenses: Not very hard  Food Insecurity: No Food Insecurity   Worried About Charity fundraiser in the Last Year: Never true   Ran Out of Food in the Last Year: Never true  Transportation Needs: No Transportation Needs   Lack of Transportation (Medical): No   Lack of Transportation (Non-Medical): No  Physical Activity: Not on file  Stress: Not on file  Social Connections: Not on file  Intimate Partner Violence: Not on file    Family History:   The patient's family history includes Cancer in his brother; Diabetes in his father, mother, and sister; Heart failure in his mother; Hypertension in his mother.    ROS:  Please see the history of present illness.  All other  ROS reviewed and negative.     Physical Exam/Data:   Vitals:   09/30/21 1830 09/30/21 1845 09/30/21 2015 09/30/21 2102  BP: (!) 127/91 (!) 120/95 116/84   Pulse: 90 92 85   Resp: (!) 25 (!) 41 (!) 21   Temp:      SpO2: 100% 95% 93%   Weight:    75.8 kg    Intake/Output Summary (Last 24 hours) at 09/30/2021 2108 Last data  filed at 09/30/2021 2015 Gross per 24 hour  Intake --  Output 850 ml  Net -850 ml   Last 3 Weights 09/30/2021 09/06/2021 09/02/2021  Weight (lbs) 167 lb 156 lb 9.6 oz 151 lb 11.2 oz  Weight (kg) 75.751 kg 71.033 kg 68.811 kg     Body mass index is 23.29 kg/m.   Gen: mild distress, thin elderly male   Neck: JVD at mid neck at 45 degrees Cardiac: No Rubs or Gallops, no murmur, RRR +2 pulses Respiratory: Clear to auscultation bilaterally, with tachypnea reate 22 GI: Soft, nontender, distended MS: +2 bilateral edema;  moves all extremities Integument: Skin feels warm Neuro:  At time of evaluation, alert and oriented to person/place/time/situation  Psych: Normal affect, patient feels better  EKG:  The ECG that was done  was personally reviewed and demonstrates SR 92 RBBB and LPBL (BIFB)  Relevant CV Studies: EF 20 % with moderate functional MR  Laboratory Data:  High Sensitivity Troponin:   Recent Labs  Lab 09/30/21 1014 09/30/21 1226  TROPONINIHS 34* 33*      Chemistry Recent Labs  Lab 09/30/21 1014  NA 138  K 3.8  CL 105  CO2 19*  GLUCOSE 211*  BUN 29*  CREATININE 1.66*  CALCIUM 8.9  GFRNONAA 44*  ANIONGAP 14    Recent Labs  Lab 09/30/21 1014  PROT 7.2  ALBUMIN 3.5  AST 26  ALT 27  ALKPHOS 75  BILITOT 0.5   Lipids No results for input(s): CHOL, TRIG, HDL, LABVLDL, LDLCALC, CHOLHDL in the last 168 hours. Hematology Recent Labs  Lab 09/30/21 1014  WBC 4.7  RBC 5.51  HGB 13.1  HCT 41.1  MCV 74.6*  MCH 23.8*  MCHC 31.9  RDW 17.6*  PLT 312   Thyroid No results for input(s): TSH, FREET4 in the last 168 hours. BNP Recent  Labs  Lab 09/30/21 1014  BNP 1,964.7*    DDimer No results for input(s): DDIMER in the last 168 hours.   Radiology/Studies:  DG Chest 2 View  Result Date: 09/30/2021 CLINICAL DATA:  Shortness of breath EXAM: CHEST - 2 VIEW COMPARISON:  Chest x-ray 08/30/2021 FINDINGS: Heart size is normal. Mediastinum appears stable. Cardiac surgical changes and median sternotomy wires. Left-sided cardiac pacemaker device. Pulmonary vasculature is within normal limits. Stable appearing elevated left hemidiaphragm and adjacent opacities in the left lower lung zone which may represent scarring/atelectasis. Stable small right pleural effusion. Right apical pleural thickening. No pneumothorax visualized. IMPRESSION: No change since previous study. Small right pleural effusion, elevated left hemidiaphragm and adjacent likely scarring/atelectatic changes in the left lower lung zone. Electronically Signed   By: Ofilia Neas M.D.   On: 09/30/2021 10:36    A/P Acute on chronic HF CAD with distant CABG DM OSA S/p 29 mm Sapien valve without prosthetic dysfunction - s/p CRT-D. Impedance up. - NYHA IV, starting with 80 IV LASIX BID - Continue Toprol XL 100 mg daily and home digoxin; will get dig level in AM - TSH in AM - Continue losaratn 25 mg daily . - Continue jardiance 10 mg daily  - QRS appears to be widened persistently BiV pacing optimization will be needed prior to DC - ordering nocturnal CPAP - will need additional social support, I worry dietary indiscretion was a component of this decompensation - peri-discharge RHC would be reasonable     For questions or updates, please contact [place] [facility] Please consult [email or web address] for contact info under     Signed, [employer]  Ernst Spell, MD  09/30/2021 9:08 PM

## 2021-09-30 NOTE — Assessment & Plan Note (Addendum)
Likely cardiorenal from acutely decompensated CHF.  Creatinine currently 1.6, baseline 1.2. -Continue Lasix for diuresis and monitor renal function closely.  Avoid nephrotoxic agents/hold home losartan and spironolactone at this time.

## 2021-09-30 NOTE — ED Triage Notes (Signed)
Pt. Stated, Im having SOB and have had it for the last 2 weeks. Denies any other symptoms

## 2021-10-01 ENCOUNTER — Inpatient Hospital Stay: Payer: Self-pay

## 2021-10-01 ENCOUNTER — Encounter (HOSPITAL_COMMUNITY): Payer: Self-pay | Admitting: Internal Medicine

## 2021-10-01 LAB — CBG MONITORING, ED
Glucose-Capillary: 108 mg/dL — ABNORMAL HIGH (ref 70–99)
Glucose-Capillary: 113 mg/dL — ABNORMAL HIGH (ref 70–99)
Glucose-Capillary: 304 mg/dL — ABNORMAL HIGH (ref 70–99)

## 2021-10-01 LAB — BASIC METABOLIC PANEL
Anion gap: 12 (ref 5–15)
BUN: 30 mg/dL — ABNORMAL HIGH (ref 8–23)
CO2: 21 mmol/L — ABNORMAL LOW (ref 22–32)
Calcium: 9.1 mg/dL (ref 8.9–10.3)
Chloride: 105 mmol/L (ref 98–111)
Creatinine, Ser: 1.65 mg/dL — ABNORMAL HIGH (ref 0.61–1.24)
GFR, Estimated: 44 mL/min — ABNORMAL LOW (ref >60)
Glucose, Bld: 206 mg/dL — ABNORMAL HIGH (ref 70–99)
Potassium: 4.1 mmol/L (ref 3.5–5.1)
Sodium: 138 mmol/L (ref 135–145)

## 2021-10-01 LAB — COOXEMETRY PANEL
Carboxyhemoglobin: 1.5 % (ref 0.5–1.5)
Methemoglobin: 1.8 % — ABNORMAL HIGH (ref 0.0–1.5)
O2 Saturation: 61.7 %
Total hemoglobin: 12.9 g/dL (ref 12.0–16.0)

## 2021-10-01 LAB — TSH: TSH: 8.239 u[IU]/mL — ABNORMAL HIGH (ref 0.350–4.500)

## 2021-10-01 LAB — GLUCOSE, CAPILLARY
Glucose-Capillary: 102 mg/dL — ABNORMAL HIGH (ref 70–99)
Glucose-Capillary: 175 mg/dL — ABNORMAL HIGH (ref 70–99)

## 2021-10-01 LAB — DIGOXIN LEVEL: Digoxin Level: <0.2 ng/mL — ABNORMAL LOW (ref 0.8–2.0)

## 2021-10-01 MED ORDER — CHLORHEXIDINE GLUCONATE CLOTH 2 % EX PADS
6.0000 | MEDICATED_PAD | Freq: Every day | CUTANEOUS | Status: DC
  Administered 2021-10-01 – 2021-10-05 (×5): 6 via TOPICAL

## 2021-10-01 MED ORDER — SODIUM CHLORIDE 0.9% FLUSH
10.0000 mL | Freq: Two times a day (BID) | INTRAVENOUS | Status: DC
  Administered 2021-10-01 – 2021-10-05 (×8): 10 mL

## 2021-10-01 MED ORDER — METOPROLOL SUCCINATE ER 50 MG PO TB24
50.0000 mg | ORAL_TABLET | Freq: Every day | ORAL | Status: DC
  Administered 2021-10-02 – 2021-10-05 (×4): 50 mg via ORAL
  Filled 2021-10-01 (×4): qty 1

## 2021-10-01 MED ORDER — INSULIN ASPART 100 UNIT/ML IJ SOLN
0.0000 [IU] | Freq: Three times a day (TID) | INTRAMUSCULAR | Status: DC
  Administered 2021-10-02: 2 [IU] via SUBCUTANEOUS
  Administered 2021-10-02: 3 [IU] via SUBCUTANEOUS
  Administered 2021-10-02: 2 [IU] via SUBCUTANEOUS
  Administered 2021-10-03: 3 [IU] via SUBCUTANEOUS
  Administered 2021-10-03: 2 [IU] via SUBCUTANEOUS
  Administered 2021-10-03: 3 [IU] via SUBCUTANEOUS
  Administered 2021-10-04: 7 [IU] via SUBCUTANEOUS
  Administered 2021-10-04: 2 [IU] via SUBCUTANEOUS
  Administered 2021-10-04: 3 [IU] via SUBCUTANEOUS
  Administered 2021-10-05: 2 [IU] via SUBCUTANEOUS
  Administered 2021-10-05: 3 [IU] via SUBCUTANEOUS

## 2021-10-01 MED ORDER — SPIRONOLACTONE 12.5 MG HALF TABLET
12.5000 mg | ORAL_TABLET | Freq: Every day | ORAL | Status: DC
  Administered 2021-10-01 – 2021-10-02 (×2): 12.5 mg via ORAL
  Filled 2021-10-01 (×2): qty 1

## 2021-10-01 MED ORDER — SODIUM CHLORIDE 0.9% FLUSH: 10.0000 mL | INTRAVENOUS | Status: DC | PRN

## 2021-10-01 MED ORDER — EMPAGLIFLOZIN 10 MG PO TABS
10.0000 mg | ORAL_TABLET | Freq: Every day | ORAL | Status: DC
  Administered 2021-10-01 – 2021-10-05 (×5): 10 mg via ORAL
  Filled 2021-10-01 (×5): qty 1

## 2021-10-01 MED ORDER — ROSUVASTATIN CALCIUM 20 MG PO TABS
20.0000 mg | ORAL_TABLET | Freq: Every evening | ORAL | Status: DC
  Administered 2021-10-01 – 2021-10-04 (×4): 20 mg via ORAL
  Filled 2021-10-01 (×4): qty 1

## 2021-10-01 NOTE — Progress Notes (Signed)
Peripherally Inserted Central Catheter Placement  The IV Nurse has discussed with the patient and/or persons authorized to consent for the patient, the purpose of this procedure and the potential benefits and risks involved with this procedure.  The benefits include less needle sticks, lab draws from the catheter, and the patient may be discharged home with the catheter. Risks include, but not limited to, infection, bleeding, blood clot (thrombus formation), and puncture of an artery; nerve damage and irregular heartbeat and possibility to perform a PICC exchange if needed/ordered by physician.  Alternatives to this procedure were also discussed.  Bard Power PICC patient education guide, fact sheet on infection prevention and patient information card has been provided to patient /or left at bedside.    PICC Placement Documentation  PICC Double Lumen 01/74/94 Right Basilic 38 cm 0 cm (Active)  Indication for Insertion or Continuance of Line Vasoactive infusions 10/01/21 1633  Exposed Catheter (cm) 0 cm 10/01/21 1633  Site Assessment Clean, Dry, Intact 10/01/21 1633  Lumen #1 Status Flushed;Saline locked;Blood return noted 10/01/21 1633  Lumen #2 Status Flushed;Saline locked;Blood return noted 10/01/21 1633  Dressing Type Securing device;Transparent 10/01/21 1633  Dressing Status Antimicrobial disc in place 10/01/21 1633  Dressing Intervention New dressing;Other (Comment) 10/01/21 1633  Dressing Change Due 10/08/21 10/01/21 1633       [name] 10/01/2021, 4:34 PM

## 2021-10-01 NOTE — Progress Notes (Signed)
Patient refused CPAP for the night  

## 2021-10-01 NOTE — Progress Notes (Signed)
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  Was discharged from [facility] on 1/20 after treatment for CHF -Recently had a stress test which was read as high risk, no inducible ischemia, EF 13% -Back in the ED with dyspnea, lower extremity edema and orthopnea  Subjective: -Still reports some dyspnea, reportedly progressively worsening over the past 3 to 4 weeks  Assessment and Plan:  * Acute on chronic systolic CHF (congestive heart failure) ([place])-  -Volume overloaded on exam, BNP elevated, chest x-ray with small right pleural effusion, unchanged -Echo last month with EF 20-25%, severe LV dysfunction -Suspect dietary indiscretion is contributing significantly, he got divorced in November and has been eating all his meals outside for several months -Continue IV Lasix 80 Mg twice daily -Continue Toprol, digoxin,  -Restart Aldactone and Farxiga -Per cardiology, plan to consider BiV pacing optimization for widened QRS and possibly right heart cath  Elevated troponin Chest pain CAD status post CABG Patient reports a few episodes of nonexertional left-sided chest pain last week, most recent episode 3 days ago.  Nuclear medicine stress test done 2 days ago was high risk.  High-sensitivity troponin mildly elevated but stable -Cardiology following, continue aspirin, beta-blocker, and statin.  Acute renal failure superimposed on stage 3a chronic kidney disease ([place])- (present on admission) -Baseline creatinine around 1.5 -Creatinine is relatively stable, losartan held yesterday, will resume tomorrow  if kidney function stays  Hypothyroidism -Continue Synthroid   OSA (obstructive sleep apnea)- (present on admission) -Continue nightly CPAP  Hypertension- (present on admission) Stable. -Meds as above  Insulin dependent type 2 diabetes mellitus (H[facility]) A1c 8.3 a month ago.  CBG currently in the 200s. -Continue home Levemir -patient takes 20 units twice daily. Order sliding scale insulin sensitive ACHS.  DVT prophylaxis: Lovenox Code Status: Full code Family Communication: Discussed with patient in detail, no family at bedside Disposition Plan:   Consultants:  Cardiology  Procedures:   Antimicrobials:    Objective: Vitals:   10/01/21 0742 10/01/21 0745 10/01/21 0800 10/01/21 0925  BP:   (!) 114/99 116/90  Pulse:  69  94  Resp:  16    Temp:      SpO2:  92% 100%   Weight: 70.3 kg     Height: 5\' 10"  (1.778 m)       Intake/Output Summary (Last 24 hours) at 10/01/2021 0959 Last data filed at 10/01/2021 7939 Gross per 24 hour  Intake --  Output 3675 ml  Net -3675 ml   Filed Weights   09/30/21 2102 10/01/21 0742  Weight: 75.8 kg 70.3 kg    Examination:  General exam: Pleasant elderly male laying in bed, AAOx3, no distress HEENT: Positive JVD CVS: S1-S2, regular rate rhythm Lungs: Decreased breath sounds to bases Abdomen: Soft, nontender, bowel sounds present Extremities: 1+ edema Skin: No rashes Psychiatry:  Mood & affect appropriate.     Data Reviewed:   CBC: Recent Labs  Lab 09/30/21 1014  WBC 4.7  NEUTROABS 3.6  HGB 13.1  HCT 41.1  MCV 74.6*  PLT 030   Basic Metabolic Panel: Recent Labs  Lab 09/30/21 1014  10/01/21 0405  NA 138 138  K 3.8 4.1  CL 105 105  CO2 19* 21*  GLUCOSE 211* 206*  BUN 29* 30*  CREATININE 1.66* 1.65*  CALCIUM 8.9 9.1   GFR: Estimated Creatinine Clearance: 40.2 mL/min (A) (by C-G formula based on SCr of 1.65 mg/dL (H)). Liver Function Tests: Recent Labs  Lab 09/30/21 1014  AST 26  ALT 27  ALKPHOS 75   BILITOT 0.5  PROT 7.2  ALBUMIN 3.5   No results for input(s): LIPASE, AMYLASE in the last 168 hours. No results for input(s): AMMONIA in the last 168 hours. Coagulation Profile: No results for input(s): INR, PROTIME in the last 168 hours. Cardiac Enzymes: No results for input(s): CKTOTAL, CKMB, CKMBINDEX, TROPONINI in the last 168 hours. BNP (last 3 results) No results for input(s): PROBNP in the last 8760 hours. HbA1C: No results for input(s): HGBA1C in the last 72 hours. CBG: Recent Labs  Lab 09/30/21 2222 10/01/21 0056 10/01/21 0740  GLUCAP 125* 304* 108*   Lipid Profile: No results for input(s): CHOL, HDL, LDLCALC, TRIG, CHOLHDL, LDLDIRECT in the last 72 hours. Thyroid Function Tests: Recent Labs    10/01/21 0405  TSH 8.239*   Anemia Panel: No results for input(s): VITAMINB12, FOLATE, FERRITIN, TIBC, IRON, RETICCTPCT in the last 72 hours. Urine analysis:    Component Value Date/Time   COLORURINE YELLOW 05/11/2018 1209   APPEARANCEUR HAZY (A) 05/11/2018 1209   LABSPEC 1.011 05/11/2018 1209   PHURINE 5.0 05/11/2018 1209   GLUCOSEU >=500 (A) 05/11/2018 1209   HGBUR LARGE (A) 05/11/2018 1209   BILIRUBINUR NEGATIVE 05/11/2018 1209   KETONESUR NEGATIVE 05/11/2018 1209   PROTEINUR NEGATIVE 05/11/2018 1209   UROBILINOGEN 1.0 01/01/2013 1157   NITRITE NEGATIVE 05/11/2018 1209   LEUKOCYTESUR NEGATIVE 05/11/2018 1209   Sepsis Labs: @LABRCNTIP (procalcitonin:4,lacticidven:4)  ) Recent Results (from the past 240 hour(s))  Resp Panel by RT-PCR (Flu A&B, Covid) Nasopharyngeal Swab     Status: None   Collection Time: 09/30/21 10:11 AM   Specimen: Nasopharyngeal Swab; Nasopharyngeal(NP) swabs in vial transport medium  Result Value Ref Range Status   SARS Coronavirus 2 by RT PCR NEGATIVE NEGATIVE Final    Comment: (NOTE) SARS-CoV-2 target nucleic acids are NOT DETECTED.  The SARS-CoV-2 RNA is generally detectable in upper respiratory specimens during the acute phase  of infection. The lowest concentration of SARS-CoV-2 viral copies this assay can detect is 138 copies/mL. A negative result does not preclude SARS-Cov-2 infection and should not be used as the sole basis for treatment or other patient management decisions. A negative result may occur with  improper specimen collection/handling, submission of specimen other than nasopharyngeal swab, presence of viral mutation(s) within the areas targeted by this assay, and inadequate number of viral copies(<138 copies/mL). A negative result must be combined with clinical observations, patient history, and epidemiological information. The expected result is Negative.  Fact Sheet for Patients:  EntrepreneurPulse.com.au  Fact Sheet for Healthcare Providers:  IncredibleEmployment.be  This test is no t yet approved or cleared by the [place] FDA and  has been authorized for detection and/or diagnosis of SARS-CoV-2 by FDA under an Emergency Use Authorization (EUA). This EUA will remain  in effect (meaning this test can be used) for the duration of the COVID-19 declaration under Section 564(b)(1) of the Act, 21 U.S.C.section 360bbb-3(b)(1), unless the authorization is terminated  or revoked sooner.       Influenza A by PCR NEGATIVE NEGATIVE Final   Influenza B by PCR  NEGATIVE NEGATIVE Final    Comment: (NOTE) The Xpert Xpress SARS-CoV-2/FLU/RSV plus assay is intended as an aid in the diagnosis of influenza from Nasopharyngeal swab specimens and should not be used as a sole basis for treatment. Nasal washings and aspirates are unacceptable for Xpert Xpress SARS-CoV-2/FLU/RSV testing.  Fact Sheet for Patients: EntrepreneurPulse.com.au  Fact Sheet for Healthcare Providers: IncredibleEmployment.be  This test is not yet approved or cleared by the [place] FDA and has been authorized for detection and/or diagnosis of  SARS-CoV-2 by FDA under an Emergency Use Authorization (EUA). This EUA will remain in effect (meaning this test can be used) for the duration of the COVID-19 declaration under Section 564(b)(1) of the Act, 21 U.S.C. section 360bbb-3(b)(1), unless the authorization is terminated or revoked.  Performed at [facility], [place] [address], [place], [place] [address]      Radiology Studies: DG Chest 2 View  Result Date: 09/30/2021 CLINICAL DATA:  Shortness of breath EXAM: CHEST - 2 VIEW COMPARISON:  Chest x-ray 08/30/2021 FINDINGS: Heart size is normal. Mediastinum appears stable. Cardiac surgical changes and median sternotomy wires. Left-sided cardiac pacemaker device. Pulmonary vasculature is within normal limits. Stable appearing elevated left hemidiaphragm and adjacent opacities in the left lower lung zone which may represent scarring/atelectasis. Stable small right pleural effusion. Right apical pleural thickening. No pneumothorax visualized. IMPRESSION: No change since previous study. Small right pleural effusion, elevated left hemidiaphragm and adjacent likely scarring/atelectatic changes in the left lower lung zone. Electronically Signed   By: Ofilia Neas M.D.   On: 09/30/2021 10:36     Scheduled Meds:  amitriptyline  25 mg Oral QHS   aspirin EC  81 mg Oral Daily   digoxin  0.125 mg Oral Daily   empagliflozin  10 mg Oral Daily   enoxaparin (LOVENOX) injection  40 mg Subcutaneous Q24H   furosemide  80 mg Intravenous Q12H   insulin aspart  0-5 Units Subcutaneous QHS   insulin aspart  0-9 Units Subcutaneous TID WC   insulin detemir  20 Units Subcutaneous BID   levothyroxine  50 mcg Oral QAC breakfast   metoprolol succinate  100 mg Oral Daily   simvastatin  20 mg Oral QPM   zolpidem  2.5 mg Oral QHS   Continuous Infusions:   LOS: 1 day    Time spent: [place], MD [facility]   10/01/2021, 9:59 AM

## 2021-10-01 NOTE — Plan of Care (Signed)

## 2021-10-01 NOTE — Consult Note (Addendum)
Advanced Heart Failure Team Consult Note   Primary Physician: [name], MD PCP-Cardiologist:  Fransico Him, MD  Reason for Consultation: acute on chronic systolic CHF  HPI:    [name] is seen today for evaluation of acute on chronic systolic CHF at the request of Dr. Gasper Sells with Cardiology.   71 y.o. male with history of CAD s/p CABG X 2 in 2013, severe AS s/p TAVR 2019, HFrEF, LBBB s/p [employer] CRT-D, HTN, OSA, squamous cell lung cancer LLL 1985, DM.    R/LHC pre TAVR 2019: RA 19, PA 62/21 (42), PCWP 40, CO/CI 3.5/1.78, PA 58%, 61%, total occlusion ostial RCA and Lcx, non-obstructive CAD LAD, patent SVG to OM1, patent SVG to RPDA.  Admitted 08/30/21 with A/C HFrEF. Echo completed EF remains < 20%. Diuresed with IV lasix and transitioned to lasix 40 mg twice a day. Intolerant entresto due to hypotentsion.GDMT with losartan, toprol xl, and spiro. Discharged 09/03/21 with CPAP machine but doesn't know how to use it. Discharge weight 152 pounds.   Seen in [facility] clinic for f/u 01/23. Volume looked okay. Added digoxin 0.125. CPX discussed but wanted to hold off.   Patient reports he began feeling poorly shortly after recent discharge. Notes increasing dyspnea with exertion progressing dyspnea at rest as well as LE edema. On further questioning reports significant functional decline the last 3-6 months. Presented to ED yesterday for further evaluation. Weight up 11 lb from visit in [facility] clinic on 01/23. BNP 1,964 (1,424 in 01/23). HS troponin 34>33. Scr 1.66, CO2 19, Hgb 13.1, MCV low. CXR with small right pleural effusion, elevated left hemidiaphragm with adjacent scarring/atx. He was given 40 mg lasix IV and admitted for a/c CHF.  Cardiology consulted. Diuresing with IV lasix 80 mg IV BID. 3.7L UOP yesterday and 1.3L charted so far today.   Scr 1.52>1.66 (baseline 1.2-1.4)  Lives alone. Undergoing divorce and has been eating out a lot. 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 Reports adherence with medications.  Review of Systems: [y] = yes, [ ]  = no   General: Weight gain [Y]; Weight loss [ ] ; Anorexia [ ] ; Fatigue [Y]; Fever [ ] ; Chills [ ] ; Weakness [Y ]  Cardiac: Chest pain/pressure [ ] ; Resting SOB [Y]; Exertional SOB [Y]; Orthopnea [Y]; Pedal Edema [Y]; Palpitations [ ] ; Syncope [ ] ; Presyncope [ ] ; Paroxysmal nocturnal dyspnea[Y]  Pulmonary: Cough [ ] ; Wheezing[ ] ; Hemoptysis[ ] ; Sputum [ ] ; Snoring [ ]   GI: Vomiting[ ] ; Dysphagia[ ] ; Melena[ ] ; Hematochezia [ ] ; Heartburn[ ] ; Abdominal pain [ ] ; Constipation [ ] ; Diarrhea [ ] ; BRBPR [ ]   GU: Hematuria[ ] ; Dysuria [ ] ; Nocturia[ ]   Vascular: Pain in legs with walking [ ] ; Pain in feet with lying flat [ ] ; Non-healing sores [ ] ; Stroke [ ] ; TIA [ ] ; Slurred speech [ ] ;  Neuro: Headaches[ ] ; Vertigo[ ] ; Seizures[ ] ; Paresthesias[ ] ;Blurred vision [ ] ; Diplopia [ ] ; Vision changes [ ]   Ortho/Skin: Arthritis [ ] ; Joint pain [ ] ; Muscle pain [ ] ; Joint swelling [ ] ; Back Pain [ ] ; Rash [ ]   Psych: Depression[ ] ; Anxiety[ ]   Heme: Bleeding problems [ ] ; Clotting disorders [ ] ; Anemia [ ]   Endocrine: Diabetes [Y]; Thyroid dysfunction[Y]  Home Medications Prior to Admission medications   Medication Sig Start Date End Date Taking? Authorizing Provider  amitriptyline (ELAVIL) 25 MG tablet Take 25 mg by mouth at bedtime.    Yes [provider]  aspirin  EC 81 MG EC tablet Take 1 tablet (81 mg total) by mouth daily. Swallow whole. 09/04/21  Yes Domenic Polite, MD  B Complex-C (SUPER B COMPLEX PO) Take 1 tablet by mouth daily.   Yes [provider]  dapagliflozin propanediol (FARXIGA) 10 MG TABS tablet Take 1 tablet (10 mg total) by mouth daily. 09/04/21  Yes Domenic Polite, MD  digoxin (LANOXIN) 0.125 MG tablet Take 1 tablet (0.125 mg total) by mouth daily. 09/06/21  Yes Clegg, Amy D, NP  furosemide (LASIX) 20 MG tablet Take 2 tablets (40 mg total) by mouth daily. 09/03/21  Yes Domenic Polite,  MD  insulin detemir (LEVEMIR) 100 UNIT/ML injection Inject 10-18 Units into the skin See admin instructions. 18 units in the morning, 10 units at bedtime   Yes [provider]  insulin lispro (HUMALOG) 100 UNIT/ML KwikPen Inject 2-3 Units into the skin See admin instructions. Per sliding scale at Breakfast and Dinner 03/26/19  Yes [provider]  levothyroxine (SYNTHROID, LEVOTHROID) 50 MCG tablet Take 50 mcg by mouth daily before breakfast.    Yes [provider]  losartan (COZAAR) 25 MG tablet TAKE 1 TABLET(25 MG) BY MOUTH DAILY 02/22/21  Yes Turner, Eber Hong, MD  metFORMIN (GLUCOPHAGE) 500 MG tablet Take 2 tablets (1,000 mg total) by mouth 2 (two) times daily with a meal. 05/17/18  Yes Eileen Stanford, PA-C  metoprolol succinate (TOPROL-XL) 100 MG 24 hr tablet Take 1 tablet (100 mg total) by mouth daily. Take with or immediately following a meal. 08/04/21  Yes Turner, Eber Hong, MD  Multiple Vitamin (MULTIVITAMIN) tablet Take 1 tablet by mouth daily.   Yes [provider]  simvastatin (ZOCOR) 20 MG tablet Take 20 mg by mouth every evening.    Yes [provider]  spironolactone (ALDACTONE) 25 MG tablet Take 1/2 tablet (12.5 mg total) by mouth daily. 09/04/21  Yes Domenic Polite, MD  zolpidem (AMBIEN) 5 MG tablet Take 2.5 mg by mouth at bedtime. 12/26/19  Yes [provider]  B-D ULTRAFINE III SHORT PEN 31G X 8 MM MISC 1 each by Other route daily as needed (GLUCOSE TESTING (INSULINE NEEDLE)).  04/22/13   [provider]  Omega-3 Fatty Acids (FISH OIL PO) Take 1 tablet by mouth daily. Patient not taking: Reported on 09/30/2021    [provider]  ONE TOUCH ULTRA TEST test strip 1 each by Other route as needed for other.  01/15/13   [provider]    Past Medical History: Past Medical History:  Diagnosis Date   AICD (automatic cardioverter/defibrillator) present 02/11/2020   Anemia    Arthritis    Atrial tachycardia  (HCC)    Chronic systolic CHF (congestive heart failure) ([place]) 07/04/2018   CKD (chronic kidney disease), stage III ([place])    Coronary artery disease 02/2012   a. s/p stenting in 2013  b. s/p CABGx2V (SVG--> PDA, SVG--> LCx).   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  Intolerant to PAP therapy   S/P TAVR (transcatheter aortic valve replacement) 05/15/2018   29 mm Edwards Sapien 3 transcatheter heart valve placed via percutaneous right transfemoral approach    Severe aortic stenosis     Past Surgical History: Past Surgical History:  Procedure Laterality Date   BIV ICD INSERTION CRT-D N/A 02/11/2020   Procedure: BIV ICD INSERTION CRT-D;  Surgeon: Thompson Grayer, MD;  Location: [place] [facility];  Service: Cardiovascular;  Laterality: N/A;   COLONOSCOPY  07/25/2012   Procedure: COLONOSCOPY;  Surgeon: Winfield Cunas., MD;  Location: [facility] ENDOSCOPY;  Service: Endoscopy;  Laterality: N/A;   COLONOSCOPY N/A 12/02/2013   Procedure: COLONOSCOPY;  Surgeon: Winfield Cunas., MD;  Location: [facility] ENDOSCOPY;  Service: Endoscopy;  Laterality: N/A;   CORONARY ARTERY BYPASS GRAFT N/A 01/04/2013   Procedure: CORONARY ARTERY BYPASS GRAFTING (CABG) times two using right saphenous vein harvested with endoscope.;  Surgeon: Ivin Poot, MD;  Location: [place];  Service: Open Heart Surgery;  Laterality: N/A;   ESOPHAGOGASTRODUODENOSCOPY  07/20/2012    Procedure: ESOPHAGOGASTRODUODENOSCOPY (EGD);  Surgeon: Winfield Cunas., MD;  Location: [facility] ENDOSCOPY;  Service: Endoscopy;  Laterality: N/A;   ESOPHAGOGASTRODUODENOSCOPY N/A 12/02/2013   Procedure: ESOPHAGOGASTRODUODENOSCOPY (EGD);  Surgeon: Winfield Cunas., MD;  Location: [facility];  Service: Endoscopy;  Laterality: N/A;   HOT HEMOSTASIS N/A 12/02/2013   Procedure: HOT HEMOSTASIS (ARGON PLASMA COAGULATION/BICAP);  Surgeon: Winfield Cunas., MD;  Location: [facility]OSCOPY;  Service: Endoscopy;  Laterality: N/A;   INTRAOPERATIVE TRANSESOPHAGEAL ECHOCARDIOGRAM N/A 01/04/2013   Procedure: INTRAOPERATIVE TRANSESOPHAGEAL ECHOCARDIOGRAM;  Surgeon: Ivin Poot, MD;  Location: [place];  Service: Open Heart Surgery;  Laterality: N/A;   INTRAOPERATIVE TRANSTHORACIC ECHOCARDIOGRAM N/A 05/15/2018   Procedure: INTRAOPERATIVE TRANSTHORACIC ECHOCARDIOGRAM;  Surgeon: Burnell Blanks, MD;  Location: [place];  Service: Open Heart Surgery;  Laterality: N/A;   IR THORACENTESIS ASP PLEURAL SPACE W/IMG GUIDE  10/19/2018   LEFT HEART CATHETERIZATION WITH CORONARY ANGIOGRAM N/A 03/06/2012   Procedure: LEFT HEART CATHETERIZATION WITH CORONARY ANGIOGRAM;  Surgeon: [name], MD;  Location: [place] CATH LAB;  Service: Cardiovascular;  Laterality: N/A;   LITHOTRIPSY     [place] Left 01/04/2013   Procedure: RADIAL ARTERY HARVEST;  Surgeon: Ivin Poot, MD;  Location: [place];  Service: Vascular;  Laterality: Left;  Artery not havested. Unsuitable for use.   resection mediastinal seminonma     RIGHT/LEFT HEART CATH AND CORONARY/GRAFT ANGIOGRAPHY N/A 11/15/2016   Procedure: Right/Left Heart Cath and Coronary/Graft Angiography;  Surgeon: Leonie Man, MD;  Location: [place] [facility];  Service: Cardiovascular;  Laterality: N/A;   RIGHT/LEFT HEART CATH AND CORONARY/GRAFT ANGIOGRAPHY N/A 04/13/2018   Procedure: RIGHT/LEFT HEART CATH AND CORONARY/GRAFT ANGIOGRAPHY;  Surgeon:  Jolaine Artist, MD;  Location: [place] [facility];  Service: Cardiovascular;  Laterality: N/A;   TRANSCATHETER AORTIC VALVE REPLACEMENT, TRANSFEMORAL  05/15/2018   TRANSCATHETER AORTIC VALVE REPLACEMENT, TRANSFEMORAL N/A 05/15/2018   Procedure: TRANSCATHETER AORTIC VALVE REPLACEMENT, TRANSFEMORAL;  Surgeon: Burnell Blanks, MD;  Location: [place];  Service: Open Heart Surgery;  Laterality: N/A;    Family History: Family History  Problem Relation Age of Onset   Diabetes Father    Diabetes Mother    Heart failure Mother    Hypertension Mother    Cancer Brother  Diabetes Sister     Social History: Social History   Socioeconomic History   Marital status: Married    Spouse name: Not on file   Number of children: Not on file   Years of education: Not on file   Highest education level: Not on file  Occupational History   Occupation: security guard    Comment: [employer]  Tobacco Use   Smoking status: Former    Types: Cigarettes    Quit date: 03/06/1985    Years since quitting: 36.5   Smokeless tobacco: Never  Vaping Use   Vaping Use: Never used  Substance and Sexual Activity   Alcohol use: No   Drug use: No   Sexual activity: Not Currently  Other Topics Concern   Not on file  Social History Narrative   Not on file   Social Determinants of Health   Financial Resource Strain: Low Risk    Difficulty of Paying Living Expenses: Not very hard  Food Insecurity: No Food Insecurity   Worried About Charity fundraiser in the Last Year: Never true   Ran Out of Food in the Last Year: Never true  Transportation Needs: No Transportation Needs   Lack of Transportation (Medical): No   Lack of Transportation (Non-Medical): No  Physical Activity: Not on file  Stress: Not on file  Social Connections: Not on file    Allergies:  Allergies  Allergen Reactions   Atorvastatin Other (See Comments)    Muscle pain Other reaction(s): cramps   Entresto [Sacubitril-Valsartan]  Other (See Comments)    hypotension   Adhesive [Tape] Rash    Objective:    Vital Signs:   Temp:  [97.7 F (36.5 C)-98.4 F (36.9 C)] 98.3 F (36.8 C) (02/17 1237) Pulse Rate:  [40-99] 88 (02/17 1237) Resp:  [11-41] 14 (02/17 1237) BP: (97-127)/(66-99) 106/66 (02/17 1237) SpO2:  [78 %-100 %] 93 % (02/17 1237) Weight:  [70.3 kg-75.8 kg] 70.3 kg (02/17 0742)    Weight change: Filed Weights   09/30/21 2102 10/01/21 0742  Weight: 75.8 kg 70.3 kg    Intake/Output:   Intake/Output Summary (Last 24 hours) at 10/01/2021 1301 Last data filed at 10/01/2021 1128 Gross per 24 hour  Intake --  Output 5025 ml  Net -5025 ml      Physical Exam    General:  Thin, chronically ill appearing HEENT: normal Neck: supple. JVP to ear. Carotids 2+ bilat; no bruits.  Cor: PMI nondisplaced. Regular rate & rhythm. No rubs, gallops or murmurs. Lungs: clear Abdomen: soft, nontender, nondistended. No hepatosplenomegaly.  Extremities: no cyanosis, clubbing, rash, 2+ edema, extremities are cool Neuro: alert & orientedx3, cranial nerves grossly intact. moves all 4 extremities w/o difficulty. Affect pleasant   Telemetry   SR 80s-90s  EKG    SR 92 bpm, RBBB, LPFB, QRS 150 ms  Labs   Basic Metabolic Panel: Recent Labs  Lab 09/30/21 1014 10/01/21 0405  NA 138 138  K 3.8 4.1  CL 105 105  CO2 19* 21*  GLUCOSE 211* 206*  BUN 29* 30*  CREATININE 1.66* 1.65*  CALCIUM 8.9 9.1    Liver Function Tests: Recent Labs  Lab 09/30/21 1014  AST 26  ALT 27  ALKPHOS 75  BILITOT 0.5  PROT 7.2  ALBUMIN 3.5   No results for input(s): LIPASE, AMYLASE in the last 168 hours. No results for input(s): AMMONIA in the last 168 hours.  CBC: Recent Labs  Lab 09/30/21 1014  WBC 4.7  NEUTROABS 3.6  HGB 13.1  HCT 41.1  MCV 74.6*  PLT 312    Cardiac Enzymes: No results for input(s): CKTOTAL, CKMB, CKMBINDEX, TROPONINI in the last 168 hours.  BNP: BNP (last 3 results) Recent Labs     08/30/21 0832 08/30/21 1215 09/30/21 1014  BNP 1,931.7* 1,424.1* 1,964.7*    ProBNP (last 3 results) No results for input(s): PROBNP in the last 8760 hours.   CBG: Recent Labs  Lab 09/30/21 2222 10/01/21 0056 10/01/21 0740 10/01/21 1132  GLUCAP 125* 304* 108* 113*    Coagulation Studies: No results for input(s): LABPROT, INR in the last 72 hours.   Imaging   No results found.   Medications:     Current Medications:  amitriptyline  25 mg Oral QHS   aspirin EC  81 mg Oral Daily   digoxin  0.125 mg Oral Daily   empagliflozin  10 mg Oral Daily   enoxaparin (LOVENOX) injection  40 mg Subcutaneous Q24H   furosemide  80 mg Intravenous Q12H   insulin aspart  0-5 Units Subcutaneous QHS   insulin aspart  0-9 Units Subcutaneous TID WC   insulin detemir  20 Units Subcutaneous BID   levothyroxine  50 mcg Oral QAC breakfast   metoprolol succinate  100 mg Oral Daily   simvastatin  20 mg Oral QPM   spironolactone  12.5 mg Oral Daily   zolpidem  2.5 mg Oral QHS    Infusions:     Patient Profile   71 y.o. male with history of chronic systolic CHF, LBBB w/ hx CRT-D placement (unable to place LV lead), CAD s/p CABG, severe AS s/p TAVR, DM II, OSA. Admitted with a/c CHF.  Assessment/Plan   Chronic  systolic CHF. - s/p CRT-D 01/2020. Unable to place LV lead.  -Had PYP 2020 not suggestive of amyloid.  -Echo 08/30/21: EF 20-25%, RV significantly reduced, RVSP 50 mmHg, moderate MR, moderate TR, mean gradient 2 mmHg across aortic valve prosthesis - NYHA IV. Appears significantly volume overloaded. Continue IV lasix 80 mg BID - Concerned about low-output HF. Place PICC line and follow CVP and Co-ox. If co-ox low, will add milrinone for inotrope support. - Decrease Toprol to 50 mg daily with concern for low-output. May need to discontinue. - Continue digoxin 0.125, dig level < 0.2 - Failed entresto in the past due to hypotension.  - Continue jardiance 10 mg daily  -  Continue spiro 12.5 mg daily - Corlanor previously stopped d/t cost - Worry that he may be nearing end-stage. This was discussed with patient and his son at bedside. Depending on progress, will potentially see if he is a candidate for advanced therapies (ie LVAD).   2. CAD -H/O CABG 2013 -Mild HS troponin elevation on admit with flat trend. Likely d/t CHF. - No chest pain.  - On asa 81 mg daily - On simvastatin. Switch to rosuvastatin.   3. DMII -Insulin dependent -Hgb A1C 8.3 in 01/23 -On jardiance   4. OSA - started on CPAP during his recent hospitalization   5. Aortic valve stenosis - S/P TAVR 2019 with 29 mm S3 -Valve okay on recent echo   7. Hypothyroid -On levothyroxine.  -TSH 8.2. Check Free T4.  8. Microcytic anemia -Hgb 12-13 -MCV low -Check iron stores   Length of Stay: 1  FINCH, LINDSAY N, PA-C  10/01/2021, 1:01 PM  Advanced Heart Failure Team Pager [phone] (M-F; 7a - 5p)  Please contact [facility] for night-coverage after hours (4p -  7a ) and weekends on [email or web address]   Patient seen and examined with the above-signed Advanced Practice Provider and/or Housestaff. I personally reviewed laboratory data, imaging studies and relevant notes. I independently examined the patient and formulated the important aspects of the plan. I have edited the note to reflect any of my changes or salient points. I have personally discussed the plan with the patient and/or family.  71 y/o male as above with CAD s/p CABG, AS s/p TAVR, LBBB s/p ICD (unable to place LV lead), DM2 admitted with ADHF.   Echo EF 10% RV moderate to severe HK   Over past 3 weeks NYHA IIIB-IV  On exam frail and weak. Volume overloaded. Extremities cool.   General:  Weak appearing. Cachetic No resp difficulty HEENT: normal Neck: supple. JVP to jaw Carotids 2+ bilat; no bruits. No lymphadenopathy or thryomegaly appreciated. Cor: PMI nondisplaced. Regular rate & rhythm. 2/6 SEM at RUSB Lungs:  clear Abdomen: soft, nontender, nondistended. No hepatosplenomegaly. No bruits or masses. Good bowel sounds. Extremities: no cyanosis, clubbing, rash, 2+ edema cool Neuro: alert & orientedx3, cranial nerves grossly intact. moves all 4 extremities w/o difficulty. Affect pleasant  Suspect low-output end-stage biventricular CM. Likely not candidate for advanced therapies given fraility but wouldn't completely exclude however he says he probably would not be interested in a VAD.   Will place PICC. Check co-ox & CVP. Continue IV diuresis. May need milrinone.   Discussed at length with patient and his son.    Glori Bickers, MD  10:57 PM

## 2021-10-01 NOTE — Progress Notes (Signed)
Heart Failure Nurse Navigator Progress Note  AHF rounding team consulted/following this admission.  Kevan Rosebush, RN, BSN, [facility] Heart Failure Navigator Heart & Vascular Care Navigation Team

## 2021-10-01 NOTE — ED Notes (Signed)
Per diet order, ice water given to pt per request for heartburn

## 2021-10-01 NOTE — ED Notes (Signed)
Pt at the bedside eating breakfast

## 2021-10-01 NOTE — TOC Initial Note (Signed)
Transition of Care [facility]) - Initial/Assessment Note    Patient Details  Name: [name] MRN: [id number] Date of Birth: 1951/02/21  [facility]) CM/SW Contact:    Erenest Rasher, RN Phone Number: [phone] 10/01/2021, 4:51 PM  Clinical Narrative:                 HF TOC CM spoke to pt and states he lives alone. His sons will assist him as needed. He still works full-time and independent prior to hospital stay. He can afford medications but has new meds and would like to know copay. Will send for benefits check for [place]. Will continue to follow for dc needs.   Expected Discharge Plan: Home/Self Care Barriers to Discharge: Continued Medical Work up   Patient Goals and CMS Choice Patient states their goals for this hospitalization and ongoing recovery are:: wants to get back to his independence CMS Medicare.gov Compare Post Acute Care list provided to:: Patient    Expected Discharge Plan and Services Expected Discharge Plan: Home/Self Care   Discharge Planning Services: CM Consult   Living arrangements for the past 2 months: Single Family Home                                      Prior Living Arrangements/Services Living arrangements for the past 2 months: Single Family Home Lives with:: Self Patient language and need for interpreter reviewed:: Yes        Need for Family Participation in Patient Care: No (Comment) Care giver support system in place?: No (comment) Current home services: DME (CPAP) Criminal Activity/Legal Involvement Pertinent to Current Situation/Hospitalization: No - Comment as needed  Activities of Daily Living Home Assistive Devices/Equipment: None ADL Screening (condition at time of admission) Is the patient deaf or have difficulty hearing?: No Does the patient have difficulty seeing, even when wearing glasses/contacts?: No Does the patient have difficulty concentrating, remembering, or making decisions?:  No Does the patient have difficulty dressing or bathing?: No Does the patient have difficulty walking or climbing stairs?: Yes  Permission Sought/Granted Permission sought to share information with : Case Manager, Family Supports, PCP Permission granted to share information with : Yes, Verbal Permission Granted  Share Information with NAME: [name]     Permission granted to share info w Relationship: son  Permission granted to share info w Contact Information: [phone]  Emotional Assessment   Attitude/Demeanor/Rapport: Engaged Affect (typically observed): Accepting Orientation: : Oriented to Self, Oriented to Place, Oriented to  Time, Oriented to Situation      Admission diagnosis:  CHF exacerbation ([place]) [S56.8] Acute systolic congestive heart failure (HCC) [I50.21] Acute on chronic systolic (congestive) heart failure ([place]) [I50.23] Patient Active Problem List   Diagnosis Date Noted   Hypothyroidism 09/30/2021   Acute renal failure superimposed on stage 3a chronic kidney disease ([place]) 09/30/2021   Elevated troponin 09/30/2021   Normal anion gap metabolic acidosis 12/75/1700   Acute on chronic systolic (congestive) heart failure ([place]) 09/30/2021   Acute on chronic systolic CHF (congestive heart failure) ([place]) 08/30/2021   Ischemic cardiomyopathy 02/11/2020   Left bundle branch block 01/08/2020   Squamous cell carcinoma of bronchus in left lower lobe ([place]) 11/22/2018   Nodule of lower lobe of left lung 10/27/2018   OSA (obstructive sleep apnea) 17/49/4496   Chronic systolic CHF (congestive heart failure) ([place]) 07/04/2018   S/P TAVR (  transcatheter aortic valve replacement) 05/15/2018   History of CVA (cerebrovascular accident)    Cardiomyopathy ([place]) 11/11/2016   Aortic stenosis, severe 06/27/2014   AAA (abdominal aortic aneurysm)    Insomnia    Hypertension    SOB (shortness of breath)    Insulin dependent type 2 diabetes mellitus ([place]) 12/28/2012   Dyslipidemia  12/28/2012   CAD (coronary artery disease), native coronary artery 02/13/2012   Hx of radiation therapy to mediastinum 08/16/1983   Malignant seminoma of mediastinum ([place]) 08/16/1983   PCP:  [name], MD Pharmacy:   [id number] [address], [place] - [address] [place] [address] Phone: [phone] Fax: [phone]  [facility] [address] [place] [address] Phone: [phone] Fax: [phone]     Social Determinants of Health (SDOH) Interventions    Readmission Risk Interventions No flowsheet data found.

## 2021-10-02 LAB — CBC
HCT: 36.4 % — ABNORMAL LOW (ref 39.0–52.0)
Hemoglobin: 12.4 g/dL — ABNORMAL LOW (ref 13.0–17.0)
MCH: 24.1 pg — ABNORMAL LOW (ref 26.0–34.0)
MCHC: 34.1 g/dL (ref 30.0–36.0)
MCV: 70.8 fL — ABNORMAL LOW (ref 80.0–100.0)
Platelets: 252 10*3/uL (ref 150–400)
RBC: 5.14 MIL/uL (ref 4.22–5.81)
RDW: 16.7 % — ABNORMAL HIGH (ref 11.5–15.5)
WBC: 5.7 10*3/uL (ref 4.0–10.5)
nRBC: 0.5 % — ABNORMAL HIGH (ref 0.0–0.2)

## 2021-10-02 LAB — BASIC METABOLIC PANEL
Anion gap: 10 (ref 5–15)
BUN: 28 mg/dL — ABNORMAL HIGH (ref 8–23)
CO2: 26 mmol/L (ref 22–32)
Calcium: 8.4 mg/dL — ABNORMAL LOW (ref 8.9–10.3)
Chloride: 104 mmol/L (ref 98–111)
Creatinine, Ser: 1.41 mg/dL — ABNORMAL HIGH (ref 0.61–1.24)
GFR, Estimated: 53 mL/min — ABNORMAL LOW (ref >60)
Glucose, Bld: 125 mg/dL — ABNORMAL HIGH (ref 70–99)
Potassium: 3 mmol/L — ABNORMAL LOW (ref 3.5–5.1)
Sodium: 140 mmol/L (ref 135–145)

## 2021-10-02 LAB — IRON AND TIBC
Iron: 21 ug/dL — ABNORMAL LOW (ref 45–182)
Saturation Ratios: 6 % — ABNORMAL LOW (ref 17.9–39.5)
TIBC: 353 ug/dL (ref 250–450)
UIBC: 332 ug/dL

## 2021-10-02 LAB — T4, FREE: Free T4: 1.23 ng/dL — ABNORMAL HIGH (ref 0.61–1.12)

## 2021-10-02 LAB — COOXEMETRY PANEL
Carboxyhemoglobin: 1.6 % — ABNORMAL HIGH (ref 0.5–1.5)
Methemoglobin: 0.7 % (ref 0.0–1.5)
O2 Saturation: 70.2 %
Total hemoglobin: 12.5 g/dL (ref 12.0–16.0)

## 2021-10-02 LAB — GLUCOSE, CAPILLARY
Glucose-Capillary: 50 mg/dL — ABNORMAL LOW (ref 70–99)
Glucose-Capillary: 89 mg/dL (ref 70–99)
Glucose-Capillary: 185 mg/dL — ABNORMAL HIGH (ref 70–99)
Glucose-Capillary: 152 mg/dL — ABNORMAL HIGH (ref 70–99)
Glucose-Capillary: 214 mg/dL — ABNORMAL HIGH (ref 70–99)
Glucose-Capillary: 341 mg/dL — ABNORMAL HIGH (ref 70–99)

## 2021-10-02 LAB — FERRITIN: Ferritin: 15 ng/mL — ABNORMAL LOW (ref 24–336)

## 2021-10-02 MED ORDER — SPIRONOLACTONE 25 MG PO TABS
25.0000 mg | ORAL_TABLET | Freq: Every day | ORAL | Status: DC
  Administered 2021-10-03 – 2021-10-05 (×3): 25 mg via ORAL
  Filled 2021-10-02 (×3): qty 1

## 2021-10-02 MED ORDER — POTASSIUM CHLORIDE CRYS ER 20 MEQ PO TBCR
40.0000 meq | EXTENDED_RELEASE_TABLET | Freq: Two times a day (BID) | ORAL | Status: AC
  Administered 2021-10-02 – 2021-10-03 (×3): 40 meq via ORAL
  Filled 2021-10-02 (×3): qty 2

## 2021-10-02 MED ORDER — GLUCOSE 40 % PO GEL
2.0000 | ORAL | Status: AC
  Administered 2021-10-02: 62 g via ORAL
  Filled 2021-10-02: qty 2

## 2021-10-02 MED ORDER — INSULIN DETEMIR 100 UNIT/ML ~~LOC~~ SOLN
6.0000 [IU] | Freq: Two times a day (BID) | SUBCUTANEOUS | Status: DC
  Administered 2021-10-02 – 2021-10-03 (×4): 6 [IU] via SUBCUTANEOUS
  Filled 2021-10-02 (×6): qty 0.06

## 2021-10-02 MED ORDER — SODIUM CHLORIDE 0.9 % IV SOLN
510.0000 mg | Freq: Once | INTRAVENOUS | Status: AC
  Administered 2021-10-02: 510 mg via INTRAVENOUS
  Filled 2021-10-02: qty 17

## 2021-10-02 NOTE — Significant Event (Addendum)
Hypoglycemic Event  CBG: 50  Treatment: 2 tubes glucose gel... pt also requested a sandwich, regular soda, and applesauce  Symptoms: Shaky and weak  Follow-up CBG: Time: 04:17 CBG Result: 89  Possible Reasons for Event: Medication regimen: Levemir insulin  Comments/MD notified: Yes    [name] J

## 2021-10-02 NOTE — Plan of Care (Signed)
  Problem: Education: Goal: Ability to demonstrate management of disease process will improve Outcome: Progressing Goal: Ability to verbalize understanding of medication therapies will improve Outcome: Progressing   

## 2021-10-02 NOTE — Progress Notes (Signed)
Pt refuses CPAP for the night. RT will continue to monitor as needed.

## 2021-10-02 NOTE — Progress Notes (Signed)
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  Was discharged from [facility] on 1/20 after treatment for CHF -Recently had a stress test which was read as high risk, no inducible ischemia, EF 13% -Back in the ED with dyspnea, lower extremity edema and orthopnea  Subjective: -Feels better, breathing considerably improved  Assessment and Plan:  * Acute on chronic systolic CHF (congestive heart failure) ([name])-  -Volume overloaded on exam, BNP elevated, chest x-ray with small right pleural effusion, unchanged -Echo last month with EF 20-25%, severe LV dysfunction -Suspect dietary indiscretion is contributing significantly, he got divorced in November and has been eating all his meals outside for several months -Continue IV Lasix 80 Mg twice daily, he is 5.9 L negative -Continue Toprol, digoxin,  -Continue Aldactone, Farxiga -Heart failure team following, BMP in a.m. -Increase activity  Elevated troponin Chest pain CAD status post CABG Patient reports a few episodes of nonexertional left-sided chest pain last week, most recent episode 3 days ago.  Nuclear medicine stress test done 2 days ago was high risk.  High-sensitivity troponin mildly elevated but stable -Cardiology following, continue aspirin, beta-blocker, and statin.  Acute renal failure superimposed on stage 3a chronic kidney disease ([name])- (present on admission) -Baseline creatinine around 1.5 -Creatinine is relatively stable, losartan on hold  Hypothyroidism -Continue Synthroid   OSA (obstructive sleep apnea)- (present on  admission) -Continue nightly CPAP  Hypertension- (present on admission) Stable. -Meds as above  Insulin dependent type 2 diabetes mellitus (HCC) A1c 8.3 a month ago.   -With hypoglycemic episode, will decrease Levemir to 6 units twice daily  Iron deficiency -But not anemic, hemoglobin is 12.4 -Cards added IV iron today  DVT prophylaxis: Lovenox Code Status: Full code Family Communication: Discussed with patient in detail, no family at bedside Disposition Plan:   Consultants:  Cardiology  Procedures:   Antimicrobials:    Objective: Vitals:   10/02/21 0220 10/02/21 0540 10/02/21 0745 10/02/21 1140  BP:  103/61 117/70 102/67  Pulse:  90 90 89  Resp:  18 15 18   Temp:  (!) 97.5 F (36.4 C) 97.9 F (36.6 C) 98 F (36.7 C)  TempSrc:  Oral Oral Oral  SpO2: 97% 98% 97% 100%  Weight:  68.5 kg    Height:        Intake/Output Summary (Last 24 hours) at 10/02/2021 1322 Last data filed at 10/02/2021 1241 Gross per 24 hour  Intake 1572 ml  Output 3925 ml  Net -2353 ml   Filed Weights   09/30/21 2102 10/01/21 0742 10/02/21 0540  Weight: 75.8 kg 70.3 kg 68.5 kg    Examination:  General exam: Pleasant elderly male lying in bed, AAOx3, no distress HEENT: Positive JVD CVS: S1-S2, regular rate rhythm Lungs: Decreased breath sounds at the bases Abdomen: Soft, nontender, bowel sounds present Extremities: Trace edema  Skin: No rashes Psychiatry:  Mood & affect appropriate.     Data Reviewed:   CBC: Recent Labs  Lab 09/30/21 1014 10/02/21 0450  WBC 4.7 5.7  NEUTROABS 3.6  --   HGB 13.1 12.4*  HCT 41.1 36.4*  MCV 74.6*  70.8*  PLT 312 132   Basic Metabolic Panel: Recent Labs  Lab 09/30/21 1014 10/01/21 0405 10/02/21 0450  NA 138 138 140  K 3.8 4.1 3.0*  CL 105 105 104  CO2 19* 21* 26  GLUCOSE 211* 206* 125*  BUN 29* 30* 28*  CREATININE 1.66* 1.65* 1.41*  CALCIUM 8.9 9.1 8.4*   GFR: Estimated Creatinine Clearance: 45.9 mL/min (A) (by C-G formula  based on SCr of 1.41 mg/dL (H)). Liver Function Tests: Recent Labs  Lab 09/30/21 1014  AST 26  ALT 27  ALKPHOS 75  BILITOT 0.5  PROT 7.2  ALBUMIN 3.5   No results for input(s): LIPASE, AMYLASE in the last 168 hours. No results for input(s): AMMONIA in the last 168 hours. Coagulation Profile: No results for input(s): INR, PROTIME in the last 168 hours. Cardiac Enzymes: No results for input(s): CKTOTAL, CKMB, CKMBINDEX, TROPONINI in the last 168 hours. BNP (last 3 results) No results for input(s): PROBNP in the last 8760 hours. HbA1C: No results for input(s): HGBA1C in the last 72 hours. CBG: Recent Labs  Lab 10/01/21 2055 10/02/21 0343 10/02/21 0417 10/02/21 0624 10/02/21 1139  GLUCAP 175* 50* 89 185* 152*   Lipid Profile: No results for input(s): CHOL, HDL, LDLCALC, TRIG, CHOLHDL, LDLDIRECT in the last 72 hours. Thyroid Function Tests: Recent Labs    10/01/21 0405 10/02/21 0450  TSH 8.239*  --   FREET4  --  1.23*   Anemia Panel: Recent Labs    10/02/21 0450  FERRITIN 15*  TIBC 353  IRON 21*   Urine analysis:    Component Value Date/Time   COLORURINE YELLOW 05/11/2018 1209   APPEARANCEUR HAZY (A) 05/11/2018 1209   LABSPEC 1.011 05/11/2018 1209   PHURINE 5.0 05/11/2018 1209   GLUCOSEU >=500 (A) 05/11/2018 1209   HGBUR LARGE (A) 05/11/2018 1209   BILIRUBINUR NEGATIVE 05/11/2018 1209   KETONESUR NEGATIVE 05/11/2018 1209   PROTEINUR NEGATIVE 05/11/2018 1209   UROBILINOGEN 1.0 01/01/2013 1157   NITRITE NEGATIVE 05/11/2018 1209   LEUKOCYTESUR NEGATIVE 05/11/2018 1209   Sepsis Labs: @LABRCNTIP (procalcitonin:4,lacticidven:4)  ) Recent Results (from the past 240 hour(s))  Resp Panel by RT-PCR (Flu A&B, Covid) Nasopharyngeal Swab     Status: None   Collection Time: 09/30/21 10:11 AM   Specimen: Nasopharyngeal Swab; Nasopharyngeal(NP) swabs in vial transport medium  Result Value Ref Range Status   SARS Coronavirus 2 by RT PCR NEGATIVE NEGATIVE Final     Comment: (NOTE) SARS-CoV-2 target nucleic acids are NOT DETECTED.  The SARS-CoV-2 RNA is generally detectable in upper respiratory specimens during the acute phase of infection. The lowest concentration of SARS-CoV-2 viral copies this assay can detect is 138 copies/mL. A negative result does not preclude SARS-Cov-2 infection and should not be used as the sole basis for treatment or other patient management decisions. A negative result may occur with  improper specimen collection/handling, submission of specimen other than nasopharyngeal swab, presence of viral mutation(s) within the areas targeted by this assay, and inadequate number of viral copies(<138 copies/mL). A negative result must be combined with clinical observations, patient history, and epidemiological information. The expected result is Negative.  Fact Sheet for Patients:  EntrepreneurPulse.com.au  Fact Sheet for Healthcare Providers:  IncredibleEmployment.be  This test is no t yet approved or cleared by the [place] FDA and  has been authorized for detection and/or diagnosis of SARS-CoV-2 by FDA under an Emergency Use Authorization (EUA). This EUA will remain  in effect (meaning this test can  be used) for the duration of the COVID-19 declaration under Section 564(b)(1) of the Act, 21 U.S.C.section 360bbb-3(b)(1), unless the authorization is terminated  or revoked sooner.       Influenza A by PCR NEGATIVE NEGATIVE Final   Influenza B by PCR NEGATIVE NEGATIVE Final    Comment: (NOTE) The Xpert Xpress SARS-CoV-2/FLU/RSV plus assay is intended as an aid in the diagnosis of influenza from Nasopharyngeal swab specimens and should not be used as a sole basis for treatment. Nasal washings and aspirates are unacceptable for Xpert Xpress SARS-CoV-2/FLU/RSV testing.  Fact Sheet for Patients: EntrepreneurPulse.com.au  Fact Sheet for Healthcare  Providers: IncredibleEmployment.be  This test is not yet approved or cleared by the [place] FDA and has been authorized for detection and/or diagnosis of SARS-CoV-2 by FDA under an Emergency Use Authorization (EUA). This EUA will remain in effect (meaning this test can be used) for the duration of the COVID-19 declaration under Section 564(b)(1) of the Act, 21 U.S.C. section 360bbb-3(b)(1), unless the authorization is terminated or revoked.  Performed at [facility], [place] [address], [place], [place] [address]      Radiology Studies: [place] EKG SITE RITE  Result Date: 10/01/2021 If [facility] image not attached, placement could not be confirmed due to current cardiac rhythm.    Scheduled Meds:  amitriptyline  25 mg Oral QHS   aspirin EC  81 mg Oral Daily   Chlorhexidine Gluconate Cloth  6 each Topical Daily   digoxin  0.125 mg Oral Daily   empagliflozin  10 mg Oral Daily   enoxaparin (LOVENOX) injection  40 mg Subcutaneous Q24H   furosemide  80 mg Intravenous Q12H   insulin aspart  0-5 Units Subcutaneous QHS   insulin aspart  0-9 Units Subcutaneous TID WC   insulin detemir  6 Units Subcutaneous BID   levothyroxine  50 mcg Oral QAC breakfast   metoprolol succinate  50 mg Oral Daily   potassium chloride  40 mEq Oral BID   rosuvastatin  20 mg Oral QPM   sodium chloride flush  10-40 mL Intracatheter Q12H   [START ON 10/03/2021] spironolactone  25 mg Oral Daily   zolpidem  2.5 mg Oral QHS   Continuous Infusions:   LOS: 2 days    Time spent: 41min    Domenic Polite, MD [facility]   10/02/2021, 1:22 PM

## 2021-10-02 NOTE — Progress Notes (Signed)
Inpatient Diabetes Program Recommendations  AACE/ADA: New Consensus Statement on Inpatient Glycemic Control (2015)  Target Ranges:  Prepandial:   less than 140 mg/dL      Peak postprandial:   less than 180 mg/dL (1-2 hours)      Critically ill patients:  140 - 180 mg/dL   Lab Results  Component Value Date   GLUCAP 185 (H) 10/02/2021   HGBA1C 8.3 (H) 08/31/2021    Review of Glycemic Control  Latest Reference Range & Units 10/02/21 03:43 10/02/21 04:17 10/02/21 06:24  Glucose-Capillary 70 - 99 mg/dL 50 (L) 89 185 (H)  (L): Data is abnormally low (H): Data is abnormally high Diabetes history: Type 2 DM Current orders for Inpatient glycemic control: Novolog 0-9 units TID & HS,  Jardiance 10 mg QD, Levemir 6 units BID Inpatient Diabetes Program Recommendations:    Noted AM hypoglycemia and subsequent insulin adjustments. In agreement. Following.   Thanks, Bronson Curb, MSN, RNC-OB Diabetes Coordinator [phone] (8a-5p)

## 2021-10-02 NOTE — Progress Notes (Signed)
Patient ID: [name], male   DOB: [birth year], 71 y.o.   MRN: [id number]     Advanced Heart Failure Rounding Note  PCP-Cardiologist: Fransico Him, MD   Subjective:    Breathing better today.  Good diuresis, weight down 4 lbs.   Co-ox 70%, CVP 14.  SBP 90s-100s.    Objective:   Weight Range: 68.5 kg Body mass index is 21.68 kg/m.   Vital Signs:   Temp:  [97.4 F (36.3 C)-98.3 F (36.8 C)] 97.9 F (36.6 C) (02/18 0745) Pulse Rate:  [88-90] 90 (02/18 0745) Resp:  [11-18] 15 (02/18 0745) BP: (94-125)/(52-84) 117/70 (02/18 0745) SpO2:  [88 %-100 %] 97 % (02/18 0745) Weight:  [68.5 kg] 68.5 kg (02/18 0540) Last BM Date : 09/30/21  Weight change: Filed Weights   09/30/21 2102 10/01/21 0742 10/02/21 0540  Weight: 75.8 kg 70.3 kg 68.5 kg    Intake/Output:   Intake/Output Summary (Last 24 hours) at 10/02/2021 1019 Last data filed at 10/02/2021 2423 Gross per 24 hour  Intake 1452 ml  Output 3575 ml  Net -2123 ml      Physical Exam    General:  Well appearing. No resp difficulty HEENT: Normal Neck: Supple. JVP 10-12. Carotids 2+ bilat; no bruits. No lymphadenopathy or thyromegaly appreciated. Cor: PMI nondisplaced. Regular rate & rhythm. 1/6 SEM RUSB Lungs: Decreased at bases.  Abdomen: Soft, nontender, nondistended. No hepatosplenomegaly. No bruits or masses. Good bowel sounds. Extremities: No cyanosis, clubbing, rash, edema Neuro: Alert & orientedx3, cranial nerves grossly intact. moves all 4 extremities w/o difficulty. Affect pleasant   Telemetry   NSR, RBBB (personally reviewed)   Labs    CBC Recent Labs    09/30/21 1014 10/02/21 0450  WBC 4.7 5.7  NEUTROABS 3.6  --   HGB 13.1 12.4*  HCT 41.1 36.4*  MCV 74.6* 70.8*  PLT 312 536   Basic Metabolic Panel Recent Labs    10/01/21 0405 10/02/21 0450  NA 138 140  K 4.1 3.0*  CL 105 104  CO2 21* 26  GLUCOSE 206* 125*  BUN 30* 28*  CREATININE 1.65* 1.41*  CALCIUM 9.1 8.4*   Liver Function  Tests Recent Labs    09/30/21 1014  AST 26  ALT 27  ALKPHOS 75  BILITOT 0.5  PROT 7.2  ALBUMIN 3.5   No results for input(s): LIPASE, AMYLASE in the last 72 hours. Cardiac Enzymes No results for input(s): CKTOTAL, CKMB, CKMBINDEX, TROPONINI in the last 72 hours.  BNP: BNP (last 3 results) Recent Labs    08/30/21 0832 08/30/21 1215 09/30/21 1014  BNP 1,931.7* 1,424.1* 1,964.7*    ProBNP (last 3 results) No results for input(s): PROBNP in the last 8760 hours.   D-Dimer No results for input(s): DDIMER in the last 72 hours. Hemoglobin A1C No results for input(s): HGBA1C in the last 72 hours. Fasting Lipid Panel No results for input(s): CHOL, HDL, LDLCALC, TRIG, CHOLHDL, LDLDIRECT in the last 72 hours. Thyroid Function Tests Recent Labs    10/01/21 0405  TSH 8.239*    Other results:   Imaging    [place] EKG SITE RITE  Result Date: 10/01/2021 If Site Rite image not attached, placement could not be confirmed due to current cardiac rhythm.    Medications:     Scheduled Medications:  amitriptyline  25 mg Oral QHS   aspirin EC  81 mg Oral Daily   Chlorhexidine Gluconate Cloth  6 each Topical Daily   digoxin  0.125 mg  Oral Daily   empagliflozin  10 mg Oral Daily   enoxaparin (LOVENOX) injection  40 mg Subcutaneous Q24H   furosemide  80 mg Intravenous Q12H   insulin aspart  0-5 Units Subcutaneous QHS   insulin aspart  0-9 Units Subcutaneous TID WC   insulin detemir  6 Units Subcutaneous BID   levothyroxine  50 mcg Oral QAC breakfast   metoprolol succinate  50 mg Oral Daily   potassium chloride  40 mEq Oral BID   rosuvastatin  20 mg Oral QPM   sodium chloride flush  10-40 mL Intracatheter Q12H   spironolactone  12.5 mg Oral Daily   zolpidem  2.5 mg Oral QHS    Infusions:   PRN Medications: acetaminophen **OR** acetaminophen, sodium chloride flush    Assessment/Plan   Chronic  systolic CHF. - s/p St Jude CRT-D 01/2020. Unable to place LV lead.  Interestingly, he has a RBBB this admission and had LBBB on prior ECGs.  Discussed with Dr. Lovena Le, with current RBBB a left bundle lead will not be likely to help.  - Had PYP 2020 not suggestive of amyloid.  - Echo 08/30/21: EF 20-25%, RV significantly reduced, RVSP 50 mmHg, moderate MR, moderate TR, mean gradient 2 mmHg across aortic valve prosthesis - NYHA IV with volume overload at admission. PICC placed, CVP 14 this morning with co-ox 70%.  He is diuresing well.  Does not need milrinone at this time.  Continue Lasix 80 mg IV bid today.  - Decreased Toprol to 50 mg daily - Continue digoxin 0.125, dig level < 0.2 - Failed entresto in the past due to hypotension.  - Continue jardiance 10 mg daily  - Increase spironolactone to 25 mg daily - Corlanor previously stopped d/t cost - Looks frail and had suspected low output but co-ox 62% => 70%.    2. CAD -H/O CABG 2013 -Mild HS troponin elevation on admit with flat trend. Likely d/t CHF. - No chest pain.  - On asa 81 mg daily - On rosuvastatin.   3. DMII -Insulin dependent -Hgb A1C 8.3 in 01/23 -On jardiance   4. OSA - started on CPAP during his recent hospitalization   5. Aortic valve stenosis - S/P TAVR 2019 with 29 mm S3 - Valve okay on recent echo   7. Hypothyroid -On levothyroxine.    8. Microcytic anemia -Hgb 12-13 -MCV low - Low transferrin saturation, will give feraheme.   Length of Stay: 2  Loralie Champagne, MD  10/02/2021, 10:19 AM  Advanced Heart Failure Team Pager [phone] (M-F; 7a - 5p)  Please contact [facility] for night-coverage after hours (5p -7a ) and weekends on [email or web address]

## 2021-10-03 LAB — CBC
HCT: 38.7 % — ABNORMAL LOW (ref 39.0–52.0)
Hemoglobin: 13 g/dL (ref 13.0–17.0)
MCH: 24.1 pg — ABNORMAL LOW (ref 26.0–34.0)
MCHC: 33.6 g/dL (ref 30.0–36.0)
MCV: 71.8 fL — ABNORMAL LOW (ref 80.0–100.0)
Platelets: 278 10*3/uL (ref 150–400)
RBC: 5.39 MIL/uL (ref 4.22–5.81)
RDW: 16.8 % — ABNORMAL HIGH (ref 11.5–15.5)
WBC: 4.1 10*3/uL (ref 4.0–10.5)
nRBC: 0.5 % — ABNORMAL HIGH (ref 0.0–0.2)

## 2021-10-03 LAB — COOXEMETRY PANEL
Carboxyhemoglobin: 1.3 % (ref 0.5–1.5)
Methemoglobin: <0.7 % (ref 0.0–1.5)
O2 Saturation: 58 %
Total hemoglobin: 13.4 g/dL (ref 12.0–16.0)

## 2021-10-03 LAB — BASIC METABOLIC PANEL
Anion gap: 10 (ref 5–15)
BUN: 25 mg/dL — ABNORMAL HIGH (ref 8–23)
CO2: 28 mmol/L (ref 22–32)
Calcium: 8.5 mg/dL — ABNORMAL LOW (ref 8.9–10.3)
Chloride: 103 mmol/L (ref 98–111)
Creatinine, Ser: 1.35 mg/dL — ABNORMAL HIGH (ref 0.61–1.24)
GFR, Estimated: 56 mL/min — ABNORMAL LOW (ref >60)
Glucose, Bld: 118 mg/dL — ABNORMAL HIGH (ref 70–99)
Potassium: 3.7 mmol/L (ref 3.5–5.1)
Sodium: 141 mmol/L (ref 135–145)

## 2021-10-03 LAB — GLUCOSE, CAPILLARY
Glucose-Capillary: 209 mg/dL — ABNORMAL HIGH (ref 70–99)
Glucose-Capillary: 154 mg/dL — ABNORMAL HIGH (ref 70–99)
Glucose-Capillary: 239 mg/dL — ABNORMAL HIGH (ref 70–99)
Glucose-Capillary: 400 mg/dL — ABNORMAL HIGH (ref 70–99)

## 2021-10-03 MED ORDER — FUROSEMIDE 10 MG/ML IJ SOLN
80.0000 mg | Freq: Two times a day (BID) | INTRAMUSCULAR | Status: AC
  Administered 2021-10-03: 80 mg via INTRAVENOUS
  Filled 2021-10-03: qty 8

## 2021-10-03 NOTE — Progress Notes (Signed)
Patient ID: [name], male   DOB: Jan 26, 1951, 71 y.o.   MRN: [id number]     Advanced Heart Failure Rounding Note  PCP-Cardiologist: Fransico Him, MD   Subjective:    Feels much better.  Good diuresis again, weight down 7 lbs.   Co-ox 58%, CVP 8-9.  SBP 100s-110s.    Objective:   Weight Range: 65.5 kg Body mass index is 20.72 kg/m.   Vital Signs:   Temp:  [97.5 F (36.4 C)-98 F (36.7 C)] 97.6 F (36.4 C) (02/19 0851) Pulse Rate:  [89-94] 94 (02/19 0331) Resp:  [17-18] 18 (02/19 0851) BP: (102-112)/(65-81) 109/74 (02/19 0851) SpO2:  [96 %-100 %] 96 % (02/19 0851) Weight:  [65.5 kg] 65.5 kg (02/19 0331) Last BM Date : 09/30/21  Weight change: Filed Weights   10/01/21 0742 10/02/21 0540 10/03/21 0331  Weight: 70.3 kg 68.5 kg 65.5 kg    Intake/Output:   Intake/Output Summary (Last 24 hours) at 10/03/2021 1030 Last data filed at 10/03/2021 1000 Gross per 24 hour  Intake 1740 ml  Output 6350 ml  Net -4610 ml      Physical Exam    General: NAD Neck: JVP 8 cm, no thyromegaly or thyroid nodule.  Lungs: Clear to auscultation bilaterally with normal respiratory effort. CV: Nondisplaced PMI.  Heart regular S1/S2, no S3/S4, 1/6 SEM RUSB.  1+ ankle edema.  Abdomen: Soft, nontender, no hepatosplenomegaly, no distention.  Skin: Intact without lesions or rashes.  Neurologic: Alert and oriented x 3.  Psych: Normal affect. Extremities: No clubbing or cyanosis.  HEENT: Normal.    Telemetry   NSR, 80s (personally reviewed)   Labs    CBC Recent Labs    10/02/21 0450 10/03/21 0407  WBC 5.7 4.1  HGB 12.4* 13.0  HCT 36.4* 38.7*  MCV 70.8* 71.8*  PLT 252 093   Basic Metabolic Panel Recent Labs    10/02/21 0450 10/03/21 0407  NA 140 141  K 3.0* 3.7  CL 104 103  CO2 26 28  GLUCOSE 125* 118*  BUN 28* 25*  CREATININE 1.41* 1.35*  CALCIUM 8.4* 8.5*   Liver Function Tests No results for input(s): AST, ALT, ALKPHOS, BILITOT, PROT, ALBUMIN in the last  72 hours.  No results for input(s): LIPASE, AMYLASE in the last 72 hours. Cardiac Enzymes No results for input(s): CKTOTAL, CKMB, CKMBINDEX, TROPONINI in the last 72 hours.  BNP: BNP (last 3 results) Recent Labs    08/30/21 0832 08/30/21 1215 09/30/21 1014  BNP 1,931.7* 1,424.1* 1,964.7*    ProBNP (last 3 results) No results for input(s): PROBNP in the last 8760 hours.   D-Dimer No results for input(s): DDIMER in the last 72 hours. Hemoglobin A1C No results for input(s): HGBA1C in the last 72 hours. Fasting Lipid Panel No results for input(s): CHOL, HDL, LDLCALC, TRIG, CHOLHDL, LDLDIRECT in the last 72 hours. Thyroid Function Tests Recent Labs    10/01/21 0405  TSH 8.239*    Other results:   Imaging    No results found.   Medications:     Scheduled Medications:  amitriptyline  25 mg Oral QHS   aspirin EC  81 mg Oral Daily   Chlorhexidine Gluconate Cloth  6 each Topical Daily   digoxin  0.125 mg Oral Daily   empagliflozin  10 mg Oral Daily   enoxaparin (LOVENOX) injection  40 mg Subcutaneous Q24H   furosemide  80 mg Intravenous Q12H   insulin aspart  0-5 Units Subcutaneous QHS  insulin aspart  0-9 Units Subcutaneous TID WC   insulin detemir  6 Units Subcutaneous BID   levothyroxine  50 mcg Oral QAC breakfast   metoprolol succinate  50 mg Oral Daily   rosuvastatin  20 mg Oral QPM   sodium chloride flush  10-40 mL Intracatheter Q12H   spironolactone  25 mg Oral Daily   zolpidem  2.5 mg Oral QHS    Infusions:   PRN Medications: acetaminophen **OR** acetaminophen, sodium chloride flush    Assessment/Plan   Chronic  systolic CHF. - s/p St Jude CRT-D 01/2020. Unable to place LV lead. Interestingly, he has a RBBB this admission and had LBBB on prior ECGs.  Discussed with Dr. Lovena Le, with current RBBB a left bundle lead will not be likely to help.  - Had PYP 2020 not suggestive of amyloid.  - Echo 08/30/21: EF 20-25%, RV significantly reduced, RVSP  50 mmHg, moderate MR, moderate TR, mean gradient 2 mmHg across aortic valve prosthesis - NYHA IV with volume overload at admission. PICC placed, CVP 8-9 this morning with co-ox 58%.  He is diuresing well and feels much better.  Hold off on milrinone.  Continue Lasix 80 mg IV bid today, stop after this evening and transition to po diuretic tomorrow.  - Decreased Toprol to 50 mg daily - Continue digoxin 0.125, dig level < 0.2 - Failed entresto in the past due to hypotension.  - Continue jardiance 10 mg daily  - Continue spironolactone 25 mg daily - Corlanor previously stopped d/t cost - Looks frail and had suspected low output but co-ox has been ok.    2. CAD -H/O CABG 2013 -Mild HS troponin elevation on admit with flat trend. Likely d/t CHF. - No chest pain.  - On asa 81 mg daily - On rosuvastatin.   3. DMII -Insulin dependent -Hgb A1C 8.3 in 01/23 -On jardiance   4. OSA - started on CPAP during his recent hospitalization   5. Aortic valve stenosis - S/P TAVR 2019 with 29 mm S3 - Valve okay on recent echo   7. Hypothyroid -On levothyroxine.    8. Microcytic anemia -Hgb 12-13 -MCV low - Low transferrin saturation, given feraheme.   Should be ready for discharge tomorrow.   Length of Stay: 3  Loralie Champagne, MD  10/03/2021, 10:30 AM  Advanced Heart Failure Team Pager [phone] (M-F; 7a - 5p)  Please contact [facility] for night-coverage after hours (5p -7a ) and weekends on [email or web address]

## 2021-10-03 NOTE — Progress Notes (Signed)
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  Was discharged from [facility] on 1/20 after treatment for CHF -Recently had a stress test which was read as high risk, no inducible ischemia, EF 13% -Back in the ED with dyspnea, lower extremity edema and orthopnea -Improving on high-dose diuretics  Subjective: -Feels much better overall, breathing close to baseline  Assessment and Plan:  * Acute on chronic systolic CHF (congestive heart failure) (HCC)-  -Volume overloaded on exam, BNP elevated, chest x-ray with small right pleural effusion, unchanged -Echo last month with EF 20-25%, severe LV dysfunction -Suspect dietary indiscretion is contributing significantly, he got divorced in November and has been eating all his meals outside for several months -Diuresed with IV Lasix 80 Mg twice daily, he is 10 L negative -Continue Toprol, digoxin, Aldactone Farxiga, failed Entresto in the past -To be transitioned to oral Lasix -Heart failure team following -Discharge planning, home tomorrow, BMP in a.m. Will need close follow-up  Elevated troponin Chest pain CAD status post CABG Patient reports a few episodes of nonexertional left-sided chest pain last week, most recent episode 3 days ago.  Nuclear medicine stress test done 2 days ago was high risk.  High-sensitivity troponin mildly elevated but stable -Cardiology following, continue aspirin, beta-blocker, and statin.  Acute renal failure superimposed on stage 3a chronic kidney disease ([place])- (present on admission) -Baseline creatinine around  1.5 -Creatinine is relatively stable, losartan on hold  Hypothyroidism -Continue Synthroid   OSA (obstructive sleep apnea)- (present on admission) -Continue nightly CPAP  Hypertension- (present on admission) Stable. -Meds as above  Insulin dependent type 2 diabetes mellitus (HCC) A1c 8.3 a month ago.   -With hypoglycemic episode the other day, CBG stable on Levemir 60 units, continue [place]  Iron deficiency -But not anemic, hemoglobin is 12.4 -Given IV iron yesterday, would benefit from gastroenterology eval/screening colonoscopy however may not be appropriate for sedation in the setting of severe cardiomyopathy  DVT prophylaxis: Lovenox Code Status: Full code Family Communication: Discussed with patient in detail, no family at bedside Disposition Plan: Home tomorrow if stable  Consultants:  Cardiology  Procedures:   Antimicrobials:    Objective: Vitals:   10/03/21 0333 10/03/21 0334 10/03/21 0851 10/03/21 1140  BP:   109/74 138/75  Pulse:    (!) 102  Resp:   18 18  Temp:   97.6 F (36.4 C) 97.9 F (36.6 C)  TempSrc:   Oral Oral  SpO2: 98% 97% 96% 98%  Weight:      Height:        Intake/Output Summary (Last 24 hours) at 10/03/2021 1213 Last data filed at 10/03/2021 1000 Gross per 24 hour  Intake 1740 ml  Output 5050 ml  Net -3310 ml   Filed Weights   10/01/21 0742 10/02/21 0540 10/03/21 0331  Weight: 70.3 kg 68.5 kg 65.5 kg    Examination:  General exam: Pleasant elderly male laying in bed, AAOx3, no distress HEENT: No JVD CVS: S1-S2, regular rate rhythm Lungs: Improved air movement, lungs are clear Abdomen: Soft, nontender, bowel sounds present Extremities: No edema Skin: No rashes Psychiatry:  Mood & affect appropriate.     Data Reviewed:   CBC: Recent Labs  Lab 09/30/21 1014 10/02/21 0450 10/03/21 0407  WBC 4.7 5.7 4.1  NEUTROABS 3.6  --   --   HGB 13.1 12.4* 13.0  HCT 41.1 36.4* 38.7*  MCV 74.6* 70.8* 71.8*  PLT 312 252 824    Basic Metabolic Panel: Recent Labs  Lab 09/30/21 1014 10/01/21 0405 10/02/21 0450 10/03/21 0407  NA 138 138 140 141  K 3.8 4.1 3.0* 3.7  CL 105 105 104 103  CO2 19* 21* 26 28  GLUCOSE 211* 206* 125* 118*  BUN 29* 30* 28* 25*  CREATININE 1.66* 1.65* 1.41* 1.35*  CALCIUM 8.9 9.1 8.4* 8.5*   GFR: Estimated Creatinine Clearance: 45.8 mL/min (A) (by C-G formula based on SCr of 1.35 mg/dL (H)). Liver Function Tests: Recent Labs  Lab 09/30/21 1014  AST 26  ALT 27  ALKPHOS 75  BILITOT 0.5  PROT 7.2  ALBUMIN 3.5   No results for input(s): LIPASE, AMYLASE in the last 168 hours. No results for input(s): AMMONIA in the last 168 hours. Coagulation Profile: No results for input(s): INR, PROTIME in the last 168 hours. Cardiac Enzymes: No results for input(s): CKTOTAL, CKMB, CKMBINDEX, TROPONINI in the last 168 hours. BNP (last 3 results) No results for input(s): PROBNP in the last 8760 hours. HbA1C: No results for input(s): HGBA1C in the last 72 hours. CBG: Recent Labs  Lab 10/02/21 1139 10/02/21 1611 10/02/21 2055 10/03/21 0624 10/03/21 1137  GLUCAP 152* 214* 341* 209* 154*   Lipid Profile: No results for input(s): CHOL, HDL, LDLCALC, TRIG, CHOLHDL, LDLDIRECT in the last 72 hours. Thyroid Function Tests: Recent Labs    10/01/21 0405 10/02/21 0450  TSH 8.239*  --   FREET4  --  1.23*   Anemia Panel: Recent Labs    10/02/21 0450  FERRITIN 15*  TIBC 353  IRON 21*   Urine analysis:    Component Value Date/Time   COLORURINE YELLOW 05/11/2018 1209   APPEARANCEUR HAZY (A) 05/11/2018 1209   LABSPEC 1.011 05/11/2018 1209   PHURINE 5.0 05/11/2018 1209   GLUCOSEU >=500 (A) 05/11/2018 1209   HGBUR LARGE (A) 05/11/2018 1209   BILIRUBINUR NEGATIVE 05/11/2018 1209   KETONESUR NEGATIVE 05/11/2018 1209   PROTEINUR NEGATIVE 05/11/2018 1209   UROBILINOGEN 1.0 01/01/2013 1157   NITRITE NEGATIVE 05/11/2018 1209   LEUKOCYTESUR NEGATIVE 05/11/2018 1209   Sepsis  Labs: @LABRCNTIP (procalcitonin:4,lacticidven:4)  ) Recent Results (from the past 240 hour(s))  Resp Panel by RT-PCR (Flu A&B, Covid) Nasopharyngeal Swab     Status: None   Collection Time: 09/30/21 10:11 AM   Specimen: Nasopharyngeal Swab; Nasopharyngeal(NP) swabs in vial transport medium  Result Value Ref Range Status   SARS Coronavirus 2 by RT PCR NEGATIVE NEGATIVE Final    Comment: (NOTE) SARS-CoV-2 target nucleic acids are NOT DETECTED.  The SARS-CoV-2 RNA is generally detectable in upper respiratory specimens during the acute phase of infection. The lowest concentration of SARS-CoV-2 viral copies this assay can detect is 138 copies/mL. A negative result does not preclude SARS-Cov-2 infection and should not be used as the sole basis for treatment or other patient management decisions. A negative result may occur with  improper specimen collection/handling, submission of specimen other than nasopharyngeal swab, presence of viral mutation(s) within the areas targeted by this assay, and inadequate number of viral copies(<138 copies/mL). A negative result must be combined with clinical observations, patient history, and epidemiological information. The expected result  is Negative.  Fact Sheet for Patients:  EntrepreneurPulse.com.au  Fact Sheet for Healthcare Providers:  IncredibleEmployment.be  This test is no t yet approved or cleared by the [place] FDA and  has been authorized for detection and/or diagnosis of SARS-CoV-2 by FDA under an Emergency Use Authorization (EUA). This EUA will remain  in effect (meaning this test can be used) for the duration of the COVID-19 declaration under Section 564(b)(1) of the Act, 21 U.S.C.section 360bbb-3(b)(1), unless the authorization is terminated  or revoked sooner.       Influenza A by PCR NEGATIVE NEGATIVE Final   Influenza B by PCR NEGATIVE NEGATIVE Final    Comment: (NOTE) The Xpert  Xpress SARS-CoV-2/FLU/RSV plus assay is intended as an aid in the diagnosis of influenza from Nasopharyngeal swab specimens and should not be used as a sole basis for treatment. Nasal washings and aspirates are unacceptable for Xpert Xpress SARS-CoV-2/FLU/RSV testing.  Fact Sheet for Patients: EntrepreneurPulse.com.au  Fact Sheet for Healthcare Providers: IncredibleEmployment.be  This test is not yet approved or cleared by the [place] FDA and has been authorized for detection and/or diagnosis of SARS-CoV-2 by FDA under an Emergency Use Authorization (EUA). This EUA will remain in effect (meaning this test can be used) for the duration of the COVID-19 declaration under Section 564(b)(1) of the Act, 21 U.S.C. section 360bbb-3(b)(1), unless the authorization is terminated or revoked.  Performed at [facility], [place] [address] [place], [place] [address]      Radiology Studies: [place] EKG SITE RITE  Result Date: 10/01/2021 If [facility] image not attached, placement could not be confirmed due to current cardiac rhythm.    Scheduled Meds:  amitriptyline  25 mg Oral QHS   aspirin EC  81 mg Oral Daily   Chlorhexidine Gluconate Cloth  6 each Topical Daily   digoxin  0.125 mg Oral Daily   empagliflozin  10 mg Oral Daily   enoxaparin (LOVENOX) injection  40 mg Subcutaneous Q24H   furosemide  80 mg Intravenous Q12H   insulin aspart  0-5 Units Subcutaneous QHS   insulin aspart  0-9 Units Subcutaneous TID WC   insulin detemir  6 Units Subcutaneous BID   levothyroxine  50 mcg Oral QAC breakfast   metoprolol succinate  50 mg Oral Daily   rosuvastatin  20 mg Oral QPM   sodium chloride flush  10-40 mL Intracatheter Q12H   spironolactone  25 mg Oral Daily   zolpidem  2.5 mg Oral QHS   Continuous Infusions:   LOS: 3 days    Time spent: 41min  Domenic Polite, MD [facility]   10/03/2021, 12:13 PM

## 2021-10-04 ENCOUNTER — Other Ambulatory Visit (HOSPITAL_COMMUNITY): Payer: Self-pay

## 2021-10-04 LAB — GLUCOSE, CAPILLARY
Glucose-Capillary: 229 mg/dL — ABNORMAL HIGH (ref 70–99)
Glucose-Capillary: 195 mg/dL — ABNORMAL HIGH (ref 70–99)
Glucose-Capillary: 331 mg/dL — ABNORMAL HIGH (ref 70–99)
Glucose-Capillary: 369 mg/dL — ABNORMAL HIGH (ref 70–99)

## 2021-10-04 LAB — BASIC METABOLIC PANEL
Anion gap: 11 (ref 5–15)
BUN: 34 mg/dL — ABNORMAL HIGH (ref 8–23)
CO2: 27 mmol/L (ref 22–32)
Calcium: 8.9 mg/dL (ref 8.9–10.3)
Chloride: 100 mmol/L (ref 98–111)
Creatinine, Ser: 1.59 mg/dL — ABNORMAL HIGH (ref 0.61–1.24)
GFR, Estimated: 46 mL/min — ABNORMAL LOW (ref >60)
Glucose, Bld: 275 mg/dL — ABNORMAL HIGH (ref 70–99)
Potassium: 4.1 mmol/L (ref 3.5–5.1)
Sodium: 138 mmol/L (ref 135–145)

## 2021-10-04 LAB — COOXEMETRY PANEL
Carboxyhemoglobin: 1.9 % — ABNORMAL HIGH (ref 0.5–1.5)
Methemoglobin: 1.3 % (ref 0.0–1.5)
O2 Saturation: 63.3 %
Total hemoglobin: 14.2 g/dL (ref 12.0–16.0)

## 2021-10-04 MED ORDER — INSULIN DETEMIR 100 UNIT/ML ~~LOC~~ SOLN
10.0000 [IU] | Freq: Two times a day (BID) | SUBCUTANEOUS | Status: DC
  Administered 2021-10-04 (×2): 10 [IU] via SUBCUTANEOUS
  Filled 2021-10-04 (×4): qty 0.1

## 2021-10-04 MED ORDER — INSULIN ASPART 100 UNIT/ML IJ SOLN
2.0000 [IU] | Freq: Three times a day (TID) | INTRAMUSCULAR | Status: DC
  Administered 2021-10-04 (×3): 2 [IU] via SUBCUTANEOUS

## 2021-10-04 NOTE — Care Management Important Message (Signed)
Important Message  Patient Details  Name: NYZIR DUBOIS MRN: [id number] Date of Birth: 08/19/1950   Medicare Important Message Given:  Yes     [name] 10/04/2021, 8:30 AM

## 2021-10-04 NOTE — Evaluation (Signed)
Occupational Therapy Evaluation Patient Details Name: [name] MRN: [id number] DOB: [birth year] Today's Date: 10/04/2021   History of Present Illness Patient is a 71 yo male admitted on 09/30/21 with shortness of breath. Admitted with CHF exacerbation. Patient recently admitted with same diagnosis on 09/03/21. 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 Patient endorses independence with all ADLs and IADLs, driving to get the majority of his meals via fast food venues. Currently, patient is independent with mobility, requires no assistive device, and independent with ADLs. Patient educated with regard to energy conservation, and monitoring salt intake with varying meal options other than fast food. Patient verbalizing agreement. No OT needs identified acutely or at discharge at this time. It was a pleasure evaluating this patient, please re-consult if further needs arise.      Recommendations for follow up therapy are one component of a multi-disciplinary discharge planning process, led by the attending physician.  Recommendations may be updated based on patient status, additional functional criteria and insurance authorization.   Follow Up Recommendations  No OT follow up    Assistance Recommended at Discharge PRN  Patient can return home with the following      Functional Status Assessment  Patient has had a recent decline in their functional status and demonstrates the ability to make significant improvements in function in a reasonable and predictable amount of time.  Equipment  Recommendations  None recommended by OT    Recommendations for Other Services       Precautions / Restrictions Precautions Precautions: Fall Restrictions Weight Bearing Restrictions: No      Mobility Bed Mobility Overal bed mobility: Independent                  Transfers Overall transfer level: Independent Equipment used: None                      Balance Overall balance assessment: Independent                                         ADL either performed or assessed with clinical judgement   ADL Overall ADL's : Independent                                       General ADL Comments: Good demonstration of ADLs, no assist needed     Vision Baseline Vision/History: 0 No visual deficits Ability to See in Adequate Light: 0 Adequate Patient Visual Report: No change from baseline       Perception     Praxis      Pertinent Vitals/Pain Pain Assessment Pain Assessment: No/denies pain     Hand Dominance     Extremity/Trunk Assessment Upper Extremity Assessment Upper Extremity Assessment: Overall WFL for tasks assessed   Lower Extremity Assessment Lower Extremity Assessment: Overall WFL for tasks assessed   Cervical / Trunk Assessment Cervical / Trunk Assessment: Normal  Communication Communication Communication: No difficulties   Cognition Arousal/Alertness: Awake/alert Behavior During Therapy: WFL for tasks assessed/performed Overall Cognitive Status: Within Functional Limits for tasks assessed                                       General Comments       Exercises     Shoulder Instructions      Home Living Family/patient expects to be discharged to:: Private residence Living Arrangements: Alone Available Help at Discharge: Family;Available PRN/intermittently Type of Home: House Home Access: Level entry     Home Layout: One level     Bathroom Shower/Tub: Medical illustrator: Standard Bathroom Accessibility: No   Home Equipment: Conservation officer, nature (2 wheels)   Additional Comments: Patient living alone and still driving and working at [employer]      Prior Functioning/Environment Prior Level of Function : Independent/Modified Independent;Working/employed;Driving             Mobility Comments: Ambulatory without device, still driving, 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 ADLs Comments: Independent, does not cook, endorses driving to fast food restaurants for meals        OT Problem List: Cardiopulmonary status limiting activity      OT Treatment/Interventions:      OT Goals(Current goals can be found in the care plan section) Acute Rehab OT Goals Patient Stated Goal: to go home and get back to work OT Goal Formulation: With patient Time For Goal Achievement: 10/18/21 Potential to Achieve Goals: Good  OT Frequency:      Co-evaluation              AM-PAC OT "6 Clicks" Daily Activity     Outcome Measure Help from another person eating meals?: None Help from another person taking care of personal grooming?: None Help from another person toileting, which includes using toliet, bedpan, or urinal?: None Help from another person bathing (including washing, rinsing, drying)?: None Help from another person to put on and taking off regular upper body clothing?: None Help from another person to put on and taking off regular lower body clothing?: None 6 Click Score: 24   End of Session Nurse Communication: Mobility status;Other (comment) (No OT/PT needs)  Activity Tolerance: Patient tolerated treatment well Patient left: in bed;with call bell/phone within reach;Other (comment) (Sitting EOB eating lunch)  OT Visit Diagnosis: Unsteadiness on feet (R26.81);Other abnormalities of gait and mobility (R26.89)                Time: 0962-8366 OT Time Calculation (min): 18 min Charges:  OT General Charges $OT Visit: 1  Visit OT Evaluation $OT Eval Moderate Complexity: 1 Mod  Corinne Ports E. Kilah Drahos, OTR/L Acute Rehabilitation Services [phone] [place] 10/04/2021, 1:00 PM

## 2021-10-04 NOTE — Progress Notes (Signed)
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  Was discharged from [facility] on 1/20 after treatment for CHF -Recently had a stress test which was read as high risk, no inducible ischemia, EF 13% -Back in the ED with dyspnea, lower extremity edema and orthopnea -Improving on high-dose diuretics  Subjective: -Feels much better overall, breathing close to baseline  Assessment and Plan:  * Acute on chronic systolic CHF (congestive heart failure) (HCC)-  -Volume overloaded on exam, BNP elevated, chest x-ray with small right pleural effusion, unchanged -Echo last month with EF 20-25%, severe LV dysfunction -Suspect dietary indiscretion is contributing significantly, he got divorced in November and has been eating all his meals outside for several months -Diuresed with IV Lasix 80 Mg twice daily, he is 16 L negative, weight down 30 Lbs -Continue Toprol, digoxin, Aldactone Farxiga, failed Entresto in the past -IV Lasix discontinued, plan to start torsemide tomorrow -Per heart failure team -Home tomorrow  Elevated troponin Chest pain CAD status post CABG Patient reports a few episodes of nonexertional left-sided chest pain last week, most recent episode 3 days ago.  Nuclear medicine stress test done 2 days ago was high risk.  High-sensitivity troponin mildly elevated but stable -Cardiology following, continue aspirin, beta-blocker, and statin.  Acute renal failure superimposed on stage 3a chronic kidney disease ([place])- (present on admission) -Baseline creatinine around 1.5 -Creatinine is relatively  stable, losartan on hold  Hypothyroidism -Continue Synthroid   OSA (obstructive sleep apnea)- (present on admission) -Continue nightly CPAP  Hypertension- (present on admission) Stable. -Meds as above  Insulin dependent type 2 diabetes mellitus (HCC) A1c 8.3 a month ago.   -Continue Wilder Glade, was hypoglycemic the other day, now with hyperglycemia, will increase insulin, add meal coverage  Iron deficiency -But not anemic, hemoglobin is 12.4 -Given IV iron, would benefit from gastroenterology eval/screening colonoscopy however may not be appropriate for sedation in the setting of severe cardiomyopathy  DVT prophylaxis: Lovenox Code Status: Full code Family Communication: Discussed with patient in detail, no family at bedside Disposition Plan: Home tomorrow   Consultants:  Cardiology  Procedures:   Antimicrobials:    Objective: Vitals:   10/04/21 0309 10/04/21 0429 10/04/21 0941 10/04/21 1131  BP: 107/68 112/70 101/66 108/75  Pulse: (!) 102  (!) 103 99  Resp: 18 18  18   Temp: (!) 97.5 F (36.4 C) 98 F (36.7 C)  98 F (36.7 C)  TempSrc: Oral Oral  Oral  SpO2: 99% 95%  99%  Weight: 62.8 kg     Height:        Intake/Output Summary (Last 24 hours) at 10/04/2021 1257 Last data filed at 10/04/2021 9326 Gross per 24 hour  Intake 970 ml  Output 4800 ml  Net -3830 ml   Filed Weights   10/02/21 0540 10/03/21 0331 10/04/21 0309  Weight: 68.5 kg 65.5 kg 62.8 kg    Examination:  General exam: Pleasant elderly male sitting up in bed, AAOx3, no distress HEENT: No JVD CVS: S1-S2, regular rate rhythm Lungs: Clear bilaterally Abdomen: Soft, nontender, bowel sounds present Extremities: No edema  Skin: No rashes Psychiatry:  Mood & affect appropriate.     Data Reviewed:   CBC: Recent Labs  Lab 09/30/21 1014 10/02/21 0450 10/03/21 0407  WBC 4.7 5.7 4.1  NEUTROABS 3.6  --   --   HGB 13.1 12.4* 13.0  HCT 41.1 36.4* 38.7*  MCV 74.6* 70.8* 71.8*  PLT 312 252  983   Basic Metabolic Panel: Recent Labs  Lab 09/30/21 1014 10/01/21 0405 10/02/21 0450 10/03/21 0407 10/04/21 0435  NA 138 138 140 141 138  K 3.8 4.1 3.0* 3.7 4.1  CL 105 105 104 103 100  CO2 19* 21* 26 28 27   GLUCOSE 211* 206* 125* 118* 275*  BUN 29* 30* 28* 25* 34*  CREATININE 1.66* 1.65* 1.41* 1.35* 1.59*  CALCIUM 8.9 9.1 8.4* 8.5* 8.9   GFR: Estimated Creatinine Clearance: 37.3 mL/min (A) (by C-G formula based on SCr of 1.59 mg/dL (H)). Liver Function Tests: Recent Labs  Lab 09/30/21 1014  AST 26  ALT 27  ALKPHOS 75  BILITOT 0.5  PROT 7.2  ALBUMIN 3.5   No results for input(s): LIPASE, AMYLASE in the last 168 hours. No results for input(s): AMMONIA in the last 168 hours. Coagulation Profile: No results for input(s): INR, PROTIME in the last 168 hours. Cardiac Enzymes: No results for input(s): CKTOTAL, CKMB, CKMBINDEX, TROPONINI in the last 168 hours. BNP (last 3 results) No results for input(s): PROBNP in the last 8760 hours. HbA1C: No results for input(s): HGBA1C in the last 72 hours. CBG: Recent Labs  Lab 10/03/21 1137 10/03/21 1509 10/03/21 2056 10/04/21 0558 10/04/21 1130  GLUCAP 154* 239* 400* 229* 195*   Lipid Profile: No results for input(s): CHOL, HDL, LDLCALC, TRIG, CHOLHDL, LDLDIRECT in the last 72 hours. Thyroid Function Tests: Recent Labs    10/02/21 0450  FREET4 1.23*   Anemia Panel: Recent Labs    10/02/21 0450  FERRITIN 15*  TIBC 353  IRON 21*   Urine analysis:    Component Value Date/Time   COLORURINE YELLOW 05/11/2018 1209   APPEARANCEUR HAZY (A) 05/11/2018 1209   LABSPEC 1.011 05/11/2018 1209   PHURINE 5.0 05/11/2018 1209   GLUCOSEU >=500 (A) 05/11/2018 1209   HGBUR LARGE (A) 05/11/2018 1209   BILIRUBINUR NEGATIVE 05/11/2018 1209   KETONESUR NEGATIVE 05/11/2018 1209   PROTEINUR NEGATIVE 05/11/2018 1209   UROBILINOGEN 1.0 01/01/2013 1157   NITRITE NEGATIVE 05/11/2018 1209   LEUKOCYTESUR NEGATIVE 05/11/2018 1209    Sepsis Labs: @LABRCNTIP (procalcitonin:4,lacticidven:4)  ) Recent Results (from the past 240 hour(s))  Resp Panel by RT-PCR (Flu A&B, Covid) Nasopharyngeal Swab     Status: None   Collection Time: 09/30/21 10:11 AM   Specimen: Nasopharyngeal Swab; Nasopharyngeal(NP) swabs in vial transport medium  Result Value Ref Range Status   SARS Coronavirus 2 by RT PCR NEGATIVE NEGATIVE Final    Comment: (NOTE) SARS-CoV-2 target nucleic acids are NOT DETECTED.  The SARS-CoV-2 RNA is generally detectable in upper respiratory specimens during the acute phase of infection. The lowest concentration of SARS-CoV-2 viral copies this assay can detect is 138 copies/mL. A negative result does not preclude SARS-Cov-2 infection and should not be used as the sole basis for treatment or other patient management decisions. A negative result may occur with  improper specimen collection/handling, submission of specimen other than nasopharyngeal swab, presence of viral mutation(s) within the areas targeted by this assay, and inadequate number of viral copies(<138 copies/mL). A negative result must be combined with clinical observations, patient history, and epidemiological information. The expected result is  Negative.  Fact Sheet for Patients:  EntrepreneurPulse.com.au  Fact Sheet for Healthcare Providers:  IncredibleEmployment.be  This test is no t yet approved or cleared by the [place] FDA and  has been authorized for detection and/or diagnosis of SARS-CoV-2 by FDA under an Emergency Use Authorization (EUA). This EUA will remain  in effect (meaning this test can be used) for the duration of the COVID-19 declaration under Section 564(b)(1) of the Act, 21 U.S.C.section 360bbb-3(b)(1), unless the authorization is terminated  or revoked sooner.       Influenza A by PCR NEGATIVE NEGATIVE Final   Influenza B by PCR NEGATIVE NEGATIVE Final    Comment: (NOTE) The  Xpert Xpress SARS-CoV-2/FLU/RSV plus assay is intended as an aid in the diagnosis of influenza from Nasopharyngeal swab specimens and should not be used as a sole basis for treatment. Nasal washings and aspirates are unacceptable for Xpert Xpress SARS-CoV-2/FLU/RSV testing.  Fact Sheet for Patients: EntrepreneurPulse.com.au  Fact Sheet for Healthcare Providers: IncredibleEmployment.be  This test is not yet approved or cleared by the [place] FDA and has been authorized for detection and/or diagnosis of SARS-CoV-2 by FDA under an Emergency Use Authorization (EUA). This EUA will remain in effect (meaning this test can be used) for the duration of the COVID-19 declaration under Section 564(b)(1) of the Act, 21 U.S.C. section 360bbb-3(b)(1), unless the authorization is terminated or revoked.  Performed at [facility], [place] [address] [place], [place] [address]      Radiology Studies: No results found.   Scheduled Meds:  amitriptyline  25 mg Oral QHS   aspirin EC  81 mg Oral Daily   Chlorhexidine Gluconate Cloth  6 each Topical Daily   digoxin  0.125 mg Oral Daily   empagliflozin  10 mg Oral Daily   enoxaparin (LOVENOX) injection  40 mg Subcutaneous Q24H   insulin aspart  0-5 Units Subcutaneous QHS   insulin aspart  0-9 Units Subcutaneous TID WC   insulin aspart  2 Units Subcutaneous TID WC   insulin detemir  10 Units Subcutaneous BID   levothyroxine  50 mcg Oral QAC breakfast   metoprolol succinate  50 mg Oral Daily   rosuvastatin  20 mg Oral QPM   sodium chloride flush  10-40 mL Intracatheter Q12H   spironolactone  25 mg Oral Daily   zolpidem  2.5 mg Oral QHS   Continuous Infusions:   LOS: 4 days    Time spent: 78min  Domenic Polite, MD [facility]   10/04/2021, 12:57 PM

## 2021-10-04 NOTE — Progress Notes (Signed)
Heart Failure Navigator Progress Note  Assessed for Heart & Vascular TOC clinic readiness.  Patient does not meet criteria due to AHF rounding team consulted this hospitalization.   Navigator available for educational resources. Pt attended HV TOC clinic appt 09/06/2021.  Pricilla Holm, MSN, RN Heart Failure Nurse Navigator [phone]

## 2021-10-04 NOTE — Progress Notes (Addendum)
Patient ID: [name], male   DOB: August 18, 1950, 71 y.o.   MRN: [id number]     Advanced Heart Failure Rounding Note  PCP-Cardiologist: Fransico Him, MD   Subjective:    Co-ox 63% this am.   Scr 1.41>1.35>1.59  Brisk diuresis yesterday. -4.7L with IV lasix. Weight down another 6 lb overnight. CVP 4.  BP stable.  Feeling well. Dyspnea significantly improved. Orthopnea resolved. No CP.    Objective:   Weight Range: 62.8 kg Body mass index is 19.86 kg/m.   Vital Signs:   Temp:  [97.5 F (36.4 C)-98.1 F (36.7 C)] 98 F (36.7 C) (02/20 0429) Pulse Rate:  [102] 102 (02/20 0309) Resp:  [18] 18 (02/20 0429) BP: (101-138)/(56-75) 112/70 (02/20 0429) SpO2:  [95 %-100 %] 95 % (02/20 0429) Weight:  [62.8 kg] 62.8 kg (02/20 0309) Last BM Date : 09/30/21  Weight change: Filed Weights   10/02/21 0540 10/03/21 0331 10/04/21 0309  Weight: 68.5 kg 65.5 kg 62.8 kg    Intake/Output:   Intake/Output Summary (Last 24 hours) at 10/04/2021 0929 Last data filed at 10/04/2021 0803 Gross per 24 hour  Intake 1170 ml  Output 5900 ml  Net -4730 ml      Physical Exam    General:  Thin, chronically ill appearing. HEENT: normal Neck: supple. no JVD. Carotids 2+ bilat; no bruits.  Cor: PMI nondisplaced. Regular rate & rhythm, tachy. No rubs, gallops, 1/6 systolic murmur RUSB Lungs: clear Abdomen: soft, nontender, nondistended. No hepatosplenomegaly.  Extremities: no cyanosis, clubbing, rash, edema, + RUE PICC Neuro: alert & orientedx3, cranial nerves grossly intact. moves all 4 extremities w/o difficulty. Affect pleasant    Telemetry   Sinus tach low 100s (personally reviewed)   Labs    CBC Recent Labs    10/02/21 0450 10/03/21 0407  WBC 5.7 4.1  HGB 12.4* 13.0  HCT 36.4* 38.7*  MCV 70.8* 71.8*  PLT 252 564   Basic Metabolic Panel Recent Labs    10/03/21 0407 10/04/21 0435  NA 141 138  K 3.7 4.1  CL 103 100  CO2 28 27  GLUCOSE 118* 275*  BUN 25* 34*   CREATININE 1.35* 1.59*  CALCIUM 8.5* 8.9   Liver Function Tests No results for input(s): AST, ALT, ALKPHOS, BILITOT, PROT, ALBUMIN in the last 72 hours.  No results for input(s): LIPASE, AMYLASE in the last 72 hours. Cardiac Enzymes No results for input(s): CKTOTAL, CKMB, CKMBINDEX, TROPONINI in the last 72 hours.  BNP: BNP (last 3 results) Recent Labs    08/30/21 0832 08/30/21 1215 09/30/21 1014  BNP 1,931.7* 1,424.1* 1,964.7*    ProBNP (last 3 results) No results for input(s): PROBNP in the last 8760 hours.   D-Dimer No results for input(s): DDIMER in the last 72 hours. Hemoglobin A1C No results for input(s): HGBA1C in the last 72 hours. Fasting Lipid Panel No results for input(s): CHOL, HDL, LDLCALC, TRIG, CHOLHDL, LDLDIRECT in the last 72 hours. Thyroid Function Tests No results for input(s): TSH, T4TOTAL, T3FREE, THYROIDAB in the last 72 hours.  Invalid input(s): FREET3   Other results:   Imaging    No results found.   Medications:     Scheduled Medications:  amitriptyline  25 mg Oral QHS   aspirin EC  81 mg Oral Daily   Chlorhexidine Gluconate Cloth  6 each Topical Daily   digoxin  0.125 mg Oral Daily   empagliflozin  10 mg Oral Daily   enoxaparin (LOVENOX) injection  40 mg  Subcutaneous Q24H   insulin aspart  0-5 Units Subcutaneous QHS   insulin aspart  0-9 Units Subcutaneous TID WC   insulin aspart  2 Units Subcutaneous TID WC   insulin detemir  10 Units Subcutaneous BID   levothyroxine  50 mcg Oral QAC breakfast   metoprolol succinate  50 mg Oral Daily   rosuvastatin  20 mg Oral QPM   sodium chloride flush  10-40 mL Intracatheter Q12H   spironolactone  25 mg Oral Daily   zolpidem  2.5 mg Oral QHS    Infusions:   PRN Medications: acetaminophen **OR** acetaminophen, sodium chloride flush    Assessment/Plan   Chronic  systolic CHF. - s/p [employer] Jude CRT-D 01/2020. Unable to place LV lead. Interestingly, he has a RBBB this admission and  had LBBB on prior ECGs.  Discussed with Dr. Lovena Le, with current RBBB a left bundle lead will not be likely to help.  - Had PYP 2020 not suggestive of amyloid.  - Echo 08/30/21: EF 20-25%, RV significantly reduced, RVSP 50 mmHg, moderate MR, moderate TR, mean gradient 2 mmHg across aortic valve prosthesis - NYHA IV with volume overload at admission. PICC placed, CVP 4 this morning with co-ox 63%.  He diuresed well and feels much better. Hold off on milrinone.  Stop IV lasix. Volume looks low. Plan to start po Torsemide tomorrow.  - Decreased Toprol to 50 mg daily  - Continue digoxin 0.125, dig level < 0.2 - Failed entresto in the past due to hypotension. Add losartan tomorrow if Scr stable, slight bump today with diuresis - Continue jardiance 10 mg daily  - Continue spironolactone 25 mg daily - Corlanor previously stopped d/t cost.  - Looks frail and had suspected low output but co-ox has been ok.    2. CAD -H/O CABG 2013 -Mild HS troponin elevation on admit with flat trend. Likely d/t CHF. - No chest pain.  - On asa 81 mg daily - On rosuvastatin.   3. DMII -Insulin dependent -Hgb A1C 8.3 in 01/23 -On jardiance   4. OSA - started on CPAP during his recent hospitalization   5. Aortic valve stenosis - S/P TAVR 2019 with 29 mm S3 - Valve okay on recent echo   7. Hypothyroid -On levothyroxine.    8. Microcytic anemia -Hgb 12-13 -MCV low - Low transferrin saturation, given feraheme.   Stopped IV lasix d/t low volume. Plan to start po Torsemide tomorrow. Potential discharge home tomorrow.  Would likely benefit from Paramedicine. Currently lives alone. Will arrange.  Mobilize today.  Length of Stay: 4  [facility], [place], PA-C  10/04/2021, 9:29 AM  Advanced Heart Failure Team Pager [phone] (M-F; 7a - 5p)  Please contact [facility] for night-coverage after hours (5p -7a ) and weekends on [email or web address]  Patient seen and examined with the above-signed Advanced Practice  Provider and/or Housestaff. I personally reviewed laboratory data, imaging studies and relevant notes. I independently examined the patient and formulated the important aspects of the plan. I have edited the note to reflect any of my changes or salient points. I have personally discussed the plan with the patient and/or family.  Walking the halls. Denies CP or SOB. No orthopnea or PND. CVP down. Co-ox stable.    General:  Thin male. No resp difficulty HEENT: normal Neck: supple. no JVD. Carotids 2+ bilat; no bruits. No lymphadenopathy or thryomegaly appreciated. Cor: PMI nondisplaced. Regular rate & rhythm. No rubs, gallops or murmurs. Lungs: clear Abdomen: soft, nontender, nondistended.  No hepatosplenomegaly. No bruits or masses. Good bowel sounds. Extremities: no cyanosis, clubbing, rash, edema Neuro: alert & orientedx3, cranial nerves grossly intact. moves all 4 extremities w/o difficulty. Affect pleasant  Volume status much improved. Co-ox stable. Will switch diuretics to po. Possible d/c tomorrow. Will arrange Paramedicine.   Glori Bickers, MD  12:01 PM

## 2021-10-04 NOTE — Progress Notes (Signed)
Mobility Specialist Progress Note:   10/04/21 1045  Mobility  Activity Ambulated with assistance in hallway  Level of Assistance Standby assist, set-up cues, supervision of patient - no hands on  Assistive Device None  Distance Ambulated (ft) 470 ft  Activity Response Tolerated well  $Mobility charge 1 Mobility   Pt eager for mobility session this am. Asx throughout ambulation, with no physical assist required despite not getting OOB since admission. Pt back in bed with all needs met, MD & team present.   Nelta Numbers Acute Rehab Phone: [phone] Office Phone: [phone]

## 2021-10-04 NOTE — Evaluation (Signed)
Physical Therapy Evaluation Patient Details Name: [name] MRN: [id number] DOB: 08-14-1951 Today's Date: 10/04/2021  History of Present Illness  Patient is a 71 yo male admitted on 09/30/21 with shortness of breath. Admitted with CHF exacerbation. Patient recently admitted with same diagnosis on 09/03/21. PMH includes: CAD, CABG, chronic systolic CHF EF of 66%, s/p AICD, aortic stenosis s/p TAVR, Hx of squamous cell carcinoma of the LLL s/p radiation, CKD, DM, aortic aneurysm, HTN, CVA, OSA not on CPAP.  Clinical Impression  Patient lives alone and is independent for ADLs/IADLs and ambulation PTA, working at [employer]. Today, pt tolerated ambulation and stair training mod I-independent with no evidence of imbalance or difficulties. Mild SOB noted with 1 rest break post stairs but VSS with HR up to 115 bpm max with activity. Education re: energy conservation techniques, exercise/activity modifications, diet/salt intake etc. Encouraged walking hallways daily. RN notified. Pt reports feeling at baseline with regards to mobility and does not require further skilled therapy services. All education completed. Discharge from therapy.       Recommendations for follow up therapy are one component of a multi-disciplinary discharge planning process, led by the attending physician.  Recommendations may be updated based on patient status, additional functional criteria and insurance authorization.  Follow Up Recommendations No PT follow up    Assistance Recommended at Discharge None  Patient can return home with the following       Equipment Recommendations None recommended by PT  Recommendations for Other Services       Functional Status Assessment Patient has not had a recent decline in their functional status     Precautions / Restrictions Precautions Precautions: None Restrictions Weight Bearing Restrictions: No      Mobility  Bed Mobility Overal bed mobility: Independent                   Transfers Overall transfer level: Independent Equipment used: None               General transfer comment: Stood from EOB x2 without difficulty.    Ambulation/Gait Ambulation/Gait assistance: Modified independent (Device/Increase time) Gait Distance (Feet): 350 Feet Assistive device: None Gait Pattern/deviations: Step-through pattern, Decreased stride length   Gait velocity interpretation: >2.62 ft/sec, indicative of community ambulatory   General Gait Details: Steady gait with mild drifting but no overt LOB. Reports feeling at baseline. VSS with HR 115 bpm max with activity.  Stairs Stairs: Yes Stairs assistance: Modified independent (Device/Increase time) Stair Management: One rail Right, Step to pattern Number of Stairs: 13 General stair comments: Use of rail for support. Rest break at tope of stairs with mild SOB.  Wheelchair Mobility    Modified Rankin (Stroke Patients Only)       Balance Overall balance assessment: Independent                                           Pertinent Vitals/Pain Pain Assessment Pain Assessment: No/denies pain    Home Living Family/patient expects to be discharged to:: Private residence Living Arrangements: Alone Available Help at Discharge: Family;Available PRN/intermittently Type of Home: House Home Access: Level entry       Home Layout: One level Home Equipment: Conservation officer, nature (2 wheels) Additional Comments: Patient living alone and still driving and working at [employer]    Prior Function Prior Level of Function : Independent/Modified Independent;Working/employed;Driving  Mobility Comments: Ambulatory without device, still driving, 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 ADLs Comments: Independent, does not cook, endorses driving to fast food restaurants for meals     Hand Dominance   Dominant Hand: Right    Extremity/Trunk Assessment   Upper Extremity  Assessment Upper Extremity Assessment: Overall WFL for tasks assessed    Lower Extremity Assessment Lower Extremity Assessment: Overall WFL for tasks assessed    Cervical / Trunk Assessment Cervical / Trunk Assessment: Normal  Communication   Communication: No difficulties  Cognition Arousal/Alertness: Awake/alert Behavior During Therapy: WFL for tasks assessed/performed Overall Cognitive Status: Within Functional Limits for tasks assessed                                          General Comments General comments (skin integrity, edema, etc.): VSS on RA.    Exercises     Assessment/Plan    PT Assessment Patient does not need any further PT services  PT Problem List         PT Treatment Interventions      PT Goals (Current goals can be found in the Care Plan section)  Acute Rehab PT Goals Patient Stated Goal: to go home PT Goal Formulation: All assessment and education complete, DC therapy    Frequency       Co-evaluation               AM-PAC PT "6 Clicks" Mobility  Outcome Measure Help needed turning from your back to your side while in a flat bed without using bedrails?: None Help needed moving from lying on your back to sitting on the side of a flat bed without using bedrails?: None Help needed moving to and from a bed to a chair (including a wheelchair)?: None Help needed standing up from a chair using your arms (e.g., wheelchair or bedside chair)?: None Help needed to walk in hospital room?: None Help needed climbing 3-5 steps with a railing? : None 6 Click Score: 24    End of Session   Activity Tolerance: Patient tolerated treatment well Patient left: in bed;with call bell/phone within reach Nurse Communication: Mobility status;Other (comment) (can walk halls) PT Visit Diagnosis: Unsteadiness on feet (R26.81)    Time: 1510-1530 PT Time Calculation (min) (ACUTE ONLY): 20 min   Charges:   PT Evaluation $PT Eval Low  Complexity: 1 Low          [name], PT, DPT Acute Rehabilitation Services Pager [phone] Office [phone]     [name] 10/04/2021, 4:06 PM

## 2021-10-04 NOTE — TOC Benefit Eligibility Note (Signed)
Patient Teacher, English as a foreign language completed.    The patient is currently admitted and upon discharge could be taking Corlanor 5 mg.  The current 30 day co-pay is, $456.02 due to a $305.63 deductible.   The patient is insured through [place], [place] Patient Advocate Specialist [place] Patient Advocate Team Direct Number: [phone]  Fax: [phone]

## 2021-10-04 NOTE — Progress Notes (Signed)
Heart and Vascular Care Navigation  10/04/2021  [name] [birth year] [id number]  Reason for Referral: Outpatient HF CSW referred to speak with pt about enrollment in [place] with patient face to face for initial visit for Heart and Vascular Care Coordination.                                                                                                                                Paramedicine Initial Assessment:  Housing:  In what kind of housing do you live? House/apt/trailer/shelter? house  Do you live with anyone? No- wife was living there but they are currently separated with her moving out in the fall  Are you currently worried about losing your housing? no  Social:  What is your current marital status? separated  Do you have any children? Two sons on file   Income:  What is your current source of income? Retirement income and full time employment at [employer]  How hard is it for you to pay for the basics like food housing, medical care, and utilities? Not very hard   Insurance:  Are you currently insured? yes  Do you have prescription coverage? yes   Transportation:  Do you have transportation to your medical appointments? Yes has his own car    Daily Health Needs: Do you have a working scale at home? yes  How do you manage your medications at home? Take them out of the bottle  Do you ever take your medications differently than prescribed? no  Do you have issues affording your medications? Reports he was having trouble with his corlanor ($200/month) but that this medication has been changed.  Is worried about the new prescriptions he will be on so might need assistance with this if insurance doesn't cover sufficiently  If yes, has this ever prevented you from obtaining medications? Yes- the corlanor  Do you have any concerns with mobility at home? no  Do you use any assistive devices at home or have PCS at home?  no  Do you have a PCP? Dr. Lauretta Grill [place]  Do you have any trouble reading or writing? no  Are there any additional barriers you see to getting the care you need? None at this time  CSW will continue to follow through paramedicine program and assist as needed.  HRT/VAS Care Coordination     Patients Home Cardiology Office --  HF [facility]   Outpatient Care Team Social Worker   Social Worker Name: Raquel Sarna, [name] [phone]   Living arrangements for the past 2 months Single Family Home   Lives with: Self   Patient Current Insurance Coverage Traditional Medicare   Patient Has Concern With Paying Medical Bills No   Does Patient Have Prescription Coverage? Yes   Home Assistive Devices/Equipment None   Current home services DME  CPAP       Social History:  SDOH Screenings   Alcohol Screen: Low Risk    Last Alcohol Screening Score (AUDIT): 0  Depression (PHQ2-9): Not on file  Financial Resource Strain: Low Risk    Difficulty of Paying Living Expenses: Not very hard  Food Insecurity: No Food Insecurity   Worried About Running Out of Food in the Last Year: Never true   Ran Out of Food in the Last Year: Never true  Housing: [place] Risk Score: 0  Physical Activity: Not on file  Social Connections: Not on file  Stress: Not on file  Tobacco Use: Medium Risk   Smoking Tobacco Use: Former   Smokeless Tobacco Use: Never   Passive Exposure: Not on file  Transportation Needs: No Transportation Needs   Lack of Transportation (Medical): No   Lack of Transportation (Non-Medical): No     Follow-up plan:    CSW sending out referral for [employer] program for assignment and scheduling of initial visit.  Jorge Ny, LCSW Clinical Social Worker Advanced [facility] Failure Clinic Desk#: [phone] Cell#: [phone]

## 2021-10-04 NOTE — TOC CM/SW Note (Signed)
HF TOC CM arranged HF paramedicine team to follow post dc. Pt lives alone and recently his wife passed. Has family that will assist in home as needed. Pt reports he still works. [place], Heart Failure TOC CM [phone]

## 2021-10-05 ENCOUNTER — Other Ambulatory Visit (HOSPITAL_COMMUNITY): Payer: Self-pay

## 2021-10-05 ENCOUNTER — Telehealth (HOSPITAL_COMMUNITY): Payer: Self-pay

## 2021-10-05 LAB — BASIC METABOLIC PANEL
Anion gap: 9 (ref 5–15)
BUN: 30 mg/dL — ABNORMAL HIGH (ref 8–23)
CO2: 25 mmol/L (ref 22–32)
Calcium: 9 mg/dL (ref 8.9–10.3)
Chloride: 102 mmol/L (ref 98–111)
Creatinine, Ser: 1.52 mg/dL — ABNORMAL HIGH (ref 0.61–1.24)
GFR, Estimated: 48 mL/min — ABNORMAL LOW (ref >60)
Glucose, Bld: 200 mg/dL — ABNORMAL HIGH (ref 70–99)
Potassium: 4.1 mmol/L (ref 3.5–5.1)
Sodium: 136 mmol/L (ref 135–145)

## 2021-10-05 LAB — COOXEMETRY PANEL
Carboxyhemoglobin: 0.6 % (ref 0.5–1.5)
Methemoglobin: 1.2 % (ref 0.0–1.5)
O2 Saturation: 58 %
Total hemoglobin: 14.6 g/dL (ref 12.0–16.0)

## 2021-10-05 LAB — GLUCOSE, CAPILLARY
Glucose-Capillary: 209 mg/dL — ABNORMAL HIGH (ref 70–99)
Glucose-Capillary: 163 mg/dL — ABNORMAL HIGH (ref 70–99)

## 2021-10-05 MED ORDER — LOSARTAN POTASSIUM 25 MG PO TABS: 12.5000 mg | ORAL_TABLET | Freq: Every day | ORAL | Status: DC

## 2021-10-05 MED ORDER — TORSEMIDE 20 MG PO TABS
40.0000 mg | ORAL_TABLET | Freq: Every day | ORAL | 0 refills | Status: DC
  Filled 2021-10-05: qty 60, 30d supply, fill #0

## 2021-10-05 MED ORDER — LOSARTAN POTASSIUM 25 MG PO TABS
12.5000 mg | ORAL_TABLET | Freq: Every day | ORAL | Status: DC
  Administered 2021-10-05: 12.5 mg via ORAL
  Filled 2021-10-05: qty 1

## 2021-10-05 MED ORDER — TORSEMIDE 20 MG PO TABS
40.0000 mg | ORAL_TABLET | Freq: Every day | ORAL | Status: DC
Start: 2021-10-05 — End: 2021-10-05
  Administered 2021-10-05: 40 mg via ORAL
  Filled 2021-10-05: qty 2

## 2021-10-05 MED ORDER — POTASSIUM CHLORIDE CRYS ER 20 MEQ PO TBCR
20.0000 meq | EXTENDED_RELEASE_TABLET | Freq: Every day | ORAL | Status: DC
  Administered 2021-10-05: 20 meq via ORAL
  Filled 2021-10-05: qty 1

## 2021-10-05 MED ORDER — METOPROLOL SUCCINATE ER 100 MG PO TB24: 50.0000 mg | ORAL_TABLET | Freq: Every day | ORAL | Status: DC

## 2021-10-05 MED ORDER — POTASSIUM CHLORIDE CRYS ER 20 MEQ PO TBCR
20.0000 meq | EXTENDED_RELEASE_TABLET | Freq: Every day | ORAL | 0 refills | Status: DC
  Filled 2021-10-05: qty 30, 30d supply, fill #0

## 2021-10-05 MED ORDER — INSULIN DETEMIR 100 UNIT/ML ~~LOC~~ SOLN
15.0000 [IU] | Freq: Two times a day (BID) | SUBCUTANEOUS | Status: DC
  Administered 2021-10-05: 15 [IU] via SUBCUTANEOUS
  Filled 2021-10-05 (×2): qty 0.15

## 2021-10-05 MED ORDER — SPIRONOLACTONE 25 MG PO TABS
25.0000 mg | ORAL_TABLET | Freq: Every day | ORAL | 0 refills | Status: DC
  Filled 2021-10-05: qty 30, 30d supply, fill #0

## 2021-10-05 MED ORDER — INSULIN ASPART 100 UNIT/ML IJ SOLN
5.0000 [IU] | Freq: Three times a day (TID) | INTRAMUSCULAR | Status: DC
  Administered 2021-10-05 (×2): 5 [IU] via SUBCUTANEOUS

## 2021-10-05 MED ORDER — DIGOXIN 125 MCG PO TABS
0.1250 mg | ORAL_TABLET | Freq: Every day | ORAL | 0 refills | Status: DC
  Filled 2021-10-05: qty 30, 30d supply, fill #0

## 2021-10-05 MED ORDER — DAPAGLIFLOZIN PROPANEDIOL 10 MG PO TABS
10.0000 mg | ORAL_TABLET | Freq: Every day | ORAL | Status: DC
Start: 2021-10-06 — End: 2021-10-05

## 2021-10-05 NOTE — Progress Notes (Addendum)
Patient ID: [name], male   DOB: [birth year], 71 y.o.   MRN: [id number]     Advanced Heart Failure Rounding Note  PCP-Cardiologist: Fransico Him, MD   Subjective:    Co-ox 58% this am. CVP unhooked.  Scr 1.41>1.35>1.59>1.52  Neg 1.4L yesterday. Weight up 2lb.   BP stable.  Feels great. Denies dyspnea. No orthopnea. Ambulated halls multiple times last night and this am.    Objective:   Weight Range: 63.8 kg Body mass index is 20.18 kg/m.   Vital Signs:   Temp:  [97.6 F (36.4 C)-98 F (36.7 C)] 97.6 F (36.4 C) (02/21 0400) Pulse Rate:  [99] 99 (02/20 1131) Resp:  [18] 18 (02/20 1131) BP: (108-121)/(71-77) 121/77 (02/21 0432) SpO2:  [96 %-99 %] 96 % (02/20 2013) Weight:  [63.8 kg] 63.8 kg (02/21 0400) Last BM Date : 10/02/21  Weight change: Filed Weights   10/03/21 0331 10/04/21 0309 10/05/21 0400  Weight: 65.5 kg 62.8 kg 63.8 kg    Intake/Output:   Intake/Output Summary (Last 24 hours) at 10/05/2021 1011 Last data filed at 10/05/2021 0945 Gross per 24 hour  Intake 1680 ml  Output 2950 ml  Net -1270 ml      Physical Exam    General:  Thin, chronically ill appearing. HEENT: normal Neck: supple. no JVD. Carotids 2+ bilat; no bruits.  Cor: PMI nondisplaced. Regular rate & rhythm, tachy. No rubs, gallops or murmurs. Lungs: clear Abdomen: soft, nontender, nondistended.  Extremities: no cyanosis, clubbing, rash, edema, + RUE PICC Neuro: alert & orientedx3, cranial nerves grossly intact. moves all 4 extremities w/o difficulty. Affect pleasant   Telemetry   Sinus tach 100s-110s (personally reviewed)   Labs    CBC Recent Labs    10/03/21 0407  WBC 4.1  HGB 13.0  HCT 38.7*  MCV 71.8*  PLT 008   Basic Metabolic Panel Recent Labs    10/04/21 0435 10/05/21 0440  NA 138 136  K 4.1 4.1  CL 100 102  CO2 27 25  GLUCOSE 275* 200*  BUN 34* 30*  CREATININE 1.59* 1.52*  CALCIUM 8.9 9.0   Liver Function Tests No results for input(s): AST,  ALT, ALKPHOS, BILITOT, PROT, ALBUMIN in the last 72 hours.  No results for input(s): LIPASE, AMYLASE in the last 72 hours. Cardiac Enzymes No results for input(s): CKTOTAL, CKMB, CKMBINDEX, TROPONINI in the last 72 hours.  BNP: BNP (last 3 results) Recent Labs    08/30/21 0832 08/30/21 1215 09/30/21 1014  BNP 1,931.7* 1,424.1* 1,964.7*    ProBNP (last 3 results) No results for input(s): PROBNP in the last 8760 hours.   D-Dimer No results for input(s): DDIMER in the last 72 hours. Hemoglobin A1C No results for input(s): HGBA1C in the last 72 hours. Fasting Lipid Panel No results for input(s): CHOL, HDL, LDLCALC, TRIG, CHOLHDL, LDLDIRECT in the last 72 hours. Thyroid Function Tests No results for input(s): TSH, T4TOTAL, T3FREE, THYROIDAB in the last 72 hours.  Invalid input(s): FREET3   Other results:   Imaging    No results found.   Medications:     Scheduled Medications:  amitriptyline  25 mg Oral QHS   aspirin EC  81 mg Oral Daily   Chlorhexidine Gluconate Cloth  6 each Topical Daily   digoxin  0.125 mg Oral Daily   empagliflozin  10 mg Oral Daily   enoxaparin (LOVENOX) injection  40 mg Subcutaneous Q24H   insulin aspart  0-5 Units Subcutaneous QHS   insulin  aspart  0-9 Units Subcutaneous TID WC   insulin aspart  5 Units Subcutaneous TID WC   insulin detemir  15 Units Subcutaneous BID   levothyroxine  50 mcg Oral QAC breakfast   metoprolol succinate  50 mg Oral Daily   rosuvastatin  20 mg Oral QPM   sodium chloride flush  10-40 mL Intracatheter Q12H   spironolactone  25 mg Oral Daily   zolpidem  2.5 mg Oral QHS    Infusions:   PRN Medications: acetaminophen **OR** acetaminophen, sodium chloride flush    Assessment/Plan   Chronic  systolic CHF. - s/p [employer] CRT-D 01/2020. Unable to place LV lead. Interestingly, he has a RBBB this admission and had LBBB on prior ECGs.  Discussed with Dr. Lovena Le, with current RBBB a left bundle lead will not be  likely to help.  - Had PYP 2020 not suggestive of amyloid.  - Echo 08/30/21: EF 20-25%, RV significantly reduced, RVSP 50 mmHg, moderate MR, moderate TR, mean gradient 2 mmHg across aortic valve prosthesis - NYHA IV with volume overload at admission. PICC placed, co-ox 58% this am.  Not started on milrinone. - Volume looks okay. Weight up 2 lb. On 40 mg Furosemide daily at home. Will start 40 mg Torsemide daily.  - Decreased Toprol to 50 mg daily  - Continue digoxin 0.125, dig level < 0.2 - Failed entresto in the past due to hypotension. Add losartan 12.5 mg daily.  - Continue farxiga 10 mg daily  - Continue spironolactone 25 mg daily - Corlanor previously stopped d/t cost.  - Looks frail and had suspected low output but co-ox has been ok.    2. CAD - H/O CABG 2013 - Mild HS troponin elevation on admit with flat trend. Likely d/t CHF. - No chest pain.  - On asa 81 mg daily - On rosuvastatin.   3. DMII - Insulin dependent - Hgb A1C 8.3 in 01/23 - On farxiga  4. OSA - started on CPAP during his recent hospitalization   5. Aortic valve stenosis - S/P TAVR 2019 with 29 mm S3 - Valve okay on recent echo   7. Hypothyroid - On levothyroxine.    8. Microcytic anemia - Hgb 12-13 - MCV low - Low transferrin saturation, given feraheme.   Would benefit from Paramedicine. Currently lives alone. Will arrange.  PT/OT evaluated. No discharge needs   Okay for discharge today from HF perspective. Will arrange f/u in HF clinic.  Medications: -ASA 81 mg -Farxiga 10 mg daily -Digoxin 0.125 mg daily -Metoprolol xl 50 mg daily -Losartan 12.5 mg daily -Torsemide 40 mg daily -Spiro 25 mg daily -Potassium chloride 20 mEq daily -Rosuvastatin 20 mg daily  Length of Stay: 5  FINCH, LINDSAY N, PA-C  10/05/2021, 10:11 AM  Advanced Heart Failure Team Pager [phone] (M-F; 7a - 5p)  Please contact [facility] for night-coverage after hours (5p -7a ) and weekends on  [email or web address]  Patient seen and examined with the above-signed Advanced Practice Provider and/or Housestaff. I personally reviewed laboratory data, imaging studies and relevant notes. I independently examined the patient and formulated the important aspects of the plan. I have edited the note to reflect any of my changes or salient points. I have personally discussed the plan with the patient and/or family.  Denies CP or SOB. Walking halls.   General:  Thin. No resp difficulty HEENT: normal Neck: supple. no JVD. Carotids 2+ bilat; no bruits. No lymphadenopathy or thryomegaly appreciated. Cor: PMI nondisplaced. Regular  rate & rhythm. No rubs, gallops or murmurs. Lungs: clear Abdomen: soft, nontender, nondistended. No hepatosplenomegaly. No bruits or masses. Good bowel sounds. Extremities: no cyanosis, clubbing, rash, edema Neuro: alert & orientedx3, cranial nerves grossly intact. moves all 4 extremities w/o difficulty. Affect pleasant  Ok for d/c today on above medications. We will arrange f/u in HF Clinic.   Glori Bickers, MD  5:33 PM

## 2021-10-05 NOTE — Plan of Care (Signed)
Problem: Education: Goal: Ability to demonstrate management of disease process will improve Outcome: Completed/Met Goal: Ability to verbalize understanding of medication therapies will improve Outcome: Completed/Met Goal: Individualized Educational Video(s) Outcome: Completed/Met   Problem: Activity: Goal: Capacity to carry out activities will improve Outcome: Completed/Met   Problem: Cardiac: Goal: Ability to achieve and maintain adequate cardiopulmonary perfusion will improve Outcome: Completed/Met   Problem: Education: Goal: Knowledge of General Education information will improve Description: Including pain rating scale, medication(s)/side effects and non-pharmacologic comfort measures Outcome: Completed/Met   Problem: Health Behavior/Discharge Planning: Goal: Ability to manage health-related needs will improve Outcome: Completed/Met   Problem: Clinical Measurements: Goal: Ability to maintain clinical measurements within normal limits will improve Outcome: Completed/Met Goal: Will remain free from infection Outcome: Completed/Met Goal: Diagnostic test results will improve Outcome: Completed/Met Goal: Respiratory complications will improve Outcome: Completed/Met Goal: Cardiovascular complication will be avoided Outcome: Completed/Met   Problem: Activity: Goal: Risk for activity intolerance will decrease Outcome: Completed/Met   Problem: Nutrition: Goal: Adequate nutrition will be maintained Outcome: Completed/Met   Problem: Coping: Goal: Level of anxiety will decrease Outcome: Completed/Met   Problem: Elimination: Goal: Will not experience complications related to bowel motility Outcome: Completed/Met Goal: Will not experience complications related to urinary retention Outcome: Completed/Met   Problem: Pain Managment: Goal: General experience of comfort will improve Outcome: Completed/Met   Problem: Safety: Goal: Ability to remain free from injury will  improve Outcome: Completed/Met   Problem: Skin Integrity: Goal: Risk for impaired skin integrity will decrease Outcome: Completed/Met   

## 2021-10-05 NOTE — Discharge Summary (Signed)
Physician Discharge Summary  [name] FIE:[id number] DOB: Apr 27, 1951 DOA: 09/30/2021  PCP: [name], MD  Admit date: 09/30/2021 Discharge date: 10/05/2021  Time spent: 35 minutes  Recommendations for Outpatient Follow-up:  Advanced heart failure clinic, 2/27, please check BMP at follow-up PCP Dr.Koirala in 1 to 2 weeks, titrate insulin dose as needed Continue diet education  Discharge Diagnoses:  Principal Problem:   Acute on chronic systolic CHF (congestive heart failure) (HCC) Active Problems:   Acute renal failure superimposed on stage 3a chronic kidney disease (HCC)   CAD (coronary artery disease), native coronary artery   Insulin dependent type 2 diabetes mellitus (HCC)   Hypertension   OSA (obstructive sleep apnea)   Hypothyroidism   Elevated troponin   Normal anion gap metabolic acidosis   Discharge Condition: Stable  Diet recommendation: Low-sodium, diabetic  Filed Weights   10/03/21 0331 10/04/21 0309 10/05/21 0400  Weight: 65.5 kg 62.8 kg 63.8 kg    History of present illness:  70/M w/ CAD, CABG, chronic systolic CHF EF of 16%, status post AICD, aortic stenosis status post TAVR, 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  Was discharged from [facility] on 1/20 after treatment for CHF -Recently had a stress test which was read as high risk, no inducible ischemia, EF 13% -Back in the ED with dyspnea, lower extremity edema and orthopnea  Hospital Course:    Acute on chronic systolic CHF (congestive heart failure) ([place])-  -Volume overloaded on exam, BNP elevated, chest x-ray with small right pleural effusion, unchanged -Echo last month with EF 20-25%, severe LV dysfunction -Suspect dietary indiscretion is contributing significantly, he got divorced in November and has been eating  all his meals outside for several months -Diuresed with IV Lasix 80 Mg twice daily, he is 16 L negative, weight down 30 Lbs -Continue Toprol, digoxin, Aldactone Farxiga, failed Entresto in the past -Transition to oral torsemide 40 Mg daily -Has close follow-up arranged in heart failure clinic next week 2/27, please check BMP at follow-up   Elevated troponin Chest pain CAD status post CABG Patient reports a few episodes of nonexertional left-sided chest pain last week, most recent episode 3 days ago.  Nuclear medicine stress test done 2 days ago was high risk.  High-sensitivity troponin mildly elevated but stable -Cardiology following, continue aspirin, beta-blocker, and statin.   Acute renal failure superimposed on stage 3a chronic kidney disease ([place])- (present on admission) -Baseline creatinine around 1.5 -Creatinine is relatively stable, losartan resumed   Hypothyroidism -Continue Synthroid    OSA (obstructive sleep apnea)- (present on admission) -Continue nightly CPAP   Hypertension- (present on admission) Stable. -Meds as above   Insulin dependent type 2 diabetes mellitus (HCC) A1c 8.3 a month ago.   -Continue Farxiga, continue Levemir and meal coverage NovoLog   Iron deficiency -But not anemic, hemoglobin is 12.4 -Given IV iron, would benefit from gastroenterology eval/screening colonoscopy however may not be appropriate for sedation in the setting of severe cardiomyopathy  Discharge Exam: Vitals:   10/05/21 0432 10/05/21 1123  BP: 121/77 123/71  Pulse:  100  Resp:  20  Temp:  97.7 F (36.5 C)  SpO2:  97%   General exam: Pleasant elderly male sitting up in bed, AAOx3, no distress HEENT: No JVD CVS: S1-S2, regular rate rhythm Lungs: Clear bilaterally Abdomen: Soft, nontender, bowel  sounds present Extremities: No edema  Skin: No rashes Psychiatry:  Mood & affect appropriate.     Discharge Instructions    Allergies as of 10/05/2021       Reactions    Atorvastatin Other (See Comments)   Muscle pain Other reaction(s): cramps   Entresto [sacubitril-valsartan] Other (See Comments)   hypotension   Adhesive [tape] Rash        Medication List     STOP taking these medications    furosemide 20 MG tablet Commonly known as: LASIX       TAKE these medications    amitriptyline 25 MG tablet Commonly known as: ELAVIL Take 25 mg by mouth at bedtime.   aspirin 81 MG EC tablet Take 1 tablet (81 mg total) by mouth daily. Swallow whole.   B-D ULTRAFINE III SHORT PEN 31G X 8 MM Misc Generic drug: Insulin Pen Needle 1 each by Other route daily as needed (GLUCOSE TESTING (INSULINE NEEDLE)).   digoxin 0.125 MG tablet Commonly known as: Lanoxin Take 1 tablet (0.125 mg total) by mouth daily.   Farxiga 10 MG Tabs tablet Generic drug: dapagliflozin propanediol Take 1 tablet (10 mg total) by mouth daily.   FISH OIL PO Take 1 tablet by mouth daily.   insulin detemir 100 UNIT/ML injection Commonly known as: LEVEMIR Inject 10-18 Units into the skin See admin instructions. 18 units in the morning, 10 units at bedtime   insulin lispro 100 UNIT/ML KwikPen Commonly known as: HUMALOG Inject 2-3 Units into the skin See admin instructions. Per sliding scale at Breakfast and Dinner   levothyroxine 50 MCG tablet Commonly known as: SYNTHROID Take 50 mcg by mouth daily before breakfast.   losartan 25 MG tablet Commonly known as: COZAAR Take 0.5 tablets (12.5 mg total) by mouth daily. What changed: See the new instructions.   metFORMIN 500 MG tablet Commonly known as: GLUCOPHAGE Take 2 tablets (1,000 mg total) by mouth 2 (two) times daily with a meal.   metoprolol succinate 100 MG 24 hr tablet Commonly known as: TOPROL-XL Take 0.5 tablets (50 mg total) by mouth daily. Take with or immediately following a meal. What changed: how much to take   multivitamin tablet Take 1 tablet by mouth daily.   ONE TOUCH ULTRA TEST test  strip Generic drug: glucose blood 1 each by Other route as needed for other.   potassium chloride SA 20 MEQ tablet Commonly known as: KLOR-CON M Take 1 tablet (20 mEq total) by mouth daily.   simvastatin 20 MG tablet Commonly known as: ZOCOR Take 20 mg by mouth every evening.   spironolactone 25 MG tablet Commonly known as: ALDACTONE Take 1/2 tablet (12.5 mg total) by mouth daily.   SUPER B COMPLEX PO Take 1 tablet by mouth daily.   Torsemide 40 MG Tabs Take 40 mg by mouth daily.   zolpidem 5 MG tablet Commonly known as: AMBIEN Take 2.5 mg by mouth at bedtime.       Allergies  Allergen Reactions   Atorvastatin Other (See Comments)    Muscle pain Other reaction(s): cramps   Entresto [Sacubitril-Valsartan] Other (See Comments)    hypotension   Adhesive [Tape] Rash    Follow-up Information     Koirala, Dibas, MD .   Specialty: Family Medicine Contact information: [address] [place] [address] [phone]         [name], MD .   Specialty: Cardiology Contact information: [address] [place] [address] [phone]         Thompson Grayer, MD .   Specialty: Cardiology Contact information: [address] [place] [address] [phone]         [place] Follow up on 10/11/2021.   Specialty: Cardiology Why: Advanced Heart Failure Clinic at [facility] 2:30 pm Entrance C, Garage Code 4315 Contact information: [address] [id number] [place] [phone]                 The results of significant diagnostics from this hospitalization (including imaging, microbiology, ancillary and laboratory) are listed below for reference.    Significant Diagnostic Studies: DG Chest 2 View  Result Date: 09/30/2021 CLINICAL DATA:  Shortness of breath EXAM: CHEST - 2 VIEW COMPARISON:  Chest x-ray 08/30/2021  FINDINGS: Heart size is normal. Mediastinum appears stable. Cardiac surgical changes and median sternotomy wires. Left-sided cardiac pacemaker device. Pulmonary vasculature is within normal limits. Stable appearing elevated left hemidiaphragm and adjacent opacities in the left lower lung zone which may represent scarring/atelectasis. Stable small right pleural effusion. Right apical pleural thickening. No pneumothorax visualized. IMPRESSION: No change since previous study. Small right pleural effusion, elevated left hemidiaphragm and adjacent likely scarring/atelectatic changes in the left lower lung zone. Electronically Signed   By: Ofilia Neas M.D.   On: 09/30/2021 10:36   Myocardial Perfusion Imaging  Result Date: 09/28/2021   Findings are consistent with prior myocardial infarction. The study is high risk.   No ST deviation was noted.   LV perfusion is abnormal. There is no evidence of ischemia. There is evidence of infarction. Defect 1: There is a medium defect with moderate reduction in uptake present in the apical to basal inferior location(s) that is fixed. There is abnormal wall motion in the defect area. Consistent with infarction.   Left ventricular function is abnormal. Global function is severely reduced. Nuclear stress EF: 13 %. The left ventricular ejection fraction is severely decreased (<30%). End diastolic cavity size is severely enlarged. End systolic cavity size is severely enlarged. Severe LVE with global hypokinesis High risk due to EF 13% Inferior wall infarct moderate apex to base no ischemia   [place] EKG SITE RITE  Result Date: 10/01/2021 If Site Rite image not attached, placement could not be confirmed due to current cardiac rhythm.   Microbiology: Recent Results (from the past 240 hour(s))  Resp Panel by RT-PCR (Flu A&B, Covid) Nasopharyngeal Swab     Status: None   Collection Time: 09/30/21 10:11 AM   Specimen: Nasopharyngeal Swab; Nasopharyngeal(NP) swabs in vial  transport medium  Result Value Ref Range Status   SARS Coronavirus 2 by RT PCR NEGATIVE NEGATIVE Final    Comment: (NOTE) SARS-CoV-2 target nucleic acids are NOT DETECTED.  The SARS-CoV-2 RNA is generally detectable in upper respiratory specimens during the acute phase of infection. The lowest concentration of SARS-CoV-2 viral copies this assay can detect is 138 copies/mL. A negative result does not preclude SARS-Cov-2 infection and should not be used as the sole basis for treatment or other patient management decisions. A negative result may occur with  improper specimen collection/handling, submission of specimen other than nasopharyngeal swab, presence of viral mutation(s) within the areas targeted by this assay, and inadequate number of viral copies(<138 copies/mL). A negative result must be combined with clinical observations, patient history, and epidemiological information. The expected result is Negative.  Fact Sheet for Patients:  EntrepreneurPulse.com.au  Fact Sheet for Healthcare Providers:  IncredibleEmployment.be  This test is no t yet approved or cleared by the [place] FDA and  has been authorized for detection and/or diagnosis of SARS-CoV-2 by FDA under an Emergency Use Authorization (EUA). This EUA will remain  in effect (meaning this test can be used) for the duration of the COVID-19 declaration under Section 564(b)(1) of the Act, 21 U.S.C.section 360bbb-3(b)(1), unless the authorization is terminated  or revoked sooner.       Influenza A by PCR NEGATIVE NEGATIVE Final   Influenza B by PCR NEGATIVE NEGATIVE Final    Comment: (NOTE) The Xpert Xpress SARS-CoV-2/FLU/RSV plus assay is intended as an aid in the diagnosis of influenza from Nasopharyngeal swab specimens and should not be used as a sole basis for treatment. Nasal washings and aspirates are unacceptable for Xpert Xpress SARS-CoV-2/FLU/RSV testing.  Fact  Sheet for Patients: EntrepreneurPulse.com.au  Fact Sheet for Healthcare Providers: IncredibleEmployment.be  This test is not yet approved or cleared by the [place] FDA and has been authorized for detection and/or diagnosis of SARS-CoV-2 by FDA under an Emergency Use Authorization (EUA). This EUA will remain in effect (meaning this test can be used) for the duration of the COVID-19 declaration under Section 564(b)(1) of the Act, 21 U.S.C. section 360bbb-3(b)(1), unless the authorization is terminated or revoked.  Performed at [facility], [place] [address], [place], [place] [address]      Labs: Basic Metabolic Panel: Recent Labs  Lab 10/01/21 0405 10/02/21 0450 10/03/21 0407 10/04/21 0435 10/05/21 0440  NA 138 140 141 138 136  K 4.1 3.0* 3.7 4.1 4.1  CL 105 104 103 100 102  CO2 21* 26 28 27 25   GLUCOSE 206* 125* 118* 275* 200*  BUN 30* 28* 25* 34* 30*  CREATININE 1.65* 1.41* 1.35* 1.59* 1.52*  CALCIUM 9.1 8.4* 8.5* 8.9 9.0   Liver Function Tests: Recent Labs  Lab 09/30/21 1014  AST 26  ALT 27  ALKPHOS 75  BILITOT 0.5  PROT 7.2  ALBUMIN 3.5   No results for input(s): LIPASE, AMYLASE in the last 168 hours. No results for input(s): AMMONIA in the last 168 hours. CBC: Recent Labs  Lab 09/30/21 1014 10/02/21 0450 10/03/21 0407  WBC 4.7 5.7 4.1  NEUTROABS 3.6  --   --   HGB 13.1 12.4* 13.0  HCT 41.1 36.4* 38.7*  MCV 74.6* 70.8* 71.8*  PLT 312 252 278   Cardiac Enzymes: No results for input(s): CKTOTAL, CKMB, CKMBINDEX, TROPONINI in the last 168 hours. BNP: BNP (last 3 results) Recent Labs    08/30/21 0832 08/30/21 1215 09/30/21 1014  BNP 1,931.7* 1,424.1* 1,964.7*    ProBNP (last 3 results) No results for input(s): PROBNP in the last 8760 hours.  CBG: Recent Labs  Lab 10/04/21 1130 10/04/21 1614 10/04/21 2009 10/05/21 0545 10/05/21 1122  GLUCAP 195* 331* 369* 209* 163*        Signed:  Domenic Polite MD.  [facility] 10/05/2021, 12:39 PM

## 2021-10-05 NOTE — TOC Benefit Eligibility Note (Signed)
Patient Teacher, English as a foreign language completed.    The patient is currently admitted and upon discharge could be taking Farxiga 10 mg.  The current 30 day co-pay is, $350.63 due to a $305.63 deductible remaining.  Once deductible is met will be a $45.00 copay.   The patient is currently admitted and upon discharge could be taking Jardiance 10 mg.  The current 30 day co-pay is, $350.63 due to a $305.63 deductible remaining.  Once deductible is met will be a $45.00 copay.   The patient is insured through [place], [place] Patient Advocate Specialist [place] Patient Advocate Team Direct Number: [phone]  Fax: [phone]

## 2021-10-05 NOTE — Progress Notes (Signed)
Mobility Specialist Progress Note:   10/05/21 1400  Mobility  Activity Ambulated with assistance in hallway  Level of Assistance Independent  Assistive Device None  Distance Ambulated (ft) 470 ft  Activity Response Tolerated well  $Mobility charge 1 Mobility   Pt asx during ambulation. Back in bed, eager for d/c.  [facility] Numbers Acute Rehab Phone: [phone] Office Phone: [phone]

## 2021-10-05 NOTE — Telephone Encounter (Signed)
Spoke to [name] and introduced myself as part of the paramedicine team with the HF clinic and he requested I call back on Thursday afternoon to set up a home visit for next week.   He explained his work schedule would be difficult to manage but I informed him I am available as early as 0830 in the mornings to report to his home. He understood and wishes I call him back on Thursday. Call complete.

## 2021-10-05 NOTE — Discharge Instructions (Signed)
Patient was prescribed new medications or changes were made to his medications. He was anxious about this and Dr. Broadus Jahlani spoke with over the telephone and answered his questions.

## 2021-10-05 NOTE — Progress Notes (Signed)
Patient left alert and oriented x 4 with no complaints of pain nor discomfort. Dr. Broadus Giancarlos discussed all new medications with him and he received them via the hospital pharmacy upon discharge.  Education and plan of care completed. Pt discharged with all of his belongings and PICC line was removed by Kristin Bruins RN of IV team one hour prior to discharging.

## 2021-10-05 NOTE — Progress Notes (Signed)
CARDIAC REHAB PHASE I   PRE:  Rate/Rhythm: 107 first deg    BP: sitting 91/77    SaO2: 97 RA  MODE:  Ambulation: 750 ft   POST:  Rate/Rhythm: 113 ST first deg    BP: sitting 107/75     SaO2: 99 RA  Pt without c/o walking. Feels well. Discussed HF booklet emphasizing daily wts, signs of fluid, and low sodium diet. Pt does not know how to cook but sts he can grill out. Encouraged choosing this and frozen bags of vegetables. Also discussed better options for sodium eating out as its evident pt cannot change completely. It appears his priority is to continue to work full time. Not interested in CRPII at this time (did in the past). [place], ACSM 10/05/2021 9:21 AM

## 2021-10-06 ENCOUNTER — Encounter (HOSPITAL_COMMUNITY): Payer: Medicare Other | Admitting: Internal Medicine

## 2021-10-06 NOTE — Progress Notes (Incomplete)
ADVANCED HF CLINIC CONSULT NOTE  Primary Care: [name], MD Primary Cardiologist: Dr. Gwenlyn Found HF Cardiologist: Dr. Haroldine Laws  HPI: [name] is a 71 y.o. with a history of squamous cell lung cancer LLL 1985, CAD, CABG x2 2013 , aortic stenosis, TAVR 2019, LBBB, St Jude CRT-D, HTN, OSA, and chronic HFrEF 2019.    In the past he was on corlanor but stopped it due to cost.    Had RHC/LHC with Dr Haroldine Laws for TAVR work up.    Admitted 08/30/21 with A/C HFrEF. Echo completed EF remains < 20%. Diuresed with IV lasix and transitioned to lasix 40 mg twice a day. Intolerant entresto due to hypotentsion.GDMT with losartan, toprol xl, and spiro. Discharged 09/03/21 with CPAP machine but doesn't know how to use it. Discharge weight 152 pounds.    Overall feeling fine. Says he feels shaky when he is walking. Remains SOB with exertion but says its better. Denies PND/Orthopnea. Using CPAP but he does not what settings to use.  He doesn't know how to use the machine. Appetite ok. No fever or chills. Weight at home 152 pounds. Taking all medications except aspirin. Recently separated from his wife. Lives alone. Works full time in [place] at [employer].    Cardiac Testing  Echo 08/2021 EF < 20%  Echo 2021 EF < 20%  Echo 2020 EF 30%   LHC/RHC  2019   Ost 1st Diag to 1st Diag lesion is 50% stenosed. Prox LAD to Mid LAD lesion is 40% stenosed. Ost RCA lesion is 100% stenosed. SVG and is normal in caliber and large. SVG and is large. Ost Cx to Prox Cx lesion is 100% stenosed. Ost LAD to Prox LAD lesion is 40% stenosed.  Ao =109/68 (86) LV = 117/29 RA = 19 RV = 54/17 PA = 62/21 (42) PCW = 40 Fick cardiac output/index = 3.5/1.78 PVR = 0.6 WU FA sat = 96% PA sat = 58%, 61% PAPi = 2.2 RA/PCWP = 0.475 CPO = 0.67 Av gradient: peak 15.0 mmHG  Mean 14.68mmHG  AVA = 0.89 cm2   Review of Systems: [y] = yes, [ ]  = no   General: Weight gain [ ] ; Weight loss [ ] ; Anorexia [ ] ; Fatigue [ ] ; Fever [ ] ;  Chills [ ] ; Weakness [ ]   Cardiac: Chest pain/pressure [ ] ; Resting SOB [ ] ; Exertional SOB [ ] ; Orthopnea [ ] ; Pedal Edema [ ] ; Palpitations [ ] ; Syncope [ ] ; Presyncope [ ] ; Paroxysmal nocturnal dyspnea[ ]   Pulmonary: Cough [ ] ; Wheezing[ ] ; Hemoptysis[ ] ; Sputum [ ] ; Snoring [ ]   GI: Vomiting[ ] ; Dysphagia[ ] ; Melena[ ] ; Hematochezia [ ] ; Heartburn[ ] ; Abdominal pain [ ] ; Constipation [ ] ; Diarrhea [ ] ; BRBPR [ ]   GU: Hematuria[ ] ; Dysuria [ ] ; Nocturia[ ]   Vascular: Pain in legs with walking [ ] ; Pain in feet with lying flat [ ] ; Non-healing sores [ ] ; Stroke [ ] ; TIA [ ] ; Slurred speech [ ] ;  Neuro: Headaches[ ] ; Vertigo[ ] ; Seizures[ ] ; Paresthesias[ ] ;Blurred vision [ ] ; Diplopia [ ] ; Vision changes [ ]   Ortho/Skin: Arthritis [ ] ; Joint pain [ ] ; Muscle pain [ ] ; Joint swelling [ ] ; Back Pain [ ] ; Rash [ ]   Psych: Depression[ ] ; Anxiety[ ]   Heme: Bleeding problems [ ] ; Clotting disorders [ ] ; Anemia [ ]   Endocrine: Diabetes [ ] ; Thyroid dysfunction[ ]    Past Medical History:  Diagnosis Date   AICD (automatic cardioverter/defibrillator) present 02/11/2020   Anemia  Arthritis    Atrial tachycardia (HCC)    Chronic systolic CHF (congestive heart failure) ([place]) 07/04/2018   CKD (chronic kidney disease), stage III (HCC)    Coronary artery disease 02/2012   a. s/p stenting in 2013  b. s/p CABGx2V (SVG--> PDA, SVG--> LCx).   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  Intolerant to PAP therapy   S/P TAVR (transcatheter aortic valve replacement) 05/15/2018   29 mm Edwards Sapien 3 transcatheter heart valve placed via percutaneous right transfemoral approach    Severe aortic stenosis     Current Outpatient Medications  Medication Sig Dispense Refill   amitriptyline (ELAVIL) 25 MG tablet Take 25 mg by mouth at bedtime.      aspirin EC 81 MG EC tablet Take 1 tablet (81 mg total) by mouth daily. Swallow whole. 30 tablet 11   B Complex-C (SUPER B COMPLEX PO) Take 1 tablet by mouth daily.     B-D ULTRAFINE III SHORT PEN 31G X 8 MM MISC 1 each by Other route daily as needed (GLUCOSE TESTING (INSULINE NEEDLE)).      dapagliflozin propanediol (FARXIGA) 10 MG TABS tablet Take 1 tablet (10 mg total) by mouth daily. 30 tablet 0   digoxin (LANOXIN) 0.125 MG tablet Take 1 tablet (0.125 mg total) by mouth daily. 30 tablet 0   insulin detemir (LEVEMIR) 100 UNIT/ML injection Inject 10-18 Units into the skin See admin instructions. 18 units in the morning, 10 units at bedtime     insulin lispro (HUMALOG) 100 UNIT/ML KwikPen Inject 2-3 Units into the skin See admin instructions. Per sliding scale at Breakfast and Dinner     levothyroxine (SYNTHROID, LEVOTHROID) 50 MCG tablet Take 50 mcg by mouth daily before breakfast.      losartan (COZAAR) 25 MG tablet Take 0.5 tablets (12.5 mg total) by mouth daily.     metFORMIN (GLUCOPHAGE) 500 MG tablet Take 2 tablets (1,000 mg total) by mouth 2 (two) times daily with a meal.     metoprolol succinate (TOPROL-XL) 100 MG 24 hr tablet Take 0.5 tablets (50 mg total) by mouth daily. Take with or immediately following a meal.     Multiple Vitamin (MULTIVITAMIN) tablet Take 1 tablet by mouth daily.     Omega-3 Fatty Acids (FISH OIL PO) Take 1 tablet by mouth daily. (Patient not taking: Reported on 09/30/2021)     ONE TOUCH ULTRA TEST test strip 1 each by Other route as needed for other.      potassium chloride  SA (KLOR-CON M) 20 MEQ tablet Take 1 tablet (20 mEq total) by mouth daily. 30 tablet 0   simvastatin (ZOCOR) 20 MG tablet Take 20 mg by mouth every evening.      spironolactone (ALDACTONE) 25 MG tablet Take 1 tablet (25 mg total) by mouth daily. 30 tablet 0   torsemide (DEMADEX) 20  MG tablet Take 2 tablets (40 mg total) by mouth daily. 60 tablet 0   zolpidem (AMBIEN) 5 MG tablet Take 2.5 mg by mouth at bedtime.     No current facility-administered medications for this visit.    Allergies  Allergen Reactions   Atorvastatin Other (See Comments)    Muscle pain Other reaction(s): cramps   Entresto [Sacubitril-Valsartan] Other (See Comments)    hypotension   Adhesive [Tape] Rash      Social History   Socioeconomic History   Marital status: Married    Spouse name: Not on file   Number of children: Not on file   Years of education: Not on file   Highest education level: Not on file  Occupational History   Occupation: security guard    Comment: [employer]  Tobacco Use   Smoking status: Former    Types: Cigarettes    Quit date: 03/06/1985    Years since quitting: 36.6   Smokeless tobacco: Never  Vaping Use   Vaping Use: Never used  Substance and Sexual Activity   Alcohol use: No   Drug use: No   Sexual activity: Not Currently  Other Topics Concern   Not on file  Social History Narrative   Not on file   Social Determinants of Health   Financial Resource Strain: Low Risk    Difficulty of Paying Living Expenses: Not very hard  Food Insecurity: No Food Insecurity   Worried About Running Out of Food in the Last Year: Never true   Ran Out of Food in the Last Year: Never true  Transportation Needs: No Transportation Needs   Lack of Transportation (Medical): No   Lack of Transportation (Non-Medical): No  Physical Activity: Not on file  Stress: Not on file  Social Connections: Not on file  Intimate Partner Violence: Not on file      Family History  Problem Relation Age of  Onset   Diabetes Father    Diabetes Mother    Heart failure Mother    Hypertension Mother    Cancer Brother    Diabetes Sister     There were no vitals filed for this visit.  PHYSICAL EXAM: General:  Well appearing. No respiratory difficulty HEENT: normal Neck: supple. no JVD. Carotids 2+ bilat; no bruits. No lymphadenopathy or thryomegaly appreciated. Cor: PMI nondisplaced. Regular rate & rhythm. No rubs, gallops or murmurs. Lungs: clear Abdomen: soft, nontender, nondistended. No hepatosplenomegaly. No bruits or masses. Good bowel sounds. Extremities: no cyanosis, clubbing, rash, edema Neuro: alert & oriented x 3, cranial nerves grossly intact. moves all 4 extremities w/o difficulty. Affect pleasant.  ECG:   ASSESSMENT & PLAN: Chronic HFrEF  Has St Jude CRT-D. Impedance up. Not suggestive of fluid accumulation.  Had PYP 2020 not suggestive of amyloid.  NYHA III.  Volume status stable. Continue lasix 40 mg twice a day  - Continue Toprol XL 100 mg daily . Add digoxin 0.125 mg daily.  - Heart in the 90-100s. In the past he was on corlanor however he cant afford it.  - next visit will check TSH and dig level.  - Failed entresto in the past due to hypotension. Continue losaratn 25 mg daily . MRA - Continue jardiance 10 mg daily  - Check BMET today  - Discussed CPX test however he wants to hold off.  - He does have myoview set up next month. If abnormal would recommend RHC/LHC to assess hemodynamics.    2. CAD H/O CABG 2013 Had  myoview  ordered.  - No chest pain.    3. LBBB  QRS 150 ms on EKG today.    4. DMII Hgb A1C 8.3  On farxiga   5. OSA - started on CPAP during his recent hospitalization - Discharged with CPAP machine but he was not instructed how to use it.    6. AS, S/P TAVP 2019 Edwards SAPIEN valve  -Valve ok on recent ECHO   7. Hypothyroid TSH 5.6 09/06/2019 On levothyroxine. Needs TSH checked     Referred to HFSW ([place]  ): Yes  Refer to  Pharmacy: Yes Refer to Home Health: Yes  Refer to Advanced Heart Failure Clinic: Yes ---> Dr Haroldine Laws  Refer to General Cardiology: Shared.    Follow up  in 3 weeks with Dr Haroldine Laws. Needs dig level and TSH

## 2021-10-07 ENCOUNTER — Telehealth (HOSPITAL_COMMUNITY): Payer: Self-pay

## 2021-10-07 NOTE — Telephone Encounter (Signed)
Spoke to [name] who seemed skeptical about meeting before work and understanding my schedule being M-Th 0800-1800. He works M-F 1000-1800. I assured him we would be as quick and efficient as possible. He agreed to initial paramedicine visit on Monday at 0830. Call complete.

## 2021-10-11 ENCOUNTER — Other Ambulatory Visit (HOSPITAL_COMMUNITY): Payer: Self-pay

## 2021-10-11 ENCOUNTER — Encounter (HOSPITAL_COMMUNITY): Payer: Medicare Other

## 2021-10-11 ENCOUNTER — Other Ambulatory Visit (HOSPITAL_COMMUNITY): Payer: Self-pay | Admitting: *Deleted

## 2021-10-11 DIAGNOSIS — N1831 Chronic kidney disease, stage 3a: Secondary | ICD-10-CM | POA: Diagnosis not present

## 2021-10-11 DIAGNOSIS — I10 Essential (primary) hypertension: Secondary | ICD-10-CM | POA: Diagnosis not present

## 2021-10-11 DIAGNOSIS — E1122 Type 2 diabetes mellitus with diabetic chronic kidney disease: Secondary | ICD-10-CM | POA: Diagnosis not present

## 2021-10-11 DIAGNOSIS — I2510 Atherosclerotic heart disease of native coronary artery without angina pectoris: Secondary | ICD-10-CM | POA: Diagnosis not present

## 2021-10-11 DIAGNOSIS — I509 Heart failure, unspecified: Secondary | ICD-10-CM | POA: Diagnosis not present

## 2021-10-11 DIAGNOSIS — Z0001 Encounter for general adult medical examination with abnormal findings: Secondary | ICD-10-CM | POA: Diagnosis not present

## 2021-10-11 DIAGNOSIS — Z79899 Other long term (current) drug therapy: Secondary | ICD-10-CM | POA: Diagnosis not present

## 2021-10-11 DIAGNOSIS — E7800 Pure hypercholesterolemia, unspecified: Secondary | ICD-10-CM | POA: Diagnosis not present

## 2021-10-11 DIAGNOSIS — Z952 Presence of prosthetic heart valve: Secondary | ICD-10-CM | POA: Diagnosis not present

## 2021-10-11 MED ORDER — DAPAGLIFLOZIN PROPANEDIOL 10 MG PO TABS: 10.0000 mg | ORAL_TABLET | Freq: Every day | ORAL | 0 refills | Status: DC

## 2021-10-11 NOTE — Progress Notes (Signed)
Paramedicine Encounter    Patient ID: [name], male    DOB: 09-30-1950, 71 y.o.   MRN: [id number]  Arrived for initial paramedicine visit for [name] who reports feeling good with no complaints of shortness of breath, dizziness, chest pain or trouble taking his medications.   I obtained vitals and assessment. No edema or swelling noted. Lung sounds clear. He reports using two pillows at night with no shortness of breath while sleeping for now.   I reviewed all medications in which he has except his Wilder Glade, in which he will be picking up from [facility] this week.   We reviewed possibility of using a pill box and he stated he did not want to use a pill box, he wanted to continue taking them from his bottles. He has a good understanding of what meds he takes and when.   We reviewed upcoming appointments and confirmed same. He agreed to [place] meeting at his HF clinic visit next week instead of in the home as he works M-F 1000-1800 and his mornings are limited per patient.   We discussed social barriers. He currently owns his home, he works at [employer] M-F 1000-1800. He is not having trouble getting food or paying his bills right now but states that could become an issue because him and his wife are in the middle of a separation. I will make sure Bill Lofts LCSW is aware and we continue to assist as we can.   Home visit complete. I will see [name] next week in clinic.   Refills: Wilder Glade (I will have HF team send to [facility])    Patient Care Team: [name], MD as PCP - General (Family Medicine) [name], MD as PCP - Cardiology (Cardiology) Thompson Grayer, MD as PCP - Electrophysiology (Cardiology)  Patient Active Problem List   Diagnosis Date Noted   Hypothyroidism 09/30/2021   Acute renal failure superimposed on stage 3a chronic kidney disease ([place]) 09/30/2021   Elevated troponin 09/30/2021   Normal anion gap metabolic acidosis 61/44/3154   Acute on chronic systolic (congestive) heart  failure ([place]) 09/30/2021   Acute on chronic systolic CHF (congestive heart failure) ([place]) 08/30/2021   Ischemic cardiomyopathy 02/11/2020   Left bundle branch block 01/08/2020   Squamous cell carcinoma of bronchus in left lower lobe ([place]) 11/22/2018   Nodule of lower lobe of left lung 10/27/2018   OSA (obstructive sleep apnea) 00/86/7619   Chronic systolic CHF (congestive heart failure) ([place]) 07/04/2018   S/P TAVR (transcatheter aortic valve replacement) 05/15/2018   History of CVA (cerebrovascular accident)    Cardiomyopathy ([place]) 11/11/2016   Aortic stenosis, severe 06/27/2014   AAA (abdominal aortic aneurysm)    Insomnia    Hypertension    SOB (shortness of breath)    Insulin dependent type 2 diabetes mellitus ([place]) 12/28/2012   Dyslipidemia 12/28/2012   CAD (coronary artery disease), native coronary artery 02/13/2012   Hx of radiation therapy to mediastinum 08/16/1983   Malignant seminoma of mediastinum ([place]) 08/16/1983    Current Outpatient Medications:    amitriptyline (ELAVIL) 25 MG tablet, Take 25 mg by mouth at bedtime. , Disp: , Rfl:    aspirin EC 81 MG EC tablet, Take 1 tablet (81 mg total) by mouth daily. Swallow whole., Disp: 30 tablet, Rfl: 11   B Complex-C (SUPER B COMPLEX PO), Take 1 tablet by mouth daily., Disp: , Rfl:    B-D ULTRAFINE III SHORT PEN 31G X 8 MM MISC, 1 each by Other route  daily as needed (GLUCOSE TESTING (INSULINE NEEDLE)). , Disp: , Rfl:    dapagliflozin propanediol (FARXIGA) 10 MG TABS tablet, Take 1 tablet (10 mg total) by mouth daily., Disp: 30 tablet, Rfl: 0   digoxin (LANOXIN) 0.125 MG tablet, Take 1 tablet (0.125 mg total) by mouth daily., Disp: 30 tablet, Rfl: 0   insulin detemir (LEVEMIR) 100 UNIT/ML injection, Inject 10-18 Units into the skin See admin instructions. 18 units in the morning, 10 units at bedtime, Disp: , Rfl:    insulin lispro (HUMALOG) 100 UNIT/ML KwikPen, Inject 2-3 Units into the skin See admin instructions. Per sliding scale  at Breakfast and Dinner, Disp: , Rfl:    levothyroxine (SYNTHROID, LEVOTHROID) 50 MCG tablet, Take 50 mcg by mouth daily before breakfast. , Disp: , Rfl:    losartan (COZAAR) 25 MG tablet, Take 0.5 tablets (12.5 mg total) by mouth daily., Disp: , Rfl:    metFORMIN (GLUCOPHAGE) 500 MG tablet, Take 2 tablets (1,000 mg total) by mouth 2 (two) times daily with a meal., Disp: , Rfl:    metoprolol succinate (TOPROL-XL) 100 MG 24 hr tablet, Take 0.5 tablets (50 mg total) by mouth daily. Take with or immediately following a meal., Disp: , Rfl:    Multiple Vitamin (MULTIVITAMIN) tablet, Take 1 tablet by mouth daily., Disp: , Rfl:    Omega-3 Fatty Acids (FISH OIL PO), Take 1 tablet by mouth daily. (Patient not taking: Reported on 09/30/2021), Disp: , Rfl:    ONE TOUCH ULTRA TEST test strip, 1 each by Other route as needed for other. , Disp: , Rfl:    potassium chloride SA (KLOR-CON M) 20 MEQ tablet, Take 1 tablet (20 mEq total) by mouth daily., Disp: 30 tablet, Rfl: 0   simvastatin (ZOCOR) 20 MG tablet, Take 20 mg by mouth every evening. , Disp: , Rfl:    spironolactone (ALDACTONE) 25 MG tablet, Take 1 tablet (25 mg total) by mouth daily., Disp: 30 tablet, Rfl: 0   torsemide (DEMADEX) 20 MG tablet, Take 2 tablets (40 mg total) by mouth daily., Disp: 60 tablet, Rfl: 0   zolpidem (AMBIEN) 5 MG tablet, Take 2.5 mg by mouth at bedtime., Disp: , Rfl:  Allergies  Allergen Reactions   Atorvastatin Other (See Comments)    Muscle pain Other reaction(s): cramps   Entresto [Sacubitril-Valsartan] Other (See Comments)    hypotension   Adhesive [Tape] Rash     Social History   Socioeconomic History   Marital status: Married    Spouse name: Not on file   Number of children: Not on file   Years of education: Not on file   Highest education level: Not on file  Occupational History   Occupation: security guard    Comment: [employer]  Tobacco Use   Smoking status: Former    Types: Cigarettes    Quit date:  03/06/1985    Years since quitting: 36.6   Smokeless tobacco: Never  Vaping Use   Vaping Use: Never used  Substance and Sexual Activity   Alcohol use: No   Drug use: No   Sexual activity: Not Currently  Other Topics Concern   Not on file  Social History Narrative   Not on file   Social Determinants of Health   Financial Resource Strain: Low Risk    Difficulty of Paying Living Expenses: Not very hard  Food Insecurity: No Food Insecurity   Worried About Running Out of Food in the Last Year: Never true   Ran Out of  Food in the Last Year: Never true  Transportation Needs: No Transportation Needs   Lack of Transportation (Medical): No   Lack of Transportation (Non-Medical): No  Physical Activity: Not on file  Stress: Not on file  Social Connections: Not on file  Intimate Partner Violence: Not on file    Physical Exam Vitals reviewed.  Constitutional:      Appearance: Normal appearance. He is normal weight.  HENT:     Head: Normocephalic.     Nose: Nose normal.     Mouth/Throat:     Mouth: Mucous membranes are moist.     Pharynx: Oropharynx is clear.  Eyes:     Conjunctiva/sclera: Conjunctivae normal.     Pupils: Pupils are equal, round, and reactive to light.  Cardiovascular:     Rate and Rhythm: Normal rate and regular rhythm.     Pulses: Normal pulses.     Heart sounds: Normal heart sounds.  Pulmonary:     Effort: Pulmonary effort is normal.     Breath sounds: Normal breath sounds.  Abdominal:     General: Abdomen is flat.     Palpations: Abdomen is soft.  Musculoskeletal:        General: No swelling. Normal range of motion.     Cervical back: Normal range of motion.     Right lower leg: No edema.     Left lower leg: No edema.  Skin:    General: Skin is warm and dry.     Capillary Refill: Capillary refill takes less than 2 seconds.  Neurological:     General: No focal deficit present.     Mental Status: He is alert. Mental status is at baseline.   Psychiatric:        Mood and Affect: Mood normal.        Future Appointments  Date Time Provider [place]  10/18/2021  7:05 AM [facility] DEVICE REMOTES [facility]  10/21/2021  8:30 AM [facility] PA/NP [facility] None  11/08/2021 11:00 AM Gery Pray, MD [facility] None  11/10/2021  7:10 AM [facility] DEVICE REMOTES [facility]  02/09/2022  7:10 AM [facility] DEVICE REMOTES [facility]  05/11/2022  7:10 AM [facility] DEVICE REMOTES [facility]  08/10/2022  7:10 AM [facility] DEVICE REMOTES [facility]     ACTION: Home visit completed

## 2021-10-12 ENCOUNTER — Telehealth: Payer: Self-pay | Admitting: *Deleted

## 2021-10-12 NOTE — Telephone Encounter (Signed)
CALLED PATIENT TO INFORM OF CT FOR 11-03-21- ARRIVAL TIME- 12:15 PM @ [facility], NO RESTRICTIONS TO TEST, PATIENT TO RECEIVE RESULTS FROM DR. KINARD ON 11-08-21 @ 11 AM, SPOKE WITH PATIENT AND HE IS AWARE OF THESE APPTS.

## 2021-10-18 ENCOUNTER — Ambulatory Visit (INDEPENDENT_AMBULATORY_CARE_PROVIDER_SITE_OTHER): Payer: Medicare Other

## 2021-10-18 DIAGNOSIS — I509 Heart failure, unspecified: Secondary | ICD-10-CM | POA: Diagnosis not present

## 2021-10-18 DIAGNOSIS — Z95810 Presence of automatic (implantable) cardiac defibrillator: Secondary | ICD-10-CM | POA: Diagnosis not present

## 2021-10-20 ENCOUNTER — Telehealth (HOSPITAL_COMMUNITY): Payer: Self-pay

## 2021-10-20 NOTE — Progress Notes (Signed)
EPIC Encounter for ICM Monitoring ? ?Patient Name: [name] is a 71 y.o. male ?Date: 10/20/2021 ?Primary Care Physican: [name], MD ?Primary Cardiologist: [facility] ?Electrophysiologist: [place] ?10/20/2021 Weight: 140 lbs ?                                                         ?  ?Spoke with patient and heart failure questions reviewed.  Pt feeling much better since fluid was removed during recent hospitalization.   Hospitalization 2/16-2/21.  Discussed diet and he has been eating a lot of frozen dinners and restaurant foods.  Advised how that effects his heart and causes fluid retention.  ?  ?CorVue thoracic impedance suggesting possible dryness following 2/21 hospital discharge until returning to normal 3/4 ?  ?Prescribed: ?Furosemide 20 mg take 2 tablets (40 mg total) daily.  ?Potassium 20 mEq take 1 tablet by mouth daily.  ?Spironolactone 25 mg take 1 tablet by mouth daily. ?Farxiga 10 mg take 1 tablet by mouth daily. ?  ?Labs: ?10/05/2021 Creatinine 1.52, BUN 30, Potassium 4.1, Sodium 136, GFR 48 ?10/04/2021 Creatinine 1.59, BUN 34, Potassium 4.1, Sodium 138, GFR 46  ?10/03/2021 Creatinine 1.35, BUN 25, Potassium 3.7, Sodium 141, GFR 56  ?10/02/2021 Creatinine 1.41, BUN 28, Potassium 3.0, Sodium 140, GFR 53  ?10/01/2021 Creatinine 1.65, BUN 30, Potassium 4.1, Sodium 138, GFR 44  ?09/30/2021 Creatinine 1.66, BUN 29, Potassium 3.8, Sodium 138, GFR 44  ?A complete set of results can be found in Results Review. ?  ?Recommendations:  Discussed changing diet and encouraged to restrict salt intake and foods that are high in salt.  Pt has returned to work.  Has HF clinic OV tomorrow, 3/9. ?  ?Follow-up plan: ICM clinic phone appointment on 11/01/2021 to recheck fluid levels.   91 day device clinic remote transmission 11/10/2021.   ?  ?EP/Cardiology Office Visits:  10/21/2021 with [facility].   Recall 05/14/2021 with Dr Rayann Heman.   ?  ?Copy of ICM check sent to Dr. Rayann Heman.  ? ?3 month ICM trend:  10/18/2021. ? ? ? ?12-14 Month ICM trend:  ? ? ? ?[name], RN ?10/20/2021 ?3:36 PM ? ?

## 2021-10-20 NOTE — Progress Notes (Signed)
PCP: Dr Radford Pax Primary Cardiologist: Dr Vaughan Browner   HPI: [name] is a 71 year old with a history of squamous cell lung cancer LLL 1985, CAD, CABG x2 2013 , aortic stenosis, TAVR 2019, LBBB, St Jude CRT-D, HTN, OSA, and chronic HFrEF 2019.    Had RHC/LHC with Dr Haroldine Laws for TAVR work up.    Admitted 08/30/21 with A/C HFrEF. Echo completed EF remains < 20%. Diuresed with IV lasix and transitioned to lasix 40 mg twice a day. Intolerant entresto due to hypotentsion.GDMT with losartan, toprol xl, and spiro. Discharged 09/03/21 with CPAP machine but doesn't know how to use it. Discharge weight 152 pounds.   Admitted 09/30/21 with volume overload. PICC placed with stable CO-OX. Diuresed with IV lasix and transitioned to torsemide 40 mg daily. BB cut back. D/C weight 140 pounds.   Overall feeling fine. Had brief SOB at work but once he rested says the dyspnea went away.  Denies PND/Orthopnea. Appetite ok. No fever or chills. Weight at home 140-144 pounds. Taking all medications but has been out of [place] for the last 7 days.  Working full time at [employer]. Followed by HF Paramedicine.   Cardiac Testing  Echo 08/2021 EF <20%  Echo 2021 Ef < 20%  Echo 2020 EF 30%   LHC/RHC  2019   Ost 1st Diag to 1st Diag lesion is 50% stenosed. Prox LAD to Mid LAD lesion is 40% stenosed. Ost RCA lesion is 100% stenosed. SVG and is normal in caliber and large. SVG and is large. Ost Cx to Prox Cx lesion is 100% stenosed. Ost LAD to Prox LAD lesion is 40% stenosed.  Ao =109/68 (86) LV = 117/29 RA = 19 RV = 54/17 PA = 62/21 (42) PCW = 40 Fick cardiac output/index = 3.5/1.78 PVR = 0.6 WU FA sat = 96% PA sat = 58%, 61% PAPi = 2.2 RA/PCWP = 0.475 CPO = 0.67 Av gradient: peak 15.0 mmHG  Mean 14.38mmHG  AVA = 0.89 cm2  ROS: All systems negative except as listed in HPI, PMH and Problem List.  SH:  Social History   Socioeconomic History   Marital status: Married    Spouse name: Not on file   Number of  children: Not on file   Years of education: Not on file   Highest education level: Not on file  Occupational History   Occupation: security guard    Comment: [employer]  Tobacco Use   Smoking status: Former    Types: Cigarettes    Quit date: 03/06/1985    Years since quitting: 36.6   Smokeless tobacco: Never  Vaping Use   Vaping Use: Never used  Substance and Sexual Activity   Alcohol use: No   Drug use: No   Sexual activity: Not Currently  Other Topics Concern   Not on file  Social History Narrative   Not on file   Social Determinants of Health   Financial Resource Strain: Low Risk    Difficulty of Paying Living Expenses: Not very hard  Food Insecurity: No Food Insecurity   Worried About Running Out of Food in the Last Year: Never true   Ran Out of Food in the Last Year: Never true  Transportation Needs: No Transportation Needs   Lack of Transportation (Medical): No   Lack of Transportation (Non-Medical): No  Physical Activity: Not on file  Stress: Not on file  Social Connections: Not on file  Intimate Partner Violence: Not on file    FH:  Family  History  Problem Relation Age of Onset   Diabetes Father    Diabetes Mother    Heart failure Mother    Hypertension Mother    Cancer Brother    Diabetes Sister     Past Medical History:  Diagnosis Date   AICD (automatic cardioverter/defibrillator) present 02/11/2020   Anemia    Arthritis    Atrial tachycardia (HCC)    Chronic systolic CHF (congestive heart failure) ([place]) 07/04/2018   CKD (chronic kidney disease), stage III ([place])    Coronary artery disease 02/2012   a. s/p stenting in 2013  b. s/p CABGx2V (SVG--> PDA, SVG--> LCx).   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  Intolerant to PAP therapy   S/P TAVR (transcatheter aortic valve replacement) 05/15/2018   29 mm Edwards Sapien 3 transcatheter heart valve placed via percutaneous right transfemoral approach    Severe aortic stenosis     Current Outpatient Medications  Medication Sig Dispense Refill   amitriptyline (ELAVIL) 25 MG tablet Take 25 mg by mouth at bedtime.      aspirin EC 81 MG EC tablet Take 1 tablet (81 mg total) by mouth daily. Swallow whole. 30 tablet 11   B Complex-C (SUPER B COMPLEX PO) Take 1 tablet by mouth daily.     B-D ULTRAFINE III SHORT PEN 31G X 8 MM MISC 1 each by Other route daily as needed (GLUCOSE TESTING (INSULINE NEEDLE)).      dapagliflozin propanediol (FARXIGA) 10 MG TABS tablet Take 1 tablet (10 mg total) by mouth daily. 30 tablet 0   digoxin (LANOXIN) 0.125 MG tablet Take 1 tablet (0.125 mg total) by mouth daily. 30 tablet 0   insulin detemir (LEVEMIR) 100 UNIT/ML injection Inject 10-18 Units into the skin See admin instructions. 18 units in the morning, 10 units at bedtime     insulin lispro (HUMALOG) 100 UNIT/ML KwikPen Inject 2-3 Units into the skin See admin instructions. Per sliding scale at Breakfast and Dinner     levothyroxine (SYNTHROID, LEVOTHROID) 50 MCG tablet Take 50 mcg by mouth daily before breakfast.      losartan (COZAAR) 25 MG tablet Take 0.5 tablets (12.5 mg total) by mouth daily.     metFORMIN (GLUCOPHAGE) 500 MG tablet Take 2 tablets (1,000 mg total) by mouth 2 (two) times daily with a meal.     metoprolol succinate (TOPROL-XL) 100 MG 24 hr tablet Take 0.5 tablets (50 mg total) by mouth daily. Take with or immediately  following a meal.     Multiple Vitamin (MULTIVITAMIN) tablet Take 1 tablet by mouth daily.     ONE TOUCH ULTRA TEST test strip 1 each by Other route as needed for other.      potassium chloride SA (KLOR-CON M) 20 MEQ tablet Take 1 tablet (20 mEq total) by mouth daily. 30 tablet 0   simvastatin (  ZOCOR) 20 MG tablet Take 20 mg by mouth every evening.      spironolactone (ALDACTONE) 25 MG tablet Take 1 tablet (25 mg total) by mouth daily. 30 tablet 0   torsemide (DEMADEX) 20 MG tablet Take 2 tablets (40 mg total) by mouth daily. 60 tablet 0   zolpidem (AMBIEN) 5 MG tablet Take 5 mg by mouth at bedtime as needed for sleep.     No current facility-administered medications for this encounter.    Vitals:   10/21/21 0847  BP: 128/70  Pulse: 90  SpO2: 100%  Weight: 68.1 kg (150 lb 3.2 oz)   Wt Readings from Last 3 Encounters:  10/21/21 68.1 kg (150 lb 3.2 oz)  10/11/21 65.3 kg (144 lb)  10/05/21 63.8 kg (140 lb 10.5 oz)    PHYSICAL EXAM: General:  Walked in the clinic. No resp difficulty HEENT: normal Neck: supple. JVP 7-8  Carotids 2+ bilat; no bruits. No lymphadenopathy or thryomegaly appreciated. Cor: PMI nondisplaced. Regular rate & rhythm. No rubs, gallops or murmurs. Lungs: clear Abdomen: soft, nontender, nondistended. No hepatosplenomegaly. No bruits or masses. Good bowel sounds. Extremities: no cyanosis, clubbing, rash, edema Neuro: alert & orientedx3, cranial nerves grossly intact. moves all 4 extremities w/o difficulty. Affect pleasant   ECG: SR 1st degree heart block.   ASSESSMENT & PLAN: Chronic  HFrEf . - s/p St Jude CRT-D 01/2020. Unable to place LV lead. Interestingly, he has a RBBB this admission and had LBBB on prior ECGs.  Discussed with Dr. Lovena Le, with current RBBB a left bundle lead will not be likely to help.  - Had PYP 2020 not suggestive of amyloid.  - Echo 08/30/21: EF 20-25%, RV significantly reduced, RVSP 50 mmHg, moderate MR, moderate TR, mean gradient 2  mmHg across aortic valve prosthesis - NYHA III. Volume status mildly elevated. Continue torsemide 40 mg daily.  - Reorder farxiga 10 mg daily. Instructed to pick up today.  - Continue Toprol to 50 mg daily  - Continue digoxin 0.125, dig level < 0.2 - Failed entresto in the past due to hypotension. Continue losartan 12.5 mg daily.  - Continue spironolactone 25 mg daily - Corlanor previously stopped d/t cost.  - Check BMET  - Set up CPX test further assess limitations.   2. CAD - H/O CABG 2013 - On asa 81 mg daily - Continue simvastatin 20 mg daily.  - No chest pain.    3. DMII - Insulin dependent - Hgb A1C 8.3 in 01/23 - As above reordering farxiga   4. OSA   5. Aortic valve stenosis - S/P TAVR 2019 with 29 mm S3 - Valve okay on recent echo   7. Hypothyroid - On levothyroxine.    8. Microcytic anemia - Hgb 12-13 - MCV low - Low transferrin saturation, given feraheme.   9. CKD Stage IIIa Creatinine baseline 1.54-1.6  Check BMET today.   Check BMET  Follow up 4 weeks with Dr Haroldine Laws to review and discuss CPX.  Discussed changes with HF Paramedicine.   Jakai Onofre NP-C  10:08 AM

## 2021-10-20 NOTE — Telephone Encounter (Signed)
Attempted to call [name] to remind him of his appointment tomorrow with no success. I will continue to try to reach out and plan to meet him in clinic tomorrow morning.  ?

## 2021-10-21 ENCOUNTER — Telehealth (HOSPITAL_COMMUNITY): Payer: Self-pay

## 2021-10-21 ENCOUNTER — Encounter (HOSPITAL_COMMUNITY): Payer: Self-pay

## 2021-10-21 ENCOUNTER — Other Ambulatory Visit: Payer: Self-pay

## 2021-10-21 ENCOUNTER — Other Ambulatory Visit (HOSPITAL_COMMUNITY): Payer: Self-pay

## 2021-10-21 ENCOUNTER — Ambulatory Visit (HOSPITAL_COMMUNITY)
Admission: RE | Admit: 2021-10-21 | Discharge: 2021-10-21 | Disposition: A | Discharge status: Home / Self Care | Payer: Medicare Other | Source: Ambulatory Visit | Attending: Adult Health | Admitting: Adult Health

## 2021-10-21 VITALS — BP 128/70 | HR 90 | Wt 150.2 lb

## 2021-10-21 DIAGNOSIS — I2510 Atherosclerotic heart disease of native coronary artery without angina pectoris: Secondary | ICD-10-CM | POA: Diagnosis not present

## 2021-10-21 DIAGNOSIS — Z794 Long term (current) use of insulin: Secondary | ICD-10-CM | POA: Diagnosis not present

## 2021-10-21 DIAGNOSIS — G4733 Obstructive sleep apnea (adult) (pediatric): Secondary | ICD-10-CM | POA: Insufficient documentation

## 2021-10-21 DIAGNOSIS — E039 Hypothyroidism, unspecified: Secondary | ICD-10-CM | POA: Insufficient documentation

## 2021-10-21 DIAGNOSIS — I350 Nonrheumatic aortic (valve) stenosis: Secondary | ICD-10-CM | POA: Insufficient documentation

## 2021-10-21 DIAGNOSIS — I959 Hypotension, unspecified: Secondary | ICD-10-CM | POA: Insufficient documentation

## 2021-10-21 DIAGNOSIS — I5022 Chronic systolic (congestive) heart failure: Secondary | ICD-10-CM | POA: Diagnosis not present

## 2021-10-21 DIAGNOSIS — Z7982 Long term (current) use of aspirin: Secondary | ICD-10-CM | POA: Diagnosis not present

## 2021-10-21 DIAGNOSIS — E1122 Type 2 diabetes mellitus with diabetic chronic kidney disease: Secondary | ICD-10-CM | POA: Insufficient documentation

## 2021-10-21 DIAGNOSIS — I452 Bifascicular block: Secondary | ICD-10-CM | POA: Diagnosis not present

## 2021-10-21 DIAGNOSIS — I10 Essential (primary) hypertension: Secondary | ICD-10-CM | POA: Diagnosis not present

## 2021-10-21 DIAGNOSIS — Z952 Presence of prosthetic heart valve: Secondary | ICD-10-CM | POA: Insufficient documentation

## 2021-10-21 DIAGNOSIS — Z79899 Other long term (current) drug therapy: Secondary | ICD-10-CM | POA: Diagnosis not present

## 2021-10-21 DIAGNOSIS — I130 Hypertensive heart and chronic kidney disease with heart failure and stage 1 through stage 4 chronic kidney disease, or unspecified chronic kidney disease: Secondary | ICD-10-CM | POA: Diagnosis not present

## 2021-10-21 DIAGNOSIS — Z79890 Hormone replacement therapy (postmenopausal): Secondary | ICD-10-CM | POA: Diagnosis not present

## 2021-10-21 DIAGNOSIS — D509 Iron deficiency anemia, unspecified: Secondary | ICD-10-CM | POA: Insufficient documentation

## 2021-10-21 DIAGNOSIS — Z951 Presence of aortocoronary bypass graft: Secondary | ICD-10-CM | POA: Insufficient documentation

## 2021-10-21 LAB — BASIC METABOLIC PANEL
Anion gap: 10 (ref 5–15)
BUN: 21 mg/dL (ref 8–23)
CO2: 27 mmol/L (ref 22–32)
Calcium: 9 mg/dL (ref 8.9–10.3)
Chloride: 100 mmol/L (ref 98–111)
Creatinine, Ser: 1.18 mg/dL (ref 0.61–1.24)
GFR, Estimated: >60 mL/min (ref >60)
Glucose, Bld: 228 mg/dL — ABNORMAL HIGH (ref 70–99)
Potassium: 4 mmol/L (ref 3.5–5.1)
Sodium: 137 mmol/L (ref 135–145)

## 2021-10-21 MED ORDER — DAPAGLIFLOZIN PROPANEDIOL 10 MG PO TABS: 10.0000 mg | ORAL_TABLET | Freq: Every day | ORAL | 3 refills | Status: DC

## 2021-10-21 NOTE — Patient Instructions (Signed)
Medication Changes: ? ?No changes with your medications ? ?Lab Work: ? ?Labs done today, your results will be available in MyChart, we will contact you for abnormal readings. ? ? ?Testing/Procedures: ? ?Your physician has recommended that you have a cardiopulmonary stress test (CPX). CPX testing is a non-invasive measurement of heart and lung function. It replaces a traditional treadmill stress test. This type of test provides a tremendous amount of information that relates not only to your present condition but also for future outcomes. This test combines measurements of you ventilation, respiratory gas exchange in the lungs, electrocardiogram (EKG), blood pressure and physical response before, during, and following an exercise protocol. ? ? ?Referrals: ? ?none ? ?Special Instructions // Education: ? ?none ? ?Follow-Up in: 4 weeks  ? ?At the [facility], you and your health needs are our priority. We have a designated team specialized in the treatment of Heart Failure. This Care Team includes your primary Heart Failure Specialized Cardiologist (physician), Advanced Practice Providers (APPs- Physician Assistants and Nurse Practitioners), and Pharmacist who all work together to provide you with the care you need, when you need it.  ? ?You may see any of the following providers on your designated Care Team at your next follow up: ? ?Dr Glori Bickers ?Dr Loralie Champagne ?Darrick Grinder, NP ?Lyda Jester, PA ?Jessica Milford,NP ?Marlyce Huge, PA ?Audry Riles, PharmD ? ? ?Please be sure to bring in all your medications bottles to every appointment.  ? ?Need to Contact us: ? ?If you have any questions or concerns before your next appointment please send [place] a message through [place] or call our office at [phone].   ? ?TO LEAVE A MESSAGE FOR THE NURSE SELECT OPTION 2, PLEASE LEAVE A MESSAGE INCLUDING: ?YOUR NAME ?DATE OF BIRTH ?CALL BACK NUMBER ?REASON FOR CALL**this is important as we prioritize  the call backs ? ?YOU WILL RECEIVE A CALL BACK THE SAME DAY AS LONG AS YOU CALL BEFORE 4:00 PM ? ? ?

## 2021-10-21 NOTE — Progress Notes (Signed)
Paramedicine Encounter ? ? ? Patient ID: [name], male    DOB: 02/07/1951, 70 y.o.   MRN: [id number] ? ? ? ?Met with [name] in clinic today where he was seen by Amy Clegg, PA.  ? ?[name] reports having some mild shortness of breath on exertion but no chest pain or dizziness.  ? ?CPX test set to be ordered.  ? ?Labs today. ? ?Meds confirmed.  ? ?Restart Farxiga as we learned he never picked up same at pharmacy last week as instructed. He verbalized understanding and plans to pick up same today.  ? ?Amy states she wants his goal weight at 140lbs.  ? ?He mentioned sometimes he strains when lifting or pulling on things at work and this causes incontinence for him. I advised him to follow up with his PCP with this.  ? ?I plan to see him in two weeks as I will be out of office next week. He agreed with same.  ? ? ? ? ?ACTION: ?Home visit completed ? ? ? ? ? ? ?

## 2021-10-21 NOTE — Telephone Encounter (Signed)
I spoke to [name] in reference to his diabetic management and he reports he did not take his insulin this morning and he ate prepackaged oatmeal prior to his clinic visit.  ? ?He reports he sees Development worker, community at [facility] for his diabetic management.  ? ?I will work with him to get a scheduled appointment and continue to work on diabetic nutrition education. ? ?He states that his normal sugar readings range between 150 and 200.  ? ?I educated him on the importance of decreasing his amount of carbs and sugar and prepackaged foods. He agreed and said he will try to be more conscious about this.  ? ? ?He also reports he will be picking up [place] today around 630.  ? ? ?I will follow up with him on my return in two weeks. He agreed with plan.  ? ?

## 2021-10-27 ENCOUNTER — Other Ambulatory Visit (HOSPITAL_COMMUNITY): Payer: Self-pay

## 2021-10-27 ENCOUNTER — Telehealth (HOSPITAL_COMMUNITY): Payer: Self-pay

## 2021-10-27 ENCOUNTER — Telehealth (HOSPITAL_COMMUNITY): Payer: Self-pay | Admitting: Pharmacy Technician

## 2021-10-27 DIAGNOSIS — Z20822 Contact with and (suspected) exposure to covid-19: Secondary | ICD-10-CM | POA: Diagnosis not present

## 2021-10-27 NOTE — Telephone Encounter (Signed)
Received information that he was approved for grant to cover his farxiga, I contacted pharmacy and relayed this information and contacted pt to let him know as well.  ? ? ?Total amount awarded, $10,000. ?  ?ID [id number]  ?   ?BIN [id number]  ?   ?PCN [id number]  ?   ?GROUP [id number]  ?  ?09/27/21-09/26/22   ? ? ?Marylouise Stacks, EMT-Paramedic  ?10/27/21 ? ?

## 2021-10-27 NOTE — Telephone Encounter (Signed)
Advanced Heart Failure Patient Advocate Encounter ? ?The patient was approved for a [employer] grant that will help cover the cost of Farxiga. Total amount awarded, $10,000. ? ?ID [id number]  ?   ?BIN [id number]  ?   ?PCN [id number]  ?   ?GROUP [id number]  ? ?09/27/21-09/26/22   ? ?Information provided to Katie (EMT) to give to the pharmacy. ? ?Charlann Boxer, CPhT ? ? ? ?

## 2021-11-01 ENCOUNTER — Telehealth (HOSPITAL_COMMUNITY): Payer: Self-pay

## 2021-11-01 ENCOUNTER — Ambulatory Visit (INDEPENDENT_AMBULATORY_CARE_PROVIDER_SITE_OTHER): Payer: Medicare Other

## 2021-11-01 DIAGNOSIS — Z95810 Presence of automatic (implantable) cardiac defibrillator: Secondary | ICD-10-CM

## 2021-11-01 DIAGNOSIS — I5022 Chronic systolic (congestive) heart failure: Secondary | ICD-10-CM

## 2021-11-01 NOTE — Telephone Encounter (Signed)
Spoke to [name] who reports feeling good and he states he picked up his [place] and has been taking it with no trouble. He reports having some slight left sided chest pain described as a muscle pull in his left rib cage area, he denied shortness of breath or dizziness. I advised him to try OTC pain meds and if no improvements to let me know. He was reminded of his appointment at clinic tomorrow for CPX test. He agreed and plans on being there. We planned for home visit next week on Monday morning. Call complete.  ?

## 2021-11-01 NOTE — Progress Notes (Signed)
EPIC Encounter for ICM Monitoring ? ?Patient Name: [name] is a 71 y.o. male ?Date: 11/01/2021 ?Primary Care Physican: [name], MD ?Primary Cardiologist: Turner/Bensimohn ?Electrophysiologist: Allred ?10/20/2021 Weight: 140 lbs ?11/02/2021 Weight: 144 - 145 lbs ?                                                         ?  ?Spoke with patient and heart failure questions reviewed.  Pt asymptomatic for fluid accumulation.   He reports 40 mg Torsemide is causing him to urinate so much he cut back to one tablet this week.  He is feeling okay at this time.  ?  ?CorVue thoracic impedance suggesting possible dryness starting 3/19 but was suggesting possible fluid accumulation 3/8-3/18.  ?  ?Prescribed: ?Torsemide 20 mg take 2 tablets (40 mg total) daily.  3/21 Pt reports taking Furosemide 1 tablet daily instead of 2. ?Potassium 20 mEq take 1 tablet by mouth daily.  ?Spironolactone 25 mg take 1 tablet by mouth daily. ?Farxiga 10 mg take 1 tablet by mouth daily. ?  ?Labs: ?10/21/2021 Creatinine 1.18, BUN 21, Potassium 4.0, Sodium 137, GFR >60 ?10/05/2021 Creatinine 1.52, BUN 30, Potassium 4.1, Sodium 136, GFR 48 ?10/04/2021 Creatinine 1.59, BUN 34, Potassium 4.1, Sodium 138, GFR 46  ?10/03/2021 Creatinine 1.35, BUN 25, Potassium 3.7, Sodium 141, GFR 56  ?10/02/2021 Creatinine 1.41, BUN 28, Potassium 3.0, Sodium 140, GFR 53  ?10/01/2021 Creatinine 1.65, BUN 30, Potassium 4.1, Sodium 138, GFR 44  ?09/30/2021 Creatinine 1.66, BUN 29, Potassium 3.8, Sodium 138, GFR 44  ?A complete set of results can be found in Results Review. ?  ?Recommendations:  Advised if he develops fluid symptoms then should return to taking Torsemide 40 mg as prescribed.  ?  ?Follow-up plan: ICM clinic phone appointment on 11/08/2021 to recheck fluid levels.  91 day device clinic remote transmission 11/10/2021.   ?  ?EP/Cardiology Office Visits:  11/19/2021 with Dr Haroldine Laws.   Recall 05/14/2021 with Dr Rayann Heman.   ?  ?Copy of ICM check sent to Dr.  Rayann Heman.  ? ?3 month ICM trend: 11/01/2021. ? ? ? ?12-14 Month ICM trend:  ? ? ? ?[name], RN ?11/01/2021 ?10:07 AM ? ?

## 2021-11-02 ENCOUNTER — Ambulatory Visit (HOSPITAL_COMMUNITY): Payer: Medicare Other | Attending: Adult Health

## 2021-11-02 ENCOUNTER — Other Ambulatory Visit: Payer: Self-pay

## 2021-11-02 DIAGNOSIS — I5022 Chronic systolic (congestive) heart failure: Secondary | ICD-10-CM

## 2021-11-03 ENCOUNTER — Ambulatory Visit (HOSPITAL_COMMUNITY): Payer: Medicare Other

## 2021-11-04 ENCOUNTER — Telehealth: Payer: Self-pay | Admitting: *Deleted

## 2021-11-04 DIAGNOSIS — E1122 Type 2 diabetes mellitus with diabetic chronic kidney disease: Secondary | ICD-10-CM | POA: Diagnosis not present

## 2021-11-04 DIAGNOSIS — E039 Hypothyroidism, unspecified: Secondary | ICD-10-CM | POA: Diagnosis not present

## 2021-11-04 DIAGNOSIS — I10 Essential (primary) hypertension: Secondary | ICD-10-CM | POA: Diagnosis not present

## 2021-11-04 DIAGNOSIS — E785 Hyperlipidemia, unspecified: Secondary | ICD-10-CM | POA: Diagnosis not present

## 2021-11-04 NOTE — Telephone Encounter (Signed)
Called patient's son - [name] to inform of CT being rescheduled for 11-15-21 - arrival time- 8:15 am @ [facility], no restrictions to test, patient to receive results from Dr. Sondra Come on 11-18-21 @ 8:45 am, lvm for a return call ?

## 2021-11-08 ENCOUNTER — Ambulatory Visit (INDEPENDENT_AMBULATORY_CARE_PROVIDER_SITE_OTHER): Payer: Medicare Other

## 2021-11-08 ENCOUNTER — Telehealth: Payer: Self-pay

## 2021-11-08 ENCOUNTER — Ambulatory Visit: Payer: Medicare Other | Admitting: Radiation Oncology

## 2021-11-08 ENCOUNTER — Other Ambulatory Visit (HOSPITAL_COMMUNITY): Payer: Self-pay | Admitting: *Deleted

## 2021-11-08 ENCOUNTER — Other Ambulatory Visit (HOSPITAL_COMMUNITY): Payer: Self-pay

## 2021-11-08 DIAGNOSIS — Z95810 Presence of automatic (implantable) cardiac defibrillator: Secondary | ICD-10-CM | POA: Diagnosis not present

## 2021-11-08 DIAGNOSIS — I5022 Chronic systolic (congestive) heart failure: Secondary | ICD-10-CM

## 2021-11-08 MED ORDER — POTASSIUM CHLORIDE CRYS ER 20 MEQ PO TBCR: 20.0000 meq | EXTENDED_RELEASE_TABLET | Freq: Every day | ORAL | 3 refills | Status: DC

## 2021-11-08 MED ORDER — TORSEMIDE 20 MG PO TABS: 40.0000 mg | ORAL_TABLET | Freq: Every day | ORAL | 3 refills | Status: DC

## 2021-11-08 MED ORDER — DIGOXIN 125 MCG PO TABS: 0.1250 mg | ORAL_TABLET | Freq: Every day | ORAL | 3 refills | Status: DC

## 2021-11-08 MED ORDER — SPIRONOLACTONE 25 MG PO TABS: 25.0000 mg | ORAL_TABLET | Freq: Every day | ORAL | 3 refills | Status: DC

## 2021-11-08 MED ORDER — DAPAGLIFLOZIN PROPANEDIOL 10 MG PO TABS: 10.0000 mg | ORAL_TABLET | Freq: Every day | ORAL | 3 refills | Status: AC

## 2021-11-08 NOTE — Progress Notes (Signed)
EPIC Encounter for ICM Monitoring ? ?Patient Name: [name] is a 71 y.o. male ?Date: 11/08/2021 ?Primary Care Physican: [name], MD ?Primary Cardiologist: Turner/Bensimohn ?Electrophysiologist: Allred ?10/20/2021 Weight: 140 lbs ?11/02/2021 Weight: 144 - 145 lbs ?                                                         ?  ?Attempted call to patient and unable to reach.  Transmission reviewed.  ?  ?CorVue thoracic impedance suggesting normal fluid levels. Impedance was suggesting possible fluid accumulation from 3/8-3/18. ?  ?Prescribed: ?Torsemide 20 mg take 2 tablets (40 mg total) daily.  3/21 Pt reports taking Furosemide 1 tablet daily instead of 2. ?Potassium 20 mEq take 1 tablet by mouth daily.  ?Spironolactone 25 mg take 1 tablet by mouth daily. ?Farxiga 10 mg take 1 tablet by mouth daily. ?  ?Labs: ?10/21/2021 Creatinine 1.18, BUN 21, Potassium 4.0, Sodium 137, GFR >60 ?10/05/2021 Creatinine 1.52, BUN 30, Potassium 4.1, Sodium 136, GFR 48 ?10/04/2021 Creatinine 1.59, BUN 34, Potassium 4.1, Sodium 138, GFR 46  ?10/03/2021 Creatinine 1.35, BUN 25, Potassium 3.7, Sodium 141, GFR 56  ?10/02/2021 Creatinine 1.41, BUN 28, Potassium 3.0, Sodium 140, GFR 53  ?10/01/2021 Creatinine 1.65, BUN 30, Potassium 4.1, Sodium 138, GFR 44  ?09/30/2021 Creatinine 1.66, BUN 29, Potassium 3.8, Sodium 138, GFR 44  ?A complete set of results can be found in Results Review. ?  ?Recommendations:  Unable to reach.   ?  ?Follow-up plan: ICM clinic phone appointment on 11/22/2021.  91 day device clinic remote transmission 02/09/2022.   ?  ?EP/Cardiology Office Visits:  11/19/2021 with Dr Haroldine Laws.   Recall 05/14/2021 with Dr Rayann Heman.   ?  ?Copy of ICM check sent to Dr. Rayann Heman. ? ?3 month ICM trend: 11/08/2021. ? ? ? ?12-14 Month ICM trend:  ? ? ? ?[name], RN ?11/08/2021 ?8:03 AM ? ?

## 2021-11-08 NOTE — Telephone Encounter (Signed)
Remote ICM transmission received.  Attempted call to patient regarding ICM remote transmission and no answer or voice mail option.  

## 2021-11-08 NOTE — Progress Notes (Signed)
Paramedicine Encounter ? ? ? Patient ID: [name], male    DOB: 1951/06/30, 71 y.o.   MRN: [id number] ? ?Arrived for home visit for [name] who reports feeling good with no complaints today. He denied chest pain, shortness of breath, dizziness or trouble sleeping. He reports he has been taking his medications. We reviewed them and he has been taking some of them incorrectly.  ? ?Torsemide- only taking one 20mg  tablet in the morning. (He says this makes him urinate too much.)  ? ?Losartan- he was taking whole 25mg  tablet rather than half.  ? ?Metoprolol- he was taking whole 100mg  tablet rather than half.  ? ?We discussed this and he agreed to taking correct doses of Losartan and Metoprolol and he showed me that he can cut these in half on his own. He did not want to take the full 40mg  of Torsemide because he says this makes him pee too much despite me explaining the benefits. I will make HF clinic aware of this.  ? ?He is needing all HF meds sent to [facility] in [place]. I will have them send same.  ? ?CBG-210 (after breakfast)  ? ?ICD was checked by device clinic with fluid levels looking okay. I explained to him if he gained 3 lbs overnight or 5 lbs in a week to call me. He agrees with this. We plan to meet again in two weeks. Home visit complete.  ? ? ? ? ?Patient Care Team: ?[name], MD as PCP - General (Family Medicine) ?[name], MD as PCP - Cardiology (Cardiology) ?Thompson Grayer, MD as PCP - Electrophysiology (Cardiology) ? ?Patient Active Problem List  ? Diagnosis Date Noted  ? Hypothyroidism 09/30/2021  ? Acute renal failure superimposed on stage 3a chronic kidney disease ([place]) 09/30/2021  ? Elevated troponin 09/30/2021  ? Normal anion gap metabolic acidosis 32/44/0102  ? Acute on chronic systolic (congestive) heart failure ([place]) 09/30/2021  ? Acute on chronic systolic CHF (congestive heart failure) ([place]) 08/30/2021  ? Ischemic cardiomyopathy 02/11/2020  ? Left bundle branch block  01/08/2020  ? Squamous cell carcinoma of bronchus in left lower lobe ([place]) 11/22/2018  ? Nodule of lower lobe of left lung 10/27/2018  ? OSA (obstructive sleep apnea) 07/04/2018  ? Chronic systolic CHF (congestive heart failure) ([place]) 07/04/2018  ? S/P TAVR (transcatheter aortic valve replacement) 05/15/2018  ? History of CVA (cerebrovascular accident)   ? Cardiomyopathy ([place]) 11/11/2016  ? Aortic stenosis, severe 06/27/2014  ? AAA (abdominal aortic aneurysm)   ? Insomnia   ? Hypertension   ? SOB (shortness of breath)   ? Insulin dependent type 2 diabetes mellitus ([place]) 12/28/2012  ? Dyslipidemia 12/28/2012  ? CAD (coronary artery disease), native coronary artery 02/13/2012  ? Hx of radiation therapy to mediastinum 08/16/1983  ? Malignant seminoma of mediastinum ([place]) 08/16/1983  ? ? ?Current Outpatient Medications:  ?  amitriptyline (ELAVIL) 25 MG tablet, Take 25 mg by mouth at bedtime. , Disp: , Rfl:  ?  aspirin EC 81 MG EC tablet, Take 1 tablet (81 mg total) by mouth daily. Swallow whole., Disp: 30 tablet, Rfl: 11 ?  B Complex-C (SUPER B COMPLEX PO), Take 1 tablet by mouth daily., Disp: , Rfl:  ?  B-D ULTRAFINE III SHORT PEN 31G X 8 MM MISC, 1 each by Other route daily as needed (GLUCOSE TESTING (INSULINE NEEDLE)). , Disp: , Rfl:  ?  dapagliflozin propanediol (FARXIGA) 10 MG TABS tablet, Take 1 tablet (10 mg total) by  mouth daily., Disp: 90 tablet, Rfl: 3 ?  digoxin (LANOXIN) 0.125 MG tablet, Take 1 tablet (0.125 mg total) by mouth daily., Disp: 30 tablet, Rfl: 0 ?  insulin detemir (LEVEMIR) 100 UNIT/ML injection, Inject 10-18 Units into the skin See admin instructions. 18 units in the morning, 10 units at bedtime, Disp: , Rfl:  ?  insulin lispro (HUMALOG) 100 UNIT/ML KwikPen, Inject 2-3 Units into the skin See admin instructions. Per sliding scale at Breakfast and Dinner, Disp: , Rfl:  ?  levothyroxine (SYNTHROID, LEVOTHROID) 50 MCG tablet, Take 50 mcg by mouth daily before breakfast. , Disp: , Rfl:  ?  losartan  (COZAAR) 25 MG tablet, Take 0.5 tablets (12.5 mg total) by mouth daily., Disp: , Rfl:  ?  metFORMIN (GLUCOPHAGE) 500 MG tablet, Take 2 tablets (1,000 mg total) by mouth 2 (two) times daily with a meal., Disp: , Rfl:  ?  metoprolol succinate (TOPROL-XL) 100 MG 24 hr tablet, Take 0.5 tablets (50 mg total) by mouth daily. Take with or immediately following a meal., Disp: , Rfl:  ?  Multiple Vitamin (MULTIVITAMIN) tablet, Take 1 tablet by mouth daily., Disp: , Rfl:  ?  ONE TOUCH ULTRA TEST test strip, 1 each by Other route as needed for other. , Disp: , Rfl:  ?  potassium chloride SA (KLOR-CON M) 20 MEQ tablet, Take 1 tablet (20 mEq total) by mouth daily., Disp: 30 tablet, Rfl: 0 ?  simvastatin (ZOCOR) 20 MG tablet, Take 20 mg by mouth every evening. , Disp: , Rfl:  ?  spironolactone (ALDACTONE) 25 MG tablet, Take 1 tablet (25 mg total) by mouth daily., Disp: 30 tablet, Rfl: 0 ?  torsemide (DEMADEX) 20 MG tablet, Take 2 tablets (40 mg total) by mouth daily., Disp: 60 tablet, Rfl: 0 ?  zolpidem (AMBIEN) 5 MG tablet, Take 5 mg by mouth at bedtime as needed for sleep., Disp: , Rfl:  ?Allergies  ?Allergen Reactions  ? Atorvastatin Other (See Comments)  ?  Muscle pain ?Other reaction(s): cramps  ? Entresto [Sacubitril-Valsartan] Other (See Comments)  ?  hypotension  ? Adhesive [Tape] Rash  ? ? ? ?Social History  ? ?Socioeconomic History  ? Marital status: Married  ?  Spouse name: Not on file  ? Number of children: Not on file  ? Years of education: Not on file  ? Highest education level: Not on file  ?Occupational History  ? Occupation: security guard  ?  Comment: [employer]  ?Tobacco Use  ? Smoking status: Former  ?  Types: Cigarettes  ?  Quit date: 03/06/1985  ?  Years since quitting: 36.7  ? Smokeless tobacco: Never  ?Vaping Use  ? Vaping Use: Never used  ?Substance and Sexual Activity  ? Alcohol use: No  ? Drug use: No  ? Sexual activity: Not Currently  ?Other Topics Concern  ? Not on file  ?Social History Narrative  ? Not on  file  ? ?Social Determinants of Health  ? ?Financial Resource Strain: Low Risk   ? Difficulty of Paying Living Expenses: Not very hard  ?Food Insecurity: No Food Insecurity  ? Worried About Charity fundraiser in the Last Year: Never true  ? Ran Out of Food in the Last Year: Never true  ?Transportation Needs: No Transportation Needs  ? Lack of Transportation (Medical): No  ? Lack of Transportation (Non-Medical): No  ?Physical Activity: Not on file  ?Stress: Not on file  ?Social Connections: Not on file  ?Intimate Partner Violence:  Not on file  ? ? ?Physical Exam ?Vitals reviewed.  ?Constitutional:   ?   Appearance: Normal appearance. He is normal weight.  ?HENT:  ?   Head: Normocephalic.  ?   Nose: Nose normal.  ?   Mouth/Throat:  ?   Mouth: Mucous membranes are moist.  ?   Pharynx: Oropharynx is clear.  ?Eyes:  ?   Conjunctiva/sclera: Conjunctivae normal.  ?   Pupils: Pupils are equal, round, and reactive to light.  ?Cardiovascular:  ?   Rate and Rhythm: Normal rate and regular rhythm.  ?   Pulses: Normal pulses.  ?   Heart sounds: Normal heart sounds.  ?Pulmonary:  ?   Effort: Pulmonary effort is normal.  ?   Breath sounds: Normal breath sounds.  ?Abdominal:  ?   General: Abdomen is flat.  ?   Palpations: Abdomen is soft.  ?Musculoskeletal:     ?   General: No swelling. Normal range of motion.  ?   Cervical back: Normal range of motion.  ?   Right lower leg: No edema.  ?   Left lower leg: No edema.  ?Skin: ?   General: Skin is warm and dry.  ?   Capillary Refill: Capillary refill takes less than 2 seconds.  ?Neurological:  ?   General: No focal deficit present.  ?   Mental Status: He is alert. Mental status is at baseline.  ?Psychiatric:     ?   Mood and Affect: Mood normal.  ? ? ? ? ? ? ?Future Appointments  ?Date Time Provider [place]  ?11/10/2021  7:10 AM [facility] DEVICE REMOTES [facility]  ?11/15/2021  8:30 AM [facility] 1 [facility] [place]  ?11/18/2021  8:45 AM Gery Pray, MD [facility]  None  ?11/19/2021 10:40 AM Bensimhon, Shaune Pascal, MD [facility] None  ?11/22/2021  7:15 AM [facility] DEVICE REMOTES [facility]  ?02/09/2022  7:10 AM [facility] DEVICE REMOTES [facility]

## 2021-11-10 ENCOUNTER — Ambulatory Visit (INDEPENDENT_AMBULATORY_CARE_PROVIDER_SITE_OTHER): Payer: Medicare Other

## 2021-11-10 ENCOUNTER — Telehealth (HOSPITAL_COMMUNITY): Payer: Self-pay | Admitting: Licensed Clinical Social Worker

## 2021-11-10 DIAGNOSIS — I255 Ischemic cardiomyopathy: Secondary | ICD-10-CM | POA: Diagnosis not present

## 2021-11-10 LAB — CUP PACEART REMOTE DEVICE CHECK
Battery Remaining Longevity: 84 mo
Battery Remaining Percentage: 80 %
Battery Voltage: 2.99 V
Brady Statistic AP VP Percent: 0 %
Brady Statistic AP VS Percent: 1 %
Brady Statistic AS VP Percent: 1 %
Brady Statistic AS VS Percent: 99 %
Brady Statistic RA Percent Paced: 1 %
Brady Statistic RV Percent Paced: 1 %
Date Time Interrogation Session: 20230327020028
HighPow Impedance: 54 Ohm
HighPow Impedance: 54 Ohm
Implantable Lead Implant Date: 20210629
Implantable Lead Implant Date: 20210629
Implantable Lead Location: 753859
Implantable Lead Location: 753860
Implantable Pulse Generator Implant Date: 20210629
Lead Channel Impedance Value: 410 Ohm
Lead Channel Impedance Value: 430 Ohm
Lead Channel Pacing Threshold Amplitude: 0.5 V
Lead Channel Pacing Threshold Amplitude: 0.75 V
Lead Channel Pacing Threshold Pulse Width: 0.5 ms
Lead Channel Pacing Threshold Pulse Width: 0.5 ms
Lead Channel Sensing Intrinsic Amplitude: 1.2 mV
Lead Channel Sensing Intrinsic Amplitude: 11.9 mV
Lead Channel Setting Pacing Amplitude: 2 V
Lead Channel Setting Pacing Amplitude: 2.5 V
Lead Channel Setting Pacing Pulse Width: 0.5 ms
Lead Channel Setting Sensing Sensitivity: 0.5 mV
Pulse Gen Serial Number: 9932570

## 2021-11-10 NOTE — Telephone Encounter (Signed)
HF Paramedicine Team Based Care Meeting ? ?HF MD- NA  ?HF NP - Bill Clegg NP-C  ? ?[facility] HF Paramedicine  ?[name] ?[name]  ?[name] ? ?Hospital admit within the last 30 days for heart failure? no  ? ?Medications concerns? Doesn't want her to fill pillbox- noticed pt taking some medications inappropriately ? ?Transportation issues ? no ? ?Education needs? Medication concerns and education regarding diet and fluid restrictions  ? ?Eligible for discharge? Don't think we need to see much longer- will see how he looks for visit next week ? ?Jorge Ny, LCSW ?Clinical Social Worker ?[facility] ?Desk#: [phone] ?Cell#: [phone] ? ? ?

## 2021-11-15 ENCOUNTER — Ambulatory Visit (HOSPITAL_COMMUNITY)
Admission: RE | Admit: 2021-11-15 | Discharge: 2021-11-15 | Disposition: A | Discharge status: Home / Self Care | Payer: Medicare Other | Source: Ambulatory Visit | Attending: Radiation Oncology | Admitting: Radiation Oncology

## 2021-11-15 DIAGNOSIS — C3432 Malignant neoplasm of lower lobe, left bronchus or lung: Secondary | ICD-10-CM | POA: Diagnosis not present

## 2021-11-15 DIAGNOSIS — J90 Pleural effusion, not elsewhere classified: Secondary | ICD-10-CM | POA: Diagnosis not present

## 2021-11-15 DIAGNOSIS — R911 Solitary pulmonary nodule: Secondary | ICD-10-CM | POA: Diagnosis not present

## 2021-11-15 DIAGNOSIS — J439 Emphysema, unspecified: Secondary | ICD-10-CM | POA: Diagnosis not present

## 2021-11-15 DIAGNOSIS — C3490 Malignant neoplasm of unspecified part of unspecified bronchus or lung: Secondary | ICD-10-CM | POA: Diagnosis not present

## 2021-11-17 NOTE — Progress Notes (Signed)
?Radiation Oncology         [phone] ?________________________________ ? ?Name: [name] MRN: [id number]  ?Date: 11/18/2021  DOB: January 31, 1951 ? ?Follow-Up Visit Note ? ?CC: Koirala, Dibas, MD  Koirala, Dibas, MD ? ?  ICD-10-CM   ?1. Squamous cell carcinoma of bronchus in left lower lobe (HCC)  C34.32 CT CHEST WO CONTRAST  ?  ?2. Malignant seminoma of mediastinum (HCC)  C38.3   ?  ? ? ?Diagnosis: Stage IB (T2a, N0, M0) squamous cell carcinoma of the left lower lung ? ?Interval Since Last Radiation: 2 years, 11 months, and 1 week ? ?12/03/2018 - 12/13/2018: LLL / 50 Gy in 5 fractions (SBRT) ?  ?1986: RT given as part of the management of his malignant seminoma presenting in the upper mediastinum. Details of this treatment are not available. The patient reports having several weeks of radiation therapy. He reports his treatments were given at [facility]. ? ?Narrative:  The patient returns today for routine follow-up and to review recent imaging, he was last seen here for follow up on 05/06/21.   ? ?Since his last visit, the patient has had several hospital encounters, detailed as follows:  ?--  Admission 08/30/21 through 09/03/21 fro CHF: Patient presented for the evaluation of shortness of breath as per cardiology recommendation from outpatient.  He was sent for a stress test and was was found to be short of breath.  Echo showed EF of 20 to 25% with severely decreased left ventricular function, large left-sided pleural effusion.  He was accordingly sent to the ED for admission and diuresis. CXR  performed for ED evaluation on 01/16 showed new small bilateral pleural effusions with associated bibasilar atelectasis and vascular congestion.  He improved clinically with diuresis and was transitioned to oral lasix and instructed to continue high dose metoprolol. He was also started on low dose Aldactone [place]. He was discharged home on 01/20 in stable condition.  ?-- Admission 09/30/21 through 10/05/21 for CHF: Patient  presented for evaluation of SOB, LE edema, and orthopnea. On examination, the patient was noted with volume overload, elevated BNP, and chest x-ray with small right pleural effusion unchanged since his last admission. Hospital course included diuresis with IV Lasix 80 Mg twice daily, and he was instructed to continue Toprol, Digoxin, and Aldactone Farxiga. The patient was also noted to report a few episodes of nonexertional left-sided chest pain the week prior to admission.  Nuclear medicine stress test done 2 days before admission was high risk. High-sensitivity troponin was also noted to be mildly elevated but stable.  ? ?His most recent chest CT performed on 11/15/21 showed: post treatment volume loss in the left hemithorax, known small partially loculated left pleural effusion (new since last chest CT on 05/05/21), and the known stable right upper lobe nodule. Otherwise, CT showed no evidence of recurrent or metastatic disease. ? ?He denies any pain within the chest area significant cough or hemoptysis. ? ? ?Allergies:  is allergic to atorvastatin, entresto [sacubitril-valsartan], and adhesive [tape]. ? ?Meds: ?Current Outpatient Medications  ?Medication Sig Dispense Refill  ? amitriptyline (ELAVIL) 25 MG tablet Take 25 mg by mouth at bedtime.     ? aspirin EC 81 MG EC tablet Take 1 tablet (81 mg total) by mouth daily. Swallow whole. 30 tablet 11  ? B Complex-C (SUPER B COMPLEX PO) Take 1 tablet by mouth daily.    ? B-D ULTRAFINE III SHORT PEN 31G X 8 MM MISC 1 each by Other route daily  as needed (GLUCOSE TESTING (INSULINE NEEDLE)).     ? dapagliflozin propanediol (FARXIGA) 10 MG TABS tablet Take 1 tablet (10 mg total) by mouth daily. 90 tablet 3  ? digoxin (LANOXIN) 0.125 MG tablet Take 1 tablet (0.125 mg total) by mouth daily. 30 tablet 3  ? insulin detemir (LEVEMIR) 100 UNIT/ML injection Inject 10-18 Units into the skin See admin instructions. 18 units in the morning, 10 units at bedtime    ? insulin lispro  (HUMALOG) 100 UNIT/ML KwikPen Inject 2-3 Units into the skin See admin instructions. Per sliding scale at Breakfast and Dinner    ? levothyroxine (SYNTHROID, LEVOTHROID) 50 MCG tablet Take 50 mcg by mouth daily before breakfast.     ? losartan (COZAAR) 25 MG tablet Take 0.5 tablets (12.5 mg total) by mouth daily.    ? metFORMIN (GLUCOPHAGE) 500 MG tablet Take 2 tablets (1,000 mg total) by mouth 2 (two) times daily with a meal.    ? metoprolol succinate (TOPROL-XL) 100 MG 24 hr tablet Take 0.5 tablets (50 mg total) by mouth daily. Take with or immediately following a meal.    ? Multiple Vitamin (MULTIVITAMIN) tablet Take 1 tablet by mouth daily.    ? ONE TOUCH ULTRA TEST test strip 1 each by Other route as needed for other.     ? potassium chloride SA (KLOR-CON M) 20 MEQ tablet Take 1 tablet (20 mEq total) by mouth daily. 30 tablet 3  ? simvastatin (ZOCOR) 20 MG tablet Take 20 mg by mouth every evening.     ? spironolactone (ALDACTONE) 25 MG tablet Take 1 tablet (25 mg total) by mouth daily. 30 tablet 3  ? torsemide (DEMADEX) 20 MG tablet Take 2 tablets (40 mg total) by mouth daily. 60 tablet 3  ? zolpidem (AMBIEN) 5 MG tablet Take 5 mg by mouth at bedtime as needed for sleep.    ? ?No current facility-administered medications for this encounter.  ? ? ?Physical Findings: ?The patient is in no acute distress. Patient is alert and oriented. ? height is 5\' 11"  (1.803 m) and weight is 148 lb 6.4 oz (67.3 kg). His temperature is 98 ?F (36.7 ?C). His blood pressure is 110/70 and his pulse is 98. His respiration is 24 (abnormal) and oxygen saturation is 100%. .  No significant changes. Lungs are clear to auscultation bilaterally. Heart has regular rate and rhythm. No palpable cervical, supraclavicular, or axillary adenopathy. Abdomen soft, non-tender, normal bowel sounds.  ? ? ?Lab Findings: ?Lab Results  ?Component Value Date  ? WBC 4.1 10/03/2021  ? HGB 13.0 10/03/2021  ? HCT 38.7 (L) 10/03/2021  ? MCV 71.8 (L) 10/03/2021   ? PLT 278 10/03/2021  ? ? ?Radiographic Findings: ?CT CHEST WO CONTRAST ? ?Result Date: 11/15/2021 ?CLINICAL DATA:  Lung cancer, assess treatment response. * Tracking Code: BO * EXAM: CT CHEST WITHOUT CONTRAST TECHNIQUE: Multidetector CT imaging of the chest was performed following the standard protocol without IV contrast. RADIATION DOSE REDUCTION: This exam was performed according to the departmental dose-optimization program which includes automated exposure control, adjustment of the mA and/or kV according to patient size and/or use of iterative reconstruction technique. COMPARISON:  05/05/2021. FINDINGS: Cardiovascular: Atherosclerotic calcification of the aorta and coronary arteries. Aortic valve replacement. Enlarged pulmonic trunk and heart. No pericardial effusion. Mediastinum/Nodes: Subcentimeter low-attenuation left thyroid nodule. No follow-up recommended. (Ref: J Am Coll Radiol. 2015 Feb;12(2): 143-50).Mediastinal lymph nodes are not enlarged by CT size criteria and appear similar to 05/05/2021. Hilar regions  are difficult to evaluate without IV contrast. No axillary adenopathy. Esophagus is grossly unremarkable. Lungs/Pleura: Biapical pleuroparenchymal scarring, right greater than left. Paraseptal emphysema. 7 mm anterior segment right upper lobe nodule (7/79), unchanged. Scattered subsegmental volume loss in the lung bases. Post treatment collapse/consolidation in the left perihilar region and left lower lobe, unchanged. Small, slightly loculated left pleural effusion, largely new. No pleural fluid. Obstructed superior segment left lower lobe bronchus, unchanged. Airway is otherwise unremarkable. Upper Abdomen: Visualized portions of the liver, gallbladder, adrenal glands, kidneys, spleen, pancreas, stomach and bowel are grossly unremarkable. Musculoskeletal: No worrisome lytic or sclerotic lesions. IMPRESSION: 1. Post treatment volume loss in the left hemithorax. No evidence of recurrent or metastatic  disease. 2. Small, partially loculated left pleural effusion, largely new from 05/05/2021. 3. 7 mm right upper lobe nodule, stable. 4. Aortic atherosclerosis (ICD10-I70.0). Coronary artery calcifica

## 2021-11-18 ENCOUNTER — Encounter: Payer: Self-pay | Admitting: Radiation Oncology

## 2021-11-18 ENCOUNTER — Other Ambulatory Visit: Payer: Self-pay

## 2021-11-18 ENCOUNTER — Ambulatory Visit
Admission: RE | Admit: 2021-11-18 | Discharge: 2021-11-18 | Disposition: A | Discharge status: Home / Self Care | Payer: Medicare Other | Source: Ambulatory Visit | Attending: Radiation Oncology | Admitting: Radiation Oncology

## 2021-11-18 VITALS — BP 110/70 | HR 98 | Temp 98.0°F | Resp 24 | Ht 71.0 in | Wt 148.4 lb

## 2021-11-18 DIAGNOSIS — Z7982 Long term (current) use of aspirin: Secondary | ICD-10-CM | POA: Diagnosis not present

## 2021-11-18 DIAGNOSIS — J90 Pleural effusion, not elsewhere classified: Secondary | ICD-10-CM | POA: Diagnosis not present

## 2021-11-18 DIAGNOSIS — C3432 Malignant neoplasm of lower lobe, left bronchus or lung: Secondary | ICD-10-CM

## 2021-11-18 DIAGNOSIS — Z85118 Personal history of other malignant neoplasm of bronchus and lung: Secondary | ICD-10-CM | POA: Insufficient documentation

## 2021-11-18 DIAGNOSIS — I700 Atherosclerosis of aorta: Secondary | ICD-10-CM | POA: Diagnosis not present

## 2021-11-18 DIAGNOSIS — J439 Emphysema, unspecified: Secondary | ICD-10-CM | POA: Diagnosis not present

## 2021-11-18 DIAGNOSIS — Z79899 Other long term (current) drug therapy: Secondary | ICD-10-CM | POA: Insufficient documentation

## 2021-11-18 DIAGNOSIS — Z923 Personal history of irradiation: Secondary | ICD-10-CM | POA: Diagnosis not present

## 2021-11-18 DIAGNOSIS — Z7984 Long term (current) use of oral hypoglycemic drugs: Secondary | ICD-10-CM | POA: Diagnosis not present

## 2021-11-18 DIAGNOSIS — C383 Malignant neoplasm of mediastinum, part unspecified: Secondary | ICD-10-CM

## 2021-11-18 NOTE — Progress Notes (Signed)
[name] is here today for follow up post radiation to the lung. ? ?Lung Side: Left,patient completed treatment on 12/13/18 ? ?Does the patient complain of any of the following: ?Pain:denies ?Shortness of breath w/wo exertion: denies ?Cough: denies ?Hemoptysis: denies ?Pain with swallowing: denies ?Swallowing/choking concerns: denies ?Appetite: good ?Energy [name]: good ?Post radiation skin Changes: no ? ? ? ?Additional comments if applicable: nothing of note ? ?Vitals:  ? 11/18/21 0902  ?BP: 110/70  ?Pulse: 98  ?Resp: (!) 24  ?Temp: 98 ?F (36.7 ?C)  ?SpO2: 100%  ?Weight: 148 lb 6.4 oz (67.3 kg)  ?Height: 5\' 11"  (1.803 m)  ? ? ?

## 2021-11-19 ENCOUNTER — Encounter (HOSPITAL_COMMUNITY): Payer: Medicare Other | Admitting: Internal Medicine

## 2021-11-19 ENCOUNTER — Telehealth (HOSPITAL_COMMUNITY): Payer: Self-pay

## 2021-11-19 NOTE — Telephone Encounter (Signed)
Attempted to reach [name] to remind him of his clinic visit today. Left text message for him reminding him of same and that I would be meeting him there and to bring all meds. I will follow up.  ?

## 2021-11-22 ENCOUNTER — Telehealth (HOSPITAL_COMMUNITY): Payer: Self-pay

## 2021-11-22 ENCOUNTER — Ambulatory Visit (INDEPENDENT_AMBULATORY_CARE_PROVIDER_SITE_OTHER): Payer: Medicare Other

## 2021-11-22 DIAGNOSIS — Z95810 Presence of automatic (implantable) cardiac defibrillator: Secondary | ICD-10-CM

## 2021-11-22 DIAGNOSIS — I5022 Chronic systolic (congestive) heart failure: Secondary | ICD-10-CM

## 2021-11-22 NOTE — Telephone Encounter (Signed)
Called to set up home visit with [name] and [name] reports [name] is working today and wants to schedule for next Monday the 17th at 0830. Appointment confirmed and I will see him next week. [name] reports no needs at this time and knows to reach out if needed. Call complete.  ?

## 2021-11-23 NOTE — Progress Notes (Signed)
Remote ICD transmission.   

## 2021-11-24 NOTE — Progress Notes (Signed)
EPIC Encounter for ICM Monitoring ? ?Patient Name: [name] is a 71 y.o. male ?Date: 11/24/2021 ?Primary Care Physican: [name], MD ?Primary Cardiologist: Turner/Bensimohn ?Electrophysiologist: Allred ?10/20/2021 Weight: 140 lbs ?11/02/2021 Weight: 144 - 145 lbs ?                                                         ?  ?Spoke with patient and heart failure questions reviewed.  Pt asymptomatic for fluid accumulation.  Reports feeling well at this time and voices no complaints.  ?  ?CorVue thoracic impedance suggesting normal fluid levels.  ?  ?Prescribed: ?Torsemide 20 mg take 2 tablets (40 mg total) daily.  3/21 Pt reports taking Furosemide 1 tablet daily instead of 2. ?Potassium 20 mEq take 1 tablet by mouth daily.  ?Spironolactone 25 mg take 1 tablet by mouth daily. ?Farxiga 10 mg take 1 tablet by mouth daily. ?  ?Labs: ?10/21/2021 Creatinine 1.18, BUN 21, Potassium 4.0, Sodium 137, GFR >60 ?10/05/2021 Creatinine 1.52, BUN 30, Potassium 4.1, Sodium 136, GFR 48 ?10/04/2021 Creatinine 1.59, BUN 34, Potassium 4.1, Sodium 138, GFR 46  ?10/03/2021 Creatinine 1.35, BUN 25, Potassium 3.7, Sodium 141, GFR 56  ?10/02/2021 Creatinine 1.41, BUN 28, Potassium 3.0, Sodium 140, GFR 53  ?10/01/2021 Creatinine 1.65, BUN 30, Potassium 4.1, Sodium 138, GFR 44  ?09/30/2021 Creatinine 1.66, BUN 29, Potassium 3.8, Sodium 138, GFR 44  ?A complete set of results can be found in Results Review. ?  ?Recommendations:  No changes and encouraged to call if experiencing any fluid symptoms. ?  ?Follow-up plan: ICM clinic phone appointment on 12/27/2021.  91 day device clinic remote transmission 02/09/2022.   ?  ?EP/Cardiology Office Visits:  12/07/2021 with Advanced HF clinic.   Recall 05/14/2021 with Dr Rayann Heman.   ?  ?Copy of ICM check sent to Dr. Rayann Heman. ? ?3 month ICM trend: 11/18/2021. ? ? ? ? ?[name], RN ?11/24/2021 ?12:13 PM ? ?

## 2021-11-29 ENCOUNTER — Other Ambulatory Visit (HOSPITAL_COMMUNITY): Payer: Self-pay

## 2021-11-29 NOTE — Progress Notes (Signed)
Paramedicine Encounter ? ? ? Patient ID: [name], male    DOB: 1950-11-06, 71 y.o.   MRN: [id number] ? ?Arrived for home visit for [name] who reports feeling good today stating he has had some shortness of breath with some swelling at the end of the week last week. He reports he gained a few pounds and had some lower leg swelling. He reports taking 2 Torsemide on Friday 4/14 and 1 on Saturday 4/15. He is not taking Torsemide daily as prescribed and instructed he is taking this as needed. I have discussed the importance of taking his medications as instructed and he continues to elect to take his Torsemide as needed. He is taking all other meds appropriately.  ? ? ? ?Vitals and assessment as noted. No swelling today, lungs clear. He continues to work M-F at [employer]. He reports no trouble getting around for work. He denied chest pain, dizziness or any shortness of breath today. We reviewed appointments and confirmed same.We will follow up in clinic next week. He understands to call me if needed. Home visit complete.  ? ?Refills: ( I will call these in) ?Ambien  ?Amitriptyline ? ?  ? ? ?Patient Care Team: ?[name], MD as PCP - General (Family Medicine) ?[name], MD as PCP - Cardiology (Cardiology) ?Thompson Grayer, MD as PCP - Electrophysiology (Cardiology) ? ?Patient Active Problem List  ? Diagnosis Date Noted  ? Hypothyroidism 09/30/2021  ? Acute renal failure superimposed on stage 3a chronic kidney disease ([place]) 09/30/2021  ? Elevated troponin 09/30/2021  ? Normal anion gap metabolic acidosis 97/67/3419  ? Acute on chronic systolic (congestive) heart failure ([place]) 09/30/2021  ? Acute on chronic systolic CHF (congestive heart failure) ([place]) 08/30/2021  ? Ischemic cardiomyopathy 02/11/2020  ? Left bundle branch block 01/08/2020  ? Squamous cell carcinoma of bronchus in left lower lobe ([place]) 11/22/2018  ? Nodule of lower lobe of left lung 10/27/2018  ? OSA (obstructive sleep apnea) 07/04/2018   ? Chronic systolic CHF (congestive heart failure) ([place]) 07/04/2018  ? S/P TAVR (transcatheter aortic valve replacement) 05/15/2018  ? History of CVA (cerebrovascular accident)   ? Cardiomyopathy ([place]) 11/11/2016  ? Aortic stenosis, severe 06/27/2014  ? AAA (abdominal aortic aneurysm) ([place])   ? Insomnia   ? Hypertension   ? SOB (shortness of breath)   ? Insulin dependent type 2 diabetes mellitus ([place]) 12/28/2012  ? Dyslipidemia 12/28/2012  ? CAD (coronary artery disease), native coronary artery 02/13/2012  ? Hx of radiation therapy to mediastinum 08/16/1983  ? Malignant seminoma of mediastinum ([place]) 08/16/1983  ? ? ?Current Outpatient Medications:  ?  amitriptyline (ELAVIL) 25 MG tablet, Take 25 mg by mouth at bedtime. , Disp: , Rfl:  ?  aspirin EC 81 MG EC tablet, Take 1 tablet (81 mg total) by mouth daily. Swallow whole., Disp: 30 tablet, Rfl: 11 ?  B Complex-C (SUPER B COMPLEX PO), Take 1 tablet by mouth daily., Disp: , Rfl:  ?  B-D ULTRAFINE III SHORT PEN 31G X 8 MM MISC, 1 each by Other route daily as needed (GLUCOSE TESTING (INSULINE NEEDLE)). , Disp: , Rfl:  ?  dapagliflozin propanediol (FARXIGA) 10 MG TABS tablet, Take 1 tablet (10 mg total) by mouth daily., Disp: 90 tablet, Rfl: 3 ?  digoxin (LANOXIN) 0.125 MG tablet, Take 1 tablet (0.125 mg total) by mouth daily., Disp: 30 tablet, Rfl: 3 ?  insulin detemir (LEVEMIR) 100 UNIT/ML injection, Inject 10-18 Units into the skin See  admin instructions. 18 units in the morning, 10 units at bedtime, Disp: , Rfl:  ?  insulin lispro (HUMALOG) 100 UNIT/ML KwikPen, Inject 2-3 Units into the skin See admin instructions. Per sliding scale at Breakfast and Dinner, Disp: , Rfl:  ?  levothyroxine (SYNTHROID, LEVOTHROID) 50 MCG tablet, Take 50 mcg by mouth daily before breakfast. , Disp: , Rfl:  ?  losartan (COZAAR) 25 MG tablet, Take 0.5 tablets (12.5 mg total) by mouth daily., Disp: , Rfl:  ?  metFORMIN (GLUCOPHAGE) 500 MG tablet, Take 2 tablets (1,000 mg total) by mouth 2  (two) times daily with a meal., Disp: , Rfl:  ?  metoprolol succinate (TOPROL-XL) 100 MG 24 hr tablet, Take 0.5 tablets (50 mg total) by mouth daily. Take with or immediately following a meal., Disp: , Rfl:  ?  Multiple Vitamin (MULTIVITAMIN) tablet, Take 1 tablet by mouth daily., Disp: , Rfl:  ?  ONE TOUCH ULTRA TEST test strip, 1 each by Other route as needed for other. , Disp: , Rfl:  ?  potassium chloride SA (KLOR-CON M) 20 MEQ tablet, Take 1 tablet (20 mEq total) by mouth daily., Disp: 30 tablet, Rfl: 3 ?  simvastatin (ZOCOR) 20 MG tablet, Take 20 mg by mouth every evening. , Disp: , Rfl:  ?  spironolactone (ALDACTONE) 25 MG tablet, Take 1 tablet (25 mg total) by mouth daily., Disp: 30 tablet, Rfl: 3 ?  torsemide (DEMADEX) 20 MG tablet, Take 2 tablets (40 mg total) by mouth daily., Disp: 60 tablet, Rfl: 3 ?  zolpidem (AMBIEN) 5 MG tablet, Take 5 mg by mouth at bedtime as needed for sleep., Disp: , Rfl:  ?Allergies  ?Allergen Reactions  ? Atorvastatin Other (See Comments)  ?  Muscle pain ?Other reaction(s): cramps ?Other reaction(s): cramps  ? Entresto [Sacubitril-Valsartan] Other (See Comments)  ?  hypotension  ? Adhesive [Tape] Rash  ? ? ? ?Social History  ? ?Socioeconomic History  ? Marital status: Married  ?  Spouse name: Not on file  ? Number of children: Not on file  ? Years of education: Not on file  ? Highest education level: Not on file  ?Occupational History  ? Occupation: security guard  ?  Comment: [employer]  ?Tobacco Use  ? Smoking status: Former  ?  Types: Cigarettes  ?  Quit date: 03/06/1985  ?  Years since quitting: 36.7  ? Smokeless tobacco: Never  ?Vaping Use  ? Vaping Use: Never used  ?Substance and Sexual Activity  ? Alcohol use: No  ? Drug use: No  ? Sexual activity: Not Currently  ?Other Topics Concern  ? Not on file  ?Social History Narrative  ? Not on file  ? ?Social Determinants of Health  ? ?Financial Resource Strain: Low Risk   ? Difficulty of Paying Living Expenses: Not very hard  ?Food  Insecurity: No Food Insecurity  ? Worried About Charity fundraiser in the Last Year: Never true  ? Ran Out of Food in the Last Year: Never true  ?Transportation Needs: No Transportation Needs  ? Lack of Transportation (Medical): No  ? Lack of Transportation (Non-Medical): No  ?Physical Activity: Not on file  ?Stress: Not on file  ?Social Connections: Not on file  ?Intimate Partner Violence: Not on file  ? ? ?Physical Exam ?Vitals reviewed.  ?Constitutional:   ?   Appearance: Normal appearance. He is normal weight.  ?HENT:  ?   Head: Normocephalic.  ?   Nose: Nose normal.  ?  Mouth/Throat:  ?   Mouth: Mucous membranes are moist.  ?   Pharynx: Oropharynx is clear.  ?Eyes:  ?   Conjunctiva/sclera: Conjunctivae normal.  ?   Pupils: Pupils are equal, round, and reactive to light.  ?Cardiovascular:  ?   Rate and Rhythm: Normal rate and regular rhythm.  ?   Pulses: Normal pulses.  ?   Heart sounds: Normal heart sounds.  ?Pulmonary:  ?   Effort: Pulmonary effort is normal. No respiratory distress.  ?   Breath sounds: Normal breath sounds. No stridor. No wheezing, rhonchi or rales.  ?Abdominal:  ?   General: Abdomen is flat.  ?   Palpations: Abdomen is soft.  ?Musculoskeletal:     ?   General: No swelling. Normal range of motion.  ?   Cervical back: Normal range of motion.  ?   Right lower leg: No edema.  ?   Left lower leg: No edema.  ?Skin: ?   General: Skin is warm and dry.  ?   Capillary Refill: Capillary refill takes less than 2 seconds.  ?Neurological:  ?   General: No focal deficit present.  ?   Mental Status: He is alert. Mental status is at baseline.  ?Psychiatric:     ?   Mood and Affect: Mood normal.  ? ? ? ? ? ? ?Future Appointments  ?Date Time Provider [place]  ?12/07/2021  9:30 AM [facility] PA/NP [facility] None  ?12/27/2021  7:10 AM [facility] DEVICE REMOTES [facility]  ?02/09/2022  7:10 AM [facility] DEVICE REMOTES [facility]  ?05/11/2022  7:10 AM [facility] DEVICE REMOTES  [facility]  ?05/26/2022 11:15 AM Gery Pray, MD [facility] None  ?08/10/2022  7:10 AM [facility] DEVICE REMOTES [facility]  ? ? ? ?ACTION: ?Home visit completed ? ? ? ? ?

## 2021-12-06 ENCOUNTER — Telehealth (HOSPITAL_COMMUNITY): Payer: Self-pay

## 2021-12-06 NOTE — Progress Notes (Signed)
?PCP: Dr Radford Pax ?HF Cardiologist: Dr Vaughan Browner  ? ?HPI: ?[name] is a 71 y.o. with a history of squamous cell lung cancer LLL 1985, CAD, CABG x2 2013 , aortic stenosis, TAVR 2019, LBBB, St [employer] CRT-D, HTN, OSA, and chronic HFrEF 2019.  ?  ?Had RHC/LHC with Dr Haroldine Laws for TAVR work up.  ?  ?Admitted 08/30/21 with A/C HFrEF. Echo completed EF remains < 20%. Diuresed with IV lasix and transitioned to lasix 40 mg twice a day. Intolerant entresto due to hypotentsion.GDMT with losartan, toprol xl, and spiro. Discharged 09/03/21 with CPAP machine but doesn't know how to use it, weight 152 pounds.  ? ?Admitted 09/30/21 with volume overload. PICC placed with stable CO-OX. Diuresed with IV lasix and transitioned to torsemide 40 mg daily. BB cut back. D/C weight 140 pounds.  ? ?Follow up 3/23, volume mildly up, Farxiga restarted. CPX arranged showing moderate to severe HF limitation. ? ?Today he returns for HF follow up, here with Nira Conn w/ paramedicine. Overall feeling fine. He is not SOB with walking, carrying groceries or with work duties (works full time at [employer]). He has occasional positional dizziness but no falls. Denies palpitations, abnormal bleeding, CP, edema, or PND/Orthopnea. Appetite ok. No fever or chills. Weight at home 147 pounds. Taking all medications. He changed his torsemide to 20 mg PRN due to increased urination at work. ? ? Cardiac Testing  ?- CPX (3/23): Moderate to severe HF limitation w/ markedly elevated VE/VCO2 slope and blunt BP response to exercise ? ?FVC 3.17 (89%)      ?FEV1 2.54 (94%)        ?FEV1/FVC 80 (104%)        ?MVV 105 (83%)  ?      ?Peak RER: 1.09  ? ?Peak VO2: 15.0 (57% predicted peak VO2)  ?VE/VCO2 slope: 45 ? ? ?- Echo 1/23 EF < 20%  ? ?- Echo 2021 EF < 20%  ? ?- Echo 2020 EF 30% ?  ?- LHC/RHC 2019  ? Ost 1st Diag to 1st Diag lesion is 50% stenosed. ?Prox LAD to Mid LAD lesion is 40% stenosed. ?Ost RCA lesion is 100% stenosed. ?SVG and is normal in caliber and large. ?SVG and  is large. ?Ost Cx to Prox Cx lesion is 100% stenosed. ?Ost LAD to Prox LAD lesion is 40% stenosed. ? ?Ao =109/68 (86) ?LV = 117/29 ?RA = 19 ?RV = 54/17 ?PA = 62/21 (42) ?PCW = 40 ?Fick cardiac output/index = 3.5/1.78 ?PVR = 0.6 WU ?FA sat = 96% ?PA sat = 58%, 61% ?PAPi = 2.2 ?RA/PCWP = 0.475 ?CPO = 0.67 ?Av gradient: peak 15.0 mmHG  Mean 14.67mmHG  AVA = 0.89 cm2 ? ?ROS: All systems negative except as listed in HPI, PMH and Problem List. ? ?SH:  ?Social History  ? ?Socioeconomic History  ? Marital status: Married  ?  Spouse name: Not on file  ? Number of children: Not on file  ? Years of education: Not on file  ? Highest education level: Not on file  ?Occupational History  ? Occupation: security guard  ?  Comment: [employer]  ?Tobacco Use  ? Smoking status: Former  ?  Types: Cigarettes  ?  Quit date: 03/06/1985  ?  Years since quitting: 36.7  ? Smokeless tobacco: Never  ?Vaping Use  ? Vaping Use: Never used  ?Substance and Sexual Activity  ? Alcohol use: No  ? Drug use: No  ? Sexual activity: Not Currently  ?Other Topics Concern  ?  Not on file  ?Social History Narrative  ? Not on file  ? ?Social Determinants of Health  ? ?Financial Resource Strain: Low Risk   ? Difficulty of Paying Living Expenses: Not very hard  ?Food Insecurity: No Food Insecurity  ? Worried About Charity fundraiser in the Last Year: Never true  ? Ran Out of Food in the Last Year: Never true  ?Transportation Needs: No Transportation Needs  ? Lack of Transportation (Medical): No  ? Lack of Transportation (Non-Medical): No  ?Physical Activity: Not on file  ?Stress: Not on file  ?Social Connections: Not on file  ?Intimate Partner Violence: Not on file  ? ?FH:  ?Family History  ?Problem Relation Age of Onset  ? Diabetes Father   ? Diabetes Mother   ? Heart failure Mother   ? Hypertension Mother   ? Cancer Brother   ? Diabetes Sister   ? ?Past Medical History:  ?Diagnosis Date  ? AICD (automatic cardioverter/defibrillator) present 02/11/2020  ? Anemia   ?  Arthritis   ? Atrial tachycardia ([place])   ? Chronic systolic CHF (congestive heart failure) ([place]) 07/04/2018  ? CKD (chronic kidney disease), stage III ([place])   ? Coronary artery disease 02/2012  ? a. s/p stenting in 2013  b. s/p CABGx2V (SVG--> PDA, SVG--> LCx).  ? Diabetes mellitus   ? neuropathy  insulin dependent  ? Dilated aortic root ([place])   ? Dyslipidemia   ? Erectile dysfunction   ? GERD (gastroesophageal reflux disease)   ? History of CVA (cerebrovascular accident)   ? History of kidney stones   ? History of radiation therapy 12/03/2018-12/13/2018  ? left lung  Dr Gery Pray  ? Hx of radiation therapy to mediastinum 1985  ? Hypertension   ? Malignant seminoma of mediastinum ([place]) 1985  ? OSA (obstructive sleep apnea) 07/04/2018  ? Severe obstructive sleep apnea with an AHI of 30/h and mild central sleep apnea at 13.7/h with oxygen desaturations as low as 79%.  Intolerant to PAP therapy  ? S/P TAVR (transcatheter aortic valve replacement) 05/15/2018  ? 29 mm Edwards Sapien 3 transcatheter heart valve placed via percutaneous right transfemoral approach   ? Severe aortic stenosis   ? ?Current Outpatient Medications  ?Medication Sig Dispense Refill  ? amitriptyline (ELAVIL) 25 MG tablet Take 25 mg by mouth at bedtime.     ? aspirin EC 81 MG EC tablet Take 1 tablet (81 mg total) by mouth daily. Swallow whole. 30 tablet 11  ? B Complex-C (SUPER B COMPLEX PO) Take 1 tablet by mouth daily.    ? B-D ULTRAFINE III SHORT PEN 31G X 8 MM MISC 1 each by Other route daily as needed (GLUCOSE TESTING (INSULINE NEEDLE)).     ? dapagliflozin propanediol (FARXIGA) 10 MG TABS tablet Take 1 tablet (10 mg total) by mouth daily. 90 tablet 3  ? digoxin (LANOXIN) 0.125 MG tablet Take 1 tablet (0.125 mg total) by mouth daily. 30 tablet 3  ? insulin detemir (LEVEMIR) 100 UNIT/ML injection Inject 10-18 Units into the skin See admin instructions. 18 units in the morning, 10 units at bedtime    ? insulin lispro (HUMALOG) 100 UNIT/ML KwikPen  Inject 2-3 Units into the skin See admin instructions. Per sliding scale at Breakfast and Dinner    ? levothyroxine (SYNTHROID, LEVOTHROID) 50 MCG tablet Take 50 mcg by mouth daily before breakfast.     ? losartan (COZAAR) 25 MG tablet Take 0.5 tablets (12.5 mg total) by  mouth daily.    ? metFORMIN (GLUCOPHAGE) 500 MG tablet Take 2 tablets (1,000 mg total) by mouth 2 (two) times daily with a meal.    ? metoprolol succinate (TOPROL-XL) 100 MG 24 hr tablet Take 0.5 tablets (50 mg total) by mouth daily. Take with or immediately following a meal.    ? Multiple Vitamin (MULTIVITAMIN) tablet Take 1 tablet by mouth daily.    ? ONE TOUCH ULTRA TEST test strip 1 each by Other route as needed for other.     ? potassium chloride SA (KLOR-CON M) 20 MEQ tablet Take 1 tablet (20 mEq total) by mouth daily. 30 tablet 3  ? simvastatin (ZOCOR) 20 MG tablet Take 20 mg by mouth every evening.     ? spironolactone (ALDACTONE) 25 MG tablet Take 1 tablet (25 mg total) by mouth daily. 30 tablet 3  ? torsemide (DEMADEX) 20 MG tablet Take 20 mg by mouth as needed.    ? zolpidem (AMBIEN) 5 MG tablet Take 5 mg by mouth at bedtime as needed for sleep.    ? ?No current facility-administered medications for this encounter.  ? ?BP 118/68   Pulse 90   Wt 68.3 kg (150 lb 9.6 oz)   SpO2 99%   BMI 21.00 kg/m?  ? ?Wt Readings from Last 3 Encounters:  ?12/07/21 68.3 kg (150 lb 9.6 oz)  ?11/29/21 66.7 kg (147 lb)  ?11/18/21 67.3 kg (148 lb 6.4 oz)  ? ?PHYSICAL EXAM: ?General:  NAD. No resp difficulty ?HEENT: Normal ?Neck: Supple. No JVD. Carotids 2+ bilat; no bruits. No lymphadenopathy or thryomegaly appreciated. ?Cor: PMI nondisplaced. Regular rate & rhythm. No rubs, gallops or murmurs. ?Lungs: Clear ?Abdomen: Soft, nontender, nondistended. No hepatosplenomegaly. No bruits or masses. Good bowel sounds. ?Extremities: No cyanosis, clubbing, rash, edema ?Neuro: Alert & oriented x 3, cranial nerves grossly intact. Moves all 4 extremities w/o difficulty.  Affect pleasant. ? ?Device interrogation (personally reviewed): CoreVue recently elevated, but thoracic impedence now at baseline,  8 hrs day/activity, 3 episodes of AT/AF lasting < 20 seconds (overall <

## 2021-12-06 NOTE — Telephone Encounter (Signed)
Confirmed appointment for tomorrow in clinic at 0930.  ?

## 2021-12-07 ENCOUNTER — Other Ambulatory Visit (HOSPITAL_COMMUNITY): Payer: Self-pay

## 2021-12-07 ENCOUNTER — Encounter (HOSPITAL_COMMUNITY): Payer: Self-pay

## 2021-12-07 ENCOUNTER — Ambulatory Visit (HOSPITAL_COMMUNITY)
Admission: RE | Admit: 2021-12-07 | Discharge: 2021-12-07 | Disposition: A | Discharge status: Home / Self Care | Payer: Medicare Other | Source: Ambulatory Visit | Attending: Family Medicine | Admitting: Family Medicine

## 2021-12-07 VITALS — BP 118/68 | HR 90 | Wt 150.6 lb

## 2021-12-07 DIAGNOSIS — I10 Essential (primary) hypertension: Secondary | ICD-10-CM | POA: Diagnosis not present

## 2021-12-07 DIAGNOSIS — I5022 Chronic systolic (congestive) heart failure: Secondary | ICD-10-CM

## 2021-12-07 DIAGNOSIS — I5023 Acute on chronic systolic (congestive) heart failure: Secondary | ICD-10-CM

## 2021-12-07 DIAGNOSIS — I2510 Atherosclerotic heart disease of native coronary artery without angina pectoris: Secondary | ICD-10-CM

## 2021-12-07 DIAGNOSIS — Z952 Presence of prosthetic heart valve: Secondary | ICD-10-CM | POA: Diagnosis not present

## 2021-12-07 LAB — BRAIN NATRIURETIC PEPTIDE: B Natriuretic Peptide: 965.4 pg/mL — ABNORMAL HIGH (ref 0.0–100.0)

## 2021-12-07 LAB — BASIC METABOLIC PANEL
Anion gap: 7 (ref 5–15)
BUN: 17 mg/dL (ref 8–23)
CO2: 26 mmol/L (ref 22–32)
Calcium: 9.2 mg/dL (ref 8.9–10.3)
Chloride: 104 mmol/L (ref 98–111)
Creatinine, Ser: 1.2 mg/dL (ref 0.61–1.24)
GFR, Estimated: >60 mL/min (ref >60)
Glucose, Bld: 200 mg/dL — ABNORMAL HIGH (ref 70–99)
Potassium: 4.8 mmol/L (ref 3.5–5.1)
Sodium: 137 mmol/L (ref 135–145)

## 2021-12-07 LAB — DIGOXIN LEVEL: Digoxin Level: 0.9 ng/mL (ref 0.8–2.0)

## 2021-12-07 NOTE — Patient Instructions (Signed)
Thank you for coming in today ? ?Labs were done today, if any labs are abnormal the clinic will call you ?No news is good news ? ?Your physician recommends that you schedule a follow-up appointment in:  ?2-3 months with Dr. Haroldine Laws ? ?At the [facility], you and your health needs are our priority. As part of our continuing mission to provide you with exceptional heart care, we have created designated Provider Care Teams. These Care Teams include your primary Cardiologist (physician) and Advanced Practice Providers (APPs- Physician Assistants and Nurse Practitioners) who all work together to provide you with the care you need, when you need it.  ? ?You may see any of the following providers on your designated Care Team at your next follow up: ?Dr Glori Bickers ?Dr Loralie Champagne ?Darrick Grinder, NP ?Lyda Jester, PA ?Jessica Milford,NP ?Marlyce Huge, PA ?Audry Riles, PharmD ? ? ?Please be sure to bring in all your medications bottles to every appointment.  ? ?If you have any questions or concerns before your next appointment please send [place] a message through [place] or call our office at [phone].   ? ?TO LEAVE A MESSAGE FOR THE NURSE SELECT OPTION 2, PLEASE LEAVE A MESSAGE INCLUDING: ?YOUR NAME ?DATE OF BIRTH ?CALL BACK NUMBER ?REASON FOR CALL**this is important as we prioritize the call backs ? ?YOU WILL RECEIVE A CALL BACK THE SAME DAY AS LONG AS YOU CALL BEFORE 4:00 PM ? ?

## 2021-12-07 NOTE — Progress Notes (Signed)
Paramedicine Encounter ? ? ? Patient ID: [name], male    DOB: 25-Dec-1950, 71 y.o.   MRN: [id number] ? ?Met with [name] in clinic today where he was seen by Dr. Haroldine Laws and Allena Katz NP. [place] reports feeling good with no complaints. He stated that he had one episode recently of dizziness but denied chest pain, shortness of breath. He did report having some intermittent swelling in his lower legs but mostly controlled. He continues to take his Torsemide as needed.  ? ? ?CPX results reviewed with Dr. Haroldine Laws.  ? ? ?Close follow up recommended. I will plan to see him weekly in the home. He agreed.  ? ?Labs obtained today- stable no changes.  ? ?I plan to follow up next week on Monday.  ? ?Clinic visit complete.  ? ? ? ? ?ACTION: ?Home visit completed ? ? ? ? ? ? ?

## 2021-12-08 ENCOUNTER — Telehealth (HOSPITAL_COMMUNITY): Payer: Self-pay | Admitting: Surgery

## 2021-12-08 NOTE — Telephone Encounter (Signed)
-----   Message from Rafael Bihari, [place] sent at 12/08/2021  8:08 AM EDT ----- ?Dig level too high (he had taken his digoxin before labs drawn). Please repeat as a trough in a week or so ?

## 2021-12-08 NOTE — Telephone Encounter (Signed)
Attempted to reach patient to review results and recommendations per provider.  I left a message to request that he call back. ?

## 2021-12-13 ENCOUNTER — Telehealth (HOSPITAL_COMMUNITY): Payer: Self-pay

## 2021-12-13 NOTE — Telephone Encounter (Signed)
[name] contacted me and reported he was called into work early today and would need to cancel our scheduled home visit and reschedule for Thursday morning. Call complete. [name] scheduled for Thursday at 830.  ?

## 2021-12-16 ENCOUNTER — Other Ambulatory Visit: Payer: Self-pay | Admitting: Cardiology

## 2021-12-16 ENCOUNTER — Other Ambulatory Visit (HOSPITAL_COMMUNITY): Payer: Self-pay

## 2021-12-16 ENCOUNTER — Telehealth (HOSPITAL_COMMUNITY): Payer: Self-pay

## 2021-12-16 DIAGNOSIS — I5022 Chronic systolic (congestive) heart failure: Secondary | ICD-10-CM

## 2021-12-16 DIAGNOSIS — I5023 Acute on chronic systolic (congestive) heart failure: Secondary | ICD-10-CM

## 2021-12-16 NOTE — Telephone Encounter (Signed)
Lmtrc, letter mailed x3rd attempt.  ?

## 2021-12-16 NOTE — Telephone Encounter (Signed)
This is a CHF pt 

## 2021-12-16 NOTE — Telephone Encounter (Signed)
Texted [name] to remind him to call clinic tomorrow to set up appointment for repeat labs. If he has not by Monday I will assist him in scheduling same.  ?

## 2021-12-16 NOTE — Progress Notes (Signed)
Paramedicine Encounter ? ? ? Patient ID: [name], male    DOB: [birth year], 71 y.o.   MRN: [id number] ? ?Arrived for home visit for [name] who reports he is feeling good with no complaints. He denied chest pain, dizziness or shortness of breath over the last week. He reports he has not needed to take any fluid pills over the last week either. He has been compliant with all medications. I verified medications and confirmed same.  ? ?We discussed his eating habits and he reports he eats a lot of frozen meals, fast food, cafeteria food at his work. He states he does not cook unless it's in the microwave. He reported that he also does not like to eat many vegetables. I discussed mom meals with him and he is interested. I will reach out to [facility] to see if this would be applicable for him.  ? ?We reviewed upcoming appointments. He reports he will be at same.  ? ?I plan to come out to see [name] in two weeks. He agreed with plan. Home visit complete.  ? ?Patient Care Team: ?[name], MD as PCP - General (Family Medicine) ?[name], MD as PCP - Cardiology (Cardiology) ?Thompson Grayer, MD as PCP - Electrophysiology (Cardiology) ? ?Patient Active Problem List  ? Diagnosis Date Noted  ? Hypothyroidism 09/30/2021  ? Acute renal failure superimposed on stage 3a chronic kidney disease ([place]) 09/30/2021  ? Elevated troponin 09/30/2021  ? Normal anion gap metabolic acidosis 62/95/2841  ? Acute on chronic systolic (congestive) heart failure ([place]) 09/30/2021  ? Acute on chronic systolic CHF (congestive heart failure) ([place]) 08/30/2021  ? Ischemic cardiomyopathy 02/11/2020  ? Left bundle branch block 01/08/2020  ? Squamous cell carcinoma of bronchus in left lower lobe ([place]) 11/22/2018  ? Nodule of lower lobe of left lung 10/27/2018  ? OSA (obstructive sleep apnea) 07/04/2018  ? Chronic systolic CHF (congestive heart failure) ([place]) 07/04/2018  ? S/P TAVR (transcatheter aortic valve replacement) 05/15/2018  ? History of CVA  (cerebrovascular accident)   ? Cardiomyopathy ([place]) 11/11/2016  ? Aortic stenosis, severe 06/27/2014  ? AAA (abdominal aortic aneurysm) ([place])   ? Insomnia   ? Hypertension   ? SOB (shortness of breath)   ? Insulin dependent type 2 diabetes mellitus ([place]) 12/28/2012  ? Dyslipidemia 12/28/2012  ? CAD (coronary artery disease), native coronary artery 02/13/2012  ? Hx of radiation therapy to mediastinum 08/16/1983  ? Malignant seminoma of mediastinum ([place]) 08/16/1983  ? ? ?Current Outpatient Medications:  ?  amitriptyline (ELAVIL) 25 MG tablet, Take 25 mg by mouth at bedtime. , Disp: , Rfl:  ?  aspirin EC 81 MG EC tablet, Take 1 tablet (81 mg total) by mouth daily. Swallow whole., Disp: 30 tablet, Rfl: 11 ?  B Complex-C (SUPER B COMPLEX PO), Take 1 tablet by mouth daily., Disp: , Rfl:  ?  B-D ULTRAFINE III SHORT PEN 31G X 8 MM MISC, 1 each by Other route daily as needed (GLUCOSE TESTING (INSULINE NEEDLE)). , Disp: , Rfl:  ?  dapagliflozin propanediol (FARXIGA) 10 MG TABS tablet, Take 1 tablet (10 mg total) by mouth daily., Disp: 90 tablet, Rfl: 3 ?  digoxin (LANOXIN) 0.125 MG tablet, Take 1 tablet (0.125 mg total) by mouth daily., Disp: 30 tablet, Rfl: 3 ?  insulin detemir (LEVEMIR) 100 UNIT/ML injection, Inject 10-18 Units into the skin See admin instructions. 18 units in the morning, 10 units at bedtime, Disp: , Rfl:  ?  insulin lispro (  HUMALOG) 100 UNIT/ML KwikPen, Inject 2-3 Units into the skin See admin instructions. Per sliding scale at Breakfast and Dinner, Disp: , Rfl:  ?  levothyroxine (SYNTHROID, LEVOTHROID) 50 MCG tablet, Take 50 mcg by mouth daily before breakfast. , Disp: , Rfl:  ?  losartan (COZAAR) 25 MG tablet, Take 0.5 tablets (12.5 mg total) by mouth daily., Disp: , Rfl:  ?  metFORMIN (GLUCOPHAGE) 500 MG tablet, Take 2 tablets (1,000 mg total) by mouth 2 (two) times daily with a meal., Disp: , Rfl:  ?  metoprolol succinate (TOPROL-XL) 100 MG 24 hr tablet, Take 0.5 tablets (50 mg total) by mouth daily.  Take with or immediately following a meal., Disp: , Rfl:  ?  Multiple Vitamin (MULTIVITAMIN) tablet, Take 1 tablet by mouth daily., Disp: , Rfl:  ?  ONE TOUCH ULTRA TEST test strip, 1 each by Other route as needed for other. , Disp: , Rfl:  ?  potassium chloride SA (KLOR-CON M) 20 MEQ tablet, Take 1 tablet (20 mEq total) by mouth daily., Disp: 30 tablet, Rfl: 3 ?  simvastatin (ZOCOR) 20 MG tablet, Take 20 mg by mouth every evening. , Disp: , Rfl:  ?  spironolactone (ALDACTONE) 25 MG tablet, Take 1 tablet (25 mg total) by mouth daily., Disp: 30 tablet, Rfl: 3 ?  torsemide (DEMADEX) 20 MG tablet, Take 20 mg by mouth as needed., Disp: , Rfl:  ?  zolpidem (AMBIEN) 5 MG tablet, Take 5 mg by mouth at bedtime as needed for sleep., Disp: , Rfl:  ?Allergies  ?Allergen Reactions  ? Atorvastatin Other (See Comments)  ?  Muscle pain ?Other reaction(s): cramps ?Other reaction(s): cramps  ? Entresto [Sacubitril-Valsartan] Other (See Comments)  ?  hypotension  ? Adhesive [Tape] Rash  ? ? ? ?Social History  ? ?Socioeconomic History  ? Marital status: Married  ?  Spouse name: Not on file  ? Number of children: Not on file  ? Years of education: Not on file  ? Highest education level: Not on file  ?Occupational History  ? Occupation: security guard  ?  Comment: [employer]  ?Tobacco Use  ? Smoking status: Former  ?  Types: Cigarettes  ?  Quit date: 03/06/1985  ?  Years since quitting: 36.8  ? Smokeless tobacco: Never  ?Vaping Use  ? Vaping Use: Never used  ?Substance and Sexual Activity  ? Alcohol use: No  ? Drug use: No  ? Sexual activity: Not Currently  ?Other Topics Concern  ? Not on file  ?Social History Narrative  ? Not on file  ? ?Social Determinants of Health  ? ?Financial Resource Strain: Low Risk   ? Difficulty of Paying Living Expenses: Not very hard  ?Food Insecurity: No Food Insecurity  ? Worried About Charity fundraiser in the Last Year: Never true  ? Ran Out of Food in the Last Year: Never true  ?Transportation Needs: No  Transportation Needs  ? Lack of Transportation (Medical): No  ? Lack of Transportation (Non-Medical): No  ?Physical Activity: Not on file  ?Stress: Not on file  ?Social Connections: Not on file  ?Intimate Partner Violence: Not on file  ? ? ?Physical Exam ?Vitals reviewed.  ?Constitutional:   ?   Appearance: Normal appearance. He is normal weight.  ?HENT:  ?   Head: Normocephalic.  ?   Nose: Nose normal.  ?   Mouth/Throat:  ?   Mouth: Mucous membranes are moist.  ?   Pharynx: Oropharynx is clear.  ?  Eyes:  ?   Conjunctiva/sclera: Conjunctivae normal.  ?   Pupils: Pupils are equal, round, and reactive to light.  ?Cardiovascular:  ?   Rate and Rhythm: Normal rate and regular rhythm.  ?   Pulses: Normal pulses.  ?   Heart sounds: Normal heart sounds.  ?Pulmonary:  ?   Effort: Pulmonary effort is normal. No respiratory distress.  ?   Breath sounds: Normal breath sounds. No stridor. No wheezing, rhonchi or rales.  ?Abdominal:  ?   General: Abdomen is flat.  ?   Palpations: Abdomen is soft.  ?Musculoskeletal:     ?   General: No swelling. Normal range of motion.  ?   Cervical back: Normal range of motion.  ?   Right lower leg: No edema.  ?   Left lower leg: No edema.  ?Skin: ?   General: Skin is warm and dry.  ?   Capillary Refill: Capillary refill takes less than 2 seconds.  ?Neurological:  ?   General: No focal deficit present.  ?   Mental Status: He is alert. Mental status is at baseline.  ?Psychiatric:     ?   Mood and Affect: Mood normal.  ? ? ? ? ? ? ?Future Appointments  ?Date Time Provider [place]  ?12/27/2021  7:10 AM [facility] DEVICE REMOTES [facility]  ?02/09/2022  7:10 AM [facility] DEVICE REMOTES [facility]  ?03/08/2022 10:20 AM Bensimhon, Shaune Pascal, MD [facility] None  ?05/11/2022  7:10 AM [facility]ICE REMOTES [facility]  ?05/26/2022 11:15 AM Gery Pray, MD [facility] None  ?08/10/2022  7:10 AM [facility] DEVICE REMOTES [facility]   ? ? ? ?ACTION: ?Home visit completed ? ? ? ? ? ? ?

## 2021-12-16 NOTE — Telephone Encounter (Signed)
-----   Message from Rafael Bihari, [place] sent at 12/08/2021  8:08 AM EDT ----- ?Dig level too high (he had taken his digoxin before labs drawn). Please repeat as a trough in a week or so ?

## 2021-12-17 ENCOUNTER — Telehealth (HOSPITAL_COMMUNITY): Payer: Self-pay | Admitting: Licensed Clinical Social Worker

## 2021-12-17 NOTE — Addendum Note (Signed)
Addended by: Scarlette Calico on: 12/17/2021 10:59 AM ? ? Modules accepted: Orders ? ?

## 2021-12-17 NOTE — Telephone Encounter (Signed)
Pt called back and sch lab appt for Mon 5/8, order placed, advised to not take dig before coming ?

## 2021-12-17 NOTE — Telephone Encounter (Signed)
Patient identified as a candidate to receive 7 heart healthy meals per week for 4 weeks through [facility].  Completed referral sent in for review.  Anticipate patient will receive first shipment of food in 1-3 business days.  [name] H. Carols Clemence, LCSW Clinical Social Worker [facility] Desk#: [phone] Cell#: [phone]  

## 2021-12-20 ENCOUNTER — Other Ambulatory Visit (HOSPITAL_COMMUNITY): Payer: Medicare Other

## 2021-12-20 ENCOUNTER — Telehealth (HOSPITAL_COMMUNITY): Payer: Self-pay

## 2021-12-20 NOTE — Telephone Encounter (Signed)
Spoke to [name] and he reports he did take his Digoxin this morning prior to coming in for labs, the HF clinic wanted him to hold this before labs. I called clinic and spoke to [facility] who reports he can come tomorrow morning at 9:15 and to have him not take this digoxin. I made Deston aware and he agreed with plan and is scheduled for Digoxin Labs tomorrow at 9:15.  ?

## 2021-12-21 ENCOUNTER — Ambulatory Visit (HOSPITAL_COMMUNITY)
Admission: RE | Admit: 2021-12-21 | Discharge: 2021-12-21 | Disposition: A | Discharge status: Home / Self Care | Payer: Medicare Other | Source: Ambulatory Visit | Attending: Cardiology | Admitting: Cardiology

## 2021-12-21 DIAGNOSIS — I5022 Chronic systolic (congestive) heart failure: Secondary | ICD-10-CM

## 2021-12-21 LAB — DIGOXIN LEVEL: Digoxin Level: 0.6 ng/mL — ABNORMAL LOW (ref 0.8–2.0)

## 2021-12-24 ENCOUNTER — Other Ambulatory Visit: Payer: Self-pay | Admitting: Internal Medicine

## 2021-12-24 DIAGNOSIS — H5212 Myopia, left eye: Secondary | ICD-10-CM | POA: Diagnosis not present

## 2021-12-24 DIAGNOSIS — H5201 Hypermetropia, right eye: Secondary | ICD-10-CM | POA: Diagnosis not present

## 2021-12-24 DIAGNOSIS — E113593 Type 2 diabetes mellitus with proliferative diabetic retinopathy without macular edema, bilateral: Secondary | ICD-10-CM | POA: Diagnosis not present

## 2021-12-27 ENCOUNTER — Ambulatory Visit (INDEPENDENT_AMBULATORY_CARE_PROVIDER_SITE_OTHER): Payer: Medicare Other

## 2021-12-27 DIAGNOSIS — I5022 Chronic systolic (congestive) heart failure: Secondary | ICD-10-CM

## 2021-12-27 DIAGNOSIS — Z95810 Presence of automatic (implantable) cardiac defibrillator: Secondary | ICD-10-CM

## 2021-12-28 NOTE — Progress Notes (Signed)
EPIC Encounter for ICM Monitoring ? ?Patient Name: KYEN TAITE is a 71 y.o. male ?Date: 12/28/2021 ?Primary Care Physican: [name], MD ?Primary Cardiologist: Turner/Bensimohn ?Electrophysiologist: Allred ?10/20/2021 Weight: 140 lbs ?11/02/2021 Weight: 144 - 145 lbs ?12/28/2021 Weight: 152 lbs ?                                                         ?  ?Spoke with patient and heart failure questions reviewed.  Pt asymptomatic for fluid accumulation.  Reports feeling well at this time and voices no complaints.  ?  ?CorVue thoracic impedance suggesting normal fluid levels.  ?  ?Prescribed: ?Torsemide 20 mg take 2 tablets (40 mg total) daily.  12/28/2021  Pt reports taking Furosemide 1 tablet daily instead of 2. ?Potassium 20 mEq take 1 tablet by mouth daily.  ?Spironolactone 25 mg take 1 tablet by mouth daily. ?Farxiga 10 mg take 1 tablet by mouth daily. ?  ?Labs: ?12/07/2021 Creatinine 1.20, BUN 17, Potassium 4.8, Sodium 137, GFR >60 ?10/21/2021 Creatinine 1.18, BUN 21, Potassium 4.0, Sodium 137, GFR >60 ?10/05/2021 Creatinine 1.52, BUN 30, Potassium 4.1, Sodium 136, GFR 48 ?10/04/2021 Creatinine 1.59, BUN 34, Potassium 4.1, Sodium 138, GFR 46  ?10/03/2021 Creatinine 1.35, BUN 25, Potassium 3.7, Sodium 141, GFR 56  ?10/02/2021 Creatinine 1.41, BUN 28, Potassium 3.0, Sodium 140, GFR 53  ?10/01/2021 Creatinine 1.65, BUN 30, Potassium 4.1, Sodium 138, GFR 44  ?09/30/2021 Creatinine 1.66, BUN 29, Potassium 3.8, Sodium 138, GFR 44  ?A complete set of results can be found in Results Review. ?  ?Recommendations:  No changes and encouraged to call if experiencing any fluid symptoms. ?  ?Follow-up plan: ICM clinic phone appointment on 01/31/2022.  91 day device clinic remote transmission 02/09/2022.   ?  ?EP/Cardiology Office Visits:  03/08/2022 with [facility].   Recall 05/14/2021 with Dr Rayann Heman.   ?  ?Copy of ICM check sent to Dr. Rayann Heman. ? ?3 month ICM trend: 12/27/2021. ? ? ? ?12-14 Month ICM trend:  ? ? ? ?[name], RN ?12/28/2021 ?4:17 PM ? ?

## 2021-12-31 DIAGNOSIS — Z953 Presence of xenogenic heart valve: Secondary | ICD-10-CM | POA: Diagnosis not present

## 2021-12-31 DIAGNOSIS — D500 Iron deficiency anemia secondary to blood loss (chronic): Secondary | ICD-10-CM | POA: Diagnosis not present

## 2021-12-31 DIAGNOSIS — E113599 Type 2 diabetes mellitus with proliferative diabetic retinopathy without macular edema, unspecified eye: Secondary | ICD-10-CM | POA: Diagnosis not present

## 2021-12-31 DIAGNOSIS — I10 Essential (primary) hypertension: Secondary | ICD-10-CM | POA: Diagnosis not present

## 2021-12-31 DIAGNOSIS — N1831 Chronic kidney disease, stage 3a: Secondary | ICD-10-CM | POA: Diagnosis not present

## 2021-12-31 DIAGNOSIS — I2510 Atherosclerotic heart disease of native coronary artery without angina pectoris: Secondary | ICD-10-CM | POA: Diagnosis not present

## 2021-12-31 DIAGNOSIS — E7800 Pure hypercholesterolemia, unspecified: Secondary | ICD-10-CM | POA: Diagnosis not present

## 2021-12-31 DIAGNOSIS — E039 Hypothyroidism, unspecified: Secondary | ICD-10-CM | POA: Diagnosis not present

## 2021-12-31 DIAGNOSIS — I5022 Chronic systolic (congestive) heart failure: Secondary | ICD-10-CM | POA: Diagnosis not present

## 2022-01-03 ENCOUNTER — Telehealth (HOSPITAL_COMMUNITY): Payer: Self-pay

## 2022-01-03 NOTE — Telephone Encounter (Signed)
Spoke to [name] to set up home visit for Thursday morning at [place]. Call complete.

## 2022-01-06 ENCOUNTER — Other Ambulatory Visit (HOSPITAL_COMMUNITY): Payer: Self-pay

## 2022-01-06 NOTE — Progress Notes (Signed)
Paramedicine Encounter    Patient ID: [name], male    DOB: 1951-05-16, 71 y.o.   MRN: [id number]   Arrived for home visit for [name] who reports feeling good with no complaints. He states he has had no dizziness, shortness of breath or chest pain. He has been med compliant and has not missed any doses. He continues to weigh daily. Weight today is 145lbs.   He states he did take a fluid pill last week after gaining 5 lbs over two days due to food choices. He denied chest pain or shortness of breath and states he did not note any swelling either.   He received Moms Meals and really likes having them. He plans to call to set up next shipment.   I reviewed medications. He needs no refills at this time.   He reports he is walking about 75 years to get to and from his car before and after work and he has to take a few breaks. He reports this is due to fatigue not necessarily shortness of breath. He denied chest pain or dizziness while walking. I suggested a handicap placard and he agreed and was grateful for the suggestion. I obtained form signed by MD at HF clinic for him to take to plate office.    We reviewed upcoming appointments.   Vitals as noted.   He agreed to visits every 2-3 weeks for follow up.   Social Concerns- He reports to me his divorce will be final in November and he will have to move out of the home him and his wife shared. He is living there currently but reports he is actively looking for other housing. He reports [place] would [place] outside the city [place] and can afford $1000/ month. I will be looking for him also.   Home visit complete. I will follow up in 3 weeks.   Patient Care Team: [name], MD as PCP - General (Family Medicine) [name], MD as PCP - Cardiology (Cardiology) Thompson Grayer, MD as PCP - Electrophysiology (Cardiology)  Patient Active Problem List   Diagnosis Date Noted   Hypothyroidism 09/30/2021   Acute renal failure  superimposed on stage 3a chronic kidney disease ([place]) 09/30/2021   Elevated troponin 09/30/2021   Normal anion gap metabolic acidosis 07/37/1062   Acute on chronic systolic (congestive) heart failure ([place]) 09/30/2021   Acute on chronic systolic CHF (congestive heart failure) ([place]) 08/30/2021   Ischemic cardiomyopathy 02/11/2020   Left bundle branch block 01/08/2020   Squamous cell carcinoma of bronchus in left lower lobe ([place]) 11/22/2018   Nodule of lower lobe of left lung 10/27/2018   OSA (obstructive sleep apnea) 69/48/5462   Chronic systolic CHF (congestive heart failure) ([place]) 07/04/2018   S/P TAVR (transcatheter aortic valve replacement) 05/15/2018   History of CVA (cerebrovascular accident)    Cardiomyopathy ([place]) 11/11/2016   Aortic stenosis, severe 06/27/2014   AAA (abdominal aortic aneurysm) ([facility])    Insomnia    Hypertension    SOB (shortness of breath)    Insulin dependent type 2 diabetes mellitus ([place]) 12/28/2012   Dyslipidemia 12/28/2012   CAD (coronary artery disease), native coronary artery 02/13/2012   Hx of radiation therapy to mediastinum 08/16/1983   Malignant seminoma of mediastinum ([place]) 08/16/1983    Current Outpatient Medications:    amitriptyline (ELAVIL) 25 MG tablet, Take 25 mg by mouth at bedtime. , Disp: , Rfl:    aspirin EC 81 MG EC tablet, Take 1  tablet (81 mg total) by mouth daily. Swallow whole., Disp: 30 tablet, Rfl: 11   B Complex-C (SUPER B COMPLEX PO), Take 1 tablet by mouth daily., Disp: , Rfl:    B-D ULTRAFINE III SHORT PEN 31G X 8 MM MISC, 1 each by Other route daily as needed (GLUCOSE TESTING (INSULINE NEEDLE)). , Disp: , Rfl:    dapagliflozin propanediol (FARXIGA) 10 MG TABS tablet, Take 1 tablet (10 mg total) by mouth daily., Disp: 90 tablet, Rfl: 3   digoxin (LANOXIN) 0.125 MG tablet, Take 1 tablet (0.125 mg total) by mouth daily., Disp: 30 tablet, Rfl: 3   insulin detemir (LEVEMIR) 100 UNIT/ML injection, Inject 10-18 Units into the skin See  admin instructions. 18 units in the morning, 10 units at bedtime, Disp: , Rfl:    insulin lispro (HUMALOG) 100 UNIT/ML KwikPen, Inject 2-3 Units into the skin See admin instructions. Per sliding scale at Breakfast and Dinner, Disp: , Rfl:    levothyroxine (SYNTHROID, LEVOTHROID) 50 MCG tablet, Take 50 mcg by mouth daily before breakfast. , Disp: , Rfl:    losartan (COZAAR) 25 MG tablet, TAKE 1 TABLET(25 MG) BY MOUTH DAILY, Disp: 90 tablet, Rfl: 3   metFORMIN (GLUCOPHAGE) 500 MG tablet, Take 2 tablets (1,000 mg total) by mouth 2 (two) times daily with a meal., Disp: , Rfl:    metoprolol succinate (TOPROL-XL) 100 MG 24 hr tablet, Take 0.5 tablets (50 mg total) by mouth daily. Take with or immediately following a meal., Disp: , Rfl:    Multiple Vitamin (MULTIVITAMIN) tablet, Take 1 tablet by mouth daily., Disp: , Rfl:    ONE TOUCH ULTRA TEST test strip, 1 each by Other route as needed for other. , Disp: , Rfl:    potassium chloride SA (KLOR-CON M) 20 MEQ tablet, Take 1 tablet (20 mEq total) by mouth daily., Disp: 30 tablet, Rfl: 3   simvastatin (ZOCOR) 20 MG tablet, Take 20 mg by mouth every evening. , Disp: , Rfl:    spironolactone (ALDACTONE) 25 MG tablet, Take 1 tablet (25 mg total) by mouth daily., Disp: 30 tablet, Rfl: 3   torsemide (DEMADEX) 20 MG tablet, Take 20 mg by mouth as needed., Disp: , Rfl:    zolpidem (AMBIEN) 5 MG tablet, Take 5 mg by mouth at bedtime as needed for sleep., Disp: , Rfl:  Allergies  Allergen Reactions   Atorvastatin Other (See Comments)    Muscle pain Other reaction(s): cramps Other reaction(s): cramps   Entresto [Sacubitril-Valsartan] Other (See Comments)    hypotension   Adhesive [Tape] Rash     Social History   Socioeconomic History   Marital status: Married    Spouse name: Not on file   Number of children: Not on file   Years of education: Not on file   Highest education level: Not on file  Occupational History   Occupation: security guard     Comment: [employer]  Tobacco Use   Smoking status: Former    Types: Cigarettes    Quit date: 03/06/1985    Years since quitting: 36.8   Smokeless tobacco: Never  Vaping Use   Vaping Use: Never used  Substance and Sexual Activity   Alcohol use: No   Drug use: No   Sexual activity: Not Currently  Other Topics Concern   Not on file  Social History Narrative   Not on file   Social Determinants of Health   Financial Resource Strain: Low Risk    Difficulty of Paying Living Expenses:  Not very hard  Food Insecurity: No Food Insecurity   Worried About Charity fundraiser in the Last Year: Never true   Ran Out of Food in the Last Year: Never true  Transportation Needs: No Transportation Needs   Lack of Transportation (Medical): No   Lack of Transportation (Non-Medical): No  Physical Activity: Not on file  Stress: Not on file  Social Connections: Not on file  Intimate Partner Violence: Not on file    Physical Exam      Future Appointments  Date Time Provider [place]  01/31/2022  7:05 AM [facility] DEVICE [facility]OTES [facility]  02/09/2022  7:10 AM [facility] DEVICE REMOTES [facility]  03/08/2022 10:20 AM Bensimhon, Shaune Pascal, MD [facility] None  05/11/2022  7:10 AM [facility] DEVICE REMOTES [facility]  05/26/2022 11:15 AM Gery Pray, MD [facility] None  08/10/2022  7:10 AM [facility] DEVICE REMOTES [facility]     ACTION: Home visit completed

## 2022-01-19 ENCOUNTER — Telehealth (HOSPITAL_COMMUNITY): Payer: Self-pay

## 2022-01-19 NOTE — Telephone Encounter (Signed)
Attempted to reach [name] to schedule but no answer. I left a voicemail and will follow up.   [name], [place] 01/19/2022

## 2022-01-24 ENCOUNTER — Other Ambulatory Visit (HOSPITAL_COMMUNITY): Payer: Self-pay

## 2022-01-24 NOTE — Progress Notes (Signed)
Paramedicine Encounter    Patient ID: [name], male    DOB: [birth year], 71 y.o.   MRN: [id number]   Arrived for home visit for [name] who reports he has been feeling overall okay but states that he has noticed some swelling in his ankles over the last two days and stated over the last week he has had some wheezing and reports some trouble breathing when laying down at night. He reports he uses two pillows at night and states that normally he does not have a hard time sleeping or issues with shortness of breath. He denied chest pain and dizziness.    He states he has not used his Torsemide over the last week. I advised him to be sure to take one today to start getting some of the fluid build up off of him. His prescription is for one as needed. He plans to take one today and tomorrow. I plan on checking back in with him on Wednesday, he will be seeing his PCP that morning at 0830. I advised him to ask if they can draw his labs while he is there and have them forwarded to Dr. Clayborne Dana office.   On assessment he had some swelling in the lower ankles and tops of feet. He does have some wheezing in all four lobes upon assessment. He denied chest pain, dizziness or trouble breathing at present.   He is taking all other medications correctly daily.   CBG is 371 today, he reports he just ate breakfast before my arrival and he just took his insulin at 0845. He says he has not been having issues with high or low blood sugars as of late.   I advised [name] to make sure he uses his Torsemide and to make sure he is limiting his sodium intake and fluid intake daily. He agreed with plan and I will check in on Wednesday over the phone to see how he is feeling and I plan to see him in one week in the home for follow up and full home visit.   Refills: Donne Anon, [place] 01/24/2022     Patient Care Team: [name], MD as PCP - General (Family Medicine) [name], MD as PCP - Cardiology (Cardiology) Thompson Grayer, MD as PCP - Electrophysiology (Cardiology)  Patient Active Problem List   Diagnosis Date Noted   Hypothyroidism 09/30/2021   Acute renal failure superimposed on stage 3a chronic kidney disease ([place]) 09/30/2021   Elevated troponin 09/30/2021   Normal anion gap metabolic acidosis 76/19/5093   Acute on chronic systolic (congestive) heart failure ([place]) 09/30/2021   Acute on chronic systolic CHF (congestive heart failure) ([place]) 08/30/2021   Ischemic cardiomyopathy 02/11/2020   Left bundle branch block 01/08/2020   Squamous cell carcinoma of bronchus in left lower lobe ([place]) 11/22/2018   Nodule of lower lobe of left lung 10/27/2018   OSA (obstructive sleep apnea) 26/71/2458   Chronic systolic CHF (congestive heart failure) ([place]) 07/04/2018   S/P TAVR (transcatheter aortic valve replacement) 05/15/2018   History of CVA (cerebrovascular accident)    Cardiomyopathy ([place]) 11/11/2016   Aortic stenosis, severe 06/27/2014   AAA (abdominal aortic aneurysm) ([facility])    Insomnia    Hypertension    SOB (shortness of breath)    Insulin dependent type 2 diabetes mellitus ([place]) 12/28/2012   Dyslipidemia 12/28/2012   CAD (coronary artery disease), native coronary artery 02/13/2012   Hx of radiation therapy to mediastinum 08/16/1983  Malignant seminoma of mediastinum ([place]) 08/16/1983    Current Outpatient Medications:    amitriptyline (ELAVIL) 25 MG tablet, Take 25 mg by mouth at bedtime. , Disp: , Rfl:    aspirin EC 81 MG EC tablet, Take 1 tablet (81 mg total) by mouth daily. Swallow whole., Disp: 30 tablet, Rfl: 11   B Complex-C (SUPER B COMPLEX PO), Take 1 tablet by mouth daily., Disp: , Rfl:    B-D ULTRAFINE III SHORT PEN 31G X 8 MM MISC, 1 each by Other route daily as needed (GLUCOSE TESTING (INSULINE NEEDLE)). , Disp: , Rfl:    dapagliflozin propanediol (FARXIGA) 10 MG TABS tablet, Take 1 tablet (10 mg total) by mouth daily., Disp: 90  tablet, Rfl: 3   digoxin (LANOXIN) 0.125 MG tablet, Take 1 tablet (0.125 mg total) by mouth daily., Disp: 30 tablet, Rfl: 3   insulin detemir (LEVEMIR) 100 UNIT/ML injection, Inject 10-18 Units into the skin See admin instructions. 18 units in the morning, 10 units at bedtime, Disp: , Rfl:    insulin lispro (HUMALOG) 100 UNIT/ML KwikPen, Inject 2-3 Units into the skin See admin instructions. Per sliding scale at Breakfast and Dinner, Disp: , Rfl:    levothyroxine (SYNTHROID, LEVOTHROID) 50 MCG tablet, Take 50 mcg by mouth daily before breakfast. , Disp: , Rfl:    losartan (COZAAR) 25 MG tablet, TAKE 1 TABLET(25 MG) BY MOUTH DAILY, Disp: 90 tablet, Rfl: 3   metFORMIN (GLUCOPHAGE) 500 MG tablet, Take 2 tablets (1,000 mg total) by mouth 2 (two) times daily with a meal., Disp: , Rfl:    metoprolol succinate (TOPROL-XL) 100 MG 24 hr tablet, Take 0.5 tablets (50 mg total) by mouth daily. Take with or immediately following a meal., Disp: , Rfl:    Multiple Vitamin (MULTIVITAMIN) tablet, Take 1 tablet by mouth daily., Disp: , Rfl:    ONE TOUCH ULTRA TEST test strip, 1 each by Other route as needed for other. , Disp: , Rfl:    potassium chloride SA (KLOR-CON M) 20 MEQ tablet, Take 1 tablet (20 mEq total) by mouth daily., Disp: 30 tablet, Rfl: 3   simvastatin (ZOCOR) 20 MG tablet, Take 20 mg by mouth every evening. , Disp: , Rfl:    spironolactone (ALDACTONE) 25 MG tablet, Take 1 tablet (25 mg total) by mouth daily., Disp: 30 tablet, Rfl: 3   torsemide (DEMADEX) 20 MG tablet, Take 20 mg by mouth as needed., Disp: , Rfl:    zolpidem (AMBIEN) 5 MG tablet, Take 5 mg by mouth at bedtime as needed for sleep., Disp: , Rfl:  Allergies  Allergen Reactions   Atorvastatin Other (See Comments)    Muscle pain Other reaction(s): cramps Other reaction(s): cramps   Entresto [Sacubitril-Valsartan] Other (See Comments)    hypotension   Adhesive [Tape] Rash     Social History   Socioeconomic History   Marital  status: Married    Spouse name: Not on file   Number of children: Not on file   Years of education: Not on file   Highest education level: Not on file  Occupational History   Occupation: security guard    Comment: [employer]  Tobacco Use   Smoking status: Former    Types: Cigarettes    Quit date: 03/06/1985    Years since quitting: 36.9   Smokeless tobacco: Never  Vaping Use   Vaping Use: Never used  Substance and Sexual Activity   Alcohol use: No   Drug use: No   Sexual  activity: Not Currently  Other Topics Concern   Not on file  Social History Narrative   Not on file   Social Determinants of Health   Financial Resource Strain: Low Risk  (09/06/2021)   Overall Financial Resource Strain (CARDIA)    Difficulty of Paying Living Expenses: Not very hard  Recent Concern: Financial Resource Strain - High Risk (08/31/2021)   Overall Financial Resource Strain (CARDIA)    Difficulty of Paying Living Expenses: Hard  Food Insecurity: No Food Insecurity (08/31/2021)   Hunger Vital Sign    Worried About Running Out of Food in the Last Year: Never true    Ran Out of Food in the Last Year: Never true  Transportation Needs: No Transportation Needs (08/31/2021)   PRAPARE - Hydrologist (Medical): No    Lack of Transportation (Non-Medical): No  Physical Activity: Not on file  Stress: Not on file  Social Connections: Not on file  Intimate Partner Violence: Not on file    Physical Exam      Future Appointments  Date Time Provider [place]  01/31/2022  7:05 AM [facility] DEVICE REMOTES [facility]  02/09/2022  7:10 AM [facility] DEVICE REMOTES [facility]  03/08/2022 10:20 AM Bensimhon, Shaune Pascal, MD [facility] None  05/11/2022  7:10 AM [facility] DEVICE REMOTES [facility]  05/26/2022 11:15 AM Gery Pray, MD [facility] None  08/10/2022  7:10 AM [facility] DEVICE REMOTES [facility]      ACTION: Home visit completed

## 2022-01-26 ENCOUNTER — Telehealth (HOSPITAL_COMMUNITY): Payer: Self-pay

## 2022-01-26 NOTE — Telephone Encounter (Signed)
Spoke to [name] to check in from our home visit on Monday and he reports some swelling in both lower legs and he continues to endorse some wheezing and slight shortness of breath. He did state he has some chest pain around 4:00 today while walking into [employer]. He denied any dizziness with same. [name] said he took one torsemide yesterday and today's weight is down 3 lbs today from yesterday. His torsemide dose is as needed. I advised him to take another one today to see if this helps and we will reach out to HF clinic. Will route this to HF RN to seek advice to see if we should make any medication adjustments or follow up in clinic. [name] is aware and understands to reach out if any immediate needs come up or call EMS if he has severe trouble breathing.    Got him to send a remote transmission to device clinic for review also. Will route Bill Cheek RN in call also.   Call complete.   [name], [name] 01/26/2022

## 2022-01-27 ENCOUNTER — Telehealth (HOSPITAL_COMMUNITY): Payer: Self-pay

## 2022-01-27 NOTE — Telephone Encounter (Signed)
Spoke to [employer] and informed him of device notes per Esau Grew RN in HF clinic and informed him he could take another Torsemide tonight if needed to help with fluid and PRN following same. He says he feels like the swelling has improved today and his weight is down to 139lbs. He says he is still wheezing- I advised him that if he had no improvements of wheezing by Monday for [place] to reach back out to clinic or PCP for follow up. He agreed and understood. I reminded him that device clinic would be following up on Monday with remote transmission. I also will be following up. He agreed. Call complete.   [name], [place] 01/27/2022

## 2022-01-27 NOTE — Telephone Encounter (Signed)
Corvue impedance elevated indicated fluid accumulation, however, is starting to return to normal, Lasix working, continue to monitor, if wt decreasing and sx improving go back to lasix prn.  Pt is sch for device/impedance check on 6/19 with Sharman Cheek to ensure further improvement.

## 2022-01-31 ENCOUNTER — Telehealth (HOSPITAL_COMMUNITY): Payer: Self-pay

## 2022-01-31 ENCOUNTER — Telehealth: Payer: Self-pay

## 2022-01-31 ENCOUNTER — Ambulatory Visit (INDEPENDENT_AMBULATORY_CARE_PROVIDER_SITE_OTHER): Payer: Medicare Other

## 2022-01-31 DIAGNOSIS — I5022 Chronic systolic (congestive) heart failure: Secondary | ICD-10-CM

## 2022-01-31 DIAGNOSIS — Z95810 Presence of automatic (implantable) cardiac defibrillator: Secondary | ICD-10-CM

## 2022-01-31 NOTE — Telephone Encounter (Signed)
Spoke to Sofia this morning and he reports he is feeling better after the doses of Torsemide last week. He says his weight is 141 today and he reports improvement of wheezing and shortness of breath. He denied any swelling or edema or chest pain. He says he is going to work and feels fine. I advised him if anything changes to let me know and we will reach out to clinic. He agreed with plan. I will follow up in the home next week for an in person paramedicine visit. He agreed. Call complete.   [name], [place] 01/31/2022

## 2022-01-31 NOTE — Progress Notes (Signed)
EPIC Encounter for ICM Monitoring  Patient Name: [name] is a 71 y.o. male Date: 01/31/2022 Primary Care Physican: [name], MD Primary Cardiologist: Turner/Bensimohn Electrophysiologist: All[place] 10/20/2021 Weight: 140 lbs 11/02/2021 Weight: 144 - 145 lbs 12/28/2021 Weight: 152 lbs 01/31/2022 Weight: 139 lbs (baseline is 143-145 lbs)                                                            Spoke with patient and heart failure questions reviewed.  Pt is feeling fine now after taking Torsemide x 3 days last week due to fluid symptoms (notified HF clinic).  He does not typically drink a lot of fluids.  Last Torsemide taken 6/17   CorVue thoracic impedance suggesting possible fluid accumulation from 6/5-6/13 (addressed by HF clinic)    Prescribed: Torsemide 20 mg take 2 tablets (40 mg total) daily.   Potassium 20 mEq take 1 tablet by mouth daily.  Spironolactone 25 mg take 1 tablet by mouth daily. Farxiga 10 mg take 1 tablet by mouth daily.   Labs: 12/07/2021 Creatinine 1.20, BUN 17, Potassium 4.8, Sodium 137, GFR >60 10/21/2021 Creatinine 1.18, BUN 21, Potassium 4.0, Sodium 137, GFR >60 10/05/2021 Creatinine 1.52, BUN 30, Potassium 4.1, Sodium 136, GFR 48 10/04/2021 Creatinine 1.59, BUN 34, Potassium 4.1, Sodium 138, GFR 46  10/03/2021 Creatinine 1.35, BUN 25, Potassium 3.7, Sodium 141, GFR 56  10/02/2021 Creatinine 1.41, BUN 28, Potassium 3.0, Sodium 140, GFR 53  10/01/2021 Creatinine 1.65, BUN 30, Potassium 4.1, Sodium 138, GFR 44  09/30/2021 Creatinine 1.66, BUN 29, Potassium 3.8, Sodium 138, GFR 44  A complete set of results can be found in Results Review.   Recommendations:  Advised to drink 64 oz fluid for the next few days to rehydrate and not to take any PRN Torsemide over the next couple of days.  Pt did not drink a lot of fluids when he was taking Torsemide last week.    Follow-up plan: ICM clinic phone appointment on 02/07/2022 to recheck fluid levels.  91 day device  clinic remote transmission 02/09/2022.     EP/Cardiology Office Visits:  03/08/2022 with [facility].   Recall 05/14/2021 with Dr Rayann Heman.     Copy of ICM check sent to Dr. Rayann Heman.  Routed to [employer] HF clinic RN as Juluis Rainier.     3 month ICM trend: 01/31/2022.    12-14 Month ICM trend:     [name], RN 01/31/2022 2:46 PM

## 2022-01-31 NOTE — Telephone Encounter (Signed)
[facility] alert received for AF burden <1% of time. No OAC of hx on file.

## 2022-02-01 NOTE — Progress Notes (Unsigned)
Electrophysiology Office Note Date: 02/01/2022  ID:  [name], DOB 10-07-1950, MRN [id number]  PCP: [name], MD Primary Cardiologist: Fransico Him, MD Electrophysiologist: Thompson Grayer, MD   CC: Routine ICD follow-up  [name] is a 71 y.o. male seen today for Thompson Grayer, MD for routine electrophysiology followup.  Since last being seen in our clinic the patient reports doing very well.  he denies chest pain, palpitations, dyspnea, PND, orthopnea, nausea, vomiting, dizziness, syncope, edema, weight gain, or early satiety. He has not had ICD shocks.   Device History: St. Jude Dual Chamber ICD implanted 02/11/2020 for ICM and LBBB. CS lead unable to be placed due to poor anatomy and diaphragmatic stim along only available vein. History of appropriate therapy: No History of AAD therapy: No     Past Medical History:  Diagnosis Date   AICD (automatic cardioverter/defibrillator) present 02/11/2020   Anemia    Arthritis    Atrial tachycardia (HCC)    Chronic systolic CHF (congestive heart failure) ([place]) 07/04/2018   CKD (chronic kidney disease), stage III ([place])    Coronary artery disease 02/2012   a. s/p stenting in 2013  b. s/p CABGx2V (SVG--> PDA, SVG--> LCx).   Diabetes mellitus    neuropathy  insulin dependent   Dilated aortic root (HCC)    Dyslipidemia    Erectile dysfunction    GERD (gastroesophageal reflux disease)    History of CVA (cerebrovascular accident)    History of kidney stones    History of radiation therapy 12/03/2018-12/13/2018   left lung  Dr Gery Pray   Hx of radiation therapy to mediastinum 1985   Hypertension    Malignant seminoma of mediastinum ([place]) 1985   OSA (obstructive sleep apnea) 07/04/2018   Severe obstructive sleep apnea with an AHI of 30/h and mild central sleep apnea at 13.7/h with oxygen desaturations as low as 79%.  Intolerant to PAP therapy   S/P TAVR (transcatheter aortic valve replacement) 05/15/2018   29 mm Edwards  Sapien 3 transcatheter heart valve placed via percutaneous right transfemoral approach    Severe aortic stenosis    Past Surgical History:  Procedure Laterality Date   BIV ICD INSERTION CRT-D N/A 02/11/2020   Procedure: BIV ICD INSERTION CRT-D;  Surgeon: Thompson Grayer, MD;  Location: [facility];  Service: Cardiovascular;  Laterality: N/A;   COLONOSCOPY  07/25/2012   Procedure: COLONOSCOPY;  Surgeon: Winfield Cunas., MD;  Location: [facility] ENDOSCOPY;  Service: Endoscopy;  Laterality: N/A;   COLONOSCOPY N/A 12/02/2013   Procedure: COLONOSCOPY;  Surgeon: Winfield Cunas., MD;  Location: [facility] ENDOSCOPY;  Service: Endoscopy;  Laterality: N/A;   CORONARY ARTERY BYPASS GRAFT N/A 01/04/2013   Procedure: CORONARY ARTERY BYPASS GRAFTING (CABG) times two using right saphenous vein harvested with endoscope.;  Surgeon: Ivin Poot, MD;  Location: [facility];  Service: Open Heart Surgery;  Laterality: N/A;   ESOPHAGOGASTRODUODENOSCOPY  07/20/2012   Procedure: ESOPHAGOGASTRODUODENOSCOPY (EGD);  Surgeon: Winfield Cunas., MD;  Location[facility] ENDOSCOPY;  Service: Endoscopy;  Laterality: N/A;   ESOPHAGOGASTRODUODENOSCOPY N/A 12/02/2013   Procedure: ESOPHAGOGASTRODUODENOSCOPY (EGD);  Surgeon: Winfield Cunas., MD;  Location: [facility] ENDOS[facility];  Service: Endoscopy;  Laterality: N/A;   HOT HEMOSTASIS N/A 12/02/2013   Procedure: HOT HEMOSTASIS (ARGON PLASMA COAGULATION/BICAP);  Surgeon: Winfield Cunas., MD;  Location: [facility] ENDOSCOPY;  Service: Endoscopy;  Laterality: N/A;   INTRAOPERATIVE TRANSESOPHAGEAL ECHOCARDIOGRAM N/A 01/04/2013   Procedure: INTRAOPERATIVE TRANSESOPHAGEAL ECHOCARDIOGRAM;  Surgeon:  Ivin Poot, MD;  Location: [place];  Service: Open Heart Surgery;  Laterality: N/A;   INTRAOPERATIVE TRANSTHORACIC ECHOCARDIOGRAM N/A 05/15/2018   Procedure: INTRAOPERATIVE TRANSTHORACIC ECHOCARDIOGRAM;  Surgeon: Burnell Blanks, MD;  Location: [place];  Service: Open Heart Surgery;  Laterality: N/A;   IR  THORACENTESIS ASP PLEURAL SPACE W/IMG GUIDE  10/19/2018   LEFT HEART CATHETERIZATION WITH CORONARY ANGIOGRAM N/A 03/06/2012   Procedure: LEFT HEART CATHETERIZATION WITH CORONARY ANGIOGRAM;  Surgeon: [name], MD;  Location: [facility] CATH LAB;  Service: Cardiovascular;  Laterality: N/A;   LITHOTRIPSY     [place] Left 01/04/2013   Procedure: RADIAL ARTERY HARVEST;  Surgeon: Ivin Poot, MD;  Location: [facility];  Service: Vascular;  Laterality: Left;  Artery not havested. Unsuitable for use.   resection mediastinal seminonma     RIGHT/LEFT HEART CATH AND CORONARY/GRAFT ANGIOGRAPHY N/A 11/15/2016   Procedure: Right/Left Heart Cath and Coronary/Graft Angiography;  Surgeon: Leonie Man, MD;  Location: [facility];  Service: Cardiovascular;  Laterality: N/A;   RIGHT/LEFT HEART CATH AND CORONARY/GRAFT ANGIOGRAPHY N/A 04/13/2018   Procedure: RIGHT/LEFT HEART CATH AND CORONARY/GRAFT ANGIOGRAPHY;  Surgeon: Jolaine Artist, MD;  Location: [facility];  Service: Cardiovascular;  Laterality: N/A;   TRANSCATHETER AORTIC VALVE REPLACEMENT, TRANSFEMORAL  05/15/2018   TRANSCATHETER AORTIC VALVE REPLACEMENT, TRANSFEMORAL N/A 05/15/2018   Procedure: TRANSCATHETER AORTIC VALVE REPLACEMENT, TRANSFEMORAL;  Surgeon: Burnell Blanks, MD;  Location: [place];  Service: Open Heart Surgery;  Laterality: N/A;    Current Outpatient Medications  Medication Sig Dispense Refill   amitriptyline (ELAVIL) 25 MG tablet Take 25 mg by mouth at bedtime.      aspirin EC 81 MG EC tablet Take 1 tablet (81 mg total) by mouth daily. Swallow whole. 30 tablet 11   B Complex-C (SUPER B COMPLEX PO) Take 1 tablet by mouth daily.     B-D ULTRAFINE III SHORT PEN 31G X 8 MM MISC 1 each by Other route daily as needed (GLUCOSE TESTING (INSULINE NEEDLE)).      dapagliflozin propanediol (FARXIGA) 10 MG TABS tablet Take 1 tablet (10 mg total) by mouth daily. 90 tablet 3   digoxin (LANOXIN)  0.125 MG tablet Take 1 tablet (0.125 mg total) by mouth daily. 30 tablet 3   insulin detemir (LEVEMIR) 100 UNIT/ML injection Inject 10-18 Units into the skin See admin instructions. 18 units in the morning, 10 units at bedtime     insulin lispro (HUMALOG) 100 UNIT/ML KwikPen Inject 2-3 Units into the skin See admin instructions. Per sliding scale at Breakfast and Dinner     levothyroxine (SYNTHROID, LEVOTHROID) 50 MCG tablet Take 50 mcg by mouth daily before breakfast.      losartan (COZAAR) 25 MG tablet TAKE 1 TABLET(25 MG) BY MOUTH DAILY 90 tablet 3   metFORMIN (GLUCOPHAGE) 500 MG tablet Take 2 tablets (1,000 mg total) by mouth 2 (two) times daily with a meal.     metoprolol succinate (TOPROL-XL) 100 MG 24 hr tablet Take 0.5 tablets (50 mg total) by mouth daily. Take with or immediately following a meal.     Multiple Vitamin (MULTIVITAMIN) tablet Take 1 tablet by mouth daily.     ONE TOUCH ULTRA TEST test strip 1 each by Other route as needed for other.      potassium chloride SA (KLOR-CON M) 20 MEQ tablet Take 1 tablet (20 mEq total) by mouth daily. 30 tablet 3  simvastatin (ZOCOR) 20 MG tablet Take 20 mg by mouth every evening.      spironolactone (ALDACTONE) 25 MG tablet Take 1 tablet (25 mg total) by mouth daily. 30 tablet 3   torsemide (DEMADEX) 20 MG tablet Take 20 mg by mouth as needed.     zolpidem (AMBIEN) 5 MG tablet Take 5 mg by mouth at bedtime as needed for sleep.     No current facility-administered medications for this visit.    Allergies:   Atorvastatin, Entresto [sacubitril-valsartan], and Adhesive [tape]   Social History: Social History   Socioeconomic History   Marital status: Married    Spouse name: Not on file   Number of children: Not on file   Years of education: Not on file   Highest education level: Not on file  Occupational History   Occupation: security guard    Comment: [employer]  Tobacco Use   Smoking status: Former    Types: Cigarettes    Quit date:  03/06/1985    Years since quitting: 36.9   Smokeless tobacco: Never  Vaping Use   Vaping Use: Never used  Substance and Sexual Activity   Alcohol use: No   Drug use: No   Sexual activity: Not Currently  Other Topics Concern   Not on file  Social History Narrative   Not on file   Social Determinants of Health   Financial Resource Strain: Low Risk  (09/06/2021)   Overall Financial Resource Strain (CARDIA)    Difficulty of Paying Living Expenses: Not very hard  Recent Concern: Financial Resource Strain - High Risk (08/31/2021)   Overall Financial Resource Strain (CARDIA)    Difficulty of Paying Living Expenses: Hard  Food Insecurity: No Food Insecurity (08/31/2021)   Hunger Vital Sign    Worried About Running Out of Food in the Last Year: Never true    Ran Out of Food in the Last Year: Never true  Transportation Needs: No Transportation Needs (08/31/2021)   PRAPARE - Hydrologist (Medical): No    Lack of Transportation (Non-Medical): No  Physical Activity: Not on file  Stress: Not on file  Social Connections: Not on file  Intimate Partner Violence: Not on file    Family History: Family History  Problem Relation Age of Onset   Diabetes Father    Diabetes Mother    Heart failure Mother    Hypertension Mother    Cancer Brother    Diabetes Sister     Review of Systems: All other systems reviewed and are otherwise negative except as noted above.   Physical Exam: There were no vitals filed for this visit.   GEN- The patient is well appearing, alert and oriented x 3 today.   HEENT: normocephalic, atraumatic; sclera clear, conjunctiva pink; hearing intact; oropharynx clear; neck supple, no JVP Lymph- no cervical lymphadenopathy Lungs- Clear to ausculation bilaterally, normal work of breathing.  No wheezes, rales, rhonchi Heart- Regular rate and rhythm, no murmurs, rubs or gallops, PMI not laterally displaced GI- soft, non-tender, non-distended,  bowel sounds present, no hepatosplenomegaly Extremities- no clubbing or cyanosis. No edema; DP/PT/radial pulses 2+ bilaterally MS- no significant deformity or atrophy Skin- warm and dry, no rash or lesion; ICD pocket well healed Psych- euthymic mood, full affect Neuro- strength and sensation are intact  ICD interrogation- reviewed in detail today,  See PACEART report  EKG:  EKG is not ordered today. Personal review of EKG ordered 10/21/2021 shows NSR at 91 bpm, IVCD  at 150 ms  Recent Labs: 09/30/2021: ALT 27 10/01/2021: TSH 8.239 10/03/2021: Hemoglobin 13.0; Platelets 278 12/07/2021: B Natriuretic Peptide 965.4; BUN 17; Creatinine, Ser 1.20; Potassium 4.8; Sodium 137   Wt Readings from Last 3 Encounters:  01/24/22 147 lb (66.7 kg)  01/06/22 145 lb (65.8 kg)  12/16/21 154 lb (69.9 kg)     Other studies Reviewed: Additional studies/ records that were reviewed today include: Previous EP office notes.   Assessment and Plan:  1.  Chronic systolic dysfunction s/p St. Jude dual chamber ICD  euvolemic today Stable on an appropriate medical regimen Normal ICD function See P[facility] Art report No changes today Pt feels like his symptoms are severe enough to pursue BAT.   2. HTN Stable on current regimen  Intolerant to [facility] with hypotension   3. S/p TAVR Stable by Echo 08/2021   4. DM2 Per PCP  Current medicines are reviewed at length with the patient today.    Disposition:   Follow up with Dr. Curt Bears in 12 months to establish from Dr. Rayann Heman   Signed, Shirley Friar, PA-C  02/01/2022 10:38 AM  [place] [address] [place] [address] [phone] (office) [phone] (fax)

## 2022-02-02 ENCOUNTER — Ambulatory Visit (INDEPENDENT_AMBULATORY_CARE_PROVIDER_SITE_OTHER): Payer: Medicare Other | Admitting: Student

## 2022-02-02 ENCOUNTER — Encounter: Payer: Self-pay | Admitting: Student

## 2022-02-02 VITALS — BP 110/60 | HR 95 | Ht 70.0 in | Wt 148.0 lb

## 2022-02-02 DIAGNOSIS — I5022 Chronic systolic (congestive) heart failure: Secondary | ICD-10-CM

## 2022-02-02 DIAGNOSIS — I255 Ischemic cardiomyopathy: Secondary | ICD-10-CM

## 2022-02-02 DIAGNOSIS — Z952 Presence of prosthetic heart valve: Secondary | ICD-10-CM

## 2022-02-02 DIAGNOSIS — I10 Essential (primary) hypertension: Secondary | ICD-10-CM | POA: Diagnosis not present

## 2022-02-02 DIAGNOSIS — E119 Type 2 diabetes mellitus without complications: Secondary | ICD-10-CM | POA: Diagnosis not present

## 2022-02-02 DIAGNOSIS — Z794 Long term (current) use of insulin: Secondary | ICD-10-CM

## 2022-02-02 LAB — CUP PACEART INCLINIC DEVICE CHECK
Battery Remaining Longevity: 85 mo
Brady Statistic RA Percent Paced: 0 %
Brady Statistic RV Percent Paced: 0 %
Date Time Interrogation Session: 20230621094815
HighPow Impedance: 54 Ohm
Implantable Lead Implant Date: 20210629
Implantable Lead Implant Date: 20210629
Implantable Lead Location: 753859
Implantable Lead Location: 753860
Implantable Pulse Generator Implant Date: 20210629
Lead Channel Impedance Value: 400 Ohm
Lead Channel Impedance Value: 437.5 Ohm
Lead Channel Pacing Threshold Amplitude: 0.75 V
Lead Channel Pacing Threshold Amplitude: 0.75 V
Lead Channel Pacing Threshold Amplitude: 0.75 V
Lead Channel Pacing Threshold Amplitude: 0.75 V
Lead Channel Pacing Threshold Pulse Width: 0.5 ms
Lead Channel Pacing Threshold Pulse Width: 0.5 ms
Lead Channel Pacing Threshold Pulse Width: 0.5 ms
Lead Channel Pacing Threshold Pulse Width: 0.5 ms
Lead Channel Sensing Intrinsic Amplitude: 1.3 mV
Lead Channel Sensing Intrinsic Amplitude: 11.9 mV
Lead Channel Setting Pacing Amplitude: 2 V
Lead Channel Setting Pacing Amplitude: 2.5 V
Lead Channel Setting Pacing Pulse Width: 0.5 ms
Lead Channel Setting Sensing Sensitivity: 0.5 mV
Pulse Gen Serial Number: 9932570

## 2022-02-02 NOTE — Patient Instructions (Signed)
Medication Instructions:  Your physician recommends that you continue on your current medications as directed. Please refer to the Current Medication list given to you today.  *If you need a refill on your cardiac medications before your next appointment, please call your pharmacy*   Lab Work: None If you have labs (blood work) drawn today and your tests are completely normal, you will receive your results only by: [place] (if you have MyChart) OR A paper copy in the mail If you have any lab test that is abnormal or we need to change your treatment, we will call you to review the results.   Follow-Up: At [facility], you and your health needs are our priority.  As part of our continuing mission to provide you with exceptional heart care, we have created designated Provider Care Teams.  These Care Teams include your primary Cardiologist (physician) and Advanced Practice Providers (APPs -  Physician Assistants and Nurse Practitioners) who all work together to provide you with the care you need, when you need it.   Your next appointment:   1 year(s)  The format for your next appointment:   In Person  Provider:   Allegra Lai, MD{

## 2022-02-07 ENCOUNTER — Ambulatory Visit (INDEPENDENT_AMBULATORY_CARE_PROVIDER_SITE_OTHER): Payer: Medicare Other

## 2022-02-07 DIAGNOSIS — Z95810 Presence of automatic (implantable) cardiac defibrillator: Secondary | ICD-10-CM

## 2022-02-07 DIAGNOSIS — I5022 Chronic systolic (congestive) heart failure: Secondary | ICD-10-CM

## 2022-02-09 ENCOUNTER — Ambulatory Visit (INDEPENDENT_AMBULATORY_CARE_PROVIDER_SITE_OTHER): Payer: Medicare Other

## 2022-02-09 DIAGNOSIS — I255 Ischemic cardiomyopathy: Secondary | ICD-10-CM

## 2022-02-10 ENCOUNTER — Telehealth (HOSPITAL_COMMUNITY): Payer: Self-pay

## 2022-02-10 NOTE — Telephone Encounter (Signed)
Called to check in on [name] and see how he is doing and he reports he is feeling fine- we reports his weight is 141lbs and no swelling. He denied shortness of breath but says he still notices some wheezing. I advised him to reach out to his PCP for further as his most recent ICD transmission reports fluid being at baseline. He agreed with this.   He reports he has all medications and is taking same. He denied any chest pain, shortness of breath, dizziness or trouble with daily activities. He agreed to home paramedicine visit in two weeks on July 10th. He knows to reach out to clinic during my vacation. Call complete.   [name], [place] 02/10/2022

## 2022-02-11 LAB — CUP PACEART REMOTE DEVICE CHECK
Battery Remaining Longevity: 82 mo
Battery Remaining Percentage: 78 %
Battery Voltage: 2.98 V
Brady Statistic AP VP Percent: 0 %
Brady Statistic AP VS Percent: 0 %
Brady Statistic AS VP Percent: 0 %
Brady Statistic AS VS Percent: 99 %
Brady Statistic RA Percent Paced: 0 %
Brady Statistic RV Percent Paced: 0 %
Date Time Interrogation Session: 20230629182829
HighPow Impedance: 52 Ohm
HighPow Impedance: 52 Ohm
Implantable Lead Implant Date: 20210629
Implantable Lead Implant Date: 20210629
Implantable Lead Location: 753859
Implantable Lead Location: 753860
Implantable Pulse Generator Implant Date: 20210629
Lead Channel Impedance Value: 390 Ohm
Lead Channel Impedance Value: 430 Ohm
Lead Channel Pacing Threshold Amplitude: 0.75 V
Lead Channel Pacing Threshold Amplitude: 0.75 V
Lead Channel Pacing Threshold Pulse Width: 0.5 ms
Lead Channel Pacing Threshold Pulse Width: 0.5 ms
Lead Channel Sensing Intrinsic Amplitude: 1.2 mV
Lead Channel Sensing Intrinsic Amplitude: 11.9 mV
Lead Channel Setting Pacing Amplitude: 2 V
Lead Channel Setting Pacing Amplitude: 2.5 V
Lead Channel Setting Pacing Pulse Width: 0.5 ms
Lead Channel Setting Sensing Sensitivity: 0.5 mV
Pulse Gen Serial Number: 9932570

## 2022-02-11 NOTE — Progress Notes (Signed)
ICM Remote Transmission rescheduled from 7/24 to 7/31.

## 2022-02-21 ENCOUNTER — Other Ambulatory Visit (HOSPITAL_COMMUNITY): Payer: Self-pay

## 2022-02-21 NOTE — Progress Notes (Signed)
Paramedicine Encounter    Patient ID: [name], male    DOB: [birth year], 71 y.o.   MRN: [id number]   Arrived for home visit for [name] who reports feeling okay today. He reports over the last few weeks he has notices some increased shortness of breath with wheezing on exertion and sometimes during rest as well as intermittent lower leg swelling which is all resolved with Torsemide PRN. He reports the last dose of Torsemide he took was Thursday 7/6 in the evening. His weight was 148lbs at that time and two-three days later was down to 143lbs.  He states his weight yesterday was 143lbs and today he is 144lbs. He has no lower leg swelling noted today, lungs are clear. No JVD present.  Vitals obtained as noted.   BP- 110/62 HR- 104  O2- 96 RR- 16  WT- 144lbs  CBG- 173  [name] reports he is drinking around 32 ounces of fluid daily- I encouraged him to try to hydrate better, he agreed. I advised him around 60oz daily is preferred. He agreed with same and stated he would try to improve. He reports he is staying away from salty foods, but sometimes eats fast food and frozen meals. I encouraged him to avoid these and eat fresh, non processed foods if possible. He agreed.   We reviewed upcoming appointments- he will see Dr. Haroldine Laws in two weeks. I asked him if he felt he needed to be seen sooner and he denied same reporting he will be fine to follow up in two weeks and knows to reach out to me if needed.   Meds were verified and confirmed. He is taking them appropriately. He takes them from the bottles, does not use a pill box.   He continues to work at [employer] full time hours and states he has some added stress as he will be needing to find housing in the coming months once his divorce is final with his wife who will be moving back into the home he currently lives in. He is seeking options actively but reports this is adding to his stress. I encouraged him and provided some supportive conversation  during our visit, he knows I have resources to assist with housing if he needs them- he declined for now but appreciates the help.   I plan to meet [name] at [facility] clinic in two weeks- [name] knows to reach out if needed. Home visit complete.   [name], [place] 02/21/2022          Patient Care Team: [name], MD as PCP - General (Family Medicine) [name], MD as PCP - Cardiology (Cardiology) Thompson Grayer, MD as PCP - Electrophysiology (Cardiology)  Patient Active Problem List   Diagnosis Date Noted   Hypothyroidism 09/30/2021   Acute renal failure superimposed on stage 3a chronic kidney disease ([place]) 09/30/2021   Elevated troponin 09/30/2021   Normal anion gap metabolic acidosis 56/43/3295   Acute on chronic systolic (congestive) heart failure ([place]) 09/30/2021   Acute on chronic systolic CHF (congestive heart failure) ([place]) 08/30/2021   Ischemic cardiomyopathy 02/11/2020   Left bundle branch block 01/08/2020   Squamous cell carcinoma of bronchus in left lower lobe ([place]) 11/22/2018   Nodule of lower lobe of left lung 10/27/2018   OSA (obstructive sleep apnea) 18/84/1660   Chronic systolic CHF (congestive heart failure) ([place]) 07/04/2018   S/P TAVR (transcatheter aortic valve replacement) 05/15/2018   History of CVA (cerebrovascular accident)    Cardiomyopathy ([place])  11/11/2016   Aortic stenosis, severe 06/27/2014   AAA (abdominal aortic aneurysm) (HCC)    Insomnia    Hypertension    SOB (shortness of breath)    Insulin dependent type 2 diabetes mellitus ([place]) 12/28/2012   Dyslipidemia 12/28/2012   CAD (coronary artery disease), native coronary artery 02/13/2012   Hx of radiation therapy to mediastinum 08/16/1983   Malignant seminoma of mediastinum ([place]) 08/16/1983    Current Outpatient Medications:    amitriptyline (ELAVIL) 25 MG tablet, Take 25 mg by mouth at bedtime. , Disp: , Rfl:    aspirin EC 81 MG EC tablet, Take 1 tablet (81 mg  total) by mouth daily. Swallow whole., Disp: 30 tablet, Rfl: 11   B Complex-C (SUPER B COMPLEX PO), Take 1 tablet by mouth daily., Disp: , Rfl:    B-D ULTRAFINE III SHORT PEN 31G X 8 MM MISC, 1 each by Other route daily as needed (GLUCOSE TESTING (INSULINE NEEDLE)). , Disp: , Rfl:    dapagliflozin propanediol (FARXIGA) 10 MG TABS tablet, Take 1 tablet (10 mg total) by mouth daily., Disp: 90 tablet, Rfl: 3   digoxin (LANOXIN) 0.125 MG tablet, Take 1 tablet (0.125 mg total) by mouth daily., Disp: 30 tablet, Rfl: 3   insulin detemir (LEVEMIR) 100 UNIT/ML injection, Inject 10-18 Units into the skin See admin instructions. 18 units in the morning, 10 units at bedtime, Disp: , Rfl:    insulin lispro (HUMALOG) 100 UNIT/ML KwikPen, Inject 2-3 Units into the skin See admin instructions. Per sliding scale at Breakfast and Dinner, Disp: , Rfl:    levothyroxine (SYNTHROID, LEVOTHROID) 50 MCG tablet, Take 50 mcg by mouth daily before breakfast. , Disp: , Rfl:    losartan (COZAAR) 25 MG tablet, TAKE 1 TABLET(25 MG) BY MOUTH DAILY, Disp: 90 tablet, Rfl: 3   metFORMIN (GLUCOPHAGE) 500 MG tablet, Take 2 tablets (1,000 mg total) by mouth 2 (two) times daily with a meal., Disp: , Rfl:    metoprolol succinate (TOPROL-XL) 100 MG 24 hr tablet, Take 0.5 tablets (50 mg total) by mouth daily. Take with or immediately following a meal., Disp: , Rfl:    Multiple Vitamin (MULTIVITAMIN) tablet, Take 1 tablet by mouth daily., Disp: , Rfl:    ONE TOUCH ULTRA TEST test strip, 1 each by Other route as needed for other. , Disp: , Rfl:    potassium chloride SA (KLOR-CON M) 20 MEQ tablet, Take 1 tablet (20 mEq total) by mouth daily., Disp: 30 tablet, Rfl: 3   simvastatin (ZOCOR) 20 MG tablet, Take 20 mg by mouth every evening. , Disp: , Rfl:    spironolactone (ALDACTONE) 25 MG tablet, Take 1 tablet (25 mg total) by mouth daily., Disp: 30 tablet, Rfl: 3   torsemide (DEMADEX) 20 MG tablet, Take 20 mg by mouth as needed., Disp: , Rfl:     zolpidem (AMBIEN) 5 MG tablet, Take 5 mg by mouth at bedtime as needed for sleep., Disp: , Rfl:  Allergies  Allergen Reactions   Atorvastatin Other (See Comments)    Muscle pain Other reaction(s): cramps Other reaction(s): cramps   Entresto [Sacubitril-Valsartan] Other (See Comments)    hypotension   Adhesive [Tape] Rash     Social History   Socioeconomic History   Marital status: Married    Spouse name: Not on file   Number of children: Not on file   Years of education: Not on file   Highest education level: Not on file  Occupational History   Occupation:  security guard    Comment: [employer]  Tobacco Use   Smoking status: Former    Types: Cigarettes    Quit date: 03/06/1985    Years since quitting: 36.9   Smokeless tobacco: Never  Vaping Use   Vaping Use: Never used  Substance and Sexual Activity   Alcohol use: No   Drug use: No   Sexual activity: Not Currently  Other Topics Concern   Not on file  Social History Narrative   Not on file   Social Determinants of Health   Financial Resource Strain: Low Risk  (09/06/2021)   Overall Financial Resource Strain (CARDIA)    Difficulty of Paying Living Expenses: Not very hard  Recent Concern: Financial Resource Strain - High Risk (08/31/2021)   Overall Financial Resource Strain (CARDIA)    Difficulty of Paying Living Expenses: Hard  Food Insecurity: No Food Insecurity (08/31/2021)   Hunger Vital Sign    Worried About Running Out of Food in the Last Year: Never true    Ran Out of Food in the Last Year: Never true  Transportation Needs: No Transportation Needs (08/31/2021)   PRAPARE - Hydrologist (Medical): No    Lack of Transportation (Non-Medical): No  Physical Activity: Not on file  Stress: Not on file  Social Connections: Not on file  Intimate Partner Violence: Not on file    Physical Exam      Future Appointments  Date Time Provider [place]  03/08/2022 10:20 AM [place],  Shaune Pascal, MD [facility] None  03/14/2022  7:20 AM [facility] DEVICE REMOTES [facility]  05/11/2022  7:10 AM [facility] DEVICE REMOTES [facility]  05/26/2022 11:15 AM Gery Pray, MD [facility] None  08/10/2022  7:10 AM [facility] DEVICE REMOTES [facility]     ACTION: Home visit completed

## 2022-03-01 NOTE — Progress Notes (Signed)
Remote ICD transmission.   

## 2022-03-03 ENCOUNTER — Telehealth (HOSPITAL_COMMUNITY): Payer: Self-pay

## 2022-03-03 NOTE — Telephone Encounter (Signed)
Spoke to [place] today who reports feeling good with on shortness of breath but still having some wheezing. He denied any weight gain but says he did take a Torsemide on Tuesday this week for some swelling he had.   I reminded him of his upcoming appointment in the clinic next week and to bring all meds and I plan to meet him there. He agreed with plan and confirmed same.   Call complete.   [name], [place] 03/03/2022

## 2022-03-08 ENCOUNTER — Ambulatory Visit (HOSPITAL_COMMUNITY)
Admission: RE | Admit: 2022-03-08 | Discharge: 2022-03-08 | Disposition: A | Discharge status: Home / Self Care | Payer: Medicare Other | Source: Ambulatory Visit | Attending: Internal Medicine | Admitting: Internal Medicine

## 2022-03-08 ENCOUNTER — Other Ambulatory Visit (HOSPITAL_COMMUNITY): Payer: Self-pay

## 2022-03-08 ENCOUNTER — Encounter (HOSPITAL_COMMUNITY): Payer: Self-pay | Admitting: Internal Medicine

## 2022-03-08 VITALS — BP 108/68 | HR 97 | Wt 150.4 lb

## 2022-03-08 DIAGNOSIS — Z951 Presence of aortocoronary bypass graft: Secondary | ICD-10-CM | POA: Insufficient documentation

## 2022-03-08 DIAGNOSIS — I130 Hypertensive heart and chronic kidney disease with heart failure and stage 1 through stage 4 chronic kidney disease, or unspecified chronic kidney disease: Secondary | ICD-10-CM | POA: Diagnosis not present

## 2022-03-08 DIAGNOSIS — N1831 Chronic kidney disease, stage 3a: Secondary | ICD-10-CM | POA: Insufficient documentation

## 2022-03-08 DIAGNOSIS — Z952 Presence of prosthetic heart valve: Secondary | ICD-10-CM | POA: Diagnosis not present

## 2022-03-08 DIAGNOSIS — E1122 Type 2 diabetes mellitus with diabetic chronic kidney disease: Secondary | ICD-10-CM | POA: Diagnosis not present

## 2022-03-08 DIAGNOSIS — I2510 Atherosclerotic heart disease of native coronary artery without angina pectoris: Secondary | ICD-10-CM | POA: Insufficient documentation

## 2022-03-08 DIAGNOSIS — G4733 Obstructive sleep apnea (adult) (pediatric): Secondary | ICD-10-CM | POA: Insufficient documentation

## 2022-03-08 DIAGNOSIS — Z7982 Long term (current) use of aspirin: Secondary | ICD-10-CM | POA: Diagnosis not present

## 2022-03-08 DIAGNOSIS — Z79890 Hormone replacement therapy (postmenopausal): Secondary | ICD-10-CM | POA: Insufficient documentation

## 2022-03-08 DIAGNOSIS — I350 Nonrheumatic aortic (valve) stenosis: Secondary | ICD-10-CM | POA: Diagnosis not present

## 2022-03-08 DIAGNOSIS — E039 Hypothyroidism, unspecified: Secondary | ICD-10-CM | POA: Insufficient documentation

## 2022-03-08 DIAGNOSIS — Z794 Long term (current) use of insulin: Secondary | ICD-10-CM | POA: Diagnosis not present

## 2022-03-08 DIAGNOSIS — Z95810 Presence of automatic (implantable) cardiac defibrillator: Secondary | ICD-10-CM

## 2022-03-08 DIAGNOSIS — I10 Essential (primary) hypertension: Secondary | ICD-10-CM | POA: Diagnosis not present

## 2022-03-08 DIAGNOSIS — I5022 Chronic systolic (congestive) heart failure: Secondary | ICD-10-CM | POA: Diagnosis not present

## 2022-03-08 DIAGNOSIS — I452 Bifascicular block: Secondary | ICD-10-CM | POA: Diagnosis not present

## 2022-03-08 DIAGNOSIS — E119 Type 2 diabetes mellitus without complications: Secondary | ICD-10-CM | POA: Diagnosis not present

## 2022-03-08 DIAGNOSIS — Z79899 Other long term (current) drug therapy: Secondary | ICD-10-CM | POA: Diagnosis not present

## 2022-03-08 DIAGNOSIS — I959 Hypotension, unspecified: Secondary | ICD-10-CM | POA: Insufficient documentation

## 2022-03-08 LAB — DIGOXIN LEVEL: Digoxin Level: 0.9 ng/mL (ref 0.8–2.0)

## 2022-03-08 LAB — BASIC METABOLIC PANEL
Anion gap: 10 (ref 5–15)
BUN: 22 mg/dL (ref 8–23)
CO2: 23 mmol/L (ref 22–32)
Calcium: 9.2 mg/dL (ref 8.9–10.3)
Chloride: 105 mmol/L (ref 98–111)
Creatinine, Ser: 1.12 mg/dL (ref 0.61–1.24)
GFR, Estimated: >60 mL/min (ref >60)
Glucose, Bld: 117 mg/dL — ABNORMAL HIGH (ref 70–99)
Potassium: 4.6 mmol/L (ref 3.5–5.1)
Sodium: 138 mmol/L (ref 135–145)

## 2022-03-08 LAB — BRAIN NATRIURETIC PEPTIDE: B Natriuretic Peptide: 1980.1 pg/mL — ABNORMAL HIGH (ref 0.0–100.0)

## 2022-03-08 MED ORDER — TORSEMIDE 20 MG PO TABS: 20.0000 mg | ORAL_TABLET | Freq: Every day | ORAL | 3 refills | Status: DC

## 2022-03-08 NOTE — Progress Notes (Signed)
Paramedicine Encounter    Patient ID: [name], male    DOB: Dec 13, 1950, 71 y.o.   MRN: [id number]   Met with [name] in clinic today who was seen by Dr. Haroldine Laws. [name] reported having on going shortness of breath with wheezing as well as having trouble getting comfortable to sleep at night.   VITALS: WT- 150lbs BP- 108/58 HR- 97   ICD device was reviewed and REDS Clip was obtained.   ICD reported some fluid on board and REDS noted same with reading at 40%(normal is 35%)  [name] reports having issues with increased urine incontinence and says he sometimes has to wear a depends. I advised him to be sure to prepare for same and to talk with his PCP about this. I advised him there is a risk for increased urine output with the Torsemide.  Dr. Haroldine Laws assessed [name] and increased his Torsemide to 19m daily rather than PRN. [name] agreed with this plan and understands to monitor his weight and symptoms closely and report them to me so I can assist and advise the clinic. He agreed.   JLorneis set to return to HF clinic on Aug 16th at 0930 for APP visit. I plan to see him in the home in one week for follow up.   Clinic visit complete.      ACTION: Next visit planned for one week

## 2022-03-08 NOTE — Progress Notes (Signed)
ReDS Vest / Clip - 03/08/22 1000       ReDS Vest / Clip   Station Marker C    Ruler Value 26    ReDS Value Range Moderate volume overload    ReDS Actual Value 40

## 2022-03-08 NOTE — Patient Instructions (Signed)
Change  Torsemide to 20mg  daily.  Labs done today, your results will be available in MyChart, we will contact you for abnormal readings.  Your physician recommends that you schedule a follow-up appointment in: as scheduled   If you have any questions or concerns before your next appointment please send [place] a message through [place] or call our office at [phone].    TO LEAVE A MESSAGE FOR THE NURSE SELECT OPTION 2, PLEASE LEAVE A MESSAGE INCLUDING: YOUR NAME DATE OF BIRTH CALL BACK NUMBER REASON FOR CALL**this is important as we prioritize the call backs  YOU WILL RECEIVE A CALL BACK THE SAME DAY AS LONG AS YOU CALL BEFORE 4:00 PM  At the [facility], you and your health needs are our priority. As part of our continuing mission to provide you with exceptional heart care, we have created designated Provider Care Teams. These Care Teams include your primary Cardiologist (physician) and Advanced Practice Providers (APPs- Physician Assistants and Nurse Practitioners) who all work together to provide you with the care you need, when you need it.   You may see any of the following providers on your designated Care Team at your next follow up: Dr Glori Bickers Dr Haynes Kerns, NP Lyda Jester, [place] [facility] [place], [place] Audry Riles, PharmD   Please be sure to bring in all your medications bottles to every appointment.

## 2022-03-08 NOTE — Progress Notes (Signed)
PCP: Dr Radford Pax HF Cardiologist: Dr Vaughan Browner   HPI: [name] is a 71 y.o. with a history of squamous cell lung cancer LLL 1985, CAD, CABG x2 2013 , aortic stenosis, TAVR 2019, LBBB, St Jude CRT-D, HTN, OSA, and chronic HFrEF 2019.    Had RHC/LHC with Dr Haroldine Laws for TAVR work up.    Admitted 08/30/21 with A/C HFrEF. Echo completed EF remains < 20%. Diuresed with IV lasix and transitioned to lasix 40 mg twice a day. Intolerant entresto due to hypotentsion.GDMT with losartan, toprol xl, and spiro. Discharged 09/03/21 with CPAP machine but doesn't know how to use it, weight 152 pounds.   Admitted 09/30/21 with volume overload. PICC placed with stable CO-OX. Diuresed with IV lasix and transitioned to torsemide 40 mg daily. BB cut back. D/C weight 140 pounds.   Follow up 3/23, volume mildly up, Farxiga restarted. CPX arranged showing moderate to severe HF limitation.  Today he returns for HF follow up, here with Nira Conn w/ paramedicine. Says over the past month he has been more SOB and wheezing. Gets winded after mild exertion. Taking torsemide 20 mg about 2x/week. No edema or CP.   ICD interrogation No VT/AF. Fluid up      Cardiac Testing  - CPX (3/23): Moderate to severe HF limitation w/ markedly elevated VE/VCO2 slope and blunt BP response to exercise  FVC 3.17 (89%)      FEV1 2.54 (94%)        FEV1/FVC 80 (104%)        MVV 105 (83%)        Peak RER: 1.09   Peak VO2: 15.0 (57% predicted peak VO2)  VE/VCO2 slope: 45   - Echo 1/23 EF < 20%   - Echo 2021 EF < 20%   - Echo 2020 EF 30%   - LHC/RHC 2019   Ost 1st Diag to 1st Diag lesion is 50% stenosed. Prox LAD to Mid LAD lesion is 40% stenosed. Ost RCA lesion is 100% stenosed. SVG and is normal in caliber and large. SVG and is large. Ost Cx to Prox Cx lesion is 100% stenosed. Ost LAD to Prox LAD lesion is 40% stenosed.  Ao =109/68 (86) LV = 117/29 RA = 19 RV = 54/17 PA = 62/21 (42) PCW = 40 Fick cardiac output/index  = 3.5/1.78 PVR = 0.6 WU FA sat = 96% PA sat = 58%, 61% PAPi = 2.2 RA/PCWP = 0.475 CPO = 0.67 Av gradient: peak 15.0 mmHG  Mean 14.23mmHG  AVA = 0.89 cm2  ROS: All systems negative except as listed in HPI, PMH and Problem List.  SH:  Social History   Socioeconomic History   Marital status: Married    Spouse name: Not on file   Number of children: Not on file   Years of education: Not on file   Highest education level: Not on file  Occupational History   Occupation: security guard    Comment: [employer]  Tobacco Use   Smoking status: Former    Types: Cigarettes    Quit date: 03/06/1985    Years since quitting: 37.0   Smokeless tobacco: Never  Vaping Use   Vaping Use: Never used  Substance and Sexual Activity   Alcohol use: No   Drug use: No   Sexual activity: Not Currently  Other Topics Concern   Not on file  Social History Narrative   Not on file   Social Determinants of Health   Financial Resource Strain: Low Risk  (  09/06/2021)   Overall Financial Resource Strain (CARDIA)    Difficulty of Paying Living Expenses: Not very hard  Recent Concern: Financial Resource Strain - High Risk (08/31/2021)   Overall Financial Resource Strain (CARDIA)    Difficulty of Paying Living Expenses: Hard  Food Insecurity: No Food Insecurity (08/31/2021)   Hunger Vital Sign    Worried About Running Out of Food in the Last Year: Never true    Ran Out of Food in the Last Year: Never true  Transportation Needs: No Transportation Needs (08/31/2021)   PRAPARE - Hydrologist (Medical): No    Lack of Transportation (Non-Medical): No  Physical Activity: Not on file  Stress: Not on file  Social Connections: Not on file  Intimate Partner Violence: Not on file   FH:  Family History  Problem Relation Age of Onset   Diabetes Father    Diabetes Mother    Heart failure Mother    Hypertension Mother    Cancer Brother    Diabetes Sister    Past Medical History:   Diagnosis Date   AICD (automatic cardioverter/defibrillator) present 02/11/2020   Anemia    Arthritis    Atrial tachycardia (HCC)    Chronic systolic CHF (congestive heart failure) ([place]) 07/04/2018   CKD (chronic kidney disease), stage III ([place])    Coronary artery disease 02/2012   a. s/p stenting in 2013  b. s/p CABGx2V (SVG--> PDA, SVG--> LCx).   Diabetes mellitus    neuropathy  insulin dependent   Dilated aortic root (HCC)    Dyslipidemia    Erectile dysfunction    GERD (gastroesophageal reflux disease)    History of CVA (cerebrovascular accident)    History of kidney stones    History of radiation therapy 12/03/2018-12/13/2018   left lung  Dr Gery Pray   Hx of radiation therapy to mediastinum 1985   Hypertension    Malignant seminoma of mediastinum ([place]) 1985   OSA (obstructive sleep apnea) 07/04/2018   Severe obstructive sleep apnea with an AHI of 30/h and mild central sleep apnea at 13.7/h with oxygen desaturations as low as 79%.  Intolerant to PAP therapy   S/P TAVR (transcatheter aortic valve replacement) 05/15/2018   29 mm Edwards Sapien 3 transcatheter heart valve placed via percutaneous right transfemoral approach    Severe aortic stenosis    Current Outpatient Medications  Medication Sig Dispense Refill   amitriptyline (ELAVIL) 25 MG tablet Take 25 mg by mouth at bedtime.      aspirin EC 81 MG EC tablet Take 1 tablet (81 mg total) by mouth daily. Swallow whole. 30 tablet 11   B Complex-C (SUPER B COMPLEX PO) Take 1 tablet by mouth daily.     B-D ULTRAFINE III SHORT PEN 31G X 8 MM MISC 1 each by Other route daily as needed (GLUCOSE TESTING (INSULINE NEEDLE)).      dapagliflozin propanediol (FARXIGA) 10 MG TABS tablet Take 1 tablet (10 mg total) by mouth daily. 90 tablet 3   digoxin (LANOXIN) 0.125 MG tablet Take 1 tablet (0.125 mg total) by mouth daily. 30 tablet 3   insulin detemir (LEVEMIR) 100 UNIT/ML injection Inject 10-18 Units into the skin See admin  instructions. 18 units in the morning, 10 units at bedtime     insulin lispro (HUMALOG) 100 UNIT/ML KwikPen Inject 2-3 Units into the skin See admin instructions. Per sliding scale at Breakfast and Dinner     levothyroxine (SYNTHROID, LEVOTHROID) 50 MCG tablet  Take 50 mcg by mouth daily before breakfast.      losartan (COZAAR) 25 MG tablet TAKE 1 TABLET(25 MG) BY MOUTH DAILY 90 tablet 3   metFORMIN (GLUCOPHAGE) 500 MG tablet Take 2 tablets (1,000 mg total) by mouth 2 (two) times daily with a meal.     metoprolol succinate (TOPROL-XL) 100 MG 24 hr tablet Take 0.5 tablets (50 mg total) by mouth daily. Take with or immediately following a meal.     Multiple Vitamin (MULTIVITAMIN) tablet Take 1 tablet by mouth daily.     ONE TOUCH ULTRA TEST test strip 1 each by Other route as needed for other.      potassium chloride SA (KLOR-CON M) 20 MEQ tablet Take 1 tablet (20 mEq total) by mouth daily. 30 tablet 3   simvastatin (ZOCOR) 20 MG tablet Take 20 mg by mouth every evening.      spironolactone (ALDACTONE) 25 MG tablet Take 1 tablet (25 mg total) by mouth daily. 30 tablet 3   torsemide (DEMADEX) 20 MG tablet Take 20 mg by mouth as needed.     zolpidem (AMBIEN) 5 MG tablet Take 5 mg by mouth at bedtime as needed for sleep.     No current facility-administered medications for this encounter.   BP 108/68   Pulse 97   Wt 68.2 kg (150 lb 6.4 oz)   SpO2 97%   BMI 21.58 kg/m   Wt Readings from Last 3 Encounters:  03/08/22 68.2 kg (150 lb 6.4 oz)  02/21/22 65.3 kg (144 lb)  02/02/22 67.1 kg (148 lb)   PHYSICAL EXAM: General:  Well appearing. No resp difficulty HEENT: normal Neck: supple. no JVD. Carotids 2+ bilat; no bruits. No lymphadenopathy or thryomegaly appreciated. Cor: PMI nondisplaced. Regular rate & rhythm. No rubs, gallops or murmurs. Lungs: clear Abdomen: soft, nontender, nondistended. No hepatosplenomegaly. No bruits or masses. Good bowel sounds. Extremities: no cyanosis, clubbing,  rash, edema Neuro: alert & orientedx3, cranial nerves grossly intact. moves all 4 extremities w/o difficulty. Affect pleasant   ASSESSMENT & PLAN: Chronic Systolic Heart Failure: - s/p St Jude CRT-D 01/2020. Unable to place LV lead. Interestingly, he has a RBBB this admission and had LBBB on prior ECGs. Discussed with Dr. Lovena Le, with current RBBB a left bundle lead will not be likely to help.  - Had PYP 2020 not suggestive of amyloid.  - Echo 1/23: EF 20-25%, RV significantly reduced, RVSP 50 mmHg, moderate MR, moderate TR, mean gradient 2 mmHg across aortic valve prosthesis. - CPX (3/23): moderate to severe HF limitation. - NYHA III today in setting of volume overload.. Volume status elevated today on exam and [place] Increase torsemide to 20 daily  - REDS 40% - Continue Farxiga 10 mg daily.  - Continue Toprol 50 mg daily.  - Continue digoxin 0.125 mg daily. - Continue losartan 12.5 mg daily (failed Entresto in past due to low BP).  - Continue spironolactone 25 mg daily. - Corlanor previously stopped d/t cost.  - Labs today - Not a candidate for transplant (age). Would be high risk for VAD (CABG x 2). He is overall doing well, despite CPX results.  Long discussion about his situation and results of his CPX testing. We discussed advanced therapies as well as potential BaroStim treatment. However, he feels as if he is doing well and would not like to change for now   2. CAD - h/o CABG 2013 - No s/s angina - On asa 81 mg daily. - Continue simvastatin  20 mg daily.   3. DMII - Insulin dependent - Hgb A1C 8.3 in 01/23 - On SGLT2i. - Managed by PCP   4. OSA - continue CPAP  5. Aortic valve stenosis - S/P TAVR 2019 with 29 mm S3 - Valve okay on recent echo   7. Hypothyroid - On levothyroxine.    8. CKD Stage IIIa - SCr baseline 1.54 -1.6  - Labs today   Glori Bickers MD 10:51 AM

## 2022-03-12 ENCOUNTER — Other Ambulatory Visit (HOSPITAL_COMMUNITY): Payer: Self-pay | Admitting: Adult Health

## 2022-03-14 ENCOUNTER — Telehealth: Payer: Self-pay

## 2022-03-14 ENCOUNTER — Ambulatory Visit (INDEPENDENT_AMBULATORY_CARE_PROVIDER_SITE_OTHER): Payer: Medicare Other

## 2022-03-14 DIAGNOSIS — Z95810 Presence of automatic (implantable) cardiac defibrillator: Secondary | ICD-10-CM | POA: Diagnosis not present

## 2022-03-14 DIAGNOSIS — I5022 Chronic systolic (congestive) heart failure: Secondary | ICD-10-CM

## 2022-03-14 NOTE — Telephone Encounter (Signed)
Unsuccessful telephone encounter to patient to assess for s/s ongoing AT/AF. Remote history with low burden. No OAC. Per Dr. Rayann Heman if burden increases consider [place]. Will refer to AF clinic upon call back. [employer] compliant VM message left requesting call back to [phone].

## 2022-03-14 NOTE — Telephone Encounter (Signed)
Patient returning call. Unaware of AT/AF. Currently able to work daily.  No OAC. Continues to take Toprol XL 50 mg daily. Advised patient of referral to AF clinic. Patient agreeable. Routing note to AF clinic for scheduling.

## 2022-03-16 ENCOUNTER — Telehealth: Payer: Self-pay

## 2022-03-16 ENCOUNTER — Other Ambulatory Visit (HOSPITAL_COMMUNITY): Payer: Self-pay

## 2022-03-16 NOTE — Progress Notes (Signed)
Paramedicine Encounter    Patient ID: [name], male    DOB: 1951-02-18, 71 y.o.   MRN: [id number]   Arrived for home visit for [name] who reports feeling good today denying any shortness of breath or dizziness but did report having a few "twinges" of chest pain over the last week. He states since increased the Torsemide to daily he has slept better, improved breathing, increased urine output and his weight has gone down. Today his weight is 137lbs last weight was 150lbs. Vitals obtained:  BP- 98/62 HR- 46-98 (afib)  O2- 99% RR- 16  CBG- 210   [name] admits he has missed doses of his insulin over the last few weeks but has been consistent with his PO meds.  Lungs noted to have some rhonchi on the left lower and upper lobes. He says his breathing feels normal. He did not appear to be in distress upon sitting or walking.   [name] heart rate noted to be irregular so I placed him on the monitor and obtained EKG. EKG noted to be AFIB rates from 48-100. He denied chest pain during visit, denied shortness of breath or dizziness. He stated he felt "fine". I reached out to device clinic to have them review transmission from device and they report it also showed afib but since he is asymptomatic at present to continue with normal regimen and for him to follow up in AFIB clinic next week. [name] agreed.   I reviewed and verified meds and doses with him. No refills needed at present.   I will attend AFIB clinic visit with [name] next week at his request.   Home visit complete. I will see [name] in one week, he knows to reach out to clinic or EMS if symptoms arise and he feels the need to be seen.   [name], [place] 03/16/2022    Patient Care Team: [name], MD as PCP - General (Family Medicine) [name], MD as PCP - Cardiology (Cardiology) Thompson Grayer, MD as PCP - Electrophysiology (Cardiology)  Patient Active Problem List   Diagnosis Date Noted    Hypothyroidism 09/30/2021   Acute renal failure superimposed on stage 3a chronic kidney disease ([place]) 09/30/2021   Elevated troponin 09/30/2021   Normal anion gap metabolic acidosis 40/05/2724   Acute on chronic systolic (congestive) heart failure ([place]) 09/30/2021   Acute on chronic systolic CHF (congestive heart failure) ([place]) 08/30/2021   Ischemic cardiomyopathy 02/11/2020   Left bundle branch block 01/08/2020   Squamous cell carcinoma of bronchus in left lower lobe ([place]) 11/22/2018   Nodule of lower lobe of left lung 10/27/2018   OSA (obstructive sleep apnea) 36/64/4034   Chronic systolic CHF (congestive heart failure) ([place]) 07/04/2018   S/P TAVR (transcatheter aortic valve replacement) 05/15/2018   History of CVA (cerebrovascular accident)    Cardiomyopathy ([place]) 11/11/2016   Aortic stenosis, severe 06/27/2014   AAA (abdominal aortic aneurysm) (HCC)    Insomnia    Hypertension    SOB (shortness of breath)    Insulin dependent type 2 diabetes mellitus ([place]) 12/28/2012   Dyslipidemia 12/28/2012   CAD (coronary artery disease), native coronary artery 02/13/2012   Hx of radiation therapy to mediastinum 08/16/1983   Malignant seminoma of mediastinum ([name]) 08/16/1983    Current Outpatient Medications:    amitriptyline (ELAVIL) 25 MG tablet, Take 25 mg by mouth at bedtime. , Disp: , Rfl:    aspirin EC 81 MG EC tablet, Take 1 tablet (81 mg total)  by mouth daily. Swallow whole., Disp: 30 tablet, Rfl: 11   B Complex-C (SUPER B COMPLEX PO), Take 1 tablet by mouth daily., Disp: , Rfl:    B-D ULTRAFINE III SHORT PEN 31G X 8 MM MISC, 1 each by Other route daily as needed (GLUCOSE TESTING (INSULINE NEEDLE)). , Disp: , Rfl:    dapagliflozin propanediol (FARXIGA) 10 MG TABS tablet, Take 1 tablet (10 mg total) by mouth daily., Disp: 90 tablet, Rfl: 3   digoxin (LANOXIN) 0.125 MG tablet, Take 1 tablet (0.125 mg total) by mouth daily., Disp: 30 tablet, Rfl: 3   insulin detemir (LEVEMIR) 100 UNIT/ML  injection, Inject 10-18 Units into the skin See admin instructions. 18 units in the morning, 10 units at bedtime, Disp: , Rfl:    insulin lispro (HUMALOG) 100 UNIT/ML KwikPen, Inject 2-3 Units into the skin See admin instructions. Per sliding scale at Breakfast and Dinner, Disp: , Rfl:    levothyroxine (SYNTHROID, LEVOTHROID) 50 MCG tablet, Take 50 mcg by mouth daily before breakfast. , Disp: , Rfl:    losartan (COZAAR) 25 MG tablet, TAKE 1 TABLET(25 MG) BY MOUTH DAILY, Disp: 90 tablet, Rfl: 3   metFORMIN (GLUCOPHAGE) 500 MG tablet, Take 2 tablets (1,000 mg total) by mouth 2 (two) times daily with a meal., Disp: , Rfl:    metoprolol succinate (TOPROL-XL) 100 MG 24 hr tablet, Take 0.5 tablets (50 mg total) by mouth daily. Take with or immediately following a meal., Disp: , Rfl:    Multiple Vitamin (MULTIVITAMIN) tablet, Take 1 tablet by mouth daily., Disp: , Rfl:    ONE TOUCH ULTRA TEST test strip, 1 each by Other route as needed for other. , Disp: , Rfl:    potassium chloride SA (KLOR-CON M) 20 MEQ tablet, TAKE 1 TABLET BY MOUTH EVERY DAY, Disp: 30 tablet, Rfl: 3   simvastatin (ZOCOR) 20 MG tablet, Take 20 mg by mouth every evening. , Disp: , Rfl:    spironolactone (ALDACTONE) 25 MG tablet, Take 1 tablet (25 mg total) by mouth daily., Disp: 30 tablet, Rfl: 3   torsemide (DEMADEX) 20 MG tablet, Take 1 tablet (20 mg total) by mouth daily., Disp: 90 tablet, Rfl: 3   zolpidem (AMBIEN) 5 MG tablet, Take 5 mg by mouth at bedtime as needed for sleep., Disp: , Rfl:  Allergies  Allergen Reactions   Atorvastatin Other (See Comments)    Muscle pain Other reaction(s): cramps Other reaction(s): cramps   Entresto [Sacubitril-Valsartan] Other (See Comments)    hypotension   Adhesive [Tape] Rash     Social History   Socioeconomic History   Marital status: Married    Spouse name: Not on file   Number of children: Not on file   Years of education: Not on file   Highest education level: Not on file   Occupational History   Occupation: security guard    Comment: [employer]  Tobacco Use   Smoking status: Former    Types: Cigarettes    Quit date: 03/06/1985    Years since quitting: 37.0   Smokeless tobacco: Never  Vaping Use   Vaping Use: Never used  Substance and Sexual Activity   Alcohol use: No   Drug use: No   Sexual activity: Not Currently  Other Topics Concern   Not on file  Social History Narrative   Not on file   Social Determinants of Health   Financial Resource Strain: Low Risk  (09/06/2021)   Overall Financial Resource Strain (CARDIA)  Difficulty of Paying Living Expenses: Not very hard  Recent Concern: Financial Resource Strain - High Risk (08/31/2021)   Overall Financial Resource Strain (CARDIA)    Difficulty of Paying Living Expenses: Hard  Food Insecurity: No Food Insecurity (08/31/2021)   Hunger Vital Sign    Worried About Running Out of Food in the Last Year: Never true    Ran Out of Food in the Last Year: Never true  Transportation Needs: No Transportation Needs (08/31/2021)   PRAPARE - Hydrologist (Medical): No    Lack of Transportation (Non-Medical): No  Physical Activity: Not on file  Stress: Not on file  Social Connections: Not on file  Intimate Partner Violence: Not on file    Physical Exam      Future Appointments  Date Time Provider [place]  03/23/2022  8:30 AM Oliver Barre, [place] [facility] None  03/30/2022  9:30 AM [facility] PA/NP [facility] None  05/11/2022  7:10 AM [facility] DEVICE REMOTES [facility]  05/26/2022 11:15 AM Gery Pray, MD [facility] None  08/10/2022  7:10 AM [facility] DEVICE REMOTES [facility]     ACTION: Home visit completed

## 2022-03-16 NOTE — Telephone Encounter (Signed)
I got a voicemail from [place] for Dr. Mahalia Longest office. She states she visited the patient today at his home and he was having some A-fib. His rates ranged from 50-90 bpm. She got an EKG from him and wanted to compare it with his transmission. She would like for the nurse to give her a call back at [phone].

## 2022-03-16 NOTE — Telephone Encounter (Signed)
Called Heather from [address] EMT back informed her that a transmission had come across from this AM which showed AFL/ VS 60-95 which correlates with EKG she did. Heather appreciative of call back

## 2022-03-16 NOTE — Progress Notes (Signed)
EPIC Encounter for ICM Monitoring  Patient Name: [name] is a 71 y.o. male Date: 03/16/2022 Primary Care Physican: [name], MD Primary Cardiologist: [facility] Electrophysiologist: [place] 10/20/2021 Weight: 140 lbs 11/02/2021 Weight: 144 - 145 lbs 12/28/2021 Weight: 152 lbs 01/31/2022 Weight: 139 lbs (baseline is 143-145 lbs) 02/02/2022 Office Weight: 148 lbs  AT/AF Burden: 8.7% and referred to AFib clinic (no OAC)                                                            Transmission reviewed.    CorVue thoracic impedance suggesting normal fluid levels.   Prescribed: Torsemide 20 mg take 2 tablets (40 mg total) daily.   Potassium 20 mEq take 1 tablet by mouth daily.  Spironolactone 25 mg take 1 tablet by mouth daily. Farxiga 10 mg take 1 tablet by mouth daily.   Labs: 12/07/2021 Creatinine 1.20, BUN 17, Potassium 4.8, Sodium 137, GFR >60 10/21/2021 Creatinine 1.18, BUN 21, Potassium 4.0, Sodium 137, GFR >60 10/05/2021 Creatinine 1.52, BUN 30, Potassium 4.1, Sodium 136, GFR 48 10/04/2021 Creatinine 1.59, BUN 34, Potassium 4.1, Sodium 138, GFR 46  10/03/2021 Creatinine 1.35, BUN 25, Potassium 3.7, Sodium 141, GFR 56  10/02/2021 Creatinine 1.41, BUN 28, Potassium 3.0, Sodium 140, GFR 53  10/01/2021 Creatinine 1.65, BUN 30, Potassium 4.1, Sodium 138, GFR 44  09/30/2021 Creatinine 1.66, BUN 29, Potassium 3.8, Sodium 138, GFR 44  A complete set of results can be found in Results Review.   Recommendations:  No changes.  Pt contacted by device clinic nurse for AT/AF and has appointment with Afib clinic on 8/9.   Follow-up plan: ICM clinic phone appointment on 04/19/2022.  91 day device clinic remote transmission 9/278/2023.     EP/Cardiology Office Visits:  03/30/2022 with Advanced HF clinic.   Recall 01/28/2023 with Dr Lovena Le.     Copy of ICM check sent to Dr. Rayann Heman.    3 month ICM trend: 03/16/2022.    12-14 Month ICM trend:     [name],  RN 03/16/2022 2:11 PM

## 2022-03-19 ENCOUNTER — Other Ambulatory Visit (HOSPITAL_COMMUNITY): Payer: Self-pay | Admitting: Adult Health

## 2022-03-21 ENCOUNTER — Telehealth (HOSPITAL_COMMUNITY): Payer: Self-pay

## 2022-03-21 NOTE — Telephone Encounter (Signed)
Texted to remind [name] of his appointment in AFIB clinic at 0830.   [name], [place] 03/21/2022

## 2022-03-23 ENCOUNTER — Telehealth (HOSPITAL_COMMUNITY): Payer: Self-pay

## 2022-03-23 ENCOUNTER — Encounter (HOSPITAL_COMMUNITY): Payer: Self-pay | Admitting: Physician Assistant

## 2022-03-23 ENCOUNTER — Ambulatory Visit (HOSPITAL_COMMUNITY)
Admission: RE | Admit: 2022-03-23 | Discharge: 2022-03-23 | Disposition: A | Discharge status: Home / Self Care | Payer: Medicare Other | Source: Ambulatory Visit | Attending: Physician Assistant | Admitting: Physician Assistant

## 2022-03-23 VITALS — BP 104/72 | HR 90 | Ht 70.0 in | Wt 147.0 lb

## 2022-03-23 DIAGNOSIS — Z951 Presence of aortocoronary bypass graft: Secondary | ICD-10-CM | POA: Insufficient documentation

## 2022-03-23 DIAGNOSIS — I2510 Atherosclerotic heart disease of native coronary artery without angina pectoris: Secondary | ICD-10-CM | POA: Insufficient documentation

## 2022-03-23 DIAGNOSIS — G4733 Obstructive sleep apnea (adult) (pediatric): Secondary | ICD-10-CM | POA: Insufficient documentation

## 2022-03-23 DIAGNOSIS — I110 Hypertensive heart disease with heart failure: Secondary | ICD-10-CM | POA: Diagnosis not present

## 2022-03-23 DIAGNOSIS — Z7901 Long term (current) use of anticoagulants: Secondary | ICD-10-CM | POA: Diagnosis not present

## 2022-03-23 DIAGNOSIS — E119 Type 2 diabetes mellitus without complications: Secondary | ICD-10-CM | POA: Insufficient documentation

## 2022-03-23 DIAGNOSIS — Z95810 Presence of automatic (implantable) cardiac defibrillator: Secondary | ICD-10-CM | POA: Insufficient documentation

## 2022-03-23 DIAGNOSIS — Z79899 Other long term (current) drug therapy: Secondary | ICD-10-CM | POA: Insufficient documentation

## 2022-03-23 DIAGNOSIS — I5022 Chronic systolic (congestive) heart failure: Secondary | ICD-10-CM | POA: Insufficient documentation

## 2022-03-23 DIAGNOSIS — I4819 Other persistent atrial fibrillation: Secondary | ICD-10-CM | POA: Diagnosis not present

## 2022-03-23 DIAGNOSIS — D6869 Other thrombophilia: Secondary | ICD-10-CM | POA: Insufficient documentation

## 2022-03-23 MED ORDER — APIXABAN 5 MG PO TABS
5.0000 mg | ORAL_TABLET | Freq: Two times a day (BID) | ORAL | 3 refills | Status: DC
Start: 2022-03-23 — End: 2022-03-24

## 2022-03-23 NOTE — Progress Notes (Addendum)
Primary Care Physician: [name], MD Primary Cardiologist: Dr Radford Pax Primary Electrophysiologist: Dr Rayann Heman [facility] Department: Dr Haroldine Laws Referring Physician: Dr Nathen May clinic   [name] is a 71 y.o. male with a history of squamous cell lung cancer LLL 1985, CAD, CABG x2 2013 , aortic stenosis, TAVR 2019, LBBB, St Jude CRT-D, HTN, OSA, and chronic HFrEF, DM, atrial fibrillation who presents for follow up in the [facility].  The patient was initially diagnosed with atrial fibrillation remotely on his device with a very low burden. The device clinic received an alert 03/14/22 for an ongoing afib episode. Patient has a CHADS2VASC score of 5.   On follow up today, patient remains in afib today. He denies any new symptoms. He has chronic SOB which predates the onset of his afib. There were no specific triggers that he could identify.   Today, he denies symptoms of palpitations, chest pain, shortness of breath, orthopnea, PND, lower extremity edema, dizziness, presyncope, syncope, snoring, daytime somnolence, bleeding, or neurologic sequela. The patient is tolerating medications without difficulties and is otherwise without complaint today.    Atrial Fibrillation Risk Factors:  he does have symptoms or diagnosis of sleep apnea. he is not compliant with CPAP therapy. he does not have a history of rheumatic fever.   he has a BMI of Body mass index is 21.09 kg/m.Marland Kitchen Filed Weights   03/23/22 0825  Weight: 66.7 kg    Family History  Problem Relation Age of Onset   Diabetes Father    Diabetes Mother    Heart failure Mother    Hypertension Mother    Cancer Brother    Diabetes Sister      Atrial Fibrillation Management history:  Previous antiarrhythmic drugs: none Previous cardioversions: none Previous ablations: none CHADS2VASC score: 5 Anticoagulation history: none   Past Medical History:  Diagnosis Date   AICD (automatic  cardioverter/defibrillator) present 02/11/2020   Anemia    Arthritis    Atrial tachycardia (HCC)    Chronic systolic CHF (congestive heart failure) ([place]) 07/04/2018   CKD (chronic kidney disease), stage III ([place])    Coronary artery disease 02/2012   a. s/p stenting in 2013  b. s/p CABGx2V (SVG--> PDA, SVG--> LCx).   Diabetes mellitus    neuropathy  insulin dependent   Dilated aortic root (HCC)    Dyslipidemia    Erectile dysfunction    GERD (gastroesophageal reflux disease)    History of CVA (cerebrovascular accident)    History of kidney stones    History of radiation therapy 12/03/2018-12/13/2018   left lung  Dr Gery Pray   Hx of radiation therapy to mediastinum 1985   Hypertension    Malignant seminoma of mediastinum ([place]) 1985   OSA (obstructive sleep apnea) 07/04/2018   Severe obstructive sleep apnea with an AHI of 30/h and mild central sleep apnea at 13.7/h with oxygen desaturations as low as 79%.  Intolerant to PAP therapy   S/P TAVR (transcatheter aortic valve replacement) 05/15/2018   29 mm Edwards Sapien 3 transcatheter heart valve placed via percutaneous right transfemoral approach    Severe aortic stenosis    Past Surgical History:  Procedure Laterality Date   BIV ICD INSERTION CRT-D N/A 02/11/2020   Procedure: BIV ICD INSERTION CRT-D;  Surgeon: Thompson Grayer, MD;  Location: [place] CV LAB;  Service: Cardiovascular;  Laterality: N/A;   COLONOSCOPY  07/25/2012   Procedure: COLONOSCOPY;  Surgeon: Winfield Cunas., MD;  Location: [facility] ENDOSCOPY;  Service: Endoscopy;  Laterality: N/A;   COLONOSCOPY N/A 12/02/2013   Procedure: COLONOSCOPY;  Surgeon: Winfield Cunas., MD;  Location: [facility] ENDOSCOPY;  Service: Endoscopy;  Laterality: N/A;   CORONARY ARTERY BYPASS GRAFT N/A 01/04/2013   Procedure: CORONARY ARTERY BYPASS GRAFTING (CABG) times two using right saphenous vein harvested with endoscope.;  Surgeon: Ivin Poot, MD;  Location: [place];  Service: Open Heart  Surgery;  Laterality: N/A;   ESOPHAGOGASTRODUODENOSCOPY  07/20/2012   Procedure: ESOPHAGOGASTRODUODENOSCOPY (EGD);  Surgeon: Winfield Cunas., MD;  Location[facility] ENDOSCOPY;  Service: Endoscopy;  Laterality: N/A;   ESOPHAGOGASTRODUODENOSCOPY N/A 12/02/2013   Procedure: ESOPHAGOGASTRODUODENOSCOPY (EGD);  Surgeon: Winfield Cunas., MD;  Location: [facility] ENDOSCOPY;  Service: Endoscopy;  Laterality: N/A;   HOT HEMOSTASIS N/A 12/02/2013   Procedure: HOT HEMOSTASIS (ARGON PLASMA COAGULATION/BICAP);  Surgeon: Winfield Cunas., MD;  Location: [facility] ENDOSCOPY;  Service: Endoscopy;  Laterality: N/A;   INTRAOPERATIVE TRANSESOPHAGEAL ECHOCARDIOGRAM N/A 01/04/2013   Procedure: INTRAOPERATIVE TRANSESOPHAGEAL ECHOCARDIOGRAM;  Surgeon: Ivin Poot, MD;  Location: [place];  Service: Open Heart Surgery;  Laterality: N/A;   INTRAOPERATIVE TRANSTHORACIC ECHOCARDIOGRAM N/A 05/15/2018   Procedure: INTRAOPERATIVE TRANSTHORACIC ECHOCARDIOGRAM;  Surgeon: Burnell Blanks, MD;  Location: [place];  Service: Open Heart Surgery;  Laterality: N/A;   IR THORACENTESIS ASP PLEURAL SPACE W/IMG GUIDE  10/19/2018   LEFT HEART CATHETERIZATION WITH CORONARY ANGIOGRAM N/A 03/06/2012   Procedure: LEFT HEART CATHETERIZATION WITH CORONARY ANGIOGRAM;  Surgeon: [name], MD;  Location: [place] CATH LAB;  Service: Cardiovascular;  Laterality: N/A;   LITHOTRIPSY     [place] Left 01/04/2013   Procedure: RADIAL ARTERY HARVEST;  Surgeon: Ivin Poot, MD;  Location: [place];  Service: Vascular;  Laterality: Left;  Artery not havested. Unsuitable for use.   resection mediastinal seminonma     RIGHT/LEFT HEART CATH AND CORONARY/GRAFT ANGIOGRAPHY N/A 11/15/2016   Procedure: Right/Left Heart Cath and Coronary/Graft Angiography;  Surgeon: Leonie Man, MD;  Location: [place] CV LAB;  Service: Cardiovascular;  Laterality: N/A;   RIGHT/LEFT HEART CATH AND CORONARY/GRAFT ANGIOGRAPHY N/A 04/13/2018    Procedure: RIGHT/LEFT HEART CATH AND CORONARY/GRAFT ANGIOGRAPHY;  Surgeon: Jolaine Artist, MD;  Location: [place] CV LAB;  Service: Cardiovascular;  Laterality: N/A;   TRANSCATHETER AORTIC VALVE REPLACEMENT, TRANSFEMORAL  05/15/2018   TRANSCATHETER AORTIC VALVE REPLACEMENT, TRANSFEMORAL N/A 05/15/2018   Procedure: TRANSCATHETER AORTIC VALVE REPLACEMENT, TRANSFEMORAL;  Surgeon: Burnell Blanks, MD;  Location: [place];  Service: Open Heart Surgery;  Laterality: N/A;    Current Outpatient Medications  Medication Sig Dispense Refill   amitriptyline (ELAVIL) 25 MG tablet Take 25 mg by mouth at bedtime.      aspirin EC 81 MG EC tablet Take 1 tablet (81 mg total) by mouth daily. Swallow whole. 30 tablet 11   B Complex-C (SUPER B COMPLEX PO) Take 1 tablet by mouth daily.     B-D ULTRAFINE III SHORT PEN 31G X 8 MM MISC 1 each by Other route daily as needed (GLUCOSE TESTING (INSULINE NEEDLE)).      dapagliflozin propanediol (FARXIGA) 10 MG TABS tablet Take 1 tablet (10 mg total) by mouth daily. 90 tablet 3   digoxin (LANOXIN) 0.125 MG tablet Take 1 tablet (0.125 mg total) by mouth daily. 30 tablet 3   insulin detemir (LEVEMIR) 100 UNIT/ML injection Inject 10-18 Units into the skin See admin instructions. 18 units in the morning, 10 units at bedtime  insulin lispro (HUMALOG) 100 UNIT/ML KwikPen Inject 2-3 Units into the skin See admin instructions. Per sliding scale at Breakfast and Dinner     levothyroxine (SYNTHROID, LEVOTHROID) 50 MCG tablet Take 50 mcg by mouth daily before breakfast.      losartan (COZAAR) 25 MG tablet TAKE 1 TABLET(25 MG) BY MOUTH DAILY 90 tablet 3   metFORMIN (GLUCOPHAGE) 500 MG tablet Take 2 tablets (1,000 mg total) by mouth 2 (two) times daily with a meal.     metoprolol succinate (TOPROL-XL) 100 MG 24 hr tablet Take 0.5 tablets (50 mg total) by mouth daily. Take with or immediately following a meal.     Multiple Vitamin (MULTIVITAMIN) tablet Take 1 tablet by mouth  daily.     ONE TOUCH ULTRA TEST test strip 1 each by Other route as needed for other.      potassium chloride SA (KLOR-CON M) 20 MEQ tablet TAKE 1 TABLET BY MOUTH EVERY DAY 30 tablet 3   simvastatin (ZOCOR) 20 MG tablet Take 20 mg by mouth every evening.      spironolactone (ALDACTONE) 25 MG tablet TAKE 1 TABLET(25 MG) BY MOUTH DAILY 30 tablet 3   torsemide (DEMADEX) 20 MG tablet Take 1 tablet (20 mg total) by mouth daily. 90 tablet 3   zolpidem (AMBIEN) 5 MG tablet Take 5 mg by mouth at bedtime as needed for sleep.     No current facility-administered medications for this encounter.    Allergies  Allergen Reactions   Atorvastatin Other (See Comments)    Muscle pain Other reaction(s): cramps Other reaction(s): cramps   Entresto [Sacubitril-Valsartan] Other (See Comments)    hypotension   Adhesive [Tape] Rash    Social History   Socioeconomic History   Marital status: Married    Spouse name: Not on file   Number of children: Not on file   Years of education: Not on file   Highest education level: Not on file  Occupational History   Occupation: security guard    Comment: [employer]  Tobacco Use   Smoking status: Former    Types: Cigarettes    Quit date: 03/06/1985    Years since quitting: 37.0   Smokeless tobacco: Never   Tobacco comments:    Former smoker 03/23/22  Vaping Use   Vaping Use: Never used  Substance and Sexual Activity   Alcohol use: No   Drug use: No   Sexual activity: Not Currently  Other Topics Concern   Not on file  Social History Narrative   Not on file   Social Determinants of Health   Financial Resource Strain: Low Risk  (09/06/2021)   Overall Financial Resource Strain (CARDIA)    Difficulty of Paying Living Expenses: Not very hard  Recent Concern: Financial Resource Strain - High Risk (08/31/2021)   Overall Financial Resource Strain (CARDIA)    Difficulty of Paying Living Expenses: Hard  Food Insecurity: No Food Insecurity (08/31/2021)   Hunger  Vital Sign    Worried About Running Out of Food in the Last Year: Never true    Ran Out of Food in the Last Year: Never true  Transportation Needs: No Transportation Needs (08/31/2021)   PRAPARE - Hydrologist (Medical): No    Lack of Transportation (Non-Medical): No  Physical Activity: Not on file  Stress: Not on file  Social Connections: Not on file  Intimate Partner Violence: Not on file     ROS- All systems are reviewed and negative  except as per the HPI above.  Physical Exam: Vitals:   03/23/22 0825  Weight: 66.7 kg    GEN- The patient is a well appearing male, alert and oriented x 3 today.   Head- normocephalic, atraumatic Eyes-  Sclera clear, conjunctiva pink Ears- hearing intact Oropharynx- clear Neck- supple  Lungs- Clear to ausculation bilaterally, normal work of breathing Heart- irregular rate and rhythm, no murmurs, rubs or gallops  GI- soft, NT, ND, + BS Extremities- no clubbing, cyanosis, or edema MS- no significant deformity or atrophy Skin- no rash or lesion Psych- euthymic mood, full affect Neuro- strength and sensation are intact  Wt Readings from Last 3 Encounters:  03/23/22 66.7 kg  03/16/22 62.1 kg  03/08/22 68.2 kg    EKG today demonstrates  Coarse afib, LBBB Vent. rate 90 BPM PR interval * ms QRS duration 140 ms QT/QTcB 420/513 ms  Echo 08/30/21 demonstrated   1. Left ventricular ejection fraction, by estimation, is 20 to 25%. The  left ventricle has severely decreased function. The left ventricle  demonstrates global hypokinesis. There is mild left ventricular  hypertrophy. Left ventricular diastolic parameters   are indeterminate.   2. Right ventricular systolic function is mildly reduced. The right  ventricular size is moderately enlarged. There is moderately elevated pulmonary artery systolic pressure. The estimated right ventricular systolic pressure is 81.0 mmHg.   3. Right atrial size was mildly dilated.    4. Large pleural effusion in the left lateral region.   5. The mitral valve is normal in structure. Moderate mitral valve  regurgitation. No evidence of mitral stenosis.   6. Tricuspid valve regurgitation is moderate.   7. There is a 29 mm Edwards Sapien prosthetic (TAVR) valve present in the aortic position      Aortic valve regurgitation is not visualized. Aortic valve mean  gradient measures 2.0 mmHg.   8. Aortic dilatation noted. There is mild dilatation of the ascending  aorta, measuring 38 mm.   9. The inferior vena cava is dilated in size with <50% respiratory  variability, suggesting right atrial pressure of 15 mmHg.   Epic records are reviewed at length today  CHA2DS2-VASc Score = 5  The patient's score is based upon: CHF History: 1 HTN History: 1 Diabetes History: 1 Stroke History: 0 Vascular Disease History: 1 Age Score: 1 Gender Score: 0       ASSESSMENT AND PLAN: 1. Persistent Atrial Fibrillation The patient's CHA2DS2-VASc score is 5, indicating a 7.2% annual risk of stroke.   General education about afib provided and questions answered. We also discussed his stroke risk and the risks and benefits of anticoagulation. Start Eliquis 5 mg BID, stop ASA. We discussed DCCV after 3 weeks of anticoagulation. He needs to talk to his children to arrange a ride, he will call clinic back to schedule.  Continue Toprol 50 mg daily  2. Secondary Hypercoagulable State (ICD10:  D68.69) The patient is at significant risk for stroke/thromboembolism based upon his CHA2DS2-VASc Score of 5.  Continue Apixaban (Eliquis).   3. CAD S/p CABG 2013 No anginal symptoms.  4. Obstructive sleep apnea The importance of adequate treatment of sleep apnea was discussed today in order to improve our ability to maintain sinus rhythm long term. Patient is not on CPAP therapy.  5. Chronic HFrEF S/p dual chamber ICD Fluid status appears stable. CorVue 03/14/22 suggested normal fluid  levels.   Follow up in the AF clinic to arrange DCCV, patient to call back with availability.  [facility] [address] [place], [place] [address] [phone] 03/23/2022 8:26 AM

## 2022-03-23 NOTE — Telephone Encounter (Signed)
Received a call from [name] stating he went to pick up Eliquis from his pharmacy and they told him his insurance would not approve the medication and for him to talk with his doctor about same. I advised [name] I would send a message over to RN in [facility] about same to see if it requires a Prior Authorization to be sent and I can assist him with obtaining samples if needed from the clinic in the mean time.   I will continue to follow up.  [name], [name] Paramedic with [facility]  [phone] 03/23/2022

## 2022-03-23 NOTE — Patient Instructions (Signed)
Stop aspirin  Start Eliquis 5mg  twice a day  Call with dates for cardioversion (after 8/30) - Linlee Cromie [phone]

## 2022-03-24 MED ORDER — RIVAROXABAN 20 MG PO TABS: 20.0000 mg | ORAL_TABLET | Freq: Every day | ORAL | 3 refills | Status: DC

## 2022-03-24 NOTE — Telephone Encounter (Signed)
Insurance denied auth for eliquis - Xarelto is the preferred drug of choice with pt insurance. Pt notified and Xarelto 20mg  once a day was sent into pharmacy.

## 2022-03-24 NOTE — Addendum Note (Signed)
Addended by: Juluis Mire on: 03/24/2022 01:25 PM   Modules accepted: Orders

## 2022-03-24 NOTE — Telephone Encounter (Signed)
Preauth started on eliquis [id number] will await response.

## 2022-03-29 ENCOUNTER — Telehealth (HOSPITAL_COMMUNITY): Payer: Self-pay

## 2022-03-29 NOTE — Telephone Encounter (Signed)
Called to remind [name] of his appointment in the morning at 0930 in the HF clinic. He agreed and knows to bring all meds. I will meet him there. Call complete.   [name], [place] 03/29/2022

## 2022-03-30 ENCOUNTER — Encounter (HOSPITAL_COMMUNITY): Payer: Self-pay

## 2022-03-30 ENCOUNTER — Ambulatory Visit (HOSPITAL_COMMUNITY)
Admission: RE | Admit: 2022-03-30 | Discharge: 2022-03-30 | Disposition: A | Discharge status: Home / Self Care | Payer: Medicare Other | Source: Ambulatory Visit | Attending: Family Medicine | Admitting: Family Medicine

## 2022-03-30 ENCOUNTER — Other Ambulatory Visit (HOSPITAL_COMMUNITY): Payer: Self-pay

## 2022-03-30 VITALS — BP 100/62 | HR 85 | Wt 145.6 lb

## 2022-03-30 DIAGNOSIS — G4733 Obstructive sleep apnea (adult) (pediatric): Secondary | ICD-10-CM

## 2022-03-30 DIAGNOSIS — I2510 Atherosclerotic heart disease of native coronary artery without angina pectoris: Secondary | ICD-10-CM

## 2022-03-30 DIAGNOSIS — I350 Nonrheumatic aortic (valve) stenosis: Secondary | ICD-10-CM | POA: Diagnosis not present

## 2022-03-30 DIAGNOSIS — Z951 Presence of aortocoronary bypass graft: Secondary | ICD-10-CM | POA: Diagnosis not present

## 2022-03-30 DIAGNOSIS — Z9989 Dependence on other enabling machines and devices: Secondary | ICD-10-CM

## 2022-03-30 DIAGNOSIS — Z79890 Hormone replacement therapy (postmenopausal): Secondary | ICD-10-CM | POA: Insufficient documentation

## 2022-03-30 DIAGNOSIS — I4510 Unspecified right bundle-branch block: Secondary | ICD-10-CM | POA: Insufficient documentation

## 2022-03-30 DIAGNOSIS — I130 Hypertensive heart and chronic kidney disease with heart failure and stage 1 through stage 4 chronic kidney disease, or unspecified chronic kidney disease: Secondary | ICD-10-CM | POA: Diagnosis not present

## 2022-03-30 DIAGNOSIS — Z952 Presence of prosthetic heart valve: Secondary | ICD-10-CM

## 2022-03-30 DIAGNOSIS — E119 Type 2 diabetes mellitus without complications: Secondary | ICD-10-CM

## 2022-03-30 DIAGNOSIS — Z7901 Long term (current) use of anticoagulants: Secondary | ICD-10-CM | POA: Insufficient documentation

## 2022-03-30 DIAGNOSIS — E039 Hypothyroidism, unspecified: Secondary | ICD-10-CM | POA: Diagnosis not present

## 2022-03-30 DIAGNOSIS — Z7984 Long term (current) use of oral hypoglycemic drugs: Secondary | ICD-10-CM | POA: Diagnosis not present

## 2022-03-30 DIAGNOSIS — N1831 Chronic kidney disease, stage 3a: Secondary | ICD-10-CM

## 2022-03-30 DIAGNOSIS — I5022 Chronic systolic (congestive) heart failure: Secondary | ICD-10-CM | POA: Diagnosis not present

## 2022-03-30 DIAGNOSIS — I4819 Other persistent atrial fibrillation: Secondary | ICD-10-CM | POA: Diagnosis not present

## 2022-03-30 DIAGNOSIS — E1122 Type 2 diabetes mellitus with diabetic chronic kidney disease: Secondary | ICD-10-CM | POA: Insufficient documentation

## 2022-03-30 DIAGNOSIS — Z95810 Presence of automatic (implantable) cardiac defibrillator: Secondary | ICD-10-CM

## 2022-03-30 DIAGNOSIS — Z79899 Other long term (current) drug therapy: Secondary | ICD-10-CM | POA: Diagnosis not present

## 2022-03-30 DIAGNOSIS — Z794 Long term (current) use of insulin: Secondary | ICD-10-CM | POA: Diagnosis not present

## 2022-03-30 LAB — BASIC METABOLIC PANEL
Anion gap: 14 (ref 5–15)
BUN: 42 mg/dL — ABNORMAL HIGH (ref 8–23)
CO2: 26 mmol/L (ref 22–32)
Calcium: 8.9 mg/dL (ref 8.9–10.3)
Chloride: 98 mmol/L (ref 98–111)
Creatinine, Ser: 1.29 mg/dL — ABNORMAL HIGH (ref 0.61–1.24)
GFR, Estimated: 59 mL/min — ABNORMAL LOW (ref >60)
Glucose, Bld: 118 mg/dL — ABNORMAL HIGH (ref 70–99)
Potassium: 4.1 mmol/L (ref 3.5–5.1)
Sodium: 138 mmol/L (ref 135–145)

## 2022-03-30 LAB — CBC
HCT: 37.6 % — ABNORMAL LOW (ref 39.0–52.0)
Hemoglobin: 12.2 g/dL — ABNORMAL LOW (ref 13.0–17.0)
MCH: 23.6 pg — ABNORMAL LOW (ref 26.0–34.0)
MCHC: 32.4 g/dL (ref 30.0–36.0)
MCV: 72.7 fL — ABNORMAL LOW (ref 80.0–100.0)
Platelets: 411 10*3/uL — ABNORMAL HIGH (ref 150–400)
RBC: 5.17 MIL/uL (ref 4.22–5.81)
RDW: 16.2 % — ABNORMAL HIGH (ref 11.5–15.5)
WBC: 9.3 10*3/uL (ref 4.0–10.5)
nRBC: 0 % (ref 0.0–0.2)

## 2022-03-30 LAB — BRAIN NATRIURETIC PEPTIDE: B Natriuretic Peptide: 1880.7 pg/mL — ABNORMAL HIGH (ref 0.0–100.0)

## 2022-03-30 MED ORDER — POTASSIUM CHLORIDE CRYS ER 20 MEQ PO TBCR: 40.0000 meq | EXTENDED_RELEASE_TABLET | Freq: Every day | ORAL | 3 refills | Status: DC

## 2022-03-30 MED ORDER — TORSEMIDE 20 MG PO TABS: 40.0000 mg | ORAL_TABLET | Freq: Every day | ORAL | 3 refills | Status: AC

## 2022-03-30 NOTE — Progress Notes (Signed)
Paramedicine Encounter    Patient ID: [name], male    DOB: Feb 05, 1951, 71 y.o.   MRN: [id number]   Met with [name] in clinic today who reports feeling poorly with increased shortness of breath, fatigue and intermittent swelling.   Today vitals were obtained:  WT- 145 in clinic  WT- 140 at home  BP- 100/62 HR- 85 O2- 100%  REDS CLIP 38%  Today [name] saw [name] in clinic where she increased the following meds-  Torsemide 68m daily Potassium 40MEQ daily   JJanett Billowwill be having BAdministrator, Civil Servicetalk with [name] he is now interested this to help him feel better. JJanett Billowwill also ensure he has an AFIB clinic follow up to revisit cardioversion scheduling.    He had labs drawn today and will need them re-drawn in 10 days. I will plan to see [name] in one week in the home for follow upp, he agreed with this plan.   Clinic visit complete. [name] knows to reach out to me if needed in the mean time.   [name]   Patient Care Team: KLujean Amel MD as PCP - General (Family Medicine) TSueanne Margarita MD as PCP - Cardiology (Cardiology) AThompson Grayer MD as PCP - Electrophysiology (Cardiology)  Patient Active Problem List   Diagnosis Date Noted   Persistent atrial fibrillation ([place] 03/23/2022   Secondary hypercoagulable state ([place] 03/23/2022   Hypothyroidism 09/30/2021   Acute renal failure superimposed on stage 3a chronic kidney disease ([facility] 09/30/2021   Elevated troponin 09/30/2021   Normal anion gap metabolic acidosis 011/57/2620  Acute on chronic systolic (congestive) heart failure ([facility] 09/30/2021   Acute on chronic systolic CHF (congestive heart failure) ([place] 08/30/2021   Ischemic cardiomyopathy 02/11/2020   Left bundle branch block 01/08/2020   Squamous cell carcinoma of bronchus in left lower lobe ([place] 11/22/2018   Nodule of lower lobe of left lung 10/27/2018   OSA (obstructive sleep apnea) 07/04/2018   Chronic  systolic CHF (congestive heart failure) ([place] 07/04/2018   S/P TAVR (transcatheter aortic valve replacement) 05/15/2018   History of CVA (cerebrovascular accident)    Cardiomyopathy ([place] 11/11/2016   Aortic stenosis, severe 06/27/2014   AAA (abdominal aortic aneurysm) (HCC)    Insomnia    Hypertension    SOB (shortness of breath)    Insulin dependent type 2 diabetes mellitus ([place] 12/28/2012   Dyslipidemia 12/28/2012   CAD (coronary artery disease), native coronary artery 02/13/2012   Hx of radiation therapy to mediastinum 08/16/1983   Malignant seminoma of mediastinum (HEnon 08/16/1983    Current Outpatient Medications:    amitriptyline (ELAVIL) 25 MG tablet, Take 25 mg by mouth at bedtime. , Disp: , Rfl:    B Complex-C (SUPER B COMPLEX PO), Take 1 tablet by mouth daily., Disp: , Rfl:    B-D ULTRAFINE III SHORT PEN 31G X 8 MM MISC, 1 each by Other route daily as needed (GLUCOSE TESTING (INSULINE NEEDLE)). , Disp: , Rfl:    dapagliflozin propanediol (FARXIGA) 10 MG TABS tablet, Take 1 tablet (10 mg total) by mouth daily., Disp: 90 tablet, Rfl: 3   digoxin (LANOXIN) 0.125 MG tablet, Take 1 tablet (0.125 mg total) by mouth daily., Disp: 30 tablet, Rfl: 3   insulin detemir (LEVEMIR) 100 UNIT/ML injection, Inject 10-18 Units into the skin See admin instructions. 18 units in the morning, 10 units at bedtime, Disp: , Rfl:    insulin lispro (HUMALOG) 100 UNIT/ML KwikPen, Inject 2-3  Units into the skin See admin instructions. Per sliding scale at Breakfast and Dinner, Disp: , Rfl:    levothyroxine (SYNTHROID, LEVOTHROID) 50 MCG tablet, Take 50 mcg by mouth daily before breakfast. , Disp: , Rfl:    losartan (COZAAR) 25 MG tablet, TAKE 1 TABLET(25 MG) BY MOUTH DAILY, Disp: 90 tablet, Rfl: 3   metFORMIN (GLUCOPHAGE) 500 MG tablet, Take 2 tablets (1,000 mg total) by mouth 2 (two) times daily with a meal., Disp: , Rfl:    metoprolol succinate (TOPROL-XL) 100 MG 24 hr tablet, Take 0.5 tablets (50 mg  total) by mouth daily. Take with or immediately following a meal., Disp: , Rfl:    Multiple Vitamin (MULTIVITAMIN) tablet, Take 1 tablet by mouth daily., Disp: , Rfl:    ONE TOUCH ULTRA TEST test strip, 1 each by Other route as needed for other. , Disp: , Rfl:    potassium chloride SA (KLOR-CON M) 20 MEQ tablet, Take 2 tablets (40 mEq total) by mouth daily., Disp: 180 tablet, Rfl: 3   rivaroxaban (XARELTO) 20 MG TABS tablet, Take 1 tablet (20 mg total) by mouth daily with supper., Disp: 30 tablet, Rfl: 3   simvastatin (ZOCOR) 20 MG tablet, Take 20 mg by mouth every evening. , Disp: , Rfl:    spironolactone (ALDACTONE) 25 MG tablet, TAKE 1 TABLET(25 MG) BY MOUTH DAILY, Disp: 30 tablet, Rfl: 3   torsemide (DEMADEX) 20 MG tablet, Take 2 tablets (40 mg total) by mouth daily., Disp: 180 tablet, Rfl: 3   zolpidem (AMBIEN) 5 MG tablet, Take 5 mg by mouth at bedtime as needed for sleep., Disp: , Rfl:  Allergies  Allergen Reactions   Atorvastatin Other (See Comments)    Muscle pain Other reaction(s): cramps Other reaction(s): cramps   Entresto [Sacubitril-Valsartan] Other (See Comments)    hypotension   Adhesive [Tape] Rash     Social History   Socioeconomic History   Marital status: Married    Spouse name: Not on file   Number of children: Not on file   Years of education: Not on file   Highest education level: Not on file  Occupational History   Occupation: security guard    Comment: [employer]  Tobacco Use   Smoking status: Former    Types: Cigarettes    Quit date: 03/06/1985    Years since quitting: 37.0   Smokeless tobacco: Never   Tobacco comments:    Former smoker 03/23/22  Vaping Use   Vaping Use: Never used  Substance and Sexual Activity   Alcohol use: No   Drug use: No   Sexual activity: Not Currently  Other Topics Concern   Not on file  Social History Narrative   Not on file   Social Determinants of Health   Financial Resource Strain: Low Risk  (09/06/2021)   Overall  Financial Resource Strain (CARDIA)    Difficulty of Paying Living Expenses: Not very hard  Recent Concern: Financial Resource Strain - High Risk (08/31/2021)   Overall Financial Resource Strain (CARDIA)    Difficulty of Paying Living Expenses: Hard  Food Insecurity: No Food Insecurity (08/31/2021)   Hunger Vital Sign    Worried About Running Out of Food in the Last Year: Never true    Ran Out of Food in the Last Year: Never true  Transportation Needs: No Transportation Needs (08/31/2021)   PRAPARE - Hydrologist (Medical): No    Lack of Transportation (Non-Medical): No  Physical Activity: Not  on file  Stress: Not on file  Social Connections: Not on file  Intimate Partner Violence: Not on file    Physical Exam      Future Appointments  Date Time Provider [place]  04/12/2022  8:30 AM [facility] LAB [facility] None  04/19/2022  9:25 AM [facility] DEVICE REMOTES [facility]  05/11/2022  7:10 AM [facility] DEVICE REMOTES [facility]  05/13/2022  8:30 AM [facility] PA/NP [facility] None  05/26/2022 11:15 AM Gery Pray, MD [facility] None  07/04/2022  9:00 AM Bensimhon, Shaune Pascal, MD [facility] None  08/10/2022  7:10 AM [facility] DEVICE REMOTES [facility]     ACTION: Home visit completed

## 2022-03-30 NOTE — Progress Notes (Signed)
PCP: Dr Radford Pax HF Cardiologist: Dr Vaughan Browner   HPI: [name] is a 71 y.o. with a history of squamous cell lung cancer LLL 1985, CAD, CABG x2 2013 , aortic stenosis, TAVR 2019, LBBB, St Jude CRT-D, HTN, OSA, and chronic HFrEF 2019.    Had RHC/LHC with Dr Haroldine Laws for TAVR work up.    Admitted 08/30/21 with A/C HFrEF. Echo completed EF remains < 20%. Diuresed with IV lasix and transitioned to lasix 40 mg twice a day. Intolerant entresto due to hypotentsion.GDMT with losartan, toprol xl, and spiro. Discharged 09/03/21 with CPAP machine but doesn't know how to use it, weight 152 pounds.   Admitted 09/30/21 with volume overload. PICC placed with stable CO-OX. Diuresed with IV lasix and transitioned to torsemide 40 mg daily. BB cut back. D/C weight 140 pounds.   Follow up 3/23, volume mildly up, Farxiga restarted. CPX arranged showing moderate to severe HF limitation.  Follow up 7/23, volume up. Started on torsemide 20 mg daily. Seen in AF clinic after device showed AF alert on 03/14/22. Started on Xarelto [employer] preference) and arranged for DCCV after 3 weeks.  Today he returns for HF follow up, here with Nira Conn with paramedicine. Overall feeling poorly. No energy and SOB with minimal activity. Feels like he has fluid on his lungs. Denies abnormal  bleeding, CP, dizziness, edema, or PND/Orthopnea. Appetite ok. No fever or chills. Weight at home 145 pounds. Taking all medications.  Took extra 20 mg torsemide yesterday.   Cardiac Testing  - CPX (3/23): Moderate to severe HF limitation w/ markedly elevated VE/VCO2 slope and blunt BP response to exercise  FVC 3.17 (89%)      FEV1 2.54 (94%)        FEV1/FVC 80 (104%)        MVV 105 (83%)        Peak RER: 1.09   Peak VO2: 15.0 (57% predicted peak VO2)  VE/VCO2 slope: 45   - Echo 1/23 EF < 20%   - Echo 2021 EF < 20%   - Echo 2020 EF 30%   - LHC/RHC 2019   Ost 1st Diag to 1st Diag lesion is 50% stenosed. Prox LAD to Mid LAD lesion is  40% stenosed. Ost RCA lesion is 100% stenosed. SVG and is normal in caliber and large. SVG and is large. Ost Cx to Prox Cx lesion is 100% stenosed. Ost LAD to Prox LAD lesion is 40% stenosed.  Ao =109/68 (86) LV = 117/29 RA = 19 RV = 54/17 PA = 62/21 (42) PCW = 40 Fick cardiac output/index = 3.5/1.78 PVR = 0.6 WU FA sat = 96% PA sat = 58%, 61% PAPi = 2.2 RA/PCWP = 0.475 CPO = 0.67 Av gradient: peak 15.0 mmHG  Mean 14.52mmHG  AVA = 0.89 cm2  ROS: All systems negative except as listed in HPI, PMH and Problem List.  SH:  Social History   Socioeconomic History   Marital status: Married    Spouse name: Not on file   Number of children: Not on file   Years of education: Not on file   Highest education level: Not on file  Occupational History   Occupation: security guard    Comment: [employer]  Tobacco Use   Smoking status: Former    Types: Cigarettes    Quit date: 03/06/1985    Years since quitting: 37.0   Smokeless tobacco: Never   Tobacco comments:    Former smoker 03/23/22  Vaping Use   Vaping Use:  Never used  Substance and Sexual Activity   Alcohol use: No   Drug use: No   Sexual activity: Not Currently  Other Topics Concern   Not on file  Social History Narrative   Not on file   Social Determinants of Health   Financial Resource Strain: Low Risk  (09/06/2021)   Overall Financial Resource Strain (CARDIA)    Difficulty of Paying Living Expenses: Not very hard  Recent Concern: Financial Resource Strain - High Risk (08/31/2021)   Overall Financial Resource Strain (CARDIA)    Difficulty of Paying Living Expenses: Hard  Food Insecurity: No Food Insecurity (08/31/2021)   Hunger Vital Sign    Worried About Running Out of Food in the Last Year: Never true    Ran Out of Food in the Last Year: Never true  Transportation Needs: No Transportation Needs (08/31/2021)   PRAPARE - Hydrologist (Medical): No    Lack of Transportation (Non-Medical):  No  Physical Activity: Not on file  Stress: Not on file  Social Connections: Not on file  Intimate Partner Violence: Not on file   FH:  Family History  Problem Relation Age of Onset   Diabetes Father    Diabetes Mother    Heart failure Mother    Hypertension Mother    Cancer Brother    Diabetes Sister    Past Medical History:  Diagnosis Date   AICD (automatic cardioverter/defibrillator) present 02/11/2020   Anemia    Arthritis    Atrial tachycardia (HCC)    Chronic systolic CHF (congestive heart failure) ([place]) 07/04/2018   CKD (chronic kidney disease), stage III ([place])    Coronary artery disease 02/2012   a. s/p stenting in 2013  b. s/p CABGx2V (SVG--> PDA, SVG--> LCx).   Diabetes mellitus    neuropathy  insulin dependent   Dilated aortic root (HCC)    Dyslipidemia    Erectile dysfunction    GERD (gastroesophageal reflux disease)    History of CVA (cerebrovascular accident)    History of kidney stones    History of radiation therapy 12/03/2018-12/13/2018   left lung  Dr Gery Pray   Hx of radiation therapy to mediastinum 1985   Hypertension    Malignant seminoma of mediastinum ([place]) 1985   OSA (obstructive sleep apnea) 07/04/2018   Severe obstructive sleep apnea with an AHI of 30/h and mild central sleep apnea at 13.7/h with oxygen desaturations as low as 79%.  Intolerant to PAP therapy   S/P TAVR (transcatheter aortic valve replacement) 05/15/2018   29 mm Edwards Sapien 3 transcatheter heart valve placed via percutaneous right transfemoral approach    Severe aortic stenosis    Current Outpatient Medications  Medication Sig Dispense Refill   amitriptyline (ELAVIL) 25 MG tablet Take 25 mg by mouth at bedtime.      B Complex-C (SUPER B COMPLEX PO) Take 1 tablet by mouth daily.     B-D ULTRAFINE III SHORT PEN 31G X 8 MM MISC 1 each by Other route daily as needed (GLUCOSE TESTING (INSULINE NEEDLE)).      dapagliflozin propanediol (FARXIGA) 10 MG TABS tablet Take 1 tablet  (10 mg total) by mouth daily. 90 tablet 3   digoxin (LANOXIN) 0.125 MG tablet Take 1 tablet (0.125 mg total) by mouth daily. 30 tablet 3   insulin detemir (LEVEMIR) 100 UNIT/ML injection Inject 10-18 Units into the skin See admin instructions. 18 units in the morning, 10 units at bedtime  insulin lispro (HUMALOG) 100 UNIT/ML KwikPen Inject 2-3 Units into the skin See admin instructions. Per sliding scale at Breakfast and Dinner     levothyroxine (SYNTHROID, LEVOTHROID) 50 MCG tablet Take 50 mcg by mouth daily before breakfast.      losartan (COZAAR) 25 MG tablet TAKE 1 TABLET(25 MG) BY MOUTH DAILY 90 tablet 3   metFORMIN (GLUCOPHAGE) 500 MG tablet Take 2 tablets (1,000 mg total) by mouth 2 (two) times daily with a meal.     metoprolol succinate (TOPROL-XL) 100 MG 24 hr tablet Take 0.5 tablets (50 mg total) by mouth daily. Take with or immediately following a meal.     Multiple Vitamin (MULTIVITAMIN) tablet Take 1 tablet by mouth daily.     ONE TOUCH ULTRA TEST test strip 1 each by Other route as needed for other.      potassium chloride SA (KLOR-CON M) 20 MEQ tablet TAKE 1 TABLET BY MOUTH EVERY DAY 30 tablet 3   rivaroxaban (XARELTO) 20 MG TABS tablet Take 1 tablet (20 mg total) by mouth daily with supper. 30 tablet 3   simvastatin (ZOCOR) 20 MG tablet Take 20 mg by mouth every evening.      spironolactone (ALDACTONE) 25 MG tablet TAKE 1 TABLET(25 MG) BY MOUTH DAILY 30 tablet 3   torsemide (DEMADEX) 20 MG tablet Take 1 tablet (20 mg total) by mouth daily. 90 tablet 3   zolpidem (AMBIEN) 5 MG tablet Take 5 mg by mouth at bedtime as needed for sleep.     No current facility-administered medications for this encounter.   BP 100/62   Pulse 85   Wt 66 kg (145 lb 9.6 oz)   SpO2 100%   BMI 20.89 kg/m   Wt Readings from Last 3 Encounters:  03/30/22 66 kg (145 lb 9.6 oz)  03/23/22 66.7 kg (147 lb)  03/16/22 62.1 kg (137 lb)   PHYSICAL EXAM: General:  NAD. No resp difficulty,  fatigued-appearing. HEENT: Normal Neck: Supple. JVP to jaw. Carotids 2+ bilat; no bruits. No lymphadenopathy or thryomegaly appreciated. Cor: PMI nondisplaced. Irregular rate & rhythm. No rubs, gallops or murmurs. Lungs: Faint bibasilar crackles  Abdomen: Soft, nontender, nondistended. No hepatosplenomegaly. No bruits or masses. Good bowel sounds. Extremities: No cyanosis, clubbing, rash, edema Neuro: Alert & oriented x 3, cranial nerves grossly intact. Moves all 4 extremities w/o difficulty. Affect pleasant.  ReDs: 38%  ICD interrogation (personally reviewed): CoreVue stable.  ASSESSMENT & PLAN: Chronic Systolic Heart Failure: - s/p St Jude CRT-D 01/2020. Unable to place LV lead. Interestingly, he has a RBBB this admission and had LBBB on prior ECGs. Discussed with Dr. Lovena Le, with current RBBB a left bundle lead will not be likely to help.  - Had PYP 2020 not suggestive of amyloid.  - Echo 1/23: EF 20-25%, RV significantly reduced, RVSP 50 mmHg, moderate MR, moderate TR, mean gradient 2 mmHg across aortic valve prosthesis. - CPX (3/23): moderate to severe HF limitation. - NYHA IIIb today in setting of volume overload and AF. Volume status elevated on exam, REDs 38%.  Corevue looks OK. - Increase torsemide to 40 mg daily + 40 KCL daily. - Continue Farxiga 10 mg daily.  - Continue Toprol 50 mg daily.  - Continue digoxin 0.125 mg daily. - Continue losartan 25 mg daily (failed Entresto in past due to low BP).  - Continue spironolactone 25 mg daily. - Corlanor previously stopped d/t cost.  - BMET and BNP today; BMET and dig trough in 10 days. -  Not a candidate for transplant (age). Would be high risk for VAD (CABG x 2). Dr. Haroldine Laws previously had a long discussion about his situation and results of his CPX testing. Discussed advanced therapies as well as potential BaroStim treatment.  - Discussed BaroStim again today. He feels poorly. He is interested. Will reach out to [facility] RN and  place referral to [facility] Dr. Trula Slade.   2. CAD - h/o CABG 2013 - No s/s angina. - No ASA with A/C. - Continue simvastatin 20 mg daily.   3. Persistent AFib - In AFib by device 03/14/22. - CHA2DS2-VASc score is 5 - Continue Xarelto. No bleeding issues. - Planning DCCV. Will reach out to AFib clinic to see if this can be scheduled. Hopefully will feel some better in NSR. - He tolerates AFib poorly. Maintaining NSR will be important. - Continue CPAP  4. DMII - Insulin dependent. - Hgb A1C 8.3 in 01/23 - On SGLT2i. - Managed by PCP.   5. OSA - Continue CPAP.  6. Aortic valve stenosis - S/P TAVR 2019 with 29 mm S3 - Valve okay on recent echo.   7. Hypothyroid - On levothyroxine.    8. CKD Stage IIIa - SCr baseline 1.54 -1.6  - Labs today  Follow up in 4-6 weeks with APP and 3 months with Dr. Haroldine Laws.  Maricela Bo Keven Osborn FNP-BC 9:46 AM

## 2022-03-30 NOTE — Patient Instructions (Addendum)
Thank you for coming in today  Labs were done today, if any labs are abnormal the clinic will call you No news is good news  INCREASE Torsemide 40 mg daily  INCREASE Potassium 40 meq daily   Your physician recommends that you return for lab work in:  10 days for BMET and Dig level hold Digoxin that morning   Your physician recommends that you schedule a follow-up appointment in:  4-6 weeks in clinic 3 months with Dr. Haroldine Laws 2 weeks with Afib clinic    Do the following things EVERYDAY: Weigh yourself in the morning before breakfast. Write it down and keep it in a log. Take your medicines as prescribed Eat low salt foods--Limit salt (sodium) to 2000 mg per day.  Stay as active as you can everyday Limit all fluids for the day to less than 2 liters

## 2022-03-30 NOTE — Progress Notes (Signed)
ReDS Vest / Clip - 03/30/22 0900       ReDS Vest / Clip   Station Marker C    Ruler Value 27    ReDS Value Range Moderate volume overload    ReDS Actual Value 38    Anatomical Comments sitting

## 2022-03-31 ENCOUNTER — Other Ambulatory Visit: Payer: Self-pay | Admitting: *Deleted

## 2022-03-31 DIAGNOSIS — Z01818 Encounter for other preprocedural examination: Secondary | ICD-10-CM

## 2022-04-01 ENCOUNTER — Other Ambulatory Visit: Payer: Self-pay | Admitting: Internal Medicine

## 2022-04-01 LAB — MISC LABCORP TEST (SEND OUT): Labcorp test code: 143000

## 2022-04-06 ENCOUNTER — Other Ambulatory Visit (HOSPITAL_COMMUNITY): Payer: Self-pay

## 2022-04-06 NOTE — Progress Notes (Signed)
Paramedicine Encounter    Patient ID: [name], male    DOB: 02-24-1951, 71 y.o.   MRN: [id number]   Arrived for home visit for [name] who reports feeling much better this week but still having some wheezing. He has lost 10lbs since our last visit and stated he has not had much swelling in his ankles. He reports that he has been urinating often and is feeling like the increase of Torsemide has helped.   Vitals obtained as noted: WT- 137lbs BP- 102/60 HR- 74 O2- 97% RR- 16 CBG- 131  Upon listening to lung sounds I noted rhonchi throughout with some wheezing on inspiration. He also notes having a cough with no mucus production. He says he gets short winded on exertion but not as bad as last week. I was able to contact his PCP and get him an appointment for two weeks out, however I advised him if his symptoms persist and continue to cause him discomfort to seek medical attention at Urgent Care or ER. He agreed and said right now it's maintainable and he is feeling better than last week.   We reviewed meds and confirmed same. He is taking all correctly.   We reviewed upcoming appointments and confirmed same- I wrote these down for him for reminders.   No lower leg edema noted.   Refills: NONE   I plan to see [name] in two weeks and he knows to reach out in the meantime if needed for follow up.  Home visit complete.   [name], [name] 04/06/2022      Patient Care Team: [name], MD as PCP - General (Family Medicine) [name], MD as PCP - Cardiology (Cardiology) Thompson Grayer, MD as PCP - Electrophysiology (Cardiology)  Patient Active Problem List   Diagnosis Date Noted   Persistent atrial fibrillation ([place]) 03/23/2022   Secondary hypercoagulable state ([place]) 03/23/2022   Hypothyroidism 09/30/2021   Acute renal failure superimposed on stage 3a chronic kidney disease ([place]) 09/30/2021   Elevated troponin 09/30/2021   Normal anion gap  metabolic acidosis 07/86/7544   Acute on chronic systolic (congestive) heart failure ([place]) 09/30/2021   Acute on chronic systolic CHF (congestive heart failure) ([place]) 08/30/2021   Ischemic cardiomyopathy 02/11/2020   Left bundle branch block 01/08/2020   Squamous cell carcinoma of bronchus in left lower lobe ([place]) 11/22/2018   Nodule of lower lobe of left lung 10/27/2018   OSA (obstructive sleep apnea) 92/08/69   Chronic systolic CHF (congestive heart failure) ([place]) 07/04/2018   S/P TAVR (transcatheter aortic valve replacement) 05/15/2018   History of CVA (cerebrovascular accident)    Cardiomyopathy ([place]) 11/11/2016   Aortic stenosis, severe 06/27/2014   AAA (abdominal aortic aneurysm) (HCC)    Insomnia    Hypertension    SOB (shortness of breath)    Insulin dependent type 2 diabetes mellitus ([place]) 12/28/2012   Dyslipidemia 12/28/2012   CAD (coronary artery disease), native coronary artery 02/13/2012   Hx of radiation therapy to mediastinum 08/16/1983   Malignant seminoma of mediastinum ([place]) 08/16/1983    Current Outpatient Medications:    amitriptyline (ELAVIL) 25 MG tablet, Take 25 mg by mouth at bedtime. , Disp: , Rfl:    B Complex-C (SUPER B COMPLEX PO), Take 1 tablet by mouth daily., Disp: , Rfl:    B-D ULTRAFINE III SHORT PEN 31G X 8 MM MISC, 1 each by Other route daily as needed (GLUCOSE TESTING (INSULINE NEEDLE)). , Disp: , Rfl:  dapagliflozin propanediol (FARXIGA) 10 MG TABS tablet, Take 1 tablet (10 mg total) by mouth daily., Disp: 90 tablet, Rfl: 3   digoxin (LANOXIN) 0.125 MG tablet, Take 1 tablet (0.125 mg total) by mouth daily., Disp: 30 tablet, Rfl: 3   insulin detemir (LEVEMIR) 100 UNIT/ML injection, Inject 10-18 Units into the skin See admin instructions. 18 units in the morning, 10 units at bedtime, Disp: , Rfl:    insulin lispro (HUMALOG) 100 UNIT/ML KwikPen, Inject 2-3 Units into the skin See admin instructions. Per sliding scale at Breakfast and Dinner, Disp:  , Rfl:    levothyroxine (SYNTHROID, LEVOTHROID) 50 MCG tablet, Take 50 mcg by mouth daily before breakfast. , Disp: , Rfl:    losartan (COZAAR) 25 MG tablet, TAKE 1 TABLET(25 MG) BY MOUTH DAILY, Disp: 90 tablet, Rfl: 3   metFORMIN (GLUCOPHAGE) 500 MG tablet, Take 2 tablets (1,000 mg total) by mouth 2 (two) times daily with a meal., Disp: , Rfl:    metoprolol succinate (TOPROL-XL) 100 MG 24 hr tablet, Take 0.5 tablets (50 mg total) by mouth daily. Take with or immediately following a meal., Disp: , Rfl:    Multiple Vitamin (MULTIVITAMIN) tablet, Take 1 tablet by mouth daily., Disp: , Rfl:    ONE TOUCH ULTRA TEST test strip, 1 each by Other route as needed for other. , Disp: , Rfl:    potassium chloride SA (KLOR-CON M) 20 MEQ tablet, Take 2 tablets (40 mEq total) by mouth daily., Disp: 180 tablet, Rfl: 3   rivaroxaban (XARELTO) 20 MG TABS tablet, Take 1 tablet (20 mg total) by mouth daily with supper., Disp: 30 tablet, Rfl: 3   simvastatin (ZOCOR) 20 MG tablet, Take 20 mg by mouth every evening. , Disp: , Rfl:    spironolactone (ALDACTONE) 25 MG tablet, TAKE 1 TABLET(25 MG) BY MOUTH DAILY, Disp: 30 tablet, Rfl: 3   torsemide (DEMADEX) 20 MG tablet, Take 2 tablets (40 mg total) by mouth daily., Disp: 180 tablet, Rfl: 3   zolpidem (AMBIEN) 5 MG tablet, Take 5 mg by mouth at bedtime as needed for sleep., Disp: , Rfl:  Allergies  Allergen Reactions   Atorvastatin Other (See Comments)    Muscle pain Other reaction(s): cramps Other reaction(s): cramps   Entresto [Sacubitril-Valsartan] Other (See Comments)    hypotension   Adhesive [Tape] Rash     Social History   Socioeconomic History   Marital status: Married    Spouse name: Not on file   Number of children: Not on file   Years of education: Not on file   Highest education level: Not on file  Occupational History   Occupation: security guard    Comment: [employer]  Tobacco Use   Smoking status: Former    Types: Cigarettes    Quit date:  03/06/1985    Years since quitting: 37.1   Smokeless tobacco: Never   Tobacco comments:    Former smoker 03/23/22  Vaping Use   Vaping Use: Never used  Substance and Sexual Activity   Alcohol use: No   Drug use: No   Sexual activity: Not Currently  Other Topics Concern   Not on file  Social History Narrative   Not on file   Social Determinants of Health   Financial Resource Strain: Low Risk  (09/06/2021)   Overall Financial Resource Strain (CARDIA)    Difficulty of Paying Living Expenses: Not very hard  Recent Concern: Financial Resource Strain - High Risk (08/31/2021)   Overall Financial Resource Strain (  CARDIA)    Difficulty of Paying Living Expenses: Hard  Food Insecurity: No Food Insecurity (08/31/2021)   Hunger Vital Sign    Worried About Running Out of Food in the Last Year: Never true    Ran Out of Food in the Last Year: Never true  Transportation Needs: No Transportation Needs (08/31/2021)   PRAPARE - Hydrologist (Medical): No    Lack of Transportation (Non-Medical): No  Physical Activity: Not on file  Stress: Not on file  Social Connections: Not on file  Intimate Partner Violence: Not on file    Physical Exam      Future Appointments  Date Time Provider [place]  04/11/2022  8:00 AM [facility] 3 - EM [facility]  04/11/2022  9:00 AM Serafina Mitchell, MD [facility]  04/12/2022  8:30 AM [facility] LAB [facility] None  04/19/2022  9:25 AM [facility] DEVICE REMOTES [facility]  05/11/2022  7:10 AM [facility] DEVICE REMOTES [facility]  05/13/2022  8:30 AM [facility] PA/NP [facility] None  05/26/2022 11:15 AM Gery Pray, MD [facility] None  07/04/2022  9:00 AM Bensimhon, Shaune Pascal, MD [facility] None  08/10/2022  7:10 AM [facility] DEVICE REMOTES [facility]     ACTION: Home visit completed

## 2022-04-11 ENCOUNTER — Ambulatory Visit (HOSPITAL_COMMUNITY)
Admission: RE | Admit: 2022-04-11 | Discharge: 2022-04-11 | Disposition: A | Discharge status: Home / Self Care | Payer: Medicare Other | Source: Ambulatory Visit | Attending: Surgery | Admitting: Surgery

## 2022-04-11 ENCOUNTER — Encounter: Payer: Self-pay | Admitting: Surgery

## 2022-04-11 ENCOUNTER — Ambulatory Visit (INDEPENDENT_AMBULATORY_CARE_PROVIDER_SITE_OTHER): Payer: Medicare Other | Admitting: Surgery

## 2022-04-11 VITALS — BP 109/65 | HR 89 | Temp 98.7°F | Resp 20 | Ht 70.0 in | Wt 137.7 lb

## 2022-04-11 DIAGNOSIS — I255 Ischemic cardiomyopathy: Secondary | ICD-10-CM | POA: Diagnosis not present

## 2022-04-11 DIAGNOSIS — Z01818 Encounter for other preprocedural examination: Secondary | ICD-10-CM | POA: Diagnosis not present

## 2022-04-11 DIAGNOSIS — I5022 Chronic systolic (congestive) heart failure: Secondary | ICD-10-CM | POA: Diagnosis not present

## 2022-04-11 NOTE — Progress Notes (Signed)
Vascular and Vein Specialist of [facility]  Patient name: [name] MRN: [id number] DOB: 23-May-1951 Sex: male   REQUESTING PROVIDER:   Dr. Haroldine Laws   REASON FOR CONSULT:    Barostim evaluation  HISTORY OF PRESENT ILLNESS:   [name] is a 71 y.o. male, who is referred for evaluation of Barostim implant.  He was last admitted and February 2023 with volume overload.  His ejection fraction remains less than 20%.  He is on goal-directed medical therapy   The patient has a history of squamous cell lung cancer in 1985.  He suffers from coronary artery disease, status post CABG in 2013.  He had a TAVR for aortic stenosis in 2019.  He has a Environmental health practitioner CRT-D.  He is medically managed for hypertension.  He takes a statin for hypercholesterolemia.  He has a insulin-dependent diabetic with hemoglobin A1c of 8.3 he suffers from CKD 3 AA with baseline creatinine around 1.6  PAST MEDICAL HISTORY    Past Medical History:  Diagnosis Date   AICD (automatic cardioverter/defibrillator) present 02/11/2020   Anemia    Arthritis    Atrial tachycardia (HCC)    Chronic systolic CHF (congestive heart failure) ([place]) 07/04/2018   CKD (chronic kidney disease), stage III ([place])    Coronary artery disease 02/2012   a. s/p stenting in 2013  b. s/p CABGx2V (SVG--> PDA, SVG--> LCx).   Diabetes mellitus    neuropathy  insulin dependent   Dilated aortic root (HCC)    Dyslipidemia    Erectile dysfunction    GERD (gastroesophageal reflux disease)    History of CVA (cerebrovascular accident)    History of kidney stones    History of radiation therapy 12/03/2018-12/13/2018   left lung  Dr Gery Pray   Hx of radiation therapy to mediastinum 1985   Hypertension    Malignant seminoma of mediastinum ([place]) 1985   OSA (obstructive sleep apnea) 07/04/2018   Severe obstructive sleep apnea with an AHI of 30/h and mild central sleep apnea at 13.7/h with oxygen desaturations  as low as 79%.  Intolerant to PAP therapy   S/P TAVR (transcatheter aortic valve replacement) 05/15/2018   29 mm Edwards Sapien 3 transcatheter heart valve placed via percutaneous right transfemoral approach    Severe aortic stenosis      FAMILY HISTORY   Family History  Problem Relation Age of Onset   Diabetes Father    Diabetes Mother    Heart failure Mother    Hypertension Mother    Cancer Brother    Diabetes Sister     SOCIAL HISTORY:   Social History   Socioeconomic History   Marital status: Married    Spouse name: Not on file   Number of children: Not on file   Years of education: Not on file   Highest education level: Not on file  Occupational History   Occupation: security guard    Comment: [employer]  Tobacco Use   Smoking status: Former    Types: Cigarettes    Quit date: 03/06/1985    Years since quitting: 37.1   Smokeless tobacco: Never   Tobacco comments:    Former smoker 03/23/22  Vaping Use   Vaping Use: Never used  Substance and Sexual Activity   Alcohol use: No   Drug use: No   Sexual activity: Not Currently  Other Topics Concern   Not on file  Social History Narrative   Not on file   Social Determinants of Health  Financial Resource Strain: Low Risk  (09/06/2021)   Overall Financial Resource Strain (CARDIA)    Difficulty of Paying Living Expenses: Not very hard  Recent Concern: Financial Resource Strain - High Risk (08/31/2021)   Overall Financial Resource Strain (CARDIA)    Difficulty of Paying Living Expenses: Hard  Food Insecurity: No Food Insecurity (08/31/2021)   Hunger Vital Sign    Worried About Running Out of Food in the Last Year: Never true    Ran Out of Food in the Last Year: Never true  Transportation Needs: No Transportation Needs (08/31/2021)   PRAPARE - Hydrologist (Medical): No    Lack of Transportation (Non-Medical): No  Physical Activity: Not on file  Stress: Not on file  Social Connections:  Not on file  Intimate Partner Violence: Not on file    ALLERGIES:    Allergies  Allergen Reactions   Atorvastatin Other (See Comments)    Muscle pain Other reaction(s): cramps Other reaction(s): cramps   Entresto [Sacubitril-Valsartan] Other (See Comments)    hypotension   Adhesive [Tape] Rash    CURRENT MEDICATIONS:    Current Outpatient Medications  Medication Sig Dispense Refill   amitriptyline (ELAVIL) 25 MG tablet Take 25 mg by mouth at bedtime.      B Complex-C (SUPER B COMPLEX PO) Take 1 tablet by mouth daily.     B-D ULTRAFINE III SHORT PEN 31G X 8 MM MISC 1 each by Other route daily as needed (GLUCOSE TESTING (INSULINE NEEDLE)).      dapagliflozin propanediol (FARXIGA) 10 MG TABS tablet Take 1 tablet (10 mg total) by mouth daily. 90 tablet 3   digoxin (LANOXIN) 0.125 MG tablet Take 1 tablet (0.125 mg total) by mouth daily. 30 tablet 3   insulin detemir (LEVEMIR) 100 UNIT/ML injection Inject 10-18 Units into the skin See admin instructions. 18 units in the morning, 10 units at bedtime     insulin lispro (HUMALOG) 100 UNIT/ML KwikPen Inject 2-3 Units into the skin See admin instructions. Per sliding scale at Breakfast and Dinner     levothyroxine (SYNTHROID, LEVOTHROID) 50 MCG tablet Take 50 mcg by mouth daily before breakfast.      losartan (COZAAR) 25 MG tablet TAKE 1 TABLET(25 MG) BY MOUTH DAILY 90 tablet 3   metFORMIN (GLUCOPHAGE) 500 MG tablet Take 2 tablets (1,000 mg total) by mouth 2 (two) times daily with a meal.     metoprolol succinate (TOPROL-XL) 100 MG 24 hr tablet Take 0.5 tablets (50 mg total) by mouth daily. Take with or immediately following a meal.     Multiple Vitamin (MULTIVITAMIN) tablet Take 1 tablet by mouth daily.     ONE TOUCH ULTRA TEST test strip 1 each by Other route as needed for other.      potassium chloride SA (KLOR-CON M) 20 MEQ tablet Take 2 tablets (40 mEq total) by mouth daily. 180 tablet 3   rivaroxaban (XARELTO) 20 MG TABS tablet Take 1  tablet (20 mg total) by mouth daily with supper. 30 tablet 3   simvastatin (ZOCOR) 20 MG tablet Take 20 mg by mouth every evening.      spironolactone (ALDACTONE) 25 MG tablet TAKE 1 TABLET(25 MG) BY MOUTH DAILY 30 tablet 3   torsemide (DEMADEX) 20 MG tablet Take 2 tablets (40 mg total) by mouth daily. 180 tablet 3   zolpidem (AMBIEN) 5 MG tablet Take 5 mg by mouth at bedtime as needed for sleep.  No current facility-administered medications for this visit.    REVIEW OF SYSTEMS:   [X]  denotes positive finding, [ ]  denotes negative finding Cardiac  Comments:  Chest pain or chest pressure:    Shortness of breath upon exertion: x   Short of breath when lying flat: x   Irregular heart rhythm:        Vascular    Pain in calf, thigh, or hip brought on by ambulation:    Pain in feet at night that wakes you up from your sleep:     Blood clot in your veins:    Leg swelling:         Pulmonary    Oxygen at home:    Productive cough:     Wheezing:         Neurologic    Sudden weakness in arms or legs:     Sudden numbness in arms or legs:     Sudden onset of difficulty speaking or slurred speech:    Temporary loss of vision in one eye:     Problems with dizziness:         Gastrointestinal    Blood in stool:      Vomited blood:         Genitourinary    Burning when urinating:     Blood in urine:        Psychiatric    Major depression:         Hematologic    Bleeding problems:    Problems with blood clotting too easily:        Skin    Rashes or ulcers:        Constitutional    Fever or chills:     PHYSICAL EXAM:   Vitals:   04/11/22 0826  BP: 109/65  Pulse: 89  Resp: 20  Temp: 98.7 F (37.1 C)  SpO2: 96%  Weight: 137 lb 11.2 oz (62.5 kg)  Height: 5\' 10"  (1.778 m)    GENERAL: The patient is a well-nourished male, in no acute distress. The vital signs are documented above. CARDIAC: There is a regular rate and rhythm.  VASCULAR: SonoSite was used to evaluate  the right carotid bifurcation which is slightly above the hyoid PULMONARY: Nonlabored respirations  MUSCULOSKELETAL: There are no major deformities or cyanosis. NEUROLOGIC: No focal weakness or paresthesias are detected. SKIN: There are no ulcers or rashes noted. PSYCHIATRIC: The patient has a normal affect.  STUDIES:   I have reviewed his carotid duplex with the following findings: Right Carotid: Velocities in the right ICA are consistent with a 1-39%  stenosis.   Left Carotid: Velocities in the left ICA are consistent with a 1-39%  stenosis.   Vertebrals:  Bilateral vertebral arteries demonstrate antegrade flow.  Subclavians: Normal flow hemodynamics were seen in bilateral subclavian               arteries.  ASSESSMENT and PLAN   I discussed with the patient that I feel he is a excellent candidate for Barostim therapy.  We discussed the mechanism of action, the details of the device implant, and the expected results.  All of his questions were answered today.  We will work on Biochemist, clinical and get him scheduled.   Leia Alf, MD, FACS Vascular and Vein Specialists of [facility] [phone] Pager [phone]

## 2022-04-12 ENCOUNTER — Ambulatory Visit (HOSPITAL_COMMUNITY)
Admission: RE | Admit: 2022-04-12 | Discharge: 2022-04-12 | Disposition: A | Discharge status: Home / Self Care | Payer: Medicare Other | Source: Ambulatory Visit | Attending: Cardiology | Admitting: Cardiology

## 2022-04-12 ENCOUNTER — Telehealth (HOSPITAL_COMMUNITY): Payer: Self-pay

## 2022-04-12 DIAGNOSIS — I5022 Chronic systolic (congestive) heart failure: Secondary | ICD-10-CM | POA: Insufficient documentation

## 2022-04-12 LAB — BASIC METABOLIC PANEL
Anion gap: 13 (ref 5–15)
BUN: 31 mg/dL — ABNORMAL HIGH (ref 8–23)
CO2: 25 mmol/L (ref 22–32)
Calcium: 9 mg/dL (ref 8.9–10.3)
Chloride: 97 mmol/L — ABNORMAL LOW (ref 98–111)
Creatinine, Ser: 1.3 mg/dL — ABNORMAL HIGH (ref 0.61–1.24)
GFR, Estimated: 58 mL/min — ABNORMAL LOW (ref >60)
Glucose, Bld: 378 mg/dL — ABNORMAL HIGH (ref 70–99)
Potassium: 5.1 mmol/L (ref 3.5–5.1)
Sodium: 135 mmol/L (ref 135–145)

## 2022-04-12 NOTE — Telephone Encounter (Signed)
Reviewed labs and updated patient of med changes and encouraged him to keep his upcoming scheduled appointment with PCP in regards to elevated blood sugar. He agreed with plans. Call complete. I will follow up in the home in one week.   [name], [place] 04/12/2022     Tiney Rouge [place], CMA  04/12/2022 12:09 PM EDT Back to Top    Tried calling patient to review his lab results and left a voice message to return a call to our office.   [place], [place]  04/12/2022 10:04 AM EDT     Blood sugar is very elevated, he needs PCP follow up for this. Kidney function stable, BUN improving.   Please decrease daily KCL to 20 meQ daily

## 2022-04-13 ENCOUNTER — Telehealth (HOSPITAL_COMMUNITY): Payer: Self-pay | Admitting: *Deleted

## 2022-04-13 MED ORDER — POTASSIUM CHLORIDE CRYS ER 20 MEQ PO TBCR: 20.0000 meq | EXTENDED_RELEASE_TABLET | Freq: Every day | ORAL | 3 refills | Status: DC

## 2022-04-13 NOTE — Telephone Encounter (Signed)
Harvie Junior, [place]  04/13/2022  2:16 PM EDT Back to Top    Pt returned call and is  aware of results and agreeable with plan.    Lezlie Octave, RN  04/13/2022 11:11 AM EDT     Called patient and left message to call us back. 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 Son did not have work # for [place]. He will tell his father to call us as well. HF number provided.    Rockwell Alexandria, CMA  04/12/2022 12:09 PM EDT     Tried calling patient to review his lab results and left a voice message to return a call to our office.   Perla, [place]  04/12/2022 10:04 AM EDT     Blood sugar is very elevated, he needs PCP follow up for this. Kidney function stable, BUN improving.   Please decrease daily KCL to 20 meQ daily

## 2022-04-14 ENCOUNTER — Telehealth (HOSPITAL_COMMUNITY): Payer: Self-pay

## 2022-04-14 NOTE — Telephone Encounter (Signed)
[name] called me reporting he had a severe episode of chest pain last night around 930 and again at 230 this morning.   He reports the pain being chest pressure in the center of the chest, wheezing with some shortness of breath, pain radiating to the right shoulder blade. He reports the pain feels better when moving around. He states the pain was a 7/10. He states that this is happening more frequently, almost daily for the past 4 days.   He reports he is compliant with his medications and had labs drawn in clinic earlier this week with a decrease in potassium which he has been taking.   He denied symptoms this morning and reports feeling okay other than coughing with some wheezing.   I walked him through transmitting his device this morning and it was successful.  I advised him I would reach out to clinic and have them advise.   Call placed with VM left and telephone encounter placed in his chart as well. I will continue to follow up.   [name], [place] 04/14/2022

## 2022-04-19 ENCOUNTER — Ambulatory Visit (INDEPENDENT_AMBULATORY_CARE_PROVIDER_SITE_OTHER): Payer: Medicare Other

## 2022-04-19 DIAGNOSIS — I5022 Chronic systolic (congestive) heart failure: Secondary | ICD-10-CM

## 2022-04-19 DIAGNOSIS — Z95810 Presence of automatic (implantable) cardiac defibrillator: Secondary | ICD-10-CM | POA: Diagnosis not present

## 2022-04-20 ENCOUNTER — Telehealth: Payer: Self-pay

## 2022-04-20 ENCOUNTER — Other Ambulatory Visit (HOSPITAL_COMMUNITY): Payer: Self-pay

## 2022-04-20 NOTE — Telephone Encounter (Signed)
ICM Call to patient.  He reports feeling very tired and generally not feeling well.  Reviewed 9/5 Merlin remote transmission and report showing AT/AF burden to 46%.  Burden has progressively worsened.  He was referred to afib clinic and started on Xarelto on 8/9.   Will route to device clinic triage for further evaluation and if needs more follow up.

## 2022-04-20 NOTE — Progress Notes (Signed)
Paramedicine Encounter    Patient ID: [name], male    DOB: 15-Apr-1951, 71 y.o.   MRN: [id number]  Arrived for home visit for [name] who reports not feeling well over the last few weeks. He reports increased fatigue with shortness of breath and feeling very tired all the time. He stated he has not had any near syncope episodes or chest pain but says that he continues to have wheezing sometimes at night when he lays down and ongoing issues with general weakness and decreased energy.   When asking about his diabetes management  he admits that he has had high glucose readings (example yesterday it was 310) and states that he has missed several doses of insulin and skipped meals over the last few weeks. We discussed the disease process of diabetes and the importance of maintaining blood sugars especially given his cardiac history as well. I educated him on the dosings of his insulin and dietary tips. He feels like the inconsistency is what is making him feel bad. I explained to him this could contribute to it for sure and that he needs to better control his diabetes to help him feel better. He agreed with plan and says he is going to try to do better with his diet and insulin dosings. He uses a [employer] to monitor his sugar now and agrees to monitor it more closely.   I suggested [place] seeing if we can be seen in the afib and HF clinics sooner and he suggested to monitor his glucose and try to correct this before he schedules any more appointments.   Barostem approval is pending scheduling at this time.   I obtained vitals as noted: WT- 130lbs BP- 98/52 HR- 99 (irregular) O2- 96% RR- 16  CBG- 186    He is compliant with oral pills daily. He is weighing daily and has lost 7 lbs since our last visit two weeks ago. He has no lower leg swelling and denied same. Lungs are clear today.   I reviewed all meds and confirmed same.  Refills: Xarelto (called into [facility])  We reviewed upcoming  appointments. He will see PCP tomorrow and agreed to schedule an appointment with his endocrinologist.   I plan to see Sarah in one week in the home. He agreed with plan. Home visit complete.   -As of 1655 he reports feeling okay right now and says ICD clinic reached out and he will submit a transmission tomorrow and they will follow up as well. I will continue to follow.   [name], [place] 04/20/2022      Patient Care Team: [name], MD as PCP - General (Family Medicine) [name], MD as PCP - Cardiology (Cardiology) Thompson Grayer, MD as PCP - Electrophysiology (Cardiology)  Patient Active Problem List   Diagnosis Date Noted   Persistent atrial fibrillation ([place]) 03/23/2022   Secondary hypercoagulable state ([place]) 03/23/2022   Hypothyroidism 09/30/2021   Acute renal failure superimposed on stage 3a chronic kidney disease ([place]) 09/30/2021   Elevated troponin 09/30/2021   Normal anion gap metabolic acidosis 14/43/1540   Acute on chronic systolic (congestive) heart failure ([place]) 09/30/2021   Acute on chronic systolic CHF (congestive heart failure) ([place]) 08/30/2021   Ischemic cardiomyopathy 02/11/2020   Left bundle branch block 01/08/2020   Squamous cell carcinoma of bronchus in left lower lobe ([place]) 11/22/2018   Nodule of lower lobe of left lung 10/27/2018   OSA (obstructive sleep apnea) 07/04/2018   Chronic  systolic CHF (congestive heart failure) ([place]) 07/04/2018   S/P TAVR (transcatheter aortic valve replacement) 05/15/2018   History of CVA (cerebrovascular accident)    Cardiomyopathy ([place]) 11/11/2016   Aortic stenosis, severe 06/27/2014   AAA (abdominal aortic aneurysm) (HCC)    Insomnia    Hypertension    SOB (shortness of breath)    Insulin dependent type 2 diabetes mellitus ([place]) 12/28/2012   Dyslipidemia 12/28/2012   CAD (coronary artery disease), native coronary artery 02/13/2012   Hx of radiation therapy to mediastinum 08/16/1983    Malignant seminoma of mediastinum ([place]) 08/16/1983    Current Outpatient Medications:    amitriptyline (ELAVIL) 25 MG tablet, Take 25 mg by mouth at bedtime. , Disp: , Rfl:    B Complex-C (SUPER B COMPLEX PO), Take 1 tablet by mouth daily., Disp: , Rfl:    B-D ULTRAFINE III SHORT PEN 31G X 8 MM MISC, 1 each by Other route daily as needed (GLUCOSE TESTING (INSULINE NEEDLE)). , Disp: , Rfl:    dapagliflozin propanediol (FARXIGA) 10 MG TABS tablet, Take 1 tablet (10 mg total) by mouth daily., Disp: 90 tablet, Rfl: 3   digoxin (LANOXIN) 0.125 MG tablet, Take 1 tablet (0.125 mg total) by mouth daily., Disp: 30 tablet, Rfl: 3   insulin detemir (LEVEMIR) 100 UNIT/ML injection, Inject 10-18 Units into the skin See admin instructions. 18 units in the morning, 10 units at bedtime, Disp: , Rfl:    insulin lispro (HUMALOG) 100 UNIT/ML KwikPen, Inject 2-3 Units into the skin See admin instructions. Per sliding scale at Breakfast and Dinner, Disp: , Rfl:    levothyroxine (SYNTHROID, LEVOTHROID) 50 MCG tablet, Take 50 mcg by mouth daily before breakfast. , Disp: , Rfl:    losartan (COZAAR) 25 MG tablet, TAKE 1 TABLET(25 MG) BY MOUTH DAILY, Disp: 90 tablet, Rfl: 3   metFORMIN (GLUCOPHAGE) 500 MG tablet, Take 2 tablets (1,000 mg total) by mouth 2 (two) times daily with a meal., Disp: , Rfl:    metoprolol succinate (TOPROL-XL) 100 MG 24 hr tablet, Take 0.5 tablets (50 mg total) by mouth daily. Take with or immediately following a meal., Disp: , Rfl:    Multiple Vitamin (MULTIVITAMIN) tablet, Take 1 tablet by mouth daily., Disp: , Rfl:    ONE TOUCH ULTRA TEST test strip, 1 each by Other route as needed for other. , Disp: , Rfl:    potassium chloride SA (KLOR-CON M) 20 MEQ tablet, Take 1 tablet (20 mEq total) by mouth daily., Disp: 180 tablet, Rfl: 3   rivaroxaban (XARELTO) 20 MG TABS tablet, Take 1 tablet (20 mg total) by mouth daily with supper., Disp: 30 tablet, Rfl: 3   simvastatin (ZOCOR) 20 MG tablet, Take  20 mg by mouth every evening. , Disp: , Rfl:    spironolactone (ALDACTONE) 25 MG tablet, TAKE 1 TABLET(25 MG) BY MOUTH DAILY, Disp: 30 tablet, Rfl: 3   torsemide (DEMADEX) 20 MG tablet, Take 2 tablets (40 mg total) by mouth daily., Disp: 180 tablet, Rfl: 3   zolpidem (AMBIEN) 5 MG tablet, Take 5 mg by mouth at bedtime as needed for sleep., Disp: , Rfl:  Allergies  Allergen Reactions   Atorvastatin Other (See Comments)    Muscle pain Other reaction(s): cramps Other reaction(s): cramps   Entresto [Sacubitril-Valsartan] Other (See Comments)    hypotension   Adhesive [Tape] Rash     Social History   Socioeconomic History   Marital status: Married    Spouse name: Not on file  Number of children: Not on file   Years of education: Not on file   Highest education level: Not on file  Occupational History   Occupation: security guard    Comment: [employer]  Tobacco Use   Smoking status: Former    Types: Cigarettes    Quit date: 03/06/1985    Years since quitting: 37.1   Smokeless tobacco: Never   Tobacco comments:    Former smoker 03/23/22  Vaping Use   Vaping Use: Never used  Substance and Sexual Activity   Alcohol use: No   Drug use: No   Sexual activity: Not Currently  Other Topics Concern   Not on file  Social History Narrative   Not on file   Social Determinants of Health   Financial Resource Strain: Low Risk  (09/06/2021)   Overall Financial Resource Strain (CARDIA)    Difficulty of Paying Living Expenses: Not very hard  Recent Concern: Financial Resource Strain - High Risk (08/31/2021)   Overall Financial Resource Strain (CARDIA)    Difficulty of Paying Living Expenses: Hard  Food Insecurity: No Food Insecurity (08/31/2021)   Hunger Vital Sign    Worried About Running Out of Food in the Last Year: Never true    Ran Out of Food in the Last Year: Never true  Transportation Needs: No Transportation Needs (08/31/2021)   PRAPARE - Hydrologist  (Medical): No    Lack of Transportation (Non-Medical): No  Physical Activity: Not on file  Stress: Not on file  Social Connections: Not on file  Intimate Partner Violence: Not on file    Physical Exam      Future Appointments  Date Time Provider [place]  05/11/2022  7:10 AM [facility] DEVICE REMOTES [facility]  05/13/2022  8:30 AM [facility] PA/NP [facility] None  05/26/2022 11:15 AM Gery Pray, MD [facility] None  07/04/2022  9:00 AM Bensimhon, Shaune Pascal, MD [facility] None  08/10/2022  7:10 AM [facility] DEVICE REMOTES [facility]     ACTION: Home visit completed

## 2022-04-20 NOTE — Telephone Encounter (Signed)
Called patient to send manual transmission. States he is not at home but will be home around 6:30 pm and send. States he is feeling better now. Advised patient I will call to f/u tomorrow.

## 2022-04-20 NOTE — Progress Notes (Signed)
EPIC Encounter for ICM Monitoring  Patient Name: [name] is a 71 y.o. male Date: 04/20/2022 Primary Care Physican: [name], MD Primary Cardiologist: Turner/Bensimohn Electrophysiologist: Lovena Le 10/20/2021 Weight: 140 lbs 11/02/2021 Weight: 144 - 145 lbs 12/28/2021 Weight: 152 lbs 01/31/2022 Weight: 139 lbs (baseline is 143-145 lbs) 04/20/2022 Weight: 128 - 130 lbs   AT/AF Burden:  increased to 46% (previously was 8.7% - taking Xarelto)                                                             Spoke with patient and heart failure questions reviewed.  Pt reports feeling extremely tired.  Pain between shoulder blades and and intensifies with movement. He thinks the pain is more muscle related and has PCP appointment tomorrow.    CorVue thoracic impedance suggesting normal fluid levels.  Report suggesting AT/AF burden increasing since 7/31 report.    Referred to device clinic for further evaluation of increased burden.   Prescribed: Torsemide 20 mg take 2 tablets (40 mg total) daily.   Potassium 20 mEq take 1 tablet by mouth daily.  Spironolactone 25 mg take 1 tablet by mouth daily. Farxiga 10 mg take 1 tablet by mouth daily.   Labs: 04/12/2022 Creatinine 1.30, BUN 31, Potassium 5.1, Sodium 135, GFR 58 03/30/2022 Creatinine 1.29, BUN 42, Potassium 4.1, Sodium 138, GFR 59  03/08/2022 Creatinine 1.12, BUN 22, Potassium 46, Sodium 138, GFR >60  12/07/2021 Creatinine 1.20, BUN 17, Potassium 4.8, Sodium 137, GFR >60 10/21/2021 Creatinine 1.18, BUN 21, Potassium 4.0, Sodium 137, GFR >60 A complete set of results can be found in Results Review.   Recommendations:  No changes and encouraged to call if experiencing any fluid symptoms.   Follow-up plan: ICM clinic phone appointment on 05/30/2022.  91 day device clinic remote transmission 05/11/2022.     EP/Cardiology Office Visits:  05/13/2022 with Advanced HF clinic.   Recall 01/28/2023 with Dr Lovena Le.     Copy of ICM check sent to Dr.  Lovena Le and Allena Katz NP at [facility] clinic as FYI regarding increase AT/AF Burden.     3 month ICM trend: 04/19/2022.    12-14 Month ICM trend:     [name], RN 04/20/2022 4:05 PM

## 2022-04-21 ENCOUNTER — Telehealth (HOSPITAL_COMMUNITY): Payer: Self-pay

## 2022-04-21 ENCOUNTER — Ambulatory Visit
Admission: RE | Admit: 2022-04-21 | Discharge: 2022-04-21 | Disposition: A | Discharge status: Home / Self Care | Payer: Medicare Other | Source: Ambulatory Visit | Attending: Family Medicine | Admitting: Family Medicine

## 2022-04-21 ENCOUNTER — Other Ambulatory Visit: Payer: Self-pay

## 2022-04-21 ENCOUNTER — Other Ambulatory Visit: Payer: Self-pay | Admitting: Family Medicine

## 2022-04-21 DIAGNOSIS — R0602 Shortness of breath: Secondary | ICD-10-CM | POA: Diagnosis not present

## 2022-04-21 DIAGNOSIS — I5022 Chronic systolic (congestive) heart failure: Secondary | ICD-10-CM

## 2022-04-21 DIAGNOSIS — N1831 Chronic kidney disease, stage 3a: Secondary | ICD-10-CM | POA: Diagnosis not present

## 2022-04-21 DIAGNOSIS — Z952 Presence of prosthetic heart valve: Secondary | ICD-10-CM | POA: Diagnosis not present

## 2022-04-21 DIAGNOSIS — D500 Iron deficiency anemia secondary to blood loss (chronic): Secondary | ICD-10-CM | POA: Diagnosis not present

## 2022-04-21 DIAGNOSIS — E039 Hypothyroidism, unspecified: Secondary | ICD-10-CM | POA: Diagnosis not present

## 2022-04-21 DIAGNOSIS — Z79899 Other long term (current) drug therapy: Secondary | ICD-10-CM | POA: Diagnosis not present

## 2022-04-21 NOTE — Telephone Encounter (Signed)
Spoke to [name]- he reports he saw PCP today and they preformed a chest x-ray but he doesn't know the results yet.   Device clinic needing [place] to transmit his device- he reports he did one last night but he will do it when he gets home tonight.    He is at pharmacy picking up meds right now at [facility] and says his Xarelto is $150 and he cannot afford same so he did not pick it up and is out of it at present. I will forward this call to [place] for assistance as maybe he can get some patient assistance for this and samples in the mean time.   I will be back in office on Monday to follow up.   [name], [place] [facility]  [phone] 04/21/2022

## 2022-04-22 ENCOUNTER — Other Ambulatory Visit (HOSPITAL_COMMUNITY): Payer: Self-pay

## 2022-04-22 ENCOUNTER — Telehealth (HOSPITAL_COMMUNITY): Payer: Self-pay

## 2022-04-22 MED ORDER — RIVAROXABAN 20 MG PO TABS: 20.0000 mg | ORAL_TABLET | Freq: Every day | ORAL | 0 refills | Status: AC

## 2022-04-22 NOTE — Telephone Encounter (Signed)
Left message letting pt know I have left samples up front for pickup as well as patient assistance forms for Xarelto.

## 2022-04-22 NOTE — Telephone Encounter (Signed)
Called [name] to let him know that Stacy left samples of Xarelto up at the front desk for him. He said that he will pick them up Monday morning

## 2022-04-22 NOTE — Progress Notes (Signed)
Message received from Dr Acquanetta Sit, Champ Mungo, MD  Elizebeth Kluesner Panda, RN; Varney Daily, RN Merrilee Seashore, schedule Mr. Cotton a followup with me due to increased afib burden. GT

## 2022-04-25 NOTE — Telephone Encounter (Signed)
Confirmed with [name] about Xarelto samples- he reported he was on the way to pick them up as we were on the phone. Home visit planned for Wednesday- will confirm patient assistance paperwork completed.   [name], [place] 04/25/2022

## 2022-04-26 NOTE — Telephone Encounter (Signed)
Pt asked me to give him a call at 8:30 am in the morning to help him send manual transmission.

## 2022-04-27 ENCOUNTER — Other Ambulatory Visit (HOSPITAL_COMMUNITY): Payer: Self-pay

## 2022-04-27 ENCOUNTER — Inpatient Hospital Stay: Admit: 2022-04-27 | Payer: Medicare Other

## 2022-04-27 ENCOUNTER — Encounter (HOSPITAL_COMMUNITY): Payer: Self-pay

## 2022-04-27 ENCOUNTER — Ambulatory Visit (HOSPITAL_BASED_OUTPATIENT_CLINIC_OR_DEPARTMENT_OTHER)
Admission: RE | Admit: 2022-04-27 | Discharge: 2022-04-27 | Disposition: A | Discharge status: Home / Self Care | Payer: Medicare Other | Source: Ambulatory Visit | Attending: Family Medicine | Admitting: Family Medicine

## 2022-04-27 VITALS — BP 108/60 | HR 104 | Wt 136.8 lb

## 2022-04-27 DIAGNOSIS — Z515 Encounter for palliative care: Secondary | ICD-10-CM | POA: Diagnosis not present

## 2022-04-27 DIAGNOSIS — Z79899 Other long term (current) drug therapy: Secondary | ICD-10-CM | POA: Insufficient documentation

## 2022-04-27 DIAGNOSIS — I4819 Other persistent atrial fibrillation: Secondary | ICD-10-CM | POA: Diagnosis not present

## 2022-04-27 DIAGNOSIS — E43 Unspecified severe protein-calorie malnutrition: Secondary | ICD-10-CM | POA: Diagnosis not present

## 2022-04-27 DIAGNOSIS — J9859 Other diseases of mediastinum, not elsewhere classified: Secondary | ICD-10-CM | POA: Diagnosis not present

## 2022-04-27 DIAGNOSIS — D649 Anemia, unspecified: Secondary | ICD-10-CM | POA: Diagnosis not present

## 2022-04-27 DIAGNOSIS — N200 Calculus of kidney: Secondary | ICD-10-CM | POA: Diagnosis not present

## 2022-04-27 DIAGNOSIS — I130 Hypertensive heart and chronic kidney disease with heart failure and stage 1 through stage 4 chronic kidney disease, or unspecified chronic kidney disease: Secondary | ICD-10-CM | POA: Insufficient documentation

## 2022-04-27 DIAGNOSIS — R52 Pain, unspecified: Secondary | ICD-10-CM | POA: Diagnosis not present

## 2022-04-27 DIAGNOSIS — R0602 Shortness of breath: Secondary | ICD-10-CM | POA: Insufficient documentation

## 2022-04-27 DIAGNOSIS — Z01810 Encounter for preprocedural cardiovascular examination: Secondary | ICD-10-CM | POA: Diagnosis not present

## 2022-04-27 DIAGNOSIS — I452 Bifascicular block: Secondary | ICD-10-CM | POA: Diagnosis present

## 2022-04-27 DIAGNOSIS — I255 Ischemic cardiomyopathy: Secondary | ICD-10-CM | POA: Diagnosis not present

## 2022-04-27 DIAGNOSIS — Z95810 Presence of automatic (implantable) cardiac defibrillator: Secondary | ICD-10-CM | POA: Diagnosis not present

## 2022-04-27 DIAGNOSIS — M1990 Unspecified osteoarthritis, unspecified site: Secondary | ICD-10-CM | POA: Diagnosis not present

## 2022-04-27 DIAGNOSIS — I350 Nonrheumatic aortic (valve) stenosis: Secondary | ICD-10-CM | POA: Insufficient documentation

## 2022-04-27 DIAGNOSIS — Z952 Presence of prosthetic heart valve: Secondary | ICD-10-CM

## 2022-04-27 DIAGNOSIS — Z79890 Hormone replacement therapy (postmenopausal): Secondary | ICD-10-CM | POA: Diagnosis not present

## 2022-04-27 DIAGNOSIS — E11649 Type 2 diabetes mellitus with hypoglycemia without coma: Secondary | ICD-10-CM | POA: Diagnosis not present

## 2022-04-27 DIAGNOSIS — E1122 Type 2 diabetes mellitus with diabetic chronic kidney disease: Secondary | ICD-10-CM | POA: Insufficient documentation

## 2022-04-27 DIAGNOSIS — Z794 Long term (current) use of insulin: Secondary | ICD-10-CM | POA: Diagnosis not present

## 2022-04-27 DIAGNOSIS — E039 Hypothyroidism, unspecified: Secondary | ICD-10-CM | POA: Diagnosis present

## 2022-04-27 DIAGNOSIS — Z951 Presence of aortocoronary bypass graft: Secondary | ICD-10-CM | POA: Insufficient documentation

## 2022-04-27 DIAGNOSIS — K219 Gastro-esophageal reflux disease without esophagitis: Secondary | ICD-10-CM | POA: Diagnosis not present

## 2022-04-27 DIAGNOSIS — N1831 Chronic kidney disease, stage 3a: Secondary | ICD-10-CM | POA: Insufficient documentation

## 2022-04-27 DIAGNOSIS — I5022 Chronic systolic (congestive) heart failure: Secondary | ICD-10-CM

## 2022-04-27 DIAGNOSIS — I2510 Atherosclerotic heart disease of native coronary artery without angina pectoris: Secondary | ICD-10-CM

## 2022-04-27 DIAGNOSIS — I959 Hypotension, unspecified: Secondary | ICD-10-CM | POA: Diagnosis not present

## 2022-04-27 DIAGNOSIS — R531 Weakness: Secondary | ICD-10-CM | POA: Diagnosis not present

## 2022-04-27 DIAGNOSIS — R590 Localized enlarged lymph nodes: Secondary | ICD-10-CM | POA: Diagnosis not present

## 2022-04-27 DIAGNOSIS — C3432 Malignant neoplasm of lower lobe, left bronchus or lung: Secondary | ICD-10-CM | POA: Diagnosis not present

## 2022-04-27 DIAGNOSIS — I4892 Unspecified atrial flutter: Secondary | ICD-10-CM | POA: Diagnosis not present

## 2022-04-27 DIAGNOSIS — E1165 Type 2 diabetes mellitus with hyperglycemia: Secondary | ICD-10-CM | POA: Diagnosis present

## 2022-04-27 DIAGNOSIS — C782 Secondary malignant neoplasm of pleura: Secondary | ICD-10-CM | POA: Diagnosis not present

## 2022-04-27 DIAGNOSIS — Z953 Presence of xenogenic heart valve: Secondary | ICD-10-CM | POA: Diagnosis not present

## 2022-04-27 DIAGNOSIS — I5084 End stage heart failure: Secondary | ICD-10-CM | POA: Diagnosis present

## 2022-04-27 DIAGNOSIS — I5023 Acute on chronic systolic (congestive) heart failure: Secondary | ICD-10-CM | POA: Diagnosis not present

## 2022-04-27 DIAGNOSIS — G4733 Obstructive sleep apnea (adult) (pediatric): Secondary | ICD-10-CM | POA: Insufficient documentation

## 2022-04-27 DIAGNOSIS — Z66 Do not resuscitate: Secondary | ICD-10-CM | POA: Diagnosis not present

## 2022-04-27 DIAGNOSIS — Z7901 Long term (current) use of anticoagulants: Secondary | ICD-10-CM | POA: Insufficient documentation

## 2022-04-27 DIAGNOSIS — G893 Neoplasm related pain (acute) (chronic): Secondary | ICD-10-CM | POA: Diagnosis not present

## 2022-04-27 DIAGNOSIS — E785 Hyperlipidemia, unspecified: Secondary | ICD-10-CM | POA: Diagnosis present

## 2022-04-27 DIAGNOSIS — R0789 Other chest pain: Secondary | ICD-10-CM | POA: Insufficient documentation

## 2022-04-27 DIAGNOSIS — E1140 Type 2 diabetes mellitus with diabetic neuropathy, unspecified: Secondary | ICD-10-CM | POA: Diagnosis present

## 2022-04-27 DIAGNOSIS — I480 Paroxysmal atrial fibrillation: Secondary | ICD-10-CM | POA: Diagnosis not present

## 2022-04-27 DIAGNOSIS — E1142 Type 2 diabetes mellitus with diabetic polyneuropathy: Secondary | ICD-10-CM | POA: Diagnosis not present

## 2022-04-27 DIAGNOSIS — E440 Moderate protein-calorie malnutrition: Secondary | ICD-10-CM | POA: Diagnosis not present

## 2022-04-27 DIAGNOSIS — J90 Pleural effusion, not elsewhere classified: Secondary | ICD-10-CM | POA: Diagnosis not present

## 2022-04-27 DIAGNOSIS — Z87891 Personal history of nicotine dependence: Secondary | ICD-10-CM | POA: Diagnosis not present

## 2022-04-27 DIAGNOSIS — Z8673 Personal history of transient ischemic attack (TIA), and cerebral infarction without residual deficits: Secondary | ICD-10-CM | POA: Diagnosis not present

## 2022-04-27 DIAGNOSIS — Z7984 Long term (current) use of oral hypoglycemic drugs: Secondary | ICD-10-CM | POA: Insufficient documentation

## 2022-04-27 DIAGNOSIS — N183 Chronic kidney disease, stage 3 unspecified: Secondary | ICD-10-CM | POA: Diagnosis not present

## 2022-04-27 DIAGNOSIS — Z681 Body mass index (BMI) 19 or less, adult: Secondary | ICD-10-CM | POA: Diagnosis not present

## 2022-04-27 DIAGNOSIS — K7689 Other specified diseases of liver: Secondary | ICD-10-CM | POA: Diagnosis not present

## 2022-04-27 DIAGNOSIS — Z7189 Other specified counseling: Secondary | ICD-10-CM | POA: Diagnosis not present

## 2022-04-27 DIAGNOSIS — I447 Left bundle-branch block, unspecified: Secondary | ICD-10-CM | POA: Diagnosis not present

## 2022-04-27 DIAGNOSIS — R64 Cachexia: Secondary | ICD-10-CM | POA: Diagnosis not present

## 2022-04-27 DIAGNOSIS — C3490 Malignant neoplasm of unspecified part of unspecified bronchus or lung: Secondary | ICD-10-CM | POA: Diagnosis not present

## 2022-04-27 DIAGNOSIS — I700 Atherosclerosis of aorta: Secondary | ICD-10-CM | POA: Diagnosis not present

## 2022-04-27 DIAGNOSIS — R634 Abnormal weight loss: Secondary | ICD-10-CM | POA: Diagnosis not present

## 2022-04-27 LAB — BASIC METABOLIC PANEL
Anion gap: 9 (ref 5–15)
BUN: 32 mg/dL — ABNORMAL HIGH (ref 8–23)
CO2: 28 mmol/L (ref 22–32)
Calcium: 9 mg/dL (ref 8.9–10.3)
Chloride: 100 mmol/L (ref 98–111)
Creatinine, Ser: 1.09 mg/dL (ref 0.61–1.24)
GFR, Estimated: >60 mL/min (ref >60)
Glucose, Bld: 91 mg/dL (ref 70–99)
Potassium: 4.4 mmol/L (ref 3.5–5.1)
Sodium: 137 mmol/L (ref 135–145)

## 2022-04-27 LAB — CBC
HCT: 35.7 % — ABNORMAL LOW (ref 39.0–52.0)
Hemoglobin: 11.4 g/dL — ABNORMAL LOW (ref 13.0–17.0)
MCH: 23.3 pg — ABNORMAL LOW (ref 26.0–34.0)
MCHC: 31.9 g/dL (ref 30.0–36.0)
MCV: 72.9 fL — ABNORMAL LOW (ref 80.0–100.0)
Platelets: 457 10*3/uL — ABNORMAL HIGH (ref 150–400)
RBC: 4.9 MIL/uL (ref 4.22–5.81)
RDW: 17.7 % — ABNORMAL HIGH (ref 11.5–15.5)
WBC: 8.6 10*3/uL (ref 4.0–10.5)
nRBC: 0 % (ref 0.0–0.2)

## 2022-04-27 LAB — DIGOXIN LEVEL: Digoxin Level: 0.9 ng/mL (ref 0.8–2.0)

## 2022-04-27 LAB — BRAIN NATRIURETIC PEPTIDE: B Natriuretic Peptide: 1466.6 pg/mL — ABNORMAL HIGH (ref 0.0–100.0)

## 2022-04-27 NOTE — Progress Notes (Signed)
Paramedicine Encounter    Patient ID: [name], male    DOB: 10-14-1950, 71 y.o.   MRN: [id number]    Arrived for home visit for [name] who was seated on his couch alert and oriented reporting he was not doing good. He states he is weak, tired, short of breath, and wheezing. He says he has not been able to sleep and has been more short of breath over the last week.   On assessment I noted rhonchi throughout while listening to lung sounds, JVD present, lower ankle swelling.  He says he was seen by PCP last week and they increased his torsemide to 80mg  daily but he feels it has not helped. He stated he has not been urinating much either over the last week. He has a dru cough, but says he has been coughing up some pink tinged mucus over the last few days. He denied fever, chills. He did say his chest felt tight globally this did not increase on palpation or inhalation.   Vitals were obtained: WT- 132lbs BP- 120/62 HR- 105 irregular O2- 100% RR-18 CBG- 97   He did take his medications today including only two torsemide as he says he has been taking the other two in the evening. Per his PCP last week.   I contacted the HF clinic and [name] was able to work him into APP clinic today at 1:30. I will plan to be there with [name] to follow up on care plan. [name] agrees with plan.    -Xarelto: He obtained samples on Monday from clinic. I assisted him in completing Xarelto patient assistance and will plan to turn in same for him today.   Patient Care Team: Bill Amel, MD as PCP - General (Family Medicine) Bill Margarita, MD as PCP - Cardiology (Cardiology) Bill Grayer, MD as PCP - Electrophysiology (Cardiology)  Patient Active Problem List   Diagnosis Date Noted   Persistent atrial fibrillation ([place]) 03/23/2022   Secondary hypercoagulable state ([place]) 03/23/2022   Hypothyroidism 09/30/2021   Acute renal failure superimposed on stage 3a chronic kidney disease ([place]) 09/30/2021    Elevated troponin 09/30/2021   Normal anion gap metabolic acidosis 45/36/4680   Acute on chronic systolic (congestive) heart failure ([place]) 09/30/2021   Acute on chronic systolic CHF (congestive heart failure) ([place]) 08/30/2021   Ischemic cardiomyopathy 02/11/2020   Left bundle branch block 01/08/2020   Squamous cell carcinoma of bronchus in left lower lobe ([place]) 11/22/2018   Nodule of lower lobe of left lung 10/27/2018   OSA (obstructive sleep apnea) 32/07/2481   Chronic systolic CHF (congestive heart failure) ([place]) 07/04/2018   S/P TAVR (transcatheter aortic valve replacement) 05/15/2018   History of CVA (cerebrovascular accident)    Cardiomyopathy ([place]) 11/11/2016   Aortic stenosis, severe 06/27/2014   AAA (abdominal aortic aneurysm) (HCC)    Insomnia    Hypertension    SOB (shortness of breath)    Insulin dependent type 2 diabetes mellitus ([place]) 12/28/2012   Dyslipidemia 12/28/2012   CAD (coronary artery disease), native coronary artery 02/13/2012   Hx of radiation therapy to mediastinum 08/16/1983   Malignant seminoma of mediastinum ([place]) 08/16/1983    Current Outpatient Medications:    amitriptyline (ELAVIL) 25 MG tablet, Take 25 mg by mouth at bedtime. , Disp: , Rfl:    B Complex-C (SUPER B COMPLEX PO), Take 1 tablet by mouth daily., Disp: , Rfl:    B-D ULTRAFINE III SHORT PEN 31G X 8 MM MISC, 1 each  by Other route daily as needed (GLUCOSE TESTING (INSULINE NEEDLE)). , Disp: , Rfl:    dapagliflozin propanediol (FARXIGA) 10 MG TABS tablet, Take 1 tablet (10 mg total) by mouth daily., Disp: 90 tablet, Rfl: 3   digoxin (LANOXIN) 0.125 MG tablet, Take 1 tablet (0.125 mg total) by mouth daily., Disp: 30 tablet, Rfl: 3   insulin detemir (LEVEMIR) 100 UNIT/ML injection, Inject 10-18 Units into the skin See admin instructions. 18 units in the morning, 10 units at bedtime, Disp: , Rfl:    insulin lispro (HUMALOG) 100 UNIT/ML KwikPen, Inject 2-3 Units into the skin See admin instructions.  Per sliding scale at Breakfast and Dinner, Disp: , Rfl:    levothyroxine (SYNTHROID, LEVOTHROID) 50 MCG tablet, Take 50 mcg by mouth daily before breakfast. , Disp: , Rfl:    losartan (COZAAR) 25 MG tablet, TAKE 1 TABLET(25 MG) BY MOUTH DAILY, Disp: 90 tablet, Rfl: 3   metFORMIN (GLUCOPHAGE) 500 MG tablet, Take 2 tablets (1,000 mg total) by mouth 2 (two) times daily with a meal., Disp: , Rfl:    metoprolol succinate (TOPROL-XL) 100 MG 24 hr tablet, Take 0.5 tablets (50 mg total) by mouth daily. Take with or immediately following a meal., Disp: , Rfl:    Multiple Vitamin (MULTIVITAMIN) tablet, Take 1 tablet by mouth daily., Disp: , Rfl:    ONE TOUCH ULTRA TEST test strip, 1 each by Other route as needed for other. , Disp: , Rfl:    potassium chloride SA (KLOR-CON M) 20 MEQ tablet, Take 1 tablet (20 mEq total) by mouth daily., Disp: 180 tablet, Rfl: 3   rivaroxaban (XARELTO) 20 MG TABS tablet, Take 1 tablet (20 mg total) by mouth daily with supper., Disp: 28 tablet, Rfl: 0   simvastatin (ZOCOR) 20 MG tablet, Take 20 mg by mouth every evening. , Disp: , Rfl:    spironolactone (ALDACTONE) 25 MG tablet, TAKE 1 TABLET(25 MG) BY MOUTH DAILY, Disp: 30 tablet, Rfl: 3   torsemide (DEMADEX) 20 MG tablet, Take 2 tablets (40 mg total) by mouth daily., Disp: 180 tablet, Rfl: 3   zolpidem (AMBIEN) 5 MG tablet, Take 5 mg by mouth at bedtime as needed for sleep., Disp: , Rfl:  Allergies  Allergen Reactions   Atorvastatin Other (See Comments)    Muscle pain Other reaction(s): cramps Other reaction(s): cramps   Entresto [Sacubitril-Valsartan] Other (See Comments)    hypotension   Adhesive [Tape] Rash     Social History   Socioeconomic History   Marital status: Married    Spouse name: Not on file   Number of children: Not on file   Years of education: Not on file   Highest education level: Not on file  Occupational History   Occupation: security guard    Comment: [employer]  Tobacco Use   Smoking  status: Former    Types: Cigarettes    Quit date: 03/06/1985    Years since quitting: 37.1   Smokeless tobacco: Never   Tobacco comments:    Former smoker 03/23/22  Vaping Use   Vaping Use: Never used  Substance and Sexual Activity   Alcohol use: No   Drug use: No   Sexual activity: Not Currently  Other Topics Concern   Not on file  Social History Narrative   Not on file   Social Determinants of Health   Financial Resource Strain: Low Risk  (09/06/2021)   Overall Financial Resource Strain (CARDIA)    Difficulty of Paying Living Expenses: Not very  hard  Recent Concern: Emergency planning/management officer Strain - High Risk (08/31/2021)   Overall Financial Resource Strain (CARDIA)    Difficulty of Paying Living Expenses: Hard  Food Insecurity: No Food Insecurity (08/31/2021)   Hunger Vital Sign    Worried About Running Out of Food in the Last Year: Never true    Ran Out of Food in the Last Year: Never true  Transportation Needs: No Transportation Needs (08/31/2021)   PRAPARE - Hydrologist (Medical): No    Lack of Transportation (Non-Medical): No  Physical Activity: Not on file  Stress: Not on file  Social Connections: Not on file  Intimate Partner Violence: Not on file    Physical Exam      Future Appointments  Date Time Provider [place]  05/11/2022  7:10 AM [facility] DEVICE REMOTES [facility]  05/13/2022  8:30 AM [facility] PA/NP [facility] None  05/26/2022 11:15 AM Gery Pray, MD [facility] None  05/30/2022  7:05 AM [facility] DEVICE REMOTES [facility]  05/30/2022  8:30 AM Evans Lance, MD [facility]  07/04/2022  9:00 AM Bensimhon, Shaune Pascal, MD [facility] None  08/10/2022  7:10 AM [facility] DEVICE REMOTES [facility]     ACTION: Home visit completed

## 2022-04-27 NOTE — H&P (View-Only) (Signed)
PCP: Dr Radford Pax HF Cardiologist: Dr Vaughan Browner   HPI: [name] is a 71 y.o. with a history of squamous cell lung cancer LLL 1985, CAD, CABG x2 2013 , aortic stenosis, TAVR 2019, LBBB, St Jude CRT-D, HTN, OSA, and chronic HFrEF 2019.    Had RHC/LHC with Dr Haroldine Laws for TAVR work up.    Admitted 08/30/21 with A/C HFrEF. Echo completed EF remains < 20%. Diuresed with IV lasix and transitioned to lasix 40 mg twice a day. Intolerant entresto due to hypotentsion.GDMT with losartan, toprol xl, and spiro. Discharged 09/03/21 with CPAP machine but doesn't know how to use it, weight 152 pounds.   Admitted 09/30/21 with volume overload. PICC placed with stable CO-OX. Diuresed with IV lasix and transitioned to torsemide 40 mg daily. BB cut back. D/C weight 140 pounds.   Follow up 3/23, volume mildly up, Farxiga restarted. CPX arranged showing moderate to severe HF limitation.  Follow up 7/23, volume up. Started on torsemide 20 mg daily. Seen in AF clinic after device showed AF alert on 03/14/22. Started on Xarelto [employer] preference) and arranged for DCCV after 3 weeks. Follow up 8/23, continued to feel poorly in AF, volume up. Torsemide increased to 40 daily.   Today he returns for an acute visit. Noticeable decline over past month or two, more pronounced this past week. Saw PCP last week who felt he had fluid on board, and increased torsemide to 40 bid x 1 week. Continues to feel poorly. He is SOB with stairs and occasionally walking on flat ground. More SOB and fatigued in general. Has some chest tightness. Denies palptiations, dizziness, edema, or PND/Orthopnea. Appetite ok. No fever or chills. Weight at home 132 pounds. Taking all medications. Works part time at [employer] work. Has 2 sons who live in [place] and [place].   Cardiac Testing  - CPX (3/23): Moderate to severe HF limitation w/ markedly elevated VE/VCO2 slope and blunt BP response to exercise  FVC 3.17 (89%)       FEV1 2.54 (94%)        FEV1/FVC 80 (104%)        MVV 105 (83%)        Peak RER: 1.09   Peak VO2: 15.0 (57% predicted peak VO2)  VE/VCO2 slope: 45   - Echo 1/23 EF < 20%   - Echo 2021 EF < 20%   - Echo 2020 EF 30%   - LHC/RHC 2019   Ost 1st Diag to 1st Diag lesion is 50% stenosed. Prox LAD to Mid LAD lesion is 40% stenosed. Ost RCA lesion is 100% stenosed. SVG and is normal in caliber and large. SVG and is large. Ost Cx to Prox Cx lesion is 100% stenosed. Ost LAD to Prox LAD lesion is 40% stenosed.  Ao =109/68 (86) LV = 117/29 RA = 19 RV = 54/17 PA = 62/21 (42) PCW = 40 Fick cardiac output/index = 3.5/1.78 PVR = 0.6 WU FA sat = 96% PA sat = 58%, 61% PAPi = 2.2 RA/PCWP = 0.475 CPO = 0.67 Av gradient: peak 15.0 mmHG  Mean 14.67mmHG  AVA = 0.89 cm2  ROS: All systems negative except as listed in HPI, PMH and Problem List.  SH:  Social History   Socioeconomic History   Marital status: Married    Spouse name: Not on file   Number of children: Not on file   Years of education: Not on file   Highest education level: Not on file  Occupational History   Occupation: security guard    Comment: [employer]  Tobacco Use   Smoking status: Former    Types: Cigarettes    Quit date: 03/06/1985    Years since quitting: 37.1   Smokeless tobacco: Never   Tobacco comments:    Former smoker 03/23/22  Vaping Use   Vaping Use: Never used  Substance and Sexual Activity   Alcohol use: No   Drug use: No   Sexual activity: Not Currently  Other Topics Concern   Not on file  Social History Narrative   Not on file   Social Determinants of Health   Financial Resource Strain: Low Risk  (09/06/2021)   Overall Financial Resource Strain (CARDIA)    Difficulty of Paying Living Expenses: Not very hard  Recent Concern: Financial Resource Strain - High Risk (08/31/2021)   Overall Financial Resource Strain (CARDIA)    Difficulty of Paying Living Expenses: Hard  Food Insecurity: No Food  Insecurity (08/31/2021)   Hunger Vital Sign    Worried About Running Out of Food in the Last Year: Never true    Ran Out of Food in the Last Year: Never true  Transportation Needs: No Transportation Needs (08/31/2021)   PRAPARE - Hydrologist (Medical): No    Lack of Transportation (Non-Medical): No  Physical Activity: Not on file  Stress: Not on file  Social Connections: Not on file  Intimate Partner Violence: Not on file   FH:  Family History  Problem Relation Age of Onset   Diabetes Father    Diabetes Mother    Heart failure Mother    Hypertension Mother    Cancer Brother    Diabetes Sister    Past Medical History:  Diagnosis Date   AICD (automatic cardioverter/defibrillator) present 02/11/2020   Anemia    Arthritis    Atrial tachycardia (HCC)    Chronic systolic CHF (congestive heart failure) ([place]) 07/04/2018   CKD (chronic kidney disease), stage III ([place])    Coronary artery disease 02/2012   a. s/p stenting in 2013  b. s/p CABGx2V (SVG--> PDA, SVG--> LCx).   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  Intolerant to PAP therapy   S/P TAVR (transcatheter aortic valve replacement) 05/15/2018   29 mm Edwards Sapien 3 transcatheter heart valve placed via percutaneous right transfemoral approach    Severe aortic stenosis    Current Outpatient Medications  Medication Sig Dispense Refill    amitriptyline (ELAVIL) 25 MG tablet Take 25 mg by mouth at bedtime.      B Complex-C (SUPER B COMPLEX PO) Take 1 tablet by mouth daily.     B-D ULTRAFINE III SHORT PEN 31G X 8 MM MISC 1 each by Other route daily as needed (GLUCOSE TESTING (INSULINE NEEDLE)).      dapagliflozin propanediol (FARXIGA) 10 MG TABS tablet  Take 1 tablet (10 mg total) by mouth daily. 90 tablet 3   digoxin (LANOXIN) 0.125 MG tablet Take 1 tablet (0.125 mg total) by mouth daily. 30 tablet 3   insulin detemir (LEVEMIR) 100 UNIT/ML injection Inject 10-18 Units into the skin See admin instructions. 18 units in the morning, 10 units at bedtime     insulin lispro (HUMALOG) 100 UNIT/ML KwikPen Inject 2-3 Units into the skin See admin instructions. Per sliding scale at Breakfast and Dinner     levothyroxine (SYNTHROID, LEVOTHROID) 50 MCG tablet Take 50 mcg by mouth daily before breakfast.      losartan (COZAAR) 25 MG tablet TAKE 1 TABLET(25 MG) BY MOUTH DAILY 90 tablet 3   metFORMIN (GLUCOPHAGE) 500 MG tablet Take 2 tablets (1,000 mg total) by mouth 2 (two) times daily with a meal.     metoprolol succinate (TOPROL-XL) 100 MG 24 hr tablet Take 0.5 tablets (50 mg total) by mouth daily. Take with or immediately following a meal.     Multiple Vitamin (MULTIVITAMIN) tablet Take 1 tablet by mouth daily.     ONE TOUCH ULTRA TEST test strip 1 each by Other route as needed for other.      potassium chloride SA (KLOR-CON M) 20 MEQ tablet Take 1 tablet (20 mEq total) by mouth daily. 180 tablet 3   rivaroxaban (XARELTO) 20 MG TABS tablet Take 1 tablet (20 mg total) by mouth daily with supper. 28 tablet 0   simvastatin (ZOCOR) 20 MG tablet Take 20 mg by mouth every evening.      spironolactone (ALDACTONE) 25 MG tablet TAKE 1 TABLET(25 MG) BY MOUTH DAILY 30 tablet 3   torsemide (DEMADEX) 20 MG tablet Take 2 tablets (40 mg total) by mouth daily. 180 tablet 3   zolpidem (AMBIEN) 5 MG tablet Take 5 mg by mouth at bedtime as needed for sleep.      No current facility-administered medications for this encounter.   BP 108/60   Pulse (!) 104   Wt 62.1 kg (136 lb 12.8 oz)   SpO2 95%   BMI 19.63 kg/m   Wt Readings from Last 3 Encounters:  04/27/22 62.1 kg (136 lb 12.8 oz)  04/27/22 59.9 kg (132 lb)  04/20/22 59 kg (130 lb)   PHYSICAL EXAM: General:  No resp difficulty, walked into clinic, fatigued appearing. HEENT: Normal Neck: Supple. +v waves, Carotids 2+ bilat; no bruits. No lymphadenopathy or thryomegaly appreciated. Cor: PMI nondisplaced. Irregular rate & rhythm. No rubs, gallops or murmurs. Lungs: Faint rhonchi in bases. Abdomen: Soft, nontender, nondistended. No hepatosplenomegaly. No bruits or masses. Good bowel sounds. Extremities: No cyanosis, clubbing, rash, edema Neuro: Alert & oriented x 3, cranial nerves grossly intact. Moves all 4 extremities w/o difficulty. Affect pleasant.  ECG (personally reviewed): NSR IVCD, 97 bpm  REDS: 33%  ICD interrogation (personally reviewed): CoreVue daily impedence stable, 42% AF burden.  ASSESSMENT & PLAN: Chronic Systolic Heart Failure: - s/p St Jude CRT-D 01/2020. Unable to place LV lead. Interestingly, he has a RBBB this admission and had LBBB on prior ECGs. Discussed with Dr. Lovena Le, with current RBBB a left bundle lead will not be likely to help.  - Had PYP 2020 not suggestive of amyloid.  - Echo (1/23): EF 20-25%, RV significantly reduced, RVSP 50 mmHg, moderate MR, moderate TR, mean gradient 2 mmHg across aortic valve prosthesis. - CPX (3/23): moderate to severe HF limitation. - NYHA III, volume looks OK on exam, may be on the dry side.  REDs 33%, CoreVue stable. - Continue torsemide 40 mg daily + 40 KCL daily. - Continue Farxiga 10 mg daily.  - Continue Toprol 50 mg daily.  - Continue digoxin 0.125 mg daily. - Continue losartan 25 mg daily (failed Entresto in past due to low BP).  - Continue spironolactone 25 mg daily. - Corlanor previously stopped d/t cost.  - Not  a candidate for transplant (age). Would be high risk for VAD (CABG x 2). Dr. Haroldine Laws previously had a long discussion about his situation and results of his CPX testing. Discussed advanced therapies and he is planning on Barostim implant 05/19/22. - Dr. Haroldine Laws had long discussion with him today about end-stage HF and advanced therapies. Concern he is low-output. Could consider VAD, but small size may be prohibitive. He is willing to do necessary work up to determine next steps. Has 2 sons who live nearby. - Plan [facility] tomorrow to evaluate hemodynamics and known CAD. - Check BMET, BNP and dig level today.  2. CAD - h/o CABG 2013 - LHC 2019 Severe 2v CAD with total occlusion of ostial RCA and LCx, mod non-obs CAD in LAD. - No s/s angina. - No ASA with A/C. - Continue simvastatin 20 mg daily.  - As above, plan LHC tomorrow to evaluate coronary anatomy. Hold Xarelto before cath.  3. Persistent AFib - In AFib by device 03/14/22, this is new for him. - CHA2DS2-VASc score is 5 - Continue Xarelto. No bleeding issues. - He tolerates AFib poorly. Maintaining NSR will be important. - NSR on ECG today. He has follow up with Dr. Lovena Le soon. - Continue CPAP  4. DM2 - Insulin dependent. - Hgb A1C 8.3 in 01/23 - On SGLT2i. - Managed by PCP.   5. OSA - Continue CPAP.  6. Aortic valve stenosis - S/P TAVR 2019 with 29 mm S3 - Valve okay on recent echo.   7. Hypothyroid - On levothyroxine.    8. CKD Stage IIIa - SCr baseline 1.5 -1.6  - Labs today.  Follow up with APP after cath.   [name] [place] FNP-BC 1:17 PM  Patient seen and examined with the above-signed Advanced Practice Provider and/or Housestaff. I personally reviewed laboratory data, imaging studies and relevant notes. I independently examined the patient and formulated the important aspects of the plan. I have edited the note to reflect any of my changes or salient points. I have personally discussed the plan with the  patient and/or family.  Agree with above. 71 y/o male with CAD s/p CABG and advanced systolic HF due to iCM.   Prevents for acute visit due to progressive fatigue and dyspnea over past 2 weeks. Now NYHA IV. Volume status ok. Occasional chest tightness  General:  Thin. weak appearing. No resp difficulty HEENT: normal Neck: supple. no JVD. Carotids 2+ bilat; no bruits. No lymphadenopathy or thryomegaly appreciated. Cor: PMI laterally displaced. Regular rate & rhythm. No rubs, gallops or murmurs. Lungs: clear but decreased  Abdomen: soft, nontender, nondistended. No hepatosplenomegaly. No bruits or masses. Good bowel sounds. Extremities: no cyanosis, clubbing, rash, edema Neuro: alert & orientedx3, cranial nerves grossly intact. moves all 4 extremities w/o difficulty. Affect pleasant  Volume status looks ok on exam and by ReDS. Based on recent CPX and history I suspect he has low output HF. We talked about advanced HF options. He is not transplant candidate and suspect he may not be VAD candidate but I think worth a shot to formally evaluate. Will plan R/L cath tomorrow  with potential initiation of inotrope support. I called his son to discuss as well.   Total time spent 40 minutes. Over half that time spent discussing above.    Bill Bickers, MD  11:18 PM

## 2022-04-27 NOTE — Progress Notes (Signed)
Paramedicine Encounter    Patient ID: [name], male    DOB: Aug 16, 1950, 71 y.o.   MRN: [id number]  Met with [name] in clinic today where he was seen by Bill Katz, NP and Dr. Haroldine [name]. After review of device he was noted to not have any fluid. REDS CLIP was 33%. EKG was obtained.   [name] informed me to ensure his Torsemide is at 58m daily.   Dr. BHaroldine Lawsplanned a right and left heart cath for tomorrow morning.   Labs obtained.   Overall plan reviewed and Dr. BHaroldine Lawsexplained options of care goals with [name] voiced he is willing to try whatever it takes to feel better. We will follow up after cath lab visit tomorrow and I will follow up in the home pending plan.   Pill box filled for one week including med holds with cath tomorrow.   Refills: Bill GladeMetformin   ACTION: Home visit completed

## 2022-04-27 NOTE — Patient Instructions (Signed)
EKG done today.  Labs done today. We will contact you only if your labs are abnormal.  No other medication changes were made. Please continue all current medications as prescribed.  Your physician recommends that you keep your scheduled follow-up appointment.   If you have any questions or concerns before your next appointment please send [place] a message through [place] or call our office at [phone].    TO LEAVE A MESSAGE FOR THE NURSE SELECT OPTION 2, PLEASE LEAVE A MESSAGE INCLUDING: YOUR NAME DATE OF BIRTH CALL BACK NUMBER REASON FOR CALL**this is important as we prioritize the call backs  YOU WILL RECEIVE A CALL BACK THE SAME DAY AS LONG AS YOU CALL BEFORE 4:00 PM   Do the following things EVERYDAY: Weigh yourself in the morning before breakfast. Write it down and keep it in a log. Take your medicines as prescribed Eat low salt foods--Limit salt (sodium) to 2000 mg per day.  Stay as active as you can everyday Limit all fluids for the day to less than 2 liters   At the [facility], you and your health needs are our priority. As part of our continuing mission to provide you with exceptional heart care, we have created designated Provider Care Teams. These Care Teams include your primary Cardiologist (physician) and Advanced Practice Providers (APPs- Physician Assistants and Nurse Practitioners) who all work together to provide you with the care you need, when you need it.   You may see any of the following providers on your designated Care Team at your next follow up: Dr Glori Bickers Dr Haynes Kerns, NP Lyda Jester, [place] Audry Riles, PharmD   Please be sure to bring in all your medications bottles to every appointment.   You are scheduled for a Cardiac Catheterization on Thursday, September 14 with Dr. Glori Bickers.  1. Please arrive at the [facility] (Main Entrance A) at [facility]: [place], [place]  [id number] at 6:00 AM (This time is two hours before your procedure to ensure your preparation). Free valet parking service is available.   Special note: Every effort is made to have your procedure done on time. Please understand that emergencies sometimes delay scheduled procedures.  2. Diet: Do not eat solid foods after midnight.  The patient may have clear liquids until 5am upon the day of the procedure.   4. Medication instructions in preparation for your procedure:  Stop taking Xarelto (Rivaroxaban) on Wednesday, September 13. Restart on Friday 04/27/2022  Stop taking, Torsemide (Demadex) Thursday, September 14,Restart on Friday 04/27/2022  Take only 1/2 units of insulin the night before your procedure. Do not take any insulin on the day of the procedure.  Do not take Diabetes Med Glucophage (Metformin) on the day of the procedure and HOLD 48 HOURS AFTER THE PROCEDURE.  Do not take Wilder Glade on the day of your procedure and HOLD [place].  On the morning of your procedure, take your any morning medicines NOT listed above.  You may use sips of water.  5. Plan for one night stay--bring personal belongings. 6. Bring a current list of your medications and current insurance cards. 7. You MUST have a responsible person to drive you home. 8. Someone MUST be with you the first 24 hours after you arrive home or your discharge will be delayed. 9. Please wear clothes that are easy to get on and off and wear slip-on shoes.  Thank you for  allowing [place] to care for you!   -- [facility]

## 2022-04-27 NOTE — Progress Notes (Signed)
PCP: Dr Radford Pax HF Cardiologist: Dr Vaughan Browner   HPI: [name] is a 71 y.o. with a history of squamous cell lung cancer LLL 1985, CAD, CABG x2 2013 , aortic stenosis, TAVR 2019, LBBB, St Jude CRT-D, HTN, OSA, and chronic HFrEF 2019.    Had RHC/LHC with Dr Haroldine Laws for TAVR work up.    Admitted 08/30/21 with A/C HFrEF. Echo completed EF remains < 20%. Diuresed with IV lasix and transitioned to lasix 40 mg twice a day. Intolerant entresto due to hypotentsion.GDMT with losartan, toprol xl, and spiro. Discharged 09/03/21 with CPAP machine but doesn't know how to use it, weight 152 pounds.   Admitted 09/30/21 with volume overload. PICC placed with stable CO-OX. Diuresed with IV lasix and transitioned to torsemide 40 mg daily. BB cut back. D/C weight 140 pounds.   Follow up 3/23, volume mildly up, Farxiga restarted. CPX arranged showing moderate to severe HF limitation.  Follow up 7/23, volume up. Started on torsemide 20 mg daily. Seen in AF clinic after device showed AF alert on 03/14/22. Started on Xarelto [employer] preference) and arranged for DCCV after 3 weeks. Follow up 8/23, continued to feel poorly in AF, volume up. Torsemide increased to 40 daily.   Today he returns for an acute visit. Noticeable decline over past month or two, more pronounced this past week. Saw PCP last week who felt he had fluid on board, and increased torsemide to 40 bid x 1 week. Continues to feel poorly. He is SOB with stairs and occasionally walking on flat ground. More SOB and fatigued in general. Has some chest tightness. Denies palptiations, dizziness, edema, or PND/Orthopnea. Appetite ok. No fever or chills. Weight at home 132 pounds. Taking all medications. Works part time at [employer] work. Has 2 sons who live in [place] and [place].   Cardiac Testing  - CPX (3/23): Moderate to severe HF limitation w/ markedly elevated VE/VCO2 slope and blunt BP response to exercise  FVC 3.17 (89%)       FEV1 2.54 (94%)        FEV1/FVC 80 (104%)        MVV 105 (83%)        Peak RER: 1.09   Peak VO2: 15.0 (57% predicted peak VO2)  VE/VCO2 slope: 45   - Echo 1/23 EF < 20%   - Echo 2021 EF < 20%   - Echo 2020 EF 30%   - LHC/RHC 2019   Ost 1st Diag to 1st Diag lesion is 50% stenosed. Prox LAD to Mid LAD lesion is 40% stenosed. Ost RCA lesion is 100% stenosed. SVG and is normal in caliber and large. SVG and is large. Ost Cx to Prox Cx lesion is 100% stenosed. Ost LAD to Prox LAD lesion is 40% stenosed.  Ao =109/68 (86) LV = 117/29 RA = 19 RV = 54/17 PA = 62/21 (42) PCW = 40 Fick cardiac output/index = 3.5/1.78 PVR = 0.6 WU FA sat = 96% PA sat = 58%, 61% PAPi = 2.2 RA/PCWP = 0.475 CPO = 0.67 Av gradient: peak 15.0 mmHG  Mean 14.45mmHG  AVA = 0.89 cm2  ROS: All systems negative except as listed in HPI, PMH and Problem List.  SH:  Social History   Socioeconomic History   Marital status: Married    Spouse name: Not on file   Number of children: Not on file   Years of education: Not on file   Highest education level: Not on file  Occupational History   Occupation: security guard    Comment: [employer]  Tobacco Use   Smoking status: Former    Types: Cigarettes    Quit date: 03/06/1985    Years since quitting: 37.1   Smokeless tobacco: Never   Tobacco comments:    Former smoker 03/23/22  Vaping Use   Vaping Use: Never used  Substance and Sexual Activity   Alcohol use: No   Drug use: No   Sexual activity: Not Currently  Other Topics Concern   Not on file  Social History Narrative   Not on file   Social Determinants of Health   Financial Resource Strain: Low Risk  (09/06/2021)   Overall Financial Resource Strain (CARDIA)    Difficulty of Paying Living Expenses: Not very hard  Recent Concern: Financial Resource Strain - High Risk (08/31/2021)   Overall Financial Resource Strain (CARDIA)    Difficulty of Paying Living Expenses: Hard  Food Insecurity: No Food  Insecurity (08/31/2021)   Hunger Vital Sign    Worried About Running Out of Food in the Last Year: Never true    Ran Out of Food in the Last Year: Never true  Transportation Needs: No Transportation Needs (08/31/2021)   PRAPARE - Hydrologist (Medical): No    Lack of Transportation (Non-Medical): No  Physical Activity: Not on file  Stress: Not on file  Social Connections: Not on file  Intimate Partner Violence: Not on file   FH:  Family History  Problem Relation Age of Onset   Diabetes Father    Diabetes Mother    Heart failure Mother    Hypertension Mother    Cancer Brother    Diabetes Sister    Past Medical History:  Diagnosis Date   AICD (automatic cardioverter/defibrillator) present 02/11/2020   Anemia    Arthritis    Atrial tachycardia (HCC)    Chronic systolic CHF (congestive heart failure) ([place]) 07/04/2018   CKD (chronic kidney disease), stage III ([place])    Coronary artery disease 02/2012   a. s/p stenting in 2013  b. s/p CABGx2V (SVG--> PDA, SVG--> LCx).   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  Intolerant to PAP therapy   S/P TAVR (transcatheter aortic valve replacement) 05/15/2018   29 mm Edwards Sapien 3 transcatheter heart valve placed via percutaneous right transfemoral approach    Severe aortic stenosis    Current Outpatient Medications  Medication Sig Dispense Refill    amitriptyline (ELAVIL) 25 MG tablet Take 25 mg by mouth at bedtime.      B Complex-C (SUPER B COMPLEX PO) Take 1 tablet by mouth daily.     B-D ULTRAFINE III SHORT PEN 31G X 8 MM MISC 1 each by Other route daily as needed (GLUCOSE TESTING (INSULINE NEEDLE)).      dapagliflozin propanediol (FARXIGA) 10 MG TABS tablet  Take 1 tablet (10 mg total) by mouth daily. 90 tablet 3   digoxin (LANOXIN) 0.125 MG tablet Take 1 tablet (0.125 mg total) by mouth daily. 30 tablet 3   insulin detemir (LEVEMIR) 100 UNIT/ML injection Inject 10-18 Units into the skin See admin instructions. 18 units in the morning, 10 units at bedtime     insulin lispro (HUMALOG) 100 UNIT/ML KwikPen Inject 2-3 Units into the skin See admin instructions. Per sliding scale at Breakfast and Dinner     levothyroxine (SYNTHROID, LEVOTHROID) 50 MCG tablet Take 50 mcg by mouth daily before breakfast.      losartan (COZAAR) 25 MG tablet TAKE 1 TABLET(25 MG) BY MOUTH DAILY 90 tablet 3   metFORMIN (GLUCOPHAGE) 500 MG tablet Take 2 tablets (1,000 mg total) by mouth 2 (two) times daily with a meal.     metoprolol succinate (TOPROL-XL) 100 MG 24 hr tablet Take 0.5 tablets (50 mg total) by mouth daily. Take with or immediately following a meal.     Multiple Vitamin (MULTIVITAMIN) tablet Take 1 tablet by mouth daily.     ONE TOUCH ULTRA TEST test strip 1 each by Other route as needed for other.      potassium chloride SA (KLOR-CON M) 20 MEQ tablet Take 1 tablet (20 mEq total) by mouth daily. 180 tablet 3   rivaroxaban (XARELTO) 20 MG TABS tablet Take 1 tablet (20 mg total) by mouth daily with supper. 28 tablet 0   simvastatin (ZOCOR) 20 MG tablet Take 20 mg by mouth every evening.      spironolactone (ALDACTONE) 25 MG tablet TAKE 1 TABLET(25 MG) BY MOUTH DAILY 30 tablet 3   torsemide (DEMADEX) 20 MG tablet Take 2 tablets (40 mg total) by mouth daily. 180 tablet 3   zolpidem (AMBIEN) 5 MG tablet Take 5 mg by mouth at bedtime as needed for sleep.      No current facility-administered medications for this encounter.   BP 108/60   Pulse (!) 104   Wt 62.1 kg (136 lb 12.8 oz)   SpO2 95%   BMI 19.63 kg/m   Wt Readings from Last 3 Encounters:  04/27/22 62.1 kg (136 lb 12.8 oz)  04/27/22 59.9 kg (132 lb)  04/20/22 59 kg (130 lb)   PHYSICAL EXAM: General:  No resp difficulty, walked into clinic, fatigued appearing. HEENT: Normal Neck: Supple. +v waves, Carotids 2+ bilat; no bruits. No lymphadenopathy or thryomegaly appreciated. Cor: PMI nondisplaced. Irregular rate & rhythm. No rubs, gallops or murmurs. Lungs: Faint rhonchi in bases. Abdomen: Soft, nontender, nondistended. No hepatosplenomegaly. No bruits or masses. Good bowel sounds. Extremities: No cyanosis, clubbing, rash, edema Neuro: Alert & oriented x 3, cranial nerves grossly intact. Moves all 4 extremities w/o difficulty. Affect pleasant.  ECG (personally reviewed): NSR IVCD, 97 bpm  REDS: 33%  ICD interrogation (personally reviewed): CoreVue daily impedence stable, 42% AF burden.  ASSESSMENT & PLAN: Chronic Systolic Heart Failure: - s/p St Jude CRT-D 01/2020. Unable to place LV lead. Interestingly, he has a RBBB this admission and had LBBB on prior ECGs. Discussed with Dr. Lovena Le, with current RBBB a left bundle lead will not be likely to help.  - Had PYP 2020 not suggestive of amyloid.  - Echo (1/23): EF 20-25%, RV significantly reduced, RVSP 50 mmHg, moderate MR, moderate TR, mean gradient 2 mmHg across aortic valve prosthesis. - CPX (3/23): moderate to severe HF limitation. - NYHA III, volume looks OK on exam, may be on the dry side.  REDs 33%, CoreVue stable. - Continue torsemide 40 mg daily + 40 KCL daily. - Continue Farxiga 10 mg daily.  - Continue Toprol 50 mg daily.  - Continue digoxin 0.125 mg daily. - Continue losartan 25 mg daily (failed Entresto in past due to low BP).  - Continue spironolactone 25 mg daily. - Corlanor previously stopped d/t cost.  - Not  a candidate for transplant (age). Would be high risk for VAD (CABG x 2). Dr. Haroldine Laws previously had a long discussion about his situation and results of his CPX testing. Discussed advanced therapies and he is planning on Barostim implant 05/19/22. - Dr. Haroldine Laws had long discussion with him today about end-stage HF and advanced therapies. Concern he is low-output. Could consider VAD, but small size may be prohibitive. He is willing to do necessary work up to determine next steps. Has 2 sons who live nearby. - Plan [facility] tomorrow to evaluate hemodynamics and known CAD. - Check BMET, BNP and dig level today.  2. CAD - h/o CABG 2013 - LHC 2019 Severe 2v CAD with total occlusion of ostial RCA and LCx, mod non-obs CAD in LAD. - No s/s angina. - No ASA with A/C. - Continue simvastatin 20 mg daily.  - As above, plan LHC tomorrow to evaluate coronary anatomy. Hold Xarelto before cath.  3. Persistent AFib - In AFib by device 03/14/22, this is new for him. - CHA2DS2-VASc score is 5 - Continue Xarelto. No bleeding issues. - He tolerates AFib poorly. Maintaining NSR will be important. - NSR on ECG today. He has follow up with Dr. Lovena Le soon. - Continue CPAP  4. DM2 - Insulin dependent. - Hgb A1C 8.3 in 01/23 - On SGLT2i. - Managed by PCP.   5. OSA - Continue CPAP.  6. Aortic valve stenosis - S/P TAVR 2019 with 29 mm S3 - Valve okay on recent echo.   7. Hypothyroid - On levothyroxine.    8. CKD Stage IIIa - SCr baseline 1.5 -1.6  - Labs today.  Follow up with APP after cath.   Maricela Bo Milford FNP-BC 1:17 PM  Patient seen and examined with the above-signed Advanced Practice Provider and/or Housestaff. I personally reviewed laboratory data, imaging studies and relevant notes. I independently examined the patient and formulated the important aspects of the plan. I have edited the note to reflect any of my changes or salient points. I have personally discussed the plan with the  patient and/or family.  Agree with above. 71 y/o male with CAD s/p CABG and advanced systolic HF due to iCM.   Prevents for acute visit due to progressive fatigue and dyspnea over past 2 weeks. Now NYHA IV. Volume status ok. Occasional chest tightness  General:  Thin. weak appearing. No resp difficulty HEENT: normal Neck: supple. no JVD. Carotids 2+ bilat; no bruits. No lymphadenopathy or thryomegaly appreciated. Cor: PMI laterally displaced. Regular rate & rhythm. No rubs, gallops or murmurs. Lungs: clear but decreased  Abdomen: soft, nontender, nondistended. No hepatosplenomegaly. No bruits or masses. Good bowel sounds. Extremities: no cyanosis, clubbing, rash, edema Neuro: alert & orientedx3, cranial nerves grossly intact. moves all 4 extremities w/o difficulty. Affect pleasant  Volume status looks ok on exam and by ReDS. Based on recent CPX and history I suspect he has low output HF. We talked about advanced HF options. He is not transplant candidate and suspect he may not be VAD candidate but I think worth a shot to formally evaluate. Will plan R/L cath tomorrow  with potential initiation of inotrope support. I called his son to discuss as well.   Total time spent 40 minutes. Over half that time spent discussing above.    Glori Bickers, MD  11:18 PM

## 2022-04-28 ENCOUNTER — Encounter (HOSPITAL_COMMUNITY)
Admission: RE | Disposition: A | Discharge status: Home / Self Care | Payer: Self-pay | Source: Ambulatory Visit | Attending: Internal Medicine

## 2022-04-28 ENCOUNTER — Inpatient Hospital Stay (HOSPITAL_COMMUNITY)
Admission: RE | Admit: 2022-04-28 | Discharge: 2022-05-09 | DRG: 286 | Disposition: A | Discharge status: Hospice | Payer: Medicare Other | Source: Ambulatory Visit | Attending: Internal Medicine | Admitting: Internal Medicine

## 2022-04-28 ENCOUNTER — Other Ambulatory Visit: Payer: Self-pay

## 2022-04-28 ENCOUNTER — Encounter (HOSPITAL_COMMUNITY): Payer: Self-pay | Admitting: Internal Medicine

## 2022-04-28 ENCOUNTER — Inpatient Hospital Stay: Payer: Self-pay

## 2022-04-28 DIAGNOSIS — E1122 Type 2 diabetes mellitus with diabetic chronic kidney disease: Secondary | ICD-10-CM | POA: Diagnosis present

## 2022-04-28 DIAGNOSIS — E43 Unspecified severe protein-calorie malnutrition: Secondary | ICD-10-CM | POA: Diagnosis present

## 2022-04-28 DIAGNOSIS — Z85118 Personal history of other malignant neoplasm of bronchus and lung: Secondary | ICD-10-CM

## 2022-04-28 DIAGNOSIS — E11649 Type 2 diabetes mellitus with hypoglycemia without coma: Secondary | ICD-10-CM | POA: Diagnosis present

## 2022-04-28 DIAGNOSIS — Z79890 Hormone replacement therapy (postmenopausal): Secondary | ICD-10-CM

## 2022-04-28 DIAGNOSIS — Z681 Body mass index (BMI) 19 or less, adult: Secondary | ICD-10-CM

## 2022-04-28 DIAGNOSIS — Z7189 Other specified counseling: Secondary | ICD-10-CM | POA: Diagnosis not present

## 2022-04-28 DIAGNOSIS — C3432 Malignant neoplasm of lower lobe, left bronchus or lung: Secondary | ICD-10-CM | POA: Diagnosis present

## 2022-04-28 DIAGNOSIS — Z7901 Long term (current) use of anticoagulants: Secondary | ICD-10-CM

## 2022-04-28 DIAGNOSIS — E785 Hyperlipidemia, unspecified: Secondary | ICD-10-CM | POA: Diagnosis present

## 2022-04-28 DIAGNOSIS — Z91048 Other nonmedicinal substance allergy status: Secondary | ICD-10-CM

## 2022-04-28 DIAGNOSIS — C782 Secondary malignant neoplasm of pleura: Secondary | ICD-10-CM | POA: Diagnosis present

## 2022-04-28 DIAGNOSIS — R590 Localized enlarged lymph nodes: Secondary | ICD-10-CM | POA: Diagnosis not present

## 2022-04-28 DIAGNOSIS — E1140 Type 2 diabetes mellitus with diabetic neuropathy, unspecified: Secondary | ICD-10-CM | POA: Diagnosis present

## 2022-04-28 DIAGNOSIS — I4819 Other persistent atrial fibrillation: Secondary | ICD-10-CM | POA: Diagnosis present

## 2022-04-28 DIAGNOSIS — E039 Hypothyroidism, unspecified: Secondary | ICD-10-CM | POA: Diagnosis present

## 2022-04-28 DIAGNOSIS — Z953 Presence of xenogenic heart valve: Secondary | ICD-10-CM

## 2022-04-28 DIAGNOSIS — R52 Pain, unspecified: Secondary | ICD-10-CM | POA: Diagnosis not present

## 2022-04-28 DIAGNOSIS — Z95810 Presence of automatic (implantable) cardiac defibrillator: Secondary | ICD-10-CM

## 2022-04-28 DIAGNOSIS — J9859 Other diseases of mediastinum, not elsewhere classified: Secondary | ICD-10-CM | POA: Diagnosis not present

## 2022-04-28 DIAGNOSIS — Z515 Encounter for palliative care: Secondary | ICD-10-CM

## 2022-04-28 DIAGNOSIS — M1990 Unspecified osteoarthritis, unspecified site: Secondary | ICD-10-CM | POA: Diagnosis not present

## 2022-04-28 DIAGNOSIS — K7689 Other specified diseases of liver: Secondary | ICD-10-CM | POA: Diagnosis not present

## 2022-04-28 DIAGNOSIS — Z952 Presence of prosthetic heart valve: Secondary | ICD-10-CM | POA: Diagnosis not present

## 2022-04-28 DIAGNOSIS — Z66 Do not resuscitate: Secondary | ICD-10-CM | POA: Diagnosis present

## 2022-04-28 DIAGNOSIS — K5900 Constipation, unspecified: Secondary | ICD-10-CM | POA: Diagnosis present

## 2022-04-28 DIAGNOSIS — R634 Abnormal weight loss: Secondary | ICD-10-CM | POA: Diagnosis not present

## 2022-04-28 DIAGNOSIS — I2510 Atherosclerotic heart disease of native coronary artery without angina pectoris: Secondary | ICD-10-CM | POA: Diagnosis not present

## 2022-04-28 DIAGNOSIS — I2582 Chronic total occlusion of coronary artery: Secondary | ICD-10-CM | POA: Diagnosis present

## 2022-04-28 DIAGNOSIS — I255 Ischemic cardiomyopathy: Secondary | ICD-10-CM | POA: Diagnosis present

## 2022-04-28 DIAGNOSIS — Z87891 Personal history of nicotine dependence: Secondary | ICD-10-CM | POA: Diagnosis not present

## 2022-04-28 DIAGNOSIS — Z8673 Personal history of transient ischemic attack (TIA), and cerebral infarction without residual deficits: Secondary | ICD-10-CM

## 2022-04-28 DIAGNOSIS — E1165 Type 2 diabetes mellitus with hyperglycemia: Secondary | ICD-10-CM | POA: Diagnosis present

## 2022-04-28 DIAGNOSIS — I5023 Acute on chronic systolic (congestive) heart failure: Secondary | ICD-10-CM | POA: Diagnosis present

## 2022-04-28 DIAGNOSIS — R64 Cachexia: Secondary | ICD-10-CM | POA: Diagnosis present

## 2022-04-28 DIAGNOSIS — Z79899 Other long term (current) drug therapy: Secondary | ICD-10-CM

## 2022-04-28 DIAGNOSIS — Z809 Family history of malignant neoplasm, unspecified: Secondary | ICD-10-CM

## 2022-04-28 DIAGNOSIS — I5084 End stage heart failure: Secondary | ICD-10-CM | POA: Diagnosis present

## 2022-04-28 DIAGNOSIS — C3490 Malignant neoplasm of unspecified part of unspecified bronchus or lung: Secondary | ICD-10-CM

## 2022-04-28 DIAGNOSIS — I700 Atherosclerosis of aorta: Secondary | ICD-10-CM | POA: Diagnosis not present

## 2022-04-28 DIAGNOSIS — G893 Neoplasm related pain (acute) (chronic): Secondary | ICD-10-CM | POA: Diagnosis not present

## 2022-04-28 DIAGNOSIS — I452 Bifascicular block: Secondary | ICD-10-CM | POA: Diagnosis present

## 2022-04-28 DIAGNOSIS — G4733 Obstructive sleep apnea (adult) (pediatric): Secondary | ICD-10-CM | POA: Diagnosis not present

## 2022-04-28 DIAGNOSIS — K219 Gastro-esophageal reflux disease without esophagitis: Secondary | ICD-10-CM | POA: Diagnosis not present

## 2022-04-28 DIAGNOSIS — N1831 Chronic kidney disease, stage 3a: Secondary | ICD-10-CM | POA: Diagnosis present

## 2022-04-28 DIAGNOSIS — N200 Calculus of kidney: Secondary | ICD-10-CM | POA: Diagnosis not present

## 2022-04-28 DIAGNOSIS — Z923 Personal history of irradiation: Secondary | ICD-10-CM

## 2022-04-28 DIAGNOSIS — E440 Moderate protein-calorie malnutrition: Secondary | ICD-10-CM | POA: Diagnosis not present

## 2022-04-28 DIAGNOSIS — Z794 Long term (current) use of insulin: Secondary | ICD-10-CM | POA: Diagnosis not present

## 2022-04-28 DIAGNOSIS — D649 Anemia, unspecified: Secondary | ICD-10-CM | POA: Diagnosis not present

## 2022-04-28 DIAGNOSIS — Z833 Family history of diabetes mellitus: Secondary | ICD-10-CM

## 2022-04-28 DIAGNOSIS — Z8249 Family history of ischemic heart disease and other diseases of the circulatory system: Secondary | ICD-10-CM

## 2022-04-28 DIAGNOSIS — I959 Hypotension, unspecified: Secondary | ICD-10-CM | POA: Diagnosis present

## 2022-04-28 DIAGNOSIS — I130 Hypertensive heart and chronic kidney disease with heart failure and stage 1 through stage 4 chronic kidney disease, or unspecified chronic kidney disease: Secondary | ICD-10-CM | POA: Diagnosis present

## 2022-04-28 DIAGNOSIS — Z7984 Long term (current) use of oral hypoglycemic drugs: Secondary | ICD-10-CM

## 2022-04-28 DIAGNOSIS — M545 Low back pain, unspecified: Secondary | ICD-10-CM | POA: Diagnosis present

## 2022-04-28 DIAGNOSIS — I4892 Unspecified atrial flutter: Secondary | ICD-10-CM | POA: Diagnosis present

## 2022-04-28 DIAGNOSIS — I480 Paroxysmal atrial fibrillation: Secondary | ICD-10-CM | POA: Diagnosis not present

## 2022-04-28 DIAGNOSIS — N183 Chronic kidney disease, stage 3 unspecified: Secondary | ICD-10-CM | POA: Diagnosis not present

## 2022-04-28 DIAGNOSIS — Z888 Allergy status to other drugs, medicaments and biological substances status: Secondary | ICD-10-CM

## 2022-04-28 DIAGNOSIS — I447 Left bundle-branch block, unspecified: Secondary | ICD-10-CM | POA: Diagnosis not present

## 2022-04-28 DIAGNOSIS — E1142 Type 2 diabetes mellitus with diabetic polyneuropathy: Secondary | ICD-10-CM | POA: Diagnosis not present

## 2022-04-28 DIAGNOSIS — Z951 Presence of aortocoronary bypass graft: Secondary | ICD-10-CM

## 2022-04-28 DIAGNOSIS — J90 Pleural effusion, not elsewhere classified: Secondary | ICD-10-CM | POA: Diagnosis not present

## 2022-04-28 DIAGNOSIS — I5022 Chronic systolic (congestive) heart failure: Secondary | ICD-10-CM | POA: Diagnosis not present

## 2022-04-28 DIAGNOSIS — R531 Weakness: Secondary | ICD-10-CM | POA: Diagnosis not present

## 2022-04-28 DIAGNOSIS — Z01810 Encounter for preprocedural cardiovascular examination: Secondary | ICD-10-CM | POA: Diagnosis not present

## 2022-04-28 HISTORY — PX: RIGHT/LEFT HEART CATH AND CORONARY/GRAFT ANGIOGRAPHY: CATH118267

## 2022-04-28 LAB — COMPREHENSIVE METABOLIC PANEL
ALT: 10 U/L (ref 0–44)
AST: 14 U/L — ABNORMAL LOW (ref 15–41)
Albumin: 2.4 g/dL — ABNORMAL LOW (ref 3.5–5.0)
Alkaline Phosphatase: 82 U/L (ref 38–126)
Anion gap: 10 (ref 5–15)
BUN: 27 mg/dL — ABNORMAL HIGH (ref 8–23)
CO2: 22 mmol/L (ref 22–32)
Calcium: 8.2 mg/dL — ABNORMAL LOW (ref 8.9–10.3)
Chloride: 104 mmol/L (ref 98–111)
Creatinine, Ser: 1.01 mg/dL (ref 0.61–1.24)
GFR, Estimated: >60 mL/min (ref >60)
Glucose, Bld: 438 mg/dL — ABNORMAL HIGH (ref 70–99)
Potassium: 4.3 mmol/L (ref 3.5–5.1)
Sodium: 136 mmol/L (ref 135–145)
Total Bilirubin: 0.6 mg/dL (ref 0.3–1.2)
Total Protein: 6.5 g/dL (ref 6.5–8.1)

## 2022-04-28 LAB — POCT I-STAT 7, (LYTES, BLD GAS, ICA,H+H)
Acid-base deficit: 2 mmol/L (ref 0.0–2.0)
Bicarbonate: 22.5 mmol/L (ref 20.0–28.0)
Calcium, Ion: 0.94 mmol/L — ABNORMAL LOW (ref 1.15–1.40)
HCT: 30 % — ABNORMAL LOW (ref 39.0–52.0)
Hemoglobin: 10.2 g/dL — ABNORMAL LOW (ref 13.0–17.0)
O2 Saturation: 97 %
Potassium: 3.8 mmol/L (ref 3.5–5.1)
Sodium: 142 mmol/L (ref 135–145)
TCO2: 24 mmol/L (ref 22–32)
pCO2 arterial: 36.6 mmHg (ref 32–48)
pH, Arterial: 7.396 (ref 7.35–7.45)
pO2, Arterial: 96 mmHg (ref 83–108)

## 2022-04-28 LAB — POCT I-STAT EG7
Acid-Base Excess: 3 mmol/L — ABNORMAL HIGH (ref 0.0–2.0)
Acid-Base Excess: 4 mmol/L — ABNORMAL HIGH (ref 0.0–2.0)
Bicarbonate: 28.2 mmol/L — ABNORMAL HIGH (ref 20.0–28.0)
Bicarbonate: 29.7 mmol/L — ABNORMAL HIGH (ref 20.0–28.0)
Calcium, Ion: 1.21 mmol/L (ref 1.15–1.40)
Calcium, Ion: 1.31 mmol/L (ref 1.15–1.40)
HCT: 33 % — ABNORMAL LOW (ref 39.0–52.0)
HCT: 34 % — ABNORMAL LOW (ref 39.0–52.0)
Hemoglobin: 11.2 g/dL — ABNORMAL LOW (ref 13.0–17.0)
Hemoglobin: 11.6 g/dL — ABNORMAL LOW (ref 13.0–17.0)
O2 Saturation: 47 %
O2 Saturation: 49 %
Potassium: 4.6 mmol/L (ref 3.5–5.1)
Potassium: 4.7 mmol/L (ref 3.5–5.1)
Sodium: 136 mmol/L (ref 135–145)
Sodium: 137 mmol/L (ref 135–145)
TCO2: 30 mmol/L (ref 22–32)
TCO2: 31 mmol/L (ref 22–32)
pCO2, Ven: 46 mmHg (ref 44–60)
pCO2, Ven: 46.6 mmHg (ref 44–60)
pH, Ven: 7.39 (ref 7.25–7.43)
pH, Ven: 7.418 (ref 7.25–7.43)
pO2, Ven: 26 mmHg — CL (ref 32–45)
pO2, Ven: 26 mmHg — CL (ref 32–45)

## 2022-04-28 LAB — CBC WITH DIFFERENTIAL/PLATELET
Abs Immature Granulocytes: 0.03 10*3/uL (ref 0.00–0.07)
Basophils Absolute: 0.1 10*3/uL (ref 0.0–0.1)
Basophils Relative: 1 %
Eosinophils Absolute: 0.1 10*3/uL (ref 0.0–0.5)
Eosinophils Relative: 2 %
HCT: 32.1 % — ABNORMAL LOW (ref 39.0–52.0)
Hemoglobin: 10.5 g/dL — ABNORMAL LOW (ref 13.0–17.0)
Immature Granulocytes: 0 %
Lymphocytes Relative: 13 %
Lymphs Abs: 1.1 10*3/uL (ref 0.7–4.0)
MCH: 23.3 pg — ABNORMAL LOW (ref 26.0–34.0)
MCHC: 32.7 g/dL (ref 30.0–36.0)
MCV: 71.3 fL — ABNORMAL LOW (ref 80.0–100.0)
Monocytes Absolute: 0.6 10*3/uL (ref 0.1–1.0)
Monocytes Relative: 7 %
Neutro Abs: 6 10*3/uL (ref 1.7–7.7)
Neutrophils Relative %: 77 %
Platelets: 404 10*3/uL — ABNORMAL HIGH (ref 150–400)
RBC: 4.5 MIL/uL (ref 4.22–5.81)
RDW: 17.6 % — ABNORMAL HIGH (ref 11.5–15.5)
WBC: 7.8 10*3/uL (ref 4.0–10.5)
nRBC: 0 % (ref 0.0–0.2)

## 2022-04-28 LAB — HEMOGLOBIN A1C
Hgb A1c MFr Bld: 9.6 % — ABNORMAL HIGH (ref 4.8–5.6)
Mean Plasma Glucose: 228.82 mg/dL

## 2022-04-28 LAB — COOXEMETRY PANEL
Carboxyhemoglobin: 2 % — ABNORMAL HIGH (ref 0.5–1.5)
Methemoglobin: 0.7 % (ref 0.0–1.5)
O2 Saturation: 50.5 %
Total hemoglobin: 11.2 g/dL — ABNORMAL LOW (ref 12.0–16.0)

## 2022-04-28 LAB — GLUCOSE, CAPILLARY
Glucose-Capillary: 261 mg/dL — ABNORMAL HIGH (ref 70–99)
Glucose-Capillary: 222 mg/dL — ABNORMAL HIGH (ref 70–99)
Glucose-Capillary: 163 mg/dL — ABNORMAL HIGH (ref 70–99)
Glucose-Capillary: 430 mg/dL — ABNORMAL HIGH (ref 70–99)
Glucose-Capillary: 192 mg/dL — ABNORMAL HIGH (ref 70–99)

## 2022-04-28 LAB — MAGNESIUM: Magnesium: 1.8 mg/dL (ref 1.7–2.4)

## 2022-04-28 SURGERY — RIGHT/LEFT HEART CATH AND CORONARY/GRAFT ANGIOGRAPHY
Anesthesia: LOCAL

## 2022-04-28 MED ORDER — ASPIRIN 81 MG PO CHEW: 81.0000 mg | CHEWABLE_TABLET | ORAL | Status: DC

## 2022-04-28 MED ORDER — HEPARIN (PORCINE) IN NACL 1000-0.9 UT/500ML-% IV SOLN
INTRAVENOUS | Status: DC | PRN
  Administered 2022-04-28 (×2): 500 mL

## 2022-04-28 MED ORDER — ASPIRIN 81 MG PO CHEW
81.0000 mg | CHEWABLE_TABLET | ORAL | Status: AC
  Administered 2022-04-28: 81 mg via ORAL

## 2022-04-28 MED ORDER — SODIUM CHLORIDE 0.9% FLUSH: 3.0000 mL | Freq: Two times a day (BID) | INTRAVENOUS | Status: DC

## 2022-04-28 MED ORDER — HYDRALAZINE HCL 20 MG/ML IJ SOLN: 10.0000 mg | INTRAMUSCULAR | Status: AC | PRN

## 2022-04-28 MED ORDER — DIGOXIN 125 MCG PO TABS: 0.1250 mg | ORAL_TABLET | Freq: Every day | ORAL | Status: DC

## 2022-04-28 MED ORDER — MIDAZOLAM HCL 2 MG/2ML IJ SOLN
INTRAMUSCULAR | Status: DC | PRN
  Administered 2022-04-28: 1 mg via INTRAVENOUS

## 2022-04-28 MED ORDER — SODIUM CHLORIDE 0.9 % IV SOLN: 250.0000 mL | INTRAVENOUS | Status: DC | PRN

## 2022-04-28 MED ORDER — LIDOCAINE HCL (PF) 1 % IJ SOLN
INTRAMUSCULAR | Status: DC | PRN
  Administered 2022-04-28 (×2): 2 mL via INTRADERMAL

## 2022-04-28 MED ORDER — ACETAMINOPHEN 325 MG PO TABS
650.0000 mg | ORAL_TABLET | ORAL | Status: DC | PRN
  Administered 2022-04-28 – 2022-04-29 (×2): 650 mg via ORAL
  Filled 2022-04-28 (×2): qty 2

## 2022-04-28 MED ORDER — MIDAZOLAM HCL 2 MG/2ML IJ SOLN
INTRAMUSCULAR | Status: AC
  Filled 2022-04-28: qty 2

## 2022-04-28 MED ORDER — SODIUM CHLORIDE 0.9% FLUSH
3.0000 mL | INTRAVENOUS | Status: DC | PRN
  Administered 2022-05-01: 3 mL via INTRAVENOUS

## 2022-04-28 MED ORDER — ASPIRIN 81 MG PO CHEW
CHEWABLE_TABLET | ORAL | Status: AC
  Filled 2022-04-28: qty 1

## 2022-04-28 MED ORDER — DIGOXIN 125 MCG PO TABS
0.0625 mg | ORAL_TABLET | Freq: Every day | ORAL | Status: DC
  Administered 2022-04-28 – 2022-05-09 (×12): 0.0625 mg via ORAL
  Filled 2022-04-28 (×12): qty 1

## 2022-04-28 MED ORDER — CHLORHEXIDINE GLUCONATE CLOTH 2 % EX PADS
6.0000 | MEDICATED_PAD | Freq: Every day | CUTANEOUS | Status: DC
  Administered 2022-04-28 – 2022-05-09 (×12): 6 via TOPICAL

## 2022-04-28 MED ORDER — HEPARIN (PORCINE) 25000 UT/250ML-% IV SOLN
2250.0000 [IU]/h | INTRAVENOUS | Status: DC
  Administered 2022-04-28: 900 [IU]/h via INTRAVENOUS
  Administered 2022-04-29: 1300 [IU]/h via INTRAVENOUS
  Administered 2022-04-30: 1600 [IU]/h via INTRAVENOUS
  Administered 2022-05-01: 1850 [IU]/h via INTRAVENOUS
  Administered 2022-05-01: 1950 [IU]/h via INTRAVENOUS
  Administered 2022-05-02: 2150 [IU]/h via INTRAVENOUS
  Filled 2022-04-28 (×6): qty 250

## 2022-04-28 MED ORDER — TORSEMIDE 20 MG PO TABS
40.0000 mg | ORAL_TABLET | Freq: Every day | ORAL | Status: DC
  Administered 2022-04-28 – 2022-04-29 (×2): 40 mg via ORAL
  Filled 2022-04-28 (×2): qty 2

## 2022-04-28 MED ORDER — DAPAGLIFLOZIN PROPANEDIOL 10 MG PO TABS
10.0000 mg | ORAL_TABLET | Freq: Every day | ORAL | Status: DC
  Administered 2022-04-28 – 2022-05-09 (×11): 10 mg via ORAL
  Filled 2022-04-28 (×12): qty 1

## 2022-04-28 MED ORDER — MILRINONE LACTATE IN DEXTROSE 20-5 MG/100ML-% IV SOLN
0.1250 ug/kg/min | INTRAVENOUS | Status: DC
  Administered 2022-04-28 – 2022-05-01 (×3): 0.25 ug/kg/min via INTRAVENOUS
  Administered 2022-05-02: 0.125 ug/kg/min via INTRAVENOUS
  Filled 2022-04-28 (×6): qty 100

## 2022-04-28 MED ORDER — AMITRIPTYLINE HCL 25 MG PO TABS
25.0000 mg | ORAL_TABLET | Freq: Every day | ORAL | Status: DC
  Administered 2022-04-28 – 2022-05-08 (×11): 25 mg via ORAL
  Filled 2022-04-28 (×12): qty 1

## 2022-04-28 MED ORDER — SODIUM CHLORIDE 0.9% FLUSH: 3.0000 mL | INTRAVENOUS | Status: DC | PRN

## 2022-04-28 MED ORDER — IOHEXOL 350 MG/ML SOLN
INTRAVENOUS | Status: DC | PRN
  Administered 2022-04-28: 45 mL

## 2022-04-28 MED ORDER — LEVOTHYROXINE SODIUM 50 MCG PO TABS
50.0000 ug | ORAL_TABLET | Freq: Every day | ORAL | Status: DC
  Administered 2022-04-29 – 2022-05-09 (×11): 50 ug via ORAL
  Filled 2022-04-28 (×11): qty 1

## 2022-04-28 MED ORDER — ACETAMINOPHEN 325 MG PO TABS: 650.0000 mg | ORAL_TABLET | ORAL | Status: DC | PRN

## 2022-04-28 MED ORDER — SPIRONOLACTONE 25 MG PO TABS
25.0000 mg | ORAL_TABLET | Freq: Every day | ORAL | Status: DC
  Administered 2022-04-28 – 2022-05-09 (×12): 25 mg via ORAL
  Filled 2022-04-28 (×12): qty 1

## 2022-04-28 MED ORDER — LOSARTAN POTASSIUM 25 MG PO TABS: 25.0000 mg | ORAL_TABLET | Freq: Every day | ORAL | Status: DC

## 2022-04-28 MED ORDER — INSULIN ASPART 100 UNIT/ML IJ SOLN
0.0000 [IU] | Freq: Three times a day (TID) | INTRAMUSCULAR | Status: DC
  Administered 2022-04-28: 15 [IU] via SUBCUTANEOUS
  Administered 2022-04-29: 5 [IU] via SUBCUTANEOUS
  Administered 2022-04-29: 3 [IU] via SUBCUTANEOUS
  Administered 2022-04-29 – 2022-04-30 (×2): 15 [IU] via SUBCUTANEOUS
  Administered 2022-04-30 (×2): 3 [IU] via SUBCUTANEOUS
  Administered 2022-05-01: 8 [IU] via SUBCUTANEOUS
  Administered 2022-05-01: 5 [IU] via SUBCUTANEOUS
  Administered 2022-05-02: 8 [IU] via SUBCUTANEOUS

## 2022-04-28 MED ORDER — SODIUM CHLORIDE 0.9 % IV SOLN: INTRAVENOUS | Status: DC

## 2022-04-28 MED ORDER — VERAPAMIL HCL 2.5 MG/ML IV SOLN
INTRAVENOUS | Status: DC | PRN
  Administered 2022-04-28: 10 mL via INTRA_ARTERIAL

## 2022-04-28 MED ORDER — SODIUM CHLORIDE 0.9% FLUSH
10.0000 mL | INTRAVENOUS | Status: DC | PRN
  Administered 2022-05-06: 10 mL

## 2022-04-28 MED ORDER — SODIUM CHLORIDE 0.9% FLUSH
3.0000 mL | Freq: Two times a day (BID) | INTRAVENOUS | Status: DC
  Administered 2022-04-29 – 2022-05-06 (×9): 3 mL via INTRAVENOUS

## 2022-04-28 MED ORDER — VERAPAMIL HCL 2.5 MG/ML IV SOLN
INTRAVENOUS | Status: AC
  Filled 2022-04-28: qty 2

## 2022-04-28 MED ORDER — FENTANYL CITRATE (PF) 100 MCG/2ML IJ SOLN
INTRAMUSCULAR | Status: AC
  Filled 2022-04-28: qty 2

## 2022-04-28 MED ORDER — LABETALOL HCL 5 MG/ML IV SOLN: 10.0000 mg | INTRAVENOUS | Status: AC | PRN

## 2022-04-28 MED ORDER — HEPARIN (PORCINE) IN NACL 1000-0.9 UT/500ML-% IV SOLN
INTRAVENOUS | Status: AC
  Filled 2022-04-28: qty 1000

## 2022-04-28 MED ORDER — FENTANYL CITRATE (PF) 100 MCG/2ML IJ SOLN
INTRAMUSCULAR | Status: DC | PRN
  Administered 2022-04-28 (×2): 25 ug via INTRAVENOUS

## 2022-04-28 MED ORDER — ONDANSETRON HCL 4 MG/2ML IJ SOLN: 4.0000 mg | Freq: Four times a day (QID) | INTRAMUSCULAR | Status: DC | PRN

## 2022-04-28 MED ORDER — SODIUM CHLORIDE 0.9% FLUSH
10.0000 mL | Freq: Two times a day (BID) | INTRAVENOUS | Status: DC
  Administered 2022-04-29 – 2022-05-06 (×9): 10 mL

## 2022-04-28 MED ORDER — LIDOCAINE HCL (PF) 1 % IJ SOLN
INTRAMUSCULAR | Status: AC
  Filled 2022-04-28: qty 30

## 2022-04-28 MED ORDER — ZOLPIDEM TARTRATE 5 MG PO TABS
5.0000 mg | ORAL_TABLET | Freq: Every evening | ORAL | Status: DC | PRN
  Administered 2022-04-28 – 2022-05-08 (×11): 5 mg via ORAL
  Filled 2022-04-28 (×11): qty 1

## 2022-04-28 MED ORDER — HEPARIN SODIUM (PORCINE) 1000 UNIT/ML IJ SOLN
INTRAMUSCULAR | Status: DC | PRN
  Administered 2022-04-28: 3000 [IU] via INTRAVENOUS

## 2022-04-28 MED ORDER — SODIUM CHLORIDE 0.9% FLUSH
3.0000 mL | Freq: Two times a day (BID) | INTRAVENOUS | Status: DC
  Administered 2022-04-29 – 2022-05-06 (×7): 3 mL via INTRAVENOUS

## 2022-04-28 MED ORDER — HEPARIN SODIUM (PORCINE) 1000 UNIT/ML IJ SOLN
INTRAMUSCULAR | Status: AC
  Filled 2022-04-28: qty 10

## 2022-04-28 MED ORDER — SIMVASTATIN 20 MG PO TABS
20.0000 mg | ORAL_TABLET | Freq: Every evening | ORAL | Status: DC
  Administered 2022-04-28 – 2022-05-08 (×11): 20 mg via ORAL
  Filled 2022-04-28 (×11): qty 1

## 2022-04-28 SURGICAL SUPPLY — 10 items
CATH 5FR JL3.5 JR4 ANG PIG MP (CATHETERS) IMPLANT
CATH BALLN WEDGE 5F 110CM (CATHETERS) IMPLANT
DEVICE RAD TR BAND REGULAR (VASCULAR PRODUCTS) IMPLANT
GLIDESHEATH SLEND SS 6F .021 (SHEATH) IMPLANT
GUIDEWIRE .025 260CM (WIRE) IMPLANT
GUIDEWIRE INQWIRE 1.5J.035X260 (WIRE) IMPLANT
INQWIRE 1.5J .035X260CM (WIRE) ×1
PACK CARDIAC CATHETERIZATION (CUSTOM PROCEDURE TRAY) ×1 IMPLANT
SHEATH GLIDE SLENDER 4/5FR (SHEATH) IMPLANT
TRANSDUCER W/STOPCOCK (MISCELLANEOUS) ×1 IMPLANT

## 2022-04-28 NOTE — Progress Notes (Signed)
Peripherally Inserted Central Catheter Placement  The IV Nurse has discussed with the patient and/or persons authorized to consent for the patient, the purpose of this procedure and the potential benefits and risks involved with this procedure.  The benefits include less needle sticks, lab draws from the catheter, and the patient may be discharged home with the catheter. Risks include, but not limited to, infection, bleeding, blood clot (thrombus formation), and puncture of an artery; nerve damage and irregular heartbeat and possibility to perform a PICC exchange if needed/ordered by physician.  Alternatives to this procedure were also discussed.  Bard Power PICC patient education guide, fact sheet on infection prevention and patient information card has been provided to patient /or left at bedside.    PICC Placement Documentation  PICC Double Lumen 22/29/79 Right Basilic 42 cm 0 cm (Active)  Indication for Insertion or Continuance of Line Vasoactive infusions 04/28/22 1650  Exposed Catheter (cm) 0 cm 04/28/22 1650  Site Assessment Clean, Dry, Intact 04/28/22 1650  Lumen #1 Status Saline locked;Flushed;Blood return noted 04/28/22 1650  Lumen #2 Status Saline locked;Flushed;Blood return noted 04/28/22 1650  Dressing Type Transparent;Securing device 04/28/22 1650  Dressing Status Antimicrobial disc in place;Clean, Dry, Intact 04/28/22 1650  Safety Lock Not Applicable 89/21/19 4174  Line Care Connections checked and tightened 04/28/22 1650  Line Adjustment (NICU/IV Team Only) No 04/28/22 1650  Dressing Intervention New dressing 04/28/22 1650  Dressing Change Due 05/05/22 04/28/22 [place], Sheri Prows Chenice 04/28/2022, 4:51 PM

## 2022-04-28 NOTE — Interval H&P Note (Signed)
History and Physical Interval Note:  04/28/2022 8:17 AM  [name]  has presented today for surgery, with the diagnosis of HF.  The various methods of treatment have been discussed with the patient and family. After consideration of risks, benefits and other options for treatment, the patient has consented to  Procedure(s): RIGHT/LEFT HEART CATH AND CORONARY/GRAFT ANGIOGRAPHY (N/A) as a surgical intervention.  The patient's history has been reviewed, patient examined, no change in status, stable for surgery.  I have reviewed the patient's chart and labs.  Questions were answered to the patient's satisfaction.     [name]

## 2022-04-28 NOTE — H&P (Addendum)
Advanced Heart Failure Team History and Physical Note   PCP:  [name], MD  PCP-Cardiology: Fransico Him, MD     Reason for Admission: Acute on chronic systolic CHF with low-output   HPI:   Mr Maceachern is a 71 y.o. with a history of squamous cell lung cancer LLL 1985, CAD, CABG x2 2013, aortic stenosis s/p TAVR 2019, chronic HFrEF and LBBB, St Jude CRT-D (unable to place LV lead), HTN, OSA.    Admitted 08/30/21 with A/C HFrEF. Echo with EF < 20%. Diuresed with IV lasix and transitioned to lasix 40 mg twice a day. Intolerant entresto due to hypotentsion.GDMT with losartan, toprol xl, and spiro.    Admitted 09/30/21 with volume overload. PICC placed with stable CO-OX. Diuresed with IV lasix and transitioned to torsemide 40 mg daily. BB cut back. D/C weight 140 pounds.   CPX 03/23: moderate to severe HF limitation.   Follow up 7/23, volume up. Started on torsemide 20 mg daily. Seen in AF clinic 08/09 after device showed AF alert on 03/14/22. Started on Xarelto [employer] preference) and planned to arrange DCCV. Follow up 8/23, continued to feel poorly in AF, volume up. Torsemide increased to 40 daily.   Has pending Barostim implant 05/19/22.   Seen for f/u in HF clinic yesterday for an acute visit. Noted increased dyspnea, fatigue and decline in activity tolerance. His PCP had recently increased Torsemide to 40 BID X 1 week. Volume looked okay on exam. Low-output HF suspected. Fortunately he was back in SR.  Presented for [facility] today: RA 12 PA 61/26 (39) PCWP 20 (v 27) Fick CO/CI 3.1/1.7 PVR 6.1 PAPi 2.9 PA sat 45%, 47% 3 v CAD with chronically occluded RCA and LCX. Patent SVGs to R PDA and OM. 90% p and 95% d LAD.  He is being admitted for severe low-output HF and workup for possible VAD.  Works part time at [employer] work. He has 2 sons who live nearby, one in [place] and the other in [place].   Review of Systems: [y] = yes, [ ]  = no   General:  Weight gain [ ] ; Weight loss [ ] ; Anorexia [ ] ; Fatigue [Y]; Fever [ ] ; Chills [ ] ; Weakness [Y]  Cardiac: Chest pain/pressure [ ] ; Resting SOB [ ] ; Exertional SOB [Y]; Orthopnea [ ] ; Pedal Edema [ ] ; Palpitations [ ] ; Syncope [ ] ; Presyncope [ ] ; Paroxysmal nocturnal dyspnea[ ]   Pulmonary: Cough [ ] ; Wheezing[ ] ; Hemoptysis[ ] ; Sputum [ ] ; Snoring [ ]   GI: Vomiting[ ] ; Dysphagia[ ] ; Melena[ ] ; Hematochezia [ ] ; Heartburn[ ] ; Abdominal pain [ ] ; Constipation [ ] ; Diarrhea [ ] ; BRBPR [ ]   GU: Hematuria[ ] ; Dysuria [ ] ; Nocturia[ ]   Vascular: Pain in legs with walking [ ] ; Pain in feet with lying flat [ ] ; Non-healing sores [ ] ; Stroke [ ] ; TIA [ ] ; Slurred speech [ ] ;  Neuro: Headaches[ ] ; Vertigo[ ] ; Seizures[ ] ; Paresthesias[ ] ;Blurred vision [ ] ; Diplopia [ ] ; Vision changes [ ]   Ortho/Skin: Arthritis [ ] ; Joint pain [ ] ; Muscle pain [ ] ; Joint swelling [ ] ; Back Pain [ ] ; Rash [ ]   Psych: Depression[ ] ; Anxiety[ ]   Heme: Bleeding problems [ ] ; Clotting disorders [ ] ; Anemia [ ]   Endocrine: Diabetes [Y]; Thyroid dysfunction[ Y]   Home Medications Prior to Admission medications   Medication Sig Start Date End Date Taking? Authorizing Provider  amitriptyline (ELAVIL) 25 MG tablet Take 25 mg by mouth  at bedtime.    Yes [provider]  B Complex-C (SUPER B COMPLEX PO) Take 1 tablet by mouth daily.   Yes [provider]  dapagliflozin propanediol (FARXIGA) 10 MG TABS tablet Take 1 tablet (10 mg total) by mouth daily. 11/08/21  Yes Clegg, Amy D, NP  digoxin (LANOXIN) 0.125 MG tablet Take 1 tablet (0.125 mg total) by mouth daily. 11/08/21  Yes Clegg, Amy D, NP  insulin detemir (LEVEMIR) 100 UNIT/ML injection Inject 10-18 Units into the skin See admin instructions. 18 units in the morning, 10 units at bedtime   Yes [provider]  insulin lispro (HUMALOG) 100 UNIT/ML KwikPen Inject 2-3 Units into the skin See admin instructions. Per sliding scale at Breakfast and Dinner  03/26/19  Yes [provider]  levothyroxine (SYNTHROID, LEVOTHROID) 50 MCG tablet Take 50 mcg by mouth daily before breakfast.    Yes [provider]  losartan (COZAAR) 25 MG tablet TAKE 1 TABLET(25 MG) BY MOUTH DAILY 12/16/21  Yes Seve Monette, Shaune Pascal, MD  metFORMIN (GLUCOPHAGE) 500 MG tablet Take 2 tablets (1,000 mg total) by mouth 2 (two) times daily with a meal. 05/17/18  Yes Eileen Stanford, PA-C  metoprolol succinate (TOPROL-XL) 100 MG 24 hr tablet Take 0.5 tablets (50 mg total) by mouth daily. Take with or immediately following a meal. 10/05/21  Yes Domenic Polite, MD  Multiple Vitamin (MULTIVITAMIN) tablet Take 1 tablet by mouth daily.   Yes [provider]  potassium chloride SA (KLOR-CON M) 20 MEQ tablet Take 1 tablet (20 mEq total) by mouth daily. 04/13/22  Yes Milford, Maricela Bo, FNP  rivaroxaban (XARELTO) 20 MG TABS tablet Take 1 tablet (20 mg total) by mouth daily with supper. 04/22/22  Yes Fenton, Clint R, PA  simvastatin (ZOCOR) 20 MG tablet Take 20 mg by mouth every evening.    Yes [provider]  spironolactone (ALDACTONE) 25 MG tablet TAKE 1 TABLET(25 MG) BY MOUTH DAILY 03/21/22  Yes Dayzee Trower, Shaune Pascal, MD  torsemide (DEMADEX) 20 MG tablet Take 2 tablets (40 mg total) by mouth daily. 03/30/22  Yes Milford, Maricela Bo, FNP  zolpidem (AMBIEN) 5 MG tablet Take 5 mg by mouth at bedtime as needed for sleep.   Yes [provider]  B-D ULTRAFINE III SHORT PEN 31G X 8 MM MISC 1 each by Other route daily as needed (GLUCOSE TESTING (INSULINE NEEDLE)).  04/22/13   [provider]  ONE TOUCH ULTRA TEST test strip 1 each by Other route as needed for other.  01/15/13   [provider]    Past Medical History: Past Medical History:  Diagnosis Date   AICD (automatic cardioverter/defibrillator) present 02/11/2020   Anemia    Arthritis    Atrial tachycardia (HCC)    Chronic systolic CHF (congestive heart failure) ([place]) 07/04/2018   CKD  (chronic kidney disease), stage III ([place])    Coronary artery disease 02/2012   a. s/p stenting in 2013  b. s/p CABGx2V (SVG--> PDA, SVG--> LCx).   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  Intolerant to PAP therapy   S/P TAVR (transcatheter aortic valve replacement) 05/15/2018   29 mm Edwards Sapien 3 transcatheter heart valve placed via percutaneous right transfemoral approach    Severe aortic stenosis     Past Surgical History: Past Surgical History:  Procedure Laterality Date   BIV ICD INSERTION CRT-D N/A 02/11/2020   Procedure: BIV ICD INSERTION CRT-D;  Surgeon: Thompson Grayer, MD;  Location: [place] CV LAB;  Service: Cardiovascular;  Laterality: N/A;   COLONOSCOPY  07/25/2012   Procedure: COLONOSCOPY;  Surgeon: Winfield Cunas., MD;  Location: [facility] ENDOSCOPY;  Service: Endoscopy;  Laterality: N/A;   COLONOSCOPY N/A 12/02/2013   Procedure: COLONOSCOPY;  Surgeon: Winfield Cunas., MD;  Location: [facility] ENDOSCOPY;  Service: Endoscopy;  Laterality: N/A;   CORONARY ARTERY BYPASS GRAFT N/A 01/04/2013   Procedure: CORONARY ARTERY BYPASS GRAFTING (CABG) times two using right saphenous vein harvested with endoscope.;  Surgeon: Ivin Poot, MD;  Location: [place];  Service: Open Heart Surgery;  Laterality: N/A;   ESOPHAGOGASTRODUODENOSCOPY  07/20/2012   Procedure: ESOPHAGOGASTRODUODENOSCOPY (EGD);  Surgeon: Winfield Cunas., MD;   Location: [facility] ENDOSCOPY;  Service: Endoscopy;  Laterality: N/A;   ESOPHAGOGASTRODUODENOSCOPY N/A 12/02/2013   Procedure: ESOPHAGOGASTRODUODENOSCOPY (EGD);  Surgeon: Winfield Cunas., MD;  Location: [facility] ENDOSCOPY;  Service: Endoscopy;  Laterality: N/A;   HOT HEMOSTASIS N/A 12/02/2013   Procedure: HOT HEMOSTASIS (ARGON PLASMA COAGULATION/BICAP);  Surgeon: Winfield Cunas., MD;  Location: [facility] ENDOSCOPY;  Service: Endoscopy;  Laterality: N/A;   INTRAOPERATIVE TRANSESOPHAGEAL ECHOCARDIOGRAM N/A 01/04/2013   Procedure: INTRAOPERATIVE TRANSESOPHAGEAL ECHOCARDIOGRAM;  Surgeon: Ivin Poot, MD;  Location: [place];  Service: Open Heart Surgery;  Laterality: N/A;   INTRAOPERATIVE TRANSTHORACIC ECHOCARDIOGRAM N/A 05/15/2018   Procedure: INTRAOPERATIVE TRANSTHORACIC ECHOCARDIOGRAM;  Surgeon: Burnell Blanks, MD;  Location: [place];  Service: Open Heart Surgery;  Laterality: N/A;   IR THORACENTESIS ASP PLEURAL SPACE W/IMG GUIDE  10/19/2018   LEFT HEART CATHETERIZATION WITH CORONARY ANGIOGRAM N/A 03/06/2012   Procedure: LEFT HEART CATHETERIZATION WITH CORONARY ANGIOGRAM;  Surgeon: [name], MD;  Location: [place] CATH LAB;  Service: Cardiovascular;  Laterality: N/A;   LITHOTRIPSY     [place] Left 01/04/2013   Procedure: RADIAL ARTERY HARVEST;  Surgeon: Ivin Poot, MD;  Location: [place];  Service: Vascular;  Laterality: Left;  Artery not havested. Unsuitable for use.   resection mediastinal seminonma     RIGHT/LEFT HEART CATH AND CORONARY/GRAFT ANGIOGRAPHY N/A 11/15/2016   Procedure: Right/Left Heart Cath and Coronary/Graft Angiography;  Surgeon: Leonie Man, MD;  Location: [place] CV LAB;  Service: Cardiovascular;  Laterality: N/A;   RIGHT/LEFT HEART CATH AND CORONARY/GRAFT ANGIOGRAPHY N/A 04/13/2018   Procedure: RIGHT/LEFT HEART CATH AND CORONARY/GRAFT ANGIOGRAPHY;  Surgeon: Jolaine Artist, MD;  Location: [place] [facility] LAB;  Service: Cardiovascular;   Laterality: N/A;   TRANSCATHETER AORTIC VALVE REPLACEMENT, TRANSFEMORAL  05/15/2018   TRANSCATHETER AORTIC VALVE REPLACEMENT, TRANSFEMORAL N/A 05/15/2018   Procedure: TRANSCATHETER AORTIC VALVE REPLACEMENT, TRANSFEMORAL;  Surgeon: Burnell Blanks, MD;  Location: [place];  Service: Open Heart Surgery;  Laterality: N/A;    Family History: Reviewed history with patient as documented below Family History  Problem Relation Age of Onset   Diabetes  Father    Diabetes Mother    Heart failure Mother    Hypertension Mother    Cancer Brother    Diabetes Sister     Social History: Social History   Socioeconomic History   Marital status: Married    Spouse name: Not on file   Number of children: Not on file   Years of education: Not on file   Highest education level: Not on file  Occupational History   Occupation: security guard    Comment: [employer]  Tobacco Use   Smoking status: Former    Types: Cigarettes    Quit date: 03/06/1985    Years since quitting: 37.1   Smokeless tobacco: Never   Tobacco comments:    Former smoker 03/23/22  Vaping Use   Vaping Use: Never used  Substance and Sexual Activity   Alcohol use: No   Drug use: No   Sexual activity: Not Currently  Other Topics Concern   Not on file  Social History Narrative   Not on file   Social Determinants of Health   Financial Resource Strain: Low Risk  (09/06/2021)   Overall Financial Resource Strain (CARDIA)    Difficulty of Paying Living Expenses: Not very hard  Recent Concern: Financial Resource Strain - High Risk (08/31/2021)   Overall Financial Resource Strain (CARDIA)    Difficulty of Paying Living Expenses: Hard  Food Insecurity: No Food Insecurity (08/31/2021)   Hunger Vital Sign    Worried About Running Out of Food in the Last Year: Never true    Ran Out of Food in the Last Year: Never true  Transportation Needs: No Transportation Needs (08/31/2021)   PRAPARE - Hydrologist  (Medical): No    Lack of Transportation (Non-Medical): No  Physical Activity: Not on file  Stress: Not on file  Social Connections: Not on file    Allergies:  Allergies  Allergen Reactions   Atorvastatin Other (See Comments)    Muscle pain Other reaction(s): cramps Other reaction(s): cramps   Entresto [Sacubitril-Valsartan] Other (See Comments)    hypotension   Adhesive [Tape] Rash    Objective:    Vital Signs:   Temp:  [97.8 F (36.6 C)] 97.8 F (36.6 C) (09/14 0749) Pulse Rate:  [0-107] 0 (09/14 0921) Resp:  [12-25] 14 (09/14 0906) BP: (74-130)/(44-74) 127/59 (09/14 0911) SpO2:  [79 %-100 %] 97 % (09/14 0906) Weight:  [60.8 kg] 60.8 kg (09/14 0749)   Filed Weights   04/28/22 0749  Weight: 60.8 kg     Physical Exam     General:  Thin, fatigued appearing HEENT: Normal Neck: Supple. Jvp 10-12. Carotids 2+ bilat; no bruits.  Cor: PMI nondisplaced. Regular rate & rhythm. No rubs, gallops or murmurs. Lungs: Clear Abdomen: Soft, nontender, nondistended.  Extremities: No cyanosis, clubbing, rash, edema Neuro: Alert & oriented x 3, cranial nerves grossly intact. moves all 4 extremities w/o difficulty. Affect pleasant.   Telemetry   SR 90s  EKG   SR 97 bpm  Labs     Basic Metabolic Panel: Recent Labs  Lab 04/27/22 1251  NA 137  K 4.4  CL 100  CO2 28  GLUCOSE 91  BUN 32*  CREATININE 1.09  CALCIUM 9.0    Liver Function Tests: No results for input(s): "AST", "ALT", "ALKPHOS", "BILITOT", "PROT", "ALBUMIN" in the last 168 hours. No results for input(s): "LIPASE", "AMYLASE" in the last 168 hours. No results for input(s): "AMMONIA" in the last 168 hours.  CBC: Recent Labs  Lab 04/27/22 1257  WBC 8.6  HGB 11.4*  HCT 35.7*  MCV 72.9*  PLT 457*    Cardiac Enzymes: No results for input(s): "CKTOTAL", "CKMB", "CKMBINDEX", "TROPONINI" in the last 168 hours.  BNP: BNP (last 3 results) Recent Labs    03/08/22 1114 03/30/22 1028 04/27/22 1251   BNP 1,980.1* 1,880.7* 1,466.6*    ProBNP (last 3 results) No results for input(s): "PROBNP" in the last 8760 hours.   CBG: Recent Labs  Lab 04/28/22 0803 04/28/22 0927  GLUCAP 261* 222*    Coagulation Studies: No results for input(s): "LABPROT", "INR" in the last 72 hours.  Imaging: CARDIAC CATHETERIZATION  Result Date: 04/28/2022   Prox LAD to Mid LAD lesion is 40% stenosed.   Ost LAD to Prox LAD lesion is 90% stenosed.   Ost RCA lesion is 100% stenosed.   Ost 1st Diag to 1st Diag lesion is 50% stenosed.   Mid LAD to Dist LAD lesion is 95% stenosed.   Ost Cx to Prox Cx lesion is 100% stenosed.   SVG and is normal in caliber and large.   SVG and is large. Findings: Ao = 102/76 (79) LV = 108/20 RA =  12 RV = 58/10 PA = 61/26 (39) PCW = 20 (v = 27) Fick cardiac output/index = 3.1/1.7 PVR = 6.1 PAPI = 2.9 FA sat = 97% PA sat = 45%, 47% Assessment: 1. 3v CAD with chronically occluded RCA and LCX 2. Patent SVGs to R PDA and OM 3. LAD with 90% proximal lesion and 95% distal 4. Well functioning TAVR 5. Moderate PH 6. Severe low output HF Plan/Discussion: Will admit for HF treatment and w/u for possible VAD. Glori Bickers, MD 9:19 AM    Patient Profile   71 y.o. male with hx of chronic systolic CHF, CAD s/p CABG, PAF, aortic valve stenosis s/p TAVR, CKD III, DM II, OSA. Admitted after heart cath for severe low-output heart failure.  Assessment/Plan   Chronic Systolic Heart Failure: - s/p St Jude CRT-D 01/2020. Unable to place LV lead. Interestingly, he had a RBBB during a recent admission and had LBBB on prior ECGs. Discussed with Dr. Lovena Le, with current RBBB a left bundle lead will not be likely to help.  - Had PYP 2020 not suggestive of amyloid.  - Echo (1/23): EF 20-25%, RV significantly reduced, RVSP 50 mmHg, moderate MR, moderate TR, mean gradient 2 mmHg across aortic valve prosthesis. - CPX (3/23): moderate to severe HF limitation. - R/LHC today: RA 12, PA mean 39, PCWP 20, CI  1.7.  - Initiate milrinone 0.25. for inotrope support. Place PICC for CVP and CO-OX monitoring - Resume Torsemide 40 mg daily tomorrow - Continue Farxiga 10 mg daily.  - Stop beta blocker d/t low-output HF - Continue digoxin 0.125 mg daily. - Continue losartan 25 mg daily (failed Entresto in past due to low BP).  - Continue spironolactone 25 mg daily. - Corlanor previously stopped d/t cost.  - Not a candidate for transplant (age). Would be high risk for VAD. Has history of CABG X 2 and is small in size which may be prohibitive. He is willing to proceed with workup to determine if he would be a candidate for VAD. He has support from his 2 sons who live nearby.   2. CAD - h/o CABG 2013 - LHC today: patent SVGs to R PDA and OM. 90% p and 95% d LAD - No s/s angina. - No ASA with  A/C. - Continue simvastatin 20 mg daily.    3. Persistent AFib - In AFib by device 03/14/22, this is new for him. - CHA2DS2-VASc score is 5 - Switch Xarelto to heparin gtt in anticipation of VAD workup. - He tolerates AFib poorly.  - Back in SR at f/u yesterday - Continue CPAP   4. DM2 - Insulin dependent. - Hgb A1C 8.3 in 01/23, recheck A1c - On SGLT2i. - Managed by PCP.   5. OSA - Continue CPAP.   6. Aortic valve stenosis - S/P TAVR 2019 with 29 mm S3 - Valve okay on  echo 01/23   7. Hypothyroid - On levothyroxine.    8. CKD Stage IIIa - SCr baseline 1.1-1.3 - Scr 1.09 yesterday - Labs today.   FINCH, LINDSAY N, PA-C 04/28/2022, 9:31 AM  Advanced Heart Failure Team Pager [phone] (M-F; 7a - 5p)  Please contact [facility] for night-coverage after hours (4p -7a ) and weekends on [email or web address]    Patient seen and examined with the above-signed Advanced Practice Provider and/or Housestaff. I personally reviewed laboratory data, imaging studies and relevant notes. I independently examined the patient and formulated the important aspects of the plan. I have edited the note to reflect any of my  changes or salient points. I have personally discussed the plan with the patient and/or family.  71 y/o male with chronic systolic HF due to ICM with several weeks progressive HF symptoms. RHC today with low cardiac output  General:  Weak appearing. No resp difficulty HEENT: normal Neck: supple. no JVD. Carotids 2+ bilat; no bruits. No lymphadenopathy or thryomegaly appreciated. Cor: PMI laterally displaced. Regular rate & rhythm. No rubs, gallops or murmurs. Lungs: clear Abdomen: soft, nontender, nondistended. No hepatosplenomegaly. No bruits or masses. Good bowel sounds. Extremities: no cyanosis, clubbing, rash, edema + cool  Neuro: alert & orientedx3, cranial nerves grossly intact. moves all 4 extremities w/o difficulty. Affect pleasant  He has low output HF. Will admit start milrinone. Will need w/u for possible VAD. Has had 2 previous sternotomies so may not be candidate.   Glori Bickers, MD  6:02 PM

## 2022-04-28 NOTE — Progress Notes (Addendum)
Cannot release signed and held orders because another nurse Judson Roch RN is in the chart. Chart is locked.

## 2022-04-28 NOTE — Progress Notes (Signed)
Pt not able to tolerate our CPAP at this time.  RN aware and placing patient on 2 lpm for night rest.

## 2022-04-28 NOTE — Progress Notes (Signed)
ANTICOAGULATION CONSULT NOTE - Initial Consult  Pharmacy Consult for heparin Indication: atrial fibrillation  Allergies  Allergen Reactions   Entresto [Sacubitril-Valsartan] Other (See Comments)    Hypotension    Lipitor [Atorvastatin] Other (See Comments)    Myalgias    Adhesive [Tape] Rash    Patient Measurements: Height: 5\' 10"  (177.8 cm) Weight: 61.6 kg (135 lb 12.9 oz) IBW/kg (Calculated) : 73 Heparin Dosing Weight: 61.6 kg  Vital Signs: Temp: 97.7 F (36.5 C) (09/14 1208) Temp Source: Oral (09/14 1208) BP: 97/66 (09/14 1208) Pulse Rate: 105 (09/14 1208)  Labs: Recent Labs    04/27/22 1251 04/27/22 1257  HGB  --  11.4*  HCT  --  35.7*  PLT  --  457*  CREATININE 1.09  --     Estimated Creatinine Clearance: 53.4 mL/min (by C-G formula based on SCr of 1.09 mg/dL).   Medical History: Past Medical History:  Diagnosis Date   AICD (automatic cardioverter/defibrillator) present 02/11/2020   Anemia    Arthritis    Atrial tachycardia (HCC)    Chronic systolic CHF (congestive heart failure) ([place]) 07/04/2018   CKD (chronic kidney disease), stage III ([place])    Coronary artery disease 02/2012   a. s/p stenting in 2013  b. s/p CABGx2V (SVG--> PDA, SVG--> LCx).   Diabetes mellitus    neuropathy  insulin dependent   Dilated aortic root (HCC)    Dyslipidemia    Erectile dysfunction    GERD (gastroesophageal reflux disease)    History of CVA (cerebrovascular accident)    History of kidney stones    History of radiation therapy 12/03/2018-12/13/2018   left lung  Dr Gery Pray   Hx of radiation therapy to mediastinum 1985   Hypertension    Malignant seminoma of mediastinum ([place]) 1985   OSA (obstructive sleep apnea) 07/04/2018   Severe obstructive sleep apnea with an AHI of 30/h and mild central sleep apnea at 13.7/h with oxygen desaturations as low as 79%.  Intolerant to PAP therapy   S/P TAVR (transcatheter aortic valve replacement) 05/15/2018   29 mm Edwards Sapien  3 transcatheter heart valve placed via percutaneous right transfemoral approach    Severe aortic stenosis     Medications:  Medications Prior to Admission  Medication Sig Dispense Refill Last Dose   acetaminophen (TYLENOL) 500 MG tablet Take 1,500 mg by mouth daily as needed for mild pain or headache.   04/27/2022   amitriptyline (ELAVIL) 25 MG tablet Take 25 mg by mouth at bedtime.    04/27/2022   B Complex-C (SUPER B COMPLEX PO) Take 1 tablet by mouth daily.   Past Week   dapagliflozin propanediol (FARXIGA) 10 MG TABS tablet Take 1 tablet (10 mg total) by mouth daily. 90 tablet 3 Past Week   digoxin (LANOXIN) 0.125 MG tablet Take 1 tablet (0.125 mg total) by mouth daily. 30 tablet 3 Past Week   insulin detemir (LEVEMIR) 100 UNIT/ML injection Inject 10-24 Units into the skin See admin instructions. 20-24 units in the morning, 10 units at bedtime   Past Week   insulin lispro (HUMALOG) 100 UNIT/ML KwikPen Inject 0-4 Units into the skin See admin instructions. Per sliding scale at Breakfast and Dinner - pt could not tell me his sliding scale.   Past Week   levothyroxine (SYNTHROID, LEVOTHROID) 50 MCG tablet Take 50 mcg by mouth daily before breakfast.    Past Week   losartan (COZAAR) 25 MG tablet TAKE 1 TABLET(25 MG) BY MOUTH DAILY (Patient taking  differently: Take 25 mg by mouth daily.) 90 tablet 3 Past Week   metFORMIN (GLUCOPHAGE) 500 MG tablet Take 2 tablets (1,000 mg total) by mouth 2 (two) times daily with a meal.   Past Week   metoprolol succinate (TOPROL-XL) 100 MG 24 hr tablet Take 0.5 tablets (50 mg total) by mouth daily. Take with or immediately following a meal. (Patient taking differently: Take 50 mg by mouth in the morning.)   04/26/2022 at 08:00   potassium chloride SA (KLOR-CON M) 20 MEQ tablet Take 1 tablet (20 mEq total) by mouth daily. 180 tablet 3 Past Week   rivaroxaban (XARELTO) 20 MG TABS tablet Take 1 tablet (20 mg total) by mouth daily with supper. (Patient taking differently:  Take 20 mg by mouth daily.) 28 tablet 0 04/26/2022 at 08:00   spironolactone (ALDACTONE) 25 MG tablet TAKE 1 TABLET(25 MG) BY MOUTH DAILY (Patient taking differently: Take 25 mg by mouth daily.) 30 tablet 3 Past Week   torsemide (DEMADEX) 20 MG tablet Take 2 tablets (40 mg total) by mouth daily. 180 tablet 3 Past Week   zolpidem (AMBIEN) 5 MG tablet Take 5 mg by mouth at bedtime as needed for sleep.   04/27/2022   simvastatin (ZOCOR) 20 MG tablet Take 20 mg by mouth every evening.  (Patient not taking: Reported on 04/28/2022)   Not Taking   Scheduled:   amitriptyline  25 mg Oral QHS   aspirin       dapagliflozin propanediol  10 mg Oral Daily   digoxin  0.0625 mg Oral Daily   insulin aspart  0-15 Units Subcutaneous TID WC   [START ON 04/29/2022] levothyroxine  50 mcg Oral QAC breakfast   losartan  25 mg Oral Daily   simvastatin  20 mg Oral QPM   sodium chloride flush  3 mL Intravenous Q12H   sodium chloride flush  3 mL Intravenous Q12H   spironolactone  25 mg Oral Daily   [START ON 04/29/2022] torsemide  40 mg Oral Daily   Infusions:   sodium chloride     sodium chloride     milrinone 0.25 mcg/kg/min (04/28/22 1106)    Assessment: Pharmacy consulted to dose heparin while PTA Xarelto is on hold. Last Xarelto dose charted as 9/12 PM. Pt is s/p RHC today. Pt is being worked up for VAD. Will continue to follow coag plans. No labs have been drawn thus far.   Goal of Therapy:  Heparin level 0.3-0.7 units/ml aPTT 66-102 seconds Monitor platelets by anticoagulation protocol: Yes   Plan:  Start heparin at 900 units/hr with no bolus aPTT and heparin level in 8 hours Daily aPTT, heparin level, CBC  Thank you for allowing pharmacy to participate in this patient's care.  Reatha Harps, PharmD PGY2 Pharmacy Resident 04/28/2022 3:16 PM Check [email or web address] for unit specific pharmacy number

## 2022-04-28 NOTE — Progress Notes (Signed)
   04/28/22 2035  Assess: MEWS Score  Temp 97.6 F (36.4 C)  BP 98/68  MAP (mmHg) 78  ECG Heart Rate (!) 110  Resp 20  SpO2 96 %  Assess: MEWS Score  MEWS Temp 0  MEWS Systolic 1  MEWS Pulse 1  MEWS RR 0  MEWS LOC 0  MEWS Score 2  MEWS Score Color Yellow  Assess: if the MEWS score is Yellow or Red  Were vital signs taken at a resting state? Yes  Focused Assessment No change from prior assessment  Does the patient meet 2 or more of the SIRS criteria? No  MEWS guidelines implemented *See Row Information* No, previously yellow, continue vital signs every 4 hours  Document  Patient Outcome Stabilized after interventions  Progress note created (see row info) Yes  Assess: SIRS CRITERIA  SIRS Temperature  0  SIRS Pulse 1  SIRS Respirations  0  SIRS WBC 1  SIRS Score Sum  2

## 2022-04-28 NOTE — Progress Notes (Signed)
[facility] EDUCATION NOTE:                VAD evaluation consent reviewed and signed by Darrol Jump. Initial VAD teaching completed with patient.  VAD educational packet including "Understanding Your Options with Advanced Heart Failure", "[place] Patient Agreement for VAD Evaluation and Potential Implantation" consent, and [employer] "Heartmate 3 Left Ventricular Device (LVAD) Patient Guide", Heartmate 3 Left Ventricular Assist System Patient Education Program DVD", "[place] HM III Patient Education", "[place] Mechanical Circulatory Support Program", and "Decision Aids for Left Ventricular Assist Device" reviewed in detail and left at bedside for continued reference.   All questions answered regarding VAD implant, hospital stay, and what to expect when discharged home living with a heart pump. Pt identified one of his son's to be primary caregiver and would have a definitive answer once he talks to them. Explained need for 24/7 care when pt is discharged home due to sternal precautions, adaptation to living on support, emotional support, consistent and meticulous exit site care and management, medication adherence and high volume of follow up visits with the [facility] after discharge; both pt and caregiver verbalized understanding of above.   Explained that LVAD can be implanted for two indications in the setting of advanced left ventricular heart failure treatment:  Bridge to transplant - used for patients who cannot safely wait for heart transplant without this device.  Or    Destination therapy - used for patients until end of life or recovery of heart function.  Patient and caregiver(s) acknowledge that the indication at this point in time for LVAD therapy would be for destination therapy due to age.   Provided brief equipment overview. Will plan to follow up for demonstration of equipment.  Reviewed home inspection check list stressing that only three pronged grounded power outlets can be  used for VAD equipment. Patient confirmed home has electrical outlets that will support the equipment along with access working telephone.  Identified the following lifestyle modifications while living on MCS:    1. No driving for at least three months and then only if doctor gives permission to do so.   2. No tub baths while pump implanted, and shower only when doctor gives permission.   3. No swimming or submersion in water while implanted with pump.   4. No contact sports or engaging in jumping activities.   5. Always have a backup controller, charged spare batteries, and battery clips nearby at all times in case of emergency.   6. Call the doctor or hospital contact person if any change in how the pump sounds, feels, or works.   7. Plan to sleep only when connected to the power module.   8. Do not sleep on your stomach.   9. Keep a backup system controller, charged batteries, battery clips, and flashlight near you during sleep in case of electrical power outage.   10. Exit site care including dressing changes, monitoring for infection, and importance of keeping percutaneous lead stabilized at all times.     Will extend the option to have one of our current patients and caregiver(s) come to talk with patient about living on support to assist with decision making next week.  Reinforced need for 24 hour/7 day week caregivers; pt designated therapy. Pt will also need to abide by sternal precautions with no lifting >10lbs, pushing, pulling and will need assistance with adapting to new life style with VAD equipment and care.   Intermacs patient survival statistics through  March 2023 reviewed with patient and caregiver as follows:                                             Patient understands that he may not be a candidate at [facility]. VAD Coordinator will refer patient to [facility] for further evaluation.The patient understands that from this discussion it does not mean that they will receive the  device, but that depends on an extensive evaluation process. The patient is aware of the fact that if at anytime they want to stop the evaluation process they can.  All questions have been answered at this time and contact information was provided should they encounter any further questions.  Patient is agreeable at this time to the evaluation process and will move forward.    Bobbye Morton RN, BSN VAD Coordinator   Office: [phone] 24/7 [place] Pager: [phone]

## 2022-04-28 NOTE — Progress Notes (Addendum)
LOCATION: right RADIAL  DEFLATED PER PROTOCOL: YES  TIME BAND OFF/DRESSING APPLIED: 1142, gauze with tegaderm   SITE UPON ARRIVAL: LEVEL 0  SITE AFTER BAND REMOVAL: LEVEL 0  CIRCULATION SENSATION AND MOVEMENT: + movement , Radial pulse at +2 bilaterally  COMMENTS:

## 2022-04-28 NOTE — Telephone Encounter (Signed)
Transmission received 04/27/2022.

## 2022-04-29 ENCOUNTER — Inpatient Hospital Stay (HOSPITAL_COMMUNITY): Payer: Medicare Other

## 2022-04-29 ENCOUNTER — Encounter (HOSPITAL_COMMUNITY): Payer: Medicare Other

## 2022-04-29 DIAGNOSIS — Z01810 Encounter for preprocedural cardiovascular examination: Secondary | ICD-10-CM | POA: Diagnosis not present

## 2022-04-29 DIAGNOSIS — I255 Ischemic cardiomyopathy: Secondary | ICD-10-CM | POA: Diagnosis not present

## 2022-04-29 LAB — HEPATITIS C ANTIBODY: HCV Ab: NONREACTIVE

## 2022-04-29 LAB — APTT
aPTT: >200 seconds (ref 24–36)
aPTT: 34 seconds (ref 24–36)
aPTT: 38 seconds — ABNORMAL HIGH (ref 24–36)
aPTT: 37 seconds — ABNORMAL HIGH (ref 24–36)

## 2022-04-29 LAB — BASIC METABOLIC PANEL
Anion gap: 13 (ref 5–15)
BUN: 26 mg/dL — ABNORMAL HIGH (ref 8–23)
CO2: 26 mmol/L (ref 22–32)
Calcium: 8.6 mg/dL — ABNORMAL LOW (ref 8.9–10.3)
Chloride: 97 mmol/L — ABNORMAL LOW (ref 98–111)
Creatinine, Ser: 0.94 mg/dL (ref 0.61–1.24)
GFR, Estimated: >60 mL/min (ref >60)
Glucose, Bld: 238 mg/dL — ABNORMAL HIGH (ref 70–99)
Potassium: 3.8 mmol/L (ref 3.5–5.1)
Sodium: 136 mmol/L (ref 135–145)

## 2022-04-29 LAB — PULMONARY FUNCTION TEST
DL/VA % pred: 100 %
DL/VA: 4.01 ml/min/mmHg/L
DLCO cor % pred: 61 %
DLCO cor: 15.85 ml/min/mmHg
DLCO unc % pred: 55 %
DLCO unc: 14.21 ml/min/mmHg
FEF 25-75 Pre: 2.5 L/sec
FEF2575-%Pred-Pre: 105 %
FEV1-%Pred-Pre: 76 %
FEV1-Pre: 2.43 L
FEV1FVC-%Pred-Pre: 113 %
FEV6-%Pred-Pre: 70 %
FEV6-Pre: 2.89 L
FEV6FVC-%Pred-Pre: 106 %
FVC-%Pred-Pre: 67 %
FVC-Pre: 2.93 L
Pre FEV1/FVC ratio: 83 %
Pre FEV6/FVC Ratio: 100 %
RV % pred: 70 %
RV: 1.76 L
TLC % pred: 68 %
TLC: 4.82 L

## 2022-04-29 LAB — TYPE AND SCREEN
ABO/RH(D): O POS
Antibody Screen: NEGATIVE

## 2022-04-29 LAB — GLUCOSE, CAPILLARY
Glucose-Capillary: 234 mg/dL — ABNORMAL HIGH (ref 70–99)
Glucose-Capillary: 193 mg/dL — ABNORMAL HIGH (ref 70–99)
Glucose-Capillary: 355 mg/dL — ABNORMAL HIGH (ref 70–99)
Glucose-Capillary: 140 mg/dL — ABNORMAL HIGH (ref 70–99)

## 2022-04-29 LAB — PREALBUMIN: Prealbumin: 17 mg/dL — ABNORMAL LOW (ref 18–38)

## 2022-04-29 LAB — COOXEMETRY PANEL
Carboxyhemoglobin: 3.5 % — ABNORMAL HIGH (ref 0.5–1.5)
Methemoglobin: 2.6 % — ABNORMAL HIGH (ref 0.0–1.5)
O2 Saturation: 68.8 %
Total hemoglobin: 10.9 g/dL — ABNORMAL LOW (ref 12.0–16.0)

## 2022-04-29 LAB — CBC
HCT: 33.7 % — ABNORMAL LOW (ref 39.0–52.0)
Hemoglobin: 11 g/dL — ABNORMAL LOW (ref 13.0–17.0)
MCH: 23.2 pg — ABNORMAL LOW (ref 26.0–34.0)
MCHC: 32.6 g/dL (ref 30.0–36.0)
MCV: 71.1 fL — ABNORMAL LOW (ref 80.0–100.0)
Platelets: 382 10*3/uL (ref 150–400)
RBC: 4.74 MIL/uL (ref 4.22–5.81)
RDW: 17.8 % — ABNORMAL HIGH (ref 11.5–15.5)
WBC: 7.1 10*3/uL (ref 4.0–10.5)
nRBC: 0 % (ref 0.0–0.2)

## 2022-04-29 LAB — HIV ANTIBODY (ROUTINE TESTING W REFLEX): HIV Screen 4th Generation wRfx: NONREACTIVE

## 2022-04-29 LAB — HEMOGLOBIN A1C
Hgb A1c MFr Bld: 9.6 % — ABNORMAL HIGH (ref 4.8–5.6)
Mean Plasma Glucose: 228.82 mg/dL

## 2022-04-29 LAB — HEPATITIS B CORE ANTIBODY, TOTAL: Hep B Core Total Ab: NONREACTIVE

## 2022-04-29 LAB — HEPARIN LEVEL (UNFRACTIONATED)
Heparin Unfractionated: >1.1 IU/mL — ABNORMAL HIGH (ref 0.30–0.70)
Heparin Unfractionated: 0.49 IU/mL (ref 0.30–0.70)

## 2022-04-29 LAB — ANTITHROMBIN III: AntiThromb III Func: 124 % — ABNORMAL HIGH (ref 75–120)

## 2022-04-29 LAB — LIPID PANEL
Cholesterol: 119 mg/dL (ref 0–200)
HDL: 39 mg/dL — ABNORMAL LOW (ref >40)
LDL Cholesterol: 65 mg/dL (ref 0–99)
Total CHOL/HDL Ratio: 3.1 RATIO
Triglycerides: 76 mg/dL (ref <150)
VLDL: 15 mg/dL (ref 0–40)

## 2022-04-29 LAB — HEPATITIS B SURFACE ANTIGEN: Hepatitis B Surface Ag: NONREACTIVE

## 2022-04-29 LAB — LACTATE DEHYDROGENASE: LDH: 150 U/L (ref 98–192)

## 2022-04-29 LAB — T4, FREE: Free T4: 1.08 ng/dL (ref 0.61–1.12)

## 2022-04-29 LAB — TSH: TSH: 4.1 u[IU]/mL (ref 0.350–4.500)

## 2022-04-29 LAB — URIC ACID: Uric Acid, Serum: 7.7 mg/dL (ref 3.7–8.6)

## 2022-04-29 MED ORDER — ENSURE ENLIVE PO LIQD
237.0000 mL | Freq: Three times a day (TID) | ORAL | Status: DC
  Administered 2022-04-30 – 2022-05-02 (×8): 237 mL via ORAL

## 2022-04-29 MED ORDER — ADULT MULTIVITAMIN W/MINERALS CH
1.0000 | ORAL_TABLET | Freq: Every day | ORAL | Status: DC
  Administered 2022-04-29 – 2022-05-09 (×11): 1 via ORAL
  Filled 2022-04-29 (×11): qty 1

## 2022-04-29 MED ORDER — MAGNESIUM SULFATE 2 GM/50ML IV SOLN
2.0000 g | Freq: Once | INTRAVENOUS | Status: AC
  Administered 2022-04-29: 2 g via INTRAVENOUS
  Filled 2022-04-29: qty 50

## 2022-04-29 MED ORDER — IOHEXOL 350 MG/ML SOLN
75.0000 mL | Freq: Once | INTRAVENOUS | Status: AC | PRN
  Administered 2022-04-29: 75 mL via INTRAVENOUS

## 2022-04-29 MED ORDER — INSULIN DETEMIR 100 UNIT/ML ~~LOC~~ SOLN
10.0000 [IU] | Freq: Every day | SUBCUTANEOUS | Status: DC
  Administered 2022-04-29 – 2022-05-01 (×3): 10 [IU] via SUBCUTANEOUS
  Filled 2022-04-29 (×4): qty 0.1

## 2022-04-29 MED ORDER — LOSARTAN POTASSIUM 25 MG PO TABS
12.5000 mg | ORAL_TABLET | Freq: Every day | ORAL | Status: DC
  Administered 2022-04-29 – 2022-05-03 (×5): 12.5 mg via ORAL
  Filled 2022-04-29 (×5): qty 1

## 2022-04-29 MED ORDER — PROSOURCE PLUS PO LIQD
30.0000 mL | Freq: Two times a day (BID) | ORAL | Status: DC
  Administered 2022-04-30 – 2022-05-09 (×20): 30 mL via ORAL
  Filled 2022-04-29 (×17): qty 30

## 2022-04-29 MED ORDER — TRAMADOL HCL 50 MG PO TABS
50.0000 mg | ORAL_TABLET | Freq: Four times a day (QID) | ORAL | Status: DC | PRN
  Administered 2022-04-29 – 2022-05-05 (×18): 50 mg via ORAL
  Filled 2022-04-29 (×18): qty 1

## 2022-04-29 NOTE — Consult Note (Signed)
[name] [address]            [name], [id number]          [phone]       [name] [name] Medical Record #[id number] Date of Birth: 1950-11-27  No ref. provider found Dr. Clare Charon, [place], MD  Chief Complaint:   Fatigue, anorexia, weight loss, dyspnea on exertion   History of Present Illness:     Patient examined, images of most recent coronary angiogram and 2D echocardiogram and CT scan of chest personally reviewed.  Recent right heart cath data reviewed demonstrating low cardiac output with elevated wedge pressure reviewed.  In 2014 the patient had a presentation with non-STEMI and mild-moderate LV dysfunction and underwent CABG with vein graft to the PDA and vein graft to the OM and did well.  His LAD did not have significant disease.  On most recent cardiac catheterization he has a proximal LAD stenosis with patent vein graft to the PDA and OM.  His EF now is less than 20% and cardiac index 1.7.  His Co. ox was 48% and a PICC line was placed for IV milrinone.  Patient's echocardiogram shows severe LV dysfunction and moderate to severe RV dysfunction with moderate TR.  A TAVR prosthesis placed in 2019 for low gradient severe AS is functioning well.  Patient's renal function has continued to maintain with creatinine 1.1.  He is a diabetic with hemoglobin A1c of 9.8, his albumin now is 2.6 and prealbumin is pending.  Patient is being evaluated for possible destination therapy VAD support.  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  He had a redo sternotomy for his CABG in 2014.  2020 he was found to have a 3 cm mass in the superior segment of left lower lobe which was biopsy-proven to be squamous cell carcinoma.  He had some mild mediastinal adenopathy without significant activity on PET scan.  He was not felt to be  candidate for lobectomy due to his severe cardiac disease and underwent SBRT.  Patient currently does not smoke.  Despite his weight loss, low cardiac output, and poorly controlled diabetes still works full-time at [employer].  The patient's wife is a retired Marine scientist however he is in the process of a divorce.  He was recently evaluated for baro-stimulation to improve functional status however that is now on hold.  He has had an AICD placed that is never discharged.  He does not have significant LBBB.  I have compared the post radiation CT of his chest from 2022 to April 2023 which shows increase in soft tissue density in the area of the superior segment left lower lobe adjacent to the mediastinum.  A CT scan of the chest at this admission is pending.  The patient is at very high risk of recurrence of his lung cancer with a large [greater than 3 cm ]primary tumor treated with radiation only.  If the pending CT scan shows left pleural effusion then thoracentesis for cytology should be considered.  Current Activity/ Functional Status: Zubrod Score: At the time of surgery this patient's most appropriate activity status/level should be described as: []     0    Normal activity, no symptoms []     1    Restricted in physical  strenuous activity but ambulatory, able to do out light work []     2    Ambulatory and capable of self care, unable to do work activities, up and about >50 % of waking hours                              [x]     3    Only limited self care, in bed greater than 50% of waking hours []     4    Completely disabled, no self care, confined to bed or chair []     5    Moribund    Past Medical History:  Diagnosis Date   AICD (automatic cardioverter/defibrillator) present 02/11/2020   Anemia    Arthritis    Atrial tachycardia (HCC)    Chronic systolic CHF (congestive heart failure) ([place]) 07/04/2018   CKD (chronic kidney disease), stage III ([place])    Coronary artery disease 02/2012   a. s/p  stenting in 2013  b. s/p CABGx2V (SVG--> PDA, SVG--> LCx).   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  Intolerant to PAP therapy   S/P TAVR (transcatheter aortic valve replacement) 05/15/2018   29 mm Edwards Sapien 3 transcatheter heart valve placed via percutaneous right transfemoral approach    Severe aortic stenosis     Past Surgical History:  Procedure Laterality Date   BIV ICD INSERTION CRT-D N/A 02/11/2020   Procedure: BIV ICD INSERTION CRT-D;  Surgeon: Thompson Grayer, MD;  Location: [place] [facility];  Service: Cardiovascular;  Laterality: N/A;   COLONOSCOPY  07/25/2012   Procedure: COLONOSCOPY;  Surgeon: Winfield Cunas., MD;  Location: [facility]OSCOPY;  Service: Endoscopy;  Laterality: N/A;   COLONOSCOPY N/A 12/02/2013   Procedure: COLONOSCOPY;  Surgeon: Winfield Cunas., MD;  Location: [facility] ENDOSCOPY;  Service: Endoscopy;  Laterality: N/A;   CORONARY ARTERY BYPASS GRAFT N/A 01/04/2013   Procedure: CORONARY ARTERY BYPASS GRAFTING (CABG) times two using right saphenous vein harvested with endoscope.;  Surgeon: Ivin Poot, MD;  Location: [place];  Service: Open Heart Surgery;  Laterality: N/A;   ESOPHAGOGASTRODUODENOSCOPY  07/20/2012   Procedure: ESOPHAGOGASTRODUODENOSCOPY (EGD);  Surgeon: Winfield Cunas., MD;  Location: [facility] ENDOSCOPY;  Service: Endoscopy;  Laterality: N/A;   ESOPHAGOGASTRODUODENOSCOPY N/A 12/02/2013    Procedure: ESOPHAGOGASTRODUODENOSCOPY (EGD);  Surgeon: Winfield Cunas., MD;  Location: [facility];  Service: Endoscopy;  Laterality: N/A;   HOT HEMOSTASIS N/A 12/02/2013   Procedure: HOT HEMOSTASIS (ARGON PLASMA COAGULATION/BICAP);  Surgeon: Winfield Cunas., MD;  Location: [facility] ENDOSCOPY;  Service: Endoscopy;  Laterality: N/A;   INTRAOPERATIVE TRANSESOPHAGEAL ECHOCARDIOGRAM N/A 01/04/2013   Procedure: INTRAOPERATIVE TRANSESOPHAGEAL ECHOCARDIOGRAM;  Surgeon: Ivin Poot, MD;  Location: [place];  Service: Open Heart Surgery;  Laterality: N/A;   INTRAOPERATIVE TRANSTHORACIC ECHOCARDIOGRAM N/A 05/15/2018  Procedure: INTRAOPERATIVE TRANSTHORACIC ECHOCARDIOGRAM;  Surgeon: Burnell Blanks, MD;  Location: [place];  Service: Open Heart Surgery;  Laterality: N/A;   IR THORACENTESIS ASP PLEURAL SPACE W/IMG GUIDE  10/19/2018   LEFT HEART CATHETERIZATION WITH CORONARY ANGIOGRAM N/A 03/06/2012   Procedure: LEFT HEART CATHETERIZATION WITH CORONARY ANGIOGRAM;  Surgeon: [name], MD;  Location: [place] CATH LAB;  Service: Cardiovascular;  Laterality: N/A;   LITHOTRIPSY     Gonzalez Left 01/04/2013   Procedure: RADIAL ARTERY HARVEST;  Surgeon: Ivin Poot, MD;  Location: [place];  Service: Vascular;  Laterality: Left;  Artery not havested. Unsuitable for use.   resection mediastinal seminonma     RIGHT/LEFT HEART CATH AND CORONARY/GRAFT ANGIOGRAPHY N/A 11/15/2016   Procedure: Right/Left Heart Cath and Coronary/Graft Angiography;  Surgeon: Leonie Man, MD;  Location: [place] CV [facility];  Service: Cardiovascular;  Laterality: N/A;   RIGHT/LEFT HEART CATH AND CORONARY/GRAFT ANGIOGRAPHY N/A 04/13/2018   Procedure: RIGHT/LEFT HEART CATH AND CORONARY/GRAFT ANGIOGRAPHY;  Surgeon: Jolaine Artist, MD;  Location: [place] [facility];  Service: Cardiovascular;  Laterality: N/A;   RIGHT/LEFT HEART CATH AND CORONARY/GRAFT ANGIOGRAPHY N/A 04/28/2022   Procedure: RIGHT/LEFT  HEART CATH AND CORONARY/GRAFT ANGIOGRAPHY;  Surgeon: Jolaine Artist, MD;  Location: [place] [facility];  Service: Cardiovascular;  Laterality: N/A;   TRANSCATHETER AORTIC VALVE REPLACEMENT, TRANSFEMORAL  05/15/2018   TRANSCATHETER AORTIC VALVE REPLACEMENT, TRANSFEMORAL N/A 05/15/2018   Procedure: TRANSCATHETER AORTIC VALVE REPLACEMENT, TRANSFEMORAL;  Surgeon: Burnell Blanks, MD;  Location: [place];  Service: Open Heart Surgery;  Laterality: N/A;    Social History   Tobacco Use  Smoking Status Former   Types: Cigarettes   Quit date: 03/06/1985   Years since quitting: 37.1  Smokeless Tobacco Never  Tobacco Comments   Former smoker 03/23/22    Social History   Substance and Sexual Activity  Alcohol Use No    Social History   Socioeconomic History   Marital status: Married    Spouse name: Not on file   Number of children: Not on file   Years of education: Not on file   Highest education level: Not on file  Occupational History   Occupation: security guard    Comment: [employer]  Tobacco Use   Smoking status: Former    Types: Cigarettes    Quit date: 03/06/1985    Years since quitting: 37.1   Smokeless tobacco: Never   Tobacco comments:    Former smoker 03/23/22  Vaping Use   Vaping Use: Never used  Substance and Sexual Activity   Alcohol use: No   Drug use: No   Sexual activity: Not Currently  Other Topics Concern   Not on file  Social History Narrative   Not on file   Social Determinants of Health   Financial Resource Strain: Low Risk  (09/06/2021)   Overall Financial Resource Strain (CARDIA)    Difficulty of Paying Living Expenses: Not very hard  Recent Concern: Financial Resource Strain - High Risk (08/31/2021)   Overall Financial Resource Strain (CARDIA)    Difficulty of Paying Living Expenses: Hard  Food Insecurity: No Food Insecurity (08/31/2021)   Hunger Vital Sign    Worried About Running Out of Food in the Last Year: Never true    Ran Out of Food  in the Last Year: Never true  Transportation Needs: No Transportation Needs (08/31/2021)   PRAPARE - Hydrologist (Medical):  No    Lack of Transportation (Non-Medical): No  Physical Activity: Not on file  Stress: Not on file  Social Connections: Not on file  Intimate Partner Violence: Not on file    Allergies  Allergen Reactions   Entresto [Sacubitril-Valsartan] Other (See Comments)    Hypotension    Lipitor [Atorvastatin] Other (See Comments)    Myalgias    Adhesive [Tape] Rash    Current Facility-Administered Medications  Medication Dose Route Frequency Provider Last Rate Last Admin   0.9 %  sodium chloride infusion  250 mL Intravenous PRN Bensimhon, Shaune Pascal, MD       0.9 %  sodium chloride infusion  250 mL Intravenous PRN Joette Catching, PA-C       acetaminophen (TYLENOL) tablet 650 mg  650 mg Oral Q4H PRN Joette Catching, PA-C   650 mg at 04/28/22 1707   amitriptyline (ELAVIL) tablet 25 mg  25 mg Oral QHS Joette Catching, [place]   25 mg at 04/28/22 2135   Chlorhexidine Gluconate Cloth 2 % PADS 6 each  6 each Topical Daily Bensimhon, Shaune Pascal, MD   6 each at 04/28/22 1842   dapagliflozin propanediol (FARXIGA) tablet 10 mg  10 mg Oral Daily Joette Catching, PA-C   10 mg at 04/29/22 0900   digoxin (LANOXIN) tablet 0.0625 mg  0.0625 mg Oral Daily Joette Catching, PA-C   0.0625 mg at 04/29/22 0900   heparin ADULT infusion 100 units/mL (25000 units/274mL)  1,100 Units/hr Intravenous Continuous Einar Grad, RPH 11 mL/hr at 04/29/22 0316 1,100 Units/hr at 04/29/22 0316   insulin aspart (novoLOG) injection 0-15 Units  0-15 Units Subcutaneous TID WC Joette Catching, [place]   5 Units at 04/29/22 7026   levothyroxine (SYNTHROID) tablet 50 mcg  50 mcg Oral QAC breakfast Joette Catching, PA-C   50 mcg at 04/29/22 0659   milrinone (PRIMACOR) 20 MG/100 ML (0.2 mg/mL) infusion  0.25 mcg/kg/min Intravenous Continuous Joette Catching, PA-C 4.56 mL/hr at 04/28/22 1937 0.25 mcg/kg/min at 04/28/22 1937   ondansetron (ZOFRAN) injection 4 mg  4 mg Intravenous Q6H PRN Joette Catching, PA-C       simvastatin (ZOCOR) tablet 20 mg  20 mg Oral QPM Joette Catching, PA-C   20 mg at 04/28/22 1816   sodium chloride flush (NS) 0.9 % injection 10-40 mL  10-40 mL Intracatheter Q12H Bensimhon, Shaune Pascal, MD   10 mL at 04/29/22 0901   sodium chloride flush (NS) 0.9 % injection 10-40 mL  10-40 mL Intracatheter PRN Bensimhon, Shaune Pascal, MD       sodium chloride flush (NS) 0.9 % injection 3 mL  3 mL Intravenous Q12H Bensimhon, Shaune Pascal, MD   3 mL at 04/29/22 0903   sodium chloride flush (NS) 0.9 % injection 3 mL  3 mL Intravenous PRN Bensimhon, Shaune Pascal, MD       sodium chloride flush (NS) 0.9 % injection 3 mL  3 mL Intravenous Q12H Joette Catching, PA-C   3 mL at 04/29/22 0902   sodium chloride flush (NS) 0.9 % injection 3 mL  3 mL Intravenous PRN Joette Catching, PA-C       spironolactone (ALDACTONE) tablet 25 mg  25 mg Oral Daily Joette Catching, PA-C   25 mg at 04/29/22 0900   torsemide (DEMADEX) tablet 40 mg  40 mg Oral Daily Joette Catching, PA-C   40 mg at 04/29/22 0900   zolpidem (  AMBIEN) tablet 5 mg  5 mg Oral QHS PRN Joette Catching, PA-C   5 mg at 04/28/22 2135     Family History  Problem Relation Age of Onset   Diabetes Father    Diabetes Mother    Heart failure Mother    Hypertension Mother    Cancer Brother    Diabetes Sister      Review of Systems:     Cardiac Review of Systems: Y or N  Chest Pain [    ]  Resting SOB [   ] Exertional SOB  [ y ]  Orthopnea [  ]   Pedal Edema [   ]    Palpitations Blue.Reese  ] Syncope  [  ]   Presyncope [   ]  General Review of Systems: [Y] = yes [  ]=no Constitional: recent weight change [ y ]; anorexia Blue.Reese  ]; fatigue Blue.Reese  ]; nausea [  ]; night sweats [  ]; fever [  ]; or chills [  ];                                                                                                                                           Dental: poor dentition[  ]; Last Dentist visit: No dental complaints  Eye : blurred vision [  ]; diplopia [   ]; vision changes [  ];  Amaurosis fugax[  ]; Resp: cough [  ];  wheezing[  ];  hemoptysis[  ]; shortness of breath[ y ]; paroxysmal nocturnal dyspnea[  ]; dyspnea on exertion[ yy ]; or orthopnea[  ];  GI:  gallstones[  ], vomiting[  ];  dysphagia[  ]; melena[  ];  hematochezia [  ]; heartburn[  ];   Hx of  Colonoscopy[  ]; GU: kidney stones [  ]; hematuria[  ];   dysuria [  ];  nocturia[  ];  history of     obstruction [  ];                 Skin: rash, swelling[  ];, hair loss[  ];  peripheral edema[  ];  or itching[  ]; Musculosketetal: myalgias[  ];  joint swelling[  ];  joint erythema[  ];  joint pain[  ];  back pain[  ];  Heme/Lymph: bruising[  ];  bleeding[  ];  anemia[  ];  Neuro: TIA[  ];  headaches[  ];  stroke[ y ];  vertigo[  ];  seizures[  ];   paresthesias[  ];  difficulty walking[  ];  Psych:depression[  ]; anxiety[  ];  Endocrine: diabetes[ y ];  thyroid dysfunction[  ];  Immunizations: Flu [  ]; Pneumococcal[  ];  Other: Right-hand-dominant  Physical Exam: BP 110/77   Pulse (!) 121   Temp 98 F (36.7 C) (Oral)   Resp (!) 25  Ht 5\' 10"  (1.778 m)   Wt 60 kg   SpO2 99%   BMI 18.98 kg/m       Physical Exam  General: Thin, cachectic chronically ill appearing middle-aged AA male no acute distress HEENT: Normocephalic pupils equal , dentition adequate Neck: Supple with moderate  JVD, no palpable adenopathy, no bruit Chest: Clear to auscultation, symmetrical breath sounds, no rhonchi, no tenderness             or deformity.  Sternal incision with prominence due to lack of soft tissue over his ribs. Cardiovascular: Regular rate and rhythm, soft systolic flow murmur, no gallop, peripheral pulses            1-2+ palpable in all extremities Abdomen:  Soft, scaphoid nontender, no palpable  mass or organomegaly Extremities: Warm, well-perfused, no clubbing cyanosis edema or tenderness,              no venous stasis changes of the legs Rectal/GU: Deferred Neuro: Grossly non--focal and symmetrical throughout Skin: Clean and dry without rash or ulceration    Diagnostic Studies & Laboratory data:     Recent Radiology Findings:   [place] EKG SITE RITE  Result Date: 04/28/2022 If Site Rite image not attached, placement could not be confirmed due to current cardiac rhythm.  CARDIAC CATHETERIZATION  Result Date: 04/28/2022   Prox LAD to Mid LAD lesion is 40% stenosed.   Ost LAD to Prox LAD lesion is 90% stenosed.   Ost RCA lesion is 100% stenosed.   Ost 1st Diag to 1st Diag lesion is 50% stenosed.   Mid LAD to Dist LAD lesion is 95% stenosed.   Ost Cx to Prox Cx lesion is 100% stenosed.   SVG and is normal in caliber and large.   SVG and is large. Findings: Ao = 102/76 (79) LV = 108/20 RA =  12 RV = 58/10 PA = 61/26 (39) PCW = 20 (v = 27) Fick cardiac output/index = 3.1/1.7 PVR = 6.1 PAPI = 2.9 FA sat = 97% PA sat = 45%, 47% Assessment: 1. 3v CAD with chronically occluded RCA and LCX 2. Patent SVGs to R PDA and OM 3. LAD with 90% proximal lesion and 95% distal 4. Well functioning TAVR 5. Moderate PH 6. Severe low output HF Plan/Discussion: Will admit for HF treatment and w/u for possible VAD. Glori Bickers, MD 9:19 AM     Recent Lab Findings: Lab Results  Component Value Date   WBC 7.1 04/29/2022   HGB 11.0 (L) 04/29/2022   HCT 33.7 (L) 04/29/2022   PLT 382 04/29/2022   GLUCOSE 438 (H) 04/28/2022   CHOL 119 04/29/2022   TRIG 76 04/29/2022   HDL 39 (L) 04/29/2022   LDLCALC 65 04/29/2022   ALT 10 04/28/2022   AST 14 (L) 04/28/2022   NA 136 04/28/2022   K 4.3 04/28/2022   CL 104 04/28/2022   CREATININE 1.01 04/28/2022   BUN 27 (H) 04/28/2022   CO2 22 04/28/2022   TSH 4.100 04/29/2022   INR 1.0 11/14/2018   HGBA1C 9.6 (H) 04/29/2022      Assessment / Plan:       Ischemic cardiomyopathy with severe LV dysfunction EF less than 20% with low cardiac output on presentation and 2D echo showing moderate to severe RV dysfunction and moderate TR.  2 previous sternotomies, the first being for resection of a germ cell tumor followed with high-dose mediastinal radiation.  Recent non-curative high-dose radiation therapy for 3 cm  squamous cell carcinoma of the superior segment of the left lower lobe with surveillance scans suspicious for recurrence  Severe weight loss, protein malnutrition, and decreased functional status.   We will proceed with full evaluation and VAD team discussion of potential benefits/risks of advanced therapies in this patient.

## 2022-04-29 NOTE — Evaluation (Signed)
Physical Therapy Evaluation Patient Details Name: [name] MRN: [id number] DOB: 02-28-1951 Today's Date: 04/29/2022  History of Present Illness  Patient is a 71 yo male admitted on 04/28/22 with shortness of breath, CHF exacerbation. Mult admissions this year for same.  Pt works as Forensic psychologist for [employer].   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  Clinical Impression  Pt was seen for initial evaluation and note his endurance is low, demonstrating most limiting factor with all movement x HR.  Resting HR was 121, with high of 125 to walk.  Upon sitting and talking with PT, had brief pop up to 150 on HR, then back to 113-122.  Follow to monitor HR and see how pt progresses with independent walking.  He is motivated to return to work but will need MD guidelines since his job is fairly exerting.  PT acute goals are outlined below.       Recommendations for follow up therapy are one component of a multi-disciplinary discharge planning process, led by the attending physician.  Recommendations may be updated based on patient status, additional functional criteria and insurance authorization.  Follow Up Recommendations No PT follow up      Assistance Recommended at Discharge PRN  Patient can return home with the following  Assistance with cooking/housework;Assist for transportation    Equipment Recommendations None recommended by PT  Recommendations for Other Services       Functional Status Assessment Patient has had a recent decline in their functional status and demonstrates the ability to make significant improvements in function in a reasonable and predictable amount of time.     Precautions / Restrictions Precautions Precautions: Fall Precaution  Comments: monitor HR Restrictions Weight Bearing Restrictions: No      Mobility  Bed Mobility Overal bed mobility: Modified Independent                  Transfers Overall transfer level: Modified independent                 General transfer comment: requires help with mult lines    Ambulation/Gait Ambulation/Gait assistance: Min guard Gait Distance (Feet): 50 Feet (25 x 2) Assistive device: 1 person hand held assist Gait Pattern/deviations: Step-through pattern, Decreased stride length Gait velocity: reduced Gait velocity interpretation: <1.31 ft/sec, indicative of household ambulator Pre-gait activities: standing balance ck General Gait Details: slow pace with pt touching but not supporting on IV pole  Stairs            Wheelchair Mobility    Modified Rankin (Stroke Patients Only)       Balance Overall balance assessment: Needs assistance Sitting-balance support: Feet supported Sitting balance-Leahy Scale: Fair     Standing balance support: Single extremity supported Standing balance-Leahy Scale: Fair                               Pertinent Vitals/Pain Pain Assessment Pain Assessment: Faces Faces Pain Scale: Hurts little more Pain Location: back and shoulder pain Pain Descriptors / Indicators: Aching, Guarding Pain Intervention(s): Limited activity within patient's tolerance, Monitored during session, Repositioned, Patient requesting pain meds-RN notified    Home Living Family/patient expects to be discharged to:: Private residence Living Arrangements:  Alone Available Help at Discharge: Family;Available PRN/intermittently Type of Home: House Home Access: Level entry       Home Layout: One level Home Equipment: Conservation officer, nature (2 wheels) Additional Comments: Patient living alone and still driving and working at [employer]    Prior Function Prior Level of Function : Independent/Modified Independent;Working/employed;Driving              Mobility Comments: drives and works, unfortunately going through a divorce       Journalist, newspaper   Dominant Hand: Right    Extremity/Trunk Assessment   Upper Extremity Assessment Upper Extremity Assessment: Overall WFL for tasks assessed    Lower Extremity Assessment Lower Extremity Assessment: Generalized weakness    Cervical / Trunk Assessment Cervical / Trunk Assessment: Kyphotic  Communication   Communication: No difficulties  Cognition Arousal/Alertness: Awake/alert Behavior During Therapy: Flat affect Overall Cognitive Status: Within Functional Limits for tasks assessed                                 General Comments: pt is clearly weaker than usual        General Comments General comments (skin integrity, edema, etc.): pt is leaing against elevated HOB when PT arrives, feeling tired and describes poor sleep    Exercises     Assessment/Plan    PT Assessment Patient needs continued PT services  PT Problem List Decreased activity tolerance;Decreased balance;Decreased knowledge of use of DME;Cardiopulmonary status limiting activity       PT Treatment Interventions DME instruction;Gait training;Functional mobility training;Therapeutic activities;Therapeutic exercise;Balance training;Neuromuscular re-education;Patient/family education    PT Goals (Current goals can be found in the Care Plan section)  Acute Rehab PT Goals Patient Stated Goal: to get back to work PT Goal Formulation: With patient Time For Goal Achievement: 05/13/22 Potential to Achieve Goals: Good    Frequency Min 3X/week     Co-evaluation               AM-PAC PT "6 Clicks" Mobility  Outcome Measure Help needed turning from your back to your side while in a flat bed without using bedrails?: None Help needed moving from lying on your back to sitting on the side of a flat bed without using bedrails?: None Help needed moving to and from a bed to a chair  (including a wheelchair)?: A Little Help needed standing up from a chair using your arms (e.g., wheelchair or bedside chair)?: A Little Help needed to walk in hospital room?: A Little Help needed climbing 3-5 steps with a railing? : A Little 6 Click Score: 20    End of Session Equipment Utilized During Treatment: Gait belt Activity Tolerance: Patient limited by fatigue;Treatment limited secondary to medical complications (Comment) Patient left: in bed;with call bell/phone within reach Nurse Communication: Mobility status PT Visit Diagnosis: Unsteadiness on feet (R26.81);Pain;Difficulty in walking, not elsewhere classified (R26.2) Pain - Right/Left: Left Pain - part of body: Shoulder    Time: 1735-6701 PT Time Calculation (min) (ACUTE ONLY): 24 min   Charges:   PT Evaluation $PT Eval Moderate Complexity: 1 Mod PT Treatments $Therapeutic Activity: 8-22 mins       Ramond Dial 04/29/2022, 12:46 PM  Mee Hives, PT PhD Acute Rehab Dept. Number: [facility] [place]

## 2022-04-29 NOTE — Progress Notes (Signed)
ANTICOAGULATION CONSULT NOTE  Pharmacy Consult for heparin Indication: atrial fibrillation  Allergies  Allergen Reactions   Entresto [Sacubitril-Valsartan] Other (See Comments)    Hypotension    Lipitor [Atorvastatin] Other (See Comments)    Myalgias    Adhesive [Tape] Rash    Patient Measurements: Height: 5\' 10"  (177.8 cm) Weight: 60 kg (132 lb 4.4 oz) IBW/kg (Calculated) : 73 Heparin Dosing Weight: 60kg  Vital Signs: Temp: 98 F (36.7 C) (09/15 0705) Temp Source: Oral (09/15 0705) BP: 101/78 (09/15 1154) Pulse Rate: 124 (09/15 1154)  Labs: Recent Labs    04/27/22 1251 04/27/22 1257 04/28/22 0841 04/28/22 0858 04/28/22 1730 04/29/22 0042 04/29/22 0238 04/29/22 0700 04/29/22 1058  HGB  --  11.4*   < > 11.6* 10.5*  --   --  11.0*  --   HCT  --  35.7*   < > 34.0* 32.1*  --   --  33.7*  --   PLT  --  457*  --   --  404*  --   --  382  --   APTT  --   --   --   --   --  >200* 34  --  38*  HEPARINUNFRC  --   --   --   --   --  >1.10* 0.49  --   --   CREATININE 1.09  --   --   --  1.01  --   --  0.94  --    < > = values in this interval not displayed.     Estimated Creatinine Clearance: 60.3 mL/min (by C-G formula based on SCr of 0.94 mg/dL).   Medical History: Past Medical History:  Diagnosis Date   AICD (automatic cardioverter/defibrillator) present 02/11/2020   Anemia    Arthritis    Atrial tachycardia (HCC)    Chronic systolic CHF (congestive heart failure) ([place]) 07/04/2018   CKD (chronic kidney disease), stage III ([place])    Coronary artery disease 02/2012   a. s/p stenting in 2013  b. s/p CABGx2V (SVG--> PDA, SVG--> LCx).   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  Intolerant to PAP therapy   S/P TAVR (transcatheter aortic valve replacement) 05/15/2018   29 mm Edwards Sapien 3 transcatheter heart valve placed via percutaneous right transfemoral approach    Severe aortic stenosis     Medications:  Medications Prior to Admission  Medication Sig Dispense Refill Last Dose   acetaminophen (TYLENOL) 500 MG tablet Take 1,500 mg by mouth daily as needed for mild pain or headache.   04/27/2022   amitriptyline (ELAVIL) 25 MG tablet Take 25 mg by mouth at bedtime.    04/27/2022   B Complex-C (SUPER B COMPLEX PO) Take 1 tablet by mouth daily.   Past Week   dapagliflozin propanediol (FARXIGA) 10 MG TABS tablet Take 1 tablet (  10 mg total) by mouth daily. 90 tablet 3 Past Week   digoxin (LANOXIN) 0.125 MG tablet Take 1 tablet (0.125 mg total) by mouth daily. 30 tablet 3 Past Week   insulin detemir (LEVEMIR) 100 UNIT/ML injection Inject 10-24 Units into the skin See admin instructions. 20-24 units in the morning, 10 units at bedtime   Past Week   insulin lispro (HUMALOG) 100 UNIT/ML KwikPen Inject 0-4 Units into the skin See admin instructions. Per sliding scale at Breakfast and Dinner - pt could not tell me his sliding scale.   Past Week   levothyroxine (SYNTHROID, LEVOTHROID) 50 MCG tablet Take 50 mcg by mouth daily before breakfast.    Past Week   losartan (COZAAR) 25 MG tablet TAKE 1 TABLET(25 MG) BY MOUTH DAILY (Patient taking differently: Take 25 mg by mouth daily.) 90 tablet 3 Past Week   metFORMIN (GLUCOPHAGE) 500 MG tablet Take 2 tablets (1,000 mg total) by mouth 2 (two) times daily with a meal.   Past Week   metoprolol succinate (TOPROL-XL) 100 MG 24 hr tablet Take 0.5 tablets (50 mg total)  by mouth daily. Take with or immediately following a meal. (Patient taking differently: Take 50 mg by mouth in the morning.)   04/26/2022 at 08:00   potassium chloride SA (KLOR-CON M) 20 MEQ tablet Take 1 tablet (20 mEq total) by mouth daily. 180 tablet 3 Past Week   rivaroxaban (XARELTO) 20 MG TABS tablet Take 1 tablet (20 mg total) by mouth daily with supper. (Patient taking differently: Take 20 mg by mouth daily.) 28 tablet 0 04/26/2022 at 08:00   spironolactone (ALDACTONE) 25 MG tablet TAKE 1 TABLET(25 MG) BY MOUTH DAILY (Patient taking differently: Take 25 mg by mouth daily.) 30 tablet 3 Past Week   torsemide (DEMADEX) 20 MG tablet Take 2 tablets (40 mg total) by mouth daily. 180 tablet 3 Past Week   zolpidem (AMBIEN) 5 MG tablet Take 5 mg by mouth at bedtime as needed for sleep.   04/27/2022   simvastatin (ZOCOR) 20 MG tablet Take 20 mg by mouth every evening.  (Patient not taking: Reported on 04/28/2022)   Not Taking   Scheduled:   amitriptyline  25 mg Oral QHS   Chlorhexidine Gluconate Cloth  6 each Topical Daily   dapagliflozin propanediol  10 mg Oral Daily   digoxin  0.0625 mg Oral Daily   insulin aspart  0-15 Units Subcutaneous TID WC   insulin detemir  10 Units Subcutaneous Daily   levothyroxine  50 mcg Oral QAC breakfast   simvastatin  20 mg Oral QPM   sodium chloride flush  10-40 mL Intracatheter Q12H   sodium chloride flush  3 mL Intravenous Q12H   sodium chloride flush  3 mL Intravenous Q12H   spironolactone  25 mg Oral Daily   torsemide  40 mg Oral Daily   Infusions:   sodium chloride     sodium chloride     heparin 1,100 Units/hr (04/29/22 0316)   magnesium sulfate bolus IVPB     milrinone 0.25 mcg/kg/min (04/28/22 1937)    Assessment: Pharmacy consulted to dose heparin while PTA Xarelto is on hold. Last Xarelto dose charted as 9/12 PM. Pt is s/p RHC today. Pt is being worked up for VAD. aPTT is subtherapeutic at 38 today after dose increase. CBC is stable. Will continue  to follow coag plans.   Goal of Therapy:  Heparin level 0.3-0.7 units/ml aPTT 66-102 seconds Monitor platelets by anticoagulation protocol: Yes  Plan:  Increase heparin to 1250 units/h Recheck aPTT in 6h  Thank you for allowing pharmacy to participate in this patient's care.  Reatha Harps, PharmD PGY2 Pharmacy Resident 04/29/2022 12:13 PM Check [email or web address] for unit specific pharmacy number

## 2022-04-29 NOTE — Progress Notes (Signed)
Bedside report given to [facility], [place]. Patient remains in stable condition at this time.

## 2022-04-29 NOTE — Progress Notes (Signed)
Patient transported to CT in stable condition.

## 2022-04-29 NOTE — Progress Notes (Signed)
ANTICOAGULATION CONSULT NOTE  Pharmacy Consult for heparin Indication: atrial fibrillation  Allergies  Allergen Reactions   Entresto [Sacubitril-Valsartan] Other (See Comments)    Hypotension    Lipitor [Atorvastatin] Other (See Comments)    Myalgias    Adhesive [Tape] Rash    Patient Measurements: Height: 5\' 10"  (177.8 cm) Weight: 60 kg (132 lb 4.4 oz) IBW/kg (Calculated) : 73 Heparin Dosing Weight: 61.6 kg  Vital Signs: Temp: 98 F (36.7 C) (09/15 0024) Temp Source: Oral (09/15 0024) BP: 103/73 (09/15 0024) Pulse Rate: 118 (09/15 0024)  Labs: Recent Labs    04/27/22 1251 04/27/22 1257 04/28/22 0841 04/28/22 0847 04/28/22 0858 04/28/22 1730 04/29/22 0042  HGB  --  11.4*   < > 11.2* 11.6* 10.5*  --   HCT  --  35.7*   < > 33.0* 34.0* 32.1*  --   PLT  --  457*  --   --   --  404*  --   APTT  --   --   --   --   --   --  >200*  HEPARINUNFRC  --   --   --   --   --   --  >1.10*  CREATININE 1.09  --   --   --   --  1.01  --    < > = values in this interval not displayed.     Estimated Creatinine Clearance: 56.1 mL/min (by C-G formula based on SCr of 1.01 mg/dL).   Medical History: Past Medical History:  Diagnosis Date   AICD (automatic cardioverter/defibrillator) present 02/11/2020   Anemia    Arthritis    Atrial tachycardia (HCC)    Chronic systolic CHF (congestive heart failure) ([place]) 07/04/2018   CKD (chronic kidney disease), stage III ([place])    Coronary artery disease 02/2012   a. s/p stenting in 2013  b. s/p CABGx2V (SVG--> PDA, SVG--> LCx).   Diabetes mellitus    neuropathy  insulin dependent   Dilated aortic root (HCC)    Dyslipidemia    Erectile dysfunction    GERD (gastroesophageal reflux disease)    History of CVA (cerebrovascular accident)    History of kidney stones    History of radiation therapy 12/03/2018-12/13/2018   left lung  Dr Gery Pray   Hx of radiation therapy to mediastinum 1985   Hypertension    Malignant seminoma of  mediastinum ([place]) 1985   OSA (obstructive sleep apnea) 07/04/2018   Severe obstructive sleep apnea with an AHI of 30/h and mild central sleep apnea at 13.7/h with oxygen desaturations as low as 79%.  Intolerant to PAP therapy   S/P TAVR (transcatheter aortic valve replacement) 05/15/2018   29 mm Edwards Sapien 3 transcatheter heart valve placed via percutaneous right transfemoral approach    Severe aortic stenosis     Medications:  Medications Prior to Admission  Medication Sig Dispense Refill Last Dose   acetaminophen (TYLENOL) 500 MG tablet Take 1,500 mg by mouth daily as needed for mild pain or headache.   04/27/2022   amitriptyline (ELAVIL) 25 MG tablet Take 25 mg by mouth at bedtime.    04/27/2022   B Complex-C (SUPER B COMPLEX PO) Take 1 tablet by mouth daily.   Past Week   dapagliflozin propanediol (FARXIGA) 10 MG TABS tablet Take 1 tablet (10 mg total) by mouth daily. 90 tablet 3 Past Week   digoxin (LANOXIN) 0.125 MG tablet Take 1 tablet (0.125 mg total) by mouth daily. 30 tablet  3 Past Week   insulin detemir (LEVEMIR) 100 UNIT/ML injection Inject 10-24 Units into the skin See admin instructions. 20-24 units in the morning, 10 units at bedtime   Past Week   insulin lispro (HUMALOG) 100 UNIT/ML KwikPen Inject 0-4 Units into the skin See admin instructions. Per sliding scale at Breakfast and Dinner - pt could not tell me his sliding scale.   Past Week   levothyroxine (SYNTHROID, LEVOTHROID) 50 MCG tablet Take 50 mcg by mouth daily before breakfast.    Past Week   losartan (COZAAR) 25 MG tablet TAKE 1 TABLET(25 MG) BY MOUTH DAILY (Patient taking differently: Take 25 mg by mouth daily.) 90 tablet 3 Past Week   metFORMIN (GLUCOPHAGE) 500 MG tablet Take 2 tablets (1,000 mg total) by mouth 2 (two) times daily with a meal.   Past Week   metoprolol succinate (TOPROL-XL) 100 MG 24 hr tablet Take 0.5 tablets (50 mg total) by mouth daily. Take with or immediately following a meal. (Patient taking  differently: Take 50 mg by mouth in the morning.)   04/26/2022 at 08:00   potassium chloride SA (KLOR-CON M) 20 MEQ tablet Take 1 tablet (20 mEq total) by mouth daily. 180 tablet 3 Past Week   rivaroxaban (XARELTO) 20 MG TABS tablet Take 1 tablet (20 mg total) by mouth daily with supper. (Patient taking differently: Take 20 mg by mouth daily.) 28 tablet 0 04/26/2022 at 08:00   spironolactone (ALDACTONE) 25 MG tablet TAKE 1 TABLET(25 MG) BY MOUTH DAILY (Patient taking differently: Take 25 mg by mouth daily.) 30 tablet 3 Past Week   torsemide (DEMADEX) 20 MG tablet Take 2 tablets (40 mg total) by mouth daily. 180 tablet 3 Past Week   zolpidem (AMBIEN) 5 MG tablet Take 5 mg by mouth at bedtime as needed for sleep.   04/27/2022   simvastatin (ZOCOR) 20 MG tablet Take 20 mg by mouth every evening.  (Patient not taking: Reported on 04/28/2022)   Not Taking   Scheduled:   amitriptyline  25 mg Oral QHS   Chlorhexidine Gluconate Cloth  6 each Topical Daily   dapagliflozin propanediol  10 mg Oral Daily   digoxin  0.0625 mg Oral Daily   insulin aspart  0-15 Units Subcutaneous TID WC   levothyroxine  50 mcg Oral QAC breakfast   simvastatin  20 mg Oral QPM   sodium chloride flush  10-40 mL Intracatheter Q12H   sodium chloride flush  3 mL Intravenous Q12H   sodium chloride flush  3 mL Intravenous Q12H   spironolactone  25 mg Oral Daily   torsemide  40 mg Oral Daily   Infusions:   sodium chloride     sodium chloride     heparin 900 Units/hr (04/28/22 1937)   milrinone 0.25 mcg/kg/min (04/28/22 1937)    Assessment: Pharmacy consulted to dose heparin while PTA Xarelto is on hold. Last Xarelto dose charted as 9/12 PM. Pt is s/p RHC today. Pt is being worked up for VAD. Will continue to follow coag plans. No labs have been drawn thus far.   Initial heparin level >1.1 and aPTT >200 seconds but drawn incorrectly. Repeat labs show therapeutic heparin level (likely due to influence of recent DOAC) and  essentially normal aPTT at 34 seconds.   Goal of Therapy:  Heparin level 0.3-0.7 units/ml aPTT 66-102 seconds Monitor platelets by anticoagulation protocol: Yes   Plan:  Increase heparin to 1100 units/h Recheck aPTT in 6h  Arrie Senate, PharmD, BCPS, [facility]  Clinical Pharmacist Please check AMION for all [facility] Pharmacy numbers 04/29/2022

## 2022-04-29 NOTE — Inpatient Diabetes Management (Signed)
Inpatient Diabetes Program Recommendations  AACE/ADA: New Consensus Statement on Inpatient Glycemic Control (2015)  Target Ranges:  Prepandial:   less than 140 mg/dL      Peak postprandial:   less than 180 mg/dL (1-2 hours)      Critically ill patients:  140 - 180 mg/dL   Lab Results  Component Value Date   GLUCAP 234 (H) 04/29/2022   HGBA1C 9.6 (H) 04/29/2022    Review of Glycemic Control  Latest Reference Range & Units 04/28/22 08:03 04/28/22 09:27 04/28/22 12:19 04/28/22 17:24 04/28/22 21:21 04/29/22 06:10  Glucose-Capillary 70 - 99 mg/dL 261 (H) 222 (H) 163 (H) 430 (H) 192 (H) 234 (H)   Diabetes history: DM 2 Outpatient Diabetes medications: Farxiga 10 mg Daily, Levemir 20-24 units qam, 10 units qhs, Humalog SSI with breakfast and dinner, Metformin 1000 mg bid Current orders for Inpatient glycemic control:  Novolog 0-15 units tid Farxiga 10 mg Daily  A1c 9.6% on 9/14  Inpatient Diabetes Program Recommendations:    -  Add Semglee 10 units  Thanks,  Tama Headings RN, MSN, BC-ADM Inpatient Diabetes Coordinator Team Pager [phone] (8a-5p)

## 2022-04-29 NOTE — Progress Notes (Signed)
ANTICOAGULATION CONSULT NOTE  Pharmacy Consult for heparin Indication: atrial fibrillation  Allergies  Allergen Reactions   Entresto [Sacubitril-Valsartan] Other (See Comments)    Hypotension    Lipitor [Atorvastatin] Other (See Comments)    Myalgias    Adhesive [Tape] Rash    Patient Measurements: Height: 5\' 10"  (177.8 cm) Weight: 60 kg (132 lb 4.4 oz) IBW/kg (Calculated) : 73 Heparin Dosing Weight: 60kg  Vital Signs: Temp: 98.3 F (36.8 C) (09/15 2003) Temp Source: Oral (09/15 2003) BP: 104/75 (09/15 2003) Pulse Rate: 122 (09/15 2003)  Labs: Recent Labs    04/27/22 1251 04/27/22 1257 04/28/22 0841 04/28/22 0858 04/28/22 1730 04/29/22 0042 04/29/22 0042 04/29/22 0238 04/29/22 0700 04/29/22 1058 04/29/22 1927  HGB  --  11.4*   < > 11.6* 10.5*  --   --   --  11.0*  --   --   HCT  --  35.7*   < > 34.0* 32.1*  --   --   --  33.7*  --   --   PLT  --  457*  --   --  404*  --   --   --  382  --   --   APTT  --   --   --   --   --  >200*   < > 34  --  38* 37*  HEPARINUNFRC  --   --   --   --   --  >1.10*  --  0.49  --   --   --   CREATININE 1.09  --   --   --  1.01  --   --   --  0.94  --   --    < > = values in this interval not displayed.     Estimated Creatinine Clearance: 60.3 mL/min (by C-G formula based on SCr of 0.94 mg/dL).   Medical History: Past Medical History:  Diagnosis Date   AICD (automatic cardioverter/defibrillator) present 02/11/2020   Anemia    Arthritis    Atrial tachycardia (HCC)    Chronic systolic CHF (congestive heart failure) ([place]) 07/04/2018   CKD (chronic kidney disease), stage III ([place])    Coronary artery disease 02/2012   a. s/p stenting in 2013  b. s/p CABGx2V (SVG--> PDA, SVG--> LCx).   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  Intolerant to PAP therapy   S/P TAVR (transcatheter aortic valve replacement) 05/15/2018   29 mm Edwards Sapien 3 transcatheter heart valve placed via percutaneous right transfemoral approach    Severe aortic stenosis     Medications:  Medications Prior to Admission  Medication Sig Dispense Refill Last Dose   acetaminophen (TYLENOL) 500 MG tablet Take 1,500 mg by mouth daily as needed for mild pain or headache.   04/27/2022   amitriptyline (ELAVIL) 25 MG tablet Take 25  mg by mouth at bedtime.    04/27/2022   B Complex-C (SUPER B COMPLEX PO) Take 1 tablet by mouth daily.   Past Week   dapagliflozin propanediol (FARXIGA) 10 MG TABS tablet Take 1 tablet (10 mg total) by mouth daily. 90 tablet 3 Past Week   digoxin (LANOXIN) 0.125 MG tablet Take 1 tablet (0.125 mg total) by mouth daily. 30 tablet 3 Past Week   insulin detemir (LEVEMIR) 100 UNIT/ML injection Inject 10-24 Units into the skin See admin instructions. 20-24 units in the morning, 10 units at bedtime   Past Week   insulin lispro (HUMALOG) 100 UNIT/ML KwikPen Inject 0-4 Units into the skin See admin instructions. Per sliding scale at Breakfast and Dinner - pt could not tell me his sliding scale.   Past Week   levothyroxine (SYNTHROID, LEVOTHROID) 50 MCG tablet Take 50 mcg by mouth daily before breakfast.    Past Week   losartan (COZAAR) 25 MG tablet TAKE 1 TABLET(25 MG) BY MOUTH DAILY (Patient taking differently: Take 25 mg by mouth daily.) 90 tablet 3 Past Week   metFORMIN (GLUCOPHAGE) 500 MG tablet Take 2 tablets (1,000 mg total) by mouth 2 (two) times daily with a meal.   Past  Week   metoprolol succinate (TOPROL-XL) 100 MG 24 hr tablet Take 0.5 tablets (50 mg total) by mouth daily. Take with or immediately following a meal. (Patient taking differently: Take 50 mg by mouth in the morning.)   04/26/2022 at 08:00   potassium chloride SA (KLOR-CON M) 20 MEQ tablet Take 1 tablet (20 mEq total) by mouth daily. 180 tablet 3 Past Week   rivaroxaban (XARELTO) 20 MG TABS tablet Take 1 tablet (20 mg total) by mouth daily with supper. (Patient taking differently: Take 20 mg by mouth daily.) 28 tablet 0 04/26/2022 at 08:00   spironolactone (ALDACTONE) 25 MG tablet TAKE 1 TABLET(25 MG) BY MOUTH DAILY (Patient taking differently: Take 25 mg by mouth daily.) 30 tablet 3 Past Week   torsemide (DEMADEX) 20 MG tablet Take 2 tablets (40 mg total) by mouth daily. 180 tablet 3 Past Week   zolpidem (AMBIEN) 5 MG tablet Take 5 mg by mouth at bedtime as needed for sleep.   04/27/2022   simvastatin (ZOCOR) 20 MG tablet Take 20 mg by mouth every evening.  (Patient not taking: Reported on 04/28/2022)   Not Taking   Infusions:   sodium chloride     sodium chloride     heparin 1,300 Units/hr (04/29/22 1656)   milrinone 0.25 mcg/kg/min (04/28/22 1937)    Assessment: Pharmacy consulted to dose heparin while PTA Xarelto is on hold. Last Xarelto dose charted as 9/12 PM. Pt is s/p RHC today. Pt is being worked up for VAD. CBC is stable. Will continue to follow coag plans.   PM f/u > aPTT checked a little soon after rate increase, but remains low at 37.  No known issues with IV infusion.  No overt bleeding or complications noted.  Goal of Therapy:  Heparin level 0.3-0.7 units/ml aPTT 66-102 seconds Monitor platelets by anticoagulation protocol: Yes   Plan:  Increase heparin to 1450 units/hr Recheck aPTT in 6h Daily aPTT, heparin level and CBC.  Thank you for allowing pharmacy to participate in this patient's care.  [place] Crane, Roylene Reason, BCCP Clinical Pharmacist  04/29/2022 8:23  PM   [facility] pharmacy phone numbers are listed on [email or web address]

## 2022-04-29 NOTE — Progress Notes (Signed)
Initial Nutrition Assessment  DOCUMENTATION CODES:   Severe malnutrition in context of chronic illness  INTERVENTION:  Liberalize diet from a heart healthy/carb modified to a carb modified diet to provide widest variety of menu options to enhance nutritional adequacy Ensure Enlive po TID, each supplement provides 350 kcal and 20 grams of protein. 30 ml ProSource Plus BID, each supplement provides 100 kcals and 15 grams protein.  MVI with minerals daily Snacks TID to optimize nutritional intake  NUTRITION DIAGNOSIS:   Severe Malnutrition related to chronic illness (HF, AS) as evidenced by severe fat depletion, severe muscle depletion.  GOAL:   Patient will meet greater than or equal to 90% of their needs  MONITOR:   PO intake, Supplement acceptance, Diet advancement, Labs, Weight trends, I & O's  REASON FOR ASSESSMENT:   Consult Other (Comment) (LVAD work up)  ASSESSMENT:   Admitted with acute on chronic systolic CHF with low-output. PMH significant squamous cell lung cancer LLL (1985), CAD, CABGx2 (2013), aortic stenosis s/p TAVR (2019), chronic HFrEF and LBBB, St [employer] CRT-D, HTN, OSA, T2DM   9/14 s/p L/RHC  Pt on and off unit throughout the day for full evaluation for possible VAD. Plans for CT this afternoon. Per Cardiology, if CT shows L pleural effusion, may been thoracentesis for cytology. Pt is high risk of recurrence of lung cancer.  Met with pt at bedside between testing. He reports having a fair appetite. He reports having a 60 lb weight loss in the last year. Initially he denies having had any changes to his PO intake but upon further questioning he states that his appetite has decreased and his portion sizes have gotten smaller.   He recalls eating a [employer] breakfast sandwich before work. He does not typically eat lunch. Dinner consists of a take out meal from places such as [employer] or [employer] (steak and cheese sandwich with homemade potato chips). He does  not usually cook/prepare meals at home. On occasion he consumes a Boost Plus for breakfast.   Meal completions: 9/14: 25%-dinner 9/15: 100% breakfast  He reports monitoring his blood sugars with a continuous glucose monitor and his blood sugars usually average in the 200s. His last known A1c was 8% which is endocrinologist was not pleased with per pt. He has been taking levemir and sliding scale insulin at home.   Reviewed weight history. His weight appear to fluctuate up and down ~1-2 kg at a time but has overall trended down within the last year. Pt appears to have had a 17.2% weight loss since 05/06/21 which is concerning but not clinically significant for time frame  Given his malnutrition, decreased appetite and work up for possible VAD, he would benefit from a liberalized diet to help optimize nutritional intake. Also dicussed with pt different nutrition supplements to increase protein intake to prevent further muscle losses.   Medications: farxiga, SSI 0-15 units TID, levemir 10 units daily, synthroid, torsemide  Labs: BUN 27, AST 14, HDL 39 (L), HgbA1c 9.6%, CBG's 163-430 x24 hours  UOP: 4L x23 hours I/O's: -2858m since admission  NUTRITION - FOCUSED PHYSICAL EXAM:  Flowsheet Row Most Recent Value  Orbital Region Severe depletion  Upper Arm Region Severe depletion  Thoracic and Lumbar Region Severe depletion  Buccal Region Severe depletion  Temple Region Severe depletion  Clavicle Bone Region Severe depletion  Clavicle and Acromion Bone Region Severe depletion  Scapular Bone Region Severe depletion  Dorsal Hand Severe depletion  Patellar Region Severe depletion  Anterior  Thigh Region Severe depletion  Posterior Calf Region Severe depletion  Edema (RD Assessment) None  Hair Reviewed  Eyes Reviewed  Mouth Reviewed  Skin Reviewed  Nails Reviewed      Diet Order:   Diet Order             Diet Carb Modified Fluid consistency: Thin; Room service appropriate? Yes;  Fluid restriction: 1500 mL Fluid  Diet effective now                  EDUCATION NEEDS:   Education needs have been addressed  Skin:  Skin Assessment: Reviewed RN Assessment  Last BM:  PTA  Height:   Ht Readings from Last 1 Encounters:  04/28/22 5' 10" (1.778 m)   Weight:   Wt Readings from Last 1 Encounters:  04/29/22 60 kg   Ideal Body Weight:  75.5 kg  BMI:  Body mass index is 18.98 kg/m.  Estimated Nutritional Needs:   Kcal:  1900-2100  Protein:  95-110g  Fluid:  1.5L  Clayborne Dana, RDN, LDN Clinical Nutrition

## 2022-04-29 NOTE — TOC Initial Note (Signed)
Transition of Care [facility]) - Initial/Assessment Note    Patient Details  Name: [name] MRN: [id number] Date of Birth: 04-Nov-1950  Transition of Care [facility]) CM/SW Contact:    Erenest Rasher, RN Phone Number: [phone] 04/29/2022, 4:54 PM  Clinical Narrative:                 HF TOC CM spoke to pt and independent PTA. Pt reports he still works full-time at [employer] Has scale at home. Pt states he plans to work on adhering to low sodium heart healthy diet. Pt reports he does not cook. Discussed healthier options. Unit RN will provide education to pt. Dietician spoke to patient about healthier options. Unit RN will have respiratory come and evaluate for alternate mask for CPAP machine. Will continue to follow for dc needs.   Expected Discharge Plan: Home/Self Care Barriers to Discharge: Continued Medical Work up   Patient Goals and CMS Choice Patient states their goals for this hospitalization and ongoing recovery are:: wants to remain independent CMS Medicare.gov Compare Post Acute Care list provided to:: Patient    Expected Discharge Plan and Services Expected Discharge Plan: Home/Self Care   Discharge Planning Services: CM Consult   Living arrangements for the past 2 months: Single Family Home                                      Prior Living Arrangements/Services Living arrangements for the past 2 months: Single Family Home Lives with:: Self Patient language and need for interpreter reviewed:: Yes Do you feel safe going back to the place where you live?: Yes      Need for Family Participation in Patient Care: No (Comment) Care giver support system in place?: Yes (comment) Current home services: DME (CPAP, scale) Criminal Activity/Legal Involvement Pertinent to Current Situation/Hospitalization: No - Comment as needed  Activities of Daily Living      Permission Sought/Granted Permission sought to share information with : Case Manager, Family  Supports, PCP Permission granted to share information with : Yes, Verbal Permission Granted  Share Information with NAME: [place] granted to share info w Relationship: sons  Permission granted to share info w Contact Information: [phone]  Emotional Assessment Appearance:: Appears stated age Attitude/Demeanor/Rapport: Engaged Affect (typically observed): Accepting Orientation: : Oriented to Self, Oriented to Place, Oriented to  Time, Oriented to Situation   Psych Involvement: No (comment)  Admission diagnosis:  Acute on chronic systolic CHF (congestive heart failure) (HCC) [I50.23] Patient Active Problem List   Diagnosis Date Noted   Persistent atrial fibrillation ([place]) 03/23/2022   Secondary hypercoagulable state ([place]) 03/23/2022   Hypothyroidism 09/30/2021   Acute renal failure superimposed on stage 3a chronic kidney disease ([place]) 09/30/2021   Elevated troponin 09/30/2021   Normal anion gap metabolic acidosis 32/95/1884   Acute on chronic systolic (congestive) heart failure ([place]) 09/30/2021   Acute on chronic systolic CHF (congestive heart failure) ([place]) 08/30/2021   Ischemic cardiomyopathy 02/11/2020   Left bundle branch block 01/08/2020   Squamous cell carcinoma of bronchus in left lower lobe ([place]) 11/22/2018   Nodule of lower lobe of left lung 10/27/2018   OSA (obstructive sleep apnea) 16/60/6301   Chronic systolic CHF (congestive heart failure) ([place]) 07/04/2018   S/P TAVR (transcatheter aortic valve replacement) 05/15/2018   History of CVA (cerebrovascular accident)    Cardiomyopathy ([place]) 11/11/2016  Aortic stenosis, severe 06/27/2014   AAA (abdominal aortic aneurysm) (HCC)    Insomnia    Hypertension    SOB (shortness of breath)    Insulin dependent type 2 diabetes mellitus ([place]) 12/28/2012   Dyslipidemia 12/28/2012   CAD (coronary artery disease), native coronary artery 02/13/2012   Hx of radiation therapy to mediastinum 08/16/1983    Malignant seminoma of mediastinum ([place]) 08/16/1983   PCP:  [name], MD Pharmacy:   [facility] [place], [place] - [address] [place] [address] Phone: [phone] Fax: [phone]     Social Determinants of Health (SDOH) Interventions    Readmission Risk Interventions     No data to display

## 2022-04-29 NOTE — Evaluation (Signed)
Occupational Therapy Evaluation Patient Details Name: [name] MRN: [id number] DOB: 1950/11/12 Today's Date: 04/29/2022   History of Present Illness Patient is a 71 yo male admitted on 04/28/22 with shortness of breath, CHF exacerbation. Mult admissions this year for same.  Pt works as Forensic psychologist for [employer].   PMH includes: CAD, CABG, chronic systolic CHF EF of 42%, status post AICD, aortic stenosis status post TAVR, remote history of squamous cell carcinoma of the left lower lobe status postradiation, CKD stage IIIb, type 2 diabetes, aortic aneurysm, hypertension, CVA, OSA not on CPAP.   Clinical Impression   Pt in bed upon therapy arrival and agreeable to participate in OT evaluation. At rest in bed, pt tachycardic with HR in the 130's at rest. Bedside evaluation completed due to HR. Provided education on energy conservation with handout provided. Discussed examples of using energy conservation techniques when completing tasks at home. Pt currently is presenting with decreased strength, activity tolerance, and endurance resulting in needing increased assistance and time to complete BADL tasks. Pt may benefit from [facility] OT when discharged home to provide compensatory techniques/strategies for completing ADL tasks and increase his overall independence and endurance when completing needed activities. OT will follow patient acutely.     Recommendations for follow up therapy are one component of a multi-disciplinary discharge planning process, led by the attending physician.  Recommendations may be updated based on patient status, additional functional criteria and insurance authorization.   Follow Up Recommendations  Home health OT (or follow physician's recommendation after VAD is placed.)    Assistance Recommended at Discharge PRN  Patient can return home with the following A little help with walking and/or transfers;A little help with bathing/dressing/bathroom;Help with stairs or ramp for  entrance;Assist for transportation;Assistance with cooking/housework    Functional Status Assessment  Patient has had a recent decline in their functional status and demonstrates the ability to make significant improvements in function in a reasonable and predictable amount of time.  Equipment Recommendations  Tub/shower seat       Precautions / Restrictions Precautions Precautions: Fall Precaution Comments: monitor HR - tacycardia at rest, SOB with exertion Restrictions Weight Bearing Restrictions: No      Mobility Bed Mobility Overal bed mobility:  (see PT eval)      Transfers Overall transfer level:  (See PT eval)      Balance Overall balance assessment:  (see PT eval)         ADL either performed or assessed with clinical judgement   ADL Overall ADL's : Needs assistance/impaired Eating/Feeding: Bed level;Modified independent   Grooming: Wash/dry hands;Wash/dry face;Oral care;Applying deodorant;Minimal assistance;Bed level   Upper Body Bathing: Minimal assistance;Bed level   Lower Body Bathing: Bed level;Minimal assistance   Upper Body Dressing : Minimal assistance;Bed level   Lower Body Dressing: Bed level;Minimal assistance   Toilet Transfer: Minimal assistance;Squat-pivot;Regular Toilet   Toileting- Clothing Manipulation and Hygiene: Min guard;Sit to/from stand         General ADL Comments: Due to tachycardia, bed level evaluation completed with clinical judgment used to determine assist needs for ADL tasks.     Vision Baseline Vision/History: 0 No visual deficits Ability to See in Adequate Light: 0 Adequate Patient Visual Report: No change from baseline Vision Assessment?: No apparent visual deficits     Perception Perception Perception: Within Functional Limits   Praxis Praxis Praxis: Intact    Pertinent Vitals/Pain Pain Assessment Pain Assessment: Faces Faces Pain Scale: Hurts little more Pain Location: back and  shoulder pain Pain  Descriptors / Indicators: Grimacing Pain Intervention(s): Repositioned (placed small towel roll for lumbar support. Repositioned pillow for neck)     Hand Dominance Right   Extremity/Trunk Assessment Upper Extremity Assessment Upper Extremity Assessment: Generalized weakness   Lower Extremity Assessment Lower Extremity Assessment: Defer to PT evaluation   Cervical / Trunk Assessment Cervical / Trunk Assessment: Kyphotic   Communication Communication Communication: No difficulties   Cognition Arousal/Alertness: Awake/alert Behavior During Therapy: Flat affect Overall Cognitive Status: Within Functional Limits for tasks assessed          Exercises Other Exercises Other Exercises: Therapist educated patient on energy conservation techniques with handout provided.        Home Living Family/patient expects to be discharged to:: Private residence Living Arrangements: Alone Available Help at Discharge: Family;Available PRN/intermittently Type of Home: House Home Access: Level entry     Home Layout: One level     Bathroom Shower/Tub: Occupational psychologist: Standard Bathroom Accessibility: No   Home Equipment: Conservation officer, nature (2 wheels)   Additional Comments: Patient living alone and still driving and working at [employer] full time [employer] work. Currently going through a divorce.      Prior Functioning/Environment Prior Level of Function : Independent/Modified Independent;Working/employed;Driving       Mobility Comments: No AD used for mobility ADLs Comments: Independent, does not cook, endorses driving to fast food restaurants for meals        OT Problem List: Decreased strength;Decreased activity tolerance;Decreased knowledge of use of DME or AE;Cardiopulmonary status limiting activity      OT Treatment/Interventions: Self-care/ADL training;Therapeutic exercise;Therapeutic activities;Energy conservation;DME and/or AE  instruction;Patient/family education;Balance training;Manual therapy;Modalities    OT Goals(Current goals can be found in the care plan section) Acute Rehab OT Goals Patient Stated Goal: to get better OT Goal Formulation: With patient Time For Goal Achievement: 05/13/22 Potential to Achieve Goals: Fair  OT Frequency: Min 2X/week       AM-PAC OT "6 Clicks" Daily Activity     Outcome Measure Help from another person eating meals?: None Help from another person taking care of personal grooming?: A Little Help from another person toileting, which includes using toliet, bedpan, or urinal?: A Little Help from another person bathing (including washing, rinsing, drying)?: A Little Help from another person to put on and taking off regular upper body clothing?: A Little Help from another person to put on and taking off regular lower body clothing?: A Little 6 Click Score: 19   End of Session Equipment Utilized During Treatment: Oxygen  Activity Tolerance: Patient limited by fatigue;Treatment limited secondary to medical complications (Comment) (tachycardia at rest) Patient left: in bed;with call bell/phone within reach  OT Visit Diagnosis: Muscle weakness (generalized) (M62.81)                Time: 2353-6144 OT Time Calculation (min): 20 min Charges:  OT General Charges $OT Visit: 1 Visit OT Evaluation $OT Eval Low Complexity: 1 Low  Ailene Ravel, OTR/L,CBIS  Supplemental OT - [facility] and [facility]   Chiann Goffredo, Clarene Duke 04/29/2022, 3:59 PM

## 2022-04-29 NOTE — Progress Notes (Addendum)
Advanced Heart Failure Rounding Note  PCP-Cardiologist: Fransico Him, MD   Subjective:    On Milrinone 0.25 mcg/kg/min. Co-ox 69%   4L in UOP yesterday post cath w/ PO torsemide. CVP 7   Scr 0.94. K 3.8   Denies CP. No dyspnea. Appears to be in ?Afib, V-rates 120s. SBPs 110s.   VAD evaluation underway.    R/LHC 9/14  RA 12 PA 61/26 (39) PCWP 20 (v 27) Fick CO/CI 3.1/1.7 PVR 6.1 PAPi 2.9 PA sat 45%, 47% 3 v CAD with chronically occluded RCA and LCX. Patent SVGs to R PDA and OM. 90% p and 95% d LAD.     Objective:   Weight Range: 60 kg Body mass index is 18.98 kg/m.   Vital Signs:   Temp:  [97.6 F (36.4 C)-98.1 F (36.7 C)] 98 F (36.7 C) (09/15 0705) Pulse Rate:  [101-121] 121 (09/15 0900) Resp:  [17-26] 25 (09/15 0855) BP: (97-119)/(59-78) 110/77 (09/15 0855) SpO2:  [95 %-100 %] 99 % (09/15 0855) Weight:  [60 kg-61.6 kg] 60 kg (09/15 0203)    Weight change: Filed Weights   04/28/22 0749 04/28/22 1208 04/29/22 0203  Weight: 60.8 kg 61.6 kg 60 kg    Intake/Output:   Intake/Output Summary (Last 24 hours) at 04/29/2022 1002 Last data filed at 04/29/2022 0903 Gross per 24 hour  Intake 1172.78 ml  Output 4025 ml  Net -2852.22 ml      Physical Exam    CVP 7 General:  Well appearing. No resp difficulty HEENT: Normal Neck: Supple. JVP 7-8 cm . Carotids 2+ bilat; no bruits. No lymphadenopathy or thyromegaly appreciated. Cor: PMI nondisplaced. Regular rhythm, tachy rate. No rubs, gallops or murmurs. Lungs: Clear Abdomen: Soft, nontender, nondistended. No hepatosplenomegaly. No bruits or masses. Good bowel sounds. Extremities: No cyanosis, clubbing, rash, edema + RUE PICC  Neuro: Alert & orientedx3, cranial nerves grossly intact. moves all 4 extremities w/o difficulty. Affect pleasant   Telemetry   Sinus tach 110s-120s   EKG    Sinus tach 116 bpm, LBBB   Labs    CBC Recent Labs    04/28/22 1730 04/29/22 0700  WBC 7.8 7.1   NEUTROABS 6.0  --   HGB 10.5* 11.0*  HCT 32.1* 33.7*  MCV 71.3* 71.1*  PLT 404* 528   Basic Metabolic Panel Recent Labs    04/27/22 1251 04/28/22 0841 04/28/22 0858 04/28/22 1730  NA 137   < > 136 136  K 4.4   < > 4.6 4.3  CL 100  --   --  104  CO2 28  --   --  22  GLUCOSE 91  --   --  438*  BUN 32*  --   --  27*  CREATININE 1.09  --   --  1.01  CALCIUM 9.0  --   --  8.2*  MG  --   --   --  1.8   < > = values in this interval not displayed.   Liver Function Tests Recent Labs    04/28/22 1730  AST 14*  ALT 10  ALKPHOS 82  BILITOT 0.6  PROT 6.5  ALBUMIN 2.4*   No results for input(s): "LIPASE", "AMYLASE" in the last 72 hours. Cardiac Enzymes No results for input(s): "CKTOTAL", "CKMB", "CKMBINDEX", "TROPONINI" in the last 72 hours.  BNP: BNP (last 3 results) Recent Labs    03/08/22 1114 03/30/22 1028 04/27/22 1251  BNP 1,980.1* 1,880.7* 1,466.6*    ProBNP (last  3 results) No results for input(s): "PROBNP" in the last 8760 hours.   D-Dimer No results for input(s): "DDIMER" in the last 72 hours. Hemoglobin A1C Recent Labs    04/29/22 0700  HGBA1C 9.6*   Fasting Lipid Panel Recent Labs    04/29/22 0700  CHOL 119  HDL 39*  LDLCALC 65  TRIG 76  CHOLHDL 3.1   Thyroid Function Tests Recent Labs    04/29/22 0700  TSH 4.100    Other results:   Imaging    [place] EKG SITE RITE  Result Date: 04/28/2022 If Site Rite image not attached, placement could not be confirmed due to current cardiac rhythm.    Medications:     Scheduled Medications:  amitriptyline  25 mg Oral QHS   Chlorhexidine Gluconate Cloth  6 each Topical Daily   dapagliflozin propanediol  10 mg Oral Daily   digoxin  0.0625 mg Oral Daily   insulin aspart  0-15 Units Subcutaneous TID WC   levothyroxine  50 mcg Oral QAC breakfast   simvastatin  20 mg Oral QPM   sodium chloride flush  10-40 mL Intracatheter Q12H   sodium chloride flush  3 mL Intravenous Q12H   sodium  chloride flush  3 mL Intravenous Q12H   spironolactone  25 mg Oral Daily   torsemide  40 mg Oral Daily    Infusions:  sodium chloride     sodium chloride     heparin 1,100 Units/hr (04/29/22 0316)   milrinone 0.25 mcg/kg/min (04/28/22 1937)    PRN Medications: sodium chloride, sodium chloride, acetaminophen, ondansetron (ZOFRAN) IV, sodium chloride flush, sodium chloride flush, sodium chloride flush, zolpidem    Patient Profile   71 y/o male with chronic systolic HF due to ICM with several weeks progressive HF symptoms. Presented 9/14 for outpatient [facility] which demonstrated low cardiac output Fick CO/CI 3.1/1.7. Admitted from cath lab for HF optimization w/ initiation of milrinone and VAD w/u.   Assessment/Plan   Chronic Systolic Heart Failure: - s/p St Jude CRT-D 01/2020. Unable to place LV lead. Interestingly, he had a RBBB during a recent admission and had LBBB on prior ECGs. Discussed with Dr. Lovena Le, with current RBBB a left bundle lead will not be likely to help.  - Had PYP 2020 not suggestive of amyloid.  - Echo (1/23): EF 20-25%, RV significantly reduced, RVSP 50 mmHg, moderate MR, moderate TR, mean gradient 2 mmHg across aortic valve prosthesis. - CPX (3/23): moderate to severe HF limitation. - R/LHC 9/14: RA 12, PA mean 39, PCWP 20, CI 1.7.  - On milrinone 0.25. Co-ox 69% - CVP 7-8. Continue Torsemide 40 mg daily.  - Continue Farxiga 10 mg daily.  - Continue digoxin 0.125 mg daily. - Start losartan 12.5 mg daily (failed Entresto in past due to low BP).  - Continue spironolactone 25 mg daily. - off ? blocker due to low output  - Corlanor previously stopped d/t cost.  - Not a candidate for transplant (age). Would be high risk for VAD. Has history of CABG X 2 and is small in size which may be prohibitive. He is willing to proceed with workup to determine if he would be a candidate for VAD. He has support from his 2 sons who live nearby. If too high risk for VAD here at  [facility], can send to [facility] for evaluation.    2. CAD - h/o CABG 2013 - LHC this adm: patent SVGs to R PDA and OM. 90% p and 95%  d LAD - No s/s angina. - No ASA with A/C. - Continue simvastatin 20 mg daily. LDL ok at 65 mg/dL    3. Persistent AFib - In AFib by device 03/14/22, this is new for him. - CHA2DS2-VASc score is 5 - Switch Xarelto to heparin gtt in anticipation of VAD workup. - He tolerates AFib, RVR today in setting of milrinone  - low threshold to add amio gtt. Will d/w MD  - Continue CPAP   4. DM2 - Insulin dependent. - Hgb A1C 9.6  - On SGLT2i. - Managed by PCP.   5. OSA - Continue CPAP.   6. Aortic valve stenosis - S/P TAVR 2019 with 29 mm S3 - Valve okay on  echo 01/23   7. Hypothyroid - On levothyroxine.    8. CKD Stage IIIa - SCr baseline 1.1-1.3 - Scr 0.94 today   - follow BMP      Length of Stay: 1  Bill Simmons, PA-C  04/29/2022, 10:02 AM  Advanced Heart Failure Team Pager [phone] (M-F; 7a - 5p)  Please contact [facility] for night-coverage after hours (5p -7a ) and weekends on [email or web address]  ------------------------------- Patient seen with PA, agree with the above note.   Subjective: Reports improvement in SOB and functional status today with the addition of milrinone and diuresis.    Exam: General: AAM sitting up at bedside; NAD, eating breakfast HEENT: Normal.  Neck: Thick neck.  no thyromegaly or thyroid nodule.  Lungs: Clear to auscultation bilaterally with normal respiratory effort. CV: tachycardic; irregular, no murmurs, NS1S2; JVP 7-8cm H2O Abdomen: Soft, nontender, no hepatosplenomegaly, no distention.  Skin: Intact without lesions or rashes.  Neurologic: Confused.  Psych: Normal affect. Extremities: No significant edema   A/P 71 YO AAM w/ iCMP currently undergoing LVAD evaluation; in good spirits and motivated to pursue LVAD as a treatment option. PFTs and CT scan today with results pending. On evaluation, JVP <8cm  H2O, unfortunately received this AM dose of torsemide. Will hold off on further diuretics and continue prn tomorrow. Mixed venous up to 68 from 47. Will start low dose losartan for afterload reduction.   [name] Advanced Heart Failure 1:49 PM

## 2022-04-29 NOTE — Progress Notes (Signed)
[facility] EDUCATION NOTE:                Met with patient at bedside this afternoon, and reviewed VAD equipment.   Provided brief equipment overview and demonstration with HeartMate III training loop including discussion on the following:   a) mobile power unit b) system controller   c) universal Charity fundraiser   d) battery clips   e) Batteries   f)  Perc lock   g) Percutaneous lead   Explained need for 24/7 care when pt is discharged home due to sternal precautions, adaptation to living on support, emotional support, consistent and meticulous exit site care and management, medication adherence and high volume of follow up visits with the [facility] after discharge; pt verbalized understanding of above. He states he thinks his sons will be able to function as caregivers, and that they may be able to move in with him. He plans to discuss this further with them this weekend.  Discussed VAD drive line, site care, dressing changes, and drive line stabilization including securement attachment device.                                VAD evaluation underway. PFTs and labs completed this morning. Plan for dopplers and CT scans to be completed this afternoon. Dr Prescott Gum consulted on pt this morning.   All questions have been answered at this time and contact information was provided should they encounter any further questions.    Emerson Monte RN [place] Coordinator  Office: [phone]  24/7 Pager: [phone]

## 2022-04-29 NOTE — Progress Notes (Addendum)
Pre-VAD ultrasound study completed.   Please see CV Proc for preliminary results.   Darlin Coco, RDMS, RVT

## 2022-04-30 ENCOUNTER — Encounter (HOSPITAL_COMMUNITY): Payer: Self-pay | Admitting: Internal Medicine

## 2022-04-30 DIAGNOSIS — I5023 Acute on chronic systolic (congestive) heart failure: Secondary | ICD-10-CM | POA: Diagnosis not present

## 2022-04-30 DIAGNOSIS — E43 Unspecified severe protein-calorie malnutrition: Secondary | ICD-10-CM | POA: Insufficient documentation

## 2022-04-30 DIAGNOSIS — Z515 Encounter for palliative care: Secondary | ICD-10-CM

## 2022-04-30 LAB — LUPUS ANTICOAGULANT PANEL
DRVVT: 45.2 s (ref 0.0–47.0)
PTT Lupus Anticoagulant: 37.2 s (ref 0.0–43.5)

## 2022-04-30 LAB — BASIC METABOLIC PANEL
Anion gap: 13 (ref 5–15)
BUN: 27 mg/dL — ABNORMAL HIGH (ref 8–23)
CO2: 26 mmol/L (ref 22–32)
Calcium: 8.9 mg/dL (ref 8.9–10.3)
Chloride: 95 mmol/L — ABNORMAL LOW (ref 98–111)
Creatinine, Ser: 0.98 mg/dL (ref 0.61–1.24)
GFR, Estimated: >60 mL/min (ref >60)
Glucose, Bld: 158 mg/dL — ABNORMAL HIGH (ref 70–99)
Potassium: 3.7 mmol/L (ref 3.5–5.1)
Sodium: 134 mmol/L — ABNORMAL LOW (ref 135–145)

## 2022-04-30 LAB — CBC
HCT: 32.6 % — ABNORMAL LOW (ref 39.0–52.0)
Hemoglobin: 11.1 g/dL — ABNORMAL LOW (ref 13.0–17.0)
MCH: 23.5 pg — ABNORMAL LOW (ref 26.0–34.0)
MCHC: 34 g/dL (ref 30.0–36.0)
MCV: 69.1 fL — ABNORMAL LOW (ref 80.0–100.0)
Platelets: 372 10*3/uL (ref 150–400)
RBC: 4.72 MIL/uL (ref 4.22–5.81)
RDW: 17.6 % — ABNORMAL HIGH (ref 11.5–15.5)
WBC: 8 10*3/uL (ref 4.0–10.5)
nRBC: 0 % (ref 0.0–0.2)

## 2022-04-30 LAB — APTT
aPTT: 49 seconds — ABNORMAL HIGH (ref 24–36)
aPTT: 57 seconds — ABNORMAL HIGH (ref 24–36)

## 2022-04-30 LAB — COOXEMETRY PANEL
Carboxyhemoglobin: 0.9 % (ref 0.5–1.5)
Methemoglobin: 0.8 % (ref 0.0–1.5)
O2 Saturation: 57.7 %
Total hemoglobin: 10.7 g/dL — ABNORMAL LOW (ref 12.0–16.0)

## 2022-04-30 LAB — PREALBUMIN: Prealbumin: 17 mg/dL — ABNORMAL LOW (ref 18–38)

## 2022-04-30 LAB — GLUCOSE, CAPILLARY
Glucose-Capillary: 168 mg/dL — ABNORMAL HIGH (ref 70–99)
Glucose-Capillary: 172 mg/dL — ABNORMAL HIGH (ref 70–99)
Glucose-Capillary: 247 mg/dL — ABNORMAL HIGH (ref 70–99)
Glucose-Capillary: 380 mg/dL — ABNORMAL HIGH (ref 70–99)
Glucose-Capillary: 448 mg/dL — ABNORMAL HIGH (ref 70–99)

## 2022-04-30 LAB — HEPARIN LEVEL (UNFRACTIONATED): Heparin Unfractionated: 0.49 IU/mL (ref 0.30–0.70)

## 2022-04-30 LAB — LIPOPROTEIN A (LPA): Lipoprotein (a): 270.3 nmol/L — ABNORMAL HIGH (ref <75.0)

## 2022-04-30 MED ORDER — TORSEMIDE 20 MG PO TABS: 40.0000 mg | ORAL_TABLET | Freq: Every day | ORAL | Status: DC | PRN

## 2022-04-30 NOTE — Progress Notes (Signed)
ANTICOAGULATION CONSULT NOTE  Pharmacy Consult for heparin Indication: atrial fibrillation  Allergies  Allergen Reactions   Entresto [Sacubitril-Valsartan] Other (See Comments)    Hypotension    Lipitor [Atorvastatin] Other (See Comments)    Myalgias    Adhesive [Tape] Rash    Patient Measurements: Height: 5\' 10"  (177.8 cm) Weight: 59.2 kg (130 lb 9.6 oz) IBW/kg (Calculated) : 73 Heparin Dosing Weight: 60kg  Vital Signs: Temp: 98 F (36.7 C) (09/16 1559) Temp Source: Oral (09/16 1559) BP: 133/75 (09/16 1559) Pulse Rate: 111 (09/16 1559)  Labs: Recent Labs    04/28/22 1730 04/29/22 0042 04/29/22 0042 04/29/22 0238 04/29/22 0700 04/29/22 1058 04/29/22 1927 04/30/22 0339 04/30/22 1400 04/30/22 1500  HGB 10.5*  --   --   --  11.0*  --   --  11.1*  --   --   HCT 32.1*  --   --   --  33.7*  --   --  32.6*  --   --   PLT 404*  --   --   --  382  --   --  372  --   --   APTT  --  >200*   < > 34  --    < > 37* 49*  --  57*  HEPARINUNFRC  --  >1.10*  --  0.49  --   --   --   --  0.49  --   CREATININE 1.01  --   --   --  0.94  --   --  0.98  --   --    < > = values in this interval not displayed.     Estimated Creatinine Clearance: 57.1 mL/min (by C-G formula based on SCr of 0.98 mg/dL).   Medical History: Past Medical History:  Diagnosis Date   AICD (automatic cardioverter/defibrillator) present 02/11/2020   Anemia    Arthritis    Atrial tachycardia (HCC)    Chronic systolic CHF (congestive heart failure) ([place]) 07/04/2018   CKD (chronic kidney disease), stage III ([place])    Coronary artery disease 02/2012   a. s/p stenting in 2013  b. s/p CABGx2V (SVG--> PDA, SVG--> LCx).   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  Intolerant to PAP therapy   S/P TAVR (transcatheter aortic valve replacement) 05/15/2018   29 mm Edwards Sapien 3 transcatheter heart valve placed via percutaneous right transfemoral approach    Severe aortic stenosis     Medications:  Medications Prior to Admission  Medication Sig Dispense Refill Last Dose   acetaminophen (TYLENOL) 500 MG tablet Take 1,500 mg by mouth daily as needed for mild pain or headache.   04/27/2022   amitriptyline (ELAVIL) 25 MG tablet Take 25 mg by mouth at bedtime.    04/27/2022   B Complex-C (SUPER B COMPLEX PO) Take 1 tablet by  mouth daily.   Past Week   dapagliflozin propanediol (FARXIGA) 10 MG TABS tablet Take 1 tablet (10 mg total) by mouth daily. 90 tablet 3 Past Week   digoxin (LANOXIN) 0.125 MG tablet Take 1 tablet (0.125 mg total) by mouth daily. 30 tablet 3 Past Week   insulin detemir (LEVEMIR) 100 UNIT/ML injection Inject 10-24 Units into the skin See admin instructions. 20-24 units in the morning, 10 units at bedtime   Past Week   insulin lispro (HUMALOG) 100 UNIT/ML KwikPen Inject 0-4 Units into the skin See admin instructions. Per sliding scale at Breakfast and Dinner - pt could not tell me his sliding scale.   Past Week   levothyroxine (SYNTHROID, LEVOTHROID) 50 MCG tablet Take 50 mcg by mouth daily before breakfast.    Past Week   losartan (COZAAR) 25 MG tablet TAKE 1 TABLET(25 MG) BY MOUTH DAILY (Patient taking differently: Take 25 mg by mouth daily.) 90 tablet 3 Past Week   metFORMIN (GLUCOPHAGE) 500 MG tablet Take 2 tablets (1,000 mg total) by mouth 2 (two) times daily with a meal.   Past Week   metoprolol succinate (TOPROL-XL) 100 MG 24 hr  tablet Take 0.5 tablets (50 mg total) by mouth daily. Take with or immediately following a meal. (Patient taking differently: Take 50 mg by mouth in the morning.)   04/26/2022 at 08:00   potassium chloride SA (KLOR-CON M) 20 MEQ tablet Take 1 tablet (20 mEq total) by mouth daily. 180 tablet 3 Past Week   rivaroxaban (XARELTO) 20 MG TABS tablet Take 1 tablet (20 mg total) by mouth daily with supper. (Patient taking differently: Take 20 mg by mouth daily.) 28 tablet 0 04/26/2022 at 08:00   spironolactone (ALDACTONE) 25 MG tablet TAKE 1 TABLET(25 MG) BY MOUTH DAILY (Patient taking differently: Take 25 mg by mouth daily.) 30 tablet 3 Past Week   torsemide (DEMADEX) 20 MG tablet Take 2 tablets (40 mg total) by mouth daily. 180 tablet 3 Past Week   zolpidem (AMBIEN) 5 MG tablet Take 5 mg by mouth at bedtime as needed for sleep.   04/27/2022   simvastatin (ZOCOR) 20 MG tablet Take 20 mg by mouth every evening.  (Patient not taking: Reported on 04/28/2022)   Not Taking   Infusions:   sodium chloride     sodium chloride     heparin 1,600 Units/hr (04/30/22 1108)   milrinone 0.25 mcg/kg/min (04/30/22 0542)    Assessment: Pharmacy consulted to dose heparin for h/o Afib,  while PTA Xarelto is on hold. Last Xarelto dose charted as 9/12 PM. Pt is s/p RHC today. Pt is being worked up for VAD. CBC is stable. Will continue to follow coag plans.   aPTT remains low at 57.  No known issues with IV infusion and no overt bleeding or complications noted per discussion with RN at this time.  Goal of Therapy:  Heparin level 0.3-0.7 units/ml aPTT 66-102 seconds Monitor platelets by anticoagulation protocol: Yes   Plan:  Increase heparin to 1750 units/hr Recheck aPTT in 6h Daily aPTT, heparin level and CBC.  Thank you for allowing pharmacy to participate in this patient's care.  Nicole Cella, RPh Clinical Pharmacist Please see AMION for all Pharmacists' Contact Phone Numbers 04/30/2022, 5:14 PM

## 2022-04-30 NOTE — Progress Notes (Signed)
   Palliative Medicine Inpatient Follow Up Note   Palliative care has acknowledged the consultation for [name].  Plan to meet with patient and his son [name] tomorrow at [employer].  No Charge ______________________________________________________________________________________ [place] Team Team Cell Phone: [phone] Please utilize secure chat with additional questions, if there is no response within 30 minutes please call the above phone number  Palliative Medicine Team providers are available by phone from 7am to 7pm daily and can be reached through the team cell phone.  Should this patient require assistance outside of these hours, please call the patient's attending physician.

## 2022-04-30 NOTE — Progress Notes (Signed)
ANTICOAGULATION CONSULT NOTE  Pharmacy Consult for heparin Indication: atrial fibrillation  Allergies  Allergen Reactions   Entresto [Sacubitril-Valsartan] Other (See Comments)    Hypotension    Lipitor [Atorvastatin] Other (See Comments)    Myalgias    Adhesive [Tape] Rash    Patient Measurements: Height: 5\' 10"  (177.8 cm) Weight: 60 kg (132 lb 4.4 oz) IBW/kg (Calculated) : 73 Heparin Dosing Weight: 60kg  Vital Signs: Temp: 98.3 F (36.8 C) (09/15 2003) Temp Source: Oral (09/15 2003) BP: 101/70 (09/16 0007) Pulse Rate: 122 (09/15 2319)  Labs: Recent Labs    04/28/22 1730 04/29/22 0042 04/29/22 0042 04/29/22 0238 04/29/22 0700 04/29/22 1058 04/29/22 1927 04/30/22 0339  HGB 10.5*  --   --   --  11.0*  --   --  11.1*  HCT 32.1*  --   --   --  33.7*  --   --  32.6*  PLT 404*  --   --   --  382  --   --  372  APTT  --  >200*   < > 34  --  38* 37* 49*  HEPARINUNFRC  --  >1.10*  --  0.49  --   --   --   --   CREATININE 1.01  --   --   --  0.94  --   --  0.98   < > = values in this interval not displayed.     Estimated Creatinine Clearance: 57.8 mL/min (by C-G formula based on SCr of 0.98 mg/dL).   Medical History: Past Medical History:  Diagnosis Date   AICD (automatic cardioverter/defibrillator) present 02/11/2020   Anemia    Arthritis    Atrial tachycardia (HCC)    Chronic systolic CHF (congestive heart failure) ([place]) 07/04/2018   CKD (chronic kidney disease), stage III ([place])    Coronary artery disease 02/2012   a. s/p stenting in 2013  b. s/p CABGx2V (SVG--> PDA, SVG--> LCx).   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  Intolerant to PAP therapy   S/P TAVR (transcatheter aortic valve replacement) 05/15/2018   29 mm Edwards Sapien 3 transcatheter heart valve placed via percutaneous right transfemoral approach    Severe aortic stenosis     Medications:  Medications Prior to Admission  Medication Sig Dispense Refill Last Dose   acetaminophen (TYLENOL) 500 MG tablet Take 1,500 mg by mouth daily as needed for mild pain or headache.   04/27/2022   amitriptyline (ELAVIL) 25 MG tablet Take 25 mg by mouth at bedtime.    04/27/2022   B Complex-C (SUPER B COMPLEX PO) Take 1 tablet by mouth daily.   Past Week   dapagliflozin propanediol (FARXIGA) 10 MG TABS tablet Take 1 tablet (10 mg total) by mouth daily. 90 tablet 3 Past Week   digoxin (LANOXIN) 0.125 MG tablet Take 1 tablet (  0.125 mg total) by mouth daily. 30 tablet 3 Past Week   insulin detemir (LEVEMIR) 100 UNIT/ML injection Inject 10-24 Units into the skin See admin instructions. 20-24 units in the morning, 10 units at bedtime   Past Week   insulin lispro (HUMALOG) 100 UNIT/ML KwikPen Inject 0-4 Units into the skin See admin instructions. Per sliding scale at Breakfast and Dinner - pt could not tell me his sliding scale.   Past Week   levothyroxine (SYNTHROID, LEVOTHROID) 50 MCG tablet Take 50 mcg by mouth daily before breakfast.    Past Week   losartan (COZAAR) 25 MG tablet TAKE 1 TABLET(25 MG) BY MOUTH DAILY (Patient taking differently: Take 25 mg by mouth daily.) 90 tablet 3 Past Week   metFORMIN (GLUCOPHAGE) 500 MG tablet Take 2 tablets (1,000 mg total) by mouth 2 (two) times daily with a meal.   Past Week   metoprolol succinate (TOPROL-XL) 100 MG 24 hr tablet Take 0.5 tablets (50 mg total) by mouth daily. Take with or immediately following a  meal. (Patient taking differently: Take 50 mg by mouth in the morning.)   04/26/2022 at 08:00   potassium chloride SA (KLOR-CON M) 20 MEQ tablet Take 1 tablet (20 mEq total) by mouth daily. 180 tablet 3 Past Week   rivaroxaban (XARELTO) 20 MG TABS tablet Take 1 tablet (20 mg total) by mouth daily with supper. (Patient taking differently: Take 20 mg by mouth daily.) 28 tablet 0 04/26/2022 at 08:00   spironolactone (ALDACTONE) 25 MG tablet TAKE 1 TABLET(25 MG) BY MOUTH DAILY (Patient taking differently: Take 25 mg by mouth daily.) 30 tablet 3 Past Week   torsemide (DEMADEX) 20 MG tablet Take 2 tablets (40 mg total) by mouth daily. 180 tablet 3 Past Week   zolpidem (AMBIEN) 5 MG tablet Take 5 mg by mouth at bedtime as needed for sleep.   04/27/2022   simvastatin (ZOCOR) 20 MG tablet Take 20 mg by mouth every evening.  (Patient not taking: Reported on 04/28/2022)   Not Taking   Infusions:   sodium chloride     sodium chloride     heparin 1,450 Units/hr (04/29/22 2152)   milrinone 0.25 mcg/kg/min (04/28/22 1937)    Assessment: Pharmacy consulted to dose heparin while PTA Xarelto is on hold. Last Xarelto dose charted as 9/12 PM. Pt is s/p RHC today. Pt is being worked up for VAD. CBC is stable. Will continue to follow coag plans.   aPTT remains low at 49.  No known issues with IV infusion.  No overt bleeding or complications noted.  Goal of Therapy:  Heparin level 0.3-0.7 units/ml aPTT 66-102 seconds Monitor platelets by anticoagulation protocol: Yes   Plan:  Increase heparin to 1600 units/hr Recheck aPTT in 6h Daily aPTT, heparin level and CBC.  Thank you for allowing pharmacy to participate in this patient's care.  Alanda Slim, PharmD, [facility] Clinical Pharmacist Please see AMION for all Pharmacists' Contact Phone Numbers 04/30/2022, 5:17 AM

## 2022-04-30 NOTE — Progress Notes (Signed)
Advanced Heart Failure Rounding Note  PCP-Cardiologist: Fransico Him, MD   Subjective:    On Milrinone 0.25 mcg/kg/min. Co-ox 58%. CVP 6-7 . Off diuretics.   Still with occasional back pain. Breathing much better. No SOB, orthopnea or PND.   Chest CT 1. Significant interval enlargement of bulky, likely necrotic left hilar mass/lymphadenopathy. 2. Multiple new pulmonary parenchymal and pleural masses and nodules throughout the left lung, consistent with worsened pulmonary and pleural metastatic disease.  R/LHC 9/14  RA 12 PA 61/26 (39) PCWP 20 (v 27) Fick CO/CI 3.1/1.7 PVR 6.1 PAPi 2.9 PA sat 45%, 47% 3 v CAD with chronically occluded RCA and LCX. Patent SVGs to R PDA and OM. 90% p and 95% d LAD.     Objective:   Weight Range: 59.2 kg Body mass index is 18.74 kg/m.   Vital Signs:   Temp:  [97.6 F (36.4 C)-98.3 F (36.8 C)] 98.1 F (36.7 C) (09/16 0734) Pulse Rate:  [121-123] 121 (09/16 0734) Resp:  [16-20] 18 (09/16 0734) BP: (95-105)/(64-75) 95/64 (09/16 0734) SpO2:  [97 %-99 %] 98 % (09/16 0734) Weight:  [59.2 kg] 59.2 kg (09/16 0456) Last BM Date : 04/29/22  Weight change: Filed Weights   04/28/22 1208 04/29/22 0203 04/30/22 0456  Weight: 61.6 kg 60 kg 59.2 kg    Intake/Output:   Intake/Output Summary (Last 24 hours) at 04/30/2022 1221 Last data filed at 04/30/2022 0900 Gross per 24 hour  Intake 1224.78 ml  Output 2650 ml  Net -1425.22 ml       Physical Exam    CVP 7 General:  Well appearing. No resp difficulty HEENT: Normal Neck: Supple. JVP 7-8 cm . Carotids 2+ bilat; no bruits. No lymphadenopathy or thyromegaly appreciated. Cor: PMI nondisplaced. Regular rhythm, tachy rate. No rubs, gallops or murmurs. Lungs: Clear Abdomen: Soft, nontender, nondistended. No hepatosplenomegaly. No bruits or masses. Good bowel sounds. Extremities: No cyanosis, clubbing, rash, edema + RUE PICC  Neuro: Alert & orientedx3, cranial nerves grossly intact.  moves all 4 extremities w/o difficulty. Affect pleasant   Telemetry   Sinus tach 110s-120s   EKG    Sinus tach 116 bpm, LBBB   Labs    CBC Recent Labs    04/28/22 1730 04/29/22 0700 04/30/22 0339  WBC 7.8 7.1 8.0  NEUTROABS 6.0  --   --   HGB 10.5* 11.0* 11.1*  HCT 32.1* 33.7* 32.6*  MCV 71.3* 71.1* 69.1*  PLT 404* 382 829    Basic Metabolic Panel Recent Labs    04/28/22 1730 04/29/22 0700 04/30/22 0339  NA 136 136 134*  K 4.3 3.8 3.7  CL 104 97* 95*  CO2 22 26 26   GLUCOSE 438* 238* 158*  BUN 27* 26* 27*  CREATININE 1.01 0.94 0.98  CALCIUM 8.2* 8.6* 8.9  MG 1.8  --   --     Liver Function Tests Recent Labs    04/28/22 1730  AST 14*  ALT 10  ALKPHOS 82  BILITOT 0.6  PROT 6.5  ALBUMIN 2.4*    No results for input(s): "LIPASE", "AMYLASE" in the last 72 hours. Cardiac Enzymes No results for input(s): "CKTOTAL", "CKMB", "CKMBINDEX", "TROPONINI" in the last 72 hours.  BNP: BNP (last 3 results) Recent Labs    03/08/22 1114 03/30/22 1028 04/27/22 1251  BNP 1,980.1* 1,880.7* 1,466.6*     ProBNP (last 3 results) No results for input(s): "PROBNP" in the last 8760 hours.   D-Dimer No results for input(s): "DDIMER" in  the last 72 hours. Hemoglobin A1C Recent Labs    04/29/22 0700  HGBA1C 9.6*    Fasting Lipid Panel Recent Labs    04/29/22 0700  CHOL 119  HDL 39*  LDLCALC 65  TRIG 76  CHOLHDL 3.1    Thyroid Function Tests Recent Labs    04/29/22 0700  TSH 4.100     Other results:   Imaging    VAS [place] ABI WITH/WO TBI  Result Date: 04/30/2022  LOWER EXTREMITY DOPPLER STUDY Patient Name:  [name]  Date of Exam:   04/29/2022 Medical Rec #: [id number]      Accession #:    [id number] Date of Birth: 1951-02-02     Patient Gender: M Patient Age:   71 years Exam Location:  [facility] Procedure:      VAS [place] ABI WITH/WO TBI Referring Phys: Quillian Quince Janiyha Montufar  --------------------------------------------------------------------------------  Indications: Pre-VAD evaluation High Risk Factors: Hypertension, hyperlipidemia, Diabetes, past history of                    smoking, coronary artery disease, prior CVA. Other Factors: CHF, CKD, Afib, S/P TAVR.  Comparison Study: Previous exam on 12/29/12 showed non compressible ABIs Performing Technologist: Rogelia Rohrer RVT, RDMS  Examination Guidelines: A complete evaluation includes at minimum, Doppler waveform signals and systolic blood pressure reading at the level of bilateral brachial, anterior tibial, and posterior tibial arteries, when vessel segments are accessible. Bilateral testing is considered an integral part of a complete examination. Photoelectric Plethysmograph (PPG) waveforms and toe systolic pressure readings are included as required and additional duplex testing as needed. Limited examinations for reoccurring indications may be performed as noted.  ABI Findings: +---------+------------------+-----+----------+-------------------------------+ Right    Rt Pressure (mmHg)IndexWaveform  Comment                         +---------+------------------+-----+----------+-------------------------------+ Brachial                        triphasic                                 +---------+------------------+-----+----------+-------------------------------+ PTA      195               1.61 biphasic                                  +---------+------------------+-----+----------+-------------------------------+ DP       127               1.05 monophasic                                +---------+------------------+-----+----------+-------------------------------+ Great Toe109               0.90 Abnormal  normal value, abnormal waveform +---------+------------------+-----+----------+-------------------------------+ +---------+------------------+-----+----------+--------------------------------+ Left      Lt Pressure (mmHg)IndexWaveform  Comment                          +---------+------------------+-----+----------+--------------------------------+ Brachial 121                    triphasic                                  +---------+------------------+-----+----------+--------------------------------+  PTA                             monophasic                                 +---------+------------------+-----+----------+--------------------------------+ DP       208               1.72 biphasic                                   +---------+------------------+-----+----------+--------------------------------+ Great Toe85                0.70 Abnormal  normal value, abnormal waveforms +---------+------------------+-----+----------+--------------------------------+  Arterial wall calcification precludes accurate ankle pressures and ABIs.  Summary: Right: Resting right ankle-brachial index indicates noncompressible right lower extremity arteries. The right toe-brachial index is normal. Left: Resting left ankle-brachial index indicates noncompressible left lower extremity arteries. The left toe-brachial index is normal. *See table(s) above for measurements and observations.  Electronically signed by Servando Snare MD on 04/30/2022 at 10:40:11 AM.    Final    VAS [place] LOWER EXTREMITY VENOUS (DVT)  Result Date: 04/30/2022  Lower Venous DVT Study Patient Name:  AKSHATH MCCAREY  Date of Exam:   04/29/2022 Medical Rec #: [id number]      Accession #:    [id number] Date of Birth: 08-10-1951     Patient Gender: M Patient Age:   50 years Exam Location:  [facility] Procedure:      VAS [place] LOWER EXTREMITY VENOUS (DVT) Referring Phys: Quillian Quince Mychaela Lennartz --------------------------------------------------------------------------------  Indications: Pre-VAD evaluation.  Comparison Study: No prior studies. Performing Technologist: Darlin Coco RDMS, RVT  Examination Guidelines: A complete evaluation includes  B-mode imaging, spectral Doppler, color Doppler, and power Doppler as needed of all accessible portions of each vessel. Bilateral testing is considered an integral part of a complete examination. Limited examinations for reoccurring indications may be performed as noted. The reflux portion of the exam is performed with the patient in reverse Trendelenburg.  +---------+---------------+---------+-----------+----------+--------------+ RIGHT    CompressibilityPhasicitySpontaneityPropertiesThrombus Aging +---------+---------------+---------+-----------+----------+--------------+ CFV      Full           No       Yes                                 +---------+---------------+---------+-----------+----------+--------------+ SFJ      Full                                                        +---------+---------------+---------+-----------+----------+--------------+ FV Prox  Full                                                        +---------+---------------+---------+-----------+----------+--------------+ FV Mid   Full                                                        +---------+---------------+---------+-----------+----------+--------------+  FV DistalFull                                                        +---------+---------------+---------+-----------+----------+--------------+ PFV      Full                                                        +---------+---------------+---------+-----------+----------+--------------+ POP      Full           No       Yes                                 +---------+---------------+---------+-----------+----------+--------------+ PTV      Full                                                        +---------+---------------+---------+-----------+----------+--------------+ PERO     Full                                                         +---------+---------------+---------+-----------+----------+--------------+   +---------+---------------+---------+-----------+----------+--------------+ LEFT     CompressibilityPhasicitySpontaneityPropertiesThrombus Aging +---------+---------------+---------+-----------+----------+--------------+ CFV      Full           No       Yes                                 +---------+---------------+---------+-----------+----------+--------------+ SFJ      Full                                                        +---------+---------------+---------+-----------+----------+--------------+ FV Prox  Full                                                        +---------+---------------+---------+-----------+----------+--------------+ FV Mid   Full                                                        +---------+---------------+---------+-----------+----------+--------------+ FV DistalFull                                                        +---------+---------------+---------+-----------+----------+--------------+  PFV      Full                                                        +---------+---------------+---------+-----------+----------+--------------+ POP      Full           No       Yes                                 +---------+---------------+---------+-----------+----------+--------------+ PTV      Full                                                        +---------+---------------+---------+-----------+----------+--------------+ PERO     Full                                                        +---------+---------------+---------+-----------+----------+--------------+     Summary: RIGHT: - There is no evidence of deep vein thrombosis in the lower extremity.  - No cystic structure found in the popliteal fossa.  LEFT: - There is no evidence of deep vein thrombosis in the lower extremity.  - No cystic structure found in the popliteal fossa.   *See table(s) above for measurements and observations. Electronically signed by Servando Snare MD on 04/30/2022 at 10:39:57 AM.    Final    CT CHEST ABDOMEN PELVIS W CONTRAST  Result Date: 04/29/2022 CLINICAL DATA:  LVAD workup, known lung cancer * Tracking Code: BO * EXAM: CT CHEST, ABDOMEN, AND PELVIS WITH CONTRAST TECHNIQUE: Multidetector CT imaging of the chest, abdomen and pelvis was performed following the standard protocol during bolus administration of intravenous contrast. RADIATION DOSE REDUCTION: This exam was performed according to the departmental dose-optimization program which includes automated exposure control, adjustment of the mA and/or kV according to patient size and/or use of iterative reconstruction technique. CONTRAST:  37mL OMNIPAQUE IOHEXOL 350 MG/ML SOLN additional oral enteric contrast COMPARISON:  CT chest, 11/15/2021 FINDINGS: CT CHEST FINDINGS Cardiovascular: Aortic atherosclerosis. Aortic valve stent endograft. Left chest multi lead pacer. Cardiomegaly. Three-vessel coronary artery calcifications and stents. No pericardial effusion. Mediastinum/Nodes: Significant interval enlargement of bulky, likely necrotic left hilar mass/lymphadenopathy, measuring approximately 8.2 x 4.7 cm, previously 6.1 x 3.8 cm when measured similarly on noncontrast examination (series 3, image 29). Thyroid gland, trachea, and esophagus demonstrate no significant findings. Lungs/Pleura: Multiple new pulmonary parenchymal and pleural masses and nodules throughout the left lung, index mass of the posterior left upper lobe measuring 3.5 x 2.9 cm (series 5, image 46), index pleural mass about the dependent left lower lobe measuring 5.7 x 4.0 cm (series 5, image 84). Unchanged small nodule of the peripheral inferior right upper lobe measuring 0.7 cm (series 5, image 77). Increased, loculated small left pleural effusion. Musculoskeletal: No chest wall abnormality. No acute osseous findings. CT ABDOMEN PELVIS  FINDINGS Hepatobiliary: No solid liver abnormality is seen. Small somewhat ill-defined hyperenhancement in the right lobe of  the liver measuring 1.2 x 0.9 cm (series 3, image 60). No gallstones, gallbladder wall thickening, or biliary dilatation. Pancreas: Unremarkable. No pancreatic ductal dilatation or surrounding inflammatory changes. Spleen: Normal in size without significant abnormality. Adrenals/Urinary Tract: Adrenal glands are unremarkable. Nonobstructive calculus of the inferior pole of the left kidney. No left-sided calculi, ureteral calculi, hydronephrosis. Bladder is unremarkable. Stomach/Bowel: Stomach is within normal limits. Appendix not clearly visualized no evidence of bowel wall thickening, distention, or inflammatory changes. Large burden of stool throughout colon. Vascular/Lymphatic: Aortic atherosclerosis. No enlarged abdominal or pelvic lymph nodes. Reproductive: No mass or other abnormality. Other: No abdominal wall hernia or abnormality. No ascites. Musculoskeletal: No acute osseous findings. IMPRESSION: 1. Significant interval enlargement of bulky, likely necrotic left hilar mass/lymphadenopathy. 2. Multiple new pulmonary parenchymal and pleural masses and nodules throughout the left lung, consistent with worsened pulmonary and pleural metastatic disease. 3. Unchanged small nodule of the peripheral inferior right upper lobe measuring 0.7 cm. 4. Increased, loculated small left pleural effusion. 5. No convincing evidence of metastatic disease within the abdomen or pelvis. 6. Small focus of hyperenhancement of the right lobe of the liver, most likely a flash filling hemangioma. Attention on follow-up. 7. Nonobstructive left nephrolithiasis. 8. Cardiomegaly and coronary artery disease. Aortic Atherosclerosis (ICD10-I70.0). Electronically Signed   By: Delanna Ahmadi M.D.   On: 04/29/2022 16:25     Medications:     Scheduled Medications:  (feeding supplement) PROSource Plus  30 mL Oral BID  BM   amitriptyline  25 mg Oral QHS   Chlorhexidine Gluconate Cloth  6 each Topical Daily   dapagliflozin propanediol  10 mg Oral Daily   digoxin  0.0625 mg Oral Daily   feeding supplement  237 mL Oral TID BM   insulin aspart  0-15 Units Subcutaneous TID WC   insulin detemir  10 Units Subcutaneous Daily   levothyroxine  50 mcg Oral QAC breakfast   losartan  12.5 mg Oral Daily   multivitamin with minerals  1 tablet Oral Daily   simvastatin  20 mg Oral QPM   sodium chloride flush  10-40 mL Intracatheter Q12H   sodium chloride flush  3 mL Intravenous Q12H   sodium chloride flush  3 mL Intravenous Q12H   spironolactone  25 mg Oral Daily    Infusions:  sodium chloride     sodium chloride     heparin 1,600 Units/hr (04/30/22 1108)   milrinone 0.25 mcg/kg/min (04/30/22 0542)    PRN Medications: sodium chloride, sodium chloride, acetaminophen, sodium chloride flush, sodium chloride flush, sodium chloride flush, torsemide, traMADol, zolpidem    Patient Profile   71 y/o male with chronic systolic HF due to ICM with several weeks progressive HF symptoms. Presented 9/14 for outpatient [facility] which demonstrated low cardiac output Fick CO/CI 3.1/1.7. Admitted from cath lab for HF optimization w/ initiation of milrinone and VAD w/u.   Assessment/Plan   Chronic Systolic Heart Failure: - s/p St Jude CRT-D 01/2020. Unable to place LV lead. Interestingly, he had a RBBB during a recent admission and had LBBB on prior ECGs. Discussed with Dr. Lovena Le, with current RBBB a left bundle lead will not be likely to help.  - Had PYP 2020 not suggestive of amyloid.  - Echo (1/23): EF 20-25%, RV significantly reduced, RVSP 50 mmHg, moderate MR, moderate TR, mean gradient 2 mmHg across aortic valve prosthesis. - CPX (3/23): moderate to severe HF limitation. - R/LHC 9/14: RA 12, PA mean 39, PCWP 20, CI 1.7.  -  On milrinone 0.25. Co-ox 58% - CVP 6-7 (checked personally) Holding diuretics currently  -  Continue Farxiga 10 mg daily.  - Continue digoxin 0.125 mg daily. - Continue losartan 12.5 mg daily (failed Entresto in past due to low BP).  - Continue spironolactone 25 mg daily. - off ? blocker due to low output  - Corlanor previously stopped d/t cost.  - Admitted for VAD w/u but CT chest shows progressive metastatic lung CA. Will d/w Thoracic Oncology team. Will also need Palliative involvement - No candidate for VAD   2. CAD - h/o CABG 2013 - LHC this adm: patent SVGs to R PDA and OM. 90% p and 95% d LAD - No s/s angina. Continue medical rx - No ASA with A/C. - Continue simvastatin 20 mg daily. LDL ok at 65 mg/dL    3. Paroxysmal AFib - Remains in NSR but rat up with milrinone - CHA2DS2-VASc score is 5 - He tolerates AFib - Now on heparin in setting of need for procedures  - Continue CPAP   4. DM2 - Insulin dependent. - Hgb A1C 9.6  - On SGLT2i. - Managed by PCP.   5. OSA - Continue CPAP.   6. Aortic valve stenosis - S/P TAVR 2019 with 29 mm S3 - Valve okay on  echo 01/23   7. Hypothyroid - On levothyroxine.    8. 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 Stage IV Lung CA, squamous cell - found to have a 3 cm mass in the superior segment of left lower lobe which was biopsy-proven to be squamous cell carcinoma. - He had some mild mediastinal adenopathy without significant activity on PET scan.   - He was not felt to be candidate for lobectomy due to his severe cardiac disease and underwent SBRT. - chest CT shows worsening metastatic lung CA - I spoke with Dr. Valeta Harms who recommends CT-guided biopsy of L pleural based mass to establish firm tissue diagnosis and help establish options with the understanding that he may not be good candidate for aggressive chemotherapy with his heart disease. - I d/w his son [name] by  phone.   Length of Stay: 2  Glori Bickers, MD  04/30/2022, 12:21 PM  Advanced Heart Failure Team Pager [phone] (M-F; 7a - 5p)  Please contact [facility] for night-coverage after hours (5p -7a ) and weekends on [email or web address]

## 2022-04-30 NOTE — Consult Note (Signed)
Palliative Medicine Inpatient Consult Note Consulting Provider: Jolaine Artist, MD  Reason for consult:   [name] Palliative Medicine Consult  Reason for Consult? LVAD workup   04/30/2022  HPI:  Per intake H&P --> [name] is a 71 y.o. with a history of squamous cell lung cancer LLL 1985, CAD, CABG x2 2013, aortic stenosis s/p TAVR 2019, HFrEF (recent LVEF < 20%), and LBBB, St Jude CRT-D (unable to place LV lead), HTN, OSA. Seen in HF clinic 9/13 for an acute visit - increased dyspnea, fatigue and decline in activity tolerance. Work up in house significant for worsening metastatic lung disease - Stg IV. Palliative care initially involved to discuss and LVAD though no longer a candidate in the setting of mets. Palliative will discuss goals of care and symptom management.   Clinical Assessment/Goals of Care:  *Please note that this is a verbal dictation therefore any spelling or grammatical errors are due to the "[place] [employer]" system interpretation.  [name] have reviewed medical records including EPIC notes, labs and imaging, received report from bedside RN, assessed the patient.    [name] met with [name] to further discuss diagnosis prognosis, GOC, EOL wishes, disposition and options.   [name] introduced Palliative Medicine as specialized medical care for people living with serious illness. It focuses on providing relief from the symptoms and stress of a serious illness. The goal is to improve quality of life for both the patient and the family.  Medical History Review and Understanding:  [name] reviewed his past medical history of squamous cell lung cancer for which she received 30 radiation treatments, he had a recurrence about 3 years ago and got 5 radiation treatments though he was under the assumption that there was great improvement.  Aortic stenosis requiring a TAVR in 2019, heart failure, and coronary artery disease.  Social History:  [name] shares  with me that he is from [place], [place].  [name] has been married twice and is unfortunately going through divorce at the present time.  He was married to his first wife for 17 years with whom he shares to sons and 2 grandchildren.  He was married to his second wife for 27 years though in the setting of their separation she has moved to [place].  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  He is a man of faith and practices within Christianity.  Functional and Nutritional State:  Prior to admission [name] was fully independent of all B ADLs and IADLs.  He vocalizes having a fairly robust appetite as well.  He does share that he has lost roughly 60 pounds over the past 1 year.  Of time.  Advance Directives:  A detailed discussion was had today regarding advanced directives.  Patient acknowledges that his sons are surrogate decision makers.  He does not have a living will at this time  Code Status:  Encouraged patient  to consider DNR/DNI status understanding evidenced based poor outcomes in similar hospitalized patient, as the cause of arrest is likely associated with advanced chronic/terminal illness rather than an easily reversible acute cardio-pulmonary event. [name] explained that DNR/DNI does not change the medical plan and it only comes into effect after a person has arrested (died).  It is a protective measure to keep  [name] from harming the patient in their last moments of life. [name] was agreeable to DNR/DNI with understanding that patient would not receive CPR, defibrillation, ACLS medications, or intubation.   Patient is very clear about his desire to have a natural death.  He shares that he would never want to be a burden on his family.  Discussion:  We discussed patient's complicated hospitalization in  the setting of initially being admitted for which [name] thought was a "heart pump".  To now realizing that he has metastatic disease.  We reviewed that there are still information to acquire prior to decisions moving forward.  We did discuss best case and worst-case scenarios as [name] shares that he would ideally like to continue living if his underlying disease could be treated.  We reviewed that sometimes the treatments of cancer such as chemotherapy and radiation can be burdening to the body and can take a toll on patient's cardiovascular system.  We reviewed that that is sometimes problematic most especially in the setting of an individual who already has such a compromised heart.  We discussed worst-case that no treatments could be offered and patient would need to start considering what he would want in terms of his end-of-life wishes.  [name] discussed that hospice would likely be a consideration at that time.[name] described hospice as a service for patients who have a life expectancy of 6 months or less. The goal of hospice is the preservation of dignity and quality at the end phases of life. Under hospice care, the focus changes from curative to symptom relief.   Both [name] and his sons understand the present situation.  We reviewed that further guidance will be provided as the work-up is completed.  Patient vocalizes knowledge of this and that additional work-up will hopefully guide his possible treatment options and future prognosis.  Discussed the importance of [name] conversation with family and their  medical providers regarding overall plan of care and treatment options, ensuring decisions are within the context of the patients values and GOCs.  Decision Maker: [name],[name] (Son): [phone] ([place])  SUMMARY OF RECOMMENDATIONS   DNAR/DNI  Patient is aware that additional work-up is needed at this time to give a better picture of treatment options and prognosis  This case and worst-case  scenarios were reviewed and Gaylord's present clinical situation  [name] did broach the topic of hospice should the risks of possible future treatment outweigh the benefits  Palliative care will continue to follow along with [name] throughout his hospitalization  Code Status/Advance Care Planning: DNAR/DNI  Palliative Prophylaxis:  Aspiration, Bowel Regimen, Delirium Protocol, Frequent Pain Assessment, Oral Care, Palliative Wound Care, and Turn Reposition  Additional Recommendations (Limitations, Scope, Preferences): Continue current care  Psycho-social/Spiritual:  Desire for further Chaplaincy support: Not presently Additional Recommendations: Education on chronic disease burden and recurrence of squamous cell carcinoma   Prognosis: Unclear at this time as additional work-up is pending  Discharge Planning: To be determined in the oncoming days  Vitals:   04/30/22 0456 04/30/22 0734  BP: 103/71 95/64  Pulse:  (!) 121  Resp: 17 18  Temp: 98.1 F (36.7 C) 98.1 F (36.7 C)  SpO2:  98%    Intake/Output Summary (Last 24 hours) at 04/30/2022 1329 Last data filed at 04/30/2022 0900 Gross per 24 hour  Intake 1224.78 ml  Output 2250 ml  Net -1025.22 ml   Last Weight  Most recent update: 04/30/2022  5:02 AM    Weight  59.2 kg (130 lb 9.6  oz)            Gen: Elderly African-American male in no acute distress HEENT: moist mucous membranes CV: Irregular rate and regular rhythm  PULM: On room air breathing is even and unlabored ABD: soft/nontender EXT: Muscle wasting Neuro: Alert and oriented x3  PPS: 60%   This conversation/these recommendations were discussed with patient primary care team, Dr. Haroldine Laws  Billing based on MDM: High  Problems Addressed: One acute or chronic illness or injury that poses a threat to life or bodily function  Amount and/or Complexity of Data: Category 3:Discussion of management or test interpretation with external physician/other qualified health  care professional/appropriate source (not separately reported)  Risks: Decision not to resuscitate or to de-escalate care because of poor prognosis ______________________________________________________ [place] Team Team Cell Phone: [phone] Please utilize secure chat with additional questions, if there is no response within 30 minutes please call the above phone number  Palliative Medicine Team providers are available by phone from 7am to 7pm daily and can be reached through the team cell phone.  Should this patient require assistance outside of these hours, please call the patient's attending physician.

## 2022-05-01 DIAGNOSIS — Z7189 Other specified counseling: Secondary | ICD-10-CM | POA: Diagnosis not present

## 2022-05-01 DIAGNOSIS — Z66 Do not resuscitate: Secondary | ICD-10-CM

## 2022-05-01 DIAGNOSIS — I5023 Acute on chronic systolic (congestive) heart failure: Secondary | ICD-10-CM

## 2022-05-01 DIAGNOSIS — Z515 Encounter for palliative care: Secondary | ICD-10-CM | POA: Diagnosis not present

## 2022-05-01 LAB — BASIC METABOLIC PANEL
Anion gap: 11 (ref 5–15)
BUN: 27 mg/dL — ABNORMAL HIGH (ref 8–23)
CO2: 25 mmol/L (ref 22–32)
Calcium: 9.2 mg/dL (ref 8.9–10.3)
Chloride: 98 mmol/L (ref 98–111)
Creatinine, Ser: 0.84 mg/dL (ref 0.61–1.24)
GFR, Estimated: >60 mL/min (ref >60)
Glucose, Bld: 189 mg/dL — ABNORMAL HIGH (ref 70–99)
Potassium: 4.1 mmol/L (ref 3.5–5.1)
Sodium: 134 mmol/L — ABNORMAL LOW (ref 135–145)

## 2022-05-01 LAB — CBC
HCT: 31.9 % — ABNORMAL LOW (ref 39.0–52.0)
Hemoglobin: 10.6 g/dL — ABNORMAL LOW (ref 13.0–17.0)
MCH: 23.4 pg — ABNORMAL LOW (ref 26.0–34.0)
MCHC: 33.2 g/dL (ref 30.0–36.0)
MCV: 70.4 fL — ABNORMAL LOW (ref 80.0–100.0)
Platelets: 379 10*3/uL (ref 150–400)
RBC: 4.53 MIL/uL (ref 4.22–5.81)
RDW: 17.9 % — ABNORMAL HIGH (ref 11.5–15.5)
WBC: 7.7 10*3/uL (ref 4.0–10.5)
nRBC: 0 % (ref 0.0–0.2)

## 2022-05-01 LAB — PSA, TOTAL AND FREE
PSA, Free Pct: 30 %
PSA, Free: 0.12 ng/mL
Prostate Specific Ag, Serum: 0.4 ng/mL (ref 0.0–4.0)

## 2022-05-01 LAB — APTT
aPTT: 59 seconds — ABNORMAL HIGH (ref 24–36)
aPTT: 47 seconds — ABNORMAL HIGH (ref 24–36)
aPTT: 60 seconds — ABNORMAL HIGH (ref 24–36)
aPTT: 49 seconds — ABNORMAL HIGH (ref 24–36)

## 2022-05-01 LAB — GLUCOSE, CAPILLARY
Glucose-Capillary: 216 mg/dL — ABNORMAL HIGH (ref 70–99)
Glucose-Capillary: 296 mg/dL — ABNORMAL HIGH (ref 70–99)
Glucose-Capillary: 434 mg/dL — ABNORMAL HIGH (ref 70–99)
Glucose-Capillary: 416 mg/dL — ABNORMAL HIGH (ref 70–99)
Glucose-Capillary: 231 mg/dL — ABNORMAL HIGH (ref 70–99)
Glucose-Capillary: 191 mg/dL — ABNORMAL HIGH (ref 70–99)

## 2022-05-01 LAB — COOXEMETRY PANEL
Carboxyhemoglobin: 0.9 % (ref 0.5–1.5)
Methemoglobin: <0.7 % (ref 0.0–1.5)
O2 Saturation: 50.2 %
Total hemoglobin: 10.1 g/dL — ABNORMAL LOW (ref 12.0–16.0)

## 2022-05-01 LAB — HEPARIN LEVEL (UNFRACTIONATED): Heparin Unfractionated: 0.34 IU/mL (ref 0.30–0.70)

## 2022-05-01 MED ORDER — AMIODARONE LOAD VIA INFUSION
150.0000 mg | Freq: Once | INTRAVENOUS | Status: AC
  Administered 2022-05-01: 150 mg via INTRAVENOUS
  Filled 2022-05-01: qty 83.34

## 2022-05-01 MED ORDER — TORSEMIDE 20 MG PO TABS
40.0000 mg | ORAL_TABLET | Freq: Once | ORAL | Status: AC
  Administered 2022-05-01: 40 mg via ORAL
  Filled 2022-05-01: qty 2

## 2022-05-01 MED ORDER — AMIODARONE HCL IN DEXTROSE 360-4.14 MG/200ML-% IV SOLN
60.0000 mg/h | INTRAVENOUS | Status: AC
  Administered 2022-05-01 (×2): 60 mg/h via INTRAVENOUS
  Filled 2022-05-01: qty 200

## 2022-05-01 MED ORDER — AMIODARONE HCL IN DEXTROSE 360-4.14 MG/200ML-% IV SOLN
30.0000 mg/h | INTRAVENOUS | Status: DC
  Administered 2022-05-01 – 2022-05-02 (×2): 30 mg/h via INTRAVENOUS
  Filled 2022-05-01 (×3): qty 200

## 2022-05-01 MED ORDER — INSULIN ASPART 100 UNIT/ML IJ SOLN
20.0000 [IU] | Freq: Once | INTRAMUSCULAR | Status: AC
  Administered 2022-05-01: 20 [IU] via SUBCUTANEOUS

## 2022-05-01 MED ORDER — IVABRADINE HCL 5 MG PO TABS
2.5000 mg | ORAL_TABLET | Freq: Two times a day (BID) | ORAL | Status: DC
  Administered 2022-05-01: 2.5 mg via ORAL
  Filled 2022-05-01: qty 1

## 2022-05-01 NOTE — Progress Notes (Deleted)
  Request seen for lung biopsy.  Per chart, Dr. Haroldine Laws spoke with Dr. Valeta Harms and he requested biopsy of the left pleural based mass seen on CT. (I do not see a pulmonary consult note)  Images were reviewed by Dr. Kathlene Cote.  Ideally patient should have PET scan.   He feels the LLL mass can be biopsied via bronchoscopy. Either way, patient is high risk for either procedure.  Melat Wrisley S Lincoln Kleiner PA-C 05/01/2022 8:40 AM

## 2022-05-01 NOTE — Progress Notes (Signed)
Pt's blood sugar was 434 at 15:36.  20 units of Novolog was given at 16:08.  Pt's blood sugar was 416 at 17:24 Pt said that he takes sliding scale humalog and Levemir 20-25 units twice a day at home.   Notified cardiology

## 2022-05-01 NOTE — Progress Notes (Signed)
ANTICOAGULATION CONSULT NOTE  Pharmacy Consult for heparin Indication: atrial fibrillation  Allergies  Allergen Reactions   Entresto [Sacubitril-Valsartan] Other (See Comments)    Hypotension    Lipitor [Atorvastatin] Other (See Comments)    Myalgias    Adhesive [Tape] Rash    Patient Measurements: Height: 5\' 10"  (177.8 cm) Weight: 60.5 kg (133 lb 4.8 oz) IBW/kg (Calculated) : 73 Heparin Dosing Weight: 60kg  Vital Signs: Temp: 97.6 F (36.4 C) (09/17 0733) Temp Source: Oral (09/17 0733) BP: 114/77 (09/17 1123) Pulse Rate: 127 (09/17 1123)  Labs: Recent Labs    04/29/22 0238 04/29/22 0700 04/29/22 1058 04/30/22 0339 04/30/22 1400 04/30/22 1500 05/01/22 0000 05/01/22 0235 05/01/22 1224  HGB  --  11.0*  --  11.1*  --   --   --  10.6*  --   HCT  --  33.7*  --  32.6*  --   --   --  31.9*  --   PLT  --  382  --  372  --   --   --  379  --   APTT 34  --    < > 49*  --    < > 59* 47* 60*  HEPARINUNFRC 0.49  --   --   --  0.49  --   --  0.34  --   CREATININE  --  0.94  --  0.98  --   --   --  0.84  --    < > = values in this interval not displayed.    Estimated Creatinine Clearance: 68 mL/min (by C-G formula based on SCr of 0.84 mg/dL).   Medical History: Past Medical History:  Diagnosis Date   AICD (automatic cardioverter/defibrillator) present 02/11/2020   Anemia    Arthritis    Atrial tachycardia (HCC)    Chronic systolic CHF (congestive heart failure) ([place]) 07/04/2018   CKD (chronic kidney disease), stage III ([place])    Coronary artery disease 02/2012   a. s/p stenting in 2013  b. s/p CABGx2V (SVG--> PDA, SVG--> LCx).   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  Intolerant to PAP therapy   S/P TAVR (transcatheter aortic valve replacement) 05/15/2018   29 mm Edwards Sapien 3 transcatheter heart valve placed via percutaneous right transfemoral approach    Severe aortic stenosis     Medications:  Medications Prior to Admission  Medication Sig Dispense Refill Last Dose   acetaminophen (TYLENOL) 500 MG tablet Take 1,500 mg by mouth daily as needed for mild pain or headache.   04/27/2022   amitriptyline (ELAVIL) 25 MG tablet Take 25 mg by mouth at bedtime.    04/27/2022   B Complex-C (SUPER B COMPLEX PO) Take 1 tablet by mouth daily.   Past Week   dapagliflozin propanediol (FARXIGA) 10 MG TABS tablet Take 1 tablet (10 mg total)  by mouth daily. 90 tablet 3 Past Week   digoxin (LANOXIN) 0.125 MG tablet Take 1 tablet (0.125 mg total) by mouth daily. 30 tablet 3 Past Week   insulin detemir (LEVEMIR) 100 UNIT/ML injection Inject 10-24 Units into the skin See admin instructions. 20-24 units in the morning, 10 units at bedtime   Past Week   insulin lispro (HUMALOG) 100 UNIT/ML KwikPen Inject 0-4 Units into the skin See admin instructions. Per sliding scale at Breakfast and Dinner - pt could not tell me his sliding scale.   Past Week   levothyroxine (SYNTHROID, LEVOTHROID) 50 MCG tablet Take 50 mcg by mouth daily before breakfast.    Past Week   losartan (COZAAR) 25 MG tablet TAKE 1 TABLET(25 MG) BY MOUTH DAILY (Patient taking differently: Take 25 mg by mouth daily.) 90 tablet 3 Past Week   metFORMIN (GLUCOPHAGE) 500 MG tablet Take 2 tablets (1,000 mg total) by mouth 2 (two) times daily with a meal.   Past Week   metoprolol succinate (TOPROL-XL) 100 MG 24 hr tablet Take 0.5 tablets (50 mg total) by mouth  daily. Take with or immediately following a meal. (Patient taking differently: Take 50 mg by mouth in the morning.)   04/26/2022 at 08:00   potassium chloride SA (KLOR-CON M) 20 MEQ tablet Take 1 tablet (20 mEq total) by mouth daily. 180 tablet 3 Past Week   rivaroxaban (XARELTO) 20 MG TABS tablet Take 1 tablet (20 mg total) by mouth daily with supper. (Patient taking differently: Take 20 mg by mouth daily.) 28 tablet 0 04/26/2022 at 08:00   spironolactone (ALDACTONE) 25 MG tablet TAKE 1 TABLET(25 MG) BY MOUTH DAILY (Patient taking differently: Take 25 mg by mouth daily.) 30 tablet 3 Past Week   torsemide (DEMADEX) 20 MG tablet Take 2 tablets (40 mg total) by mouth daily. 180 tablet 3 Past Week   zolpidem (AMBIEN) 5 MG tablet Take 5 mg by mouth at bedtime as needed for sleep.   04/27/2022   simvastatin (ZOCOR) 20 MG tablet Take 20 mg by mouth every evening.  (Patient not taking: Reported on 04/28/2022)   Not Taking   Infusions:   sodium chloride     sodium chloride     heparin 1,850 Units/hr (05/01/22 0440)   milrinone 0.125 mcg/kg/min (05/01/22 1300)    Assessment: Pharmacy consulted to dose heparin for h/o Afib, while PTA Xarelto is on hold. Last Xarelto dose charted as 9/12 PM. Pt is s/p RHC. CBC is stable.   Patient was found to have large mass with bulky necrotic hilar adenopathy. Patient is now DNR/DNI. Remains NSR but rate is up with milrinone. Will continue to follow coag plans.   aPTT 60 on drip rate 1850 units/hr subtherapeutic after rate increase. Last level 47 subtherapeutic as well. Hgb 10.6 and plt 379. No s/sx bleeding noted in chart.    Goal of Therapy:  Heparin level 0.3-0.7 units/ml aPTT 66-102 seconds Monitor platelets by anticoagulation protocol: Yes   Plan:  Increase heparin to 1950 units/hr Recheck aPTT in 6h Daily aPTT, heparin level and CBC   Thank you for allowing pharmacy to participate in this patient's care.  Gena Fray, PharmD PGY1 Pharmacy Resident    Please see [facility] for all Pharmacists' Contact Phone Numbers 05/01/2022, 1:24 PM

## 2022-05-01 NOTE — Consult Note (Signed)
NAME:  [name], MRN:  [id number], DOB:  [birth year], LOS: 3 ADMISSION DATE:  04/28/2022, CONSULTATION DATE: September 2023 REFERRING MD: Dr. Haroldine Laws, CHIEF COMPLAINT: Lung mass  History of Present Illness:  This is a 71 year old gentleman, past medical history of squamous of carcinoma of the lung left lower lobe 1985, coronary artery disease, CABG times 10/05/2011, aortic stenosis status post TAVR 2019, chronic heart failure with reduced ejection fraction, left bundle branch block, Saint Jude CRT-D device, hypertension and obstructive sleep apnea.  Patient was admitted in January for heart failure discharged on losartan, Toprol XR and spironolactone he did not tolerate Entresto due to hypotension.  Readmitted in February 2023.  Unfortunately remitted in September for acute on chronic systolic heart failure.  Plans for right heart cath left heart cath.  Treated with diuretics, Wilder Glade, stopped beta-blocker due to low output heart failure, on digoxin losartan spironolactone.  Not a candidate for transplant considered high risk for bad placement.  During patient's vad placement work-up he underwent a chest abdomen pelvis CT.  Patient was found to have large mass with bulky necrotic hilar adenopathy.  Multiple parenchymal lung lesions concerning for advanced age bronchogenic carcinoma.  Potentially recurrence from his previous malignancy.  I was consulted for recommendations and management consideration for biopsy.  Pertinent  Medical History   Past Medical History:  Diagnosis Date   AICD (automatic cardioverter/defibrillator) present 02/11/2020   Anemia    Arthritis    Atrial tachycardia (HCC)    Chronic systolic CHF (congestive heart failure) ([place]) 07/04/2018   CKD (chronic kidney disease), stage III ([place])    Coronary artery disease 02/2012   a. s/p stenting in 2013  b. s/p CABGx2V (SVG--> PDA, SVG--> LCx).   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  Intolerant to PAP therapy   S/P TAVR (transcatheter aortic valve replacement) 05/15/2018   29 mm Edwards Sapien 3 transcatheter heart valve placed via percutaneous right transfemoral approach    Severe aortic stenosis      Significant Hospital Events: Including procedures, antibiotic start and stop dates in addition to other pertinent events     Interim History / Subjective:   Discussed information today at bedside with patient his brother and sister-in-law.  Patient seems anxious.  Objective   Blood pressure 114/77, pulse (!) 127, temperature 97.6 F (36.4 C), temperature source Oral, resp. rate (!) 21, height 5\' 10"  (1.778 m), weight 60.5 kg, SpO2 98 %. CVP:  [2 mmHg-4 mmHg] 4 mmHg      Intake/Output Summary (Last 24 hours) at 05/01/2022 1125 Last data filed at 05/01/2022 1000 Gross per 24 hour  Intake 1321.35 ml  Output 3400 ml  Net -2078.65 ml   Filed Weights   04/29/22 0203 04/30/22 0456 05/01/22 0547  Weight: 60 kg 59.2 kg 60.5 kg    Examination: General: Chronically ill-appearing elderly gentleman  resting comfortable sitting up in bed HENT: NCAT, tracking appropriately, muscle wasting present Lungs: Diminished breath sounds bilaterally Cardiovascular: Regular rate rhythm, distant heart tones Abdomen: Soft nontender nondistended Extremities: No significant edema, muscle wasting present Neuro: Alert oriented following commands GU:  Deferred  Resolved Hospital Problem list     Assessment & Plan:   Concern for advanced stage lung cancer, large 5 cm left lower lobe mass, 3.5 cm pleural-based mass. History of squamous cell carcinoma, possible recurrence of disease. Bulky necrotic appearing adenopathy Advanced heart failure, on milrinone, managed by heart failure service  Plan: Today we talked about all of the various options to include tissue biopsy. His lesions in the chest are accessible by bronchoscopy. This would however require him to go to sleep for tissue diagnosis. It does seem that he would like to know whether or not he has recurrent malignancy. It may offer some closure in this situation and it is difficult not knowing. Cardiology has consulted interventional radiology for consideration of CT-guided biopsy.  I do agree both biopsy options (bronch vs ttna) are high risk however bronchoscopy does require general anesthesia. Pet imaging will never be approved to be obtained while he is inpatient.  It would be good to know whether or not he is a candidate for systemic treatments from oncology before putting him to sleep for tissue biopsy as it may not change his outcome. I will call and discuss with Dr. Earlie Server.  If deemed absolutely necessary we can take patient to bronchoscopy suite for tissue diagnosis.  However patient and family need to understand the risk attributed with procedure.  However I think the best course of action would be consideration for palliative care focused approach and transition to hospice.  Labs   CBC: Recent Labs  Lab 04/27/22 1257 04/28/22 0841 04/28/22 0858 04/28/22 1730 04/29/22 0700 04/30/22 0339 05/01/22 0235  WBC 8.6  --   --  7.8 7.1 8.0 7.7  NEUTROABS  --   --   --  6.0  --   --   --   HGB 11.4*   < > 11.6* 10.5* 11.0* 11.1* 10.6*  HCT 35.7*   < > 34.0* 32.1* 33.7* 32.6* 31.9*  MCV 72.9*  --   --  71.3* 71.1* 69.1* 70.4*  PLT 457*  --   --  404* 382 372 379   <  > = values in this interval not displayed.    Basic Metabolic Panel: Recent Labs  Lab 04/27/22 1251 04/28/22 0841 04/28/22 0858 04/28/22 1730 04/29/22 0700 04/30/22 0339 05/01/22 0235  NA 137   < > 136 136 136 134* 134*  K 4.4   < > 4.6 4.3 3.8 3.7 4.1  CL 100  --   --  104 97* 95* 98  CO2 28  --   --  22 26 26 25   GLUCOSE 91  --   --  438* 238* 158* 189*  BUN 32*  --   --  27* 26* 27* 27*  CREATININE 1.09  --   --  1.01 0.94 0.98 0.84  CALCIUM 9.0  --   --  8.2* 8.6* 8.9 9.2  MG  --   --   --  1.8  --   --   --    < > = values in this interval not displayed.   GFR: Estimated Creatinine Clearance: 68 mL/min (by C-G formula based on SCr of 0.84 mg/dL). Recent Labs  Lab 04/28/22 1730 04/29/22 0700 04/30/22  3532 05/01/22 0235  WBC 7.8 7.1 8.0 7.7    Liver Function Tests: Recent Labs  Lab 04/28/22 1730  AST 14*  ALT 10  ALKPHOS 82  BILITOT 0.6  PROT 6.5  ALBUMIN 2.4*   No results for input(s): "LIPASE", "AMYLASE" in the last 168 hours. No results for input(s): "AMMONIA" in the last 168 hours.  ABG    Component Value Date/Time   PHART 7.396 04/28/2022 0841   PCO2ART 36.6 04/28/2022 0841   PO2ART 96 04/28/2022 0841   HCO3 28.2 (H) 04/28/2022 0858   TCO2 30 04/28/2022 0858   ACIDBASEDEF 2.0 04/28/2022 0841   O2SAT 50.2 05/01/2022 0533     Coagulation Profile: No results for input(s): "INR", "PROTIME" in the last 168 hours.  Cardiac Enzymes: No results for input(s): "CKTOTAL", "CKMB", "CKMBINDEX", "TROPONINI" in the last 168 hours.  HbA1C: Hgb A1c MFr Bld  Date/Time Value Ref Range Status  04/29/2022 07:00 AM 9.6 (H) 4.8 - 5.6 % Final    Comment:    (NOTE) Pre diabetes:          5.7%-6.4%  Diabetes:              >6.4%  Glycemic control for   <7.0% adults with diabetes   04/28/2022 05:30 PM 9.6 (H) 4.8 - 5.6 % Final    Comment:    (NOTE) Pre diabetes:          5.7%-6.4%  Diabetes:              >6.4%  Glycemic control for   <7.0% adults  with diabetes     CBG: Recent Labs  Lab 04/30/22 1602 04/30/22 2119 04/30/22 2356 05/01/22 0531 05/01/22 1120  GLUCAP 448* 380* 247* 216* 296*    Review of Systems:   Review of Systems  Constitutional:  Positive for weight loss. Negative for chills, fever and malaise/fatigue.  HENT:  Negative for hearing loss, sore throat and tinnitus.   Eyes:  Negative for blurred vision and double vision.  Respiratory:  Positive for shortness of breath. Negative for cough, hemoptysis, sputum production, wheezing and stridor.   Cardiovascular:  Negative for chest pain, palpitations, orthopnea, leg swelling and PND.  Gastrointestinal:  Negative for abdominal pain, constipation, diarrhea, heartburn, nausea and vomiting.  Genitourinary:  Negative for dysuria, hematuria and urgency.  Musculoskeletal:  Negative for joint pain and myalgias.  Skin:  Negative for itching and rash.  Neurological:  Negative for dizziness, tingling, weakness and headaches.  Endo/Heme/Allergies:  Negative for environmental allergies. Does not bruise/bleed easily.  Psychiatric/Behavioral:  Negative for depression. The patient is nervous/anxious. The patient does not have insomnia.   All other systems reviewed and are negative.    Past Medical History:  He,  has a past medical history of AICD (automatic cardioverter/defibrillator) present (02/11/2020), Anemia, Arthritis, Atrial tachycardia ([place]), Chronic systolic CHF (congestive heart failure) ([place]) (07/04/2018), CKD (chronic kidney disease), stage III ([place]), Coronary artery disease (02/2012), Diabetes mellitus, Dilated aortic root ([place]), Dyslipidemia, Erectile dysfunction, GERD (gastroesophageal reflux disease), History of CVA (cerebrovascular accident), History of kidney stones, History of radiation therapy (12/03/2018-12/13/2018), Hx of radiation therapy to mediastinum (1985), Hypertension, Malignant seminoma of mediastinum ([place]) (1985), OSA (obstructive sleep apnea)  (07/04/2018), S/P TAVR (transcatheter aortic valve replacement) (05/15/2018), and Severe aortic stenosis.   Surgical History:   Past Surgical History:  Procedure Laterality Date   BIV ICD INSERTION CRT-D N/A 02/11/2020   Procedure: BIV ICD INSERTION CRT-D;  Surgeon: Thompson Grayer, MD;  [facility]  INVASIVE CV LAB;  Service: Cardiovascular;  Laterality: N/A;   COLONOSCOPY  07/25/2012   Procedure: COLONOSCOPY;  Surgeon: Winfield Cunas., MD;  Location: [facility] ENDOSCOPY;  Service: Endoscopy;  Laterality: N/A;   COLONOSCOPY N/A 12/02/2013   Procedure: COLONOSCOPY;  Surgeon: Winfield Cunas., MD;  Location: [facility] ENDOSCOPY;  Service: Endoscopy;  Laterality: N/A;   CORONARY ARTERY BYPASS GRAFT N/A 01/04/2013   Procedure: CORONARY ARTERY BYPASS GRAFTING (CABG) times two using right saphenous vein harvested with endoscope.;  Surgeon: Ivin Poot, MD;  Location: [place];  Service: Open Heart Surgery;  Laterality: N/A;   ESOPHAGOGASTRODUODENOSCOPY  07/20/2012   Procedure: ESOPHAGOGASTRODUODENOSCOPY (EGD);  Surgeon: Winfield Cunas., MD;  Location: [facility] ENDOSCOPY;  Service: Endoscopy;  Laterality: N/A;   ESOPHAGOGASTRODUODENOSCOPY N/A 12/02/2013   Procedure: ESOPHAGOGASTRODUODENOSCOPY (EGD);  Surgeon: Winfield Cunas., MD;  Location: [facility] ENDOSCOPY;  Service: Endoscopy;  Laterality: N/A;   HOT HEMOSTASIS N/A 12/02/2013   Procedure: HOT HEMOSTASIS (ARGON PLASMA COAGULATION/BICAP);  Surgeon: Winfield Cunas., MD;  Location: [facility] ENDOSCOPY;  Service: Endoscopy;  Laterality: N/A;   INTRAOPERATIVE TRANSESOPHAGEAL ECHOCARDIOGRAM N/A 01/04/2013   Procedure: INTRAOPERATIVE TRANSESOPHAGEAL ECHOCARDIOGRAM;  Surgeon: Ivin Poot, MD;  Location: [place];  Service: Open Heart Surgery;  Laterality: N/A;   INTRAOPERATIVE TRANSTHORACIC ECHOCARDIOGRAM N/A 05/15/2018   Procedure: INTRAOPERATIVE TRANSTHORACIC ECHOCARDIOGRAM;  Surgeon: Burnell Blanks, MD;  Location: [place];  Service: Open Heart Surgery;  Laterality:  N/A;   IR THORACENTESIS ASP PLEURAL SPACE W/IMG GUIDE  10/19/2018   LEFT HEART CATHETERIZATION WITH CORONARY ANGIOGRAM N/A 03/06/2012   Procedure: LEFT HEART CATHETERIZATION WITH CORONARY ANGIOGRAM;  Surgeon: [name], MD;  Location: [place] CATH LAB;  Service: Cardiovascular;  Laterality: N/A;   LITHOTRIPSY     [place] Left 01/04/2013   Procedure: RADIAL ARTERY HARVEST;  Surgeon: Ivin Poot, MD;  Location: [place];  Service: Vascular;  Laterality: Left;  Artery not havested. Unsuitable for use.   resection mediastinal seminonma     RIGHT/LEFT HEART CATH AND CORONARY/GRAFT ANGIOGRAPHY N/A 11/15/2016   Procedure: Right/Left Heart Cath and Coronary/Graft Angiography;  Surgeon: Leonie Man, MD;  Location: [place] CV LAB;  Service: Cardiovascular;  Laterality: N/A;   RIGHT/LEFT HEART CATH AND CORONARY/GRAFT ANGIOGRAPHY N/A 04/13/2018   Procedure: RIGHT/LEFT HEART CATH AND CORONARY/GRAFT ANGIOGRAPHY;  Surgeon: Jolaine Artist, MD;  Location: [place] CV LAB;  Service: Cardiovascular;  Laterality: N/A;   RIGHT/LEFT HEART CATH AND CORONARY/GRAFT ANGIOGRAPHY N/A 04/28/2022   Procedure: RIGHT/LEFT HEART CATH AND CORONARY/GRAFT ANGIOGRAPHY;  Surgeon: Jolaine Artist, MD;  Location: [place] CV LAB;  Service: Cardiovascular;  Laterality: N/A;   TRANSCATHETER AORTIC VALVE REPLACEMENT, TRANSFEMORAL  05/15/2018   TRANSCATHETER AORTIC VALVE REPLACEMENT, TRANSFEMORAL N/A 05/15/2018   Procedure: TRANSCATHETER AORTIC VALVE REPLACEMENT, TRANSFEMORAL;  Surgeon: Burnell Blanks, MD;  Location: [place];  Service: Open Heart Surgery;  Laterality: N/A;     Social History:   reports that he quit smoking about 37 years ago. His smoking use included cigarettes. He has never used smokeless tobacco. He reports that he does not drink alcohol and does not use drugs.   Family History:  His family history includes Cancer in his brother; Diabetes in his father,  mother, and sister; Heart failure in his mother; Hypertension in his mother.   Allergies Allergies  Allergen Reactions   Entresto [Sacubitril-Valsartan] Other (See Comments)    Hypotension    Lipitor [  Atorvastatin] Other (See Comments)    Myalgias    Adhesive [Tape] Rash     Home Medications  Prior to Admission medications   Medication Sig Start Date End Date Taking? Authorizing Provider  acetaminophen (TYLENOL) 500 MG tablet Take 1,500 mg by mouth daily as needed for mild pain or headache.   Yes [provider]  amitriptyline (ELAVIL) 25 MG tablet Take 25 mg by mouth at bedtime.    Yes [provider]  B Complex-C (SUPER B COMPLEX PO) Take 1 tablet by mouth daily.   Yes [provider]  dapagliflozin propanediol (FARXIGA) 10 MG TABS tablet Take 1 tablet (10 mg total) by mouth daily. 11/08/21  Yes Clegg, Amy D, NP  digoxin (LANOXIN) 0.125 MG tablet Take 1 tablet (0.125 mg total) by mouth daily. 11/08/21  Yes Clegg, Amy D, NP  insulin detemir (LEVEMIR) 100 UNIT/ML injection Inject 10-24 Units into the skin See admin instructions. 20-24 units in the morning, 10 units at bedtime   Yes [provider]  insulin lispro (HUMALOG) 100 UNIT/ML KwikPen Inject 0-4 Units into the skin See admin instructions. Per sliding scale at Breakfast and Dinner - pt could not tell me his sliding scale. 03/26/19  Yes [provider]  levothyroxine (SYNTHROID, LEVOTHROID) 50 MCG tablet Take 50 mcg by mouth daily before breakfast.    Yes [provider]  losartan (COZAAR) 25 MG tablet TAKE 1 TABLET(25 MG) BY MOUTH DAILY Patient taking differently: Take 25 mg by mouth daily. 12/16/21  Yes Bensimhon, Shaune Pascal, MD  metFORMIN (GLUCOPHAGE) 500 MG tablet Take 2 tablets (1,000 mg total) by mouth 2 (two) times daily with a meal. 05/17/18  Yes Eileen Stanford, PA-C  metoprolol succinate (TOPROL-XL) 100 MG 24 hr tablet Take 0.5 tablets (50 mg total) by mouth daily. Take with  or immediately following a meal. Patient taking differently: Take 50 mg by mouth in the morning. 10/05/21  Yes Domenic Polite, MD  potassium chloride SA (KLOR-CON M) 20 MEQ tablet Take 1 tablet (20 mEq total) by mouth daily. 04/13/22  Yes Milford, Maricela Bo, FNP  rivaroxaban (XARELTO) 20 MG TABS tablet Take 1 tablet (20 mg total) by mouth daily with supper. Patient taking differently: Take 20 mg by mouth daily. 04/22/22  Yes Fenton, Clint R, PA  spironolactone (ALDACTONE) 25 MG tablet TAKE 1 TABLET(25 MG) BY MOUTH DAILY Patient taking differently: Take 25 mg by mouth daily. 03/21/22  Yes Bensimhon, Shaune Pascal, MD  torsemide (DEMADEX) 20 MG tablet Take 2 tablets (40 mg total) by mouth daily. 03/30/22  Yes Milford, Maricela Bo, FNP  zolpidem (AMBIEN) 5 MG tablet Take 5 mg by mouth at bedtime as needed for sleep.   Yes [provider]  simvastatin (ZOCOR) 20 MG tablet Take 20 mg by mouth every evening.  Patient not taking: Reported on 04/28/2022    [provider]     Garner Nash, DO [facility] 05/01/2022 1:27 PM

## 2022-05-01 NOTE — Progress Notes (Signed)
ANTICOAGULATION CONSULT NOTE  Pharmacy Consult for heparin Indication: atrial fibrillation  Allergies  Allergen Reactions   Entresto [Sacubitril-Valsartan] Other (See Comments)    Hypotension    Lipitor [Atorvastatin] Other (See Comments)    Myalgias    Adhesive [Tape] Rash    Patient Measurements: Height: 5\' 10"  (177.8 cm) Weight: 60.5 kg (133 lb 4.8 oz) IBW/kg (Calculated) : 73 Heparin Dosing Weight: 60kg  Vital Signs: Temp: 97.7 F (36.5 C) (09/17 2001) Temp Source: Oral (09/17 2001) BP: 99/68 (09/17 2001) Pulse Rate: 108 (09/17 2001)  Labs: Recent Labs     0000 04/29/22 0238 04/29/22 0700 04/29/22 1058 04/30/22 0339 04/30/22 1400 04/30/22 1500 05/01/22 0235 05/01/22 1224 05/01/22 1954  HGB   < >  --  11.0*  --  11.1*  --   --  10.6*  --   --   HCT  --   --  33.7*  --  32.6*  --   --  31.9*  --   --   PLT  --   --  382  --  372  --   --  379  --   --   APTT  --  34  --    < > 49*  --    < > 47* 60* 49*  HEPARINUNFRC  --  0.49  --   --   --  0.49  --  0.34  --   --   CREATININE  --   --  0.94  --  0.98  --   --  0.84  --   --    < > = values in this interval not displayed.     Estimated Creatinine Clearance: 68 mL/min (by C-G formula based on SCr of 0.84 mg/dL).   Medical History: Past Medical History:  Diagnosis Date   AICD (automatic cardioverter/defibrillator) present 02/11/2020   Anemia    Arthritis    Atrial tachycardia (HCC)    Chronic systolic CHF (congestive heart failure) ([place]) 07/04/2018   CKD (chronic kidney disease), stage III ([place])    Coronary artery disease 02/2012   a. s/p stenting in 2013  b. s/p CABGx2V (SVG--> PDA, SVG--> LCx).   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  Intolerant to PAP therapy   S/P TAVR (transcatheter aortic valve replacement) 05/15/2018   29 mm Edwards Sapien 3 transcatheter heart valve placed via percutaneous right transfemoral approach    Severe aortic stenosis     Medications:  Medications Prior to Admission  Medication Sig Dispense Refill Last Dose   acetaminophen (TYLENOL) 500 MG tablet Take 1,500 mg by mouth daily as needed for mild pain or headache.   04/27/2022   amitriptyline (ELAVIL) 25 MG tablet Take 25 mg by mouth at bedtime.    04/27/2022   B Complex-C (SUPER B COMPLEX PO) Take 1 tablet  by mouth daily.   Past Week   dapagliflozin propanediol (FARXIGA) 10 MG TABS tablet Take 1 tablet (10 mg total) by mouth daily. 90 tablet 3 Past Week   digoxin (LANOXIN) 0.125 MG tablet Take 1 tablet (0.125 mg total) by mouth daily. 30 tablet 3 Past Week   insulin detemir (LEVEMIR) 100 UNIT/ML injection Inject 10-24 Units into the skin See admin instructions. 20-24 units in the morning, 10 units at bedtime   Past Week   insulin lispro (HUMALOG) 100 UNIT/ML KwikPen Inject 0-4 Units into the skin See admin instructions. Per sliding scale at Breakfast and Dinner - pt could not tell me his sliding scale.   Past Week   levothyroxine (SYNTHROID, LEVOTHROID) 50 MCG tablet Take 50 mcg by mouth daily before breakfast.    Past Week   losartan (COZAAR) 25 MG tablet TAKE 1 TABLET(25 MG) BY MOUTH DAILY (Patient taking differently: Take 25 mg by mouth daily.) 90 tablet 3 Past Week   metFORMIN (GLUCOPHAGE) 500 MG tablet Take 2 tablets (1,000 mg total) by mouth 2 (two) times daily with a meal.   Past Week   metoprolol succinate (TOPROL-XL) 100 MG 24 hr tablet Take  0.5 tablets (50 mg total) by mouth daily. Take with or immediately following a meal. (Patient taking differently: Take 50 mg by mouth in the morning.)   04/26/2022 at 08:00   potassium chloride SA (KLOR-CON M) 20 MEQ tablet Take 1 tablet (20 mEq total) by mouth daily. 180 tablet 3 Past Week   rivaroxaban (XARELTO) 20 MG TABS tablet Take 1 tablet (20 mg total) by mouth daily with supper. (Patient taking differently: Take 20 mg by mouth daily.) 28 tablet 0 04/26/2022 at 08:00   spironolactone (ALDACTONE) 25 MG tablet TAKE 1 TABLET(25 MG) BY MOUTH DAILY (Patient taking differently: Take 25 mg by mouth daily.) 30 tablet 3 Past Week   torsemide (DEMADEX) 20 MG tablet Take 2 tablets (40 mg total) by mouth daily. 180 tablet 3 Past Week   zolpidem (AMBIEN) 5 MG tablet Take 5 mg by mouth at bedtime as needed for sleep.   04/27/2022   simvastatin (ZOCOR) 20 MG tablet Take 20 mg by mouth every evening.  (Patient not taking: Reported on 04/28/2022)   Not Taking   Infusions:   sodium chloride     sodium chloride     amiodarone     heparin 1,950 Units/hr (05/01/22 1721)   milrinone 0.125 mcg/kg/min (05/01/22 1300)    Assessment: Pharmacy consulted to dose heparin for h/o Afib, while PTA Xarelto is on hold. Last Xarelto dose charted as 9/12 PM. Pt is s/p RHC. CBC is stable.   Patient was found to have large mass with bulky necrotic hilar adenopathy. Patient is now DNR/DNI. Remains NSR but rate is up with milrinone. Will continue to follow coag plans.   aPTT 60 on drip rate 1850 units/hr subtherapeutic after rate increase. Last level 47 subtherapeutic as well. Hgb 10.6 and plt 379. No s/sx bleeding noted in chart.    PTT still came back subtherapeutic tonight. We will increase again and check level in AM.  Goal of Therapy:  Heparin level 0.3-0.7 units/ml aPTT 66-102 seconds Monitor platelets by anticoagulation protocol: Yes   Plan:  Increase heparin to 2150 units/hr Recheck aPTT/HL in AM Daily aPTT,  heparin level and CBC   Onnie Boer, PharmD, BCIDP, AAHIVP, CPP Infectious Disease Pharmacist 05/01/2022 8:31 PM

## 2022-05-01 NOTE — Progress Notes (Signed)
  Amiodarone Drug - Drug Interaction Consult Note  Recommendations: - Keep current dose of digoxin; obtain levels 9/19 to monitor for increased serum concentrations.   - Qtc 460 from this AM. K wnl. Consider dose reduction or d/c of amitriptyline if Qtc > 500. Monitor K and Mg.   Amiodarone is metabolized by the cytochrome P450 system and therefore has the potential to cause many drug interactions. Amiodarone has an average plasma half-life of 50 days (range 20 to 100 days).   There is potential for drug interactions to occur several weeks or months after stopping treatment and the onset of drug interactions may be slow after initiating amiodarone.   [x]  Statins: Increased risk of myopathy. Simvastatin- restrict dose to 20mg  daily. Other statins: counsel patients to report any muscle pain or weakness immediately.   On simvastatin 20 mg; do not increase dose   []  Anticoagulants: Amiodarone can increase anticoagulant effect. Consider warfarin dose reduction. Patients should be monitored closely and the dose of anticoagulant altered accordingly, remembering that amiodarone levels take several weeks to stabilize.  []  Antiepileptics: Amiodarone can increase plasma concentration of phenytoin, the dose should be reduced. Note that small changes in phenytoin dose can result in large changes in levels. Monitor patient and counsel on signs of toxicity.  []  Beta blockers: increased risk of bradycardia, AV block and myocardial depression. Sotalol - avoid concomitant use.  []   Calcium channel blockers (diltiazem and verapamil): increased risk of bradycardia, AV block and myocardial depression.  []   Cyclosporine: Amiodarone increases levels of cyclosporine. Reduced dose of cyclosporine is recommended.  [x]  Digoxin dose should be halved when amiodarone is started.  Current dose of digoxin 0.0625 mg daily.   [x]  Diuretics: increased risk of cardiotoxicity if hypokalemia occurs.  Holding diuretics for  now.   []  Oral hypoglycemic agents (glyburide, glipizide, glimepiride): increased risk of hypoglycemia. Patient's glucose levels should be monitored closely when initiating amiodarone therapy.   [x]  Drugs that prolong the QT interval:  Torsades de pointes risk may be increased with concurrent use - avoid if possible.  Monitor QTc, also keep magnesium/potassium WNL if concurrent therapy can't be avoided.  Antibiotics: e.g. fluoroquinolones, erythromycin.  Antiarrhythmics: e.g. quinidine, procainamide, disopyramide, sotalol.  Antipsychotics: e.g. phenothiazines, haloperidol.   Lithium, tricyclic antidepressants, and methadone.  On amitriptyline.    Thank You,   Gena Fray, PharmD PGY1 Pharmacy Resident   05/01/2022 2:23 PM

## 2022-05-01 NOTE — Progress Notes (Signed)
   RN called. CBG > 400. SSI calls for 15 units up to 400. A1c 9.6. PLAN:  Give Novolog 20 u now instead of 15. Consult diabetes coordinator.  Richardson Dopp, PA-C    05/01/2022 3:57 PM

## 2022-05-01 NOTE — Progress Notes (Addendum)
  Request seen for Image guided Lung Biopsy.  Per chart, Dr. Haroldine Laws spoke with Dr. Valeta Harms and he requested biopsy of the left pleural based mass seen on CT.  Image were reviwed by Dr. Kathlene Cote.  Ideally patient should have a PET scan.  Dr. Kathlene Cote feels the LLL mass could be biopsied via bronchoscopy. Either way, patient is high risk for either procedure.  Bill Gasner S Pia Jedlicka PA-C 05/01/2022 10:57 AM

## 2022-05-01 NOTE — Progress Notes (Signed)
Advanced Heart Failure Rounding Note  PCP-Cardiologist: Fransico Him, MD   Subjective:    On Milrinone 0.25 mcg/kg/min. Co-ox 58 -> 50%. CVP 10  Continues with back pain. Feels worse today but says he still feels better than when he came in  Denies  CP or SOB. HR back up again today. HR in 130s  Seen by Palliative Care and now DNR/DNI but wants aggressive care if he is a candidate.  I spoke with IR. Favor PET scan prior to lung biopsy to help target most appropriate lesion.  Chest CT 1. Significant interval enlargement of bulky, likely necrotic left hilar mass/lymphadenopathy. 2. Multiple new pulmonary parenchymal and pleural masses and nodules throughout the left lung, consistent with worsened pulmonary and pleural metastatic disease.  R/LHC 9/14  RA 12 PA 61/26 (39) PCWP 20 (v 27) Fick CO/CI 3.1/1.7 PVR 6.1 PAPi 2.9 PA sat 45%, 47% 3 v CAD with chronically occluded RCA and LCX. Patent SVGs to R PDA and OM. 90% p and 95% d LAD.     Objective:   Weight Range: 60.5 kg Body mass index is 19.13 kg/m.   Vital Signs:   Temp:  [97.6 F (36.4 C)-98.8 F (37.1 C)] 97.6 F (36.4 C) (09/17 0733) Pulse Rate:  [91-125] 122 (09/17 0733) Resp:  [17-27] 27 (09/17 0954) BP: (103-133)/(66-88) 105/66 (09/17 0954) SpO2:  [97 %-100 %] 98 % (09/17 0733) Weight:  [60.5 kg] 60.5 kg (09/17 0547) Last BM Date : 04/29/22  Weight change: Filed Weights   04/29/22 0203 04/30/22 0456 05/01/22 0547  Weight: 60 kg 59.2 kg 60.5 kg    Intake/Output:   Intake/Output Summary (Last 24 hours) at 05/01/2022 1051 Last data filed at 05/01/2022 1000 Gross per 24 hour  Intake 1321.35 ml  Output 3400 ml  Net -2078.65 ml       Physical Exam    CVP 10 General:  Cachetic weak appearing. In pain. No resp difficulty HEENT: normal Neck: supple.JVP 10  Carotids 2+ bilat; no bruits. No lymphadenopathy or thryomegaly appreciated. Cor: PMI laterally displaced. Regular  tachy + s3 Lungs:  crackles rhonchi at left base Abdomen: soft, nontender, nondistended. No hepatosplenomegaly. No bruits or masses. Good bowel sounds. Extremities: no cyanosis, clubbing, rash, edema Neuro: alert & orientedx3, cranial nerves grossly intact. moves all 4 extremities w/o difficulty. Affect pleasant   Telemetry   Sinus tach 105-130 Personally reviewed  Labs    CBC Recent Labs    04/28/22 1730 04/29/22 0700 04/30/22 0339 05/01/22 0235  WBC 7.8   < > 8.0 7.7  NEUTROABS 6.0  --   --   --   HGB 10.5*   < > 11.1* 10.6*  HCT 32.1*   < > 32.6* 31.9*  MCV 71.3*   < > 69.1* 70.4*  PLT 404*   < > 372 379   < > = values in this interval not displayed.    Basic Metabolic Panel Recent Labs    04/28/22 1730 04/29/22 0700 04/30/22 0339 05/01/22 0235  NA 136   < > 134* 134*  K 4.3   < > 3.7 4.1  CL 104   < > 95* 98  CO2 22   < > 26 25  GLUCOSE 438*   < > 158* 189*  BUN 27*   < > 27* 27*  CREATININE 1.01   < > 0.98 0.84  CALCIUM 8.2*   < > 8.9 9.2  MG 1.8  --   --   --    < > =  values in this interval not displayed.    Liver Function Tests Recent Labs    04/28/22 1730  AST 14*  ALT 10  ALKPHOS 82  BILITOT 0.6  PROT 6.5  ALBUMIN 2.4*    No results for input(s): "LIPASE", "AMYLASE" in the last 72 hours. Cardiac Enzymes No results for input(s): "CKTOTAL", "CKMB", "CKMBINDEX", "TROPONINI" in the last 72 hours.  BNP: BNP (last 3 results) Recent Labs    03/08/22 1114 03/30/22 1028 04/27/22 1251  BNP 1,980.1* 1,880.7* 1,466.6*     ProBNP (last 3 results) No results for input(s): "PROBNP" in the last 8760 hours.   D-Dimer No results for input(s): "DDIMER" in the last 72 hours. Hemoglobin A1C Recent Labs    04/29/22 0700  HGBA1C 9.6*    Fasting Lipid Panel Recent Labs    04/29/22 0700  CHOL 119  HDL 39*  LDLCALC 65  TRIG 76  CHOLHDL 3.1    Thyroid Function Tests Recent Labs    04/29/22 0700  TSH 4.100     Other results:   Imaging    No  results found.   Medications:     Scheduled Medications:  (feeding supplement) PROSource Plus  30 mL Oral BID BM   amitriptyline  25 mg Oral QHS   Chlorhexidine Gluconate Cloth  6 each Topical Daily   dapagliflozin propanediol  10 mg Oral Daily   digoxin  0.0625 mg Oral Daily   feeding supplement  237 mL Oral TID BM   insulin aspart  0-15 Units Subcutaneous TID WC   insulin detemir  10 Units Subcutaneous Daily   levothyroxine  50 mcg Oral QAC breakfast   losartan  12.5 mg Oral Daily   multivitamin with minerals  1 tablet Oral Daily   simvastatin  20 mg Oral QPM   sodium chloride flush  10-40 mL Intracatheter Q12H   sodium chloride flush  3 mL Intravenous Q12H   sodium chloride flush  3 mL Intravenous Q12H   spironolactone  25 mg Oral Daily    Infusions:  sodium chloride     sodium chloride     heparin 1,850 Units/hr (05/01/22 0440)   milrinone 0.25 mcg/kg/min (05/01/22 0440)    PRN Medications: sodium chloride, sodium chloride, acetaminophen, sodium chloride flush, sodium chloride flush, sodium chloride flush, torsemide, traMADol, zolpidem    Patient Profile   71 y/o male with chronic systolic HF due to ICM with several weeks progressive HF symptoms. Presented 9/14 for outpatient [facility] which demonstrated low cardiac output Fick CO/CI 3.1/1.7. Admitted from cath lab for HF optimization w/ initiation of milrinone and VAD w/u.   Assessment/Plan   Chronic Systolic Heart Failure: - s/p [employer] CRT-D 01/2020. Unable to place LV lead. Interestingly, he had a RBBB during a recent admission and had LBBB on prior ECGs. Discussed with Dr. Lovena Le, with current RBBB a left bundle lead will not be likely to help.  - Had PYP 2020 not suggestive of amyloid.  - Echo (1/23): EF 20-25%, RV significantly reduced, RVSP 50 mmHg, moderate MR, moderate TR, mean gradient 2 mmHg across aortic valve prosthesis. - CPX (3/23): moderate to severe HF limitation. - R/LHC 9/14: RA 12, PA mean 39, PCWP  20, CI 1.7.  - On milrinone 0.25. Co-ox 58%-> 50% - CVP 10 (checked personally) Holding diuretics currently  - Continue Farxiga 10 mg daily.  - Continue digoxin 0.125 mg daily. - Continue losartan 12.5 mg daily (failed Entresto in past due to low BP).  -  Continue spironolactone 25 mg daily. - off ? blocker due to low output  - Corlanor previously stopped d/t cost.  - Admitted for VAD w/u but CT chest shows progressive metastatic lung CA. Work-up as below.  - Not candidate for VAD - CVP climbing. Much more tachycardic in setting of milrinone and pain. Previous ECG looks like sinus tach. Hard to tell on tele but there are no rapid spikes in HR and just gradual accelerations/decelerations suggestive of sinus. Will cut milrinone to 0.125. Give low-dose ivabradine and watch response.  - Will give one dose lasix - He is end-stage   2. CAD - h/o CABG 2013 - LHC this adm: patent SVGs to R PDA and OM. 90% p and 95% d LAD - No s/s angina. Continue medical rx.  - No ASA with A/C. - Continue simvastatin 20 mg daily. LDL ok at 65 mg/dL    3. Paroxysmal AFib - Remains in NSR but rate up with milrinone (see discussion above) - CHA2DS2-VASc score is 5 - He does not tolerate AFib well - Now on heparin in setting of need for procedures  - Continue CPAP   4. DM2 - Insulin dependent. - Hgb A1C 9.6  - On SGLT2i. - Managed by PCP.   5. OSA - Continue CPAP.   6. Aortic valve stenosis - S/P TAVR 2019 with 29 mm S3 - Valve okay on  echo 01/23   7. Hypothyroid - On levothyroxine.    8. 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 Stage IV Lung CA, squamous cell - found to have a 3 cm mass in the superior segment of left lower lobe which was biopsy-proven to be squamous cell carcinoma. - He had some mild mediastinal adenopathy without  significant activity on PET scan.   - He was not felt to be candidate for lobectomy due to his severe cardiac disease and underwent SBRT. - chest CT shows worsening metastatic lung CA - I spoke with Dr. Valeta Harms on 9/16 who recommended CT-guided biopsy of L pleural based mass to establish firm tissue diagnosis and help establish options with the understanding that he may not be good candidate for aggressive chemotherapy with his heart disease. - IR consulted. Have suggested starting with PET scan to identify appropriate target/approach for biopsy.  - I have asked Dr. Valeta Harms to see in consultation to help with planning next steps.  - Palliative care team also involved. Code status updated to DNR/DNI.   Length of Stay: 3  Glori Bickers, MD  05/01/2022, 10:51 AM  Advanced Heart Failure Team Pager [phone] (M-F; 7a - 5p)  Please contact [facility] for night-coverage after hours (5p -7a ) and weekends on [email or web address]

## 2022-05-01 NOTE — Progress Notes (Signed)
Patient's HR now slowed down some and appears to have flutter waves in several leads.   Will start IV amio. Stop ivabradine.   Glori Bickers, MD  1:33 PM

## 2022-05-01 NOTE — Progress Notes (Signed)
ANTICOAGULATION CONSULT NOTE  Pharmacy Consult for heparin Indication: atrial fibrillation  Allergies  Allergen Reactions   Entresto [Sacubitril-Valsartan] Other (See Comments)    Hypotension    Lipitor [Atorvastatin] Other (See Comments)    Myalgias    Adhesive [Tape] Rash    Patient Measurements: Height: 5\' 10"  (177.8 cm) Weight: 59.2 kg (130 lb 9.6 oz) IBW/kg (Calculated) : 73 Heparin Dosing Weight: 60kg  Vital Signs: Temp: 98.8 F (37.1 C) (09/17 0102) Temp Source: Oral (09/17 0102) BP: 107/71 (09/17 0102) Pulse Rate: 123 (09/17 0102)  Labs: Recent Labs    04/28/22 1730 04/29/22 0042 04/29/22 0042 04/29/22 0238 04/29/22 0700 04/29/22 1058 04/30/22 0339 04/30/22 1400 04/30/22 1500 05/01/22 0000  HGB 10.5*  --   --   --  11.0*  --  11.1*  --   --   --   HCT 32.1*  --   --   --  33.7*  --  32.6*  --   --   --   PLT 404*  --   --   --  382  --  372  --   --   --   APTT  --  >200*   < > 34  --    < > 49*  --  57* 59*  HEPARINUNFRC  --  >1.10*  --  0.49  --   --   --  0.49  --   --   CREATININE 1.01  --   --   --  0.94  --  0.98  --   --   --    < > = values in this interval not displayed.     Estimated Creatinine Clearance: 57.1 mL/min (by C-G formula based on SCr of 0.98 mg/dL).   Medical History: Past Medical History:  Diagnosis Date   AICD (automatic cardioverter/defibrillator) present 02/11/2020   Anemia    Arthritis    Atrial tachycardia (HCC)    Chronic systolic CHF (congestive heart failure) ([place]) 07/04/2018   CKD (chronic kidney disease), stage III ([place])    Coronary artery disease 02/2012   a. s/p stenting in 2013  b. s/p CABGx2V (SVG--> PDA, SVG--> LCx).   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  Intolerant to PAP therapy   S/P TAVR (transcatheter aortic valve replacement) 05/15/2018   29 mm Edwards Sapien 3 transcatheter heart valve placed via percutaneous right transfemoral approach    Severe aortic stenosis     Medications:  Medications Prior to Admission  Medication Sig Dispense Refill Last Dose   acetaminophen (TYLENOL) 500 MG tablet Take 1,500 mg by mouth daily as needed for mild pain or headache.   04/27/2022   amitriptyline (ELAVIL) 25 MG tablet Take 25 mg by mouth at bedtime.    04/27/2022   B Complex-C (SUPER B COMPLEX PO) Take 1 tablet by  mouth daily.   Past Week   dapagliflozin propanediol (FARXIGA) 10 MG TABS tablet Take 1 tablet (10 mg total) by mouth daily. 90 tablet 3 Past Week   digoxin (LANOXIN) 0.125 MG tablet Take 1 tablet (0.125 mg total) by mouth daily. 30 tablet 3 Past Week   insulin detemir (LEVEMIR) 100 UNIT/ML injection Inject 10-24 Units into the skin See admin instructions. 20-24 units in the morning, 10 units at bedtime   Past Week   insulin lispro (HUMALOG) 100 UNIT/ML KwikPen Inject 0-4 Units into the skin See admin instructions. Per sliding scale at Breakfast and Dinner - pt could not tell me his sliding scale.   Past Week   levothyroxine (SYNTHROID, LEVOTHROID) 50 MCG tablet Take 50 mcg by mouth daily before breakfast.    Past Week   losartan (COZAAR) 25 MG tablet TAKE 1 TABLET(25 MG) BY MOUTH DAILY (Patient taking differently: Take 25 mg by mouth daily.) 90 tablet 3 Past Week   metFORMIN (GLUCOPHAGE) 500 MG tablet Take 2 tablets (1,000 mg total) by mouth 2 (two) times daily with a meal.   Past Week   metoprolol succinate (TOPROL-XL) 100 MG 24  hr tablet Take 0.5 tablets (50 mg total) by mouth daily. Take with or immediately following a meal. (Patient taking differently: Take 50 mg by mouth in the morning.)   04/26/2022 at 08:00   potassium chloride SA (KLOR-CON M) 20 MEQ tablet Take 1 tablet (20 mEq total) by mouth daily. 180 tablet 3 Past Week   rivaroxaban (XARELTO) 20 MG TABS tablet Take 1 tablet (20 mg total) by mouth daily with supper. (Patient taking differently: Take 20 mg by mouth daily.) 28 tablet 0 04/26/2022 at 08:00   spironolactone (ALDACTONE) 25 MG tablet TAKE 1 TABLET(25 MG) BY MOUTH DAILY (Patient taking differently: Take 25 mg by mouth daily.) 30 tablet 3 Past Week   torsemide (DEMADEX) 20 MG tablet Take 2 tablets (40 mg total) by mouth daily. 180 tablet 3 Past Week   zolpidem (AMBIEN) 5 MG tablet Take 5 mg by mouth at bedtime as needed for sleep.   04/27/2022   simvastatin (ZOCOR) 20 MG tablet Take 20 mg by mouth every evening.  (Patient not taking: Reported on 04/28/2022)   Not Taking   Infusions:   sodium chloride     sodium chloride     heparin 1,750 Units/hr (04/30/22 1738)   milrinone 0.25 mcg/kg/min (04/30/22 0542)    Assessment: Pharmacy consulted to dose heparin for h/o Afib,  while PTA Xarelto is on hold. Last Xarelto dose charted as 9/12 PM. Pt is s/p RHC today. Pt is being worked up for VAD. CBC is stable. Will continue to follow coag plans.   aPTT remains low at 59.  No known issues with IV infusion and no overt bleeding or complications noted per discussion with RN at this time.  Goal of Therapy:  Heparin level 0.3-0.7 units/ml aPTT 66-102 seconds Monitor platelets by anticoagulation protocol: Yes   Plan:  Increase heparin to 1850 units/hr Recheck aPTT in 6h Daily aPTT, heparin level and CBC.  Thank you for allowing pharmacy to participate in this patient's care.  Alanda Slim, PharmD, [facility] Clinical Pharmacist Please see AMION for all Pharmacists' Contact Phone Numbers 05/01/2022, 1:57 AM

## 2022-05-02 DIAGNOSIS — E43 Unspecified severe protein-calorie malnutrition: Secondary | ICD-10-CM

## 2022-05-02 DIAGNOSIS — Z515 Encounter for palliative care: Secondary | ICD-10-CM | POA: Diagnosis not present

## 2022-05-02 DIAGNOSIS — I5023 Acute on chronic systolic (congestive) heart failure: Secondary | ICD-10-CM | POA: Diagnosis not present

## 2022-05-02 DIAGNOSIS — C3490 Malignant neoplasm of unspecified part of unspecified bronchus or lung: Secondary | ICD-10-CM

## 2022-05-02 LAB — BASIC METABOLIC PANEL
Anion gap: 12 (ref 5–15)
BUN: 30 mg/dL — ABNORMAL HIGH (ref 8–23)
CO2: 26 mmol/L (ref 22–32)
Calcium: 9.2 mg/dL (ref 8.9–10.3)
Chloride: 95 mmol/L — ABNORMAL LOW (ref 98–111)
Creatinine, Ser: 1 mg/dL (ref 0.61–1.24)
GFR, Estimated: >60 mL/min (ref >60)
Glucose, Bld: 264 mg/dL — ABNORMAL HIGH (ref 70–99)
Potassium: 4.1 mmol/L (ref 3.5–5.1)
Sodium: 133 mmol/L — ABNORMAL LOW (ref 135–145)

## 2022-05-02 LAB — COOXEMETRY PANEL
Carboxyhemoglobin: 2.8 % — ABNORMAL HIGH (ref 0.5–1.5)
Methemoglobin: <0.7 % (ref 0.0–1.5)
O2 Saturation: 61.1 %
Total hemoglobin: 10.8 g/dL — ABNORMAL LOW (ref 12.0–16.0)

## 2022-05-02 LAB — GLUCOSE, CAPILLARY
Glucose-Capillary: 431 mg/dL — ABNORMAL HIGH (ref 70–99)
Glucose-Capillary: 546 mg/dL (ref 70–99)
Glucose-Capillary: 297 mg/dL — ABNORMAL HIGH (ref 70–99)
Glucose-Capillary: 171 mg/dL — ABNORMAL HIGH (ref 70–99)
Glucose-Capillary: 184 mg/dL — ABNORMAL HIGH (ref 70–99)

## 2022-05-02 LAB — CBC
HCT: 32.1 % — ABNORMAL LOW (ref 39.0–52.0)
Hemoglobin: 10.7 g/dL — ABNORMAL LOW (ref 13.0–17.0)
MCH: 23.3 pg — ABNORMAL LOW (ref 26.0–34.0)
MCHC: 33.3 g/dL (ref 30.0–36.0)
MCV: 69.8 fL — ABNORMAL LOW (ref 80.0–100.0)
Platelets: 351 10*3/uL (ref 150–400)
RBC: 4.6 MIL/uL (ref 4.22–5.81)
RDW: 18 % — ABNORMAL HIGH (ref 11.5–15.5)
WBC: 7.7 10*3/uL (ref 4.0–10.5)
nRBC: 0 % (ref 0.0–0.2)

## 2022-05-02 LAB — APTT: aPTT: 58 seconds — ABNORMAL HIGH (ref 24–36)

## 2022-05-02 LAB — HEPARIN LEVEL (UNFRACTIONATED): Heparin Unfractionated: 0.59 IU/mL (ref 0.30–0.70)

## 2022-05-02 MED ORDER — INSULIN ASPART 100 UNIT/ML IJ SOLN
20.0000 [IU] | Freq: Once | INTRAMUSCULAR | Status: AC
  Administered 2022-05-02: 20 [IU] via SUBCUTANEOUS

## 2022-05-02 MED ORDER — TORSEMIDE 20 MG PO TABS
20.0000 mg | ORAL_TABLET | Freq: Every day | ORAL | Status: DC
  Administered 2022-05-02 – 2022-05-03 (×2): 20 mg via ORAL
  Filled 2022-05-02 (×2): qty 1

## 2022-05-02 MED ORDER — INSULIN ASPART 100 UNIT/ML IJ SOLN
5.0000 [IU] | Freq: Three times a day (TID) | INTRAMUSCULAR | Status: DC
  Administered 2022-05-02 – 2022-05-05 (×10): 5 [IU] via SUBCUTANEOUS

## 2022-05-02 MED ORDER — INSULIN DETEMIR 100 UNIT/ML ~~LOC~~ SOLN
10.0000 [IU] | Freq: Two times a day (BID) | SUBCUTANEOUS | Status: DC
  Administered 2022-05-02 – 2022-05-03 (×3): 10 [IU] via SUBCUTANEOUS
  Filled 2022-05-02 (×4): qty 0.1

## 2022-05-02 MED ORDER — MILRINONE LACTATE IN DEXTROSE 20-5 MG/100ML-% IV SOLN
0.1250 ug/kg/min | INTRAVENOUS | Status: DC
  Administered 2022-05-02: 0.125 ug/kg/min via INTRAVENOUS

## 2022-05-02 MED ORDER — MILRINONE LACTATE IN DEXTROSE 20-5 MG/100ML-% IV SOLN: 0.2500 ug/kg/min | INTRAVENOUS | Status: DC

## 2022-05-02 MED ORDER — POLYETHYLENE GLYCOL 3350 17 G PO PACK
17.0000 g | PACK | Freq: Every day | ORAL | Status: DC | PRN
  Administered 2022-05-02 – 2022-05-08 (×7): 17 g via ORAL
  Filled 2022-05-02 (×7): qty 1

## 2022-05-02 MED ORDER — RIVAROXABAN 20 MG PO TABS
20.0000 mg | ORAL_TABLET | Freq: Every day | ORAL | Status: DC
  Administered 2022-05-02 – 2022-05-08 (×7): 20 mg via ORAL
  Filled 2022-05-02 (×7): qty 1

## 2022-05-02 MED ORDER — DOCUSATE SODIUM 100 MG PO CAPS
100.0000 mg | ORAL_CAPSULE | Freq: Every day | ORAL | Status: DC | PRN
  Administered 2022-05-02 – 2022-05-04 (×3): 100 mg via ORAL
  Filled 2022-05-02 (×3): qty 1

## 2022-05-02 NOTE — Progress Notes (Addendum)
Advanced Heart Failure Rounding Note  PCP-Cardiologist: Fransico Him, MD   Subjective:   09/17: milrinone decreased to 0.125. Sinus tach vs atrial flutter on telemetry. Started IV amio. Ivabradine stopped.  CO-OX 61% this am on milrinone 0.125. CVP 8.  Rhythm sinus/sinus tach 90s-100s this am. Continues on IV amio at 30/hr.  Continues with back pain but reports relief with tramadol.  Seen by Palliative Care and now DNR/DNI but wants aggressive care if he is a candidate.  Dr. Valeta Harms consulted. Planning to discuss with Dr. Earlie Server with Oncology to see if candidate for systemic treatments before considering anesthesia for tissue biopsy.    Chest CT 1. Significant interval enlargement of bulky, likely necrotic left hilar mass/lymphadenopathy. 2. Multiple new pulmonary parenchymal and pleural masses and nodules throughout the left lung, consistent with worsened pulmonary and pleural metastatic disease.  R/LHC 04/28/22  RA 12 PA 61/26 (39) PCWP 20 (v 27) Fick CO/CI 3.1/1.7 PVR 6.1 PAPi 2.9 PA sat 45%, 47% 3 v CAD with chronically occluded RCA and LCX. Patent SVGs to R PDA and OM. 90% p and 95% d LAD.     Objective:   Weight Range: 61.2 kg Body mass index is 19.37 kg/m.   Vital Signs:   Temp:  [97.7 F (36.5 C)-98.1 F (36.7 C)] 98.1 F (36.7 C) (09/18 0552) Pulse Rate:  [101-127] 101 (09/18 0552) Resp:  [18-27] 21 (09/18 0618) BP: (96-114)/(53-77) 96/62 (09/18 0552) SpO2:  [98 %-100 %] 100 % (09/18 0552) Weight:  [61.2 kg] 61.2 kg (09/18 0100) Last BM Date : 04/30/22  Weight change: Filed Weights   04/30/22 0456 05/01/22 0547 05/02/22 0100  Weight: 59.2 kg 60.5 kg 61.2 kg    Intake/Output:   Intake/Output Summary (Last 24 hours) at 05/02/2022 0742 Last data filed at 05/02/2022 0706 Gross per 24 hour  Intake 1723.9 ml  Output 4150 ml  Net -2426.1 ml      Physical Exam    CVP 8 General:  Appears fatigued. Cachectic.  HEENT: normal Neck:  supple. JVP 7-8. Carotids 2+ bilat; no bruits.  Cor: PMI nondisplaced. Regular rate & rhythm, tachy. No rubs, gallops or murmurs. Lungs: scattered expiratory wheezes Abdomen: soft, nontender, nondistended.  Extremities: no cyanosis, clubbing, rash, edema Neuro: alert & orientedx3, cranial nerves grossly intact. moves all 4 extremities w/o difficulty. Affect pleasant    Telemetry   Sinus/sinus tach 90s-100s  Labs    CBC Recent Labs    05/01/22 0235 05/02/22 0450  WBC 7.7 7.7  HGB 10.6* 10.7*  HCT 31.9* 32.1*  MCV 70.4* 69.8*  PLT 379 099   Basic Metabolic Panel Recent Labs    05/01/22 0235 05/02/22 0450  NA 134* 133*  K 4.1 4.1  CL 98 95*  CO2 25 26  GLUCOSE 189* 264*  BUN 27* 30*  CREATININE 0.84 1.00  CALCIUM 9.2 9.2   Liver Function Tests No results for input(s): "AST", "ALT", "ALKPHOS", "BILITOT", "PROT", "ALBUMIN" in the last 72 hours. No results for input(s): "LIPASE", "AMYLASE" in the last 72 hours. Cardiac Enzymes No results for input(s): "CKTOTAL", "CKMB", "CKMBINDEX", "TROPONINI" in the last 72 hours.  BNP: BNP (last 3 results) Recent Labs    03/08/22 1114 03/30/22 1028 04/27/22 1251  BNP 1,980.1* 1,880.7* 1,466.6*    ProBNP (last 3 results) No results for input(s): "PROBNP" in the last 8760 hours.   D-Dimer No results for input(s): "DDIMER" in the last 72 hours. Hemoglobin A1C No results for input(s): "HGBA1C" in the  last 72 hours. Fasting Lipid Panel No results for input(s): "CHOL", "HDL", "LDLCALC", "TRIG", "CHOLHDL", "LDLDIRECT" in the last 72 hours. Thyroid Function Tests No results for input(s): "TSH", "T4TOTAL", "T3FREE", "THYROIDAB" in the last 72 hours.  Invalid input(s): "FREET3"  Other results:   Imaging    No results found.   Medications:     Scheduled Medications:  (feeding supplement) PROSource Plus  30 mL Oral BID BM   amitriptyline  25 mg Oral QHS   Chlorhexidine Gluconate Cloth  6 each Topical Daily    dapagliflozin propanediol  10 mg Oral Daily   digoxin  0.0625 mg Oral Daily   feeding supplement  237 mL Oral TID BM   insulin aspart  0-15 Units Subcutaneous TID WC   insulin aspart  20 Units Subcutaneous Once   insulin detemir  10 Units Subcutaneous BID   levothyroxine  50 mcg Oral QAC breakfast   losartan  12.5 mg Oral Daily   multivitamin with minerals  1 tablet Oral Daily   simvastatin  20 mg Oral QPM   sodium chloride flush  10-40 mL Intracatheter Q12H   sodium chloride flush  3 mL Intravenous Q12H   sodium chloride flush  3 mL Intravenous Q12H   spironolactone  25 mg Oral Daily    Infusions:  sodium chloride     sodium chloride     amiodarone 30 mg/hr (05/02/22 0425)   heparin 2,250 Units/hr (05/02/22 0630)   milrinone 0.125 mcg/kg/min (05/02/22 0431)    PRN Medications: sodium chloride, sodium chloride, acetaminophen, sodium chloride flush, sodium chloride flush, sodium chloride flush, traMADol, zolpidem    Patient Profile   71 y/o male with chronic systolic HF due to ICM with several weeks progressive HF symptoms. Presented 9/14 for outpatient [facility] which demonstrated low cardiac output Fick CO/CI 3.1/1.7. Admitted from cath lab for HF optimization w/ initiation of milrinone and VAD w/u.   Assessment/Plan   Chronic Systolic Heart Failure: - s/p [employer] Jude CRT-D 01/2020. Unable to place LV lead. Interestingly, he had a RBBB during a recent admission and had LBBB on prior ECGs. Discussed with Dr. Lovena Le, with current RBBB a left bundle lead will not be likely to help.  - Had PYP 2020 not suggestive of amyloid.  - Echo (1/23): EF 20-25%, RV significantly reduced, RVSP 50 mmHg, moderate MR, moderate TR, mean gradient 2 mmHg across aortic valve prosthesis. - CPX (3/23): moderate to severe HF limitation. - R/LHC 9/14: RA 12, PA mean 39, PCWP 20, CI 1.7.  - CO-OX 61% on milrinone 0.125. Will keep on low-dose milrinone for now pending decision on further workup. - CVP 8.  Received po Torsemide 40 mg yesterday, start 20 mg daily. - Continue Farxiga 10 mg daily.  - Continue digoxin 0.0625 mg daily, reduced on admit d/t dig level of 0.9. Check level tomorrow am. - Continue losartan 12.5 mg daily (failed Entresto in past due to low BP).  - Continue spironolactone 25 mg daily. - off ? blocker due to low output  - off ivabradine with concern for atrial flutter - Admitted for VAD w/u but CT chest shows progressive metastatic lung CA. Work-up as below.  - Not candidate for VAD. He is end-stage. Palliative care consulted. He is now DNR/DNI. Has stage IV squamous cell lung cancer. See below.   2. CAD - h/o CABG 2013 - LHC this adm: patent SVGs to R PDA and OM. 90% p and 95% d LAD - No s/s angina. Continue medical  rx.  - No ASA with A/C. - Continue simvastatin 20 mg daily. LDL ok at 65 mg/dL    3. Paroxysmal Afib/?atrial flutter - Remains in NSR but rate up with milrinone (see discussion above). ? Atrial flutter on tele yesterday. - CHA2DS2-VASc score is 5 - Rhythm now appears sinus tach.  - Stopping IV milrinone today. Convert IV amio to po once off inotrope support. - Now on heparin in case need procedures. Eventually switch back to Eliquis. - Continue CPAP   4. DM2 - Insulin dependent. - Hgb A1C 9.6  - On SGLT2i. - Now hyperglycemic. Blood glucose 400s. On SSI. Gave 20 u Novolog and started levemir 10 u BID (takes levemir at home). - Consulted diabetes coordinator. - Managed by PCP.   5. OSA - Continue CPAP.   6. Aortic valve stenosis - S/P TAVR 2019 with 29 mm S3 - Valve okay on echo 01/23   7. Hypothyroid - On levothyroxine.    8. 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 Stage IV Lung CA, squamous cell - previously found to have a 3 cm mass in the superior segment of left lower lobe which  was biopsy-proven to be squamous cell carcinoma. - He had some mild mediastinal adenopathy without significant activity on PET scan.   - He was not felt to be candidate for lobectomy due to his severe cardiac disease and underwent SBRT. - chest CT concerning for worsening metastatic lung CA - large 5 cm LLL mass and 3.5 cm pleural-based mass. Bulky necrotic appearing adenopathy. - PET would likely not be approved to be obtained while inpatient. - Dr. Valeta Harms consulted and is planning to discuss with Dr. Earlie Server with Oncology to see if candidate for systemic treatments before considering anesthesia for tissue biopsy.   Length of Stay: 4  [facility], Shannon, PA-C  05/02/2022, 7:42 AM  Advanced Heart Failure Team Pager [phone] (M-F; 7a - 5p)  Please contact [facility] for night-coverage after hours (5p -7a ) and weekends on [email or web address]  Patient seen and examined with the above-signed Advanced Practice Provider and/or Housestaff. I personally reviewed laboratory data, imaging studies and relevant notes. I independently examined the patient and formulated the important aspects of the plan. I have edited the note to reflect any of my changes or salient points. I have personally discussed the plan with the patient and/or family.  Remains on milrinone and IV amio. Co-ox 61% CVP 8. Breathing feels better. Less tachycardic.   Pulmonary and Thoracic Oncology teams have met and feel he is not candidate for chemotherapy and thus biopsy of lung massess not being pursued.   General:  Cachetic weak  appearing. No resp difficulty HEENT: normal Neck: supple. JVP 8-9 . Carotids 2+ bilat; no bruits. No lymphadenopathy or thryomegaly appreciated. Cor: PMI nondisplaced. Regular rate & rhythm. +s3 Lungs: crackles left bases Abdomen: soft, nontender, nondistended. No hepatosplenomegaly. No bruits or masses. Good bowel sounds. Extremities: no cyanosis, clubbing, rash, edema Neuro: alert & orientedx3, cranial  nerves grossly intact. moves all 4 extremities w/o difficulty. Affect pleasant  He has end-stage HF and now with recurrent, metastatic lung cancer. Not candidate for chemotherapy or advanced HF therapies. Palliative Care remains the only option.   Will stop milrinone, IV amio and heparin. Restart Xarelto.   Palliative Care team present in room for  discussion with patient and his son. Appreciate their assistance.   Glori Bickers, MD  2:33 PM

## 2022-05-02 NOTE — Progress Notes (Signed)
   05/02/22 0800  Assess: MEWS Score  Temp 97.7 F (36.5 C)  BP 101/62  MAP (mmHg) 75  ECG Heart Rate 99  Resp 15  Level of Consciousness Alert  SpO2 100 %  O2 Device Room Air  Assess: MEWS Score  MEWS Temp 0  MEWS Systolic 0  MEWS Pulse 0  MEWS RR 0  MEWS LOC 0  MEWS Score 0  MEWS Score Color Green  Assess: if the MEWS score is Yellow or Red  Were vital signs taken at a resting state? Yes  Focused Assessment No change from prior assessment  Treat  Pain Scale 0-10  Pain Score 7  Pain Type Chronic pain  Pain Location Back  Pain Orientation Lower  Pain Frequency Constant  Pain Onset On-going  Pain Intervention(s) Medication (See eMAR)  Notify: Charge Nurse/RN  Name of Charge Nurse/RN Notified [place]  Patient Outcome Stabilized after interventions  Progress note created (see row info) Yes  Assess: SIRS CRITERIA  SIRS Temperature  0  SIRS Pulse 1  SIRS Respirations  0  SIRS WBC 1  SIRS Score Sum  2   Chronic High Heart Rate.

## 2022-05-02 NOTE — Progress Notes (Signed)
Patient ID: [name], male   DOB: Aug 28, 1950, 71 y.o.   MRN: [id number]    Progress Note from the Palliative Medicine Team at [facility]   Patient Name: [name]        Date: 05/02/2022 DOB: 1951/04/10  Age: 71 y.o. MRN#: [id number] Attending Physician: Jolaine Artist, MD Primary Care Physician: [name], MD Admit Date: 04/28/2022   Medical records reviewed   71 y/o male admitted for dyspnea due to advanced heart failure, consideration of VAD placement, found to have lung masses with lymphadenopathy worrisome for recurrent malignancy.  Multiple co-morbidities AICD,Anemia,Atrial fibrillation, CKD, Systolic heart failure, CAD, Hyperlidpiemia, Hypertension, OSA S/p TAVR, Severe aortic stenosis, DM2, GERD, History of stroke Squamous cell carcinoma of left lung lower lobe   This NP assessed patient at the bedside as a follow up for palliative medicine needs and emotional support.  Dr Haroldine Laws just finished talking with [name] and his son/[name] regarding the seriousness of his current medical situation, limitations of viable treatment options and recommendation for palliative/hospice approach.  Created space and opportunity for patient to explore his thoughts and feelings regarding his current medical situation.  He verbalizing acceptance of his limited prognosis.  He remains hopeful for more quality time.  Plan is to meet tomorrow morning at 0900 am with patient's two sons hoping to discuss a discharge plan.  Patient has limited home support.  I offered education to patient and his son regarding hospice benefit; philosophy and eligibility.   Questions and concerns addressed  Education offered today regarding  the importance of continued conversation with family and the medical providers regarding overall plan of care and treatment options,  ensuring decisions are within the context of the patients values and GOCs.  Questions and concerns addressed   Discussed  with bedside RN   Wadie Lessen NP  Palliative Medicine Team Team Phone # [phone] Pager [phone]

## 2022-05-02 NOTE — Care Management Important Message (Signed)
Important Message  Patient Details  Name: [name] MRN: [id number] Date of Birth: 10/03/1950   Medicare Important Message Given:  Yes     [name] 05/02/2022, 8:48 AM

## 2022-05-02 NOTE — Progress Notes (Signed)
   NAME:  [name], MRN:  [id number], DOB:  1951-02-09, LOS: 4 ADMISSION DATE:  04/28/2022, CONSULTATION DATE:  9/17 REFERRING MD:  Bensimhon, CHIEF COMPLAINT:  Lung mass   History of Present Illness:  71 y/o male admitted for dyspnea due to advanced heart failure, consideration of VAD placement, found to have lung masses with lymphadenopathy worrisome for recurrent malignancy.  Pertinent  Medical History  AICD Anemia Atrial fibrillation CKD Systolic heart failure CAD Hyperlidpiemia Hypertension OSA S/p TAVR Severe aortic stenosis DM2 GERD History of stroke Squamous cell carcinoma of left lower lobe  Significant Hospital Events: Including procedures, antibiotic start and stop dates in addition to other pertinent events   9/15 CT chest/ab/pelv > multiple masses in left lung, trace effusion left lung, lymphadenopathy   Interim History / Subjective:  Feels about the same  Objective   Blood pressure 101/62, pulse (!) 101, temperature 97.7 F (36.5 C), temperature source Oral, resp. rate 15, height 5\' 10"  (1.778 m), weight 61.2 kg, SpO2 100 %. CVP:  [8 mmHg-10 mmHg] 8 mmHg      Intake/Output Summary (Last 24 hours) at 05/02/2022 1122 Last data filed at 05/02/2022 1049 Gross per 24 hour  Intake 1543.9 ml  Output 4800 ml  Net -3256.1 ml   Filed Weights   04/30/22 0456 05/01/22 0547 05/02/22 0100  Weight: 59.2 kg 60.5 kg 61.2 kg    Examination: General:  Resting comfortably in bed HENT: NCAT OP clear PULM: Wheezing bilaterally, normal effort CV: RRR, no mgr GI: BS+, soft, nontender MSK: normal bulk and tone Neuro: awake, alert, no distress, MAEW   Resolved Hospital Problem list     Assessment & Plan:  Squamous cell lung cancer s/p XRT New multiple lung masses with lymphadenopathy worrisome for recurrent malignancy Advanced systolic heart failure, end stage Atrial fibrillation  Discussion: Dr. Julien Nordmann and Dr. Valeta Harms discussed [name]'s condition and  feel that systemic cancer treatment (chemotherapy or immunotherapy) would be more harmful than helpful considering the advanced nature of his underlying cardiac diease.    Plan: At this time no plans for biopsy/tissue sampling of left lung masses as there is no available treatment Should his cardiac condition improve then this could be reconsidered DNR code status noted Agree with management from HF service  PCCM available PRN  Patient, son and daughter in law updated bedside   Critical care time: n/a    Roselie Awkward, MD [place] [facility] Pager: [phone] Cell: [phone] After 7:00 pm call [place]  [phone]

## 2022-05-02 NOTE — Inpatient Diabetes Management (Signed)
Inpatient Diabetes Program Recommendations  AACE/ADA: New Consensus Statement on Inpatient Glycemic Control (2015)  Target Ranges:  Prepandial:   less than 140 mg/dL      Peak postprandial:   less than 180 mg/dL (1-2 hours)      Critically ill patients:  140 - 180 mg/dL   Lab Results  Component Value Date   GLUCAP 431 (H) 05/02/2022   HGBA1C 9.6 (H) 04/29/2022    Review of Glycemic Control  Latest Reference Range & Units 05/01/22 05:31 05/01/22 11:20 05/01/22 15:36 05/01/22 17:24 05/01/22 19:44 05/01/22 21:28 05/02/22 06:39 05/02/22 07:27  Glucose-Capillary 70 - 99 mg/dL 216 (H) 296 (H) 434 (H) 416 (H) 231 (H) 191 (H) 546 (HH) 431 (H)   Diabetes history: DM 2 Outpatient Diabetes medications: Farxiga 10 mg Daily, Levemir 20-24 units qam, 10 units qpm, Humalog 0-4 units bid, metformin 1000 mg bid Current orders for Inpatient glycemic control:  Levemir 10 units bid Novolog 0-15 units tid Farxiga 10 mg Daily  Ensure Enlive tid between meals (44 grams of carbs)  Inpatient Diabetes Program Recommendations:    Note Levemir 10 units bid started today  -  Consider adding Novolog 5 units tid meal coverage if eating >50% of meals to prevent post prandial spikes.  Thanks,  Tama Headings RN, MSN, BC-ADM Inpatient Diabetes Coordinator Team Pager [phone] (8a-5p)

## 2022-05-02 NOTE — Progress Notes (Signed)
IR was requested for lung mass bx, case was reviewed by Dr. Kathlene Cote, percutaneous biopsy be challenging and high risk for complications.  Recommended pulmonary consult for bronch with bx.   Pulmonary consulted, bronch with biopsy carries high risk as well, especially with GA.  Pulm reached out to oncology and decision was made that no plans for biopsy/tissue sampling of left lung masses as there is no available treatment.   Will cancel the IR rad eval order.  Please call IR for questions and concerns.   Armando Gang Myeshia Fojtik PA-C 05/02/2022 12:11 PM

## 2022-05-02 NOTE — Progress Notes (Signed)
Physical Therapy Treatment Patient Details Name: [name] MRN: [id number] DOB: 1951/05/07 Today's Date: 05/02/2022   History of Present Illness Patient is a 71 yo male admitted on 04/28/22 with shortness of breath, CHF exacerbation. Mult admissions this year for same.  Pt works as Forensic psychologist for [employer].   PMH includes: CAD, CABG, chronic systolic CHF EF of 81%, status post AICD, aortic stenosis status post TAVR, remote history of squamous cell carcinoma of the left lower lobe status postradiation, CKD stage IIIb, type 2 diabetes, aortic aneurysm, hypertension, CVA, OSA not on CPAP.    PT Comments    Pt was assisted to do there exercise on LE's after beign OOB in chair this AM.  He is motivated and agreed to bed ex, but is now walking with minor assist.  Have recommended him to go home with family support, and pt is asking about a return to work.  Will need medical advice for this information, but PT will focus again on simple mobility and independence with balance control.  Encourage OOB in chair and standing/walking as tolerated with staff and rehab.  Recommendations for follow up therapy are one component of a multi-disciplinary discharge planning process, led by the attending physician.  Recommendations may be updated based on patient status, additional functional criteria and insurance authorization.  Follow Up Recommendations  Home health PT     Assistance Recommended at Discharge PRN  Patient can return home with the following Assistance with cooking/housework;Assist for transportation   Equipment Recommendations  None recommended by PT    Recommendations for Other Services       Precautions / Restrictions Precautions Precautions: Fall Precaution Comments: monitor HR - tacycardia at rest, SOB with exertion Restrictions Weight Bearing Restrictions: No     Mobility  Bed Mobility               General bed mobility comments: declined to get up to side of bed, had  just returned to bed    Transfers                   General transfer comment: in bed    Ambulation/Gait                   Stairs             Wheelchair Mobility    Modified Rankin (Stroke Patients Only)       Balance                                            Cognition Arousal/Alertness: Awake/alert Behavior During Therapy: WFL for tasks assessed/performed Overall Cognitive Status: Within Functional Limits for tasks assessed                                          Exercises General Exercises - Lower Extremity Ankle Circles/Pumps: AROM, 5 reps Quad Sets: AROM, 10 reps Gluteal Sets: AROM, 10 reps Heel Slides: AROM, 10 reps Hip ABduction/ADduction: AROM, 10 reps    General Comments General comments (skin integrity, edema, etc.): Pt was in some back pain and assisted him to replace the k pad with cotton linen overlying it      Pertinent Vitals/Pain Pain Assessment Pain Assessment: Faces Faces Pain Scale: Hurts little  more Pain Location: upper back and low back Pain Descriptors / Indicators: Grimacing, Guarding Pain Intervention(s): Limited activity within patient's tolerance, Premedicated before session, Repositioned, Monitored during session, Heat applied (replaced K pad)    Home Living                          Prior Function            PT Goals (current goals can now be found in the care plan section) Acute Rehab PT Goals Patient Stated Goal: to get back to work    Frequency    Min 3X/week      PT Plan Discharge plan needs to be updated    Co-evaluation              AM-PAC PT "6 Clicks" Mobility   Outcome Measure  Help needed turning from your back to your side while in a flat bed without using bedrails?: None Help needed moving from lying on your back to sitting on the side of a flat bed without using bedrails?: None Help needed moving to and from a bed to a chair  (including a wheelchair)?: None Help needed standing up from a chair using your arms (e.g., wheelchair or bedside chair)?: A Little Help needed to walk in hospital room?: A Little Help needed climbing 3-5 steps with a railing? : A Little 6 Click Score: 21    End of Session Equipment Utilized During Treatment: Other (comment) (k pad) Activity Tolerance: Patient limited by fatigue;Patient limited by pain Patient left: in bed;with call bell/phone within reach Nurse Communication: Mobility status PT Visit Diagnosis: Unsteadiness on feet (R26.81);Pain;Difficulty in walking, not elsewhere classified (R26.2) Pain - part of body:  (back)     Time: 4782-9562 PT Time Calculation (min) (ACUTE ONLY): 13 min  Charges:  $Therapeutic Exercise: 8-22 mins     Ramond Dial 05/02/2022, 4:11 PM  Mee Hives, PT PhD Acute Rehab Dept. Number: [place] [facility] [place]

## 2022-05-02 NOTE — Progress Notes (Signed)
No plans for biopsy/tissue sampling of left lung mass. Pulmonary and Oncology reviewed case, it was felt cancer treatment would pose more harm than benefit given advanced cardiac disease.  Will stop milrinone. Follow CO-OX.

## 2022-05-02 NOTE — Plan of Care (Signed)
  Problem: Education: Goal: Understanding of CV disease, CV risk reduction, and recovery process will improve Outcome: Progressing   Problem: Activity: Goal: Ability to return to baseline activity level will improve Outcome: Progressing   Problem: Health Behavior/Discharge Planning: Goal: Ability to safely manage health-related needs after discharge will improve Outcome: Progressing  Education given on treatment plan and how to manage blood sugar, Heart failure, and Home health/palliative care ordered for discharge

## 2022-05-02 NOTE — Progress Notes (Signed)
ANTICOAGULATION CONSULT NOTE  Pharmacy Consult for heparin Indication: atrial fibrillation  Allergies  Allergen Reactions   Entresto [Sacubitril-Valsartan] Other (See Comments)    Hypotension    Lipitor [Atorvastatin] Other (See Comments)    Myalgias    Adhesive [Tape] Rash    Patient Measurements: Height: 5\' 10"  (177.8 cm) Weight: 61.2 kg (135 lb) IBW/kg (Calculated) : 73 Heparin Dosing Weight: 60kg  Vital Signs: Temp: 97.8 F (36.6 C) (09/18 0100) Temp Source: Axillary (09/18 0100) BP: 97/64 (09/18 0100) Pulse Rate: 101 (09/18 0100)  Labs: Recent Labs    04/29/22 0700 04/29/22 1058 04/30/22 0339 04/30/22 1400 04/30/22 1500 05/01/22 0235 05/01/22 1224 05/01/22 1954 05/02/22 0450  HGB 11.0*  --  11.1*  --   --  10.6*  --   --  10.7*  HCT 33.7*  --  32.6*  --   --  31.9*  --   --  32.1*  PLT 382  --  372  --   --  379  --   --  351  APTT  --    < > 49*  --    < > 47* 60* 49* 58*  HEPARINUNFRC  --   --   --  0.49  --  0.34  --   --  0.59  CREATININE 0.94  --  0.98  --   --  0.84  --   --   --    < > = values in this interval not displayed.     Estimated Creatinine Clearance: 68.8 mL/min (by C-G formula based on SCr of 0.84 mg/dL).   Medical History: Past Medical History:  Diagnosis Date   AICD (automatic cardioverter/defibrillator) present 02/11/2020   Anemia    Arthritis    Atrial tachycardia (HCC)    Chronic systolic CHF (congestive heart failure) ([place]) 07/04/2018   CKD (chronic kidney disease), stage III ([place])    Coronary artery disease 02/2012   a. s/p stenting in 2013  b. s/p CABGx2V (SVG--> PDA, SVG--> LCx).   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  Intolerant to PAP therapy   S/P TAVR (transcatheter aortic valve replacement) 05/15/2018   29 mm Edwards Sapien 3 transcatheter heart valve placed via percutaneous right transfemoral approach    Severe aortic stenosis     Medications:  Medications Prior to Admission  Medication Sig Dispense Refill Last Dose   acetaminophen (TYLENOL) 500 MG tablet Take 1,500 mg by mouth daily as needed for mild pain or headache.   04/27/2022   amitriptyline (ELAVIL) 25 MG tablet Take 25 mg by mouth at bedtime.    04/27/2022   B Complex-C (SUPER B COMPLEX PO) Take 1 tablet by mouth daily.   Past Week   dapagliflozin propanediol (FARXIGA) 10 MG TABS tablet Take 1 tablet (10 mg total) by mouth daily. 90 tablet 3 Past  Week   digoxin (LANOXIN) 0.125 MG tablet Take 1 tablet (0.125 mg total) by mouth daily. 30 tablet 3 Past Week   insulin detemir (LEVEMIR) 100 UNIT/ML injection Inject 10-24 Units into the skin See admin instructions. 20-24 units in the morning, 10 units at bedtime   Past Week   insulin lispro (HUMALOG) 100 UNIT/ML KwikPen Inject 0-4 Units into the skin See admin instructions. Per sliding scale at Breakfast and Dinner - pt could not tell me his sliding scale.   Past Week   levothyroxine (SYNTHROID, LEVOTHROID) 50 MCG tablet Take 50 mcg by mouth daily before breakfast.    Past Week   losartan (COZAAR) 25 MG tablet TAKE 1 TABLET(25 MG) BY MOUTH DAILY (Patient taking differently: Take 25 mg by mouth daily.) 90 tablet 3 Past Week   metFORMIN (GLUCOPHAGE) 500 MG tablet Take 2 tablets (1,000 mg total) by mouth 2 (two) times daily with a meal.   Past Week   metoprolol succinate (TOPROL-XL) 100 MG 24 hr tablet Take 0.5 tablets (50 mg total) by mouth  daily. Take with or immediately following a meal. (Patient taking differently: Take 50 mg by mouth in the morning.)   04/26/2022 at 08:00   potassium chloride SA (KLOR-CON M) 20 MEQ tablet Take 1 tablet (20 mEq total) by mouth daily. 180 tablet 3 Past Week   rivaroxaban (XARELTO) 20 MG TABS tablet Take 1 tablet (20 mg total) by mouth daily with supper. (Patient taking differently: Take 20 mg by mouth daily.) 28 tablet 0 04/26/2022 at 08:00   spironolactone (ALDACTONE) 25 MG tablet TAKE 1 TABLET(25 MG) BY MOUTH DAILY (Patient taking differently: Take 25 mg by mouth daily.) 30 tablet 3 Past Week   torsemide (DEMADEX) 20 MG tablet Take 2 tablets (40 mg total) by mouth daily. 180 tablet 3 Past Week   zolpidem (AMBIEN) 5 MG tablet Take 5 mg by mouth at bedtime as needed for sleep.   04/27/2022   simvastatin (ZOCOR) 20 MG tablet Take 20 mg by mouth every evening.  (Patient not taking: Reported on 04/28/2022)   Not Taking   Infusions:   sodium chloride     sodium chloride     amiodarone 30 mg/hr (05/02/22 0425)   heparin 2,150 Units/hr (05/02/22 0434)   milrinone 0.125 mcg/kg/min (05/02/22 0431)    Assessment: Pharmacy consulted to dose heparin for h/o Afib, while PTA Xarelto is on hold. Last Xarelto dose charted as 9/12 PM. Pt is s/p RHC. CBC is stable.   Patient was found to have large mass with bulky necrotic hilar adenopathy. Patient is now DNR/DNI. Remains NSR but rate is up with milrinone. Will continue to follow coag plans.   PTT came back subtherapeutic this am.   Goal of Therapy:  Heparin level 0.3-0.7 units/ml aPTT 66-102 seconds Monitor platelets by anticoagulation protocol: Yes   Plan:  Increase heparin at 2250 units/hr Recheck aPTT/HL in 6 hours Daily aPTT, heparin level and CBC  Alanda Slim, PharmD, [facility] Clinical Pharmacist Please see AMION for all Pharmacists' Contact Phone Numbers 05/02/2022, 5:43 AM

## 2022-05-02 NOTE — Discharge Summary (Incomplete)
Advanced Heart Failure Team  Discharge Summary   Patient ID: [name] MRN: [id number], DOB/AGE: [birth year] 71 y.o. Admit date: 04/28/2022 D/C date:     05/09/2022   Primary Discharge Diagnoses:  1. Acute on chronic HFrEF 2. CAD 3. PAF 4. DMII 5. OSA 6. Aortic Valve Stenosis/H/O TAVR  7. Hypothyroid 8. CKD Stage IIIa 9. H/O Germ Cell Tumor of Mediastinum  10. Stage IV Lung Cancer, Squamous Cell  11. Severe protein-calorie malnutrition 11. DNR/DNI   Hospital Course:   [name] is a 70 y.o. with a history of squamous cell lung cancer LLL 1985, CAD, CABG x2 2013, aortic stenosis s/p TAVR 2019, LBBB, St Jude CRT-D (unable to place LV lead), HTN, OSA, CKD Stage IIIa, hypothyroidism, and HFrEF.  Admitted in January and February of this year with A/C HFrEF. Had CPX test 03/23 this year with moderate-severe HF limitation. Over the last few months [name] has been declining with increased fatigue and dyspnea. In July had new onset A Fib. Started on anticoagulants and diuretics increased.   [name] was seen on 04/27/22 for an acute HF visit. Due to decline and concern for low output [name] was set up for cath.   Admitted from the cath lab 09/14 with low output heart failure and VAD work up. BB stopped due to low output. Started on milrinone and diuresed with IV lasix.  Milrinone weaned off with stable CO-OX.  Unfortunately CT chest /abd showed worsening metastatic lung cancer--> large bulky necrotic hilar mass/lymphadenopathy and pleural based mass. Pulmonary and Thoracic Oncology met and feel [name] is not candidate for chemotherapy and thus biopsy of lung massess not pursued.   Palliative Care consulted. Code status changed to DNR/DNI. ICD deactivated. Home hospice arranged through [place] at discharge. Pain management a major issue. Given short supply of oxycodone at discharge, pain will be managed through hospice going forward.  Has f/u in HF clinic scheduled.   See below for hospital  course by problem.   Hospital Course by Problem: Chronic Systolic Heart Failure: - s/p St Jude CRT-D 01/2020. Unable to place LV lead. Interestingly, [name] had a RBBB during a recent admission and had LBBB on prior ECGs. Discussed with Dr. Lovena Le, with current RBBB a left bundle lead will not be likely to help.  - Had PYP 2020 not suggestive of amyloid.  - Echo (1/23): EF 20-25%, RV significantly reduced, RVSP 50 mmHg, moderate Bill, moderate TR, mean gradient 2 mmHg across aortic valve prosthesis. - CPX (3/23): moderate to severe HF limitation. - R/LHC 9/14: RA 12, PA mean 39, PCWP 20, CI 1.7.  - Continue Farxiga 10 mg daily.  - Continue digoxin 0.0625 mg daily, reduced on admit d/t dig level of 0.9.  Repeat level 0.5 on 09/24.  - Volume mildly up today. Continue torsemide 40 mg daily - Hold losartan at d/c d/t soft BP - Continue spironolactone 25 mg daily. - off ? blocker due to low output &  ivabradine with concern for atrial flutter - Barostim implant had been scheduled for 05/19/22. Discussed with research study coordinator. Given the severity of his HF and his overall prognosis, do not feel that [name] is a good candidate for the procedure.  - Now DNR/DNI. Planning home with hospice. ICD deactivated this am.   2. CAD - h/o CABG 2013 - LHC this adm: patent SVGs to R PDA and OM. 90% p and 95% d LAD - No chest pain.  - No ASA with Xarelto  -  Discontinue statin at discharge   3. Paroxysmal Afib/?atrial flutter - ? Episode atrial flutter on tele this admit - Currently appears to be ST - CHA2DS2-VASc score is 5 - On Xarelto    4. DM2 - Insulin dependent. - Hgb A1C 9.6  - On SGLT2i. - Hyperglycemic and hypoglycemic episodes during admit in setting of fluctuating appetite.  - Resumed home insulin at discharge. Metformin discontinued.   5. OSA - Continue CPAP.   6. Aortic valve stenosis - S/P TAVR 2019 with 29 mm S3 - Valve okay on echo 01/23   7. Hypothyroid - On levothyroxine.     8. CKD Stage IIIa - SCr baseline 1.1-1.3 - Scr mildly up, 1.49 on day of discharge   9. H/o germ cell tumor of mediastinum  - s/p resection via sternotomy followed by high-dose mediastinal radiation in the remote past   10. Stage IV Lung CA, squamous cell - previously found to have a 3 cm mass in the superior segment of left lower lobe which was biopsy-proven to be squamous cell carcinoma. - [name] had some mild mediastinal adenopathy without significant activity on PET scan.   - [name] was not felt to be candidate for lobectomy due to his severe cardiac disease and underwent SBRT. - chest CT concerning for worsening metastatic lung CA - large 5 cm LLL mass and 3.5 cm pleural-based mass. Bulky necrotic appearing adenopathy. - PET would likely not be approved to be obtained while inpatient. - Pulmonary and Thoracic Oncology met and feel [name] is not candidate for chemotherapy and thus biopsy of lung massess not pursued. - Dr Sondra Come aware of change.    11. Malnutrition  - Over the last year had 50 pound weight loss.  - Likely d/t combination of advanced HF and metastatic cancer   12. [place] - Palliative Care following.  - [name] is DNR/DNI. Planning to discharge with hospice care through [place]. - ICD deactivated   13. Pain management - Oxycodone per palliative care - Given short supply of pain meds at discharge, long-term management per Hospice.   Discharge Weight: 135 lb  Discharge Vitals: Blood pressure 103/65, pulse (!) 109, temperature 97.7 F (36.5 C), temperature source Oral, resp. rate 17, height 5' 10" (1.778 m), weight 61.4 kg, SpO2 100 %.  Labs: Lab Results  Component Value Date   WBC 8.8 05/08/2022   HGB 10.2 (L) 05/08/2022   HCT 31.0 (L) 05/08/2022   MCV 70.8 (L) 05/08/2022   PLT 382 05/08/2022    Recent Labs  Lab 05/09/22 0603  NA 133*  K 4.1  CL 94*  CO2 29  BUN 44*  CREATININE 1.49*  CALCIUM 9.6  GLUCOSE 145*   Lab Results  Component Value Date    CHOL 119 04/29/2022   HDL 39 (L) 04/29/2022   LDLCALC 65 04/29/2022   TRIG 76 04/29/2022   BNP (last 3 results) Recent Labs    03/08/22 1114 03/30/22 1028 04/27/22 1251  BNP 1,980.1* 1,880.7* 1,466.6*    ProBNP (last 3 results) No results for input(s): "PROBNP" in the last 8760 hours.   Diagnostic Studies/Procedures  RHC [facility] 04/28/22  RA 12 PA 61/26 (39) PCWP 20 (v 27) Fick CO/CI 3.1/1.7 PVR 6.1 PAPi 2.9 PA sat 45%, 47% 3 v CAD with chronically occluded RCA and LCX. Patent SVGs to R PDA and OM. 90% p and 95% d LAD.   Chest CT 1. Significant interval enlargement of bulky, likely necrotic left hilar mass/lymphadenopathy. 2.  Multiple new pulmonary parenchymal and pleural masses and nodules throughout the left lung, consistent with worsened pulmonary and pleural metastatic disease.   Discharge Medications   Allergies as of 05/09/2022       Reactions   Entresto [sacubitril-valsartan] Other (See Comments)   Hypotension    Lipitor [atorvastatin] Other (See Comments)   Myalgias    Adhesive [tape] Rash        Medication List     STOP taking these medications    acetaminophen 500 MG tablet Commonly known as: TYLENOL   losartan 25 MG tablet Commonly known as: COZAAR   metFORMIN 500 MG tablet Commonly known as: GLUCOPHAGE   metoprolol succinate 100 MG 24 hr tablet Commonly known as: TOPROL-XL   simvastatin 20 MG tablet Commonly known as: ZOCOR       TAKE these medications    amitriptyline 25 MG tablet Commonly known as: ELAVIL Take 25 mg by mouth at bedtime.   dapagliflozin propanediol 10 MG Tabs tablet Commonly known as: FARXIGA Take 1 tablet (10 mg total) by mouth daily.   digoxin 0.125 MG tablet Commonly known as: LANOXIN Take 1/2 tablet (0.0625 mg total) by mouth daily. What changed: how much to take   insulin detemir 100 UNIT/ML injection Commonly known as: LEVEMIR Inject 10-24 Units into the skin See admin instructions. 20-24 units in  the morning, 10 units at bedtime   insulin lispro 100 UNIT/ML KwikPen Commonly known as: HUMALOG Inject 0-4 Units into the skin See admin instructions. Per sliding scale at Breakfast and Dinner - pt could not tell me his sliding scale.   levothyroxine 50 MCG tablet Commonly known as: SYNTHROID Take 50 mcg by mouth daily before breakfast.   oxyCODONE 5 MG immediate release tablet Commonly known as: Oxy IR/ROXICODONE Take 1.5 tablets (7.5 mg total) by mouth every 4 (four) hours as needed for up to 5 days for moderate pain.   potassium chloride 10 MEQ tablet Commonly known as: KLOR-CON M Take 1 tablet (10 mEq total) by mouth daily. What changed:  medication strength how much to take   rivaroxaban 20 MG Tabs tablet Commonly known as: XARELTO Take 1 tablet (20 mg total) by mouth daily with supper. What changed: when to take this   spironolactone 25 MG tablet Commonly known as: ALDACTONE TAKE 1 TABLET(25 MG) BY MOUTH DAILY What changed: See the new instructions.   SUPER B COMPLEX PO Take 1 tablet by mouth daily.   torsemide 20 MG tablet Commonly known as: DEMADEX Take 2 tablets (40 mg total) by mouth daily.   zolpidem 5 MG tablet Commonly known as: AMBIEN Take 5 mg by mouth at bedtime as needed for sleep.               Durable Medical Equipment  (From admission, onward)           Start     Ordered   05/02/22 1437  Heart failure home health orders  (Heart failure home health orders / Face to face)  Once       Comments: PT/OT  Question Answer Comment  Heart Failure Follow-up Care Advanced Heart [facility] ([facility]F) [facility] at [phone]   Lab frequency Other see comments   Fax lab results to AHF Clinic at [phone]   Diet Low Sodium Heart Healthy   Fluid restrictions: 2000 mL Fluid   Skilled Nurse to notify MD of weight trends weekly for first 2 weeks. May fax or call: AHF Clinic at [phone] (fax) or  [phone]      05/02/22 1437               Disposition   The patient will be discharged in stable condition to home. Discharge Instructions     (HEART FAILURE PATIENTS) Call MD:  Anytime you have any of the following symptoms: 1) 3 pound weight gain in 24 hours or 5 pounds in 1 week 2) shortness of breath, with or without a dry hacking cough 3) swelling in the hands, feet or stomach 4) if you have to sleep on extra pillows at night in order to breathe.   Complete by: As directed    Diet - low sodium heart healthy   Complete by: As directed    Increase activity slowly   Complete by: As directed    STOP any activity that causes chest pain, shortness of breath, dizziness, sweating, or exessive weakness   Complete by: As directed        Follow-up Information     [facility] Follow up on 05/20/2022.   Specialty: Cardiology Why: Follow up in [facility], [facility] 10 am entrance C, free valet parking please bring all medicaions with you Contact information: [address] [phone] [place]        [name], MD. Go on 05/12/2022.   Specialty: Family Medicine Why: _0 :15pm Contact information: [place] [address] [place] Follow up.   Why: Outpatient Palliative Services-will arrange visit Contact information: [address] [place] [phone] [place] Follow up.   Why: Home Health RN, and Physical Therapy-agency will arrange Home Health visit Contact information: [phone]                  Duration of Discharge Encounter: Greater than 35 minutes   Signed, Kamarius Buckbee N  05/09/2022, 1:01 PM  Patient seen and examined with the above-signed Advanced Practice Provider and/or Housestaff. I personally reviewed laboratory data,  imaging studies and relevant notes. I independently examined the patient and formulated the important aspects of the plan. I have edited the note to reflect any of my changes or salient points. I have personally discussed the plan with the patient and/or family.  Feeling better today. Pain well controlled. Plan will be d/c home with Hospice Care. See progress note from today for further details.   Glori Bickers, MD  11:18 AM

## 2022-05-02 NOTE — Progress Notes (Signed)
Occupational Therapy Treatment Patient Details Name: [name] MRN: [id number] DOB: 03/27/1951 Today's Date: 05/02/2022   History of present illness Patient is a 71 yo male admitted on 04/28/22 with shortness of breath, CHF exacerbation. Mult admissions this year for same.  Pt works as Forensic psychologist for [employer].   PMH includes: CAD, CABG, chronic systolic CHF EF of 70%, status post AICD, aortic stenosis status post TAVR, remote history of squamous cell carcinoma of the left lower lobe status postradiation, CKD stage IIIb, type 2 diabetes, aortic aneurysm, hypertension, CVA, OSA not on CPAP.   OT comments  Patient up in recliner upon entry.  Patient ambulated to bathroom without an assistive device and assistance for line. Patient able to void while standing and stood at sink for grooming tasks with min guard assist for safety.  Patient returned to recliner to review energy conservation strategies with patient requiring cues to recall. Acute OT to continue to follow.    Recommendations for follow up therapy are one component of a multi-disciplinary discharge planning process, led by the attending physician.  Recommendations may be updated based on patient status, additional functional criteria and insurance authorization.    Follow Up Recommendations  Home health OT (or follow physician's recommendation after VAD is placed)    Assistance Recommended at Discharge PRN  Patient can return home with the following  A little help with walking and/or transfers;A little help with bathing/dressing/bathroom;Help with stairs or ramp for entrance;Assist for transportation;Assistance with cooking/housework   Equipment Recommendations  Tub/shower seat    Recommendations for Other Services      Precautions / Restrictions Precautions Precautions: Fall Precaution Comments: monitor HR - tacycardia at rest, SOB with exertion Restrictions Weight Bearing Restrictions: No       Mobility Bed  Mobility Overal bed mobility: Modified Independent             General bed mobility comments: up in recliner upon entry    Transfers Overall transfer level: Modified independent Equipment used: None               General transfer comment: requires help with mult lines     Balance Overall balance assessment: Needs assistance Sitting-balance support: Feet supported Sitting balance-Leahy Scale: Fair Sitting balance - Comments: in recliner   Standing balance support: No upper extremity supported, During functional activity Standing balance-Leahy Scale: Fair Standing balance comment: able to stand at sink without UE support to  perform grooming tasks                           ADL either performed or assessed with clinical judgement   ADL Overall ADL's : Needs assistance/impaired     Grooming: Wash/dry hands;Wash/dry face;Oral care;Applying deodorant;Min guard;Standing Grooming Details (indicate cue type and reason): standing at sink                 Toilet Transfer: Magazine features editor Details (indicate cue type and reason): stood at toilet with min guard assist to void           General ADL Comments: stood at sink for 5+ minutes without seated break    Extremity/Trunk Assessment              Vision       Perception     Praxis      Cognition Arousal/Alertness: Awake/alert Behavior During Therapy: Flat affect Overall Cognitive Status: Within Functional Limits for tasks assessed  Exercises      Shoulder Instructions       General Comments reviewed energy conservation handout    Pertinent Vitals/ Pain       Pain Assessment Pain Assessment: Faces Faces Pain Scale: Hurts little more Pain Location: back and shoulder pain Pain Descriptors / Indicators: Grimacing, Guarding Pain Intervention(s): Limited activity within patient's tolerance, Monitored during session,  Repositioned, Heat applied  Home Living                                          Prior Functioning/Environment              Frequency  Min 2X/week        Progress Toward Goals  OT Goals(current goals can now be found in the care plan section)  Progress towards OT goals: Progressing toward goals  Acute Rehab OT Goals Patient Stated Goal: get better OT Goal Formulation: With patient Time For Goal Achievement: 05/13/22 Potential to Achieve Goals: Fair ADL Goals Pt Will Perform Grooming: with modified independence;sitting;standing Additional ADL Goal #1: Pt will recall 3 energy conservation techniques that he plans to use at home when discharged. Additional ADL Goal #2: Pt will demonstrate improved activity tolerance while participating in a standing activity for 27minutes with no signs of fatigue, SOB, or change in HR.  Plan Discharge plan remains appropriate    Co-evaluation                 AM-PAC OT "6 Clicks" Daily Activity     Outcome Measure   Help from another person eating meals?: None Help from another person taking care of personal grooming?: A Little Help from another person toileting, which includes using toliet, bedpan, or urinal?: A Little Help from another person bathing (including washing, rinsing, drying)?: A Little Help from another person to put on and taking off regular upper body clothing?: A Little Help from another person to put on and taking off regular lower body clothing?: A Little 6 Click Score: 19    End of Session    OT Visit Diagnosis: Muscle weakness (generalized) (M62.81)   Activity Tolerance Patient limited by pain;Patient tolerated treatment well   Patient Left in chair;with call bell/phone within reach;with chair alarm set   Nurse Communication Mobility status        Time: 4585-9292 OT Time Calculation (min): 24 min  Charges: OT General Charges $OT Visit: 1 Visit OT Treatments $Self Care/Home  Management : 8-22 mins $Therapeutic Activity: 8-22 mins  Lodema Hong, OTA Acute Rehabilitation Services  Office [phone]   Trixie Dredge 05/02/2022, 12:38 PM

## 2022-05-03 DIAGNOSIS — E43 Unspecified severe protein-calorie malnutrition: Secondary | ICD-10-CM | POA: Diagnosis not present

## 2022-05-03 DIAGNOSIS — I5023 Acute on chronic systolic (congestive) heart failure: Secondary | ICD-10-CM | POA: Diagnosis not present

## 2022-05-03 DIAGNOSIS — C3490 Malignant neoplasm of unspecified part of unspecified bronchus or lung: Secondary | ICD-10-CM | POA: Diagnosis not present

## 2022-05-03 DIAGNOSIS — Z515 Encounter for palliative care: Secondary | ICD-10-CM | POA: Diagnosis not present

## 2022-05-03 LAB — COOXEMETRY PANEL
Carboxyhemoglobin: 1.5 % (ref 0.5–1.5)
Carboxyhemoglobin: 2.7 % — ABNORMAL HIGH (ref 0.5–1.5)
Methemoglobin: <0.7 % (ref 0.0–1.5)
Methemoglobin: <0.7 % (ref 0.0–1.5)
O2 Saturation: 41.9 %
O2 Saturation: 67.1 %
Total hemoglobin: 10.7 g/dL — ABNORMAL LOW (ref 12.0–16.0)
Total hemoglobin: 11.2 g/dL — ABNORMAL LOW (ref 12.0–16.0)

## 2022-05-03 LAB — GLUCOSE, CAPILLARY
Glucose-Capillary: 406 mg/dL — ABNORMAL HIGH (ref 70–99)
Glucose-Capillary: 350 mg/dL — ABNORMAL HIGH (ref 70–99)
Glucose-Capillary: 176 mg/dL — ABNORMAL HIGH (ref 70–99)
Glucose-Capillary: 178 mg/dL — ABNORMAL HIGH (ref 70–99)
Glucose-Capillary: 120 mg/dL — ABNORMAL HIGH (ref 70–99)

## 2022-05-03 LAB — BASIC METABOLIC PANEL
Anion gap: 11 (ref 5–15)
BUN: 45 mg/dL — ABNORMAL HIGH (ref 8–23)
CO2: 26 mmol/L (ref 22–32)
Calcium: 9.7 mg/dL (ref 8.9–10.3)
Chloride: 94 mmol/L — ABNORMAL LOW (ref 98–111)
Creatinine, Ser: 1.05 mg/dL (ref 0.61–1.24)
GFR, Estimated: >60 mL/min (ref >60)
Glucose, Bld: 427 mg/dL — ABNORMAL HIGH (ref 70–99)
Potassium: 5.1 mmol/L (ref 3.5–5.1)
Sodium: 131 mmol/L — ABNORMAL LOW (ref 135–145)

## 2022-05-03 LAB — CBC
HCT: 31.3 % — ABNORMAL LOW (ref 39.0–52.0)
Hemoglobin: 10.6 g/dL — ABNORMAL LOW (ref 13.0–17.0)
MCH: 23.8 pg — ABNORMAL LOW (ref 26.0–34.0)
MCHC: 33.9 g/dL (ref 30.0–36.0)
MCV: 70.2 fL — ABNORMAL LOW (ref 80.0–100.0)
Platelets: 372 10*3/uL (ref 150–400)
RBC: 4.46 MIL/uL (ref 4.22–5.81)
RDW: 18.3 % — ABNORMAL HIGH (ref 11.5–15.5)
WBC: 9.3 10*3/uL (ref 4.0–10.5)
nRBC: 0 % (ref 0.0–0.2)

## 2022-05-03 LAB — DIGOXIN LEVEL: Digoxin Level: 0.6 ng/mL — ABNORMAL LOW (ref 0.8–2.0)

## 2022-05-03 MED ORDER — INSULIN DETEMIR 100 UNIT/ML ~~LOC~~ SOLN
20.0000 [IU] | Freq: Two times a day (BID) | SUBCUTANEOUS | Status: DC
  Filled 2022-05-03: qty 0.2

## 2022-05-03 MED ORDER — MAGNESIUM HYDROXIDE 400 MG/5ML PO SUSP
30.0000 mL | Freq: Every day | ORAL | Status: DC | PRN
  Administered 2022-05-03 – 2022-05-05 (×2): 30 mL via ORAL
  Filled 2022-05-03 (×3): qty 30

## 2022-05-03 MED ORDER — GLUCERNA SHAKE PO LIQD
237.0000 mL | Freq: Three times a day (TID) | ORAL | Status: DC
  Administered 2022-05-03 – 2022-05-09 (×15): 237 mL via ORAL

## 2022-05-03 MED ORDER — IPRATROPIUM-ALBUTEROL 0.5-2.5 (3) MG/3ML IN SOLN
RESPIRATORY_TRACT | Status: AC
  Filled 2022-05-03: qty 3

## 2022-05-03 MED ORDER — IPRATROPIUM-ALBUTEROL 0.5-2.5 (3) MG/3ML IN SOLN
3.0000 mL | Freq: Four times a day (QID) | RESPIRATORY_TRACT | Status: DC | PRN
  Administered 2022-05-03 – 2022-05-06 (×4): 3 mL via RESPIRATORY_TRACT
  Filled 2022-05-03 (×3): qty 3

## 2022-05-03 MED ORDER — TORSEMIDE 20 MG PO TABS
10.0000 mg | ORAL_TABLET | Freq: Every day | ORAL | Status: DC
  Administered 2022-05-04: 10 mg via ORAL
  Filled 2022-05-03: qty 1

## 2022-05-03 MED ORDER — INSULIN ASPART 100 UNIT/ML IJ SOLN
20.0000 [IU] | Freq: Once | INTRAMUSCULAR | Status: AC
  Administered 2022-05-03: 20 [IU] via SUBCUTANEOUS

## 2022-05-03 MED ORDER — LOSARTAN POTASSIUM 25 MG PO TABS
12.5000 mg | ORAL_TABLET | Freq: Every day | ORAL | Status: DC
  Administered 2022-05-04 – 2022-05-08 (×4): 12.5 mg via ORAL
  Filled 2022-05-03 (×5): qty 1

## 2022-05-03 MED ORDER — INSULIN ASPART 100 UNIT/ML IJ SOLN
0.0000 [IU] | Freq: Every day | INTRAMUSCULAR | Status: DC
  Administered 2022-05-07: 3 [IU] via SUBCUTANEOUS
  Administered 2022-05-08: 2 [IU] via SUBCUTANEOUS

## 2022-05-03 MED ORDER — INSULIN DETEMIR 100 UNIT/ML ~~LOC~~ SOLN
18.0000 [IU] | Freq: Two times a day (BID) | SUBCUTANEOUS | Status: DC
  Administered 2022-05-03 – 2022-05-05 (×5): 18 [IU] via SUBCUTANEOUS
  Filled 2022-05-03 (×7): qty 0.18

## 2022-05-03 MED ORDER — INSULIN ASPART 100 UNIT/ML IJ SOLN
0.0000 [IU] | Freq: Three times a day (TID) | INTRAMUSCULAR | Status: DC
  Administered 2022-05-03 (×2): 4 [IU] via SUBCUTANEOUS
  Administered 2022-05-04 – 2022-05-06 (×6): 3 [IU] via SUBCUTANEOUS
  Administered 2022-05-06: 7 [IU] via SUBCUTANEOUS
  Administered 2022-05-08: 15 [IU] via SUBCUTANEOUS
  Administered 2022-05-09: 4 [IU] via SUBCUTANEOUS

## 2022-05-03 NOTE — Plan of Care (Signed)
  Problem: Activity: Goal: Ability to return to baseline activity level will improve Outcome: Progressing   Problem: Clinical Measurements: Goal: Respiratory complications will improve Outcome: Progressing   Problem: Activity: Goal: Risk for activity intolerance will decrease Outcome: Progressing   Problem: Elimination: Goal: Will not experience complications related to urinary retention Outcome: Progressing

## 2022-05-03 NOTE — Progress Notes (Addendum)
Advanced Heart Failure Rounding Note  PCP-Cardiologist: Fransico Him, MD   Subjective:   09/17: milrinone decreased to 0.125. Sinus tach vs atrial flutter on telemetry. Started IV amio. Ivabradine stopped. 9/18- Milrinone and Heparin stopped. Started back on xarelto.   CO-OX 42% -> repeat -> 67%  Complaining of fatigue. Met with Palliative and his sons.   Chest CT 1. Significant interval enlargement of bulky, likely necrotic left hilar mass/lymphadenopathy. 2. Multiple new pulmonary parenchymal and pleural masses and nodules throughout the left lung, consistent with worsened pulmonary and pleural metastatic disease.  R/LHC 04/28/22 RA 12 PA 61/26 (39) PCWP 20 (v 27) Fick CO/CI 3.1/1.7 PVR 6.1 PAPi 2.9 PA sat 45%, 47% 3 v CAD with chronically occluded RCA and LCX. Patent SVGs to R PDA and OM. 90% p and 95% d LAD.     Objective:   Weight Range: 61.5 kg Body mass index is 19.44 kg/m.   Vital Signs:   Temp:  [97.7 F (36.5 C)-98.6 F (37 C)] 98.5 F (36.9 C) (09/19 0803) Pulse Rate:  [100-106] 103 (09/19 0803) Resp:  [18-22] 20 (09/19 0803) BP: (91-110)/(63-72) 91/68 (09/19 0803) SpO2:  [96 %-100 %] 99 % (09/19 0803) Weight:  [61.5 kg] 61.5 kg (09/19 0412) Last BM Date : 05/02/22  Weight change: Filed Weights   05/01/22 0547 05/02/22 0100 05/03/22 0412  Weight: 60.5 kg 61.2 kg 61.5 kg    Intake/Output:   Intake/Output Summary (Last 24 hours) at 05/03/2022 0937 Last data filed at 05/03/2022 6378 Gross per 24 hour  Intake 1953.72 ml  Output 3850 ml  Net -1896.28 ml      Physical Exam   CVP 3 personally checked.  General:  Thin, cachetic HEENT: normal Neck: supple. no JVD. Carotids 2+ bilat; no bruits. No lymphadenopathy or thryomegaly appreciated. Cor: PMI nondisplaced. Tachy Regular rate & rhythm. No rubs, gallops or murmurs. Lungs: clear Abdomen: soft, nontender, nondistended. No hepatosplenomegaly. No bruits or masses. Good bowel  sounds. Extremities: no cyanosis, clubbing, rash, edema. RUE PICC  Neuro: alert & orientedx3, cranial nerves grossly intact. moves all 4 extremities w/o difficulty. Affect pleasant   Telemetry   ST 90-100s   Labs    CBC Recent Labs    05/02/22 0450 05/03/22 0546  WBC 7.7 9.3  HGB 10.7* 10.6*  HCT 32.1* 31.3*  MCV 69.8* 70.2*  PLT 351 588   Basic Metabolic Panel Recent Labs    05/02/22 0450 05/03/22 0546  NA 133* 131*  K 4.1 5.1  CL 95* 94*  CO2 26 26  GLUCOSE 264* 427*  BUN 30* 45*  CREATININE 1.00 1.05  CALCIUM 9.2 9.7   Liver Function Tests No results for input(s): "AST", "ALT", "ALKPHOS", "BILITOT", "PROT", "ALBUMIN" in the last 72 hours. No results for input(s): "LIPASE", "AMYLASE" in the last 72 hours. Cardiac Enzymes No results for input(s): "CKTOTAL", "CKMB", "CKMBINDEX", "TROPONINI" in the last 72 hours.  BNP: BNP (last 3 results) Recent Labs    03/08/22 1114 03/30/22 1028 04/27/22 1251  BNP 1,980.1* 1,880.7* 1,466.6*    ProBNP (last 3 results) No results for input(s): "PROBNP" in the last 8760 hours.   D-Dimer No results for input(s): "DDIMER" in the last 72 hours. Hemoglobin A1C No results for input(s): "HGBA1C" in the last 72 hours. Fasting Lipid Panel No results for input(s): "CHOL", "HDL", "LDLCALC", "TRIG", "CHOLHDL", "LDLDIRECT" in the last 72 hours. Thyroid Function Tests No results for input(s): "TSH", "T4TOTAL", "T3FREE", "THYROIDAB" in the last 72 hours.  Invalid input(s): "FREET3"  Other results:   Imaging    No results found.   Medications:     Scheduled Medications:  (feeding supplement) PROSource Plus  30 mL Oral BID BM   amitriptyline  25 mg Oral QHS   Chlorhexidine Gluconate Cloth  6 each Topical Daily   dapagliflozin propanediol  10 mg Oral Daily   digoxin  0.0625 mg Oral Daily   feeding supplement  237 mL Oral TID BM   insulin aspart  0-20 Units Subcutaneous TID WC   insulin aspart  0-5 Units  Subcutaneous QHS   insulin aspart  5 Units Subcutaneous TID WC   insulin detemir  10 Units Subcutaneous BID   levothyroxine  50 mcg Oral QAC breakfast   losartan  12.5 mg Oral Daily   multivitamin with minerals  1 tablet Oral Daily   rivaroxaban  20 mg Oral Q supper   simvastatin  20 mg Oral QPM   sodium chloride flush  10-40 mL Intracatheter Q12H   sodium chloride flush  3 mL Intravenous Q12H   sodium chloride flush  3 mL Intravenous Q12H   spironolactone  25 mg Oral Daily   torsemide  20 mg Oral Daily    Infusions:  sodium chloride     sodium chloride      PRN Medications: sodium chloride, sodium chloride, acetaminophen, docusate sodium, polyethylene glycol, sodium chloride flush, sodium chloride flush, sodium chloride flush, traMADol, zolpidem    Patient Profile   71 y/o male with chronic systolic HF due to ICM with several weeks progressive HF symptoms. Presented 9/14 for outpatient R/LHC which demonstrated low cardiac output Fick CO/CI 3.1/1.7. Admitted from cath lab for HF optimization w/ initiation of milrinone and VAD w/u.   Assessment/Plan   Chronic Systolic Heart Failure: - s/p [employer] CRT-D 01/2020. Unable to place LV lead. Interestingly, he had a RBBB during a recent admission and had LBBB on prior ECGs. Discussed with Dr. Taylor, with current RBBB a left bundle lead will not be likely to help.  - Had PYP 2020 not suggestive of amyloid.  - Echo (1/23): EF 20-25%, RV significantly reduced, RVSP 50 mmHg, moderate MR, moderate TR, mean gradient 2 mmHg across aortic valve prosthesis. - CPX (3/23): moderate to severe HF limitation. - R/LHC 9/14: RA 12, PA mean 39, PCWP 20, CI 1.7.  - Milrinone was stopped yesterday. Todays CO-OX is 42%. Repeat now.  -CVP 2-3. Hold torsemide today. Tomorrow start torsemide 10 mg daily.  - Continue Farxiga 10 mg daily.  - Continue digoxin 0.0625 mg daily, reduced on admit d/t dig level of 0.9. . Repeat level 0.6.  - Continue losartan  12.5 mg daily but switch to bedtime.  (failed Entresto in past due to low BP).  - Continue spironolactone 25 mg daily. - off ? blocker due to low output  - off ivabradine with concern for atrial flutter - Admitted for VAD w/u but CT chest shows progressive metastatic lung CA. - Now DNR/DNI.    2. CAD - h/o CABG 2013 - LHC this adm: patent SVGs to R PDA and OM. 90% p and 95% d LAD - No chest pain.  - No ASA with Xarelto  - Continue simvastatin 20 mg daily. LDL ok at 65 mg/dL    3. Paroxysmal Afib/?atrial flutter - Remains in NSR but rate up with milrinone (see discussion above). ? Atrial flutter on tele yesterday. - CHA2DS2-VASc score is 5 -In ST today - On Xarelto     4. DM2 - Insulin dependent. - Hgb A1C 9.6  - On SGLT2i. - Now hyperglycemic. Blood glucose  300-400 -On SSI - Diabetes Coordinator consulted.  Increase levemir to 20 units BID. -Adjusting SSI + meal coverage. Switch oral supplement to glucerna.  - At d/c will need to restart home metformin.    5. OSA - Continue CPAP.   6. Aortic valve stenosis - S/P TAVR 2019 with 29 mm S3 - Valve okay on echo 01/23   7. Hypothyroid - On levothyroxine.    8. 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 Stage IV Lung CA, squamous cell - previously found to have a 3 cm mass in the superior segment of left lower lobe which was biopsy-proven to be squamous cell carcinoma. - He had some mild mediastinal adenopathy without significant activity on PET scan.   - He was not felt to be candidate for lobectomy due to his severe cardiac disease and underwent SBRT. - chest CT concerning for worsening metastatic lung CA - large 5 cm LLL mass and 3.5 cm pleural-based mass. Bulky necrotic appearing adenopathy. - PET would likely not be approved to be obtained while inpatient. - Pulmonary  and Thoracic Oncology met and feel he is not candidate for chemotherapy and thus biopsy of lung massess not being pursued.  11. Malnutrition  -Over the last year has had 50 pound weight loss.  -Taking supplements. Stop ensure. Switch to glucerna with hyperglycemia   12. GOC -Palliative Care following. He is DNR/DNI. For now he wants to hold off on Hospice.  - Completing Most form.  - CO-OX markedly lower today which further supports end stage disease.   Not sure home inotropes would be an option.   Check O2 sats with ambulation. May need home oxygen.   Home Health OT/PT ordered.   Repeat CO-OX stable. No need to resume inotropes.   Length of Stay: 5  Amy Clegg, NP  05/03/2022, 9:37 AM  Advanced Heart Failure Team Pager [phone] (M-F; 7a - 5p)  Please contact [facility] for night-coverage after hours (5p -7a ) and weekends on [email or web address]  Patient seen and examined with the above-signed Advanced Practice Provider and/or Housestaff. I personally reviewed laboratory data, imaging studies and relevant notes. I independently examined the patient and formulated the important aspects of the plan. I have edited the note to reflect any of my changes or salient points. I have personally discussed the plan with the patient and/or family.  Off milrinone and amio. Am co-ox was low but repeat OK. Feels weak. No CP, orthopnea or PND. CVP low 2-3. Blood sugars high.   Has been discussing with Palliative Care and Chaplain.   General:  Weak appearing. No resp difficulty HEENT: normal Neck: supple.No JVD  Carotids 2+ bilat; no bruits. No lymphadenopathy or thryomegaly appreciated. Cor: PMI nondisplaced. Regular tachy No rubs, gallops or murmurs. Lungs: crackles left base Abdomen: soft, nontender, nondistended. No hepatosplenomegaly. No bruits or masses. Good bowel sounds. Extremities: no cyanosis, clubbing, rash, edema Neuro: alert & orientedx3, cranial nerves grossly intact. moves all 4  extremities w/o difficulty. Affect pleasant  Remains tenuous with end-stage HF and recurrent, Stage IV CA. Not candidate for chemo or advanced HF therapies. Hold diuretics. Switch Ensure to Glucerna.  Discussing Hospice vs Palliative options.   Daniel Bensimhon, MD  3:18   PM

## 2022-05-03 NOTE — Progress Notes (Signed)
This chaplain responded to PMT NP-Mary's consult for notarizing the Pt. Advance Directive: HCPOA.  The Pt. participated in AD education and is able to answer clarifying questions. The Pt. agreed to notarizing his AD.  The Pt. named Ronnell Guadalajara as his healthcare agent. If this person is unable or unwilling to serve as the Pt. healthcare agent the Pt. next choice is [name] Cotton.  The chaplain gave the Pt. the original AD along with two copies. The chaplain scanned the Pt. AD into his electronic medical records.  The Pt. Is available for F/U spiritual care as needed.  Chaplain Sallyanne Kuster [phone]

## 2022-05-03 NOTE — Progress Notes (Signed)
   05/03/22 1100  Mobility  Activity Ambulated with assistance in hallway  Level of Assistance Modified independent, requires aide device or extra time  Assistive Device None  Distance Ambulated (ft) 90 ft  Activity Response Tolerated well  $Mobility charge 1 Mobility   Mobility Specialist Progress Note  Pre-Mobility: 98% SpO2 (RA)  During Mobility: 113 HR, 94% SpO2 (RA)   Received pt in bed having no complaints and agreeable to mobility. Pt was asymptomatic throughout ambulation and returned to room w/o fault. Left in chair w/ call bell in reach and all needs met.   Lucious Groves Mobility Specialist

## 2022-05-03 NOTE — Progress Notes (Signed)
Pt. CBG 406. On call for Cardiology paged to make aware.

## 2022-05-03 NOTE — Progress Notes (Signed)
Patient ID: [name], male   DOB: [birth year], 71 y.o.   MRN: [id number]    Progress Note from the Palliative Medicine Team at [facility]   Patient Name: [name]        Date: 05/03/2022 DOB: 04/22/1951  Age: 71 y.o. MRN#: [id number] Attending Physician: Jolaine Artist, MD Primary Care Physician: [name], MD Admit Date: 04/28/2022   Medical records reviewed   71 y/o male admitted for dyspnea due to advanced heart failure, consideration of VAD placement, found to have lung masses with lymphadenopathy worrisome for recurrent malignancy.  Multiple co-morbidities AICD,Anemia,Atrial fibrillation, CKD, Systolic heart failure, CAD, Hyperlidpiemia, Hypertension, OSA S/p TAVR, Severe aortic stenosis, DM2, GERD, History of stroke Squamous cell carcinoma of left lung lower lobe   This NP assessed patient at the bedside as a follow up for palliative medicine needs and emotional support.  Dr Haroldine Laws just finished talking with [name] and his son/[name] regarding the seriousness of his current medical situation, limitations of viable treatment options and recommendation for palliative/hospice approach.  Created space and opportunity for patient to explore his thoughts and feelings regarding his current medical situation.  He verbalizing acceptance of his limited prognosis.  He remains hopeful for more quality time.  Plan is to meet tomorrow morning at 0900 am with patient's two sons hoping to discuss a discharge plan.  Patient has limited home support.  I offered education to patient and his son regarding hospice benefit; philosophy and eligibility.   Questions and concerns addressed  Education offered today regarding  the importance of continued conversation with family and the medical providers regarding overall plan of care and treatment options,  ensuring decisions are within the context of the patients values and GOCs.  Questions and concerns addressed   Discussed  with bedside RN   Wadie Lessen NP  Palliative Medicine Team Team Phone # [phone] Pager [phone]

## 2022-05-03 NOTE — Inpatient Diabetes Management (Signed)
Inpatient Diabetes Program Recommendations  AACE/ADA: New Consensus Statement on Inpatient Glycemic Control (2015)  Target Ranges:  Prepandial:   less than 140 mg/dL      Peak postprandial:   less than 180 mg/dL (1-2 hours)      Critically ill patients:  140 - 180 mg/dL   Lab Results  Component Value Date   GLUCAP 350 (H) 05/03/2022   HGBA1C 9.6 (H) 04/29/2022    Review of Glycemic Control  Latest Reference Range & Units 05/02/22 06:39 05/02/22 07:27 05/02/22 11:23 05/02/22 16:22 05/02/22 21:28 05/03/22 06:10 05/03/22 08:19  Glucose-Capillary 70 - 99 mg/dL 546 (HH) 431 (H) 297 (H) 171 (H) 184 (H) 406 (H) 350 (H)    Diabetes history: DM 2 Outpatient Diabetes medications: Farxiga 10 mg Daily, Levemir 20-24 units qam, 10 units qpm, Humalog 0-4 units bid, metformin 1000 mg bid Current orders for Inpatient glycemic control:  Levemir 10 units bid Novolog 0-20 units tid Novolog 5 units tid meal coverage Farxiga 10 mg Daily  Ensure Enlive tid between meals (44 grams of carbs)  Inpatient Diabetes Program Recommendations:    -   Increase Levemir to 20 units bid based on the amount of Novolog given over the past 24 hours.     Thanks,  Tama Headings RN, MSN, BC-ADM Inpatient Diabetes Coordinator Team Pager [phone] (8a-5p)

## 2022-05-03 NOTE — Consult Note (Signed)
   [facility] Inpatient Consult   05/03/2022  JERRI HARGADON 1951/05/10 [id number]  [place] [facility] [ACO] Patient: Medicare ACO REACH  Primary Care Provider:  [name], MD, Brennan Bailey at [place] has a Hospital Nurse with [facility]  Patient discussed in hospital progression meeting for readmission prevention post hospital transition.  Patient for Palliative consult as well. Plan:  Sign off  For questions contact:   Natividad Brood, RN BSN [facility] Liaison  [phone] business mobile phone Toll free office [phone]  Fax number: [phone] [email or web address]

## 2022-05-03 NOTE — Progress Notes (Signed)
   05/03/22 1724  Assess: MEWS Score  Temp 97.6 F (36.4 C)  BP 117/70  MAP (mmHg) 80  ECG Heart Rate 100  Resp (!) 22  Level of Consciousness Alert  SpO2 95 %  O2 Device Room Air  Assess: if the MEWS score is Yellow or Red  Were vital signs taken at a resting state? No  Focused Assessment No change from prior assessment  Does the patient meet 2 or more of the SIRS criteria? No  MEWS guidelines implemented *See Row Information* No, vital signs rechecked  Document  Patient Outcome Stabilized after interventions  Progress note created (see row info) Yes

## 2022-05-03 NOTE — Progress Notes (Signed)
   05/03/22 1700  Assess: MEWS Score  Temp 97.7 F (36.5 C)  BP (!) 89/64  MAP (mmHg) 71  ECG Heart Rate (!) 103  Resp 18  Level of Consciousness Alert  SpO2 93 %  O2 Device Room Air  Assess: MEWS Score  MEWS Temp 0  MEWS Systolic 1  MEWS Pulse 1  MEWS RR 0  MEWS LOC 0  MEWS Score 2  MEWS Score Color Yellow  Assess: if the MEWS score is Yellow or Red  Were vital signs taken at a resting state? No  Focused Assessment No change from prior assessment  Does the patient meet 2 or more of the SIRS criteria? No  MEWS guidelines implemented *See Row Information* No, vital signs rechecked  Treat  Pain Scale 0-10  Pain Score 3  Document  Patient Outcome Stabilized after interventions  Progress note created (see row info) Yes  Assess: SIRS CRITERIA  SIRS Temperature  0  SIRS Pulse 1  SIRS Respirations  0  SIRS WBC 1  SIRS Score Sum  2

## 2022-05-04 ENCOUNTER — Telehealth: Payer: Self-pay | Admitting: Oncology

## 2022-05-04 DIAGNOSIS — I5023 Acute on chronic systolic (congestive) heart failure: Secondary | ICD-10-CM | POA: Diagnosis not present

## 2022-05-04 LAB — BASIC METABOLIC PANEL
Anion gap: 10 (ref 5–15)
BUN: 39 mg/dL — ABNORMAL HIGH (ref 8–23)
CO2: 29 mmol/L (ref 22–32)
Calcium: 9.8 mg/dL (ref 8.9–10.3)
Chloride: 96 mmol/L — ABNORMAL LOW (ref 98–111)
Creatinine, Ser: 1.07 mg/dL (ref 0.61–1.24)
GFR, Estimated: >60 mL/min (ref >60)
Glucose, Bld: 124 mg/dL — ABNORMAL HIGH (ref 70–99)
Potassium: 4.1 mmol/L (ref 3.5–5.1)
Sodium: 135 mmol/L (ref 135–145)

## 2022-05-04 LAB — CBC
HCT: 31.6 % — ABNORMAL LOW (ref 39.0–52.0)
Hemoglobin: 10.6 g/dL — ABNORMAL LOW (ref 13.0–17.0)
MCH: 23.6 pg — ABNORMAL LOW (ref 26.0–34.0)
MCHC: 33.5 g/dL (ref 30.0–36.0)
MCV: 70.2 fL — ABNORMAL LOW (ref 80.0–100.0)
Platelets: 359 10*3/uL (ref 150–400)
RBC: 4.5 MIL/uL (ref 4.22–5.81)
RDW: 18.4 % — ABNORMAL HIGH (ref 11.5–15.5)
WBC: 8 10*3/uL (ref 4.0–10.5)
nRBC: 0 % (ref 0.0–0.2)

## 2022-05-04 LAB — COOXEMETRY PANEL
Carboxyhemoglobin: 3.5 % — ABNORMAL HIGH (ref 0.5–1.5)
Methemoglobin: 1 % (ref 0.0–1.5)
O2 Saturation: 81.9 %
Total hemoglobin: 10.9 g/dL — ABNORMAL LOW (ref 12.0–16.0)

## 2022-05-04 LAB — GLUCOSE, CAPILLARY
Glucose-Capillary: 135 mg/dL — ABNORMAL HIGH (ref 70–99)
Glucose-Capillary: 126 mg/dL — ABNORMAL HIGH (ref 70–99)
Glucose-Capillary: 137 mg/dL — ABNORMAL HIGH (ref 70–99)
Glucose-Capillary: 196 mg/dL — ABNORMAL HIGH (ref 70–99)

## 2022-05-04 MED ORDER — FUROSEMIDE 10 MG/ML IJ SOLN
40.0000 mg | Freq: Once | INTRAMUSCULAR | Status: AC
  Administered 2022-05-04: 40 mg via INTRAVENOUS
  Filled 2022-05-04: qty 4

## 2022-05-04 MED ORDER — OXYCODONE HCL 5 MG PO TABS
2.5000 mg | ORAL_TABLET | Freq: Four times a day (QID) | ORAL | Status: DC | PRN
  Administered 2022-05-04 (×2): 2.5 mg via ORAL
  Filled 2022-05-04 (×2): qty 1

## 2022-05-04 NOTE — Progress Notes (Addendum)
Rates pain 8/10 in back. States that the pain medication has not been helping today. Paged cardiology on call to notify of no relief w/ current pain medications. Awaiting response.

## 2022-05-04 NOTE — Progress Notes (Addendum)
Advanced Heart Failure Rounding Note  PCP-Cardiologist: Fransico Him, MD   Subjective:   09/17: milrinone decreased to 0.125. Sinus tach vs atrial flutter on telemetry. Started IV amio. Ivabradine stopped. 9/18- Milrinone and Heparin stopped. Started back on xarelto.  9/19 Glucose uncontrolled. Insulin adjusted. CVP low . Diuretics held.   Complaining of wheezing after inhalers.   Chest CT 9/15 1. Significant interval enlargement of bulky, likely necrotic left hilar mass/lymphadenopathy. 2. Multiple new pulmonary parenchymal and pleural masses and nodules throughout the left lung, consistent with worsened pulmonary and pleural metastatic disease.  R/LHC 04/28/22 RA 12 PA 61/26 (39) PCWP 20 (v 27) Fick CO/CI 3.1/1.7 PVR 6.1 PAPi 2.9 PA sat 45%, 47% 3 v CAD with chronically occluded RCA and LCX. Patent SVGs to R PDA and OM. 90% p and 95% d LAD.     Objective:   Weight Range: 61.5 kg Body mass index is 19.44 kg/m.   Vital Signs:   Temp:  [97.6 F (36.4 C)-98.5 F (36.9 C)] 97.7 F (36.5 C) (09/20 0324) Pulse Rate:  [100-108] 108 (09/20 0820) Resp:  [17-22] 17 (09/20 0820) BP: (89-117)/(57-78) 102/72 (09/20 0324) SpO2:  [93 %-100 %] 100 % (09/20 0820) Last BM Date : 05/02/22  Weight change: Filed Weights   05/01/22 0547 05/02/22 0100 05/03/22 0412  Weight: 60.5 kg 61.2 kg 61.5 kg    Intake/Output:   Intake/Output Summary (Last 24 hours) at 05/04/2022 0931 Last data filed at 05/04/2022 0300 Gross per 24 hour  Intake 600 ml  Output 2750 ml  Net -2150 ml      Physical Exam   CVP 4-5  General:  In the chair. Thin cachetic.  HEENT: normal Neck: supple. no JVD. Carotids 2+ bilat; no bruits. No lymphadenopathy or thryomegaly appreciated. Cor: PMI nondisplaced. Regular rate & rhythm. No rubs, gallops or murmurs. Lungs: EW throughout.  Abdomen: soft, nontender, nondistended. No hepatosplenomegaly. No bruits or masses. Good bowel sounds. Extremities: no  cyanosis, clubbing, rash, edema. RUE PICC  Neuro: alert & orientedx3, cranial nerves grossly intact. moves all 4 extremities w/o difficulty. Affect pleasant   Telemetry   ST 90-100s   Labs    CBC Recent Labs    05/03/22 0546 05/04/22 0340  WBC 9.3 8.0  HGB 10.6* 10.6*  HCT 31.3* 31.6*  MCV 70.2* 70.2*  PLT 372 010   Basic Metabolic Panel Recent Labs    05/03/22 0546 05/04/22 0340  NA 131* 135  K 5.1 4.1  CL 94* 96*  CO2 26 29  GLUCOSE 427* 124*  BUN 45* 39*  CREATININE 1.05 1.07  CALCIUM 9.7 9.8   Liver Function Tests No results for input(s): "AST", "ALT", "ALKPHOS", "BILITOT", "PROT", "ALBUMIN" in the last 72 hours. No results for input(s): "LIPASE", "AMYLASE" in the last 72 hours. Cardiac Enzymes No results for input(s): "CKTOTAL", "CKMB", "CKMBINDEX", "TROPONINI" in the last 72 hours.  BNP: BNP (last 3 results) Recent Labs    03/08/22 1114 03/30/22 1028 04/27/22 1251  BNP 1,980.1* 1,880.7* 1,466.6*    ProBNP (last 3 results) No results for input(s): "PROBNP" in the last 8760 hours.   D-Dimer No results for input(s): "DDIMER" in the last 72 hours. Hemoglobin A1C No results for input(s): "HGBA1C" in the last 72 hours. Fasting Lipid Panel No results for input(s): "CHOL", "HDL", "LDLCALC", "TRIG", "CHOLHDL", "LDLDIRECT" in the last 72 hours. Thyroid Function Tests No results for input(s): "TSH", "T4TOTAL", "T3FREE", "THYROIDAB" in the last 72 hours.  Invalid input(s): "FREET3"  Other results:   Imaging    No results found.   Medications:     Scheduled Medications:  (feeding supplement) PROSource Plus  30 mL Oral BID BM   amitriptyline  25 mg Oral QHS   Chlorhexidine Gluconate Cloth  6 each Topical Daily   dapagliflozin propanediol  10 mg Oral Daily   digoxin  0.0625 mg Oral Daily   feeding supplement (GLUCERNA SHAKE)  237 mL Oral TID BM   insulin aspart  0-20 Units Subcutaneous TID WC   insulin aspart  0-5 Units Subcutaneous QHS    insulin aspart  5 Units Subcutaneous TID WC   insulin detemir  18 Units Subcutaneous BID   levothyroxine  50 mcg Oral QAC breakfast   losartan  12.5 mg Oral QHS   multivitamin with minerals  1 tablet Oral Daily   rivaroxaban  20 mg Oral Q supper   simvastatin  20 mg Oral QPM   sodium chloride flush  10-40 mL Intracatheter Q12H   sodium chloride flush  3 mL Intravenous Q12H   sodium chloride flush  3 mL Intravenous Q12H   spironolactone  25 mg Oral Daily   torsemide  10 mg Oral Daily    Infusions:  sodium chloride     sodium chloride      PRN Medications: sodium chloride, sodium chloride, acetaminophen, docusate sodium, ipratropium-albuterol, magnesium hydroxide, polyethylene glycol, sodium chloride flush, sodium chloride flush, sodium chloride flush, traMADol, zolpidem    Patient Profile   71 y/o male with chronic systolic HF due to ICM with several weeks progressive HF symptoms. Presented 9/14 for outpatient [facility] which demonstrated low cardiac output Fick CO/CI 3.1/1.7. Admitted from cath lab for HF optimization w/ initiation of milrinone and VAD w/u.   Assessment/Plan   Chronic Systolic Heart Failure: - s/p [employer] CRT-D 01/2020. Unable to place LV lead. Interestingly, he had a RBBB during a recent admission and had LBBB on prior ECGs. Discussed with Dr. Lovena Le, with current RBBB a left bundle lead will not be likely to help.  - Had PYP 2020 not suggestive of amyloid.  - Echo (1/23): EF 20-25%, RV significantly reduced, RVSP 50 mmHg, moderate MR, moderate TR, mean gradient 2 mmHg across aortic valve prosthesis. - CPX (3/23): moderate to severe HF limitation. - R/LHC 9/14: RA 12, PA mean 39, PCWP 20, CI 1.7.  - Milrinone was stopped yesterday. Todays CO-OX is 82%. Repeat now.  -CVP 4-5. Hold torsemide today.  - Continue Farxiga 10 mg daily.  - Continue digoxin 0.0625 mg daily, reduced on admit d/t dig level of 0.9. . Repeat level 0.6.  - Continue losartan 12.5 mg at bedtime.  (failed Entresto in past due to low BP).  - Continue spironolactone 25 mg daily. - off ? blocker due to low output  - off ivabradine with concern for atrial flutter - Admitted for VAD w/u but CT chest shows progressive metastatic lung CA. - Now DNR/DNI.  - Renal function stable.    2. CAD - h/o CABG 2013 - LHC this adm: patent SVGs to R PDA and OM. 90% p and 95% d LAD - No chest pain.  - No ASA with Xarelto  - Continue simvastatin 20 mg daily. LDL ok at 65 mg/dL    3. Paroxysmal Afib/?atrial flutter - Remains in NSR but rate up with milrinone (see discussion above). ? Atrial flutter on tele yesterday. - CHA2DS2-VASc score is 5 -In ST today.  - On Xarelto   4. DM2 -  Insulin dependent. - Hgb A1C 9.6  - On SGLT2i. - Blood sugar better controlled after insulin adjusted.  -Appreciated Diabetes Coordinator input.    - Continue glucerna.  - At d/c will need to restart home metformin.    5. OSA - Continue CPAP.   6. Aortic valve stenosis - S/P TAVR 2019 with 29 mm S3 - Valve okay on echo 01/23   7. Hypothyroid - On levothyroxine.    8. 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 Stage IV Lung CA, squamous cell - previously found to have a 3 cm mass in the superior segment of left lower lobe which was biopsy-proven to be squamous cell carcinoma. - He had some mild mediastinal adenopathy without significant activity on PET scan.   - He was not felt to be candidate for lobectomy due to his severe cardiac disease and underwent SBRT. - chest CT concerning for worsening metastatic lung CA - large 5 cm LLL mass and 3.5 cm pleural-based mass. Bulky necrotic appearing adenopathy. - PET would likely not be approved to be obtained while inpatient. - Pulmonary and Thoracic Oncology met and feel he is not candidate for chemotherapy and thus  biopsy of lung massess not being pursued. -Dr Sondra Come aware of change.   11. Malnutrition  -Over the last year has had 50 pound weight loss.  -Taking supplements. Continue glucerna   12. [facility] -Palliative Care following. He is DNR/DNI. For now he wants to hold off on Hospice.  - Completing Most form.   Wheezing today after Duoneb.   Length of Stay: Lindsay, NP  05/04/2022, 9:31 AM  Advanced Heart Failure Team Pager [phone] (M-F; 7a - 5p)  Please contact [facility] for night-coverage after hours (5p -7a ) and weekends on [email or web address]  Patient seen and examined with the above-signed Advanced Practice Provider and/or Housestaff. I personally reviewed laboratory data, imaging studies and relevant notes. I independently examined the patient and formulated the important aspects of the plan. I have edited the note to reflect any of my changes or salient points. I have personally discussed the plan with the patient and/or family.  He says he feels better but is wheezing on exam. HR is up. Co-ox 82%  CVP 7.   Not ready for Hospice at this point  General:  Weak appearing. Cachetic  No resp difficulty HEENT: normal Neck: supple. JVP 7 Carotids 2+ bilat; no bruits. No lymphadenopathy or thryomegaly appreciated. Cor: PMI nondisplaced. Regular tachy No rubs, gallops or murmurs. Lungs: + wheezing Abdomen: soft, nontender, nondistended. No hepatosplenomegaly. No bruits or masses. Good bowel sounds. Extremities: no cyanosis, clubbing, rash, edema Neuro: alert & orientedx3, cranial nerves grossly intact. moves all 4 extremities w/o difficulty. Affect pleasant  He is tenuous with end-stage HF and stage IV lung CA. Mildly volume overloaded today. Will give IV lasix today. D/w Palliative team. Will have Palliative services at d/c.Not ready for Hospice yet.   Glori Bickers, MD  5:11 PM

## 2022-05-04 NOTE — Progress Notes (Addendum)
Patient ambulated in hallway with mobility Specialist. Patients oxygen level remained greater 90% on room air while ambulating 380 ft.

## 2022-05-04 NOTE — Progress Notes (Signed)
PT Cancellation Note  Patient Details Name: [name] MRN: [id number] DOB: 1950-11-03   Cancelled Treatment:    Reason Eval/Treat Not Completed: Other (comment);Pain limiting ability to participate.  Reports his back pain is severe, unable to move from sitting on side of bed and leaning into the elevated HOB.  Nursing will follow up on meds, and retry as time and pt allow.   Ramond Dial 05/04/2022, 3:27 PM  Mee Hives, PT PhD Acute Rehab Dept. Number: [place] [facility] [place]

## 2022-05-04 NOTE — Progress Notes (Signed)
05/04/22 1204  Mobility  Activity Ambulated with assistance in hallway  Level of Assistance Modified independent, requires aide device or extra time  Assistive Device Other (Comment) (iv pole)  Distance Ambulated (ft) 380 ft  Activity Response Tolerated well  $Mobility charge 1 Mobility   Mobility Specialist Progress Note  Pt was in chair and agreeable. X2 standing breaks d/t fatigue. Returned to bed w/ all needs met and call bell in reach.   Lucious Groves Mobility Specialist

## 2022-05-04 NOTE — Care Management Important Message (Signed)
Important Message  Patient Details  Name: [name] MRN: [id number] Date of Birth: [birth year]   Medicare Important Message Given:  Yes     [name] 05/04/2022, 10:32 AM

## 2022-05-04 NOTE — Telephone Encounter (Signed)
Rosette Reveal and discussed rescheduling [name]'s follow up sooner than 05/26/22.  [name] said he would like to wait until he knows when [name] will be discharged from the hospital.  Advised I will call him back once his discharge date is known.

## 2022-05-04 NOTE — Progress Notes (Signed)
Occupational Therapy Treatment Patient Details Name: [name] MRN: [id number] DOB: 09-Apr-1951 Today's Date: 05/04/2022   History of present illness Patient is a 71 yo male admitted on 04/28/22 with shortness of breath, CHF exacerbation. Mult admissions this year for same.  Pt works as Forensic psychologist for [employer].   PMH includes: CAD, CABG, chronic systolic CHF EF of 23%, status post AICD, aortic stenosis status post TAVR, remote history of squamous cell carcinoma of the left lower lobe status postradiation, CKD stage IIIb, type 2 diabetes, aortic aneurysm, hypertension, CVA, OSA not on CPAP.   OT comments  Patient seated on EOB upon entry.  Patient stated he slept on EOB with head resting on raised HOB due to back pain and unable to be comfortable in supine. Patient able to walk to sink without assistance and performed self care tasks standing at sink without UE support or assistance. Patient positioned in recliner and reviewed energy conservation strategies with patient requiring mod verbal cues to recall. Patient continues to be limited by pain. Acute OT to continue to follow.    Recommendations for follow up therapy are one component of a multi-disciplinary discharge planning process, led by the attending physician.  Recommendations may be updated based on patient status, additional functional criteria and insurance authorization.    Follow Up Recommendations  Home health OT    Assistance Recommended at Discharge PRN  Patient can return home with the following  A little help with walking and/or transfers;A little help with bathing/dressing/bathroom;Help with stairs or ramp for entrance;Assist for transportation;Assistance with cooking/housework   Equipment Recommendations  Tub/shower seat    Recommendations for Other Services      Precautions / Restrictions Precautions Precautions: Fall Precaution Comments: monitor HR - tacycardia at rest, SOB with exertion Restrictions Weight  Bearing Restrictions: No       Mobility Bed Mobility Overal bed mobility: Modified Independent             General bed mobility comments: seated on EOB upon entry    Transfers Overall transfer level: Modified independent Equipment used: None               General transfer comment: assistance with IV pole and lines     Balance Overall balance assessment: Needs assistance Sitting-balance support: Feet supported Sitting balance-Leahy Scale: Good Sitting balance - Comments: on EOB   Standing balance support: No upper extremity supported, During functional activity Standing balance-Leahy Scale: Fair Standing balance comment: able to stand at sink for self care without UE support                           ADL either performed or assessed with clinical judgement   ADL Overall ADL's : Needs assistance/impaired     Grooming: Wash/dry hands;Wash/dry face;Oral care;Applying deodorant;Brushing hair;Supervision/safety;Standing Grooming Details (indicate cue type and reason): standing at sink                               General ADL Comments: no loss of balance or external support when standing at sink    Extremity/Trunk Assessment              Vision       Perception     Praxis      Cognition Arousal/Alertness: Awake/alert Behavior During Therapy: WFL for tasks assessed/performed Overall Cognitive Status: Within Functional Limits for tasks assessed  General Comments: patient stated he sleep sitting on EOB with head resting to right side with HOB up to address pain.        Exercises      Shoulder Instructions       General Comments reviewed energy conservation stratagies with patient requiring mod cues to recall    Pertinent Vitals/ Pain       Pain Assessment Pain Assessment: Faces Faces Pain Scale: Hurts even more Pain Location: upper back and low back Pain Descriptors /  Indicators: Grimacing, Guarding Pain Intervention(s): Monitored during session, Repositioned, Heat applied  Home Living                                          Prior Functioning/Environment              Frequency  Min 2X/week        Progress Toward Goals  OT Goals(current goals can now be found in the care plan section)  Progress towards OT goals: Progressing toward goals  Acute Rehab OT Goals Patient Stated Goal: get better OT Goal Formulation: With patient Time For Goal Achievement: 05/13/22 Potential to Achieve Goals: Fair ADL Goals Pt Will Perform Grooming: with modified independence;sitting;standing Additional ADL Goal #1: Pt will recall 3 energy conservation techniques that he plans to use at home when discharged. Additional ADL Goal #2: Pt will demonstrate improved activity tolerance while participating in a standing activity for 13minutes with no signs of fatigue, SOB, or change in HR.  Plan Discharge plan remains appropriate    Co-evaluation                 AM-PAC OT "6 Clicks" Daily Activity     Outcome Measure   Help from another person eating meals?: None Help from another person taking care of personal grooming?: A Little Help from another person toileting, which includes using toliet, bedpan, or urinal?: A Little Help from another person bathing (including washing, rinsing, drying)?: A Little Help from another person to put on and taking off regular upper body clothing?: A Little Help from another person to put on and taking off regular lower body clothing?: A Little 6 Click Score: 19    End of Session    OT Visit Diagnosis: Muscle weakness (generalized) (M62.81)   Activity Tolerance Patient limited by pain;Patient tolerated treatment well   Patient Left in chair;with call bell/phone within reach   Nurse Communication Mobility status        Time: 1025-8527 OT Time Calculation (min): 25 min  Charges: OT General  Charges $OT Visit: 1 Visit OT Treatments $Self Care/Home Management : 8-22 mins $Therapeutic Activity: 8-22 mins  Lodema Hong, [place]  Office [phone]   Trixie Dredge 05/04/2022, 11:42 AM

## 2022-05-04 NOTE — Progress Notes (Signed)
Pt placed on CPAP for tonight.

## 2022-05-05 DIAGNOSIS — R531 Weakness: Secondary | ICD-10-CM

## 2022-05-05 DIAGNOSIS — Z515 Encounter for palliative care: Secondary | ICD-10-CM | POA: Diagnosis not present

## 2022-05-05 DIAGNOSIS — I5023 Acute on chronic systolic (congestive) heart failure: Secondary | ICD-10-CM | POA: Diagnosis not present

## 2022-05-05 DIAGNOSIS — G893 Neoplasm related pain (acute) (chronic): Secondary | ICD-10-CM | POA: Diagnosis not present

## 2022-05-05 DIAGNOSIS — E43 Unspecified severe protein-calorie malnutrition: Secondary | ICD-10-CM | POA: Diagnosis not present

## 2022-05-05 DIAGNOSIS — C3490 Malignant neoplasm of unspecified part of unspecified bronchus or lung: Secondary | ICD-10-CM | POA: Diagnosis not present

## 2022-05-05 LAB — CBC
HCT: 32.2 % — ABNORMAL LOW (ref 39.0–52.0)
Hemoglobin: 10.7 g/dL — ABNORMAL LOW (ref 13.0–17.0)
MCH: 23.6 pg — ABNORMAL LOW (ref 26.0–34.0)
MCHC: 33.2 g/dL (ref 30.0–36.0)
MCV: 70.9 fL — ABNORMAL LOW (ref 80.0–100.0)
Platelets: 364 10*3/uL (ref 150–400)
RBC: 4.54 MIL/uL (ref 4.22–5.81)
RDW: 18.7 % — ABNORMAL HIGH (ref 11.5–15.5)
WBC: 9.1 10*3/uL (ref 4.0–10.5)
nRBC: 0 % (ref 0.0–0.2)

## 2022-05-05 LAB — COOXEMETRY PANEL
Carboxyhemoglobin: 2.6 % — ABNORMAL HIGH (ref 0.5–1.5)
Methemoglobin: <0.7 % (ref 0.0–1.5)
O2 Saturation: 75.2 %
Total hemoglobin: 11.2 g/dL — ABNORMAL LOW (ref 12.0–16.0)

## 2022-05-05 LAB — GLUCOSE, CAPILLARY
Glucose-Capillary: 127 mg/dL — ABNORMAL HIGH (ref 70–99)
Glucose-Capillary: 96 mg/dL (ref 70–99)
Glucose-Capillary: 132 mg/dL — ABNORMAL HIGH (ref 70–99)
Glucose-Capillary: 53 mg/dL — ABNORMAL LOW (ref 70–99)
Glucose-Capillary: 119 mg/dL — ABNORMAL HIGH (ref 70–99)

## 2022-05-05 LAB — BASIC METABOLIC PANEL
Anion gap: 11 (ref 5–15)
BUN: 29 mg/dL — ABNORMAL HIGH (ref 8–23)
CO2: 27 mmol/L (ref 22–32)
Calcium: 9.9 mg/dL (ref 8.9–10.3)
Chloride: 96 mmol/L — ABNORMAL LOW (ref 98–111)
Creatinine, Ser: 0.91 mg/dL (ref 0.61–1.24)
GFR, Estimated: >60 mL/min (ref >60)
Glucose, Bld: 79 mg/dL (ref 70–99)
Potassium: 4.1 mmol/L (ref 3.5–5.1)
Sodium: 134 mmol/L — ABNORMAL LOW (ref 135–145)

## 2022-05-05 MED ORDER — OXYCODONE HCL 5 MG PO TABS: 2.5000 mg | ORAL_TABLET | Freq: Four times a day (QID) | ORAL | Status: DC | PRN

## 2022-05-05 MED ORDER — FUROSEMIDE 10 MG/ML IJ SOLN
INTRAMUSCULAR | Status: AC
  Filled 2022-05-05: qty 2

## 2022-05-05 MED ORDER — POTASSIUM CHLORIDE CRYS ER 20 MEQ PO TBCR
40.0000 meq | EXTENDED_RELEASE_TABLET | Freq: Once | ORAL | Status: AC
  Administered 2022-05-05: 40 meq via ORAL
  Filled 2022-05-05: qty 2

## 2022-05-05 MED ORDER — MAGNESIUM HYDROXIDE 400 MG/5ML PO SUSP
30.0000 mL | ORAL | Status: AC
  Administered 2022-05-05: 30 mL via ORAL
  Filled 2022-05-05: qty 30

## 2022-05-05 MED ORDER — OXYCODONE HCL 5 MG PO TABS: 5.0000 mg | ORAL_TABLET | Freq: Four times a day (QID) | ORAL | Status: DC | PRN

## 2022-05-05 MED ORDER — DOCUSATE SODIUM 100 MG PO CAPS
100.0000 mg | ORAL_CAPSULE | Freq: Every day | ORAL | Status: DC
  Administered 2022-05-05 – 2022-05-06 (×2): 100 mg via ORAL
  Filled 2022-05-05 (×2): qty 1

## 2022-05-05 MED ORDER — OXYCODONE HCL 5 MG PO TABS
5.0000 mg | ORAL_TABLET | ORAL | Status: AC
  Administered 2022-05-05: 5 mg via ORAL
  Filled 2022-05-05: qty 1

## 2022-05-05 MED ORDER — FUROSEMIDE 10 MG/ML IJ SOLN
60.0000 mg | Freq: Once | INTRAMUSCULAR | Status: AC
  Administered 2022-05-05: 60 mg via INTRAVENOUS
  Filled 2022-05-05: qty 6

## 2022-05-05 MED ORDER — OXYCODONE HCL 5 MG PO TABS
5.0000 mg | ORAL_TABLET | ORAL | Status: DC | PRN
  Administered 2022-05-05 – 2022-05-06 (×3): 5 mg via ORAL
  Filled 2022-05-05 (×3): qty 1

## 2022-05-05 NOTE — Progress Notes (Addendum)
Advanced Heart Failure Rounding Note  PCP-Cardiologist: Fransico Him, MD   Subjective:   09/17: milrinone decreased to 0.125. Sinus tach vs atrial flutter on telemetry. Started IV amio. Ivabradine stopped. 9/18- Milrinone and Heparin stopped. Started back on xarelto.  9/19 Glucose uncontrolled. Insulin adjusted. CVP low . Diuretics held.   CO-OX stable 75% off milrinone.  CVP 10.   SBP soft but stable 90s-low 100s  Having a lot of back pain and right-sided pain this am. Pain not well-controlled over night. Can't get comfortable.   R/LHC 04/28/22 RA 12 PA 61/26 (39) PCWP 20 (v 27) Fick CO/CI 3.1/1.7 PVR 6.1 PAPi 2.9 PA sat 45%, 47% 3 v CAD with chronically occluded RCA and LCX. Patent SVGs to R PDA and OM. 90% p and 95% d LAD.     Objective:   Weight Range: 61.5 kg Body mass index is 19.44 kg/m.   Vital Signs:   Temp:  [97.5 F (36.4 C)-98.7 F (37.1 C)] 97.5 F (36.4 C) (09/21 0451) Pulse Rate:  [91-114] 106 (09/21 0500) Resp:  [15-20] 16 (09/21 0600) BP: (96-115)/(58-76) 102/70 (09/21 0600) SpO2:  [91 %-100 %] 96 % (09/21 0500) Last BM Date : 05/02/22  Weight change: Filed Weights   05/01/22 0547 05/02/22 0100 05/03/22 0412  Weight: 60.5 kg 61.2 kg 61.5 kg    Intake/Output:   Intake/Output Summary (Last 24 hours) at 05/05/2022 0659 Last data filed at 05/05/2022 0300 Gross per 24 hour  Intake 960 ml  Output 1650 ml  Net -690 ml      Physical Exam  CVP 10 General:  Cachectic, chronically ill appearing HEENT: normal Neck: supple. JVP ~ 8 cm. Carotids 2+ bilat; no bruits.  Cor: PMI nondisplaced. Regular rate & rhythm. No rubs, gallops or murmurs. Lungs: clear Abdomen: soft, nontender, nondistended. Extremities: no cyanosis, clubbing, rash, edema Neuro: alert & orientedx3, cranial nerves grossly intact. moves all 4 extremities w/o difficulty. Affect pleasant     Telemetry   Sinus tach 100s-110s  Labs    CBC Recent Labs     05/04/22 0340 05/05/22 0443  WBC 8.0 9.1  HGB 10.6* 10.7*  HCT 31.6* 32.2*  MCV 70.2* 70.9*  PLT 359 784   Basic Metabolic Panel Recent Labs    05/04/22 0340 05/05/22 0443  NA 135 134*  K 4.1 4.1  CL 96* 96*  CO2 29 27  GLUCOSE 124* 79  BUN 39* 29*  CREATININE 1.07 0.91  CALCIUM 9.8 9.9   Liver Function Tests No results for input(s): "AST", "ALT", "ALKPHOS", "BILITOT", "PROT", "ALBUMIN" in the last 72 hours. No results for input(s): "LIPASE", "AMYLASE" in the last 72 hours. Cardiac Enzymes No results for input(s): "CKTOTAL", "CKMB", "CKMBINDEX", "TROPONINI" in the last 72 hours.  BNP: BNP (last 3 results) Recent Labs    03/08/22 1114 03/30/22 1028 04/27/22 1251  BNP 1,980.1* 1,880.7* 1,466.6*    ProBNP (last 3 results) No results for input(s): "PROBNP" in the last 8760 hours.   D-Dimer No results for input(s): "DDIMER" in the last 72 hours. Hemoglobin A1C No results for input(s): "HGBA1C" in the last 72 hours. Fasting Lipid Panel No results for input(s): "CHOL", "HDL", "LDLCALC", "TRIG", "CHOLHDL", "LDLDIRECT" in the last 72 hours. Thyroid Function Tests No results for input(s): "TSH", "T4TOTAL", "T3FREE", "THYROIDAB" in the last 72 hours.  Invalid input(s): "FREET3"  Other results:   Imaging    No results found.   Medications:     Scheduled Medications:  (feeding supplement) PROSource  Plus  30 mL Oral BID BM   amitriptyline  25 mg Oral QHS   Chlorhexidine Gluconate Cloth  6 each Topical Daily   dapagliflozin propanediol  10 mg Oral Daily   digoxin  0.0625 mg Oral Daily   feeding supplement (GLUCERNA SHAKE)  237 mL Oral TID BM   insulin aspart  0-20 Units Subcutaneous TID WC   insulin aspart  0-5 Units Subcutaneous QHS   insulin aspart  5 Units Subcutaneous TID WC   insulin detemir  18 Units Subcutaneous BID   levothyroxine  50 mcg Oral QAC breakfast   losartan  12.5 mg Oral QHS   multivitamin with minerals  1 tablet Oral Daily    rivaroxaban  20 mg Oral Q supper   simvastatin  20 mg Oral QPM   sodium chloride flush  10-40 mL Intracatheter Q12H   sodium chloride flush  3 mL Intravenous Q12H   sodium chloride flush  3 mL Intravenous Q12H   spironolactone  25 mg Oral Daily   torsemide  10 mg Oral Daily    Infusions:  sodium chloride     sodium chloride      PRN Medications: sodium chloride, sodium chloride, acetaminophen, docusate sodium, ipratropium-albuterol, magnesium hydroxide, oxyCODONE, polyethylene glycol, sodium chloride flush, sodium chloride flush, sodium chloride flush, traMADol, zolpidem    Patient Profile   71 y/o male with chronic systolic HF due to ICM with several weeks progressive HF symptoms. Presented 9/14 for outpatient [facility] which demonstrated low cardiac output Fick CO/CI 3.1/1.7. Admitted from cath lab for HF optimization w/ initiation of milrinone and VAD w/u.   Assessment/Plan   Chronic Systolic Heart Failure: - s/p St Jude CRT-D 01/2020. Unable to place LV lead. Interestingly, he had a RBBB during a recent admission and had LBBB on prior ECGs. Discussed with Dr. Lovena Le, with current RBBB a left bundle lead will not be likely to help.  - Had PYP 2020 not suggestive of amyloid.  - Echo (1/23): EF 20-25%, RV significantly reduced, RVSP 50 mmHg, moderate MR, moderate TR, mean gradient 2 mmHg across aortic valve prosthesis. - CPX (3/23): moderate to severe HF limitation. - R/LHC 9/14: RA 12, PA mean 39, PCWP 20, CI 1.7.  - CO-OX 75% off milrinone - CVP 10. Give 60 mg lasix IV. - Continue Farxiga 10 mg daily.  - Continue digoxin 0.0625 mg daily, reduced on admit d/t dig level of 0.9. . Repeat level 0.6.  - Continue losartan 12.5 mg at bedtime. (failed Entresto in past due to low BP).  - Continue spironolactone 25 mg daily. - off ? blocker due to low output  - off ivabradine with concern for atrial flutter - Admitted for VAD w/u but CT chest shows progressive metastatic lung CA. - Now  DNR/DNI.  - Renal function stable.    2. CAD - h/o CABG 2013 - LHC this adm: patent SVGs to R PDA and OM. 90% p and 95% d LAD - No chest pain.  - No ASA with Xarelto  - Continue simvastatin 20 mg daily. LDL ok at 65 mg/dL    3. Paroxysmal Afib/?atrial flutter - ? Episode atrial flutter on tele this admit - Currently appears to be ST - CHA2DS2-VASc score is 5 - On Xarelto   4. DM2 - Insulin dependent. - Hgb A1C 9.6  - On SGLT2i. - Blood sugar better controlled after insulin adjusted.  - Appreciated Diabetes Coordinator input.    - Continue glucerna.  - At d/c  will need to restart home metformin.    5. OSA - Continue CPAP.   6. Aortic valve stenosis - S/P TAVR 2019 with 29 mm S3 - Valve okay on echo 01/23   7. Hypothyroid - On levothyroxine.    8. 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 Stage IV Lung CA, squamous cell - previously found to have a 3 cm mass in the superior segment of left lower lobe which was biopsy-proven to be squamous cell carcinoma. - He had some mild mediastinal adenopathy without significant activity on PET scan.   - He was not felt to be candidate for lobectomy due to his severe cardiac disease and underwent SBRT. - chest CT concerning for worsening metastatic lung CA - large 5 cm LLL mass and 3.5 cm pleural-based mass. Bulky necrotic appearing adenopathy. - PET would likely not be approved to be obtained while inpatient. - Pulmonary and Thoracic Oncology met and feel he is not candidate for chemotherapy and thus biopsy of lung massess not pursued. - Dr Sondra Come aware of change.   11. Malnutrition  -Over the last year had 50 pound weight loss.  -Taking supplements. Continue glucerna   12. [place] - Palliative Care following.  - He is DNR/DNI. - For now he wants to hold off on Hospice. Plan for  outpatient palliative medicine.  13. Pain management - Uncontrolled pain overnight. Appears uncomfortable.  - On oxycodone 2.5 mg q 6 hrs PRN and tramadol 50 mg q 6 hrs prn - Will touch base with Palliative to assist with pain management  Wants to try to participate in PT/OT at home. F2F completed. Ordered HF TOC consult.   Nearing ready for discharge but need to get pain under control.   Length of Stay: 7  FINCH, LINDSAY N, PA-C  05/05/2022, 6:59 AM  Advanced Heart Failure Team Pager [phone] (M-F; 7a - 5p)  Please contact [facility] for night-coverage after hours (5p -7a ) and weekends on [email or web address]  Patient seen and examined with the above-signed Advanced Practice Provider and/or Housestaff. I personally reviewed laboratory data, imaging studies and relevant notes. I independently examined the patient and formulated the important aspects of the plan. I have edited the note to reflect any of my changes or salient points. I have personally discussed the plan with the patient and/or family.  Breathing better today. No wheezing. Co-ox improved. CVP 10 (got IV lasix yesterday)  C/o increasing back pain.   General:  Frail appearing. No resp difficulty HEENT: normal Neck: supple. nJVP 10 Carotids 2+ bilat; no bruits. No lymphadenopathy or thryomegaly appreciated. Cor: PMI nondisplaced. Regular tachy . No rubs, gallops or murmurs. Lungs: crackles left base  Abdomen: soft, nontender, nondistended. No hepatosplenomegaly. No bruits or masses. Good bowel sounds. Extremities: no cyanosis, clubbing, rash, edema Neuro: alert & orientedx3, cranial nerves grossly intact. moves all 4 extremities w/o difficulty. Affect pleasant  Agree with IV lasix. Have reached out to Palliative Care team to assist with pain management. He still is reluctant about Hospice care.   Glori Bickers, MD  4:34 PM

## 2022-05-05 NOTE — Progress Notes (Signed)
05/05/22 1400  Mobility  Activity Ambulated with assistance in hallway  Level of Assistance Standby assist, set-up cues, supervision of patient - no hands on  Assistive Device Other (Comment) (Iv pole)  Distance Ambulated (ft) 230 ft  Activity Response Tolerated well  $Mobility charge 1 Mobility   Mobility Specialist Progress Note  Received pt EOB having no complaints and agreeable to mobility. Pt was asymptomatic throughout ambulation and returned to room w/o fault. Left EOB w/ call bell in reach and all needs met.   Kyla Jones Mobility Specialist   

## 2022-05-05 NOTE — TOC Initial Note (Signed)
Transition of Care [facility]) - Initial/Assessment Note    Patient Details  Name: [name] MRN: [id number] Date of Birth: 03-02-1951  Transition of Care [facility]) CM/SW Contact:    Erenest Rasher, RN Phone Number: [phone] 05/05/2022, 12:08 PM  Clinical Narrative:                  HF TOC CM spoke to pt at bedside. Pt agreeable to [facility] at this time. Offered choice for [facility]. Pt agreeable to [facility]. Contacted rep, Caryl Pina with referral. Pt may need a neb machine for home. Outpt Palliative arranged with [facility].     Expected Discharge Plan: [place] Barriers to Discharge: Continued Medical Work up, No Barriers Identified   Patient Goals and CMS Choice Patient states their goals for this hospitalization and ongoing recovery are:: wants to go back to work [email or web address].[email or web address] Compare Post Acute Care list provided to:: Patient    Expected Discharge Plan and Services Expected Discharge Plan: [place]   Discharge Planning Services: CM Consult Post Acute Care Choice: [place] arrangements for the past 2 months: [place] Arranged: RN, PT [facility] Agency: [place] ([place]) Date [place]: 05/05/22 Time [place]: 1205 Representative spoke with at [place]: [place] Arrangements/Services Living arrangements for the past 2 months: [place] Lives with:: Self Patient language and need for interpreter reviewed:: Yes Do you feel safe going back to the place where you live?: Yes      Need for Family Participation in Patient Care: Yes (Comment) Care giver support system in place?: Yes (comment) Current home services: DME (CPAP, scale) Criminal Activity/Legal Involvement Pertinent to Current Situation/Hospitalization: No - Comment as needed  Activities of Daily Living Home Assistive Devices/Equipment: None ADL Screening  (condition at time of admission) Patient's cognitive ability adequate to safely complete daily activities?: Yes Is the patient deaf or have difficulty hearing?: No Does the patient have difficulty seeing, even when wearing glasses/contacts?: No Does the patient have difficulty concentrating, remembering, or making decisions?: No Patient able to express need for assistance with ADLs?: Yes Does the patient have difficulty dressing or bathing?: No Independently performs ADLs?: Yes (appropriate for developmental age) Does the patient have difficulty walking or climbing stairs?: No Weakness of Legs: None Weakness of Arms/Hands: None  Permission Sought/Granted Permission sought to share information with : Case Manager, Family Supports, PCP Permission granted to share information with : Yes, Verbal Permission Granted  Share Information with NAME: [place] granted to share info w Relationship: sons  Permission granted to share info w Contact Information: [phone]  Emotional Assessment Appearance:: Appears stated age Attitude/Demeanor/Rapport: Engaged Affect (typically observed): Accepting Orientation: : Oriented to Self, Oriented to Place, Oriented to  Time, Oriented to Situation   Psych Involvement: No (comment)  Admission diagnosis:  Acute on chronic systolic CHF (congestive heart failure) (HCC) [I50.23] Patient Active Problem List   Diagnosis Date Noted   Protein-calorie malnutrition, severe 04/30/2022   Persistent atrial fibrillation ([place]) 03/23/2022   Secondary hypercoagulable state ([place]) 03/23/2022   Hypothyroidism 09/30/2021   Acute renal failure superimposed on stage 3a chronic kidney disease ([place]) 09/30/2021   Elevated troponin 09/30/2021   Normal anion gap metabolic acidosis 65/78/4696  Acute on chronic systolic (congestive) heart failure ([place]) 09/30/2021   Acute on chronic systolic CHF (congestive heart failure) ([place]) 08/30/2021   Ischemic  cardiomyopathy 02/11/2020   Left bundle branch block 01/08/2020   Squamous cell carcinoma of bronchus in left lower lobe ([place]) 11/22/2018   Nodule of lower lobe of left lung 10/27/2018   OSA (obstructive sleep apnea) 82/88/3374   Chronic systolic CHF (congestive heart failure) ([place]) 07/04/2018   S/P TAVR (transcatheter aortic valve replacement) 05/15/2018   History of CVA (cerebrovascular accident)    Cardiomyopathy ([place]) 11/11/2016   Aortic stenosis, severe 06/27/2014   AAA (abdominal aortic aneurysm) (HCC)    Insomnia    Hypertension    SOB (shortness of breath)    Insulin dependent type 2 diabetes mellitus ([place]) 12/28/2012   Dyslipidemia 12/28/2012   CAD (coronary artery disease), native coronary artery 02/13/2012   Hx of radiation therapy to mediastinum 08/16/1983   Malignant seminoma of mediastinum ([place]) 08/16/1983   PCP:  [name], MD Pharmacy:   [facility] [place], [place] - [address] [place] [address] Phone: [phone] Fax: [phone]     Social Determinants of Health (SDOH) Interventions    Readmission Risk Interventions     No data to display

## 2022-05-05 NOTE — Progress Notes (Signed)
[facility])  Request to meet with patient and explain outpatient palliative vs hospice services.  Met with patient, provided support, answered questions.   He is not sure he ultimately needs hospice at this time, he is hoping to return to his previous way of living (independent, driving, and working), although he is realistic that this may not happen.  He advised his pain is under better control, but is now concerned with constipation.  Discussed outpatient palliative and hospice. He was agreeable to a hospice RN coming to his home after his discharge, and would make a decision at that time.   ACC will reach out to [facility] after discharge and schedule a time to evaluate him in the home.  Venia Carbon DNP, RN [facility] Liaison

## 2022-05-05 NOTE — Progress Notes (Signed)
Patient ID: HURSHELL DINO, male   DOB: 26-Oct-1950, 71 y.o.   MRN: [id number]    Progress Note from the Palliative Medicine Team at [facility]   Patient Name: [name]        Date: 05/05/2022 DOB: 16-Nov-1950  Age: 71 y.o. MRN#: [id number] Attending Physician: Jolaine Artist, MD Primary Care Physician: [name], MD Admit Date: 04/28/2022   Medical records reviewed   71 y/o male admitted for dyspnea due to advanced heart failure, consideration of VAD placement, found to have lung masses with lymphadenopathy worrisome for recurrent malignancy. Not a candidate for oncologic interventions at this time 2/2 to heart disease.   Multiple co-morbidities AICD,Anemia,Atrial fibrillation, CKD, Systolic heart failure, CAD, Hyperlidpiemia, Hypertension, OSA S/p TAVR, Severe aortic stenosis, DM2, GERD, History of stroke Squamous cell carcinoma of left lung lower lobe  This NP assessed patient at the bedside as a follow up for palliative medicine needs and emotional support, for continued conversation regarding goals of care as he transitions out of the hospital.  Patient is alert and oriented, he is sitting on side of bed "getting situated".  Reports uncontrolled pain thru the night.  Education offered on pain management strategies.    Education offered on the various options as he transitions home; home health with continued paramedic visit along with outpatient palliative care services versus home hospice benefit.  Education offered regarding hospice benefit; philosophy and eligibility.   Encouraged Mr [name] to consider home hospice services, he agrees to speak with hospice liaison.     We explored the patient's likely need for anticipatory care needs.  Patient is weak and has very limited social support at home.       MOST form introduced and completed.  Hard copy placed in chart..  Hard choices booklet was left for review.   Plan of Care: -DNR/DNI -home with HH and PCS--  likely will convert to hospice  -symptom management:       - Oxycodone 5 mg po every 4 hrs prn - (education offered regarding side effects)        - Colace 100 mg po daily  -"take it one day at a time" per Mr Broady  Education offered today regarding  the importance of continued conversation with his family and the medical providers regarding overall plan of care and treatment options,  ensuring decisions are within the context of the patients values and GOCs.  Questions and concerns addressed   Discussed with bedside RN and attending team and Zaheer Wageman [facility] team    Wadie Lessen NP  Palliative Medicine Team Team Phone # [phone] Pager [phone]

## 2022-05-05 NOTE — Progress Notes (Signed)
Physical Therapy Treatment Patient Details Name: [name] MRN: [id number] DOB: 1950-09-06 Today's Date: 05/05/2022   History of Present Illness Patient is a 71 yo male admitted on 04/28/22 with shortness of breath, CHF exacerbation. Mult admissions this year for same.  Pt works as Forensic psychologist for [employer].   PMH includes: CAD, CABG, chronic systolic CHF EF of 73%, status post AICD, aortic stenosis status post TAVR, remote history of squamous cell carcinoma of the left lower lobe status postradiation, CKD stage IIIb, type 2 diabetes, aortic aneurysm, hypertension, CVA, OSA not on CPAP.    PT Comments    Pt tolerates treatment well, continuing to ambulate for increased distances. PT encourages the pt to ambulate multiple times daily in an effort to improve activity tolerance. Pt will benefit from assessment of gait without any UE support to determine if a SPC is potentially needed to improve stability and reduce falls risk.   Recommendations for follow up therapy are one component of a multi-disciplinary discharge planning process, led by the attending physician.  Recommendations may be updated based on patient status, additional functional criteria and insurance authorization.  Follow Up Recommendations  Home health PT     Assistance Recommended at Discharge PRN  Patient can return home with the following Assistance with cooking/housework;Assist for transportation   Equipment Recommendations   (TBD, may benefit from cane pending gait assessment without IV pole)    Recommendations for Other Services       Precautions / Restrictions Precautions Precautions: Fall Precaution Comments: monitor HR - tacycardia at rest, SOB with exertion Restrictions Weight Bearing Restrictions: No     Mobility  Bed Mobility Overal bed mobility: Modified Independent                  Transfers Overall transfer level: Needs assistance Equipment used: None Transfers: Sit to/from Stand Sit  to Stand: Supervision                Ambulation/Gait Ambulation/Gait assistance: Supervision Gait Distance (Feet): 250 Feet Assistive device: IV Pole Gait Pattern/deviations: Step-through pattern Gait velocity: reduced Gait velocity interpretation: <1.8 ft/sec, indicate of risk for recurrent falls   General Gait Details: pt with slowed step-through gait, one standing rest break   Stairs             Wheelchair Mobility    Modified Rankin (Stroke Patients Only)       Balance Overall balance assessment: Needs assistance Sitting-balance support: No upper extremity supported, Feet supported Sitting balance-Leahy Scale: Good     Standing balance support: No upper extremity supported, During functional activity Standing balance-Leahy Scale: Fair                              Cognition Arousal/Alertness: Awake/alert Behavior During Therapy: WFL for tasks assessed/performed Overall Cognitive Status: Within Functional Limits for tasks assessed                                          Exercises      General Comments General comments (skin integrity, edema, etc.): tachy into 110s, pt reports increased work of breathing but vitals stable, able to converse when walking      Pertinent Vitals/Pain Pain Assessment Pain Assessment: Faces Faces Pain Scale: Hurts little more Pain Location: back Pain Descriptors / Indicators: Grimacing Pain Intervention(s): Monitored  during session    Home Living                          Prior Function            PT Goals (current goals can now be found in the care plan section) Acute Rehab PT Goals Patient Stated Goal: to get back to work Progress towards PT goals: Progressing toward goals    Frequency    Min 3X/week      PT Plan Current plan remains appropriate    Co-evaluation              AM-PAC PT "6 Clicks" Mobility   Outcome Measure  Help needed turning from  your back to your side while in a flat bed without using bedrails?: None Help needed moving from lying on your back to sitting on the side of a flat bed without using bedrails?: None Help needed moving to and from a bed to a chair (including a wheelchair)?: A Little Help needed standing up from a chair using your arms (e.g., wheelchair or bedside chair)?: A Little Help needed to walk in hospital room?: A Little Help needed climbing 3-5 steps with a railing? : A Little 6 Click Score: 20    End of Session   Activity Tolerance: Patient tolerated treatment well Patient left: in bed;with call bell/phone within reach Nurse Communication: Mobility status PT Visit Diagnosis: Unsteadiness on feet (R26.81);Pain;Difficulty in walking, not elsewhere classified (R26.2) Pain - part of body:  (back)     Time: 1726-1740 PT Time Calculation (min) (ACUTE ONLY): 14 min  Charges:  $Gait Training: 8-22 mins                     Zenaida Niece, PT, DPT Acute Rehabilitation Office [place] 05/05/2022, 5:49 PM

## 2022-05-05 NOTE — Progress Notes (Signed)
   05/05/22 0900  Assess: MEWS Score  Temp 98.6 F (37 C)  BP 96/61  MAP (mmHg) 73  ECG Heart Rate (!) 112  Resp 20  Level of Consciousness Alert  SpO2 98 %  O2 Device Room Air  Assess: MEWS Score  MEWS Temp 0  MEWS Systolic 1  MEWS Pulse 2  MEWS RR 0  MEWS LOC 0  MEWS Score 3  MEWS Score Color Yellow  Treat  Pain Scale 0-10  Pain Score 0  Assess: SIRS CRITERIA  SIRS Temperature  0  SIRS Pulse 1  SIRS Respirations  0  SIRS WBC 1  SIRS Score Sum  2   Chronic yellow mews

## 2022-05-06 ENCOUNTER — Other Ambulatory Visit (HOSPITAL_COMMUNITY): Payer: Self-pay

## 2022-05-06 DIAGNOSIS — C3490 Malignant neoplasm of unspecified part of unspecified bronchus or lung: Secondary | ICD-10-CM

## 2022-05-06 DIAGNOSIS — Z7189 Other specified counseling: Secondary | ICD-10-CM | POA: Diagnosis not present

## 2022-05-06 DIAGNOSIS — Z515 Encounter for palliative care: Secondary | ICD-10-CM | POA: Diagnosis not present

## 2022-05-06 DIAGNOSIS — I5023 Acute on chronic systolic (congestive) heart failure: Secondary | ICD-10-CM | POA: Diagnosis not present

## 2022-05-06 LAB — BASIC METABOLIC PANEL
Anion gap: 13 (ref 5–15)
BUN: 34 mg/dL — ABNORMAL HIGH (ref 8–23)
CO2: 27 mmol/L (ref 22–32)
Calcium: 9.9 mg/dL (ref 8.9–10.3)
Chloride: 95 mmol/L — ABNORMAL LOW (ref 98–111)
Creatinine, Ser: 1.06 mg/dL (ref 0.61–1.24)
GFR, Estimated: >60 mL/min (ref >60)
Glucose, Bld: 72 mg/dL (ref 70–99)
Potassium: 4.5 mmol/L (ref 3.5–5.1)
Sodium: 135 mmol/L (ref 135–145)

## 2022-05-06 LAB — COOXEMETRY PANEL
Carboxyhemoglobin: 1.8 % — ABNORMAL HIGH (ref 0.5–1.5)
Methemoglobin: 1.1 % (ref 0.0–1.5)
O2 Saturation: 58.1 %
Total hemoglobin: 10.9 g/dL — ABNORMAL LOW (ref 12.0–16.0)

## 2022-05-06 LAB — CBC
HCT: 31.3 % — ABNORMAL LOW (ref 39.0–52.0)
Hemoglobin: 10.5 g/dL — ABNORMAL LOW (ref 13.0–17.0)
MCH: 23.6 pg — ABNORMAL LOW (ref 26.0–34.0)
MCHC: 33.5 g/dL (ref 30.0–36.0)
MCV: 70.5 fL — ABNORMAL LOW (ref 80.0–100.0)
Platelets: 379 10*3/uL (ref 150–400)
RBC: 4.44 MIL/uL (ref 4.22–5.81)
RDW: 18.9 % — ABNORMAL HIGH (ref 11.5–15.5)
WBC: 9 10*3/uL (ref 4.0–10.5)
nRBC: 0.2 % (ref 0.0–0.2)

## 2022-05-06 LAB — GLUCOSE, CAPILLARY
Glucose-Capillary: 75 mg/dL (ref 70–99)
Glucose-Capillary: 134 mg/dL — ABNORMAL HIGH (ref 70–99)
Glucose-Capillary: 206 mg/dL — ABNORMAL HIGH (ref 70–99)
Glucose-Capillary: 115 mg/dL — ABNORMAL HIGH (ref 70–99)

## 2022-05-06 MED ORDER — SORBITOL 70 % SOLN
30.0000 mL | Freq: Every day | Status: DC | PRN
  Administered 2022-05-07 – 2022-05-09 (×3): 30 mL via ORAL
  Filled 2022-05-06 (×2): qty 30

## 2022-05-06 MED ORDER — INSULIN ASPART 100 UNIT/ML IJ SOLN
4.0000 [IU] | Freq: Three times a day (TID) | INTRAMUSCULAR | Status: DC
  Administered 2022-05-06 (×2): 4 [IU] via SUBCUTANEOUS

## 2022-05-06 MED ORDER — SORBITOL 70 % SOLN: 30.0000 mL | Status: DC

## 2022-05-06 MED ORDER — OXYCODONE HCL 5 MG PO TABS
7.5000 mg | ORAL_TABLET | ORAL | 0 refills | Status: DC | PRN
  Filled 2022-05-06: qty 45, 5d supply, fill #0

## 2022-05-06 MED ORDER — OXYCODONE HCL 5 MG PO TABS
7.5000 mg | ORAL_TABLET | ORAL | Status: DC | PRN
  Administered 2022-05-06 – 2022-05-07 (×5): 7.5 mg via ORAL
  Filled 2022-05-06 (×6): qty 2

## 2022-05-06 MED ORDER — TORSEMIDE 20 MG PO TABS
40.0000 mg | ORAL_TABLET | Freq: Every day | ORAL | Status: DC
  Administered 2022-05-07 – 2022-05-08 (×2): 40 mg via ORAL
  Filled 2022-05-06 (×2): qty 2

## 2022-05-06 MED ORDER — POTASSIUM CHLORIDE CRYS ER 10 MEQ PO TBCR
10.0000 meq | EXTENDED_RELEASE_TABLET | Freq: Every day | ORAL | 5 refills | Status: DC
  Filled 2022-05-06: qty 30, 30d supply, fill #0

## 2022-05-06 MED ORDER — SENNOSIDES-DOCUSATE SODIUM 8.6-50 MG PO TABS
1.0000 | ORAL_TABLET | Freq: Two times a day (BID) | ORAL | Status: DC
  Administered 2022-05-06 – 2022-05-07 (×3): 1 via ORAL
  Filled 2022-05-06 (×3): qty 1

## 2022-05-06 MED ORDER — LOSARTAN POTASSIUM 25 MG PO TABS
12.5000 mg | ORAL_TABLET | Freq: Every day | ORAL | 5 refills | Status: DC
  Filled 2022-05-06: qty 15, 30d supply, fill #0

## 2022-05-06 MED ORDER — DIGOXIN 125 MCG PO TABS
0.0625 mg | ORAL_TABLET | Freq: Every day | ORAL | 5 refills | Status: DC
  Filled 2022-05-06: qty 15, 30d supply, fill #0

## 2022-05-06 MED ORDER — INSULIN DETEMIR 100 UNIT/ML ~~LOC~~ SOLN
15.0000 [IU] | Freq: Two times a day (BID) | SUBCUTANEOUS | Status: DC
  Administered 2022-05-06 – 2022-05-07 (×4): 15 [IU] via SUBCUTANEOUS
  Filled 2022-05-06 (×6): qty 0.15

## 2022-05-06 MED ORDER — FUROSEMIDE 10 MG/ML IJ SOLN: 80.0000 mg | Freq: Once | INTRAMUSCULAR | Status: DC

## 2022-05-06 MED ORDER — METHYLNALTREXONE BROMIDE 12 MG/0.6ML ~~LOC~~ SOLN
4.0000 mg | Freq: Once | SUBCUTANEOUS | Status: AC
  Administered 2022-05-06: 4 mg via SUBCUTANEOUS
  Filled 2022-05-06: qty 0.6

## 2022-05-06 MED ORDER — FUROSEMIDE 10 MG/ML IJ SOLN
60.0000 mg | Freq: Once | INTRAMUSCULAR | Status: AC
  Administered 2022-05-06: 60 mg via INTRAVENOUS
  Filled 2022-05-06: qty 6

## 2022-05-06 NOTE — TOC CM/SW Note (Signed)
HF TOC CM spoke to Palliative, Elmo Putt and pt agreeable to [facility]. Spoke to pt at bedside with Palliative Team and pt agreeable to [place] as he lives closer to [facility] and son lives in [place]. [place] with new referral.  Will be able to arrange Hospice RN admission to [place] this afternoon. [facility] rep, Delsa Sale at pt's room and made aware [facility] accepted referral. Updated attending. [place], Heart Failure TOC CM [phone]

## 2022-05-06 NOTE — Progress Notes (Addendum)
Advanced Heart Failure Rounding Note  PCP-Cardiologist: Fransico Him, MD   Subjective:   09/17: milrinone decreased to 0.125. Sinus tach vs atrial flutter on telemetry. Started IV amio. Ivabradine stopped. 9/18- Milrinone and Heparin stopped. Started back on xarelto.  9/19 Glucose uncontrolled. Insulin adjusted. CVP low . Diuretics held.   CO-OX 58% off milrinone.  Received IV lasix yesterday. CVP 8-9.   Pain under better control today. No dyspnea, orthopnea or PND.  Ready to go home.  R/LHC 04/28/22 RA 12 PA 61/26 (39) PCWP 20 (v 27) Fick CO/CI 3.1/1.7 PVR 6.1 PAPi 2.9 PA sat 45%, 47% 3 v CAD with chronically occluded RCA and LCX. Patent SVGs to R PDA and OM. 90% p and 95% d LAD.     Objective:   Weight Range: 61.6 kg Body mass index is 19.49 kg/m.   Vital Signs:   Temp:  [97.7 F (36.5 C)-99.2 F (37.3 C)] 98.5 F (36.9 C) (09/22 0453) Pulse Rate:  [68] 68 (09/21 0800) Resp:  [14-24] 17 (09/22 0453) BP: (95-124)/(61-77) 106/68 (09/22 0453) SpO2:  [82 %-98 %] 96 % (09/22 0453) Weight:  [61.6 kg] 61.6 kg (09/22 0453) Last BM Date : 05/02/22  Weight change: Filed Weights   05/02/22 0100 05/03/22 0412 05/06/22 0453  Weight: 61.2 kg 61.5 kg 61.6 kg    Intake/Output:   Intake/Output Summary (Last 24 hours) at 05/06/2022 0734 Last data filed at 05/06/2022 0456 Gross per 24 hour  Intake 240 ml  Output 1450 ml  Net -1210 ml      Physical Exam  CVP 8-9 General:  Cachectic elderly male. Chronically ill appearing HEENT: normal Neck: supple. JVP 8-10 cm. Carotids 2+ bilat; no bruits. Cor: PMI nondisplaced. Regular rate & rhythm, tachy. No rubs, gallops or murmurs. Lungs: clear Abdomen: soft, nontender, nondistended. Extremities: no cyanosis, clubbing, rash, edema Neuro: alert & orientedx3, cranial nerves grossly intact. moves all 4 extremities w/o difficulty. Affect pleasan   Telemetry   Sinus tach 100s-110s  Labs    CBC Recent Labs     05/05/22 0443 05/06/22 0535  WBC 9.1 9.0  HGB 10.7* 10.5*  HCT 32.2* 31.3*  MCV 70.9* 70.5*  PLT 364 300   Basic Metabolic Panel Recent Labs    05/05/22 0443 05/06/22 0535  NA 134* 135  K 4.1 4.5  CL 96* 95*  CO2 27 27  GLUCOSE 79 72  BUN 29* 34*  CREATININE 0.91 1.06  CALCIUM 9.9 9.9   Liver Function Tests No results for input(s): "AST", "ALT", "ALKPHOS", "BILITOT", "PROT", "ALBUMIN" in the last 72 hours. No results for input(s): "LIPASE", "AMYLASE" in the last 72 hours. Cardiac Enzymes No results for input(s): "CKTOTAL", "CKMB", "CKMBINDEX", "TROPONINI" in the last 72 hours.  BNP: BNP (last 3 results) Recent Labs    03/08/22 1114 03/30/22 1028 04/27/22 1251  BNP 1,980.1* 1,880.7* 1,466.6*    ProBNP (last 3 results) No results for input(s): "PROBNP" in the last 8760 hours.   D-Dimer No results for input(s): "DDIMER" in the last 72 hours. Hemoglobin A1C No results for input(s): "HGBA1C" in the last 72 hours. Fasting Lipid Panel No results for input(s): "CHOL", "HDL", "LDLCALC", "TRIG", "CHOLHDL", "LDLDIRECT" in the last 72 hours. Thyroid Function Tests No results for input(s): "TSH", "T4TOTAL", "T3FREE", "THYROIDAB" in the last 72 hours.  Invalid input(s): "FREET3"  Other results:   Imaging    No results found.   Medications:     Scheduled Medications:  (feeding supplement) PROSource Plus  30 mL Oral BID BM   amitriptyline  25 mg Oral QHS   Chlorhexidine Gluconate Cloth  6 each Topical Daily   dapagliflozin propanediol  10 mg Oral Daily   digoxin  0.0625 mg Oral Daily   docusate sodium  100 mg Oral Daily   feeding supplement (GLUCERNA SHAKE)  237 mL Oral TID BM   insulin aspart  0-20 Units Subcutaneous TID WC   insulin aspart  0-5 Units Subcutaneous QHS   insulin aspart  5 Units Subcutaneous TID WC   insulin detemir  18 Units Subcutaneous BID   levothyroxine  50 mcg Oral QAC breakfast   losartan  12.5 mg Oral QHS   multivitamin with  minerals  1 tablet Oral Daily   rivaroxaban  20 mg Oral Q supper   simvastatin  20 mg Oral QPM   sodium chloride flush  10-40 mL Intracatheter Q12H   sodium chloride flush  3 mL Intravenous Q12H   sodium chloride flush  3 mL Intravenous Q12H   spironolactone  25 mg Oral Daily    Infusions:  sodium chloride     sodium chloride      PRN Medications: sodium chloride, sodium chloride, acetaminophen, ipratropium-albuterol, magnesium hydroxide, oxyCODONE, polyethylene glycol, sodium chloride flush, sodium chloride flush, sodium chloride flush, zolpidem    Patient Profile   71 y/o male with chronic systolic HF due to ICM with several weeks progressive HF symptoms. Presented 9/14 for outpatient [facility] which demonstrated low cardiac output Fick CO/CI 3.1/1.7. Admitted from cath lab for HF optimization w/ initiation of milrinone and VAD w/u.   Assessment/Plan   Chronic Systolic Heart Failure: - s/p St Jude CRT-D 01/2020. Unable to place LV lead. Interestingly, he had a RBBB during a recent admission and had LBBB on prior ECGs. Discussed with Dr. Lovena Le, with current RBBB a left bundle lead will not be likely to help.  - Had PYP 2020 not suggestive of amyloid.  - Echo (1/23): EF 20-25%, RV significantly reduced, RVSP 50 mmHg, moderate MR, moderate TR, mean gradient 2 mmHg across aortic valve prosthesis. - CPX (3/23): moderate to severe HF limitation. - R/LHC 9/14: RA 12, PA mean 39, PCWP 20, CI 1.7.  - CO-OX marginal 58% off milrinone. Not a candidate for home inotrope. - CVP 8-9. Will give one more dose IV lasix 60 mg then transition to 40 mg Torsemide daily at discharge (starting tomorrow) - Continue Farxiga 10 mg daily.  - Continue digoxin 0.0625 mg daily, reduced on admit d/t dig level of 0.9.  Repeat level 0.6.  - Continue losartan 12.5 mg at bedtime. (failed Entresto in past due to low BP).  - Continue spironolactone 25 mg daily. - off ? blocker due to low output  - off ivabradine  with concern for atrial flutter - Admitted for VAD w/u but CT chest shows progressive metastatic lung CA. - Now DNR/DNI.  - Renal function stable.  - He is not ready to deactivate his ICD   2. CAD - h/o CABG 2013 - LHC this adm: patent SVGs to R PDA and OM. 90% p and 95% d LAD - No chest pain.  - No ASA with Xarelto  - Continue simvastatin 20 mg daily. LDL ok at 65 mg/dL    3. Paroxysmal Afib/?atrial flutter - ? Episode atrial flutter on tele this admit - Currently appears to be ST - CHA2DS2-VASc score is 5 - On Xarelto   4. DM2 - Insulin dependent. - Hgb A1C 9.6  -  On SGLT2i. - Blood sugar better controlled after insulin adjusted.  - Appreciated Diabetes Coordinator input.    - Continue glucerna.  - At d/c will need to restart home metformin.    5. OSA - Continue CPAP.   6. Aortic valve stenosis - S/P TAVR 2019 with 29 mm S3 - Valve okay on echo 01/23   7. Hypothyroid - On levothyroxine.    8. 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 Stage IV Lung CA, squamous cell - previously found to have a 3 cm mass in the superior segment of left lower lobe which was biopsy-proven to be squamous cell carcinoma. - He had some mild mediastinal adenopathy without significant activity on PET scan.   - He was not felt to be candidate for lobectomy due to his severe cardiac disease and underwent SBRT. - chest CT concerning for worsening metastatic lung CA - large 5 cm LLL mass and 3.5 cm pleural-based mass. Bulky necrotic appearing adenopathy. - PET would likely not be approved to be obtained while inpatient. - Pulmonary and Thoracic Oncology met and feel he is not candidate for chemotherapy and thus biopsy of lung massess not pursued. - Dr Sondra Come aware of change.   11. Malnutrition  -Over the last year had 50 pound weight loss.   -Taking supplements. Continue glucerna   12. [place] - Palliative Care following.  - He is DNR/DNI. - For now he wants to hold off on Hospice, discussed again this am. Plan for outpatient palliative medicine.  13. Pain management - Oxycodone per palliative care - Will give short supply of pain meds at discharge, long-term management per Palliative Care.   Planning to discharge home tomorrow with [facility] program under [place]. Spoke with RN, Webb Silversmith, via phone. He is not ready for hospice care at this time.   Barostim implant had been scheduled for 05/19/22, he is hopeful that he would still be a candidate for this. Given the severity of his HF and his overall prognosis, do not feel that he is a good candidate for the procedure.    Length of Stay: 8  FINCH, LINDSAY N, PA-C  05/06/2022, 7:34 AM  Advanced Heart Failure Team Pager [phone] (M-F; 7a - 5p)  Please contact [facility] for night-coverage after hours (5p -7a ) and weekends on [email or web address]   Patient seen and examined with the above-signed Advanced Practice Provider and/or Housestaff. I personally reviewed laboratory data, imaging studies and relevant notes. I independently examined the patient and formulated the important aspects of the plan. I have edited the note to reflect any of my changes or salient points. I have personally discussed the plan with the patient and/or family.  Remains weak. Having severe back pain. Denies SOB, orthopnea or PND. Co-ox 58% CVP 10   Now DNR/DNI.   General:  Weak appearing. Cachetic No resp difficulty HEENT: normal Neck: supple. JVP 9. Carotids 2+ bilat; no bruits. No lymphadenopathy or thryomegaly appreciated. Cor: PMI nondisplaced. Regular tachy +s3 Lungs: crackles left base Abdomen: soft, nontender, nondistended. No hepatosplenomegaly. No bruits or masses. Good bowel sounds. Extremities: no cyanosis, clubbing, rash, edema Neuro: alert &  orientedx3, cranial nerves grossly intact. moves all 4 extremities w/o difficulty. Affect pleasant  He has end-stage HF and stage IV lung CA complicated by severe malnutrition, weakness  and pain.   Has met with Palliative Care team and now will have plans to d/c with Hospice Care.   Need to focus on managing pain effectively.   Glori Bickers, MD  5:03 PM

## 2022-05-06 NOTE — Progress Notes (Signed)
PICC removed per order. Manual pressure held to achieve hemostasis. No complications noted, dressing remains clean/dry/intact at this time. Reviewed post-removal instructions with patient, including lying flat for 95minutes, verbalizes understanding.

## 2022-05-06 NOTE — Progress Notes (Signed)
[facility])   Patient request for [facility] Palliative services at home after discharge.    [facility] hospital liaison will follow patient for discharge disposition.  Please call with any hospice or outpatient palliative care related questions.   Thank you for the opportunity to participate in this patient's care.    Kenna Gilbert RN, BSN [facility] Liaison [phone]

## 2022-05-06 NOTE — Progress Notes (Signed)
Physical Therapy Treatment Patient Details Name: [name] MRN: [id number] DOB: 19-Jun-1951 Today's Date: 05/06/2022   History of Present Illness Patient is a 71 yo male admitted on 04/28/22 with shortness of breath, CHF exacerbation. Mult admissions this year for same.  Pt works as Forensic psychologist for [employer].   PMH includes: CAD, CABG, chronic systolic CHF EF of 77%, status post AICD, aortic stenosis status post TAVR, remote history of squamous cell carcinoma of the left lower lobe status postradiation, CKD stage IIIb, type 2 diabetes, aortic aneurysm, hypertension, CVA, OSA not on CPAP.    PT Comments    Pt tolerates treatment well but demonstrates significant balance deficits with attempts at ambulation without UE support. PT provides recommendation for use of SPC at the time of discharge in order to improve stability with transfers and ambulation. HHPT will also allow for a continued progression of balance and gait training.   Recommendations for follow up therapy are one component of a multi-disciplinary discharge planning process, led by the attending physician.  Recommendations may be updated based on patient status, additional functional criteria and insurance authorization.  Follow Up Recommendations  Home health PT     Assistance Recommended at Discharge PRN  Patient can return home with the following Assistance with cooking/housework;Assist for transportation   Equipment Recommendations  Cane    Recommendations for Other Services       Precautions / Restrictions Precautions Precautions: Fall Precaution Comments: monitor HR Restrictions Weight Bearing Restrictions: No     Mobility  Bed Mobility Overal bed mobility: Modified Independent                  Transfers Overall transfer level: Needs assistance Equipment used: None Transfers: Sit to/from Stand Sit to Stand: Min guard           General transfer comment: posterior loss of balance with initial  sit to stand    Ambulation/Gait Ambulation/Gait assistance: Min guard Gait Distance (Feet): 250 Feet Assistive device: None Gait Pattern/deviations: Step-through pattern, Drifts right/left Gait velocity: reduced Gait velocity interpretation: <1.8 ft/sec, indicate of risk for recurrent falls   General Gait Details: pt with increased sway, multiple lateral losses of balance requiring stepping strategy to correct.   Stairs             Wheelchair Mobility    Modified Rankin (Stroke Patients Only)       Balance Overall balance assessment: Needs assistance Sitting-balance support: No upper extremity supported, Feet supported Sitting balance-Leahy Scale: Good     Standing balance support: No upper extremity supported, During functional activity Standing balance-Leahy Scale: Poor Standing balance comment: minG, multiple lateral losses of balance                            Cognition Arousal/Alertness: Awake/alert Behavior During Therapy: WFL for tasks assessed/performed Overall Cognitive Status: Within Functional Limits for tasks assessed                                          Exercises      General Comments General comments (skin integrity, edema, etc.): VSS on RA      Pertinent Vitals/Pain Pain Assessment Pain Assessment: Faces Faces Pain Scale: Hurts little more Pain Location: back Pain Descriptors / Indicators: Aching Pain Intervention(s): Monitored during session    Home Living  Prior Function            PT Goals (current goals can now be found in the care plan section) Acute Rehab PT Goals Patient Stated Goal: to get back to work Progress towards PT goals: Progressing toward goals    Frequency    Min 3X/week      PT Plan Current plan remains appropriate    Co-evaluation              AM-PAC PT "6 Clicks" Mobility   Outcome Measure  Help needed turning from your  back to your side while in a flat bed without using bedrails?: None Help needed moving from lying on your back to sitting on the side of a flat bed without using bedrails?: None Help needed moving to and from a bed to a chair (including a wheelchair)?: A Little Help needed standing up from a chair using your arms (e.g., wheelchair or bedside chair)?: A Little Help needed to walk in hospital room?: A Little Help needed climbing 3-5 steps with a railing? : A Little 6 Click Score: 20    End of Session   Activity Tolerance: Patient tolerated treatment well Patient left: in bed;with call bell/phone within reach Nurse Communication: Mobility status PT Visit Diagnosis: Unsteadiness on feet (R26.81);Pain;Difficulty in walking, not elsewhere classified (R26.2) Pain - Right/Left: Left Pain - part of body: Shoulder     Time: 7867-5449 PT Time Calculation (min) (ACUTE ONLY): 13 min  Charges:  $Gait Training: 8-22 mins                     Zenaida Niece, PT, DPT Acute Rehabilitation Office [place] 05/06/2022, 5:21 PM

## 2022-05-06 NOTE — Progress Notes (Signed)
Pt declined CPAP for tonight. 

## 2022-05-06 NOTE — TOC Initial Note (Addendum)
Transition of Care [facility]) - Initial/Assessment Note    Patient Details  Name: [name] MRN: [id number] Date of Birth: [birth year]  Transition [facility]) CM/SW Contact:    Bill Rasher, RN Phone Number: [phone] 05/06/2022, 12:24 PM  Clinical Narrative:    HF TOC CM spoke to son, [name] at bedside. Plan dc in am with [facility] (soc Monday) and [facility] they will schedule a visit this weekend with pt and sons. Meds in room from [place].   HF TOC CM received call from [place], rep [place]. Plan is Outpt Palliative with [facility]. They can do an admission tomorrow. [facility] RN will do pain management in the home. [facility] will set up telemonitoring system with scale and blood pressure. Pt will also have [place] RN and PT with [facility]. Sons prefer dc home tomorrow, 05/07/2022. Updated attending.     Contacted [facility] rep, [name]. Will do a start of care on Monday, Sept 25.             HF TOC CM spoke to [place] NP, Palliative and pt has agreed to dc home with Home Hospice. Offered choice and pt wanted hospice closer to his Home in [facility]. [place] and they can arrange follow post dc with Hospice admission this afternoon once pt arrives home. Contacted pt's son [name] and [name] and they will arrange to assist pt in the home. Pt states he does not want RW or hospital bed. Attending updated.   Expected Discharge Plan: Home w Hospice Care Barriers to Discharge: No Barriers Identified   Patient Goals and CMS Choice Patient states their goals for this hospitalization and ongoing recovery are:: wants to go home CMS Medicare.gov Compare Post Acute Care list provided to:: Patient Choice offered to / list presented to : Patient  Expected Discharge Plan and Services Expected Discharge Plan: [place]   Discharge Planning Services: CM Consult Post Acute  Care Choice: Hospice Living arrangements for the past 2 months: Single Family Home Expected Discharge Date: 05/06/22                         HH Arranged: RN [place] Agency: [place] Date [place]: 05/06/22 Time [place]: 1223 Representative spoke with at [place]: [name]  Prior Living Arrangements/Services Living arrangements for the past 2 months: [place] Lives with:: Self Patient language and need for interpreter reviewed:: Yes Do you feel safe going back to the place where you live?: Yes      Need for Family Participation in Patient Care: Yes (Comment) Care giver support system in place?: Yes (comment) Current home services: DME (CPAP, scale) Criminal Activity/Legal Involvement Pertinent to Current Situation/Hospitalization: No - Comment as needed  Activities of Daily Living Home Assistive Devices/Equipment: None ADL Screening (condition at time of admission) Patient's cognitive ability adequate to safely complete daily activities?: Yes Is the patient deaf or have difficulty hearing?: No Does the patient have difficulty seeing, even when wearing glasses/contacts?: No Does the patient have difficulty concentrating, remembering, or making decisions?: No Patient able to express need for assistance with ADLs?: Yes Does the patient have difficulty dressing or bathing?: No Independently performs ADLs?: Yes (appropriate for developmental age) Does the patient have difficulty walking or climbing stairs?: No Weakness of Legs: None Weakness of Arms/Hands: None  Permission  Sought/Granted Permission sought to share information with : Case Manager, Family Supports, PCP Permission granted to share information with : Yes, Verbal Permission Granted  Share Information with NAME: [name]     Permission granted to share info w Relationship: sons  Permission granted to share info w Contact Information: [phone]  Emotional  Assessment Appearance:: Appears stated age Attitude/Demeanor/Rapport: Engaged Affect (typically observed): Accepting Orientation: : Oriented to Self, Oriented to Place, Oriented to  Time, Oriented to Situation   Psych Involvement: No (comment)  Admission diagnosis:  Acute on chronic systolic CHF (congestive heart failure) (HCC) [I50.23] Patient Active Problem List   Diagnosis Date Noted   Hospice care patient 05/06/2022   Protein-calorie malnutrition, severe 04/30/2022   Persistent atrial fibrillation ([place]) 03/23/2022   Secondary hypercoagulable state ([place]) 03/23/2022   Hypothyroidism 09/30/2021   Acute renal failure superimposed on stage 3a chronic kidney disease ([place]) 09/30/2021   Elevated troponin 09/30/2021   Normal anion gap metabolic acidosis 47/65/4650   Acute on chronic systolic (congestive) heart failure ([place]) 09/30/2021   Acute on chronic systolic CHF (congestive heart failure) ([place]) 08/30/2021   Ischemic cardiomyopathy 02/11/2020   Left bundle branch block 01/08/2020   Squamous cell carcinoma of bronchus in left lower lobe ([place]) 11/22/2018   Nodule of lower lobe of left lung 10/27/2018   OSA (obstructive sleep apnea) 35/46/5681   Chronic systolic CHF (congestive heart failure) ([place]) 07/04/2018   S/P TAVR (transcatheter aortic valve replacement) 05/15/2018   History of CVA (cerebrovascular accident)    Cardiomyopathy ([place]) 11/11/2016   Aortic stenosis, severe 06/27/2014   AAA (abdominal aortic aneurysm) (HCC)    Insomnia    Hypertension    SOB (shortness of breath)    Insulin dependent type 2 diabetes mellitus ([place]) 12/28/2012   Dyslipidemia 12/28/2012   CAD (coronary artery disease), native coronary artery 02/13/2012   Hx of radiation therapy to mediastinum 08/16/1983   Malignant seminoma of mediastinum ([place]) 08/16/1983   PCP:  Bill Amel, MD Pharmacy:   [facility] [place], [address] AT [address] [place] [address] Phone: [phone] Fax: [phone]     Social Determinants of Health (SDOH) Interventions    Readmission Risk Interventions     No data to display

## 2022-05-06 NOTE — Progress Notes (Signed)
   This pt was referred to [place] by Jonnie Finner for hospice care at home.  We discussed with him hospice goals and philosophy. He has a lot of extenuating circumstances that he is trying to weed through. He is not ready to make a decision about his active defibrillator and if he would like for this to be deactivate. He also is hopeful and wants to go to the scheduled appointment for the barostim implant which is scheduled for 05/19/22. He has been told this will help his symptoms and from the research we agree that it could. We discussed how he feels about finding out that his cancer has returned and he is unsure if he is a candidate for any treatment related to this. We did talk about he may not be due to his heart function but he would like to follow up and see what the MD has to say or offer him. These are all decisions he wants to further discuss and also get some answers to his questions before he elects to go full comfort care at home. We agree that he should have all the information to make this decision. We have recommended he be seen after d/c with our [facility]. This is a home based Palliative care program. He would receive nursing and SW visits to assist with decreasing his hospitalizations and can continue to have the barostim procedure or any treatment for his cancer under this program. This allows our nurses to help him navigate through the tough decisions and we will be able to  support him if there are no other options as well under hospice care. The pt is in agreement with this. He is eligible for Home Health services with our [facility] as well if this is needed.    We plan to reach out and get orders from the PCP and will enroll when he is ready for d/c to [facility].   Thank you for this referral. Webb Silversmith RN [phone]

## 2022-05-06 NOTE — Progress Notes (Signed)
Palliative:  HPI:  71 y.o. with a history of squamous cell lung cancer LLL 1985, CAD, CABG x2 2013, aortic stenosis s/p TAVR 2019, HFrEF (recent LVEF < 20%), and LBBB, St Jude CRT-D (unable to place LV lead), HTN, OSA. Seen in HF clinic 9/13 for an acute visit - increased dyspnea, fatigue and decline in activity tolerance. Work up in house significant for worsening metastatic lung disease - Stg IV. Palliative care initially involved to discuss and LVAD though no longer a candidate in the setting of mets. Palliative will discuss goals of care and symptom management.   I met today with [name]. He is having significant pain at time of my visit. This prompted further discussion regarding the struggles of managing his symptoms at home to optimize his quality of life. We had a long discussion regarding hospice and he is very hesitant initially but when I explained that having hospice is less about dying but more about helping have the help he needs to live as well as he can for as long as he can. I explained that having hospice does not change his prognosis but will provide him with support to manage his pain to feel better and minimize his suffering at home. I was very clear with [name] that I do not want him to feel like I am forcing him into hospice but I am very concerned that without hospice he will not have the care he needs at home. After some consideration he agrees that hospice will be helpful but this is still very scary for him. I explained that this is more like a safety net of support and if for some reason he has hospice and he does not like it he can sign off but I doubt this will be the case. [name] agrees acknowledging that he does not want to go home and be in pain as he is here.   I received call from son, [name], and daughter-in-law regarding plans. Ultimately their main concern is they were surprised to learn that he was discharging today and then they were told that he would need  24/7 care at home as well. They are not prepared to be able to provide that beginning today and want to ensure that he has what he needs. We discussed overall condition, poor prognosis, and his desire for home with hospice. Family were questioning hospice facility placement but I do not feel that [name] is mentally prepared for this transition. He wants to be comfortable and not suffer but struggling with the feeling that he is "giving up." I did explain that 24/7 support is ideal but he is able to ambulate some and he is in his right mind to call for help if needed. Now he also could have a sudden change in status due to his heart condition and could have sudden decline at any time. I did discuss that transition to hospice facility can be done from home if needed. Family is requesting that we plan for discharge tomorrow if at all possible so they can prepare to better support him in his home.   I had multiple conversations with medical team, TOC, family, and hospice throughout the day. Plan forward further complicated by [name]'s discussion with hospice liasion regarding plans for barostim implant or treatment for cancer. I am under the impression that intervention for his heart is not thought to be benefits vs risks due to underlying cancer as well. I am also under the impression  that there are no plans or options to treat his cancer due to advanced heart disease. I will plan to obtain clarification and speak with [name] again tomorrow.   All questions/concerns addressed to best of my ability. Emotional support provided.   Exam: Alert, oriented. Thin, cachectic. Distressed with pain. Breathing stable. Abd flat. Moves all extremities.   Plan: - DNR previously established.  - Complicated conversations today. He needs hospice support but now needs clarification that this will not prevent him from interventions that may be offered to him to prolong life.  - I will follow up again in the morning.   - Increased OxyIR with ongoing pain concerns. Added senokot scheduled and one time sorbitol for constipation.   096 min  Ahsley Attwood, NP Palliative Medicine Team Pager [phone] (Please see [email or web address] for schedule) Team Phone [phone]    Greater than 50%  of this time was spent counseling and coordinating care related to the above assessment and plan

## 2022-05-07 DIAGNOSIS — I5023 Acute on chronic systolic (congestive) heart failure: Secondary | ICD-10-CM | POA: Diagnosis not present

## 2022-05-07 DIAGNOSIS — Z7189 Other specified counseling: Secondary | ICD-10-CM | POA: Diagnosis not present

## 2022-05-07 DIAGNOSIS — R52 Pain, unspecified: Secondary | ICD-10-CM

## 2022-05-07 DIAGNOSIS — Z515 Encounter for palliative care: Secondary | ICD-10-CM | POA: Diagnosis not present

## 2022-05-07 LAB — GLUCOSE, CAPILLARY
Glucose-Capillary: 55 mg/dL — ABNORMAL LOW (ref 70–99)
Glucose-Capillary: 121 mg/dL — ABNORMAL HIGH (ref 70–99)
Glucose-Capillary: 90 mg/dL (ref 70–99)
Glucose-Capillary: 69 mg/dL — ABNORMAL LOW (ref 70–99)
Glucose-Capillary: 293 mg/dL — ABNORMAL HIGH (ref 70–99)

## 2022-05-07 LAB — BASIC METABOLIC PANEL
Anion gap: 13 (ref 5–15)
BUN: 33 mg/dL — ABNORMAL HIGH (ref 8–23)
CO2: 26 mmol/L (ref 22–32)
Calcium: 10.5 mg/dL — ABNORMAL HIGH (ref 8.9–10.3)
Chloride: 93 mmol/L — ABNORMAL LOW (ref 98–111)
Creatinine, Ser: 0.97 mg/dL (ref 0.61–1.24)
GFR, Estimated: >60 mL/min (ref >60)
Glucose, Bld: 87 mg/dL (ref 70–99)
Potassium: 4.8 mmol/L (ref 3.5–5.1)
Sodium: 132 mmol/L — ABNORMAL LOW (ref 135–145)

## 2022-05-07 LAB — CBC
HCT: 33.6 % — ABNORMAL LOW (ref 39.0–52.0)
Hemoglobin: 10.8 g/dL — ABNORMAL LOW (ref 13.0–17.0)
MCH: 23.1 pg — ABNORMAL LOW (ref 26.0–34.0)
MCHC: 32.1 g/dL (ref 30.0–36.0)
MCV: 71.9 fL — ABNORMAL LOW (ref 80.0–100.0)
Platelets: 380 10*3/uL (ref 150–400)
RBC: 4.67 MIL/uL (ref 4.22–5.81)
RDW: 19.2 % — ABNORMAL HIGH (ref 11.5–15.5)
WBC: 8.5 10*3/uL (ref 4.0–10.5)
nRBC: 0.2 % (ref 0.0–0.2)

## 2022-05-07 MED ORDER — ALUM & MAG HYDROXIDE-SIMETH 200-200-20 MG/5ML PO SUSP
30.0000 mL | Freq: Once | ORAL | Status: AC
  Administered 2022-05-07: 30 mL via ORAL
  Filled 2022-05-07: qty 30

## 2022-05-07 MED ORDER — BISACODYL 10 MG RE SUPP
10.0000 mg | Freq: Once | RECTAL | Status: AC
  Administered 2022-05-07: 10 mg via RECTAL
  Filled 2022-05-07: qty 1

## 2022-05-07 MED ORDER — FLEET ENEMA 7-19 GM/118ML RE ENEM
1.0000 | ENEMA | Freq: Every day | RECTAL | Status: DC | PRN
  Filled 2022-05-07: qty 1

## 2022-05-07 MED ORDER — SENNOSIDES-DOCUSATE SODIUM 8.6-50 MG PO TABS
2.0000 | ORAL_TABLET | Freq: Two times a day (BID) | ORAL | Status: DC
  Administered 2022-05-07 – 2022-05-09 (×4): 2 via ORAL
  Filled 2022-05-07 (×4): qty 2

## 2022-05-07 MED ORDER — OXYCODONE HCL 5 MG PO TABS: 7.5000 mg | ORAL_TABLET | ORAL | Status: DC | PRN

## 2022-05-07 MED ORDER — OXYCODONE HCL 5 MG PO TABS
10.0000 mg | ORAL_TABLET | ORAL | Status: DC | PRN
  Administered 2022-05-07 – 2022-05-09 (×9): 10 mg via ORAL
  Filled 2022-05-07 (×9): qty 2

## 2022-05-07 NOTE — Progress Notes (Signed)
Palliative:  HPI:  71 y.o. with a history of squamous cell lung cancer LLL 1985, CAD, CABG x2 2013, aortic stenosis s/p TAVR 2019, HFrEF (recent LVEF < 20%), and LBBB, St Jude CRT-D (unable to place LV lead), HTN, OSA. Seen in HF clinic 9/13 for an acute visit - increased dyspnea, fatigue and decline in activity tolerance. Work up in house significant for worsening metastatic lung disease - Stg IV. Palliative care initially involved to discuss and LVAD though no longer a candidate in the setting of mets. Palliative will discuss goals of care and symptom management.   I met today with [name] after reviewing records and obtaining clarification regarding options moving forward. Clarification from Dr. Haroldine Laws that barostim implant is no longer an option for him. Records review note conversations with Dr. Lake Bells and Dr. Julien Nordmann that there are no options for treatment therefore aggressive work up was not indicated. I explained this to [name] that unfortunately his advanced cancer and his advanced heart disease each limit treatment of the other. I explained that his body is not strong enough for the benefits to outweigh the risks of these interventions. [name] expresses understanding. He expresses more acceptance today that he is nearing the end of his life.   With this acceptance of of prognosis he also expresses his desire to focus priority of his care on relief of his pain and symptoms accepting the consequences and side effects of increased pain management. He tells me that he does not want to be given "loads of morphine" and I explain that we have a lot of room to maneuver with pain medication to try and give him relief but also allow him to continue to function and spend time with his family. I was also clear that this may not always be the case as his disease and pain progresses. However I also feel that once his constipation is relieved that he will have better controlled pain.   [name]  expresses to me that if his time is limited he would rather just stay here in the hospital. I did explain that if there are no further medical interventions available to keep him in the hospital we will need to continue to consider our options moving forward. For now he wants to continue to work to improve his constipation and pain today and once these symptoms have improved speak more about where to go from here. I do believe he is anxious to return home as he lives alone. Family are supportive but unable to be with him 24/7. Of note, [name] gives me permission to speak with either of his sons to share information at any time.   I did call and update son, [name]. [name] expresses understanding and has good understanding of his father's poor prognosis. [name] just wants to ensure that his father is cared for well for the rest of his life.   All questions/concerns addressed. Emotional support provided.   Exam: Alert, oriented. Thin, frail, cachectic. Fatigue. No distress. Breathing regular, unlabored. Abd distended. Moves all extremities.   Plan: - DNR - Constipation: Increase senokot 2 tabs twice daily. Sorbitol given. Dulcolax suppository x 1 and if no results will give fleets enema. LBM 9/23.  - Pain: Abd, flank, low back: OxyIR 10 mg every 3 hours PRN. Pain improved with this regimen per RN.  - I believe he is now more open to hospice but he is now questioning if he should return home. Will reassess and discuss  further once symptoms improved as he may feel differently.   Mead, NP Palliative Medicine Team Pager [phone] (Please see [email or web address] for schedule) Team Phone [phone]    Greater than 50%  of this time was spent counseling and coordinating care related to the above assessment and plan

## 2022-05-07 NOTE — Progress Notes (Signed)
Having GI upset  after had orange juice dt BS 55. Maalox given as standing order. Plan of care ongoing. No Bm still.

## 2022-05-07 NOTE — Progress Notes (Addendum)
Advanced Heart Failure Rounding Note  PCP-Cardiologist: Fransico Him, MD   Subjective:   09/17: milrinone decreased to 0.125. Sinus tach vs atrial flutter on telemetry. Started IV amio. Ivabradine stopped. 9/18- Milrinone and Heparin stopped. Started back on xarelto.  9/19 Glucose uncontrolled. Insulin adjusted. CVP low . Diuretics held.   05/07/22: No significant changes overnight, pain is better control, however, having trouble maintaining appropriate nutrition.   R/LHC 04/28/22 RA 12 PA 61/26 (39) PCWP 20 (v 27) Fick CO/CI 3.1/1.7 PVR 6.1 PAPi 2.9 PA sat 45%, 47% 3 v CAD with chronically occluded RCA and LCX. Patent SVGs to R PDA and OM. 90% p and 95% d LAD.     Objective:   Weight Range: 62.2 kg Body mass index is 19.67 kg/m.   Vital Signs:   Temp:  [97.8 F (36.6 C)-98.7 F (37.1 C)] 98.7 F (37.1 C) (09/23 0824) Pulse Rate:  [106-110] 106 (09/23 0824) Resp:  [18-22] 22 (09/23 0341) BP: (96-104)/(57-65) 99/61 (09/23 0824) SpO2:  [90 %-100 %] 99 % (09/23 0824) Weight:  [62.2 kg] 62.2 kg (09/23 0341) Last BM Date : 05/02/22  Weight change: Filed Weights   05/03/22 0412 05/06/22 0453 05/07/22 0341  Weight: 61.5 kg 61.6 kg 62.2 kg    Intake/Output:   Intake/Output Summary (Last 24 hours) at 05/07/2022 1211 Last data filed at 05/07/2022 1116 Gross per 24 hour  Intake 300 ml  Output 2250 ml  Net -1950 ml       Physical Exam  CVP 8-9 General:  Cachectic elderly male. Chronically ill appearing HEENT: normal Neck: supple. JVP 8-10 with small V waves Cor: PMI nondisplaced. Regular rate & rhythm, tachy. No rubs, gallops or murmurs. Lungs: clear Abdomen: soft, nontender, nondistended. Extremities: no cyanosis, clubbing, rash, edema Neuro: alert & orientedx3, cranial nerves grossly intact. moves all 4 extremities w/o difficulty. Affect pleasan   Telemetry   Sinus tach 100s-110s  Labs    CBC Recent Labs    05/06/22 0535 05/07/22 0356  WBC  9.0 8.5  HGB 10.5* 10.8*  HCT 31.3* 33.6*  MCV 70.5* 71.9*  PLT 379 301    Basic Metabolic Panel Recent Labs    05/06/22 0535 05/07/22 0356  NA 135 132*  K 4.5 4.8  CL 95* 93*  CO2 27 26  GLUCOSE 72 87  BUN 34* 33*  CREATININE 1.06 0.97  CALCIUM 9.9 10.5*    Liver Function Tests No results for input(s): "AST", "ALT", "ALKPHOS", "BILITOT", "PROT", "ALBUMIN" in the last 72 hours. No results for input(s): "LIPASE", "AMYLASE" in the last 72 hours. Cardiac Enzymes No results for input(s): "CKTOTAL", "CKMB", "CKMBINDEX", "TROPONINI" in the last 72 hours.  BNP: BNP (last 3 results) Recent Labs    03/08/22 1114 03/30/22 1028 04/27/22 1251  BNP 1,980.1* 1,880.7* 1,466.6*     ProBNP (last 3 results) No results for input(s): "PROBNP" in the last 8760 hours.   D-Dimer No results for input(s): "DDIMER" in the last 72 hours. Hemoglobin A1C No results for input(s): "HGBA1C" in the last 72 hours. Fasting Lipid Panel No results for input(s): "CHOL", "HDL", "LDLCALC", "TRIG", "CHOLHDL", "LDLDIRECT" in the last 72 hours. Thyroid Function Tests No results for input(s): "TSH", "T4TOTAL", "T3FREE", "THYROIDAB" in the last 72 hours.  Invalid input(s): "FREET3"  Other results:   Imaging    No results found.   Medications:     Scheduled Medications:  (feeding supplement) PROSource Plus  30 mL Oral BID BM   amitriptyline  25  mg Oral QHS   bisacodyl  10 mg Rectal Once   Chlorhexidine Gluconate Cloth  6 each Topical Daily   dapagliflozin propanediol  10 mg Oral Daily   digoxin  0.0625 mg Oral Daily   feeding supplement (GLUCERNA SHAKE)  237 mL Oral TID BM   insulin aspart  0-20 Units Subcutaneous TID WC   insulin aspart  0-5 Units Subcutaneous QHS   insulin aspart  4 Units Subcutaneous TID WC   insulin detemir  15 Units Subcutaneous BID   levothyroxine  50 mcg Oral QAC breakfast   losartan  12.5 mg Oral QHS   multivitamin with minerals  1 tablet Oral Daily    rivaroxaban  20 mg Oral Q supper   senna-docusate  2 tablet Oral BID   simvastatin  20 mg Oral QPM   spironolactone  25 mg Oral Daily   torsemide  40 mg Oral Daily    Infusions:    PRN Medications: acetaminophen, ipratropium-albuterol, magnesium hydroxide, oxyCODONE, polyethylene glycol, sodium phosphate, sorbitol, zolpidem    Patient Profile   71 y/o male with chronic systolic HF due to ICM with several weeks progressive HF symptoms. Presented 9/14 for outpatient [facility] which demonstrated low cardiac output Fick CO/CI 3.1/1.7. Admitted from cath lab for HF optimization w/ initiation of milrinone and VAD w/u. Unfortunately VAD evaluation stopped after discovery of stage IV lung cancer.   Assessment/Plan   Chronic Systolic Heart Failure: - s/p St Jude CRT-D 01/2020. Unable to place LV lead. Interestingly, he had a RBBB during a recent admission and had LBBB on prior ECGs. Discussed with Dr. Lovena Le, with current RBBB a left bundle lead will not be likely to help.  - Had PYP 2020 not suggestive of amyloid.  - Echo (1/23): EF 20-25%, RV significantly reduced, RVSP 50 mmHg, moderate MR, moderate TR, mean gradient 2 mmHg across aortic valve prosthesis. - CPX (3/23): moderate to severe HF limitation. - R/LHC 9/14: RA 12, PA mean 39, PCWP 20, CI 1.7.  - Continue Farxiga 10 mg daily.  - Continue digoxin 0.0625 mg daily, reduced on admit d/t dig level of 0.9.  Repeat level 0.6.  - Continue losartan 12.5 mg at bedtime. (failed Entresto in past due to low BP).  - Continue spironolactone 25 mg daily. - off ? blocker due to low output &  ivabradine with concern for atrial flutter - Now DNR/DNI but not ready to deactivate his ICD - renal function stable; will obtain digoxin level tomorrow AM. Appears euvolemic to mildly hypervolemic on torsemide 19m daily, will monitor with possible increase in diuretics tomorrow.   2. CAD - h/o CABG 2013 - LHC this adm: patent SVGs to R PDA and OM. 90% p and  95% d LAD - No chest pain.  - No ASA with Xarelto  - Continue simvastatin 20 mg daily.    3. Paroxysmal Afib/?atrial flutter - ? Episode atrial flutter on tele this admit - Currently appears to be ST - CHA2DS2-VASc score is 5 - On Xarelto   4. DM2 - Insulin dependent. - Hgb A1C 9.6  - On SGLT2i. - Blood sugar better controlled after insulin adjusted.  - Appreciated Diabetes Coordinator input.    - Continue glucerna.  - At d/c will need to restart home metformin.    5. OSA - Continue CPAP.   6. Aortic valve stenosis - S/P TAVR 2019 with 29 mm S3 - Valve okay on echo 01/23   7. Hypothyroid - On levothyroxine.  8. 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 Stage IV Lung CA, squamous cell - previously found to have a 3 cm mass in the superior segment of left lower lobe which was biopsy-proven to be squamous cell carcinoma. - He had some mild mediastinal adenopathy without significant activity on PET scan.   - He was not felt to be candidate for lobectomy due to his severe cardiac disease and underwent SBRT. - chest CT concerning for worsening metastatic lung CA - large 5 cm LLL mass and 3.5 cm pleural-based mass. Bulky necrotic appearing adenopathy. - PET would likely not be approved to be obtained while inpatient. - Pulmonary and Thoracic Oncology met and feel he is not candidate for chemotherapy and thus biopsy of lung massess not pursued. - Dr Sondra Come aware of change.   11. Malnutrition  -Over the last year had 50 pound weight loss.  -Taking supplements. Continue glucerna   12. [place] - Palliative Care following.  - He is DNR/DNI. - For now he wants to hold off on Hospice, discussed again this am. Plan for outpatient palliative medicine.  13. Pain management - Oxycodone per palliative care - Will give short supply of pain  meds at discharge, long-term management per Palliative Care.   Planning to discharge home tomorrow with [facility] under [place]. Spoke with RN, Webb Silversmith, via phone. He is not ready for hospice care at this time.   Barostim implant had been scheduled for 05/19/22, he is hopeful that he would still be a candidate for this. Given the severity of his HF and his overall prognosis, do not feel that he is a good candidate for the procedure.   Jakeim Sedore Advanced Heart Failure 12:14 PM

## 2022-05-08 DIAGNOSIS — Z7189 Other specified counseling: Secondary | ICD-10-CM | POA: Diagnosis not present

## 2022-05-08 DIAGNOSIS — Z66 Do not resuscitate: Secondary | ICD-10-CM | POA: Diagnosis not present

## 2022-05-08 DIAGNOSIS — Z515 Encounter for palliative care: Secondary | ICD-10-CM | POA: Diagnosis not present

## 2022-05-08 LAB — BASIC METABOLIC PANEL
Anion gap: 12 (ref 5–15)
BUN: 37 mg/dL — ABNORMAL HIGH (ref 8–23)
CO2: 26 mmol/L (ref 22–32)
Calcium: 9.9 mg/dL (ref 8.9–10.3)
Chloride: 96 mmol/L — ABNORMAL LOW (ref 98–111)
Creatinine, Ser: 1.18 mg/dL (ref 0.61–1.24)
GFR, Estimated: >60 mL/min (ref >60)
Glucose, Bld: 48 mg/dL — ABNORMAL LOW (ref 70–99)
Potassium: 4.5 mmol/L (ref 3.5–5.1)
Sodium: 134 mmol/L — ABNORMAL LOW (ref 135–145)

## 2022-05-08 LAB — CBC
HCT: 31 % — ABNORMAL LOW (ref 39.0–52.0)
Hemoglobin: 10.2 g/dL — ABNORMAL LOW (ref 13.0–17.0)
MCH: 23.3 pg — ABNORMAL LOW (ref 26.0–34.0)
MCHC: 32.9 g/dL (ref 30.0–36.0)
MCV: 70.8 fL — ABNORMAL LOW (ref 80.0–100.0)
Platelets: 382 10*3/uL (ref 150–400)
RBC: 4.38 MIL/uL (ref 4.22–5.81)
RDW: 19.1 % — ABNORMAL HIGH (ref 11.5–15.5)
WBC: 8.8 10*3/uL (ref 4.0–10.5)
nRBC: 0.2 % (ref 0.0–0.2)

## 2022-05-08 LAB — GLUCOSE, CAPILLARY
Glucose-Capillary: 47 mg/dL — ABNORMAL LOW (ref 70–99)
Glucose-Capillary: 60 mg/dL — ABNORMAL LOW (ref 70–99)
Glucose-Capillary: 83 mg/dL (ref 70–99)
Glucose-Capillary: 343 mg/dL — ABNORMAL HIGH (ref 70–99)
Glucose-Capillary: 83 mg/dL (ref 70–99)
Glucose-Capillary: 229 mg/dL — ABNORMAL HIGH (ref 70–99)

## 2022-05-08 LAB — DIGOXIN LEVEL: Digoxin Level: 0.5 ng/mL — ABNORMAL LOW (ref 0.8–2.0)

## 2022-05-08 MED ORDER — INSULIN DETEMIR 100 UNIT/ML ~~LOC~~ SOLN
10.0000 [IU] | Freq: Two times a day (BID) | SUBCUTANEOUS | Status: DC
  Administered 2022-05-08: 10 [IU] via SUBCUTANEOUS
  Filled 2022-05-08 (×3): qty 0.1

## 2022-05-08 MED ORDER — BISACODYL 10 MG RE SUPP
10.0000 mg | Freq: Every day | RECTAL | Status: DC | PRN
  Filled 2022-05-08: qty 1

## 2022-05-08 NOTE — Inpatient Diabetes Management (Signed)
Inpatient Diabetes Program Recommendations  AACE/ADA: New Consensus Statement on Inpatient Glycemic Control (2015)  Target Ranges:  Prepandial:   less than 140 mg/dL      Peak postprandial:   less than 180 mg/dL (1-2 hours)      Critically ill patients:  140 - 180 mg/dL   Lab Results  Component Value Date   GLUCAP 83 05/08/2022   HGBA1C 9.6 (H) 04/29/2022    Review of Glycemic Control  Latest Reference Range & Units 05/07/22 06:07 05/07/22 06:41 05/07/22 11:34 05/07/22 17:11 05/07/22 20:46 05/08/22 06:04 05/08/22 06:16 05/08/22 06:34  Glucose-Capillary 70 - 99 mg/dL 55 (L) 121 (H) 90 69 (L) 293 (H) 47 (L) 60 (L) 83  (L): Data is abnormally low (H): Data is abnormally high (H): Data is abnormally high (L): Data is abnormally low  Diabetes history: DM2  Outpatient Diabetes medications:  Lantus 20-24 units QAM, 10 units QHS Humalog SSI with BF and dinner Farxiga 10 mg QD Metformin 1000 mg BID  Current orders for Inpatient glycemic control:  Levemir 15 units BID  Novolog 0-20 units TID and 0-5 units QHS Farxiga 10 mg QD  Inpatient Diabetes Program Recommendations:    Sam, RN paged DM coordinator concerned for multiple episodes of hypoglycemia over the last few days.  Please consider:  Levemir 12 units QHS (61.4 kg x 0.2 units) Novolog 0-15 units TID  Will continue to follow while inpatient.  Thank you, Reche Dixon, MSN, [place] Diabetes Coordinator Inpatient Diabetes Program [phone] (team pager from 8a-5p)

## 2022-05-08 NOTE — Progress Notes (Signed)
Hypoglycemic Event  CBG: 47  Treatment: 8 oz regular soda  Symptoms: shakiness  Follow-up CBG: Time: 0617 CBG Result:60  Possible Reasons for Event: unknown  Comments/MD notified: Pt still drinking regular soda and eating graham crackers and peanut butter.  Pt states he feels better at this time.  Will recheck patient again in 15 minutes.     Tressie Stalker

## 2022-05-08 NOTE — Progress Notes (Addendum)
   Palliative Medicine Inpatient Follow Up Note  Patient spoken to regarding deactivation of his AICD. He and his son, [name] are in agreement with deactivation.   [employer] called for the above purpose. Rep. paged. I spoke to Roanna [name] who shared that they will have it deactivated before 9AM.   No Charge ______________________________________________________________________________________ [place] Team Team Cell Phone: [phone] Please utilize secure chat with additional questions, if there is no response within 30 minutes please call the above phone number  Palliative Medicine Team providers are available by phone from 7am to 7pm daily and can be reached through the team cell phone.  Should this patient require assistance outside of these hours, please call the patient's attending physician.

## 2022-05-08 NOTE — Progress Notes (Signed)
  Advanced Heart Failure Rounding Note  PCP-Cardiologist: Traci Turner, MD   Subjective:   09/17: milrinone decreased to 0.125. Sinus tach vs atrial flutter on telemetry. Started IV amio. Ivabradine stopped. 9/18- Milrinone and Heparin stopped. Started back on xarelto.  9/19 Glucose uncontrolled. Insulin adjusted. CVP low . Diuretics held.   05/08/22: No significant events overnight; hemodynamically stable. Pain is well controlled. Still reports to poor appetite.    Objective:   Weight Range: 61.4 kg Body mass index is 19.41 kg/m.   Vital Signs:   Temp:  [97.6 F (36.4 C)-98.3 F (36.8 C)] 98.3 F (36.8 C) (09/24 0511) Pulse Rate:  [106-112] 110 (09/24 0511) Resp:  [18-19] 19 (09/24 0511) BP: (94-110)/(61-71) 110/71 (09/24 0829) SpO2:  [96 %-100 %] 99 % (09/24 0511) Weight:  [61.4 kg] 61.4 kg (09/24 0005) Last BM Date : 05/07/22  Weight change: Filed Weights   05/06/22 0453 05/07/22 0341 05/08/22 0005  Weight: 61.6 kg 62.2 kg 61.4 kg    Intake/Output:   Intake/Output Summary (Last 24 hours) at 05/08/2022 1047 Last data filed at 05/08/2022 0830 Gross per 24 hour  Intake 720 ml  Output 1850 ml  Net -1130 ml       Physical Exam   General:  Cachectic elderly male. Chronically ill appearing HEENT: normal Neck: supple. JVP 8-10 with small V waves Cor: PMI nondisplaced. Regular rate & rhythm, tachy. No rubs, gallops or murmurs. Lungs: clear Abdomen: soft, nontender, nondistended. Extremities: no cyanosis, clubbing, rash, edema Neuro: alert & orientedx3, cranial nerves grossly intact. moves all 4 extremities w/o difficulty. Affect pleasan   Telemetry   Sinus tach 100s-110s  Labs    CBC Recent Labs    05/07/22 0356 05/08/22 0555  WBC 8.5 8.8  HGB 10.8* 10.2*  HCT 33.6* 31.0*  MCV 71.9* 70.8*  PLT 380 382    Basic Metabolic Panel Recent Labs    05/07/22 0356 05/08/22 0555  NA 132* 134*  K 4.8 4.5  CL 93* 96*  CO2 26 26  GLUCOSE 87 48*   BUN 33* 37*  CREATININE 0.97 1.18  CALCIUM 10.5* 9.9   BNP: BNP (last 3 results) Recent Labs    03/08/22 1114 03/30/22 1028 04/27/22 1251  BNP 1,980.1* 1,880.7* 1,466.6*     R/LHC 04/28/22 RA 12 PA 61/26 (39) PCWP 20 (v 27) Fick CO/CI 3.1/1.7 PVR 6.1 PAPi 2.9 PA sat 45%, 47% 3 v CAD with chronically occluded RCA and LCX. Patent SVGs to R PDA and OM. 90% p and 95% d LAD.    Imaging  Pertinent imaging reviewed  Medications:     Scheduled Medications:  (feeding supplement) PROSource Plus  30 mL Oral BID BM   amitriptyline  25 mg Oral QHS   Chlorhexidine Gluconate Cloth  6 each Topical Daily   dapagliflozin propanediol  10 mg Oral Daily   digoxin  0.0625 mg Oral Daily   feeding supplement (GLUCERNA SHAKE)  237 mL Oral TID BM   insulin aspart  0-20 Units Subcutaneous TID WC   insulin aspart  0-5 Units Subcutaneous QHS   insulin detemir  15 Units Subcutaneous BID   levothyroxine  50 mcg Oral QAC breakfast   losartan  12.5 mg Oral QHS   multivitamin with minerals  1 tablet Oral Daily   rivaroxaban  20 mg Oral Q supper   senna-docusate  2 tablet Oral BID   simvastatin  20 mg Oral QPM   spironolactone  25 mg Oral Daily     torsemide  40 mg Oral Daily    Infusions:    PRN Medications: acetaminophen, ipratropium-albuterol, magnesium hydroxide, oxyCODONE, polyethylene glycol, sodium phosphate, sorbitol, zolpidem    Patient Profile   71 y/o male with chronic systolic HF due to ICM with several weeks progressive HF symptoms. Presented 9/14 for outpatient R/LHC which demonstrated low cardiac output Fick CO/CI 3.1/1.7. Admitted from cath lab for HF optimization w/ initiation of milrinone and VAD w/u. Unfortunately VAD evaluation stopped after discovery of stage IV lung cancer.   Assessment/Plan   Chronic Systolic Heart Failure: - s/p St Jude CRT-D 01/2020. Unable to place LV lead. Interestingly, he had a RBBB during a recent admission and had LBBB on prior ECGs.  Discussed with Dr. Taylor, with current RBBB a left bundle lead will not be likely to help.  - Had PYP 2020 not suggestive of amyloid.  - Echo (1/23): EF 20-25%, RV significantly reduced, RVSP 50 mmHg, moderate MR, moderate TR, mean gradient 2 mmHg across aortic valve prosthesis. - CPX (3/23): moderate to severe HF limitation. - R/LHC 9/14: RA 12, PA mean 39, PCWP 20, CI 1.7.  - Continue Farxiga 10 mg daily.  - Continue digoxin 0.0625 mg daily, reduced on admit d/t dig level of 0.9.  Repeat level 0.6.  - Continue losartan 12.5 mg at bedtime. (failed Entresto in past due to low BP).  - Continue spironolactone 25 mg daily. - off ? blocker due to low output &  ivabradine with concern for atrial flutter - Now DNR/DNI but not ready to deactivate his ICD - Small rise in sCr but appears euvolemic on exam (down 2lbs from yesterday). Will continue torsemide 40mg daily, may need to decrease depending on PO intake. Digoxin level not above therapeutic range, continue at current dose.    2. CAD - h/o CABG 2013 - LHC this adm: patent SVGs to R PDA and OM. 90% p and 95% d LAD - No chest pain.  - No ASA with Xarelto  - Continue simvastatin 20 mg daily.    3. Paroxysmal Afib/?atrial flutter - ? Episode atrial flutter on tele this admit - Currently appears to be ST - CHA2DS2-VASc score is 5 - On Xarelto   4. DM2 - Insulin dependent. - Hgb A1C 9.6  - On SGLT2i. - Blood sugar better controlled after insulin adjusted.  - Appreciated Diabetes Coordinator input.    - Continue glucerna.  - At d/c will need to restart home metformin.    5. OSA - Continue CPAP.   6. Aortic valve stenosis - S/P TAVR 2019 with 29 mm S3 - Valve okay on echo 01/23   7. Hypothyroid - On levothyroxine.    8. CKD Stage IIIa - SCr baseline 1.1-1.3 - Stable.   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 Stage IV Lung CA,  squamous cell - previously found to have a 3 cm mass in the superior segment of left lower lobe which was biopsy-proven to be squamous cell carcinoma. - He had some mild mediastinal adenopathy without significant activity on PET scan.   - He was not felt to be candidate for lobectomy due to his severe cardiac disease and underwent SBRT. - chest CT concerning for worsening metastatic lung CA - large 5 cm LLL mass and 3.5 cm pleural-based mass. Bulky necrotic appearing adenopathy. - PET would likely not be approved to be obtained while inpatient. -   Pulmonary and Thoracic Oncology met and feel he is not candidate for chemotherapy and thus biopsy of lung massess not pursued. - Dr Sondra Come aware of change.   11. Malnutrition  -Over the last year had 50 pound weight loss.  -Taking supplements. Continue glucerna   12. [place] - Palliative Care following.  - He is DNR/DNI. - For now he wants to hold off on Hospice, discussed again this am. Plan for outpatient palliative medicine.  13. Pain management - Oxycodone per palliative care - Will give short supply of pain meds at discharge, long-term management per Palliative Care.   Planning to discharge home tomorrow with [facility] under [place]. Spoke with RN, Webb Silversmith, via phone. He is not ready for hospice care at this time.   Barostim implant had been scheduled for 05/19/22, he is hopeful that he would still be a candidate for this. Given the severity of his HF and his overall prognosis, do not feel that he is a good candidate for the procedure.   [name] Advanced Heart Failure 10:47 AM

## 2022-05-08 NOTE — Progress Notes (Signed)
Palliative Medicine Inpatient Follow Up Note HPI: [name] is a 71 y.o. with a history of squamous cell lung cancer LLL 1985, CAD, CABG x2 2013, aortic stenosis s/p TAVR 2019, HFrEF (recent LVEF < 20%), and LBBB, St Jude CRT-D (unable to place LV lead), HTN, OSA. Seen in HF clinic 9/13 for an acute visit - increased dyspnea, fatigue and decline in activity tolerance. Work up in house significant for worsening metastatic lung disease - Stg IV. Palliative care initially involved to discuss and LVAD though no longer a candidate in the setting of mets. Palliative will discuss goals of care and symptom management.   Today's Discussion 05/08/2022  *Please note that this is a verbal dictation therefore any spelling or grammatical errors are due to the "[place] One" system interpretation.  Chart reviewed inclusive of vital signs, progress notes, laboratory results, and diagnostic images.   I met with [name] at bedside this afternoon. Patient is resting comfortably in his recliner chair we discussed his present health state.  We reviewed that he is at the end stage of his disease processes and at this point in time is important to consider what his future will look like.  We discussed in greater detail the thought of hospice.  We reviewed the differences between home and inpatient hospice. Created space and opportunity for patient to explore thoughts feelings and fears regarding current medical situation.  Tyvion himself does not desire to be in any pain or discomfort.  He vocalizes to me that he is eating well and continues to mobilize around the unit.  He shares that his symptom burden is not significant today and that he does have better control of his pain and has had a large bowel movement.  Reviewed that at this point in time job would consider going home with hospice and he shares that his son, [name] can help care for him.  I was able to call [name] to confirm this.  I have verified the  patient will go home with [facility] tomorrow.  Questions and concerns addressed/Palliative Support Provided.   Objective Assessment: Vital Signs Vitals:   05/08/22 0511 05/08/22 0829  BP: 100/67 110/71  Pulse: (!) 110   Resp: 19   Temp: 98.3 F (36.8 C)   SpO2: 99%     Intake/Output Summary (Last 24 hours) at 05/08/2022 1358 Last data filed at 05/08/2022 0830 Gross per 24 hour  Intake 480 ml  Output 1250 ml  Net -770 ml   Last Weight  Most recent update: 05/08/2022 12:09 AM    Weight  61.4 kg (135 lb 4.8 oz)            Gen: Elderly African-American male in no acute distress HEENT: moist mucous membranes CV: Irregular rate and regular rhythm  PULM: On room air breathing is even and unlabored ABD: soft/nontender EXT: Muscle wasting Neuro: Alert and oriented x3  SUMMARY OF RECOMMENDATIONS   - DNAR/DNI - Plan for home [facility] tomorrow confirmed - Ongoing Palliative support  Symptoms: Constipation: - (+) Large BM with symptom improvement - senokot 2 tabs twice daily  - Sorbitol daily PRN - Miralax PRN - Fleet Enema PRN - LBM 9/24  Pain:  - OxyIR 10 mg every 3 hours PRN  Billing based on MDM: High  Problems Addressed: One acute or chronic illness or injury that poses a threat to life or bodily function  Amount and/or Complexity of Data: Category 3:Discussion of management  or test interpretation with external physician/other qualified health care professional/appropriate source (not separately reported)  Risks: Parenteral controlled substances, Decision regarding hospitalization or escalation of hospital care, and Decision not to resuscitate or to de-escalate care because of poor prognosis ______________________________________________________________________________________ [place] Team Team Cell Phone: [phone] Please utilize secure chat with additional questions, if there is no  response within 30 minutes please call the above phone number  Palliative Medicine Team providers are available by phone from 7am to 7pm daily and can be reached through the team cell phone.  Should this patient require assistance outside of these hours, please call the patient's attending physician.

## 2022-05-09 ENCOUNTER — Telehealth (HOSPITAL_COMMUNITY): Payer: Self-pay

## 2022-05-09 ENCOUNTER — Telehealth: Payer: Self-pay | Admitting: *Deleted

## 2022-05-09 ENCOUNTER — Telehealth: Payer: Self-pay

## 2022-05-09 DIAGNOSIS — I5023 Acute on chronic systolic (congestive) heart failure: Secondary | ICD-10-CM | POA: Diagnosis not present

## 2022-05-09 LAB — BASIC METABOLIC PANEL
Anion gap: 10 (ref 5–15)
BUN: 44 mg/dL — ABNORMAL HIGH (ref 8–23)
CO2: 29 mmol/L (ref 22–32)
Calcium: 9.6 mg/dL (ref 8.9–10.3)
Chloride: 94 mmol/L — ABNORMAL LOW (ref 98–111)
Creatinine, Ser: 1.49 mg/dL — ABNORMAL HIGH (ref 0.61–1.24)
GFR, Estimated: 50 mL/min — ABNORMAL LOW (ref >60)
Glucose, Bld: 145 mg/dL — ABNORMAL HIGH (ref 70–99)
Potassium: 4.1 mmol/L (ref 3.5–5.1)
Sodium: 133 mmol/L — ABNORMAL LOW (ref 135–145)

## 2022-05-09 LAB — GLUCOSE, CAPILLARY
Glucose-Capillary: 151 mg/dL — ABNORMAL HIGH (ref 70–99)
Glucose-Capillary: 136 mg/dL — ABNORMAL HIGH (ref 70–99)

## 2022-05-09 MED ORDER — INSULIN DETEMIR 100 UNIT/ML ~~LOC~~ SOLN
12.0000 [IU] | Freq: Every day | SUBCUTANEOUS | Status: DC
  Filled 2022-05-09: qty 0.12

## 2022-05-09 MED ORDER — INSULIN ASPART 100 UNIT/ML IJ SOLN
0.0000 [IU] | Freq: Three times a day (TID) | INTRAMUSCULAR | Status: DC
  Administered 2022-05-09: 3 [IU] via SUBCUTANEOUS

## 2022-05-09 MED ORDER — DIGOXIN 125 MCG PO TABS: 0.0625 mg | ORAL_TABLET | Freq: Every day | ORAL | 5 refills | Status: AC

## 2022-05-09 MED ORDER — POTASSIUM CHLORIDE CRYS ER 10 MEQ PO TBCR: 10.0000 meq | EXTENDED_RELEASE_TABLET | Freq: Every day | ORAL | 5 refills | Status: AC

## 2022-05-09 MED ORDER — TORSEMIDE 20 MG PO TABS
40.0000 mg | ORAL_TABLET | Freq: Every day | ORAL | Status: DC
  Administered 2022-05-09: 40 mg via ORAL
  Filled 2022-05-09: qty 2

## 2022-05-09 MED ORDER — OXYCODONE HCL 5 MG PO TABS: 7.5000 mg | ORAL_TABLET | ORAL | 0 refills | Status: AC | PRN

## 2022-05-09 NOTE — Care Management Important Message (Signed)
Important Message  Patient Details  Name: OMRI BERTRAN MRN: [id number] Date of Birth: [birth year]   Medicare Important Message Given:  Yes     [name] 05/09/2022, 12:27 PM

## 2022-05-09 NOTE — TOC CM/SW Note (Signed)
HF TOC CM spoke to [facility] and they have scheduled Hospice RN to visit pt today at 2 pm. Son scheduled to pick pt up at dc. [place], Heart Failure TOC CM [phone]

## 2022-05-09 NOTE — Telephone Encounter (Signed)
Spoke to [place] and he reports he is home and hospice came out for visit today and just left. I plan to go out for final HF Paramedicine visit on Wednesday. Call complete.   [name], [place] 05/09/2022

## 2022-05-09 NOTE — Inpatient Diabetes Management (Signed)
Inpatient Diabetes Program Recommendations  AACE/ADA: New Consensus Statement on Inpatient Glycemic Control (2015)  Target Ranges:  Prepandial:   less than 140 mg/dL      Peak postprandial:   less than 180 mg/dL (1-2 hours)      Critically ill patients:  140 - 180 mg/dL   Lab Results  Component Value Date   GLUCAP 151 (H) 05/09/2022   HGBA1C 9.6 (H) 04/29/2022    Review of Glycemic Control  Latest Reference Range & Units 05/08/22 06:04 05/08/22 06:16 05/08/22 06:34 05/08/22 11:17 05/08/22 16:57 05/08/22 21:23 05/09/22 05:58  Glucose-Capillary 70 - 99 mg/dL 47 (L) 60 (L) 83 343 (H)  Novolog 15 units 83 229 (H)  Novolog 2 units  Levemir 10 units 151 (H)   Diabetes history: DM2  Outpatient Diabetes medications:  Lantus 20-24 units QAM, 10 units QHS Humalog SSI with BF and dinner Farxiga 10 mg QD Metformin 1000 mg BID  Current orders for Inpatient glycemic control:  Levemir 12 units qhs Novolog 0-15 units TID and 0-5 units QHS Farxiga 10 mg QD  Glucerna tid between meals  Inpatient Diabetes Program Recommendations:    - reduce Levemir to 10 units (pt received 10 units last night) -   Add Novolog 2 units tid meal coverage if eating >50% of meals/supplements to prevent postprandial spikes  Will continue to follow while inpatient.  Thanks,  Tama Headings RN, MSN, BC-ADM Inpatient Diabetes Coordinator Team Pager [phone] (8a-5p)

## 2022-05-09 NOTE — TOC CM/SW Note (Signed)
HF TOC contacted pt's son to verify that he was picking pt up at dc. Contacted son, [name] and states son, [name] will provide transportation home today. Will contact [facility] to make aware of delay in dc. [place], Heart Failure TOC CM [phone]

## 2022-05-09 NOTE — Progress Notes (Signed)
Pt refused cpap tonight. RT will continue to monitor.

## 2022-05-09 NOTE — Progress Notes (Signed)
Occupational Therapy Treatment Patient Details Name: [name] MRN: [id number] DOB: 01-27-1951 Today's Date: 05/09/2022   History of present illness Patient is a 71 yo male admitted on 04/28/22 with shortness of breath, CHF exacerbation. Mult admissions this year for same.  Pt works as Forensic psychologist for [employer].   PMH includes: CAD, CABG, chronic systolic CHF EF of 92%, status post AICD, aortic stenosis status post TAVR, remote history of squamous cell carcinoma of the left lower lobe status postradiation, CKD stage IIIb, type 2 diabetes, aortic aneurysm, hypertension, CVA, OSA not on CPAP.   OT comments  Patient seated on EOB upon entry and agree to OT session. Patient was supervision to wash feet and change socks seated on EOB. Patient stood at sink and performed grooming and bathing tasks. Patient attempted to change underwear while standing and was recommended to perform seated for safety. Patient performed toileting with supervision. Patient continues to have complaints of back pain and was left in recliner with heat applied to back. Patient to continue to be followed by acute OT.    Recommendations for follow up therapy are one component of a multi-disciplinary discharge planning process, led by the attending physician.  Recommendations may be updated based on patient status, additional functional criteria and insurance authorization.    Follow Up Recommendations  Home health OT    Assistance Recommended at Discharge PRN  Patient can return home with the following  A little help with walking and/or transfers;A little help with bathing/dressing/bathroom;Help with stairs or ramp for entrance;Assist for transportation;Assistance with cooking/housework   Equipment Recommendations  Tub/shower seat    Recommendations for Other Services      Precautions / Restrictions Precautions Precautions: Fall Precaution Comments: monitor HR Restrictions Weight Bearing Restrictions: No        Mobility Bed Mobility Overal bed mobility: Modified Independent             General bed mobility comments: seated on EOB upon entry    Transfers Overall transfer level: Needs assistance Equipment used: None Transfers: Sit to/from Stand Sit to Stand: Supervision           General transfer comment: supervision without an assistive device     Balance Overall balance assessment: Needs assistance Sitting-balance support: No upper extremity supported, Feet supported Sitting balance-Leahy Scale: Good Sitting balance - Comments: on EOB   Standing balance support: No upper extremity supported, During functional activity Standing balance-Leahy Scale: Fair Standing balance comment: supervision standing at sink                           ADL either performed or assessed with clinical judgement   ADL Overall ADL's : Needs assistance/impaired     Grooming: Wash/dry hands;Wash/dry face;Oral care;Applying deodorant;Brushing hair;Supervision/safety;Standing Grooming Details (indicate cue type and reason): standing at sink Upper Body Bathing: Supervision/ safety;Standing Upper Body Bathing Details (indicate cue type and reason): at sink Lower Body Bathing: Supervison/ safety;Sit to/from stand Lower Body Bathing Details (indicate cue type and reason): bathed peri area while standing Upper Body Dressing : Supervision/safety;Standing Upper Body Dressing Details (indicate cue type and reason): changed gown Lower Body Dressing: Supervision/safety;Sit to/from stand Lower Body Dressing Details (indicate cue type and reason): changed socks and underwear seated/standing Toilet Transfer: Supervision/safety;Regular Toilet   Toileting- Clothing Manipulation and Hygiene: Supervision/safety;Sitting/lateral lean         General ADL Comments: patient performed self care tasks seated and standing at sink with supervision  Extremity/Trunk Assessment              Vision        Perception     Praxis      Cognition Arousal/Alertness: Awake/alert Behavior During Therapy: WFL for tasks assessed/performed Overall Cognitive Status: Within Functional Limits for tasks assessed                                          Exercises      Shoulder Instructions       General Comments      Pertinent Vitals/ Pain       Pain Assessment Pain Assessment: Faces Faces Pain Scale: Hurts little more Pain Location: back Pain Descriptors / Indicators: Aching Pain Intervention(s): Limited activity within patient's tolerance, Monitored during session, Repositioned, Heat applied  Home Living                                          Prior Functioning/Environment              Frequency  Min 2X/week        Progress Toward Goals  OT Goals(current goals can now be found in the care plan section)  Progress towards OT goals: Progressing toward goals  Acute Rehab OT Goals Patient Stated Goal: go home OT Goal Formulation: With patient Time For Goal Achievement: 05/13/22 Potential to Achieve Goals: Fair ADL Goals Pt Will Perform Grooming: with modified independence;sitting;standing Additional ADL Goal #1: Pt will recall 3 energy conservation techniques that he plans to use at home when discharged. Additional ADL Goal #2: Pt will demonstrate improved activity tolerance while participating in a standing activity for 85minutes with no signs of fatigue, SOB, or change in HR.  Plan Discharge plan remains appropriate    Co-evaluation                 AM-PAC OT "6 Clicks" Daily Activity     Outcome Measure   Help from another person eating meals?: None Help from another person taking care of personal grooming?: A Little Help from another person toileting, which includes using toliet, bedpan, or urinal?: A Little Help from another person bathing (including washing, rinsing, drying)?: A Little Help from another person to  put on and taking off regular upper body clothing?: A Little Help from another person to put on and taking off regular lower body clothing?: A Little 6 Click Score: 19    End of Session    OT Visit Diagnosis: Muscle weakness (generalized) (M62.81)   Activity Tolerance Patient tolerated treatment well   Patient Left in chair;with call bell/phone within reach   Nurse Communication Mobility status        Time: 6269-4854 OT Time Calculation (min): 33 min  Charges: OT General Charges $OT Visit: 1 Visit OT Treatments $Self Care/Home Management : 23-37 mins  Lodema Hong, [place] 05/09/2022, 10:55 AM

## 2022-05-09 NOTE — Progress Notes (Signed)
Patient ID: [name], male   DOB: 06/03/1951, 71 y.o.   MRN: [id number]    Progress Note from the Palliative Medicine Team at [facility]   Patient Name: [name]        Date: 05/09/2022 DOB: 1950/11/11  Age: 71 y.o. MRN#: [id number] Attending Physician: Jolaine Artist, MD Primary Care Physician: [name], MD Admit Date: 04/28/2022   Medical records reviewed   71 y/o male admitted for dyspnea due to advanced heart failure, consideration of VAD placement, found to have lung masses with lymphadenopathy worrisome for recurrent malignancy. Not a candidate for oncologic interventions at this time 2/2 to heart disease.   Multiple co-morbidities AICD,Anemia,Atrial fibrillation, CKD, Systolic heart failure, CAD, Hyperlidpiemia, Hypertension, OSA S/p TAVR, Severe aortic stenosis, DM2, GERD, History of stroke Squamous cell carcinoma of left lung lower lobe  This NP assessed patient at the bedside as a follow up for palliative medicine needs and emotional support, for continued conversation regarding goals of care as he transitions out of the hospital.  Patient is alert and oriented, he is sitting on side of bed "getting situated".  Reports uncontrolled pain thru the night.  Education offered on pain management strategies.    Education offered on the various options as he transitions home; home health with continued paramedic visit along with outpatient palliative care services versus home hospice benefit.  Education offered regarding hospice benefit; philosophy and eligibility.   Encouraged Mr [name] to consider home hospice services, he agrees to speak with hospice liaison.     We explored the patient's likely need for anticipatory care needs.  Patient is weak and has very limited social support at home.       MOST form introduced and completed.  Hard copy placed in chart..  Hard choices booklet was left for review.   Plan of Care: -DNR/DNI -home with HH and PCS--  likely will convert to hospice  -symptom management:       - Oxycodone 5 mg po every 4 hrs prn - (education offered regarding side effects)        - Colace 100 mg po daily  -"take it one day at a time" per Mr [place]  Education offered today regarding  the importance of continued conversation with his family and the medical providers regarding overall plan of care and treatment options,  ensuring decisions are within the context of the patients values and GOCs.  Questions and concerns addressed   Discussed with bedside RN and attending team and [facility] team    Wadie Lessen NP  Palliative Medicine Team Team Phone # [phone] Pager [phone]

## 2022-05-09 NOTE — Progress Notes (Addendum)
Advanced Heart Failure Rounding Note  PCP-Cardiologist: Fransico Him, MD   Subjective:   09/17: milrinone decreased to 0.125. Sinus tach vs atrial flutter on telemetry. Started IV amio. Ivabradine stopped. 9/18- Milrinone and Heparin stopped. Started back on xarelto.  9/19 Glucose uncontrolled. Insulin adjusted. CVP low . Diuretics held.   Good appetite this am. Ate all of his breakfast. Pain controlled and constipation improved.   No dyspnea, orthopnea or PND.   BP soft 44Y systolic, one reading 18H.    Planning for discharge home today with hospice. ICD deactivated this am.   Objective:   Weight Range: 61.4 kg Body mass index is 19.41 kg/m.   Vital Signs:   Temp:  [97.7 F (36.5 C)-98.2 F (36.8 C)] 97.7 F (36.5 C) (09/25 0724) Pulse Rate:  [109-112] 109 (09/25 0724) Resp:  [17-20] 17 (09/25 0724) BP: (76-110)/(51-71) 76/51 (09/25 0724) SpO2:  [93 %-100 %] 100 % (09/25 0724) Weight:  [61.4 kg] 61.4 kg (09/25 0333) Last BM Date : 05/08/22  Weight change: Filed Weights   05/07/22 0341 05/08/22 0005 05/09/22 0333  Weight: 62.2 kg 61.4 kg 61.4 kg    Intake/Output:   Intake/Output Summary (Last 24 hours) at 05/09/2022 0826 Last data filed at 05/08/2022 1836 Gross per 24 hour  Intake 840 ml  Output 600 ml  Net 240 ml      Physical Exam   General:  Cachectic elderly male. Appears chronically ill HEENT: normal Neck: supple. JVP ~ 8 cm. Carotids 2+ bilat; no bruits.  Cor: PMI nondisplaced. Regular rate & rhythm, tachy. No rubs, gallops or murmurs. Lungs: clear Abdomen: soft, nontender, nondistended.  Extremities: no cyanosis, clubbing, rash, edema Neuro: alert & orientedx3, cranial nerves grossly intact. moves all 4 extremities w/o difficulty. Affect pleasant    Telemetry   Sinus tach 100s  Labs    CBC Recent Labs    05/07/22 0356 05/08/22 0555  WBC 8.5 8.8  HGB 10.8* 10.2*  HCT 33.6* 31.0*  MCV 71.9* 70.8*  PLT 380 631   Basic  Metabolic Panel Recent Labs    05/07/22 0356 05/08/22 0555  NA 132* 134*  K 4.8 4.5  CL 93* 96*  CO2 26 26  GLUCOSE 87 48*  BUN 33* 37*  CREATININE 0.97 1.18  CALCIUM 10.5* 9.9  BNP: BNP (last 3 results) Recent Labs    03/08/22 1114 03/30/22 1028 04/27/22 1251  BNP 1,980.1* 1,880.7* 1,466.6*    R/LHC 04/28/22 RA 12 PA 61/26 (39) PCWP 20 (v 27) Fick CO/CI 3.1/1.7 PVR 6.1 PAPi 2.9 PA sat 45%, 47% 3 v CAD with chronically occluded RCA and LCX. Patent SVGs to R PDA and OM. 90% p and 95% d LAD.    Imaging  Pertinent imaging reviewed  Medications:     Scheduled Medications:  (feeding supplement) PROSource Plus  30 mL Oral BID BM   amitriptyline  25 mg Oral QHS   Chlorhexidine Gluconate Cloth  6 each Topical Daily   dapagliflozin propanediol  10 mg Oral Daily   digoxin  0.0625 mg Oral Daily   feeding supplement (GLUCERNA SHAKE)  237 mL Oral TID BM   insulin aspart  0-15 Units Subcutaneous TID WC   insulin aspart  0-5 Units Subcutaneous QHS   insulin detemir  12 Units Subcutaneous QHS   levothyroxine  50 mcg Oral QAC breakfast   multivitamin with minerals  1 tablet Oral Daily   rivaroxaban  20 mg Oral Q supper   senna-docusate  2 tablet Oral BID   simvastatin  20 mg Oral QPM   spironolactone  25 mg Oral Daily    Infusions:    PRN Medications: acetaminophen, bisacodyl, ipratropium-albuterol, magnesium hydroxide, oxyCODONE, polyethylene glycol, sodium phosphate, sorbitol, zolpidem    Patient Profile   71 y/o male with chronic systolic HF due to ICM with several weeks progressive HF symptoms. Presented 9/14 for outpatient [facility] which demonstrated low cardiac output Fick CO/CI 3.1/1.7. Admitted from cath lab for HF optimization w/ initiation of milrinone and VAD w/u. Unfortunately VAD evaluation stopped after discovery of stage IV lung cancer.   Assessment/Plan   Chronic Systolic Heart Failure: - s/p St Jude CRT-D 01/2020. Unable to place LV lead.  Interestingly, he had a RBBB during a recent admission and had LBBB on prior ECGs. Discussed with Dr. Lovena Le, with current RBBB a left bundle lead will not be likely to help.  - Had PYP 2020 not suggestive of amyloid.  - Echo (1/23): EF 20-25%, RV significantly reduced, RVSP 50 mmHg, moderate MR, moderate TR, mean gradient 2 mmHg across aortic valve prosthesis. - CPX (3/23): moderate to severe HF limitation. - R/LHC 9/14: RA 12, PA mean 39, PCWP 20, CI 1.7.  - Continue Farxiga 10 mg daily.  - Continue digoxin 0.0625 mg daily, reduced on admit d/t dig level of 0.9.  Repeat level 0.5 on 09/24.  - Hold losartan and torsemide today in view of soft BP. PO intake has been an issue, but better appetite this am - Continue spironolactone 25 mg daily. - off ? blocker due to low output &  ivabradine with concern for atrial flutter - Barostim implant had been scheduled for 05/19/22. Discussed with research study coordinator. Given the severity of his HF and his overall prognosis, do not feel that he is a good candidate for the procedure.  - Now DNR/DNI. Planning home with hospice. ICD deactivated this am.   2. CAD - h/o CABG 2013 - LHC this adm: patent SVGs to R PDA and OM. 90% p and 95% d LAD - No chest pain.  - No ASA with Xarelto  - Continue simvastatin 20 mg daily.    3. Paroxysmal Afib/?atrial flutter - ? Episode atrial flutter on tele this admit - Currently appears to be ST - CHA2DS2-VASc score is 5 - On Xarelto   4. DM2 - Insulin dependent. - Hgb A1C 9.6  - On SGLT2i. - Blood sugar better controlled after insulin adjusted. Now with hypotensive episodes in setting of poor appetite. Insulin has been adjusted - Appreciated Diabetes Coordinator input.      5. OSA - Continue CPAP.   6. Aortic valve stenosis - S/P TAVR 2019 with 29 mm S3 - Valve okay on echo 01/23   7. Hypothyroid - On levothyroxine.    8. CKD Stage IIIa - SCr baseline 1.1-1.3 - Stable.   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 Stage IV Lung CA, squamous cell - previously found to have a 3 cm mass in the superior segment of left lower lobe which was biopsy-proven to be squamous cell carcinoma. - He had some mild mediastinal adenopathy without significant activity on PET scan.   - He was not felt to be candidate for lobectomy due to his severe cardiac disease and underwent SBRT. - chest CT concerning for worsening metastatic lung CA - large 5 cm LLL mass and 3.5  cm pleural-based mass. Bulky necrotic appearing adenopathy. - PET would likely not be approved to be obtained while inpatient. - Pulmonary and Thoracic Oncology met and feel he is not candidate for chemotherapy and thus biopsy of lung massess not pursued. - Dr Sondra Come aware of change.   11. Malnutrition  -Over the last year had 50 pound weight loss.  -Taking supplements. Continue glucerna   12. Paraje - Palliative Care following.  - He is DNR/DNI. Planning to discharge with hospice care through [place]. - ICD deactivated this am  13. Pain management - Oxycodone per palliative care - Will give short supply of pain meds at discharge, long-term management per Hospice.     Plan for discharge home today. BP soft this am. Holding diuretics. Will recheck blood pressure and decide on outpatient diuretics.   Marlyce Huge, PA-C Advanced Heart Failure 8:26 AM  Patient seen and examined with the above-signed Advanced Practice Provider and/or Housestaff. I personally reviewed laboratory data, imaging studies and relevant notes. I independently examined the patient and formulated the important aspects of the plan. I have edited the note to reflect any of my changes or salient points. I have personally discussed the plan with the patient and/or family.  Continues to deteriorate. BP low. Feels weak. ICD deactivated.   General:  Cachetic  weak appearing. No resp difficulty HEENT: normal Neck: supple. JVP 10 Carotids 2+ bilat; no bruits. No lymphadenopathy or thryomegaly appreciated. Cor: PMI nondisplaced. Regular rate & rhythm. + s3 No rubs, gallops or murmurs. Lungs: basilar crackles Abdomen: soft, nontender, nondistended. No hepatosplenomegaly. No bruits or masses. Good bowel sounds. Extremities: no cyanosis, clubbing, rash, edema Neuro: alert & orientedx3, cranial nerves grossly intact. moves all 4 extremities w/o difficulty. Affect pleasant  He is end-stage. Plan d/c home with Hospice today.   Glori Bickers, MD  9:31 AM

## 2022-05-09 NOTE — Telephone Encounter (Signed)
05/09/2021-Patient tachy therapies disabled per Bensihmoln. Bethann Punches.

## 2022-05-09 NOTE — Telephone Encounter (Signed)
Called patient's son- Cedrick to inquire whether his dad was still in the hospital, patient is still in the hospital, scan will  be scheduled once his dad is released from the hospital

## 2022-05-10 ENCOUNTER — Telehealth: Payer: Self-pay | Admitting: *Deleted

## 2022-05-10 LAB — FACTOR 5 LEIDEN

## 2022-05-10 NOTE — Telephone Encounter (Signed)
CALLED PATIENT TO INFORM OF CT FOR 05-18-22- ARRIVAL TIME-3 PM @ [facility], NO RESTRICTIONS TO TEST, PATIENT TO RECEIVE RESULTS FROM DR. KINARD ON 05-26-22 @ 11:15 AM, SPOKE WITH PATIENT AND HE IS AWARE OF THESE APPTS. AND THE INSTRUCTIONS

## 2022-05-11 ENCOUNTER — Other Ambulatory Visit (HOSPITAL_COMMUNITY): Payer: Self-pay

## 2022-05-11 ENCOUNTER — Inpatient Hospital Stay (HOSPITAL_COMMUNITY): Admission: RE | Admit: 2022-05-11 | Payer: Medicare Other | Source: Ambulatory Visit

## 2022-05-11 NOTE — Progress Notes (Signed)
Paramedicine Encounter    Patient ID: [name], male    DOB: 14-Apr-1951, 71 y.o.   MRN: [id number]   Arrived for home visit for [name] who reports he is feeling weak, short of breath and tired. [name] is involved and RN came out yesterday, CNA in the home at my arrival assisting with giving him a bath. Patieint's son [name] in the home with him reporting that he has been eating and drinking and taking his medicines but just very weak. He has been ambulating around but gets unsteady at times- I suggested him using a walker which they plan to ask for from Hospice.   I called and spoke to Summer, RN with Hospice and confirmed that they would be managing his meds from this point forward. I assisted by setting up a pill box after verifying his meds. Meds set up for one week.   He is taking the following as needed per Hospice: -Haldol -Lorazepam    I called HF clinic to advise them of same and they canceled any upcoming visits.   I informed [name] today is our last paramedicine visit and he was agreeable with this. I advised him he and or his [name] could reach out if needed but, that Hospice would take over care.   I obtained vitals as noted: WT- 130lbs BP- 88/52 HR- 110 RR- 20 O2- 97%  His lung sounds noted to have wheezing and rhonchi throughout. He is coughing with some mucus production.  He did have a nose bleed today but it was controlled with pressure and head tilt.   Home visit completed. Paramedicine stopped as of today. Patient discharged from HF clinic.   Patient is now discharged from [name].  Patient has/has not met the following goals:  Yes :Patient expresses basic understanding of medications and what they are for Yes :Patient able to verbalize heart failure specific dietary/fluid restrictions Yes :Patient is aware of who to call if they have medical concerns or if they need to schedule or change appts Yes :Patient has a scale for  daily weights and weighs regularly Yes :Patient able to verbalize concerning symptoms when they should call the HF clinic (weight gain ranges, etc) Yes :Patient has a PCP and has seen within the past year or has upcoming appt Yes :Patient has reliable access to getting their medications Yes :Patient has shown they are able to reorder medications reliably Yes :Patient has had admission in past 30 days- if yes how many? Yes :Patient has had admission in past 90 days- if yes how many?  Discharge Comments:      [name] [phone] 05/11/2022         Patient Care Team: [name], MD as PCP - General (Family Medicine) [name], MD as PCP - Cardiology (Cardiology) Thompson Grayer, MD as PCP - Electrophysiology (Cardiology)  Patient Active Problem List   Diagnosis Date Noted   Hospice care patient 05/06/2022   Malignant neoplasm of lung [name])    Goals of care, counseling/discussion    Encounter for hospice care discussion    Palliative care by specialist    Protein-calorie malnutrition, severe 04/30/2022   Persistent atrial fibrillation ([place]) 03/23/2022   Secondary hypercoagulable state ([place]) 03/23/2022   Hypothyroidism 09/30/2021   Acute renal failure superimposed on stage 3a chronic kidney disease ([place]) 09/30/2021   Elevated troponin 09/30/2021   Normal anion gap metabolic acidosis 79/89/2119   Acute on chronic systolic (congestive) heart  failure ([place]) 09/30/2021   Acute on chronic systolic CHF (congestive heart failure) ([place]) 08/30/2021   Ischemic cardiomyopathy 02/11/2020   Left bundle branch block 01/08/2020   Squamous cell carcinoma of bronchus in left lower lobe ([place]) 11/22/2018   Nodule of lower lobe of left lung 10/27/2018   OSA (obstructive sleep apnea) 37/90/2409   Chronic systolic CHF (congestive heart failure) ([place]) 07/04/2018   S/P TAVR (transcatheter aortic valve replacement) 05/15/2018   History of CVA (cerebrovascular  accident)    Cardiomyopathy ([place]) 11/11/2016   Aortic stenosis, severe 06/27/2014   AAA (abdominal aortic aneurysm) ([facility])    Insomnia    Hypertension    SOB (shortness of breath)    Insulin dependent type 2 diabetes mellitus ([place]) 12/28/2012   Dyslipidemia 12/28/2012   CAD (coronary artery disease), native coronary artery 02/13/2012   Hx of radiation therapy to mediastinum 08/16/1983   Malignant seminoma of mediastinum ([place]) 08/16/1983    Current Outpatient Medications:    amitriptyline (ELAVIL) 25 MG tablet, Take 25 mg by mouth at bedtime. , Disp: , Rfl:    B Complex-C (SUPER B COMPLEX PO), Take 1 tablet by mouth daily., Disp: , Rfl:    dapagliflozin propanediol (FARXIGA) 10 MG TABS tablet, Take 1 tablet (10 mg total) by mouth daily., Disp: 90 tablet, Rfl: 3   digoxin (LANOXIN) 0.125 MG tablet, Take 1/2 tablet (0.0625 mg total) by mouth daily., Disp: 15 tablet, Rfl: 5   insulin detemir (LEVEMIR) 100 UNIT/ML injection, Inject 10-24 Units into the skin See admin instructions. 20-24 units in the morning, 10 units at bedtime, Disp: , Rfl:    insulin lispro (HUMALOG) 100 UNIT/ML KwikPen, Inject 0-4 Units into the skin See admin instructions. Per sliding scale at Breakfast and Dinner - pt could not tell me his sliding scale., Disp: , Rfl:    levothyroxine (SYNTHROID, LEVOTHROID) 50 MCG tablet, Take 50 mcg by mouth daily before breakfast. , Disp: , Rfl:    oxyCODONE (OXY IR/ROXICODONE) 5 MG immediate release tablet, Take 1.5 tablets (7.5 mg total) by mouth every 4 (four) hours as needed for up to 5 days for moderate pain., Disp: 45 tablet, Rfl: 0   potassium chloride (KLOR-CON M) 10 MEQ tablet, Take 1 tablet (10 mEq total) by mouth daily., Disp: 30 tablet, Rfl: 5   rivaroxaban (XARELTO) 20 MG TABS tablet, Take 1 tablet (20 mg total) by mouth daily with supper. (Patient taking differently: Take 20 mg by mouth daily.), Disp: 28 tablet, Rfl: 0   spironolactone (ALDACTONE) 25 MG tablet, TAKE 1  TABLET(25 MG) BY MOUTH DAILY (Patient taking differently: Take 25 mg by mouth daily.), Disp: 30 tablet, Rfl: 3   torsemide (DEMADEX) 20 MG tablet, Take 2 tablets (40 mg total) by mouth daily., Disp: 180 tablet, Rfl: 3   zolpidem (AMBIEN) 5 MG tablet, Take 5 mg by mouth at bedtime as needed for sleep., Disp: , Rfl:  Allergies  Allergen Reactions   Entresto [Sacubitril-Valsartan] Other (See Comments)    Hypotension    Lipitor [Atorvastatin] Other (See Comments)    Myalgias    Adhesive [Tape] Rash     Social History   Socioeconomic History   Marital status: Married    Spouse name: Not on file   Number of children: Not on file   Years of education: Not on file   Highest education level: Not on file  Occupational History   Occupation: security guard    Comment: [employer]  Tobacco Use   Smoking  status: Former    Types: Cigarettes    Quit date: 03/06/1985    Years since quitting: 37.2   Smokeless tobacco: Never   Tobacco comments:    Former smoker 03/23/22  Vaping Use   Vaping Use: Never used  Substance and Sexual Activity   Alcohol use: No   Drug use: No   Sexual activity: Not Currently  Other Topics Concern   Not on file  Social History Narrative   Not on file   Social Determinants of Health   Financial Resource Strain: Low Risk  (09/06/2021)   Overall Financial Resource Strain (CARDIA)    Difficulty of Paying Living Expenses: Not very hard  Recent Concern: Financial Resource Strain - High Risk (08/31/2021)   Overall Financial Resource Strain (CARDIA)    Difficulty of Paying Living Expenses: Hard  Food Insecurity: No Food Insecurity (04/30/2022)   Hunger Vital Sign    Worried About Running Out of Food in the Last Year: Never true    Ran Out of Food in the Last Year: Never true  Transportation Needs: No Transportation Needs (04/30/2022)   PRAPARE - Hydrologist (Medical): No    Lack of Transportation (Non-Medical): No  Physical Activity: Not on  file  Stress: Not on file  Social Connections: Not on file  Intimate Partner Violence: Not At Risk (04/30/2022)   Humiliation, Afraid, Rape, and Kick questionnaire    Fear of Current or Ex-Partner: No    Emotionally Abused: No    Physically Abused: No    Sexually Abused: No    Physical Exam      Future Appointments  Date Time Provider [place]  05/18/2022  3:30 PM [facility] 1 [facility] [place]  05/20/2022 10:00 AM [facility] PA/NP [facility] None  05/26/2022 11:15 AM Gery Pray, MD [facility] None  05/30/2022  7:05 AM [facility] DEVICE REMOTES [facility]  06/03/2022  8:30 AM Evans Lance, MD [facility]  07/04/2022  9:00 AM Bensimhon, Shaune Pascal, MD [facility] None     ACTION: Home visit completed

## 2022-05-12 ENCOUNTER — Telehealth: Payer: Self-pay

## 2022-05-12 NOTE — Chronic Care Management (AMB) (Signed)
  Care Coordination   Note   05/12/2022 Name: [name] MRN: [id number] DOB: 1950-10-15  [name] is a 71 y.o. year old male who sees Koirala, Dibas, MD for primary care. I reached out to [name] by phone today to offer care coordination services.  Mr. Maddy was given information about Care Coordination services today including:   The Care Coordination services include support from the care team which includes your Nurse Coordinator, Clinical Social Worker, or Pharmacist.  The Care Coordination team is here to help remove barriers to the health concerns and goals most important to you. Care Coordination services are voluntary, and the patient may decline or stop services at any time by request to their care team member.   Care Coordination Consent Status: Patient agreed to services and verbal consent obtained.   Follow up plan:  Telephone appointment with care coordination team member scheduled for:  05/16/2022  Encounter Outcome:  Pt. Scheduled  Noreene Larsson, [place], [place] [address] Direct Dial: [phone] [email or web address]

## 2022-05-13 ENCOUNTER — Encounter (HOSPITAL_COMMUNITY): Payer: Medicare Other

## 2022-05-15 DIAGNOSIS — I130 Hypertensive heart and chronic kidney disease with heart failure and stage 1 through stage 4 chronic kidney disease, or unspecified chronic kidney disease: Secondary | ICD-10-CM | POA: Diagnosis not present

## 2022-05-15 DIAGNOSIS — Z87891 Personal history of nicotine dependence: Secondary | ICD-10-CM | POA: Diagnosis not present

## 2022-05-15 DIAGNOSIS — N183 Chronic kidney disease, stage 3 unspecified: Secondary | ICD-10-CM | POA: Diagnosis not present

## 2022-05-15 DIAGNOSIS — G4733 Obstructive sleep apnea (adult) (pediatric): Secondary | ICD-10-CM | POA: Diagnosis not present

## 2022-05-15 DIAGNOSIS — C3490 Malignant neoplasm of unspecified part of unspecified bronchus or lung: Secondary | ICD-10-CM | POA: Diagnosis not present

## 2022-05-15 DIAGNOSIS — M1990 Unspecified osteoarthritis, unspecified site: Secondary | ICD-10-CM | POA: Diagnosis not present

## 2022-05-15 DIAGNOSIS — K219 Gastro-esophageal reflux disease without esophagitis: Secondary | ICD-10-CM | POA: Diagnosis not present

## 2022-05-15 DIAGNOSIS — Z952 Presence of prosthetic heart valve: Secondary | ICD-10-CM | POA: Diagnosis not present

## 2022-05-15 DIAGNOSIS — I255 Ischemic cardiomyopathy: Secondary | ICD-10-CM | POA: Diagnosis not present

## 2022-05-15 DIAGNOSIS — E440 Moderate protein-calorie malnutrition: Secondary | ICD-10-CM | POA: Diagnosis not present

## 2022-05-15 DIAGNOSIS — E1142 Type 2 diabetes mellitus with diabetic polyneuropathy: Secondary | ICD-10-CM | POA: Diagnosis not present

## 2022-05-15 DIAGNOSIS — I447 Left bundle-branch block, unspecified: Secondary | ICD-10-CM | POA: Diagnosis not present

## 2022-05-15 DIAGNOSIS — D649 Anemia, unspecified: Secondary | ICD-10-CM | POA: Diagnosis not present

## 2022-05-15 DIAGNOSIS — I480 Paroxysmal atrial fibrillation: Secondary | ICD-10-CM | POA: Diagnosis not present

## 2022-05-15 DIAGNOSIS — N200 Calculus of kidney: Secondary | ICD-10-CM | POA: Diagnosis not present

## 2022-05-15 DIAGNOSIS — Z8673 Personal history of transient ischemic attack (TIA), and cerebral infarction without residual deficits: Secondary | ICD-10-CM | POA: Diagnosis not present

## 2022-05-15 DIAGNOSIS — R634 Abnormal weight loss: Secondary | ICD-10-CM | POA: Diagnosis not present

## 2022-05-15 DIAGNOSIS — E039 Hypothyroidism, unspecified: Secondary | ICD-10-CM | POA: Diagnosis not present

## 2022-05-15 DIAGNOSIS — E1122 Type 2 diabetes mellitus with diabetic chronic kidney disease: Secondary | ICD-10-CM | POA: Diagnosis not present

## 2022-05-15 DIAGNOSIS — I2510 Atherosclerotic heart disease of native coronary artery without angina pectoris: Secondary | ICD-10-CM | POA: Diagnosis not present

## 2022-05-15 DIAGNOSIS — I5022 Chronic systolic (congestive) heart failure: Secondary | ICD-10-CM | POA: Diagnosis not present

## 2022-05-16 ENCOUNTER — Ambulatory Visit: Payer: Self-pay | Admitting: *Deleted

## 2022-05-16 NOTE — Patient Instructions (Signed)
Visit Information  Thank you for taking time to visit with me today. Please don't hesitate to contact me if I can be of assistance to you.   Following are the goals we discussed today:   Goals Addressed               This Visit's Progress     COMPLETED: Advance Directive packet (pt-stated)        Care Coordination Interventions: Advised patient to contact provider to schedule a AWV for 2023 Provided education to patient and/or caregiver about advanced directives Reviewed scheduled/upcoming provider appointments including adherence to all scheduled appointments. Screening for signs and symptoms of depression related to chronic disease state  Assessed social determinant of health barriers          Please call the care guide team at [phone] if you need to cancel or reschedule your appointment.   If you are experiencing a Mental Health or [place] or need someone to talk to, please call the Suicide and Crisis Lifeline: 988  Patient verbalizes understanding of instructions and care plan provided today and agrees to view in [place]. Active MyChart status and patient understanding of how to access instructions and care plan via MyChart confirmed with patient.     No further follow up required: No other needs presented at this time.  Raina Mina, RN Care Management Coordinator [place] Office [phone]

## 2022-05-16 NOTE — Patient Outreach (Signed)
  Care Coordination   Initial Visit Note   05/16/2022 Name: RICKIE GANGE MRN: [id number] DOB: Dec 12, 1950  [name] is a 71 y.o. year old male who sees Koirala, Dibas, MD for primary care. I spoke with  [name] by phone today.  What matters to the patients health and wellness today?  A.D. packet    Goals Addressed               This Visit's Progress     COMPLETED: Advance Directive packet (pt-stated)        Care Coordination Interventions: Advised patient to contact provider to schedule a AWV for 2023 Provided education to patient and/or caregiver about advanced directives Reviewed scheduled/upcoming provider appointments including adherence to all scheduled appointments. Screening for signs and symptoms of depression related to chronic disease state  Assessed social determinant of health barriers  Will send A.D packet via mail-out to the address on file.        SDOH assessments and interventions completed:  Yes  SDOH Interventions Today    Flowsheet Row Most Recent Value  SDOH Interventions   Food Insecurity Interventions Intervention Not Indicated  Housing Interventions Intervention Not Indicated  Transportation Interventions Intervention Not Indicated        Care Coordination Interventions Activated:  Yes  Care Coordination Interventions:  Yes, provided   Follow up plan: No further intervention required.   Encounter Outcome:  Pt. Visit Completed   Raina Mina, RN Care Management Coordinator [place] Office [phone]

## 2022-05-18 ENCOUNTER — Ambulatory Visit (HOSPITAL_COMMUNITY): Payer: Medicare Other

## 2022-05-19 ENCOUNTER — Ambulatory Visit (HOSPITAL_COMMUNITY): Admission: RE | Admit: 2022-05-19 | Payer: Medicare Other | Source: Home / Self Care | Admitting: Surgery

## 2022-05-19 ENCOUNTER — Encounter (HOSPITAL_COMMUNITY): Admission: RE | Payer: Self-pay | Source: Home / Self Care

## 2022-05-19 SURGERY — INSERTION, CAROTID SINUS BAROREFLEX ACTIVATION DEVICE
Anesthesia: General | Laterality: Right

## 2022-05-20 ENCOUNTER — Encounter (HOSPITAL_COMMUNITY): Payer: Medicare Other

## 2022-05-24 ENCOUNTER — Telehealth: Payer: Self-pay

## 2022-05-24 NOTE — Telephone Encounter (Signed)
Called patient to ask about rescheduling his CT scan that was missed on 10/6. Per patient he is now under hospice care and does not wish to have follow up appointments.

## 2022-05-26 ENCOUNTER — Ambulatory Visit: Payer: Medicare Other | Admitting: Radiation Oncology

## 2022-05-30 ENCOUNTER — Ambulatory Visit: Payer: Medicare Other | Admitting: Internal Medicine

## 2022-05-30 ENCOUNTER — Ambulatory Visit (INDEPENDENT_AMBULATORY_CARE_PROVIDER_SITE_OTHER): Payer: Medicare Other

## 2022-05-30 DIAGNOSIS — I5022 Chronic systolic (congestive) heart failure: Secondary | ICD-10-CM

## 2022-05-30 DIAGNOSIS — Z95810 Presence of automatic (implantable) cardiac defibrillator: Secondary | ICD-10-CM

## 2022-05-31 NOTE — Progress Notes (Signed)
EPIC Encounter for ICM Monitoring  Patient Name: FELICIA BOTH is a 71 y.o. male Date: 05/31/2022 Primary Care Physican: [name], MD Primary Cardiologist: Turner/Bensimohn Electrophysiologist: Lovena Le 10/20/2021 Weight: 140 lbs 11/02/2021 Weight: 144 - 145 lbs 12/28/2021 Weight: 152 lbs 01/31/2022 Weight: 139 lbs (baseline is 143-145 lbs) 04/20/2022 Weight: 128 - 130 lbs   AT/AF Burden:  34% (taking Xarelto)                                                     Spoke with patient and heart failure questions reviewed.  Transmission results reviewed.  He reports feeling weak and leg were swollen last week.  He is currently receiving hospice.  He has only been taking 1 Torsemide tablet because 2 tablets were making him urinate so much.    CorVue thoracic impedance suggesting possible fluid accumulation starting 10/7.   Prescribed: Torsemide 20 mg take 2 tablets (40 mg total) daily.   Potassium 20 mEq take 1 tablet by mouth daily.  Spironolactone 25 mg take 1 tablet by mouth daily. Farxiga 10 mg take 1 tablet by mouth daily.   Labs: 04/12/2022 Creatinine 1.30, BUN 31, Potassium 5.1, Sodium 135,  GFR 58 03/30/2022 Creatinine 1.29, BUN 42, Potassium 4.1, Sodium 138,  GFR 59  03/08/2022 Creatinine 1.12, BUN 22, Potassium 46, Sodium 138,   GFR >60  12/07/2021 Creatinine 1.20, BUN 17, Potassium 4.8, Sodium 137,  GFR >60 10/21/2021 Creatinine 1.18, BUN 21, Potassium 4.0, Sodium 137,  GFR >60 A complete set of results can be found in Results Review.   Recommendations:  Advised to take Torsemide 2 tablets as prescribed for next 2 days.     Follow-up plan: ICM clinic phone appointment on 06/06/2022 to recheck fluid levels.  91 day device clinic remote transmission 05/11/2022.     EP/Cardiology Office Visits:  07/04/2022 with Advanced HF clinic.   06/03/2022 with Dr Lovena Le.     Copy of ICM check sent to Dr. Lovena Le.  3 month ICM trend: 05/29/2022.    [name], RN 05/31/2022 3:16  PM

## 2022-05-31 NOTE — Progress Notes (Addendum)
Pt was able to send updated report for 10/17.  Reviewed transmission results.   Pt self adjusted Torsemide and decreased dosage to 1 tablet 20 mg daily for past 2-3 weeks because it was making him urinate so much.  Advised he should  be taking 2 tablets 40 mg daily and he should at least take the 40 mg for next 2 days to help resolve fluid accumulation.    Will recheck fluid levels on 06/06/2022.  He currently does not have any fluid symptoms but he had leg swelling last week.  He has hospice nurse coming to the home.    Copy sent to Dr Haroldine Laws for review.    05/31/2022 [facility] thoracic impedance suggesting possible fluid accumulation starting 05/21/2022.   AT/AF Burden 30%.

## 2022-06-03 ENCOUNTER — Ambulatory Visit: Payer: Medicare Other | Attending: Internal Medicine | Admitting: Internal Medicine

## 2022-06-06 ENCOUNTER — Ambulatory Visit (INDEPENDENT_AMBULATORY_CARE_PROVIDER_SITE_OTHER): Payer: Medicare Other

## 2022-06-06 DIAGNOSIS — I5022 Chronic systolic (congestive) heart failure: Secondary | ICD-10-CM

## 2022-06-06 DIAGNOSIS — Z95810 Presence of automatic (implantable) cardiac defibrillator: Secondary | ICD-10-CM

## 2022-06-06 NOTE — Progress Notes (Signed)
EPIC Encounter for ICM Monitoring  Patient Name: [name] is a 71 y.o. male Date: 06/06/2022 Primary Care Physican: [name], MD Primary Cardiologist: Turner/[facility]imohn Electrophysiologist: Lovena Le 10/20/2021 Weight: 140 lbs 11/02/2021 Weight: 144 - 145 lbs 12/28/2021 Weight: 152 lbs 01/31/2022 Weight: 139 lbs (baseline is 143-145 lbs) 04/20/2022 Weight: 128 - 130 lbs   AT/AF Burden:  34% (taking Xarelto)                                                     Spoke with patient and heart failure questions reviewed.  Transmission results reviewed.  Pt asymptomatic for fluid accumulation.   He is currently receiving hospice.  His sister is coming to visit for a week (hasn't seen in 12 years)   [facility] thoracic impedance suggesting fluid levels returned to normal after recommended to take prescribed Torsemide dosage of 40 mg x 2 days.   Prescribed: Torsemide 20 mg take 2 tablets (40 mg total) daily.  Reports he only takes 1 tablet due to 2 tablets makes him urinate so much.   Potassium 20 mEq take 1 tablet by mouth daily.  Spironolactone 25 mg take 1 tablet by mouth daily. Farxiga 10 mg take 1 tablet by mouth daily.   Labs: 04/12/2022 Creatinine 1.30, BUN 31, Potassium 5.1, Sodium 135,  GFR 58 03/30/2022 Creatinine 1.29, BUN 42, Potassium 4.1, Sodium 138,  GFR 59  03/08/2022 Creatinine 1.12, BUN 22, Potassium 46, Sodium 138,   GFR >60  12/07/2021 Creatinine 1.20, BUN 17, Potassium 4.8, Sodium 137,  GFR >60 10/21/2021 Creatinine 1.18, BUN 21, Potassium 4.0, Sodium 137,  GFR >60 A complete set of results can be found in Results Review.   Recommendations:  Advised if taking 2 Torsemide tablets daily is too much for him then at least try to take a couple of days a week to help manage fluid levels.       Follow-up plan: ICM clinic phone appointment on 07/04/2022.  91 day device clinic remote transmission 08/10/2022.     EP/Cardiology Office Visits:  07/04/2022 with [facility].       Copy of ICM check sent to Dr. Lovena Le.  3 month ICM trend: 06/03/2022.    [name], RN 06/06/2022 9:24 AM

## 2022-06-15 DIAGNOSIS — E1142 Type 2 diabetes mellitus with diabetic polyneuropathy: Secondary | ICD-10-CM | POA: Diagnosis not present

## 2022-06-15 DIAGNOSIS — Z952 Presence of prosthetic heart valve: Secondary | ICD-10-CM | POA: Diagnosis not present

## 2022-06-15 DIAGNOSIS — E039 Hypothyroidism, unspecified: Secondary | ICD-10-CM | POA: Diagnosis not present

## 2022-06-15 DIAGNOSIS — E440 Moderate protein-calorie malnutrition: Secondary | ICD-10-CM | POA: Diagnosis not present

## 2022-06-15 DIAGNOSIS — K219 Gastro-esophageal reflux disease without esophagitis: Secondary | ICD-10-CM | POA: Diagnosis not present

## 2022-06-15 DIAGNOSIS — I130 Hypertensive heart and chronic kidney disease with heart failure and stage 1 through stage 4 chronic kidney disease, or unspecified chronic kidney disease: Secondary | ICD-10-CM | POA: Diagnosis not present

## 2022-06-15 DIAGNOSIS — I480 Paroxysmal atrial fibrillation: Secondary | ICD-10-CM | POA: Diagnosis not present

## 2022-06-15 DIAGNOSIS — I255 Ischemic cardiomyopathy: Secondary | ICD-10-CM | POA: Diagnosis not present

## 2022-06-15 DIAGNOSIS — D649 Anemia, unspecified: Secondary | ICD-10-CM | POA: Diagnosis not present

## 2022-06-15 DIAGNOSIS — G4733 Obstructive sleep apnea (adult) (pediatric): Secondary | ICD-10-CM | POA: Diagnosis not present

## 2022-06-15 DIAGNOSIS — I2510 Atherosclerotic heart disease of native coronary artery without angina pectoris: Secondary | ICD-10-CM | POA: Diagnosis not present

## 2022-06-15 DIAGNOSIS — M1990 Unspecified osteoarthritis, unspecified site: Secondary | ICD-10-CM | POA: Diagnosis not present

## 2022-06-15 DIAGNOSIS — N183 Chronic kidney disease, stage 3 unspecified: Secondary | ICD-10-CM | POA: Diagnosis not present

## 2022-06-15 DIAGNOSIS — E1122 Type 2 diabetes mellitus with diabetic chronic kidney disease: Secondary | ICD-10-CM | POA: Diagnosis not present

## 2022-06-15 DIAGNOSIS — I5022 Chronic systolic (congestive) heart failure: Secondary | ICD-10-CM | POA: Diagnosis not present

## 2022-06-15 DIAGNOSIS — N200 Calculus of kidney: Secondary | ICD-10-CM | POA: Diagnosis not present

## 2022-06-15 DIAGNOSIS — I447 Left bundle-branch block, unspecified: Secondary | ICD-10-CM | POA: Diagnosis not present

## 2022-06-15 DIAGNOSIS — Z87891 Personal history of nicotine dependence: Secondary | ICD-10-CM | POA: Diagnosis not present

## 2022-06-15 DIAGNOSIS — Z8673 Personal history of transient ischemic attack (TIA), and cerebral infarction without residual deficits: Secondary | ICD-10-CM | POA: Diagnosis not present

## 2022-06-15 DIAGNOSIS — R634 Abnormal weight loss: Secondary | ICD-10-CM | POA: Diagnosis not present

## 2022-06-15 DIAGNOSIS — C3490 Malignant neoplasm of unspecified part of unspecified bronchus or lung: Secondary | ICD-10-CM | POA: Diagnosis not present

## 2022-06-20 DIAGNOSIS — R404 Transient alteration of awareness: Secondary | ICD-10-CM | POA: Diagnosis not present

## 2022-06-20 DIAGNOSIS — R531 Weakness: Secondary | ICD-10-CM | POA: Diagnosis not present

## 2022-06-20 DIAGNOSIS — Z7401 Bed confinement status: Secondary | ICD-10-CM | POA: Diagnosis not present

## 2022-07-02 NOTE — Progress Notes (Signed)
Patient did not show for appt

## 2022-07-04 ENCOUNTER — Inpatient Hospital Stay (HOSPITAL_COMMUNITY): Admission: RE | Admit: 2022-07-04 | Payer: Medicare Other | Source: Ambulatory Visit | Admitting: Internal Medicine

## 2022-07-15 DEATH — deceased

## 2022-07-28 ENCOUNTER — Other Ambulatory Visit (HOSPITAL_COMMUNITY): Payer: Self-pay

## 2023-11-10 IMAGING — CR DG CHEST 2V
2 series · 2 of 2 positions shown · non-contrast
Comparison: Chest x-ray 08/30/2021

CLINICAL DATA: Shortness of breath

EXAM:
CHEST - 2 VIEW

[chest pa]
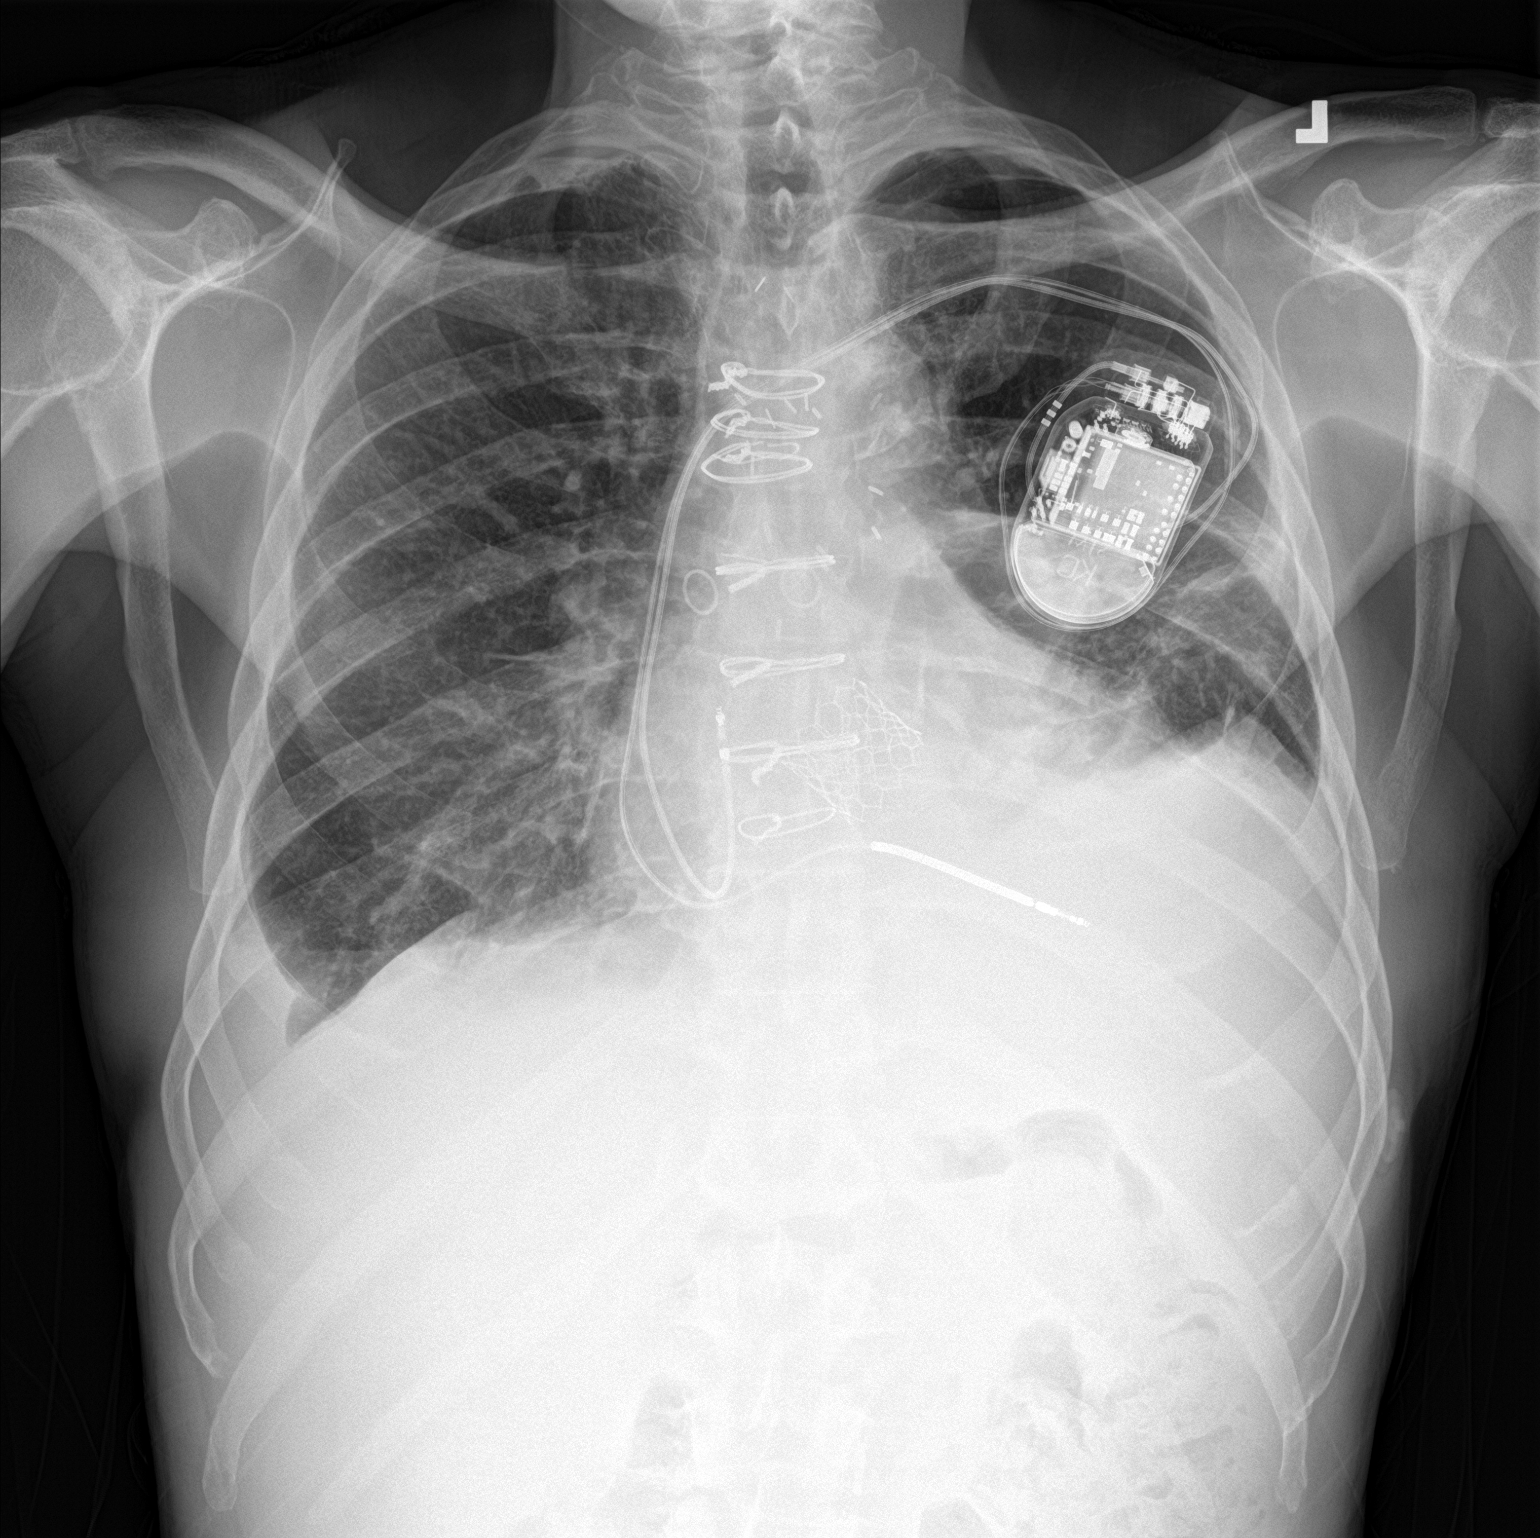

[chest lat]
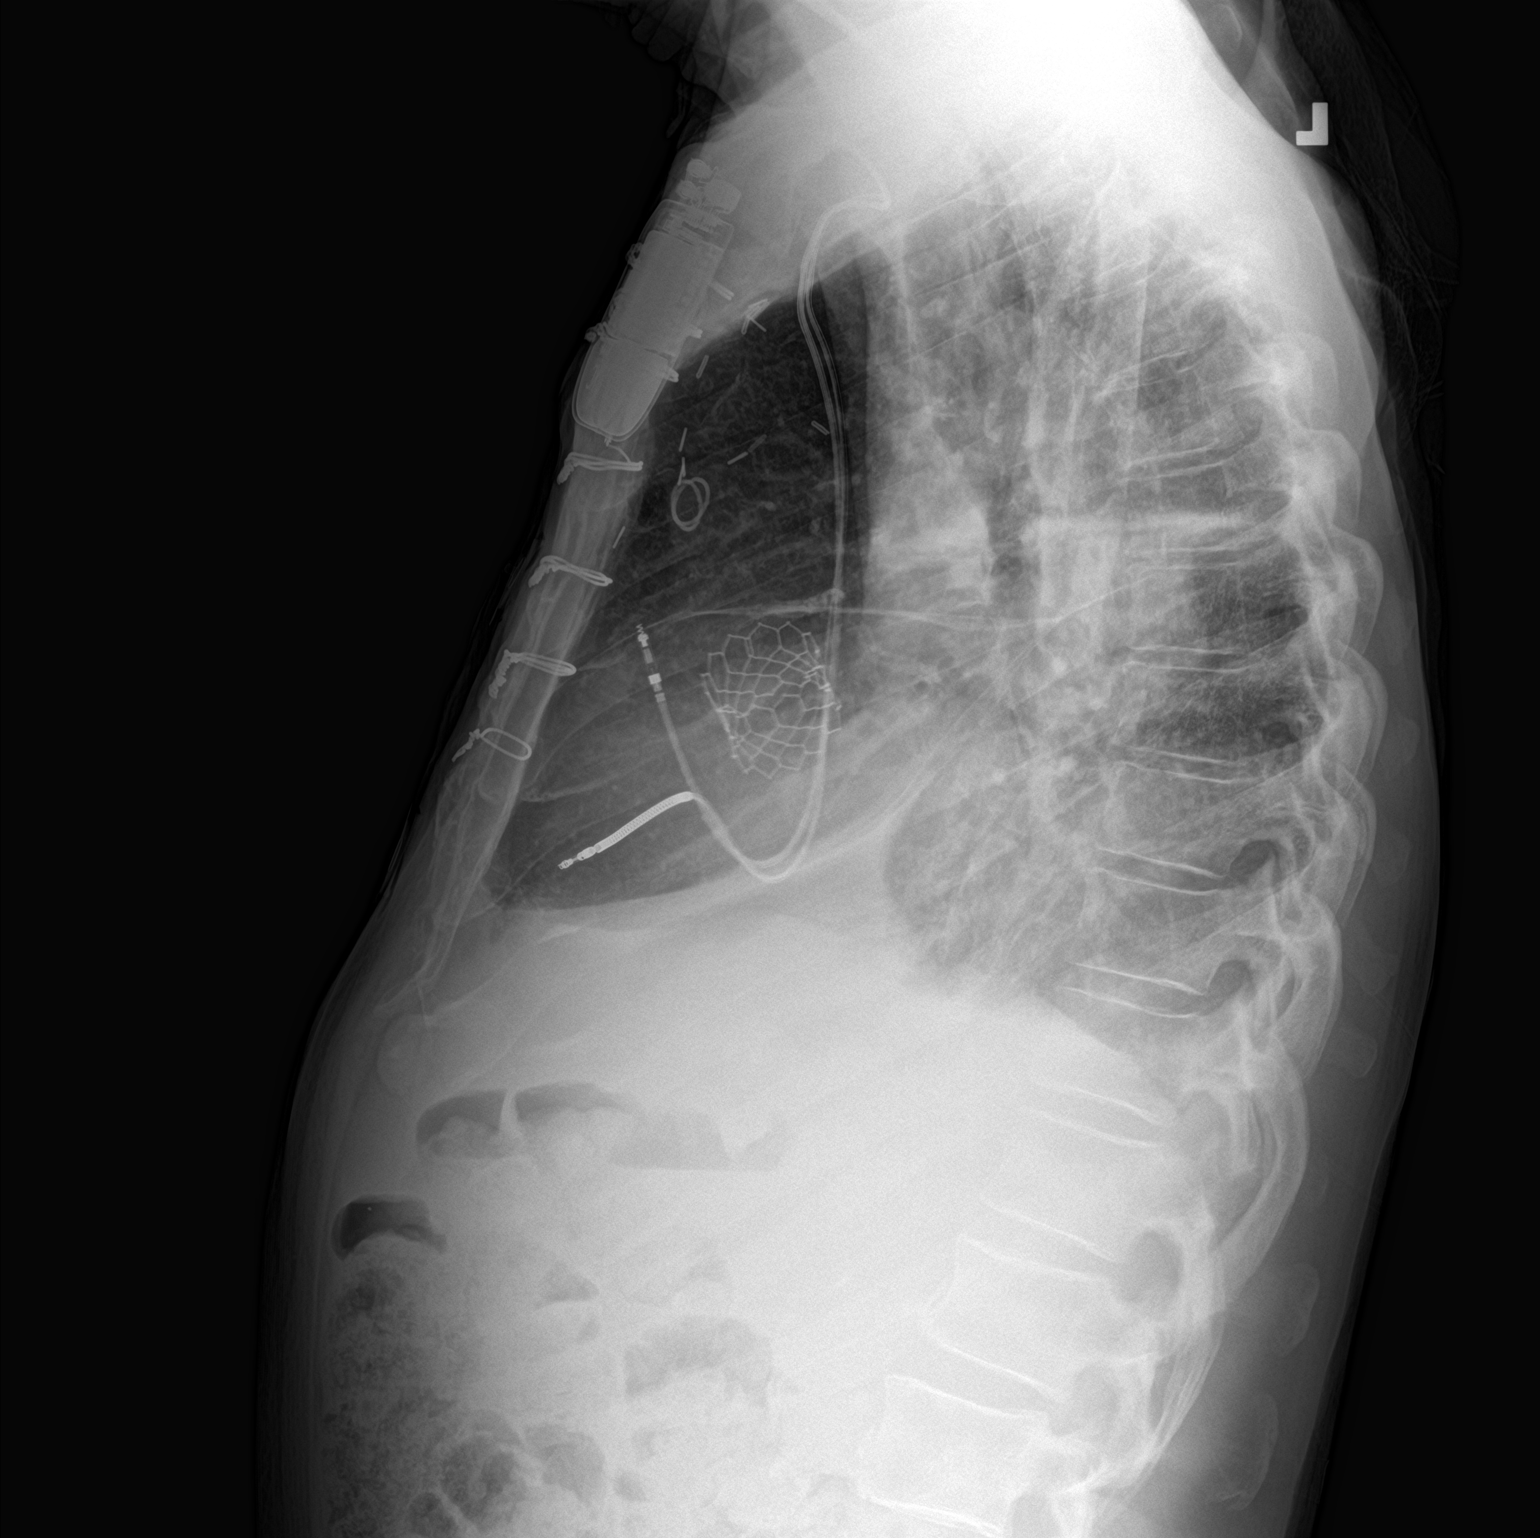

[2 of 2 positions shown; findings below may reference images not displayed]

FINDINGS: Heart size is normal. Mediastinum appears stable. Cardiac surgical
changes and median sternotomy wires. Left-sided cardiac pacemaker
device. Pulmonary vasculature is within normal limits. Stable
appearing elevated left hemidiaphragm and adjacent opacities in the
left lower lung zone which may represent scarring/atelectasis.
Stable small right pleural effusion. Right apical pleural
thickening. No pneumothorax visualized.
IMPRESSION: No change since previous study. Small right pleural effusion,
elevated left hemidiaphragm and adjacent likely scarring/atelectatic
changes in the left lower lung zone.
